# Patient Record
Sex: Female | Born: 1955
Health system: Southern US, Community
[De-identification: ages and names within clinical notes are randomized; demographics above are authoritative.]

## PROBLEM LIST (undated history)

## (undated) DIAGNOSIS — I4891 Unspecified atrial fibrillation: Secondary | ICD-10-CM

## (undated) DIAGNOSIS — N289 Disorder of kidney and ureter, unspecified: Secondary | ICD-10-CM

## (undated) DIAGNOSIS — I319 Disease of pericardium, unspecified: Secondary | ICD-10-CM

## (undated) DIAGNOSIS — K5900 Constipation, unspecified: Secondary | ICD-10-CM

## (undated) DIAGNOSIS — M25475 Effusion, left foot: Secondary | ICD-10-CM

## (undated) DIAGNOSIS — K829 Disease of gallbladder, unspecified: Secondary | ICD-10-CM

## (undated) DIAGNOSIS — M25474 Effusion, right foot: Secondary | ICD-10-CM

## (undated) DIAGNOSIS — E785 Hyperlipidemia, unspecified: Secondary | ICD-10-CM

## (undated) DIAGNOSIS — M25471 Effusion, right ankle: Secondary | ICD-10-CM

## (undated) DIAGNOSIS — I2510 Atherosclerotic heart disease of native coronary artery without angina pectoris: Secondary | ICD-10-CM

## (undated) DIAGNOSIS — I340 Nonrheumatic mitral (valve) insufficiency: Secondary | ICD-10-CM

## (undated) DIAGNOSIS — I252 Old myocardial infarction: Secondary | ICD-10-CM

## (undated) DIAGNOSIS — I9789 Other postprocedural complications and disorders of the circulatory system, not elsewhere classified: Secondary | ICD-10-CM

## (undated) DIAGNOSIS — M549 Dorsalgia, unspecified: Secondary | ICD-10-CM

## (undated) DIAGNOSIS — R0602 Shortness of breath: Secondary | ICD-10-CM

## (undated) DIAGNOSIS — M1990 Unspecified osteoarthritis, unspecified site: Secondary | ICD-10-CM

## (undated) DIAGNOSIS — J189 Pneumonia, unspecified organism: Secondary | ICD-10-CM

## (undated) DIAGNOSIS — R112 Nausea with vomiting, unspecified: Secondary | ICD-10-CM

## (undated) DIAGNOSIS — E039 Hypothyroidism, unspecified: Secondary | ICD-10-CM

## (undated) DIAGNOSIS — I219 Acute myocardial infarction, unspecified: Secondary | ICD-10-CM

## (undated) DIAGNOSIS — Z9889 Other specified postprocedural states: Secondary | ICD-10-CM

## (undated) DIAGNOSIS — L930 Discoid lupus erythematosus: Secondary | ICD-10-CM

## (undated) DIAGNOSIS — I10 Essential (primary) hypertension: Secondary | ICD-10-CM

## (undated) DIAGNOSIS — R5383 Other fatigue: Secondary | ICD-10-CM

## (undated) DIAGNOSIS — I080 Rheumatic disorders of both mitral and aortic valves: Secondary | ICD-10-CM

## (undated) DIAGNOSIS — D693 Immune thrombocytopenic purpura: Secondary | ICD-10-CM

## (undated) DIAGNOSIS — K219 Gastro-esophageal reflux disease without esophagitis: Secondary | ICD-10-CM

## (undated) DIAGNOSIS — M2550 Pain in unspecified joint: Secondary | ICD-10-CM

## (undated) DIAGNOSIS — I5030 Unspecified diastolic (congestive) heart failure: Secondary | ICD-10-CM

## (undated) DIAGNOSIS — G4730 Sleep apnea, unspecified: Secondary | ICD-10-CM

## (undated) DIAGNOSIS — M25472 Effusion, left ankle: Secondary | ICD-10-CM

## (undated) DIAGNOSIS — R079 Chest pain, unspecified: Secondary | ICD-10-CM

## (undated) DIAGNOSIS — R7303 Prediabetes: Secondary | ICD-10-CM

## (undated) HISTORY — DX: Essential (primary) hypertension: I10

## (undated) HISTORY — DX: Effusion, right ankle: M25.471

## (undated) HISTORY — DX: Effusion, left foot: M25.475

## (undated) HISTORY — DX: Hyperlipidemia, unspecified: E78.5

## (undated) HISTORY — DX: Disease of gallbladder, unspecified: K82.9

## (undated) HISTORY — DX: Rheumatic disorders of both mitral and aortic valves: I08.0

## (undated) HISTORY — DX: Dorsalgia, unspecified: M54.9

## (undated) HISTORY — DX: Gastro-esophageal reflux disease without esophagitis: K21.9

## (undated) HISTORY — DX: Pain in unspecified joint: M25.50

## (undated) HISTORY — DX: Shortness of breath: R06.02

## (undated) HISTORY — DX: Old myocardial infarction: I25.2

## (undated) HISTORY — DX: Hypothyroidism, unspecified: E03.9

## (undated) HISTORY — DX: Chest pain, unspecified: R07.9

## (undated) HISTORY — DX: Sleep apnea, unspecified: G47.30

## (undated) HISTORY — DX: Discoid lupus erythematosus: L93.0

## (undated) HISTORY — DX: Constipation, unspecified: K59.00

## (undated) HISTORY — PX: OTHER SURGICAL HISTORY: SHX169

## (undated) HISTORY — DX: Disease of pericardium, unspecified: I31.9

## (undated) HISTORY — DX: Unspecified diastolic (congestive) heart failure: I50.30

## (undated) HISTORY — DX: Disorder of kidney and ureter, unspecified: N28.9

## (undated) HISTORY — DX: Immune thrombocytopenic purpura: D69.3

## (undated) HISTORY — DX: Effusion, right foot: M25.474

## (undated) HISTORY — DX: Effusion, left ankle: M25.472

## (undated) HISTORY — DX: Other fatigue: R53.83

---

## 1999-05-01 ENCOUNTER — Other Ambulatory Visit: Admission: RE | Admit: 1999-05-01 | Discharge: 1999-05-01 | Payer: Self-pay | Admitting: Emergency Medicine

## 1999-09-13 ENCOUNTER — Emergency Department (HOSPITAL_COMMUNITY): Admission: EM | Admit: 1999-09-13 | Discharge: 1999-09-14 | Payer: Self-pay | Admitting: Emergency Medicine

## 1999-09-14 ENCOUNTER — Encounter: Payer: Self-pay | Admitting: Emergency Medicine

## 1999-09-16 ENCOUNTER — Encounter: Payer: Self-pay | Admitting: *Deleted

## 1999-09-16 ENCOUNTER — Inpatient Hospital Stay (HOSPITAL_COMMUNITY): Admission: EM | Admit: 1999-09-16 | Discharge: 1999-09-18 | Payer: Self-pay | Admitting: Emergency Medicine

## 2000-03-08 ENCOUNTER — Emergency Department (HOSPITAL_COMMUNITY): Admission: EM | Admit: 2000-03-08 | Discharge: 2000-03-09 | Payer: Self-pay | Admitting: Emergency Medicine

## 2000-06-08 ENCOUNTER — Encounter: Payer: Self-pay | Admitting: Emergency Medicine

## 2000-06-08 ENCOUNTER — Emergency Department (HOSPITAL_COMMUNITY): Admission: EM | Admit: 2000-06-08 | Discharge: 2000-06-08 | Payer: Self-pay | Admitting: Emergency Medicine

## 2001-04-10 ENCOUNTER — Emergency Department (HOSPITAL_COMMUNITY): Admission: EM | Admit: 2001-04-10 | Discharge: 2001-04-10 | Payer: Self-pay

## 2001-04-10 ENCOUNTER — Encounter: Payer: Self-pay | Admitting: Emergency Medicine

## 2001-04-11 ENCOUNTER — Inpatient Hospital Stay (HOSPITAL_COMMUNITY): Admission: EM | Admit: 2001-04-11 | Discharge: 2001-04-17 | Payer: Self-pay | Admitting: Cardiology

## 2001-04-13 ENCOUNTER — Encounter: Payer: Self-pay | Admitting: Cardiology

## 2001-04-27 ENCOUNTER — Encounter (HOSPITAL_COMMUNITY): Admission: RE | Admit: 2001-04-27 | Discharge: 2001-07-26 | Payer: Self-pay | Admitting: Family Medicine

## 2001-10-27 ENCOUNTER — Other Ambulatory Visit: Admission: RE | Admit: 2001-10-27 | Discharge: 2001-10-27 | Payer: Self-pay | Admitting: Obstetrics and Gynecology

## 2001-12-27 ENCOUNTER — Encounter: Payer: Self-pay | Admitting: Emergency Medicine

## 2001-12-27 ENCOUNTER — Inpatient Hospital Stay (HOSPITAL_COMMUNITY): Admission: EM | Admit: 2001-12-27 | Discharge: 2001-12-29 | Payer: Self-pay | Admitting: Emergency Medicine

## 2002-01-07 ENCOUNTER — Ambulatory Visit (HOSPITAL_COMMUNITY): Admission: RE | Admit: 2002-01-07 | Discharge: 2002-01-08 | Payer: Self-pay | Admitting: Cardiology

## 2002-03-16 ENCOUNTER — Inpatient Hospital Stay (HOSPITAL_COMMUNITY): Admission: EM | Admit: 2002-03-16 | Discharge: 2002-03-18 | Payer: Self-pay | Admitting: Emergency Medicine

## 2002-03-18 ENCOUNTER — Encounter (HOSPITAL_COMMUNITY): Payer: Self-pay | Admitting: Oncology

## 2002-04-19 ENCOUNTER — Observation Stay (HOSPITAL_COMMUNITY): Admission: EM | Admit: 2002-04-19 | Discharge: 2002-04-20 | Payer: Self-pay | Admitting: Internal Medicine

## 2002-05-25 ENCOUNTER — Encounter: Payer: Self-pay | Admitting: Emergency Medicine

## 2002-05-26 ENCOUNTER — Inpatient Hospital Stay (HOSPITAL_COMMUNITY): Admission: EM | Admit: 2002-05-26 | Discharge: 2002-06-04 | Payer: Self-pay | Admitting: Emergency Medicine

## 2002-05-28 ENCOUNTER — Encounter: Payer: Self-pay | Admitting: Internal Medicine

## 2002-06-03 ENCOUNTER — Encounter (INDEPENDENT_AMBULATORY_CARE_PROVIDER_SITE_OTHER): Payer: Self-pay | Admitting: Cardiology

## 2002-06-07 ENCOUNTER — Emergency Department (HOSPITAL_COMMUNITY): Admission: EM | Admit: 2002-06-07 | Discharge: 2002-06-07 | Payer: Self-pay | Admitting: Emergency Medicine

## 2002-06-07 ENCOUNTER — Encounter: Payer: Self-pay | Admitting: Emergency Medicine

## 2002-06-10 ENCOUNTER — Emergency Department (HOSPITAL_COMMUNITY): Admission: EM | Admit: 2002-06-10 | Discharge: 2002-06-11 | Payer: Self-pay | Admitting: Emergency Medicine

## 2002-06-13 ENCOUNTER — Inpatient Hospital Stay (HOSPITAL_COMMUNITY): Admission: EM | Admit: 2002-06-13 | Discharge: 2002-06-21 | Payer: Self-pay

## 2002-06-14 ENCOUNTER — Encounter: Payer: Self-pay | Admitting: Internal Medicine

## 2002-06-17 ENCOUNTER — Encounter: Payer: Self-pay | Admitting: Internal Medicine

## 2002-06-18 ENCOUNTER — Encounter: Payer: Self-pay | Admitting: Internal Medicine

## 2002-06-20 ENCOUNTER — Encounter (INDEPENDENT_AMBULATORY_CARE_PROVIDER_SITE_OTHER): Payer: Self-pay | Admitting: *Deleted

## 2002-06-20 ENCOUNTER — Encounter: Payer: Self-pay | Admitting: Pulmonary Disease

## 2002-06-21 ENCOUNTER — Encounter (INDEPENDENT_AMBULATORY_CARE_PROVIDER_SITE_OTHER): Payer: Self-pay | Admitting: Specialist

## 2002-07-23 HISTORY — PX: SPLENECTOMY: SUR1306

## 2002-08-04 ENCOUNTER — Encounter: Payer: Self-pay | Admitting: General Surgery

## 2002-08-08 ENCOUNTER — Encounter: Payer: Self-pay | Admitting: General Surgery

## 2002-08-08 ENCOUNTER — Encounter: Admission: RE | Admit: 2002-08-08 | Discharge: 2002-08-08 | Payer: Self-pay | Admitting: General Surgery

## 2002-08-09 ENCOUNTER — Ambulatory Visit (HOSPITAL_COMMUNITY): Admission: RE | Admit: 2002-08-09 | Discharge: 2002-08-09 | Payer: Self-pay | Admitting: *Deleted

## 2002-08-10 ENCOUNTER — Encounter (INDEPENDENT_AMBULATORY_CARE_PROVIDER_SITE_OTHER): Payer: Self-pay | Admitting: *Deleted

## 2002-08-10 ENCOUNTER — Inpatient Hospital Stay (HOSPITAL_COMMUNITY): Admission: RE | Admit: 2002-08-10 | Discharge: 2002-08-12 | Payer: Self-pay | Admitting: General Surgery

## 2002-09-29 ENCOUNTER — Emergency Department (HOSPITAL_COMMUNITY): Admission: EM | Admit: 2002-09-29 | Discharge: 2002-09-29 | Payer: Self-pay | Admitting: Emergency Medicine

## 2003-06-27 ENCOUNTER — Encounter: Admission: RE | Admit: 2003-06-27 | Discharge: 2003-06-27 | Payer: Self-pay | Admitting: Cardiology

## 2003-06-30 ENCOUNTER — Ambulatory Visit (HOSPITAL_COMMUNITY): Admission: RE | Admit: 2003-06-30 | Discharge: 2003-06-30 | Payer: Self-pay | Admitting: Cardiology

## 2004-02-22 ENCOUNTER — Ambulatory Visit: Payer: Self-pay | Admitting: Internal Medicine

## 2004-03-01 ENCOUNTER — Ambulatory Visit: Payer: Self-pay | Admitting: Oncology

## 2004-05-02 ENCOUNTER — Emergency Department (HOSPITAL_COMMUNITY): Admission: EM | Admit: 2004-05-02 | Discharge: 2004-05-02 | Payer: Self-pay | Admitting: Emergency Medicine

## 2004-07-22 ENCOUNTER — Ambulatory Visit: Payer: Self-pay | Admitting: Internal Medicine

## 2004-07-29 ENCOUNTER — Ambulatory Visit: Payer: Self-pay | Admitting: *Deleted

## 2004-08-22 ENCOUNTER — Encounter: Admission: RE | Admit: 2004-08-22 | Discharge: 2004-08-22 | Payer: Self-pay | Admitting: Internal Medicine

## 2004-11-07 ENCOUNTER — Encounter: Admission: RE | Admit: 2004-11-07 | Discharge: 2004-11-07 | Payer: Self-pay | Admitting: Cardiology

## 2005-05-19 ENCOUNTER — Ambulatory Visit: Payer: Self-pay | Admitting: Gastroenterology

## 2005-05-26 ENCOUNTER — Emergency Department (HOSPITAL_COMMUNITY): Admission: EM | Admit: 2005-05-26 | Discharge: 2005-05-27 | Payer: Self-pay | Admitting: Emergency Medicine

## 2005-05-26 ENCOUNTER — Ambulatory Visit: Payer: Self-pay | Admitting: Internal Medicine

## 2005-05-27 ENCOUNTER — Ambulatory Visit: Payer: Self-pay | Admitting: Gastroenterology

## 2005-05-28 ENCOUNTER — Inpatient Hospital Stay (HOSPITAL_COMMUNITY): Admission: EM | Admit: 2005-05-28 | Discharge: 2005-05-31 | Payer: Self-pay | Admitting: Emergency Medicine

## 2005-05-28 ENCOUNTER — Ambulatory Visit: Payer: Self-pay | Admitting: Internal Medicine

## 2005-05-29 ENCOUNTER — Encounter (INDEPENDENT_AMBULATORY_CARE_PROVIDER_SITE_OTHER): Payer: Self-pay | Admitting: Cardiology

## 2005-05-29 ENCOUNTER — Encounter: Payer: Self-pay | Admitting: Cardiovascular Disease

## 2005-06-05 ENCOUNTER — Ambulatory Visit: Payer: Self-pay | Admitting: Internal Medicine

## 2005-11-06 ENCOUNTER — Other Ambulatory Visit: Admission: RE | Admit: 2005-11-06 | Discharge: 2005-11-06 | Payer: Self-pay | Admitting: Obstetrics and Gynecology

## 2006-02-15 ENCOUNTER — Emergency Department (HOSPITAL_COMMUNITY): Admission: EM | Admit: 2006-02-15 | Discharge: 2006-02-15 | Payer: Self-pay | Admitting: Emergency Medicine

## 2006-03-10 ENCOUNTER — Encounter: Admission: RE | Admit: 2006-03-10 | Discharge: 2006-03-10 | Payer: Self-pay | Admitting: Obstetrics and Gynecology

## 2006-11-30 ENCOUNTER — Encounter: Payer: Self-pay | Admitting: Cardiovascular Disease

## 2006-11-30 ENCOUNTER — Observation Stay (HOSPITAL_COMMUNITY): Admission: EM | Admit: 2006-11-30 | Discharge: 2006-11-30 | Payer: Self-pay | Admitting: Emergency Medicine

## 2006-12-09 ENCOUNTER — Encounter: Payer: Self-pay | Admitting: Cardiovascular Disease

## 2007-08-05 ENCOUNTER — Ambulatory Visit: Payer: Self-pay | Admitting: Internal Medicine

## 2007-08-05 DIAGNOSIS — I82409 Acute embolism and thrombosis of unspecified deep veins of unspecified lower extremity: Secondary | ICD-10-CM | POA: Insufficient documentation

## 2007-08-05 DIAGNOSIS — D693 Immune thrombocytopenic purpura: Secondary | ICD-10-CM | POA: Insufficient documentation

## 2007-08-05 DIAGNOSIS — I319 Disease of pericardium, unspecified: Secondary | ICD-10-CM | POA: Insufficient documentation

## 2007-08-05 DIAGNOSIS — K219 Gastro-esophageal reflux disease without esophagitis: Secondary | ICD-10-CM | POA: Insufficient documentation

## 2007-08-05 DIAGNOSIS — M3214 Glomerular disease in systemic lupus erythematosus: Secondary | ICD-10-CM | POA: Insufficient documentation

## 2007-08-05 DIAGNOSIS — I739 Peripheral vascular disease, unspecified: Secondary | ICD-10-CM | POA: Insufficient documentation

## 2007-08-06 ENCOUNTER — Telehealth: Payer: Self-pay | Admitting: Family Medicine

## 2007-11-24 ENCOUNTER — Encounter: Payer: Self-pay | Admitting: Cardiovascular Disease

## 2007-11-24 ENCOUNTER — Encounter: Payer: Self-pay | Admitting: Internal Medicine

## 2007-12-02 ENCOUNTER — Encounter: Payer: Self-pay | Admitting: Internal Medicine

## 2007-12-02 ENCOUNTER — Encounter: Payer: Self-pay | Admitting: Cardiovascular Disease

## 2007-12-09 ENCOUNTER — Ambulatory Visit: Payer: Self-pay | Admitting: Internal Medicine

## 2007-12-09 DIAGNOSIS — Z9089 Acquired absence of other organs: Secondary | ICD-10-CM | POA: Insufficient documentation

## 2007-12-09 DIAGNOSIS — F17200 Nicotine dependence, unspecified, uncomplicated: Secondary | ICD-10-CM | POA: Insufficient documentation

## 2007-12-09 DIAGNOSIS — R05 Cough: Secondary | ICD-10-CM

## 2007-12-09 DIAGNOSIS — R059 Cough, unspecified: Secondary | ICD-10-CM | POA: Insufficient documentation

## 2007-12-09 LAB — CONVERTED CEMR LAB
Inflenza A Ag: NEGATIVE
Influenza B Ag: NEGATIVE

## 2007-12-10 ENCOUNTER — Encounter: Payer: Self-pay | Admitting: Internal Medicine

## 2008-01-21 ENCOUNTER — Ambulatory Visit: Payer: Self-pay | Admitting: Internal Medicine

## 2008-03-03 ENCOUNTER — Ambulatory Visit: Payer: Self-pay | Admitting: Internal Medicine

## 2008-03-06 ENCOUNTER — Ambulatory Visit: Payer: Self-pay | Admitting: Internal Medicine

## 2008-03-06 LAB — CONVERTED CEMR LAB
BUN: 19 mg/dL (ref 6–23)
CO2: 29 meq/L (ref 19–32)
Calcium: 9.1 mg/dL (ref 8.4–10.5)
Chloride: 109 meq/L (ref 96–112)
Creatinine, Ser: 1 mg/dL (ref 0.4–1.2)
GFR calc Af Amer: 75 mL/min
GFR calc non Af Amer: 62 mL/min
Glucose, Bld: 89 mg/dL (ref 70–99)
Potassium: 4.7 meq/L (ref 3.5–5.1)
Sodium: 143 meq/L (ref 135–145)

## 2008-03-08 ENCOUNTER — Telehealth (INDEPENDENT_AMBULATORY_CARE_PROVIDER_SITE_OTHER): Payer: Self-pay | Admitting: *Deleted

## 2008-03-09 ENCOUNTER — Encounter: Admission: RE | Admit: 2008-03-09 | Discharge: 2008-03-09 | Payer: Self-pay | Admitting: Internal Medicine

## 2008-03-24 HISTORY — PX: CHOLECYSTECTOMY, LAPAROSCOPIC: SHX56

## 2008-05-11 ENCOUNTER — Telehealth: Payer: Self-pay | Admitting: Internal Medicine

## 2008-05-12 ENCOUNTER — Ambulatory Visit: Payer: Self-pay | Admitting: Endocrinology

## 2008-07-11 ENCOUNTER — Telehealth: Payer: Self-pay | Admitting: Internal Medicine

## 2008-08-16 ENCOUNTER — Ambulatory Visit: Payer: Self-pay | Admitting: Internal Medicine

## 2008-08-28 ENCOUNTER — Ambulatory Visit: Payer: Self-pay | Admitting: Internal Medicine

## 2008-09-13 ENCOUNTER — Telehealth: Payer: Self-pay | Admitting: Internal Medicine

## 2008-09-20 ENCOUNTER — Encounter: Payer: Self-pay | Admitting: Internal Medicine

## 2008-09-26 ENCOUNTER — Telehealth (INDEPENDENT_AMBULATORY_CARE_PROVIDER_SITE_OTHER): Payer: Self-pay | Admitting: *Deleted

## 2008-10-24 ENCOUNTER — Ambulatory Visit: Payer: Self-pay | Admitting: Internal Medicine

## 2008-10-24 DIAGNOSIS — D259 Leiomyoma of uterus, unspecified: Secondary | ICD-10-CM | POA: Insufficient documentation

## 2008-12-14 ENCOUNTER — Telehealth: Payer: Self-pay | Admitting: Internal Medicine

## 2009-01-05 ENCOUNTER — Ambulatory Visit (HOSPITAL_COMMUNITY): Admission: RE | Admit: 2009-01-05 | Discharge: 2009-01-05 | Payer: Self-pay | Admitting: Internal Medicine

## 2009-01-16 ENCOUNTER — Ambulatory Visit: Payer: Self-pay | Admitting: Internal Medicine

## 2009-01-23 ENCOUNTER — Telehealth: Payer: Self-pay | Admitting: Internal Medicine

## 2009-01-23 ENCOUNTER — Encounter: Payer: Self-pay | Admitting: Internal Medicine

## 2009-01-26 ENCOUNTER — Ambulatory Visit: Payer: Self-pay | Admitting: Cardiovascular Disease

## 2009-01-26 DIAGNOSIS — E785 Hyperlipidemia, unspecified: Secondary | ICD-10-CM | POA: Insufficient documentation

## 2009-01-26 DIAGNOSIS — E782 Mixed hyperlipidemia: Secondary | ICD-10-CM | POA: Insufficient documentation

## 2009-02-13 ENCOUNTER — Encounter: Payer: Self-pay | Admitting: Internal Medicine

## 2009-02-16 ENCOUNTER — Encounter: Payer: Self-pay | Admitting: Cardiovascular Disease

## 2009-02-16 ENCOUNTER — Ambulatory Visit: Payer: Self-pay

## 2009-02-16 ENCOUNTER — Ambulatory Visit: Payer: Self-pay | Admitting: Internal Medicine

## 2009-02-16 ENCOUNTER — Ambulatory Visit (HOSPITAL_COMMUNITY): Admission: RE | Admit: 2009-02-16 | Discharge: 2009-02-16 | Payer: Self-pay | Admitting: Cardiovascular Disease

## 2009-02-21 ENCOUNTER — Encounter: Payer: Self-pay | Admitting: Cardiovascular Disease

## 2009-02-26 ENCOUNTER — Ambulatory Visit: Payer: Self-pay | Admitting: Cardiovascular Disease

## 2009-02-26 DIAGNOSIS — I25709 Atherosclerosis of coronary artery bypass graft(s), unspecified, with unspecified angina pectoris: Secondary | ICD-10-CM | POA: Insufficient documentation

## 2009-02-26 DIAGNOSIS — I059 Rheumatic mitral valve disease, unspecified: Secondary | ICD-10-CM | POA: Insufficient documentation

## 2009-02-26 LAB — CONVERTED CEMR LAB
ALT: 13 units/L (ref 0–35)
AST: 19 units/L (ref 0–37)
Albumin: 3.4 g/dL — ABNORMAL LOW (ref 3.5–5.2)
Alkaline Phosphatase: 83 units/L (ref 39–117)
Bilirubin, Direct: 0 mg/dL (ref 0.0–0.3)
Cholesterol: 150 mg/dL (ref 0–200)
HDL: 39.4 mg/dL (ref >39.00)
LDL Cholesterol: 94 mg/dL (ref 0–99)
Total Bilirubin: 0.5 mg/dL (ref 0.3–1.2)
Total CHOL/HDL Ratio: 4
Total CK: 94 units/L (ref 7–177)
Total Protein: 6.8 g/dL (ref 6.0–8.3)
Triglycerides: 83 mg/dL (ref 0.0–149.0)
VLDL: 16.6 mg/dL (ref 0.0–40.0)

## 2009-03-02 ENCOUNTER — Ambulatory Visit (HOSPITAL_COMMUNITY): Admission: RE | Admit: 2009-03-02 | Discharge: 2009-03-03 | Payer: Self-pay | Admitting: General Surgery

## 2009-03-02 ENCOUNTER — Encounter (INDEPENDENT_AMBULATORY_CARE_PROVIDER_SITE_OTHER): Payer: Self-pay | Admitting: General Surgery

## 2009-03-08 ENCOUNTER — Encounter: Payer: Self-pay | Admitting: Internal Medicine

## 2009-03-27 ENCOUNTER — Telehealth (INDEPENDENT_AMBULATORY_CARE_PROVIDER_SITE_OTHER): Payer: Self-pay | Admitting: *Deleted

## 2009-03-28 ENCOUNTER — Telehealth (INDEPENDENT_AMBULATORY_CARE_PROVIDER_SITE_OTHER): Payer: Self-pay | Admitting: *Deleted

## 2009-04-23 ENCOUNTER — Telehealth: Payer: Self-pay | Admitting: Internal Medicine

## 2009-05-19 ENCOUNTER — Emergency Department (HOSPITAL_COMMUNITY): Admission: EM | Admit: 2009-05-19 | Discharge: 2009-05-19 | Payer: Self-pay | Admitting: Emergency Medicine

## 2009-05-29 ENCOUNTER — Ambulatory Visit: Payer: Self-pay | Admitting: Internal Medicine

## 2009-05-29 DIAGNOSIS — J019 Acute sinusitis, unspecified: Secondary | ICD-10-CM | POA: Insufficient documentation

## 2009-05-29 DIAGNOSIS — B9689 Other specified bacterial agents as the cause of diseases classified elsewhere: Secondary | ICD-10-CM | POA: Insufficient documentation

## 2009-05-29 DIAGNOSIS — N390 Urinary tract infection, site not specified: Secondary | ICD-10-CM | POA: Insufficient documentation

## 2009-05-29 LAB — CONVERTED CEMR LAB
Bilirubin Urine: NEGATIVE
Hemoglobin, Urine: NEGATIVE
Leukocytes, UA: NEGATIVE
Nitrite: NEGATIVE
Specific Gravity, Urine: 1.015 (ref 1.000–1.030)
Total Protein, Urine: NEGATIVE mg/dL
Urine Glucose: NEGATIVE mg/dL
Urobilinogen, UA: 0.2 (ref 0.0–1.0)
pH: 6.5 (ref 5.0–8.0)

## 2009-06-21 ENCOUNTER — Telehealth (INDEPENDENT_AMBULATORY_CARE_PROVIDER_SITE_OTHER): Payer: Self-pay | Admitting: *Deleted

## 2009-06-26 ENCOUNTER — Ambulatory Visit: Payer: Self-pay | Admitting: Internal Medicine

## 2009-08-01 ENCOUNTER — Telehealth: Payer: Self-pay | Admitting: Internal Medicine

## 2009-09-12 ENCOUNTER — Telehealth: Payer: Self-pay | Admitting: Internal Medicine

## 2009-09-20 ENCOUNTER — Ambulatory Visit: Payer: Self-pay | Admitting: Cardiovascular Disease

## 2009-09-21 LAB — CONVERTED CEMR LAB
ALT: 16 units/L (ref 0–35)
AST: 22 units/L (ref 0–37)
Albumin: 3.5 g/dL (ref 3.5–5.2)
Alkaline Phosphatase: 82 units/L (ref 39–117)
Bilirubin, Direct: 0.1 mg/dL (ref 0.0–0.3)
Total Bilirubin: 0.2 mg/dL — ABNORMAL LOW (ref 0.3–1.2)
Total Protein: 7.1 g/dL (ref 6.0–8.3)

## 2010-02-26 ENCOUNTER — Encounter: Payer: Self-pay | Admitting: Cardiovascular Disease

## 2010-02-26 ENCOUNTER — Ambulatory Visit: Payer: Self-pay | Admitting: Cardiovascular Disease

## 2010-02-27 LAB — CONVERTED CEMR LAB
ALT: 17 units/L (ref 0–35)
AST: 22 units/L (ref 0–37)
Albumin: 3.4 g/dL — ABNORMAL LOW (ref 3.5–5.2)
Alkaline Phosphatase: 77 units/L (ref 39–117)
Bilirubin, Direct: 0 mg/dL (ref 0.0–0.3)
Cholesterol: 137 mg/dL (ref 0–200)
HDL: 44.1 mg/dL (ref >39.00)
LDL Cholesterol: 82 mg/dL (ref 0–99)
Total Bilirubin: 0.3 mg/dL (ref 0.3–1.2)
Total CHOL/HDL Ratio: 3
Total Protein: 6.5 g/dL (ref 6.0–8.3)
Triglycerides: 55 mg/dL (ref 0.0–149.0)
VLDL: 11 mg/dL (ref 0.0–40.0)

## 2010-04-03 ENCOUNTER — Ambulatory Visit: Admit: 2010-04-03 | Payer: Self-pay

## 2010-04-03 ENCOUNTER — Ambulatory Visit (HOSPITAL_COMMUNITY): Admission: RE | Admit: 2010-04-03 | Payer: Self-pay | Source: Home / Self Care | Admitting: Cardiovascular Disease

## 2010-04-14 ENCOUNTER — Encounter: Payer: Self-pay | Admitting: Obstetrics and Gynecology

## 2010-04-14 ENCOUNTER — Encounter (HOSPITAL_COMMUNITY): Payer: Self-pay | Admitting: Oncology

## 2010-04-23 NOTE — Progress Notes (Signed)
  Phone Note Refill Request Message from:  Fax from Pharmacy on September 12, 2009 3:14 PM  Refills Requested: Medication #1:  PROTONIX 40 MG  TBEC Take 1 tablet by mouth once a day Initial call taken by: Ami Bullins CMA,  September 12, 2009 3:14 PM    Prescriptions: PROTONIX 40 MG  TBEC (PANTOPRAZOLE SODIUM) Take 1 tablet by mouth once a day  #30 Tablet x 3   Entered by:   Ami Bullins CMA   Authorized by:   Jacques Navy MD   Signed by:   Bill Salinas CMA on 09/12/2009   Method used:   [facility]  [address] [id number]       [address]       [place], [place]  [address]       Ph: [phone]       Fax: [phone]   RxID:   [id number]

## 2010-04-23 NOTE — Progress Notes (Signed)
  Phone Note Call from Patient Call back at [phone] cell   Caller: Patient Summary of Call: paged at 6:17 pm on 08-05-07 about meds. Was expecting a refill on her Prednisone to be called to the pharmacy but it was not. I told her to contact Dr. Debby Bud in the am. Initial call taken by: Nelwyn Salisbury MD,  Aug 06, 2007 8:22 AM

## 2010-04-23 NOTE — Progress Notes (Signed)
   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 [name]  June 21, 2009 3:39 PM    Appended Document:  Recieved back today paper from [facility] sent back to [facility].Marland KitchenMarland Kitchen

## 2010-04-23 NOTE — Progress Notes (Signed)
Summary: appt  Phone Note Call from Patient Call back at Home Phone [phone]   Caller: Patient Summary of Call: Patient called c/o congestion and said that Dr Debby Bud told her that she does not have a spleen and need to always be seen. Pt added to schedule fot 05/12/08 Initial call taken by: Rock Nephew CMA,  May 11, 2008 2:50 PM

## 2010-04-23 NOTE — Progress Notes (Signed)
Summary: Request for MD to complete [employer] questionaire  Request for MD to complete [employer] questionaire.Forwarded to Dr. Clifton James. Korinne Greenstein  June 21, 2009 10:34 AM  Appended Document: Request for MD to complete [employer] questionaire Request for MD to complete [employer] questionaire. Forwarded to Dr. Debby Bud.

## 2010-04-23 NOTE — Progress Notes (Signed)
Summary: [facility]  Request for records received from [facility]. Request forwarded to [facility]. Dena Petti  March 27, 2009 2:48 PM

## 2010-04-23 NOTE — Progress Notes (Signed)
Summary: levothyroxine  Phone Note Refill Request Message from:  Fax from Pharmacy on September 13, 2008 4:24 PM  Refills Requested: Medication #1:  LEVOTHYROXINE SODIUM 50 MCG TABS Take 1 tablet by mouth once a day   [facility]: [facility] [phone]   Method Requested: Electronic Initial call taken by: Orlan Leavens,  September 13, 2008 4:24 PM      Prescriptions: LEVOTHYROXINE SODIUM 50 MCG TABS (LEVOTHYROXINE SODIUM) Take 1 tablet by mouth once a day  #30 x 1   Entered by:   Orlan Leavens   Authorized by:   Jacques Navy MD   Signed by:   Orlan Leavens on 09/13/2008   Method used:   [facility]        [facility]  [address] [phone][facility] (retail[facility]       [address]       [place], [place]  [address]       Ph: [phone]       Fax: [phone]   RxID:   [id number]

## 2010-04-23 NOTE — Assessment & Plan Note (Signed)
Summary: PER CHECK OUT/SF  Medications Added CRESTOR 10 MG TABS (ROSUVASTATIN CALCIUM) Take one tablet by mouth daily.        Visit Type:  6 mo f/u Primary Provider:  Jacques Navy MD  CC:  edema/ankles at times....no cp or sob.  History of Present Illness: 55 yo AAF with h/o CAD s/p bare metal stent RCA and Circumflex 2003, PTCA Diagonal 2003, hypothyroidism, moderate MR, GERD here today for cardiac follow up. She had previously been followed in [facility] clinic by Dr. Elsie Lincoln.  I saw her as a new patient in November 2010. She has had some chest pain at times since her stents were placed in 2003.Chest pain has been much less often and less intense.   She denies near syncope or syncope. She has changed her diet to fruits and vegetables. She has been exercising three times per week. No SOB with exertion. She has almost totally stopped smoking. Some mild edema ankles. She recently stopped Zocor because of muscle aches.   Her echo in November 2010 showed normal LV size and function.  There was mild to moderate TR and MR. Lower ext dopplers with no evidence of PAD.   Current Medications (verified): 1)  Atenolol 25 Mg  Tabs (Atenolol) .... Take 1 Tablet By Mouth Once A Day 2)  Protonix 40 Mg  Tbec (Pantoprazole Sodium) .... Take 1 Tablet By Mouth Once A Day 3)  Nitroglycerin 0.4 Mg  Subl (Nitroglycerin) .... As Needed 4)  Levothyroxine Sodium 50 Mcg Tabs (Levothyroxine Sodium) .... Take 1 Tablet By Mouth Once A Day 5)  Aspirin 325 Mg Tabs (Aspirin) .... One By Mouth Daily  Allergies: 1)  ! Ace Inhibitors  Past History:  Past Medical History: Reviewed history from 01/26/2009 and no changes required. Mixed connective tissue disorder/SLE Hx of DVT (ICD-453.40) '04 PVD (ICD-443.9) GERD (ICD-530.81) MITRAL REGURGITATION, 2-3 PLUS (ICD-396.3) Hx of IMMUNE THROMBOCYTOPENIC PURPURA  '04 Hx of PERICARDITIS (ICD-423.9) CORONARY ARTERY DISEASE, S/P PTCA (ICD-414.9) with stents  in mid Circumflex and mid RCA in 2003, and balloon angioplasty of Diagonal branch October 2003,  last cath Sept '08 2 vessel disease with patent stents LUPUS ERYTHEMATOSUS (ICD-695.4) Hypothyroidism    Physician Roster:           Card- Dr. Elsie Lincoln           Rheum - Dr. Link Snuffer           Heme - Dr. Arline Asp  Social History: Reviewed history from 02/26/2009 and no changes required. In college [name] ('09)-Undergraduate Religious Studies , graduated from [employer] in [place] state. Marrried '73 - 2 years/ divorced. 2 son- '73- CP, '75;  Occupation:administrator for nonprofit, part-time student Pt is a smoker-1/2 pack per day x 30 years No illlicit drug use No etoh  Review of Systems       The patient complains of chest pain and shortness of breath.  The patient denies fatigue, malaise, fever, weight gain/loss, vision loss, decreased hearing, hoarseness, palpitations, prolonged cough, wheezing, sleep apnea, coughing up blood, abdominal pain, blood in stool, nausea, vomiting, diarrhea, heartburn, incontinence, blood in urine, muscle weakness, joint pain, leg swelling, rash, skin lesions, headache, fainting, dizziness, depression, anxiety, enlarged lymph nodes, easy bruising or bleeding, and environmental allergies.    Vital Signs:  Patient profile:   55 year old female Height:      67 inches Weight:      213 pounds BMI:     33.48 Pulse rate:  76 / minute Pulse rhythm:   regular BP sitting:   120 / 80  (left arm) Cuff size:   large  Vitals Entered By: Danielle Rankin, CMA (September 20, 2009 3:38 PM)  Physical Exam  General:  General: Well developed, well nourished, NAD Musculoskeletal: Muscle strength 5/5 all ext Psychiatric: Mood and affect normal Neck: No JVD, no carotid bruits, no thyromegaly, no lymphadenopathy. Lungs:Clear bilaterally, no wheezes, rhonci, crackles CV: RRR no murmurs, gallops rubs Abdomen: soft, NT, ND, BS present Extremities: No edema, pulses  2+.    Impression & Recommendations:  Problem # 1:  CORONARY ATHEROSCLEROSIS NATIVE CORONARY ARTERY (ICD-414.01) Stable. Will continue ASA, beta blocker. Will start Crestor 10 mg by mouth at bedtime. I will give her samples. If she tolerates, will call in refill. Check LFTs today. Repeat LFTS and fasting lipids in 12 weeks.   Her updated medication list for this problem includes:    Atenolol 25 Mg Tabs (Atenolol) .Marland Kitchen... Take 1 tablet by mouth once a day    Nitroglycerin 0.4 Mg Subl (Nitroglycerin) .Marland Kitchen... As needed    Aspirin 325 Mg Tabs (Aspirin) ..... One by mouth daily  Her updated medication list for this problem includes:    Atenolol 25 Mg Tabs (Atenolol) .Marland Kitchen... Take 1 tablet by mouth once a day    Nitroglycerin 0.4 Mg Subl (Nitroglycerin) .Marland Kitchen... As needed    Aspirin 325 Mg Tabs (Aspirin) ..... One by mouth daily  Problem # 2:  MITRAL VALVE DISORDERS (ICD-424.0) Repeat echo in 6 months. Will order at next visit.   Her updated medication list for this problem includes:    Atenolol 25 Mg Tabs (Atenolol) .Marland Kitchen... Take 1 tablet by mouth once a day    Nitroglycerin 0.4 Mg Subl (Nitroglycerin) .Marland Kitchen... As needed  Her updated medication list for this problem includes:    Atenolol 25 Mg Tabs (Atenolol) .Marland Kitchen... Take 1 tablet by mouth once a day    Nitroglycerin 0.4 Mg Subl (Nitroglycerin) .Marland Kitchen... As needed  Problem # 3:  PURE HYPERCHOLESTEROLEMIA (ICD-272.0) As above. Start Crestor and check lipids in 12 weeks.   The following medications were removed from the medication list:    Simvastatin 40 Mg Tabs (Simvastatin) .Marland Kitchen... Take one tablet by mouth daily at bedtime Her updated medication list for this problem includes:    Crestor 10 Mg Tabs (Rosuvastatin calcium) .Marland Kitchen... Take one tablet by mouth daily.  The following medications were removed from the medication list:    Simvastatin 40 Mg Tabs (Simvastatin) .Marland Kitchen... Take one tablet by mouth daily at bedtime Her updated medication list for this problem  includes:    Crestor 10 Mg Tabs (Rosuvastatin calcium) .Marland Kitchen... Take one tablet by mouth daily.  Other Orders: TLB-Hepatic/Liver Function Pnl (80076-HEPATIC)  Patient Instructions: 1)  Your physician recommends that you schedule a follow-up appointment in: 6 months 2)  Your physician recommends that you return for a FASTING lipid profile and liver profile in 12 weeks 3)  Your physician has recommended you make the following change in your medication: Start crestor 10 mg by mouth daily Prescriptions: CRESTOR 10 MG TABS (ROSUVASTATIN CALCIUM) Take one tablet by mouth daily.  #30 x 11   Entered by:   Dossie Arbour, RN, BSN   Authorized by:   Verne Carrow, MD   Signed by:   Dossie Arbour, RN, BSN on 09/20/2009   Method used:   [facility]        [facility]  [address] [phone][facility] (retail[facility]  [address]       [place], [place]  [address]       Ph: [phone]       Fax: [phone]   RxID:   [id number]

## 2010-04-23 NOTE — Letter (Signed)
Summary: Results Follow-up Letter  [facility]  [address] [place], [place] [address]   Phone: [phone]  Fax: [phone]    12/10/2007  [address] [place], [place]  [address]  Dear Ms. Primeau,   The following are the results of your recent test(s):  Test     Result     Chest Xray        Normal   _________________________________________________________  Please call for an appointment if needed _________________________________________________________ _________________________________________________________ _________________________________________________________  Sincerely,  Sanda Linger MD [facility]

## 2010-04-23 NOTE — Assessment & Plan Note (Signed)
Summary: post wes long er/told to f/u asap after completion of med for...   Vital Signs:  Patient profile:   55 year old female Height:      67 inches (170.18 cm) Weight:      210.50 pounds (95.68 kg) BMI:     33.09 O2 Sat:      99 % on Room air Temp:     98.3 degrees F (36.83 degrees C) oral Pulse rate:   77 / minute Pulse rhythm:   regular Resp:     16 per minute BP sitting:   130 / 82  (left arm) Cuff size:   large  Vitals Entered By: Brenton Grills (May 29, 2009 10:26 AM)  Nutrition Counseling: Patient's BMI is greater than 25 and therefore counseled on weight management options.  O2 Flow:  Room air CC: post  hosp f/u [facility] Patience, URI symptoms   Primary Care Provider:  Jacques Navy MD  CC:  post  hosp f/u Rickard Patience and URI symptoms.  History of Present Illness: New to me she needs a f/up after being treated for right pyelonephritis at the hospital with Cipro.  She also complains of sinus congestion.  URI Symptoms      This is a 55 year old woman who presents with URI symptoms.  The symptoms began 2 weeks ago.  The severity is described as moderate.  The patient reports nasal congestion and purulent nasal discharge, but denies sore throat, dry cough, productive cough, earache, and sick contacts.  The patient denies fever, stiff neck, dyspnea, wheezing, rash, vomiting, diarrhea, use of an antipyretic, and response to antipyretic.  The patient denies seasonal symptoms, headache, muscle aches, and severe fatigue.  Risk factors for Strep sinusitis include unilateral facial pain, unilateral nasal discharge, poor response to decongestant, double sickening, and absence of cough.  The patient denies the following risk factors for Strep sinusitis: tooth pain, Strep exposure, and tender adenopathy.    Preventive Screening-Counseling & Management  Alcohol-Tobacco     Alcohol drinks/day: 0     Smoking Status: current     Smoking Cessation Counseling: yes     Smoke  Cessation Stage: precontemplative     Packs/Day: 1     Passive Smoke Exposure: no  Hep-HIV-STD-Contraception     Hepatitis Risk: no risk noted     HIV Risk: no     STD Risk: no risk noted      Drug Use:  no.    Medications Prior to Update: 1)  Altace 1.25 Mg  Tabs (Ramipril) .... Take 1 Tablet By Mouth Once A Day 2)  Atenolol 25 Mg  Tabs (Atenolol) .... Take 1 Tablet By Mouth Once A Day 3)  Protonix 40 Mg  Tbec (Pantoprazole Sodium) .... Take 1 Tablet By Mouth Once A Day 4)  Simvastatin 40 Mg Tabs (Simvastatin) .... Take One Tablet By Mouth Daily At Bedtime 5)  Nitroglycerin 0.4 Mg  Subl (Nitroglycerin) .... As Needed 6)  Levothyroxine Sodium 50 Mcg Tabs (Levothyroxine Sodium) .... Take 1 Tablet By Mouth Once A Day 7)  Aspirin 325 Mg Tabs (Aspirin) .... One By Mouth Daily  Current Medications (verified): 1)  Atenolol 25 Mg  Tabs (Atenolol) .... Take 1 Tablet By Mouth Once A Day 2)  Protonix 40 Mg  Tbec (Pantoprazole Sodium) .... Take 1 Tablet By Mouth Once A Day 3)  Simvastatin 40 Mg Tabs (Simvastatin) .... Take One Tablet By Mouth Daily At Bedtime 4)  Nitroglycerin 0.4  Mg  Subl (Nitroglycerin) .... As Needed 5)  Levothyroxine Sodium 50 Mcg Tabs (Levothyroxine Sodium) .... Take 1 Tablet By Mouth Once A Day 6)  Aspirin 325 Mg Tabs (Aspirin) .... One By Mouth Daily 7)  Amoxicillin 500 Mg Cap (Amoxicillin) .... Take 1 Capsule By Mouth Three Times A Day X 10 Days  Allergies (verified): 1)  ! Ace Inhibitors  Past History:  Past Medical History: Reviewed history from 01/26/2009 and no changes required. Mixed connective tissue disorder/SLE Hx of DVT (ICD-453.40) '04 PVD (ICD-443.9) GERD (ICD-530.81) MITRAL REGURGITATION, 2-3 PLUS (ICD-396.3) Hx of IMMUNE THROMBOCYTOPENIC PURPURA  '04 Hx of PERICARDITIS (ICD-423.9) CORONARY ARTERY DISEASE, S/P PTCA (ICD-414.9) with stents in mid Circumflex and mid RCA in 2003, and balloon angioplasty of Diagonal branch October 2003,  last cath Sept  '08 2 vessel disease with patent stents LUPUS ERYTHEMATOSUS (ICD-695.4) Hypothyroidism    Physician Roster:           Card- Dr. Elsie Lincoln           Rheum - Dr. Link Snuffer           Heme - Dr. Arline Asp  Past Surgical History: Reviewed history from 08/16/2008 and no changes required. plastic surgical repair dog bite to leg splenectomy (ITP) 5/04 G2P2  Family History: Reviewed history from 01/26/2009 and no changes required. mother- breast cancer, cervical cancer Father alive at 56 Grandmother died from MI PGF-colon cancer Pos - CAD, DM, HTN, Lipids  Social History: Reviewed history from 02/26/2009 and no changes required. In college [name] ('09)-Undergraduate Religious Studies , graduated from [employer] in [place]. Marrried '73 - 2 years/ divorced. 2 son- '73- CP, '75;  Occupation:administrator for nonprofit, part-time student Pt is a smoker-1/2 pack per day x 30 days No illlicit drug use No etoh   Hepatitis Risk:  no risk noted STD Risk:  no risk noted  Review of Systems       The patient complains of prolonged cough.  The patient denies anorexia, fever, weight loss, weight gain, hoarseness, chest pain, dyspnea on exertion, peripheral edema, headaches, hemoptysis, abdominal pain, melena, hematochezia, severe indigestion/heartburn, hematuria, incontinence, genital sores, and suspicious skin lesions.   Resp:  Complains of cough; denies coughing up blood, excessive snoring, hypersomnolence, morning headaches, pleuritic, shortness of breath, sputum productive, and wheezing. GU:  Denies abnormal vaginal bleeding, discharge, dysuria, hematuria, nocturia, urinary frequency, and urinary hesitancy.  Physical Exam  General:  alert, well-developed, well-nourished, well-hydrated, appropriate dress, normal appearance, healthy-appearing, and overweight-appearing.   Head:  normocephalic, atraumatic, no abnormalities observed, and no abnormalities palpated.   Ears:  R ear  normal and L ear normal.   Nose:  no airflow obstruction, no intranasal foreign body, no nasal polyps, no nasal mucosal lesions, no mucosal friability, no active bleeding or clots, no septum abnormalities, nasal dischargemucosal pallor, L maxillary sinus tenderness, and R maxillary sinus tenderness.   Mouth:  Oral mucosa and oropharynx without lesions or exudates.  Teeth in good repair. Neck:  No deformities, masses, or tenderness noted. Chest Wall:  No deformities, masses, or tenderness noted. Lungs:  normal respiratory effort, no intercostal retractions, no accessory muscle use, normal breath sounds, no dullness, no fremitus, no crackles, and no wheezes.   Heart:  normal rate, regular rhythm, no murmur, no gallop, no rub, and no JVD.   Abdomen:  soft, non-tender, normal bowel sounds, no distention, no masses, no guarding, no rigidity, no hepatomegaly, and no splenomegaly.  No CVAT. Msk:  No deformity or scoliosis  noted of thoracic or lumbar spine.   Pulses:  R and L carotid,radial,femoral,dorsalis pedis and posterior tibial pulses are full and equal bilaterally Extremities:  No clubbing, cyanosis, edema, or deformity noted with normal full range of motion of all joints.   Neurologic:  No cranial nerve deficits noted. Station and gait are normal. Plantar reflexes are down-going bilaterally. DTRs are symmetrical throughout. Sensory, motor and coordinative functions appear intact. Skin:  Intact without suspicious lesions or rashes Cervical Nodes:  no anterior cervical adenopathy and no posterior cervical adenopathy.   Axillary Nodes:  no R axillary adenopathy and no L axillary adenopathy.   Psych:  Cognition and judgment appear intact. Alert and cooperative with normal attention span and concentration. No apparent delusions, illusions, hallucinations   Impression & Recommendations:  Problem # 1:  UTI (ICD-599.0) Assessment Improved  Orders: T-Urine Culture (Spectrum Order)  ([id number]) TLB-Udip w/ Micro (81001-URINE)  Encouraged to push clear liquids, get enough rest, and take acetaminophen as needed. To be seen in 10 days if no improvement, sooner if worse.  Problem # 2:  SINUSITIS- ACUTE-NOS (ICD-461.9) Assessment: New  Her updated medication list for this problem includes:    Amoxicillin 500 Mg Cap (Amoxicillin) .Marland Kitchen... Take 1 capsule by mouth three times a day x 10 days  Instructed on treatment. Call if symptoms persist or worsen.   Orders: Tobacco use cessation intermediate 3-10 minutes ([address]11)  Problem # 3:  COUGH (ICD-786.2) Assessment: Deteriorated  stop the ACEI  Orders: Tobacco use cessation intermediate 3-10 minutes ([address])  Complete Medication List: 1)  Atenolol 25 Mg Tabs (Atenolol) .... Take 1 tablet by mouth once a day 2)  Protonix 40 Mg Tbec (Pantoprazole sodium) .... Take 1 tablet by mouth once a day 3)  Simvastatin 40 Mg Tabs (Simvastatin) .... Take one tablet by mouth daily at bedtime 4)  Nitroglycerin 0.4 Mg Subl (Nitroglycerin) .... As needed 5)  Levothyroxine Sodium 50 Mcg Tabs (Levothyroxine sodium) .... Take 1 tablet by mouth once a day 6)  Aspirin 325 Mg Tabs (Aspirin) .... One by mouth daily 7)  Amoxicillin 500 Mg Cap (Amoxicillin) .... Take 1 capsule by mouth three times a day x 10 days  Patient Instructions: 1)  Please schedule a follow-up appointment in 2 weeks. 2)  It is important that you exercise regularly at least 20 minutes 5 times a week. If you develop chest pain, have severe difficulty breathing, or feel very tired , stop exercising immediately and seek medical attention. 3)  You need to lose weight. Consider a lower calorie diet and regular exercise.  4)  Check your Blood Pressure regularly. If it is above 140/90: you should make an appointment. 5)  Take your antibiotic as prescribed until ALL of it is gone, but stop if you develop a rash or swelling and contact our office as soon as possible. 6)  Acute  sinusitis symptoms for less than 10 days are not helped by antibiotics.Use warm moist compresses, and over the counter decongestants ( only as directed). Call if no improvement in 5-7 days, sooner if increasing pain, fever, or new symptoms. Prescriptions: AMOXICILLIN 500 MG CAP (AMOXICILLIN) Take 1 capsule by mouth three times a day X 10 days  #30 x 1   Entered and Authorized by:   Etta Grandchild MD   Signed by:   Etta Grandchild MD on 05/29/2009   Method used:   [facility]        [facility]  [address] [phone][facility] (  retail[facility]       [address]       [place], [place]  [address]       Ph: [phone]       Fax: [phone]   RxID:   [id number]

## 2010-04-23 NOTE — Assessment & Plan Note (Signed)
Summary: fever/cough FLU RM SD   Vital Signs:  Patient Profile:   55 Years Old Female Height:     67 inches Temp:     97.3 degrees F oral Pulse rate:   64 / minute Pulse rhythm:   regular BP sitting:   104 / 72  (left arm) Cuff size:   regular  Vitals Entered By: Rock Nephew CMA (December 09, 2007 10:26 AM)                 PCP:  [facility]ins  Chief Complaint:  congestion, headache, nausea, and fever x 4 days.  History of Present Illness: this is a new patient to me being seen with the acute onset of a cough 4 days ago. She states the cough had a very slow gradual onset. There was nothing abrupt about it. She thought at first it was allergies. She said she had a temperature last night of 102 but that has since resolved without any antipyretic therapy. She has a nonproductive cough and has had no chest pain. She has had some headache, nausea, fatigue but no abdominal pain. She had a splenectomy in 2005 due to ITP and at that time received a Pneumovax.    Past Medical History:    Reviewed history from 08/05/2007 and no changes required:       UCD       Hx of DVT (ICD-453.40) '04       PVD (ICD-443.9)       GERD (ICD-530.81)       MITRAL REGURGITATION, 2-3 PLUS (ICD-396.3)       Hx of IMMUNE THROMBOCYTOPENIC PURPURA (ICD-287.31) '04       Hx of PERICARDITIS (ICD-423.9)       CORONARY ARTERY DISEASE, S/P PTCA (ICD-414.9) last cath Sept '08 2 vessel disease       LUPUS ERYTHEMATOSUS (ICD-695.4)                            Physician Roster:                 Card- Dr. Elsie Lincoln                 Rheum - Dr. Link Snuffer                 Heme - Dr. Arline Asp  Past Surgical History:    Reviewed history from 08/05/2007 and no changes required:       plastic surgical repair dog bite to leg       splenectomy (ITP) '04       G2P2   Family History:    Reviewed history from 08/05/2007 and no changes required:       mother- breast cancer, cervical cancer       PGF-colon cancer       Pos  - CAD, DM, HTN, Lipids  Social History:    Occupation:administrator for nonprofit, part-time student    Current Smoker    Alcohol use-no    Drug use-no    Regular exercise-no   Risk Factors:  Tobacco use:  current    Cigarettes:  Yes -- 1 pack(s) per day    Counseled to quit/cut down tobacco use:  yes Passive smoke exposure:  no Drug use:  no HIV high-risk behavior:  no Alcohol use:  no Exercise:  no  Family History Risk Factors:    Family History of MI in females < 65 years  old:  no    Family History of MI in males < 74 years old:  no   Review of Systems  Resp      Complains of cough.      Denies chest discomfort, chest pain with inspiration, coughing up blood, excessive snoring, pleuritic, shortness of breath, sputum productive, and wheezing.   Physical Exam  General:     alert, well-developed, well-nourished, well-hydrated, normal appearance, healthy-appearing, cooperative to examination, good hygiene, and overweight-appearing.   Ears:     External ear exam shows no significant lesions or deformities.  Otoscopic examination reveals clear canals, tympanic membranes are intact bilaterally without bulging, retraction, inflammation or discharge. Hearing is grossly normal bilaterally. Nose:     External nasal examination shows no deformity or inflammation. Nasal mucosa are pink and moist without lesions or exudates. Mouth:     Oral mucosa and oropharynx without lesions or exudates.  Teeth in good repair. Neck:     No deformities, masses, or tenderness noted. Lungs:     Normal respiratory effort, chest expands symmetrically. Lungs are clear to auscultation, no crackles or wheezes. Heart:     Normal rate and regular rhythm. S1 and S2 normal without gallop, murmur, click, rub or other extra sounds. Abdomen:     Bowel sounds positive,abdomen soft and non-tender without masses, organomegaly or hernias noted. Skin:     turgor normal, color normal, no rashes, and no edema.    Cervical Nodes:     no anterior cervical adenopathy and no posterior cervical adenopathy.      Impression & Recommendations:  Problem # 1:  COUGH (ICD-786.2) Assessment: New  Orders: T-2 View CXR (71020TC) Tobacco use cessation intermediate 3-10 minutes (13086)   Problem # 2:  SPLENECTOMY, HX OF (ICD-V45.79) Assessment: Unchanged  Orders: T-2 View CXR (71020TC) Tobacco use cessation intermediate 3-10 minutes (57846)   Problem # 3:  FEVER UNSPECIFIED (ICD-780.60) Assessment: New influenza screening is negative. She'll need a chest x-ray to rule out pneumonia. She needs empirical therapy with Levaquin to treat typical and atypical bacteria in light of her history of splenectomy  Problem # 4:  TOBACCO ABUSE (ICD-305.1) Assessment: Deteriorated  Orders: T-2 View CXR (71020TC) Tobacco use cessation intermediate 3-10 minutes (96295)   Complete Medication List: 1)  Altace 1.25 Mg Tabs (Ramipril) .... Take 1 tablet by mouth once a day 2)  Atenolol 25 Mg Tabs (Atenolol) .... Take 1 tablet by mouth once a day 3)  Protonix 40 Mg Tbec (Pantoprazole sodium) .... Take 1 tablet by mouth once a day 4)  Pravachol 40 Mg Tabs (Pravastatin sodium) .... Take 1 tab by mouth at bedtime 5)  Nitroglycerin 0.4 Mg Subl (Nitroglycerin) .... As needed 6)  Tums 500 Mg Chew (Calcium carbonate antacid) .... As needed 7)  Levothyroxine Sodium 50 Mcg Tabs (Levothyroxine sodium) .... Take 1 tablet by mouth once a day 8)  Levaquin 500 Mg Tabs (Levofloxacin) .... Once daily 9)  Tussicaps 10-8 Mg Xr12h-cap (Hydrocod polst-chlorphen polst) .... At bedtime as needed cough  Other Orders: Flu A+B (28413)   Patient Instructions: 1)  Please schedule a follow-up appointment in 2 weeks. 2)  Tobacco is very bad for your health and your loved ones! You Should stop smoking!. 3)  Stop Smoking Tips: Choose a Quit date. Cut down before the Quit date. decide what you will do as a substitute when you feel the urge  to smoke(gum,toothpick,exercise). 4)  It is important that you exercise regularly at least 20  minutes 5 times a week. If you develop chest pain, have severe difficulty breathing, or feel very tired , stop exercising immediately and seek medical attention. 5)  You need to lose weight. Consider a lower calorie diet and regular exercise.  6)  If you could be exposed to sexually transmitted diseases, you should use a condom. 7)  Get plenty of rest, drink lots of clear liquids, and use Tylenol or Ibuprofen for fever and comfort. Return in 7-10 days if you're not better:sooner if you're feeling worse. 8)  Take your antibiotic as prescribed until ALL of it is gone, but stop if you develop a rash or swelling and contact our office as soon as possible. 9)  Recommended remaining out of school until 12/13/07   Prescriptions: TUSSICAPS 10-8 MG XR12H-CAP (HYDROCOD POLST-CHLORPHEN POLST) at bedtime as needed cough  #4 x 1   Entered and Authorized by:   Etta Grandchild MD   Signed by:   Etta Grandchild MD on 12/09/2007   Method used:   Print then Give to Patient   RxID:   [id number] LEVAQUIN 500 MG TABS (LEVOFLOXACIN) once daily  #10 x 0   Entered and Authorized by:   Etta Grandchild MD   Signed by:   Etta Grandchild MD on 12/09/2007   Method used:   [facility]ron[facility] to        [facility]  [address] [phone]* (retail)       [address]       [place], [place]  [address]       Ph: [phone] or [phone]       Fax: [phone]   RxID:   [id number]  ] Laboratory Results  Date/Time Received: Rock Nephew CMA  December 09, 2007 10:38 AM   Other Tests  Influenza A: negative Influenza B: negative   Appended Document: fever/cough FLU RM SD As billing provider, I have reviewed this document.

## 2010-04-23 NOTE — Progress Notes (Signed)
Summary: [facility]  [facility]   Imported By: Harlon Flor 01/26/2009 16:31:59  _____________________________________________________________________  External Attachment:    Type:   Image     Comment:   External Document

## 2010-04-23 NOTE — Progress Notes (Signed)
  Phone Note Refill Request Message from:  Fax from Pharmacy on April 23, 2009 1:42 PM  Refills Requested: Medication #1:  LEVOTHYROXINE SODIUM 50 MCG TABS Take 1 tablet by mouth once a day Initial call taken by: Ami Bullins CMA,  April 23, 2009 1:42 PM    Prescriptions: LEVOTHYROXINE SODIUM 50 MCG TABS (LEVOTHYROXINE SODIUM) Take 1 tablet by mouth once a day  #30 Tablet x 2   Entered by:   Ami Bullins CMA   Authorized by:   Jacques Navy MD   Signed by:   Bill Salinas CMA on 04/23/2009   Method used:   [facility]  [address] [id number]ail[facility]       [address]       [place], [place]  [address]       Ph: [phone]       Fax: [phone]   RxID:   [id number]

## 2010-04-23 NOTE — Assessment & Plan Note (Signed)
Summary: ROV. GD      Allergies Added: NKDA  Visit Type:  rov Primary Provider:  Jacques Navy MD  CC:  None.  History of Present Illness: 55 yo AAF with h/o CAD s/p bare metal stent RCA and Circumflex 2003, PTCA Diagonal 2003, hypothyroidism, moderate MR, GERD here today for follow up. She has previously been followed in [facility] clinic by Dr. Elsie Lincoln. I saw her as a new patient several weeks ago. She reported heaviness in center of chest that occurs with rest or with exertion/movement. Occurs more often at night. There is associated SOB but no dizziness. These episodes last for several minutes and resolve with NTG. She has been having this type of chest pain since her stents were placed in 2003. There has been no increase in frequency or duration. She denies near syncope or syncope. She also reported bilateral lower extremity pain with rest. It is mainly in the calf muscles. The pain is not worsened with exertion. No ulcerations on her feet. No loss of sensation in feet. She does have lower extremity edema that resolves when she elevates her legs.   At the last visit I ordered an echo and lower extremity arterial dopplers. She is here today to review her test results.   Current Medications (verified): 1)  Altace 1.25 Mg  Tabs (Ramipril) .... Take 1 Tablet By Mouth Once A Day 2)  Atenolol 25 Mg  Tabs (Atenolol) .... Take 1 Tablet By Mouth Once A Day 3)  Protonix 40 Mg  Tbec (Pantoprazole Sodium) .... Take 1 Tablet By Mouth Once A Day 4)  Simvastatin 40 Mg Tabs (Simvastatin) .... Take One Tablet By Mouth Daily At Bedtime 5)  Nitroglycerin 0.4 Mg  Subl (Nitroglycerin) .... As Needed 6)  Levothyroxine Sodium 50 Mcg Tabs (Levothyroxine Sodium) .... Take 1 Tablet By Mouth Once A Day 7)  Aspirin 325 Mg Tabs (Aspirin) .... One By Mouth Daily  Allergies (verified): No Known Drug Allergies  Past History:  Past Medical History: Reviewed history from 01/26/2009 and no changes  required. Mixed connective tissue disorder/SLE Hx of DVT (ICD-453.40) '04 PVD (ICD-443.9) GERD (ICD-530.81) MITRAL REGURGITATION, 2-3 PLUS (ICD-396.3) Hx of IMMUNE THROMBOCYTOPENIC PURPURA  '04 Hx of PERICARDITIS (ICD-423.9) CORONARY ARTERY DISEASE, S/P PTCA (ICD-414.9) with stents in mid Circumflex and mid RCA in 2003, and balloon angioplasty of Diagonal branch October 2003,  last cath Sept '08 2 vessel disease with patent stents LUPUS ERYTHEMATOSUS (ICD-695.4) Hypothyroidism    Physician Roster:           Card- Dr. Elsie Lincoln           Rheum - Dr. Link Snuffer           Heme - Dr. Arline Asp  Social History: In college [name] ('09)-Undergraduate Religious Studies , graduated from [employer] in [place] state. Marrried '73 - 2 years/ divorced. 2 son- '73- CP, '75;  Occupation:administrator for nonprofit, part-time student Pt is a smoker-1/2 pack per day x 30 days No illlicit drug use No etoh  Review of Systems       The patient complains of chest pain.  The patient denies fatigue, malaise, fever, weight gain/loss, vision loss, decreased hearing, hoarseness, palpitations, shortness of breath, prolonged cough, wheezing, sleep apnea, coughing up blood, abdominal pain, blood in stool, nausea, vomiting, diarrhea, heartburn, incontinence, blood in urine, muscle weakness, joint pain, leg swelling, rash, skin lesions, headache, fainting, dizziness, depression, anxiety, enlarged lymph nodes, easy bruising or bleeding, and environmental allergies.  Vital Signs:  Patient profile:   56 year old female Height:      67 inches Weight:      217 pounds BMI:     34.11 Pulse rate:   66 / minute Pulse rhythm:   regular BP sitting:   108 / 72  (left arm) Cuff size:   large  Vitals Entered By: Danielle Rankin, CMA (February 26, 2009 8:59 AM)  Physical Exam  General:  General: Well developed, well nourished, NAD Psychiatric: Mood and affect normal Neck: No JVD, no carotid bruits, no  thyromegaly, no lymphadenopathy. Lungs:Clear bilaterally, no wheezes, rhonci, crackles CV: RRR no murmurs, gallops rubs Abdomen: soft, NT, ND, BS present Extremities: No edema, pulses 2+.    EKG  Procedure date:  02/26/2009  Findings:      NSR, rate 66 bpm.   Echocardiogram  Procedure date:  02/16/2009  Findings:      Left ventricle: The cavity size was normal. Wall thickness was       normal. Systolic function was normal. The estimated ejection       fraction was in the range of 60% to 65%. Doppler parameters are       consistent with abnormal left ventricular relaxation (grade 1       diastolic dysfunction).     - Mitral valve: Mitral valve is mildly thickened, calcified. Mild to       moderate regurgitation.     - Tricuspid valve: Mild-moderate regurgitation.  Arterial Doppler  Procedure date:  02/16/2009  Findings:      Normal Waveforms and ABI bilaterally.   Impression & Recommendations:  Problem # 1:  CORONARY ARTERY DISEASE, S/P PTCA (ICD-414.9) Stable. No changes in therapy. We have sent a letter to Dr. Carolynne Edouard and have recommended that she proceed with cholecystectomy later this week.   Her updated medication list for this problem includes:    Altace 1.25 Mg Tabs (Ramipril) .Marland Kitchen... Take 1 tablet by mouth once a day    Atenolol 25 Mg Tabs (Atenolol) .Marland Kitchen... Take 1 tablet by mouth once a day    Nitroglycerin 0.4 Mg Subl (Nitroglycerin) .Marland Kitchen... As needed    Aspirin 325 Mg Tabs (Aspirin) ..... One by mouth daily  Orders: EKG w/ Interpretation (93000)  Problem # 2:  MITRAL VALVE DISORDERS (ICD-424.0) MIld to moderate MR with normal LV size and function. Repeat echo on year.   Her updated medication list for this problem includes:    Altace 1.25 Mg Tabs (Ramipril) .Marland Kitchen... Take 1 tablet by mouth once a day    Atenolol 25 Mg Tabs (Atenolol) .Marland Kitchen... Take 1 tablet by mouth once a day    Nitroglycerin 0.4 Mg Subl (Nitroglycerin) .Marland Kitchen... As needed  Problem # 3:   HYPERLIPIDEMIA-MIXED (ICD-272.4) Continue statin. Check LFTs and fasting lipids today.   Her updated medication list for this problem includes:    Simvastatin 40 Mg Tabs (Simvastatin) .Marland Kitchen... Take one tablet by mouth daily at bedtime  Patient Instructions: 1)  Your physician recommends that you schedule a follow-up appointment in: 6 months 2)  Your physician recommends that you continue on your current medications as directed. Please refer to the Current Medication list given to you today.

## 2010-04-23 NOTE — Assessment & Plan Note (Signed)
Summary: per check out/sf      Allergies Added:   Primary Provider:  Jacques Navy MD  CC:  Patient more lethargic. No CP/SOB.She is also having leg pain every night. It is not relieved by rest..  History of Present Illness: 55 yo AAF with h/o CAD s/p bare metal stent RCA and Circumflex 2003, PTCA Diagonal 2003, hypothyroidism, moderate MR, GERD here today for cardiac follow up. She had previously been followed in [facility] clinic by Dr. Elsie Lincoln.  I saw her as a new patient in November 2010. She has had some chest pain at times since her stents were placed in 2003.Chest pain has been much less often and less intense.   She denies near syncope or syncope. She has changed her diet to fruits and vegetables. She has been exercising three times per week. No SOB with exertion. She has almost totally stopped smoking. Some mild edema ankles. She recently stopped Zocor because of muscle aches.   Her echo in November 2010 showed normal LV size and function.  There was mild to moderate TR and MR. She has had pain in her back and in both legs at night. No exertional leg pain. Lower ext dopplers with no evidence of PAD in 2011. She is here today for follow up. She has had no chest pain or SOB. She does continue to smoke but wants to stop.   Problems Prior to Update: 1)  Uti  (ICD-599.0) 2)  Sinusitis- Acute-nos  (ICD-461.9) 3)  Pure Hypercholesterolemia  (ICD-272.0) 4)  Coronary Atherosclerosis Native Coronary Artery  (ICD-414.01) 5)  Mitral Valve Disorders  (ICD-424.0) 6)  Hyperlipidemia-mixed  (ICD-272.4) 7)  Fibroids, Uterus  (ICD-218.9) 8)  Tobacco Abuse  (ICD-305.1) 9)  Splenectomy, Hx of  (ICD-V45.79) 10)  Cough  (ICD-786.2) 11)  Hx of Dvt  (ICD-453.40) 12)  Pvd  (ICD-443.9) 13)  Gerd  (ICD-530.81) 14)  Mitral Regurgitation, 2-3 Plus  (ICD-396.3) 15)  Hx of Immune Thrombocytopenic Purpura  (ICD-287.31) 16)  Hx of Pericarditis  (ICD-423.9) 17)  Coronary Artery Disease, S/p  Ptca  (ICD-414.9) 18)  Lupus Erythematosus  (ICD-695.4)  Current Medications (verified): 1)  Atenolol 25 Mg  Tabs (Atenolol) .... Take 1 Tablet By Mouth Once A Day 2)  Protonix 40 Mg  Tbec (Pantoprazole Sodium) .... Take 1 Tablet By Mouth Once A Day 3)  Nitroglycerin 0.4 Mg  Subl (Nitroglycerin) .... As Needed 4)  Levothyroxine Sodium 50 Mcg Tabs (Levothyroxine Sodium) .... Take 1 Tablet By Mouth Once A Day 5)  Aspirin 325 Mg Tabs (Aspirin) .... One By Mouth Daily 6)  Crestor 10 Mg Tabs (Rosuvastatin Calcium) .... Take One Tablet By Mouth Daily.  Allergies (verified): 1)  ! Ace Inhibitors  Past History:  Past Medical History: Reviewed history from 01/26/2009 and no changes required. Mixed connective tissue disorder/SLE Hx of DVT (ICD-453.40) '04 PVD (ICD-443.9) GERD (ICD-530.81) MITRAL REGURGITATION, 2-3 PLUS (ICD-396.3) Hx of IMMUNE THROMBOCYTOPENIC PURPURA  '04 Hx of PERICARDITIS (ICD-423.9) CORONARY ARTERY DISEASE, S/P PTCA (ICD-414.9) with stents in mid Circumflex and mid RCA in 2003, and balloon angioplasty of Diagonal branch October 2003,  last cath Sept '08 2 vessel disease with patent stents LUPUS ERYTHEMATOSUS (ICD-695.4) Hypothyroidism    Physician Roster:           Card- Dr. Elsie Lincoln           Rheum - Dr. Link Snuffer           Heme - Dr. Arline Asp  Social History:  Reviewed history from 09/20/2009 and no changes required. In college [name] ('09)-Undergraduate Religious Studies , graduated from [employer] in [place] state. Marrried '73 - 2 years/ divorced. 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         Back pain and leg pain when her back is aching.   Vital Signs:  Patient profile:   55 year old female Height:      67 inches Weight:      222 pounds Resp:     14 per minute BP sitting:   122 / 80  (left arm)  Vitals Entered By: Danielle Rankin, CMA (February 26, 2010 12:41 PM) CC: Patient more lethargic. No CP/SOB.She is also having leg pain every night. It is not relieved by rest. Pain Assessment Patient in pain? no        Physical Exam  General:  General: Well developed, well nourished, NAD HEENT: OP clear, mucus membranes moist Musculoskeletal: Muscle strength 5/5 all ext Psychiatric: Mood and affect normal Neck: No JVD, no carotid bruits, no thyromegaly, no lymphadenopathy. Lungs:Clear bilaterally, no wheezes, rhonci, crackles CV: RRR no murmurs, gallops rubs Abdomen: soft, NT, ND, BS present Extremities: No edema, pulses 2+.    EKG  Procedure date:  02/26/2010  Findings:      NSR, rate 66 bpm. Normal EKG.   Impression & Recommendations:  Problem # 1:  CORONARY ATHEROSCLEROSIS NATIVE CORONARY ARTERY (ICD-414.01) Stable. Continue current therapy. Smoking cessation strongly encouraged.   Her updated medication list for this problem includes:    Atenolol 25 Mg Tabs (Atenolol) .Marland Kitchen... Take 1 tablet by mouth once a day    Nitroglycerin 0.4 Mg Subl (Nitroglycerin) .Marland Kitchen... As needed    Aspirin 325 Mg Tabs (Aspirin) ..... One by mouth daily  Orders: EKG w/ Interpretation (93000) Echocardiogram (Echo)  Problem # 2:  MITRAL VALVE DISORDERS (ICD-424.0) Will repeat echo to reassess her MIV. Last year with moderate MV  regurgitation.   Her updated medication list for this problem includes:    Atenolol  25 Mg Tabs (Atenolol) .Marland Kitchen... Take 1 tablet by mouth once a day    Nitroglycerin 0.4 Mg Subl (Nitroglycerin) .Marland Kitchen... As needed  Orders: Echocardiogram (Echo)  Problem # 3:  HYPERLIPIDEMIA-MIXED (ICD-272.4) Continue statin. Check fasting lipids and LFTs today.   Her updated medication list for this problem includes:    Crestor 10 Mg Tabs (Rosuvastatin calcium) .Marland Kitchen... Take one tablet by mouth daily.  Orders: TLB-Lipid Panel (80061-LIPID) TLB-Hepatic/Liver Function Pnl (80076-HEPATIC)  Patient Instructions: 1)  Your physician recommends that you schedule a follow-up appointment in: 6 months. 2)  Your physician recommends that you continue on your current medications as directed. Please refer to the Current Medication list given to you today. 3)  Your physician has requested that you have an echocardiogram.  Echocardiography is a painless test that uses sound waves to create images of your heart. It provides your doctor with information about the size and shape of your heart  and how well your heart's chambers and valves are working.  This procedure takes approximately one hour. There are no restrictions for this procedure.

## 2010-04-23 NOTE — Cardiovascular Report (Signed)
Summary: Cardiac Cath Other  Cardiac Cath Other   Imported By: Harlon Flor 01/26/2009 16:30:23  _____________________________________________________________________  External Attachment:    Type:   Image     Comment:   External Document

## 2010-04-23 NOTE — Letter (Signed)
Summary: Clearance Letter  Architectural technologist at [facility] [address]   [place], [place] [address]   Phone: [phone]  Fax: [phone]    February 21, 2009  Re:     [facility] Address:   [address]     [place], [place]  [address] DOB:     1955-06-19 MRN:     [id number]   Dear Dr. Carolynne Edouard  [name] is cleared from a cardiology stand point for her gallbladder surgery. Please feel free to call with any questions.      Sincerely,    Mercer Pod, CMA Verne Carrow, MD  Appended Document: Clearance Letter faxed letter to [facility] dr toth [phone] Cala Bradford :)

## 2010-04-23 NOTE — Progress Notes (Signed)
Summary: Levothyroid refill  Phone Note Refill Request Message from:  Fax from Pharmacy on Aug 01, 2009 11:46 AM  Refills Requested: Medication #1:  LEVOTHYROXINE SODIUM 50 MCG TABS Take 1 tablet by mouth once a day Next Appointment Scheduled: none Initial call taken by: Lucious Groves,  Aug 01, 2009 11:46 AM    Prescriptions: LEVOTHYROXINE SODIUM 50 MCG TABS (LEVOTHYROXINE SODIUM) Take 1 tablet by mouth once a day  #30 Tablet x 2   Entered by:   Lucious Groves   Authorized by:   Jacques Navy MD   Signed by:   Lucious Groves on 08/01/2009   Method used:   [facility]  [address] [phone][facility] ([facility]ail[facility]       [address]       [place], [place]  [address]       Ph: [phone]       Fax: [phone]   RxID:   [id number]

## 2010-04-23 NOTE — Progress Notes (Signed)
   Recieved Request form [employer] forwarded to [facility] for processing [facility]  March 28, 2009 8:12 AM

## 2010-06-12 LAB — CBC
HCT: 37.8 % (ref 36.0–46.0)
Hemoglobin: 12.8 g/dL (ref 12.0–15.0)
MCHC: 33.9 g/dL (ref 30.0–36.0)
MCV: 98.3 fL (ref 78.0–100.0)
Platelets: 181 10*3/uL (ref 150–400)
RBC: 3.85 MIL/uL — ABNORMAL LOW (ref 3.87–5.11)
RDW: 14.2 % (ref 11.5–15.5)
WBC: 7.7 10*3/uL (ref 4.0–10.5)

## 2010-06-12 LAB — DIFFERENTIAL
Basophils Absolute: 0.3 10*3/uL — ABNORMAL HIGH (ref 0.0–0.1)
Basophils Relative: 4 % — ABNORMAL HIGH (ref 0–1)
Eosinophils Absolute: 0.1 10*3/uL (ref 0.0–0.7)
Eosinophils Relative: 1 % (ref 0–5)
Lymphocytes Relative: 18 % (ref 12–46)
Lymphs Abs: 1.3 10*3/uL (ref 0.7–4.0)
Monocytes Absolute: 0.8 10*3/uL (ref 0.1–1.0)
Monocytes Relative: 10 % (ref 3–12)
Neutro Abs: 5.2 10*3/uL (ref 1.7–7.7)
Neutrophils Relative %: 67 % (ref 43–77)

## 2010-06-12 LAB — URINE MICROSCOPIC-ADD ON

## 2010-06-12 LAB — POCT I-STAT, CHEM 8
BUN: 14 mg/dL (ref 6–23)
Calcium, Ion: 1.11 mmol/L — ABNORMAL LOW (ref 1.12–1.32)
Chloride: 108 mEq/L (ref 96–112)
Creatinine, Ser: 1.1 mg/dL (ref 0.4–1.2)
Glucose, Bld: 83 mg/dL (ref 70–99)
HCT: 38 % (ref 36.0–46.0)
Hemoglobin: 12.9 g/dL (ref 12.0–15.0)
Potassium: 4.3 mEq/L (ref 3.5–5.1)
Sodium: 140 mEq/L (ref 135–145)
TCO2: 30 mmol/L (ref 0–100)

## 2010-06-12 LAB — URINALYSIS, ROUTINE W REFLEX MICROSCOPIC
Bilirubin Urine: NEGATIVE
Glucose, UA: NEGATIVE mg/dL
Ketones, ur: NEGATIVE mg/dL
Nitrite: NEGATIVE
Protein, ur: NEGATIVE mg/dL
Specific Gravity, Urine: 1.013 (ref 1.005–1.030)
Urobilinogen, UA: 0.2 mg/dL (ref 0.0–1.0)
pH: 6 (ref 5.0–8.0)

## 2010-06-12 LAB — URINE CULTURE: Colony Count: >=100000

## 2010-06-25 LAB — COMPREHENSIVE METABOLIC PANEL
ALT: 14 U/L (ref 0–35)
AST: 22 U/L (ref 0–37)
Albumin: 3.6 g/dL (ref 3.5–5.2)
Alkaline Phosphatase: 98 U/L (ref 39–117)
BUN: 12 mg/dL (ref 6–23)
CO2: 27 mEq/L (ref 19–32)
Calcium: 9.7 mg/dL (ref 8.4–10.5)
Chloride: 108 mEq/L (ref 96–112)
Creatinine, Ser: 0.97 mg/dL (ref 0.4–1.2)
GFR calc Af Amer: >60 mL/min (ref >60)
GFR calc non Af Amer: >60 mL/min (ref >60)
Glucose, Bld: 89 mg/dL (ref 70–99)
Potassium: 4.7 mEq/L (ref 3.5–5.1)
Sodium: 141 mEq/L (ref 135–145)
Total Bilirubin: 0.6 mg/dL (ref 0.3–1.2)
Total Protein: 6.7 g/dL (ref 6.0–8.3)

## 2010-06-25 LAB — DIFFERENTIAL
Basophils Absolute: 0 10*3/uL (ref 0.0–0.1)
Basophils Relative: 1 % (ref 0–1)
Eosinophils Absolute: 0.1 10*3/uL (ref 0.0–0.7)
Eosinophils Relative: 3 % (ref 0–5)
Lymphocytes Relative: 44 % (ref 12–46)
Lymphs Abs: 1.9 10*3/uL (ref 0.7–4.0)
Monocytes Absolute: 0.6 10*3/uL (ref 0.1–1.0)
Monocytes Relative: 15 % — ABNORMAL HIGH (ref 3–12)
Neutro Abs: 1.6 10*3/uL — ABNORMAL LOW (ref 1.7–7.7)
Neutrophils Relative %: 37 % — ABNORMAL LOW (ref 43–77)

## 2010-06-25 LAB — CBC
HCT: 40.2 % (ref 36.0–46.0)
Hemoglobin: 13.7 g/dL (ref 12.0–15.0)
MCHC: 34 g/dL (ref 30.0–36.0)
MCV: 97.7 fL (ref 78.0–100.0)
RBC: 4.12 MIL/uL (ref 3.87–5.11)
RDW: 14.5 % (ref 11.5–15.5)
WBC: 4.4 10*3/uL (ref 4.0–10.5)

## 2010-07-31 ENCOUNTER — Other Ambulatory Visit: Payer: Self-pay | Admitting: Cardiovascular Disease

## 2010-08-03 ENCOUNTER — Emergency Department (HOSPITAL_COMMUNITY): Payer: Medicare Other

## 2010-08-03 ENCOUNTER — Observation Stay (HOSPITAL_COMMUNITY)
Admission: EM | Admit: 2010-08-03 | Discharge: 2010-08-04 | Disposition: A | Discharge status: Home / Self Care | Payer: Medicare Other | Attending: Internal Medicine | Admitting: Internal Medicine

## 2010-08-03 DIAGNOSIS — I252 Old myocardial infarction: Secondary | ICD-10-CM | POA: Insufficient documentation

## 2010-08-03 DIAGNOSIS — R079 Chest pain, unspecified: Secondary | ICD-10-CM

## 2010-08-03 DIAGNOSIS — M542 Cervicalgia: Secondary | ICD-10-CM | POA: Insufficient documentation

## 2010-08-03 DIAGNOSIS — I079 Rheumatic tricuspid valve disease, unspecified: Secondary | ICD-10-CM | POA: Insufficient documentation

## 2010-08-03 DIAGNOSIS — E785 Hyperlipidemia, unspecified: Secondary | ICD-10-CM | POA: Insufficient documentation

## 2010-08-03 DIAGNOSIS — I2510 Atherosclerotic heart disease of native coronary artery without angina pectoris: Secondary | ICD-10-CM | POA: Insufficient documentation

## 2010-08-03 DIAGNOSIS — I059 Rheumatic mitral valve disease, unspecified: Secondary | ICD-10-CM | POA: Insufficient documentation

## 2010-08-03 DIAGNOSIS — E039 Hypothyroidism, unspecified: Secondary | ICD-10-CM | POA: Insufficient documentation

## 2010-08-03 DIAGNOSIS — K219 Gastro-esophageal reflux disease without esophagitis: Secondary | ICD-10-CM | POA: Insufficient documentation

## 2010-08-03 LAB — BASIC METABOLIC PANEL
BUN: 17 mg/dL (ref 6–23)
CO2: 28 mEq/L (ref 19–32)
Calcium: 9.3 mg/dL (ref 8.4–10.5)
Chloride: 103 mEq/L (ref 96–112)
Creatinine, Ser: 0.93 mg/dL (ref 0.4–1.2)
GFR calc Af Amer: >60 mL/min (ref >60)
GFR calc non Af Amer: >60 mL/min (ref >60)
Glucose, Bld: 80 mg/dL (ref 70–99)
Potassium: 4 mEq/L (ref 3.5–5.1)
Sodium: 138 mEq/L (ref 135–145)

## 2010-08-03 LAB — CK TOTAL AND CKMB (NOT AT ARMC)
CK, MB: 1.9 ng/mL (ref 0.3–4.0)
Relative Index: 1.5 (ref 0.0–2.5)
Total CK: 131 U/L (ref 7–177)

## 2010-08-03 LAB — HEPATIC FUNCTION PANEL
ALT: 15 U/L (ref 0–35)
AST: 24 U/L (ref 0–37)
Albumin: 3.3 g/dL — ABNORMAL LOW (ref 3.5–5.2)
Alkaline Phosphatase: 82 U/L (ref 39–117)
Bilirubin, Direct: 0.1 mg/dL (ref 0.0–0.3)
Indirect Bilirubin: 0.1 mg/dL — ABNORMAL LOW (ref 0.3–0.9)
Total Bilirubin: 0.2 mg/dL — ABNORMAL LOW (ref 0.3–1.2)
Total Protein: 6.9 g/dL (ref 6.0–8.3)

## 2010-08-03 LAB — TROPONIN I: Troponin I: <0.3 ng/mL (ref <0.30)

## 2010-08-03 LAB — CBC
HCT: 38.4 % (ref 36.0–46.0)
Hemoglobin: 13.4 g/dL (ref 12.0–15.0)
MCH: 33 pg (ref 26.0–34.0)
MCHC: 34.9 g/dL (ref 30.0–36.0)
MCV: 94.6 fL (ref 78.0–100.0)
Platelets: 195 10*3/uL (ref 150–400)
RBC: 4.06 MIL/uL (ref 3.87–5.11)
RDW: 14.8 % (ref 11.5–15.5)
WBC: 3.7 10*3/uL — ABNORMAL LOW (ref 4.0–10.5)

## 2010-08-03 LAB — POCT CARDIAC MARKERS
CKMB, poc: <1 ng/mL — ABNORMAL LOW (ref 1.0–8.0)
Myoglobin, poc: 67.4 ng/mL (ref 12–200)
Troponin i, poc: <0.05 ng/mL (ref 0.00–0.09)

## 2010-08-03 LAB — DIFFERENTIAL
Basophils Absolute: 0 10*3/uL (ref 0.0–0.1)
Basophils Relative: 1 % (ref 0–1)
Eosinophils Absolute: 0.3 10*3/uL (ref 0.0–0.7)
Eosinophils Relative: 9 % — ABNORMAL HIGH (ref 0–5)
Lymphocytes Relative: 42 % (ref 12–46)
Lymphs Abs: 1.6 10*3/uL (ref 0.7–4.0)
Monocytes Absolute: 0.6 10*3/uL (ref 0.1–1.0)
Monocytes Relative: 16 % — ABNORMAL HIGH (ref 3–12)
Neutro Abs: 1.2 10*3/uL — ABNORMAL LOW (ref 1.7–7.7)
Neutrophils Relative %: 33 % — ABNORMAL LOW (ref 43–77)

## 2010-08-03 LAB — CARDIAC PANEL(CRET KIN+CKTOT+MB+TROPI)
CK, MB: 2.7 ng/mL (ref 0.3–4.0)
Relative Index: 2 (ref 0.0–2.5)
Total CK: 138 U/L (ref 7–177)
Troponin I: <0.3 ng/mL (ref <0.30)

## 2010-08-04 LAB — URINALYSIS, MICROSCOPIC ONLY
Bilirubin Urine: NEGATIVE
Glucose, UA: NEGATIVE mg/dL
Hgb urine dipstick: NEGATIVE
Ketones, ur: NEGATIVE mg/dL
Leukocytes, UA: NEGATIVE
Nitrite: NEGATIVE
Protein, ur: NEGATIVE mg/dL
Specific Gravity, Urine: 1.012 (ref 1.005–1.030)
Urobilinogen, UA: 0.2 mg/dL (ref 0.0–1.0)
pH: 6.5 (ref 5.0–8.0)

## 2010-08-04 LAB — CBC
HCT: 37.5 % (ref 36.0–46.0)
Hemoglobin: 13 g/dL (ref 12.0–15.0)
MCH: 32.7 pg (ref 26.0–34.0)
MCHC: 34.7 g/dL (ref 30.0–36.0)
MCV: 94.2 fL (ref 78.0–100.0)
Platelets: 190 10*3/uL (ref 150–400)
RBC: 3.98 MIL/uL (ref 3.87–5.11)
RDW: 14.9 % (ref 11.5–15.5)
WBC: 5 10*3/uL (ref 4.0–10.5)

## 2010-08-04 LAB — BASIC METABOLIC PANEL
BUN: 16 mg/dL (ref 6–23)
CO2: 25 mEq/L (ref 19–32)
Calcium: 9.1 mg/dL (ref 8.4–10.5)
Chloride: 104 mEq/L (ref 96–112)
Creatinine, Ser: 0.93 mg/dL (ref 0.4–1.2)
GFR calc Af Amer: >60 mL/min (ref >60)
GFR calc non Af Amer: >60 mL/min (ref >60)
Glucose, Bld: 107 mg/dL — ABNORMAL HIGH (ref 70–99)
Potassium: 4 mEq/L (ref 3.5–5.1)
Sodium: 138 mEq/L (ref 135–145)

## 2010-08-04 LAB — LIPID PANEL
Cholesterol: 159 mg/dL (ref 0–200)
HDL: 48 mg/dL (ref >39)
LDL Cholesterol: 95 mg/dL (ref 0–99)
Total CHOL/HDL Ratio: 3.3 RATIO
Triglycerides: 81 mg/dL (ref <150)
VLDL: 16 mg/dL (ref 0–40)

## 2010-08-04 LAB — CARDIAC PANEL(CRET KIN+CKTOT+MB+TROPI)
CK, MB: 2.2 ng/mL (ref 0.3–4.0)
Relative Index: 1.7 (ref 0.0–2.5)
Total CK: 131 U/L (ref 7–177)
Troponin I: <0.3 ng/mL (ref <0.30)

## 2010-08-05 ENCOUNTER — Telehealth: Payer: Self-pay | Admitting: Internal Medicine

## 2010-08-05 NOTE — Telephone Encounter (Signed)
Pt sched for post hosp f/u for 5/17. Pt was at [facility] over the weekend for chest pain,heart related issues and was discharged 5/13. Pt request ov tues 5/15 for all this.

## 2010-08-05 NOTE — Telephone Encounter (Signed)
Tues if there are slots or thursday

## 2010-08-06 ENCOUNTER — Encounter: Payer: Self-pay | Admitting: Internal Medicine

## 2010-08-06 NOTE — Cardiovascular Report (Signed)
[name], [name]               ACCOUNT NO.:  [id number]   MEDICAL RECORD NO.:  [id number]          PATIENT TYPE:  INP   LOCATION:  2807                         FACILITY:  [facility]   PHYSICIAN:  Madaline Savage, M.D.DATE OF BIRTH:  [birth year]   DATE OF PROCEDURE:  11/30/2006  DATE OF DISCHARGE:                            CARDIAC CATHETERIZATION   PROCEDURES PERFORMED:  1. Selective coronary angiography by Judkins technique.  2. Retrograde left heart catheterization.  3. Left ventricular angiography.   COMPLICATIONS:  None.   ENTRY SITE:  Right femoral.   DYE USED:  Omnipaque.   PATIENT PROFILE:  The patient is a 55 year old obese African-American  woman who presents to the [facility] Emergency Room early in the morning  of November 30, 2006.  She came in because of chest discomfort in the  sternal area associated with dyspnea at rest that had its onset about 24  hours prior to entry.  It was not associated with exertion.  The patient  was watching television at the time it occurred.  It waxed and waned and  it involved the shoulders; and it radiated into her left arm.  She did  have nausea and vomiting, yesterday.  She had also experienced other  symptoms such as dyspnea on exertion, tiredness, and a feeling of  indigestion for the last week.  This morning she woke up, and had  shoulder discomfort, and decided to come to the emergency room for  evaluation; based on having had two stents placed in the past to  circumflex an RCA.  These were placed in 2003.  Since that time she has  had catheterizations, several times, and has had patency of stents.  She  also had some pericarditis in the past; and this pain was not associated  with a pleural pain, fever or recent viral illnesses.   The patient also has had splenectomy lupus erythematosus, hypothyroidism  and hyperlipidemia.  She has a family history of coronary disease.  The  patient's EKG showed T-wave abnormalities that  were new since her last  EKG from approximately a year ago.  Cardiac enzymes have been negative,  but the patient's pain sounded worthy of having an invasive evaluation.  We were able to bring the patient here from the emergency room and  cardiac catheterization was performed electively without any  complications.  Results are described.  Left ventricular pressure was  110/11, end-diastolic pressure 20.  Central aortic pressure 110/78, mean  of 94, no aortic valve gradient by pullback technique.   ANGIOGRAPHIC RESULTS:  1. The patient's left main coronary artery was widely patent.  2. The left anterior descending coronary artery coursed through the      cardiac apex giving rise to two diagonal branches; and at least two      small septal perforator branches.  No significant lesions were      noted in the LAD.  3. Circumflex coronary artery gave rise to two obtuse marginal      branches.  The first of which was long and fairly large in caliber.  The second was medium-to-large and trifurcated.  No lesions were      seen in either obtuse marginal branch.  The circumflex, itself, in      midportion of the vessel between OM #1 and OM #2 contained a radio-      opaque stent which was smooth and pristine in its appearance in two      views.  There were luminal irregularities in the distal circumflex      which did not appear to be significant.  4. The right coronary artery was dominant in terms of its having a      posterior descending branch.  The mid posterior descending branch      contained a 50% area of luminal irregularity which was comparable      to what she had the last time she had a catheterization which was      in 2005.  There was a radio-opaque stent in the midportion of the      of the RCA just beyond acute diagonal branch.  The acute diagonal      branch shows mild jailing of a very small acute marginal branch      which could be a culprit vessel in terms of her angina.  It  is not      a vessel that could be intervened upon.   Left ventricular angiography shows good contractility of the anterior  wall and apex; and there is a large inferior basal myocardial infarction  that is old and unchanged from previous ventriculograms I would estimate  ejection fraction of be approximately 50%; and I would also describe 1+  mitral regurgitation.   FINAL IMPRESSIONS:  1. A 2-vessel coronary artery disease of the circumflex and right      coronary artery.  2. Old inferior myocardial infarction seen on ventriculogram occupying      two-thirds of the inferior wall of the left ventricle with moderate      hypokinesis.  3. No evidence of left ventricular thrombus.   PLAN:  The patient will be treated medically and released and reassured  about her situation which does not appear to be appreciably different  from her last cath from 3 years ago.  In a dictation on an [place]  status copies to Casimiro Needle nor ends MD copy to Madaline Savage, MD  copies to [facility], cath lab.  Gamble dictating thank you and           ______________________________  Madaline Savage, M.D.     WHG/MEDQ  D:  11/30/2006  T:  11/30/2006  Job:  [id number]   cc:   Rosalyn Gess. Norins, MD  Madaline Savage, M.D.  [facility] Cath Lab

## 2010-08-07 ENCOUNTER — Emergency Department (HOSPITAL_COMMUNITY)
Admission: EM | Admit: 2010-08-07 | Discharge: 2010-08-07 | Disposition: A | Discharge status: Home / Self Care | Payer: Medicare Other | Attending: Emergency Medicine | Admitting: Emergency Medicine

## 2010-08-07 ENCOUNTER — Emergency Department (HOSPITAL_COMMUNITY): Payer: Medicare Other

## 2010-08-07 DIAGNOSIS — R109 Unspecified abdominal pain: Secondary | ICD-10-CM | POA: Insufficient documentation

## 2010-08-07 DIAGNOSIS — R112 Nausea with vomiting, unspecified: Secondary | ICD-10-CM | POA: Insufficient documentation

## 2010-08-07 DIAGNOSIS — I10 Essential (primary) hypertension: Secondary | ICD-10-CM | POA: Insufficient documentation

## 2010-08-07 DIAGNOSIS — Z79899 Other long term (current) drug therapy: Secondary | ICD-10-CM | POA: Insufficient documentation

## 2010-08-07 DIAGNOSIS — K219 Gastro-esophageal reflux disease without esophagitis: Secondary | ICD-10-CM | POA: Insufficient documentation

## 2010-08-07 DIAGNOSIS — Z7982 Long term (current) use of aspirin: Secondary | ICD-10-CM | POA: Insufficient documentation

## 2010-08-07 DIAGNOSIS — I252 Old myocardial infarction: Secondary | ICD-10-CM | POA: Insufficient documentation

## 2010-08-07 DIAGNOSIS — K5900 Constipation, unspecified: Secondary | ICD-10-CM | POA: Insufficient documentation

## 2010-08-07 DIAGNOSIS — R1915 Other abnormal bowel sounds: Secondary | ICD-10-CM | POA: Insufficient documentation

## 2010-08-07 DIAGNOSIS — E7800 Pure hypercholesterolemia, unspecified: Secondary | ICD-10-CM | POA: Insufficient documentation

## 2010-08-07 DIAGNOSIS — R079 Chest pain, unspecified: Secondary | ICD-10-CM | POA: Insufficient documentation

## 2010-08-07 DIAGNOSIS — I2510 Atherosclerotic heart disease of native coronary artery without angina pectoris: Secondary | ICD-10-CM | POA: Insufficient documentation

## 2010-08-07 LAB — URINALYSIS, ROUTINE W REFLEX MICROSCOPIC
Bilirubin Urine: NEGATIVE
Glucose, UA: NEGATIVE mg/dL
Hgb urine dipstick: NEGATIVE
Ketones, ur: NEGATIVE mg/dL
Nitrite: NEGATIVE
Protein, ur: NEGATIVE mg/dL
Specific Gravity, Urine: 1.011 (ref 1.005–1.030)
Urobilinogen, UA: 0.2 mg/dL (ref 0.0–1.0)
pH: 7 (ref 5.0–8.0)

## 2010-08-07 LAB — CBC
HCT: 38 % (ref 36.0–46.0)
Hemoglobin: 13.7 g/dL (ref 12.0–15.0)
MCH: 34.1 pg — ABNORMAL HIGH (ref 26.0–34.0)
MCHC: 36.1 g/dL — ABNORMAL HIGH (ref 30.0–36.0)
MCV: 94.5 fL (ref 78.0–100.0)
Platelets: 165 10*3/uL (ref 150–400)
RBC: 4.02 MIL/uL (ref 3.87–5.11)
RDW: 14.7 % (ref 11.5–15.5)
WBC: 4.6 10*3/uL (ref 4.0–10.5)

## 2010-08-07 LAB — COMPREHENSIVE METABOLIC PANEL
ALT: 14 U/L (ref 0–35)
AST: 17 U/L (ref 0–37)
Albumin: 3.5 g/dL (ref 3.5–5.2)
Alkaline Phosphatase: 87 U/L (ref 39–117)
BUN: 17 mg/dL (ref 6–23)
CO2: 25 mEq/L (ref 19–32)
Calcium: 9.3 mg/dL (ref 8.4–10.5)
Chloride: 105 mEq/L (ref 96–112)
Creatinine, Ser: 1 mg/dL (ref 0.4–1.2)
GFR calc Af Amer: >60 mL/min (ref >60)
GFR calc non Af Amer: 58 mL/min — ABNORMAL LOW (ref >60)
Glucose, Bld: 83 mg/dL (ref 70–99)
Potassium: 4.3 mEq/L (ref 3.5–5.1)
Sodium: 139 mEq/L (ref 135–145)
Total Bilirubin: 0.2 mg/dL — ABNORMAL LOW (ref 0.3–1.2)
Total Protein: 7.2 g/dL (ref 6.0–8.3)

## 2010-08-07 LAB — DIFFERENTIAL
Basophils Absolute: 0.1 10*3/uL (ref 0.0–0.1)
Basophils Relative: 1 % (ref 0–1)
Eosinophils Absolute: 0.2 10*3/uL (ref 0.0–0.7)
Eosinophils Relative: 5 % (ref 0–5)
Lymphocytes Relative: 43 % (ref 12–46)
Lymphs Abs: 2 10*3/uL (ref 0.7–4.0)
Monocytes Absolute: 0.7 10*3/uL (ref 0.1–1.0)
Monocytes Relative: 16 % — ABNORMAL HIGH (ref 3–12)
Neutro Abs: 1.6 10*3/uL — ABNORMAL LOW (ref 1.7–7.7)
Neutrophils Relative %: 35 % — ABNORMAL LOW (ref 43–77)

## 2010-08-07 LAB — LIPASE, BLOOD: Lipase: 17 U/L (ref 11–59)

## 2010-08-07 MED ORDER — IOHEXOL 300 MG/ML  SOLN
100.0000 mL | Freq: Once | INTRAMUSCULAR | Status: AC | PRN
  Administered 2010-08-07: 100 mL via INTRAVENOUS

## 2010-08-08 ENCOUNTER — Ambulatory Visit: Payer: Medicare Other | Admitting: Internal Medicine

## 2010-08-09 NOTE — H&P (Signed)
NAME:  [name], [name]               ACCOUNT NO.:  [id number]   MEDICAL RECORD NO.:  [id number]          PATIENT TYPE:  INP   LOCATION:  1843                         FACILITY:  [facility]   PHYSICIAN:  Bruce Rexene Edison. Swords, M.D. [facility] OF BIRTH:  Aug 11, 1955   DATE OF ADMISSION:  05/28/2005  DATE OF DISCHARGE:                                HISTORY & PHYSICAL   CHIEF COMPLAINT:  Chest pain.   HISTORY OF PRESENT ILLNESS:  [name] is a 55 year old female with a  complicated medical history.  She has a 3-day history of chest pain which  she states feels like her previous pericarditis.  The pain has been  constant, waxes and wanes, and can be quite severe.  She has had some nausea  and a little bit of shortness of breath especially with exertion.  She  developed fevers yesterday which have persisted through today.  She denies  any cough, sore throat, dental pain, rashes, or any other localizing  symptoms other than her chest pain.  The pain seems to lessen when she takes  a deep breath, worsens if she coughs.  I am called to see the patient after  cardiology refused to admit the patient.   PAST MEDICAL HISTORY:  She has coronary artery disease, status post stents  x2, history of lupus which she says manifested itself as joint pains.  She  had a history of ITP, status post splenectomy and she has moderate to severe  mitral regurgitation.   MEDICATIONS:  1.  Protonix 40 mg p.o. daily.  2.  Levothyroxine 50 mcg p.o. daily.  3.  Altace 1.25 mg p.o. daily.  4.  Pravachol 20 mg p.o. daily.   ALLERGIES:  No known drug allergies.   FAMILY HISTORY:  Mother with heart disease.  She has two brothers deceased,  both with holes in their heart.   SOCIAL HISTORY:  She traveled to [place] February 14 through 24, no acute  illnesses while she was there.  She has two healthy children.  She has a  fiance.  She smokes one pack per day.  She does not drink alcohol.  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   REVIEW OF SYSTEMS:  As above.  She denies any other complaints in her review  of systems.   PHYSICAL EXAMINATION:  VITAL SIGNS:  Temperature 101.5, pulse 78,  respirations 14, blood pressure 110/60, there is no pulsus paradoxus.  HEENT:  Normocephalic and atraumatic, extraocular muscles intact.  NECK:  Supple without lymphadenopathy, thyromegaly, jugular venous  distention, or carotid bruits.  CHEST:  Clear to auscultation without any increased work of breathing.  No  dullness to percussion.  HEART:  S1 and S2 are regular.  I do not hear any murmurs, rubs, or gallops.  ABDOMEN:  Active bowel sounds, soft, and nontender.  There is no  hepatosplenomegaly.  No masses are palpated.  EXTREMITIES:  There is no cyanosis, clubbing, or edema.  NEUROLOGY:  She is alert and oriented without any motor or sensory deficits.   EKG demonstrates normal sinus rhythm.  I  do not see any significant ST or T  wave changes.   LABORATORY DATA:  Creatinine 1.4, sodium 134, hemoglobin 13.3, white count  12.3, sed rate 66, INR 1.1.   CAT scan demonstrates pericardial effusion and a thickened pericardium.   ASSESSMENT:  1.  Pericarditis.  2.  Fever.   I suspect her symptoms are related to pericarditis, likely lupus related,  but cannot rule out infectious, either bacterial or viral and an outside  possibility that this is malignant infusion (very unlikely).  The cardiology  consultant did not admit the patient, but did order some appropriate  laboratories.  I think she needs to be ruled out for myocardial infarction  given her history.  We will check an ANA.  I have called for rheumatology  advice and consult, but did not receive a call back.  I will try again later  this evening.  Given her fever and an unknown pericardial effusion, I think  she needs broad spectrum antibiotics and will check blood cultures.  I also  think she needs a PPD place and we will order that.  I will order an   echocardiogram to see if there are any echo findings consistent with  tamponade.  I doubt there will be.  If she seems relatively stable, she may  be able to leave in the near future with close outpatient follow-up.      Bruce Rexene Edison Swords, M.D. [facility]  Electronically Signed     BHS/MEDQ  D:  05/28/2005  T:  05/29/2005  Job:  [id number]   cc:   Madaline Savage, M.D.  Fax: [phone]

## 2010-08-09 NOTE — Cardiovascular Report (Signed)
[name], [name]                         ACCOUNT NO.:  [id number]   MEDICAL RECORD NO.:  [id number]                   PATIENT TYPE:  INP   LOCATION:  2031                                 FACILITY:  [facility]   PHYSICIAN:  Darlin Priestly, M.D.             DATE OF BIRTH:  03/06/1956   DATE OF PROCEDURE:  06/03/2002  DATE OF DISCHARGE:  06/04/2002                              CARDIAC CATHETERIZATION   PROCEDURES:  1. Left heart catheterization.  2. Coronary angiography.  3. Left ventriculogram.   ATTENDING PHYSICIAN:  Darlin Priestly, M.D.   COMPLICATIONS:  None.   INDICATIONS FOR PROCEDURE:  The patient is a 55 year old female patient of  Dr. Wyonia Hough with a history of acute anterior wall MI with PTCA and  stenting of the circumflex in January 2003 with PTCA and stenting of the RCA  in January 2003.  The patient had repeat catheterization with PTCA of a  diagonal in October 2003.  The patient was admitted on 06/03/2002, with  recurrent chest pain.  The patient also had a history of thrombocytopenia  secondary to ITP.  The patient is now referred for repeat catheterization to  reassess her coronary anatomy.   DESCRIPTION OF PROCEDURE:  After giving informed written consent, the  patient was brought to the cardiac catheterization lab where her right and  left groin were shaved, prepped, and draped in the usual sterile fashion.  ECG monitoring was established.  Using the modified Seldinger technique, a 6-  French arterial sheath was inserted in the right femoral artery.  6-French  diagnostic catheters were then used to perform diagnostic angiography.  This  reveals a large left main with no significant disease.  The LAD is a large  vessel that coursed to the apex and grafts to an OM-1 diagonal branch.  The  LAD is noted to have a 50% proximal bridging segment.  The remainder of the  LAD has no significant disease.  The first diagonal appears to be totally  occluded in its  ostial portion.  There is a small, second diagonal which  appears widely patent.   The left circumflex is a large vessel that coursed in the AV groove and  grafts to three obtuse marginal branches.  There is a widely patent AV  groove circ in the mid segment.  There is a 70% distal lesion in the AV  groove circ beyond the second obtuse marginal.   The first OM is a large vessel with a 70% ostial lesion arising within the  AV groove stented segment.  The second OM is a medium-sized vessel that  bifurcates distally and has a 50% proximal lesion.  The third OM has no  significant disease.   The right coronary artery is a medium-sized vessel which is dominant and  gives rise to the PDA.  The RCA is noted to have diffuse proximal and early  mid-disease up  to 60%.  There is a patent stent in the mid segment.  There  is a 60% distal RCA lesion.  The PDA has no significant disease.   Left ventriculogram reveals a preserved EF of 50% to 55% with moderately  severe mitral regurg.   HEMODYNAMICS:  Systemic arterial pressure 173/113, LV systemic pressure  172/24, LVEDP of 36.    CONCLUSION:  1. Significant two-vessel coronary artery disease.  2. Normal left ventricular systolic function.  3. Moderate to severe mitral regurgitation.  4. Systemic hypertension.  5. Elevated left ventricular end diastolic pressure.                                               Darlin Priestly, M.D.    RHM/MEDQ  D:  06/03/2002  T:  06/04/2002  Job:  [id number]   cc:   Rosalyn Gess. Norins, M.D. [facility]   Madaline Savage, M.D.  [address], [address]  [place]  [place] [address]  Fax: [phone]

## 2010-08-09 NOTE — Discharge Summary (Signed)
[facility]  Patient:    [name], [name]                      MRN: [id number] Adm. Date:  [id number] Disc. Date: 09/18/99 Attending:  Anastasio Auerbach CC:         [name]. [name], M.D.             [name], M.D.             [name]., M.D.                           Discharge Summary  DATE OF BIRTH:  [birth year]  DISCHARGE DIAGNOSES:  1. Acute pericarditis.     a. Likely lupus related.     b. Echocardiogram:  No effusion.  2. Pleuritis.     a. Likely lupus related.     b. Associated atelectasis.  3. Questionable left lower lobe pneumonia versus atelectasis.  4. Fever secondary to above, improved.  5. Hypoxemia secondary to above, improved.  6. Normocytic anemia.  Discharge hemoglobin 11.8, MCV 95.  7. Dehydration secondary to #1, #2, and #3, resolved.  8. Chronic constipation.  9. Systemic lupus, diagnosed in 1993.     a. History of pericarditis with near tamponade, 1996.     b. Associated rash and arthralgias.     c. Off prednisone and other medications since March, 2000. 10. Depression, diagnosed December 2000.  Responded to Paxil. 11. History of dog bite requiring right leg surgery, July 1998. 12. Tobacco use.  One pack per day x 25 years. 13. Gastroesophageal reflux disease. 14. No known drug allergies.  DISCHARGE MEDICATIONS:  1. (New) prednisone 20 mg two pills daily until follow-up with Dr. Kellie Adams     (approximately one week) - Dr. Kellie Adams will taper as indicated.  2. (New) Tequin 400 mg q.d. x 12 more days (total 14-day therapy).  3. (New) Humibid LA 600 mg two q.12h. x 12 days.  4. (New) Albuterol MDI with spacer two puffs q.6h. p.r.n. wheezing.  5. (New) Metamucil daily.  6. (New) Dulcolax suppository q.d. p.r.n. constipation.  7. Vicodin 5/500 one to two q.4h. p.r.n. breakthrough pain.  8. Paxil 10 mg q.d.  9. Zantac 150 mg, increased from q.d. to b.i.d. while on prednisone.  CONDITION UPON DISCHARGE:   Stable.  [name] has markedly decreased symptoms of pain.  Her fever is down.  Her oxygen saturation is 94% on room air.  No audible rubs on exam.  DISPOSITION:  The patient will be discharged home with her friend.  RECOMMENDED ACTIVITY:  The patient is to be up and around the house but should stay out of work through Friday, June 29.  RECOMMENDED DIET:  Low salt, plenty of liquids.  SPECIAL INSTRUCTIONS:  1. The patient was instructed to use her incentive spirometry every hour     while awake in order to combat atelectasis.  2. The patient was instructed to contact Dr. Georgina Adams if her high fevers     returned, if the pain got worse, she got more short of breath, or had     other problems.  FOLLOW-UP:  1. The patient is to contact Dr. Kellie Adams this Monday to schedule an     appointment to see him as soon as possible (within a few days).     Dr. Kellie Adams is out of town at  this time.  I did convey this message to     Dr. Theda Adams who is taking calls for him.  2. Dr. Lajean Adams on Wednesday, July 11, at 2:30.  He should follow up on     her constipation symptoms as well as her respiratory symptoms.     [name] was also anemic this admission and that should be followed up,     as well.  CONSULTANTS:  None.  PROCEDURES:  1. Chest x-ray (June 23) from [facility]) minimal lingular     subsegmental atelectasis versus scarring.  2. Chest x-ray (June 25) left lower lobe pneumonia with small left effusion.     Right basilar atelectasis.  Poor inspiration.  3. Chest CT with contrast (June 25):  No evidence of central pulmonary     embolus.  Small pericardial effusion.  Moderate left pleural effusion with     associated atelectasis/pneumonia in the left lower lobe.  Mild right lower     lobe atelectasis.  No evidence of deep venous thrombosis.  4. Chest x-ray (June 26):  Slight improvement in the aeration on the right     with persistent change at the left base.  Pneumonia  versus atelectasis     plus subpleural effusion.  5. Electrocardiogram normal sinus rhythm.  Slightly low voltage.  6. Two-dimensional echocardiogram (June 25):  No pericardial effusion.  Left     ventricle normal in size with mild concentric left ventricular     hypertrophy.  Ejection fraction normal.  Anterior fat pad visualized.     Mild aortic regurgitation.  Mild tricuspid regurgitation.  HOSPITAL COURSE: #1 - PERICARDITIS/PLEURITIS:  [name] is a 55 year old African-American female with a history of lupus who is currently on only Paxil and Zantac.  She had lupus pericarditis back in 1996 with a fairly significant pleural effusion and responded to steroid therapy.  She presented to the [facility] emergency department on June 23 complaining of anterior pleuritic chest pain.  Workup there was apparently unremarkable and she was given Vicodin.  This did not help with the pain and she presented to the [facility] emergency department on June 25.  She was now having pain in her anterior chest up to her left shoulder.  It was pleuritic in nature.  She described it feeling exactly like her pericarditis before.  Spiral CT was obtained which showed no evidence of PE.  It did show a small pericardial effusion as well a left pleural effusion with associated atelectasis versus pneumonia.  I discussed the signs of the effusion with the radiologist who felt that there was probably 100 to 200 cc in there and would not necessarily be amenable to thoracentesis.  [name] also had associated fevers and a mild cough.  She was admitted and placed on antibiotics (Tequin) as well as IV Solu-Medrol. Her cough diminished.  She did have associated hypoxia which I suspect was due to atelectasis secondary to splinting related to the pain from the pleurisy and pericarditis.  Regardless, I did opt to treat her for community-acquired pneumonia with 14 days of Levaquin.  She will be discharged on  40 mg of  prednisone and will follow up with Dr. Kellie Adams next week.  Pertinent lab work done this admission included a sed rate which was 68 (nonspecific) and a titer was 640 with a speckled pattern.  Rheumatoid factor was less than 20.  Given my suspicions for lupus-related inflammation, I will defer any recommendations  of further workup to Dr. Kellie Adams.  He is out of town this week but will see the patient next week.  She is to contact Dr. Georgina Adams if she develops more pain, fevers, shortness of breath, or has other problems or questions.  #2 - ANEMIA:  Mrs. Mcmichen was dehydrated on admission and had a hemoglobin of 13.2.  After hydration, it fell to 11.8 with an MCV of 95.  She had a mild macrocytosis with an MCV of 100 initially.  B12 was checked and was normal. RBC folate is pending at the time of discharge.  I suspect this drop was due to acute illness.  Will recommend that Dr. Georgina Adams follow this up as an outpatient.  #3 - DEHYDRATION:  Mrs. Senteno was aggressively hydrated this admission and, upon discharge, was taking fluids adequately without symptoms of orthostasis.  #4 - CONSTIPATION:  Mrs. Kiger has had problems with constipation over the past couple of years.  I advised her to use Metamucil and Dulcolax suppository and she will discuss further interventions with Dr. Georgina Adams.  OTHER PERTINENT LABORATORY DATA:  TSH 1.87, blood cultures no growth (they were drawn after IV Tequin was given in the emergency room), cardiac enzymes negative.  Vitamin B12 437.  Urinalysis:  Specific gravity greater than 1.040.  Negative except for 1.0 urobilinogen.  DISCHARGE LABORATORY DATA:  Hemoglobin 11.8, MCV 95, WBC 8.4, platelet count 197, 81% neutrophils initially, 93% after IV Solu-Medrol.  Sodium 137, potassium 4.4, chloride 105, bicarbonate 23, BUN 10, creatinine 1.1, glucose 110, calcium 8.3, albumin 2.6.  LFTs otherwise normal. DD:  09/18/99 TD:  09/18/99 Job: [id number]

## 2010-08-09 NOTE — Cardiovascular Report (Signed)
NAME:  [name], [name]                         ACCOUNT NO.:  [id number]   MEDICAL RECORD NO.:  [id number]                   PATIENT TYPE:  INP   LOCATION:  6524                                 FACILITY:  [facility]   PHYSICIAN:  Cristy Hilts. Jacinto Halim, M.D.                  DATE OF BIRTH:  06/13/1955   DATE OF PROCEDURE:  12/28/2001  DATE OF DISCHARGE:                              CARDIAC CATHETERIZATION   PROCEDURE PERFORMED:  1. Left ventriculography.  2. Selective right and left coronary arteriography.  3. Abdominal aortogram with femoral runoff.   INDICATIONS FOR PROCEDURE:  The patient is a 55 year old female with a  history of coronary artery disease status post myocardial infarction and  PTCA in January 2003 of the circumflex coronary artery and also PTCA of the  right coronary artery.  Presents with symptoms again suggestive of unstable  angina.  Due to her significant cardiac history, she was brought to the  cardiac catheterization lab to evaluate her coronary anatomy.   HEMODYNAMIC DATA:  1. The left ventricular pressure was 117/6 with an end diastolic pressure of     15 mmHg.  2. The aortic pressure was 119/37 with a mean of 96 mmHg.  3. There is no pressure gradient across the aortic valve.   ANGIOGRAPHIC DATA:  1. Left ventricle:  The left ventricular systolic function was in the low     normal and estimated at 50-55%.  There was no significant mitral     regurgitation.  2. Right coronary artery:  The right coronary artery is a large caliber     vessel.  It has mild diffuse disease.  A previously placed 2.75 x 18-mm     stent on April 14, 2001, shows mild intimal hyperplasia constituting     20%, to at most 30%, in-stent restenosis.  3. Left main coronary artery:  The left main coronary artery is a large     caliber vessel.  It is normal.  4. Left circumflex:  The left circumflex coronary artery is a large caliber     vessel.  It gives origin to a very large obtuse marginal  #1.  The mid     circumflex stent which was placed on 04/11/01 for acute myocardial     infarction was a 3.0 x 20-mm Express and is widely patent.  The AV groove     circumflex ostium which had shown some narrowing in January appears     remodeled and no severe luminal narrowing is noted at this time.  5. Left anterior descending:  The left anterior descending artery is a large     caliber vessel.  It gives origin to a very large diagonal #1 branch.  The     LAD itself has mild diffuse disease.  The diagonal #1 has about 95%     stenosis.  It did have significant progress from 70%  stenosis noted in     January 2003.  This has a heavy plaque burden.  6. Abdominal aortogram with femoral follow-through:  The abdominal aortogram     with femoral follow-through revealed widely patent renal arteries.  There     is one renal artery on either side.  The iliacs and femorals were widely     patent.   IMPRESSION:  1. New diagonal #1 severe disease which has progressed from 70% to present     95-99% stenosis.  Appears to have heavy plaque burden.  2. Widely patent circumflex stent placed on April 11, 2001 (3.0 x 20     Express).  Patent Velocity stent placed on April 14, 2001, in the right     coronary artery with a 2.75 x 18-mm stent with mild intimal hyperplasia.   RECOMMENDATIONS:  Based on the anatomy, the patient will need further  cardiac intervention for the diagonal #1 branch of the left anterior  descending artery.  This is slightly a large branch; however, the patient's  platelet count is low, hence patient will be admitted to the hospital and  platelet count will be followed through.  If the platelet counts increase,  she will undergo planned intervention in the morning, otherwise as an  outpatient basis.  Further recommendations to follow.   TECHNIQUE OF PROCEDURE:  Under usual sterile precautions using #6 French  right femoral arterial access, #6 [place] Multipurpose B2 catheter  was  advanced to the ascending aorta over a 0.035-inch J wire.  The catheter was  then gently advanced to the left ventricle and left ventricular pressures  were monitored.  Hand contrast injection of the left ventricle was performed  both in the LAO and RAO projection.  The catheter was flushed with saline  and pulled back into the ascending aorta.  Pressure gradient across the  aortic valve was monitored.  The right coronary artery was selectively  engaged and angiography was performed.  In a similar fashion, the left main  coronary artery was selectively engaged and angiography was performed.   Because of inability to only partly visualize the left main with the  Multipurpose B2, the catheter was pulled out and abdominal aortogram  performed and exchanged to a #6 [place] diagnostic Judkins left catheter.  The left main coronary artery was selectively engaged and angiography was  performed.  Then the catheter was pulled out of the body in the usual  fashion.  The patient was transferred to the recovery room in stable  condition.  The patient tolerated the procedure well.  No complications  occurred.                                                Cristy Hilts. Jacinto Halim, M.D.    Pilar Plate  D:  12/28/2001  T:  12/30/2001  Job:  [id number]   cc:   Jocelyn Lamer D. Pecola Leisure, M.D.   [facility]

## 2010-08-09 NOTE — Op Note (Signed)
   NAME:  [name], SCHWEIGER                         ACCOUNT NO.:  [id number]   MEDICAL RECORD NO.:  [id number]                   PATIENT TYPE:  INP   LOCATION:  5509                                 FACILITY:  [facility]   PHYSICIAN:  Danice Goltz, M.D. [facility]            DATE OF BIRTH:  Sep 24, 1955   DATE OF PROCEDURE:  06/20/2002  DATE OF DISCHARGE:                                 OPERATIVE REPORT   PROCEDURE:  Video bronchoscopy.   INDICATIONS FOR PROCEDURE:  Right upper lobe necrotizing/cavateric process  in a patient who is immune compromised due to high doses of steroids for  systemic lupus erythematosus and idiopathic thrombocytopenia.   DESCRIPTION OF PROCEDURE:  The patient had the benefits, potential  complications, limitations of the procedure explained. The patient agreed to  proceed with the same. The patient received fentanyl 75 micrograms and  Versed 5 micrograms IV for premedication as well as Cetacaine for posterior  pharyngeal analgesia.  The patient had the Olympus video bronchoscope passed via the oral route.  The vocal cords were noted to be normal. The choana was sharp. We then  proceeded to examine the left tracheobronchial tree which was entirely  normal. The bronchoscope was then advanced into the right tracheobronchial  tree which was again noted to be normal with the exception of a right upper  lobe having a normal variant of subsegmental bronchi.   At this point fluoroscopy was used to obtain brushings and washings and  lavage from the right upper lobe. A process could be seen under fluoroscopy  which corresponded to the right apical subsegments. Brushings were done x2  and sent for cytology and for microbiology studies to include AFB, fungal,  Legionella, PCP and regular cultures.   We then proceeded to do a lavage. The bronchoscope was wedged on the most  apical subsegment of the right upper lobe, where  sequential 20 cc aliquots  of lavage saline was used and  yielded 40 cc of sampling. Biopsies were not  done due to the patient's low platelet count of 55,000.   At this point the procedure was terminated. The bronchoscope was retreated  and the patient was taken to the recovery are without incident.   IMPRESSION:  Right upper lobe cavitary/necrotizing lesion, rule out  opportunistic infection in a patient with immune compromise.   PLAN:  The plan will be to await for cytology and for microbiology studies.                                                 Danice Goltz, M.D. [facility]    LG/MEDQ  D:  06/20/2002  T:  06/20/2002  Job:  [id number]

## 2010-08-09 NOTE — Consult Note (Signed)
NAME:  [name], [name]                         ACCOUNT NO.:  [id number]   MEDICAL RECORD NO.:  [id number]                   PATIENT TYPE:  INP   LOCATION:  1914                                 FACILITY:  [facility]   PHYSICIAN:  Samul Dada, M.D.            DATE OF BIRTH:  Jul 23, 1955   DATE OF CONSULTATION:  03/16/2002  DATE OF DISCHARGE:                                   CONSULTATION   REASON FOR CONSULTATION:  Platelet count of less than 5,000.   HISTORY OF PRESENT ILLNESS:  This is a 55 year old female with a 10-year  history of autoimmune disease diagnosed with lupus.  Diagnosis of lupus now  changed to mixed connective tissue disorder.  Presented to the emergency  room at [facility] with uncontrolled nose bleed.  She has had frequent nose  bleeds since November 1.  She denies any excessive bruising or any skin  changes.  The nose bleeds have been brief, stopping after several minutes  until this one this morning.  She has a strong family history of coronary  artery disease and had her first myocardial infarction in January 2003  followed by double stent placement.  In October of this year she received an  angioplasty which at first was delayed due to thrombocytopenia with  platelets at 75,000.  There was no work-up done at that time that we know of  according to the patient.   PAST MEDICAL HISTORY:  1. Myocardial infarction January 2003.  2. Lupus diagnosed approximately 10 years ago.  3. Angina.  4. Thrombocytopenia.   PAST SURGICAL HISTORY:  1. Cardiac catheterization in January 2003.  2. Angioplasty October 2003.  3. Stent placement in January 2003.   ALLERGIES:  No known drug allergies.   MEDICATIONS:  1. Altace 1.25 mg p.o. q.d.  2. Atenolol 25 mg one p.o. q.d.  3. ASA 325 mg one p.o. q.d.  Last dose was on March 16, 2002.  4. Zocor 10 mg p.o. q.d.  5. Wellbutrin SR 150 mg q.d.  6. Nitroglycerin 0.4 mg sublingual as directed.  7. Protonix 40 mg one p.o.  q.d.   FAMILY HISTORY:  The patient's mother is alive at 81.  She has Paget's  disease and a pacemaker.  She has one sister who is alive and well.  Her  father is alive with coronary artery disease for the past three years.  She  has two brothers.  Both died as infants secondary to heart disease.  She has  a maternal grandmother who died of coronary.  She also had diabetes  mellitus.  A maternal grandmother is alive and well.  She has a paternal  grandfather who also died of coronary disease.   SOCIAL HISTORY:  She is divorced.  She lives in [place] and works at  [employer] in [place].  She smokes less than one-half a pack a day  for the past 20  years for 10-pack-year history.  She denies ethanol use or  illicit drug use.  She has two sons, age 83 and 11, the 49 year old with  cerebral palsy who is confined to a wheelchair, but able to work.   REVIEW OF SYSTEMS:  She has daily headaches.  She had blurred vision for  seven to eight months.  She has no dyspnea, no chest pain, no pleuritic  pain.  She has had increased indigestion and history of GERD.  She denies  any nausea or vomiting.  Her bowels are irregular.  She has had a recent  increase in fatigue over the past several weeks since around November 1.  She has had frequent nose bleeds since that time also.  She denies any  weakness.  Her nose started bleeding around 10 a.m. today and she has a  recent bruise on her right hip.   PHYSICAL EXAMINATION:  GENERAL:  This is a 55 year old African-American  female in no acute distress.  VITAL SIGNS:  Temperature 98, pulse 79, respirations 18, blood pressure  114/80.  HEENT:  Normocephalic, atraumatic.  Sclerae anicteric.  Oropharynx moist  with petechial changes in the soft palate area.  NODES:  There are no cervical, supraclavicular, axillary, or inguinal nodes  appreciated.  LUNGS:  Clear to auscultation bilaterally.  CARDIOVASCULAR:  Regular rate and rhythm.  No murmur.   No gallop.  ABDOMEN:  Soft, nontender.  Left upper quadrant with notable splenomegaly.  EXTREMITIES:  No clubbing, cyanosis, edema.  SKIN:  No obvious petechiae.  NEUROLOGIC:  Alert and oriented.  Good historian.  Strength is 5/5.  DTRs  not assessed due to low platelets.   LABORATORIES:  WBC is 2.7, hemoglobin 12.8, hematocrit 36.9, platelets less  than 5000.  Reported to Carren Rang, M.D. by the laboratory as less than  2000.  Sodium is 137; potassium is 4.0; chloride is 109, CO2 23; glucose is  88, BUN 14, creatinine 1, calcium 9.9.   ASSESSMENT/PLAN:  This 55 year old African-American female with 10-year  history of autoimmune diagnosis currently without symptoms of pain.  She has  a three month history of nose bleeds with nose bleed more severe today  requiring a trip to the emergency room.  The nose bleed was controlled  rapidly with silver nitrate.  She does have daily use of an aspirin due to a  history of coronary artery disease.  She denies any changes in her  medications over the past year.  The patient was seen and examined by Samul Dada, M.D. who notes:  1. Decreased platelets probably ITP.  Will give a trial of intravenous Solu-     Medrol and IVIG.  The patient is anxious to go home.  We will hold     aspirin for now.  If there is no response we will proceed with a bone     marrow.  2. Decreased wbc's noted in October 2003 also.  Suspect secondary to drug,     possibly Altace.  3. Autoimmune disease, questionable lupus versus mixed connective tissue     disorder.  Will check an ANA, rheumatoid factor, complement, etc. and     request a rheumatology consult.  4. Increased PTT.  Suspect lupus anticoagulant.  We will check further     studies, beta 2, glycoprotein, AV factor 8, and consider chronic Coumadin     therapy.  5. Anemia.  Will check laboratories, stools. 6. Coronary artery disease status post myocardial infarction.  We  will     request Madaline Savage, M.D. consult.  7. Elevated lipids.  Madaline Savage, M.D. to follow.  8. History of seizures.  We will check an MRI of the brain.  We will try to     consult primary care physician to coordinate her care.    Rosemarie Ax, N.P.                      Samul Dada, M.D.    Cordelia Pen  D:  03/18/2002  T:  03/18/2002  Job:  [id number]   cc:   Madaline Savage, M.D.  [address], [address]  [place]  [place] [address]  Fax: [phone]   Betti D. Pecola Leisure, M.D.  [phone] [address]  [place]  [place] [address]  Fax: [phone]   Lemmie Evens, M.D.  [address]  [place]  [place] [address]  Fax: [phone]

## 2010-08-09 NOTE — Discharge Summary (Signed)
[place]  Patient:    Meghan, Adams Visit Number: [id number] MRN: [id number]          Service Type: MED Location: [facility] [id number] Attending Physician:  Ophelia Shoulder Dictated by:   Marya Fossa, P.A. Admit Date:  04/11/2001 Discharge Date: 04/17/2001   CC:         Leilani Able, M.D.  Beulah Gandy. Royetta Asal, M.D.   Discharge Summary  DATE OF BIRTH:  [birth year]  ADMISSION DIAGNOSES: 1. Probable acute myocardial infarction (subendocardial myocardial    infarction) - lateral electrocardiogram changes. 2. Mixed connective tissue disorder. 3. History of depression. 4. Ongoing tobacco abuse. 5. Unknown lipid status. 6. Family history of coronary artery disease.  DISCHARGE DIAGNOSES:  1. Circumflex myocardial infarction.  2. Coronary artery disease, status post urgent cardiac catheterization     revealing two vessel coronary artery disease with an occluded mid     circumflex culprit - status post successful intervention.  3. Hypokalemia repleted.  4. Hypertension - treated.  5. Ejection fraction 55%.  6. Blood loss anemia - stable.  7. Low grade temperature, probably secondary to myocardial infarction.     Urine culture negative.  8. Mixed connective tissue disorder.  9. Pancytopenia - questionable etiology - improving at discharge. 10. Coronary artery disease with acute myocardial infarction with     intervention to the circumflex, staged right coronary artery     percutaneous catheter insertion.  HISTORY OF PRESENT ILLNESS:  [name] is a 55 year old, African-American female with a past medical history of mixed connective tissue disorder diagnosed in 1993 and followed by Dr. Royetta Asal.  She has a history of sarcoidosis and pleuritis in July 2001 as well as a history of prior pericardial effusion with near tamponade remotely.  The patient presented after a history of chest pressure beginning around 11:00 this morning  lasting all day with shortness of breath, nausea, vomiting, diaphoresis with radiation to both arms and shoulder blades. It was relieved with two nitroglycerin.  No similar symptoms before this.  The chest pain was 10/10.  Her risk factors include tobacco and family history.  In the emergency room cardiac enzymes were positives and EKG suggests 0.5 to 1 mm upslopping ST in the inferior leads with T wave inversions in aVL, V1 and 2.  Repeat EKG when chest pain free shows sinus rhythm with resolved T wave changes  Chest x-ray is nonacute.  The patient will be admitted for IV heparin, nitroglycerin if blood pressure will tolerate and aspirin therapy.  Will start statin.  Closely monitor. If the patient develops further chest pain will start tP3A and cardiac catheterization.  PROCEDURES: 1. Urgent heart catheterization on April 11, 2001 by Dr. Chanda Busing. 2. Staged right and left heart catheterization on April 14, 2001 with    RCA intervention. 3. 2D echocardiogram.  COMPLICATIONS:  None.  CONSULTATIONS:  Cardiac rehab.  HOSPITAL COURSE:  [name] was evaluated in the emergency room by Dr. Domingo Sep and was found to have acute myocardial infarction. Initially plans were to postpone cardiac catheterization until April 12, 2001.  However, the patient continued to have chest pain despite being on IV heparin and plans were made to go the cardiac catheterization laboratory urgently.  She could not use nitroglycerin due to systolic blood pressure in the 80s after sublingual nitroglycerin.  Admission labs showed a potassium of 3.5, BUN 13, creatinine 1.0, INR 1.2. Admission hemoglobin details hematocrit 40.3 and platelets 217.  Cardiac enzyme series as follows:  CK 1438, 3272, 2915, 231.  MB 265, 495.6, 339.6, 3.3. Troponin I 10.28, 108.98, 77.00.  The patient was taken urgently to the cardiac catheterization laboratory on April 11, 2001 because of persistent chest pain.   Cardiac catheterization by Dr. Elsie Lincoln revealed a normal left main, LAD widely patent with a diagonal one showing a proximal 90% segmental lesion (small vessel).  Circumflex was totally occluded in its mid portion after the take off of the OM 1 and there was collateralization to the OM 2 from the OM 1.  The RCA had a 40% proximal, 75% mid and 40% distal lesion.  EF 55% with mild inferior hypokinesis in the RAO view.  Dr. Elsie Lincoln proceeded with intervention to the circumflex OM 1 lesion ultimately using the 3.0 x 20 mm express stent reducing the lesion from 100 to 0%.  The patient tolerated the procedure well.  Plavix was given in the catheterization laboratory and integrilin was bolused and infused for a 24 hour infusion and dopamine 3 cc an hour was initiated for hypotension with a goal of keeping systolic blood pressure greater than 100 and plans to wean by 1 cc at a time as long as the blood pressure remained over 100.  Heparin was also restarted post catheterization by pharmacy protocol.  On April 12, 2001 the patient had no further chest pain but had a headache from nitroglycerin  Blood pressure remained in the 80s/40s and pulse rate in the 40s and afebrile.  Right groin with mild bruising but no hematoma.  Post catheterization labs showed a hemoglobin of 10.8, hematocrit 31.5, potassium 3.2, BUN 6 and creatinine 1.0.  Hypokalemia was repleted and IV fluids increased for hypotension.  The patient was still on dopamine.  2D echocardiogram ordered to evaluate LV function.  2D echocardiogram results showed dynamic LV with cavity collapse, EF 65% and mildly thickened LV wall.  Mild MR, mild TR.  No pericardial effusion.  On April 13, 2001 the patient continued to remain stable.  Blood pressures in the 95s to 115s systolic. Potassium improved from 32 to 3.9.  BUN 5,  creatinine 11 and hemoglobin stable at 10.8.  Heparin was discontinued.  The patient had a low grade temperature of  100.2 and UA, C&S was ordered. Dopamine was weaned off but IV fluids were continued to maintain blood pressure.  Low dose beta blocker therapy was initiated with parameters for hypotension.  On April 14, 2001 the patient complained again of chest pain and was treated with sublingual nitroglycerin.  EKG was ordered and showed no acute abnormalities.  Sublingual nitroglycerin relieved discomfort.  Dr. Elsie Lincoln recommended repeat cardiac catheterization to evaluate her chest pain.  It was felt that low grade temperature was related to MI.  Urine cultures were negative.  The patient had mild pancytopenia felt to be primarily procedural related and anticoagulation related.  Of note, Dr. Elsie Lincoln did contact the patients place of employment and the staff nurse phoned the patients blood pressures generally run in the 90 to 106 systolic range.  The patient underwent repeat cardiac catheterization on April 14, 2001 by Dr. Elsie Lincoln.  This revealed a normal left main, normal LAD, and 90% diagonal lesion as mentioned before.  The circumflex stent was widely patent with no evidence of restenosis.  The RCA lesion was again 75 to 80%.  Dr. Elsie Lincoln decided to stent this lesion in the event that this was also a cause of the problems.  He used a  2.75 x 18 mm BX velocity stent reducing the lesion to 0%. EF 55% with mild hypokinesis inferiorly.  The patient tolerated the procedure well.  Continued to have low grade temperature but urine cultures were negative for growth.  Hemoglobin improved to 10.1.  White blood cell count improved to 5.1 (from 3.3) and platelets 128.  The patient was see by cardiac rehab and was educated on smoking cessation, diet, exercise and nitroglycerin use as well as calling 911.  On April 16, 2001 the patient continued to be stable, had no fever and pancytopenia was improving.  She was ultimately stable for discharge to home on April 17, 2001.  DISCHARGE MEDICATIONS: 1.  Toprol XL 25 mg q.d. 2. Altace 1.25 mg q.d. 3. Plavix 75 mg q.d. x1 month. 4. Aspirin 81 mg q.d. 5. Zocor 20 mg 1/2 pill q.d. 6. Nu-Iron 150 b.i.d. 7. Zantac p.r.n. 8. Nitroglycerin p.r.n. chest pain.  SPECIAL INSTRUCTIONS:  No strenuous activity, lifting more than five pounds or driving for two days.  No sexual activity for two days.  She will out of work until seen by Dr. Elsie Lincoln.  Low fat, low cholesterol, low salt diet.  May shower.  She is to do outpatient cardiac rehab.  She needs to call Dr. Reino Kent office Monday for a two week follow up appointment.  Of note, fasting lipid profile in the hospital showed a total cholesterol of 133, triglyceride 70, HDL 39 and LDL 80. Dictated by:   Marya Fossa, P.A. Attending Physician:  Ophelia Shoulder DD:  05/12/01 TD:  05/13/01 Job: [id number]

## 2010-08-09 NOTE — Discharge Summary (Signed)
[name], [name]                         ACCOUNT NO.:  [id number]   MEDICAL RECORD NO.:  [id number]                   PATIENT TYPE:  INP   LOCATION:  1610                                 FACILITY:  [facility]   PHYSICIAN:  Samul Dada, M.D.            DATE OF BIRTH:  1956-01-17   DATE OF ADMISSION:  03/16/2002  DATE OF DISCHARGE:  03/18/2002                                 DISCHARGE SUMMARY   DISCHARGE DIAGNOSES:  1. Severe thrombocytopenia secondary to idiopathic thrombocytopenic purpura.  2. Systemic lupus erythematosus.  3. Leukopenia.  4. Anemia with positive Coombs test.  5. Lupus anticoagulant with prolonged PTT.  6. History of acute myocardial infarction in January 2003.  7. Hyperlipidemia.  8. Possible seizure disorder.   PROCEDURE:  MRI of the brain on March 18, 2002.   HISTORY OF PRESENT ILLNESS:  The patient is a 55 year old African-American  female who was admitted with a three-month history of epistaxis occurring  from the left nostril.  This would occur every other day.  The patient had  severe epistaxis on the day of admission and presented to the emergency  room.  She estimated that she had lost about a pint of blood.  There were  blood clots.  Bleeding was controlled with silver nitrate.  Epistaxis had  been increasing in recent days.  The patient had also noted some atraumatic  bruising with no other signs of bleeding.  She denied any excessive bleeding  with her periods.  The patient had been noted to have a somewhat low  platelet count in October 2003, with a platelet count of around 70-80,000.  She had been diagnosed with lupus approximately 10 years ago, had been on  prednisone from 1993 to 2001.  She suffered weight gain during that time.  Other symptoms include Raynaud's phenomenon; some arthritis involving her  knees, elbows, ankles.  She has had a facial rash.  She gave a history  suggesting seizures that had occurred about two years ago.   The patient also  had pericarditis and pleuritis in June 2001.  The patient presented to the  emergency room with a platelet count of less than 5000.   PHYSICAL EXAMINATION:  On physical exam she seemed alert.  Was a good  historian.  In no acute distress.  Weight was 200 pounds, height 5 feet 6-  1/2 inches.  There were a few petechiae across the upper chest.  No purpura.  Epistaxis had ceased.  There were no oral lesions or purpura.  The rest of  the physical exam was basically unremarkable.   LABORATORY DATA:  Initial CBC:  White count was 2.7, hemoglobin 12.8,  hematocrit 36.9, platelets were less than 5000, MCV was 97.6.  After a bag  of phoresed platelets the platelet count went to 8000.  On March 18, 2002, at the time of discharge, the platelet count was 20,000 with a  hemoglobin of 11.1, hematocrit 32.5, and white count 7.1.  Initial  differential:  There were 48% polys, 30% lymphs, 16% monocytes, 5%  eosinophils, 1% basophils.  Sedimentation rate was 95, PT 13.6, with an INR  of 1, PTT was 50.  Factor VIII activity was 223.  Mixing studies showed that  the patient's prolonged PTT was not corrected suggesting an inhibitor.  Further workup disclosed that the lupus anticoagulant was present.  Chemistries were normal.  Albumin was 3.5.  Liver function tests were  normal.  Total protein 7.0.  HIV was nonreactive.  C3 was low at 73, C4 was  low at 12.  Haptoglobin was normal at 105.  Urinalysis was negative for  protein and glucose.  Rheumatoid factor was less than 20.  ANA was present  in a titer of 1:360 with a homogeneous pattern.  Anti-DNA was negative.  Direct Coombs was positive with IgG present.  Coombs with complement was  negative.  Beta-2, glycoprotein-1 antibody results were pending at the time  of discharge.   The results of the brain MRI from March 18, 2002, were negative.  There  was no abnormal enhancement.   HOSPITAL COURSE:  In the emergency room the patient  was given a bag of  phoresed platelets.  Her platelet count increased only marginally, up to  8000.  The patient was started on intravenous Solu-Medrol 40 mg every eight  hours, also started on intravenous immunoglobulin 90 g per day for two days.  The patient was felt to have ITP in association with systemic lupus.  She  was noted to have a positive direct Coombs without evidence of hemolysis and  the lupus anticoagulant.  Complement levels were low.  The patient's course  in the hospital was uneventful.  There were no problems related to her  treatments.  On March 18, 2002, the day of discharge, the patient's  platelet count was 20,000.  The patient was discharged.   MEDICATIONS:  Prednisone 60 mg daily.  Other medicines at the time of  admission were resumed with the exception of aspirin.  Those medicines were  as follows:  1. Altace 1.25 mg daily.  2. Atenolol 25 mg daily.  3. Zocor 20 mg daily.  4. Wellbutrin SR 150 mg daily.  5. Nitroglycerin 0.4 mg as needed.  6. Protonix 40 mg daily.   FOLLOW-UP:  The patient was going to follow up with Dr. Arline Asp on Tuesday,  March 22, 2002, for monitoring of her CBC.   CONDITION ON DISCHARGE:  Improved.  There were no problems during her  hospitalization with bleeding, specifically epistaxis.   DIET:  No restrictions.   ACTIVITY:  No restrictions.                                               Samul Dada, M.D.    DSM/MEDQ  D:  04/25/2002  T:  04/25/2002  Job:  [id number]

## 2010-08-09 NOTE — Discharge Summary (Signed)
[name], [name]                         ACCOUNT NO.:  [id number]   MEDICAL RECORD NO.:  [id number]                   PATIENT TYPE:  INP   LOCATION:  2031                                 FACILITY:  [facility]   PHYSICIAN:  Rene Paci, M.D. [facility]          DATE OF BIRTH:  [birth year]   DATE OF ADMISSION:  DATE OF DISCHARGE:  06/04/2002                                 DISCHARGE SUMMARY   DISCHARGE DIAGNOSES:  1. Abnormal Cardiolite.  2. Moderate to severe mitral regurgitation.  3. Idiopathic thrombocytopenic purpura.  4. Splenomegaly.   ADMISSION HISTORY:  The patient is a 55 year old African American female who  presented to the [facility] Emergency Department with complaints of chest  pain.  Onset at 9:30 on the evening of admission.  This radiated to her left  neck.  She took nitroglycerin with minor relief.  She had one nitroglycerin  with EMS with complete relief.   PAST MEDICAL HISTORY:  1. SLE.  2. ITP.  Last chemotherapy on May 24, 2002.  She was given Rituxan.  3. Coronary artery disease status post catheterization in November 2003 with     PTCA  ______ to the ostium and to the proximal diagonal.  Previous stent     January 2003 to the RCA.   HOSPITAL COURSE:  Problem 1.  Cardiovascular.  The patient presented with  atypical chest pain.  Serial cardiac enzymes were negative.  EKG was without  ischemia.  MI was ruled out.  We did have [facility]  evaluate the patient.  The patient's Cardiolite was abnormal.  Therefore  they felt that she needed a cardiac catheterization.  Restricting the timing  of the catheterization was her thrombocytopenia.  They wanted her to have a  normal bleeding time.  On June 03, 2002 the patient was able to undergo  catheterization.  This revealed moderate to severe MR but no obstructive  disease.   Problem 2.  ITP in a patient with SLA.  She presented extremely  thrombocytopenic.  This was despite being treated  with prednisone and  Rituxan.  Hematology put her on IV steroids.  They are also considering a  splenectomy.  The patient's platelet count has improved prior to discharge  and she was going to return for a splenectomy.   LABORATORY DATA:  A 2-D echo prior to discharge with an EF of 55-65%.  Hypokinesis of the inferior wall could not be ruled out.  She had mild  inferior hypokinesis and an acentric MR suspecting capillary muscle  dysfunction.  White count is 8.7, hemoglobin 12, platelet count was 96,000.  Labs are normal.  AST was 39, ALT 180, alkaline phosphatase 136.   DISCHARGE MEDICATIONS:  1. Atenolol 25 mg daily.  2. Wellbutrin 150 mg daily.  3. Protonix 40 mg daily.  4. Zocor 10 mg daily.  5. Altace 2.5 mg b.i.d.  6.  Imdur 30 mg daily.  7. Cardizem 50 mg daily.   FOLLOW UP:  With Dr. Debby Bud in two weeks and Dr. Arline Asp on June 06, 2002.     Cornell Barman, P.A. [facility]                  Rene Paci, M.D. [facility]    LC/MEDQ  D:  07/07/2002  T:  07/07/2002  Job:  [id number]   cc:   Rosalyn Gess. Norins, M.D. [facility]   Madaline Savage, M.D.  [address]  [place]  [place] [address]  Fax: [phone]

## 2010-08-09 NOTE — Op Note (Signed)
NAME:  [name], [name]                         ACCOUNT NO.:  [id number]   MEDICAL RECORD NO.:  [id number]                   PATIENT TYPE:  INP   LOCATION:  0372                                 FACILITY:  [facility]   PHYSICIAN:  Ollen Gross. Vernell Morgans, M.D.              DATE OF BIRTH:  [birth year]   DATE OF PROCEDURE:  08/10/2002  DATE OF DISCHARGE:                                 OPERATIVE REPORT   PREOPERATIVE DIAGNOSIS:  Immune thrombocytopenic purpura.   POSTOPERATIVE DIAGNOSIS:  Immune thrombocytopenic purpura.   PROCEDURE:  Laparoscopic splenectomy.   SURGEON:  Ollen Gross. Carolynne Edouard, M.D.   ASSISTANT:  Angelia Mould. Derrell Lolling, M.D.   ANESTHESIA:  General endotracheal.   DESCRIPTION OF PROCEDURE:  After informed consent was obtained, the patient  was brought to the operating room and placed in the supine position on the  operating room table where after adequate induction of general anesthesia,  the patient's abdomen was prepped with Betadine and draped in the usual  sterile manner. Prior to prepping and draping the patient, the patient was  placed in a 45 degree lateral position with the left side up and all  pressure points were padded. At this point, the patient's abdomen was then  prepped with Betadine and draped in the usual sterile manner. The area above  the umbilicus was infiltrated 0.25% Marcaine, a small incision was made with  the 15 blade knife and this incision was carried down through the  subcutaneous tissue bluntly with the Kelly clamp and Army-Nav[employer] retractors  until the linea alba was identified. The linea alba was incised with a 15  blade knife, each side was grasped with Kocher clamps and elevated  anteriorly and the preperitoneal space was probed bluntly with a hemostat  until the peritoneum was opened and access was gained to the abdominal  cavity. A #0 Vicryl pursestring stitch was placed in the fascia surrounding  the opening, a Hasson cannula was placed through the opening  and anchored in  place with the previously placed Vicryl pursestring stitch. The abdomen was  then insufflated with carbon dioxide without difficulty. The patient was  placed somewhat in a head up position and then rotated a left farther left  side up. The epigastric area was then infiltrated with 025% Marcaine, a  small incision was made with a 15 blade knife and a 12 mm port was placed  bluntly through this incision into the abdominal cavity under direct vision.  Also a left small area about the mid clavicular line in the left subcostal  line was also infiltrated with 0.25% Marcaine, a small incision was made  with a 15 blade knife and the 12 mm port was placed bluntly through this  incision into the abdominal cavity under direct vision. Next, the area in  the left mid axillary line below the level of the ribs was also infiltrated  with 0.25% Marcaine, a  small incision was made with the 15 blade knife and a  12 mm port was placed bluntly through this incision into the abdominal  cavity under direct vision. Another area in the right upper quadrant was  infiltrated with 0.25% Marcaine, a small stab incision was made with the 15  blade knife and a 5 mm port was placed bluntly through this incision into  the abdominal cavity under direct vision. A liver retractor was placed  through this 5 mm port. The laparoscope was placed through the Hasson  cannula and a Tanja Port was placed through the left mid axillary port and  through the epigastric port for retracting. The harmonic scalpel was used  initially to take down the retroperitoneal attachments near the left colon  and lateral to the spleen. This was done very carefully and the spleen was  rotated medially. Once this was accomplished, attention was then turned to  the short gastric vessels and these were serially taken down with the  harmonic scalpel. One short gastric vessel did require a clip for adequate  control. As the dissection was  carried up to the superior most short gastric  vessels, the stomach was retracted downward and medially with the Babcock  grasper. Once this was accomplished, attention was then turned towards the  hilum of the spleen. This dissection was then performed very carefully  taking just one thin layer of tissue at a time. Gentle blunt dissection was  also carried out in this area. At this point, the large inferior pole vessel  to the spleen was identified. This was able to be surrounding with blunt  dissection once it had been skeletonized using the harmonic scalpel and an  endoscopic vascular staple was able to be placed across the inferior pole  vessels under direct vision through a window. The stapler was clamped and  fired thereby dividing the inferior pole vessels between staple lines. The  staple area was then released and removed and the staple line was  hemostatic. Gentle blunt dissection was continued up further superiorly into  the hilum of the spleen. The major vessels to the spleen in this area were  identified and skeletonized gently using the harmonic scalpel. Another  window was created around the main hilar vessels to the spleen. At this  point, the tail of the pancreas was identified and careful blunt dissection  and sharp dissection with the harmonic scalpel was used to separate the  pancreas from the hilum of the spleen without getting close to or damaging  the tail of the pancreas. A window around the hilar vessels was able  identified with blunt dissection. Another endoscopic vascular stapler was  placed across the hilar vessels, clamped and fired thereby dividing the  hilar vessels between staple lines. The stapler was released and removed and  the staple line was hemostatic. Some further superior pole vessels were also  identified, the spleen was able to be elevated and the endoscopic vascular  stapler was able to be placed across the superior pole vessels without  any difficulty and the stapling device was able to be visualized both proximally  and distally. The stapling device was then clamped and fired thereby  dividing the superior pole vessels between staple lines. The stapling device  was then released and removed and the staple lines were hemostatic. At this  point, the spleen was free from any attachments. At this point, a rip-stop  nylon bag was placed in the abdomen through one of the 12 mm  ports. The bag  was unfurled in the abdomen, the spleen was able to be manipulated into the  opening of the bag and the draw string on the bag was then tightened and  sealed. The left mid clavicular port site was then opened about a centimeter  on either side of the port using the Bovie electrocautery. The rip-stop  nylon bag was then pulled up through this port site and out of the abdomen.  The spleen was morcellated with the rounded clamps and the spleen in the bag  was then able to be removed through that port site. The fascia of the  anterior abdominal wall at the left mid clavicular port site was closed in  two layers with interrupted #0 Prolene stitches. The abdomen was then  reinsufflated and the port site repair looked good. The left upper quadrant  was then inspected and there were no bleeding points. The entire area was  hemostatic. The area was irrigated with saline and aspirated. The stomach  and pancreas and bed where the spleen had laid all looked good.   At this point, the ports were then removed under direct vision and all were  hemostatic. The gas was allowed to escape. The fascial defect at the  supraumbilical port was closed with the previously placed Vicryl pursestring  stitch. The rest of the skin incisions were closed with staples. Sterile  dressings were applied. The patient tolerated the procedure well. At the end  of the case, all sponge, needle and instrument counts were correct. The  patient was then awakened and taken to the  recovery room in stable  condition.                                               Ollen Gross. Vernell Morgans, M.D.    PST/MEDQ  D:  08/10/2002  T:  08/10/2002  Job:  [id number]

## 2010-08-09 NOTE — Cardiovascular Report (Signed)
[place]  Patient:    [name], [name] Visit Number: [id number] MRN: [id number]          Service Type: EMS Location: ED Attending Physician:  Armanda Heritage Dictated by:   Madaline Savage, M.D. Proc. Date: 04/11/01 Admit Date:  04/10/2001 Discharge Date: 04/10/2001   CC:         Cardiac Catheterization Laboratory   Cardiac Catheterization  PROCEDURES PERFORMED: 1. Emergency cardiac catheterization consisting of:    a. Selective coronary angiography by Judkins technique.    b. Retrograde left heart catheterization.    c. Left ventricular angiography. 2. Emergency coronary angioplasty of the mid circumflex coronary artery and    intracoronary artery stenting.  COMPLICATIONS: None.  ENTRY SITE: Right femoral.  DYE USED: Omnipaque.  PATIENT PROFILE: The patient is a 55 year old, African American female with a strong family history of coronary disease, who was admitted to the hospital last evening with unstable angina by Dr. Domingo Sep. This mornings cardiac enzymes were positive; both the troponin and the CK-MB were positive and the patient continued to have subscapular discomfort. ECG failed to show any ischemic change. She was brought to the catheterization lab on an emergency basis when it was realized that her enzymes were positive. The patient had dropped her blood pressure with sublingual nitroglycerin while on her telemetry unit. She came to the catheterization lab with a blood pressure slightly under 100.  The patient is a menstruating female. She just stopped her period within a days of this procedure. This procedure was performed under an emergency basis.  RESULTS:  PRESSURES: The left ventricular pressure was 94/26, the central aortic pressure was 100/70 by pullback technique. There was no aortic valve gradient noted.  ANGIOGRAPHIC RESULTS: The left main coronary artery was a large vessel, medication in length with no  lesions seen.  The LAD coursed to the cardiac apex and gave rise to a small diagonal branch arising before the first septal perforator branch. This vessel contained a 90% segmental stenosis at the ostium and in the proximal portion of the vessel. This distal vessel appears to be 1.5 mm in diameter. The LAD contains luminal irregularities but no significant lesions.  The left circumflex coronary artery is 100% occluded after an atrial branch and after the first obtuse marginal branch. There is no antegrade flow distally. The flow is therefore TIMI class 0. There is a small amount of retrograde flow into the distal OM-2 from the OM-1.  The right coronary artery is a dominant vessel of the circulation. It contains a lesion of 40% over a segmental area just around the sinus node branch. There is a second lesion of approximately 75% in the mid RCA just after an acute marginal branch. The distal RCA also contains a 40% lesion before the PDA arises.  LEFT VENTRICULAR ANGIOGRAM: The left ventricular angiogram in an RAO projection showed inferobasal hypokinesis of mild degree. Ejection fraction was felt to be 55%. Anterolateral apical and anterobasal wall segments all move normally.  The LAO left ventriculogram showed posterolateral and septal wall motion to be normal.  INTERVENTIONAL PROCEDURE: This was performed through a 7 French sheath, which was indwelling from the cardiac catheterization. ACT was documented at being over 300.  The patient had previously been on Integrilin. The guide catheter used was a 7 [place] Voda 3.5 guide catheter without side holes which was an excellent choice. The guide wire used was a short, Hi-Torque Floppy J wire. I put both  a primary and a secondary bend on the wire to negotiate down the circumflex which arose from the left main coronary artery in almost a right angle fashion. I had some difficulty crossing the lesion with this guide wire, but I  successfully crossed and brought the wire to rest in the midportion of the OM-2. The pre-dilatation balloon was a 3.0 x 20 mm Maverick balloon inflated to 6 atmospheres of pressure corresponding to a transluminal diameter of 3.0. TIMI-2 to 3 class flow was established, but the vessel was obviously dissected in the midportion of the worse portion of the lesion. A 3.0 x 20 mm Express stent then crossed the lesion with ease. The inflation pressure was 14 atmospheres corresponding to an estimated diameter of 3.15. The vessel was smooth and prompt distal flow was noted. There was noted to be a lesion at the bifurcation of OM-2 and a tiny side branch, probably atrial branch. The 3.0 balloon was inflated to 4 atmospheres for 60 seconds and the lesion in the distal circumflex, proximal OM was reduced to 0 and there was coronary spasm in the small side branch off the distal circumflex. There was also noted to be some spasm in OM-1. I chose to treat this with intravenous nitroglycerin and not intervene on either of those two sites. The patient tolerated the procedure very well.  She had mild increased angina during balloon inflations. The only hemodynamic complication that occurred, was relative hypotension during stenting and during one to two minutes of coronary occlusion. This was treated with intravenous fluid and dopamine up to 10 cc/h. On exit from the catheterization lab, the patients blood pressure was 140/76 and the dopamine was turned back from 10 to 3 drops a minute.  FINAL DIAGNOSES: 1. Acute inferolateral myocardial infarction on anatomic grounds with    unstable angina. 2. Three-vessel coronary artery disease:    a. A 100% occluded mid circumflex.    b. A 90% stenosis of the proximal diagonal branch of the left anterior       descending.    c. Multiple lesions in the right coronary artery, the worst of which was a       75% mid stenosis. 3. Left ventricular ejection fraction  55%.   PLAN: Continued medical therapy. The patient will need possible intervention on her right coronary artery if she proves to have coronary ischemia in the future. Every effort should be made at risk factor modification in this young menstruating female. Dictated by:   Madaline Savage, M.D. Attending Physician:  Armanda Heritage DD:  04/11/01 TD:  04/12/01 Job: [id number]

## 2010-08-09 NOTE — Discharge Summary (Signed)
NAMEGLORA, [name]                         ACCOUNT NO.:  [id number]   MEDICAL RECORD NO.:  [id number]                   PATIENT TYPE:  INP   LOCATION:  6524                                 FACILITY:  [facility]   PHYSICIAN:  Madaline Savage, M.D.             DATE OF BIRTH:  06-13-1955   DATE OF ADMISSION:  12/27/2001  DATE OF DISCHARGE:  12/29/2001                                 DISCHARGE SUMMARY   DISCHARGE DIAGNOSES:  1. Unstable angina, new diagonal lesion at catheterization this admission.  2. Thrombocytopenia, platelet count 85,000 at discharge.  3. Known coronary disease with previous right coronary artery and circumflex     stenting in January of 2003.  4. Lupus.  5. Neutropenia.   HISTORY OF PRESENT ILLNESS:  The patient is a 55 year old female followed by  Madaline Savage, M.D., and Lemmie Evens, M.D., with a history of  coronary disease.  She had an MI in January of 2003 and underwent circumflex  stenting and a staged RCA intervention.  She presented on December 27, 2001,  with chest pain consistent with unstable angina.   ALLERGIES:  She has no known drug allergies.   LABORATORY DATA:  The white count is 2.8, hemoglobin 12.5, hematocrit 37.1.  The platelet count on admission was 99,000, dropped to 85,000 with heparin,  dropped to 80,000 after her catheterization, and is 85,000 at discharge.  CK-  MB and troponins are negative.  The INR is 1.0.  Sodium 141, potassium 4.1,  BUN 14, creatinine 1.0.  The differential shows a slight ________ monocytes  at 15.  The EKG shows sinus rhythm, sinus bradycardia, and nonspecific  changes.   HOSPITAL COURSE:  She was admitted to telemetry and ruled out for an MI.  Her platelet count on admission was 99,000.  Catheterization was done on  December 28, 2001, by Cristy Hilts. Jacinto Halim, M.D., which revealed a patent RCA stent  site, patent circumflex stent site, and a new 99% diagonal narrowing.  Her  EF was 55%.  Renal arteries and  iliacs were patent.  Her platelet count  dropped to 80,000 and it was elected to hold off on percutaneous  intervention.  She will be discharged home and have a platelet count checked  next week.  If her platelet count is over 100,000, she will proceed with  outpatient PCI which is scheduled per Madaline Savage, M.D., on January 07, 2002, at 9 a.m.   DISPOSITION:  The patient is discharged in stable condition.   FOLLOW-UP:  She will have a platelet count next week and possibly a PCI next  Friday depending on this result.   DISCHARGE MEDICATIONS:  1. Coated aspirin once a day.  2. Toprol XL 25 mg a day.  3.     Zocor 10 mg a day.  4. Protonix 40 mg a day.  5. Nitroglycerin sublingual p.r.n.  Abelino Derrick, P.A.                      Madaline Savage, M.D.    Lenard Lance  D:  12/29/2001  T:  01/01/2002  Job:  [id number]

## 2010-08-09 NOTE — Discharge Summary (Signed)
   NAMENYONNA, [name]                         ACCOUNT NO.:  [id number]   MEDICAL RECORD NO.:  [id number]                   PATIENT TYPE:  INP   LOCATION:  0377                                 FACILITY:  [facility]   PHYSICIAN:  Rosalyn Gess. Norins, M.D. [facility]         DATE OF BIRTH:  June 12, 1955   DATE OF ADMISSION:  04/19/2002  DATE OF DISCHARGE:  04/20/2002                                 DISCHARGE SUMMARY   24-HOUR EVALUATION:   ADMISSION DIAGNOSES:  1. Nausea and vomiting with dehydration.  2. Urinary tract infection.   DISCHARGE DIAGNOSES:  1. Nausea and vomiting with dehydration.  2. Urinary tract infection.   HISTORY OF PRESENT ILLNESS:  Please see the H&P dictated April 19, 2002,  for the details of the history of present illness, past medical history,  family history, and physical exam.   HOSPITAL COURSE:  The patient was admitted with dehydration.  She was  rehydrated with D-5 normal saline x 2 L.   Laboratory evaluation revealed the patient to have positive UA with 0-2  wbc's and a few bacteria.  Dipstick was cloudy urine with 2+ blood.  ESR was  mildly elevated at 33.  Amylase was 101, lipase 15.  LFTs were normal.  Sodium 137, potassium 3.6, chloride 104, CO2 26, BUN 21, creatinine 1.4,  glucose 104.  Hemoglobin 13.1, hematocrit 38.1, white count 5200 with 79%  segs, 15% lymphs, 6% monos.   The patient did well overnight.  On the morning of discharge she reports she  is feeling much better.  Nausea was resolved.  She had a reasonable  appetite.   PHYSICAL EXAMINATION:  VITAL SIGNS:  The patient was afebrile.  Her blood  pressure revealed 108/67 with a pulse of 79 supine, 103/60 with a heart rate  of 91 sitting, 101/74 with a heart rate of 97 standing.  GENERAL:  Heavy-set woman lying in bed, looking much better than yesterday,  in no distress.  ABDOMEN:  Nontender.   DISPOSITION:  The patient is to be discharged home.   MEDICATIONS:  1. She will continue on her  prior home regimen of medications.  2. She is provided a prescription for:     A. Phenergan 12.5 mg p.o. q.6h. for nausea.     B. Ceftin 125 mg p.o. b.i.d. for five days for UTI.    DISCHARGE INSTRUCTIONS:  The patient is encouraged to drink fluids.   FOLLOW-UP:  She is to see Dr. Debby Bud in the office for follow-up in 7-10  days.                                               Rosalyn Gess Norins, M.D. [facility]    MEN/MEDQ  D:  04/20/2002  T:  04/20/2002  Job:  [id number]

## 2010-08-09 NOTE — Discharge Summary (Signed)
[name], [name]                         ACCOUNT NO.:  [id number]   MEDICAL RECORD NO.:  [id number]                   PATIENT TYPE:  OIB   LOCATION:  6531                                 FACILITY:  [facility]   PHYSICIAN:  Madaline Savage, M.D.             DATE OF BIRTH:  23-Jan-1956   DATE OF ADMISSION:  01/07/2002  DATE OF DISCHARGE:  01/08/2002                                 DISCHARGE SUMMARY   ADMISSION DIAGNOSES:  1. Unstable angina.  2. Status post diagnostic cardiac catheterization on December 28, 2001 which     revealed a new 99% diagonal narrowing. However, at that time her platelet     count dropped to 80,000 and it was elected to hold off on percutaneous     intervention. She was to be discharged home for follow-up platelet count     and is stable. To be re-admitted for intervention.  3. Recent thrombocytopenia with platelet count of 85,000 on December 29, 2001.  4. Known past coronary artery disease with history of RCA with circumflex     stenting in January of 2003.  5. Lupus.  6. Neutropenia.  7. Hyperlipidemia.   DISCHARGE DIAGNOSES:  1. Unstable angina.  2. Status post diagnostic cardiac catheterization on December 28, 2001 which     revealed a new 99% diagonal narrowing. However, at that time her platelet     count dropped to 80,000 and it was elected to hold off on percutaneous     intervention. She was to be discharged home for follow-up platelet count     and is stable. To be re-admitted for intervention.  3. Recent thrombocytopenia with platelet count of 85,000 on December 29, 2001.  4. Known past coronary artery disease with history of RCA with circumflex     stenting in January of 2003.  5. Lupus.  6. Neutropenia.  7. Hyperlipidemia.  8. Status post cardiac catheterization on January 07, 2002 by Dr. Chanda Busing with PTCA to the osteal/proximal diagonal. This went from 90%     stenosis to 30% residual.   HISTORY OF PRESENT ILLNESS:  [name] is a  55 year old female with a  history of coronary artery disease. She had an myocardial infarction in  January of 2003 and underwent circumflex stenting and a staged RCA  intervention. She presented on December 27, 2001 with chest pain consistent  with unstable angina. She subsequently underwent cardiac catheterization on  December 28, 2001 which revealed a patent RCA Stent site, patent circumflex  Stent site and a new 99% diagonal narrowing with ejection fraction of 55%.  However, her platelet count dropped to 80,000 and it was elected to hold off  on percutaneous intervention. She was discharged home and was to have follow-  up platelet count in one week and if the platelet counts were stable, then  she would be perceived without patient PCI by  Dr. Elsie Lincoln. Follow-up platelet  count on January 05, 2002 was 84,000. This was reviewed by Dr. Elsie Lincoln and it  was felt that she was stable to undergo percutaneous intervention by him on  January 07, 2002.   HOSPITAL COURSE:  On January 07, 2002 the patient underwent cardiac  catheterization by Dr. Chanda Busing. Please see dictated report for  details. He performed percutaneous coronary angioplasty to the  osteal/proximal diagonal. This was a 90% lesion and went to 30% residual  with a distal dissection which was too small for stenting. Angio max was  used instead of Heparin due to low platelet count of 80,000. Post procedure,  he continued IV Nitroglycerin for anginal pain due to dissection. She  tolerated the procedure well and had no complications and he planned to  follow-up with platelet count. On the morning of January 08, 2002 Ms. Marczak  is stable. Blood pressure is 90/50. Heart rate 80. Groin site is stable. No  hematoma or bleed. She is maintaining sinus rhythm. Platelet count is 75,000  which is felt to be stable and reviewed by Dr. Allyson Sabal. At that time, she was  seen and evaluated by Dr. Nanetta Batty, who deemed her stable for   discharge.   CONSULTATIONS:  None.   PROCEDURE:  1. Cardiac catheterization on January 07, 2002 by Dr. Chanda Busing. A 90%     stenosis in the osteal diagonal was reduced to 30% with balloon     angioplasty which was complicated by dissection of the vessel. This was     distal to the intervention site and was too small for stenting. She     remained stable and left the lab in stable condition without further     complication.   LABORATORY DATA:  On January 07, 2002 white count 20, hemoglobin and  hematocrit 12.4 and 36.5, platelet 80,000. On January 08, 2002 white count  2.6, hemoglobin and hematocrit 11.8 and 35.0. Platelet count 77,000. On  admission PT 13.3, INR 1.0. Sodium 138, potassium 3.9, chloride 105, CO2 26,  glucose 87, BUN 10, creatinine 1.0 and these remained stable. CK 76, MB 2.6  which is negative.   DIAGNOSTIC STUDIES:  Telemetry showed normal sinus rhythm. No arrhythmia.   DISCHARGE ACTIVITIES:  1. Enteric coated aspirin 325 mg one daily.  2. Atenolol 25 mg once daily.  3. Zocor 10 mg which is half of the 20 mg tab.  4. Wellbutrin SR 150 mg once daily.  5. Protonix 40 mg once daily.  6. Altace 1.25 mg once daily.  7. Nitroglycerin 0.4 mg SL as directed.   ACTIVITY:  No strenuous activity, heavy lifting greater than 5 pounds,  driving, or sexual activity for three days.   DIET:  Low salt, low fat, low cholesterol diet.   WOUND CARE:  Wash groin site with warm water and soap.   FOLLOW UP:  Call [phone] for any bleeding or increased size or pain at  groin site. On Monday call [phone] and make an appointment to see Dr.  Elsie Lincoln back in two weeks.     [name], P.A.-C.                   Madaline Savage, M.D.   MBE/MEDQ  D:  01/13/2002  T:  01/14/2002  Job:  [id number]   cc:   Madaline Savage, M.D.  [address]  [place]  [place] [address]  Fax: [phone]

## 2010-08-09 NOTE — H&P (Signed)
[name], [name]                         ACCOUNT NO.:  [id number]   MEDICAL RECORD NO.:  [id number]                   PATIENT TYPE:  INP   LOCATION:  0377                                 FACILITY:  [facility]   PHYSICIAN:  Rosalyn Gess. Norins, M.D. [facility]         DATE OF BIRTH:  10-19-1955   DATE OF ADMISSION:  04/19/2002  DATE OF DISCHARGE:                                HISTORY & PHYSICAL   CHIEF COMPLAINT:  Nausea and vomiting with diarrhea.   HISTORY OF PRESENT ILLNESS:  The patient is a 55 year old African-American  woman who presented to see Dr. Debby Bud as a new patient in the office on the  day of admission.  She reported that for the previous 24 hours she had been  having abdominal pain and discomfort along with nausea and vomiting as well  as diarrhea.  She denied any hematemesis.  She has had no hematochezia.  She  reports no fever.  She has had increased weakness and somnolence to the  extreme.  She said she has not been able to keep down any food and barely  can keep down juices and fluids.  In the office she was pale in appearance,  appeared very weak, and was significantly hypotensive.  She is subsequently  admitted to the hospital for IV hydration, control of her nausea and  vomiting, and further evaluation.   PAST SURGICAL HISTORY:  The patient had been attacked by a dog and sustained  multiple bite lacerations requiring plastic surgical repairs and drainage.  She has had no other surgery.   OBSTETRICAL HISTORY:  She has had two pregnancies, two deliveries, both  normal spontaneous vaginal deliveries.   PAST MEDICAL HISTORY:  The patient has had the usual childhood diseases.  She was diagnosed with lupus erythematosus many years ago.  She was recently  admitted January 2003, with an MI.  By her report she underwent cardiac  catheterization followed by PCI and placement of two stents by Dr. Lavonne Chick.  The patient was readmitted to the hospital October 2003, with  recurrent unstable angina and underwent repeat cardiac catheterization and  PCI.  The patient was hospitalized in December for chest pain and discomfort  and was at that time diagnosed with idiopathic thrombocytopenic purpura.   PHYSICIAN ROSTER:  1. Dr. Lavonne Chick for cardiology.  2. Dr. Corliss Skains for rheumatology.  3. Dr. Arline Asp for hematology/oncology.   CURRENT MEDICATIONS:  1. Atenolol 25 mg daily.  2. Zocor 10 mg daily.  3. Wellbutrin SR 150 mg daily.  4. Altace 1.25 mg daily.  5. Protonix 40 mg daily.  6. Prednisone 20 mg three times daily.  7. B12 oral tablets daily.   HABITS:  Tobacco:  The patient was smoking one pack per day but now is down  to one cigarette a day.  She uses no alcohol.  She has no recreational drug  use.   ALLERGIES:  No  known drug allergies.   FAMILY HISTORY:  Positive for coronary artery disease.  Positive for  diabetes.  Positive for hypertension.  Positive for hyperlipidemia.  Positive for breast cancer in her mother.  Positive for cervical cancer in  her mother.  Positive for colon cancer in her paternal grandfather.   SOCIAL HISTORY:  Not obtained.   REVIEW OF SYSTEMS:  The patient had constitutional symptoms per the HPI.  She also reports she has had blurred and double vision.  She has not seen an  eye physician for greater than 12 months.  She denies any ENT complaints.  She has no chest pain, discomfort, or anginal symptoms.  She does have mild  shortness of breath, but no cough.  No orthopnea.  GASTROINTESTINAL:  As per  the HPI.  GENITOURINARY:  No complaints.  She does see a gynecologist on a  regular basis, with her next visit being due in February for routine pelvic,  Pap, and breast exam.   PHYSICAL EXAMINATION:  VITAL SIGNS:  Temperature 98.4, blood pressure 78/50  and barely audible, heart rate was 128, respirations were 16.  Weight 206,  height 5 feet 6 inches.  GENERAL:  Obese black female who is very weak, unstable on her  feet.  HEENT:  Normocephalic, atraumatic.  EACs unremarkable.  She did have  fullness of the right TM with some mild erythema.  Posterior pharynx without  erythema or exudate.  Conjunctivae and sclerae were clear, without icterus.  NECK:  Supple.  No thyromegaly.  Nodes:  No adenopathy was noted in the  cervical or supraclavicular regions.  CHEST:  No CVA tenderness.  LUNGS:  Clear to auscultation and percussion.  With no rales, wheezes, or  rhonchi.  CARDIOVASCULAR:  The patient's pulse was rapid and thready.  Her precordium  was quiet.  Her heart sounds were very distant but regular, with no murmurs  appreciated.  BREASTS:  Deferred to gynecology.  ABDOMEN:  Protuberant, soft.  She had hypoactive bowel sounds in all four  quadrants.  The patient had significant tenderness in the epigastrium,  reporting pain that radiated through to her back.  She did have some  guarding in this region.  The rest of her abdominal exam was unremarkable.  PELVIC:  Deferred.  RECTAL:  Deferred.  EXTREMITIES:  Without clubbing, cyanosis, or edema.  No deformities were  noted.  NEUROLOGIC:  The patient is awake and alert.  She is oriented to person,  place, time, and context.  She is a good historian.  Cranial nerves II-XII  were intact with normal facial asymmetry, normal extraocular muscle  movement.  Motor strength:  The patient is generally weak but had no focal  weakness.  Cerebellar function:  The patient was noted to have a somewhat  ataxic gait which is most likely secondary to weakness.  DERMATOLOGIC:  The patient had an erythematous rash across the malar  surface.  She has what appears to be a possible fever blister on her lip.  No other skin lesions were noted.   ASSESSMENT AND PLAN:  1. Dehydration:  Patient with a history of nausea and vomiting for at least     24 hours, now tachycardic with hypotension.  Orthostatics were not    obtained in the office.  Plan:  Regular hospital admission.   Will give     the patient D-5 normal saline at 200 mL/hr for 2 L, then half normal     saline at 100 mL/hr.  The patient is to have orthostatic vital signs on     admission and q.a.m.  Will treat the nausea with Phenergan 12.5 mg IV     q.6h. p.r.n.  2. Gastrointestinal:  Patient with nausea and vomiting, as noted.  She had a     very tender epigastrium.  Would be concerned for pancreatitis given her     examination.  There was no palpable spleen tip, no palpable liver edge.     Plan:  The patient will have amylase, lipase, and liver function studies.     Will continue her proton pump inhibitor.  The patient may need to have     imaging study, specifically, CT with pancreatic protocol if her     laboratories are abnormal or her pain does not resolve.  3. Infectious disease:  The patient is afebrile; however, she does have an     erythematous TM.  I would consider an immunocompromised patient.  Plan:     The patient is to be given Rocephin at 1 g IV.  Will obtain CBC with     differential.  4. Heme:  Patient with the diagnosis of idiopathic thrombocytopenic purpura.     She has had a very low platelet count.  Plan:  The patient is to have     CBC, as noted.  Will continue her prednisone therapy.  Will notify Dr.     Arline Asp of her admission.  5. Rheumatology:  Patient with a history of systemic lupus erythematosus,     followed by Dr. Corliss Skains.  She is on steroids.  She is at risk for all     the complications of lupus.  Plan:  General laboratories, as noted.  Am     particularly interested in monitoring renal function.                                               Rosalyn Gess Norins, M.D. [facility]    MEN/MEDQ  D:  04/19/2002  T:  04/19/2002  Job:  [id number]

## 2010-08-09 NOTE — Cardiovascular Report (Signed)
   NAME:  [name], [name]                         ACCOUNT NO.:  [id number]   MEDICAL RECORD NO.:  [id number]                   PATIENT TYPE:  AMB   LOCATION:  ENDO                                 FACILITY:   PHYSICIAN:  Darlin Priestly, M.D.             DATE OF BIRTH:  12-25-1955   DATE OF PROCEDURE:  08/09/2002  DATE OF DISCHARGE:                              CARDIAC CATHETERIZATION   PROCEDURE:  Transesophageal echocardiogram.   ATTENDING PHYSICIAN:  Darlin Priestly, M.D.   COMPLICATIONS:  None.   INDICATIONS FOR PROCEDURE:  Ms.  [name] is a 55 year old female patient  of  Dr. Lavonne Chick with history of noncritical coronary artery disease, history  of systemic lupus on chronic steroids who underwent recent catheterization  with left ventriculogram revealing moderate-to-severe mitral regurgitation.  She is now referred for transesophageal echocardiogram to evaluate her  mitral valve.   DESCRIPTION:  After getting informed, written consent the patient was  brought to the endoscopy suite in a fasting state.  The patient then  received 75 mg of Demerol and 2 mg of Versed.  After adequate anesthesia,  the patient underwent successful uncomplicated transesophageal  echocardiogram.   This revealed normal LV size and systolic function, estimated EF  approximately 60%.  There was no segmental wall motion abnormalities  visualized.   The aortic valve appeared to be structurally normal with trivial aortic  regurgitation.   The mitral valve appeared to be structurally normal with moderate mitral  regurgitation.  There did appear to be an eccentric posteriorly directed  mitral jet without significant reversal of flow in the pulmonary vein.   The tricuspid valve appeared to be structurally normal with mild tricuspid  regurgitation.   There is no evidence of intracardiac mass or thrombus present.   There is no patent foramen ovale by contrast and color echo.   There is normal  descending thoracic aorta.   CONCLUSION:  Successful transesophageal echocardiogram with findings as  noted above.                                               Darlin Priestly, M.D.    RHM/MEDQ  D:  08/09/2002  T:  08/09/2002  Job:  [id number]   cc:   Madaline Savage, M.D.  [address], [address]  [place]  [place] [address]  Fax: [phone]

## 2010-08-09 NOTE — Discharge Summary (Signed)
[name]               ACCOUNT NO.:  [id number]   MEDICAL RECORD NO.:  [id number]          PATIENT TYPE:  INP   LOCATION:  2024                         FACILITY:  [facility]   PHYSICIAN:  Bruce H. Swords, M.D. [facility] OF BIRTH:  1955/11/11   DATE OF ADMISSION:  05/28/2005  DATE OF DISCHARGE:  05/30/2005                                 DISCHARGE SUMMARY   DISCHARGE DIAGNOSES:  1.  Acute pericarditis likely secondary to lupus.  2.  Mitral valve prolapse, newly diagnosed.   HISTORY OF PRESENT ILLNESS:  The patient is a 55 year old female patient of  Dr. Illene Regulus who presented on May 28, 2005, with a complaint of three-  day history of chest pain which felt like her previous pericarditis. She  states that this chest pain has been constant and can be quite severe. She  has had some nausea and shortness of breath with exertion. She was admitted  for further evaluation   PAST MEDICAL HISTORY:  1.  History of coronary artery disease, status post stent times two.  2.  History of lupus with arthralgias.  3.  History of ITP, status post splenectomy.  4.  History of moderate to severe mitral regurgitation.   COURSE OF HOSPITALIZATION:  Problem #1:  Acute pericarditis. The patient was  evaluated by [facility] and she was placed on IV steroids.  Serial cardiac enzymes were performed and were negative times three. The  patient had previously been followed by Dr. Corliss Skains of rheumatology. Her  office was contacted for inpatient consult and it was recommended that the  patient be placed on outpatient steroids with outpatient follow-up. However  their office later contacted [name] after reviewing her chart and stated that  the patient had been discharged from their practice and that they would not  be following up with her as an outpatient.  I confirmed this on May 30, 2005, with Dr. Fatima Sanger nurse and she states that the patient had been  sent a letter from their  practice on February 09, 1993, stating that she had  been discharged from their practice and was given names of other  rheumatologists in the area for follow-up. The patient also refuses to  follow up with Dr. Aundra Dubin and wishes follow  up with Dr.  Coral Spikes. I contacted Dr. Katina Degree office and they state that he is  accepting new patients, however his policy is that he will review the  patient's medical history and at that point will decide whether or not he  wishes to take him on as a new patient. Hospital H&P and the patient's cover  sheet were faxed to Dr. Coral Spikes. His office is to contact Dr. Debby Bud in  regards to possible outpatient follow-up.   The patient did respond quite well to IV steroids. At this time plan to  transition to p.o. prednisone in the morning and discharge to home in the  morning if the patient continues to feel well with a prednisone taper.   Problem #2:  Mitral valve prolapse. It was noted on echo that the patient  had  mitral valve prolapse which was reportedly new as compared to June 03, 2002, echo. The patient was instructed on the importance of SBE prophylaxis  prior to any dental procedures.   MEDICATIONS AT DISCHARGE:  1.  Altace 1.25 mg p.o. daily.  2.  Atenolol 25 mg p.o. daily.  3.  Protonix 40 mg p.o. daily.  4.  Pravachol 40 mg p.o. daily.  5.  Levothyroxine 50 mcg p.o. daily.  6.  Prednisone 60 mg on March 10th and March 11th, then 50  mg on March 12th      and March 13th, then 40 mg on March 14th and March 15th, then 30 mg on      March 16th and March 17th, then 20 mg on March 18th and 19th, then 10 mg      on March 20th and March 21st, then stop.   PERTINENT LABORATORIES ON DISCHARGE:  Serial cardiac enzymes negative times  two.  Hemoglobin 11.8, platelet count 272,000, BUN 9, creatinine 1.2. CRP  21.5, ESR 66.   FOLLOWUP:  The patient is scheduled for follow-up with Dr. Dr. Illene Regulus on March 15th at 2:15 p.m. In addition  Dr. Coral Spikes will review her  records and will contact Dr. Debby Bud in regards to potential rheumatology  consultation as an outpatient. The patient is instructed to return to the  emergency room should she develop shortness of breath or severe chest pain  and to contact Dr. Debby Bud should she develop a fever over 101.      [name], NP      Valetta Mole. Swords, M.D. [facility]  Electronically Signed    MSO/MEDQ  D:  05/30/2005  T:  06/01/2005  Job:  [id number]

## 2010-08-09 NOTE — H&P (Signed)
NAME:  [name], [name]                         ACCOUNT NO.:  [id number]   MEDICAL RECORD NO.:  [id number]                   PATIENT TYPE:  INP   LOCATION:  1824                                 FACILITY:  [facility]   PHYSICIAN:  Rene Paci, M.D. [facility]          DATE OF BIRTH:  08-14-1955   DATE OF ADMISSION:  06/13/2002  DATE OF DISCHARGE:                                HISTORY & PHYSICAL   PRIMARY CARE PHYSICIAN:  Rosalyn Gess. Norins, M.D.   PRIMARY CARDIOLOGIST:  Madaline Savage, M.D.   PRIMARY ONCOLOGIST/HEMATOLOGIST:  Samul Dada, M.D.   PRIMARY RHEUMATOLOGIST:  Pollyann Savoy, M.D.   CHIEF COMPLAINT:  Temperature at home of 105.6 early this morning.   HISTORY OF PRESENT ILLNESS:  The patient began feeling poorly on Friday,  June 10, 2002, when she had a headache, nausea, without emesis.  Her last  full meal was on June 08, 2002, and since then she had become anorexic.  She was sustaining herself on fluids.  She sought no medical attention until  early this morning.  Now she has a fever, chills, and a progressive  anorexia.  She has chest pain which occurs with cough.  She was recently  discharged from [facility]. [facility] February 2004.   ALLERGIES:  No known drug allergies.   MEDICATIONS:  1. Altace 1.25 mg daily.  2. Atenolol 25 mg daily.  3. Zocor 20 mg daily.  4. Wellbutrin SR 150 mg daily.  5. Nitroglycerin sublingual 0.4 mg p.r.n.  6. Protonix 40 mg daily.  7. Prednisone 60 mg daily.  8. Promethazine HCl dose unknown for nausea.  9. Her aspirin was discontinued during a previous hospital administration.  10.      She was also taking Vicodin at home, dose unknown.   REVIEW OF SYSTEMS:  This is essentially as per her past medical history and  recent previous H&P.   PAST MEDICAL HISTORY:  This includes systemic lupus erythematosus, severe  thrombocytopenia secondary to ITP, neutropenia, PVD, DVT on June 07, 2002,  agranulocytosis, CAD, MI  January 2003, status post right and left heart  catheterization with RCA intervention April 14, 2001, status post left  circumflex and RCA stents April 11, 2001.  Anemia with positive Coombs'  test, lupus anticoagulant with prolonged PTT.  Hyperlipidemia.  Possible  seizure disorder.  She has history of plastic surgery to repair injury from  a dog bite.   SOCIAL HISTORY:  She has no tobacco, alcohol or drug history.  She has two  children and lives by herself.   FAMILY HISTORY:  This is positive for cardiac disease.   LABORATORY DATA:  Blood cultures are pending.  Urine micro with rare  bacteria.  PT and INR 13.9 and 1.0.  BMET showed she was hyponatremic at  128, albumin of 1.7, calcium 8.0.  White blood cell count 5.7, H&H 11.1 and  32.7, platelet count  was 71.  Review of the past several labs shows that  this is down from the 90s.  Absolute lymphs were at 0.3.   Her chest x-ray demonstrated right upper lobe infiltrate with interstitial  process in the right lower lobe.   PHYSICAL EXAMINATION:  GENERAL APPEARANCE:  She appears anxious at first  again with slower breathing, deeper breathing, and she seemed reassured.  She is awake, alert and oriented x3.  She is tired.  VITAL SIGNS:  Temperature 103.3.  This is after Tylenol administration.  She  is 94% on room air.  Blood pressure 112/66, heart rate ranges from 120 to  125.  Respiratory rate 22 to 30.  While she was in the upper ranges of her  respiratory rate, I asked her to breath slower and concentrate on the  breathing and she was able to lower it into the low 20s.  HEENT:  She has dry mucosa.  She has a whitish greenish substance on her  tongue. She has a rotund face.  Her EOMs are intact bilaterally.  PERRLA.  NECK:  No thyromegaly palpable.  She does have a thick neck and palpation  triggered additional coughing.  LUNGS:  She had decreased air exchange.  No wheeze auscultated.  CARDIOVASCULAR:  Tachycardic in the 110 to  120 range from me during the  examination.  ABDOMEN:  Obese, soft, nontender with good bowel sounds.  EXTREMITIES:  She has good pedal pulses bilaterally and her dorsalis.  She  has no edema.  Her skin is warm.  RECTAL/GU:  Deferred.  NEUROLOGIC:  Cranial nerves II-XII grossly intact.   ASSESSMENT AND PLAN:  1. Pneumonia.  She has received a first dose of Rocephin.  Will continue her     on Rocephin at this point.  Provide inhalers, incentive spirometer O2,     cough suppression, Tylenol for her temperatures.  2. Reduced platelet count.  Dr. Arline Asp has been notified.  3. Cardiovascular status.  Dr. Elsie Lincoln has been notified of her admission.  4. Low albumin.  Nutritional consult has been put in place.  5. History of questionable seizure.  We will start seizure precaution with     padding, etc., in her room and q.4h. vital signs and check.  6. History of mildly elevated glucose in the past.  Given her steroid     history, will check CBGs b.i.d. and probably discontinue them in a day or     two if she maintains normal glucose level.  7. Thrush.  Will start her on Majik mouthwash t.i.d.  8. Nausea.  We will start her on IV Phenergan.  9. Hyponatremia.  We will monitor her fluids and BMET.     Governor Specking, N.P. [facility]             Rene Paci, M.D. [facility]    TJ/MEDQ  D:  06/13/2002  T:  06/13/2002  Job:  [id number]   cc:   Rosalyn Gess. Norins, M.D. [facility]   Madaline Savage, M.D.  [address], [address]  [place]  [place] [address]  Fax: [phone]   Samul Dada, M.D.  [address]  [place]  [place] [address]  Fax: [phone]   Pollyann Savoy, M.D.  [address]  [place], [place] [address]  Fax: [phone]

## 2010-08-09 NOTE — Discharge Summary (Signed)
NAME:  Meghan, Adams                         ACCOUNT NO.:  [id number]   MEDICAL RECORD NO.:  [id number]                   PATIENT TYPE:  INP   LOCATION:                                       FACILITY:  [facility]   PHYSICIAN:  Rosalyn Gess. Norins, M.D. [facility]         DATE OF BIRTH:  September 28, 1955   DATE OF ADMISSION:  06/13/2002  DATE OF DISCHARGE:  06/21/2002                                 DISCHARGE SUMMARY   ADMISSION DIAGNOSES:  1. Right upper lobe cavitary pneumonia.  2. Tachycardia.  3. Idiopathic thrombocytopenia purpura.   DISCHARGE DIAGNOSES:  1. Right upper lobe cavitary pneumonia.  2. Tachycardia.  3. Idiopathic thrombocytopenia purpura.   CONSULTATIONS:  Dr. Elsie Lincoln for [facility] for  cardiology.  Dr. Johney Frame for oncology/hematology.  Dr. Jayme Cloud and Dr. Sung Amabile for pulmonology.   PROCEDURE:  Chest x-ray March 22 with right upper lobe infiltrate. Chest x-  ray March 26 with right upper lobe consolidation with cavitation. CT scan of  the chest March 27 revealing a 7-cm right upper lobe cavitary lesion, patchy  infiltrates, left lower lobe atelectasis, no PE. Bronchoscopy June 20, 2002  revealed right upper lobe cavitary necrotizing lesion. Pathology and  cytology was negative. Bone marrow aspiration and biopsy performed June 21, 2002 with a normal karyotype 94, XX and normal cytogenic studies.   HISTORY OF PRESENT ILLNESS:  [name] is a very pleasant 55 year old  African-American female with multiple medical problems who presented to the  emergency department secondary to fever of 105.6. She had been feeling ill  for several days with headache and nausea. She also developed fevers and  progressive loss of appetite. She had chest pain with cough. At the time of  admission, chest x-ray did reveal right upper lobe infiltrate. The patient  had normal white count but significant left shift, and she was subsequently  admitted to the hospital.   PAST MEDICAL HISTORY:  1. Significant for systemic lupus erythematosus.  2. Severe thrombocytopenia secondary to ITP.  3. Peripheral vascular disease.  4. History of DVT.  5. History of CAD with MI in January of 2003, the patient being status post     PCI April 14, 2001 with stenting of the left circumflex and RCA.  6. History of hyperlipidemia.  7. History of plastic surgical repair of a dog bite.   MEDICATIONS ON ADMISSION:  1. Altace 0.25 mg daily.  2. Atenolol 25 mg daily.  3. Zocor 20 mg daily.  4. Wellbutrin SR 150 mg daily.  5. Sublingual nitroglycerin p.r.n.  6. Protonix 40 mg daily.  7. Prednisone 60 mg daily.  8. Promethazine for nausea as needed.  9. Vicodin as needed.   HOSPITAL COURSE:  1. Infectious disease/pulmonary. The patient was admitted with a community-     acquired right upper lobe pneumonia. During her hospital course, she     developed cavitary lesion to a  maximum of 7 cm as noted. The patient did     receive seven days of Rocephin and five days of Zithromax. Because of her     persistent cavitary lesion, she was then started on Avelox. The patient     was seen in consultation by the pulmonary service. Sputum was tested and     was negative with final cultures being negative for AFB pneumocystis     carinii, yeast. The patient did have a positive Legionella antigen by     urine, but her direct antigen was negative, and culture was negative for     Legionella.  Patient did come to bronchoscopy which revealed a cavitary     necrotizing lesion but no definitive diagnosis could be made. With the     patient stabilizing, with her being in no respiratory distress, with     diagnostic evaluation completed, she was felt to be stable and ready for     discharge home on June 21, 2002 to continue a course of Avelox.  2. Cardiovascular. The patient with a history of CAD as noted. She presented     with tachycardia at admission, most likely secondary to problem #1.  She     was seen in consultation by Dr. Chrissie Noa Gamble/[facility]. She had been scheduled for transesophageal echocardiogram, but     this was not done during this admission and was to be done as an     outpatient. The patient had no other cardiac problems during this     hospital stay.  3. Heme. The patient with known ITP with thrombocytopenia and neutropenia.     The patient has been followed as an outpatient by Dr. Arline Asp. She was a     candidate for splenectomy. During this hospitalization, she had a bone     marrow aspiration and biopsy. Only results on the chart at the time of     dictation included karyotyping and cytogenic studies which were negative.     The patient was to be followed up as an outpatient with Dr. Arline Asp. The     patient was to eventually be scheduled for splenectomy when stable.   The patient was thought to be stable and ready for discharge on March 30  after bronchoscopy. Her most recent physical exam was performed that morning  when she was afebrile. Blood pressure was 100/70. Her lungs were clear with  no rales, wheezes, or rhonchi. Cardiovascular exam was unremarkable.   FINAL LABORATORY DATA:  June 21, 2002, white count was 9,900, hemoglobin  11.7 g, platelet count of 216,000. Final chemistries from June 14, 2002  with a sodium of 138, potassium 4.8, chloride 106, CO2 26, BUN 11,  creatinine 1.2, serum glucose was 132. The patient had liver functions June 13, 2002 that were normal. Cultures are as reported above.   DISCHARGE MEDICATIONS:  The patient was to continue all of her home  medications. In addition, she was to continue on Avelox until seen in  followup as an outpatient.   FOLLOW UP:  The patient was also to contact Dr. Maia Breslow office  after discharge to be rescheduled for TEE.   CONDITION ON DISCHARGE:  The patient was felt to be stable on discharge to home. Her condition at time of discharge was stable  and improved.  Rosalyn Gess Norins, M.D. [facility]    MEN/MEDQ  D:  02/19/2003  T:  02/19/2003  Job:  [id number]

## 2010-08-09 NOTE — Cardiovascular Report (Signed)
NAME:  [name], [name]                         ACCOUNT NO.:  [id number]   MEDICAL RECORD NO.:  [id number]                   PATIENT TYPE:  OIB   LOCATION:  6531                                 FACILITY:  [facility]   PHYSICIAN:  Madaline Savage, M.D.             DATE OF BIRTH:  1955-06-06   DATE OF PROCEDURE:  01/07/2002  DATE OF DISCHARGE:  01/08/2002                              CARDIAC CATHETERIZATION   PROCEDURE PERFORMED:  1. Left coronary angiography (the patient had a full diagnostic cardiac     catheterization on December 28, 2001).  2. Percutaneous transluminal coronary angioplasty of the ostial and proximal     diagonal branch of the left anterior descending artery.   COMPLICATIONS:  Distal dissection of the diagonal branch.   MEDICATIONS GIVEN:  Angiomax was given instead of heparin due to the  patient's chronically low platelet count of 80,000.  She was also given  intravenous nitroglycerin.  No heparin was given and on IIb/IIIa inhibitor  was given.   PATIENT PROFILE:  The patient is a 55 year old African-American woman who  had recently started having chest pain again after successful circumflex and  right coronary artery stenting previously.   Dr. Yates Decamp had performed diagnostic coronary catheterization on the 7th  showing that both stents were patent and that a new tighter lesion was noted  in the first diagonal branch of the LAD.  The patient had several platelet  counts determined during the course of the last 10 days to ensure that the  platelet count was stable.  The etiology of the low platelet count is not  entirely known and may be due to some of the medications she was taking for  her lupus erythematosus; however, the platelet count was stable.   RESULTS:  The coronary angiography showed the left anterior descending  coronary artery to be essentially normal except for luminal irregularities  throughout.  The circumflex contained a stent in the mid portion  of the  vessel that was widely patent.   The obtuse marginal branches were also unremarkable in appearance.   The percutaneous intervention was accomplished with a #7 [place] left Judkins  #4 guide catheter.  The guidewire used for the procedure was initially a  luge wire and then a Whisper wire.  Guidewire placement was the most  difficult part of the procedure.  Ostial diagonal angioplasty was  accomplished with a 2.0 x 20-mm Maverick balloon which was strategically  placed so as not to interfere with the LAD in any way.   After opening that, I was able to strategically steer a Whisper wire down  the main body of the trifurcating diagonal which was still small in  diameter.  When I did, I was able to then easily pass a balloon down the  diagonal and a 2.0 inflation to 4 atmospheres resulted in dissection of the  vessel and TIMI-2 class flow.  The patient had no ST segment changes on her  EKG.  She did have chest pressure that was increasingly improved with  intravenous nitroglycerin and morphine.  It is felt that stenting was not a  wise option here primarily because this is such a small vessel.  It is  probably, in retrospect, even smaller than 2.0.  The patient was clinically  stable and transferred to the unit 6500 for recovery.  The Angiomax was  discontinued on exit from the catheterization lab.    FINAL DIAGNOSIS:  A 90% stenosis in ostial diagonal reduced to 30% with  balloon angioplasty complicated by distal dissection of the vessel.                                                Madaline Savage, M.D.    WHG/MEDQ  D:  01/07/2002  T:  01/09/2002  Job:  [id number]   cc:   Cardiac Catheterization Lab   [facility]

## 2010-08-09 NOTE — Cardiovascular Report (Signed)
[name], [name]                         ACCOUNT NO.:  [id number]   MEDICAL RECORD NO.:  [id number]                   PATIENT TYPE:  OIB   LOCATION:  2899                                 FACILITY:  [facility]   PHYSICIAN:  Madaline Savage, M.D.             DATE OF BIRTH:  [birth year]   DATE OF PROCEDURE:  06/30/2003  DATE OF DISCHARGE:                              CARDIAC CATHETERIZATION   PROCEDURES PERFORMED:  1. Selective coronary angiography by Judkins technique.  2. Retrograde left heart catheterization.  3. Left ventricular angiography.   COMPLICATIONS:  None.   ENTRY SITE:  Right femoral.   DYE USED:  Omnipaque.   PATIENT PROFILE:  [name] is a 55 year old African-American woman who has  known coronary disease including previous myocardial infarction and stents  placed to the mid circumflex and mid right coronary artery.  There is also a  history of a small diagonal branch occlusion that has not been symptomatic  as of late.   Recently, the patient developed chest pain and was seen in the office and it  was felt to be clinically angina so we scheduled this outpatient cardiac  catheterization.   RESULTS:   PRESSURES:  Left ventricular pressure was 125/10, end-diastolic pressure 18.  Central aortic pressure 125/75, mean of 100.  No aortic valve gradient by  pullback technique.   ANGIOGRAPHIC RESULTS:  1. The left main coronary artery was normal.  2. The LAD coursed to the cardiac apex and contained a systolic ridge just     after diagonal branch two that was fairly focal and was fairly     unremarkable other than just being there.  The LAD itself and two     diagonal branches appear normal.  Multiple septal perforator branches are     normal.  The LAD courses to the cardiac apex and then bifurcates into two     small twin branches at the apex.  3. The left circumflex gives rise to two obtuse marginal branches.  The     first of which appears normal.  The second of  which is also normal and     bifurcates.  No lesions were seen.  The mid circumflex contains a     radioopaque stent that is widely patent throughout with no in-stent     restenosis whatsoever and there is TIMI-3 flow into the distal vessel.     There is an atrial circumflex branch arising from the distal circumflex     beyond patent stent.  4. The right coronary artery is a small vessel that contains lumpy, bumpy     irregularities throughout its proximal and mid portions. There is a     radioopaque stent in the mid portion of the vessel that is patent with no     in-stent restenosis and the distal vessel and  PDA are widely patent.   The left ventricular angiogram shows good  contractility of all wall segments  except for inferobasal wall which is mildly depressed.  Overall the ejection  fraction is 60%.  There is no, no mitral regurgitation seen at this time.  (Previously there was 3-4+ mitral regurgitation on the last  catheterization).   Abdominal aortography was not performed.   FINAL DIAGNOSES:  1. No significant residual coronary disease at this time with patent stents     to the mid circumflex and right coronary artery and luminal     irregularities only in the proximal right coronary artery.  2. Normal left ventricular systolic function.  3. No mitral regurgitation.   PLAN:  Medical therapy.                                               Madaline Savage, M.D.    WHG/MEDQ  D:  06/30/2003  T:  07/01/2003  Job:  [id number]   cc:   Rosalyn Gess. Norins, M.D. [facility]

## 2010-08-09 NOTE — Cardiovascular Report (Signed)
[place]  Patient:    [name], [name] Visit Number: [id number] MRN: [id number]          Service Type: MED Location: [facility] [id number] Attending Physician:  Ophelia Shoulder Dictated by:   Madaline Savage, M.D. Proc. Date: 04/14/01 Admit Date:  04/11/2001   CC:         Leilani Able, M.D.  Cardiac Catheterization Lab   Cardiac Catheterization  PROCEDURES PERFORMED: 1. Selective coronary angiography by Judkins technique. 2. Retrograde left heart catheterization. 3. Left ventricular angiography. 4. Right heart catheterization. 5. Percutaneous coronary intervention to the mid right coronary artery with    deployment of an intracoronary artery stent.  COMPLICATIONS:  None.  ENTRY SITE:  Right femoral.  DYE USED:  Omnipaque.  MEDICATIONS GIVEN:  Angiomax.  PATIENT PROFILE:  The patient is a 55 year old African-American female who had an acute myocardial infarction treated with direct coronary stenting of the circumflex coronary artery on April 11, 2001.  This morning she had an episode of chest pain that was reminiscent of her old angina and that factor plus her relative hypotension for the last three days prompted a trip to the catheterization lab today to relook at coronary anatomy and also to do a right heart catheterization to find out what cardiac outputs and filling pressures on the right side of the heart were.  RESULTS:  Right heart hemodynamics:  Right atrial mean 12.  Right ventricular pressure 34/15.  Pulmonary artery pressure 30/20.  Pulmonary capillary wedge pressure 18.  A wave 23, V wave 21.  Thermodilution cardiac output 4.0 and cardiac index 2.0.  Fick cardiac output was 7.2 and index was 3.6.  No aortic valve gradient.  No significant mitral valve gradient.  Left ventricular pressure 103/25.  Central aortic pressure 102/65.  ANGIOGRAPHIC RESULTS: 1. The left main coronary artery was normal. 2. The left  circumflex coronary artery stent was widely patent.  The obtuse    marginal branch coming off of the stented portion of the vessel did not    show any spasm.  The second obtuse marginal branch appeared normal.  A    distal portion of the circumflex contained a 50% ostial stenosis. 3. The LAD terminates on the anterior wall of the heart just before the apex.    It is a basically normal vessel.  There is a tiny diagonal branch which    contains a 90% stenosis proximally.  This vessel appears to be too small    for intervention. 4. The right coronary artery is a 2.5 to 2.75 vessel.  It contains a 40-50%    lesion proximally, an 80% in the mid portion at the acute marginal branch,    and a 40% margin distally.  Left ventriculography shows good contractility of all wall segments except for the inferior wall which is mildly hypokinetic.  Ejection fraction is 55%.  No mitral regurgitation.  INTERVENTIONAL NOTE:  This procedure was performed with a #6 [place] guide catheter with sideholes, a short Patriot wire, and a direct stent consisting of a 2.75 Bx Velocity stent 16-mm in length.  One inflation to 13 atmospheres corresponding to an estimated diameter of 2.85 was obtained and the 80% lesion was reduced to 0% residual with preservation of TIMI-3 class flow.  FINAL DIAGNOSIS: 1. Patency of circumflex stent placed several days ago. 2. Successful deployment of a new stent to the mid right coronary artery today    with reduction of an  80% lesion to 0%.  Residual stenosis in RCA, distal    circumflex, and first diagonal branch that were considered nonobstructive    and nonsignificant. 3. Preservation of left ventricular systolic function, ejection fraction 55%. 4. Normal right heart hemodynamics including high normal left ventricular    filling pressure also known as pulmonary capillary wedge pressure.  Cardiac    output does appear to be adequate.  RECOMMENDATIONS:  Medical therapy of recent  infarct including beta blockade, ACE inhibitors, and eventually a Cardiolite stress test. Dictated by:   Madaline Savage, M.D. Attending Physician:  Ophelia Shoulder DD:  04/14/01 TD:  04/15/01 Job: [id number]

## 2010-08-12 NOTE — Discharge Summary (Addendum)
NAMEMILEIGH, [name]               ACCOUNT NO.:  [id number]  MEDICAL RECORD NO.:  [id number]           PATIENT TYPE:  O  LOCATION:  2040                         FACILITY:  [facility]  PHYSICIAN:  Pricilla Riffle, MD, FACCDATE OF BIRTH:  12-Oct-1955  DATE OF ADMISSION:  08/03/2010 DATE OF DISCHARGE:  08/04/2010                              DISCHARGE SUMMARY   DISCHARGE DIAGNOSES: 1. Chest pain/neck pain, felt likely musculoskeletal.     a.     Normal CKs and normal cardiac enzymes x4. 2. Coronary artery disease.     a.     Status post bare-metal stent to the RCA and circumflex in      2003 as well as percutaneous transluminal coronary angioplasty to      the diagonal in 2003. 3. Hypothyroidism. 4. Gastroesophageal reflux disease. 5. Questionable lupus per history, the patient states she has been     told there is no evidence of lupus in her body but may have     connective tissue disorder.  For outpatient followup. 6. Valvular heart disease.     a.     Mild-to-moderate mitral regurgitation by echo in November      2010.     b.     Mild-to-moderate tricuspid regurgitation by echo in November      2010. 7. Lower extremity pain with negative lower extremity Dopplers since     2011. 8. Hyperlipidemia. 9. Idiopathic thrombocytopenic purpura status post splenectomy. 10.Normal left ventricular function with ejection fraction of 50-55%,     grade 1 diastolic dysfunction, February 16, 2009.  HOSPITAL COURSE:  [name] is a 55 year old female with a history of known CAD, hyperlipidemia, possible rheumatologic disorder in the form of connective tissue disorder with prior diagnosis of lupus who presented to [facility] with complaints of chest pain, and neck pain.  She had it for several hours, occurring at rest, starting the day prior to admission, eventually radiating to her shoulder blades and neck as well as with chest tightness.  This reminded her her prior MI, although she was  very tender to palpation.  She took nitroglycerin which may have eased the pain off, but the discomfort was still there, particularly when pressing in the subscapular region as well as substernal area.  Cardiac enzymes were negative x2.  She also had some suprapubic discomfort and UA was checked which was negative.  She had mildly decreased white blood cell count on admission, which normalized today.  She is completely ruled out for an MI.  Her pain was improved with tramadol and Vicodin.  It is most likely felt musculoskeletal in etiology at this time.  However, given her history of possible lupus/connective tissue disorder, she was educated on the importance of followup with her PCP, which she plans to do.  Dr. Tenny Craw has seen and examined her today and feels she is stable for discharge.  DISCHARGE LABORATORY DATA:  WBC 5, hemoglobin 13, hematocrit 37.5, platelet count 190.  Sodium 138, potassium 4, chloride 104, CO2 is 25, glucose 107, BUN 16, creatinine 0.93.  Cardiac enzymes negative x4. Total cholesterol  159, triglycerides 81, HDL 48, LDL 95.  UA completely negative.  Chest x-ray on Aug 03, 2010, shows vascular opacity in the right upper abdomen, no acute disease.  DISCHARGE MEDICATIONS: 1. Hydrocodone/APAP 5/325 mg one to two tablets q.8 h. p.r.n. moderate     to severe pain, dispensed only 10. 2. Aspirin 325 mg nightly. 3. Atenolol 25 mg nightly. 4. BC Powders one packet b.i.d. p.r.n. with instructions to only take     this medicine sparingly as she already is taking an aspirin daily. 5. Crestor 10 mg every evening. 6. Levothyroxine 50 mcg every morning. 7. Nitro sublingual 0.4 mg every 5 minutes as needed up to 3 doses for     chest pain. 8. Protonix 40 mg every morning. 9. Ramipril 1.25 mg every morning.  DISCHARGE DISPOSITION:  [name] will be discharged in stable condition to home.  She is to increase activity slowly and follow a low- sodium heart-healthy diet.  She  is to follow up with Dr. Clifton James in several weeks and our office will contact her with this appointment. Dr. Tenny Craw would like her to see Dr. Clifton James before scheduling further testing.  As discussed, she was also instructed to follow up with Dr. Yevette Edwards and Dr. Debby Bud.  DURATION OF DISCHARGE ENCOUNTER:  Greater than 30 minutes including physician and PA time.     Ronie Spies, P.A.C.   ______________________________ Pricilla Riffle, MD, [facility]    DD/MEDQ  D:  08/04/2010  T:  08/04/2010  Job:  [id number]  cc:   Verne Carrow, MD Estill Bamberg, MD Rosalyn Gess. Norins, MD  Electronically Signed by Ronie Spies  on 08/12/2010 01:48:43 PM Electronically Signed by Dietrich Pates MD [facility] on 08/15/2010 12:55:55 PM

## 2010-08-12 NOTE — H&P (Addendum)
[name], [name]               ACCOUNT NO.:  [id number]  MEDICAL RECORD NO.:  [id number]           PATIENT TYPE:  O  LOCATION:  2040                         FACILITY:  [facility]  PHYSICIAN:  Pricilla Riffle, MD, FACCDATE OF BIRTH:  Aug 18, 1955  DATE OF ADMISSION:  08/03/2010 DATE OF DISCHARGE:                             HISTORY & PHYSICAL   PRIMARY CARDIOLOGIST:  Verne Carrow, MD  PRIMARY MEDICAL DOCTOR:  Rosalyn Gess. Norins, MD  CHIEF COMPLAINT:  Chest pain, neck pain.  HISTORY OF PRESENT ILLNESS:  [name] is a 55 year old female with history of CAD status post bare-metal stent to the RCA and circumflex in 2003 with PTCA to the diagonal that year, with her last catheterization in 2008 showing patent stents and possible jailed acute marginal, although not a vessels that was intervenable.  She has a history of mild to moderate MR and TR, hypothyroidism, and questionable lupus. Yesterday, she began experiencing chest tightness, occurring at rest, which she initially attributed to nicotine withdrawal as she has been 72 hours without a cigarette intentionally.  Today, she felt different kind of pain, with a discomfort in her shoulder blades and neck as well as chest tightness with some nausea as well as a headache.  She describes this is very reminiscent of her prior MI, however, the pain is very localizable and extremely reproducible with palpation.  She took nitroglycerin, the pain eased off, although not quite right away.  She also notes feeling more dyspneic and lethargic lately with activity prior to quitting smoking.  Cardiac enzymes are negative x2 and EKG is without acute changes.  PAST HISTORY: 1. CAD.     a.     Status post bare-metal stent to the RCA and circumflex in      2003.     b.     Status post PTCA to the diagonal in 2003. 2. Hypothyroidism. 3. GERD. 4. Questionable lupus per history, although the patient states she has     been told that there is no  evidence of lupus in her body. 5. Valvular disease.     a.     Mild to moderate MR by echo in November 2010.     b.     Mild to moderate TR by echo in November 2010. 6. Lower extremity pain, Pravachol changed to Crestor, negative lower     extremity Dopplers in 2001. 7. PAD. 8. Hyperlipidemia. 9. ITP status post splenectomy. 10.Normal LV function with EF of 66-65% with grade 1 diastolic     dysfunction by echo in November 2010.  MEDICATIONS: 1. Altace 1.25 mg daily. 2. Nitroglycerin sublingual 0.4 mg as needed. 3. Protonix 40 mg daily. 4. Atenolol 25 mg daily. 5. Pressor 10 mg daily. 6. Levothyroxine 50 mcg daily. 7. BC Powders. 8. Aspirin 325 mg daily.  ALLERGIES:  No known drug allergies.  She does have a history of myalgias with Pravachol.  SOCIAL HISTORY:  [name] is single, lives alone.  She has 2 children. She is on disability, but is currently attending [employer] for religious studies.  She quit smoking 72 hours ago after  30 years of tobacco abuse. She endorses a very rare alcohol use and denies illicit drug use.  FAMILY HISTORY:  Her mother had an MI as well as multiple grandparents. Two siblings were born with holes in their hearts.  REVIEW OF SYSTEMS:  No fevers, chills, or cough.  Positive for nausea. No vomiting.  She had possibly some diarrhea, but no bowel movement today.  She does complain of urinary frequency.  All other systems are reviewed and are negative.  LABORATORY DATA:  WBC 3.7, hemoglobin 13.4, hematocrit 38.4, and platelet count 195.  Sodium 138, potassium 4, chloride 103, CO2 of 28, glucose 80, BUN 17, and creatinine 0.93.  Cardiac enzymes negative x2.  RADIOLOGY:  Chest x-ray showed no acute disease.  EKG:  Normal sinus rhythm and showed T-wave inversion in III and aVF with generally flat T-waves, similar to prior EKGs, although the T-wave inversions are more pronounced.  PHYSICAL EXAMINATION:  VITAL SIGNS:  Temperature 98.3, pulse  58, respirations 12, blood pressure 109/82, and pulse ox 98% on room air. GENERAL: :  this is a pleasant African American female in no acute distress. HEENT:  Normocephalic and atraumatic.  Extraocular movements are intact. Clear sclerae.  Nares are without discharge. NECK:  Supple without carotid bruit or JVD. BACK:  She has marked tenderness in the right suprascapular region and winces upon palpation. HEART:  Auscultation to the heart reveals S1 and S2 without murmurs, rubs, or gallops. CHEST:  Quite tender of the substernal region, which elicits the pain that she has been complaining of. LUNGS:  Clear to auscultation bilaterally. ABDOMEN:  Soft with normoactive bowel sounds and some left lower tenderness and suprapubic tenderness without rebound. EXTREMITIES:  Warm, dry, and without edema.  She has 2+ pedal pulses. NEUROLOGIC:  The patient is alert and oriented x3.  She responds questions appropriately with a normal affect.  ASSESSMENT/PLAN:  The patient was seen and examined by Dr. Tenny Craw and myself.  This is a 55 year old female with a history of coronary artery disease with prior stenting, hypothyroidism, gastroesophageal reflux disease, and valvular heart disease including mild to moderate mitral regurgitation and tricuspid regurgitation as well as normal ejection fraction who presents to [facility] with complaints of neck and back pain that are similar to her prior myocardial infarction, along with substernal chest pressure.  She reports feeling tuckered out with exertion, but has had these symptoms in the past.  Her exam is quite significant for tenderness brought on by palpation of her center chest, which brings on the pain that she states is her chief complaint.  She has tenderness of the right suprascapular region like the shoulder pain she had earlier today as well as some abdominal and suprapubic tenderness as well.  We are not entirely convinced that she is  having anginal chest pain at present, but she did respond to nitroglycerin.  At present, the chest and back discomfort appears musculoskeletal, but we cannot explain the tuckered out feeling or the abdominal pain.  Given her history, we will admit her for observation and rule out MI.  We will check a UA, TSH, and abdominal plain film.  If she rules out, we would consider an outpatient Myoview given her fatigue.  We will discontinue heparin, change the DVT dosing as well as change her IV nitro to nitro paste.  Plan was discussed with the patient who she in agreement.     Ronie Spies, P.A.C.   ______________________________ Pricilla Riffle, MD, [facility]  DD/MEDQ  D:  08/03/2010  T:  08/04/2010  Job:  [id number]  cc:   Verne Carrow, MD Rosalyn Gess. Norins, MD Lemmie Evens, M.D.  Electronically Signed by Ronie Spies  on 08/12/2010 01:48:47 PM Electronically Signed by Dietrich Pates MD [facility] on 08/15/2010 12:55:58 PM

## 2010-08-21 ENCOUNTER — Ambulatory Visit (INDEPENDENT_AMBULATORY_CARE_PROVIDER_SITE_OTHER): Payer: Medicare Other | Admitting: Cardiovascular Disease

## 2010-08-21 ENCOUNTER — Encounter: Payer: Self-pay | Admitting: Cardiovascular Disease

## 2010-08-21 VITALS — BP 122/92 | HR 60 | Resp 18 | Ht 66.0 in | Wt 222.8 lb

## 2010-08-21 DIAGNOSIS — I2510 Atherosclerotic heart disease of native coronary artery without angina pectoris: Secondary | ICD-10-CM

## 2010-08-21 NOTE — Assessment & Plan Note (Signed)
She is known to have CAD. Her CP may be related but is improved. Will continue with GI workup. If negative, will pursue stress testing. No med changes.

## 2010-08-21 NOTE — Patient Instructions (Signed)
Your physician recommends that you schedule a follow-up appointment in: 1 month  

## 2010-08-21 NOTE — Progress Notes (Signed)
History of Present Illness: 55 yo AAF with h/o CAD s/p bare metal stent RCA and Circumflex 2003, PTCA Diagonal 2003, hypothyroidism, moderate MR, GERD here today for cardiac follow up. She had previously been followed in [facility] clinic by Dr. Elsie Lincoln.  I saw her as a new patient in November 2010. She has had some chest pain at times since her stents were placed in 2003. Her echo in November 2010 showed normal LV size and function.  There was mild to moderate TR and MR. She has had pain in her back and in both legs at night. No exertional leg pain. Lower ext dopplers with no evidence of PAD in 2011. She was admitted to [facility] 08/04/10 with CP. Seen by Dr. Tenny Craw and ruled out for MI. She was seen in the [facility] ED on May 16th for N/V/CP.  CP was felt to be related to GI issues. She has seen Dr. Loreta Ave with GI and is scheduled for an upper endoscopy on June 22. She is on a PPI. She has stopped smoking for the last 30 days. Feeling better over last week.   Past Medical History  Diagnosis Date  . DVT (deep venous thrombosis) 04  . Peripheral vascular disease, unspecified   . Esophageal reflux   . Mitral valve insufficiency and aortic valve insufficiency   . Immune thrombocytopenic purpura   . Unspecified disease of pericardium   . Chronic ischemic heart disease, unspecified     w/ stents in mid circumflex and mid RCA in 03, and balloon angioplasty of Diagonal branch 10-03, last cath Sept 08- 2 vessel disease w/ patent stents  . Lupus erythematosus   . Hypothyroidism     Past Surgical History  Procedure Date  . Plastic surgical repair      dog bite to leg  . Splenectomy 5-04    Current Outpatient Prescriptions  Medication Sig Dispense Refill  . aspirin 325 MG tablet Take 325 mg by mouth daily.        Marland Kitchen atenolol (TENORMIN) 25 MG tablet Take 25 mg by mouth daily.        Marland Kitchen levothyroxine (SYNTHROID, LEVOTHROID) 50 MCG tablet Take 50 mcg by mouth daily.        . nitroGLYCERIN  (NITROSTAT) 0.4 MG SL tablet Place 0.4 mg under the tongue every 5 (five) minutes as needed.        . pantoprazole (PROTONIX) 40 MG tablet Take 40 mg by mouth daily.        . ramipril (ALTACE) 1.25 MG capsule TAKE ONE CAPSULE BY MOUTH DAILY  30 capsule  3  . rosuvastatin (CRESTOR) 10 MG tablet Take 10 mg by mouth daily.          Allergies  Allergen Reactions  . Ace Inhibitors     REACTION: cough    History   Social History  . Marital Status: Single    Spouse Name: N/A    Number of Children: N/A  . Years of Education: N/A   Occupational History  . Not on file.   Social History Main Topics  . Smoking status: Former Smoker -- 0.5 packs/day for 30 years    Types: Cigarettes  . Smokeless tobacco: Not on file   Comment: 31 days quit!!  . Alcohol Use: No  . Drug Use: No  . Sexually Active: Not on file   Other Topics Concern  . Not on file   Social History Narrative  . No narrative on file  Family History  Problem Relation Age of Onset  . Cancer Mother     breast and cervical  . Cancer Paternal Grandfather     colon  . Heart attack Other   . Coronary artery disease Other   . Diabetes Other   . Hypertension Other   . Hyperlipidemia Other     Review of Systems:  As stated in the HPI and otherwise negative.   BP 122/92  Pulse 60  Resp 18  Ht 5\' 6"  (1.676 m)  Wt 222 lb 12.8 oz (101.061 kg)  BMI 35.96 kg/m2  Physical Examination: General: Well developed, well nourished, NAD HEENT: OP clear, mucus membranes moist SKIN: warm, dry. No rashes. Neuro: No focal deficits Musculoskeletal: Muscle strength 5/5 all ext Psychiatric: Mood and affect normal Neck: No JVD, no carotid bruits, no thyromegaly, no lymphadenopathy. Lungs:Clear bilaterally, no wheezes, rhonci, crackles Cardiovascular: Regular rate and rhythm. No murmurs, gallops or rubs. Abdomen:Soft. Bowel sounds present. Non-tender.  Extremities: No lower extremity edema. Pulses are 2 + in the bilateral  DP/PT.  EKG:

## 2010-09-09 ENCOUNTER — Other Ambulatory Visit: Payer: Self-pay | Admitting: Internal Medicine

## 2010-09-11 ENCOUNTER — Ambulatory Visit: Payer: Self-pay | Admitting: Cardiovascular Disease

## 2010-09-27 ENCOUNTER — Ambulatory Visit: Payer: Medicare Other | Admitting: Cardiovascular Disease

## 2010-10-11 ENCOUNTER — Other Ambulatory Visit: Payer: Self-pay | Admitting: Cardiovascular Disease

## 2010-11-06 ENCOUNTER — Ambulatory Visit: Payer: Medicare Other | Admitting: Cardiovascular Disease

## 2010-11-14 ENCOUNTER — Telehealth: Payer: Self-pay | Admitting: Cardiovascular Disease

## 2010-11-14 ENCOUNTER — Other Ambulatory Visit: Payer: Self-pay | Admitting: Cardiology

## 2010-11-14 MED ORDER — ROSUVASTATIN CALCIUM 10 MG PO TABS: 10.0000 mg | ORAL_TABLET | Freq: Every day | ORAL | Status: DC

## 2010-11-14 NOTE — Telephone Encounter (Signed)
Returning call back to nurse.  

## 2010-11-14 NOTE — Telephone Encounter (Signed)
Refilled Crestor. 

## 2010-11-14 NOTE — Telephone Encounter (Signed)
Patient aware that I sent her Crestor to her pharmacy.

## 2011-01-03 LAB — POCT CARDIAC MARKERS
CKMB, poc: 1.2
CKMB, poc: <1 — ABNORMAL LOW
Myoglobin, poc: 66.3
Myoglobin, poc: 97.7
Operator id: 277751
Operator id: 285841
Troponin i, poc: <0.05
Troponin i, poc: <0.05

## 2011-01-03 LAB — I-STAT 8, (EC8 V) (CONVERTED LAB)
Acid-base deficit: 1
BUN: 16
Bicarbonate: 24.8 — ABNORMAL HIGH
Chloride: 107
Glucose, Bld: 91
HCT: 39
Hemoglobin: 13.3
Operator id: 277751
Potassium: 3.7
Sodium: 141
TCO2: 26
pCO2, Ven: 46.1
pH, Ven: 7.338 — ABNORMAL HIGH

## 2011-01-03 LAB — POCT I-STAT CREATININE
Creatinine, Ser: 1
Operator id: 277751

## 2011-01-03 LAB — PROTIME-INR
INR: 1
Prothrombin Time: 13.5

## 2011-01-08 ENCOUNTER — Other Ambulatory Visit: Payer: Self-pay

## 2011-01-08 MED ORDER — RAMIPRIL 1.25 MG PO CAPS: 1.2500 mg | ORAL_CAPSULE | Freq: Every day | ORAL | Status: DC

## 2011-02-02 ENCOUNTER — Other Ambulatory Visit: Payer: Self-pay | Admitting: Cardiovascular Disease

## 2011-03-02 ENCOUNTER — Other Ambulatory Visit: Payer: Self-pay | Admitting: Cardiovascular Disease

## 2011-03-31 ENCOUNTER — Other Ambulatory Visit: Payer: Self-pay | Admitting: Cardiovascular Disease

## 2011-04-08 ENCOUNTER — Telehealth: Payer: Self-pay | Admitting: *Deleted

## 2011-04-08 DIAGNOSIS — L930 Discoid lupus erythematosus: Secondary | ICD-10-CM

## 2011-04-08 DIAGNOSIS — E7800 Pure hypercholesterolemia, unspecified: Secondary | ICD-10-CM

## 2011-04-08 DIAGNOSIS — K219 Gastro-esophageal reflux disease without esophagitis: Secondary | ICD-10-CM

## 2011-04-08 DIAGNOSIS — E039 Hypothyroidism, unspecified: Secondary | ICD-10-CM

## 2011-04-08 DIAGNOSIS — E785 Hyperlipidemia, unspecified: Secondary | ICD-10-CM

## 2011-04-08 MED ORDER — LEVOTHYROXINE SODIUM 50 MCG PO TABS: 50.0000 ug | ORAL_TABLET | Freq: Every day | ORAL | Status: DC

## 2011-04-08 NOTE — Telephone Encounter (Signed)
May refill x 1 month, needs lab: orders entered and f/u OV to be scheduled.

## 2011-04-08 NOTE — Telephone Encounter (Signed)
Refill request for Synthroid . With 5 refills. Last refilled on 11.13.12. Pt has not had an OV in over a year. OK to refill?

## 2011-04-08 NOTE — Telephone Encounter (Signed)
Phoned pt. Refilled prescription & pt is making appt for OV & will be coming in for lab work.

## 2011-04-21 ENCOUNTER — Telehealth: Payer: Self-pay | Admitting: Cardiovascular Disease

## 2011-04-21 MED ORDER — ROSUVASTATIN CALCIUM 10 MG PO TABS: 10.0000 mg | ORAL_TABLET | Freq: Every day | ORAL | Status: DC

## 2011-04-21 NOTE — Telephone Encounter (Signed)
New problem Pt wants to know if she can get generic med for crestor. She said it is too expensive please let her know

## 2011-04-21 NOTE — Telephone Encounter (Signed)
Patient would like to have order the Genetic CRESTOR INSTEAD OF NAME BRAND NAME BECAUSE the brand name is IS TOO EXPENSIVE. A prescription for  Rosuvastatin 10 mg one tablet daily send to [facility] pharmacy per  Request, patient aware.

## 2011-04-22 ENCOUNTER — Other Ambulatory Visit (INDEPENDENT_AMBULATORY_CARE_PROVIDER_SITE_OTHER): Payer: Medicare Other

## 2011-04-22 ENCOUNTER — Encounter: Payer: Self-pay | Admitting: Internal Medicine

## 2011-04-22 ENCOUNTER — Ambulatory Visit (INDEPENDENT_AMBULATORY_CARE_PROVIDER_SITE_OTHER): Payer: Medicare Other | Admitting: Internal Medicine

## 2011-04-22 VITALS — BP 110/68 | HR 68 | Temp 99.0°F | Resp 14 | Wt 226.8 lb

## 2011-04-22 DIAGNOSIS — E039 Hypothyroidism, unspecified: Secondary | ICD-10-CM | POA: Diagnosis not present

## 2011-04-22 DIAGNOSIS — Z0000 Encounter for general adult medical examination without abnormal findings: Secondary | ICD-10-CM

## 2011-04-22 DIAGNOSIS — I739 Peripheral vascular disease, unspecified: Secondary | ICD-10-CM

## 2011-04-22 DIAGNOSIS — I059 Rheumatic mitral valve disease, unspecified: Secondary | ICD-10-CM

## 2011-04-22 DIAGNOSIS — I2510 Atherosclerotic heart disease of native coronary artery without angina pectoris: Secondary | ICD-10-CM | POA: Diagnosis not present

## 2011-04-22 DIAGNOSIS — K219 Gastro-esophageal reflux disease without esophagitis: Secondary | ICD-10-CM

## 2011-04-22 DIAGNOSIS — E7800 Pure hypercholesterolemia, unspecified: Secondary | ICD-10-CM | POA: Diagnosis not present

## 2011-04-22 DIAGNOSIS — E785 Hyperlipidemia, unspecified: Secondary | ICD-10-CM | POA: Diagnosis not present

## 2011-04-22 DIAGNOSIS — D693 Immune thrombocytopenic purpura: Secondary | ICD-10-CM

## 2011-04-22 DIAGNOSIS — L930 Discoid lupus erythematosus: Secondary | ICD-10-CM | POA: Diagnosis not present

## 2011-04-22 DIAGNOSIS — J019 Acute sinusitis, unspecified: Secondary | ICD-10-CM

## 2011-04-22 LAB — COMPREHENSIVE METABOLIC PANEL
ALT: 18 U/L (ref 0–35)
AST: 25 U/L (ref 0–37)
Albumin: 3.2 g/dL — ABNORMAL LOW (ref 3.5–5.2)
Alkaline Phosphatase: 82 U/L (ref 39–117)
BUN: 21 mg/dL (ref 6–23)
CO2: 25 mEq/L (ref 19–32)
Calcium: 8.6 mg/dL (ref 8.4–10.5)
Chloride: 109 mEq/L (ref 96–112)
Creatinine, Ser: 1.1 mg/dL (ref 0.4–1.2)
GFR: 63.58 mL/min (ref >60.00)
Glucose, Bld: 93 mg/dL (ref 70–99)
Potassium: 4.9 mEq/L (ref 3.5–5.1)
Sodium: 142 mEq/L (ref 135–145)
Total Bilirubin: 0.5 mg/dL (ref 0.3–1.2)
Total Protein: 7 g/dL (ref 6.0–8.3)

## 2011-04-22 LAB — HEPATIC FUNCTION PANEL
ALT: 18 U/L (ref 0–35)
AST: 25 U/L (ref 0–37)
Albumin: 3.2 g/dL — ABNORMAL LOW (ref 3.5–5.2)
Alkaline Phosphatase: 82 U/L (ref 39–117)
Bilirubin, Direct: 0.2 mg/dL (ref 0.0–0.3)
Total Bilirubin: 0.5 mg/dL (ref 0.3–1.2)
Total Protein: 7 g/dL (ref 6.0–8.3)

## 2011-04-22 LAB — LIPID PANEL
Cholesterol: 166 mg/dL (ref 0–200)
HDL: 49.3 mg/dL (ref >39.00)
LDL Cholesterol: 99 mg/dL (ref 0–99)
Total CHOL/HDL Ratio: 3
Triglycerides: 87 mg/dL (ref 0.0–149.0)
VLDL: 17.4 mg/dL (ref 0.0–40.0)

## 2011-04-22 LAB — SEDIMENTATION RATE: Sed Rate: 40 mm/hr — ABNORMAL HIGH (ref 0–22)

## 2011-04-22 LAB — TSH: TSH: 0.63 u[IU]/mL (ref 0.35–5.50)

## 2011-04-22 NOTE — Progress Notes (Signed)
Subjective:    Patient ID: [name], female    DOB: 10/18/1955, 56 y.o.   MRN: [id number]  HPI The patient is here for annual Medicare wellness examination and management of other chronic and acute problems.  She had request refill Rx for synthroid but had not been seen for over a year. In the interval she has followed with Dr. Sanjuana Kava with last vist in May '12. She was hospitalized for CP in May with a negative evaluation. She represented to the ED for N/V/CP with negative ED eval. She was referred to Dr. Shanon Ace-  EGD was negative except for hiatal hernia; Colonoscopy was normal. (no reports located in [facility]). She is current with mammography in October. She had PAP in June '12 - normal.   The risk factors are reflected in the social history.  The roster of all physicians providing medical care to patient - is listed in the Snapshot section of the chart.  Activities of daily living:  The patient is 100% inedpendent in all ADLs: dressing, toileting, feeding as well as independent mobility  Home safety : The patient has smoke detectors in the home. They wear seatbelts. No firearms at home. There is no violence in the home.   There is no risks for hepatitis, STDs or HIV. There is no   history of blood transfusion. They have no travel history to infectious disease endemic areas of the world.  The patient has seen their dentist in the last six month. They have seen their eye doctor in the last year. They deny any hearing difficulty and have not had audiologic testing in the last year.  They do not  have excessive sun exposure. Discussed the need for sun protection: hats, long sleeves and use of sunscreen if there is significant sun exposure.   Diet: the importance of a healthy diet is discussed. They do have a healthy diet.  The patient has no regular exercise program.  The benefits of regular aerobic exercise were discussed.  Depression screen: there are no signs or vegative  symptoms of depression- irritability, change in appetite, anhedonia, sadness/tearfullness.  Cognitive assessment: the patient manages all their financial and personal affairs and is actively engaged. Finishing her undergraduate degree with honors. Has scholarship for MA-[employer].  The following portions of the patient's history were reviewed and updated as appropriate: allergies, current medications, past family history, past medical history,  past surgical history, past social history  and problem list.  Vision, hearing, body mass index were assessed and reviewed.   During the course of the visit the patient was educated and counseled about appropriate screening and preventive services including : fall prevention , diabetes screening, nutrition counseling, colorectal cancer screening, and recommended immunizations.   Past Medical History  Diagnosis Date  . DVT (deep venous thrombosis) 04  . Peripheral vascular disease, unspecified   . Esophageal reflux   . Mitral valve insufficiency and aortic valve insufficiency   . Immune thrombocytopenic purpura   . Pericarditis, restrictive   . Chronic ischemic heart disease, unspecified     w/ stents in mid circumflex and mid RCA in 03, and balloon angioplasty of Diagonal branch 10-03, last cath Sept 08- 2 vessel disease w/ patent stents  . Lupus erythematosus   . Hypothyroidism    Past Surgical History  Procedure Date  . Plastic surgical repair      dog bite to leg  . Splenectomy 5-04   Family History  Problem Relation Age of  Onset  . Cancer Mother     breast and cervical  . Paget's disease of bone Mother   . Fibromyalgia Mother   . Cancer Paternal Grandfather     colon  . Heart attack Other   . Coronary artery disease Other   . Diabetes Other   . Hypertension Other   . Hyperlipidemia Other   . Hypertension Father   . Heart disease Father     PVD  . GER disease Father    History   Social History  . Marital Status:  Single    Spouse Name: N/A    Number of Children: N/A  . Years of Education: N/A   Occupational History  . Not on file.   Social History Main Topics  . Smoking status: Current Everyday Smoker -- 0.5 packs/day for 30 years    Types: Cigarettes  . Smokeless tobacco: Never Used   Comment: 31 days quit!!  . Alcohol Use: No  . Drug Use: No  . Sexually Active: Not on file   Other Topics Concern  . Not on file   Social History Narrative   HSG,  graduated from [employer] in [place]. In college [name] -grad '13 with relgious studies. [employer] - Fall '13 for MA-divinity. Marrried '73 - 2 years/ divorced. 2 son- '73, '75 - CP. 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        Review of Systems Constitutional:  Negative for fever, chills, activity change and unexpected weight change.  HEENT:  Negative for hearing loss, ear pain, congestion, neck stiffness and postnasal drip. Negative for sore throat or swallowing problems. Negative for dental complaints.   Eyes: Negative for vision loss or change in visual acuity.  Respiratory: Negative for chest tightness and wheezing. Negative for DOE.   Cardiovascular: Negative for chest pain or palpitations. No decreased exercise tolerance Gastrointestinal: No change in bowel habit. No bloating or gas. Yes on reflux or indigestion (off aspirin) Genitourinary: Negative for urgency, frequency, flank pain and difficulty urinating. Over active bladder at night.  Musculoskeletal: Negative for myalgias, back pain, arthralgias and gait problem.  Neurological: Negative for dizziness, tremors, weakness and headaches.  Hematological: Negative for adenopathy.  Psychiatric/Behavioral: Negative for behavioral problems and dysphoric mood.       Objective:   Physical Exam Filed Vitals:   04/22/11 1318  BP: 110/68  Pulse: 68  Temp: 99 F (37.2 C)    Resp: 14   Gen'l- overweight AA woman in no distress HEENT - [place]/at, C&S clear, PERRLA, Fundi - normal, left EAC/TM normal, blue tooth in right ear, oropharynx without lesions, teeth in good repair Neck - supple, no thyroid nodules, no tenderness Nodes - negative Breast - deferred to gyn and mammogram PUlm - normal respirations and breath sounds Cor - RRR, w/o m/r/g Pelvic/rectal- deferred to gyn and GI Ext - normal Neuro - A&O x 3, CN II-XII normal, MS normal, normal gait and station.  Derm - no suspicious lesions face, neck.  Abd- obese, BS+, no guarding or rebound  Lab Results  Component Value Date   WBC 4.6 08/07/2010   HGB 13.7 08/07/2010   HCT 38.0 08/07/2010   PLT 165 08/07/2010   GLUCOSE 93 04/22/2011   CHOL 166 04/22/2011   TRIG 87.0 04/22/2011   HDL 49.30 04/22/2011   LDLCALC 99 04/22/2011   ALT 18 04/22/2011   ALT 18  04/22/2011   AST 25 04/22/2011   AST 25 04/22/2011   NA 142 04/22/2011   K 4.9 04/22/2011   CL 109 04/22/2011   CREATININE 1.1 04/22/2011   BUN 21 04/22/2011   CO2 25 04/22/2011   TSH 0.63 04/22/2011               Assessment & Plan:

## 2011-04-23 ENCOUNTER — Telehealth: Payer: Self-pay | Admitting: Cardiovascular Disease

## 2011-04-23 DIAGNOSIS — E785 Hyperlipidemia, unspecified: Secondary | ICD-10-CM

## 2011-04-23 MED ORDER — ATORVASTATIN CALCIUM 20 MG PO TABS: 20.0000 mg | ORAL_TABLET | Freq: Every day | ORAL | Status: DC

## 2011-04-23 NOTE — Telephone Encounter (Signed)
New Msg: pt calling stating that she needs a substitute of crestor called in. Pt said there is no generic brand for crestor available and the brand name crestor is too expensive even with pt insurance. Please return pt call to discuss possible substitutes.

## 2011-04-23 NOTE — Telephone Encounter (Signed)
Spoke with pt and gave her recommendation from Dr. Clifton James. She will change to Atorvastatin and come in for lipid and liver profile in 12 weeks. Will send prescription to [facility] on [address].

## 2011-04-23 NOTE — Telephone Encounter (Signed)
Left message to call back  

## 2011-04-23 NOTE — Telephone Encounter (Signed)
We can try switching to Atorvastatin 20 mg po QHS. Thanks, chris

## 2011-04-23 NOTE — Telephone Encounter (Signed)
FU Call: Pt returning call to [facility]. Please return pt call after 3:30pm if possible.

## 2011-04-23 NOTE — Telephone Encounter (Signed)
Spoke with pt. She has been taking Crestor but her cost for this went up at the first of the year. She is asking if she can take a cheaper alternative. States she has not been able to take Zocor or pravachol in past due to muscle aches. Has been tolerating Crestor without difficulty.  She had lab work done at Dr. Alvera Novel office on April 22, 2011 but would like Dr. Clifton James to handle heart and cholesterol medications.  Pt saw Dr. Clifton James in May of 2012 but has not followed up. States she was unaware of follow up appts.  She plans to come back to see Dr. Clifton James in May but did not want to schedule at this time and will call to schedule.  Will send to Dr. Clifton James for suggestions as to possible alternative medication for Crestor.

## 2011-04-25 ENCOUNTER — Encounter: Payer: Self-pay | Admitting: Internal Medicine

## 2011-04-26 DIAGNOSIS — Z0000 Encounter for general adult medical examination without abnormal findings: Secondary | ICD-10-CM | POA: Insufficient documentation

## 2011-04-26 NOTE — Assessment & Plan Note (Signed)
Last CBC May '12 - OK

## 2011-04-26 NOTE — Assessment & Plan Note (Signed)
Stable. Normal renal function. No skin lesions. Generally asymptomatic

## 2011-04-26 NOTE — Assessment & Plan Note (Signed)
No c/o claudication or other symptoms. No report of skin breakdown.

## 2011-04-26 NOTE — Assessment & Plan Note (Signed)
Lab Results  Component Value Date   CHOL 166 04/22/2011   HDL 49.30 04/22/2011   LDLCALC 99 04/22/2011   TRIG 87.0 04/22/2011   CHOLHDL 3 04/22/2011   LDL is above goal of 80 or less in patient with known CAD.  Plan - no change in medication           Better adherence to low fat diet and an exercise regimen           Recommend follow-up lipid panel in 6 months

## 2011-04-26 NOTE — Assessment & Plan Note (Signed)
Stable with no active c/o at today's visit.

## 2011-04-26 NOTE — Assessment & Plan Note (Signed)
Lab Results  Component Value Date   TSH 0.63 04/22/2011   OK control. Plan - continue present medications

## 2011-04-26 NOTE — Assessment & Plan Note (Signed)
Interval medical history - significant for two ED/Hosp evaluations for chest pain followed by GI eval with EGD-neg. Her other medical problems have been stable. She is current with colorectal cancer and cervical cancer screening. She is over due for breast cancer screening with mammography. She is s/p splenectomy - most likely had pneumonia vaccine at that time. She is due for booster. She is due for tetanus booster.  In summary- a very nice woman with a complex medical history, with lupus erythematous at the core, who appears to be medically stable. She may return at her convenience for immunizations. She is asked to schedule a mammogram. She needs to be assiduous in her diet - low fat, and she needs to have a regular exercise program. She is asked to return in 6 months for follow-up lipid panel. She will return to Internal medicine in one year, sooner as needed.

## 2011-04-26 NOTE — Assessment & Plan Note (Signed)
Patient is doing well. She is followed closely by Dr. Sanjuana Kava. Risk factor modification efforts are active.  Plan - follow-up with cardiology as instructed.

## 2011-05-01 ENCOUNTER — Other Ambulatory Visit: Payer: Self-pay | Admitting: Cardiovascular Disease

## 2011-05-01 ENCOUNTER — Other Ambulatory Visit: Payer: Self-pay | Admitting: *Deleted

## 2011-05-01 MED ORDER — RAMIPRIL 1.25 MG PO CAPS: 1.2500 mg | ORAL_CAPSULE | Freq: Every day | ORAL | Status: DC

## 2011-05-02 ENCOUNTER — Other Ambulatory Visit: Payer: Self-pay | Admitting: Cardiovascular Disease

## 2011-05-22 ENCOUNTER — Ambulatory Visit (INDEPENDENT_AMBULATORY_CARE_PROVIDER_SITE_OTHER): Payer: Medicare Other | Admitting: Internal Medicine

## 2011-05-22 ENCOUNTER — Encounter: Payer: Self-pay | Admitting: Internal Medicine

## 2011-05-22 VITALS — BP 110/82 | HR 69 | Temp 98.6°F | Resp 18 | Wt 228.5 lb

## 2011-05-22 DIAGNOSIS — J329 Chronic sinusitis, unspecified: Secondary | ICD-10-CM | POA: Diagnosis not present

## 2011-05-22 DIAGNOSIS — Z23 Encounter for immunization: Secondary | ICD-10-CM | POA: Diagnosis not present

## 2011-05-22 MED ORDER — AMOXICILLIN-POT CLAVULANATE 875-125 MG PO TABS: 1.0000 | ORAL_TABLET | Freq: Two times a day (BID) | ORAL | Status: AC

## 2011-05-22 MED ORDER — PROMETHAZINE-CODEINE 6.25-10 MG/5ML PO SYRP: 5.0000 mL | ORAL_SOLUTION | ORAL | Status: AC | PRN

## 2011-05-22 NOTE — Progress Notes (Signed)
  Subjective:    Patient ID: BERLYN SAYLOR, female    DOB: [birth year], 56 y.o.   MRN: [id number]  HPI SUBJECTIVE:  ANEL CREIGHTON is a 56 y.o. female who complains of coryza, congestion, nasal blockage, post nasal drip, productive cough and generalized sinus pain for 5 days. She denies a history of chills, myalgias and sweats and denies a history of asthma. Patient does smoke cigarettes.   OBJECTIVE: She appears well, vital signs are as noted. Ears normal.  Throat and pharynx normal.  Neck supple. No adenopathy in the neck. Nose is congested. Sinuses non tender. The chest is clear, without wheezes or rales.  ASSESSMENT:  viral upper respiratory illness and sinusitis  PLAN: Symptomatic therapy suggested: push fluids, rest, use vaporizer or mist prn and return office visit prn if symptoms persist or worsen. Augmentin 875 mg bid x 7 days. Call or return to clinic prn if these symptoms worsen or fail to improve as anticipated.    Review of Systems     Objective:   Physical Exam        Assessment & Plan:

## 2011-05-22 NOTE — Patient Instructions (Signed)
Sinus infection - see handout below. Plan - Augmentin 875 mg twice a day for 10 days; promethazine w/ codeine 2 tsp at bedtime, 1 tsp every 4-6 hours during the day - watch of sedation; Sudafed 30 mg three times a day; Tylenol 1000 mg three times.  hydrate; vitamin C; gargle of choice; stove top vaporizer. Call if your symptoms get worse especially not being able to take medications, shortness of breath, increased pain.    Sinusitis Sinuses are air pockets within the bones of your face. The growth of bacteria within a sinus leads to infection. The infection prevents the sinuses from draining. This infection is called sinusitis. SYMPTOMS   There will be different areas of pain depending on which sinuses have become infected.  The maxillary sinuses often produce pain beneath the eyes.     Frontal sinusitis may cause pain in the middle of the forehead and above the eyes.  Other problems (symptoms) include:  Toothaches.     Colored, pus-like (purulent) drainage from the nose.     Swelling, warmth, and tenderness over the sinus areas may be signs of infection.  TREATMENT   Sinusitis is most often determined by an exam.X-rays may be taken. If x-rays have been taken, make sure you obtain your results or find out how you are to obtain them. Your caregiver may give you medications (antibiotics). These are medications that will help kill the bacteria causing the infection. You may also be given a medication (decongestant) that helps to reduce sinus swelling.   HOME CARE INSTRUCTIONS    Only take over-the-counter or prescription medicines for pain, discomfort, or fever as directed by your caregiver.     Drink extra fluids. Fluids help thin the mucus so your sinuses can drain more easily.     Applying either moist heat or ice packs to the sinus areas may help relieve discomfort.     Use saline nasal sprays to help moisten your sinuses. The sprays can be found at your local drugstore.  SEEK IMMEDIATE  MEDICAL CARE IF:  You have a fever.     You have increasing pain, severe headaches, or toothache.     You have nausea, vomiting, or drowsiness.     You develop unusual swelling around the face or trouble seeing.  MAKE SURE YOU:    Understand these instructions.     Will watch your condition.     Will get help right away if you are not doing well or get worse.  Document Released: 03/10/2005 Document Revised: 11/20/2010 Document Reviewed: 10/07/2006 [facility] Patient Information 2012 [place], [place].

## 2011-05-31 ENCOUNTER — Other Ambulatory Visit: Payer: Self-pay | Admitting: Internal Medicine

## 2011-05-31 ENCOUNTER — Other Ambulatory Visit: Payer: Self-pay | Admitting: Cardiovascular Disease

## 2011-07-04 ENCOUNTER — Encounter: Payer: Self-pay | Admitting: Internal Medicine

## 2011-07-04 ENCOUNTER — Ambulatory Visit (INDEPENDENT_AMBULATORY_CARE_PROVIDER_SITE_OTHER): Payer: Medicare Other | Admitting: Internal Medicine

## 2011-07-04 VITALS — BP 102/68 | HR 69 | Temp 98.4°F | Ht 67.0 in | Wt 231.0 lb

## 2011-07-04 DIAGNOSIS — J209 Acute bronchitis, unspecified: Secondary | ICD-10-CM | POA: Insufficient documentation

## 2011-07-04 DIAGNOSIS — R062 Wheezing: Secondary | ICD-10-CM

## 2011-07-04 MED ORDER — AZITHROMYCIN 250 MG PO TABS: ORAL_TABLET | ORAL | Status: AC

## 2011-07-04 MED ORDER — PREDNISONE 10 MG PO TABS: 10.0000 mg | ORAL_TABLET | Freq: Every day | ORAL | Status: DC

## 2011-07-04 MED ORDER — HYDROCODONE-HOMATROPINE 5-1.5 MG/5ML PO SYRP: 5.0000 mL | ORAL_SOLUTION | Freq: Four times a day (QID) | ORAL | Status: AC | PRN

## 2011-07-04 NOTE — Patient Instructions (Signed)
Take all new medications as prescribed Continue all other medications as before  

## 2011-07-05 ENCOUNTER — Encounter: Payer: Self-pay | Admitting: Internal Medicine

## 2011-07-05 NOTE — Progress Notes (Signed)
Subjective:    Patient ID: [name], female    DOB: [birth year], 56 y.o.   MRN: [id number]  HPI  Here with acute onset mild to mod 2-3 days ST, HA, general weakness and malaise, with prod cough greenish sputum, but Pt denies chest pain, increased sob or doe, wheezing, orthopnea, PND, increased LE swelling, palpitations, dizziness or syncope, except for onset mild wheezing yesterday. Past Medical History  Diagnosis Date  . DVT (deep venous thrombosis) 04  . Peripheral vascular disease, unspecified   . Esophageal reflux   . Mitral valve insufficiency and aortic valve insufficiency   . Immune thrombocytopenic purpura   . Unspecified disease of pericardium   . Chronic ischemic heart disease, unspecified     w/ stents in mid circumflex and mid RCA in 03, and balloon angioplasty of Diagonal branch 10-03, last cath Sept 08- 2 vessel disease w/ patent stents  . Lupus erythematosus   . Hypothyroidism    Past Surgical History  Procedure Date  . Plastic surgical repair      dog bite to leg  . Splenectomy 5-04    reports that she has been smoking Cigarettes.  She has a 15 pack-year smoking history. She has never used smokeless tobacco. She reports that she does not drink alcohol or use illicit drugs. family history includes Cancer in her mother and paternal grandfather; Coronary artery disease in her other; Diabetes in her other; Fibromyalgia in her mother; GER disease in her father; Heart attack in her other; Heart disease in her father; Hyperlipidemia in her other; Hypertension in her father and other; and Paget's disease of bone in her mother. Allergies  Allergen Reactions  . Ace Inhibitors     REACTION: cough   Current Outpatient Prescriptions on File Prior to Visit  Medication Sig Dispense Refill  . aspirin 325 MG tablet Take 325 mg by mouth daily.        Marland Kitchen atenolol (TENORMIN) 25 MG tablet TAKE 1 TABLET BY MOUTH EVERY DAY  30 tablet  11  . atorvastatin (LIPITOR) 20 MG tablet Take 1  tablet (20 mg total) by mouth daily.  30 tablet  11  . B Complex Vitamins (VITAMIN B COMPLEX PO) Take by mouth daily.      Marland Kitchen levothyroxine (SYNTHROID, LEVOTHROID) 50 MCG tablet TAKE 1 TABLET (50 MCG TOTAL) BY MOUTH DAILY.  30 tablet  1  . nitroGLYCERIN (NITROSTAT) 0.4 MG SL tablet Place 0.4 mg under the tongue every 5 (five) minutes as needed.        . pantoprazole (PROTONIX) 40 MG tablet TAKE 1 TABLET BY MOUTH EVERY DAY  30 tablet  5  . ramipril (ALTACE) 1.25 MG capsule Take 1 capsule (1.25 mg total) by mouth daily.  30 capsule  1    Review of Systems All otherwise neg per pt     Objective:   Physical Exam BP 102/68  Pulse 69  Temp(Src) 98.4 F (36.9 C) (Oral)  Ht 5\' 7"  (1.702 m)  Wt 231 lb (104.781 kg)  BMI 36.18 kg/m2  SpO2 99% Physical Exam  VS noted, mild ill Constitutional: Pt appears well-developed and well-nourished.  HENT: Head: Normocephalic.  Right Ear: External ear normal.  Left Ear: External ear normal.  Bilat tm's mild erythema.  Sinus nontender.  Pharynx mild erythema Eyes: Conjunctivae and EOM are normal. Pupils are equal, round, and reactive to light.  Neck: Normal range of motion. Neck supple.  Cardiovascular: Normal rate and regular rhythm.   Pulmonary/Chest:  Effort normal and breath sounds decresed with midl bilat wheezing Neurological: Pt is alert. No cranial nerve deficit.  Skin: Skin is warm. No edema    Assessment & Plan:

## 2011-07-05 NOTE — Assessment & Plan Note (Signed)
Mild to mod, for predpack asd,  to f/u any worsening symptoms or concerns 

## 2011-07-05 NOTE — Assessment & Plan Note (Signed)
Mild to mod, for antibx course,  to f/u any worsening symptoms or concerns 

## 2011-07-09 ENCOUNTER — Ambulatory Visit (INDEPENDENT_AMBULATORY_CARE_PROVIDER_SITE_OTHER): Payer: Medicare Other | Admitting: *Deleted

## 2011-07-09 DIAGNOSIS — E785 Hyperlipidemia, unspecified: Secondary | ICD-10-CM

## 2011-07-09 LAB — HEPATIC FUNCTION PANEL
ALT: 18 U/L (ref 0–35)
AST: 19 U/L (ref 0–37)
Albumin: 2.7 g/dL — ABNORMAL LOW (ref 3.5–5.2)
Alkaline Phosphatase: 86 U/L (ref 39–117)
Bilirubin, Direct: 0 mg/dL (ref 0.0–0.3)
Total Bilirubin: 0.2 mg/dL — ABNORMAL LOW (ref 0.3–1.2)
Total Protein: 6.1 g/dL (ref 6.0–8.3)

## 2011-07-09 LAB — LIPID PANEL
Cholesterol: 196 mg/dL (ref 0–200)
HDL: 57.4 mg/dL (ref >39.00)
LDL Cholesterol: 114 mg/dL — ABNORMAL HIGH (ref 0–99)
Total CHOL/HDL Ratio: 3
Triglycerides: 124 mg/dL (ref 0.0–149.0)
VLDL: 24.8 mg/dL (ref 0.0–40.0)

## 2011-07-10 ENCOUNTER — Telehealth: Payer: Self-pay | Admitting: Cardiovascular Disease

## 2011-07-10 ENCOUNTER — Other Ambulatory Visit: Payer: Self-pay | Admitting: *Deleted

## 2011-07-10 DIAGNOSIS — E785 Hyperlipidemia, unspecified: Secondary | ICD-10-CM

## 2011-07-10 MED ORDER — ATORVASTATIN CALCIUM 40 MG PO TABS: 40.0000 mg | ORAL_TABLET | Freq: Every evening | ORAL | Status: DC

## 2011-07-10 NOTE — Progress Notes (Signed)
Pt notified of lab results and new Rx for atorvastatin 40mg  QHS sent in to pharmacy.

## 2011-07-10 NOTE — Telephone Encounter (Signed)
Fu call °Patient returning your call °

## 2011-07-24 ENCOUNTER — Encounter: Payer: Self-pay | Admitting: Cardiovascular Disease

## 2011-07-24 ENCOUNTER — Ambulatory Visit (INDEPENDENT_AMBULATORY_CARE_PROVIDER_SITE_OTHER): Payer: Medicare Other | Admitting: Cardiovascular Disease

## 2011-07-24 VITALS — BP 110/80 | HR 78 | Ht 67.0 in | Wt 232.0 lb

## 2011-07-24 DIAGNOSIS — I2510 Atherosclerotic heart disease of native coronary artery without angina pectoris: Secondary | ICD-10-CM

## 2011-07-24 DIAGNOSIS — M79609 Pain in unspecified limb: Secondary | ICD-10-CM

## 2011-07-24 DIAGNOSIS — M79605 Pain in left leg: Secondary | ICD-10-CM | POA: Insufficient documentation

## 2011-07-24 DIAGNOSIS — M79604 Pain in right leg: Secondary | ICD-10-CM | POA: Insufficient documentation

## 2011-07-24 LAB — CK: Total CK: 183 U/L — ABNORMAL HIGH (ref 7–177)

## 2011-07-24 NOTE — Assessment & Plan Note (Signed)
Stable. No recent chest pain. Continue current meds. Will remove Lipitor from her list. She has stopped taking because of leg pain and swelling.

## 2011-07-24 NOTE — Assessment & Plan Note (Signed)
She had normal arterial dopplers in 2011. Her pulses are not brisk. I suspect that this is pain related to her edema which is resolving. Will get repeat LE dopplers to exclude PAD. Will check BMET and CK level today. I do not think this represents DVT as the swelling has been bilateral and is now resolved.

## 2011-07-24 NOTE — Assessment & Plan Note (Signed)
Complete cessation is required.

## 2011-07-24 NOTE — Patient Instructions (Signed)
Your physician recommends that you schedule a follow-up appointment in: 3-4 weeks.   Your physician has requested that you have a lower  extremity arterial duplex. This test is an ultrasound of the arteries in the legs or arms. It looks at arterial blood flow in the legs. Allow one hour for Lower . Arterial scans. There are no restrictions or special instructions

## 2011-07-24 NOTE — Progress Notes (Signed)
History of Present Illness: 56 yo AAF with h/o CAD s/p bare metal stent RCA and Circumflex 2003, PTCA Diagonal 2003, hypothyroidism, moderate MR, GERD here today for cardiac follow up. She had previously been followed in [facility] Cardiology clinic by Dr. Elsie Lincoln. I saw her as a new patient in November 2010.  Her echo in November 2010 showed normal LV size and function. There was mild to moderate TR and MR. She has had pain in her back and in both legs at night. No exertional leg pain. Lower ext dopplers with no evidence of PAD in 2011. She was admitted to [facility] 08/04/10 with CP. Seen by Dr. Tenny Craw and ruled out for MI. She was seen in the [facility] ED on Aug 07, 2010 for N/V/CP. CP was felt to be related to GI issues. She has seen Dr. Loreta Ave with GI and had an upper endoscopy on September 13, 2010 which only showed hiatal hernia. Colonoscopy is reported as negative.  She is on a PPI. She has stopped smoking. Recently seen by primary care for sinusitis and last month for bronchitis.   She tells me today that her legs began swelling when she increased her dose of Lipitor to 40 mg per day 2 weeks ago. She says that both of her legs were swollen equally. The swelling has resolved off of Lipitor but she still has pain in both legs, worse on the right. No change with ambulation. No history of PAD. Normal LE arterial dopplers in 2011. No history of documented DVT per pt. No recent chest pains. Breathing is getting back to normal.   Primary Care Physician: Norins  Last Lipid Profile:  Lipid Panel     Component Value Date/Time   CHOL 196 07/09/2011 0917   TRIG 124.0 07/09/2011 0917   HDL 57.40 07/09/2011 0917   CHOLHDL 3 07/09/2011 0917   VLDL 24.8 07/09/2011 0917   LDLCALC 114* 07/09/2011 0917     Past Medical History  Diagnosis Date  . DVT (deep venous thrombosis) 04  . Peripheral vascular disease, unspecified   . Esophageal reflux   . Mitral valve insufficiency and aortic valve insufficiency   .  Immune thrombocytopenic purpura   . Unspecified disease of pericardium   . Chronic ischemic heart disease, unspecified     w/ stents in mid circumflex and mid RCA in 03, and balloon angioplasty of Diagonal branch 10-03, last cath Sept 08- 2 vessel disease w/ patent stents  . Lupus erythematosus   . Hypothyroidism     Past Surgical History  Procedure Date  . Plastic surgical repair      dog bite to leg  . Splenectomy 5-04    Current Outpatient Prescriptions  Medication Sig Dispense Refill  . aspirin 325 MG tablet Take 325 mg by mouth daily.        Marland Kitchen atenolol (TENORMIN) 25 MG tablet TAKE 1 TABLET BY MOUTH EVERY DAY  30 tablet  11  . atorvastatin (LIPITOR) 40 MG tablet Take 1 tablet (40 mg total) by mouth every evening.  30 tablet  11  . B Complex Vitamins (VITAMIN B COMPLEX PO) Take by mouth daily.      Marland Kitchen levothyroxine (SYNTHROID, LEVOTHROID) 50 MCG tablet TAKE 1 TABLET (50 MCG TOTAL) BY MOUTH DAILY.  30 tablet  1  . nitroGLYCERIN (NITROSTAT) 0.4 MG SL tablet Place 0.4 mg under the tongue every 5 (five) minutes as needed.        . pantoprazole (PROTONIX) 40 MG  tablet TAKE 1 TABLET BY MOUTH EVERY DAY  30 tablet  5  . ramipril (ALTACE) 1.25 MG capsule Take 1 capsule (1.25 mg total) by mouth daily.  30 capsule  1    Allergies  Allergen Reactions  . Ace Inhibitors     REACTION: cough    History   Social History  . Marital Status: Single    Spouse Name: N/A    Number of Children: N/A  . Years of Education: N/A   Occupational History  . Not on file.   Social History Main Topics  . Smoking status: Current Everyday Smoker -- 0.5 packs/day for 30 years    Types: Cigarettes  . Smokeless tobacco: Never Used   Comment: 31 days quit!!  . Alcohol Use: No  . Drug Use: No  . Sexually Active: Not on file   Other Topics Concern  . Not on file   Social History Narrative   HSG,  graduated from [employer] in [place]. In college [name] -grad '13 with relgious studies.  [employer] - Fall '13 for MA-divinity. Marrried '73 - 2 years/ divorced. 2 son- '73, '75 - CP. 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     Family History  Problem Relation Age of Onset  . Cancer Mother     breast and cervical  . Paget's disease of bone Mother   . Fibromyalgia Mother   . Cancer Paternal Grandfather     colon  . Heart attack Other   . Coronary artery disease Other   . Diabetes Other   . Hypertension Other   . Hyperlipidemia Other   . Hypertension Father   . Heart disease Father     PVD  . GER disease Father     Review of Systems:  As stated in the HPI and otherwise negative.   BP 110/80  Pulse 78  Ht 5\' 7"  (1.702 m)  Wt 232 lb (105.235 kg)  BMI 36.34 kg/m2  Physical Examination: General: Well developed, well nourished, NAD HEENT: OP clear, mucus membranes moist SKIN: warm, dry. No rashes. Neuro: No focal deficits Musculoskeletal: Muscle strength 5/5 all ext Psychiatric: Mood and affect normal Neck: No JVD, no carotid bruits, no thyromegaly, no lymphadenopathy. Lungs:Clear bilaterally, no wheezes, rhonci, crackles Cardiovascular: Regular rate and rhythm. No murmurs, gallops or rubs. Abdomen:Soft. Bowel sounds present. Non-tender.  Extremities: No lower extremity edema. Pulses are non-palpable in the right  DP/PT and trace in the left DP/PT

## 2011-07-25 LAB — BASIC METABOLIC PANEL
BUN: 15 mg/dL (ref 6–23)
CO2: 28 mEq/L (ref 19–32)
Calcium: 8.5 mg/dL (ref 8.4–10.5)
Chloride: 108 mEq/L (ref 96–112)
Creatinine, Ser: 0.9 mg/dL (ref 0.4–1.2)
GFR: 82.38 mL/min (ref >60.00)
Glucose, Bld: 90 mg/dL (ref 70–99)
Potassium: 4.4 mEq/L (ref 3.5–5.1)
Sodium: 141 mEq/L (ref 135–145)

## 2011-07-28 ENCOUNTER — Telehealth: Payer: Self-pay | Admitting: Cardiovascular Disease

## 2011-07-28 NOTE — Telephone Encounter (Signed)
Spoke with pt and reviewed lab results with her.

## 2011-07-28 NOTE — Telephone Encounter (Signed)
Fu call °Pt returning your call  °

## 2011-07-29 ENCOUNTER — Encounter (INDEPENDENT_AMBULATORY_CARE_PROVIDER_SITE_OTHER): Payer: Medicare Other

## 2011-07-29 DIAGNOSIS — I739 Peripheral vascular disease, unspecified: Secondary | ICD-10-CM

## 2011-07-29 DIAGNOSIS — I2510 Atherosclerotic heart disease of native coronary artery without angina pectoris: Secondary | ICD-10-CM

## 2011-07-30 ENCOUNTER — Other Ambulatory Visit: Payer: Self-pay | Admitting: Internal Medicine

## 2011-08-19 ENCOUNTER — Other Ambulatory Visit (INDEPENDENT_AMBULATORY_CARE_PROVIDER_SITE_OTHER): Payer: Medicare Other

## 2011-08-19 ENCOUNTER — Ambulatory Visit (INDEPENDENT_AMBULATORY_CARE_PROVIDER_SITE_OTHER): Payer: Medicare Other | Admitting: Endocrinology

## 2011-08-19 ENCOUNTER — Encounter: Payer: Self-pay | Admitting: Endocrinology

## 2011-08-19 VITALS — BP 112/80 | HR 66 | Temp 98.2°F | Ht 67.0 in | Wt 231.0 lb

## 2011-08-19 DIAGNOSIS — R0989 Other specified symptoms and signs involving the circulatory and respiratory systems: Secondary | ICD-10-CM

## 2011-08-19 DIAGNOSIS — R0602 Shortness of breath: Secondary | ICD-10-CM | POA: Insufficient documentation

## 2011-08-19 DIAGNOSIS — R0609 Other forms of dyspnea: Secondary | ICD-10-CM

## 2011-08-19 DIAGNOSIS — R0600 Dyspnea, unspecified: Secondary | ICD-10-CM | POA: Insufficient documentation

## 2011-08-19 DIAGNOSIS — R609 Edema, unspecified: Secondary | ICD-10-CM | POA: Diagnosis not present

## 2011-08-19 LAB — URINALYSIS, ROUTINE W REFLEX MICROSCOPIC
Bilirubin Urine: NEGATIVE
Hgb urine dipstick: NEGATIVE
Ketones, ur: NEGATIVE
Leukocytes, UA: NEGATIVE
Nitrite: NEGATIVE
Specific Gravity, Urine: 1.025 (ref 1.000–1.030)
Total Protein, Urine: >=300
Urine Glucose: NEGATIVE
Urobilinogen, UA: 0.2 (ref 0.0–1.0)
pH: 6 (ref 5.0–8.0)

## 2011-08-19 LAB — BRAIN NATRIURETIC PEPTIDE: Pro B Natriuretic peptide (BNP): 190 pg/mL — ABNORMAL HIGH (ref 0.0–100.0)

## 2011-08-19 LAB — BASIC METABOLIC PANEL
BUN: 15 mg/dL (ref 6–23)
CO2: 25 mEq/L (ref 19–32)
Calcium: 8.2 mg/dL — ABNORMAL LOW (ref 8.4–10.5)
Chloride: 110 mEq/L (ref 96–112)
Creatinine, Ser: 0.9 mg/dL (ref 0.4–1.2)
GFR: 84.5 mL/min (ref >60.00)
Glucose, Bld: 85 mg/dL (ref 70–99)
Potassium: 4.4 mEq/L (ref 3.5–5.1)
Sodium: 142 mEq/L (ref 135–145)

## 2011-08-19 MED ORDER — FUROSEMIDE 20 MG PO TABS: 20.0000 mg | ORAL_TABLET | Freq: Every day | ORAL | Status: DC

## 2011-08-19 NOTE — Patient Instructions (Signed)
blood tests are being requested for you today.  You will receive a letter with results.  

## 2011-08-19 NOTE — Progress Notes (Signed)
Subjective:    Patient ID: [name], female    DOB: 1956/03/04, 56 y.o.   MRN: [id number]  HPI Pt states few mos of worsening of the chronic edema of the legs, and assoc foamy urine.  It is less now.  Past Medical History  Diagnosis Date  . DVT (deep venous thrombosis) 04  . Peripheral vascular disease, unspecified   . Esophageal reflux   . Mitral valve insufficiency and aortic valve insufficiency   . Immune thrombocytopenic purpura   . Unspecified disease of pericardium   . Chronic ischemic heart disease, unspecified     w/ stents in mid circumflex and mid RCA in 03, and balloon angioplasty of Diagonal branch 10-03, last cath Sept 08- 2 vessel disease w/ patent stents  . Lupus erythematosus   . Hypothyroidism     Past Surgical History  Procedure Date  . Plastic surgical repair      dog bite to leg  . Splenectomy 5-04    History   Social History  . Marital Status: Single    Spouse Name: N/A    Number of Children: N/A  . Years of Education: N/A   Occupational History  . Not on file.   Social History Main Topics  . Smoking status: Current Everyday Smoker -- 0.5 packs/day for 30 years    Types: Cigarettes  . Smokeless tobacco: Never Used   Comment: 31 days quit!!  . Alcohol Use: No  . Drug Use: No  . Sexually Active: Not on file   Other Topics Concern  . Not on file   Social History Narrative   HSG,  graduated from [employer] in [place]. In college [name] -grad '13 with relgious studies. [employer] - Fall '13 for MA-divinity. Marrried '73 - 2 years/ divorced. 2 son- '73, '75 - CP. 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     Current Outpatient Prescriptions on File Prior to Visit  Medication Sig Dispense Refill  . aspirin 325 MG tablet Take 325 mg by mouth daily.        Marland Kitchen atenolol (TENORMIN) 25 MG tablet TAKE 1 TABLET BY MOUTH EVERY DAY  30 tablet   11  . B Complex Vitamins (VITAMIN B COMPLEX PO) Take by mouth daily.      Marland Kitchen levothyroxine (SYNTHROID, LEVOTHROID) 50 MCG tablet TAKE 1 TABLET (50 MCG TOTAL) BY MOUTH DAILY.  30 tablet  5  . nitroGLYCERIN (NITROSTAT) 0.4 MG SL tablet Place 0.4 mg under the tongue every 5 (five) minutes as needed.        . pantoprazole (PROTONIX) 40 MG tablet TAKE 1 TABLET BY MOUTH EVERY DAY  30 tablet  5  . ramipril (ALTACE) 1.25 MG capsule Take 1 capsule (1.25 mg total) by mouth daily.  30 capsule  1  . furosemide (LASIX) 20 MG tablet Take 1 tablet (20 mg total) by mouth daily.  30 tablet  3    Allergies  Allergen Reactions  . Ace Inhibitors     REACTION: cough    Family History  Problem Relation Age of Onset  . Cancer Mother     breast and cervical  . Paget's disease of bone Mother   . Fibromyalgia Mother   . Cancer Paternal Grandfather     colon  . Heart attack Other   . Coronary artery disease Other   . Diabetes Other   .  Hypertension Other   . Hyperlipidemia Other   . Hypertension Father   . Heart disease Father     PVD  . GER disease Father     BP 112/80  Pulse 66  Temp(Src) 98.2 F (36.8 C) (Oral)  Ht 5\' 7"  (1.702 m)  Wt 231 lb (104.781 kg)  BMI 36.18 kg/m2  SpO2 97%  Review of Systems She has slight doe and weight gain.      Objective:   Physical Exam VITAL SIGNS:  See vs page GENERAL: no distress LUNGS:  Clear to auscultation Ext: 1+ bilat leg edema   ua is neg BNP is high Ca++=8.2    Assessment & Plan:  Edema, new, uncertain etiology The elev BNP suggests overall fluid overload Hypocalcemia, mild, new "foamy urine," no proteinuria is found.

## 2011-08-22 ENCOUNTER — Encounter: Payer: Self-pay | Admitting: Cardiovascular Disease

## 2011-08-22 ENCOUNTER — Ambulatory Visit (INDEPENDENT_AMBULATORY_CARE_PROVIDER_SITE_OTHER): Payer: Medicare Other | Admitting: Cardiovascular Disease

## 2011-08-22 VITALS — BP 120/72 | HR 68 | Ht 67.0 in | Wt 232.0 lb

## 2011-08-22 DIAGNOSIS — I2510 Atherosclerotic heart disease of native coronary artery without angina pectoris: Secondary | ICD-10-CM

## 2011-08-22 DIAGNOSIS — R609 Edema, unspecified: Secondary | ICD-10-CM

## 2011-08-22 NOTE — Assessment & Plan Note (Signed)
Stable. Off of statin. Did not tolerate. No other changes. She has stopped smoking.

## 2011-08-22 NOTE — Patient Instructions (Signed)
Your physician wants you to follow-up in:  12 months.  You will receive a reminder letter in the mail two months in advance. If you don't receive a letter, please call our office to schedule the follow-up appointment.   

## 2011-08-22 NOTE — Progress Notes (Signed)
History of Present Illness: 56 yo AAF with h/o CAD s/p bare metal stent RCA and Circumflex 2003, PTCA Diagonal 2003, hypothyroidism, moderate MR, GERD here today for cardiac follow up. She had previously been followed in [facility] Cardiology clinic by Dr. Elsie Lincoln. I saw her as a new patient in November 2010. Her echo in November 2010 showed normal LV size and function. There was mild to moderate TR and MR. She has had pain in her back and in both legs at night. No exertional leg pain. Lower ext dopplers with no evidence of PAD in 2011. She was admitted to [facility] 08/04/10 with CP. Seen by Dr. Tenny Craw and ruled out for MI. She was seen in the [facility] ED on Aug 07, 2010 for N/V/CP. CP was felt to be related to GI issues. She has seen Dr. Loreta Ave with GI and had an upper endoscopy on September 13, 2010 which only showed hiatal hernia. Colonoscopy is reported as negative. She is on a PPI. She has stopped smoking. Recently seen by primary care for sinusitis and for bronchitis. She told me at her visit 07/24/11 that her legs began swelling when she increased her dose of Lipitor to 40 mg per day.  She said that both of her legs were swollen equally. The swelling has resolved off of Lipitor but she still has pain in both legs, worse on the right. No change with ambulation. No history of PAD. Normal LE arterial dopplers in 2011. No history of documented DVT per pt. No recent chest pains. Breathing was getting back to normal. I arranged LE dopplers which did not show any arterial disease, ABI are normal. She has been started on Lasix 20 mg po Qdaily and this has improved her edema. No chest pains.    Primary Care Physician: Norins  Last Lipid Profile:  Lipid Panel     Component Value Date/Time   CHOL 196 07/09/2011 0917   TRIG 124.0 07/09/2011 0917   HDL 57.40 07/09/2011 0917   CHOLHDL 3 07/09/2011 0917   VLDL 24.8 07/09/2011 0917   LDLCALC 114* 07/09/2011 0917      Past Medical History  Diagnosis Date  . DVT  (deep venous thrombosis) 04  . Peripheral vascular disease, unspecified   . Esophageal reflux   . Mitral valve insufficiency and aortic valve insufficiency   . Immune thrombocytopenic purpura   . Unspecified disease of pericardium   . Chronic ischemic heart disease, unspecified     w/ stents in mid circumflex and mid RCA in 03, and balloon angioplasty of Diagonal branch 10-03, last cath Sept 08- 2 vessel disease w/ patent stents  . Lupus erythematosus   . Hypothyroidism     Past Surgical History  Procedure Date  . Plastic surgical repair      dog bite to leg  . Splenectomy 5-04    Current Outpatient Prescriptions  Medication Sig Dispense Refill  . aspirin 325 MG tablet Take 325 mg by mouth daily.        Marland Kitchen atenolol (TENORMIN) 25 MG tablet TAKE 1 TABLET BY MOUTH EVERY DAY  30 tablet  11  . B Complex Vitamins (VITAMIN B COMPLEX PO) Take by mouth daily.      . furosemide (LASIX) 20 MG tablet Take 1 tablet (20 mg total) by mouth daily.  30 tablet  3  . levothyroxine (SYNTHROID, LEVOTHROID) 50 MCG tablet TAKE 1 TABLET (50 MCG TOTAL) BY MOUTH DAILY.  30 tablet  5  . nitroGLYCERIN (  NITROSTAT) 0.4 MG SL tablet Place 0.4 mg under the tongue every 5 (five) minutes as needed.        . pantoprazole (PROTONIX) 40 MG tablet TAKE 1 TABLET BY MOUTH EVERY DAY  30 tablet  5  . ramipril (ALTACE) 1.25 MG capsule Take 1 capsule (1.25 mg total) by mouth daily.  30 capsule  1    Allergies  Allergen Reactions  . Ace Inhibitors     REACTION: cough    History   Social History  . Marital Status: Single    Spouse Name: N/A    Number of Children: N/A  . Years of Education: N/A   Occupational History  . Not on file.   Social History Main Topics  . Smoking status: Former Smoker -- 0.5 packs/day for 30 years    Types: Cigarettes  . Smokeless tobacco: Never Used   Comment: 31 days quit!!  . Alcohol Use: No  . Drug Use: No  . Sexually Active: Not on file   Other Topics Concern  . Not on file     Social History Narrative   HSG,  graduated from [employer] in [place]. In college [name] -grad '13 with relgious studies. [employer] - Fall '13 for MA-divinity. Marrried '73 - 2 years/ divorced. 2 son- '73, '75 - CP. 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     Family History  Problem Relation Age of Onset  . Cancer Mother     breast and cervical  . Paget's disease of bone Mother   . Fibromyalgia Mother   . Cancer Paternal Grandfather     colon  . Heart attack Other   . Coronary artery disease Other   . Diabetes Other   . Hypertension Other   . Hyperlipidemia Other   . Hypertension Father   . Heart disease Father     PVD  . GER disease Father     Review of Systems:  As stated in the HPI and otherwise negative.   BP 120/72  Pulse 68  Ht 5\' 7"  (1.702 m)  Wt 232 lb (105.235 kg)  BMI 36.34 kg/m2  Physical Examination: General: Well developed, well nourished, NAD HEENT: OP clear, mucus membranes moist SKIN: warm, dry. No rashes. Neuro: No focal deficits Musculoskeletal: Muscle strength 5/5 all ext Psychiatric: Mood and affect normal Neck: No JVD, no carotid bruits, no thyromegaly, no lymphadenopathy. Lungs:Clear bilaterally, no wheezes, rhonci, crackles Cardiovascular: Regular rate and rhythm. No murmurs, gallops or rubs. Abdomen:Soft. Bowel sounds present. Non-tender.  Extremities: No lower extremity edema. Pulses are 2 + in the bilateral DP/PT.  Lower ext arterial dopplers: ABI 1.1 bilaterally

## 2011-08-22 NOTE — Assessment & Plan Note (Signed)
Resolved on Lasix. Continue Lasix. No evidence of arterial disease. Dependent edema.

## 2011-08-25 ENCOUNTER — Other Ambulatory Visit: Payer: Self-pay | Admitting: Cardiovascular Disease

## 2011-08-25 NOTE — Telephone Encounter (Signed)
..   Requested Prescriptions   Pending Prescriptions Disp Refills  . ramipril (ALTACE) 1.25 MG capsule [Pharmacy Med Name: RAMIPRIL 1.25 MG CAPSULE] 30 capsule 6    Sig: TAKE 1 CAPSULE BY MOUTH DAILY

## 2011-09-08 ENCOUNTER — Telehealth: Payer: Self-pay | Admitting: *Deleted

## 2011-09-08 ENCOUNTER — Encounter: Payer: Self-pay | Admitting: Internal Medicine

## 2011-09-08 ENCOUNTER — Other Ambulatory Visit (INDEPENDENT_AMBULATORY_CARE_PROVIDER_SITE_OTHER): Payer: Medicare Other

## 2011-09-08 ENCOUNTER — Ambulatory Visit (INDEPENDENT_AMBULATORY_CARE_PROVIDER_SITE_OTHER): Payer: Medicare Other | Admitting: Internal Medicine

## 2011-09-08 VITALS — BP 100/70 | HR 80 | Temp 98.7°F | Resp 16 | Ht 67.0 in | Wt 227.0 lb

## 2011-09-08 DIAGNOSIS — R809 Proteinuria, unspecified: Secondary | ICD-10-CM | POA: Diagnosis not present

## 2011-09-08 DIAGNOSIS — E785 Hyperlipidemia, unspecified: Secondary | ICD-10-CM | POA: Diagnosis not present

## 2011-09-08 DIAGNOSIS — Z1239 Encounter for other screening for malignant neoplasm of breast: Secondary | ICD-10-CM

## 2011-09-08 DIAGNOSIS — R350 Frequency of micturition: Secondary | ICD-10-CM | POA: Diagnosis not present

## 2011-09-08 LAB — URINALYSIS, ROUTINE W REFLEX MICROSCOPIC
Bilirubin Urine: NEGATIVE
Hgb urine dipstick: NEGATIVE
Ketones, ur: NEGATIVE
Leukocytes, UA: NEGATIVE
Nitrite: NEGATIVE
Specific Gravity, Urine: >=1.03 (ref 1.000–1.030)
Total Protein, Urine: >=300
Urine Glucose: NEGATIVE
Urobilinogen, UA: 0.2 (ref 0.0–1.0)
pH: 6 (ref 5.0–8.0)

## 2011-09-08 NOTE — Telephone Encounter (Signed)
Called patient to notify of need to go to lab for container for collecting a 24 hour urine test. Patient states she will go in the morning. Tuesday

## 2011-09-09 ENCOUNTER — Other Ambulatory Visit: Payer: Self-pay | Admitting: Urology

## 2011-09-09 NOTE — Assessment & Plan Note (Signed)
Lab Results  Component Value Date   CHOL 196 07/09/2011   HDL 57.40 07/09/2011   LDLCALC 114* 07/09/2011   TRIG 124.0 07/09/2011   CHOLHDL 3 07/09/2011   Last lab reflects good control while on "statin" therapy. Having failed multiple "statins" except Crestor will file Prior Approval for coverage of continued use of Crestor.

## 2011-09-09 NOTE — Assessment & Plan Note (Signed)
Patient with h/o lupus erythematosis  Has recently notice very frothy urine and peripheral edema. Her creatinine and calculated GFR have remained normal. Urinalysis was positive for protein. Her symptoms are c/w lupus nephritis with proteinuria and perhaps nephrotic syndrome.  Plan Renal u/s  24 hour urine for protein and creatinine clearance  Consult with nephrology - may need needle biopsy for diagosis  Continue lasix

## 2011-09-09 NOTE — Progress Notes (Signed)
Subjective:    Patient ID: [name], female    DOB: 08/17/1955, 56 y.o.   MRN: [id number]  HPI Ms. Herendeen was recently seen by Dr. Sherrilyn Rist for peripheral edema and frothy urine. She was concerned for nephrotic syndrome. Lasix was started with good diuresis that followed of 5 lb weight loss and resolution of peripheral edema. She did have a spot urine checked for protein revealing >300 mg range protein. She remains concerned about Lupus related kidney disease.  Ms.Neto has a history of CAD. She has been treated with "statins" for hypercholesterolemia. She did well with Crestor but it was not covered by her insurance. She has tried several other "statins," including pravastatin, lovastatin, simvastatin and most recently atorvastatin,  but developed intolerable side effects. Currently she is not taking any medication for her lipids.  Past Medical History  Diagnosis Date  . DVT (deep venous thrombosis) 04  . Peripheral vascular disease, unspecified   . Esophageal reflux   . Mitral valve insufficiency and aortic valve insufficiency   . Immune thrombocytopenic purpura   . Unspecified disease of pericardium   . Chronic ischemic heart disease, unspecified     w/ stents in mid circumflex and mid RCA in 03, and balloon angioplasty of Diagonal branch 10-03, last cath Sept 08- 2 vessel disease w/ patent stents  . Lupus erythematosus   . Hypothyroidism    Past Surgical History  Procedure Date  . Plastic surgical repair      dog bite to leg  . Splenectomy 5-04   Family History  Problem Relation Age of Onset  . Cancer Mother     breast and cervical  . Paget's disease of bone Mother   . Fibromyalgia Mother   . Cancer Paternal Grandfather     colon  . Heart attack Other   . Coronary artery disease Other   . Diabetes Other   . Hypertension Other   . Hyperlipidemia Other   . Hypertension Father   . Heart disease Father     PVD  . GER disease Father    History   Social History  .  Marital Status: Single    Spouse Name: N/A    Number of Children: N/A  . Years of Education: N/A   Occupational History  . Not on file.   Social History Main Topics  . Smoking status: Former Smoker -- 0.5 packs/day for 30 years    Types: Cigarettes  . Smokeless tobacco: Never Used   Comment: 31 days quit!!  . Alcohol Use: No  . Drug Use: No  . Sexually Active: Not on file   Other Topics Concern  . Not on file   Social History Narrative   HSG,  graduated from [employer] in [place]. In college [name] -grad '13 with relgious studies. [employer] - Fall '13 for MA-divinity. Marrried '73 - 2 years/ divorced. 2 son- '73, '75 - CP. 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     Current Outpatient Prescriptions on File Prior to Visit  Medication Sig Dispense Refill  . aspirin 325 MG tablet Take 325 mg by mouth daily.        Marland Kitchen atenolol (TENORMIN) 25 MG tablet TAKE 1 TABLET BY MOUTH EVERY DAY  30 tablet  11  . B Complex Vitamins (VITAMIN B COMPLEX PO) Take by mouth daily.      . furosemide (LASIX) 20  MG tablet Take 1 tablet (20 mg total) by mouth daily.  30 tablet  3  . levothyroxine (SYNTHROID, LEVOTHROID) 50 MCG tablet TAKE 1 TABLET (50 MCG TOTAL) BY MOUTH DAILY.  30 tablet  5  . nitroGLYCERIN (NITROSTAT) 0.4 MG SL tablet Place 0.4 mg under the tongue every 5 (five) minutes as needed.        . pantoprazole (PROTONIX) 40 MG tablet TAKE 1 TABLET BY MOUTH EVERY DAY  30 tablet  5  . ramipril (ALTACE) 1.25 MG capsule Take 1 capsule (1.25 mg total) by mouth daily.  30 capsule  1  . ramipril (ALTACE) 1.25 MG capsule TAKE 1 CAPSULE BY MOUTH DAILY  30 capsule  6         Review of Systems System review is negative for any constitutional, cardiac, pulmonary, GI or neuro symptoms or complaints other than as described in the HPI.     Objective:   Physical Exam Filed Vitals:   09/08/11  1444  BP: 100/70  Pulse: 80  Temp: 98.7 F (37.1 C)  Resp: 16   Wt Readings from Last 3 Encounters:  09/08/11 227 lb (102.967 kg)  08/22/11 232 lb (105.235 kg)  08/19/11 231 lb (104.781 kg)   Gen'l- overweight AA woman in no distress HEENT- C&S clear Cor- RRR, no peripheral edema, 2+ DP pulse Pulm - normal respirations Abd - tender to percussion over right flank, tender to palpation right abdomen Neuro - A&O x 3, CN II-XII normal, gait normal        Assessment & Plan:  UTI - patient with flank pain, urinary frequency and urgency, small volumes and tenderness on exam.  Plan - U/A w/ micro - treat if positive.  Addendum: U/A negative

## 2011-09-10 ENCOUNTER — Other Ambulatory Visit: Payer: Medicare Other

## 2011-09-10 DIAGNOSIS — R809 Proteinuria, unspecified: Secondary | ICD-10-CM | POA: Diagnosis not present

## 2011-09-11 LAB — CREATININE CLEARANCE, URINE, 24 HOUR
Creatinine, 24H Ur: 1456 mg/d (ref 700–1800)
Creatinine, Urine: 72.8 mg/dL

## 2011-09-11 LAB — PROTEIN, URINE, 24 HOUR
Protein, 24H Urine: 3180 mg/d — ABNORMAL HIGH (ref 50–100)
Protein, Urine: 159 mg/dL

## 2011-09-12 ENCOUNTER — Ambulatory Visit
Admission: RE | Admit: 2011-09-12 | Discharge: 2011-09-12 | Disposition: A | Discharge status: Home / Self Care | Payer: Medicare Other | Source: Ambulatory Visit | Attending: Internal Medicine | Admitting: Internal Medicine

## 2011-09-12 DIAGNOSIS — R809 Proteinuria, unspecified: Secondary | ICD-10-CM | POA: Diagnosis not present

## 2011-09-14 ENCOUNTER — Encounter: Payer: Self-pay | Admitting: Internal Medicine

## 2011-09-15 ENCOUNTER — Telehealth: Payer: Self-pay | Admitting: Internal Medicine

## 2011-09-15 NOTE — Telephone Encounter (Signed)
Pt advised of lab results.  

## 2011-09-15 NOTE — Telephone Encounter (Signed)
ODESSER TOURANGEAU @ [phone] Caller: Gaile/Patient; PCP: Illene Regulus; CB#: [phone]; ; ; Call regarding Follow Up;   Khamryn states she was seen in office on 09/08/11. Calling for results of 24hr urine, blood work and renal ultrasound. States a Statin was supposed to be called in to [facility]. Pharmacy has not received prescription thus far.

## 2011-09-17 ENCOUNTER — Telehealth: Payer: Self-pay | Admitting: Internal Medicine

## 2011-09-17 NOTE — Telephone Encounter (Signed)
Caller: [name]/Patient is calling today 09/17/11 regarding was in office recently and saw Dr. Debby Bud.  MD prescribed Crestor.  Pt said her insurance required prior approval.  York Spaniel she talked with [name] in office on Monday 09/15/11 she told her that [name] was working on this.  Pt calling back b/c she still has not heard anything about the Crestor.  OFFICE, PLEASE CALL PT BACK AT [phone] TO LET HER KNOW IF THIS HAS BEEN TAKEN CARE OF.

## 2011-09-18 NOTE — Telephone Encounter (Signed)
Patient notified of Rx for Crestor at [facility] pharmacy [facility]

## 2011-09-23 ENCOUNTER — Ambulatory Visit
Admission: RE | Admit: 2011-09-23 | Discharge: 2011-09-23 | Disposition: A | Discharge status: Home / Self Care | Payer: Medicare Other | Source: Ambulatory Visit | Attending: Internal Medicine | Admitting: Internal Medicine

## 2011-09-23 ENCOUNTER — Ambulatory Visit: Admission: RE | Admit: 2011-09-23 | Payer: Medicare Other | Source: Ambulatory Visit

## 2011-09-23 ENCOUNTER — Other Ambulatory Visit: Payer: Self-pay | Admitting: Internal Medicine

## 2011-09-23 DIAGNOSIS — Z1231 Encounter for screening mammogram for malignant neoplasm of breast: Secondary | ICD-10-CM

## 2011-10-06 DIAGNOSIS — M329 Systemic lupus erythematosus, unspecified: Secondary | ICD-10-CM | POA: Diagnosis not present

## 2011-10-09 ENCOUNTER — Other Ambulatory Visit (HOSPITAL_COMMUNITY): Payer: Self-pay | Admitting: Nephrology

## 2011-10-09 ENCOUNTER — Encounter (HOSPITAL_COMMUNITY): Payer: Self-pay | Admitting: Pharmacist

## 2011-10-09 DIAGNOSIS — R809 Proteinuria, unspecified: Secondary | ICD-10-CM

## 2011-10-10 ENCOUNTER — Other Ambulatory Visit: Payer: Self-pay | Admitting: Radiology

## 2011-10-14 ENCOUNTER — Encounter (HOSPITAL_COMMUNITY): Payer: Self-pay

## 2011-10-14 ENCOUNTER — Ambulatory Visit (HOSPITAL_COMMUNITY)
Admission: RE | Admit: 2011-10-14 | Discharge: 2011-10-14 | Disposition: A | Discharge status: Home / Self Care | Payer: Medicare Other | Source: Ambulatory Visit | Attending: Nephrology | Admitting: Nephrology

## 2011-10-14 DIAGNOSIS — R809 Proteinuria, unspecified: Secondary | ICD-10-CM

## 2011-10-14 DIAGNOSIS — M329 Systemic lupus erythematosus, unspecified: Secondary | ICD-10-CM | POA: Diagnosis not present

## 2011-10-14 DIAGNOSIS — N289 Disorder of kidney and ureter, unspecified: Secondary | ICD-10-CM | POA: Diagnosis not present

## 2011-10-14 LAB — PROTIME-INR
INR: 1 (ref 0.00–1.49)
Prothrombin Time: 13.4 seconds (ref 11.6–15.2)

## 2011-10-14 LAB — CBC
HCT: 35.6 % — ABNORMAL LOW (ref 36.0–46.0)
Hemoglobin: 12.9 g/dL (ref 12.0–15.0)
MCH: 34.4 pg — ABNORMAL HIGH (ref 26.0–34.0)
MCHC: 36.2 g/dL — ABNORMAL HIGH (ref 30.0–36.0)
MCV: 94.9 fL (ref 78.0–100.0)
Platelets: 319 10*3/uL (ref 150–400)
RBC: 3.75 MIL/uL — ABNORMAL LOW (ref 3.87–5.11)
RDW: 14.7 % (ref 11.5–15.5)
WBC: 4.6 10*3/uL (ref 4.0–10.5)

## 2011-10-14 LAB — APTT: aPTT: 29 seconds (ref 24–37)

## 2011-10-14 MED ORDER — FENTANYL CITRATE 0.05 MG/ML IJ SOLN
INTRAMUSCULAR | Status: AC
  Filled 2011-10-14: qty 4

## 2011-10-14 MED ORDER — FENTANYL CITRATE 0.05 MG/ML IJ SOLN
INTRAMUSCULAR | Status: AC | PRN
  Administered 2011-10-14: 50 ug via INTRAVENOUS

## 2011-10-14 MED ORDER — MIDAZOLAM HCL 5 MG/5ML IJ SOLN
INTRAMUSCULAR | Status: AC | PRN
  Administered 2011-10-14: 1 mg via INTRAVENOUS

## 2011-10-14 MED ORDER — HYDROCODONE-ACETAMINOPHEN 5-325 MG PO TABS: 1.0000 | ORAL_TABLET | ORAL | Status: DC | PRN

## 2011-10-14 MED ORDER — MIDAZOLAM HCL 2 MG/2ML IJ SOLN
INTRAMUSCULAR | Status: AC
  Filled 2011-10-14: qty 4

## 2011-10-14 MED ORDER — SODIUM CHLORIDE 0.9 % IV SOLN
Freq: Once | INTRAVENOUS | Status: AC
  Administered 2011-10-14: 1000 mL via INTRAVENOUS

## 2011-10-14 NOTE — H&P (Signed)
[name] is an 56 y.o. female.   Chief Complaint: proteinuria of uncertain etiology.  HPI: Patient seen by nephrology for proteinuria - request has been made for random renal core biopsy.   Past Medical History  Diagnosis Date  . DVT (deep venous thrombosis) 04  . Peripheral vascular disease, unspecified   . Esophageal reflux   . Mitral valve insufficiency and aortic valve insufficiency   . Immune thrombocytopenic purpura   . Unspecified disease of pericardium   . Chronic ischemic heart disease, unspecified     w/ stents in mid circumflex and mid RCA in 03, and balloon angioplasty of Diagonal branch 10-03, last cath Sept 08- 2 vessel disease w/ patent stents  . Lupus erythematosus   . Hypothyroidism     Past Surgical History  Procedure Date  . Plastic surgical repair      dog bite to leg  . Splenectomy 5-04  . Cholecystectomy, laparoscopic 2010    Family History  Problem Relation Age of Onset  . Cancer Mother     breast and cervical  . Paget's disease of bone Mother   . Fibromyalgia Mother   . Cancer Paternal Grandfather     colon  . Heart attack Other   . Coronary artery disease Other   . Diabetes Other   . Hypertension Other   . Hyperlipidemia Other   . Hypertension Father   . Heart disease Father     PVD  . GER disease Father    Social History:  reports that she has quit smoking. Her smoking use included Cigarettes. She has a 15 pack-year smoking history. She has never used smokeless tobacco. She reports that she does not drink alcohol or use illicit drugs.  Allergies: No Known Allergies   Results for orders placed during the hospital encounter of 10/14/11 (from the past 48 hour(s))  CBC     Status: Abnormal   Collection Time   10/14/11  8:15 AM      Component Value Range Comment   WBC 4.6  4.0 - 10.5 K/uL    RBC 3.75 (*) 3.87 - 5.11 MIL/uL    Hemoglobin 12.9  12.0 - 15.0 g/dL    HCT 16.1 (*) 09.6 - 46.0 %    MCV 94.9  78.0 - 100.0 fL    MCH 34.4  (*) 26.0 - 34.0 pg    MCHC 36.2 (*) 30.0 - 36.0 g/dL    RDW 04.5  40.9 - 81.1 %    Platelets 319  150 - 400 K/uL PLATELET COUNT CONFIRMED BY SMEAR  PROTIME-INR     Status: Normal   Collection Time   10/14/11  8:46 AM      Component Value Range Comment   Prothrombin Time 13.4  11.6 - 15.2 seconds    INR 1.00  0.00 - 1.49   APTT     Status: Normal   Collection Time   10/14/11  8:46 AM      Component Value Range Comment   aPTT 29  24 - 37 seconds     Review of Systems  Constitutional: Negative for fever and chills.  HENT: Negative.   Cardiovascular: Negative for chest pain and palpitations.  Gastrointestinal: Positive for heartburn. Negative for abdominal pain.  Genitourinary: Negative for flank pain.  Musculoskeletal: Positive for myalgias.  Neurological: Negative for dizziness, focal weakness, seizures and loss of consciousness.  Endo/Heme/Allergies: Bruises/bleeds easily.  Psychiatric/Behavioral: Negative for depression and memory loss. The patient does not have  insomnia.     Blood pressure 117/81, pulse 64, temperature 98.6 F (37 C), temperature source Oral, resp. rate 18, height 5\' 7"  (1.702 m), weight 210 lb (95.255 kg), SpO2 96.00%. Physical Exam  Constitutional: She is oriented to person, place, and time. She appears well-developed and well-nourished. No distress.  HENT:  Head: Normocephalic and atraumatic.  Cardiovascular: Normal rate, regular rhythm and normal heart sounds.  Exam reveals no gallop and no friction rub.   No murmur heard. Respiratory: Effort normal and breath sounds normal. No respiratory distress. She has no wheezes. She has no rales.  GI: Soft. Bowel sounds are normal.  Musculoskeletal: Normal range of motion. She exhibits no edema.  Neurological: She is alert and oriented to person, place, and time.  Skin: Skin is warm and dry.  Psychiatric: She has a normal mood and affect. Her behavior is normal. Judgment and thought content normal.      Assessment/Plan Talked with patient in detail regarding procedure details. Risks including but not limited to infection, bleeding, organ damage, hematoma, complications with moderate sedation and worsening renal function all discussed with the patient's apparent understanding. Labs reviewed and are WNL to proceed. Written consent obtained.   Xzaiver Vayda D 10/14/2011, 10:14 AM

## 2011-10-14 NOTE — Procedures (Signed)
Interventional Radiology Procedure Note  Procedure: US guided random core biopsy of right kidney Complications: No immediate Recommendations: - Bedrest x 4 hrs, then advance to ambulation - Clear liquids - Hydrocodone PRN pain  Signed,  Sterling Big, MD Vascular & Interventional Radiologist [facility] Radiology

## 2011-10-31 DIAGNOSIS — M329 Systemic lupus erythematosus, unspecified: Secondary | ICD-10-CM | POA: Diagnosis not present

## 2011-11-24 ENCOUNTER — Other Ambulatory Visit: Payer: Self-pay | Admitting: Cardiovascular Disease

## 2011-12-05 DIAGNOSIS — H5200 Hypermetropia, unspecified eye: Secondary | ICD-10-CM | POA: Diagnosis not present

## 2011-12-05 DIAGNOSIS — H524 Presbyopia: Secondary | ICD-10-CM | POA: Diagnosis not present

## 2011-12-05 DIAGNOSIS — H1820 Unspecified corneal edema: Secondary | ICD-10-CM | POA: Diagnosis not present

## 2011-12-05 DIAGNOSIS — H52229 Regular astigmatism, unspecified eye: Secondary | ICD-10-CM | POA: Diagnosis not present

## 2011-12-09 ENCOUNTER — Other Ambulatory Visit: Payer: Self-pay | Admitting: Endocrinology

## 2011-12-12 DIAGNOSIS — D649 Anemia, unspecified: Secondary | ICD-10-CM | POA: Diagnosis not present

## 2011-12-12 DIAGNOSIS — M329 Systemic lupus erythematosus, unspecified: Secondary | ICD-10-CM | POA: Diagnosis not present

## 2011-12-13 ENCOUNTER — Other Ambulatory Visit: Payer: Self-pay | Admitting: Endocrinology

## 2011-12-26 DIAGNOSIS — H52229 Regular astigmatism, unspecified eye: Secondary | ICD-10-CM | POA: Diagnosis not present

## 2011-12-26 DIAGNOSIS — H04129 Dry eye syndrome of unspecified lacrimal gland: Secondary | ICD-10-CM | POA: Diagnosis not present

## 2011-12-26 DIAGNOSIS — H5200 Hypermetropia, unspecified eye: Secondary | ICD-10-CM | POA: Diagnosis not present

## 2011-12-26 DIAGNOSIS — H524 Presbyopia: Secondary | ICD-10-CM | POA: Diagnosis not present

## 2012-01-08 DIAGNOSIS — Z23 Encounter for immunization: Secondary | ICD-10-CM | POA: Diagnosis not present

## 2012-01-20 ENCOUNTER — Encounter: Payer: Self-pay | Admitting: Internal Medicine

## 2012-01-20 ENCOUNTER — Telehealth: Payer: Self-pay | Admitting: Internal Medicine

## 2012-01-20 ENCOUNTER — Ambulatory Visit (INDEPENDENT_AMBULATORY_CARE_PROVIDER_SITE_OTHER): Payer: Medicare Other | Admitting: Internal Medicine

## 2012-01-20 VITALS — BP 130/88 | HR 67 | Temp 98.2°F | Resp 16 | Wt 241.0 lb

## 2012-01-20 DIAGNOSIS — J0100 Acute maxillary sinusitis, unspecified: Secondary | ICD-10-CM | POA: Diagnosis not present

## 2012-01-20 MED ORDER — AMOXICILLIN-POT CLAVULANATE 875-125 MG PO TABS: 1.0000 | ORAL_TABLET | Freq: Two times a day (BID) | ORAL | Status: DC

## 2012-01-20 NOTE — Patient Instructions (Addendum)
Sinusitis - mostly left maxillary.   Plan Augmentin twice a day until gone  Sudafed 30 mg three times a day  Hydrate  Vitamin C 1500 mg daily  Ecchinacea as directed  REST

## 2012-01-20 NOTE — Progress Notes (Signed)
Subjective:    Patient ID: [name], female    DOB: 1955/06/14, 56 y.o.   MRN: [id number]  HPI Mrs. Ashline has had lupus glomerulonephropathy and has been seen by Dr. Hyman Hopes. She had renal biopsy for confirmation. She is being referred to Dr. Zenovia Jordan for management.  She has had 3 days of sinus pressure, facial pain, initial fever but now normal. She has had nose bleeds. She has had rhinorrhea. Headache is moderate.   Past Medical History  Diagnosis Date  . DVT (deep venous thrombosis) 04  . Peripheral vascular disease, unspecified   . Esophageal reflux   . Mitral valve insufficiency and aortic valve insufficiency   . Immune thrombocytopenic purpura   . Unspecified disease of pericardium   . Chronic ischemic heart disease, unspecified     w/ stents in mid circumflex and mid RCA in 03, and balloon angioplasty of Diagonal branch 10-03, last cath Sept 08- 2 vessel disease w/ patent stents  . Lupus erythematosus   . Hypothyroidism    Past Surgical History  Procedure Date  . Plastic surgical repair      dog bite to leg  . Splenectomy 5-04  . Cholecystectomy, laparoscopic 2010   Family History  Problem Relation Age of Onset  . Cancer Mother     breast and cervical  . Paget's disease of bone Mother   . Fibromyalgia Mother   . Cancer Paternal Grandfather     colon  . Heart attack Other   . Coronary artery disease Other   . Diabetes Other   . Hypertension Other   . Hyperlipidemia Other   . Hypertension Father   . Heart disease Father     PVD  . GER disease Father    History   Social History  . Marital Status: Single    Spouse Name: N/A    Number of Children: N/A  . Years of Education: N/A   Occupational History  . Not on file.   Social History Main Topics  . Smoking status: Former Smoker -- 0.5 packs/day for 30 years    Types: Cigarettes  . Smokeless tobacco: Never Used   Comment: 31 days quit!!  . Alcohol Use: No  . Drug Use: No  . Sexually Active:  Not on file   Other Topics Concern  . Not on file   Social History Narrative   HSG,  graduated from [employer] in [place]. In college [name] -grad '13 with relgious studies. [employer] - Fall '13 for MA-divinity. Marrried '73 - 2 years/ divorced. 2 son- '73, '75 - CP. 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     Current Outpatient Prescriptions on File Prior to Visit  Medication Sig Dispense Refill  . aspirin 81 MG chewable tablet Chew 81 mg by mouth daily.      Marland Kitchen atenolol (TENORMIN) 25 MG tablet Take 25 mg by mouth daily.      . B Complex Vitamins (VITAMIN B COMPLEX PO) Take 1 tablet by mouth daily.       . furosemide (LASIX) 20 MG tablet TAKE 1 TABLET (20 MG TOTAL) BY MOUTH DAILY.  30 tablet  4  . levothyroxine (SYNTHROID, LEVOTHROID) 50 MCG tablet Take 50 mcg by mouth daily before breakfast.      . nitroGLYCERIN (NITROSTAT) 0.4 MG SL tablet Place 0.4 mg under the tongue every 5 (five) minutes x  3 doses as needed. For chest pain      . pantoprazole (PROTONIX) 40 MG tablet TAKE 1 TABLET BY MOUTH EVERY DAY  30 tablet  5  . ramipril (ALTACE) 1.25 MG capsule Take 1.25 mg by mouth daily.      . rosuvastatin (CRESTOR) 10 MG tablet Take 10 mg by mouth at bedtime.      . pantoprazole (PROTONIX) 40 MG tablet Take 40 mg by mouth daily.            Review of Systems System review is negative for any constitutional, cardiac, pulmonary, GI or neuro symptoms or complaints other than as described in the HPI.     Objective:   Physical Exam Filed Vitals:   01/20/12 1150  BP: 130/88  Pulse: 67  Temp: 98.2 F (36.8 C)  Resp: 16   Wt Readings from Last 3 Encounters:  01/20/12 241 lb (109.317 kg)  10/14/11 210 lb (95.255 kg)  09/08/11 227 lb (102.967 kg)   Gen'l - overweight AA woman in no acute distress HEENT- TMs normal, throat clear. Very tender to percussion over maxillary sinus  left. Chest clear Cor- RRR       Assessment & Plan:  Sinusitis - mostly left maxillary.   Plan Augmentin twice a day until gone  Sudafed 30 mg three times a day  Hydrate  Vitamin C 1500 mg daily  Ecchinacea as directed  REST

## 2012-01-20 NOTE — Telephone Encounter (Signed)
Caller: Darrien/Patient; PCP: Illene Regulus (Adults only); CB#: [phone]; Call regarding Sinusitis;  Pt calling stating she has had labored breathing for 2 days and her nose bled "all night long" and has a "massive headache". Has taken 2 Aspirin. Afebrile. Pt states she did not want to speak to a nurse she only wants an appointment. Triage offered and advised with c/o "labored breathing";pt declined. Pt did not sound to be in respiratory distress. Appointment made with Dr. Debby Bud for 1100 per pt insistance.

## 2012-01-23 DIAGNOSIS — R5381 Other malaise: Secondary | ICD-10-CM | POA: Diagnosis not present

## 2012-01-23 DIAGNOSIS — R609 Edema, unspecified: Secondary | ICD-10-CM | POA: Diagnosis not present

## 2012-01-23 DIAGNOSIS — M329 Systemic lupus erythematosus, unspecified: Secondary | ICD-10-CM | POA: Diagnosis not present

## 2012-01-23 DIAGNOSIS — R5383 Other fatigue: Secondary | ICD-10-CM | POA: Diagnosis not present

## 2012-01-23 DIAGNOSIS — M2550 Pain in unspecified joint: Secondary | ICD-10-CM | POA: Diagnosis not present

## 2012-01-26 ENCOUNTER — Ambulatory Visit: Payer: Medicare Other | Admitting: Internal Medicine

## 2012-01-27 ENCOUNTER — Other Ambulatory Visit: Payer: Self-pay | Admitting: Endocrinology

## 2012-01-27 ENCOUNTER — Other Ambulatory Visit: Payer: Self-pay | Admitting: Internal Medicine

## 2012-02-09 DIAGNOSIS — M329 Systemic lupus erythematosus, unspecified: Secondary | ICD-10-CM | POA: Diagnosis not present

## 2012-02-16 DIAGNOSIS — M329 Systemic lupus erythematosus, unspecified: Secondary | ICD-10-CM | POA: Diagnosis not present

## 2012-02-17 ENCOUNTER — Encounter (HOSPITAL_COMMUNITY): Payer: Self-pay

## 2012-02-17 ENCOUNTER — Observation Stay (HOSPITAL_COMMUNITY): Payer: Medicare Other

## 2012-02-17 ENCOUNTER — Encounter: Payer: Self-pay | Admitting: Internal Medicine

## 2012-02-17 ENCOUNTER — Ambulatory Visit (INDEPENDENT_AMBULATORY_CARE_PROVIDER_SITE_OTHER): Payer: Medicare Other | Admitting: Internal Medicine

## 2012-02-17 ENCOUNTER — Observation Stay (HOSPITAL_COMMUNITY)
Admission: AD | Admit: 2012-02-17 | Discharge: 2012-02-19 | Disposition: A | Discharge status: Home / Self Care | Payer: Medicare Other | Source: Ambulatory Visit | Attending: Internal Medicine | Admitting: Internal Medicine

## 2012-02-17 VITALS — BP 118/84 | HR 78 | Temp 98.9°F | Ht 67.0 in

## 2012-02-17 DIAGNOSIS — E8779 Other fluid overload: Secondary | ICD-10-CM

## 2012-02-17 DIAGNOSIS — I259 Chronic ischemic heart disease, unspecified: Secondary | ICD-10-CM

## 2012-02-17 DIAGNOSIS — N390 Urinary tract infection, site not specified: Secondary | ICD-10-CM | POA: Diagnosis not present

## 2012-02-17 DIAGNOSIS — M3214 Glomerular disease in systemic lupus erythematosus: Secondary | ICD-10-CM

## 2012-02-17 DIAGNOSIS — M329 Systemic lupus erythematosus, unspecified: Secondary | ICD-10-CM

## 2012-02-17 DIAGNOSIS — N058 Unspecified nephritic syndrome with other morphologic changes: Secondary | ICD-10-CM | POA: Insufficient documentation

## 2012-02-17 DIAGNOSIS — R0989 Other specified symptoms and signs involving the circulatory and respiratory systems: Secondary | ICD-10-CM | POA: Diagnosis not present

## 2012-02-17 DIAGNOSIS — E039 Hypothyroidism, unspecified: Secondary | ICD-10-CM | POA: Insufficient documentation

## 2012-02-17 DIAGNOSIS — Z86718 Personal history of other venous thrombosis and embolism: Secondary | ICD-10-CM | POA: Insufficient documentation

## 2012-02-17 DIAGNOSIS — R300 Dysuria: Secondary | ICD-10-CM | POA: Diagnosis not present

## 2012-02-17 DIAGNOSIS — K219 Gastro-esophageal reflux disease without esophagitis: Secondary | ICD-10-CM | POA: Diagnosis not present

## 2012-02-17 DIAGNOSIS — E8770 Fluid overload, unspecified: Secondary | ICD-10-CM

## 2012-02-17 DIAGNOSIS — R0609 Other forms of dyspnea: Secondary | ICD-10-CM | POA: Diagnosis not present

## 2012-02-17 DIAGNOSIS — Z7982 Long term (current) use of aspirin: Secondary | ICD-10-CM | POA: Insufficient documentation

## 2012-02-17 DIAGNOSIS — D729 Disorder of white blood cells, unspecified: Secondary | ICD-10-CM

## 2012-02-17 DIAGNOSIS — I059 Rheumatic mitral valve disease, unspecified: Secondary | ICD-10-CM | POA: Insufficient documentation

## 2012-02-17 DIAGNOSIS — L930 Discoid lupus erythematosus: Secondary | ICD-10-CM | POA: Diagnosis not present

## 2012-02-17 DIAGNOSIS — Z79899 Other long term (current) drug therapy: Secondary | ICD-10-CM | POA: Diagnosis not present

## 2012-02-17 DIAGNOSIS — E785 Hyperlipidemia, unspecified: Secondary | ICD-10-CM

## 2012-02-17 DIAGNOSIS — R0600 Dyspnea, unspecified: Secondary | ICD-10-CM

## 2012-02-17 DIAGNOSIS — R609 Edema, unspecified: Secondary | ICD-10-CM

## 2012-02-17 DIAGNOSIS — D7289 Other specified disorders of white blood cells: Secondary | ICD-10-CM | POA: Diagnosis not present

## 2012-02-17 LAB — URINALYSIS, ROUTINE W REFLEX MICROSCOPIC
Bilirubin Urine: NEGATIVE
Glucose, UA: NEGATIVE mg/dL
Ketones, ur: NEGATIVE mg/dL
Leukocytes, UA: NEGATIVE
Nitrite: NEGATIVE
Protein, ur: >300 mg/dL — AB
Specific Gravity, Urine: 1.031 — ABNORMAL HIGH (ref 1.005–1.030)
Urobilinogen, UA: 0.2 mg/dL (ref 0.0–1.0)
pH: 7 (ref 5.0–8.0)

## 2012-02-17 LAB — CBC WITH DIFFERENTIAL/PLATELET
Basophils Absolute: 0 10*3/uL (ref 0.0–0.1)
Basophils Relative: 0 % (ref 0–1)
Eosinophils Absolute: 0 10*3/uL (ref 0.0–0.7)
Eosinophils Relative: 0 % (ref 0–5)
HCT: 34.7 % — ABNORMAL LOW (ref 36.0–46.0)
Hemoglobin: 11.8 g/dL — ABNORMAL LOW (ref 12.0–15.0)
Lymphocytes Relative: 3 % — ABNORMAL LOW (ref 12–46)
Lymphs Abs: 0.2 10*3/uL — ABNORMAL LOW (ref 0.7–4.0)
MCH: 31.9 pg (ref 26.0–34.0)
MCHC: 34 g/dL (ref 30.0–36.0)
MCV: 93.8 fL (ref 78.0–100.0)
Monocytes Absolute: 0.1 10*3/uL (ref 0.1–1.0)
Monocytes Relative: 2 % — ABNORMAL LOW (ref 3–12)
Neutro Abs: 5.7 10*3/uL (ref 1.7–7.7)
Neutrophils Relative %: 95 % — ABNORMAL HIGH (ref 43–77)
Platelets: 148 10*3/uL — ABNORMAL LOW (ref 150–400)
RBC: 3.7 MIL/uL — ABNORMAL LOW (ref 3.87–5.11)
RDW: 16.8 % — ABNORMAL HIGH (ref 11.5–15.5)
WBC: 6 10*3/uL (ref 4.0–10.5)

## 2012-02-17 LAB — URINE MICROSCOPIC-ADD ON

## 2012-02-17 LAB — COMPREHENSIVE METABOLIC PANEL
ALT: 27 U/L (ref 0–35)
AST: 22 U/L (ref 0–37)
Albumin: 1.5 g/dL — ABNORMAL LOW (ref 3.5–5.2)
Alkaline Phosphatase: 67 U/L (ref 39–117)
BUN: 36 mg/dL — ABNORMAL HIGH (ref 6–23)
CO2: 25 mEq/L (ref 19–32)
Calcium: 8.5 mg/dL (ref 8.4–10.5)
Chloride: 103 mEq/L (ref 96–112)
Creatinine, Ser: 1.12 mg/dL — ABNORMAL HIGH (ref 0.50–1.10)
GFR calc Af Amer: 62 mL/min — ABNORMAL LOW (ref >90)
GFR calc non Af Amer: 54 mL/min — ABNORMAL LOW (ref >90)
Glucose, Bld: 97 mg/dL (ref 70–99)
Potassium: 4.5 mEq/L (ref 3.5–5.1)
Sodium: 135 mEq/L (ref 135–145)
Total Bilirubin: 0.2 mg/dL — ABNORMAL LOW (ref 0.3–1.2)
Total Protein: 4.7 g/dL — ABNORMAL LOW (ref 6.0–8.3)

## 2012-02-17 LAB — MAGNESIUM: Magnesium: 2.4 mg/dL (ref 1.5–2.5)

## 2012-02-17 LAB — PRO B NATRIURETIC PEPTIDE: Pro B Natriuretic peptide (BNP): 2825 pg/mL — ABNORMAL HIGH (ref 0–125)

## 2012-02-17 LAB — TSH: TSH: 0.8 u[IU]/mL (ref 0.350–4.500)

## 2012-02-17 MED ORDER — ATENOLOL 25 MG PO TABS
25.0000 mg | ORAL_TABLET | Freq: Every day | ORAL | Status: DC
  Administered 2012-02-18 – 2012-02-19 (×2): 25 mg via ORAL
  Filled 2012-02-17 (×2): qty 1

## 2012-02-17 MED ORDER — ONDANSETRON HCL 4 MG PO TABS: 4.0000 mg | ORAL_TABLET | Freq: Four times a day (QID) | ORAL | Status: DC | PRN

## 2012-02-17 MED ORDER — SENNA 8.6 MG PO TABS
1.0000 | ORAL_TABLET | Freq: Two times a day (BID) | ORAL | Status: DC
  Administered 2012-02-17 – 2012-02-19 (×4): 8.6 mg via ORAL
  Filled 2012-02-17 (×4): qty 1

## 2012-02-17 MED ORDER — ACETAMINOPHEN 650 MG RE SUPP: 650.0000 mg | Freq: Four times a day (QID) | RECTAL | Status: DC | PRN

## 2012-02-17 MED ORDER — ENOXAPARIN SODIUM 40 MG/0.4ML ~~LOC~~ SOLN
40.0000 mg | SUBCUTANEOUS | Status: DC
  Filled 2012-02-17: qty 0.4

## 2012-02-17 MED ORDER — ENOXAPARIN SODIUM 60 MG/0.6ML ~~LOC~~ SOLN
60.0000 mg | SUBCUTANEOUS | Status: DC
  Administered 2012-02-17 – 2012-02-18 (×2): 60 mg via SUBCUTANEOUS
  Filled 2012-02-17 (×3): qty 0.6

## 2012-02-17 MED ORDER — NITROGLYCERIN 0.4 MG SL SUBL: 0.4000 mg | SUBLINGUAL_TABLET | SUBLINGUAL | Status: DC | PRN

## 2012-02-17 MED ORDER — ATORVASTATIN CALCIUM 40 MG PO TABS
40.0000 mg | ORAL_TABLET | Freq: Every day | ORAL | Status: DC
  Filled 2012-02-17 (×3): qty 1

## 2012-02-17 MED ORDER — ONDANSETRON HCL 4 MG/2ML IJ SOLN: 4.0000 mg | Freq: Four times a day (QID) | INTRAMUSCULAR | Status: DC | PRN

## 2012-02-17 MED ORDER — LEVOFLOXACIN 750 MG PO TABS
750.0000 mg | ORAL_TABLET | Freq: Every day | ORAL | Status: DC
  Administered 2012-02-17 – 2012-02-19 (×3): 750 mg via ORAL
  Filled 2012-02-17 (×3): qty 1

## 2012-02-17 MED ORDER — MAGNESIUM HYDROXIDE 400 MG/5ML PO SUSP: 30.0000 mL | Freq: Every day | ORAL | Status: DC | PRN

## 2012-02-17 MED ORDER — RAMIPRIL 1.25 MG PO CAPS
1.2500 mg | ORAL_CAPSULE | Freq: Every day | ORAL | Status: DC
  Administered 2012-02-18 – 2012-02-19 (×2): 1.25 mg via ORAL
  Filled 2012-02-17 (×2): qty 1

## 2012-02-17 MED ORDER — SODIUM CHLORIDE 0.9 % IJ SOLN: 3.0000 mL | INTRAMUSCULAR | Status: DC | PRN

## 2012-02-17 MED ORDER — CALCIUM CARBONATE ANTACID 500 MG PO CHEW
200.0000 mg | CHEWABLE_TABLET | Freq: Four times a day (QID) | ORAL | Status: DC | PRN
  Filled 2012-02-17: qty 1

## 2012-02-17 MED ORDER — ASPIRIN 81 MG PO CHEW
81.0000 mg | CHEWABLE_TABLET | Freq: Every day | ORAL | Status: DC
  Administered 2012-02-18 – 2012-02-19 (×2): 81 mg via ORAL
  Filled 2012-02-17 (×2): qty 1

## 2012-02-17 MED ORDER — MYCOPHENOLATE MOFETIL 250 MG PO CAPS
750.0000 mg | ORAL_CAPSULE | Freq: Two times a day (BID) | ORAL | Status: DC
  Administered 2012-02-17 – 2012-02-19 (×4): 750 mg via ORAL
  Filled 2012-02-17 (×5): qty 3

## 2012-02-17 MED ORDER — PANTOPRAZOLE SODIUM 40 MG PO TBEC
40.0000 mg | DELAYED_RELEASE_TABLET | Freq: Every day | ORAL | Status: DC
  Administered 2012-02-18 – 2012-02-19 (×2): 40 mg via ORAL
  Filled 2012-02-17 (×2): qty 1

## 2012-02-17 MED ORDER — SODIUM CHLORIDE 0.9 % IJ SOLN
3.0000 mL | Freq: Two times a day (BID) | INTRAMUSCULAR | Status: DC
  Administered 2012-02-17 – 2012-02-18 (×2): 3 mL via INTRAVENOUS

## 2012-02-17 MED ORDER — FUROSEMIDE 10 MG/ML IJ SOLN
40.0000 mg | Freq: Four times a day (QID) | INTRAMUSCULAR | Status: DC
  Administered 2012-02-17 – 2012-02-19 (×8): 40 mg via INTRAVENOUS
  Filled 2012-02-17 (×11): qty 4

## 2012-02-17 MED ORDER — SODIUM CHLORIDE 0.9 % IV SOLN: 250.0000 mL | INTRAVENOUS | Status: DC | PRN

## 2012-02-17 MED ORDER — ACETAMINOPHEN 325 MG PO TABS: 650.0000 mg | ORAL_TABLET | Freq: Four times a day (QID) | ORAL | Status: DC | PRN

## 2012-02-17 MED ORDER — LEVOTHYROXINE SODIUM 50 MCG PO TABS
50.0000 ug | ORAL_TABLET | Freq: Every day | ORAL | Status: DC
  Administered 2012-02-18 – 2012-02-19 (×2): 50 ug via ORAL
  Filled 2012-02-17 (×3): qty 1

## 2012-02-17 MED ORDER — PREDNISONE 20 MG PO TABS
20.0000 mg | ORAL_TABLET | Freq: Three times a day (TID) | ORAL | Status: DC
  Administered 2012-02-17 – 2012-02-19 (×6): 20 mg via ORAL
  Filled 2012-02-17 (×8): qty 1

## 2012-02-17 NOTE — Patient Instructions (Signed)
It was good to see you today. Will admit to [facility] for evaluation and treatment

## 2012-02-17 NOTE — H&P (Signed)
[name] is an 56 y.o. female.   Chief Complaint: dysuria, SOB HPI: Here per rheum to see PCP -  ?infection per labs done yesterday (91% neutrophils with bands)  complains of dysuria  Onset 3 days ago, progressively worse  Not associated with hematuria or flank pain, also vaginal itch/irritation but no discharge  Similar to prior UTI symptoms - last UTI approx 6 months ago  Also "wheeze" x 1 week - not associated with cough, chest pain or chest congestion  complains of increasing edema: face and BLE, not improved with low dose lasix  Overwhelming fatigue, "i just feel so bad"   Past Medical History  Diagnosis Date  . DVT (deep venous thrombosis) 2004  . Peripheral vascular disease, unspecified   . Esophageal reflux   . Mitral valve insufficiency and aortic valve insufficiency   . Immune thrombocytopenic purpura   . Unspecified disease of pericardium   . Chronic ischemic heart disease, unspecified     w/ stents in mid circumflex and mid RCA in 03, and balloon angioplasty of Diagonal branch 10-03, last cath Sept 08- 2 vessel disease w/ patent stents  . Lupus erythematosus   . Hypothyroidism     Past Surgical History  Procedure Date  . Plastic surgical repair      dog bite to leg  . Splenectomy 5-04  . Cholecystectomy, laparoscopic 2010    Family History  Problem Relation Age of Onset  . Cancer Mother     breast and cervical  . Paget's disease of bone Mother   . Fibromyalgia Mother   . Cancer Paternal Grandfather     colon  . Heart attack Other   . Coronary artery disease Other   . Diabetes Other   . Hypertension Other   . Hyperlipidemia Other   . Hypertension Father   . Heart disease Father     PVD  . GER disease Father    Social History:  reports that she quit smoking about 8 weeks ago. Her smoking use included Cigarettes. She has a 15 pack-year smoking history. She has never used smokeless tobacco. She reports that she does not drink alcohol or use illicit  drugs. HSG, graduated from [employer] in [place]. In college [name] -grad '13 with relgious studies. [employer] - Fall '13 for MA-divinity. Marrried '73 - 2 years/ divorced. 2 son- '73, '75 - CP. 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    Allergies: No Known Allergies  No current facility-administered medications on file prior to encounter.   Current Outpatient Prescriptions on File Prior to Encounter  Medication Sig Dispense Refill  . aspirin 81 MG chewable tablet Chew 81 mg by mouth daily.      Marland Kitchen atenolol (TENORMIN) 25 MG tablet Take 25 mg by mouth daily.      . B Complex Vitamins (VITAMIN B COMPLEX PO) Take 1 tablet by mouth daily.       . furosemide (LASIX) 20 MG tablet TAKE 1 TABLET (20 MG TOTAL) BY MOUTH DAILY.  30 tablet  3  . levothyroxine (SYNTHROID, LEVOTHROID) 50 MCG tablet TAKE 1 TABLET (50 MCG TOTAL) BY MOUTH DAILY.  30 tablet  10  . mycophenolate (CELLCEPT) 250 MG capsule Take 750 mg by mouth 2 (two) times daily.      . nitroGLYCERIN (NITROSTAT) 0.4 MG SL tablet Place 0.4 mg under the tongue every 5 (five) minutes  x 3 doses as needed. For chest pain      . pantoprazole (PROTONIX) 40 MG tablet TAKE 1 TABLET BY MOUTH EVERY DAY  30 tablet  5  . predniSONE (DELTASONE) 20 MG tablet Take 20 mg by mouth 3 (three) times daily. Take 2 tablets in the morning and 1 tablet in the evening      . ramipril (ALTACE) 1.25 MG capsule Take 1.25 mg by mouth daily.      . rosuvastatin (CRESTOR) 10 MG tablet Take 10 mg by mouth at bedtime.      . [DISCONTINUED] furosemide (LASIX) 20 MG tablet TAKE 1 TABLET (20 MG TOTAL) BY MOUTH DAILY.  30 tablet  4  . [DISCONTINUED] levothyroxine (SYNTHROID, LEVOTHROID) 50 MCG tablet Take 50 mcg by mouth daily before breakfast.      . [DISCONTINUED] pantoprazole (PROTONIX) 40 MG tablet Take 40 mg by mouth daily.            Review of Systems    Constitutional: Positive for chills. Negative for fever.  HENT: Negative for hearing loss, nosebleeds, congestion and sore throat.   Eyes: Negative.   Respiratory: Positive for cough, shortness of breath and wheezing. Negative for hemoptysis and sputum production.   Cardiovascular: Negative.  Negative for chest pain.  Gastrointestinal: Positive for nausea. Negative for heartburn, abdominal pain and constipation.  Genitourinary: Negative.   Musculoskeletal: Positive for myalgias, back pain and joint pain.  Skin: Negative.   Neurological: Positive for weakness. Negative for headaches.  Endo/Heme/Allergies: Negative for environmental allergies and polydipsia. Bruises/bleeds easily.  Psychiatric/Behavioral: Negative for depression and substance abuse. The patient is not nervous/anxious.      Physical Exam  BP 118/84  Pulse 78  Temp 98.9 F (37.2 C) (Oral)  Ht 5\' 7"  (1.702 m)  SpO2 97%  Wt Readings from Last 3 Encounters:   01/20/12  241 lb (109.317 kg)   10/14/11  210 lb (95.255 kg)   09/08/11  227 lb (102.967 kg)    Constitutional: She is fatigued and emotional but nontoxic - cushingoid with facial and periorbital edema  HENT: Head: Normocephalic and atraumatic. Sinus non tender Ears: B TMs ok, no erythema or effusion; Nose: Nose normal. Mouth/Throat: Oropharynx is clear and moist. No oropharyngeal exudate.  Eyes: Conjunctivae and EOM are normal. Pupils are equal, round, and reactive to light. No scleral icterus.  Neck: Thick. Normal range of motion. Neck supple. No JVD present. No thyromegaly present.  Cardiovascular: Normal rate, regular rhythm and normal heart sounds. No murmur heard. 2+ tight BLE edema to mid thighs.  Pulmonary/Chest: Effort normal and breath sounds diminished at bases. No respiratory distress. She has no wheezes at present.  Abdominal: Soft. Bowel sounds are normal. She exhibits no distension. There is no tenderness. no masses Musculoskeletal: Normal range of  motion, no joint effusions. No gross deformities Neurological: She is alert and oriented to person, place, and time. No cranial nerve deficit. Coordination normal.  Skin: Skin is warm and dry. No rash noted. No erythema.  Psychiatric: She has tearful and dysphoric mood/affect. Her behavior is normal. Judgment and thought content normal.   Assessment/Plan 1. Fluid overload - patient with a h/o cardiomyopathy presents with marked weight gain, swelling about the face and eyes, respiratory complaints and wheezing all c/w fluid overload in need of IV diuretics. Last  2 D echo Nov '10: Study Conclusions  - Left ventricle: The cavity size was normal. Wall thickness was normal. Systolic function was normal. The estimated ejection  fraction was in the range of 60% to 65%. Doppler parameters are consistent with abnormal left ventricular relaxation (grade 1 diastolic dysfunction). - Mitral valve: Mitral valve is mildly thickened, calcified. Mild to moderate regurgitation. - Tricuspid valve: Mild-moderate regurgitation.  Plan Med/surg admission  IV lasix 40 mg q6  Daily weights and I&Os  Continue home meds  2. ID - patient with immunosuppression on cellcept c/o dysuria.  Plan Lab: repeat CBCD, U/a with reflex to micro and culture  Levaquin 750 mg daily  3. Rheum - Patient with lupus on medical therapy  Plan  Continue cellcept  4. Renal - h/o lupus nephritis, followed by Dr. Hyman Hopes. She has been doing relatively well  Illene Regulus (O) [phone] (c) [phone]  Call grp: Patsi Sears IM  [phone] 02/17/2012, 1:08 PM

## 2012-02-17 NOTE — Progress Notes (Signed)
Subjective:    Patient ID: [name], female    DOB: [birth year], 56 y.o.   MRN: [id number]  HPI  Here per rheum to see PCP -  ?infection per labs done yesterday (91% neutrophils with bands)  complains of dysuria  Onset 3 days ago, progressively worse Not associated with hematuria or flank pain, also vaginal itch/irritation but no discharge Similar to prior UTI symptoms - last UTI approx 6 months ago Also "wheeze" x 1 week - not associated with cough, chest pain or chest congestion complains of increasing edema: face and BLE, not improved with low dose lasix Overwhelming fatigue, "i just feel so bad"  Past Medical History  Diagnosis Date  . DVT (deep venous thrombosis) 2004  . Peripheral vascular disease, unspecified   . Esophageal reflux   . Mitral valve insufficiency and aortic valve insufficiency   . Immune thrombocytopenic purpura   . Unspecified disease of pericardium   . Chronic ischemic heart disease, unspecified     w/ stents in mid circumflex and mid RCA in 03, and balloon angioplasty of Diagonal branch 10-03, last cath Sept 08- 2 vessel disease w/ patent stents  . Lupus erythematosus   . Hypothyroidism    Family History  Problem Relation Age of Onset  . Cancer Mother     breast and cervical  . Paget's disease of bone Mother   . Fibromyalgia Mother   . Cancer Paternal Grandfather     colon  . Heart attack Other   . Coronary artery disease Other   . Diabetes Other   . Hypertension Other   . Hyperlipidemia Other   . Hypertension Father   . Heart disease Father     PVD  . GER disease Father    History  Substance Use Topics  . Smoking status: Former Smoker -- 0.5 packs/day for 30 years    Types: Cigarettes    Quit date: 12/23/2011  . Smokeless tobacco: Never Used  . Alcohol Use: No    Review of Systems  Constitutional: Positive for fatigue and unexpected weight change. Negative for fever, chills, diaphoresis and appetite change.  HENT: Negative  for ear pain, nosebleeds, congestion, sore throat, mouth sores, trouble swallowing, neck pain, neck stiffness, dental problem, sinus pressure and ear discharge.   Respiratory: Positive for wheezing. Negative for cough and shortness of breath.   Cardiovascular: Positive for leg swelling. Negative for chest pain and palpitations.  Gastrointestinal: Negative for nausea, vomiting, abdominal pain, diarrhea, constipation and blood in stool.  Genitourinary: Positive for dysuria, urgency and frequency. Negative for hematuria, flank pain, decreased urine volume, vaginal bleeding, vaginal discharge, difficulty urinating and pelvic pain.  Musculoskeletal: Positive for myalgias. Negative for joint swelling.  Neurological: Positive for weakness. Negative for dizziness, seizures, speech difficulty, light-headedness, numbness and headaches.  Hematological: Negative for adenopathy.  Psychiatric/Behavioral: Positive for dysphoric mood. Negative for suicidal ideas, sleep disturbance, self-injury and decreased concentration. The patient is not nervous/anxious.        Objective:   Physical Exam BP 118/84  Pulse 78  Temp 98.9 F (37.2 C) (Oral)  Ht 5\' 7"  (1.702 m)  SpO2 97% Wt Readings from Last 3 Encounters:  01/20/12 241 lb (109.317 kg)  10/14/11 210 lb (95.255 kg)  09/08/11 227 lb (102.967 kg)   Constitutional: She is fatigued and emotional but nontoxic - cushingoid with facial and periorbital edema  HENT: Head: Normocephalic and atraumatic. Sinus non tender Ears: B TMs ok, no erythema or effusion; Nose: Nose normal.  Mouth/Throat: Oropharynx is clear and moist. No oropharyngeal exudate.  Eyes: Conjunctivae and EOM are normal. Pupils are equal, round, and reactive to light. No scleral icterus.  Neck: Thick. Normal range of motion. Neck supple. No JVD present. No thyromegaly present.  Cardiovascular: Normal rate, regular rhythm and normal heart sounds.  No murmur heard. 2+ tight BLE edema to mid  thighs. Pulmonary/Chest: Effort normal and breath sounds diminished at bases. No respiratory distress. She has no wheezes at present.  Abdominal: Soft. Bowel sounds are normal. She exhibits no distension. There is no tenderness. no masses Musculoskeletal: Normal range of motion, no joint effusions. No gross deformities Neurological: She is alert and oriented to person, place, and time. No cranial nerve deficit. Coordination normal.  Skin: Skin is warm and dry. No rash noted. No erythema.  Psychiatric: She has tearful and dysphoric mood/affect. Her behavior is normal. Judgment and thought content normal.   Lab Results  Component Value Date   WBC 4.6 10/14/2011   HGB 12.9 10/14/2011   HCT 35.6* 10/14/2011   PLT 319 10/14/2011   GLUCOSE 85 08/19/2011   CHOL 196 07/09/2011   TRIG 124.0 07/09/2011   HDL 57.40 07/09/2011   LDLCALC 114* 07/09/2011   ALT 18 07/09/2011   AST 19 07/09/2011   NA 142 08/19/2011   K 4.4 08/19/2011   CL 110 08/19/2011   CREATININE TEST NOT PERFORMED 09/10/2011   BUN 15 08/19/2011   CO2 25 08/19/2011   TSH 0.63 04/22/2011   INR 1.00 10/14/2011   Labs from [place] yesterday: WBC 8.4, plt 119; 91% neut with 7.7 ANC, bands present      Assessment & Plan:   neutrophilia - left shift on CBC yesterday Dysuria, clinically concerning for UTI Immunosuppression (high dose pred and cellcept) for tx SLE nephritis x 6 weeks Marked volume overload - ?pulmonary edema with cardiac wheeze  CXR, UA Start empiric antibiotics for presumed UTI Admit to observation for IV diuresis and further ID evaluation if/as needed IP follow up with renal Hyman Hopes) and reschedule OP ROV (sceduled for tomorrow 11/27 830am)  Discussed with PCP MEN who agrees as above -  he will arrange orders and see pt during hospitalization

## 2012-02-18 DIAGNOSIS — R609 Edema, unspecified: Secondary | ICD-10-CM

## 2012-02-18 DIAGNOSIS — I059 Rheumatic mitral valve disease, unspecified: Secondary | ICD-10-CM

## 2012-02-18 DIAGNOSIS — I259 Chronic ischemic heart disease, unspecified: Secondary | ICD-10-CM | POA: Diagnosis not present

## 2012-02-18 DIAGNOSIS — E8779 Other fluid overload: Secondary | ICD-10-CM | POA: Diagnosis not present

## 2012-02-18 LAB — URINE CULTURE: Colony Count: 50000

## 2012-02-18 LAB — BASIC METABOLIC PANEL
BUN: 44 mg/dL — ABNORMAL HIGH (ref 6–23)
CO2: 24 mEq/L (ref 19–32)
Calcium: 8.3 mg/dL — ABNORMAL LOW (ref 8.4–10.5)
Chloride: 103 mEq/L (ref 96–112)
Creatinine, Ser: 1.37 mg/dL — ABNORMAL HIGH (ref 0.50–1.10)
GFR calc Af Amer: 49 mL/min — ABNORMAL LOW (ref >90)
GFR calc non Af Amer: 42 mL/min — ABNORMAL LOW (ref >90)
Glucose, Bld: 129 mg/dL — ABNORMAL HIGH (ref 70–99)
Potassium: 4.6 mEq/L (ref 3.5–5.1)
Sodium: 134 mEq/L — ABNORMAL LOW (ref 135–145)

## 2012-02-18 NOTE — Progress Notes (Signed)
*  PRELIMINARY RESULTS* Echocardiogram 2D Echocardiogram has been performed.  [name] 02/18/2012, 2:33 PM

## 2012-02-18 NOTE — Progress Notes (Signed)
*  PRELIMINARY RESULTS* Echocardiogram 2D Echocardiogram has been performed.  Jeryl Columbia 02/18/2012, 2:34 PM

## 2012-02-18 NOTE — Progress Notes (Signed)
Subjective: Feels better. She reports her weight is down 9 lbs. No respiratory distress  Objective: Lab: Lab Results  Component Value Date   WBC 6.0 02/17/2012   HGB 11.8* 02/17/2012   HCT 34.7* 02/17/2012   MCV 93.8 02/17/2012   PLT 148* 02/17/2012   BMET    Component Value Date/Time   NA 134* 02/18/2012 0515   K 4.6 02/18/2012 0515   CL 103 02/18/2012 0515   CO2 24 02/18/2012 0515   GLUCOSE 129* 02/18/2012 0515   BUN 44* 02/18/2012 0515   CREATININE 1.37* 02/18/2012 0515   CREATININE TEST NOT PERFORMED 09/10/2011 1150   CALCIUM 8.3* 02/18/2012 0515   GFRNONAA 42* 02/18/2012 0515   GFRAA 49* 02/18/2012 0515   U/A couldy, sp gr 1.031 0-2 WBC hpf  pBNP 2825  Imaging: CXR 11/26: IMPRESSION:  Very mild pulmonary vascular congestion without overt edema.  Otherwise, no acute cardiopulmonary process.   Scheduled Meds:   . aspirin  81 mg Oral Daily  . atenolol  25 mg Oral Daily  . atorvastatin  40 mg Oral q1800  . enoxaparin (LOVENOX) injection  60 mg Subcutaneous Q24H  . furosemide  40 mg Intravenous Q6H  . levofloxacin  750 mg Oral Daily  . levothyroxine  50 mcg Oral QAC breakfast  . mycophenolate  750 mg Oral BID  . pantoprazole  40 mg Oral Daily  . predniSONE  20 mg Oral TID  . ramipril  1.25 mg Oral Daily  . senna  1 tablet Oral BID  . sodium chloride  3 mL Intravenous Q12H  . [DISCONTINUED] enoxaparin (LOVENOX) injection  40 mg Subcutaneous Q24H   Continuous Infusions:  PRN Meds:.sodium chloride, acetaminophen, acetaminophen, calcium carbonate, magnesium hydroxide, nitroGLYCERIN, ondansetron (ZOFRAN) IV, ondansetron, sodium chloride   Physical Exam: Filed Vitals:   02/18/12 1210  BP: 126/86  Pulse:   Temp:   Resp:     Intake/Output Summary (Last 24 hours) at 02/18/12 1242 Last data filed at 02/17/12 1702  Gross per 24 hour  Intake      0 ml  Output    250 ml  Net   -250 ml   Wt - 264 to 259  Gen'l - WNWD AA woman in no distress HEENT- C&S  clear Cor- RRR Pulm - good breath sounds, no rales       Assessment/Plan: 1. Volume overload - mild chf on CXR, BNP elevated, troponin negative - c/w mild CHF  Plan 2 D echo pending  Continue IV lasix  2. U/A - urinalysis normal. WBC normal  Plan  Will complete 3 days of levaquin   [employer] (o) [phone]; (c) [phone] Call-grp - [facility]  Tele: [phone]  02/18/2012, 12:39 PM

## 2012-02-19 DIAGNOSIS — E8779 Other fluid overload: Secondary | ICD-10-CM | POA: Diagnosis not present

## 2012-02-19 DIAGNOSIS — R609 Edema, unspecified: Secondary | ICD-10-CM | POA: Diagnosis not present

## 2012-02-19 DIAGNOSIS — L930 Discoid lupus erythematosus: Secondary | ICD-10-CM | POA: Diagnosis not present

## 2012-02-19 MED ORDER — FUROSEMIDE 20 MG PO TABS: 40.0000 mg | ORAL_TABLET | Freq: Two times a day (BID) | ORAL | Status: DC

## 2012-02-19 MED ORDER — ROSUVASTATIN CALCIUM 10 MG PO TABS: 10.0000 mg | ORAL_TABLET | Freq: Every day | ORAL | Status: DC

## 2012-02-19 NOTE — Progress Notes (Signed)
Discharge instructions given to pt, verbalized understanding. Left the unit in stable condition. 

## 2012-02-19 NOTE — Discharge Summary (Signed)
NAMEKASEN, SAKO               ACCOUNT NO.:  [id number]  MEDICAL RECORD NO.:  [id number]  LOCATION:                                 FACILITY:  PHYSICIAN:  Rosalyn Gess. Berea Majkowski, MD  DATE OF BIRTH:  Sep 12, 1955  DATE OF ADMISSION:  02/17/2012 DATE OF DISCHARGE:  02/19/2012                              DISCHARGE SUMMARY   ADMITTING DIAGNOSES: 1. Shortness of breath with fluid overload. 2. Possible urinary tract infection. 3. Abnormal CBC as an outpatient with 91%  neutrophils with bands.  DISCHARGE DIAGNOSES: 1. Fluid overload. 2. Question of urinary tract infection.  CONSULTANTS:  None.  PROCEDURES:  Two-view chest x-ray done February 17, 2012,  which showed very mild pulmonary vascular congestion without overt edema.  Next, 2D echo which showed a hyperdynamic ventricle with EF of 65-70%, no notation of diastolic dysfunction.  HISTORY OF PRESENT ILLNESS:  Ms. Magloire is a pleasant 56 year old, obese,  African American woman who was followed for lupus erythematosus and lupus nephritis.  She is followed by Dr. Nickola Major for rheumatology. The patient has been taking prednisone and CellCept.  She reports that over the past 7-10 days she has had significant weight gain and increasing shortness of breath.  The patient also complains of dysuria at the time of admission and has a history of frequent UTIs.  Because of her symptoms and her being somewhat immunosuppressed and history of a mild cardiomyopathy she is admitted for inpatient care.  Please see the H and P for past medical history, family history, social history, and admission exam.  HOSPITAL COURSE: 1. Fluid overload.  The patient was admitted to a non-telemetry unit.     Chest x-ray is noted.  Lab work returned with an elevated BNP at     2825, up from her recent 5 on Aug 19, 2011,  The patient was     started on IV Lasix 40 mg q.6.  She diuresed a total of 6.2 L for a     loss of 12 pounds.  Her respiratory symptoms  resolved and she had     no increased work of breathing.  With the patient having had an     adequate diuresis at this point, she is ready for discharge to     home.  We will continue on an increased dose of p.o. Lasix until     seen in the office for followup. 2. ID.  The patient with question of a urinary tract infection.  CBC     at admission to the hospital was 6000, had 95% segs, 3% lymphs, 2%     monos.  She had large platelets, she had Dohle bodies.  The patient     was started on Levaquin 750 mg daily and  has received a full     course of 3 doses given that her urinalysis returned as     unremarkable with only 0-2 WBCs per high-powered field.  She will     not need continued antibiotics. 3. Rheumatology.  The patient with lupus erythematosus, followed by     Dr. Nickola Major.  She is on CellCept and prednisone and she will  continue these medications at her home doses.  She does have an     appointment to see Dr. Nickola Major for February 25, 2012, and adjustments     in her regimen will be made at that time as indicated per Dr.     Nickola Major' judgment. 4. Lupus nephritis.  The patient has been followed by Dr.  Elvis Coil     for Nephrology.  She has been stable.  Her creatinine at this     admission was 1.37 up from last study November, 26, 2013, where it     was 1.12.  She will need close monitoring and given her increasing     doses of Lasix, to make sure it does not affect her renal function     significantly.  The patient will have a followup be met at her     office followup on February 25, 2012.  With the patient being stable with her respiratory symptoms being resolved, she is now ready for discharge to home.  She will continue on all of her home medications except Lasix is increased to 40 mg b.i.d. until seen in followup.  She does have potassium tablets at home.  She is instructed to take these.  The patient will keep her followup appointments with Dr. Nickola Major and she does need  to reschedule appointment with Dr.  Elvis Coil.  DISCHARGE EXAMINATION: VITAL SIGNS:  Temperature was 98.9, blood pressure 124/78, pulse was 60, respirations 20, oxygen saturations 98% on room air.  Weight 252 pounds at the time of discharge with weight for today pending. GENERAL APPEARANCE:  This is an obese African American woman in no acute distress.  HEENT:  Conjunctiva and sclerae clear.  Pupils equal, round and reactive to light and accommodation. CARDIOVASCULAR:  A 2+ radial pulse.  Her precordium is quiet.  She had a regular rate and rhythm. PULMONARY:  The patient has no increased work of breathing.  Lungs were clear to auscultation and percussion with no rales, wheezes or rhonchi. No further examination conducted.  FINAL LABORATORY:  From February 18, 2012, sodium 134, potassium 4.6, chloride 103, CO2 of 24, BUN of 44, creatinine 1.37, glucose was 129. BNP from February 17, 2012, was 2825.  CBC from February 17, 2012, with a white count of 6000, hemoglobin was 11.8 g, platelet count 148,000, differential 95% segs, 3% lymphs, 2% monos.  The patient did have a TSH on February 17, 2012, that was 0.8.  DISPOSITION:  The patient is discharged to home with close followup to be arranged.  She will be seen on Wednesday February 25, 2012.  The patient's condition at the time of discharge dictation is stable and improved.     Rosalyn Gess Jerrett Baldinger, MD     MEN/MEDQ  D:  02/19/2012  T:  02/19/2012  Job:  [id number]  cc:   Zenovia Jordan, MD Fax: [phone]  Garnetta Buddy, M.D. Fax: [phone]

## 2012-02-19 NOTE — Progress Notes (Signed)
Subjective: Feeling better but still worried about mild SOB with exertion  Objective: Lab: Lab Results  Component Value Date   WBC 6.0 02/17/2012   HGB 11.8* 02/17/2012   HCT 34.7* 02/17/2012   MCV 93.8 02/17/2012   PLT 148* 02/17/2012   BMET    Component Value Date/Time   NA 134* 02/18/2012 0515   K 4.6 02/18/2012 0515   CL 103 02/18/2012 0515   CO2 24 02/18/2012 0515   GLUCOSE 129* 02/18/2012 0515   BUN 44* 02/18/2012 0515   CREATININE 1.37* 02/18/2012 0515   CREATININE TEST NOT PERFORMED 09/10/2011 1150   CALCIUM 8.3* 02/18/2012 0515   GFRNONAA 42* 02/18/2012 0515   GFRAA 49* 02/18/2012 0515     Imaging: 2 D echo: Study Conclusions  - Left ventricle: The cavity size was normal. Wall thickness was normal. Systolic function was vigorous. The estimated ejection fraction was in the range of 65% to 70%. - Mitral valve: Mild regurgitation. - Pulmonary arteries: Systolic pressure was mildly increased. PA peak pressure: 38mm Hg (S).   Scheduled Meds:   . aspirin  81 mg Oral Daily  . atenolol  25 mg Oral Daily  . atorvastatin  40 mg Oral q1800  . enoxaparin (LOVENOX) injection  60 mg Subcutaneous Q24H  . furosemide  40 mg Intravenous Q6H  . levofloxacin  750 mg Oral Daily  . levothyroxine  50 mcg Oral QAC breakfast  . mycophenolate  750 mg Oral BID  . pantoprazole  40 mg Oral Daily  . predniSONE  20 mg Oral TID  . ramipril  1.25 mg Oral Daily  . senna  1 tablet Oral BID  . sodium chloride  3 mL Intravenous Q12H   Continuous Infusions:  PRN Meds:.sodium chloride, acetaminophen, acetaminophen, calcium carbonate, magnesium hydroxide, nitroGLYCERIN, ondansetron (ZOFRAN) IV, ondansetron, sodium chloride   Physical Exam: Filed Vitals:   02/19/12 0600  BP: 124/78  Pulse: 60  Temp: 98.9 F (37.2 C)  Resp: 20    Intake/Output Summary (Last 24 hours) at 02/19/12 0905 Last data filed at 02/19/12 0600  Gross per 24 hour  Intake    360 ml  Output   6300 ml  Net   -5940 ml   Total this Adm: -6.2 liters Weight 264 to 252       Assessment/Plan: Stable and ready for discharge. Dictated # [phone] [phone]   [facility] (o) [phone]; (c) [phone] Call-grp - [facility]  Tele: [phone]  02/19/2012, 9:04 AM

## 2012-02-22 ENCOUNTER — Other Ambulatory Visit: Payer: Self-pay | Admitting: Cardiovascular Disease

## 2012-02-25 ENCOUNTER — Encounter: Payer: Self-pay | Admitting: Internal Medicine

## 2012-02-25 ENCOUNTER — Ambulatory Visit (INDEPENDENT_AMBULATORY_CARE_PROVIDER_SITE_OTHER): Payer: Medicare Other | Admitting: Internal Medicine

## 2012-02-25 VITALS — BP 116/78 | HR 82 | Temp 97.2°F | Resp 10 | Wt 252.0 lb

## 2012-02-25 DIAGNOSIS — Z79899 Other long term (current) drug therapy: Secondary | ICD-10-CM | POA: Diagnosis not present

## 2012-02-25 DIAGNOSIS — L930 Discoid lupus erythematosus: Secondary | ICD-10-CM

## 2012-02-25 DIAGNOSIS — M3214 Glomerular disease in systemic lupus erythematosus: Secondary | ICD-10-CM

## 2012-02-25 DIAGNOSIS — N058 Unspecified nephritic syndrome with other morphologic changes: Secondary | ICD-10-CM

## 2012-02-25 DIAGNOSIS — R0602 Shortness of breath: Secondary | ICD-10-CM

## 2012-02-25 DIAGNOSIS — M2550 Pain in unspecified joint: Secondary | ICD-10-CM | POA: Diagnosis not present

## 2012-02-25 DIAGNOSIS — B372 Candidiasis of skin and nail: Secondary | ICD-10-CM | POA: Diagnosis not present

## 2012-02-25 DIAGNOSIS — M329 Systemic lupus erythematosus, unspecified: Secondary | ICD-10-CM

## 2012-02-25 DIAGNOSIS — R609 Edema, unspecified: Secondary | ICD-10-CM | POA: Diagnosis not present

## 2012-02-26 ENCOUNTER — Telehealth: Payer: Self-pay | Admitting: *Deleted

## 2012-02-26 DIAGNOSIS — M3214 Glomerular disease in systemic lupus erythematosus: Secondary | ICD-10-CM | POA: Insufficient documentation

## 2012-02-26 MED ORDER — FUROSEMIDE 40 MG PO TABS: 40.0000 mg | ORAL_TABLET | Freq: Every day | ORAL | Status: DC

## 2012-02-26 NOTE — Assessment & Plan Note (Signed)
BMET    Component Value Date/Time   NA 134* 02/18/2012 0515   K 4.6 02/18/2012 0515   CL 103 02/18/2012 0515   CO2 24 02/18/2012 0515   GLUCOSE 129* 02/18/2012 0515   BUN 44* 02/18/2012 0515   CREATININE 1.37* 02/18/2012 0515   CREATININE TEST NOT PERFORMED 09/10/2011 1150   CALCIUM 8.3* 02/18/2012 0515   GFRNONAA 42* 02/18/2012 0515   GFRAA 49* 02/18/2012 0515   Patient did have fluid overload secondary to prednisone and possible mild renal insufficiency. Diuresed well on IV lasix and at discharge was to continue oral lasix 40 mg bid.  Plan Decreased lasix to 40 mg daily  Follow up Bmet and pBNP

## 2012-02-26 NOTE — Telephone Encounter (Signed)
Called pt and told her per Dr Debby Bud to come in for follow up labs BMET and BNP. Also pt needs to reduce lasix to 40 mg once a day. Pt understood.

## 2012-02-26 NOTE — Progress Notes (Signed)
  Subjective:    Patient ID: [name], female    DOB: [birth year], 56 y.o.   MRN: [id number]  HPI [name] was hospitalized Nov 26-28 for fluid overload and question of UTI with mild left shift on CBC. She was treated with IV lasix and diuresed 6.2L (12 lbs). At admission her BNP = 2825. She was also treated with levaquin 750 mg daily x 3 days. Since discharge she has done well. She has not gained any weight, has not had facial swelling or other signs of fluid retention. She has not had dysuria, fever or chills. Follow up with Dr. Nickola Major is scheduled in regard to her use of cellcept and prednisone. Follow up with Dr. Hyman Hopes is per prior appointment scheduling.   PMH, FamHx and SocHx reviewed for any changes and relevance.  Current Outpatient Prescriptions on File Prior to Visit  Medication Sig Dispense Refill  . aspirin 81 MG chewable tablet Chew 81 mg by mouth daily.      Marland Kitchen atenolol (TENORMIN) 25 MG tablet Take 25 mg by mouth daily.      Marland Kitchen atenolol (TENORMIN) 25 MG tablet TAKE 1 TABLET BY MOUTH EVERY DAY  30 tablet  11  . furosemide (LASIX) 20 MG tablet Take 2 tablets (40 mg total) by mouth 2 (two) times daily.  60 tablet  5  . levothyroxine (SYNTHROID, LEVOTHROID) 50 MCG tablet Take 50 mcg by mouth daily.      . mycophenolate (CELLCEPT) 250 MG capsule Take 750 mg by mouth 2 (two) times daily.      . nitroGLYCERIN (NITROSTAT) 0.4 MG SL tablet Place 0.4 mg under the tongue every 5 (five) minutes x 3 doses as needed. For chest pain      . pantoprazole (PROTONIX) 40 MG tablet Take 40 mg by mouth daily.      . predniSONE (DELTASONE) 20 MG tablet Take 20 mg by mouth 3 (three) times daily. Take 2 tablets in the morning and 1 tablet in the evening      . ramipril (ALTACE) 1.25 MG capsule Take 1.25 mg by mouth daily.      . rosuvastatin (CRESTOR) 10 MG tablet Take 1 tablet (10 mg total) by mouth at bedtime.          Review of Systems System review is negative for any constitutional, cardiac,  pulmonary, GI or neuro symptoms or complaints other than as described in the HPI.     Objective:   Physical Exam Filed Vitals:   02/25/12 1116  BP: 116/78  Pulse: 82  Temp: 97.2 F (36.2 C)  Resp: 10   Wt Readings from Last 3 Encounters:  02/25/12 252 lb 0.6 oz (114.325 kg)  02/19/12 252 lb (114.306 kg)  01/20/12 241 lb (109.317 kg)   Gen'l - overweight AA woman in no distress HEENT- C&S clear Pulm - normal respirations Cor - RRR Neuro - A&O x 3       Assessment & Plan:

## 2012-02-26 NOTE — Assessment & Plan Note (Signed)
She is to continue her present regimen of cellcept and prednisone until adjusted by Dr. Nickola Major

## 2012-02-27 ENCOUNTER — Other Ambulatory Visit (INDEPENDENT_AMBULATORY_CARE_PROVIDER_SITE_OTHER): Payer: Medicare Other

## 2012-02-27 DIAGNOSIS — R0602 Shortness of breath: Secondary | ICD-10-CM

## 2012-02-27 DIAGNOSIS — M3214 Glomerular disease in systemic lupus erythematosus: Secondary | ICD-10-CM

## 2012-02-27 DIAGNOSIS — E785 Hyperlipidemia, unspecified: Secondary | ICD-10-CM

## 2012-02-27 DIAGNOSIS — M329 Systemic lupus erythematosus, unspecified: Secondary | ICD-10-CM | POA: Diagnosis not present

## 2012-02-27 DIAGNOSIS — N058 Unspecified nephritic syndrome with other morphologic changes: Secondary | ICD-10-CM | POA: Diagnosis not present

## 2012-02-27 LAB — COMPREHENSIVE METABOLIC PANEL
ALT: 37 U/L — ABNORMAL HIGH (ref 0–35)
AST: 28 U/L (ref 0–37)
Albumin: 1.8 g/dL — ABNORMAL LOW (ref 3.5–5.2)
Alkaline Phosphatase: 68 U/L (ref 39–117)
BUN: 33 mg/dL — ABNORMAL HIGH (ref 6–23)
CO2: 29 mEq/L (ref 19–32)
Calcium: 8.4 mg/dL (ref 8.4–10.5)
Chloride: 107 mEq/L (ref 96–112)
Creatinine, Ser: 1.1 mg/dL (ref 0.4–1.2)
GFR: 67.46 mL/min (ref >60.00)
Glucose, Bld: 106 mg/dL — ABNORMAL HIGH (ref 70–99)
Potassium: 5.8 mEq/L — ABNORMAL HIGH (ref 3.5–5.1)
Sodium: 142 mEq/L (ref 135–145)
Total Bilirubin: 0.5 mg/dL (ref 0.3–1.2)
Total Protein: 4.5 g/dL — ABNORMAL LOW (ref 6.0–8.3)

## 2012-02-27 LAB — LIPID PANEL
Cholesterol: 483 mg/dL — ABNORMAL HIGH (ref 0–200)
HDL: 111.2 mg/dL (ref >39.00)
Total CHOL/HDL Ratio: 4
Triglycerides: 147 mg/dL (ref 0.0–149.0)
VLDL: 29.4 mg/dL (ref 0.0–40.0)

## 2012-02-27 LAB — LDL CHOLESTEROL, DIRECT: Direct LDL: 321.8 mg/dL

## 2012-02-27 LAB — BRAIN NATRIURETIC PEPTIDE: Pro B Natriuretic peptide (BNP): 271 pg/mL — ABNORMAL HIGH (ref 0.0–100.0)

## 2012-03-04 ENCOUNTER — Ambulatory Visit (INDEPENDENT_AMBULATORY_CARE_PROVIDER_SITE_OTHER): Payer: Medicare Other | Admitting: Internal Medicine

## 2012-03-04 ENCOUNTER — Encounter: Payer: Self-pay | Admitting: Internal Medicine

## 2012-03-04 ENCOUNTER — Ambulatory Visit (INDEPENDENT_AMBULATORY_CARE_PROVIDER_SITE_OTHER)
Admission: RE | Admit: 2012-03-04 | Discharge: 2012-03-04 | Disposition: A | Discharge status: Home / Self Care | Payer: Medicare Other | Source: Ambulatory Visit | Attending: Internal Medicine | Admitting: Internal Medicine

## 2012-03-04 ENCOUNTER — Other Ambulatory Visit (INDEPENDENT_AMBULATORY_CARE_PROVIDER_SITE_OTHER): Payer: Medicare Other

## 2012-03-04 VITALS — BP 104/62 | HR 81 | Temp 98.2°F | Resp 10 | Wt 243.0 lb

## 2012-03-04 DIAGNOSIS — R188 Other ascites: Secondary | ICD-10-CM | POA: Diagnosis not present

## 2012-03-04 DIAGNOSIS — R52 Pain, unspecified: Secondary | ICD-10-CM

## 2012-03-04 DIAGNOSIS — R109 Unspecified abdominal pain: Secondary | ICD-10-CM

## 2012-03-04 LAB — URINALYSIS, ROUTINE W REFLEX MICROSCOPIC
Bilirubin Urine: NEGATIVE
Ketones, ur: NEGATIVE
Leukocytes, UA: NEGATIVE
Nitrite: NEGATIVE
Specific Gravity, Urine: 1.015 (ref 1.000–1.030)
Total Protein, Urine: 100
Urine Glucose: NEGATIVE
Urobilinogen, UA: 0.2 (ref 0.0–1.0)
pH: 7.5 (ref 5.0–8.0)

## 2012-03-04 MED ORDER — KETOROLAC TROMETHAMINE 60 MG/2ML IM SOLN
60.0000 mg | Freq: Once | INTRAMUSCULAR | Status: AC
  Administered 2012-03-04: 60 mg via INTRAMUSCULAR

## 2012-03-04 NOTE — Patient Instructions (Addendum)
Probable kidney stone.  Plan Urinalysis for blood  Shot of ketorolac for pain now and then ketorolac 10 mg every 8 hours as needed for pain  CT kidney stone protocol at 2:1`5 today at [facility] [address]  Kidney Stones Kidney stones (ureteral lithiasis) are deposits that form inside your kidneys. The intense pain is caused by the stone moving through the urinary tract. When the stone moves, the ureter goes into spasm around the stone. The stone is usually passed in the urine.   CAUSES    A disorder that makes certain neck glands produce too much parathyroid hormone (primary hyperparathyroidism).   A buildup of uric acid crystals.   Narrowing (stricture) of the ureter.   A kidney obstruction present at birth (congenital obstruction).   Previous surgery on the kidney or ureters.   Numerous kidney infections.  SYMPTOMS    Feeling sick to your stomach (nauseous).   Throwing up (vomiting).   Blood in the urine (hematuria).   Pain that usually spreads (radiates) to the groin.   Frequency or urgency of urination.  DIAGNOSIS    Taking a history and physical exam.   Blood or urine tests.   Computerized X-ray scan (CT scan).   Occasionally, an examination of the inside of the urinary bladder (cystoscopy) is performed.  TREATMENT    Observation.   Increasing your fluid intake.   Surgery may be needed if you have severe pain or persistent obstruction.  The size, location, and chemical composition are all important variables that will determine the proper choice of action for you. Talk to your caregiver to better understand your situation so that you will minimize the risk of injury to yourself and your kidney.   HOME CARE INSTRUCTIONS    Drink enough water and fluids to keep your urine clear or pale yellow.   Strain all urine through the provided strainer. Keep all particulate matter and stones for your caregiver to see. The stone causing the pain may be as  small as a grain of salt. It is very important to use the strainer each and every time you pass your urine. The collection of your stone will allow your caregiver to analyze it and verify that a stone has actually passed.   Only take over-the-counter or prescription medicines for pain, discomfort, or fever as directed by your caregiver.   Make a follow-up appointment with your caregiver as directed.   Get follow-up X-rays if required. The absence of pain does not always mean that the stone has passed. It may have only stopped moving. If the urine remains completely obstructed, it can cause loss of kidney function or even complete destruction of the kidney. It is your responsibility to make sure X-rays and follow-ups are completed. Ultrasounds of the kidney can show blockages and the status of the kidney. Ultrasounds are not associated with any radiation and can be performed easily in a matter of minutes.  SEEK IMMEDIATE MEDICAL CARE IF:    Pain cannot be controlled with the prescribed medicine.   You have a fever.   The severity or intensity of pain increases over 18 hours and is not relieved by pain medicine.   You develop a new onset of abdominal pain.   You feel faint or pass out.  MAKE SURE YOU:    Understand these instructions.   Will watch your condition.   Will get help right away if you are not doing well or get worse.  Document Released:  03/10/2005 Document Revised: 06/02/2011 Document Reviewed: 07/06/2009 [facility] Patient Information 2013 [place], [place].

## 2012-03-06 NOTE — Progress Notes (Signed)
Subjective:    Patient ID: [name], female    DOB: [birth year], 56 y.o.   MRN: [id number]  HPI [name] presents with 24-48 hr history of severe right flank pain with radiation to the RLQ. The pain is colicky in nature. She has had no fever or chills, no dysuria, no frank hematuria, no nausea or vomiting. She had called her nephrologist about the pain and was advised to be evaluated for possible nephrolithiasis.  Past Medical History  Diagnosis Date  . DVT (deep venous thrombosis) 2004  . Peripheral vascular disease, unspecified   . Esophageal reflux   . Mitral valve insufficiency and aortic valve insufficiency   . Immune thrombocytopenic purpura   . Unspecified disease of pericardium   . Chronic ischemic heart disease, unspecified     w/ stents in mid circumflex and mid RCA in 03, and balloon angioplasty of Diagonal branch 10-03, last cath Sept 08- 2 vessel disease w/ patent stents  . Lupus erythematosus   . Hypothyroidism    Past Surgical History  Procedure Date  . Plastic surgical repair      dog bite to leg  . Splenectomy 5-04  . Cholecystectomy, laparoscopic 2010   Family History  Problem Relation Age of Onset  . Cancer Mother     breast and cervical  . Paget's disease of bone Mother   . Fibromyalgia Mother   . Cancer Paternal Grandfather     colon  . Heart attack Other   . Coronary artery disease Other   . Diabetes Other   . Hypertension Other   . Hyperlipidemia Other   . Hypertension Father   . Heart disease Father     PVD  . GER disease Father    History   Social History  . Marital Status: Single    Spouse Name: N/A    Number of Children: N/A  . Years of Education: N/A   Occupational History  . Not on file.   Social History Main Topics  . Smoking status: Former Smoker -- 0.5 packs/day for 30 years    Types: Cigarettes    Quit date: 12/23/2011  . Smokeless tobacco: Never Used  . Alcohol Use: No  . Drug Use: No  . Sexually Active: No    Other Topics Concern  . Not on file   Social History Narrative   HSG,  graduated from [employer] in [place]. In college [name] -grad '13 with relgious studies. [employer] - Fall '13 for MA-divinity. Marrried '73 - 2 years/ divorced. 2 son- '73, '75 - CP. 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     Current Outpatient Prescriptions on File Prior to Visit  Medication Sig Dispense Refill  . aspirin 81 MG chewable tablet Chew 81 mg by mouth daily.      Marland Kitchen atenolol (TENORMIN) 25 MG tablet TAKE 1 TABLET BY MOUTH EVERY DAY  30 tablet  11  . furosemide (LASIX) 40 MG tablet Take 1 tablet (40 mg total) by mouth daily.  30 tablet  11  . levothyroxine (SYNTHROID, LEVOTHROID) 50 MCG tablet Take 50 mcg by mouth daily.      . mycophenolate (CELLCEPT) 250 MG capsule Take 750 mg by mouth 2 (two) times daily. 3 in the morning and 2 at night.      . nitroGLYCERIN (NITROSTAT) 0.4 MG SL tablet Place 0.4 mg under the tongue every  5 (five) minutes x 3 doses as needed. For chest pain      . pantoprazole (PROTONIX) 40 MG tablet Take 40 mg by mouth daily.      . predniSONE (DELTASONE) 20 MG tablet Take 10 mg by mouth daily. 1 tablet in the morning (10 mg)      . ramipril (ALTACE) 1.25 MG capsule Take 1.25 mg by mouth daily.      . rosuvastatin (CRESTOR) 10 MG tablet Take 1 tablet (10 mg total) by mouth at bedtime.          Review of Systems System review is negative for any constitutional, cardiac, pulmonary, GI or neuro symptoms or complaints other than as described in the HPI.     Objective:   Physical Exam Filed Vitals:   03/04/12 1150  BP: 104/62  Pulse: 81  Temp: 98.2 F (36.8 C)  Resp: 10   Wt Readings from Last 3 Encounters:  03/04/12 243 lb (110.224 kg)  02/25/12 252 lb 0.6 oz (114.325 kg)  02/19/12 252 lb (114.306 kg)   Gen'l- overweight AA woman who is visibly uncomfortable Cor-  RRR  Pulm - normal respirations, no wheezing Abd - very tender to percussion over the right flank, tender to palpation right lower abdomen.         Assessment & Plan:  Flank pain - abrupt onset of pain and nature and location of pain very suggestive of nephrolithiasis.  Plan U/A  CT abd/pelvis kidney stone protocol.  Toradol 60 mg IM for immediate pain control  Addendum:  U/A - trace lysed Hgb, 0-2 RBCs  CT abdomen kidney stone protocol - IMPRESSION:  1. Ascites, mesenteric edema and anasarca concerning for fluid  overload state.  2. No evidence for renal calculi or obstructive uropathy.  3. Prior splenectomy.  4. Cannot rule out a bilateral femoral head avascular necrosis.  Patient called with results: toradol relieved pain which has not returned.

## 2012-03-07 ENCOUNTER — Other Ambulatory Visit: Payer: Self-pay

## 2012-03-07 ENCOUNTER — Inpatient Hospital Stay (HOSPITAL_COMMUNITY)
Admission: EM | Admit: 2012-03-07 | Discharge: 2012-03-09 | DRG: 392 | Disposition: A | Discharge status: Home / Self Care | Payer: Medicare Other | Attending: Internal Medicine | Admitting: Internal Medicine

## 2012-03-07 ENCOUNTER — Encounter (HOSPITAL_COMMUNITY): Payer: Self-pay | Admitting: Emergency Medicine

## 2012-03-07 ENCOUNTER — Observation Stay (HOSPITAL_COMMUNITY): Payer: Medicare Other

## 2012-03-07 ENCOUNTER — Emergency Department (HOSPITAL_COMMUNITY): Payer: Medicare Other

## 2012-03-07 DIAGNOSIS — E782 Mixed hyperlipidemia: Secondary | ICD-10-CM | POA: Diagnosis present

## 2012-03-07 DIAGNOSIS — E785 Hyperlipidemia, unspecified: Secondary | ICD-10-CM | POA: Diagnosis not present

## 2012-03-07 DIAGNOSIS — N289 Disorder of kidney and ureter, unspecified: Secondary | ICD-10-CM | POA: Diagnosis not present

## 2012-03-07 DIAGNOSIS — R109 Unspecified abdominal pain: Secondary | ICD-10-CM | POA: Diagnosis not present

## 2012-03-07 DIAGNOSIS — L930 Discoid lupus erythematosus: Secondary | ICD-10-CM

## 2012-03-07 DIAGNOSIS — E8779 Other fluid overload: Secondary | ICD-10-CM | POA: Diagnosis present

## 2012-03-07 DIAGNOSIS — M329 Systemic lupus erythematosus, unspecified: Secondary | ICD-10-CM | POA: Diagnosis present

## 2012-03-07 DIAGNOSIS — E039 Hypothyroidism, unspecified: Secondary | ICD-10-CM | POA: Diagnosis not present

## 2012-03-07 DIAGNOSIS — M3214 Glomerular disease in systemic lupus erythematosus: Secondary | ICD-10-CM | POA: Diagnosis present

## 2012-03-07 DIAGNOSIS — R52 Pain, unspecified: Secondary | ICD-10-CM

## 2012-03-07 DIAGNOSIS — N039 Chronic nephritic syndrome with unspecified morphologic changes: Secondary | ICD-10-CM | POA: Diagnosis present

## 2012-03-07 DIAGNOSIS — I10 Essential (primary) hypertension: Secondary | ICD-10-CM | POA: Diagnosis present

## 2012-03-07 DIAGNOSIS — IMO0002 Reserved for concepts with insufficient information to code with codable children: Secondary | ICD-10-CM

## 2012-03-07 DIAGNOSIS — R1031 Right lower quadrant pain: Principal | ICD-10-CM | POA: Diagnosis present

## 2012-03-07 DIAGNOSIS — R079 Chest pain, unspecified: Secondary | ICD-10-CM | POA: Diagnosis not present

## 2012-03-07 LAB — URINE MICROSCOPIC-ADD ON

## 2012-03-07 LAB — BASIC METABOLIC PANEL
BUN: 18 mg/dL (ref 6–23)
CO2: 27 mEq/L (ref 19–32)
Calcium: 8.8 mg/dL (ref 8.4–10.5)
Chloride: 99 mEq/L (ref 96–112)
Creatinine, Ser: 1.16 mg/dL — ABNORMAL HIGH (ref 0.50–1.10)
GFR calc Af Amer: 60 mL/min — ABNORMAL LOW (ref >90)
GFR calc non Af Amer: 52 mL/min — ABNORMAL LOW (ref >90)
Glucose, Bld: 93 mg/dL (ref 70–99)
Potassium: 4.1 mEq/L (ref 3.5–5.1)
Sodium: 134 mEq/L — ABNORMAL LOW (ref 135–145)

## 2012-03-07 LAB — CBC
HCT: 33.7 % — ABNORMAL LOW (ref 36.0–46.0)
Hemoglobin: 11.3 g/dL — ABNORMAL LOW (ref 12.0–15.0)
MCH: 31.8 pg (ref 26.0–34.0)
MCHC: 33.5 g/dL (ref 30.0–36.0)
MCV: 94.9 fL (ref 78.0–100.0)
Platelets: 199 10*3/uL (ref 150–400)
RBC: 3.55 MIL/uL — ABNORMAL LOW (ref 3.87–5.11)
RDW: 16.4 % — ABNORMAL HIGH (ref 11.5–15.5)
WBC: 7.8 10*3/uL (ref 4.0–10.5)

## 2012-03-07 LAB — HEPATIC FUNCTION PANEL
ALT: 35 U/L (ref 0–35)
AST: 24 U/L (ref 0–37)
Albumin: 1.3 g/dL — ABNORMAL LOW (ref 3.5–5.2)
Alkaline Phosphatase: 117 U/L (ref 39–117)
Bilirubin, Direct: <0.1 mg/dL (ref 0.0–0.3)
Total Bilirubin: 0.1 mg/dL — ABNORMAL LOW (ref 0.3–1.2)
Total Protein: 5 g/dL — ABNORMAL LOW (ref 6.0–8.3)

## 2012-03-07 LAB — LIPID PANEL
Cholesterol: 358 mg/dL — ABNORMAL HIGH (ref 0–200)
HDL: 77 mg/dL (ref >39)
LDL Cholesterol: 252 mg/dL — ABNORMAL HIGH (ref 0–99)
Total CHOL/HDL Ratio: 4.6 RATIO
Triglycerides: 145 mg/dL (ref <150)
VLDL: 29 mg/dL (ref 0–40)

## 2012-03-07 LAB — URINALYSIS, ROUTINE W REFLEX MICROSCOPIC
Bilirubin Urine: NEGATIVE
Glucose, UA: NEGATIVE mg/dL
Ketones, ur: NEGATIVE mg/dL
Nitrite: NEGATIVE
Protein, ur: >300 mg/dL — AB
Specific Gravity, Urine: 1.026 (ref 1.005–1.030)
Urobilinogen, UA: 0.2 mg/dL (ref 0.0–1.0)
pH: 7.5 (ref 5.0–8.0)

## 2012-03-07 LAB — SEDIMENTATION RATE: Sed Rate: 90 mm/hr — ABNORMAL HIGH (ref 0–22)

## 2012-03-07 LAB — TROPONIN I
Troponin I: <0.3 ng/mL (ref <0.30)
Troponin I: <0.3 ng/mL (ref <0.30)
Troponin I: <0.3 ng/mL (ref <0.30)

## 2012-03-07 MED ORDER — SODIUM CHLORIDE 0.9 % IJ SOLN
3.0000 mL | INTRAMUSCULAR | Status: DC | PRN
  Administered 2012-03-07: 3 mL via INTRAVENOUS

## 2012-03-07 MED ORDER — ASPIRIN 325 MG PO TABS
325.0000 mg | ORAL_TABLET | ORAL | Status: AC
  Administered 2012-03-07: 325 mg via ORAL
  Filled 2012-03-07: qty 1

## 2012-03-07 MED ORDER — MYCOPHENOLATE MOFETIL 250 MG PO CAPS
250.0000 mg | ORAL_CAPSULE | Freq: Every day | ORAL | Status: DC
  Administered 2012-03-07 – 2012-03-08 (×2): 250 mg via ORAL
  Filled 2012-03-07 (×3): qty 1

## 2012-03-07 MED ORDER — HYDROMORPHONE HCL PF 1 MG/ML IJ SOLN
0.5000 mg | INTRAMUSCULAR | Status: DC | PRN
  Administered 2012-03-07 (×2): 1 mg via INTRAVENOUS
  Filled 2012-03-07 (×2): qty 1

## 2012-03-07 MED ORDER — RAMIPRIL 1.25 MG PO CAPS
1.2500 mg | ORAL_CAPSULE | Freq: Every day | ORAL | Status: DC
  Administered 2012-03-07 – 2012-03-08 (×2): 1.25 mg via ORAL
  Filled 2012-03-07 (×3): qty 1

## 2012-03-07 MED ORDER — ATENOLOL 25 MG PO TABS
25.0000 mg | ORAL_TABLET | Freq: Every day | ORAL | Status: DC
  Administered 2012-03-07 – 2012-03-08 (×2): 25 mg via ORAL
  Filled 2012-03-07 (×3): qty 1

## 2012-03-07 MED ORDER — PANTOPRAZOLE SODIUM 40 MG PO TBEC
40.0000 mg | DELAYED_RELEASE_TABLET | Freq: Every day | ORAL | Status: DC
  Administered 2012-03-07 – 2012-03-08 (×2): 40 mg via ORAL
  Filled 2012-03-07 (×4): qty 1

## 2012-03-07 MED ORDER — FUROSEMIDE 10 MG/ML IJ SOLN
40.0000 mg | Freq: Three times a day (TID) | INTRAMUSCULAR | Status: DC
  Administered 2012-03-07 – 2012-03-09 (×5): 40 mg via INTRAVENOUS
  Filled 2012-03-07 (×11): qty 4

## 2012-03-07 MED ORDER — FUROSEMIDE 40 MG PO TABS
40.0000 mg | ORAL_TABLET | Freq: Every day | ORAL | Status: DC
  Administered 2012-03-07: 40 mg via ORAL
  Filled 2012-03-07: qty 1

## 2012-03-07 MED ORDER — SODIUM CHLORIDE 0.9 % IV SOLN: 250.0000 mL | INTRAVENOUS | Status: DC | PRN

## 2012-03-07 MED ORDER — MYCOPHENOLATE MOFETIL 250 MG PO CAPS
500.0000 mg | ORAL_CAPSULE | Freq: Every day | ORAL | Status: DC
  Administered 2012-03-07 – 2012-03-08 (×2): 500 mg via ORAL
  Filled 2012-03-07 (×3): qty 2

## 2012-03-07 MED ORDER — ONDANSETRON HCL 4 MG/2ML IJ SOLN
4.0000 mg | Freq: Once | INTRAMUSCULAR | Status: AC
  Administered 2012-03-07: 4 mg via INTRAVENOUS
  Filled 2012-03-07: qty 2

## 2012-03-07 MED ORDER — PREDNISONE 10 MG PO TABS
10.0000 mg | ORAL_TABLET | Freq: Every morning | ORAL | Status: DC
  Administered 2012-03-07 – 2012-03-08 (×2): 10 mg via ORAL
  Filled 2012-03-07 (×3): qty 1

## 2012-03-07 MED ORDER — KETOROLAC TROMETHAMINE 30 MG/ML IJ SOLN
30.0000 mg | Freq: Three times a day (TID) | INTRAMUSCULAR | Status: AC
  Administered 2012-03-07 – 2012-03-08 (×5): 30 mg via INTRAVENOUS
  Filled 2012-03-07 (×6): qty 1

## 2012-03-07 MED ORDER — LEVOTHYROXINE SODIUM 50 MCG PO TABS
50.0000 ug | ORAL_TABLET | Freq: Every day | ORAL | Status: DC
  Administered 2012-03-07 – 2012-03-08 (×2): 50 ug via ORAL
  Filled 2012-03-07 (×4): qty 1

## 2012-03-07 MED ORDER — DEXTROSE 5 % AND 0.45 % NACL IV BOLUS
200.0000 mL | Freq: Once | INTRAVENOUS | Status: AC
  Administered 2012-03-07: 200 mL via INTRAVENOUS

## 2012-03-07 MED ORDER — ATORVASTATIN CALCIUM 80 MG PO TABS
80.0000 mg | ORAL_TABLET | Freq: Every day | ORAL | Status: DC
  Administered 2012-03-07 – 2012-03-08 (×2): 80 mg via ORAL
  Filled 2012-03-07 (×3): qty 1

## 2012-03-07 MED ORDER — SODIUM CHLORIDE 0.9 % IJ SOLN
3.0000 mL | Freq: Two times a day (BID) | INTRAMUSCULAR | Status: DC
  Administered 2012-03-07 – 2012-03-08 (×4): 3 mL via INTRAVENOUS

## 2012-03-07 MED ORDER — FENTANYL CITRATE 0.05 MG/ML IJ SOLN
100.0000 ug | Freq: Once | INTRAMUSCULAR | Status: AC
  Administered 2012-03-07: 100 ug via INTRAVENOUS
  Filled 2012-03-07: qty 2

## 2012-03-07 MED ORDER — ASPIRIN 81 MG PO CHEW
81.0000 mg | CHEWABLE_TABLET | Freq: Every day | ORAL | Status: DC
  Administered 2012-03-07 – 2012-03-08 (×2): 81 mg via ORAL
  Filled 2012-03-07 (×3): qty 1

## 2012-03-07 MED ORDER — HEPARIN SODIUM (PORCINE) 5000 UNIT/ML IJ SOLN
5000.0000 [IU] | Freq: Three times a day (TID) | INTRAMUSCULAR | Status: DC
  Administered 2012-03-07 – 2012-03-09 (×7): 5000 [IU] via SUBCUTANEOUS
  Filled 2012-03-07 (×10): qty 1

## 2012-03-07 NOTE — Progress Notes (Signed)
BP running low this afternoon. 87/70, then rechecked after approx. An hour and was 98/67.  Pt had received a.m. Cardiac meds and 2 doses of Dilaudid for c/o 10/10 pain in flank, back area. Stated hurt "all over".  After receiving Toradol stated later "that did the trick".  Appears much more comfortable, talkative, relaxed. Has not asked for anything for pain since Toradol.  Dr. Pete Glatter contacted re: BP. Informed a.m. meds given as well as dilaudid x 2.  Ordered hold Lasix x 1 dose and give D51/2NS x 200cc over 2 hrs.  Parameters given for lasix.  Will cont to monitor BP.

## 2012-03-07 NOTE — ED Notes (Signed)
Pt sts more pain when she moves arms  and breathes. Pt is able to talk with no difficulty

## 2012-03-07 NOTE — ED Notes (Signed)
Pt sts chest pain started yestarday and became worse tonight. Pain starts at right shoulder and radiated to back.

## 2012-03-07 NOTE — ED Provider Notes (Signed)
History     CSN: [id number]  Arrival date & time 03/07/12  0036   First MD Initiated Contact with Patient 03/07/12 0100      Chief Complaint  Patient presents with  . Chest Pain    (Consider location/radiation/quality/duration/timing/severity/associated sxs/prior treatment) HPI This is a 56 year old female with a history of lupus and coronary artery disease. She is here with a three-day history of pain in her right flank, right shoulder, right upper chest and right upper back. It has been coming and going but acutely worsened yesterday evening. She describes the pain is moderate to severe. It is worse with palpation or movement. It is described as a deep dull pain. There's been no associated shortness of breath or diaphoresis. She has had nausea without vomiting. She denies recent injury. She is not aware of having a fever but states she has had chills. She was seen by her primary care physician the day before yesterday who ordered a CT scan of the abdomen and pelvis without contrast. The CT was negative for nephrolithiasis but showed ascites and mesenteric edema worrisome for volume overload. The patient states she has been admitted for volume overload in the past. She attributes this to her medications notably prednisone. She is now on Lasix for this. She declines anything for pain at this time because she wishes to "retain her faculties".  Past Medical History  Diagnosis Date  . Peripheral vascular disease, unspecified   . Esophageal reflux   . Mitral valve insufficiency and aortic valve insufficiency   . Immune thrombocytopenic purpura   . Unspecified disease of pericardium   . Chronic ischemic heart disease, unspecified     w/ stents in mid circumflex and mid RCA in 03, and balloon angioplasty of Diagonal branch 10-03, last cath Sept 08- 2 vessel disease w/ patent stents  . Lupus erythematosus   . Hypothyroidism     Past Surgical History  Procedure Date  . Plastic surgical  repair      dog bite to leg  . Splenectomy 5-04  . Cholecystectomy, laparoscopic 2010    Family History  Problem Relation Age of Onset  . Cancer Mother     breast and cervical  . Paget's disease of bone Mother   . Fibromyalgia Mother   . Cancer Paternal Grandfather     colon  . Heart attack Other   . Coronary artery disease Other   . Diabetes Other   . Hypertension Other   . Hyperlipidemia Other   . Hypertension Father   . Heart disease Father     PVD  . GER disease Father     History  Substance Use Topics  . Smoking status: Former Smoker -- 0.5 packs/day for 30 years    Types: Cigarettes    Quit date: 12/23/2011  . Smokeless tobacco: Never Used  . Alcohol Use: No    OB History    Grav Para Term Preterm Abortions TAB SAB Ect Mult Living                  Review of Systems  All other systems reviewed and are negative.    Allergies  Review of patient's allergies indicates no known allergies.  Home Medications   Current Outpatient Rx  Name  Route  Sig  Dispense  Refill  . ASPIRIN 81 MG PO CHEW   Oral   Chew 81 mg by mouth daily.         . ATENOLOL 25 MG PO  TABS   Oral   Take 25 mg by mouth daily.         . FUROSEMIDE 40 MG PO TABS   Oral   Take 1 tablet (40 mg total) by mouth daily.   30 tablet   11   . LEVOTHYROXINE SODIUM 50 MCG PO TABS   Oral   Take 50 mcg by mouth daily.         Marland Kitchen MYCOPHENOLATE MOFETIL 250 MG PO CAPS   Oral   Take 500-750 mg by mouth 2 (two) times daily. Take 2 capsules in the morning and 1 capsules at night.         Marland Kitchen PANTOPRAZOLE SODIUM 40 MG PO TBEC   Oral   Take 40 mg by mouth daily.         Marland Kitchen PREDNISONE 20 MG PO TABS   Oral   Take 10 mg by mouth every morning.          Marland Kitchen RAMIPRIL 1.25 MG PO CAPS   Oral   Take 1.25 mg by mouth daily.         Marland Kitchen NITROGLYCERIN 0.4 MG SL SUBL   Sublingual   Place 0.4 mg under the tongue every 5 (five) minutes x 3 doses as needed. For chest pain           BP  142/85  Pulse 90  Temp 98.1 F (36.7 C) (Oral)  Resp 15  SpO2 98%  Physical Exam General: Well-developed, well-nourished female in no acute distress; appearance consistent with age of record HENT: normocephalic, atraumatic Eyes: pupils equal round and reactive to light; extraocular muscles intact Neck: supple Heart: regular rate and rhythm; no murmurs, rubs or gallops Lungs: clear to auscultation bilaterally Chest: Right upper chest and right upper back tenderness with pain on active or passive range of motion of the right shoulder Abdomen: soft; nondistended; right flank tenderness at the lower aspect of the rib cage without deformity or crepitus; bowel sounds present Extremities: No deformity; full range of motion; trace edema of lower legs Neurologic: Awake, alert and oriented; motor function intact in all extremities and symmetric; no facial droop Skin: Warm and dry Psychiatric: Normal mood and affect    ED Course  Procedures (including critical care time)     MDM   Nursing notes and vitals signs, including pulse oximetry, reviewed.  Summary of this visit's results, reviewed by myself:  Labs:  Results for orders placed during the hospital encounter of 03/07/12 (from the past 24 hour(s))  CBC     Status: Abnormal   Collection Time   03/07/12 12:45 AM      Component Value Range   WBC 7.8  4.0 - 10.5 K/uL   RBC 3.55 (*) 3.87 - 5.11 MIL/uL   Hemoglobin 11.3 (*) 12.0 - 15.0 g/dL   HCT 16.1 (*) 09.6 - 04.5 %   MCV 94.9  78.0 - 100.0 fL   MCH 31.8  26.0 - 34.0 pg   MCHC 33.5  30.0 - 36.0 g/dL   RDW 40.9 (*) 81.1 - 91.4 %   Platelets 199  150 - 400 K/uL  BASIC METABOLIC PANEL     Status: Abnormal   Collection Time   03/07/12 12:45 AM      Component Value Range   Sodium 134 (*) 135 - 145 mEq/L   Potassium 4.1  3.5 - 5.1 mEq/L   Chloride 99  96 - 112 mEq/L   CO2 27  19 -  32 mEq/L   Glucose, Bld 93  70 - 99 mg/dL   BUN 18  6 - 23 mg/dL   Creatinine, Ser 1.32 (*)  0.50 - 1.10 mg/dL   Calcium 8.8  8.4 - 44.0 mg/dL   GFR calc non Af Amer 52 (*) >90 mL/min   GFR calc Af Amer 60 (*) >90 mL/min  TROPONIN I     Status: Normal   Collection Time   03/07/12 12:45 AM      Component Value Range   Troponin I <0.30  <0.30 ng/mL  HEPATIC FUNCTION PANEL     Status: Abnormal   Collection Time   03/07/12 12:45 AM      Component Value Range   Total Protein 5.0 (*) 6.0 - 8.3 g/dL   Albumin 1.3 (*) 3.5 - 5.2 g/dL   AST 24  0 - 37 U/L   ALT 35  0 - 35 U/L   Alkaline Phosphatase 117  39 - 117 U/L   Total Bilirubin 0.1 (*) 0.3 - 1.2 mg/dL   Bilirubin, Direct <1.0  0.0 - 0.3 mg/dL   Indirect Bilirubin NOT CALCULATED  0.3 - 0.9 mg/dL  URINALYSIS, ROUTINE W REFLEX MICROSCOPIC     Status: Abnormal   Collection Time   03/07/12  1:36 AM      Component Value Range   Color, Urine YELLOW  YELLOW   APPearance CLOUDY (*) CLEAR   Specific Gravity, Urine 1.026  1.005 - 1.030   pH 7.5  5.0 - 8.0   Glucose, UA NEGATIVE  NEGATIVE mg/dL   Hgb urine dipstick TRACE (*) NEGATIVE   Bilirubin Urine NEGATIVE  NEGATIVE   Ketones, ur NEGATIVE  NEGATIVE mg/dL   Protein, ur >272 (*) NEGATIVE mg/dL   Urobilinogen, UA 0.2  0.0 - 1.0 mg/dL   Nitrite NEGATIVE  NEGATIVE   Leukocytes, UA TRACE (*) NEGATIVE  URINE MICROSCOPIC-ADD ON     Status: Abnormal   Collection Time   03/07/12  1:36 AM      Component Value Range   Squamous Epithelial / LPF FEW (*) RARE   WBC, UA 0-2  <3 WBC/hpf   RBC / HPF 0-2  <3 RBC/hpf   Bacteria, UA RARE  RARE  TROPONIN I     Status: Normal   Collection Time   03/07/12  3:50 AM      Component Value Range   Troponin I <0.30  <0.30 ng/mL    Imaging Studies: Dg Chest Port 1 View  03/07/2012  *RADIOLOGY REPORT*  Clinical Data: Chest pain and shortness of breath.  PORTABLE CHEST - 1 VIEW  Comparison: 02/17/2012  Findings: Shallow inspiration.  Heart size and pulmonary vascularity are normal for respiratory effort.  There is infiltration or atelectasis in  the left lung base.  Consider pneumonia.  No blunting of costophrenic angles.  No pneumothorax.  IMPRESSION: Shallow inspiration with new infiltration or atelectasis in the left lung base.   Original Report Authenticated By: Burman Nieves, M.D.       EKG Interpretation:  Date & Time: 03/07/2012 12:35 AM  Rate: 92  Rhythm: normal sinus rhythm  QRS Axis: normal  Intervals: normal  ST/T Wave abnormalities: normal  Conduction Disutrbances:none  Narrative Interpretation:   Old EKG Reviewed: unchanged  4:32 AM Patient states pain is worse; is now requesting something for pain.         Hanley Seamen, MD 03/07/12 [phone]

## 2012-03-07 NOTE — Progress Notes (Signed)
1834 BP rechecked after 200cc IVF. Result 127/78. Pt continues to appear to feel much better this evening overall.

## 2012-03-07 NOTE — Progress Notes (Signed)
Admission note and labs reviewed. Patient interviewed: she reports flank pain, arm pain, chest pain, neck pain. Renal U/S in progress\  Plan: for pain - ketorolac 30 mg IV q 8 x 5 doses  For fluid overload IV furosemide 40 mg q 6  For cholesterol - resume crestor 20 mg daily

## 2012-03-07 NOTE — H&P (Signed)
[facility] History and Physical  [name]:[id number] DOB: June 24, 1955 DOA: 03/07/2012  Referring physician: ED PCP: Illene Regulus, MD  Specialists: Clifton James (cards), also sees rheumatology, Dr. Hyman Hopes is nephrologist  Chief Complaint: Flank pain  HPI: [name] is a 56 y.o. female who presents to the ED with complaint of flank pain.  This onset (this time) 3 days ago, located in her R flank, with radiation to R shoulder.  Intermittent in frequency but acutely worsened yesterday evening, moderate to severe in severity.  Worse with kidney percussion or movement.  No SOB, has had nausea but no vomiting.  She initially saw Dr. Debby Bud day before yesterday who was concerned about possibility of nephrolithiasis, he ordered a CT scan but the CT scan didn't demonstrate any hydronephrosis nor nephrolithiasis, just ascities / fluid overload (likely a chronic finding in this patient).    Review of Systems: 12 systems reviewed and negative.  Past Medical History  Diagnosis Date  . Peripheral vascular disease, unspecified   . Esophageal reflux   . Mitral valve insufficiency and aortic valve insufficiency   . Immune thrombocytopenic purpura   . Unspecified disease of pericardium   . Chronic ischemic heart disease, unspecified     w/ stents in mid circumflex and mid RCA in 03, and balloon angioplasty of Diagonal branch 10-03, last cath Sept 08- 2 vessel disease w/ patent stents  . Lupus erythematosus   . Hypothyroidism    Past Surgical History  Procedure Date  . Plastic surgical repair      dog bite to leg  . Splenectomy 5-04  . Cholecystectomy, laparoscopic 2010   Social History:  reports that she quit smoking about 2 months ago. Her smoking use included Cigarettes. She has a 15 pack-year smoking history. She has never used smokeless tobacco. She reports that she does not drink alcohol or use illicit drugs.   No Known Allergies  Family History  Problem Relation Age of  Onset  . Cancer Mother     breast and cervical  . Paget's disease of bone Mother   . Fibromyalgia Mother   . Cancer Paternal Grandfather     colon  . Heart attack Other   . Coronary artery disease Other   . Diabetes Other   . Hypertension Other   . Hyperlipidemia Other   . Hypertension Father   . Heart disease Father     PVD  . GER disease Father     Prior to Admission medications   Medication Sig Start Date End Date Taking? Authorizing Provider  aspirin 81 MG chewable tablet Chew 81 mg by mouth daily.   Yes Historical Provider, MD  atenolol (TENORMIN) 25 MG tablet Take 25 mg by mouth daily.   Yes Historical Provider, MD  furosemide (LASIX) 40 MG tablet Take 1 tablet (40 mg total) by mouth daily. 02/26/12  Yes Jacques Navy, MD  levothyroxine (SYNTHROID, LEVOTHROID) 50 MCG tablet Take 50 mcg by mouth daily.   Yes Historical Provider, MD  mycophenolate (CELLCEPT) 250 MG capsule Take 500-750 mg by mouth 2 (two) times daily. Take 2 capsules in the morning and 1 capsules at night.   Yes Historical Provider, MD  pantoprazole (PROTONIX) 40 MG tablet Take 40 mg by mouth daily.   Yes Historical Provider, MD  predniSONE (DELTASONE) 20 MG tablet Take 10 mg by mouth every morning.    Yes Historical Provider, MD  ramipril (ALTACE) 1.25 MG capsule Take 1.25 mg by mouth daily. 05/01/11  Yes Kathleene Hazel, MD  nitroGLYCERIN (NITROSTAT) 0.4 MG SL tablet Place 0.4 mg under the tongue every 5 (five) minutes x 3 doses as needed. For chest pain    Historical Provider, MD   Physical Exam: Filed Vitals:   03/07/12 0245 03/07/12 0300 03/07/12 0315 03/07/12 0330  BP:  137/83  142/85  Pulse:      Temp:      TempSrc:      Resp: 15 24 19 15   SpO2:        General:  NAD, resting comfortably in bed Eyes: PEERLA EOMI ENT: mucous membranes moist Neck: supple w/o JVD Cardiovascular: RRR w/o MRG Respiratory: CTA B Abdomen: soft, nt, distended with ascities, bs+, marked flank pain and CVA  tenderness on the R side to percussion Skin: no rash nor lesion Musculoskeletal: MAE, full ROM all 4 extremities, trace pitting edema BLE Psychiatric: normal tone and affect Neurologic: AAOx3, grossly non-focal  Labs on Admission:  Basic Metabolic Panel:  Lab 03/07/12 8657  NA 134*  K 4.1  CL 99  CO2 27  GLUCOSE 93  BUN 18  CREATININE 1.16*  CALCIUM 8.8  MG --  PHOS --   Liver Function Tests:  Lab 03/07/12 0045  AST 24  ALT 35  ALKPHOS 117  BILITOT 0.1*  PROT 5.0*  ALBUMIN 1.3*   No results found for this basename: LIPASE:5,AMYLASE:5 in the last 168 hours No results found for this basename: AMMONIA:5 in the last 168 hours CBC:  Lab 03/07/12 0045  WBC 7.8  NEUTROABS --  HGB 11.3*  HCT 33.7*  MCV 94.9  PLT 199   Cardiac Enzymes:  Lab 03/07/12 0350 03/07/12 0045  CKTOTAL -- --  CKMB -- --  CKMBINDEX -- --  TROPONINI <0.30 <0.30    BNP (last 3 results)  Basename 02/27/12 1202 02/17/12 1520 08/19/11 1206  PROBNP 271.0* 2825.0* 190.0*   CBG: No results found for this basename: GLUCAP:5 in the last 168 hours  Radiological Exams on Admission: Dg Chest Port 1 View  03/07/2012  *RADIOLOGY REPORT*  Clinical Data: Chest pain and shortness of breath.  PORTABLE CHEST - 1 VIEW  Comparison: 02/17/2012  Findings: Shallow inspiration.  Heart size and pulmonary vascularity are normal for respiratory effort.  There is infiltration or atelectasis in the left lung base.  Consider pneumonia.  No blunting of costophrenic angles.  No pneumothorax.  IMPRESSION: Shallow inspiration with new infiltration or atelectasis in the left lung base.   Original Report Authenticated By: Burman Nieves, M.D.     EKG: Independently reviewed.  Assessment/Plan Principal Problem:  *Flank pain Active Problems:  HYPERLIPIDEMIA-MIXED  CORONARY ARTERY DISEASE, S/P PTCA  Lupus erythematosus  Chronic lupus nephritis   1. Flank pain - will order B renal ultrasound, CVA tenderness on exam  highly suggestive of either nephrolithiasis or pyelonephritis, but patient continues to have no evidence on UA (or systemic findings for that mater) of infection, and her CT showed no evidence of nephrolithiasis.  As far as I am aware, lupus nephritis does not typically cause CVA tenderness; however, there is of course no question that the patient does indeed have lupus nephritis (biopsy proven) with nephrotic range proteinuria. 2. Nephrotic syndrome - with h/o hyperlipidemia, last lipid panel on the 6th was very abnormal but patient states she was not fasting at that time, since she is fasting now will go ahead and repeat lipid panel today, she is not taking statin at this time she says because her  cardiologist told her not to due to muscle aches. 3. H/o CAD - patient also has increased risk from SLE, will continue MI r/o with 3rd troponin and keep patient on Tele monitor. 4. Left Lung base atelectasis vs infiltrate - no other evidence of PNA, would monitor at this point, likely atelectasis from fluid overload / ascities in abdomen, shallow inspiration also noted on CXR.  No consults obtained.  Code Status: Full Code (must indicate code status--if unknown or must be presumed, indicate so) Family Communication: Spoke with patient and mother at bedside (indicate person spoken with, if applicable, with phone number if by telephone) Disposition Plan: Admit to obs (indicate anticipated LOS)  Time spent: 70 min  GARDNER, JARED M. [facility] Pager [phone]  If 7PM-7AM, please contact night-coverage [email or web address] Password [facility] 03/07/2012, 5:36 AM

## 2012-03-08 ENCOUNTER — Inpatient Hospital Stay (HOSPITAL_COMMUNITY): Payer: Medicare Other

## 2012-03-08 DIAGNOSIS — M329 Systemic lupus erythematosus, unspecified: Secondary | ICD-10-CM

## 2012-03-08 DIAGNOSIS — N058 Unspecified nephritic syndrome with other morphologic changes: Secondary | ICD-10-CM | POA: Diagnosis not present

## 2012-03-08 DIAGNOSIS — L930 Discoid lupus erythematosus: Secondary | ICD-10-CM

## 2012-03-08 DIAGNOSIS — R52 Pain, unspecified: Secondary | ICD-10-CM | POA: Diagnosis not present

## 2012-03-08 DIAGNOSIS — I080 Rheumatic disorders of both mitral and aortic valves: Secondary | ICD-10-CM | POA: Diagnosis not present

## 2012-03-08 DIAGNOSIS — I10 Essential (primary) hypertension: Secondary | ICD-10-CM | POA: Diagnosis present

## 2012-03-08 DIAGNOSIS — J0100 Acute maxillary sinusitis, unspecified: Secondary | ICD-10-CM | POA: Diagnosis not present

## 2012-03-08 DIAGNOSIS — R809 Proteinuria, unspecified: Secondary | ICD-10-CM | POA: Diagnosis not present

## 2012-03-08 DIAGNOSIS — R1031 Right lower quadrant pain: Secondary | ICD-10-CM | POA: Diagnosis present

## 2012-03-08 DIAGNOSIS — Z79899 Other long term (current) drug therapy: Secondary | ICD-10-CM | POA: Diagnosis not present

## 2012-03-08 DIAGNOSIS — Z872 Personal history of diseases of the skin and subcutaneous tissue: Secondary | ICD-10-CM | POA: Diagnosis not present

## 2012-03-08 DIAGNOSIS — R509 Fever, unspecified: Secondary | ICD-10-CM | POA: Diagnosis not present

## 2012-03-08 DIAGNOSIS — E039 Hypothyroidism, unspecified: Secondary | ICD-10-CM | POA: Diagnosis not present

## 2012-03-08 DIAGNOSIS — K219 Gastro-esophageal reflux disease without esophagitis: Secondary | ICD-10-CM | POA: Diagnosis not present

## 2012-03-08 DIAGNOSIS — IMO0002 Reserved for concepts with insufficient information to code with codable children: Secondary | ICD-10-CM | POA: Diagnosis not present

## 2012-03-08 DIAGNOSIS — E785 Hyperlipidemia, unspecified: Secondary | ICD-10-CM

## 2012-03-08 DIAGNOSIS — Z7982 Long term (current) use of aspirin: Secondary | ICD-10-CM | POA: Diagnosis not present

## 2012-03-08 DIAGNOSIS — N039 Chronic nephritic syndrome with unspecified morphologic changes: Secondary | ICD-10-CM | POA: Diagnosis present

## 2012-03-08 DIAGNOSIS — D693 Immune thrombocytopenic purpura: Secondary | ICD-10-CM | POA: Diagnosis not present

## 2012-03-08 DIAGNOSIS — E8779 Other fluid overload: Secondary | ICD-10-CM | POA: Diagnosis present

## 2012-03-08 DIAGNOSIS — M25519 Pain in unspecified shoulder: Secondary | ICD-10-CM | POA: Diagnosis not present

## 2012-03-08 DIAGNOSIS — R1011 Right upper quadrant pain: Secondary | ICD-10-CM | POA: Diagnosis not present

## 2012-03-08 DIAGNOSIS — R109 Unspecified abdominal pain: Secondary | ICD-10-CM | POA: Diagnosis not present

## 2012-03-08 DIAGNOSIS — Z8679 Personal history of other diseases of the circulatory system: Secondary | ICD-10-CM | POA: Diagnosis not present

## 2012-03-08 DIAGNOSIS — D649 Anemia, unspecified: Secondary | ICD-10-CM | POA: Diagnosis not present

## 2012-03-08 DIAGNOSIS — J329 Chronic sinusitis, unspecified: Secondary | ICD-10-CM | POA: Diagnosis not present

## 2012-03-08 DIAGNOSIS — Z87891 Personal history of nicotine dependence: Secondary | ICD-10-CM | POA: Diagnosis not present

## 2012-03-08 DIAGNOSIS — I739 Peripheral vascular disease, unspecified: Secondary | ICD-10-CM | POA: Diagnosis not present

## 2012-03-08 DIAGNOSIS — I259 Chronic ischemic heart disease, unspecified: Secondary | ICD-10-CM | POA: Diagnosis not present

## 2012-03-08 LAB — URINE MICROSCOPIC-ADD ON

## 2012-03-08 LAB — BASIC METABOLIC PANEL
BUN: 24 mg/dL — ABNORMAL HIGH (ref 6–23)
CO2: 26 mEq/L (ref 19–32)
Calcium: 7.7 mg/dL — ABNORMAL LOW (ref 8.4–10.5)
Chloride: 100 mEq/L (ref 96–112)
Creatinine, Ser: 1.86 mg/dL — ABNORMAL HIGH (ref 0.50–1.10)
GFR calc Af Amer: 34 mL/min — ABNORMAL LOW (ref >90)
GFR calc non Af Amer: 29 mL/min — ABNORMAL LOW (ref >90)
Glucose, Bld: 94 mg/dL (ref 70–99)
Potassium: 4.3 mEq/L (ref 3.5–5.1)
Sodium: 134 mEq/L — ABNORMAL LOW (ref 135–145)

## 2012-03-08 LAB — URINALYSIS, ROUTINE W REFLEX MICROSCOPIC
Bilirubin Urine: NEGATIVE
Glucose, UA: NEGATIVE mg/dL
Ketones, ur: NEGATIVE mg/dL
Nitrite: NEGATIVE
Protein, ur: 100 mg/dL — AB
Specific Gravity, Urine: 1.014 (ref 1.005–1.030)
Urobilinogen, UA: 0.2 mg/dL (ref 0.0–1.0)
pH: 6 (ref 5.0–8.0)

## 2012-03-08 LAB — CBC
HCT: 28.2 % — ABNORMAL LOW (ref 36.0–46.0)
Hemoglobin: 9.4 g/dL — ABNORMAL LOW (ref 12.0–15.0)
MCH: 32 pg (ref 26.0–34.0)
MCHC: 33.3 g/dL (ref 30.0–36.0)
MCV: 95.9 fL (ref 78.0–100.0)
Platelets: 186 10*3/uL (ref 150–400)
RBC: 2.94 MIL/uL — ABNORMAL LOW (ref 3.87–5.11)
RDW: 16.3 % — ABNORMAL HIGH (ref 11.5–15.5)
WBC: 9.2 10*3/uL (ref 4.0–10.5)

## 2012-03-08 NOTE — Progress Notes (Signed)
Subjective: Pain is better but there is still low level discomfort and she is concerned it will get worse. She did have low BP over night and did receive fluid bolus. No other symptoms  Objective: Lab: Lab Results  Component Value Date   WBC 9.2 03/08/2012   HGB 9.4* 03/08/2012   HCT 28.2* 03/08/2012   MCV 95.9 03/08/2012   PLT 186 03/08/2012   BMET    Component Value Date/Time   NA 134* 03/08/2012 0505   K 4.3 03/08/2012 0505   CL 100 03/08/2012 0505   CO2 26 03/08/2012 0505   GLUCOSE 94 03/08/2012 0505   BUN 24* 03/08/2012 0505   CREATININE 1.86* 03/08/2012 0505   CREATININE TEST NOT PERFORMED 09/10/2011 1150   CALCIUM 7.7* 03/08/2012 0505   GFRNONAA 29* 03/08/2012 0505   GFRAA 34* 03/08/2012 0505     Imaging: Renal U/S normal  Scheduled Meds:   . aspirin  81 mg Oral Daily  . atenolol  25 mg Oral Daily  . atorvastatin  80 mg Oral q1800  . furosemide  40 mg Intravenous Q8H  . heparin  5,000 Units Subcutaneous Q8H  . ketorolac  30 mg Intravenous Q8H  . levothyroxine  50 mcg Oral QAC breakfast  . mycophenolate  250 mg Oral QHS  . mycophenolate  500 mg Oral Daily  . pantoprazole  40 mg Oral Daily  . predniSONE  10 mg Oral q morning - 10a  . ramipril  1.25 mg Oral Daily  . sodium chloride  3 mL Intravenous Q12H   Continuous Infusions:  PRN Meds:.sodium chloride, HYDROmorphone (DILAUDID) injection, sodium chloride   Physical Exam: Filed Vitals:   03/08/12 0451  BP: 126/79  Pulse: 78  Temp: 98.4 F (36.9 C)  Resp: 16   Obese AA woman in bed in no distress Cor- RR PUlm - normal respirations     Assessment/Plan: 1. Right flank pain - source not identified. CT normal , U/S normal.  Plan U/A  Consult with her Rheumatologist  2. REnal - lupus nephritis. Creatinine elevated most likely secondary to attempted diuresis  Plan F/u Bmet in AM   3. CAD - stable. Will d/c/ tele.   4. Fluid overload - most likely renal in origin  Plan - follow-up PA and  Lat CXR  5. Hyperlipidemia - resumed crestor     [employer] [facility] (o) [phone]; (c) [phone] Call-grp - Patsi Sears IM  Tele: [phone]  03/08/2012, 8:33 AM

## 2012-03-08 NOTE — Progress Notes (Signed)
Pt rested well and had an uneventful night. Hourly rounding completed.

## 2012-03-09 ENCOUNTER — Emergency Department (HOSPITAL_COMMUNITY)
Admission: EM | Admit: 2012-03-09 | Discharge: 2012-03-10 | Disposition: A | Discharge status: Home / Self Care | Payer: Medicare Other | Attending: Emergency Medicine | Admitting: Emergency Medicine

## 2012-03-09 ENCOUNTER — Encounter (HOSPITAL_COMMUNITY): Payer: Self-pay | Admitting: *Deleted

## 2012-03-09 ENCOUNTER — Emergency Department (HOSPITAL_COMMUNITY): Payer: Medicare Other

## 2012-03-09 ENCOUNTER — Telehealth: Payer: Self-pay | Admitting: Internal Medicine

## 2012-03-09 DIAGNOSIS — N058 Unspecified nephritic syndrome with other morphologic changes: Secondary | ICD-10-CM | POA: Diagnosis not present

## 2012-03-09 DIAGNOSIS — I080 Rheumatic disorders of both mitral and aortic valves: Secondary | ICD-10-CM | POA: Insufficient documentation

## 2012-03-09 DIAGNOSIS — R109 Unspecified abdominal pain: Secondary | ICD-10-CM | POA: Diagnosis not present

## 2012-03-09 DIAGNOSIS — Z872 Personal history of diseases of the skin and subcutaneous tissue: Secondary | ICD-10-CM | POA: Insufficient documentation

## 2012-03-09 DIAGNOSIS — K219 Gastro-esophageal reflux disease without esophagitis: Secondary | ICD-10-CM | POA: Insufficient documentation

## 2012-03-09 DIAGNOSIS — R809 Proteinuria, unspecified: Secondary | ICD-10-CM | POA: Insufficient documentation

## 2012-03-09 DIAGNOSIS — Z87891 Personal history of nicotine dependence: Secondary | ICD-10-CM | POA: Insufficient documentation

## 2012-03-09 DIAGNOSIS — M329 Systemic lupus erythematosus, unspecified: Secondary | ICD-10-CM | POA: Diagnosis not present

## 2012-03-09 DIAGNOSIS — J329 Chronic sinusitis, unspecified: Secondary | ICD-10-CM | POA: Insufficient documentation

## 2012-03-09 DIAGNOSIS — E039 Hypothyroidism, unspecified: Secondary | ICD-10-CM | POA: Diagnosis not present

## 2012-03-09 DIAGNOSIS — I259 Chronic ischemic heart disease, unspecified: Secondary | ICD-10-CM | POA: Insufficient documentation

## 2012-03-09 DIAGNOSIS — I739 Peripheral vascular disease, unspecified: Secondary | ICD-10-CM | POA: Insufficient documentation

## 2012-03-09 DIAGNOSIS — D693 Immune thrombocytopenic purpura: Secondary | ICD-10-CM | POA: Insufficient documentation

## 2012-03-09 DIAGNOSIS — R509 Fever, unspecified: Secondary | ICD-10-CM | POA: Diagnosis not present

## 2012-03-09 DIAGNOSIS — R1011 Right upper quadrant pain: Secondary | ICD-10-CM | POA: Diagnosis not present

## 2012-03-09 DIAGNOSIS — D649 Anemia, unspecified: Secondary | ICD-10-CM | POA: Diagnosis not present

## 2012-03-09 DIAGNOSIS — Z7982 Long term (current) use of aspirin: Secondary | ICD-10-CM | POA: Insufficient documentation

## 2012-03-09 DIAGNOSIS — Z8679 Personal history of other diseases of the circulatory system: Secondary | ICD-10-CM | POA: Insufficient documentation

## 2012-03-09 DIAGNOSIS — Z79899 Other long term (current) drug therapy: Secondary | ICD-10-CM | POA: Insufficient documentation

## 2012-03-09 DIAGNOSIS — J0100 Acute maxillary sinusitis, unspecified: Secondary | ICD-10-CM | POA: Diagnosis not present

## 2012-03-09 LAB — URINE CULTURE: Colony Count: 40000

## 2012-03-09 LAB — BASIC METABOLIC PANEL
BUN: 27 mg/dL — ABNORMAL HIGH (ref 6–23)
CO2: 27 mEq/L (ref 19–32)
Calcium: 7.6 mg/dL — ABNORMAL LOW (ref 8.4–10.5)
Chloride: 102 mEq/L (ref 96–112)
Creatinine, Ser: 1.54 mg/dL — ABNORMAL HIGH (ref 0.50–1.10)
GFR calc Af Amer: 42 mL/min — ABNORMAL LOW (ref >90)
GFR calc non Af Amer: 37 mL/min — ABNORMAL LOW (ref >90)
Glucose, Bld: 89 mg/dL (ref 70–99)
Potassium: 4.2 mEq/L (ref 3.5–5.1)
Sodium: 137 mEq/L (ref 135–145)

## 2012-03-09 LAB — COMPREHENSIVE METABOLIC PANEL
ALT: 40 U/L — ABNORMAL HIGH (ref 0–35)
AST: 33 U/L (ref 0–37)
Albumin: 1.3 g/dL — ABNORMAL LOW (ref 3.5–5.2)
Alkaline Phosphatase: 137 U/L — ABNORMAL HIGH (ref 39–117)
BUN: 21 mg/dL (ref 6–23)
CO2: 25 mEq/L (ref 19–32)
Calcium: 8.2 mg/dL — ABNORMAL LOW (ref 8.4–10.5)
Chloride: 97 mEq/L (ref 96–112)
Creatinine, Ser: 1.42 mg/dL — ABNORMAL HIGH (ref 0.50–1.10)
GFR calc Af Amer: 47 mL/min — ABNORMAL LOW (ref >90)
GFR calc non Af Amer: 40 mL/min — ABNORMAL LOW (ref >90)
Glucose, Bld: 83 mg/dL (ref 70–99)
Potassium: 4.4 mEq/L (ref 3.5–5.1)
Sodium: 132 mEq/L — ABNORMAL LOW (ref 135–145)
Total Bilirubin: 0.1 mg/dL — ABNORMAL LOW (ref 0.3–1.2)
Total Protein: 5.6 g/dL — ABNORMAL LOW (ref 6.0–8.3)

## 2012-03-09 MED ORDER — HYDROCODONE-ACETAMINOPHEN 5-325 MG PO TABS: 1.0000 | ORAL_TABLET | Freq: Four times a day (QID) | ORAL | Status: DC | PRN

## 2012-03-09 MED ORDER — LEVOFLOXACIN 500 MG PO TABS
500.0000 mg | ORAL_TABLET | Freq: Once | ORAL | Status: AC
  Administered 2012-03-09: 500 mg via ORAL
  Filled 2012-03-09: qty 1

## 2012-03-09 MED ORDER — ROSUVASTATIN CALCIUM 20 MG PO TABS: 20.0000 mg | ORAL_TABLET | Freq: Every day | ORAL | Status: DC

## 2012-03-09 MED ORDER — OXYCODONE-ACETAMINOPHEN 5-325 MG PO TABS
2.0000 | ORAL_TABLET | Freq: Once | ORAL | Status: AC
  Administered 2012-03-09: 2 via ORAL
  Filled 2012-03-09: qty 2

## 2012-03-09 NOTE — Telephone Encounter (Signed)
Phone call from call-a-nurse: Pt just d/c from the hospital, calling b/c has severe flank pain and also reports a temp of 102.0 Chart reviewed, was d/c today, had no fever at the hospital, UCX was (-), pain was appropriately controlled. The advise from the nurse was to go to the ER d/t uncontrolled pain, I agree, furthermore she now reports fever and that is an additional reason to be reassessed.  If she decides not to go, she needs to call PCP (Dr Debby Bud) in AM or go to the ER later on tonight if sx severe

## 2012-03-09 NOTE — ED Provider Notes (Signed)
History     CSN: [id number]  Arrival date & time 03/09/12  2127   First MD Initiated Contact with Patient 03/09/12 2302      No chief complaint on file.   (Consider location/radiation/quality/duration/timing/severity/associated sxs/prior treatment) HPI Comments: 56 year old female with a history of lupus who has had approximately one month of right flank pain presents with a complaint of recurrent flank pain and fever. According to the medical record and the patient's report she was admitted to the hospital 2 days ago, evaluated with CT scan of the abdomen on December 12, MRI of the thoracic spine during her admission as well as an ultrasound of her kidneys. No definitive etiology of her symptoms was determined, the patient had improved during her admission though it was noted that she had significant fluid retention which is a chronic problem given her prednisone use. Since going home earlier today she has developed a fever up to 102 at home along with ongoing right flank pain. Her symptoms are daily, wax and wane in intensity and seems to get worse with eating. She has had a cholecystectomy in the past. There is no associated vomiting, diarrhea, rashes, headache. She does complain of right maxillary sinus fullness, she has been blowing her nose and getting blood out of her right nostril with excessive mucous as well as postnasal at night. She was treated for sinusitis 3 months ago  The history is provided by the patient, a relative and medical records.    Past Medical History  Diagnosis Date  . Peripheral vascular disease, unspecified   . Esophageal reflux   . Mitral valve insufficiency and aortic valve insufficiency   . Immune thrombocytopenic purpura   . Unspecified disease of pericardium   . Chronic ischemic heart disease, unspecified     w/ stents in mid circumflex and mid RCA in 03, and balloon angioplasty of Diagonal branch 10-03, last cath Sept 08- 2 vessel disease w/ patent  stents  . Lupus erythematosus   . Hypothyroidism     Past Surgical History  Procedure Date  . Plastic surgical repair      dog bite to leg  . Splenectomy 5-04  . Cholecystectomy, laparoscopic 2010    Family History  Problem Relation Age of Onset  . Cancer Mother     breast and cervical  . Paget's disease of bone Mother   . Fibromyalgia Mother   . Cancer Paternal Grandfather     colon  . Heart attack Other   . Coronary artery disease Other   . Diabetes Other   . Hypertension Other   . Hyperlipidemia Other   . Hypertension Father   . Heart disease Father     PVD  . GER disease Father     History  Substance Use Topics  . Smoking status: Former Smoker -- 0.5 packs/day for 30 years    Types: Cigarettes    Quit date: 12/23/2011  . Smokeless tobacco: Never Used  . Alcohol Use: No    OB History    Grav Para Term Preterm Abortions TAB SAB Ect Mult Living                  Review of Systems  All other systems reviewed and are negative.    Allergies  Review of patient's allergies indicates no known allergies.  Home Medications   Current Outpatient Rx  Name  Route  Sig  Dispense  Refill  . ASPIRIN 81 MG PO CHEW   Oral  Chew 81 mg by mouth every morning.          . ATENOLOL 25 MG PO TABS   Oral   Take 25 mg by mouth every morning.          . FUROSEMIDE 40 MG PO TABS   Oral   Take 40 mg by mouth every morning.         Marland Kitchen LEVOTHYROXINE SODIUM 50 MCG PO TABS   Oral   Take 50 mcg by mouth every morning.          Marland Kitchen MYCOPHENOLATE MOFETIL 250 MG PO CAPS   Oral   Take 250-500 mg by mouth 2 (two) times daily. Take 2 capsules in the morning and 1 capsule at night.         Marland Kitchen PANTOPRAZOLE SODIUM 40 MG PO TBEC   Oral   Take 40 mg by mouth every morning.          Marland Kitchen RAMIPRIL 1.25 MG PO CAPS   Oral   Take 1.25 mg by mouth every morning.          Marland Kitchen HYDROCODONE-ACETAMINOPHEN 5-325 MG PO TABS   Oral   Take 1 tablet by mouth every 6 (six) hours as  needed for pain.   60 tablet   1   . LEVOFLOXACIN 500 MG PO TABS   Oral   Take 1 tablet (500 mg total) by mouth daily.   7 tablet   0   . NITROGLYCERIN 0.4 MG SL SUBL   Sublingual   Place 0.4 mg under the tongue every 5 (five) minutes x 3 doses as needed. For chest pain         . ROSUVASTATIN CALCIUM 20 MG PO TABS   Oral   Take 1 tablet (20 mg total) by mouth at bedtime.   30 tablet   11     BP 123/88  Pulse 98  Temp 100.8 F (38.2 C) (Oral)  Resp 24  SpO2 98%  Physical Exam  Nursing note and vitals reviewed. Constitutional: She appears well-developed and well-nourished. No distress.  HENT:  Head: Normocephalic and atraumatic.  Mouth/Throat: Oropharynx is clear and moist. No oropharyngeal exudate.       Mild tenderness to palpation over the right maxillary sinus, nasal passages with mucopurulent purulent material, oropharynx is clear, mucous membranes are moist  Eyes: Conjunctivae normal and EOM are normal. Pupils are equal, round, and reactive to light. Right eye exhibits no discharge. Left eye exhibits no discharge. No scleral icterus.  Neck: Normal range of motion. Neck supple. No JVD present. No thyromegaly present.       Very supple neck, no lymphadenopathy  Cardiovascular: Normal rate, regular rhythm, normal heart sounds and intact distal pulses.  Exam reveals no gallop and no friction rub.   No murmur heard. Pulmonary/Chest: Effort normal and breath sounds normal. No respiratory distress. She has no wheezes. She has no rales.  Abdominal: Soft. Bowel sounds are normal. She exhibits no distension and no mass. There is no tenderness.       Minimal upper abdominal tenderness located in the very lateral right upper quadrant, no other tenderness, no peritonitis or guarding  Musculoskeletal: Normal range of motion. She exhibits edema (bilateral 2+ pitting edema of the lower extremities). She exhibits no tenderness.  Lymphadenopathy:    She has no cervical adenopathy.   Neurological: She is alert. Coordination normal.       Speech is clear, moves all extremities, normal mental  status, normal memory  Skin: Skin is warm and dry. No rash noted. No erythema.       No rashes  Psychiatric: She has a normal mood and affect. Her behavior is normal.    ED Course  Procedures (including critical care time)  Labs Reviewed  COMPREHENSIVE METABOLIC PANEL - Abnormal; Notable for the following:    Sodium 132 (*)     Creatinine, Ser 1.42 (*)     Calcium 8.2 (*)     Total Protein 5.6 (*)     Albumin 1.3 (*)     ALT 40 (*)     Alkaline Phosphatase 137 (*)     Total Bilirubin 0.1 (*)     GFR calc non Af Amer 40 (*)     GFR calc Af Amer 47 (*)     All other components within normal limits  URINALYSIS, ROUTINE W REFLEX MICROSCOPIC - Abnormal; Notable for the following:    APPearance HAZY (*)     Hgb urine dipstick TRACE (*)     Protein, ur >300 (*)     Leukocytes, UA TRACE (*)     All other components within normal limits  CBC WITH DIFFERENTIAL - Abnormal; Notable for the following:    RBC 3.14 (*)     Hemoglobin 10.1 (*)     HCT 30.1 (*)     RDW 16.1 (*)     Monocytes Relative 13 (*)     Monocytes Absolute 1.1 (*)     All other components within normal limits  URINE MICROSCOPIC-ADD ON - Abnormal; Notable for the following:    Squamous Epithelial / LPF FEW (*)     All other components within normal limits  URINE CULTURE   Dg Chest 2 View  03/10/2012  *RADIOLOGY REPORT*  Clinical Data: Fever, lupus.  CHEST - 2 VIEW  Comparison: 03/07/2012  Findings: Right lung base opacity. Interval improved aeration of the left lung base.  Elevated right hemidiaphragm.  Heart size upper normal.  Mediastinal contours otherwise within normal range. Trace right pleural effusion not excluded.  No pneumothorax.  No acute osseous finding.  IMPRESSION: Right lung base opacity; atelectasis versus pneumonia.   Original Report Authenticated By: Jearld Lesch, M.D.    Mr Thoracic  Spine Wo Contrast  03/09/2012  *RADIOLOGY REPORT*  Clinical Data: Intractable right flank pain radiating to the right shoulder.  MRI THORACIC SPINE WITHOUT CONTRAST  Technique:  Multiplanar and multiecho pulse sequences of the thoracic spine were obtained without intravenous contrast.  Comparison: Chest CT dated 05/28/2005  Findings: The discs throughout the thoracic spine are normal. There is no spinal or foraminal stenosis.  No facet arthritis. Paraspinal soft tissues are normal. Tiny bilateral pleural effusions, unchanged.  Thoracic spinal cord is normal.  IMPRESSION: Normal MRI of the thoracic spine.   Original Report Authenticated By: Francene Boyers, M.D.      1. Sinusitis   2. Anemia   3. Proteinuria   4. Fever       MDM  The etiology of the patient's flank pain is undetermined, I do not think that she needs further evaluation with CT scan to or other significant imaging at this time as it has been very thoroughly evaluated prior to this visit. Her pain in the right upper quadrant is also been a chronic problem for her over the last month. She has a very low-grade fever, she has signs of sinusitis but no other obvious infections on exam. Her urine  culture from her recent admission was negative, chest x-ray showed atelectasis, will repeat chest x-ray at this time, Levaquin, reevaluate. The patient is very well-appearing, does not appear to be in acute distress.   The patient has been reevaluated, she appears clinically stable, she is not tachycardic, has no difficulty breathing and no respiratory symptoms whatsoever. I have personally interpreted her chest x-ray and find her to be a very slight atelectasis versus infiltrate at the right lung base. Since this is the location of her pain, she will need antibiotics. This will also treat sinusitis. Levaquin for 7 days, pain is almost totally improved and the patient has hydrocodone at home which she has not yet filled since being discharged. She  feels comfortable with this regimen, she will return should her symptoms worsen. The rest of her laboratory workup was overall unremarkable and unchanged from prior labs.     Vida Roller, MD 03/10/12 [phone]

## 2012-03-09 NOTE — ED Notes (Signed)
Pt states discharged this morning; fever returned today; continues with vomiting and severe right flank/abd/side pain; refused tylenol/motrin in triage; states wants to treat the cause and not the symptoms

## 2012-03-09 NOTE — Progress Notes (Signed)
Subjective: Patient has had continued pain but has been controlled best with toradol. MRI was done last PM - reading pending.  Objective: Lab: Lab Results  Component Value Date   WBC 9.2 03/08/2012   HGB 9.4* 03/08/2012   HCT 28.2* 03/08/2012   MCV 95.9 03/08/2012   PLT 186 03/08/2012   BMET    Component Value Date/Time   NA 137 03/09/2012 0506   K 4.2 03/09/2012 0506   CL 102 03/09/2012 0506   CO2 27 03/09/2012 0506   GLUCOSE 89 03/09/2012 0506   BUN 27* 03/09/2012 0506   CREATININE 1.54* 03/09/2012 0506   CREATININE TEST NOT PERFORMED 09/10/2011 1150   CALCIUM 7.6* 03/09/2012 0506   GFRNONAA 37* 03/09/2012 0506   GFRAA 42* 03/09/2012 0506     Imaging: MRI thoracic spine normal  Scheduled Meds:   . aspirin  81 mg Oral Daily  . atenolol  25 mg Oral Daily  . atorvastatin  80 mg Oral q1800  . furosemide  40 mg Intravenous Q8H  . heparin  5,000 Units Subcutaneous Q8H  . levothyroxine  50 mcg Oral QAC breakfast  . mycophenolate  250 mg Oral QHS  . mycophenolate  500 mg Oral Daily  . pantoprazole  40 mg Oral Daily  . predniSONE  10 mg Oral q morning - 10a  . ramipril  1.25 mg Oral Daily  . sodium chloride  3 mL Intravenous Q12H   Continuous Infusions:  PRN Meds:.sodium chloride, HYDROmorphone (DILAUDID) injection, sodium chloride   Physical Exam: Filed Vitals:   03/09/12 0640  BP: 127/84  Pulse: 74  Temp: 98.1 F (36.7 C)  Resp: 18  see d/c       Assessment/Plan: Stable and ready for d/c home Dictated # [phone]   [facility] Reg[facility] (o) [phone]; (c) [phone] Call-grp - [facility]  Tele: [phone]  03/09/2012, 8:26 AM

## 2012-03-09 NOTE — Discharge Summary (Signed)
NAMEBEVERLIE, KURIHARA               ACCOUNT NO.:  [id number]  MEDICAL RECORD NO.:  [id number]  LOCATION:  1423                         FACILITY:  [facility]  PHYSICIAN:  Rosalyn Gess. Irvin Bastin, MD  DATE OF BIRTH:  1955/12/14  DATE OF ADMISSION:  03/07/2012 DATE OF DISCHARGE:  03/09/2012                              DISCHARGE SUMMARY   ADMITTING DIAGNOSES: 1. Refractory flank pain. 2. Lupus erythematosus. 3. Lupus nephritis. 4. Hypertension 5. Fluid retention.  DISCHARGE DIAGNOSES: 1. Flank pain of unknown origin. 2. Refractory flank pain. 3. Lupus erythematosus. 4. Lupus nephritis. 5. Hypertension 6. Fluid retention.  CONSULTANTS:  None.  PROCEDURES: 1. CT of the abdomen and pelvis performed as an outpatient March 04, 2012 for acute flank pain, which revealed ascites, mesenteric     edema, and anasarca concerning for fluid overload state.  No     evidence for renal calculi or obstructive uropathy.  Prior     splenectomy.  Cannot rule out bilateral femoral head avascular     necrosis. 2. A 2-view chest x-ray performed the day of admission, which showed     shallow inspirations with new infiltration versus atelectasis at     the left lung base. Renal ultrasound performed December 15, which showed no evidence of hydronephrosis.  Left renal parenchymal scarring.  No other renal parenchymal abnormality identified.  Mild right perinephric fluid and minimal ascites. 1. MRI of the thoracic spine, which was read out as showing a normal     thoracic spine.  Disks were normal.  There is no spinal or     foraminal stenosis.  No facet arthritis.  Paraspinal soft tissues     were normal.  Thoracic spinal cord was normal.  HISTORY OF PRESENT ILLNESS:  Ms. Sullivant is a 56 year old African American woman who is followed by Dr. Hyman Hopes for lupus nephritis and by Dr. Zenovia Jordan for lupus erythematosus.  She is also followed by Dr. Clifton James for Cardiology.  The patient presented to the  office on December 12 reporting several days of severe flank pain, which she rates as 10/10.  The pain would wax and wane and would radiate around to the right lower abdomen.  The patient had denied any dysuria.  She noticed no frank hematuria.  She has had no fevers, sweats, or chills.  The patient is on CellCept and prednisone for her lupus.  Concern was for acute nephrolithiasis and she was sent for urinalysis which returned has unremarkable and CT scan of the abdomen and pelvis, which was negative. The patient continued to have discomfort and on Saturday called with severe unresolved pain.  She was seen in the emergency department and subsequently admitted.  Please see the H and P for past medical history, family history, social history, and admission examination.  HOSPITAL COURSE: 1. Flank pain.  The patient with refractory flank pain.  She did have     a renal ultrasound which was negative.  The patient did get     adequate control with Toradol 30 mg IV q.8 but dosing was limited     due to high renal toxicity.  The patient did come to MRI  of the     thoracic spine which was unremarkable with no evidence of any     spinal infection.  The patient is being continued to be controlled     with Toradol and she reports there is a low underlying amount of     discomfort.  The patient's evaluation was otherwise negative with     no source of her pain being identified.  Of note, the patient had a     normal CBC.  She has had no fevers. 2. At this point with no further testing indicated, the patient is     stable for discharge home.  We will control her pain with oral     narcotics using hydrocodone 5/325. 3. Fluid overload.  The patient has fluid retention related to her     steroid use.  She had a prior hospitalization for this.  During     this hospital stay, she was given IV Lasix.  Unfortunately the     combination of Dilaudid and diuresis caused her to have a transient     episode  of hypotension which resolved with fluid bolus.  The     patient did actually diurese 1200 mL of fluid this admission.  She     will continue at home on current dose of furosemide. 4. Rheumatology.  The patient is continuing on low-dose CellCept and     prednisone.  She will continue to follow with Dr. Nickola Major who will     manage this problem. 5. With the patient having no obvious source of her severe flank pain     with no source of infection or other serious medical condition, at     this time she is ready for discharge to home.  DISCHARGE PHYSICAL EXAMINATION:  VITAL SIGNS:  Temperature was 98.1, blood pressure 127/84, pulse 74, respirations 18, O2 sats 98% on room air. GENERAL APPEARANCE:  This is an obese African American woman in no acute distress. HEENT:  Conjunctivae and sclerae were clear. NECK:  Supple. CHEST:  The patient is moving air well with no rales, wheezes, or rhonchi.  No increased work of breathing. CARDIOVASCULAR:  The patient had a quiet precordium.  Her heart rate was regular.  I appreciated no murmurs. ABDOMEN:  Obese, soft, with positive bowel sounds.  No guarding or rebound.  No tenderness to palpation. EXTREMITIES:  Without clubbing, cyanosis, edema, or deformity. NEURO:  The patient is awake, alert.  She is oriented to person, place, time and context.  FINAL LABORATORY:  From December 17, BMET with a sodium of 137, potassium 4.2, chloride 102, CO2 27, BUN of 27, creatinine 1.54, glucose was 89.  The patient had troponins that were negative x3.  Lipid panel December 15 with total cholesterol of 358, triglycerides 145, HDL 77, LDL of 252.  CBC from December 16 with a white count of 9200, hemoglobin of 9.4 g down from 11.3 g on the 15th.  Platelet count was 186,000.  Sed rate was 90.  With the patient being stable with no sign of obvious bleeding and question the patient closely __________ with her pain being reasonably controlled with no obvious source of  infection that needs any additional treatment, she will be discharged to home with close followup.  DISCHARGE MEDICATIONS:  The patient will continue on her prior medications.  We will resume Crestor 20 mg daily.  We will provide a prescription for Vicodin 5/325 every 6 hours as needed for pain.  FOLLOWUP:  I will see the patient in the office in 1 week.  She will follow up with Dr. Hyman Hopes and Dr. Nickola Major as previously instructed.  The patient's condition at time of discharge dictation is guarded with no known source of her flank pain and her other comorbidities.     Rosalyn Gess Cabrina Shiroma, MD     MEN/MEDQ  D:  03/09/2012  T:  03/09/2012  Job:  [id number]  cc:   Garnetta Buddy, M.D. Fax: [phone]  Zenovia Jordan, MD Fax: [phone]

## 2012-03-10 DIAGNOSIS — Z79899 Other long term (current) drug therapy: Secondary | ICD-10-CM | POA: Diagnosis not present

## 2012-03-10 LAB — URINALYSIS, ROUTINE W REFLEX MICROSCOPIC
Bilirubin Urine: NEGATIVE
Glucose, UA: NEGATIVE mg/dL
Ketones, ur: NEGATIVE mg/dL
Nitrite: NEGATIVE
Protein, ur: >300 mg/dL — AB
Specific Gravity, Urine: 1.024 (ref 1.005–1.030)
Urobilinogen, UA: 0.2 mg/dL (ref 0.0–1.0)
pH: 7.5 (ref 5.0–8.0)

## 2012-03-10 LAB — URINE MICROSCOPIC-ADD ON

## 2012-03-10 LAB — CBC WITH DIFFERENTIAL/PLATELET
Basophils Absolute: 0 10*3/uL (ref 0.0–0.1)
Basophils Relative: 0 % (ref 0–1)
Eosinophils Absolute: 0 10*3/uL (ref 0.0–0.7)
Eosinophils Relative: 0 % (ref 0–5)
HCT: 30.1 % — ABNORMAL LOW (ref 36.0–46.0)
Hemoglobin: 10.1 g/dL — ABNORMAL LOW (ref 12.0–15.0)
Lymphocytes Relative: 17 % (ref 12–46)
Lymphs Abs: 1.4 10*3/uL (ref 0.7–4.0)
MCH: 32.2 pg (ref 26.0–34.0)
MCHC: 33.6 g/dL (ref 30.0–36.0)
MCV: 95.9 fL (ref 78.0–100.0)
Monocytes Absolute: 1.1 10*3/uL — ABNORMAL HIGH (ref 0.1–1.0)
Monocytes Relative: 13 % — ABNORMAL HIGH (ref 3–12)
Neutro Abs: 5.6 10*3/uL (ref 1.7–7.7)
Neutrophils Relative %: 69 % (ref 43–77)
Platelets: 281 10*3/uL (ref 150–400)
RBC: 3.14 MIL/uL — ABNORMAL LOW (ref 3.87–5.11)
RDW: 16.1 % — ABNORMAL HIGH (ref 11.5–15.5)
WBC: 8.1 10*3/uL (ref 4.0–10.5)

## 2012-03-10 MED ORDER — LEVOFLOXACIN 500 MG PO TABS: 500.0000 mg | ORAL_TABLET | Freq: Every day | ORAL | Status: DC

## 2012-03-11 ENCOUNTER — Telehealth: Payer: Self-pay | Admitting: Internal Medicine

## 2012-03-11 LAB — URINE CULTURE
Colony Count: NO GROWTH
Culture: NO GROWTH

## 2012-03-11 MED ORDER — ONDANSETRON HCL 4 MG PO TABS: 4.0000 mg | ORAL_TABLET | Freq: Three times a day (TID) | ORAL | Status: DC | PRN

## 2012-03-11 NOTE — Telephone Encounter (Signed)
Patient Information:  Caller Name: Mairi  Phone: [phone]  Patient: [name], [name]  Gender: Female  DOB: 30-Mar-1955  Age: 56 Years  PCP: Illene Regulus (Adults only)  Office Follow Up:  Does the office need to follow up with this patient?: Yes  Instructions For The Office: Vomiting since starting medication.  Patient states allergy to Percocet which is not listed on MAR.  She is taking Norco 5/325mg  and Levaquin. Please advise.  RN Note:  Advised patient to hold the Levaquin until Physician is consulted. Home care instructions given. Understanding expressed.  Symptoms  Reason For Call & Symptoms: Patient states she was discharged from [facility] this week for Chronic pain. She returned to the ER on same the night and diagnosed with Sinusitis and Rt sided pneumonia.Marland Kitchen  She was placed on Levaquin .Marland Kitchen She states she began vomiting yesterday and today.  Unsure if medication related.  Unsure if it occurs after taking Levaquin.  Last emesis 1 hour ago. x4 thus far today.. Sipping on fluids  Reviewed Health History In EMR: Yes  Reviewed Medications In EMR: Yes  Reviewed Allergies In EMR: Yes  Reviewed Surgeries / Procedures: Yes  Date of Onset of Symptoms: 03/10/2012  Guideline(s) Used:  Vomiting  Disposition Per Guideline:   Callback by PCP Today  Reason For Disposition Reached:   Vomiting a prescription medication  Advice Given:  Reassurance:  Vomiting can be caused by many types of illnesses. It can be caused by a stomach flu virus. It can be caused by eating or drinking something that disagreed with your stomach.  Adults with vomiting need to stay hydrated. This is the most important thing. If you don't drink and replace lost fluids, you may get dehydrated.  You can treat vomiting, even if there is mild dehydration, at home.  Here is some care advice that should help.  Clear Liquids:  Sip water or a rehydration drink (e.g., Gatorade or Powerade).  Other options:  1/2 strength flat lemon-lime soda or ginger ale.  After 4 hours without vomiting, increase the amount.  Avoid Nonprescription Medicines:  Stop all nonprescription medicines for 24 hours (Reason: they may make vomiting worse).  Call if vomiting a prescription medicine.  Call Back If:  Vomiting lasts for more than 2 days (48 hours)  Signs of dehydration occur  You become worse.  Call Back If:  Vomiting lasts for more than 2 days (48 hours)  Signs of dehydration occur  You have more questions  You become worse.

## 2012-03-11 NOTE — Telephone Encounter (Signed)
Called patient - she is having N/V  Plan Take the levaquin  Zofran 4 mg q 8 - Rx sent

## 2012-03-11 NOTE — Telephone Encounter (Signed)
Pt would like Dr. Debby Bud to call her.

## 2012-03-12 DIAGNOSIS — D649 Anemia, unspecified: Secondary | ICD-10-CM | POA: Diagnosis not present

## 2012-03-12 DIAGNOSIS — N183 Chronic kidney disease, stage 3 unspecified: Secondary | ICD-10-CM | POA: Diagnosis not present

## 2012-03-12 DIAGNOSIS — N390 Urinary tract infection, site not specified: Secondary | ICD-10-CM | POA: Diagnosis not present

## 2012-03-12 DIAGNOSIS — I10 Essential (primary) hypertension: Secondary | ICD-10-CM | POA: Diagnosis not present

## 2012-03-12 DIAGNOSIS — I129 Hypertensive chronic kidney disease with stage 1 through stage 4 chronic kidney disease, or unspecified chronic kidney disease: Secondary | ICD-10-CM | POA: Diagnosis not present

## 2012-03-23 ENCOUNTER — Other Ambulatory Visit: Payer: Self-pay | Admitting: Cardiovascular Disease

## 2012-03-25 DIAGNOSIS — M329 Systemic lupus erythematosus, unspecified: Secondary | ICD-10-CM | POA: Diagnosis not present

## 2012-03-31 DIAGNOSIS — M329 Systemic lupus erythematosus, unspecified: Secondary | ICD-10-CM | POA: Diagnosis not present

## 2012-03-31 DIAGNOSIS — R609 Edema, unspecified: Secondary | ICD-10-CM | POA: Diagnosis not present

## 2012-03-31 DIAGNOSIS — Z79899 Other long term (current) drug therapy: Secondary | ICD-10-CM | POA: Diagnosis not present

## 2012-04-16 DIAGNOSIS — M329 Systemic lupus erythematosus, unspecified: Secondary | ICD-10-CM | POA: Diagnosis not present

## 2012-04-23 ENCOUNTER — Encounter (HOSPITAL_COMMUNITY): Payer: Medicare Other

## 2012-05-03 ENCOUNTER — Other Ambulatory Visit (HOSPITAL_COMMUNITY): Payer: Self-pay | Admitting: Rheumatology

## 2012-05-03 ENCOUNTER — Other Ambulatory Visit (HOSPITAL_COMMUNITY): Payer: Self-pay | Admitting: *Deleted

## 2012-05-07 ENCOUNTER — Encounter (HOSPITAL_COMMUNITY)
Admission: RE | Admit: 2012-05-07 | Discharge: 2012-05-07 | Disposition: A | Discharge status: Home / Self Care | Payer: Medicare Other | Source: Ambulatory Visit | Attending: Rheumatology | Admitting: Rheumatology

## 2012-05-07 ENCOUNTER — Encounter (HOSPITAL_COMMUNITY): Payer: Self-pay

## 2012-05-07 DIAGNOSIS — N058 Unspecified nephritic syndrome with other morphologic changes: Secondary | ICD-10-CM | POA: Diagnosis not present

## 2012-05-07 DIAGNOSIS — M329 Systemic lupus erythematosus, unspecified: Secondary | ICD-10-CM | POA: Diagnosis not present

## 2012-05-07 MED ORDER — SODIUM CHLORIDE 0.9 % IV SOLN
Freq: Once | INTRAVENOUS | Status: AC
  Administered 2012-05-07: 08:00:00 via INTRAVENOUS

## 2012-05-07 MED ORDER — SODIUM CHLORIDE 0.9 % IV SOLN
750.0000 mg/m2 | Freq: Once | INTRAVENOUS | Status: AC
  Administered 2012-05-07: 1620 mg via INTRAVENOUS
  Filled 2012-05-07: qty 81

## 2012-05-07 MED ORDER — ONDANSETRON HCL 8 MG PO TABS
8.0000 mg | ORAL_TABLET | Freq: Once | ORAL | Status: AC
  Administered 2012-05-07: 8 mg via ORAL
  Filled 2012-05-07: qty 1

## 2012-05-07 MED ORDER — DIPHENHYDRAMINE HCL 25 MG PO TABS
25.0000 mg | ORAL_TABLET | Freq: Once | ORAL | Status: DC
  Filled 2012-05-07: qty 1

## 2012-05-07 MED ORDER — DIPHENHYDRAMINE HCL 25 MG PO CAPS
25.0000 mg | ORAL_CAPSULE | Freq: Once | ORAL | Status: AC
  Administered 2012-05-07: 25 mg via ORAL
  Filled 2012-05-07: qty 1

## 2012-05-19 DIAGNOSIS — M329 Systemic lupus erythematosus, unspecified: Secondary | ICD-10-CM | POA: Diagnosis not present

## 2012-05-19 DIAGNOSIS — M2550 Pain in unspecified joint: Secondary | ICD-10-CM | POA: Diagnosis not present

## 2012-05-19 DIAGNOSIS — R609 Edema, unspecified: Secondary | ICD-10-CM | POA: Diagnosis not present

## 2012-05-24 ENCOUNTER — Other Ambulatory Visit: Payer: Self-pay | Admitting: Cardiovascular Disease

## 2012-06-06 ENCOUNTER — Other Ambulatory Visit: Payer: Self-pay | Admitting: Cardiovascular Disease

## 2012-06-08 ENCOUNTER — Other Ambulatory Visit: Payer: Self-pay | Admitting: *Deleted

## 2012-06-08 MED ORDER — PANTOPRAZOLE SODIUM 40 MG PO TBEC: 40.0000 mg | DELAYED_RELEASE_TABLET | Freq: Every morning | ORAL | Status: DC

## 2012-06-10 NOTE — Progress Notes (Signed)
Obtained recent lab work from Dr Nickola Major office (CBC with diff, CMET). Called Main pharmacy to notify them that cytoxan to be given in Short Stay on 06/11/12. Spoke with Marda Stalker, pharmacist. Copy of orders and lab work sent to pharmacy.

## 2012-06-11 ENCOUNTER — Encounter (HOSPITAL_COMMUNITY)
Admission: RE | Admit: 2012-06-11 | Discharge: 2012-06-11 | Disposition: A | Discharge status: Home / Self Care | Payer: Medicare Other | Source: Ambulatory Visit | Attending: Rheumatology | Admitting: Rheumatology

## 2012-06-11 ENCOUNTER — Encounter (HOSPITAL_COMMUNITY): Payer: Self-pay

## 2012-06-11 DIAGNOSIS — N058 Unspecified nephritic syndrome with other morphologic changes: Secondary | ICD-10-CM | POA: Insufficient documentation

## 2012-06-11 DIAGNOSIS — M329 Systemic lupus erythematosus, unspecified: Secondary | ICD-10-CM | POA: Insufficient documentation

## 2012-06-11 MED ORDER — SODIUM CHLORIDE 0.9 % IV SOLN
750.0000 mg/m2 | Freq: Once | INTRAVENOUS | Status: AC
  Administered 2012-06-11: 1600 mg via INTRAVENOUS
  Filled 2012-06-11: qty 80

## 2012-06-11 MED ORDER — SODIUM CHLORIDE 0.9 % IV SOLN
INTRAVENOUS | Status: AC
  Administered 2012-06-11: 500 mL via INTRAVENOUS

## 2012-06-11 MED ORDER — DIPHENHYDRAMINE HCL 25 MG PO CAPS
25.0000 mg | ORAL_CAPSULE | Freq: Once | ORAL | Status: AC
  Administered 2012-06-11: 25 mg via ORAL
  Filled 2012-06-11: qty 1

## 2012-06-11 MED ORDER — ONDANSETRON HCL 8 MG PO TABS
8.0000 mg | ORAL_TABLET | Freq: Once | ORAL | Status: AC
  Administered 2012-06-11: 8 mg via ORAL
  Filled 2012-06-11: qty 1

## 2012-06-14 DIAGNOSIS — I129 Hypertensive chronic kidney disease with stage 1 through stage 4 chronic kidney disease, or unspecified chronic kidney disease: Secondary | ICD-10-CM | POA: Diagnosis not present

## 2012-06-14 DIAGNOSIS — D649 Anemia, unspecified: Secondary | ICD-10-CM | POA: Diagnosis not present

## 2012-06-14 DIAGNOSIS — N2581 Secondary hyperparathyroidism of renal origin: Secondary | ICD-10-CM | POA: Diagnosis not present

## 2012-06-14 DIAGNOSIS — D509 Iron deficiency anemia, unspecified: Secondary | ICD-10-CM | POA: Diagnosis not present

## 2012-06-14 DIAGNOSIS — N183 Chronic kidney disease, stage 3 unspecified: Secondary | ICD-10-CM | POA: Diagnosis not present

## 2012-06-21 DIAGNOSIS — M329 Systemic lupus erythematosus, unspecified: Secondary | ICD-10-CM | POA: Diagnosis not present

## 2012-06-23 DIAGNOSIS — R110 Nausea: Secondary | ICD-10-CM | POA: Diagnosis not present

## 2012-06-23 DIAGNOSIS — Z79899 Other long term (current) drug therapy: Secondary | ICD-10-CM | POA: Diagnosis not present

## 2012-06-23 DIAGNOSIS — M2550 Pain in unspecified joint: Secondary | ICD-10-CM | POA: Diagnosis not present

## 2012-06-23 DIAGNOSIS — M329 Systemic lupus erythematosus, unspecified: Secondary | ICD-10-CM | POA: Diagnosis not present

## 2012-06-24 ENCOUNTER — Other Ambulatory Visit (HOSPITAL_COMMUNITY): Payer: Self-pay | Admitting: Rheumatology

## 2012-07-07 ENCOUNTER — Other Ambulatory Visit (HOSPITAL_COMMUNITY): Payer: Self-pay | Admitting: Rheumatology

## 2012-07-08 ENCOUNTER — Other Ambulatory Visit: Payer: Self-pay | Admitting: Cardiovascular Disease

## 2012-07-08 ENCOUNTER — Other Ambulatory Visit: Payer: Self-pay | Admitting: Internal Medicine

## 2012-07-08 ENCOUNTER — Other Ambulatory Visit: Payer: Self-pay | Admitting: *Deleted

## 2012-07-08 MED ORDER — FUROSEMIDE 40 MG PO TABS: 40.0000 mg | ORAL_TABLET | Freq: Every morning | ORAL | Status: DC

## 2012-07-09 ENCOUNTER — Encounter (HOSPITAL_COMMUNITY): Payer: Self-pay

## 2012-07-09 ENCOUNTER — Encounter (HOSPITAL_COMMUNITY)
Admission: RE | Admit: 2012-07-09 | Discharge: 2012-07-09 | Disposition: A | Discharge status: Home / Self Care | Payer: Medicare Other | Source: Ambulatory Visit | Attending: Rheumatology | Admitting: Rheumatology

## 2012-07-09 DIAGNOSIS — M329 Systemic lupus erythematosus, unspecified: Secondary | ICD-10-CM | POA: Diagnosis not present

## 2012-07-09 DIAGNOSIS — N058 Unspecified nephritic syndrome with other morphologic changes: Secondary | ICD-10-CM | POA: Insufficient documentation

## 2012-07-09 MED ORDER — SODIUM CHLORIDE 0.9 % IV SOLN
Freq: Once | INTRAVENOUS | Status: AC
  Administered 2012-07-09: 07:00:00 via INTRAVENOUS

## 2012-07-09 MED ORDER — ONDANSETRON HCL 8 MG PO TABS: 8.0000 mg | ORAL_TABLET | Freq: Once | ORAL | Status: DC

## 2012-07-09 MED ORDER — ONDANSETRON HCL 8 MG PO TABS
8.0000 mg | ORAL_TABLET | Freq: Once | ORAL | Status: AC
  Administered 2012-07-09: 8 mg via ORAL
  Filled 2012-07-09: qty 1

## 2012-07-09 MED ORDER — DIPHENHYDRAMINE HCL 25 MG PO TABS
25.0000 mg | ORAL_TABLET | Freq: Once | ORAL | Status: DC
Start: 2012-07-09 — End: 2012-07-09
  Filled 2012-07-09: qty 1

## 2012-07-09 MED ORDER — DIPHENHYDRAMINE HCL 25 MG PO CAPS
25.0000 mg | ORAL_CAPSULE | Freq: Once | ORAL | Status: AC
  Administered 2012-07-09: 25 mg via ORAL
  Filled 2012-07-09: qty 1

## 2012-07-09 MED ORDER — SODIUM CHLORIDE 0.9 % IV SOLN
500.0000 mg/m2 | Freq: Once | INTRAVENOUS | Status: AC
  Administered 2012-07-09: 1080 mg via INTRAVENOUS
  Filled 2012-07-09: qty 54

## 2012-07-09 NOTE — Progress Notes (Signed)
Cytoxan completed. Saline to flush. Pt tolerated well

## 2012-07-19 DIAGNOSIS — M329 Systemic lupus erythematosus, unspecified: Secondary | ICD-10-CM | POA: Diagnosis not present

## 2012-07-30 DIAGNOSIS — M2550 Pain in unspecified joint: Secondary | ICD-10-CM | POA: Diagnosis not present

## 2012-07-30 DIAGNOSIS — M329 Systemic lupus erythematosus, unspecified: Secondary | ICD-10-CM | POA: Diagnosis not present

## 2012-07-30 DIAGNOSIS — R350 Frequency of micturition: Secondary | ICD-10-CM | POA: Diagnosis not present

## 2012-07-30 DIAGNOSIS — Z79899 Other long term (current) drug therapy: Secondary | ICD-10-CM | POA: Diagnosis not present

## 2012-08-06 ENCOUNTER — Encounter (HOSPITAL_COMMUNITY)
Admission: RE | Admit: 2012-08-06 | Discharge: 2012-08-06 | Disposition: A | Discharge status: Home / Self Care | Payer: Medicare Other | Source: Ambulatory Visit | Attending: Rheumatology | Admitting: Rheumatology

## 2012-08-06 ENCOUNTER — Other Ambulatory Visit (HOSPITAL_COMMUNITY): Payer: Self-pay | Admitting: Rheumatology

## 2012-08-06 ENCOUNTER — Encounter (HOSPITAL_COMMUNITY): Payer: Self-pay

## 2012-08-06 DIAGNOSIS — M329 Systemic lupus erythematosus, unspecified: Secondary | ICD-10-CM | POA: Diagnosis not present

## 2012-08-06 DIAGNOSIS — N058 Unspecified nephritic syndrome with other morphologic changes: Secondary | ICD-10-CM | POA: Insufficient documentation

## 2012-08-06 MED ORDER — ONDANSETRON HCL 8 MG PO TABS
8.0000 mg | ORAL_TABLET | Freq: Once | ORAL | Status: AC
  Administered 2012-08-06: 8 mg via ORAL
  Filled 2012-08-06: qty 1

## 2012-08-06 MED ORDER — SODIUM CHLORIDE 0.9 % IV SOLN
500.0000 mg/m2 | Freq: Once | INTRAVENOUS | Status: AC
  Administered 2012-08-06: 1080 mg via INTRAVENOUS
  Filled 2012-08-06: qty 54

## 2012-08-06 MED ORDER — DIPHENHYDRAMINE HCL 25 MG PO TABS
25.0000 mg | ORAL_TABLET | Freq: Once | ORAL | Status: AC
  Administered 2012-08-06: 25 mg via ORAL
  Filled 2012-08-06 (×2): qty 1

## 2012-08-06 MED ORDER — SODIUM CHLORIDE 0.9 % IV SOLN
Freq: Once | INTRAVENOUS | Status: AC
  Administered 2012-08-06: 1000 mL via INTRAVENOUS

## 2012-08-06 NOTE — Progress Notes (Signed)
Uneventful infusion of cyclophosphamide in Short Stay. Pt verbalizes understanding of instructions to drink lots of fluid today

## 2012-08-17 DIAGNOSIS — M329 Systemic lupus erythematosus, unspecified: Secondary | ICD-10-CM | POA: Diagnosis not present

## 2012-09-02 DIAGNOSIS — M2550 Pain in unspecified joint: Secondary | ICD-10-CM | POA: Diagnosis not present

## 2012-09-02 DIAGNOSIS — M329 Systemic lupus erythematosus, unspecified: Secondary | ICD-10-CM | POA: Diagnosis not present

## 2012-09-02 DIAGNOSIS — Z79899 Other long term (current) drug therapy: Secondary | ICD-10-CM | POA: Diagnosis not present

## 2012-09-03 ENCOUNTER — Encounter (HOSPITAL_COMMUNITY)
Admission: RE | Admit: 2012-09-03 | Discharge: 2012-09-03 | Disposition: A | Discharge status: Home / Self Care | Payer: Medicare Other | Source: Ambulatory Visit | Attending: Rheumatology | Admitting: Rheumatology

## 2012-09-03 ENCOUNTER — Other Ambulatory Visit (HOSPITAL_COMMUNITY): Payer: Self-pay | Admitting: Rheumatology

## 2012-09-03 ENCOUNTER — Encounter (HOSPITAL_COMMUNITY): Payer: Self-pay

## 2012-09-03 DIAGNOSIS — M329 Systemic lupus erythematosus, unspecified: Secondary | ICD-10-CM | POA: Insufficient documentation

## 2012-09-03 DIAGNOSIS — N058 Unspecified nephritic syndrome with other morphologic changes: Secondary | ICD-10-CM | POA: Insufficient documentation

## 2012-09-03 MED ORDER — DIPHENHYDRAMINE HCL 25 MG PO CAPS
25.0000 mg | ORAL_CAPSULE | Freq: Once | ORAL | Status: AC
  Administered 2012-09-03: 25 mg via ORAL
  Filled 2012-09-03: qty 1

## 2012-09-03 MED ORDER — SODIUM CHLORIDE 0.9 % IV SOLN
500.0000 mg/m2 | Freq: Once | INTRAVENOUS | Status: AC
  Administered 2012-09-03: 1080 mg via INTRAVENOUS
  Filled 2012-09-03: qty 54

## 2012-09-03 MED ORDER — DIPHENHYDRAMINE HCL 25 MG PO TABS: 25.0000 mg | ORAL_TABLET | Freq: Once | ORAL | Status: DC

## 2012-09-03 MED ORDER — ONDANSETRON HCL 8 MG PO TABS
8.0000 mg | ORAL_TABLET | Freq: Once | ORAL | Status: AC
  Administered 2012-09-03: 8 mg via ORAL
  Filled 2012-09-03: qty 1

## 2012-09-03 MED ORDER — SODIUM CHLORIDE 0.9 % IV SOLN
Freq: Once | INTRAVENOUS | Status: AC
  Administered 2012-09-03: 09:00:00 via INTRAVENOUS

## 2012-09-13 ENCOUNTER — Ambulatory Visit (INDEPENDENT_AMBULATORY_CARE_PROVIDER_SITE_OTHER): Payer: Medicare Other

## 2012-09-13 DIAGNOSIS — Z23 Encounter for immunization: Secondary | ICD-10-CM | POA: Diagnosis not present

## 2012-09-14 DIAGNOSIS — M329 Systemic lupus erythematosus, unspecified: Secondary | ICD-10-CM | POA: Diagnosis not present

## 2012-09-15 ENCOUNTER — Ambulatory Visit (INDEPENDENT_AMBULATORY_CARE_PROVIDER_SITE_OTHER): Payer: Medicare Other

## 2012-09-15 DIAGNOSIS — Z23 Encounter for immunization: Secondary | ICD-10-CM

## 2012-09-15 LAB — TB SKIN TEST
Induration: 0 mm
TB Skin Test: NEGATIVE

## 2012-09-17 LAB — TB SKIN TEST
Induration: 0 mm
TB Skin Test: NEGATIVE

## 2012-09-21 ENCOUNTER — Encounter: Payer: Self-pay | Admitting: *Deleted

## 2012-09-22 ENCOUNTER — Ambulatory Visit (INDEPENDENT_AMBULATORY_CARE_PROVIDER_SITE_OTHER): Payer: Medicare Other | Admitting: Cardiovascular Disease

## 2012-09-22 ENCOUNTER — Encounter: Payer: Self-pay | Admitting: Cardiovascular Disease

## 2012-09-22 VITALS — BP 90/78 | HR 60 | Ht 66.5 in | Wt 213.0 lb

## 2012-09-22 DIAGNOSIS — I2510 Atherosclerotic heart disease of native coronary artery without angina pectoris: Secondary | ICD-10-CM | POA: Diagnosis not present

## 2012-09-22 DIAGNOSIS — I059 Rheumatic mitral valve disease, unspecified: Secondary | ICD-10-CM | POA: Diagnosis not present

## 2012-09-22 DIAGNOSIS — I340 Nonrheumatic mitral (valve) insufficiency: Secondary | ICD-10-CM

## 2012-09-22 NOTE — Patient Instructions (Addendum)
Your physician wants you to follow-up in:  12 months.  You will receive a reminder letter in the mail two months in advance. If you don't receive a letter, please call our office to schedule the follow-up appointment.   

## 2012-09-22 NOTE — Progress Notes (Signed)
History of Present Illness: 57 yo AAF with h/o CAD s/p bare metal stent RCA and Circumflex 2003, PTCA Diagonal 2003, hypothyroidism, moderate MR, GERD, lupus here today for cardiac follow up. She had previously been followed in [facility] Cardiology clinic by Dr. Elsie Lincoln. I saw her as a new patient in November 2010. She has had pain in her back and in both legs at night. No exertional leg pain. Lower ext dopplers with no evidence of PAD. Most recent ABI May 2013 normal.  She has seen Dr. Loreta Ave with GI and had an upper endoscopy on September 13, 2010 which only showed hiatal hernia. Colonoscopy is reported as negative. Last echo 02/18/12 with normal LV size and function with mild MR. She was admitted to [facility] December 2013 with massive volume overload. She was followed by Nephrology. Her acute illness was felt to be due to lupus flare. She has been treated with Cytoxan. Doing much better. Tolerating statin.  She is here today for follow up. No chest pains or SOB. No swelling. She is feeling well. She is exercising every day. She is rarely smoking.   Primary Care Physician: Norins  Last Lipid Profile:Lipid Panel     Component Value Date/Time   CHOL 358* 03/07/2012 0350   TRIG 145 03/07/2012 0350   HDL 77 03/07/2012 0350   CHOLHDL 4.6 03/07/2012 0350   VLDL 29 03/07/2012 0350   LDLCALC 252* 03/07/2012 0350     Past Medical History  Diagnosis Date  . Peripheral vascular disease, unspecified   . Esophageal reflux   . Mitral valve insufficiency and aortic valve insufficiency   . Immune thrombocytopenic purpura   . Unspecified disease of pericardium   . Chronic ischemic heart disease, unspecified     w/ stents in mid circumflex and mid RCA in 03, and balloon angioplasty of Diagonal branch 10-03, last cath Sept 08- 2 vessel disease w/ patent stents  . Lupus erythematosus   . Hypothyroidism     Past Surgical History  Procedure Laterality Date  . Plastic surgical repair       dog bite to leg    . Splenectomy  5-04  . Cholecystectomy, laparoscopic  2010    Current Outpatient Prescriptions  Medication Sig Dispense Refill  . aspirin 81 MG chewable tablet Chew 81 mg by mouth every morning.       Marland Kitchen atenolol (TENORMIN) 25 MG tablet Take 25 mg by mouth every morning.       . furosemide (LASIX) 40 MG tablet Take 1 tablet (40 mg total) by mouth every morning.  30 tablet  2  . hydroxychloroquine (PLAQUENIL) 200 MG tablet       . levothyroxine (SYNTHROID, LEVOTHROID) 50 MCG tablet TAKE 1 TABLET (50 MCG TOTAL) BY MOUTH DAILY.  30 tablet  5  . nitroGLYCERIN (NITROSTAT) 0.4 MG SL tablet Place 0.4 mg under the tongue every 5 (five) minutes x 3 doses as needed. For chest pain      . ondansetron (ZOFRAN) 4 MG tablet Take 1 tablet (4 mg total) by mouth every 8 (eight) hours as needed for nausea.  20 tablet  0  . pantoprazole (PROTONIX) 40 MG tablet TAKE 1 TABLET BY MOUTH EVERY DAY  30 tablet  1  . predniSONE (DELTASONE) 5 MG tablet       . ramipril (ALTACE) 1.25 MG capsule TAKE 1 CAPSULE BY MOUTH DAILY  30 capsule  1  . rosuvastatin (CRESTOR) 20 MG tablet Take 1 tablet (20 mg total)  by mouth at bedtime.  30 tablet  11   No current facility-administered medications for this visit.    No Known Allergies  History   Social History  . Marital Status: Single    Spouse Name: N/A    Number of Children: N/A  . Years of Education: N/A   Occupational History  . Not on file.   Social History Main Topics  . Smoking status: Light Tobacco Smoker -- 0.50 packs/day for 30 years    Types: Cigarettes    Last Attempt to Quit: 12/23/2011  . Smokeless tobacco: Never Used  . Alcohol Use: No  . Drug Use: No  . Sexually Active: No   Other Topics Concern  . Not on file   Social History Narrative   HSG,  graduated from [employer] in [place]. In college [name] -grad '13 with relgious studies. [employer] - Fall '13 for MA-divinity. Marrried '73 - 2 years/ divorced. 2 son- '73, '75 - CP.     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     Family History  Problem Relation Age of Onset  . Cancer Mother     breast and cervical  . Paget's disease of bone Mother   . Fibromyalgia Mother   . Cancer Paternal Grandfather     colon  . Heart attack Other   . Coronary artery disease Other   . Diabetes Other   . Hypertension Other   . Hyperlipidemia Other   . Hypertension Father   . Heart disease Father     PVD  . GER disease Father     Review of Systems:  As stated in the HPI and otherwise negative.   BP 90/78  Pulse 60  Ht 5' 6.5" (1.689 m)  Wt 213 lb (96.616 kg)  BMI 33.87 kg/m2  Physical Examination: General: Well developed, well nourished, NAD HEENT: OP clear, mucus membranes moist SKIN: warm, dry. No rashes. Neuro: No focal deficits Musculoskeletal: Muscle strength 5/5 all ext Psychiatric: Mood and affect normal Neck: No JVD, no carotid bruits, no thyromegaly, no lymphadenopathy. Lungs:Clear bilaterally, no wheezes, rhonci, crackles Cardiovascular: Regular rate and rhythm. No murmurs, gallops or rubs. Abdomen:Soft. Bowel sounds present. Non-tender.  Extremities: No lower extremity edema. Pulses are 2 + in the bilateral DP/PT.  EKG: NSR, rate 60 bpm.   Echo 02/18/12: Left ventricle: The cavity size was normal. Wall thickness was normal. Systolic function was vigorous. The estimated ejection fraction was in the range of 65% to 70%. - Mitral valve: Mild regurgitation. - Pulmonary arteries: Systolic pressure was mildly increased. PA peak pressure: 38mm Hg (S).  Assessment and Plan:   1. CAD: Stable. No recent chest pain. Continue current meds. No statin secondary to leg pain and swelling.       2. TOBACCO ABUSE:  Complete cessation is requested. Counseling given.

## 2012-10-01 ENCOUNTER — Encounter (HOSPITAL_COMMUNITY): Payer: Medicare Other

## 2012-10-04 ENCOUNTER — Encounter (HOSPITAL_COMMUNITY)
Admission: RE | Admit: 2012-10-04 | Discharge: 2012-10-04 | Disposition: A | Discharge status: Home / Self Care | Payer: Medicare Other | Source: Ambulatory Visit | Attending: Rheumatology | Admitting: Rheumatology

## 2012-10-04 ENCOUNTER — Other Ambulatory Visit (HOSPITAL_COMMUNITY): Payer: Self-pay | Admitting: Rheumatology

## 2012-10-04 ENCOUNTER — Encounter (HOSPITAL_COMMUNITY): Payer: Self-pay

## 2012-10-04 DIAGNOSIS — M329 Systemic lupus erythematosus, unspecified: Secondary | ICD-10-CM | POA: Insufficient documentation

## 2012-10-04 DIAGNOSIS — N058 Unspecified nephritic syndrome with other morphologic changes: Secondary | ICD-10-CM | POA: Insufficient documentation

## 2012-10-04 MED ORDER — SODIUM CHLORIDE 0.9 % IV SOLN
500.0000 mg/m2 | Freq: Once | INTRAVENOUS | Status: AC
  Administered 2012-10-04: 1060 mg via INTRAVENOUS
  Filled 2012-10-04: qty 53

## 2012-10-04 MED ORDER — SODIUM CHLORIDE 0.9 % IV SOLN
Freq: Once | INTRAVENOUS | Status: AC
  Administered 2012-10-04: 08:00:00 via INTRAVENOUS

## 2012-10-04 MED ORDER — DIPHENHYDRAMINE HCL 25 MG PO TABS
25.0000 mg | ORAL_TABLET | Freq: Once | ORAL | Status: AC
  Administered 2012-10-04: 25 mg via ORAL
  Filled 2012-10-04 (×2): qty 1

## 2012-10-04 MED ORDER — ONDANSETRON HCL 8 MG PO TABS
8.0000 mg | ORAL_TABLET | Freq: Once | ORAL | Status: AC
Start: 2012-10-04 — End: 2012-10-04
  Administered 2012-10-04: 8 mg via ORAL
  Filled 2012-10-04: qty 1

## 2012-10-04 NOTE — Progress Notes (Signed)
Pt here today for her Cyclophosphamide infusion. Pt states she has tolerated it well but "the Zofran always gives me a headache" I offered to call Dr Nickola Major to request another antiemetic but pt declined. She states" After I take it and then I go to sleep for awhile then the headache is gone" Encouraged patient to talk with Dr Nickola Major about this and if she has future infusions she could order something else. Pt verbalized understanding

## 2012-10-08 ENCOUNTER — Other Ambulatory Visit (HOSPITAL_COMMUNITY): Payer: Medicare Other

## 2012-10-15 DIAGNOSIS — D649 Anemia, unspecified: Secondary | ICD-10-CM | POA: Diagnosis not present

## 2012-10-15 DIAGNOSIS — M329 Systemic lupus erythematosus, unspecified: Secondary | ICD-10-CM | POA: Diagnosis not present

## 2012-10-15 DIAGNOSIS — N2581 Secondary hyperparathyroidism of renal origin: Secondary | ICD-10-CM | POA: Diagnosis not present

## 2012-10-21 DIAGNOSIS — M329 Systemic lupus erythematosus, unspecified: Secondary | ICD-10-CM | POA: Diagnosis not present

## 2012-10-21 DIAGNOSIS — Z79899 Other long term (current) drug therapy: Secondary | ICD-10-CM | POA: Diagnosis not present

## 2012-11-04 DIAGNOSIS — Z79899 Other long term (current) drug therapy: Secondary | ICD-10-CM | POA: Diagnosis not present

## 2012-11-10 ENCOUNTER — Other Ambulatory Visit: Payer: Self-pay

## 2012-11-10 DIAGNOSIS — Z1231 Encounter for screening mammogram for malignant neoplasm of breast: Secondary | ICD-10-CM

## 2012-11-11 DIAGNOSIS — Z79899 Other long term (current) drug therapy: Secondary | ICD-10-CM | POA: Diagnosis not present

## 2012-11-19 DIAGNOSIS — Z79899 Other long term (current) drug therapy: Secondary | ICD-10-CM | POA: Diagnosis not present

## 2012-12-02 ENCOUNTER — Other Ambulatory Visit: Payer: Self-pay | Admitting: Cardiovascular Disease

## 2012-12-03 ENCOUNTER — Ambulatory Visit: Payer: Medicare Other

## 2012-12-08 DIAGNOSIS — Z79899 Other long term (current) drug therapy: Secondary | ICD-10-CM | POA: Diagnosis not present

## 2012-12-08 DIAGNOSIS — M329 Systemic lupus erythematosus, unspecified: Secondary | ICD-10-CM | POA: Diagnosis not present

## 2012-12-15 ENCOUNTER — Ambulatory Visit (INDEPENDENT_AMBULATORY_CARE_PROVIDER_SITE_OTHER): Payer: Medicare Other | Admitting: Internal Medicine

## 2012-12-15 ENCOUNTER — Encounter: Payer: Self-pay | Admitting: Internal Medicine

## 2012-12-15 VITALS — BP 120/90 | HR 67 | Temp 98.2°F | Wt 214.0 lb

## 2012-12-15 DIAGNOSIS — J111 Influenza due to unidentified influenza virus with other respiratory manifestations: Secondary | ICD-10-CM | POA: Diagnosis not present

## 2012-12-15 MED ORDER — PROMETHAZINE-CODEINE 6.25-10 MG/5ML PO SYRP: 5.0000 mL | ORAL_SOLUTION | ORAL | Status: DC | PRN

## 2012-12-15 NOTE — Progress Notes (Signed)
Subjective:    Patient ID: [name], female    DOB: April 29, 1955, 57 y.o.   MRN: [id number]  HPI Ms. Badal had a flu shot last Thursday and within 24 hrs she had fever, coughing, chest pain, headache, rhinorrhea. Her symptoms have persisted and worsened. No respiratory difficulty or distress. Positive for nausea w/o vomiting, no diarrhea. No dysuria. No back or flank pain.   Past Medical History  Diagnosis Date  . Peripheral vascular disease, unspecified   . Esophageal reflux   . Mitral valve insufficiency and aortic valve insufficiency   . Immune thrombocytopenic purpura   . Unspecified disease of pericardium   . Chronic ischemic heart disease, unspecified     w/ stents in mid circumflex and mid RCA in 03, and balloon angioplasty of Diagonal branch 10-03, last cath Sept 08- 2 vessel disease w/ patent stents  . Lupus erythematosus   . Hypothyroidism    Past Surgical History  Procedure Laterality Date  . Plastic surgical repair       dog bite to leg  . Splenectomy  5-04  . Cholecystectomy, laparoscopic  2010   Family History  Problem Relation Age of Onset  . Cancer Mother     breast and cervical  . Paget's disease of bone Mother   . Fibromyalgia Mother   . Cancer Paternal Grandfather     colon  . Heart attack Other   . Coronary artery disease Other   . Diabetes Other   . Hypertension Other   . Hyperlipidemia Other   . Hypertension Father   . Heart disease Father     PVD  . GER disease Father    History   Social History  . Marital Status: Single    Spouse Name: N/A    Number of Children: N/A  . Years of Education: N/A   Occupational History  . Not on file.   Social History Main Topics  . Smoking status: Light Tobacco Smoker -- 0.50 packs/day for 30 years    Types: Cigarettes    Last Attempt to Quit: 12/23/2011  . Smokeless tobacco: Never Used  . Alcohol Use: No  . Drug Use: No  . Sexual Activity: No   Other Topics Concern  . Not on file   Social  History Narrative   HSG,  graduated from [employer] in [place] state. In college [name] -grad '13 with relgious studies. [employer] - Fall '13 for MA-divinity. Marrried '73 - 2 years/ divorced. 2 son- '73, '75 - CP.    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      Current Outpatient Prescriptions on File Prior to Visit  Medication Sig Dispense Refill  . aspirin 81 MG chewable tablet Chew 81 mg by mouth every morning.       Marland Kitchen atenolol (TENORMIN) 25 MG tablet Take 25 mg by mouth every morning.       . furosemide (LASIX) 40 MG tablet Take 1 tablet (40 mg total) by mouth every morning.  30 tablet  2  . hydroxychloroquine (PLAQUENIL) 200 MG tablet       . levothyroxine (SYNTHROID, LEVOTHROID) 50 MCG tablet TAKE 1 TABLET (50 MCG TOTAL) BY MOUTH DAILY.  30 tablet  5  . nitroGLYCERIN (NITROSTAT) 0.4 MG SL tablet Place 0.4 mg under the tongue every 5 (five) minutes x 3 doses as needed. For chest pain      .  ondansetron (ZOFRAN) 4 MG tablet Take 1 tablet (4 mg total) by mouth every 8 (eight) hours as needed for nausea.  20 tablet  0  . pantoprazole (PROTONIX) 40 MG tablet TAKE 1 TABLET BY MOUTH EVERY DAY  90 tablet  3  . predniSONE (DELTASONE) 5 MG tablet       . ramipril (ALTACE) 1.25 MG capsule TAKE 1 CAPSULE BY MOUTH DAILY  30 capsule  1  . rosuvastatin (CRESTOR) 20 MG tablet Take 1 tablet (20 mg total) by mouth at bedtime.  30 tablet  11   No current facility-administered medications on file prior to visit.      Review of Systems System review is negative for any constitutional, cardiac, pulmonary, GI or neuro symptoms or complaints other than as described in the HPI.     Objective:   Physical Exam Filed Vitals:   12/15/12 0918  BP: 120/90  Pulse: 67  Temp: 98.2 F (36.8 C)   gen'l - haggard appearing woman in acute distress HEENT- TM ok, throat clear Nodes - negative cervical  supraclavicular Cor - 2+ radial RRR Pulm - CTAP Neuro - alert.       Assessment & Plan:  This is most likely a mild case of influenza related to the shot. No evidence of bacterial infection and no evidence to suggest Guillane-Barre syndrom.  Plan Supportive care, as you are doing:  Tylenol 1,000 mg three times a day on schedule  Hydrate  Ecchinacea  Cough syrup with codeine - 1 tsp q4 as needed, watch for drowsiness.

## 2012-12-15 NOTE — Patient Instructions (Addendum)
Sorry you are feeling ill.   This is most likely a mild case of influenza related to the shot. No evidence of bacterial infection and no evidence to suggest Guillane-Barre syndrom.  Plan Supportive care, as you are doing:  Tylenol 1,000 mg three times a day on schedule  Hydrate  Ecchinacea  Cough syrup with codeine - 1 tsp q4 as needed, watch for drowsiness.

## 2012-12-18 ENCOUNTER — Encounter: Payer: Self-pay | Admitting: Internal Medicine

## 2012-12-21 ENCOUNTER — Ambulatory Visit: Payer: Medicare Other | Admitting: Internal Medicine

## 2012-12-21 DIAGNOSIS — Z0289 Encounter for other administrative examinations: Secondary | ICD-10-CM

## 2012-12-22 ENCOUNTER — Other Ambulatory Visit: Payer: Self-pay | Admitting: Cardiovascular Disease

## 2013-01-06 DIAGNOSIS — Z79899 Other long term (current) drug therapy: Secondary | ICD-10-CM | POA: Diagnosis not present

## 2013-01-06 DIAGNOSIS — H5200 Hypermetropia, unspecified eye: Secondary | ICD-10-CM | POA: Diagnosis not present

## 2013-01-06 DIAGNOSIS — H524 Presbyopia: Secondary | ICD-10-CM | POA: Diagnosis not present

## 2013-01-06 DIAGNOSIS — H52229 Regular astigmatism, unspecified eye: Secondary | ICD-10-CM | POA: Diagnosis not present

## 2013-01-27 ENCOUNTER — Other Ambulatory Visit: Payer: Self-pay

## 2013-02-16 DIAGNOSIS — M329 Systemic lupus erythematosus, unspecified: Secondary | ICD-10-CM | POA: Diagnosis not present

## 2013-02-16 DIAGNOSIS — M2550 Pain in unspecified joint: Secondary | ICD-10-CM | POA: Diagnosis not present

## 2013-02-16 DIAGNOSIS — Z79899 Other long term (current) drug therapy: Secondary | ICD-10-CM | POA: Diagnosis not present

## 2013-03-02 ENCOUNTER — Other Ambulatory Visit: Payer: Self-pay | Admitting: Cardiovascular Disease

## 2013-04-04 ENCOUNTER — Other Ambulatory Visit (HOSPITAL_COMMUNITY): Payer: Self-pay | Admitting: Internal Medicine

## 2013-04-22 DIAGNOSIS — D631 Anemia in chronic kidney disease: Secondary | ICD-10-CM | POA: Diagnosis not present

## 2013-04-22 DIAGNOSIS — E785 Hyperlipidemia, unspecified: Secondary | ICD-10-CM | POA: Diagnosis not present

## 2013-04-22 DIAGNOSIS — I129 Hypertensive chronic kidney disease with stage 1 through stage 4 chronic kidney disease, or unspecified chronic kidney disease: Secondary | ICD-10-CM | POA: Diagnosis not present

## 2013-04-22 DIAGNOSIS — N181 Chronic kidney disease, stage 1: Secondary | ICD-10-CM | POA: Diagnosis not present

## 2013-06-01 DIAGNOSIS — R040 Epistaxis: Secondary | ICD-10-CM | POA: Diagnosis not present

## 2013-06-01 DIAGNOSIS — M329 Systemic lupus erythematosus, unspecified: Secondary | ICD-10-CM | POA: Diagnosis not present

## 2013-06-01 DIAGNOSIS — Z79899 Other long term (current) drug therapy: Secondary | ICD-10-CM | POA: Diagnosis not present

## 2013-07-04 ENCOUNTER — Other Ambulatory Visit: Payer: Self-pay | Admitting: Cardiovascular Disease

## 2013-07-05 ENCOUNTER — Other Ambulatory Visit: Payer: Self-pay | Admitting: Cardiovascular Disease

## 2013-08-01 ENCOUNTER — Other Ambulatory Visit: Payer: Self-pay

## 2013-08-01 DIAGNOSIS — Z1231 Encounter for screening mammogram for malignant neoplasm of breast: Secondary | ICD-10-CM

## 2013-08-04 ENCOUNTER — Ambulatory Visit
Admission: RE | Admit: 2013-08-04 | Discharge: 2013-08-04 | Disposition: A | Discharge status: Home / Self Care | Payer: Medicare Other | Source: Ambulatory Visit

## 2013-08-04 DIAGNOSIS — Z1231 Encounter for screening mammogram for malignant neoplasm of breast: Secondary | ICD-10-CM

## 2013-08-08 ENCOUNTER — Telehealth: Payer: Self-pay | Admitting: Internal Medicine

## 2013-08-08 ENCOUNTER — Other Ambulatory Visit: Payer: Self-pay | Admitting: *Deleted

## 2013-08-08 ENCOUNTER — Encounter: Payer: Self-pay | Admitting: Internal Medicine

## 2013-08-08 ENCOUNTER — Ambulatory Visit (INDEPENDENT_AMBULATORY_CARE_PROVIDER_SITE_OTHER): Payer: Medicare Other | Admitting: Internal Medicine

## 2013-08-08 VITALS — BP 130/94 | HR 64 | Temp 98.0°F | Wt 225.8 lb

## 2013-08-08 DIAGNOSIS — L988 Other specified disorders of the skin and subcutaneous tissue: Secondary | ICD-10-CM | POA: Diagnosis not present

## 2013-08-08 DIAGNOSIS — L851 Acquired keratosis [keratoderma] palmaris et plantaris: Secondary | ICD-10-CM | POA: Diagnosis not present

## 2013-08-08 DIAGNOSIS — L570 Actinic keratosis: Secondary | ICD-10-CM

## 2013-08-08 MED ORDER — LEVOTHYROXINE SODIUM 50 MCG PO TABS: ORAL_TABLET | ORAL | Status: DC

## 2013-08-08 NOTE — Progress Notes (Signed)
   Subjective:    Patient ID: [name], female    DOB: 1956/03/19, 58 y.o.   MRN: [id number]  HPI   For approximately a year she's had a skin lesion in each superior lateral inguinal crease area. These had been stable until the past month when they began to be painful, this is described as burning. The lesions are nonpruritic in nature  Significantly she has a history of lupus and had been on Cytoxan until he was switched to Imuran or in August 2014. Her    Review of Systems   She denies fever, chills,  sweats, or weight loss. In fact she's actually gained weight  She describes night sweats which she attributes to his Cytoxan       Objective:   Physical Exam   She appears healthy and well-nourished. There is weight excess, particularly centrally  She has no scleral icterus or conjunctivitis  There is no lymphadenopathy about the neck, axilla, or inguinal areas  There are is a grade 1/2 systolic murmur at the right base.  Chest is clear with no increased work of breathing  She has no organomegaly, masses, or tenderness over the abdomen  She has a 6 x 6 mm benign polyp in the right inguinal area the thigh superiorly.  There is a 5 x 3 mm keratosis in the left lower quadrant.  Neither of these lesions are inflamed are associated with a cellulitis       Assessment & Plan:  #1 benign skin polyp  #2 keratosis  Plan: It was explained that these are benign and would be considered cosmetic in the absence of associated cellulitis/inflammation  She will attempt to use Aveeno daily Moisturizing lotion on the keratotic lesion

## 2013-08-08 NOTE — Progress Notes (Signed)
Pre visit review using our clinic review tool, if applicable. No additional management support is needed unless otherwise documented below in the visit note. 

## 2013-08-08 NOTE — Patient Instructions (Signed)
Use  Aveeno Daily  Moisturizing Lotion  twice a day  for the keratosis. Bathe with moisturizing liquid soap , not bar soap. 

## 2013-08-08 NOTE — Telephone Encounter (Signed)
Relevant patient education assigned to patient using Emmi. ° °

## 2013-08-10 ENCOUNTER — Other Ambulatory Visit: Payer: Self-pay | Admitting: Internal Medicine

## 2013-08-10 DIAGNOSIS — L988 Other specified disorders of the skin and subcutaneous tissue: Secondary | ICD-10-CM

## 2013-08-10 DIAGNOSIS — L570 Actinic keratosis: Secondary | ICD-10-CM

## 2013-09-15 DIAGNOSIS — D235 Other benign neoplasm of skin of trunk: Secondary | ICD-10-CM | POA: Diagnosis not present

## 2013-09-15 DIAGNOSIS — L821 Other seborrheic keratosis: Secondary | ICD-10-CM | POA: Diagnosis not present

## 2013-09-15 DIAGNOSIS — L819 Disorder of pigmentation, unspecified: Secondary | ICD-10-CM | POA: Diagnosis not present

## 2013-09-15 DIAGNOSIS — D485 Neoplasm of uncertain behavior of skin: Secondary | ICD-10-CM | POA: Diagnosis not present

## 2013-10-04 ENCOUNTER — Other Ambulatory Visit: Payer: Self-pay | Admitting: Cardiovascular Disease

## 2013-10-12 ENCOUNTER — Ambulatory Visit: Payer: Medicare Other | Admitting: Cardiovascular Disease

## 2013-10-20 DIAGNOSIS — M329 Systemic lupus erythematosus, unspecified: Secondary | ICD-10-CM | POA: Diagnosis not present

## 2013-10-20 DIAGNOSIS — M2550 Pain in unspecified joint: Secondary | ICD-10-CM | POA: Diagnosis not present

## 2013-10-20 DIAGNOSIS — Z79899 Other long term (current) drug therapy: Secondary | ICD-10-CM | POA: Diagnosis not present

## 2013-11-08 ENCOUNTER — Other Ambulatory Visit: Payer: Self-pay | Admitting: Cardiovascular Disease

## 2013-11-10 ENCOUNTER — Other Ambulatory Visit: Payer: Self-pay | Admitting: Cardiovascular Disease

## 2013-11-22 DIAGNOSIS — Z79899 Other long term (current) drug therapy: Secondary | ICD-10-CM | POA: Diagnosis not present

## 2013-12-01 DIAGNOSIS — E785 Hyperlipidemia, unspecified: Secondary | ICD-10-CM | POA: Diagnosis not present

## 2013-12-01 DIAGNOSIS — N181 Chronic kidney disease, stage 1: Secondary | ICD-10-CM | POA: Diagnosis not present

## 2013-12-01 DIAGNOSIS — D631 Anemia in chronic kidney disease: Secondary | ICD-10-CM | POA: Diagnosis not present

## 2013-12-01 DIAGNOSIS — Z23 Encounter for immunization: Secondary | ICD-10-CM | POA: Diagnosis not present

## 2013-12-01 DIAGNOSIS — N039 Chronic nephritic syndrome with unspecified morphologic changes: Secondary | ICD-10-CM | POA: Diagnosis not present

## 2013-12-10 ENCOUNTER — Other Ambulatory Visit: Payer: Self-pay | Admitting: Cardiovascular Disease

## 2013-12-12 ENCOUNTER — Other Ambulatory Visit: Payer: Self-pay | Admitting: *Deleted

## 2013-12-12 ENCOUNTER — Other Ambulatory Visit: Payer: Self-pay

## 2013-12-12 MED ORDER — LEVOTHYROXINE SODIUM 50 MCG PO TABS: ORAL_TABLET | ORAL | Status: DC

## 2013-12-12 MED ORDER — ATENOLOL 25 MG PO TABS: ORAL_TABLET | ORAL | Status: DC

## 2013-12-12 NOTE — Telephone Encounter (Signed)
Left msg on triage requesting 90 day on pt levothyroxine...[name]

## 2013-12-16 ENCOUNTER — Encounter: Payer: Self-pay | Admitting: Internal Medicine

## 2013-12-16 ENCOUNTER — Other Ambulatory Visit (INDEPENDENT_AMBULATORY_CARE_PROVIDER_SITE_OTHER): Payer: Medicare Other

## 2013-12-16 ENCOUNTER — Ambulatory Visit (INDEPENDENT_AMBULATORY_CARE_PROVIDER_SITE_OTHER): Payer: Medicare Other | Admitting: Internal Medicine

## 2013-12-16 ENCOUNTER — Ambulatory Visit (INDEPENDENT_AMBULATORY_CARE_PROVIDER_SITE_OTHER)
Admission: RE | Admit: 2013-12-16 | Discharge: 2013-12-16 | Disposition: A | Discharge status: Home / Self Care | Payer: Medicare Other | Source: Ambulatory Visit | Attending: Internal Medicine | Admitting: Internal Medicine

## 2013-12-16 VITALS — BP 130/84 | HR 70 | Temp 97.7°F | Resp 16 | Ht 66.5 in | Wt 239.0 lb

## 2013-12-16 DIAGNOSIS — E038 Other specified hypothyroidism: Secondary | ICD-10-CM

## 2013-12-16 DIAGNOSIS — R072 Precordial pain: Secondary | ICD-10-CM | POA: Diagnosis not present

## 2013-12-16 DIAGNOSIS — R059 Cough, unspecified: Secondary | ICD-10-CM

## 2013-12-16 DIAGNOSIS — I82409 Acute embolism and thrombosis of unspecified deep veins of unspecified lower extremity: Secondary | ICD-10-CM | POA: Diagnosis not present

## 2013-12-16 DIAGNOSIS — I209 Angina pectoris, unspecified: Secondary | ICD-10-CM | POA: Diagnosis not present

## 2013-12-16 DIAGNOSIS — R05 Cough: Secondary | ICD-10-CM

## 2013-12-16 DIAGNOSIS — R0602 Shortness of breath: Secondary | ICD-10-CM | POA: Diagnosis not present

## 2013-12-16 DIAGNOSIS — I259 Chronic ischemic heart disease, unspecified: Secondary | ICD-10-CM

## 2013-12-16 DIAGNOSIS — I25119 Atherosclerotic heart disease of native coronary artery with unspecified angina pectoris: Secondary | ICD-10-CM

## 2013-12-16 DIAGNOSIS — I2510 Atherosclerotic heart disease of native coronary artery without angina pectoris: Secondary | ICD-10-CM

## 2013-12-16 DIAGNOSIS — R079 Chest pain, unspecified: Secondary | ICD-10-CM | POA: Diagnosis not present

## 2013-12-16 LAB — CARDIAC PANEL
CK-MB: 2.1 ng/mL (ref 0.3–4.0)
Relative Index: 1.3 calc (ref 0.0–2.5)
Total CK: 168 U/L (ref 7–177)

## 2013-12-16 LAB — COMPREHENSIVE METABOLIC PANEL
ALT: 14 U/L (ref 0–35)
AST: 20 U/L (ref 0–37)
Albumin: 3.5 g/dL (ref 3.5–5.2)
Alkaline Phosphatase: 97 U/L (ref 39–117)
BUN: 16 mg/dL (ref 6–23)
CO2: 24 mEq/L (ref 19–32)
Calcium: 8.7 mg/dL (ref 8.4–10.5)
Chloride: 106 mEq/L (ref 96–112)
Creatinine, Ser: 1 mg/dL (ref 0.4–1.2)
GFR: 70.01 mL/min (ref >60.00)
Glucose, Bld: 95 mg/dL (ref 70–99)
Potassium: 3.8 mEq/L (ref 3.5–5.1)
Sodium: 138 mEq/L (ref 135–145)
Total Bilirubin: 0.5 mg/dL (ref 0.2–1.2)
Total Protein: 7 g/dL (ref 6.0–8.3)

## 2013-12-16 LAB — CBC WITH DIFFERENTIAL/PLATELET
Basophils Absolute: 0 10*3/uL (ref 0.0–0.1)
Basophils Relative: 0.3 % (ref 0.0–3.0)
Eosinophils Absolute: 0.1 10*3/uL (ref 0.0–0.7)
Eosinophils Relative: 2.9 % (ref 0.0–5.0)
HCT: 37.4 % (ref 36.0–46.0)
Hemoglobin: 12.6 g/dL (ref 12.0–15.0)
Lymphocytes Relative: 16.2 % (ref 12.0–46.0)
Lymphs Abs: 0.8 10*3/uL (ref 0.7–4.0)
MCHC: 33.5 g/dL (ref 30.0–36.0)
MCV: 101.8 fl — ABNORMAL HIGH (ref 78.0–100.0)
Monocytes Absolute: 0.6 10*3/uL (ref 0.1–1.0)
Monocytes Relative: 11.6 % (ref 3.0–12.0)
Neutro Abs: 3.6 10*3/uL (ref 1.4–7.7)
Neutrophils Relative %: 69 % (ref 43.0–77.0)
Platelets: 158 10*3/uL (ref 150.0–400.0)
RBC: 3.68 Mil/uL — ABNORMAL LOW (ref 3.87–5.11)
RDW: 15.7 % — ABNORMAL HIGH (ref 11.5–15.5)
WBC: 5.2 10*3/uL (ref 4.0–10.5)

## 2013-12-16 LAB — TSH: TSH: 1.38 u[IU]/mL (ref 0.35–4.50)

## 2013-12-16 LAB — D-DIMER, QUANTITATIVE: D-Dimer, Quant: 0.67 ug/mL-FEU — ABNORMAL HIGH (ref 0.00–0.48)

## 2013-12-16 LAB — TROPONIN I: Troponin I: <0.01 ng/mL (ref <0.06)

## 2013-12-16 NOTE — Patient Instructions (Signed)

## 2013-12-16 NOTE — Progress Notes (Signed)
Pre visit review using our clinic review tool, if applicable. No additional management support is needed unless otherwise documented below in the visit note. 

## 2013-12-18 NOTE — Assessment & Plan Note (Signed)
Her TSH is in the normal range Will cont the current dose

## 2013-12-18 NOTE — Assessment & Plan Note (Signed)
Her CXR is normal Will follow for now

## 2013-12-18 NOTE — Assessment & Plan Note (Signed)
Her pain is dull and not suspicious for ischemia or PE Her EKG has a few non-specific changes and is unchanged from her prior EKG Her cardiac enzymes are negative Her d-dimer is very slightly elevated but not high enough to be of concern for a DVT or PE She will see cardiology next week for f/up

## 2013-12-18 NOTE — Progress Notes (Signed)
Subjective:    Patient ID: [name], female    DOB: 1955-12-10, 58 y.o.   MRN: [id number]  Chest Pain  This is a recurrent problem. The current episode started 1 to 4 weeks ago. The onset quality is gradual. The problem occurs intermittently. The problem has been unchanged. The pain is present in the substernal region. The pain is at a severity of 1/10. The pain is mild. The quality of the pain is described as dull. The pain does not radiate. Associated symptoms include a cough (NP) and malaise/fatigue. Pertinent negatives include no abdominal pain, back pain, claudication, diaphoresis, dizziness, exertional chest pressure, fever, headaches, hemoptysis, irregular heartbeat, leg pain, lower extremity edema, nausea, near-syncope, numbness, orthopnea, palpitations, PND, shortness of breath, sputum production, syncope, vomiting or weakness. The cough has no precipitants. The cough is non-productive. Nothing relieves the cough. The pain is aggravated by nothing. She has tried nothing for the symptoms. The treatment provided no relief.  Her past medical history is significant for CAD and DVT.  Pertinent negatives for past medical history include no seizures.      Review of Systems  Constitutional: Positive for malaise/fatigue and fatigue. Negative for fever, chills, diaphoresis, activity change, appetite change and unexpected weight change.  HENT: Negative.   Eyes: Negative.   Respiratory: Positive for cough (NP). Negative for apnea, hemoptysis, sputum production, choking, chest tightness, shortness of breath, wheezing and stridor.   Cardiovascular: Positive for chest pain. Negative for palpitations, orthopnea, claudication, leg swelling, syncope, PND and near-syncope.  Gastrointestinal: Negative.  Negative for nausea, vomiting, abdominal pain, diarrhea, constipation and blood in stool.  Endocrine: Negative.   Genitourinary: Negative.  Negative for dysuria, urgency, frequency, hematuria,  flank pain and difficulty urinating.  Musculoskeletal: Negative.  Negative for arthralgias, back pain, gait problem, joint swelling, myalgias, neck pain and neck stiffness.  Skin: Negative.   Allergic/Immunologic: Negative.   Neurological: Positive for light-headedness. Negative for dizziness, tremors, seizures, syncope, facial asymmetry, speech difficulty, weakness, numbness and headaches.  Hematological: Negative.  Negative for adenopathy. Does not bruise/bleed easily.  Psychiatric/Behavioral: Negative.        Objective:   Physical Exam  Vitals reviewed. Constitutional: She is oriented to person, place, and time. She appears well-developed and well-nourished. No distress.  HENT:  Head: Normocephalic and atraumatic.  Mouth/Throat: Oropharynx is clear and moist. No oropharyngeal exudate.  Eyes: Conjunctivae are normal. Right eye exhibits no discharge. Left eye exhibits no discharge. No scleral icterus.  Neck: Normal range of motion. Neck supple. No JVD present. No tracheal deviation present. No thyromegaly present.  Cardiovascular: Normal rate, regular rhythm, normal heart sounds and intact distal pulses.  Exam reveals no gallop and no friction rub.   No murmur heard. Pulmonary/Chest: Effort normal and breath sounds normal. No stridor. No respiratory distress. She has no wheezes. She has no rales. She exhibits no tenderness.  Abdominal: Soft. Bowel sounds are normal. She exhibits no distension and no mass. There is no tenderness. There is no rebound and no guarding.  Musculoskeletal: Normal range of motion. She exhibits no edema and no tenderness.  Lymphadenopathy:    She has no cervical adenopathy.  Neurological: She is oriented to person, place, and time.  Skin: Skin is warm and dry. No rash noted. She is not diaphoretic. No erythema. No pallor.  Psychiatric: She has a normal mood and affect. Her behavior is normal. Judgment and thought content normal.          Assessment & Plan:

## 2013-12-19 ENCOUNTER — Telehealth: Payer: Self-pay | Admitting: *Deleted

## 2013-12-19 NOTE — Telephone Encounter (Signed)
[facility] Triage Call Report Triage Record Num: [id number] Operator: Noemi Chapel Patient Name: [name] Call Date & Time: 12/16/2013 8:01:40PM Patient Phone: [phone] PCP: Adella Hare Patient Gender: Female PCP Fax : Patient DOB: 11/16/1955 Practice Name: [facility] Reason for Call: Caller: [name]; PCP: Scarlette Calico (Adults only); CB#: [phone]; Call regarding [name] is calling from [facility] regarding a D Dime, Troponin I ordered on [name], [name] by Scarlette Calico (Adults only). Caller reports labs drawn at 15:06 today, Friday 9/25 indicate D-Dimer high at 0.67 and Troponin I < 0.01. "No indication of myocardial injury." Lab values noted and faxed to office per Practice Profile. Protocol(s) Used: PCP Calls, No Triage (Adult) Recommended Outcome per Protocol: Call Provider within 24 Hours Reason for Outcome: Lab calling with test results Care Advice: ~ 09/

## 2013-12-20 ENCOUNTER — Ambulatory Visit: Payer: Medicare Other | Admitting: Nurse Practitioner

## 2013-12-20 ENCOUNTER — Encounter: Payer: Self-pay | Admitting: Cardiovascular Disease

## 2013-12-20 ENCOUNTER — Ambulatory Visit (INDEPENDENT_AMBULATORY_CARE_PROVIDER_SITE_OTHER): Payer: Medicare Other | Admitting: Cardiovascular Disease

## 2013-12-20 VITALS — BP 120/80 | HR 75 | Resp 12 | Wt 234.0 lb

## 2013-12-20 DIAGNOSIS — Z87891 Personal history of nicotine dependence: Secondary | ICD-10-CM

## 2013-12-20 DIAGNOSIS — I259 Chronic ischemic heart disease, unspecified: Secondary | ICD-10-CM

## 2013-12-20 DIAGNOSIS — I2510 Atherosclerotic heart disease of native coronary artery without angina pectoris: Secondary | ICD-10-CM | POA: Diagnosis not present

## 2013-12-20 DIAGNOSIS — R079 Chest pain, unspecified: Secondary | ICD-10-CM

## 2013-12-20 DIAGNOSIS — I200 Unstable angina: Secondary | ICD-10-CM | POA: Diagnosis not present

## 2013-12-20 DIAGNOSIS — I059 Rheumatic mitral valve disease, unspecified: Secondary | ICD-10-CM | POA: Diagnosis not present

## 2013-12-20 NOTE — Progress Notes (Signed)
History of Present Illness: 58 yo AAF with h/o CAD s/p bare metal stent RCA and Circumflex 2003, PTCA Diagonal 2003, hypothyroidism, moderate MR, GERD, lupus here today for cardiac follow up. She had previously been followed in [facility] Cardiology clinic by Dr. Melvern Banker. I saw her as a new patient in November 2010. She has had pain in her back and in both legs at night. No exertional leg pain. Lower ext dopplers with no evidence of PAD. Most recent ABI May 2013 normal. Last echo 02/18/12 with normal LV size and function with mild MR. She was admitted to [facility] December 2013 with massive volume overload. She was followed by Nephrology. Her acute illness was felt to be due to lupus flare.   She is here today for follow up. She describes pain in her chest and between shoulder blades with exertion. No associated SOB. No swelling. She also notes overall fatigue. She feels that she has the flu. She is rarely smoking. She stopped her statin due to her "skin turning colors".   Primary Care Physician:  Wayne Sever   Last Lipid Profile:Lipid Panel    Component  Value  Date/Time    CHOL  358*  03/07/2012 0350    TRIG  145  03/07/2012 0350    HDL  77  03/07/2012 0350    CHOLHDL  4.6  03/07/2012 0350    VLDL  29  03/07/2012 0350    LDLCALC  252*  03/07/2012 0350    Past Medical History   Diagnosis  Date   .  Peripheral vascular disease, unspecified    .  Esophageal reflux    .  Mitral valve insufficiency and aortic valve insufficiency    .  Immune thrombocytopenic purpura    .  Unspecified disease of pericardium    .  Chronic ischemic heart disease, unspecified      w/ stents in mid circumflex and mid RCA in 03, and balloon angioplasty of Diagonal branch 10-03, last cath Sept 08- 2 vessel disease w/ patent stents   .  Lupus erythematosus    .  Hypothyroidism     Past Surgical History   Procedure  Laterality  Date   .  Plastic surgical repair       dog bite to leg   .  Splenectomy   5-04   .   Cholecystectomy, laparoscopic   2010    Current Outpatient Prescriptions   Medication  Sig  Dispense  Refill   .  aspirin 81 MG chewable tablet  Chew 81 mg by mouth every morning.     Marland Kitchen  atenolol (TENORMIN) 25 MG tablet  Take 25 mg by mouth every morning.     .  furosemide (LASIX) 40 MG tablet  Take 1 tablet (40 mg total) by mouth every morning.  30 tablet  2   .  hydroxychloroquine (PLAQUENIL) 200 MG tablet      .  levothyroxine (SYNTHROID, LEVOTHROID) 50 MCG tablet  TAKE 1 TABLET (50 MCG TOTAL) BY MOUTH DAILY.  30 tablet  5   .  nitroGLYCERIN (NITROSTAT) 0.4 MG SL tablet  Place 0.4 mg under the tongue every 5 (five) minutes x 3 doses as needed. For chest pain     .  ondansetron (ZOFRAN) 4 MG tablet  Take 1 tablet (4 mg total) by mouth every 8 (eight) hours as needed for nausea.  20 tablet  0   .  pantoprazole (PROTONIX) 40 MG tablet  TAKE 1 TABLET  BY MOUTH EVERY DAY  30 tablet  1   .  predniSONE (DELTASONE) 5 MG tablet      .  ramipril (ALTACE) 1.25 MG capsule  TAKE 1 CAPSULE BY MOUTH DAILY  30 capsule  1   .  rosuvastatin (CRESTOR) 20 MG tablet  Take 1 tablet (20 mg total) by mouth at bedtime.  30 tablet  11    No current facility-administered medications for this visit.   No Known Allergies  History    Social History   .  Marital Status:  Single     Spouse Name:  N/A     Number of Children:  N/A   .  Years of Education:  N/A    Occupational History   .  Not on file.    Social History Main Topics   .  Smoking status:  Light Tobacco Smoker -- 0.50 packs/day for 30 years     Types:  Cigarettes     Last Attempt to Quit:  12/23/2011   .  Smokeless tobacco:  Never Used   .  Alcohol Use:  No   .  Drug Use:  No   .  Sexually Active:  No    Other Topics  Concern   .  Not on file    Social History Narrative    HSG, graduated from [employer] in [place]. In college [name] -grad '13 with relgious studies. [employer] - Fall '13 for MA-divinity. Marrried '73 - 2 years/  divorced. 2 son- '73, '75 - CP.    Occupation: full-time Ship broker. She lives alone with her younger son living with her part-time but he resides in a managed care facility.    Family History   Problem  Relation  Age of Onset   .  Cancer  Mother      breast and cervical   .  Paget's disease of bone  Mother    .  Fibromyalgia  Mother    .  Cancer  Paternal Grandfather      colon   .  Heart attack  Other    .  Coronary artery disease  Other    .  Diabetes  Other    .  Hypertension  Other    .  Hyperlipidemia  Other    .  Hypertension  Father    .  Heart disease  Father      PVD   .  GER disease  Father    Review of Systems: As stated in the HPI and otherwise negative.   BP 120/80   Pulse 75  RR12 234 lbs  Physical Examination:  General: Well developed, well nourished, NAD  HEENT: OP clear, mucus membranes moist  SKIN: warm, dry. No rashes.  Neuro: No focal deficits  Musculoskeletal: Muscle strength 5/5 all ext  Psychiatric: Mood and affect normal  Neck: No JVD, no carotid bruits, no thyromegaly, no lymphadenopathy.  Lungs:Clear bilaterally, no wheezes, rhonci, crackles  Cardiovascular: Regular rate and rhythm. No murmurs, gallops or rubs.  Abdomen:Soft. Bowel sounds present. Non-tender.  Extremities: No lower extremity edema. Pulses are 2 + in the bilateral DP/PT.   EKG: NSR, rate 75 bpm. LAE  Echo 02/18/12:  Left ventricle: The cavity size was normal. Wall thickness was normal. Systolic function was vigorous. The estimated ejection fraction was in the range of 65% to 70%. - Mitral valve: Mild regurgitation. - Pulmonary arteries: Systolic pressure was mildly increased. PA peak pressure: 49mm Hg (  S).  Assessment and Plan:   1. CAD: Recent chest pains c/w her prior angina. Will arrange exercise stress myoview to exclude ischemia.  Continue current meds. She is off of her statin due to skin changes.   2. TOBACCO ABUSE, in remission: She has stopped smoking.    3.  Mitral regurgitation: Mild by echo 2013. Will repeat echo in November 2016.

## 2013-12-20 NOTE — Patient Instructions (Signed)
Your physician recommends that you schedule a follow-up appointment in:  6-8 weeks with NP or PA.   Your physician has requested that you have an exercise stress myoview. For further information please visit [email or web address]. Please follow instruction sheet, as given. To be done in about 3 weeks.

## 2013-12-21 DIAGNOSIS — R059 Cough, unspecified: Secondary | ICD-10-CM | POA: Diagnosis not present

## 2013-12-21 DIAGNOSIS — R509 Fever, unspecified: Secondary | ICD-10-CM | POA: Diagnosis not present

## 2013-12-27 DIAGNOSIS — B300 Keratoconjunctivitis due to adenovirus: Secondary | ICD-10-CM | POA: Diagnosis not present

## 2014-01-07 ENCOUNTER — Other Ambulatory Visit: Payer: Self-pay | Admitting: Cardiovascular Disease

## 2014-01-10 ENCOUNTER — Ambulatory Visit (HOSPITAL_COMMUNITY): Payer: Medicare Other | Attending: Cardiovascular Disease | Admitting: Radiology

## 2014-01-10 VITALS — BP 135/87 | HR 54 | Ht 66.5 in | Wt 233.0 lb

## 2014-01-10 DIAGNOSIS — E785 Hyperlipidemia, unspecified: Secondary | ICD-10-CM | POA: Insufficient documentation

## 2014-01-10 DIAGNOSIS — R0789 Other chest pain: Secondary | ICD-10-CM

## 2014-01-10 DIAGNOSIS — R079 Chest pain, unspecified: Secondary | ICD-10-CM

## 2014-01-10 DIAGNOSIS — I2510 Atherosclerotic heart disease of native coronary artery without angina pectoris: Secondary | ICD-10-CM | POA: Diagnosis not present

## 2014-01-10 DIAGNOSIS — I739 Peripheral vascular disease, unspecified: Secondary | ICD-10-CM | POA: Insufficient documentation

## 2014-01-10 DIAGNOSIS — R5383 Other fatigue: Secondary | ICD-10-CM | POA: Diagnosis not present

## 2014-01-10 MED ORDER — REGADENOSON 0.4 MG/5ML IV SOLN
0.4000 mg | Freq: Once | INTRAVENOUS | Status: AC
  Administered 2014-01-10: 0.4 mg via INTRAVENOUS

## 2014-01-10 MED ORDER — TECHNETIUM TC 99M SESTAMIBI GENERIC - CARDIOLITE
11.0000 | Freq: Once | INTRAVENOUS | Status: AC | PRN
  Administered 2014-01-10: 11 via INTRAVENOUS

## 2014-01-10 MED ORDER — TECHNETIUM TC 99M SESTAMIBI GENERIC - CARDIOLITE
33.0000 | Freq: Once | INTRAVENOUS | Status: AC | PRN
  Administered 2014-01-10: 33 via INTRAVENOUS

## 2014-01-10 NOTE — Progress Notes (Signed)
[facility] [address] [place], [place] [address] [phone]    Cardiology Nuclear Med Study  [name] is a 58 y.o. female     MRN : [id number]     DOB: 04-12-1955  Procedure Date: 01/10/2014  Nuclear Med Background Indication for Stress Test:  Evaluation for Ischemia and Stent Patency History:  CAD, MPI 2009 (scar) EF 62% Cardiac Risk Factors: Lipids, and PVD  Symptoms:  Chest Pain (last date of chest discomfort was in July) and Fatigue   Nuclear Pre-Procedure Caffeine/Decaff Intake:  None> 12 hrs NPO After: 7:30pm   Lungs:  clear O2 Sat: 99% on room air. IV 0.9% NS with Angio Cath:  22g  IV Site: R Hand, site unremarkable IV Started by:  Irven Baltimore, RN  Chest Size (in):  38 Cup Size: C  Height: 5' 6.5" (1.689 m)  Weight:  233 lb (105.688 kg)  BMI:  Body mass index is 37.05 kg/(m^2). Tech Comments:  Patient held Atenolol x 36 hrs. Unable to walk treadmill due to (R) hip and leg pain per patient. Irven Baltimore, RN.    Nuclear Med Study 1 or 2 day study: 1 day  Stress Test Type:  Lexiscan  Reading MD: N/A  Order Authorizing Provider:  Lauree Chandler, MD  Resting Radionuclide: Technetium 55m Sestamibi  Resting Radionuclide Dose: 11.0 mCi   Stress Radionuclide:  Technetium 66m Sestamibi  Stress Radionuclide Dose: 33.0 mCi           Stress Protocol Rest HR: 54 Stress HR: 82  Rest BP: 135/87 Stress BP: 146/92  Exercise Time (min): n/a METS: n/a           Dose of Adenosine (mg):  n/a Dose of Lexiscan: 0.4 mg  Dose of Atropine (mg): n/a Dose of Dobutamine: n/a mcg/kg/min (at max HR)  Stress Test Technologist: Glade Lloyd, BS-ES  Nuclear Technologist:  Vedia Pereyra, CNMT     Rest Procedure:  Myocardial perfusion imaging was performed at rest 45 minutes following the intravenous administration of Technetium 91m Sestamibi. Rest ECG: NSR with non-specific ST-T wave changes  Stress Procedure:  The patient received IV  Lexiscan 0.4 mg over 15-seconds.  Technetium 95m Sestamibi injected at 30-seconds.  Quantitative spect images were obtained after a 45 minute delay.  During the infusion of Lexiscan the patient complained of lightheadedness, SOB, nausea and a headache.  These symptoms began to resolve in recovery.  Stress ECG: No significant change from baseline ECG  QPS Raw Data Images:  Normal; no motion artifact; normal heart/lung ratio. Stress Images:  There is decreased uptake in the inferior wall. Rest Images:  There is decreased uptake in the inferior wall. Subtraction (SDS):  There is a fixed defect that is most consistent with a previous infarction. Transient Ischemic Dilatation (Normal <1.22):  0.90 Lung/Heart Ratio (Normal <0.45):  0.31  Quantitative Gated Spect Images QGS EDV:  121 ml QGS ESV:  61 ml  Impression Exercise Capacity:  Lexiscan with no exercise. BP Response:  Normal blood pressure response. Clinical Symptoms:  No chest pain. ECG Impression:  No significant ECG changes with Lexiscan. Comparison with Prior Nuclear Study: No images to compare  Overall Impression:  Low risk stress nuclear study. There is a medium size defect of moderate severity involving the basal and mid inferior and inferolateral segments.  There is no ischemia.   LV Ejection Fraction: 49%.  LV Wall Motion:  Normal Wall Motion  Darlin Coco MD

## 2014-02-08 ENCOUNTER — Other Ambulatory Visit: Payer: Self-pay | Admitting: Internal Medicine

## 2014-02-14 ENCOUNTER — Ambulatory Visit (INDEPENDENT_AMBULATORY_CARE_PROVIDER_SITE_OTHER): Payer: Medicare Other | Admitting: Nurse Practitioner

## 2014-02-14 ENCOUNTER — Encounter: Payer: Self-pay | Admitting: Nurse Practitioner

## 2014-02-14 VITALS — BP 112/82 | HR 64 | Ht 67.0 in | Wt 236.1 lb

## 2014-02-14 DIAGNOSIS — Z720 Tobacco use: Secondary | ICD-10-CM | POA: Diagnosis not present

## 2014-02-14 DIAGNOSIS — M329 Systemic lupus erythematosus, unspecified: Secondary | ICD-10-CM | POA: Diagnosis not present

## 2014-02-14 DIAGNOSIS — Z79899 Other long term (current) drug therapy: Secondary | ICD-10-CM | POA: Diagnosis not present

## 2014-02-14 DIAGNOSIS — I2510 Atherosclerotic heart disease of native coronary artery without angina pectoris: Secondary | ICD-10-CM

## 2014-02-14 DIAGNOSIS — J069 Acute upper respiratory infection, unspecified: Secondary | ICD-10-CM | POA: Diagnosis not present

## 2014-02-14 DIAGNOSIS — R05 Cough: Secondary | ICD-10-CM | POA: Diagnosis not present

## 2014-02-14 DIAGNOSIS — I259 Chronic ischemic heart disease, unspecified: Secondary | ICD-10-CM

## 2014-02-14 DIAGNOSIS — I340 Nonrheumatic mitral (valve) insufficiency: Secondary | ICD-10-CM | POA: Diagnosis not present

## 2014-02-14 DIAGNOSIS — R059 Cough, unspecified: Secondary | ICD-10-CM

## 2014-02-14 DIAGNOSIS — M3214 Glomerular disease in systemic lupus erythematosus: Secondary | ICD-10-CM | POA: Diagnosis not present

## 2014-02-14 MED ORDER — AMOXICILLIN 875 MG PO TABS: 875.0000 mg | ORAL_TABLET | Freq: Two times a day (BID) | ORAL | Status: DC

## 2014-02-14 NOTE — Progress Notes (Signed)
GRAY MAUGERI Date of Birth: January 17, 1956 Medical Record #[id number]  History of Present Illness: Ms. Dwyer is seen back today for a follow up visit. Seen for Dr. Angelena Form. She is a 57 yo AAF with h/o CAD s/p bare metal stent RCA and LCX in 2003, PTCA Diagonal 2003, hypothyroidism, moderate MR, GERD, and lupus.   She had previously been followed in [facility] Cardiology clinic by Dr. Melvern Banker. Last echo 02/18/12 with normal LV size and function with mild MR. She was admitted to [facility] December 2013 with massive volume overload. She was followed by Nephrology. That acute illness was felt to be due to a lupus flare.   She was last seen back in September with chest pain and between shoulder blades with exertion. No associated SOB. No swelling. She also noted overall fatigue. She was rarely smoking. She stopped her statin due to her "skin turning colors". She was referred for a Myoview. This turned out ok. See results below.   She is here today for follow up. She notes that she had a flu shot back in August and then "got the flu". Still with a cough. Says it is productive and not dry. She is on ACE. Has had 2 CXRs that were "ok". She is using Robitussin. She says she wheezes some at night. She is smoking - about one cigarette per week - she considers herself a nonsmoker. No more chest pain but her chest is sore from the cough. Has not seen her PCP.   Current Outpatient Prescriptions  Medication Sig Dispense Refill  . aspirin 81 MG chewable tablet Chew 81 mg by mouth every morning.     Marland Kitchen atenolol (TENORMIN) 25 MG tablet TAKE 1 TABLET BY MOUTH EVERY DAY 30 tablet 1  . azaTHIOprine (IMURAN) 50 MG tablet Take 50 mg by mouth 2 (two) times daily.    . CRESTOR 20 MG tablet TAKE 1 TABLET AT BEDTIME 30 tablet 11  . furosemide (LASIX) 40 MG tablet Take 40 mg by mouth as needed.    . hydroxychloroquine (PLAQUENIL) 200 MG tablet     . levothyroxine (SYNTHROID, LEVOTHROID) 50 MCG tablet TAKE 1 TABLET (50 MCG  TOTAL) BY MOUTH DAILY. 90 tablet 0  . nitroGLYCERIN (NITROSTAT) 0.4 MG SL tablet Place 0.4 mg under the tongue every 5 (five) minutes x 3 doses as needed. For chest pain    . pantoprazole (PROTONIX) 40 MG tablet TAKE 1 TABLET BY MOUTH EVERY DAY 30 tablet 1  . ramipril (ALTACE) 1.25 MG capsule TAKE 1 CAPSULE BY MOUTH DAILY 30 capsule 1  . amoxicillin (AMOXIL) 875 MG tablet Take 1 tablet (875 mg total) by mouth 2 (two) times daily. 14 tablet 0   No current facility-administered medications for this visit.    No Known Allergies  Past Medical History  Diagnosis Date  . Peripheral vascular disease, unspecified   . Esophageal reflux   . Mitral valve insufficiency and aortic valve insufficiency   . Immune thrombocytopenic purpura   . Unspecified disease of pericardium   . Chronic ischemic heart disease, unspecified     w/ stents in mid circumflex and mid RCA in 03, and balloon angioplasty of Diagonal branch 10-03, last cath Sept 08- 2 vessel disease w/ patent stents  . Lupus erythematosus   . Hypothyroidism     Past Surgical History  Procedure Laterality Date  . Plastic surgical repair       dog bite to leg  . Splenectomy  5-04  . Cholecystectomy,  laparoscopic  2010    History  Smoking status  . Light Tobacco Smoker -- 0.50 packs/day for 30 years  . Types: Cigarettes  . Last Attempt to Quit: 12/23/2011  Smokeless tobacco  . Never Used    History  Alcohol Use No    Family History  Problem Relation Age of Onset  . Cancer Mother     breast and cervical  . Paget's disease of bone Mother   . Fibromyalgia Mother   . Cancer Paternal Grandfather     colon  . Heart attack Other   . Coronary artery disease Other   . Diabetes Other   . Hypertension Other   . Hyperlipidemia Other   . Hypertension Father   . Heart disease Father     PVD  . GER disease Father     Review of Systems: The review of systems is per the HPI.  All other systems were reviewed and are  negative.  Physical Exam: BP 112/82 mmHg  Pulse 64  Ht 5\' 7"  (1.702 m)  Wt 236 lb 1.9 oz (107.103 kg)  BMI 36.97 kg/m2  SpO2 98% Patient is alert and in no acute distress. She is obese. Skin is warm and dry. Color is normal.  HEENT is unremarkable. Normocephalic/atraumatic. PERRL. Sclera are nonicteric. Neck is supple. No masses. No JVD. Lungs are clear. No wheezes. Cardiac exam shows a regular rate and rhythm. Soft systolic murmur noted. Abdomen is soft. Extremities are without edema. Gait and ROM are intact. No gross neurologic deficits noted.  Wt Readings from Last 3 Encounters:  02/14/14 236 lb 1.9 oz (107.103 kg)  01/10/14 233 lb (105.688 kg)  12/20/13 234 lb (106.142 kg)    LABORATORY DATA/PROCEDURES:  Lab Results  Component Value Date   WBC 5.2 12/16/2013   HGB 12.6 12/16/2013   HCT 37.4 12/16/2013   PLT 158.0 12/16/2013   GLUCOSE 95 12/16/2013   CHOL 358* 03/07/2012   TRIG 145 03/07/2012   HDL 77 03/07/2012   LDLDIRECT 321.8 02/27/2012   LDLCALC 252* 03/07/2012   ALT 14 12/16/2013   AST 20 12/16/2013   NA 138 12/16/2013   K 3.8 12/16/2013   CL 106 12/16/2013   CREATININE 1.0 12/16/2013   BUN 16 12/16/2013   CO2 24 12/16/2013   TSH 1.38 12/16/2013   INR 1.00 10/14/2011    BNP (last 3 results) No results for input(s): PROBNP in the last 8760 hours.  Myoview Impression from October 2015 Exercise Capacity: [place] with no exercise. BP Response: Normal blood pressure response. Clinical Symptoms: No chest pain. ECG Impression: No significant ECG changes with Lexiscan. Comparison with Prior Nuclear Study: No images to compare  Overall Impression: Low risk stress nuclear study. There is a medium size defect of moderate severity involving the basal and mid inferior and inferolateral segments. There is no ischemia.   LV Ejection Fraction: 49%. LV Wall Motion: Normal Wall Motion  Darlin Coco MD    Echo Study Conclusions from November 2013  -  Left ventricle: The cavity size was normal. Wall thickness was normal. Systolic function was vigorous. The estimated ejection fraction was in the range of 65% to 70%. - Mitral valve: Mild regurgitation. - Pulmonary arteries: Systolic pressure was mildly increased. PA peak pressure: 51mm Hg (S).    CXR MPRESSION: No active cardiopulmonary disease.   Electronically Signed  By: Kerby Moors M.D.  On: 12/16/2013 15:25  Assessment / Plan: 1. Chest pain - known CAD with recent stable Myoview  findings -  She denies further symptoms but is more bothered by her URI  2. HLD - on Crestor  3. Tobacco abuse - stopped  4. Mitral Regurgitation - for echo in November of 2016 per Dr. Angelena Form  5. Lupus   6. Cough/URI - will give Amoxicillin 875 BID for one week - needs to see her PCP if does not clear. Total smoking cessation encouraged. She is on ACE but this does not sound ACE related.   See back in 6 months. Defer handicap sticker to Dr. Angelena Form.   Patient is agreeable to this plan and will call if any problems develop in the interim.   Burtis Junes, RN, [facility] [address] [place], [place]  [address] [phone]

## 2014-02-14 NOTE — Patient Instructions (Addendum)
Your stress test looked ok  Stay on your current medicines  I have sent in a RX for Amoxicillin 875 mg to take twice a day for one week - if this does not help - please see your primary care doctor  I will give your handicap form to Dr. Angelena Form to sign  See Dr. Angelena Form in 6 months  No smoking!!  Call the [place] office at [phone] if you have any questions, problems or concerns.

## 2014-02-20 ENCOUNTER — Telehealth: Payer: Self-pay | Admitting: *Deleted

## 2014-02-20 NOTE — Telephone Encounter (Signed)
Spoke with pt and let her know Dr. Angelena Form had completed disability parking placard application. She would like this mailed to her.  I confirmed her home address listed in chart.  Will mail.

## 2014-02-23 ENCOUNTER — Encounter: Payer: Self-pay | Admitting: Internal Medicine

## 2014-02-23 ENCOUNTER — Ambulatory Visit (INDEPENDENT_AMBULATORY_CARE_PROVIDER_SITE_OTHER): Payer: Medicare Other | Admitting: Internal Medicine

## 2014-02-23 VITALS — BP 132/88 | HR 72 | Temp 98.2°F | Resp 16 | Ht 67.0 in | Wt 240.4 lb

## 2014-02-23 DIAGNOSIS — I259 Chronic ischemic heart disease, unspecified: Secondary | ICD-10-CM

## 2014-02-23 DIAGNOSIS — Z299 Encounter for prophylactic measures, unspecified: Secondary | ICD-10-CM

## 2014-02-23 DIAGNOSIS — M3214 Glomerular disease in systemic lupus erythematosus: Secondary | ICD-10-CM | POA: Diagnosis not present

## 2014-02-23 DIAGNOSIS — Z418 Encounter for other procedures for purposes other than remedying health state: Secondary | ICD-10-CM

## 2014-02-23 DIAGNOSIS — R059 Cough, unspecified: Secondary | ICD-10-CM

## 2014-02-23 DIAGNOSIS — R05 Cough: Secondary | ICD-10-CM

## 2014-02-23 DIAGNOSIS — Z23 Encounter for immunization: Secondary | ICD-10-CM | POA: Diagnosis not present

## 2014-02-23 DIAGNOSIS — Z0000 Encounter for general adult medical examination without abnormal findings: Secondary | ICD-10-CM | POA: Diagnosis not present

## 2014-02-23 MED ORDER — BENZONATATE 100 MG PO CAPS: 100.0000 mg | ORAL_CAPSULE | Freq: Three times a day (TID) | ORAL | Status: DC | PRN

## 2014-02-23 NOTE — Patient Instructions (Signed)
We will send in some Tessalon Perles that you can use for cough. You can take them up to 3 times a day for cough. They will not make you drowsy or sleepy.  We will see you back in the next 6 months or so to check on your thyroid and make sure that everything is going well. If you have any new problems or questions before then please feel free to call our office.

## 2014-02-23 NOTE — Progress Notes (Signed)
Pre visit review using our clinic review tool, if applicable. No additional management support is needed unless otherwise documented below in the visit note. 

## 2014-02-24 NOTE — Assessment & Plan Note (Signed)
Due to history of splenectomy given pneumonia shot today and would advise continued pneumonia shots every 5 years.

## 2014-02-24 NOTE — Progress Notes (Signed)
   Subjective:    Patient ID: [name], female    DOB: [birth year], 58 y.o.   MRN: [id number]  HPI Patient is a 58 year old female who comes in today to establish care. She has had a lingering cough since she got her flu shot in August and she believes that it is related. She states she occasionally has productive cough. She is immune suppressed and has history of ITP in the past, she still has lupus with nephritis. Otherwise she is doing well and denies any new joint pain swelling. She continues to follow with rheumatology as well as nephrology.  Review of Systems  Constitutional: Negative for fever, activity change, appetite change, fatigue and unexpected weight change.  HENT: Negative.   Respiratory: Positive for cough. Negative for shortness of breath and wheezing.   Cardiovascular: Negative for chest pain, palpitations and leg swelling.  Gastrointestinal: Negative.   Musculoskeletal: Negative.   Skin: Negative.   Neurological: Negative.       Objective:   Physical Exam  Constitutional: She appears well-developed and well-nourished.  HENT:  Head: Normocephalic and atraumatic.  Eyes: EOM are normal.  Neck: Normal range of motion.  Cardiovascular: Normal rate and regular rhythm.   Pulmonary/Chest: Effort normal. No respiratory distress. She has no wheezes. She has no rales.  Abdominal: Soft. Bowel sounds are normal. She exhibits no distension. There is no tenderness. There is no rebound.  Musculoskeletal: She exhibits no edema.  Neurological: Coordination normal.  Skin: Skin is warm and dry.   Filed Vitals:   02/23/14 0958  BP: 132/88  Pulse: 72  Temp: 98.2 F (36.8 C)  TempSrc: Oral  Resp: 16  Height: 5\' 7"  (1.702 m)  Weight: 240 lb 6.4 oz (109.045 kg)  SpO2: 98%      Assessment & Plan:  Patient given pneumonia shot.

## 2014-02-24 NOTE — Assessment & Plan Note (Signed)
At this time appears to be lingering from previous cold. Rx for [employer] Several chest x-ray without sign of pneumonia and lung exam clear today. She does take ACE inhibitor and if she continues with cough that is worrisome to her may consider changing to ARB. Symptoms not classic for ACE inhibitor cough.

## 2014-02-24 NOTE — Assessment & Plan Note (Signed)
Given her concurrent lupus at low threshold to initiate cardiac workup for any chest pains.

## 2014-02-24 NOTE — Assessment & Plan Note (Signed)
She is stable on azathioprine and Plaquenil. She gets yearly eye exams. She follows with rheumatology as well as nephrology.

## 2014-03-07 ENCOUNTER — Other Ambulatory Visit: Payer: Self-pay | Admitting: Cardiovascular Disease

## 2014-04-17 DIAGNOSIS — Z79899 Other long term (current) drug therapy: Secondary | ICD-10-CM | POA: Diagnosis not present

## 2014-04-17 DIAGNOSIS — H04123 Dry eye syndrome of bilateral lacrimal glands: Secondary | ICD-10-CM | POA: Diagnosis not present

## 2014-05-04 ENCOUNTER — Other Ambulatory Visit: Payer: Self-pay | Admitting: Cardiovascular Disease

## 2014-05-17 DIAGNOSIS — M3214 Glomerular disease in systemic lupus erythematosus: Secondary | ICD-10-CM | POA: Diagnosis not present

## 2014-05-17 DIAGNOSIS — Z79899 Other long term (current) drug therapy: Secondary | ICD-10-CM | POA: Diagnosis not present

## 2014-05-17 DIAGNOSIS — M329 Systemic lupus erythematosus, unspecified: Secondary | ICD-10-CM | POA: Diagnosis not present

## 2014-05-17 DIAGNOSIS — M2550 Pain in unspecified joint: Secondary | ICD-10-CM | POA: Diagnosis not present

## 2014-06-03 ENCOUNTER — Other Ambulatory Visit: Payer: Self-pay | Admitting: Internal Medicine

## 2014-06-29 ENCOUNTER — Other Ambulatory Visit: Payer: Self-pay | Admitting: Cardiovascular Disease

## 2014-06-29 NOTE — Telephone Encounter (Signed)
Burtis Junes, NP at 02/14/2014 8:03 AM  atenolol (TENORMIN) 25 MG tablet TAKE 1 TABLET BY MOUTH EVERY DAY pantoprazole (PROTONIX) 40 MG tabletTAKE 1 TABLET BY MOUTH EVERY DAY ramipril (ALTACE) 1.25 MG capsule TAKE 1 CAPSULE BY MOUTH DAILY  Stay on your current medicines

## 2014-07-14 ENCOUNTER — Other Ambulatory Visit: Payer: Self-pay | Admitting: Cardiovascular Disease

## 2014-08-16 DIAGNOSIS — M3214 Glomerular disease in systemic lupus erythematosus: Secondary | ICD-10-CM | POA: Diagnosis not present

## 2014-08-16 DIAGNOSIS — M329 Systemic lupus erythematosus, unspecified: Secondary | ICD-10-CM | POA: Diagnosis not present

## 2014-08-16 DIAGNOSIS — M2550 Pain in unspecified joint: Secondary | ICD-10-CM | POA: Diagnosis not present

## 2014-08-16 DIAGNOSIS — Z79899 Other long term (current) drug therapy: Secondary | ICD-10-CM | POA: Diagnosis not present

## 2014-08-23 ENCOUNTER — Ambulatory Visit (INDEPENDENT_AMBULATORY_CARE_PROVIDER_SITE_OTHER): Payer: Medicare Other | Admitting: Internal Medicine

## 2014-08-23 ENCOUNTER — Encounter: Payer: Self-pay | Admitting: Internal Medicine

## 2014-08-23 VITALS — BP 118/80 | HR 62 | Temp 97.9°F | Wt 247.0 lb

## 2014-08-23 DIAGNOSIS — J0140 Acute pansinusitis, unspecified: Secondary | ICD-10-CM | POA: Insufficient documentation

## 2014-08-23 DIAGNOSIS — J019 Acute sinusitis, unspecified: Secondary | ICD-10-CM

## 2014-08-23 DIAGNOSIS — R05 Cough: Secondary | ICD-10-CM | POA: Diagnosis not present

## 2014-08-23 DIAGNOSIS — R059 Cough, unspecified: Secondary | ICD-10-CM

## 2014-08-23 MED ORDER — AMOXICILLIN-POT CLAVULANATE 875-125 MG PO TABS: 1.0000 | ORAL_TABLET | Freq: Two times a day (BID) | ORAL | Status: DC

## 2014-08-23 MED ORDER — PROMETHAZINE-CODEINE 6.25-10 MG/5ML PO SYRP: 5.0000 mL | ORAL_SOLUTION | ORAL | Status: DC | PRN

## 2014-08-23 MED ORDER — BENZONATATE 200 MG PO CAPS: 200.0000 mg | ORAL_CAPSULE | Freq: Three times a day (TID) | ORAL | Status: DC | PRN

## 2014-08-23 NOTE — Assessment & Plan Note (Addendum)
Worse. Hold Altace x 1 week We can try Losartan

## 2014-08-23 NOTE — Progress Notes (Signed)
Subjective:  Patient ID: [name], female    DOB: [birth year]  Age: 59 y.o. MRN: [id number]  CC: Cough   HPI [name] presents for cough, sinus congestion, brown d/c. Pt took a Zpac from Dr Trudie Reed. Pt has lupus. Pt just graduated  Outpatient Prescriptions Prior to Visit  Medication Sig Dispense Refill  . aspirin 81 MG chewable tablet Chew 81 mg by mouth every morning.     Marland Kitchen atenolol (TENORMIN) 25 MG tablet TAKE 1 TABLET BY MOUTH EVERY DAY 90 tablet 1  . azaTHIOprine (IMURAN) 50 MG tablet Take 50 mg by mouth 2 (two) times daily.    . furosemide (LASIX) 40 MG tablet Take 40 mg by mouth as needed.    . hydroxychloroquine (PLAQUENIL) 200 MG tablet     . levothyroxine (SYNTHROID, LEVOTHROID) 50 MCG tablet TAKE 1 TABLET EVERY DAY 90 tablet 3  . nitroGLYCERIN (NITROSTAT) 0.4 MG SL tablet Place 0.4 mg under the tongue every 5 (five) minutes x 3 doses as needed. For chest pain    . pantoprazole (PROTONIX) 40 MG tablet TAKE 1 TABLET BY MOUTH EVERY DAY 90 tablet 1  . ramipril (ALTACE) 1.25 MG capsule TAKE ONE CAPSULE BY MOUTH EVERY DAY 90 capsule 1  . CRESTOR 20 MG tablet TAKE 1 TABLET AT BEDTIME (Patient not taking: Reported on 08/23/2014) 30 tablet 11  . benzonatate (TESSALON) 100 MG capsule Take 1 capsule (100 mg total) by mouth 3 (three) times daily as needed for cough. (Patient not taking: Reported on 08/23/2014) 60 capsule 0   No facility-administered medications prior to visit.    ROS Review of Systems  Objective:  BP 118/80 mmHg  Pulse 62  Temp(Src) 97.9 F (36.6 C) (Oral)  Wt 247 lb (112.038 kg)  SpO2 97%  BP Readings from Last 3 Encounters:  08/23/14 118/80  02/23/14 132/88  02/14/14 112/82    Wt Readings from Last 3 Encounters:  08/23/14 247 lb (112.038 kg)  02/23/14 240 lb 6.4 oz (109.045 kg)  02/14/14 236 lb 1.9 oz (107.103 kg)    Physical Exam  Lab Results  Component Value Date   WBC 5.2 12/16/2013   HGB 12.6 12/16/2013   HCT 37.4 12/16/2013   PLT 158.0 12/16/2013   GLUCOSE 95 12/16/2013   CHOL 358* 03/07/2012   TRIG 145 03/07/2012   HDL 77 03/07/2012   LDLDIRECT 321.8 02/27/2012   LDLCALC 252* 03/07/2012   ALT 14 12/16/2013   AST 20 12/16/2013   NA 138 12/16/2013   K 3.8 12/16/2013   CL 106 12/16/2013   CREATININE 1.0 12/16/2013   BUN 16 12/16/2013   CO2 24 12/16/2013   TSH 1.38 12/16/2013   INR 1.00 10/14/2011    Dg Chest 2 View  12/16/2013   CLINICAL DATA:  Cough.  Shortness of breath and chest pain  EXAM: CHEST  2 VIEW  COMPARISON:  03/09/2012  FINDINGS: The heart size and mediastinal contours are within normal limits. Both lungs are clear. The visualized skeletal structures are unremarkable.  IMPRESSION: No active cardiopulmonary disease.   Electronically Signed   By: Kerby Moors M.D.   On: 12/16/2013 15:25    Assessment & Plan:   [name] was seen today for cough.  Diagnoses and all orders for this visit:  Cough  Acute sinusitis, recurrence not specified, unspecified location  Other orders -     amoxicillin-clavulanate (AUGMENTIN) 875-125 MG per tablet; Take 1 tablet by mouth 2 (two) times daily. -  promethazine-codeine (PHENERGAN WITH CODEINE) 6.25-10 MG/5ML syrup; Take 5 mLs by mouth every 4 (four) hours as needed. -     benzonatate (TESSALON) 200 MG capsule; Take 1 capsule (200 mg total) by mouth 3 (three) times daily as needed for cough.  I have discontinued [name]'s benzonatate. I am also having her start on amoxicillin-clavulanate, promethazine-codeine, and benzonatate. Additionally, I am having her maintain her nitroGLYCERIN, aspirin, hydroxychloroquine, azaTHIOprine, CRESTOR, furosemide, levothyroxine, atenolol, ramipril, and pantoprazole.  Meds ordered this encounter  Medications  . amoxicillin-clavulanate (AUGMENTIN) 875-125 MG per tablet    Sig: Take 1 tablet by mouth 2 (two) times daily.    Dispense:  20 tablet    Refill:  0  . promethazine-codeine (PHENERGAN WITH CODEINE) 6.25-10  MG/5ML syrup    Sig: Take 5 mLs by mouth every 4 (four) hours as needed.    Dispense:  300 mL    Refill:  0  . benzonatate (TESSALON) 200 MG capsule    Sig: Take 1 capsule (200 mg total) by mouth 3 (three) times daily as needed for cough.    Dispense:  60 capsule    Refill:  0     Follow-up: No Follow-up on file.  Walker Kehr, MD

## 2014-08-23 NOTE — Progress Notes (Signed)
Pre visit review using our clinic review tool, if applicable. No additional management support is needed unless otherwise documented below in the visit note. 

## 2014-08-25 ENCOUNTER — Ambulatory Visit: Payer: Medicare Other | Admitting: Internal Medicine

## 2014-08-30 NOTE — Progress Notes (Signed)
Chief Complaint  Patient presents with  . Cough     History of Present Illness: 59 yo AAF with h/o CAD s/p bare metal stent RCA and Circumflex 2003, PTCA Diagonal 2003, hypothyroidism, moderate MR, GERD, lupus here today for cardiac follow up. She had previously been followed in [facility] by Dr. Melvern Banker. I saw her as a new patient in November 2010. She has had pain in her back and in both legs at night. No exertional leg pain. Lower ext dopplers with no evidence of PAD. Most recent ABI May 2013 normal. Last echo 02/18/12 with normal LV size and function with mild MR. She was admitted to [facility] December 2013 with massive volume overload. She was followed by Nephrology. Her acute illness was felt to be due to lupus flare. Exercise stress myoview 01/10/14 with no ischemia.   She is here today for follow up. No chest pain, SOB or LE swelling. She is rarely smoking. She stopped her statin due to her "skin turning colors". She refuses to restart statins.   Primary Care Physician:  Wayne Sever    Past Medical History   Diagnosis  Date   .  Peripheral vascular disease, unspecified    .  Esophageal reflux    .  Mitral valve insufficiency and aortic valve insufficiency    .  Immune thrombocytopenic purpura    .  Unspecified disease of pericardium    .  Chronic ischemic heart disease, unspecified      w/ stents in mid circumflex and mid RCA in 03, and balloon angioplasty of Diagonal branch 10-03, last cath Sept 08- 2 vessel disease w/ patent stents   .  Lupus erythematosus    .  Hypothyroidism     Past Surgical History   Procedure  Laterality  Date   .  Plastic surgical repair       dog bite to leg   .  Splenectomy   5-04   .  Cholecystectomy, laparoscopic   2010    Current Outpatient Prescriptions   Medication  Sig  Dispense  Refill   .  aspirin 81 MG chewable tablet  Chew 81 mg by mouth every morning.     Marland Kitchen  atenolol (TENORMIN) 25 MG tablet  Take 25 mg by mouth every  morning.     .  furosemide (LASIX) 40 MG tablet  Take 1 tablet (40 mg total) by mouth every morning.  30 tablet  2   .  hydroxychloroquine (PLAQUENIL) 200 MG tablet      .  levothyroxine (SYNTHROID, LEVOTHROID) 50 MCG tablet  TAKE 1 TABLET (50 MCG TOTAL) BY MOUTH DAILY.  30 tablet  5   .  nitroGLYCERIN (NITROSTAT) 0.4 MG SL tablet  Place 0.4 mg under the tongue every 5 (five) minutes x 3 doses as needed. For chest pain     .  ondansetron (ZOFRAN) 4 MG tablet  Take 1 tablet (4 mg total) by mouth every 8 (eight) hours as needed for nausea.  20 tablet  0   .  pantoprazole (PROTONIX) 40 MG tablet  TAKE 1 TABLET BY MOUTH EVERY DAY  30 tablet  1   .  predniSONE (DELTASONE) 5 MG tablet      .  ramipril (ALTACE) 1.25 MG capsule  TAKE 1 CAPSULE BY MOUTH DAILY  30 capsule  1   .  rosuvastatin (CRESTOR) 20 MG tablet  Take 1 tablet (20 mg total) by mouth at bedtime.  30 tablet  11    No current facility-administered medications for this visit.   No Known Allergies  History    Social History   .  Marital Status:  Single     Spouse Name:  N/A     Number of Children:  N/A   .  Years of Education:  N/A    Occupational History   .  Not on file.    Social History Main Topics   .  Smoking status:  Light Tobacco Smoker -- 0.50 packs/day for 30 years     Types:  Cigarettes     Last Attempt to Quit:  12/23/2011   .  Smokeless tobacco:  Never Used   .  Alcohol Use:  No   .  Drug Use:  No   .  Sexually Active:  No    Other Topics  Concern   .  Not on file    Social History Narrative    HSG, graduated from [employer] in [place]. In college [name] -grad '13 with relgious studies. [employer] - Fall '13 for MA-divinity. Marrried '73 - 2 years/ divorced. 2 son- '73, '75 - CP.    Occupation: full-time Ship broker. She lives alone with her younger son living with her part-time but he resides in a managed care facility.    Family History   Problem  Relation  Age of Onset   .  Cancer  Mother       breast and cervical   .  Paget's disease of bone  Mother    .  Fibromyalgia  Mother    .  Cancer  Paternal Grandfather      colon   .  Heart attack  Other    .  Coronary artery disease  Other    .  Diabetes  Other    .  Hypertension  Other    .  Hyperlipidemia  Other    .  Hypertension  Father    .  Heart disease  Father      PVD   .  GER disease  Father    Review of Systems: As stated in the HPI and otherwise negative.   Filed Vitals:   08/31/14 0913  BP: 120/84  Pulse: 62    Physical Examination:  General: Well developed, well nourished, NAD  HEENT: OP clear, mucus membranes moist  SKIN: warm, dry. No rashes.  Neuro: No focal deficits  Musculoskeletal: Muscle strength 5/5 all ext  Psychiatric: Mood and affect normal  Neck: No JVD, no carotid bruits, no thyromegaly, no lymphadenopathy.  Lungs:Clear bilaterally, no wheezes, rhonci, crackles  Cardiovascular: Regular rate and rhythm. No murmurs, gallops or rubs.  Abdomen:Soft. Bowel sounds present. Non-tender.  Extremities: No lower extremity edema. Pulses are 2 + in the bilateral DP/PT.   Echo 02/18/12:  Left ventricle: The cavity size was normal. Wall thickness was normal. Systolic function was vigorous. The estimated ejection fraction was in the range of 65% to 70%. - Mitral valve: Mild regurgitation. - Pulmonary arteries: Systolic pressure was mildly increased. PA peak pressure: 25mm Hg (S).  EKG:  EKG is ordered today. The ekg ordered today demonstrates NSR, 62 bpm.   Recent Labs: 12/16/2013: ALT 14; BUN 16; Creatinine, Ser 1.0; Hemoglobin 12.6; Platelets 158.0; Potassium 3.8; Sodium 138; TSH 1.38   Lipid Panel    Component Value Date/Time   CHOL 358* 03/07/2012 0350   TRIG 145 03/07/2012 0350   HDL 77 03/07/2012 0350  CHOLHDL 4.6 03/07/2012 0350   VLDL 29 03/07/2012 0350   LDLCALC 252* 03/07/2012 0350   LDLDIRECT 321.8 02/27/2012 1202     Wt Readings from Last 3 Encounters:  08/31/14 246 lb 12.8 oz  (111.948 kg)  08/23/14 247 lb (112.038 kg)  02/23/14 240 lb 6.4 oz (109.045 kg)     Other studies Reviewed: Additional studies/ records that were reviewed today include:  Review of the above records demonstrates:    Assessment and Plan:   1. CAD: Stable. Cough with Ace-inh. Will stop Ramipril and start Cozaar 25 mg daily. Continue other cardiac meds. She is off of her statin due to skin changes.   2. TOBACCO ABUSE, in remission: She is still smoking.  Cessation counseling provided  3. Mitral regurgitation: Mild by echo 2013. Will repeat echo now  4. Hyperlipidemia: LDL is high. She has failed multiple statins due to intolerance. Will refer to Porfirio Oar, PharmD for consideration of PCSK9 inh therapy.   Current medicines are reviewed at length with the patient today.  The patient does not have concerns regarding medicines.  The following changes have been made:  no change  Labs/ tests ordered today include:   Orders Placed This Encounter  Procedures  . EKG 12-Lead  . Echocardiogram    Disposition:   FU with me in 12  months  Signed, Lauree Chandler, MD 08/31/2014 12:29 PM    [address], [place], [place]  [address] Phone: [phone]; Fax: [phone]

## 2014-08-31 ENCOUNTER — Encounter: Payer: Self-pay | Admitting: Cardiovascular Disease

## 2014-08-31 ENCOUNTER — Encounter: Payer: Self-pay | Admitting: Internal Medicine

## 2014-08-31 ENCOUNTER — Other Ambulatory Visit (INDEPENDENT_AMBULATORY_CARE_PROVIDER_SITE_OTHER): Payer: Medicare Other

## 2014-08-31 ENCOUNTER — Ambulatory Visit (INDEPENDENT_AMBULATORY_CARE_PROVIDER_SITE_OTHER): Payer: Medicare Other | Admitting: Internal Medicine

## 2014-08-31 ENCOUNTER — Ambulatory Visit (INDEPENDENT_AMBULATORY_CARE_PROVIDER_SITE_OTHER): Payer: Medicare Other | Admitting: Cardiovascular Disease

## 2014-08-31 VITALS — BP 120/84 | HR 62 | Ht 67.0 in | Wt 246.8 lb

## 2014-08-31 VITALS — BP 114/80 | HR 71 | Temp 98.2°F | Resp 16 | Ht 67.0 in | Wt 247.4 lb

## 2014-08-31 DIAGNOSIS — I340 Nonrheumatic mitral (valve) insufficiency: Secondary | ICD-10-CM

## 2014-08-31 DIAGNOSIS — Z720 Tobacco use: Secondary | ICD-10-CM | POA: Diagnosis not present

## 2014-08-31 DIAGNOSIS — E039 Hypothyroidism, unspecified: Secondary | ICD-10-CM | POA: Diagnosis not present

## 2014-08-31 DIAGNOSIS — R059 Cough, unspecified: Secondary | ICD-10-CM

## 2014-08-31 DIAGNOSIS — I2510 Atherosclerotic heart disease of native coronary artery without angina pectoris: Secondary | ICD-10-CM

## 2014-08-31 DIAGNOSIS — E785 Hyperlipidemia, unspecified: Secondary | ICD-10-CM | POA: Diagnosis not present

## 2014-08-31 DIAGNOSIS — J019 Acute sinusitis, unspecified: Secondary | ICD-10-CM

## 2014-08-31 DIAGNOSIS — R05 Cough: Secondary | ICD-10-CM

## 2014-08-31 LAB — TSH: TSH: 1.06 u[IU]/mL (ref 0.35–4.50)

## 2014-08-31 LAB — BASIC METABOLIC PANEL
BUN: 12 mg/dL (ref 6–23)
CO2: 29 mEq/L (ref 19–32)
Calcium: 9.1 mg/dL (ref 8.4–10.5)
Chloride: 109 mEq/L (ref 96–112)
Creatinine, Ser: 1.01 mg/dL (ref 0.40–1.20)
GFR: 72.24 mL/min (ref >60.00)
Glucose, Bld: 83 mg/dL (ref 70–99)
Potassium: 4.6 mEq/L (ref 3.5–5.1)
Sodium: 139 mEq/L (ref 135–145)

## 2014-08-31 MED ORDER — LOSARTAN POTASSIUM 25 MG PO TABS: 25.0000 mg | ORAL_TABLET | Freq: Every day | ORAL | Status: DC

## 2014-08-31 NOTE — Patient Instructions (Signed)
Medication Instructions:  Your physician has recommended you make the following change in your medication: Stop Ramipril.  Start Losartan 25 mg by mouth daily   Labwork: none  Testing/Procedures: Your physician has requested that you have an echocardiogram. Echocardiography is a painless test that uses sound waves to create images of your heart. It provides your doctor with information about the size and shape of your heart and how well your heart's chambers and valves are working. This procedure takes approximately one hour. There are no restrictions for this procedure.  Please schedule appointment for pt to see Ronnald Collum, PharmD to discuss new cholesterol medications.   Follow-Up: Your physician wants you to follow-up in: 6 months.  You will receive a reminder letter in the mail two months in advance. If you don't receive a letter, please call our office to schedule the follow-up appointment.   Any Other Special Instructions Will Be Listed Below (If Applicable).

## 2014-08-31 NOTE — Assessment & Plan Note (Signed)
Resolved at this time and talked to her about duration of cough versus acute illness.

## 2014-08-31 NOTE — Patient Instructions (Signed)
We will check on the thyroid today and call you back with the results.   Stopping the ramipril could take 2-3 months to get rid of the cough (if this was the cause). Keep taking the losartan instead.   Come back in about 6 months for a physical. Come back sooner if the cough is not better.   Exercise to Lose Weight Exercise and a healthy diet may help you lose weight. Your doctor may suggest specific exercises. EXERCISE IDEAS AND TIPS  Choose low-cost things you enjoy doing, such as walking, bicycling, or exercising to workout videos.  Take stairs instead of the elevator.  Walk during your lunch break.  Park your car further away from work or school.  Go to a gym or an exercise class.  Start with 5 to 10 minutes of exercise each day. Build up to 30 minutes of exercise 4 to 6 days a week.  Wear shoes with good support and comfortable clothes.  Stretch before and after working out.  Work out until you breathe harder and your heart beats faster.  Drink extra water when you exercise.  Do not do so much that you hurt yourself, feel dizzy, or get very short of breath. Exercises that burn about 150 calories:  Running 1  miles in 15 minutes.  Playing volleyball for 45 to 60 minutes.  Washing and waxing a car for 45 to 60 minutes.  Playing touch football for 45 minutes.  Walking 1  miles in 35 minutes.  Pushing a stroller 1  miles in 30 minutes.  Playing basketball for 30 minutes.  Raking leaves for 30 minutes.  Bicycling 5 miles in 30 minutes.  Walking 2 miles in 30 minutes.  Dancing for 30 minutes.  Shoveling snow for 15 minutes.  Swimming laps for 20 minutes.  Walking up stairs for 15 minutes.  Bicycling 4 miles in 15 minutes.  Gardening for 30 to 45 minutes.  Jumping rope for 15 minutes.  Washing windows or floors for 45 to 60 minutes. Document Released: 04/12/2010 Document Revised: 06/02/2011 Document Reviewed: 04/12/2010 [facility] Patient  Information 2015 [place], [place]. This information is not intended to replace advice given to you by your health care provider. Make sure you discuss any questions you have with your health care provider.

## 2014-08-31 NOTE — Assessment & Plan Note (Signed)
She will stay on losartan instead of ramipril and advised her that the switch can take 2-3 months until cough is fully resolved (if from ACE-I).

## 2014-08-31 NOTE — Progress Notes (Signed)
Pre visit review using our clinic review tool, if applicable. No additional management support is needed unless otherwise documented below in the visit note. 

## 2014-08-31 NOTE — Assessment & Plan Note (Signed)
Checking TSH today and adjust therapy as needed. Currently on 50 mcg daily synthroid.

## 2014-08-31 NOTE — Progress Notes (Signed)
   Subjective:    Patient ID: [name], female    DOB: 10-26-1955, 59 y.o.   MRN: [id number]  HPI The patient is a 59 YO female who is coming in to follow up on her thyroid. Still taking her medicine everyday. No new symptoms of hot or cold intolerance. No tremors or constipation or diarrhea. No side effects of medicine. Also she is still having a cough after taking 2 antibiotics (one at her rheumatologist several weeks ago and 1 from our office last week). She is feeling somewhat better but not tons. She also was changed from ACE-I to ARB last week although she still took the ramipril this morning. She thinks that this is helping. She is also smoking but rarely. No fevers or chills. Denies nasal congestion or ear pain.   Review of Systems  Constitutional: Negative for fever, activity change, appetite change, fatigue and unexpected weight change.  HENT: Negative.   Eyes: Negative.   Respiratory: Positive for cough. Negative for chest tightness, shortness of breath and wheezing.   Cardiovascular: Negative for chest pain, palpitations and leg swelling.  Gastrointestinal: Negative for nausea, abdominal pain, diarrhea, constipation and abdominal distention.  Musculoskeletal: Negative.   Skin: Negative.       Objective:   Physical Exam  Constitutional: She is oriented to person, place, and time. She appears well-developed and well-nourished.  HENT:  Head: Normocephalic and atraumatic.  Eyes: EOM are normal.  Neck: Normal range of motion.  Cardiovascular: Normal rate and regular rhythm.   Pulmonary/Chest: Effort normal and breath sounds normal. No respiratory distress. She has no wheezes.  Abdominal: Soft. She exhibits no distension. There is no tenderness. There is no rebound.  Musculoskeletal: She exhibits no edema.  Neurological: She is alert and oriented to person, place, and time. Coordination normal.  Skin: Skin is warm and dry.  Psychiatric: She has a normal mood and affect.    Filed Vitals:   08/31/14 1331  BP: 114/80  Pulse: 71  Temp: 98.2 F (36.8 C)  TempSrc: Oral  Resp: 16  Height: 5\' 7"  (1.702 m)  Weight: 247 lb 6.4 oz (112.22 kg)  SpO2: 98%      Assessment & Plan:

## 2014-09-18 ENCOUNTER — Ambulatory Visit: Payer: Medicare Other | Admitting: Pharmacist

## 2014-09-18 ENCOUNTER — Other Ambulatory Visit: Payer: Self-pay

## 2014-09-18 ENCOUNTER — Ambulatory Visit (HOSPITAL_COMMUNITY): Payer: Medicare Other | Attending: Cardiovascular Disease

## 2014-09-18 ENCOUNTER — Other Ambulatory Visit: Payer: Self-pay | Admitting: Pharmacist

## 2014-09-18 DIAGNOSIS — E785 Hyperlipidemia, unspecified: Secondary | ICD-10-CM

## 2014-09-18 DIAGNOSIS — I517 Cardiomegaly: Secondary | ICD-10-CM | POA: Insufficient documentation

## 2014-09-18 DIAGNOSIS — I351 Nonrheumatic aortic (valve) insufficiency: Secondary | ICD-10-CM | POA: Diagnosis not present

## 2014-09-18 DIAGNOSIS — I340 Nonrheumatic mitral (valve) insufficiency: Secondary | ICD-10-CM | POA: Diagnosis not present

## 2014-09-21 ENCOUNTER — Other Ambulatory Visit (INDEPENDENT_AMBULATORY_CARE_PROVIDER_SITE_OTHER): Payer: Medicare Other | Admitting: *Deleted

## 2014-09-21 DIAGNOSIS — E785 Hyperlipidemia, unspecified: Secondary | ICD-10-CM

## 2014-09-21 LAB — LIPID PANEL
Cholesterol: 190 mg/dL (ref 0–200)
HDL: 45.6 mg/dL (ref >39.00)
LDL Cholesterol: 128 mg/dL — ABNORMAL HIGH (ref 0–99)
NonHDL: 144.4
Total CHOL/HDL Ratio: 4
Triglycerides: 81 mg/dL (ref 0.0–149.0)
VLDL: 16.2 mg/dL (ref 0.0–40.0)

## 2014-09-21 LAB — HEPATIC FUNCTION PANEL
ALT: 11 U/L (ref 0–35)
AST: 21 U/L (ref 0–37)
Albumin: 3.4 g/dL — ABNORMAL LOW (ref 3.5–5.2)
Alkaline Phosphatase: 77 U/L (ref 39–117)
Bilirubin, Direct: 0.1 mg/dL (ref 0.0–0.3)
Total Bilirubin: 0.4 mg/dL (ref 0.2–1.2)
Total Protein: 6.8 g/dL (ref 6.0–8.3)

## 2014-09-21 NOTE — Addendum Note (Signed)
Addended by: Eulis Foster on: 09/21/2014 11:08 AM   Modules accepted: Orders

## 2014-09-27 ENCOUNTER — Ambulatory Visit (INDEPENDENT_AMBULATORY_CARE_PROVIDER_SITE_OTHER): Payer: Medicare Other | Admitting: Pharmacist

## 2014-09-27 DIAGNOSIS — I2510 Atherosclerotic heart disease of native coronary artery without angina pectoris: Secondary | ICD-10-CM

## 2014-09-27 DIAGNOSIS — E785 Hyperlipidemia, unspecified: Secondary | ICD-10-CM

## 2014-09-27 MED ORDER — ROSUVASTATIN CALCIUM 5 MG PO TABS: 5.0000 mg | ORAL_TABLET | Freq: Every day | ORAL | Status: DC

## 2014-09-27 NOTE — Patient Instructions (Signed)
Start Crestor 5mg  three days of the week (Monday, Wednesday and Friday).  If you have any problems, please call Gay Filler at [phone].   We will recheck your labs in 3 months.

## 2014-09-27 NOTE — Progress Notes (Signed)
59 year old African-American female with history of CAD w/ stents in mid circumflex and mid RCA in 2003, hypothyroidism, PVD, and lupus erythematosus presenting for lipid management, patient of Dr. Angelena Form. In the past, she has attempted to use numerous statins. Most recently, she was on Crestor 20mg  but discontinued medication around 11/2013 due to yellow skin discoloration as well as an increase in price of the medication. Of note, at the time of skin discoloration, patient was also on Cytoxan which is more likely the culprit of this adverse effect than Crestor.  She experienced muscle cramps with prior trials of atorvastatin, simvastatin, and pravastatin.  She endorses a family history positive for heart disease, especially on her mother's side of the family. Her mother has a pacemaker and is managed by Dr. Angelena Form here at [facility]. She has two brothers who passed away from having "holes in their heart."  Patient endorses a healthy diet absent of fried foods. She often eats oatmeal or yogurt for breakfast and a salad for lunch (although does admit to skipping lunch sometimes). She snacks on fruit and freeze-dried vegetables. Only endorses eating beef about once a month.    Of recent, patient has been increasing physical activity. Has a treadmill at home which she attempts to walk on 2-3x/wk for 30 minutes at a time. Has also started to take a few Zumba classes.  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  RF: ASCVD (s/p PCI 2003), family history Goals: LDL goal<70 mg/dL, non-HDL 100mg /dL Current Therapy: none Intolerances: Crestor 10mg  and 20mg  (yellow skin discoloration), simvastatin, Lipitor, pravastatin  Recent Labs:  09/21/2014: ALT 11 08/31/2014: SCr 1.01; K 4.6; TSH 1.06 12/16/2013: ALT 14; BUN 16; SCr 1.0; Hgb 12.6; Plt 158; K 3.8; Na 138; TSH 1.38  Lipid Panel: 09/21/2014: Chol 190; HDL 45.6; LDL 128; VLDL 16.2; TG 8  Current Outpatient  Prescriptions  Medication Sig Dispense Refill  . amoxicillin-clavulanate (AUGMENTIN) 875-125 MG per tablet Take 1 tablet by mouth 2 (two) times daily. 20 tablet 0  . aspirin 81 MG chewable tablet Chew 81 mg by mouth every morning.     Marland Kitchen atenolol (TENORMIN) 25 MG tablet TAKE 1 TABLET BY MOUTH EVERY DAY 90 tablet 1  . azaTHIOprine (IMURAN) 50 MG tablet Take 50 mg by mouth 2 (two) times daily.    . furosemide (LASIX) 40 MG tablet Take 40 mg by mouth as needed for fluid.     . hydroxychloroquine (PLAQUENIL) 200 MG tablet Take 200 mg by mouth daily.     Marland Kitchen levothyroxine (SYNTHROID, LEVOTHROID) 50 MCG tablet TAKE 1 TABLET EVERY DAY 90 tablet 3  . losartan (COZAAR) 25 MG tablet Take 1 tablet (25 mg total) by mouth daily. 90 tablet 3  . nitroGLYCERIN (NITROSTAT) 0.4 MG SL tablet Place 0.4 mg under the tongue every 5 (five) minutes x 3 doses as needed. For chest pain    . pantoprazole (PROTONIX) 40 MG tablet TAKE 1 TABLET BY MOUTH EVERY DAY 90 tablet 1  . promethazine-codeine (PHENERGAN WITH CODEINE) 6.25-10 MG/5ML syrup Take 5 mLs by mouth every 4 (four) hours as needed. 300 mL 0  . rosuvastatin (CRESTOR) 5 MG tablet Take 1 tablet (5 mg total) by mouth daily. Or as directed 30 tablet 1   No current facility-administered medications for this visit.   No Known Allergies  Past Medical History  Diagnosis Date  . Peripheral vascular disease, unspecified   . Esophageal reflux   .  Mitral valve insufficiency and aortic valve insufficiency   . Immune thrombocytopenic purpura   . Unspecified disease of pericardium   . Chronic ischemic heart disease, unspecified     w/ stents in mid circumflex and mid RCA in 03, and balloon angioplasty of Diagonal branch 10-03, last cath Sept 08- 2 vessel disease w/ patent stents  . Lupus erythematosus   . Hypothyroidism     Assessment and Plan: Hyperlipidemia: LDL is elevated at 128 although this is improved from three years ago. Given patient's elevated LDL, family  history of heart disease, and ASCVD, explained the importance of lowering LDL to <70. Educated patient about the option to try a new PCSK9 inhibitor although patient was hesitant about the price. Also discussed option of trying to start Crestor again at a lower dose than before. Crestor is now generic which should help with cost issue. Will initiate Crestor 5mg  three times a week. Will re-check lipid panel in 3 months and adjust therapy as needed.

## 2014-11-15 DIAGNOSIS — M329 Systemic lupus erythematosus, unspecified: Secondary | ICD-10-CM | POA: Diagnosis not present

## 2014-11-15 DIAGNOSIS — Z79899 Other long term (current) drug therapy: Secondary | ICD-10-CM | POA: Diagnosis not present

## 2014-11-15 DIAGNOSIS — R350 Frequency of micturition: Secondary | ICD-10-CM | POA: Diagnosis not present

## 2014-11-15 DIAGNOSIS — M3214 Glomerular disease in systemic lupus erythematosus: Secondary | ICD-10-CM | POA: Diagnosis not present

## 2014-11-15 DIAGNOSIS — R3911 Hesitancy of micturition: Secondary | ICD-10-CM | POA: Diagnosis not present

## 2014-12-28 ENCOUNTER — Other Ambulatory Visit (INDEPENDENT_AMBULATORY_CARE_PROVIDER_SITE_OTHER): Payer: PRIVATE HEALTH INSURANCE

## 2014-12-28 DIAGNOSIS — E785 Hyperlipidemia, unspecified: Secondary | ICD-10-CM

## 2014-12-28 LAB — HEPATIC FUNCTION PANEL
ALT: 14 U/L (ref 6–29)
AST: 22 U/L (ref 10–35)
Albumin: 3.6 g/dL (ref 3.6–5.1)
Alkaline Phosphatase: 67 U/L (ref 33–130)
Bilirubin, Direct: 0.1 mg/dL (ref <=0.2)
Indirect Bilirubin: 0.3 mg/dL (ref 0.2–1.2)
Total Bilirubin: 0.4 mg/dL (ref 0.2–1.2)
Total Protein: 6.7 g/dL (ref 6.1–8.1)

## 2014-12-28 LAB — LIPID PANEL
Cholesterol: 170 mg/dL (ref 125–200)
HDL: 50 mg/dL (ref >=46)
LDL Cholesterol: 108 mg/dL (ref <130)
Total CHOL/HDL Ratio: 3.4 Ratio (ref <=5.0)
Triglycerides: 60 mg/dL (ref <150)
VLDL: 12 mg/dL (ref <30)

## 2014-12-28 NOTE — Addendum Note (Signed)
Addended by: Velna Ochs on: 12/28/2014 09:31 AM   Modules accepted: Orders

## 2015-01-12 ENCOUNTER — Telehealth: Payer: Self-pay

## 2015-01-12 NOTE — Telephone Encounter (Signed)
Prior auth for Rosuvastatin 5 mg sent to [facility].

## 2015-01-15 NOTE — Telephone Encounter (Signed)
Spoke with pharmacy.  Rx for generic rosuvastatin went through with no issues.  No need for prior auth.

## 2015-01-15 NOTE — Telephone Encounter (Signed)
Duplicate encounter

## 2015-01-27 ENCOUNTER — Other Ambulatory Visit: Payer: Self-pay | Admitting: Cardiovascular Disease

## 2015-02-07 ENCOUNTER — Other Ambulatory Visit: Payer: Self-pay | Admitting: Cardiovascular Disease

## 2015-02-08 DIAGNOSIS — M329 Systemic lupus erythematosus, unspecified: Secondary | ICD-10-CM | POA: Diagnosis not present

## 2015-02-08 DIAGNOSIS — Z79899 Other long term (current) drug therapy: Secondary | ICD-10-CM | POA: Diagnosis not present

## 2015-02-08 DIAGNOSIS — M3214 Glomerular disease in systemic lupus erythematosus: Secondary | ICD-10-CM | POA: Diagnosis not present

## 2015-03-01 ENCOUNTER — Telehealth: Payer: Self-pay | Admitting: Internal Medicine

## 2015-03-01 NOTE — Telephone Encounter (Signed)
Patient states she is really sick with Lupus and is requesting Dr. Sharlet Salina to work her in tomorrow.  States she does not want to see anyone else. Please advise.

## 2015-03-01 NOTE — Telephone Encounter (Signed)
Dr. Sharlet Salina leaves at 2 pm tomorrow. There are no openings tomorrow.

## 2015-03-01 NOTE — Telephone Encounter (Signed)
Pt has an appt Monday with Dr. Sharlet Salina, offer appt with other pcp

## 2015-03-02 ENCOUNTER — Ambulatory Visit (INDEPENDENT_AMBULATORY_CARE_PROVIDER_SITE_OTHER): Payer: PRIVATE HEALTH INSURANCE | Admitting: Internal Medicine

## 2015-03-02 ENCOUNTER — Encounter: Payer: Self-pay | Admitting: Internal Medicine

## 2015-03-02 VITALS — BP 92/80 | HR 77 | Temp 99.7°F | Resp 18 | Ht 67.0 in | Wt 245.0 lb

## 2015-03-02 DIAGNOSIS — R05 Cough: Secondary | ICD-10-CM

## 2015-03-02 DIAGNOSIS — I2510 Atherosclerotic heart disease of native coronary artery without angina pectoris: Secondary | ICD-10-CM | POA: Diagnosis not present

## 2015-03-02 DIAGNOSIS — R059 Cough, unspecified: Secondary | ICD-10-CM

## 2015-03-02 MED ORDER — METHYLPREDNISOLONE ACETATE 40 MG/ML IJ SUSP
40.0000 mg | Freq: Once | INTRAMUSCULAR | Status: AC
  Administered 2015-03-02: 40 mg via INTRAMUSCULAR

## 2015-03-02 MED ORDER — AZITHROMYCIN 250 MG PO TABS: ORAL_TABLET | ORAL | Status: DC

## 2015-03-02 NOTE — Progress Notes (Signed)
   Subjective:    Patient ID: [name], female    DOB: [birth year], 59 y.o.   MRN: [id number]  HPI The patient is a 59 YO female coming in for fevers. She has been sick for 1-2 days and is worsening. She is not having body aches, does have fevers (101 this morning). She had flu shot in September. Some SOB and mild wheezing. Some nasal congestion. Has tried benadryl which did not help. Has been around some sick people. Some nausea but no diarrhea or vomiting.   Review of Systems  Constitutional: Positive for fever and chills. Negative for activity change, appetite change, fatigue and unexpected weight change.  HENT: Positive for congestion, postnasal drip and rhinorrhea. Negative for sinus pressure, sore throat and trouble swallowing.   Eyes: Negative.   Respiratory: Positive for cough, shortness of breath and wheezing. Negative for chest tightness.   Cardiovascular: Negative for chest pain, palpitations and leg swelling.  Gastrointestinal: Positive for nausea. Negative for abdominal pain, diarrhea, constipation and abdominal distention.  Musculoskeletal: Negative.   Skin: Negative.   Neurological: Negative.       Objective:   Physical Exam  Constitutional: She is oriented to person, place, and time. She appears well-developed and well-nourished.  HENT:  Head: Normocephalic and atraumatic.  Oropharynx with redness, mild clear discharge, turbinates swollen but no crusting. No sinus tenderness and ears clear.   Eyes: EOM are normal.  Neck: Normal range of motion.  Cardiovascular: Normal rate and regular rhythm.   Pulmonary/Chest: Effort normal. No respiratory distress. She has wheezes.  Mild coarse sounds diffuse, do not clear well with clearing.   Abdominal: Soft. She exhibits no distension. There is no tenderness. There is no rebound.  Musculoskeletal: She exhibits no edema.  Neurological: She is alert and oriented to person, place, and time. Coordination normal.  Skin: Skin is  warm and dry.  Psychiatric: She has a normal mood and affect.   Filed Vitals:   03/02/15 1119  BP: 92/80  Pulse: 77  Temp: 99.7 F (37.6 C)  TempSrc: Oral  Resp: 18  Height: 5\' 7"  (1.702 m)  Weight: 245 lb (111.131 kg)  SpO2: 99%      Assessment & Plan:  Depo-medrol 40 mg IM given at visit.

## 2015-03-02 NOTE — Progress Notes (Signed)
Pre visit review using our clinic review tool, if applicable. No additional management support is needed unless otherwise documented below in the visit note. 

## 2015-03-02 NOTE — Patient Instructions (Signed)
We have given you a steroid injection today to help out with the breathing. We are also having you start an antibiotic called azithromycin. Take 2 pills today then 1 pill a day after that until it is gone.   If you are not feeling better in 1 week or your symptoms worsen call the office back. If you have problems with your breathing please seek care at a hospital or urgent care.

## 2015-03-02 NOTE — Assessment & Plan Note (Signed)
At this time is acute on chronic. Given her chronic immunosuppression will treat more aggressively with depo-medrol 40 mg IM today and azithromycin. Advised that she can take OTC medicines for her symptoms.

## 2015-03-02 NOTE — Addendum Note (Signed)
Addended by: Resa Miner R on: 03/02/2015 04:47 PM   Modules accepted: Orders

## 2015-03-03 ENCOUNTER — Emergency Department (HOSPITAL_COMMUNITY): Payer: PRIVATE HEALTH INSURANCE

## 2015-03-03 ENCOUNTER — Encounter (HOSPITAL_COMMUNITY): Payer: Self-pay | Admitting: *Deleted

## 2015-03-03 ENCOUNTER — Emergency Department (HOSPITAL_COMMUNITY)
Admission: EM | Admit: 2015-03-03 | Discharge: 2015-03-04 | Disposition: A | Discharge status: Home / Self Care | Payer: PRIVATE HEALTH INSURANCE | Attending: Emergency Medicine | Admitting: Emergency Medicine

## 2015-03-03 DIAGNOSIS — R062 Wheezing: Secondary | ICD-10-CM

## 2015-03-03 DIAGNOSIS — I259 Chronic ischemic heart disease, unspecified: Secondary | ICD-10-CM | POA: Diagnosis not present

## 2015-03-03 DIAGNOSIS — K219 Gastro-esophageal reflux disease without esophagitis: Secondary | ICD-10-CM | POA: Insufficient documentation

## 2015-03-03 DIAGNOSIS — I252 Old myocardial infarction: Secondary | ICD-10-CM | POA: Insufficient documentation

## 2015-03-03 DIAGNOSIS — Z872 Personal history of diseases of the skin and subcutaneous tissue: Secondary | ICD-10-CM | POA: Diagnosis not present

## 2015-03-03 DIAGNOSIS — Z7982 Long term (current) use of aspirin: Secondary | ICD-10-CM | POA: Diagnosis not present

## 2015-03-03 DIAGNOSIS — Z87891 Personal history of nicotine dependence: Secondary | ICD-10-CM | POA: Insufficient documentation

## 2015-03-03 DIAGNOSIS — E039 Hypothyroidism, unspecified: Secondary | ICD-10-CM | POA: Diagnosis not present

## 2015-03-03 DIAGNOSIS — J069 Acute upper respiratory infection, unspecified: Secondary | ICD-10-CM | POA: Diagnosis not present

## 2015-03-03 DIAGNOSIS — R05 Cough: Secondary | ICD-10-CM

## 2015-03-03 DIAGNOSIS — J159 Unspecified bacterial pneumonia: Secondary | ICD-10-CM | POA: Diagnosis not present

## 2015-03-03 DIAGNOSIS — J189 Pneumonia, unspecified organism: Secondary | ICD-10-CM | POA: Diagnosis not present

## 2015-03-03 DIAGNOSIS — R0602 Shortness of breath: Secondary | ICD-10-CM

## 2015-03-03 DIAGNOSIS — Z792 Long term (current) use of antibiotics: Secondary | ICD-10-CM | POA: Diagnosis not present

## 2015-03-03 DIAGNOSIS — Z79899 Other long term (current) drug therapy: Secondary | ICD-10-CM | POA: Diagnosis not present

## 2015-03-03 DIAGNOSIS — R059 Cough, unspecified: Secondary | ICD-10-CM

## 2015-03-03 HISTORY — DX: Acute myocardial infarction, unspecified: I21.9

## 2015-03-03 MED ORDER — ALBUTEROL SULFATE (2.5 MG/3ML) 0.083% IN NEBU
5.0000 mg | INHALATION_SOLUTION | Freq: Once | RESPIRATORY_TRACT | Status: AC
  Administered 2015-03-03: 5 mg via RESPIRATORY_TRACT
  Filled 2015-03-03: qty 6

## 2015-03-03 MED ORDER — IPRATROPIUM BROMIDE 0.02 % IN SOLN
0.5000 mg | Freq: Once | RESPIRATORY_TRACT | Status: AC
  Administered 2015-03-03: 0.5 mg via RESPIRATORY_TRACT
  Filled 2015-03-03: qty 2.5

## 2015-03-03 MED ORDER — ALBUTEROL SULFATE HFA 108 (90 BASE) MCG/ACT IN AERS: 1.0000 | INHALATION_SPRAY | RESPIRATORY_TRACT | Status: DC | PRN

## 2015-03-03 MED ORDER — AMOXICILLIN-POT CLAVULANATE ER 1000-62.5 MG PO TB12: 2.0000 | ORAL_TABLET | Freq: Two times a day (BID) | ORAL | Status: DC

## 2015-03-03 MED ORDER — PREDNISONE 20 MG PO TABS: ORAL_TABLET | ORAL | Status: DC

## 2015-03-03 NOTE — ED Provider Notes (Signed)
CSN: [id number]     Arrival date & time 03/03/15  2122 History   First MD Initiated Contact with Patient 03/03/15 2137     Chief Complaint  Patient presents with  . Shortness of Breath     (Consider location/radiation/quality/duration/timing/severity/associated sxs/prior Treatment) HPI Comments: [name] is a 59 y.o. female with a PMHx of periph vasc disease, GERD, mitral and aortic valve insufficiency, ITP, CAD and MI s/p stent placement, lupus, and hypothyroidism, who presents to the ED with complaints of one week of URI symptoms including clear rhinorrhea, chest congestion, dry cough, wheezing, shortness of breath/DOE, fever with Tmax 101.6 yesterday, and chills. She was seen by her regular doctor yesterday, given IM Depo-Medrol and started on a Z-Pak, was not given any inhalers, and was told to follow-up if anything worsened. Her symptoms have not improved after these interventions, as well as taking Benadryl. Her symptoms seem to worsen with activity. She has been around multiple sick people recently. 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   She denies any sore throat, ear pain or drainage, eye pain or discharge, chest pain, leg swelling, recent travel/surgery/immobilization, estrogen use, history of DVT/PE, abdominal pain, nausea, vomiting, diarrhea, constipation, dysuria, hematuria, numbness, tingling, weakness, myalgias, arthralgias, rashes, diaphoresis, lightheadedness, claudication, or orthopnea. She is a nonsmoker. Positive family history of MI in "everyone in her mother side" as well as herself. Her cardiologist is Dr. Angelena Form. No PMHx of asthma/COPD. She has had recent echo and stress tests in the last one year per chart review, which were unremarkable.  Patient is a 59 y.o. female presenting with shortness of breath. The history is provided by the patient and medical records. No language interpreter was used.  Shortness of Breath Severity:  Mild Onset  quality:  Gradual Duration:  1 week Timing:  Constant Progression:  Worsening Chronicity:  New Context: URI   Relieved by:  Nothing Worsened by:  Activity Ineffective treatments: benadryl, IM depomedrol, and zpak yesterday. Associated symptoms: cough, fever (Tmax 101.6 yesterday) and wheezing   Associated symptoms: no abdominal pain, no chest pain, no claudication, no diaphoresis, no ear pain, no hemoptysis, no sore throat, no sputum production and no vomiting   Risk factors: no family hx of DVT, no hx of PE/DVT, no oral contraceptive use, no prolonged immobilization, no recent surgery and no tobacco use     Past Medical History  Diagnosis Date  . Peripheral vascular disease, unspecified ([place])   . Esophageal reflux   . Mitral valve insufficiency and aortic valve insufficiency   . Immune thrombocytopenic purpura ([place])   . Unspecified disease of pericardium   . Chronic ischemic heart disease, unspecified     w/ stents in mid circumflex and mid RCA in 03, and balloon angioplasty of Diagonal branch 10-03, last cath Sept 08- 2 vessel disease w/ patent stents  . Lupus erythematosus   . Hypothyroidism   . Myocardial infarction ([place])     x 2   Past Surgical History  Procedure Laterality Date  . Plastic surgical repair       dog bite to leg  . Splenectomy  5-04  . Cholecystectomy, laparoscopic  2010   Family History  Problem Relation Age of Onset  . Cancer Mother     breast and cervical  . Paget's disease of bone Mother   . Fibromyalgia Mother   . Cancer Paternal Grandfather     colon  . Heart attack Other   .  Coronary artery disease Other   . Diabetes Other   . Hypertension Other   . Hyperlipidemia Other   . Hypertension Father   . Heart disease Father     PVD  . GER disease Father    Social History  Substance Use Topics  . Smoking status: Former Smoker -- 0.50 packs/day for 30 years    Types: Cigarettes    Quit date: 12/23/2011  . Smokeless tobacco: Never Used  .  Alcohol Use: No   OB History    No data available     Review of Systems  Constitutional: Positive for fever (Tmax 101.6 yesterday) and chills. Negative for diaphoresis.  HENT: Positive for congestion (chest) and rhinorrhea. Negative for ear discharge, ear pain, sore throat and trouble swallowing.   Eyes: Negative for pain and discharge.  Respiratory: Positive for cough, shortness of breath and wheezing. Negative for hemoptysis and sputum production.   Cardiovascular: Negative for chest pain, claudication and leg swelling.  Gastrointestinal: Negative for nausea, vomiting, abdominal pain, diarrhea and constipation.  Genitourinary: Negative for dysuria and hematuria.  Musculoskeletal: Negative for myalgias and arthralgias.  Skin: Negative for color change.  Allergic/Immunologic: Positive for immunocompromised state (on plaquenil).  Neurological: Negative for weakness, light-headedness and numbness.  Psychiatric/Behavioral: Negative for confusion.   10 Systems reviewed and are negative for acute change except as noted in the HPI.    Allergies  Review of patient's allergies indicates no known allergies.  Home Medications   Prior to Admission medications   Medication Sig Start Date End Date Taking? Authorizing Provider  amoxicillin-clavulanate (AUGMENTIN) 875-125 MG per tablet Take 1 tablet by mouth 2 (two) times daily. Patient not taking: Reported on 03/02/2015 08/23/14   Cassandria Anger, MD  aspirin 81 MG chewable tablet Chew 81 mg by mouth every morning.     Historical Provider, MD  atenolol (TENORMIN) 25 MG tablet TAKE 1 TABLET BY MOUTH EVERY DAY. 01/29/15   Burnell Blanks, MD  azaTHIOprine (IMURAN) 50 MG tablet Take 50 mg by mouth 2 (two) times daily.    Historical Provider, MD  azithromycin (ZITHROMAX) 250 MG tablet Day 1 take 2 pills, days 2-5 take 1 pill daily. 03/02/15   Hoyt Koch, MD  furosemide (LASIX) 40 MG tablet Take 40 mg by mouth as needed for fluid.   07/08/12   Neena Rhymes, MD  hydroxychloroquine (PLAQUENIL) 200 MG tablet Take 200 mg by mouth daily.  09/19/12   Historical Provider, MD  levothyroxine (SYNTHROID, LEVOTHROID) 50 MCG tablet TAKE 1 TABLET EVERY DAY 06/07/14   Hoyt Koch, MD  losartan (COZAAR) 25 MG tablet Take 1 tablet (25 mg total) by mouth daily. 08/31/14   Burnell Blanks, MD  nitroGLYCERIN (NITROSTAT) 0.4 MG SL tablet Place 0.4 mg under the tongue every 5 (five) minutes x 3 doses as needed. For chest pain    Historical Provider, MD  pantoprazole (PROTONIX) 40 MG tablet TAKE 1 TABLET BY MOUTH EVERY DAY. 02/07/15   Burnell Blanks, MD  promethazine-codeine (PHENERGAN WITH CODEINE) 6.25-10 MG/5ML syrup Take 5 mLs by mouth every 4 (four) hours as needed. Patient not taking: Reported on 03/02/2015 08/23/14   Lew Dawes V, MD  rosuvastatin (CRESTOR) 5 MG tablet Take 1 tablet (5 mg total) by mouth daily. Or as directed 09/27/14   Burnell Blanks, MD   BP 135/104 mmHg  Pulse 67  Temp(Src) 98.1 F (36.7 C) (Oral)  Resp 19  SpO2 95% Physical Exam  Constitutional: She is oriented to person, place, and time. Vital signs are normal. She appears well-developed and well-nourished.  Non-toxic appearance. No distress.  Afebrile, nontoxic, NAD  HENT:  Head: Normocephalic and atraumatic.  Nose: Mucosal edema and rhinorrhea present.  Mouth/Throat: Uvula is midline, oropharynx is clear and moist and mucous membranes are normal. No trismus in the jaw. No uvula swelling.  Nose with mild mucosal edema and clear rhinorrhea. Oropharynx clear and moist, without uvular swelling or deviation, no trismus or drooling, no tonsillar swelling or erythema, no exudates.    Eyes: Conjunctivae and EOM are normal. Right eye exhibits no discharge. Left eye exhibits no discharge.  Neck: Normal range of motion. Neck supple.  Cardiovascular: Normal rate, regular rhythm, normal heart sounds and intact distal pulses.  Exam reveals  no gallop and no friction rub.   No murmur heard. RRR, nl s1/s2, no m/r/g, distal pulses intact, no pedal edema   Pulmonary/Chest: Effort normal. No respiratory distress. She has no decreased breath sounds. She has wheezes. She has rhonchi. She has no rales.  Diffuse expiratory wheezing throughout with some scattered rhonchi noted, no hypoxia or increased WOB, speaking in full sentences, SpO2 95% on RA   Abdominal: Soft. Normal appearance and bowel sounds are normal. She exhibits no distension. There is no tenderness. There is no rigidity, no rebound and no guarding.  Musculoskeletal: Normal range of motion.  MAE x4 Strength and sensation grossly intact Distal pulses intact No pedal edema, neg homan's bilaterally  Gait steady  Neurological: She is alert and oriented to person, place, and time. She has normal strength. No sensory deficit.  Skin: Skin is warm, dry and intact. No rash noted.  Psychiatric: She has a normal mood and affect.  Nursing note and vitals reviewed.   ED Course  Procedures (including critical care time) Labs Review Labs Reviewed - No data to display  Imaging Review Dg Chest 2 View  03/03/2015  CLINICAL DATA:  Acute onset of cough, shortness of breath and wheezing. Initial encounter. EXAM: CHEST  2 VIEW COMPARISON:  Chest radiograph performed 12/16/2013 FINDINGS: The lungs are well-aerated. Vague right midlung airspace opacities raise concern for pneumonia. There is no evidence of pleural effusion or pneumothorax. The heart is normal in size; the mediastinal contour is within normal limits. No acute osseous abnormalities are seen. Clips are noted within the right upper quadrant, reflecting prior cholecystectomy. IMPRESSION: Vague right mid lung airspace opacities raise concern for pneumonia. Followup PA and lateral chest X-ray is recommended in 3-4 weeks following trial of antibiotic therapy to ensure resolution and exclude underlying malignancy. Electronically Signed    By: Garald Balding M.D.   On: 03/03/2015 22:11     Echo 09/18/14: Study Conclusions - Left ventricle: The cavity size was normal. Wall thickness was normal. Systolic function was mildly reduced. The estimated ejection fraction was in the range of 45% to 50%. Akinesis and scarring of the basal-midinferolateral myocardium. Doppler parameters are consistent with abnormal left ventricular relaxation (grade 1 diastolic dysfunction). - Aortic valve: There was mild regurgitation. - Mitral valve: There was moderate regurgitation directed eccentrically and posteriorly. - Left atrium: The atrium was mildly dilated. - Atrial septum: No defect or patent foramen ovale was identified.  [facility] stress test 01/11/14: Notes Recorded by Burnell Blanks, MD on 01/11/2014 at 3:33 PM No ischemia on stress test. Unchanged from stress test in 2007   I have personally reviewed and evaluated these images and lab results as part of my  medical decision-making.   EKG Interpretation   Date/Time:  Saturday March 03 2015 22:16:33 EST Ventricular Rate:  66 PR Interval:  159 QRS Duration: 91 QT Interval:  411 QTC Calculation: 431 R Axis:   20 Text Interpretation:  Sinus rhythm Low voltage, precordial leads No  significant change since last tracing Confirmed by [facility] MD, Bonham  ([id number]) on 03/03/2015 11:13:13 PM      MDM   Final diagnoses:  Cough  Wheezing  CAP (community acquired pneumonia)  URI (upper respiratory infection)  SOB (shortness of breath)    59 y.o. female here with cough, chest congestion, wheezing, and URI symptoms x1wk. Seen yesterday at PCP's office, given IM depomedrol and started on azithromycin. Pt here due to no improvement in symptoms. No inhaler prescribed yesterday. Denies CP, no leg swelling, no tachycardia or hypoxia, doubt PE. On exam, diffuse rhonchi and wheezing throughout, some nasal congestion noted. Will obtain CXR, EKG, and give duoneb. Already  given steroids yesterday which would last 48-72hrs, no need to repeat that today. Could potentially benefit from slightly prolonged steroid dosing, perhaps at discharge could go home with 3-4 days additional. Likely would need inhaler going home. Doubt need for labs today. Will reassess shortly.   11:14 PM Lung sounds slightly improved although still having some wheezing. Pt feels slightly better. Will repeat nebs and recheck. EKG unremarkable. CXR showing vague RML opacity concerning for PNA. Given that she's immunosuppressed, will opt for more aggressive treatment using augmentin XR 2g BID x5 days in addition to her azithromycin. Will also likely give prednisone (to start tomorrow) for 4 days to help with wheezing. Will repeat nebs now and reassess shortly.   12:05 AM Lung sounds greatly improved after second nebs, no ongoing wheezing, some rhonchi still but this is improving. Discussed mucinex and antihistamine use in addition to previously discussed treatments including prednisone, additional augmentin on top of azithromycin, and inhaler. F/up with PCP in 5-7 days for recheck. I explained the diagnosis and have given explicit precautions to return to the ER including for any other new or worsening symptoms. The patient understands and accepts the medical plan as it's been dictated and I have answered their questions. Discharge instructions concerning home care and prescriptions have been given. The patient is STABLE and is discharged to home in good condition.   BP 99/63 mmHg  Pulse 71  Temp(Src) 98.1 F (36.7 C) (Oral)  Resp 17  SpO2 100%  Meds ordered this encounter  Medications  . albuterol (PROVENTIL) (2.5 MG/3ML) 0.083% nebulizer solution 5 mg    Sig:   . ipratropium (ATROVENT) nebulizer solution 0.5 mg    Sig:   . predniSONE (DELTASONE) 20 MG tablet    Sig: 3 tabs po daily x 4 days starting on 03/04/15    Dispense:  12 tablet    Refill:  0    Order Specific Question:  Supervising  Provider    Answer:  Noemi Chapel [[id number]]  . albuterol (PROVENTIL HFA;VENTOLIN HFA) 108 (90 BASE) MCG/ACT inhaler    Sig: Inhale 1-2 puffs into the lungs every 4 (four) hours as needed for wheezing or shortness of breath (cough).    Dispense:  1 Inhaler    Refill:  0    Order Specific Question:  Supervising Provider    Answer:  MILLER, BRIAN [[id number]]  . amoxicillin-clavulanate (AUGMENTIN XR) 1000-62.5 MG 12 hr tablet    Sig: Take 2 tablets by mouth 2 (two) times daily. X 5 days  Dispense:  20 tablet    Refill:  0    Order Specific Question:  Supervising Provider    Answer:  MILLER, BRIAN [[id number]]  . albuterol (PROVENTIL) (2.5 MG/3ML) 0.083% nebulizer solution 5 mg    Sig:   . ipratropium (ATROVENT) nebulizer solution 0.5 mg    Sig:      Javari Bufkin Camprubi-Soms, PA-C 03/04/15 0006  Gareth Morgan, MD 03/05/15 1302

## 2015-03-03 NOTE — Discharge Instructions (Signed)
Continue to stay well-hydrated. Get plenty of rest. Continue to alternate between Tylenol and Ibuprofen for pain or fever. Use Mucinex for cough suppression/expectoration of mucus. Use netipot and flonase to help with nasal congestion. May consider over-the-counter Benadryl or other antihistamine to decrease secretions and for watery itchy eyes. Take prednisone as directed to help with cough/wheezing, to be started tomorrow. Use inhaler as directed, as needed for cough/chest congestion. Continue your Azithromycin given to you by your regular doctor, and start taking augmentin antibiotic in addition as directed, and use both until completed. Followup with your primary care doctor in 3-5 days for recheck of ongoing symptoms. Return to emergency department for emergent changing or worsening of symptoms.   Community-Acquired Pneumonia, Adult Pneumonia is an infection of the lungs. There are different types of pneumonia. One type can develop while a person is in a hospital. A different type, called community-acquired pneumonia, develops in people who are not, or have not recently been, in the hospital or other health care facility.  CAUSES Pneumonia may be caused by bacteria, viruses, or funguses. Community-acquired pneumonia is often caused by Streptococcus pneumonia bacteria. These bacteria are often passed from one person to another by breathing in droplets from the cough or sneeze of an infected person. RISK FACTORS The condition is more likely to develop in:  People who havechronic diseases, such as chronic obstructive pulmonary disease (COPD), asthma, congestive heart failure, cystic fibrosis, diabetes, or kidney disease.  People who haveearly-stage or late-stage HIV.  People who havesickle cell disease.  People who havehad their spleen removed (splenectomy).  People who havepoor Human resources officer.  People who havemedical conditions that increase the risk of breathing in (aspirating)  secretions their own mouth and nose.   People who havea weakened immune system (immunocompromised).  People who smoke.  People whotravel to areas where pneumonia-causing germs commonly exist.  People whoare around animal habitats or animals that have pneumonia-causing germs, including birds, bats, rabbits, cats, and farm animals. SYMPTOMS Symptoms of this condition include:  Adry cough.  A wet (productive) cough.  Fever.  Sweating.  Chest pain, especially when breathing deeply or coughing.  Rapid breathing or difficulty breathing.  Shortness of breath.  Shaking chills.  Fatigue.  Muscle aches. DIAGNOSIS Your health care provider will take a medical history and perform a physical exam. You may also have other tests, including:  Imaging studies of your chest, including X-rays.  Tests to check your blood oxygen level and other blood gases.  Other tests on blood, mucus (sputum), fluid around your lungs (pleural fluid), and urine. If your pneumonia is severe, other tests may be done to identify the specific cause of your illness. TREATMENT The type of treatment that you receive depends on many factors, such as the cause of your pneumonia, the medicines you take, and other medical conditions that you have. For most adults, treatment and recovery from pneumonia may occur at home. In some cases, treatment must happen in a hospital. Treatment may include:  Antibiotic medicines, if the pneumonia was caused by bacteria.  Antiviral medicines, if the pneumonia was caused by a virus.  Medicines that are given by mouth or through an IV tube.  Oxygen.  Respiratory therapy. Although rare, treating severe pneumonia may include:  Mechanical ventilation. This is done if you are not breathing well on your own and you cannot maintain a safe blood oxygen level.  Thoracentesis. This procedureremoves fluid around one lung or both lungs to help you breathe better. HOME CARE  INSTRUCTIONS  Take over-the-counter and prescription medicines only as told by your health care provider.  Only takecough medicine if you are losing sleep. Understand that cough medicine can prevent your body's natural ability to remove mucus from your lungs.  If you were prescribed an antibiotic medicine, take it as told by your health care provider. Do not stop taking the antibiotic even if you start to feel better.  Sleep in a semi-upright position at night. Try sleeping in a reclining chair, or place a few pillows under your head.  Do not use tobacco products, including cigarettes, chewing tobacco, and e-cigarettes. If you need help quitting, ask your health care provider.  Drink enough water to keep your urine clear or pale yellow. This will help to thin out mucus secretions in your lungs. PREVENTION There are ways that you can decrease your risk of developing community-acquired pneumonia. Consider getting a pneumococcal vaccine if:  You are older than 59 years of age.  You are older than 59 years of age and are undergoing cancer treatment, have chronic lung disease, or have other medical conditions that affect your immune system. Ask your health care provider if this applies to you. There are different types and schedules of pneumococcal vaccines. Ask your health care provider which vaccination option is best for you. You may also prevent community-acquired pneumonia if you take these actions:  Get an influenza vaccine every year. Ask your health care provider which type of influenza vaccine is best for you.  Go to the dentist on a regular basis.  Wash your hands often. Use hand sanitizer if soap and water are not available. SEEK MEDICAL CARE IF:  You have a fever.  You are losing sleep because you cannot control your cough with cough medicine. SEEK IMMEDIATE MEDICAL CARE IF:  You have worsening shortness of breath.  You have increased chest pain.  Your sickness becomes  worse, especially if you are an older adult or have a weakened immune system.  You cough up blood.   This information is not intended to replace advice given to you by your health care provider. Make sure you discuss any questions you have with your health care provider.   Document Released: 03/10/2005 Document Revised: 11/29/2014 Document Reviewed: 07/05/2014 Elsevier Interactive Patient Education 2016 Elsevier Inc[employer]  Cough, Adult A cough helps to clear your throat and lungs. A cough may last only 2-3 weeks (acute), or it may last longer than 8 weeks (chronic). Many different things can cause a cough. A cough may be a sign of an illness or another medical condition. HOME CARE  Pay attention to any changes in your cough.  Take medicines only as told by your doctor.  If you were prescribed an antibiotic medicine, take it as told by your doctor. Do not stop taking it even if you start to feel better.  Talk with your doctor before you try using a cough medicine.  Drink enough fluid to keep your pee (urine) clear or pale yellow.  If the air is dry, use a cold steam vaporizer or humidifier in your home.  Stay away from things that make you cough at work or at home.  If your cough is worse at night, try using extra pillows to raise your head up higher while you sleep.  Do not smoke, and try not to be around smoke. If you need help quitting, ask your doctor.  Do not have caffeine.  Do not drink alcohol.  Rest as needed.  GET HELP IF:  You have new problems (symptoms).  You cough up yellow fluid (pus).  Your cough does not get better after 2-3 weeks, or your cough gets worse.  Medicine does not help your cough and you are not sleeping well.  You have pain that gets worse or pain that is not helped with medicine.  You have a fever.  You are losing weight and you do not know why.  You have night sweats. GET HELP RIGHT AWAY IF:  You cough up blood.  You have trouble  breathing.  Your heartbeat is very fast.   This information is not intended to replace advice given to you by your health care provider. Make sure you discuss any questions you have with your health care provider.   Document Released: 11/21/2010 Document Revised: 11/29/2014 Document Reviewed: 05/17/2014 Elsevier Interactive Patient Education 2016 [employer].  How to Use an Inhaler Using your inhaler correctly is very important. Good technique will make sure that the medicine reaches your lungs.  HOW TO USE AN INHALER:  Take the cap off the inhaler.  If this is the first time using your inhaler, you need to prime it. Shake the inhaler for 5 seconds. Release four puffs into the air, away from your face. Ask your doctor for help if you have questions.  Shake the inhaler for 5 seconds.  Turn the inhaler so the bottle is above the mouthpiece.  Put your pointer finger on top of the bottle. Your thumb holds the bottom of the inhaler.  Open your mouth.  Either hold the inhaler away from your mouth (the width of 2 fingers) or place your lips tightly around the mouthpiece. Ask your doctor which way to use your inhaler.  Breathe out as much air as possible.  Breathe in and push down on the bottle 1 time to release the medicine. You will feel the medicine go in your mouth and throat.  Continue to take a deep breath in very slowly. Try to fill your lungs.  After you have breathed in completely, hold your breath for 10 seconds. This will help the medicine to settle in your lungs. If you cannot hold your breath for 10 seconds, hold it for as long as you can before you breathe out.  Breathe out slowly, through pursed lips. Whistling is an example of pursed lips.  If your doctor has told you to take more than 1 puff, wait at least 15-30 seconds between puffs. This will help you get the best results from your medicine. Do not use the inhaler more than your doctor tells you to.  Put the cap  back on the inhaler.  Follow the directions from your doctor or from the inhaler package about cleaning the inhaler. If you use more than one inhaler, ask your doctor which inhalers to use and what order to use them in. Ask your doctor to help you figure out when you will need to refill your inhaler.  If you use a steroid inhaler, always rinse your mouth with water after your last puff, gargle and spit out the water. Do not swallow the water. GET HELP IF:  The inhaler medicine only partially helps to stop wheezing or shortness of breath.  You are having trouble using your inhaler.  You have some increase in thick spit (phlegm). GET HELP RIGHT AWAY IF:  The inhaler medicine does not help your wheezing or shortness of breath or you have tightness in your chest.  You  have dizziness, headaches, or fast heart rate.  You have chills, fever, or night sweats.  You have a large increase of thick spit, or your thick spit is bloody. MAKE SURE YOU:   Understand these instructions.  Will watch your condition.  Will get help right away if you are not doing well or get worse.   This information is not intended to replace advice given to you by your health care provider. Make sure you discuss any questions you have with your health care provider.   Document Released: 12/18/2007 Document Revised: 12/29/2012 Document Reviewed: 10/07/2012 Elsevier Interactive Patient Education 2016 [place] of Breath Shortness of breath means you have trouble breathing. Shortness of breath needs medical care right away. HOME CARE   Do not smoke.  Avoid being around chemicals or things (paint fumes, dust) that may bother your breathing.  Rest as needed. Slowly begin your normal activities.  Only take medicines as told by your doctor.  Keep all doctor visits as told. GET HELP RIGHT AWAY IF:   Your shortness of breath gets worse.  You feel lightheaded, pass out (faint), or have a cough  that is not helped by medicine.  You cough up blood.  You have pain with breathing.  You have pain in your chest, arms, shoulders, or belly (abdomen).  You have a fever.  You cannot walk up stairs or exercise the way you normally do.  You do not get better in the time expected.  You have a hard time doing normal activities even with rest.  You have problems with your medicines.  You have any new symptoms. MAKE SURE YOU:  Understand these instructions.  Will watch your condition.  Will get help right away if you are not doing well or get worse.   This information is not intended to replace advice given to you by your health care provider. Make sure you discuss any questions you have with your health care provider.   Document Released: 08/27/2007 Document Revised: 03/15/2013 Document Reviewed: 05/26/2011 Elsevier Interactive Patient Education 2016 Elsevier Inc.  Upper Respiratory Infection, Adult Most upper respiratory infections (URIs) are caused by a virus. A URI affects the nose, throat, and upper air passages. The most common type of URI is often called "the common cold." HOME CARE   Take medicines only as told by your doctor.  Gargle warm saltwater or take cough drops to comfort your throat as told by your doctor.  Use a warm mist humidifier or inhale steam from a shower to increase air moisture. This may make it easier to breathe.  Drink enough fluid to keep your pee (urine) clear or pale yellow.  Eat soups and other clear broths.  Have a healthy diet.  Rest as needed.  Go back to work when your fever is gone or your doctor says it is okay.  You may need to stay home longer to avoid giving your URI to others.  You can also wear a face mask and wash your hands often to prevent spread of the virus.  Use your inhaler more if you have asthma.  Do not use any tobacco products, including cigarettes, chewing tobacco, or electronic cigarettes. If you need help  quitting, ask your doctor. GET HELP IF:  You are getting worse, not better.  Your symptoms are not helped by medicine.  You have chills.  You are getting more short of breath.  You have brown or red mucus.  You have yellow or  brown discharge from your nose.  You have pain in your face, especially when you bend forward.  You have a fever.  You have puffy (swollen) neck glands.  You have pain while swallowing.  You have white areas in the back of your throat. GET HELP RIGHT AWAY IF:   You have very bad or constant:  Headache.  Ear pain.  Pain in your forehead, behind your eyes, and over your cheekbones (sinus pain).  Chest pain.  You have long-lasting (chronic) lung disease and any of the following:  Wheezing.  Long-lasting cough.  Coughing up blood.  A change in your usual mucus.  You have a stiff neck.  You have changes in your:  Vision.  Hearing.  Thinking.  Mood. MAKE SURE YOU:   Understand these instructions.  Will watch your condition.  Will get help right away if you are not doing well or get worse.   This information is not intended to replace advice given to you by your health care provider. Make sure you discuss any questions you have with your health care provider.   Document Released: 08/27/2007 Document Revised: 07/25/2014 Document Reviewed: 06/15/2013 Elsevier Interactive Patient Education [employer].

## 2015-03-03 NOTE — ED Notes (Signed)
Pt states that she has had cough and congestion over the last week or so; pt states that it has been getting progressively worse over the last few days; pt c/o dry cough; congestion; and feeling short of breath, worse with exertion; pt states that she can feel the congestion in her chest but cannot get anything up

## 2015-03-05 ENCOUNTER — Ambulatory Visit: Payer: Medicare Other | Admitting: Internal Medicine

## 2015-03-06 ENCOUNTER — Ambulatory Visit: Payer: Medicare Other | Admitting: Internal Medicine

## 2015-03-12 ENCOUNTER — Encounter: Payer: Self-pay | Admitting: Family

## 2015-03-12 ENCOUNTER — Ambulatory Visit (INDEPENDENT_AMBULATORY_CARE_PROVIDER_SITE_OTHER): Payer: PRIVATE HEALTH INSURANCE | Admitting: Family

## 2015-03-12 DIAGNOSIS — J189 Pneumonia, unspecified organism: Secondary | ICD-10-CM

## 2015-03-12 DIAGNOSIS — I2510 Atherosclerotic heart disease of native coronary artery without angina pectoris: Secondary | ICD-10-CM | POA: Diagnosis not present

## 2015-03-12 MED ORDER — FLUTICASONE FUROATE-VILANTEROL 100-25 MCG/INH IN AEPB: 1.0000 | INHALATION_SPRAY | Freq: Every day | RESPIRATORY_TRACT | Status: DC | PRN

## 2015-03-12 NOTE — Progress Notes (Signed)
Subjective:    Patient ID: [name], female    DOB: [birth year], 59 y.o.   MRN: [id number]  Chief Complaint  Patient presents with  . Hospitalization Follow-up    states she was diagnosed with pneumonia and does not feel fully better    HPI:  [name] is a 59 y.o. female who  has a past medical history of Peripheral vascular disease, unspecified ([place]); Esophageal reflux; Mitral valve insufficiency and aortic valve insufficiency; Immune thrombocytopenic purpura ([place]); Unspecified disease of pericardium; Chronic ischemic heart disease, unspecified; Lupus erythematosus; Hypothyroidism; and Myocardial infarction ([place]). and presents today for a follow up office visit.   Recently evaluated in the emergency department for upper respiratory symptoms including rhinorrhea, chest congestion, shortness of breath, dry cough, and wheezing. She also had a temperature maximum of 101.6. Chest x-ray was concerning for pneumonia. She was treated with high-dose Augmentin, prednisone and albuterol.  Since leaving the emergency department she has completed the Augmentin, prednisone, and albuterol. Reports that she is 95% better from where she was. She was wheezing on Friday which has improved. She discussed with her current PCP about possibly getting a pneumonia booster.  No Known Allergies   Current Outpatient Prescriptions on File Prior to Visit  Medication Sig Dispense Refill  . aspirin 81 MG chewable tablet Chew 81 mg by mouth every morning.     Marland Kitchen atenolol (TENORMIN) 25 MG tablet TAKE 1 TABLET BY MOUTH EVERY DAY. 90 tablet 1  . azaTHIOprine (IMURAN) 50 MG tablet Take 50 mg by mouth 2 (two) times daily.    . furosemide (LASIX) 40 MG tablet Take 40 mg by mouth daily as needed for fluid.     . hydroxychloroquine (PLAQUENIL) 200 MG tablet Take 200 mg by mouth daily.     Marland Kitchen levothyroxine (SYNTHROID, LEVOTHROID) 50 MCG tablet TAKE 1 TABLET EVERY DAY 90 tablet 3  . losartan (COZAAR) 25 MG tablet  Take 1 tablet (25 mg total) by mouth daily. 90 tablet 3  . nitroGLYCERIN (NITROSTAT) 0.4 MG SL tablet Place 0.4 mg under the tongue every 5 (five) minutes x 3 doses as needed. For chest pain    . pantoprazole (PROTONIX) 40 MG tablet TAKE 1 TABLET BY MOUTH EVERY DAY. 90 tablet 1  . rosuvastatin (CRESTOR) 5 MG tablet Take 1 tablet (5 mg total) by mouth daily. Or as directed (Patient taking differently: Take 5 mg by mouth every Monday, Wednesday, and Friday. Or as directed) 30 tablet 1   No current facility-administered medications on file prior to visit.    Past Medical History  Diagnosis Date  . Peripheral vascular disease, unspecified ([place])   . Esophageal reflux   . Mitral valve insufficiency and aortic valve insufficiency   . Immune thrombocytopenic purpura ([place])   . Unspecified disease of pericardium   . Chronic ischemic heart disease, unspecified     w/ stents in mid circumflex and mid RCA in 03, and balloon angioplasty of Diagonal branch 10-03, last cath Sept 08- 2 vessel disease w/ patent stents  . Lupus erythematosus   . Hypothyroidism   . Myocardial infarction ([place])     x 2    Past Surgical History  Procedure Laterality Date  . Plastic surgical repair       dog bite to leg  . Splenectomy  5-04  . Cholecystectomy, laparoscopic  2010    Review of Systems  Constitutional: Negative for fever and chills.  HENT: Negative for congestion, sinus pressure and  sore throat.   Respiratory: Negative for chest tightness, shortness of breath and wheezing.   Cardiovascular: Negative for chest pain.      Objective:    BP 112/76 mmHg  Pulse 69  Temp(Src) 98.2 F (36.8 C) (Oral)  Resp 18  Ht 5\' 7"  (1.702 m)  Wt 245 lb 12.8 oz (111.494 kg)  BMI 38.49 kg/m2  SpO2 98% Nursing note and vital signs reviewed.  Physical Exam  Constitutional: She is oriented to person, place, and time. She appears well-developed and well-nourished. No distress.  HENT:  Right Ear: Hearing, tympanic  membrane, external ear and ear canal normal.  Left Ear: Hearing, tympanic membrane, external ear and ear canal normal.  Nose: Nose normal.  Mouth/Throat: Uvula is midline, oropharynx is clear and moist and mucous membranes are normal.  Cardiovascular: Normal rate, regular rhythm, normal heart sounds and intact distal pulses.   Pulmonary/Chest: Effort normal and breath sounds normal.  Neurological: She is alert and oriented to person, place, and time.  Skin: Skin is warm and dry.  Psychiatric: She has a normal mood and affect. Her behavior is normal. Judgment and thought content normal.       Assessment & Plan:   Problem List Items Addressed This Visit      Respiratory   CAP (community acquired pneumonia)    Recently diagnosed with pneumonia during emergency room visit which is significantly improved following antibiotic and prednisone therapy. Does continue to experience occasional wheezing. Start Breo as needed for cough and addition to albuterol. Follow-up with PCP regarding additional pneumococcal vaccinations secondary to compromised immune system. Follow-up if symptoms worsen or fail to improve.      Relevant Medications   Fluticasone Furoate-Vilanterol (BREO ELLIPTA) 100-25 MCG/INH AEPB

## 2015-03-12 NOTE — Patient Instructions (Signed)
Thank you for choosing [facility].  Summary/Instructions:  Your prescription(s) have been submitted to your pharmacy or been printed and provided for you. Please take as directed and contact our office if you believe you are having problem(s) with the medication(s) or have any questions.  If your symptoms worsen or fail to improve, please contact our office for further instruction, or in case of emergency go directly to the emergency room at the closest medical facility.     

## 2015-03-12 NOTE — Progress Notes (Signed)
Pre visit review using our clinic review tool, if applicable. No additional management support is needed unless otherwise documented below in the visit note. 

## 2015-03-12 NOTE — Assessment & Plan Note (Signed)
Recently diagnosed with pneumonia during emergency room visit which is significantly improved following antibiotic and prednisone therapy. Does continue to experience occasional wheezing. Start Breo as needed for cough and addition to albuterol. Follow-up with PCP regarding additional pneumococcal vaccinations secondary to compromised immune system. Follow-up if symptoms worsen or fail to improve.

## 2015-05-04 ENCOUNTER — Other Ambulatory Visit: Payer: Self-pay | Admitting: Cardiovascular Disease

## 2015-05-07 ENCOUNTER — Ambulatory Visit: Payer: Medicare Other | Admitting: Cardiovascular Disease

## 2015-05-07 ENCOUNTER — Other Ambulatory Visit: Payer: Self-pay | Admitting: Cardiovascular Disease

## 2015-05-08 NOTE — Progress Notes (Signed)
Cardiology Office Note:    Date:  05/09/2015   ID:  [name], DOB 02-02-1956, MRN [id number]  PCP:  Hoyt Koch, MD  Cardiologist:  Dr. Lauree Chandler   Electrophysiologist:  n/a  Chief Complaint  Patient presents with  . Coronary Artery Disease    Follow up    History of Present Illness:     [name] is a 60 y.o. female with a hx of CAD status post BMS to the RCA and LCx in 2003, PTCA to the diagonal in 2003, hypothyroidism, moderate mitral regurgitation, GERD, SLE. She was admitted in 12/13 with volume overload. She was followed by nephrology and her acute illness was felt to be due to lupus flare. Last seen by Dr. Angelena Form 6/16. She has declined statin therapy in the past. Follow-up echo demonstrated EF 45-50% with inferolateral akinesis, mild diastolic dysfunction, mild aortic insufficiency and moderate mitral regurgitation. She was referred to the lipid clinic and placed on low dose Crestor 3 times a week.      Past Medical History  Diagnosis Date  . Peripheral vascular disease, unspecified ([place])   . Esophageal reflux   . Mitral valve insufficiency and aortic valve insufficiency   . Immune thrombocytopenic purpura ([place])   . Unspecified disease of pericardium   . Chronic ischemic heart disease, unspecified     w/ stents in mid circumflex and mid RCA in 03, and balloon angioplasty of Diagonal branch 10-03, last cath Sept 08- 2 vessel disease w/ patent stents  . Lupus erythematosus   . Hypothyroidism   . Myocardial infarction ([place])     x 2    Past Surgical History  Procedure Laterality Date  . Plastic surgical repair       dog bite to leg  . Splenectomy  5-04  . Cholecystectomy, laparoscopic  2010    Current Medications: Outpatient Prescriptions Prior to Visit  Medication Sig Dispense Refill  . aspirin 81 MG chewable tablet Chew 81 mg by mouth every morning.     Marland Kitchen atenolol (TENORMIN) 25 MG tablet TAKE 1 TABLET BY MOUTH EVERY DAY. 90  tablet 1  . azaTHIOprine (IMURAN) 50 MG tablet Take 50 mg by mouth 2 (two) times daily.    . Fluticasone Furoate-Vilanterol (BREO ELLIPTA) 100-25 MCG/INH AEPB Inhale 1 puff into the lungs daily as needed. 28 each 0  . furosemide (LASIX) 40 MG tablet Take 40 mg by mouth daily as needed for fluid.     . hydroxychloroquine (PLAQUENIL) 200 MG tablet Take 200 mg by mouth daily.     Marland Kitchen levothyroxine (SYNTHROID, LEVOTHROID) 50 MCG tablet TAKE 1 TABLET EVERY DAY 90 tablet 3  . losartan (COZAAR) 25 MG tablet Take 1 tablet (25 mg total) by mouth daily. 90 tablet 3  . nitroGLYCERIN (NITROSTAT) 0.4 MG SL tablet Place 0.4 mg under the tongue every 5 (five) minutes x 3 doses as needed. For chest pain    . pantoprazole (PROTONIX) 40 MG tablet TAKE 1 TABLET BY MOUTH EVERY DAY. 90 tablet 1  . rosuvastatin (CRESTOR) 5 MG tablet TAKE 1 TABLET BY MOUTH DAILY OR AS DIRECTED 30 tablet 0   No facility-administered medications prior to visit.     Allergies:   Review of patient's allergies indicates no known allergies.   Social History   Social History  . Marital Status: Single    Spouse Name: N/A  . Number of Children: N/A  . Years of Education: N/A   Social History Main  Topics  . Smoking status: Former Smoker -- 0.50 packs/day for 30 years    Types: Cigarettes    Quit date: 12/23/2011  . Smokeless tobacco: Never Used  . Alcohol Use: No  . Drug Use: No  . Sexual Activity: No   Other Topics Concern  . Not on file   Social History Narrative   HSG,  graduated from [employer] in [place]. In college [name] -grad '13 with relgious studies. [employer] - Fall '13 for MA-divinity. Marrried '73 - 2 years/ divorced. 2 son- '73, '75 - CP.    Occupation: full-time Ship broker. She lives alone with her younger son living with her part-time but he resides in a managed care facility.      Family History:  The patient's family history includes Cancer in her mother and paternal grandfather; Coronary artery  disease in her other; Diabetes in her other; Fibromyalgia in her mother; GER disease in her father; Heart attack in her other; Heart disease in her father; Hyperlipidemia in her other; Hypertension in her father and other; Paget's disease of bone in her mother.   ROS:   Please see the history of present illness.    ROS All other systems reviewed and are negative.   Physical Exam:    VS:  There were no vitals taken for this visit.   GEN: Well nourished, well developed, in no acute distress HEENT: normal Neck: no JVD, no masses Cardiac: Normal S1/S2, RRR; no murmurs, rubs, or gallops, no edema;   carotid bruits,   Respiratory:  clear to auscultation bilaterally; no wheezing, rhonchi or rales GI: soft, nontender, nondistended, + BS MS: no deformity or atrophy Skin: warm and dry, no rash Neuro:  Bilateral strength equal, no focal deficits  Psych: Alert and oriented x 3, normal affect  Wt Readings from Last 3 Encounters:  03/12/15 245 lb 12.8 oz (111.494 kg)  03/02/15 245 lb (111.131 kg)  08/31/14 247 lb 6.4 oz (112.22 kg)      Studies/Labs Reviewed:     EKG:  EKG is  ordered today.  The ekg ordered today demonstrates   Recent Labs: 08/31/2014: BUN 12; Creatinine, Ser 1.01; Potassium 4.6; Sodium 139; TSH 1.06 12/28/2014: ALT 14   Recent Lipid Panel    Component Value Date/Time   CHOL 170 12/28/2014 0931   TRIG 60 12/28/2014 0931   HDL 50 12/28/2014 0931   CHOLHDL 3.4 12/28/2014 0931   VLDL 12 12/28/2014 0931   LDLCALC 108 12/28/2014 0931   LDLDIRECT 321.8 02/27/2012 1202    Additional studies/ records that were reviewed today include:    Echo 6/16 EF 45-50%, inf-lat AK, Gr 1 DD, mild AI, mod MR, mild LAE  Myoview 10/15 Low risk stress nuclear study. There is a medium size defect of moderate severity involving the basal and mid inferior and inferolateral segments. There is no ischemia.   LV Ejection Fraction: 49%.  LHC 9/08 LM patent LAD ok LCx stent patent RCA  mid stent patent, AM jailed; mPDA 50% EF 50%  ASSESSMENT:     1. Coronary artery disease involving native coronary artery of native heart without angina pectoris   2. Ischemic cardiomyopathy   3. Mitral regurgitation   4. Hyperlipidemia   5. Tobacco abuse     PLAN:     In order of problems listed above:  1. CAD -   2. Ischemic CM - EF 45-50% with inf-lat AK on Echo last year.    3. Mitral Regurgitation -  Moderate by recent echo.    4. HL - Followed by lipid clinic.   5. Tobacco Abuse -    Medication Adjustments/Labs and Tests Ordered: Current medicines are reviewed at length with the patient today.  Concerns regarding medicines are outlined above.  Medication changes, Labs and Tests ordered today are outlined in the Patient Instructions noted below. There are no Patient Instructions on file for this visit. Signed, Richardson Dopp, PA-C  05/09/2015 3:42 PM    [facility] [address], [place], [place]  [address] Phone: [phone]; Fax: [phone]     This encounter was created in error - please disregard.

## 2015-05-09 ENCOUNTER — Encounter: Payer: Medicare Other | Admitting: Physician Assistant

## 2015-05-11 DIAGNOSIS — N181 Chronic kidney disease, stage 1: Secondary | ICD-10-CM | POA: Diagnosis not present

## 2015-05-11 DIAGNOSIS — N189 Chronic kidney disease, unspecified: Secondary | ICD-10-CM | POA: Diagnosis not present

## 2015-05-11 DIAGNOSIS — D631 Anemia in chronic kidney disease: Secondary | ICD-10-CM | POA: Diagnosis not present

## 2015-05-11 DIAGNOSIS — E785 Hyperlipidemia, unspecified: Secondary | ICD-10-CM | POA: Diagnosis not present

## 2015-05-20 NOTE — Progress Notes (Signed)
Cardiology Office Note:    Date:  05/21/2015   ID:  [name], DOB 1955-09-04, MRN [id number]  PCP:  Hoyt Koch, MD  Cardiologist:  Dr. Lauree Chandler   Electrophysiologist:  N/a Rheumatologist:  Dr. Gavin Pound Nephrologist:  Dr. Edrick Oh   Chief Complaint  Patient presents with  . Coronary Artery Disease    Follow up    History of Present Illness:     [name] is a 60 y.o. female with a hx of CAD status post BMS to the RCA and LCx in 2003, PTCA to the diagonal in 2003, hypothyroidism, moderate mitral regurgitation, GERD, SLE. She was admitted in 12/13 with volume overload. She was followed by nephrology and her acute illness was felt to be due to lupus flare. Last seen by Dr. Angelena Form 6/16. She has declined statin therapy in the past. Follow-up echo demonstrated EF 45-50% with inferolateral akinesis, mild diastolic dysfunction, mild aortic insufficiency and moderate mitral regurgitation. She was referred to the lipid clinic and placed on low dose Crestor 3 times a week.   She returns for follow-up. Overall, she is doing well. She remains very active. She denies significant dyspnea. She denies chest discomfort. She denies syncope, dizziness. She denies orthopnea, PND or edema.   Past Medical History  Diagnosis Date  . Peripheral vascular disease, unspecified ([place])   . Esophageal reflux   . Mitral valve insufficiency and aortic valve insufficiency   . Immune thrombocytopenic purpura ([place])   . Unspecified disease of pericardium   . Chronic ischemic heart disease, unspecified     w/ stents in mid circumflex and mid RCA in 03, and balloon angioplasty of Diagonal branch 10-03, last cath Sept 08- 2 vessel disease w/ patent stents  . Lupus erythematosus   . Hypothyroidism   . Myocardial infarction ([place])     x 2    Past Surgical History  Procedure Laterality Date  . Plastic surgical repair       dog bite to leg  . Splenectomy  5-04  .  Cholecystectomy, laparoscopic  2010    Current Medications: Outpatient Prescriptions Prior to Visit  Medication Sig Dispense Refill  . aspirin 81 MG chewable tablet Chew 81 mg by mouth every morning.     Marland Kitchen atenolol (TENORMIN) 25 MG tablet TAKE 1 TABLET BY MOUTH EVERY DAY. 90 tablet 1  . azaTHIOprine (IMURAN) 50 MG tablet Take 50 mg by mouth 2 (two) times daily.    . furosemide (LASIX) 40 MG tablet Take 40 mg by mouth daily as needed for fluid.     . hydroxychloroquine (PLAQUENIL) 200 MG tablet Take 200 mg by mouth daily.     Marland Kitchen levothyroxine (SYNTHROID, LEVOTHROID) 50 MCG tablet TAKE 1 TABLET EVERY DAY 90 tablet 3  . losartan (COZAAR) 25 MG tablet Take 1 tablet (25 mg total) by mouth daily. 90 tablet 3  . nitroGLYCERIN (NITROSTAT) 0.4 MG SL tablet Place 0.4 mg under the tongue every 5 (five) minutes x 3 doses as needed. For chest pain    . pantoprazole (PROTONIX) 40 MG tablet TAKE 1 TABLET BY MOUTH EVERY DAY. 90 tablet 1  . rosuvastatin (CRESTOR) 5 MG tablet TAKE 1 TABLET BY MOUTH DAILY OR AS DIRECTED 30 tablet 0  . Fluticasone Furoate-Vilanterol (BREO ELLIPTA) 100-25 MCG/INH AEPB Inhale 1 puff into the lungs daily as needed. 28 each 0   No facility-administered medications prior to visit.     Allergies:   Review of patient's  allergies indicates no known allergies.   Social History   Social History  . Marital Status: Single    Spouse Name: N/A  . Number of Children: N/A  . Years of Education: N/A   Social History Main Topics  . Smoking status: Former Smoker -- 0.50 packs/day for 30 years    Types: Cigarettes    Quit date: 12/23/2011  . Smokeless tobacco: Never Used  . Alcohol Use: No  . Drug Use: No  . Sexual Activity: No   Other Topics Concern  . None   Social History Narrative   HSG,  graduated from [employer] in [place]. In college [name] -grad '13 with relgious studies. [employer] - Fall '13 for MA-divinity. Marrried '73 - 2 years/ divorced. 2 son- '73, '75  - CP.    Occupation: full-time Ship broker. She lives alone with her younger son living with her part-time but he resides in a managed care facility.      Family History:  The patient's family history includes Cancer in her mother and paternal grandfather; Coronary artery disease in her other; Diabetes in her other; Fibromyalgia in her mother; GER disease in her father; Heart attack in her other; Heart disease in her father; Hyperlipidemia in her other; Hypertension in her father and other; Paget's disease of bone in her mother.   ROS:   Please see the history of present illness.    Review of Systems  Constitution: Positive for malaise/fatigue and weight gain.  Musculoskeletal: Positive for joint pain.   All other systems reviewed and are negative.   Physical Exam:    VS:  BP 130/84 mmHg  Pulse 62  Ht 5\' 7"  (1.702 m)  Wt 247 lb 12.8 oz (112.401 kg)  BMI 38.80 kg/m2   GEN: Well nourished, well developed, in no acute distress HEENT: normal Neck: no JVD, no masses Cardiac: Normal 99991111, RRR; 1/6 systolic murmur LLSB/apex,  no edema Respiratory:  clear to auscultation bilaterally; no wheezing, rhonchi or rales GI: soft, nontender MS: no deformity or atrophy Skin: warm and dry Neuro: no focal deficits  Psych: Alert and oriented x 3, normal affect  Wt Readings from Last 3 Encounters:  05/21/15 247 lb 12.8 oz (112.401 kg)  03/12/15 245 lb 12.8 oz (111.494 kg)  03/02/15 245 lb (111.131 kg)      Studies/Labs Reviewed:     EKG:  EKG is  ordered today.  The ekg ordered today demonstrates NSR, HR 62, normal axis, no ST changes, QTc 460 ms  Recent Labs: 08/31/2014: BUN 12; Creatinine, Ser 1.01; Potassium 4.6; Sodium 139; TSH 1.06 12/28/2014: ALT 14   Recent Lipid Panel    Component Value Date/Time   CHOL 170 12/28/2014 0931   TRIG 60 12/28/2014 0931   HDL 50 12/28/2014 0931   CHOLHDL 3.4 12/28/2014 0931   VLDL 12 12/28/2014 0931   LDLCALC 108 12/28/2014 0931   LDLDIRECT 321.8  02/27/2012 1202    Additional studies/ records that were reviewed today include:   Echo 6/16 EF 45-50%, inf-lat AK, Gr 1 DD, mild AI, mod MR, mild LAE  Myoview 10/15 Low risk stress nuclear study. There is a medium size defect of moderate severity involving the basal and mid inferior and inferolateral segments. There is no ischemia.  LV Ejection Fraction: 49%.  LHC 9/08 LM patent LAD ok LCx stent patent RCA mid stent patent, AM jailed; mPDA 50% EF 50%   ASSESSMENT:     1. Coronary artery disease involving native  coronary artery of native heart without angina pectoris   2. Ischemic cardiomyopathy   3. Mitral regurgitation   4. Hyperlipidemia   5. Tobacco abuse     PLAN:     In order of problems listed above:  1. CAD - She is doing well. She denies angina. Continue aspirin, beta blocker, statin.  2. Ischemic CM - EF 45-50% with inf-lat AK on Echo last year. Continue beta blocker, ARB. No evidence of volume excess.  3. Mitral Regurgitation - Moderate by recent echo. Plan follow-up echo in 1 year.  4. HL - Followed by lipid clinic. She needs repeat lipids and LFTs arranged.  5. Tobacco Abuse - She knows that she needs to quit.   Medication Adjustments/Labs and Tests Ordered: Current medicines are reviewed at length with the patient today.  Concerns regarding medicines are outlined above.  Medication changes, Labs and Tests ordered today are outlined in the Patient Instructions noted below. Patient Instructions  Medication Instructions:  Your physician recommends that you continue on your current medications as directed. Please refer to the Current Medication list given to you today.  Labwork: FASTING LIPID AND LIVER PANEL TO BE DONE IN THE NEAR FUTURE  Testing/Procedures: Your physician has requested that you have an echocardiogram THIS IS TO BE DONE ABOUT 1 WEEK BEFORE APPT WITH DR. Angelena Form. Echocardiography is a painless test that uses sound waves to create  images of your heart. It provides your doctor with information about the size and shape of your heart and how well your heart's chambers and valves are working. This procedure takes approximately one hour. There are no restrictions for this procedure.  Follow-Up: Your physician wants you to follow-up in: [place]; YOU WILL NEED AN ECHO TO BE DONE ABOUT 1 WEEK BEFORE APPT WITH DR. You will receive a reminder letter in the mail two months in advance. If you don't receive a letter, please call our office to schedule the follow-up appointment.  Any Other Special Instructions Will Be Listed Below (If Applicable).  If you need a refill on your cardiac medications before your next appointment, please call your pharmacy.   Signed, Richardson Dopp, PA-C  05/21/2015 2:51 PM    [facility] [address], [place], [place]  [address] Phone: [phone]; Fax: [phone]

## 2015-05-21 ENCOUNTER — Ambulatory Visit (INDEPENDENT_AMBULATORY_CARE_PROVIDER_SITE_OTHER): Payer: PRIVATE HEALTH INSURANCE | Admitting: Physician Assistant

## 2015-05-21 ENCOUNTER — Encounter: Payer: Self-pay | Admitting: Physician Assistant

## 2015-05-21 VITALS — BP 130/84 | HR 62 | Ht 67.0 in | Wt 247.8 lb

## 2015-05-21 DIAGNOSIS — I340 Nonrheumatic mitral (valve) insufficiency: Secondary | ICD-10-CM | POA: Diagnosis not present

## 2015-05-21 DIAGNOSIS — I255 Ischemic cardiomyopathy: Secondary | ICD-10-CM

## 2015-05-21 DIAGNOSIS — I2510 Atherosclerotic heart disease of native coronary artery without angina pectoris: Secondary | ICD-10-CM

## 2015-05-21 DIAGNOSIS — E785 Hyperlipidemia, unspecified: Secondary | ICD-10-CM | POA: Diagnosis not present

## 2015-05-21 DIAGNOSIS — Z720 Tobacco use: Secondary | ICD-10-CM

## 2015-05-21 NOTE — Patient Instructions (Addendum)
Medication Instructions:  Your physician recommends that you continue on your current medications as directed. Please refer to the Current Medication list given to you today.  Labwork: FASTING LIPID AND LIVER PANEL TO BE DONE IN THE NEAR FUTURE  Testing/Procedures: Your physician has requested that you have an echocardiogram THIS IS TO BE DONE ABOUT 1 WEEK BEFORE APPT WITH DR. Angelena Form. Echocardiography is a painless test that uses sound waves to create images of your heart. It provides your doctor with information about the size and shape of your heart and how well your heart's chambers and valves are working. This procedure takes approximately one hour. There are no restrictions for this procedure.  Follow-Up: Your physician wants you to follow-up in: [place]; YOU WILL NEED AN ECHO TO BE DONE ABOUT 1 WEEK BEFORE APPT WITH DR. You will receive a reminder letter in the mail two months in advance. If you don't receive a letter, please call our office to schedule the follow-up appointment.  Any Other Special Instructions Will Be Listed Below (If Applicable).  If you need a refill on your cardiac medications before your next appointment, please call your pharmacy.

## 2015-06-24 ENCOUNTER — Other Ambulatory Visit: Payer: Self-pay | Admitting: Internal Medicine

## 2015-08-03 ENCOUNTER — Other Ambulatory Visit: Payer: Self-pay

## 2015-08-03 ENCOUNTER — Other Ambulatory Visit: Payer: Self-pay | Admitting: Cardiovascular Disease

## 2015-08-03 MED ORDER — ROSUVASTATIN CALCIUM 5 MG PO TABS: ORAL_TABLET | ORAL | Status: DC

## 2015-08-03 MED ORDER — ATENOLOL 25 MG PO TABS: 25.0000 mg | ORAL_TABLET | Freq: Every day | ORAL | Status: DC

## 2015-08-03 MED ORDER — PANTOPRAZOLE SODIUM 40 MG PO TBEC: 40.0000 mg | DELAYED_RELEASE_TABLET | Freq: Every day | ORAL | Status: DC

## 2015-08-09 DIAGNOSIS — M3214 Glomerular disease in systemic lupus erythematosus: Secondary | ICD-10-CM | POA: Diagnosis not present

## 2015-08-09 DIAGNOSIS — M329 Systemic lupus erythematosus, unspecified: Secondary | ICD-10-CM | POA: Diagnosis not present

## 2015-08-09 DIAGNOSIS — Z79899 Other long term (current) drug therapy: Secondary | ICD-10-CM | POA: Diagnosis not present

## 2015-08-09 DIAGNOSIS — R21 Rash and other nonspecific skin eruption: Secondary | ICD-10-CM | POA: Diagnosis not present

## 2015-08-09 DIAGNOSIS — R350 Frequency of micturition: Secondary | ICD-10-CM | POA: Diagnosis not present

## 2015-08-24 ENCOUNTER — Other Ambulatory Visit: Payer: Self-pay | Admitting: Cardiovascular Disease

## 2015-09-18 DIAGNOSIS — H52223 Regular astigmatism, bilateral: Secondary | ICD-10-CM | POA: Diagnosis not present

## 2015-09-18 DIAGNOSIS — H5203 Hypermetropia, bilateral: Secondary | ICD-10-CM | POA: Diagnosis not present

## 2015-09-18 DIAGNOSIS — H524 Presbyopia: Secondary | ICD-10-CM | POA: Diagnosis not present

## 2015-10-06 ENCOUNTER — Other Ambulatory Visit: Payer: Self-pay | Admitting: Internal Medicine

## 2015-11-14 ENCOUNTER — Ambulatory Visit (INDEPENDENT_AMBULATORY_CARE_PROVIDER_SITE_OTHER): Payer: PRIVATE HEALTH INSURANCE | Admitting: Nurse Practitioner

## 2015-11-14 ENCOUNTER — Encounter: Payer: Self-pay | Admitting: Nurse Practitioner

## 2015-11-14 ENCOUNTER — Other Ambulatory Visit: Payer: Self-pay | Admitting: Nurse Practitioner

## 2015-11-14 VITALS — BP 112/84 | HR 93 | Temp 98.5°F | Resp 20 | Ht 67.0 in | Wt 247.0 lb

## 2015-11-14 DIAGNOSIS — J0140 Acute pansinusitis, unspecified: Secondary | ICD-10-CM | POA: Diagnosis not present

## 2015-11-14 DIAGNOSIS — D849 Immunodeficiency, unspecified: Secondary | ICD-10-CM | POA: Diagnosis not present

## 2015-11-14 DIAGNOSIS — J209 Acute bronchitis, unspecified: Secondary | ICD-10-CM

## 2015-11-14 DIAGNOSIS — I255 Ischemic cardiomyopathy: Secondary | ICD-10-CM | POA: Diagnosis not present

## 2015-11-14 DIAGNOSIS — D899 Disorder involving the immune mechanism, unspecified: Secondary | ICD-10-CM

## 2015-11-14 MED ORDER — METHYLPREDNISOLONE ACETATE 40 MG/ML IJ SUSP
40.0000 mg | Freq: Once | INTRAMUSCULAR | Status: AC
  Administered 2015-11-14: 40 mg via INTRAMUSCULAR

## 2015-11-14 MED ORDER — FLUTICASONE PROPIONATE 50 MCG/ACT NA SUSP: 2.0000 | Freq: Every day | NASAL | 0 refills | Status: DC

## 2015-11-14 MED ORDER — OXYMETAZOLINE HCL 0.05 % NA SOLN: 1.0000 | Freq: Two times a day (BID) | NASAL | 0 refills | Status: DC

## 2015-11-14 MED ORDER — HYDROCODONE-HOMATROPINE 5-1.5 MG/5ML PO SYRP: 5.0000 mL | ORAL_SOLUTION | Freq: Every evening | ORAL | 0 refills | Status: DC | PRN

## 2015-11-14 MED ORDER — AZITHROMYCIN 250 MG PO TABS: 250.0000 mg | ORAL_TABLET | Freq: Every day | ORAL | 0 refills | Status: DC

## 2015-11-14 MED ORDER — DM-GUAIFENESIN ER 30-600 MG PO TB12: 1.0000 | ORAL_TABLET | Freq: Two times a day (BID) | ORAL | 0 refills | Status: DC | PRN

## 2015-11-14 MED ORDER — ALBUTEROL SULFATE (2.5 MG/3ML) 0.083% IN NEBU
2.5000 mg | INHALATION_SOLUTION | Freq: Once | RESPIRATORY_TRACT | Status: AC
  Administered 2015-11-14: 2.5 mg via RESPIRATORY_TRACT

## 2015-11-14 MED ORDER — ALBUTEROL SULFATE HFA 108 (90 BASE) MCG/ACT IN AERS: 2.0000 | INHALATION_SPRAY | Freq: Four times a day (QID) | RESPIRATORY_TRACT | 0 refills | Status: DC | PRN

## 2015-11-14 NOTE — Patient Instructions (Signed)
URI Instructions: Flonase and Afrin use: apply 1spray of afrin in each nare, wait 18mins, then apply 2sprays of flonase in each nare. Use both nasal spray consecutively x 3days, then flonase only for at least 14days.  Encourage adequate oral hydration. May also use saline nasal spray as needed.  Use over-the-counter  "cold" medicines  such as "Tylenol cold" , "Advil cold",  "Mucinex" or" Mucinex D"  for cough and congestion.  Avoid decongestants if you have high blood pressure. Use" Delsym" or" Robitussin" cough syrup varietis for cough.  You can use plain "Tylenol" or "Advi"l for fever, chills and achyness.   "Common cold" symptoms are usually triggered by a virus.  The antibiotics are usually not necessary. On average, a" viral cold" illness would take 4-7 days to resolve. Please, make an appointment if you are not better or if you're worse.  Start Azithromycin on Friday if no improvement in symptoms

## 2015-11-14 NOTE — Progress Notes (Signed)
Subjective:  Patient ID: [name], female    DOB: [birth year]  Age: 60 y.o. MRN: [id number]  CC: Cough (x5 days has had a cough, congestion, chest tightness, SOB)   Cough  This is a new problem. The current episode started in the past 7 days. The problem has been rapidly worsening. The problem occurs constantly. The cough is productive of sputum. Associated symptoms include chest pain, headaches, nasal congestion, postnasal drip, rhinorrhea, shortness of breath and wheezing. Pertinent negatives include no chills. Nothing aggravates the symptoms. She has tried OTC cough suppressant and body position changes for the symptoms. The treatment provided no relief. Her past medical history is significant for environmental allergies.   [name] presents for cough ans sinus congestion.  Outpatient Medications Prior to Visit  Medication Sig Dispense Refill  . aspirin 81 MG chewable tablet Chew 81 mg by mouth every morning.     Marland Kitchen atenolol (TENORMIN) 25 MG tablet Take 1 tablet (25 mg total) by mouth daily. 90 tablet 3  . azaTHIOprine (IMURAN) 50 MG tablet Take 50 mg by mouth 2 (two) times daily.    . furosemide (LASIX) 40 MG tablet Take 40 mg by mouth daily as needed for fluid.     . hydroxychloroquine (PLAQUENIL) 200 MG tablet Take 200 mg by mouth daily.     Marland Kitchen levothyroxine (SYNTHROID, LEVOTHROID) 50 MCG tablet TAKE 1 TABLET BY MOUTH EVERY DAY 90 tablet 0  . losartan (COZAAR) 25 MG tablet TAKE 1 TABLET BY MOUTH DAILY 90 tablet 2  . nitroGLYCERIN (NITROSTAT) 0.4 MG SL tablet Place 0.4 mg under the tongue every 5 (five) minutes x 3 doses as needed. For chest pain    . pantoprazole (PROTONIX) 40 MG tablet Take 1 tablet (40 mg total) by mouth daily. 90 tablet 3  . rosuvastatin (CRESTOR) 5 MG tablet TAKE 1 TABLET BY MOUTH DAILY OR AS DIRECTED 90 tablet 3   No facility-administered medications prior to visit.     ROS Review of Systems  Constitutional: Negative for chills.  HENT: Positive  for postnasal drip and rhinorrhea.   Respiratory: Positive for cough, shortness of breath and wheezing.   Cardiovascular: Positive for chest pain.  Allergic/Immunologic: Positive for environmental allergies.  Neurological: Positive for headaches.    Objective:  BP 112/84 (BP Location: Left Arm, Patient Position: Sitting, Cuff Size: Large)   Pulse 93   Temp 98.5 F (36.9 C) (Oral)   Resp 20   Ht 5\' 7"  (1.702 m)   Wt 247 lb (112 kg)   SpO2 93%   BMI 38.69 kg/m   BP Readings from Last 3 Encounters:  11/14/15 112/84  05/21/15 130/84  03/12/15 112/76    Wt Readings from Last 3 Encounters:  11/14/15 247 lb (112 kg)  05/21/15 247 lb 12.8 oz (112.4 kg)  03/12/15 245 lb 12.8 oz (111.5 kg)    Physical Exam  Constitutional: She is oriented to person, place, and time. She appears well-developed.  HENT:  Right Ear: Tympanic membrane and ear canal normal.  Left Ear: Tympanic membrane and ear canal normal.  Nose: Mucosal edema and rhinorrhea present. Right sinus exhibits maxillary sinus tenderness and frontal sinus tenderness. Left sinus exhibits maxillary sinus tenderness and frontal sinus tenderness.  Mouth/Throat: Uvula is midline. Posterior oropharyngeal erythema present. No oropharyngeal exudate or posterior oropharyngeal edema.  Neck: Normal range of motion. Neck supple.  Cardiovascular: Normal rate and normal heart sounds.   Pulmonary/Chest: Effort normal. No stridor. She has  wheezes. She has no rales.  Musculoskeletal: She exhibits no edema.  Lymphadenopathy:    She has cervical adenopathy.  Neurological: She is alert and oriented to person, place, and time.  Vitals reviewed.   Assessment & Plan:   [name] was seen today for cough.  Diagnoses and all orders for this visit:  Acute bronchitis, unspecified organism -     albuterol (PROVENTIL) (2.5 MG/3ML) 0.083% nebulizer solution 2.5 mg; Take 3 mLs (2.5 mg total) by nebulization once. -     albuterol (PROVENTIL  HFA;VENTOLIN HFA) 108 (90 Base) MCG/ACT inhaler; Inhale 2 puffs into the lungs every 6 (six) hours as needed for wheezing or shortness of breath. -     HYDROcodone-homatropine (HYCODAN) 5-1.5 MG/5ML syrup; Take 5 mLs by mouth at bedtime as needed for cough. -     dextromethorphan-guaiFENesin (MUCINEX DM) 30-600 MG 12hr tablet; Take 1 tablet by mouth 2 (two) times daily as needed for cough. -     methylPREDNISolone acetate (DEPO-MEDROL) injection 40 mg; Inject 1 mL (40 mg total) into the muscle once. -     fluticasone (FLONASE) 50 MCG/ACT nasal spray; Place 2 sprays into both nostrils daily. -     oxymetazoline (AFRIN NASAL SPRAY) 0.05 % nasal spray; Place 1 spray into both nostrils 2 (two) times daily. Use only for 3days, then stop -     azithromycin (ZITHROMAX Z-PAK) 250 MG tablet; Take 1 tablet (250 mg total) by mouth daily. Take 2tabs on first day, then 1tab once a day till complete  Acute pansinusitis, recurrence not specified -     albuterol (PROVENTIL) (2.5 MG/3ML) 0.083% nebulizer solution 2.5 mg; Take 3 mLs (2.5 mg total) by nebulization once. -     albuterol (PROVENTIL HFA;VENTOLIN HFA) 108 (90 Base) MCG/ACT inhaler; Inhale 2 puffs into the lungs every 6 (six) hours as needed for wheezing or shortness of breath. -     HYDROcodone-homatropine (HYCODAN) 5-1.5 MG/5ML syrup; Take 5 mLs by mouth at bedtime as needed for cough. -     dextromethorphan-guaiFENesin (MUCINEX DM) 30-600 MG 12hr tablet; Take 1 tablet by mouth 2 (two) times daily as needed for cough. -     methylPREDNISolone acetate (DEPO-MEDROL) injection 40 mg; Inject 1 mL (40 mg total) into the muscle once. -     fluticasone (FLONASE) 50 MCG/ACT nasal spray; Place 2 sprays into both nostrils daily. -     oxymetazoline (AFRIN NASAL SPRAY) 0.05 % nasal spray; Place 1 spray into both nostrils 2 (two) times daily. Use only for 3days, then stop -     azithromycin (ZITHROMAX Z-PAK) 250 MG tablet; Take 1 tablet (250 mg total) by mouth daily.  Take 2tabs on first day, then 1tab once a day till complete  Immunocompromised patient [facility])   I am having [name] start on albuterol, HYDROcodone-homatropine, dextromethorphan-guaiFENesin, fluticasone, oxymetazoline, and azithromycin. I am also having her maintain her nitroGLYCERIN, aspirin, hydroxychloroquine, azaTHIOprine, furosemide, atenolol, pantoprazole, rosuvastatin, losartan, and levothyroxine. We administered albuterol. We will continue to administer methylPREDNISolone acetate.  Meds ordered this encounter  Medications  . albuterol (PROVENTIL) (2.5 MG/3ML) 0.083% nebulizer solution 2.5 mg  . albuterol (PROVENTIL HFA;VENTOLIN HFA) 108 (90 Base) MCG/ACT inhaler    Sig: Inhale 2 puffs into the lungs every 6 (six) hours as needed for wheezing or shortness of breath.    Dispense:  1 Inhaler    Refill:  0    Order Specific Question:   Supervising Provider    Answer:   Alain Marion,  ALEKSEI V [[id number]]  . HYDROcodone-homatropine (HYCODAN) 5-1.5 MG/5ML syrup    Sig: Take 5 mLs by mouth at bedtime as needed for cough.    Dispense:  120 mL    Refill:  0    Order Specific Question:   Supervising Provider    Answer:   Cassandria Anger [[id number]]  . dextromethorphan-guaiFENesin (MUCINEX DM) 30-600 MG 12hr tablet    Sig: Take 1 tablet by mouth 2 (two) times daily as needed for cough.    Dispense:  14 tablet    Refill:  0    Order Specific Question:   Supervising Provider    Answer:   Cassandria Anger [[id number]]  . methylPREDNISolone acetate (DEPO-MEDROL) injection 40 mg  . fluticasone (FLONASE) 50 MCG/ACT nasal spray    Sig: Place 2 sprays into both nostrils daily.    Dispense:  16 g    Refill:  0    Order Specific Question:   Supervising Provider    Answer:   Cassandria Anger [[id number]]  . oxymetazoline (AFRIN NASAL SPRAY) 0.05 % nasal spray    Sig: Place 1 spray into both nostrils 2 (two) times daily. Use only for 3days, then stop    Dispense:  30 mL    Refill:  0    Order Specific  Question:   Supervising Provider    Answer:   Cassandria Anger [[id number]]  . azithromycin (ZITHROMAX Z-PAK) 250 MG tablet    Sig: Take 1 tablet (250 mg total) by mouth daily. Take 2tabs on first day, then 1tab once a day till complete    Dispense:  6 tablet    Refill:  0    Order Specific Question:   Supervising Provider    Answer:   Cassandria Anger [[id number]]     Follow-up: Return if symptoms worsen or fail to improve in 2weeks.  Wilfred Lacy, NP

## 2015-11-14 NOTE — Assessment & Plan Note (Signed)
Due to current use of Imuran and plaquenil.

## 2015-11-16 ENCOUNTER — Telehealth: Payer: Self-pay | Admitting: Cardiovascular Disease

## 2015-11-16 MED ORDER — METOPROLOL SUCCINATE ER 25 MG PO TB24: 25.0000 mg | ORAL_TABLET | Freq: Every day | ORAL | 3 refills | Status: DC

## 2015-11-16 NOTE — Telephone Encounter (Signed)
Spoke with the pharmacist at [facility] to endorse that per Dr Angelena Form, he would like to switch the pts atenolol to Toprol 25 mg po Qdaily, for a 90 day supply with 3 refills.  Informed the pharmacist that I tried calling the pt multiple times and left detailed messages on each VM, that Dr Angelena Form switched her Atenolol to Toprol, due to [place] shortage of atenolol.  Per the pharmacist [place], he will endorse this to the pt when she comes in today to pick her meds up.  Pharmacist verbalized understanding and agrees with this plan.  Will send this message to Dr Camillia Herter RN as an fyi on change of med.

## 2015-11-16 NOTE — Telephone Encounter (Signed)
[name] is calling to see if they can get an alternative prescription for the Atenolol 25mg  because it is on back order and the patient is out of the medication . Please call   Thanks

## 2015-11-16 NOTE — Telephone Encounter (Signed)
We can start Toprol 25 mg po Qdaily. Thanks, chris

## 2015-11-16 NOTE — Telephone Encounter (Signed)
Pharmacist Raechel Ache, from [place] is calling to inform Dr Angelena Form that the pt is completely out of her atenolol and there is a Tree surgeon on this drug as well, so he would need to advise on a different regimen for this pt to take.  [place] states the pt called in for a refill today and noted she was on atenolol.  An alternative medication will need to be advised from Dr Angelena Form.  Informed the pharmacist that Dr Angelena Form is out of the office today, but is working in the [facility] that I will route this message to him for further review and recommendation on a different regimen in place of atenolol, and someone from the office will follow-up with the pt accordingly thereafter.  [place] verbalized understanding and agrees with this plan.  Raechel Ache will endorse this information to the pt.

## 2015-11-18 ENCOUNTER — Observation Stay (HOSPITAL_COMMUNITY)
Admission: EM | Admit: 2015-11-18 | Discharge: 2015-11-19 | Disposition: A | Discharge status: Home / Self Care | Payer: PRIVATE HEALTH INSURANCE | Attending: Internal Medicine | Admitting: Internal Medicine

## 2015-11-18 ENCOUNTER — Encounter (HOSPITAL_COMMUNITY): Payer: Self-pay | Admitting: Emergency Medicine

## 2015-11-18 ENCOUNTER — Emergency Department (HOSPITAL_COMMUNITY): Payer: PRIVATE HEALTH INSURANCE

## 2015-11-18 DIAGNOSIS — I255 Ischemic cardiomyopathy: Secondary | ICD-10-CM | POA: Diagnosis not present

## 2015-11-18 DIAGNOSIS — Z955 Presence of coronary angioplasty implant and graft: Secondary | ICD-10-CM | POA: Insufficient documentation

## 2015-11-18 DIAGNOSIS — F17210 Nicotine dependence, cigarettes, uncomplicated: Secondary | ICD-10-CM | POA: Diagnosis not present

## 2015-11-18 DIAGNOSIS — I252 Old myocardial infarction: Secondary | ICD-10-CM | POA: Insufficient documentation

## 2015-11-18 DIAGNOSIS — I739 Peripheral vascular disease, unspecified: Secondary | ICD-10-CM | POA: Diagnosis not present

## 2015-11-18 DIAGNOSIS — R112 Nausea with vomiting, unspecified: Secondary | ICD-10-CM | POA: Insufficient documentation

## 2015-11-18 DIAGNOSIS — D693 Immune thrombocytopenic purpura: Secondary | ICD-10-CM | POA: Insufficient documentation

## 2015-11-18 DIAGNOSIS — M3214 Glomerular disease in systemic lupus erythematosus: Secondary | ICD-10-CM | POA: Diagnosis not present

## 2015-11-18 DIAGNOSIS — Z7982 Long term (current) use of aspirin: Secondary | ICD-10-CM | POA: Diagnosis not present

## 2015-11-18 DIAGNOSIS — Z79899 Other long term (current) drug therapy: Secondary | ICD-10-CM | POA: Diagnosis not present

## 2015-11-18 DIAGNOSIS — I10 Essential (primary) hypertension: Secondary | ICD-10-CM

## 2015-11-18 DIAGNOSIS — I2510 Atherosclerotic heart disease of native coronary artery without angina pectoris: Secondary | ICD-10-CM | POA: Diagnosis not present

## 2015-11-18 DIAGNOSIS — K219 Gastro-esophageal reflux disease without esophagitis: Secondary | ICD-10-CM | POA: Diagnosis not present

## 2015-11-18 DIAGNOSIS — I259 Chronic ischemic heart disease, unspecified: Secondary | ICD-10-CM

## 2015-11-18 DIAGNOSIS — E785 Hyperlipidemia, unspecified: Secondary | ICD-10-CM | POA: Insufficient documentation

## 2015-11-18 DIAGNOSIS — Z8249 Family history of ischemic heart disease and other diseases of the circulatory system: Secondary | ICD-10-CM | POA: Insufficient documentation

## 2015-11-18 DIAGNOSIS — R42 Dizziness and giddiness: Secondary | ICD-10-CM | POA: Insufficient documentation

## 2015-11-18 DIAGNOSIS — Z9081 Acquired absence of spleen: Secondary | ICD-10-CM | POA: Diagnosis not present

## 2015-11-18 DIAGNOSIS — R0789 Other chest pain: Secondary | ICD-10-CM | POA: Diagnosis not present

## 2015-11-18 DIAGNOSIS — E039 Hypothyroidism, unspecified: Secondary | ICD-10-CM | POA: Diagnosis not present

## 2015-11-18 DIAGNOSIS — R079 Chest pain, unspecified: Secondary | ICD-10-CM | POA: Diagnosis present

## 2015-11-18 DIAGNOSIS — M329 Systemic lupus erythematosus, unspecified: Secondary | ICD-10-CM | POA: Insufficient documentation

## 2015-11-18 DIAGNOSIS — Z9049 Acquired absence of other specified parts of digestive tract: Secondary | ICD-10-CM | POA: Diagnosis not present

## 2015-11-18 LAB — BASIC METABOLIC PANEL
Anion gap: 5 (ref 5–15)
BUN: 16 mg/dL (ref 6–20)
CO2: 28 mmol/L (ref 22–32)
Calcium: 9.6 mg/dL (ref 8.9–10.3)
Chloride: 107 mmol/L (ref 101–111)
Creatinine, Ser: 0.9 mg/dL (ref 0.44–1.00)
GFR calc Af Amer: >60 mL/min (ref >60)
GFR calc non Af Amer: >60 mL/min (ref >60)
Glucose, Bld: 100 mg/dL — ABNORMAL HIGH (ref 65–99)
Potassium: 4.2 mmol/L (ref 3.5–5.1)
Sodium: 140 mmol/L (ref 135–145)

## 2015-11-18 LAB — I-STAT TROPONIN, ED: Troponin i, poc: 0.01 ng/mL (ref 0.00–0.08)

## 2015-11-18 LAB — CBC
HCT: 40.1 % (ref 36.0–46.0)
Hemoglobin: 13.6 g/dL (ref 12.0–15.0)
MCH: 33.3 pg (ref 26.0–34.0)
MCHC: 33.9 g/dL (ref 30.0–36.0)
MCV: 98.3 fL (ref 78.0–100.0)
Platelets: 179 K/uL (ref 150–400)
RBC: 4.08 MIL/uL (ref 3.87–5.11)
RDW: 15.1 % (ref 11.5–15.5)
WBC: 3.7 K/uL — ABNORMAL LOW (ref 4.0–10.5)

## 2015-11-18 LAB — HEPATIC FUNCTION PANEL
ALT: 18 U/L (ref 14–54)
AST: 28 U/L (ref 15–41)
Albumin: 3.9 g/dL (ref 3.5–5.0)
Alkaline Phosphatase: 75 U/L (ref 38–126)
Bilirubin, Direct: <0.1 mg/dL — ABNORMAL LOW (ref 0.1–0.5)
Total Bilirubin: 0.3 mg/dL (ref 0.3–1.2)
Total Protein: 7.4 g/dL (ref 6.5–8.1)

## 2015-11-18 LAB — LIPASE, BLOOD: Lipase: 18 U/L (ref 11–51)

## 2015-11-18 LAB — TROPONIN I: Troponin I: <0.03 ng/mL (ref <0.03)

## 2015-11-18 MED ORDER — ASPIRIN 81 MG PO CHEW
324.0000 mg | CHEWABLE_TABLET | Freq: Once | ORAL | Status: AC
  Administered 2015-11-18: 324 mg via ORAL
  Filled 2015-11-18: qty 4

## 2015-11-18 MED ORDER — LEVOTHYROXINE SODIUM 50 MCG PO TABS
50.0000 ug | ORAL_TABLET | Freq: Every day | ORAL | Status: DC
  Administered 2015-11-19: 50 ug via ORAL
  Filled 2015-11-18 (×2): qty 1

## 2015-11-18 MED ORDER — HYDROXYCHLOROQUINE SULFATE 200 MG PO TABS
200.0000 mg | ORAL_TABLET | Freq: Every day | ORAL | Status: DC
  Administered 2015-11-19: 200 mg via ORAL
  Filled 2015-11-18: qty 1

## 2015-11-18 MED ORDER — ONDANSETRON HCL 4 MG/2ML IJ SOLN: 4.0000 mg | Freq: Four times a day (QID) | INTRAMUSCULAR | Status: DC | PRN

## 2015-11-18 MED ORDER — GI COCKTAIL ~~LOC~~
30.0000 mL | Freq: Once | ORAL | Status: AC
  Administered 2015-11-18: 30 mL via ORAL
  Filled 2015-11-18: qty 30

## 2015-11-18 MED ORDER — ONDANSETRON HCL 4 MG/2ML IJ SOLN
4.0000 mg | Freq: Once | INTRAMUSCULAR | Status: AC
  Administered 2015-11-18: 4 mg via INTRAVENOUS
  Filled 2015-11-18: qty 2

## 2015-11-18 MED ORDER — LOSARTAN POTASSIUM 50 MG PO TABS
25.0000 mg | ORAL_TABLET | Freq: Every day | ORAL | Status: DC
  Administered 2015-11-19: 25 mg via ORAL
  Filled 2015-11-18: qty 1

## 2015-11-18 MED ORDER — ASPIRIN 81 MG PO CHEW
81.0000 mg | CHEWABLE_TABLET | Freq: Every day | ORAL | Status: DC
  Administered 2015-11-19: 81 mg via ORAL
  Filled 2015-11-18: qty 1

## 2015-11-18 MED ORDER — HYDROCODONE-HOMATROPINE 5-1.5 MG/5ML PO SYRP: 5.0000 mL | ORAL_SOLUTION | Freq: Every evening | ORAL | Status: DC | PRN

## 2015-11-18 MED ORDER — MORPHINE SULFATE (PF) 2 MG/ML IV SOLN: 2.0000 mg | INTRAVENOUS | Status: DC | PRN

## 2015-11-18 MED ORDER — ALBUTEROL SULFATE (2.5 MG/3ML) 0.083% IN NEBU: 2.5000 mg | INHALATION_SOLUTION | Freq: Four times a day (QID) | RESPIRATORY_TRACT | Status: DC | PRN

## 2015-11-18 MED ORDER — METOPROLOL SUCCINATE ER 25 MG PO TB24
25.0000 mg | ORAL_TABLET | Freq: Every day | ORAL | Status: DC
  Administered 2015-11-18: 25 mg via ORAL
  Filled 2015-11-18: qty 1

## 2015-11-18 MED ORDER — ENOXAPARIN SODIUM 40 MG/0.4ML ~~LOC~~ SOLN
40.0000 mg | Freq: Every day | SUBCUTANEOUS | Status: DC
  Administered 2015-11-18: 40 mg via SUBCUTANEOUS
  Filled 2015-11-18: qty 0.4

## 2015-11-18 MED ORDER — AZATHIOPRINE 50 MG PO TABS
50.0000 mg | ORAL_TABLET | Freq: Two times a day (BID) | ORAL | Status: DC
  Administered 2015-11-18 – 2015-11-19 (×2): 50 mg via ORAL
  Filled 2015-11-18 (×3): qty 1

## 2015-11-18 MED ORDER — PANTOPRAZOLE SODIUM 40 MG PO TBEC
40.0000 mg | DELAYED_RELEASE_TABLET | Freq: Every day | ORAL | Status: DC
  Administered 2015-11-19: 40 mg via ORAL
  Filled 2015-11-18: qty 1

## 2015-11-18 MED ORDER — ROSUVASTATIN CALCIUM 5 MG PO TABS
5.0000 mg | ORAL_TABLET | ORAL | Status: DC
  Administered 2015-11-18: 5 mg via ORAL
  Filled 2015-11-18 (×2): qty 1

## 2015-11-18 MED ORDER — FLUTICASONE PROPIONATE 50 MCG/ACT NA SUSP
2.0000 | Freq: Every day | NASAL | Status: DC | PRN
  Filled 2015-11-18: qty 16

## 2015-11-18 MED ORDER — GI COCKTAIL ~~LOC~~: 30.0000 mL | Freq: Four times a day (QID) | ORAL | Status: DC | PRN

## 2015-11-18 MED ORDER — ACETAMINOPHEN 325 MG PO TABS: 650.0000 mg | ORAL_TABLET | ORAL | Status: DC | PRN

## 2015-11-18 NOTE — ED Provider Notes (Signed)
[facility] DEPT Provider Note   CSN: [id number] Arrival date & time: 11/18/15  2020  By signing my name below, I, Jasmyn B. Alexander, attest that this documentation has been prepared under the direction and in the presence of Cortney Mckinney, PA-C. Electronically Signed: Tedra Coupe. Sheppard Coil, ED Scribe. 11/18/15. 8:42 PM.  History   Chief Complaint Chief Complaint  Patient presents with  . Chest Pain    HPI HPI Comments: [name] is a 60 y.o. female with PMHx of MI, PVD, lupus, and chronic ischemic heart disease who presents to the Emergency Department complaining of gradual onset, mild, radiating central chest pain to right shoulder x 5 hrs. Pt has associated headache, dizziness, nausea, and vomiting. Pain feels moderately similar to past heart attacks, but she notes that pain also feels like indigestion. She reports that she has had pain in her shoulder for the past week. She notes that chest pain mildly resolved after vomiting. Last 2 heart attacks were in 2003 in which she later had two stent placements and an angioplasty. Pt has recently received new medications after having a severe asthma attack to chemicals used for carpet cleaning. Pt has PSHx of cholecystectomy and splenectomy. Denies any shortness of breath, abdominal pain, or diaphoresis.  Per chart review: Pt was admitted and discharged from [facility] in December 2013 after worsening chest pain which was diagnosed as fluid volume overload and lupus complications. No myocardial infarction at that time.  The history is provided by the patient. No language interpreter was used.   Past Medical History:  Diagnosis Date  . Chronic ischemic heart disease, unspecified    w/ stents in mid circumflex and mid RCA in 03, and balloon angioplasty of Diagonal branch 10-03, last cath Sept 08- 2 vessel disease w/ patent stents  . Esophageal reflux   . Hypothyroidism   . Immune thrombocytopenic purpura ([place])   . Lupus erythematosus    . Mitral valve insufficiency and aortic valve insufficiency   . Myocardial infarction ([place])    x 2  . Peripheral vascular disease, unspecified ([place])   . Unspecified disease of pericardium     Patient Active Problem List   Diagnosis Date Noted  . Immunocompromised patient ([place]) 11/14/2015  . CAP (community acquired pneumonia) 03/12/2015  . Acute sinusitis 08/23/2014  . Health care maintenance 04/26/2011  . Hypothyroidism 04/08/2011  . Mitral valve disorders 02/26/2009  . Hyperlipidemia 01/26/2009  . Cough 12/09/2007  . Immune thrombocytopenic purpura ([place]) 08/05/2007  . Chronic ischemic heart disease 08/05/2007  . PVD 08/05/2007  . DVT 08/05/2007  . GERD 08/05/2007  . Systemic lupus erythematosus with focal and segmental proliferative glomerulonephritis ([place]) 08/05/2007    Past Surgical History:  Procedure Laterality Date  . CHOLECYSTECTOMY, LAPAROSCOPIC  2010  . plastic surgical repair      dog bite to leg  . SPLENECTOMY  5-04    OB History    No data available       Home Medications    Prior to Admission medications   Medication Sig Start Date End Date Taking? Authorizing Provider  albuterol (PROVENTIL HFA;VENTOLIN HFA) 108 (90 Base) MCG/ACT inhaler Inhale 2 puffs into the lungs every 6 (six) hours as needed for wheezing or shortness of breath. 11/14/15   Flossie Buffy, NP  aspirin 81 MG chewable tablet Chew 81 mg by mouth every morning.     Historical Provider, MD  azaTHIOprine (IMURAN) 50 MG tablet Take 50 mg by mouth 2 (two) times daily.  Historical Provider, MD  azithromycin (ZITHROMAX Z-PAK) 250 MG tablet Take 1 tablet (250 mg total) by mouth daily. Take 2tabs on first day, then 1tab once a day till complete 11/16/15   Flossie Buffy, NP  dextromethorphan-guaiFENesin [facility] DM) 30-600 MG 12hr tablet Take 1 tablet by mouth 2 (two) times daily as needed for cough. 11/14/15   Flossie Buffy, NP  fluticasone (FLONASE) 50 MCG/ACT nasal spray Place 2  sprays into both nostrils daily. 11/14/15   Flossie Buffy, NP  furosemide (LASIX) 40 MG tablet Take 40 mg by mouth daily as needed for fluid.  07/08/12   Neena Rhymes, MD  HYDROcodone-homatropine [facility]) 5-1.5 MG/5ML syrup Take 5 mLs by mouth at bedtime as needed for cough. 11/14/15   Flossie Buffy, NP  hydroxychloroquine (PLAQUENIL) 200 MG tablet Take 200 mg by mouth daily.  09/19/12   Historical Provider, MD  levothyroxine (SYNTHROID, LEVOTHROID) 50 MCG tablet TAKE 1 TABLET BY MOUTH EVERY DAY 10/08/15   Hoyt Koch, MD  losartan (COZAAR) 25 MG tablet TAKE 1 TABLET BY MOUTH DAILY 08/24/15   Burnell Blanks, MD  metoprolol succinate (TOPROL XL) 25 MG 24 hr tablet Take 1 tablet (25 mg total) by mouth daily. 11/16/15   Burnell Blanks, MD  nitroGLYCERIN (NITROSTAT) 0.4 MG SL tablet Place 0.4 mg under the tongue every 5 (five) minutes x 3 doses as needed. For chest pain    Historical Provider, MD  oxymetazoline (AFRIN NASAL SPRAY) 0.05 % nasal spray Place 1 spray into both nostrils 2 (two) times daily. Use only for 3days, then stop 11/14/15   Flossie Buffy, NP  pantoprazole (PROTONIX) 40 MG tablet Take 1 tablet (40 mg total) by mouth daily. 08/03/15   Burnell Blanks, MD  rosuvastatin (CRESTOR) 5 MG tablet TAKE 1 TABLET BY MOUTH DAILY OR AS DIRECTED 08/03/15   Burnell Blanks, MD    Family History Family History  Problem Relation Age of Onset  . Cancer Mother     breast and cervical  . Paget's disease of bone Mother   . Fibromyalgia Mother   . Hypertension Father   . Heart disease Father     PVD  . GER disease Father   . Cancer Paternal Grandfather     colon  . Heart attack Other   . Coronary artery disease Other   . Diabetes Other   . Hypertension Other   . Hyperlipidemia Other     Social History Social History  Substance Use Topics  . Smoking status: Current Some Day Smoker    Packs/day: 0.50    Years: 30.00    Types: Cigarettes     Last attempt to quit: 12/23/2011  . Smokeless tobacco: Never Used  . Alcohol use No     Allergies   Review of patient's allergies indicates no known allergies.   Review of Systems Review of Systems  Constitutional: Negative for diaphoresis.  Respiratory: Negative for shortness of breath.   Cardiovascular: Positive for chest pain.  Gastrointestinal: Positive for nausea and vomiting. Negative for abdominal pain.  Neurological: Positive for dizziness and headaches.  All other systems reviewed and are negative.  Physical Exam Updated Vital Signs BP 125/75   Pulse 74   Temp 98.1 F (36.7 C) (Oral)   Resp 18   SpO2 100%   Physical Exam  Constitutional: She is oriented to person, place, and time. She appears well-developed and well-nourished.  HENT:  Head: Normocephalic and atraumatic.  Eyes: EOM are normal. Pupils are equal, round, and reactive to light.  Neck: Normal range of motion.  Cardiovascular: Normal rate, regular rhythm and normal heart sounds.   Pulmonary/Chest: Effort normal and breath sounds normal. No respiratory distress. She has no wheezes. She has no rales.  Abdominal: Soft. Bowel sounds are normal. She exhibits no distension. There is no tenderness. There is no rebound and no guarding.  Musculoskeletal: Normal range of motion.  Neurological: She is alert and oriented to person, place, and time.  Skin: Skin is warm and dry. No rash noted.  Psychiatric: She has a normal mood and affect. Judgment normal.  Nursing note and vitals reviewed.    ED Treatments / Results  DIAGNOSTIC STUDIES: Oxygen Saturation is 100% on RA, normal by my interpretation.    COORDINATION OF CARE: 8:42 PM-Discussed treatment plan which includes CXR, BMP, CBC, and Troponin with pt at bedside and pt agreed to plan.   Labs (all labs ordered are listed, but only abnormal results are displayed) Labs Reviewed  BASIC METABOLIC PANEL - Abnormal; Notable for the following:       Result  Value   Glucose, Bld 100 (*)    All other components within normal limits  CBC - Abnormal; Notable for the following:    WBC 3.7 (*)    All other components within normal limits  HEPATIC FUNCTION PANEL - Abnormal; Notable for the following:    Bilirubin, Direct <0.1 (*)    All other components within normal limits  LIPASE, BLOOD  TROPONIN I  TROPONIN I  TROPONIN I  I-STAT TROPOININ, ED   EKG  EKG Interpretation  Date/Time:  Sunday November 18 2015 20:24:39 EDT Ventricular Rate:  65 PR Interval:    QRS Duration: 88 QT Interval:  440 QTC Calculation: 458 R Axis:   36 Text Interpretation:  Sinus rhythm Left atrial enlargement Low voltage, precordial leads Confirmed by ALLEN  MD, ANTHONY ([id number]) on 11/18/2015 9:51:39 PM      Radiology Dg Chest 2 View  Result Date: 11/18/2015 CLINICAL DATA:  Upper interscapular and right back pain for several days. Nausea and lightheadedness began 5 hours ago. EXAM: CHEST  2 VIEW COMPARISON:  03/03/2015 FINDINGS: The heart size and mediastinal contours are within normal limits. Both lungs are clear. The visualized skeletal structures are unremarkable. IMPRESSION: No active cardiopulmonary disease. Electronically Signed   By: Andreas Newport M.D.   On: 11/18/2015 21:18    Procedures Procedures (including critical care time)  Medications Ordered in ED Medications - No data to display   Initial Impression / Assessment and Plan / ED Course  I have reviewed the triage vital signs and the nursing notes.  Pertinent labs & imaging results that were available during my care of the patient were reviewed by me and considered in my medical decision making (see chart for details).  Clinical Course  Comment By Time  Patient seen and examined. Patient with episode of chest pain that started earlier this afternoon, with nausea and vomiting. Exam unremarkable. States feels like her prior heart attack only not severe. No abdominal pain or tenderness. Labs  ordered, EKG ordered. Will give Zofran for nausea, aspirin, will try GI cocktail. Vital signs are normal at this time. She is not hypoxic, tachypnea, tachycardia, I do not think she has a pulmonary embolism. Jeannett Senior, PA-C 08/27 2100  Troponin and labs unremarkable. Pt has some new T wave inversions in lead III and AVF. Discussed with Dr. Zenia Resides, will  admit for CP ro.  Jeannett Senior, PA-C 08/27 2237  Spoke with [facility], will admit. Pt aware, agrees with the plan.  Jeannett Senior, PA-C 08/27 2238      Final Clinical Impressions(s) / ED Diagnoses   Final diagnoses:  Chest pain, unspecified chest pain type    New Prescriptions New Prescriptions   No medications on file   I personally performed the services described in this documentation, which was scribed in my presence. The recorded information has been reviewed and is accurate.    Jeannett Senior, PA-C 11/18/15 2239    Lacretia Leigh, MD 11/18/15 2308

## 2015-11-18 NOTE — H&P (Signed)
History and Physical    KALICIA WICHT [id number] DOB: 04/03/1955 DOA: 11/18/2015   PCP: Hoyt Koch, MD Chief Complaint:  Chief Complaint  Patient presents with  . Chest Pain    HPI: [name] is a 60 y.o. female with medical history significant of MI, 2 stents in 2003, chronic hypokinesis of inferior wall with EF 45-50%.  Patient presents to the ED with 1 week history of R shoulder pain, 1 day history of chest pain, N/V.  Symptoms of CP and SOB onset about 5 hours ago.  Laying down and tylenol makes it better, nothing seems to make it worse, she hasnt noted any association with exertion.  Chest pain has resolved at this time.  ED Course: EKG shows T wave inversion and more pronounced Q waves in leads II and III when compared to prior.  Review of Systems: As per HPI otherwise 10 point review of systems negative.    Past Medical History:  Diagnosis Date  . Chronic ischemic heart disease, unspecified    w/ stents in mid circumflex and mid RCA in 03, and balloon angioplasty of Diagonal branch 10-03, last cath Sept 08- 2 vessel disease w/ patent stents  . Esophageal reflux   . Hypothyroidism   . Immune thrombocytopenic purpura ([place])   . Lupus erythematosus   . Mitral valve insufficiency and aortic valve insufficiency   . Myocardial infarction ([place])    x 2  . Peripheral vascular disease, unspecified ([place])   . Unspecified disease of pericardium     Past Surgical History:  Procedure Laterality Date  . CHOLECYSTECTOMY, LAPAROSCOPIC  2010  . plastic surgical repair      dog bite to leg  . SPLENECTOMY  5-04     reports that she has been smoking Cigarettes.  She has a 15.00 pack-year smoking history. She has never used smokeless tobacco. She reports that she does not drink alcohol or use drugs.  No Known Allergies  Family History  Problem Relation Age of Onset  . Cancer Mother     breast and cervical  . Paget's disease of bone Mother   . Fibromyalgia  Mother   . Hypertension Father   . Heart disease Father     PVD  . GER disease Father   . Cancer Paternal Grandfather     colon  . Heart attack Other   . Coronary artery disease Other   . Diabetes Other   . Hypertension Other   . Hyperlipidemia Other       Prior to Admission medications   Medication Sig Start Date End Date Taking? Authorizing Provider  albuterol (PROVENTIL HFA;VENTOLIN HFA) 108 (90 Base) MCG/ACT inhaler Inhale 2 puffs into the lungs every 6 (six) hours as needed for wheezing or shortness of breath. 11/14/15  Yes Flossie Buffy, NP  aspirin 81 MG chewable tablet Chew 81 mg by mouth daily.    Yes Historical Provider, MD  azaTHIOprine (IMURAN) 50 MG tablet Take 50 mg by mouth 2 (two) times daily.   Yes Historical Provider, MD  azithromycin (ZITHROMAX) 250 MG tablet Take 250-500 mg by mouth daily. Pt is to take two tablets on day 1 and one tablet daily for the next four days. 11/16/15 11/21/15 Yes Historical Provider, MD  fluticasone (FLONASE) 50 MCG/ACT nasal spray Place 2 sprays into both nostrils daily as needed for rhinitis.   Yes Historical Provider, MD  furosemide (LASIX) 40 MG tablet Take 40 mg by mouth daily as needed  for edema.    Yes Neena Rhymes, MD  HYDROcodone-homatropine [facility]) 5-1.5 MG/5ML syrup Take 5 mLs by mouth at bedtime as needed for cough. 11/14/15  Yes Charlene Brooke Nche, NP  hydroxychloroquine (PLAQUENIL) 200 MG tablet Take 200 mg by mouth daily.    Yes Historical Provider, MD  levothyroxine (SYNTHROID, LEVOTHROID) 50 MCG tablet Take 50 mcg by mouth daily before breakfast.   Yes Historical Provider, MD  losartan (COZAAR) 25 MG tablet Take 25 mg by mouth daily.   Yes Historical Provider, MD  metoprolol succinate (TOPROL-XL) 25 MG 24 hr tablet Take 25 mg by mouth at bedtime.   Yes Historical Provider, MD  nitroGLYCERIN (NITROSTAT) 0.4 MG SL tablet Place 0.4 mg under the tongue every 5 (five) minutes x 3 doses as needed for chest pain.    Yes  Historical Provider, MD  pantoprazole (PROTONIX) 40 MG tablet Take 1 tablet (40 mg total) by mouth daily. 08/03/15  Yes Burnell Blanks, MD  rosuvastatin (CRESTOR) 5 MG tablet Take 5 mg by mouth every other day.   Yes Historical Provider, MD    Physical Exam: Vitals:   11/18/15 2102 11/18/15 2130 11/18/15 2133 11/18/15 2215  BP: 136/82 165/67 165/67 179/99  Pulse: 61 (!) 57 (!) 59 62  Resp: 14 15 16 16   Temp:      TempSrc:      SpO2: 95% 93% 93% 97%      Constitutional: NAD, calm, comfortable Eyes: PERRL, lids and conjunctivae normal ENMT: Mucous membranes are moist. Posterior pharynx clear of any exudate or lesions.Normal dentition.  Neck: normal, supple, no masses, no thyromegaly Respiratory: clear to auscultation bilaterally, no wheezing, no crackles. Normal respiratory effort. No accessory muscle use.  Cardiovascular: Regular rate and rhythm, no murmurs / rubs / gallops. No extremity edema. 2+ pedal pulses. No carotid bruits.  Abdomen: no tenderness, no masses palpated. No hepatosplenomegaly. Bowel sounds positive.  Musculoskeletal: no clubbing / cyanosis. No joint deformity upper and lower extremities. Good ROM, no contractures. Normal muscle tone.  Skin: no rashes, lesions, ulcers. No induration Neurologic: CN 2-12 grossly intact. Sensation intact, DTR normal. Strength 5/5 in all 4.  Psychiatric: Normal judgment and insight. Alert and oriented x 3. Normal mood.    Labs on Admission: I have personally reviewed following labs and imaging studies  CBC:  Recent Labs Lab 11/18/15 2036  WBC 3.7*  HGB 13.6  HCT 40.1  MCV 98.3  PLT 0000000   Basic Metabolic Panel:  Recent Labs Lab 11/18/15 2036  NA 140  K 4.2  CL 107  CO2 28  GLUCOSE 100*  BUN 16  CREATININE 0.90  CALCIUM 9.6   GFR: Estimated Creatinine Clearance: 86.9 mL/min (by C-G formula based on SCr of 0.9 mg/dL). Liver Function Tests:  Recent Labs Lab 11/18/15 2036  AST 28  ALT 18  ALKPHOS 75    BILITOT 0.3  PROT 7.4  ALBUMIN 3.9    Recent Labs Lab 11/18/15 2036  LIPASE 18   No results for input(s): AMMONIA in the last 168 hours. Coagulation Profile: No results for input(s): INR, PROTIME in the last 168 hours. Cardiac Enzymes: No results for input(s): CKTOTAL, CKMB, CKMBINDEX, TROPONINI in the last 168 hours. BNP (last 3 results) No results for input(s): PROBNP in the last 8760 hours. HbA1C: No results for input(s): HGBA1C in the last 72 hours. CBG: No results for input(s): GLUCAP in the last 168 hours. Lipid Profile: No results for input(s): CHOL, HDL, LDLCALC, TRIG,  CHOLHDL, LDLDIRECT in the last 72 hours. Thyroid Function Tests: No results for input(s): TSH, T4TOTAL, FREET4, T3FREE, THYROIDAB in the last 72 hours. Anemia Panel: No results for input(s): VITAMINB12, FOLATE, FERRITIN, TIBC, IRON, RETICCTPCT in the last 72 hours. Urine analysis:    Component Value Date/Time   COLORURINE YELLOW 03/10/2012 0015   APPEARANCEUR HAZY (A) 03/10/2012 0015   LABSPEC 1.024 03/10/2012 0015   PHURINE 7.5 03/10/2012 0015   GLUCOSEU NEGATIVE 03/10/2012 0015   GLUCOSEU NEGATIVE 03/04/2012 1214   HGBUR TRACE (A) 03/10/2012 0015   BILIRUBINUR NEGATIVE 03/10/2012 0015   KETONESUR NEGATIVE 03/10/2012 0015   PROTEINUR >300 (A) 03/10/2012 0015   UROBILINOGEN 0.2 03/10/2012 0015   NITRITE NEGATIVE 03/10/2012 0015   LEUKOCYTESUR TRACE (A) 03/10/2012 0015   Sepsis Labs: @LABRCNTIP (procalcitonin:4,lacticidven:4) )No results found for this or any previous visit (from the past 240 hour(s)).   Radiological Exams on Admission: Dg Chest 2 View  Result Date: 11/18/2015 CLINICAL DATA:  Upper interscapular and right back pain for several days. Nausea and lightheadedness began 5 hours ago. EXAM: CHEST  2 VIEW COMPARISON:  03/03/2015 FINDINGS: The heart size and mediastinal contours are within normal limits. Both lungs are clear. The visualized skeletal structures are unremarkable.  IMPRESSION: No active cardiopulmonary disease. Electronically Signed   By: Andreas Newport M.D.   On: 11/18/2015 21:18    EKG: Independently reviewed.  Assessment/Plan Principal Problem:   Chest pain, rule out acute myocardial infarction Active Problems:   Chronic ischemic heart disease   Systemic lupus erythematosus with focal and segmental proliferative glomerulonephritis ([place])    1. Chest pain rule out - 1. CP obs pathway 2. Serial trops 3. Tele monitor 4. Likely warrants cards eval in AM re: stress test vs heart cath given risk 5. HEART score of 6 2. SLE - 1. Continue immunosuppressives 3. HTN - continue home meds   DVT prophylaxis: Lovenox Code Status: Full Family Communication: No family in room Consults called: None Admission status: Admit to obs   [place], [place] [facility] Pager [phone] from 7PM-7AM  If 7AM-7PM, please contact the day physician for the patient [email or web address] Password [email or web address]  11/18/2015, 10:41 PM

## 2015-11-18 NOTE — ED Triage Notes (Signed)
Pt transported by POV for c/o CP, upper central dull radiating to R shoulder/back x several days. Pt states today she began having severe nausea and dizziness 4-5 hours ago. +shob

## 2015-11-19 ENCOUNTER — Other Ambulatory Visit: Payer: Self-pay | Admitting: Student

## 2015-11-19 DIAGNOSIS — R0789 Other chest pain: Secondary | ICD-10-CM | POA: Diagnosis not present

## 2015-11-19 DIAGNOSIS — R079 Chest pain, unspecified: Secondary | ICD-10-CM

## 2015-11-19 DIAGNOSIS — I2510 Atherosclerotic heart disease of native coronary artery without angina pectoris: Secondary | ICD-10-CM

## 2015-11-19 LAB — GLUCOSE, CAPILLARY: Glucose-Capillary: 98 mg/dL (ref 65–99)

## 2015-11-19 LAB — TROPONIN I: Troponin I: <0.03 ng/mL

## 2015-11-19 NOTE — Consult Note (Signed)
Cardiology Consult    Patient ID: [name] MRN: [id number], DOB/AGE: 1956/03/14   Admit date: 11/18/2015 Date of Consult: 11/19/2015  Primary Physician: Hoyt Koch, MD Reason for Consult: Chest Pain Primary Cardiologist: Dr. Angelena Form Requesting Provider: Dr. Doyle Askew   History of Present Illness    [name] is a 60 y.o. female with past medical history of CAD (s/p BMS to RCA and LCx in 2003, PTCA to diagonal in 2003), ischemic cardiomyopathy (EF 45-50% by echo in 08/2014) moderate MR, HLD, GERD, hypothyroidism, SLE, and tobacco use who presented to [facility] ED on 11/18/2015 for evaluation of chest pain and dizziness.   Patient reports she developed a centralized chest discomfort yesterday morning which lasted for greater than 5 hours. Says it felt similar to previous episodes of indigestion. Was accompanied by nausea, vomiting, and dizziness. Overall, lasted for greater than 5 hours and no association with exertion, mostly relieved with GI cocktail. Felt different from previous MI's in 2003 when she had intense shoulder pain.   She works as a Clinical biochemist at [employer] and says she walks around the entire hospital multiple times a day without any recurrent anginal symptoms. Denies any recent episodes of chest discomfort prior to yesterday.   While in the ED, labs showed a WBC of 3.7, Hgb 13.6, and platelets 179. K+ 4.2. Creatinine 0.90. LFT's within normal limits. Initial troponin and cyclic values have been negative.  CXR with no active cardiopulmonary disease. EKG shows NSR, HR 65, with inferior q-waves present (slightly more prominent when compared to previous tracings).   Her last ischemic evaluation was a NST in 12/2013 which showed no evidence of ischemia. Last cardiac catheterization was in 2008 and showed known 2 vessel CAD with patent stents and the old inferior MI of the inferior wall was noted with moderate HK.  Past Medical History   Past  Medical History:  Diagnosis Date  . Chronic ischemic heart disease, unspecified    w/ stents in mid circumflex and mid RCA in 03, and balloon angioplasty of Diagonal branch 10-03, last cath Sept 08- 2 vessel disease w/ patent stents  . Esophageal reflux   . Hypothyroidism   . Immune thrombocytopenic purpura ([place])   . Lupus erythematosus   . Mitral valve insufficiency and aortic valve insufficiency   . Myocardial infarction ([place])    x 2  . Peripheral vascular disease, unspecified ([place])   . Unspecified disease of pericardium     Past Surgical History:  Procedure Laterality Date  . CHOLECYSTECTOMY, LAPAROSCOPIC  2010  . plastic surgical repair      dog bite to leg  . SPLENECTOMY  5-04     Allergies No Known Allergies  Inpatient Medications    . aspirin  81 mg Oral Daily  . azaTHIOprine  50 mg Oral BID  . enoxaparin (LOVENOX) injection  40 mg Subcutaneous QHS  . hydroxychloroquine  200 mg Oral Daily  . levothyroxine  50 mcg Oral QAC breakfast  . losartan  25 mg Oral Daily  . metoprolol succinate  25 mg Oral QHS  . pantoprazole  40 mg Oral Daily  . rosuvastatin  5 mg Oral QODAY    Family History    Family History  Problem Relation Age of Onset  . Cancer Mother     breast and cervical  . Paget's disease of bone Mother   . Fibromyalgia Mother   . Hypertension Father   . Heart disease Father  PVD  . GER disease Father   . Cancer Paternal Grandfather     colon  . Heart attack Other   . Coronary artery disease Other   . Diabetes Other   . Hypertension Other   . Hyperlipidemia Other     Social History    Social History   Social History  . Marital status: Single    Spouse name: N/A  . Number of children: N/A  . Years of education: N/A   Occupational History  . Not on file.   Social History Main Topics  . Smoking status: Current Some Day Smoker    Packs/day: 0.50    Years: 30.00    Types: Cigarettes    Last attempt to quit: 12/23/2011  . Smokeless  tobacco: Never Used  . Alcohol use No  . Drug use: No  . Sexual activity: No   Other Topics Concern  . Not on file   Social History Narrative   HSG,  graduated from [employer] in [place]. In college [name] -grad '13 with relgious studies. [place] [place] - Fall '13 for MA-divinity. Marrried '73 - 2 years/ divorced. 2 son- '73, '75 - CP.    Occupation: full-time Ship broker. She lives alone with her younger son living with her part-time but he resides in a managed care facility.      Review of Systems    General:  No chills, fever, night sweats or weight changes.  Cardiovascular:  No dyspnea on exertion, edema, orthopnea, palpitations, paroxysmal nocturnal dyspnea. Positive for chest pain.  Dermatological: No rash, lesions/masses Respiratory: No cough, dyspnea Urologic: No hematuria, dysuria Abdominal:   No diarrhea, bright red blood per rectum, melena, or hematemesis. Positive for nausea and vomiting.  Neurologic:  No visual changes, wkns, changes in mental status. Positive for dizziness.  All other systems reviewed and are otherwise negative except as noted above.  Physical Exam    Blood pressure 117/85, pulse 60, temperature 98.6 F (37 C), temperature source Oral, resp. rate 17, height 5\' 7"  (1.702 m), weight 243 lb 9.7 oz (110.5 kg), SpO2 97 %.  General: Pleasant, African American female appearing in NAD. Psych: Normal affect. Neuro: Alert and oriented X 3. Moves all extremities spontaneously. HEENT: Normal  Neck: Supple without bruits or JVD. Lungs:  Resp regular and unlabored, CTA without wheezing or rales. Heart: RRR no s3, s4, or murmurs. Abdomen: Soft, non-tender, non-distended, BS + x 4.  Extremities: No clubbing, cyanosis or edema. DP/PT/Radials 2+ and equal bilaterally.  Labs    Troponin [facility] of Care Test)  Recent Labs  11/18/15 2036  TROPIPOC 0.01    Recent Labs  11/18/15 2310 11/19/15 0239  TROPONINI <0.03 <0.03   Lab Results  Component Value  Date   WBC 3.7 (L) 11/18/2015   HGB 13.6 11/18/2015   HCT 40.1 11/18/2015   MCV 98.3 11/18/2015   PLT 179 11/18/2015    Recent Labs Lab 11/18/15 2036  NA 140  K 4.2  CL 107  CO2 28  BUN 16  CREATININE 0.90  CALCIUM 9.6  PROT 7.4  BILITOT 0.3  ALKPHOS 75  ALT 18  AST 28  GLUCOSE 100*   Lab Results  Component Value Date   CHOL 170 12/28/2014   HDL 50 12/28/2014   LDLCALC 108 12/28/2014   TRIG 60 12/28/2014   Lab Results  Component Value Date   DDIMER 0.67 (H) 12/16/2013     Radiology Studies    Dg Chest 2 View  Result Date: 11/18/2015 CLINICAL DATA:  Upper interscapular and right back pain for several days. Nausea and lightheadedness began 5 hours ago. EXAM: CHEST  2 VIEW COMPARISON:  03/03/2015 FINDINGS: The heart size and mediastinal contours are within normal limits. Both lungs are clear. The visualized skeletal structures are unremarkable. IMPRESSION: No active cardiopulmonary disease. Electronically Signed   By: Andreas Newport M.D.   On: 11/18/2015 21:18    EKG & Cardiac Imaging    EKG:  NSR, HR 65, with inferior q-waves present (slightly more prominent when compared to previous tracings)  Echocardiogram: 09/18/2014 Study Conclusions  - Left ventricle: The cavity size was normal. Wall thickness was   normal. Systolic function was mildly reduced. The estimated   ejection fraction was in the range of 45% to 50%. Akinesis and   scarring of the basal-midinferolateral myocardium. Doppler   parameters are consistent with abnormal left ventricular   relaxation (grade 1 diastolic dysfunction). - Aortic valve: There was mild regurgitation. - Mitral valve: There was moderate regurgitation directed   eccentrically and posteriorly. - Left atrium: The atrium was mildly dilated. - Atrial septum: No defect or patent foramen ovale was identified.   Cardiac Catheterization: 2008 ANGIOGRAPHIC RESULTS:  1. The patient's left main coronary artery was widely  patent.  2. The left anterior descending coronary artery coursed through the      cardiac apex giving rise to two diagonal branches; and at least two      small septal perforator branches.  No significant lesions were      noted in the LAD.  3. Circumflex coronary artery gave rise to two obtuse marginal      branches.  The first of which was long and fairly large in caliber.      The second was medium-to-large and trifurcated.  No lesions were      seen in either obtuse marginal branch.  The circumflex, itself, in      midportion of the vessel between OM #1 and OM #2 contained a radio-      opaque stent which was smooth and pristine in its appearance in two      views.  There were luminal irregularities in the distal circumflex      which did not appear to be significant.  4. The right coronary artery was dominant in terms of its having a      posterior descending branch.  The mid posterior descending branch      contained a 50% area of luminal irregularity which was comparable      to what she had the last time she had a catheterization which was      in 2005.  There was a radio-opaque stent in the midportion of the      of the RCA just beyond acute diagonal branch.  The acute diagonal      branch shows mild jailing of a very small acute marginal branch      which could be a culprit vessel in terms of her angina.  It is not      a vessel that could be intervened upon.   Left ventricular angiography shows good contractility of the anterior  wall and apex; and there is a large inferior basal myocardial infarction  that is old and unchanged from previous ventriculograms I would estimate  ejection fraction of be approximately 50%; and I would also describe 1+  mitral regurgitation.   FINAL IMPRESSIONS:  1. A 2-vessel coronary artery disease  of the circumflex and right      coronary artery.  2. Old inferior myocardial infarction seen on ventriculogram occupying      two-thirds of the  inferior wall of the left ventricle with moderate      hypokinesis.  3. No evidence of left ventricular thrombus.   PLAN:  The patient will be treated medically and released and reassured  about her situation which does not appear to be appreciably different  from her last cath from 3 years ago.  In a dictation on an [place]  status copies to Legrand Como nor ends MD copy to Bryson Dames, MD  copies to [facility], cath lab.  Gamble dictating thank you and   Assessment & Plan    1. Atypical Chest Pain - presented with centralized chest discomfort associated with nausea, vomiting, dizziness, and headaches. Similar to previous episodes of GERD and partially relieved with GI cocktail. Pain lasted for more than 5 hours then resolved spontaneously. Reports her pain felt different from previous MI's in 2003 when she had intense shoulder pain.  - denies any recent episodes of chest discomfort or worsening dyspnea with exertion. Does have mild dyspnea at times if she is being "overly active".  - cyclic troponin values have been negative and EKG shows NSR, HR 65, with inferior q-waves present (slightly more prominent when compared to previous tracings). Last ischemic evaluation was a NST in 12/2013 which showed no evidence of ischemia. - she denies any current chest discomfort at this time. 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 Can likely arrange for outpatient NST if she remains without chest discomfort, as she has already ruled out for an acute MI. Dr. [place] to see for final assessment.   2. CAD/Ischemic Cardiomyopathy - s/p BMS to RCA and LCx in 2003, PTCA to diagonal in 2003. Last echocardiogram in 08/2014 showed an EF 45-50%.  - last ischemic evaluation was a NST in 12/2013 which showed no evidence of ischemia. Cath in 2008 showed known 2 vessel CAD with patent stents and the old inferior MI of the inferior wall was noted with moderate HK. - continue ASA,  statin, BB, and ARB.   3. HLD - currently on Crestor 5mg  every other day. Followed by [facility].   Signed, Erma Heritage, PA-C 11/19/2015, 10:03 AM Pager: [phone] Patient seen and examined and history reviewed. Agree with above findings and plan. Pleasant 60 yo BF with prior cardiac history as noted above. Presents for evaluation of chest pain. Yesterday developed mid sternal chest pain with tightness. No dyspnea. Associated with Nausea and vomiting x 3. Pain lasted well over 5 hours. She did get relief with GI cocktail. Feels better today and is eating lunch.  On exam VSS. Lungs are clear. No gallop or murmur Cardiac enzymes are all normal. Ecg is without acute change.  Based on her history and lack of acute findings on cardiac testing I think her symptoms are GI related and she is at low risk from a cardiac standpoint. OK to DC today with usual cardiac meds. We will arrange outpatient stress Myoview and compare with prior study 2 years ago.  Sweetie Giebler [place], [place] 11/19/2015 11:53 AM

## 2015-11-19 NOTE — Discharge Instructions (Signed)
You have a Stress Test scheduled at [facility]. Your doctor has ordered this test to get a better idea of how your heart works. ° °Please arrive 15 minutes early for paperwork.  ° °Location: °[address], [address] °[place],  [address] °[phone] ° °Instructions: °· No food/drink after midnight the night before.  °· No caffeine/decaf products 24 hours before, including medicines such as Excedrin or Goody Powders. Call if there are any questions.  °· Wear comfortable clothes and shoes.  °· It is OK to take your morning meds with a sip of water EXCEPT for those types of medicines listed below or otherwise instructed. ° °Special Medication Instructions: °· Beta blockers such as metoprolol (Lopressor/Toprol XL), atenolol (Tenormin), carvedilol (Coreg), nebivolol (Bystolic), propranolol (Inderal) should not be taken for 24 hours before the test. °· Calcium channel blockers such as diltiazem (Cardizem) or verapmil (Calan) should not be taken for 24 hours before the test. °· Remove nitroglycerin patches and do not take nitrate preparations such as Imdur/isosorbide the day of your test. °· No Persantine/Theophylline or Aggrenox medicines should be used within 24 hours of the test.  ° °What To Expect: °The whole test will take several hours. When you arrive in the lab, the technician will inject a small amount of radioactive tracer into your arm through an IV while you are resting quietly. This helps us to form pictures of your heart. You will likely only feel a sting from the IV. After a waiting period, resting pictures will be obtained under a big camera. These are the "before" pictures. ° °Next, you will be prepped for the stress portion of the test. This may include either walking on a treadmill or receiving a medicine that helps to dilate blood vessels in your heart to simulate the effect of exercise on your heart. If you are walking on a treadmill, you will walk at different paces to  try to get your heart rate to a goal number that is based on your age. If your doctor has chosen the pharmacologic test, then you will receive a medicine through your IV that may cause temporary nausea, flushing, shortness of breath and sometimes chest discomfort or vomiting. This is typically short-lived and usually resolves quickly. Your blood pressure and heart rate will be monitored, and we will be watching your EKG on a computer screen for any changes. During this portion of the test, the radiologist will inject another small amount of radioactive tracer into your IV. After a waiting period, you will undergo a second set of pictures. These are the "after" pictures. ° °The doctor reading the test will compare the before-and-after images to look for evidence of heart blockages or heart weakness. In certain instances, this test is done over 2 days but usually only takes 1 day to complete. ° °

## 2015-11-19 NOTE — Discharge Summary (Signed)
Physician Discharge Summary  [name] [id number] DOB: March 19, 1956 DOA: 11/18/2015  PCP: Hoyt Koch, MD  Admit date: 11/18/2015 Discharge date: 11/19/2015  Recommendations for Outpatient Follow-up:  1. Pt will need to follow up with PCP in 2-3 weeks post discharge 2. Please obtain BMP to evaluate electrolytes and kidney function  Discharge Diagnoses:  Principal Problem:   Pain in the chest Active Problems:   Chronic ischemic heart disease  Discharge Condition: Stable  Diet recommendation: Heart healthy diet discussed in details   History of present illness:  60 y.o. female with past medical history of CAD (s/p BMS to RCA and LCx in 2003, PTCA to diagonal in 2003), ischemic cardiomyopathy (EF 45-50% by echo in 08/2014) moderate MR, HLD, GERD, hypothyroidism, SLE, and tobacco use who presented to [facility] ED on 11/18/2015 for evaluation of chest pain and dizziness, centralized chest discomfort which lasted for greater than 5 hours.  While in the ED, labs showed a WBC of 3.7, Hgb 13.6, and platelets 179. K+ 4.2. Creatinine 0.90. LFT's within normal limits. Initial troponin and cyclic values have been negative.  CXR with no active cardiopulmonary disease. EKG shows NSR, HR 65, with inferior q-waves present (slightly more prominent when compared to previous tracings).   Hospital Course:   1. Atypical Chest Pain - cyclic troponin values have been negative and EKG shows NSR, HR 65, with inferior q-waves present  - no chest pain this AM - wants to go home - cardiology team seen and cleared for d/c with close outpatient follow up  2. CAD/Ischemic Cardiomyopathy - s/p BMS to RCA and LCx in 2003, PTCA to diagonal in 2003. Last echocardiogram in 08/2014 showed an EF 45-50%.  - continue ASA, statin, BB, and ARB.   3. HLD - currently on Crestor 5mg  every other day.   Procedures/Studies: Dg Chest 2 View  Result Date: 11/18/2015 CLINICAL DATA:  Upper  interscapular and right back pain for several days. Nausea and lightheadedness began 5 hours ago. EXAM: CHEST  2 VIEW COMPARISON:  03/03/2015 FINDINGS: The heart size and mediastinal contours are within normal limits. Both lungs are clear. The visualized skeletal structures are unremarkable. IMPRESSION: No active cardiopulmonary disease. Electronically Signed   By: Andreas Newport M.D.   On: 11/18/2015 21:18    Discharge Exam: Vitals:   11/19/15 0217 11/19/15 0550  BP: 102/77 117/85  Pulse: 65 60  Resp: 16 17  Temp: 98.3 F (36.8 C) 98.6 F (37 C)   Vitals:   11/18/15 2245 11/18/15 2326 11/19/15 0217 11/19/15 0550  BP:  (!) 148/90 102/77 117/85  Pulse: (!) 59 (!) 59 65 60  Resp: 20 16 16 17   Temp:  98.5 F (36.9 C) 98.3 F (36.8 C) 98.6 F (37 C)  TempSrc:  Oral Oral Oral  SpO2: 94% 97% 95% 97%  Weight:  110.5 kg (243 lb 9.7 oz)    Height:  5\' 7"  (1.702 m)      General: Pt is alert, follows commands appropriately, not in acute distress Cardiovascular: Regular rate and rhythm, no rubs, no gallops Respiratory: Clear to auscultation bilaterally, no wheezing, no crackles, no rhonchi Abdominal: Soft, non tender, non distended, bowel sounds +, no guarding Extremities: no edema, no cyanosis, pulses palpable bilaterally DP and PT Neuro: Grossly nonfocal  Discharge Instructions  Discharge Instructions    Diet - low sodium heart healthy    Complete by:  As directed   Increase activity slowly    Complete by:  As directed       Medication List    TAKE these medications   albuterol 108 (90 Base) MCG/ACT inhaler Commonly known as:  PROVENTIL HFA;VENTOLIN HFA Inhale 2 puffs into the lungs every 6 (six) hours as needed for wheezing or shortness of breath.   aspirin 81 MG chewable tablet Chew 81 mg by mouth daily.   azaTHIOprine 50 MG tablet Commonly known as:  IMURAN Take 50 mg by mouth 2 (two) times daily.   azithromycin 250 MG tablet Commonly known as:  ZITHROMAX Take  250-500 mg by mouth daily. Pt is to take two tablets on day 1 and one tablet daily for the next four days.   fluticasone 50 MCG/ACT nasal spray Commonly known as:  FLONASE Place 2 sprays into both nostrils daily as needed for rhinitis.   furosemide 40 MG tablet Commonly known as:  LASIX Take 40 mg by mouth daily as needed for edema.   HYDROcodone-homatropine 5-1.5 MG/5ML syrup Commonly known as:  HYCODAN Take 5 mLs by mouth at bedtime as needed for cough.   hydroxychloroquine 200 MG tablet Commonly known as:  PLAQUENIL Take 200 mg by mouth daily.   levothyroxine 50 MCG tablet Commonly known as:  SYNTHROID, LEVOTHROID Take 50 mcg by mouth daily before breakfast.   losartan 25 MG tablet Commonly known as:  COZAAR Take 25 mg by mouth daily.   metoprolol succinate 25 MG 24 hr tablet Commonly known as:  TOPROL-XL Take 25 mg by mouth at bedtime.   nitroGLYCERIN 0.4 MG SL tablet Commonly known as:  NITROSTAT Place 0.4 mg under the tongue every 5 (five) minutes x 3 doses as needed for chest pain.   pantoprazole 40 MG tablet Commonly known as:  PROTONIX Take 1 tablet (40 mg total) by mouth daily.   rosuvastatin 5 MG tablet Commonly known as:  CRESTOR Take 5 mg by mouth every other day.      Follow-up Information    Lauree Chandler, MD .   Specialty:  Cardiology Why:  The office will call you for a date/time to arrange your stress test. If you do not hear from them in 3 business days, please call the number provided. Nothing to eat or drink after midnight leading up to the test. Contact information: [place]. [address] [place] [address] [phone]        Hoyt Koch, MD .   Specialty:  Internal Medicine Contact information: [name] [id number] [phone]            The results of significant diagnostics from this hospitalization (including imaging, microbiology, ancillary and laboratory) are listed below for  reference.     Microbiology: No results found for this or any previous visit (from the past 240 hour(s)).   Labs: Basic Metabolic Panel:  Recent Labs Lab 11/18/15 2036  NA 140  K 4.2  CL 107  CO2 28  GLUCOSE 100*  BUN 16  CREATININE 0.90  CALCIUM 9.6   Liver Function Tests:  Recent Labs Lab 11/18/15 2036  AST 28  ALT 18  ALKPHOS 75  BILITOT 0.3  PROT 7.4  ALBUMIN 3.9    Recent Labs Lab 11/18/15 2036  LIPASE 18   No results for input(s): AMMONIA in the last 168 hours. CBC:  Recent Labs Lab 11/18/15 2036  WBC 3.7*  HGB 13.6  HCT 40.1  MCV 98.3  PLT 179   Cardiac Enzymes:  Recent Labs Lab 11/18/15 2310  11/19/15 0239  TROPONINI <0.03 <0.03    CBG:  Recent Labs Lab 11/19/15 0733  GLUCAP 98    SIGNED: Time coordinating discharge: 30 minutes  Faye Ramsay, MD  [facility] 11/19/2015, 1:07 PM Pager [phone]  If 7PM-7AM, please contact night-coverage [email or web address] Password [email or web address]

## 2015-11-22 DIAGNOSIS — M329 Systemic lupus erythematosus, unspecified: Secondary | ICD-10-CM | POA: Diagnosis not present

## 2015-11-22 DIAGNOSIS — Z79899 Other long term (current) drug therapy: Secondary | ICD-10-CM | POA: Diagnosis not present

## 2015-11-22 DIAGNOSIS — M3214 Glomerular disease in systemic lupus erythematosus: Secondary | ICD-10-CM | POA: Diagnosis not present

## 2016-01-11 ENCOUNTER — Other Ambulatory Visit: Payer: Self-pay | Admitting: Internal Medicine

## 2016-01-29 ENCOUNTER — Telehealth: Payer: Self-pay | Admitting: *Deleted

## 2016-01-29 NOTE — Telephone Encounter (Signed)
I have left this patient a message on 9/7 and 01/24/16,still no response.

## 2016-01-31 NOTE — Telephone Encounter (Signed)
Patient saw charlotte on 8/23, and flonase was suggested at that visit, but I dont think we ordered, but i'm getting refill request---do you think patient needs refill or would you rather I route to charlotte to advise----routing to dr crawford, please advise, thanks

## 2016-03-08 ENCOUNTER — Encounter (HOSPITAL_COMMUNITY): Payer: Self-pay | Admitting: Emergency Medicine

## 2016-03-08 ENCOUNTER — Emergency Department (HOSPITAL_COMMUNITY)
Admission: EM | Admit: 2016-03-08 | Discharge: 2016-03-09 | Disposition: A | Discharge status: Home / Self Care | Payer: PRIVATE HEALTH INSURANCE | Attending: Emergency Medicine | Admitting: Emergency Medicine

## 2016-03-08 ENCOUNTER — Emergency Department (HOSPITAL_COMMUNITY): Payer: PRIVATE HEALTH INSURANCE

## 2016-03-08 DIAGNOSIS — Z7982 Long term (current) use of aspirin: Secondary | ICD-10-CM | POA: Insufficient documentation

## 2016-03-08 DIAGNOSIS — I252 Old myocardial infarction: Secondary | ICD-10-CM | POA: Diagnosis not present

## 2016-03-08 DIAGNOSIS — N12 Tubulo-interstitial nephritis, not specified as acute or chronic: Secondary | ICD-10-CM | POA: Insufficient documentation

## 2016-03-08 DIAGNOSIS — F17210 Nicotine dependence, cigarettes, uncomplicated: Secondary | ICD-10-CM | POA: Insufficient documentation

## 2016-03-08 DIAGNOSIS — E039 Hypothyroidism, unspecified: Secondary | ICD-10-CM | POA: Insufficient documentation

## 2016-03-08 DIAGNOSIS — R1030 Lower abdominal pain, unspecified: Secondary | ICD-10-CM | POA: Diagnosis present

## 2016-03-08 MED ORDER — ONDANSETRON HCL 4 MG/2ML IJ SOLN
4.0000 mg | Freq: Once | INTRAMUSCULAR | Status: AC
  Administered 2016-03-09: 4 mg via INTRAVENOUS
  Filled 2016-03-08: qty 2

## 2016-03-08 MED ORDER — FENTANYL CITRATE (PF) 100 MCG/2ML IJ SOLN
50.0000 ug | Freq: Once | INTRAMUSCULAR | Status: AC
  Administered 2016-03-09: 50 ug via INTRAVENOUS
  Filled 2016-03-08: qty 2

## 2016-03-08 NOTE — ED Provider Notes (Signed)
[facility] DEPT Provider Note   CSN: [id number] Arrival date & time: 03/08/16  2303  By signing my name below, I, Macon Large, attest that this documentation has been prepared under the direction and in the presence of Ripley Fraise, MD. Electronically Signed: Macon Large, ED Scribe. 03/08/16. 11:47 PM.  History   Chief Complaint Chief Complaint  Patient presents with  . Abdominal Pain  . Emesis  . Chest Pain   The history is provided by the patient. No language interpreter was used.  Emesis   Associated symptoms include abdominal pain. Pertinent negatives include no fever.  Chest Pain   Associated symptoms include abdominal pain, back pain and vomiting. Pertinent negatives include no fever and no weakness.   HPI Comments: [name] is a 60 y.o. female with hx of esophageal reflux, lupus and chronic ischemic heart disease, who presents to the Emergency Department complaining of moderate, constant, lower abdominal pain onset today. Pt describes her abdominal pain as a burning sensation. She notes her pain radiates to her lower left back. Pt reports associated vomiting. She states she has been constantly vomiting for about three hours. Pt notes her abdominal pain is alleviated when she vomits. Pt notes having hx of splenectomy and gallbladder removal. She also notes hx of lupus. She denies tingling and weakness, fever. No additional complaints at this time.   Past Medical History:  Diagnosis Date  . Chronic ischemic heart disease, unspecified    w/ stents in mid circumflex and mid RCA in 03, and balloon angioplasty of Diagonal branch 10-03, last cath Sept 08- 2 vessel disease w/ patent stents  . Esophageal reflux   . Hypothyroidism   . Immune thrombocytopenic purpura ([place])   . Lupus erythematosus   . Mitral valve insufficiency and aortic valve insufficiency   . Myocardial infarction    x 2  . Peripheral vascular disease, unspecified   . Unspecified disease of  pericardium     Patient Active Problem List   Diagnosis Date Noted  . Pain in the chest 11/18/2015  . Immunocompromised patient ([place]) 11/14/2015  . CAP (community acquired pneumonia) 03/12/2015  . Acute sinusitis 08/23/2014  . Health care maintenance 04/26/2011  . Hypothyroidism 04/08/2011  . Atherosclerosis of native coronary artery of native heart without angina pectoris 02/26/2009  . Mitral valve disorders(424.0) 02/26/2009  . Hyperlipidemia 01/26/2009  . Cough 12/09/2007  . Immune thrombocytopenic purpura ([place]) 08/05/2007  . Chronic ischemic heart disease 08/05/2007  . PVD 08/05/2007  . DVT 08/05/2007  . GERD 08/05/2007  . Systemic lupus erythematosus with focal and segmental proliferative glomerulonephritis ([place]) 08/05/2007    Past Surgical History:  Procedure Laterality Date  . CHOLECYSTECTOMY, LAPAROSCOPIC  2010  . plastic surgical repair      dog bite to leg  . SPLENECTOMY  5-04    OB History    No data available       Home Medications    Prior to Admission medications   Medication Sig Start Date End Date Taking? Authorizing Provider  albuterol (PROVENTIL HFA;VENTOLIN HFA) 108 (90 Base) MCG/ACT inhaler Inhale 2 puffs into the lungs every 6 (six) hours as needed for wheezing or shortness of breath. 11/14/15   Flossie Buffy, NP  aspirin 81 MG chewable tablet Chew 81 mg by mouth daily.     Historical Provider, MD  azaTHIOprine (IMURAN) 50 MG tablet Take 50 mg by mouth 2 (two) times daily.    Historical Provider, MD  fluticasone (FLONASE) 50 MCG/ACT nasal  spray SPRAY TWICE IN EACH NOSTRIL ONCE DAILY 01/31/16   Hoyt Koch, MD  fluticasone [facility]) 50 MCG/ACT nasal spray Place 2 sprays into both nostrils daily as needed for rhinitis.    Historical Provider, MD  furosemide (LASIX) 40 MG tablet Take 40 mg by mouth daily as needed for edema.     Neena Rhymes, MD  HYDROcodone-homatropine [facility]) 5-1.5 MG/5ML syrup Take 5 mLs by mouth at bedtime as  needed for cough. 11/14/15   Flossie Buffy, NP  hydroxychloroquine (PLAQUENIL) 200 MG tablet Take 200 mg by mouth daily.     Historical Provider, MD  levothyroxine (SYNTHROID, LEVOTHROID) 50 MCG tablet Take 50 mcg by mouth daily before breakfast.    Historical Provider, MD  levothyroxine (SYNTHROID, LEVOTHROID) 50 MCG tablet TAKE 1 TABLET BY MOUTH EVERY DAY 01/11/16   Hoyt Koch, MD  losartan (COZAAR) 25 MG tablet Take 25 mg by mouth daily.    Historical Provider, MD  metoprolol succinate (TOPROL-XL) 25 MG 24 hr tablet Take 25 mg by mouth at bedtime.    Historical Provider, MD  nitroGLYCERIN (NITROSTAT) 0.4 MG SL tablet Place 0.4 mg under the tongue every 5 (five) minutes x 3 doses as needed for chest pain.     Historical Provider, MD  pantoprazole (PROTONIX) 40 MG tablet Take 1 tablet (40 mg total) by mouth daily. 08/03/15   Burnell Blanks, MD  rosuvastatin (CRESTOR) 5 MG tablet Take 5 mg by mouth every other day.    Historical Provider, MD    Family History Family History  Problem Relation Age of Onset  . Cancer Mother     breast and cervical  . Paget's disease of bone Mother   . Fibromyalgia Mother   . Hypertension Father   . Heart disease Father     PVD  . GER disease Father   . Cancer Paternal Grandfather     colon  . Heart attack Other   . Coronary artery disease Other   . Diabetes Other   . Hypertension Other   . Hyperlipidemia Other     Social History Social History  Substance Use Topics  . Smoking status: Current Some Day Smoker    Packs/day: 0.50    Years: 30.00    Types: Cigarettes    Last attempt to quit: 12/23/2011  . Smokeless tobacco: Never Used  . Alcohol use No     Allergies   Patient has no known allergies.   Review of Systems Review of Systems  Constitutional: Negative for fever.  Cardiovascular: Positive for chest pain.  Gastrointestinal: Positive for abdominal pain and vomiting.  Musculoskeletal: Positive for back pain.    Neurological: Negative for weakness.  All other systems reviewed and are negative.    Physical Exam Updated Vital Signs BP 172/90 (BP Location: Left Arm)   Pulse 70   Temp 97.6 F (36.4 C) (Oral)   Resp 16   SpO2 100%   Physical Exam  CONSTITUTIONAL: Well developed/well nourished. uncomfortable appearing. HEAD: Normocephalic/atraumatic EYES: EOMI/PERRL ENMT: Mucous membranes moist NECK: supple no meningeal signs SPINE/BACK:entire spine nontender, lumbar paraspinal tenderness CV: S1/S2 noted, no murmurs/rubs/gallops noted LUNGS: Lungs are clear to auscultation bilaterally, no apparent distress ABDOMEN: soft, mild diffused tenderness, no rebound or guarding, bowel sounds noted throughout abdomen [id number] cva tenderness, no bruising or erythema to flank NEURO: Pt is awake/alert/appropriate, moves all extremitiesx4.  No facial droop.   EXTREMITIES: pulses normal/equal, full ROM SKIN: warm, color normal PSYCH: no abnormalities  of mood noted, alert and oriented to situation  ED Treatments / Results   DIAGNOSTIC STUDIES: Oxygen Saturation is 100% on RA, normal by my interpretation.    COORDINATION OF CARE: 11:43 PM Discussed treatment plan with pt at bedside which includes pain and nausea medication, labs, chest XR and EKG and pt agreed to plan.  Labs (all labs ordered are listed, but only abnormal results are displayed) Labs Reviewed  BASIC METABOLIC PANEL - Abnormal; Notable for the following:       Result Value   Glucose, Bld 109 (*)    Creatinine, Ser 1.13 (*)    GFR calc non Af Amer 52 (*)    GFR calc Af Amer 60 (*)    All other components within normal limits  CBC - Abnormal; Notable for the following:    RBC 3.70 (*)    MCH 35.1 (*)    All other components within normal limits  HEPATIC FUNCTION PANEL - Abnormal; Notable for the following:    ALT 13 (*)    Bilirubin, Direct <0.1 (*)    All other components within normal limits  URINALYSIS, ROUTINE W REFLEX  MICROSCOPIC - Abnormal; Notable for the following:    APPearance HAZY (*)    Hgb urine dipstick LARGE (*)    Protein, ur 30 (*)    Squamous Epithelial / LPF 0-5 (*)    All other components within normal limits  URINE CULTURE  LIPASE, BLOOD  I-STAT TROPOININ, ED    EKG  EKG Interpretation  Date/Time:  Saturday March 08 2016 23:03:48 EST Ventricular Rate:  74 PR Interval:    QRS Duration: 84 QT Interval:  416 QTC Calculation: 462 R Axis:   43 Text Interpretation:  Sinus rhythm Non-specific ST-t changes No significant change since last tracing Confirmed by Christy Gentles  MD, Dariel Pellecchia ([id number]) on 03/08/2016 11:17:49 PM       Radiology Dg Chest 2 View  Result Date: 03/09/2016 CLINICAL DATA:  60 year old female with chest pain, lower back pain, and nausea vomiting. EXAM: CHEST  2 VIEW COMPARISON:  Chest radiograph dated 11/18/2015 FINDINGS: The lungs are clear. There is no pleural effusion or pneumothorax. The cardiac silhouette is within normal limits. The aorta is tortuous. No acute osseous pathology. IMPRESSION: No active cardiopulmonary disease. Electronically Signed   By: Anner Crete M.D.   On: 03/09/2016 00:35   Ct Renal Stone Study  Result Date: 03/09/2016 CLINICAL DATA:  LEFT low back pain, nausea and vomiting today. History of lupus, splenectomy and cholecystectomy. EXAM: CT ABDOMEN AND PELVIS WITHOUT CONTRAST TECHNIQUE: Multidetector CT imaging of the abdomen and pelvis was performed following the standard protocol without IV contrast. COMPARISON:  CT abdomen and pelvis March 04, 2012 FINDINGS: LOWER CHEST: Lung bases are clear. Included heart size is mildly enlarged. At least mild coronary artery calcifications, incompletely imaged. No pericardial effusion. HEPATOBILIARY: Liver is normal periods status post cholecystectomy. PANCREAS: Normal. SPLEEN: Status post splenectomy with small splenule in the surgical bed. ADRENALS/URINARY TRACT: Kidneys are orthotopic, demonstrating  normal size. LEFT interpolar cortical scarring. Increased LEFT perinephric fat stranding and small amount of intermediate density free fluid, to lesser extent on the RIGHT. No nephrolithiasis, hydronephrosis; limited assessment for renal masses on this nonenhanced examination. The unopacified ureters are normal in course and caliber. Urinary bladder is partially distended and unremarkable. Normal adrenal glands. STOMACH/BOWEL: Small hiatal hernia. The stomach, small and large bowel are normal in course and caliber without inflammatory changes, sensitivity decreased by lack of enteric  contrast. Mild sigmoid diverticulosis. Normal appendix. VASCULAR/LYMPHATIC: Aortoiliac vessels are normal in course and caliber, moderate calcific atherosclerosis. No lymphadenopathy by CT size criteria. REPRODUCTIVE: Normal. OTHER: No intraperitoneal free fluid or free air. MUSCULOSKELETAL: Non-acute. Small fat containing LEFT groin hernia. Irregular sclerosis bilateral femoral heads compatible with avascular necrosis without collapse. No degenerative change of lumbar spine for age. IMPRESSION: Increasing LEFT perinephric fat stranding and an small amount of free fluid, possible hemorrhage. No urolithiasis or obstructive uropathy. Status post splenectomy and cholecystectomy. Electronically Signed   By: Elon Alas M.D.   On: 03/09/2016 04:17    Procedures Procedures (including critical care time)  Medications Ordered in ED Medications  ondansetron (ZOFRAN) injection 4 mg (4 mg Intravenous Given 03/09/16 0016)  fentaNYL (SUBLIMAZE) injection 50 mcg (50 mcg Intravenous Given 03/09/16 0018)  cefTRIAXone (ROCEPHIN) 1 g in dextrose 5 % 50 mL IVPB (0 g Intravenous Stopped 03/09/16 0353)  fentaNYL (SUBLIMAZE) injection 50 mcg (50 mcg Intravenous Given 03/09/16 0321)     Initial Impression / Assessment and Plan / ED Course  I have reviewed the triage vital signs and the nursing notes.  Pertinent labs & imaging results  that were available during my care of the patient were reviewed by me and considered in my medical decision making (see chart for details).  Clinical Course     1:06 AM Pt feels improved She still reports lower abdominal pain with radiation into back Will obtain Urine and may need CT imaging 3:30 AM Pt with continued abd pain and also reporting flank pain She has evidence of hematuria and pyuria Will obtain CT renal study No other CP reported, will defer further CP workup at this time as troponin negative 5:08 AM BP 137/81   Pulse 76   Temp 97.6 F (36.4 C) (Oral)   Resp 11   SpO2 97%  Pt improved She is not septic appearing suspecet pyelo (discussed CT results with dr bloomer,rads, limited due to NO IV contrast, but could be pyelo) Pt reported recent falls but no bruising to back Given history/exam, suspect this may be pyelonephritis I offered admission pt prefers to go home (pt high risk with h/o splenectomy and also lupus) She was given rocephin Urine culture sent Will d/c home on levaquin We discussed strict return precautons Final Clinical Impressions(s) / ED Diagnoses   Final diagnoses:  Pyelonephritis    New Prescriptions New Prescriptions   LEVOFLOXACIN (LEVAQUIN) 750 MG TABLET    Take 1 tablet (750 mg total) by mouth daily.    I personally performed the services described in this documentation, which was scribed in my presence. The recorded information has been reviewed and is accurate.        Ripley Fraise, MD 03/09/16 [phone]

## 2016-03-08 NOTE — ED Triage Notes (Signed)
Pt from home with complaints of 2 MIs in 2013. Pt stated that tonight around 1800 she began experiencing abdominal pain that suddenly got worse. Pt also has complaints of chest tightness and back pain. Pt has experienced multiple episodes of emesis and tachypnea during triage.

## 2016-03-09 ENCOUNTER — Emergency Department (HOSPITAL_COMMUNITY): Payer: PRIVATE HEALTH INSURANCE

## 2016-03-09 DIAGNOSIS — N12 Tubulo-interstitial nephritis, not specified as acute or chronic: Secondary | ICD-10-CM | POA: Diagnosis not present

## 2016-03-09 LAB — LIPASE, BLOOD: Lipase: 20 U/L (ref 11–51)

## 2016-03-09 LAB — URINALYSIS, ROUTINE W REFLEX MICROSCOPIC
Bacteria, UA: NONE SEEN
Bilirubin Urine: NEGATIVE
Glucose, UA: NEGATIVE mg/dL
Ketones, ur: NEGATIVE mg/dL
Leukocytes, UA: NEGATIVE
Nitrite: NEGATIVE
Protein, ur: 30 mg/dL — AB
Specific Gravity, Urine: 1.019 (ref 1.005–1.030)
pH: 7 (ref 5.0–8.0)

## 2016-03-09 LAB — BASIC METABOLIC PANEL
Anion gap: 8 (ref 5–15)
BUN: 18 mg/dL (ref 6–20)
CO2: 25 mmol/L (ref 22–32)
Calcium: 8.9 mg/dL (ref 8.9–10.3)
Chloride: 107 mmol/L (ref 101–111)
Creatinine, Ser: 1.13 mg/dL — ABNORMAL HIGH (ref 0.44–1.00)
GFR calc Af Amer: 60 mL/min — ABNORMAL LOW (ref >60)
GFR calc non Af Amer: 52 mL/min — ABNORMAL LOW (ref >60)
Glucose, Bld: 109 mg/dL — ABNORMAL HIGH (ref 65–99)
Potassium: 3.7 mmol/L (ref 3.5–5.1)
Sodium: 140 mmol/L (ref 135–145)

## 2016-03-09 LAB — CBC
HCT: 36.3 % (ref 36.0–46.0)
Hemoglobin: 13 g/dL (ref 12.0–15.0)
MCH: 35.1 pg — ABNORMAL HIGH (ref 26.0–34.0)
MCHC: 35.8 g/dL (ref 30.0–36.0)
MCV: 98.1 fL (ref 78.0–100.0)
Platelets: 198 10*3/uL (ref 150–400)
RBC: 3.7 MIL/uL — ABNORMAL LOW (ref 3.87–5.11)
RDW: 14.5 % (ref 11.5–15.5)
WBC: 6.1 10*3/uL (ref 4.0–10.5)

## 2016-03-09 LAB — HEPATIC FUNCTION PANEL
ALT: 13 U/L — ABNORMAL LOW (ref 14–54)
AST: 23 U/L (ref 15–41)
Albumin: 3.8 g/dL (ref 3.5–5.0)
Alkaline Phosphatase: 63 U/L (ref 38–126)
Bilirubin, Direct: <0.1 mg/dL — ABNORMAL LOW (ref 0.1–0.5)
Total Bilirubin: 0.5 mg/dL (ref 0.3–1.2)
Total Protein: 6.9 g/dL (ref 6.5–8.1)

## 2016-03-09 LAB — I-STAT TROPONIN, ED: Troponin i, poc: 0 ng/mL (ref 0.00–0.08)

## 2016-03-09 MED ORDER — CEFTRIAXONE SODIUM 1 G IJ SOLR
1.0000 g | Freq: Once | INTRAMUSCULAR | Status: AC
  Administered 2016-03-09: 1 g via INTRAVENOUS
  Filled 2016-03-09: qty 10

## 2016-03-09 MED ORDER — FENTANYL CITRATE (PF) 100 MCG/2ML IJ SOLN
50.0000 ug | Freq: Once | INTRAMUSCULAR | Status: AC
  Administered 2016-03-09: 50 ug via INTRAVENOUS
  Filled 2016-03-09: qty 2

## 2016-03-09 MED ORDER — LEVOFLOXACIN 750 MG PO TABS: 750.0000 mg | ORAL_TABLET | Freq: Every day | ORAL | 0 refills | Status: DC

## 2016-03-09 NOTE — ED Notes (Signed)
Family/ Friend at bedside.

## 2016-03-10 LAB — URINE CULTURE

## 2016-03-14 ENCOUNTER — Ambulatory Visit (INDEPENDENT_AMBULATORY_CARE_PROVIDER_SITE_OTHER): Payer: PRIVATE HEALTH INSURANCE | Admitting: Internal Medicine

## 2016-03-14 ENCOUNTER — Encounter: Payer: Self-pay | Admitting: Internal Medicine

## 2016-03-14 VITALS — BP 140/80 | HR 89 | Resp 20 | Wt 253.0 lb

## 2016-03-14 DIAGNOSIS — M3214 Glomerular disease in systemic lupus erythematosus: Secondary | ICD-10-CM | POA: Diagnosis not present

## 2016-03-14 DIAGNOSIS — N390 Urinary tract infection, site not specified: Secondary | ICD-10-CM | POA: Diagnosis not present

## 2016-03-14 DIAGNOSIS — R300 Dysuria: Secondary | ICD-10-CM | POA: Diagnosis not present

## 2016-03-14 DIAGNOSIS — I255 Ischemic cardiomyopathy: Secondary | ICD-10-CM

## 2016-03-14 LAB — POCT URINALYSIS DIPSTICK
Bilirubin, UA: NEGATIVE
Blood, UA: NEGATIVE
Glucose, UA: NEGATIVE
Ketones, UA: NEGATIVE
Leukocytes, UA: NEGATIVE
Nitrite, UA: NEGATIVE
Protein, UA: NEGATIVE
Spec Grav, UA: 1.025
Urobilinogen, UA: NEGATIVE
pH, UA: 6

## 2016-03-14 MED ORDER — LEVOFLOXACIN 500 MG PO TABS: 500.0000 mg | ORAL_TABLET | Freq: Every day | ORAL | 0 refills | Status: AC

## 2016-03-14 MED ORDER — PREDNISONE 10 MG PO TABS: ORAL_TABLET | ORAL | 0 refills | Status: DC

## 2016-03-14 NOTE — Assessment & Plan Note (Signed)
Urine cx c/w contamination and not helpful, symptoms much improved but for extended levaquin at 500 qd x 5 days,  to f/u any worsening symptoms or concerns

## 2016-03-14 NOTE — Patient Instructions (Signed)
Please take all new medication as prescribed - the antibiotic  Please continue all other medications as before, and refills have been done if requested. - prednisone  Please have the pharmacy call with any other refills you may need.  Please keep your appointments with your specialists as you may have planned

## 2016-03-14 NOTE — Progress Notes (Signed)
Pre visit review using our clinic review tool, if applicable. No additional management support is needed unless otherwise documented below in the visit note. 

## 2016-03-14 NOTE — Assessment & Plan Note (Addendum)
With refill prednisone as above,  to f/u rheumatology as planned and any worsening symptoms or concerns

## 2016-03-14 NOTE — Progress Notes (Signed)
Subjective:    Patient ID: [name], female    DOB: 1955/07/06, 60 y.o.   MRN: [id number]  HPI  Here to f/u, seen at ED dec 22 with abnormal UA, severe dysuria and illness, tx with levaquin 750 qd which caused some nausea but still able to keep down.  Symptoms overall much improved.  Denies urinary symptoms such as frequency, urgency, flank pain, hematuria or n/v, fever, chills, but still has some mild dysuria.  Pt denies chest pain, increased sob or doe, wheezing, orthopnea, PND, increased LE swelling, palpitations, dizziness or syncope.  Pt denies new neurological symptoms such as new headache, or facial or extremity weakness or numbness   Pt denies polydipsia, polyuria, Does have recurrent joint symptoms related to SLE, and asks for refill of low dose prednisone she has had at home for prn use. Past Medical History:  Diagnosis Date  . Chronic ischemic heart disease, unspecified    w/ stents in mid circumflex and mid RCA in 03, and balloon angioplasty of Diagonal branch 10-03, last cath Sept 08- 2 vessel disease w/ patent stents  . Esophageal reflux   . Hypothyroidism   . Immune thrombocytopenic purpura ([place])   . Lupus erythematosus   . Mitral valve insufficiency and aortic valve insufficiency   . Myocardial infarction    x 2  . Peripheral vascular disease, unspecified   . Unspecified disease of pericardium    Past Surgical History:  Procedure Laterality Date  . CHOLECYSTECTOMY, LAPAROSCOPIC  2010  . plastic surgical repair      dog bite to leg  . SPLENECTOMY  5-04    reports that she has been smoking Cigarettes.  She has a 15.00 pack-year smoking history. She has never used smokeless tobacco. She reports that she does not drink alcohol or use drugs. family history includes Cancer in her mother and paternal grandfather; Coronary artery disease in her other; Diabetes in her other; Fibromyalgia in her mother; GER disease in her father; Heart attack in her other; Heart disease in  her father; Hyperlipidemia in her other; Hypertension in her father and other; Paget's disease of bone in her mother. No Known Allergies Current Outpatient Prescriptions on File Prior to Visit  Medication Sig Dispense Refill  . albuterol (PROVENTIL HFA;VENTOLIN HFA) 108 (90 Base) MCG/ACT inhaler Inhale 2 puffs into the lungs every 6 (six) hours as needed for wheezing or shortness of breath. 1 Inhaler 0  . aspirin 81 MG chewable tablet Chew 81 mg by mouth daily.     Marland Kitchen atenolol (TENORMIN) 25 MG tablet Take 25 mg by mouth daily.    Marland Kitchen azaTHIOprine (IMURAN) 50 MG tablet Take 50 mg by mouth 2 (two) times daily.    . fluticasone (FLONASE) 50 MCG/ACT nasal spray SPRAY TWICE IN EACH NOSTRIL ONCE DAILY 48 g 1  . furosemide (LASIX) 40 MG tablet Take 40 mg by mouth daily as needed for edema.     . hydroxychloroquine (PLAQUENIL) 200 MG tablet Take 200 mg by mouth daily.     Marland Kitchen levothyroxine (SYNTHROID, LEVOTHROID) 50 MCG tablet TAKE 1 TABLET BY MOUTH EVERY DAY (Patient taking differently: TAKE 50 MCG BY MOUTH EVERY DAY) 90 tablet 0  . losartan (COZAAR) 25 MG tablet Take 25 mg by mouth daily.    . nitroGLYCERIN (NITROSTAT) 0.4 MG SL tablet Place 0.4 mg under the tongue every 5 (five) minutes x 3 doses as needed for chest pain.     . pantoprazole (PROTONIX) 40 MG tablet  Take 1 tablet (40 mg total) by mouth daily. 90 tablet 3  . rosuvastatin (CRESTOR) 5 MG tablet Take 5 mg by mouth every other day.     No current facility-administered medications on file prior to visit.    Review of Systems  Constitutional: Negative for unusual diaphoresis or night sweats HENT: Negative for ear swelling or discharge Eyes: Negative for worsening visual haziness  Respiratory: Negative for choking and stridor.   Gastrointestinal: Negative for distension or worsening eructation Genitourinary: Negative for retention or change in urine volume.  Musculoskeletal: Negative for other MSK pain or swelling Skin: Negative for color  change and worsening wound Neurological: Negative for tremors and numbness other than noted  Psychiatric/Behavioral: Negative for decreased concentration or agitation other than above   All other system neg per pt    Objective:   Physical Exam BP 140/80   Pulse 89   Resp 20   Wt 253 lb (114.8 kg)   SpO2 98%   BMI 39.63 kg/m  VS noted, non toxic Constitutional: Pt appears in no apparent distress HENT: Head: NCAT.  Right Ear: External ear normal.  Left Ear: External ear normal.  Eyes: . Pupils are equal, round, and reactive to light. Conjunctivae and EOM are normal Neck: Normal range of motion. Neck supple.  Cardiovascular: Normal rate and regular rhythm.   Pulmonary/Chest: Effort normal and breath sounds without rales or wheezing.  Abd:  Soft, NT, ND, + BS Neurological: Pt is alert. Not confused , motor grossly intact Skin: Skin is warm. No rash, no LE edema Psychiatric: Pt behavior is normal. No agitation.   POCT urinalysis dipstick  Order: [id number]  Status:  Final result Visible to patient:  No (Not Released) Dx:  Dysuria    Ref Range & Units 14:56 5d ago 8yr ago   Color, UA  yellow      Clarity, UA  cloudy      Glucose, UA  negative  NEGATIVE  NEGATIVE    Bilirubin, UA  negtaive      Ketones, UA  negative      Spec Grav, UA  1.025      Blood, UA  negative      pH, UA  6.0      Protein, UA  negative      Urobilinogen, UA  negative   0.2    Nitrite, UA  negative      Leukocytes, UA Negative Negative              Assessment & Plan:

## 2016-04-17 ENCOUNTER — Encounter: Payer: Self-pay | Admitting: Internal Medicine

## 2016-04-17 ENCOUNTER — Ambulatory Visit (INDEPENDENT_AMBULATORY_CARE_PROVIDER_SITE_OTHER): Payer: PRIVATE HEALTH INSURANCE | Admitting: Internal Medicine

## 2016-04-17 DIAGNOSIS — B9789 Other viral agents as the cause of diseases classified elsewhere: Secondary | ICD-10-CM | POA: Diagnosis not present

## 2016-04-17 DIAGNOSIS — J069 Acute upper respiratory infection, unspecified: Secondary | ICD-10-CM | POA: Diagnosis not present

## 2016-04-17 MED ORDER — FLUTICASONE PROPIONATE 50 MCG/ACT NA SUSP: 2.0000 | Freq: Every day | NASAL | 1 refills | Status: DC

## 2016-04-17 NOTE — Patient Instructions (Signed)
We have sent in the flonase for the congestion. Use 2 sprays in each nostril once a day to help. Use this for the next 1-2 weeks.   If you are not feeling better by Tuesday or Wednesday call us back.

## 2016-04-17 NOTE — Progress Notes (Signed)
   Subjective:    Patient ID: [name], female    DOB: 05-23-1955, 61 y.o.   MRN: [id number]  HPI The patient is a 61 YO female coming in for cold symptoms for about 4 days. She is having nasal congestion and drainage. Some sinus pressure and ear pain. She is not having cough or SOB. She is having fatigue and muscle aches. She has been around sick contacts. Did have flu shot. Has not tried anything for the symptoms. No fevers or chills.   Review of Systems  Constitutional: Positive for activity change and fatigue. Negative for appetite change, chills, fever and unexpected weight change.  HENT: Positive for congestion, ear pain, rhinorrhea, sinus pain and sinus pressure. Negative for dental problem, drooling, ear discharge, nosebleeds, postnasal drip, sore throat and trouble swallowing.   Eyes: Negative.   Respiratory: Negative for cough, choking, shortness of breath and wheezing.   Cardiovascular: Negative.   Gastrointestinal: Negative.   Musculoskeletal: Positive for myalgias.      Objective:   Physical Exam  Constitutional: She is oriented to person, place, and time. She appears well-developed and well-nourished.  HENT:  Head: Normocephalic and atraumatic.  TMs normal bilaterally, oropharynx with redness and clear drainage, nose without crusting, sinus pressure frontal.   Eyes: EOM are normal.  Neck: No JVD present.  Cardiovascular: Normal rate and regular rhythm.   Pulmonary/Chest: Effort normal and breath sounds normal. No respiratory distress. She has no wheezes. She has no rales.  Abdominal: Soft. She exhibits no distension. There is no tenderness. There is no rebound.  Lymphadenopathy:    She has no cervical adenopathy.  Neurological: She is alert and oriented to person, place, and time.  Skin: Skin is warm and dry.   Vitals:   04/17/16 1358  BP: 140/90  Pulse: 73  Resp: 16  Temp: 98.1 F (36.7 C)  TempSrc: Oral  SpO2: 99%  Weight: 249 lb 8 oz (113.2 kg)    Height: 5\' 7"  (1.702 m)       Assessment & Plan:

## 2016-04-17 NOTE — Assessment & Plan Note (Addendum)
Rx for flonase for symptoms. Likely viral cold. She is advised to try symptom management. Expected course is 7-10 days. Given her lupus will treat with antibiotics if no improvement by Tuesday or Wednesday.

## 2016-04-17 NOTE — Progress Notes (Signed)
Pre visit review using our clinic review tool, if applicable. No additional management support is needed unless otherwise documented below in the visit note. 

## 2016-04-21 DIAGNOSIS — M329 Systemic lupus erythematosus, unspecified: Secondary | ICD-10-CM | POA: Diagnosis not present

## 2016-04-21 DIAGNOSIS — Z79899 Other long term (current) drug therapy: Secondary | ICD-10-CM | POA: Diagnosis not present

## 2016-04-21 DIAGNOSIS — Z6841 Body Mass Index (BMI) 40.0 and over, adult: Secondary | ICD-10-CM | POA: Diagnosis not present

## 2016-04-21 DIAGNOSIS — M3214 Glomerular disease in systemic lupus erythematosus: Secondary | ICD-10-CM | POA: Diagnosis not present

## 2016-05-03 ENCOUNTER — Other Ambulatory Visit: Payer: Self-pay | Admitting: Internal Medicine

## 2016-05-21 ENCOUNTER — Other Ambulatory Visit: Payer: Self-pay | Admitting: Cardiovascular Disease

## 2016-05-27 DIAGNOSIS — D631 Anemia in chronic kidney disease: Secondary | ICD-10-CM | POA: Diagnosis not present

## 2016-05-27 DIAGNOSIS — E785 Hyperlipidemia, unspecified: Secondary | ICD-10-CM | POA: Diagnosis not present

## 2016-05-27 DIAGNOSIS — Z6839 Body mass index (BMI) 39.0-39.9, adult: Secondary | ICD-10-CM | POA: Diagnosis not present

## 2016-05-27 DIAGNOSIS — Z6841 Body Mass Index (BMI) 40.0 and over, adult: Secondary | ICD-10-CM | POA: Diagnosis not present

## 2016-05-27 DIAGNOSIS — N181 Chronic kidney disease, stage 1: Secondary | ICD-10-CM | POA: Diagnosis not present

## 2016-06-24 ENCOUNTER — Ambulatory Visit (INDEPENDENT_AMBULATORY_CARE_PROVIDER_SITE_OTHER): Payer: PRIVATE HEALTH INSURANCE | Admitting: Sports Medicine

## 2016-06-24 ENCOUNTER — Encounter: Payer: Self-pay | Admitting: Sports Medicine

## 2016-06-24 ENCOUNTER — Ambulatory Visit (INDEPENDENT_AMBULATORY_CARE_PROVIDER_SITE_OTHER): Payer: PRIVATE HEALTH INSURANCE

## 2016-06-24 VITALS — BP 130/88 | HR 63 | Ht 67.0 in | Wt 249.8 lb

## 2016-06-24 DIAGNOSIS — I2510 Atherosclerotic heart disease of native coronary artery without angina pectoris: Secondary | ICD-10-CM

## 2016-06-24 DIAGNOSIS — M25552 Pain in left hip: Secondary | ICD-10-CM

## 2016-06-24 DIAGNOSIS — M3214 Glomerular disease in systemic lupus erythematosus: Secondary | ICD-10-CM

## 2016-06-24 DIAGNOSIS — M4726 Other spondylosis with radiculopathy, lumbar region: Secondary | ICD-10-CM

## 2016-06-24 MED ORDER — METHYLPREDNISOLONE 4 MG PO TBPK: ORAL_TABLET | ORAL | 0 refills | Status: DC

## 2016-06-24 MED ORDER — GABAPENTIN 300 MG PO CAPS: ORAL_CAPSULE | ORAL | 1 refills | Status: DC

## 2016-06-24 NOTE — Patient Instructions (Addendum)
If you are not improving over the next 1-2 weeks please call let us know.  Need to perform a therapeutic exercises on a 6 day per week basis.  Please perform the exercise program that Jeneen Rinks has prepared for you and gone over in detail on a daily basis.  In addition to the handout you were provided you can access your program through: [email or web address]   Your unique program code is: [email or web address]

## 2016-06-24 NOTE — Progress Notes (Signed)
OFFICE VISIT NOTE Juanda Bond. Kewan Mcnease, [place] [facility] [place]  RAIANNA SLIGHT - 61 y.o. female MRN [id number]  Date of birth: May 26, 1955  Visit Date: 06/24/2016  PCP: Hoyt Koch, MD   Referred by: Hoyt Koch, *  SUBJECTIVE:   Chief Complaint  Patient presents with  . pain in back    low back pain x 10 days. She was trying to stand up to get out of bed and her back went out. Pain is on both sides of back but worse on the left side. She also c/o sciatica. Pain is about a 5 now, 10 when it first occured. She had an injection at urgent care and naproxen and got no relief. She has tried Tylenol with minimal relief. She has tried using heat with some relief.    HPI:  History reviewed as above. Centralized low back pain.  Initially had some pain radiating down left posterior leg but not past the knee. History is significant for lupus.  Denies fevers, chills, recent weight gain or weight loss.  No night sweats. No significant nighttime awakenings due to this issue. Pt denies any change in bowel or bladder habits, muscle weakness, numbness or falls associated with this pain.   ROS  Otherwise per HPI.  HISTORY & PERTINENT PRIOR DATA:  No specialty comments available. She reports that she has been smoking Cigarettes.  She has a 15.00 pack-year smoking history. She has never used smokeless tobacco. No results for input(s): HGBA1C, LABURIC in the last 8760 hours. Medications & Allergies reviewed per EMR Patient Active Problem List   Diagnosis Date Noted  . Osteoarthritis of spine with radiculopathy, lumbar region 06/24/2016  . Viral URI 04/17/2016  . Pain in the chest 11/18/2015  . Immunocompromised patient ([place]) 11/14/2015  . CAP (community acquired pneumonia) 03/12/2015  . Acute sinusitis 08/23/2014  . Health care maintenance 04/26/2011  . Hypothyroidism 04/08/2011  . UTI (urinary tract infection) 05/29/2009    . Atherosclerosis of native coronary artery of native heart without angina pectoris 02/26/2009  . Mitral valve disorders(424.0) 02/26/2009  . Hyperlipidemia 01/26/2009  . Cough 12/09/2007  . Immune thrombocytopenic purpura ([place]) 08/05/2007  . Chronic ischemic heart disease 08/05/2007  . PVD 08/05/2007  . DVT 08/05/2007  . GERD 08/05/2007  . Systemic lupus erythematosus with focal and segmental proliferative glomerulonephritis ([place]) 08/05/2007   Past Medical History:  Diagnosis Date  . Chronic ischemic heart disease, unspecified    w/ stents in mid circumflex and mid RCA in 03, and balloon angioplasty of Diagonal branch 10-03, last cath Sept 08- 2 vessel disease w/ patent stents  . Esophageal reflux   . Hypothyroidism   . Immune thrombocytopenic purpura ([place])   . Lupus erythematosus   . Mitral valve insufficiency and aortic valve insufficiency   . Myocardial infarction ([place])    x 2  . Peripheral vascular disease, unspecified ([place])   . Unspecified disease of pericardium    Family History  Problem Relation Age of Onset  . Cancer Mother     breast and cervical  . Paget's disease of bone Mother   . Fibromyalgia Mother   . Hypertension Father   . Heart disease Father     PVD  . GER disease Father   . Cancer Paternal Grandfather     colon  . Heart attack Other   . Coronary artery disease Other   . Diabetes Other   . Hypertension  Other   . Hyperlipidemia Other    Past Surgical History:  Procedure Laterality Date  . CHOLECYSTECTOMY, LAPAROSCOPIC  2010  . plastic surgical repair      dog bite to leg  . SPLENECTOMY  5-04   Social History   Occupational History  . Not on file.   Social History Main Topics  . Smoking status: Current Some Day Smoker    Packs/day: 0.50    Years: 30.00    Types: Cigarettes    Last attempt to quit: 12/23/2011  . Smokeless tobacco: Never Used  . Alcohol use No  . Drug use: No  . Sexual activity: No    OBJECTIVE:  VS:  HT:5\' 7"   (170.2 cm)   WT:249 lb 12.8 oz (113.3 kg)  BMI:39.2    BP:130/88  HR:63bpm  TEMP: ( )  RESP:95 % Physical Exam  Constitutional: She appears well-developed and well-nourished. She is cooperative.  Non-toxic appearance.  HENT:  Head: Normocephalic and atraumatic.  Cardiovascular: Intact distal pulses.   Pulmonary/Chest: No accessory muscle usage. No respiratory distress.  Neurological: She is alert. She is not disoriented. She displays normal reflexes. No sensory deficit.  Skin: Skin is warm, dry and intact. Capillary refill takes less than 2 seconds. No abrasion and no rash noted.  Psychiatric: She has a normal mood and affect. Her speech is normal and behavior is normal. Thought content normal.   Back & Lower Extremities:  No significant rashes/lesions/ulcerations overlying the legs or back  No significant pretibial edema.  No LE clubbing or cyanosis.  DP & PT pulses 2+/4.  LE Sensation intact to light touch in LE dermatomes.  Bilateral negative straight leg raise.  Tight hamstrings bilaterally left greater than  No significant midline tenderness.    Generalized TTP over the bilateral paraspinal regions.  Good internal and external rotation of the hips.  Manual muscle testing is 5+/5 in BLE myotomes without focality  Lower extremity DTRs 2+/4 diffusely and symmetric    IMAGING & PROCEDURES: Dg Lumbar Spine 2-3 Views  Result Date: 06/24/2016 CLINICAL DATA:  Low back pain.  No known injury. EXAM: LUMBAR SPINE - 2-3 VIEW COMPARISON:  No recent prior. FINDINGS: Diffuse multilevel degenerative change. No evidence of fracture or dislocation. No acute bony abnormality. Aortoiliac atherosclerotic vascular calcification. Pelvic calcifications consistent phleboliths. Surgical clips right upper quadrant. IMPRESSION: 1. Diffuse multilevel degenerative change. No acute bony abnormality. 2. Aortoiliac atherosclerotic vascular disease. Electronically Signed   By: Marcello Moores  Register   On:  06/24/2016 15:54   No additional findings.   ASSESSMENT & PLAN:  Visit Diagnoses:  1. Systemic lupus erythematosus with focal and segmental proliferative glomerulonephritis ([place])   2. Osteoarthritis of spine with radiculopathy, lumbar region    Meds:  Meds ordered this encounter  Medications  . methylPREDNISolone (MEDROL DOSEPAK) 4 MG TBPK tablet    Sig: Take by mouth as directed. Take 6 tablets on the first day prescribed then as directed.    Dispense:  21 tablet    Refill:  0  . gabapentin (NEURONTIN) 300 MG capsule    Sig: Start with 1 tab po qhs X 1 week, then increase to 1 tab po bid X 1 week then 1 tab po tid prn    Dispense:  90 capsule    Refill:  1    Orders:  Orders Placed This Encounter  Procedures  . DG Lumbar Spine 2-3 Views    Follow-up: Return in about 6 weeks (around 08/05/2016).  Otherwise please see problem oriented charting as below.

## 2016-06-27 ENCOUNTER — Other Ambulatory Visit: Payer: Self-pay | Admitting: Cardiovascular Disease

## 2016-07-08 NOTE — Progress Notes (Signed)
PROCEDURE NOTE: THERAPEUTIC EXERCISES (97110) 15 minutes spent for Therapeutic exercises as stated in above notes.  This included exercises focusing on stretching, strengthening, with significant focus on eccentric aspects.   Proper technique shown and discussed handout in great detail with ATC.  All questions were discussed and answered.   

## 2016-07-09 ENCOUNTER — Encounter: Payer: Self-pay | Admitting: Sports Medicine

## 2016-07-09 ENCOUNTER — Ambulatory Visit: Payer: Self-pay

## 2016-07-09 ENCOUNTER — Ambulatory Visit (INDEPENDENT_AMBULATORY_CARE_PROVIDER_SITE_OTHER): Payer: PRIVATE HEALTH INSURANCE | Admitting: Sports Medicine

## 2016-07-09 VITALS — BP 122/84 | HR 62 | Ht 67.0 in | Wt 252.6 lb

## 2016-07-09 DIAGNOSIS — I2510 Atherosclerotic heart disease of native coronary artery without angina pectoris: Secondary | ICD-10-CM

## 2016-07-09 DIAGNOSIS — M4726 Other spondylosis with radiculopathy, lumbar region: Secondary | ICD-10-CM

## 2016-07-09 DIAGNOSIS — M7062 Trochanteric bursitis, left hip: Secondary | ICD-10-CM | POA: Insufficient documentation

## 2016-07-09 DIAGNOSIS — M3214 Glomerular disease in systemic lupus erythematosus: Secondary | ICD-10-CM | POA: Diagnosis not present

## 2016-07-09 DIAGNOSIS — M5432 Sciatica, left side: Secondary | ICD-10-CM | POA: Diagnosis not present

## 2016-07-09 NOTE — Progress Notes (Signed)
OFFICE VISIT NOTE [name]. [place], [place] [facility] [place]  [name] - 61 y.o. female MRN [id number]  Date of birth: [birth year]  Visit Date: 07/09/2016  PCP: Hoyt Koch, MD   Referred by: Gayla Doss, CMA acting as scribe for Dr. Paulla Fore.  SUBJECTIVE:   Chief Complaint  Patient presents with  . sciatic pain   Left leg pain Left lateral hip and leg pain  pain has gotten worse since last OV  No improvement with Medrol and Gabapentin The pain is described as sharp, tender to palpation and is rated as 10/10. Worsened with palpation Improves with stretching, taking a bath Therapies tried include: hydrocodone, no relief - medrol/gabapentin only temporarily   Other associated symptoms include: hip pain, swelling down posterior aspect of left leg    Otherwise ROS as it pertains to the Chief Complaint is as below:      Review of Systems  Constitutional: Negative for chills and fever.  Cardiovascular: Positive for leg swelling.  Musculoskeletal: Positive for myalgias.  Neurological: Positive for tingling.    Otherwise per HPI.  HISTORY & PERTINENT PRIOR DATA:  No specialty comments available. She reports that she has been smoking Cigarettes.  She has a 15.00 pack-year smoking history. She has never used smokeless tobacco. No results for input(s): HGBA1C, LABURIC in the last 8760 hours. Medications & Allergies reviewed per EMR Patient Active Problem List   Diagnosis Date Noted  . Pain of left hip joint 07/21/2016  . Trochanteric bursitis of left hip 07/09/2016  . Osteoarthritis of spine with radiculopathy, lumbar region 06/24/2016  . Viral URI 04/17/2016  . Pain in the chest 11/18/2015  . Immunocompromised patient ([place]) 11/14/2015  . CAP (community acquired pneumonia) 03/12/2015  . Acute sinusitis 08/23/2014  . Health care maintenance 04/26/2011  . Hypothyroidism  04/08/2011  . UTI (urinary tract infection) 05/29/2009  . Atherosclerosis of native coronary artery of native heart without angina pectoris 02/26/2009  . Mitral valve disorders(424.0) 02/26/2009  . Hyperlipidemia 01/26/2009  . Cough 12/09/2007  . Immune thrombocytopenic purpura ([place]) 08/05/2007  . Chronic ischemic heart disease 08/05/2007  . PVD 08/05/2007  . DVT 08/05/2007  . GERD 08/05/2007  . Systemic lupus erythematosus with focal and segmental proliferative glomerulonephritis ([place]) 08/05/2007   Past Medical History:  Diagnosis Date  . Chronic ischemic heart disease, unspecified    w/ stents in mid circumflex and mid RCA in 03, and balloon angioplasty of Diagonal branch 10-03, last cath Sept 08- 2 vessel disease w/ patent stents  . Esophageal reflux   . Hypothyroidism   . Immune thrombocytopenic purpura ([place])   . Lupus erythematosus   . Mitral valve insufficiency and aortic valve insufficiency   . Myocardial infarction ([place])    x 2  . Peripheral vascular disease, unspecified ([place])   . Unspecified disease of pericardium    Family History  Problem Relation Age of Onset  . Cancer Mother        breast and cervical  . Paget's disease of bone Mother   . Fibromyalgia Mother   . Hypertension Father   . Heart disease Father        PVD  . GER disease Father   . Cancer Paternal Grandfather        colon  . Heart attack Other   . Coronary artery disease Other   . Diabetes Other   . Hypertension Other   .  Hyperlipidemia Other    Past Surgical History:  Procedure Laterality Date  . CHOLECYSTECTOMY, LAPAROSCOPIC  2010  . plastic surgical repair      dog bite to leg  . SPLENECTOMY  5-04   Social History   Occupational History  . Not on file.   Social History Main Topics  . Smoking status: Current Some Day Smoker    Packs/day: 0.50    Years: 30.00    Types: Cigarettes    Last attempt to quit: 12/23/2011  . Smokeless tobacco: Never Used  . Alcohol use No  . Drug use:  No  . Sexual activity: No    OBJECTIVE:  VS:  HT:5\' 7"  (101.7 cm)   WT:252 lb 9.6 oz (114.6 kg)  BMI:39.6    BP:122/84  HR:62bpm  TEMP: ( )  RESP:94 % Physical Exam Findings:  WDWN, NAD, Non-toxic appearing Alert & appropriately interactive Not depressed or anxious appearing No increased work of breathing. Pupils are equal. EOM intact without nystagmus No clubbing or cyanosis of the extremities appreciated Lupus associated skin changes appreciated in bilateral lower extremities without significant ulceration. DP & PT pulses 2+/4.  No significant pretibial edema.  No clubbing or cyanosis Sensation intact to light touch in lower extremities.  Left hip and back: Marked TTP along the greater trochanter on the left.  She has good internal and external range of motion of the left hip.  Negative straight leg raise although she has tight reports tightness in the posterior aspect of her leg. No focal TTP within the popliteal fossa.  Marked pain within the gluteal musculature on the left.  She is moderately weak with hip abduction on the left.   ++++++++++++++++++++++++++++++++++++++++++++++ Dg Lumbar Spine 2-3 Views Result Date: 06/24/2016 CLINICAL DATA:  Low back pain.  No known injury. EXAM: LUMBAR SPINE - 2-3 VIEW COMPARISON:  No recent prior. FINDINGS: Diffuse multilevel degenerative change. No evidence of fracture or dislocation. No acute bony abnormality. Aortoiliac atherosclerotic vascular calcification. Pelvic calcifications consistent phleboliths. Surgical clips right upper quadrant. IMPRESSION: 1. Diffuse multilevel degenerative change. No acute bony abnormality. 2. Aortoiliac atherosclerotic vascular disease. Electronically Signed   By: Marcello Moores  Register   On: 06/24/2016 15:54    Dg Lumbar Spine 2-3 Views Result Date: 06/24/2016 CLINICAL DATA:  Low back pain.  No known injury. EXAM: LUMBAR SPINE - 2-3 VIEW COMPARISON:  No recent prior. FINDINGS: Diffuse multilevel degenerative  change. No evidence of fracture or dislocation. No acute bony abnormality. Aortoiliac atherosclerotic vascular calcification. Pelvic calcifications consistent phleboliths. Surgical clips right upper quadrant. IMPRESSION: 1. Diffuse multilevel degenerative change. No acute bony abnormality. 2. Aortoiliac atherosclerotic vascular disease. Electronically Signed   By: Marcello Moores  Register   On: 06/24/2016 15:54     ASSESSMENT & PLAN:   Problem List Items Addressed This Visit    Systemic lupus erythematosus with focal and segmental proliferative glomerulonephritis ([place]) (Chronic)    Given the underlying autoimmune condition if any lack of improvement consider further evaluation of systemic involvement.      Osteoarthritis of spine with radiculopathy, lumbar region    She does have a moderate degree of degenerative change within the lumbar spine.  If any lack of improvement with the above treatment consider further evaluation of her lumbar spine.      Trochanteric bursitis of left hip    Patient does have focal TTP with small bursal found on [facility] ultrasound today.  We will go ahead and inject this today and plan to follow-up with her.  If any lack of improvement consider further diagnostic evaluation of her lumbar spine.  +++++++++++++++++++++++++++++++++++++++++++++++++++++++++++++++++++++++++++++ PROCEDURE NOTE -  ULTRASOUND GUIDEDINJECTION: Left Greater Trochanter Images were obtained and interpreted by myself, Teresa Coombs, DO  Images have been saved and stored to PACS system. Images obtained on: GE S7 Ultrasound machine  ULTRASOUND FINDINGS:  Tendoninopathic gluteal musculature over the greater trochanter with small bursal formation over the greater trochanter.  DESCRIPTION OF PROCEDURE:  The patient's clinical condition is marked by substantial pain and/or significant functional disability. Other conservative therapy has not provided relief, is contraindicated, or not appropriate. There is  a reasonable likelihood that injection will significantly improve the patient's pain and/or functional impairment. After discussing the risks, benefits and expected outcomes of the injection and all questions were reviewed and answered, the patient wished to undergo the above named procedure. Verbal consent was obtained. The ultrasound was used to identify the target structure and adjacent neurovascular structures. The skin was then prepped in sterile fashion and the target structure was injected under direct visualization using sterile technique as below: PREP: Alcohol, Ethel Chloride APPROACH: Lateral, single injection, 22g 3.5" needle INJECTATE: 2 cc 0.5% marcaine, 2cc 40mg  DepoMedrol ASPIRATE: N/A DRESSING: Band-Aid  Post procedural instructions including recommending icing and warning signs for infection were reviewed. This procedure was well tolerated and there were no complications.   IMPRESSION: Succesful US Guided Injection           Other Visit Diagnoses    Sciatic pain, left    -  Primary   Relevant Orders   [name] LIMITED JOINT SPACE STRUCTURES LOW LEFT(NO LINKED CHARGES)      Follow-up: Return in about 4 weeks (around 08/06/2016).  Otherwise please see problem oriented charting as below.  CMA/ATC served as Education administrator during this visit. History, Physical, and Plan performed by medical provider. Documentation and orders reviewed and attested to.      Teresa Coombs, [facility] Physician    08/11/2016 8:33 AM

## 2016-07-18 ENCOUNTER — Encounter: Payer: Self-pay | Admitting: Cardiology

## 2016-07-18 ENCOUNTER — Ambulatory Visit (INDEPENDENT_AMBULATORY_CARE_PROVIDER_SITE_OTHER): Payer: PRIVATE HEALTH INSURANCE | Admitting: Cardiology

## 2016-07-18 VITALS — BP 124/80 | HR 60 | Ht 67.0 in | Wt 252.0 lb

## 2016-07-18 DIAGNOSIS — I340 Nonrheumatic mitral (valve) insufficiency: Secondary | ICD-10-CM | POA: Diagnosis not present

## 2016-07-18 DIAGNOSIS — E785 Hyperlipidemia, unspecified: Secondary | ICD-10-CM

## 2016-07-18 DIAGNOSIS — I2510 Atherosclerotic heart disease of native coronary artery without angina pectoris: Secondary | ICD-10-CM

## 2016-07-18 DIAGNOSIS — Z79899 Other long term (current) drug therapy: Secondary | ICD-10-CM

## 2016-07-18 DIAGNOSIS — I739 Peripheral vascular disease, unspecified: Secondary | ICD-10-CM | POA: Diagnosis not present

## 2016-07-18 MED ORDER — ATORVASTATIN CALCIUM 10 MG PO TABS: 10.0000 mg | ORAL_TABLET | Freq: Every day | ORAL | 3 refills | Status: DC

## 2016-07-18 NOTE — Progress Notes (Signed)
07/18/2016 [name]   22-Nov-1955  [id number]  Primary Physician Hoyt Koch, MD Primary Cardiologist: Dr. Angelena Form    Reason for Visit/CC: f/u for CAD and MR; new CC of Bilateral Leg Pain   HPI:  [name] is a 61 y.o. female, who works as a Clinical biochemist at [employer], with past medical history of CAD (s/p BMS to RCA and LCx in 2003, PTCA to diagonal in 2003), ischemic cardiomyopathy (EF 45-50% by echo in 08/2014) moderate MR, HLD, GERD, hypothyroidism, SLE, and tobacco use. She has been followed by Dr. Angelena Form but not seen by him since 2016. Prior to that, she was being followed by Dr. Melvern Banker. Her last OV was 04/2015 with Richardson Dopp, PA-C. She was last seen by Dr. [place], at [facility] 11/19/15, for consultation after presenting with atypical CP. Based on Dr. Doug Sou consult note, it was felt that her CP was noncardiac and likely GI related.   She is back today for f/u. She has done well from a cardiac standpoint, in terms of symptoms. She denies CP and dyspnea. No orthopnea or PND. She has not required use of SL NTG. She does admit that she stopped Crestor 2 months ago due to cost. Not currently on any meds for cholesterol. She has been compliant with all other meds. She continues to smoke. She has been a smoker for 30+ years.   She also complains of bilateral leg and low back pain, L sided pain > right. She has pain at rest but also worse with ambulation. She is unsure if this is related to sciatica vs PVD. She does have decreased DPs on exam and has multiple risk factors.     Current Meds  Medication Sig  . aspirin 81 MG chewable tablet Chew 81 mg by mouth daily.   Marland Kitchen atenolol (TENORMIN) 25 MG tablet Take 25 mg by mouth daily.  Marland Kitchen azaTHIOprine (IMURAN) 50 MG tablet Take 50 mg by mouth 2 (two) times daily.  . fluticasone (FLONASE) 50 MCG/ACT nasal spray Place 2 sprays into both nostrils daily.  . furosemide (LASIX) 40 MG tablet Take 40 mg by mouth daily as needed for edema.   .  gabapentin (NEURONTIN) 300 MG capsule Start with 1 tab po qhs X 1 week, then increase to 1 tab po bid X 1 week then 1 tab po tid prn  . hydroxychloroquine (PLAQUENIL) 200 MG tablet Take 200 mg by mouth daily.   Marland Kitchen levothyroxine (SYNTHROID, LEVOTHROID) 50 MCG tablet TAKE 1 TABLET BY MOUTH EVERY DAY  . losartan (COZAAR) 25 MG tablet Take 25 mg by mouth daily.  . nitroGLYCERIN (NITROSTAT) 0.4 MG SL tablet Place 0.4 mg under the tongue every 5 (five) minutes x 3 doses as needed for chest pain.   . pantoprazole (PROTONIX) 40 MG tablet Take 1 tablet (40 mg total) by mouth daily.  . [DISCONTINUED] rosuvastatin (CRESTOR) 5 MG tablet Take 5 mg by mouth every other day.   No Known Allergies Past Medical History:  Diagnosis Date  . Chronic ischemic heart disease, unspecified    w/ stents in mid circumflex and mid RCA in 03, and balloon angioplasty of Diagonal branch 10-03, last cath Sept 08- 2 vessel disease w/ patent stents  . Esophageal reflux   . Hypothyroidism   . Immune thrombocytopenic purpura ([place])   . Lupus erythematosus   . Mitral valve insufficiency and aortic valve insufficiency   . Myocardial infarction ([place])    x 2  . Peripheral vascular  disease, unspecified ([place])   . Unspecified disease of pericardium    Family History  Problem Relation Age of Onset  . Cancer Mother     breast and cervical  . Paget's disease of bone Mother   . Fibromyalgia Mother   . Hypertension Father   . Heart disease Father     PVD  . GER disease Father   . Cancer Paternal Grandfather     colon  . Heart attack Other   . Coronary artery disease Other   . Diabetes Other   . Hypertension Other   . Hyperlipidemia Other    Past Surgical History:  Procedure Laterality Date  . CHOLECYSTECTOMY, LAPAROSCOPIC  2010  . plastic surgical repair      dog bite to leg  . SPLENECTOMY  5-04   Social History   Social History  . Marital status: Single    Spouse name: N/A  . Number of children: N/A  . Years of  education: N/A   Occupational History  . Not on file.   Social History Main Topics  . Smoking status: Current Some Day Smoker    Packs/day: 0.50    Years: 30.00    Types: Cigarettes    Last attempt to quit: 12/23/2011  . Smokeless tobacco: Never Used  . Alcohol use No  . Drug use: No  . Sexual activity: No   Other Topics Concern  . Not on file   Social History Narrative   HSG,  graduated from [employer] in [place]. In college [name] -grad '13 with relgious studies. [employer] - Fall '13 for MA-divinity. Marrried '73 - 2 years/ divorced. 2 son- '73, '75 - CP.    Occupation: full-time Ship broker. She lives alone with her younger son living with her part-time but he resides in a managed care facility.      Review of Systems: General: negative for chills, fever, night sweats or weight changes.  Cardiovascular: negative for chest pain, dyspnea on exertion, edema, orthopnea, palpitations, paroxysmal nocturnal dyspnea or shortness of breath Dermatological: negative for rash Respiratory: negative for cough or wheezing Urologic: negative for hematuria Abdominal: negative for nausea, vomiting, diarrhea, bright red blood per rectum, melena, or hematemesis Neurologic: negative for visual changes, syncope, or dizziness All other systems reviewed and are otherwise negative except as noted above.   Physical Exam:  Blood pressure 124/80, pulse 60, height 5\' 7"  (1.702 m), weight 252 lb (114.3 kg).  General appearance: alert, cooperative, no distress and moderately obese Neck: no carotid bruit and no JVD Lungs: clear to auscultation bilaterally Heart: regular rate and rhythm, S1, S2 normal, no murmur, click, rub or gallop Extremities: extremities normal, atraumatic, no cyanosis or edema Pulses: decreased DPs bilaterally  Skin: Skin color, texture, turgor normal. No rashes or lesions Neurologic: Grossly normal  EKG NSR, 60 bpm-- personally reviewed   ASSESSMENT AND PLAN:   1.  CAD: s/p BMS to RCA and LCx in 2003, PTCA to diagonal in 2003. Stable. Denies CP. No dyspnea. No use of SL NTG. Continue medical therapy. ASA and statin. Smoking cessation strongly advised.   2. Ischemic Cardiomyopathy: EF 45-50% by last echo in 2016. Denies dyspnea. No LEE. repeat echo for MR.   3. Mitral Regurgitation: Moderate by echo in 2016. She was ordered to get 1 year f/u echo in 2017 but failed to schedule. We need to reassess. Will order echo.   4. HLD: Last lipid panel on file is from 2016. LDL at that time  was not at goal. LDL was 128. She reports she has been off of Crestor for the last 2 months due to cost. She had intolerance to Lipitor in the past due to myalgias, but she is willing to try a low dose. Will start Lipitor 10 mg. Repeat FLP and HFTs in 6-8 weeks. LDL goal is < 70.   5. Tobacco Abuse: 30 + year history of tobacco use. Smoking cessation strongly advised.   6. Bilateral Leg Pain: symptoms are concerning for claudication. She is a smoker of 30+ years with known coronary disease and HLD that is not well controlled. She has decreased DPs bilaterally. Will arrange bilateral LE arterial dopplers with ABIs. If suggestive of PAD, will refer to Dr. Gwenlyn Found.    Follow-Up with Dr. Angelena Form in 6 months. If abnormal LEE dopplers, will refer to Dr. Gwenlyn Found.   Brittainy Ladoris Gene, MHS [facility] 07/18/2016 2:13 PM

## 2016-07-18 NOTE — Patient Instructions (Signed)
Medication Instructions:   STOP crestor  START atorvastatin (Lipitor) 10mg  once daily  Labwork:  Your physician recommends that you return for lab work in 6-8 weeks (fasting) to check cholesterol & liver function   Testing/Procedures:  Your physician has requested that you have an echocardiogram. Echocardiography is a painless test that uses sound waves to create images of your heart. It provides your doctor with information about the size and shape of your heart and how well your heart's chambers and valves are working. This procedure takes approximately one hour. There are no restrictions for this procedure.  Your physician has requested that you have a lower extremity arterial duplex. This test is an ultrasound of the arteries in the legs. It looks at arterial blood flow in the legs. Allow one hour for Lower Arterial scans. There are no restrictions or special instructions. This is done at [facility] (second floor)  Follow-Up:  Your physician wants you to follow-up in: 6 months with Dr. Shirley Friar will receive a reminder letter in the mail two months in advance. If you don't receive a letter, please call our office to schedule the follow-up appointment.   Any Other Special Instructions Will Be Listed Below (If Applicable).

## 2016-07-21 DIAGNOSIS — M25551 Pain in right hip: Secondary | ICD-10-CM | POA: Insufficient documentation

## 2016-07-21 DIAGNOSIS — M25552 Pain in left hip: Secondary | ICD-10-CM | POA: Insufficient documentation

## 2016-07-21 NOTE — Assessment & Plan Note (Signed)
Patient does have significant changes in her low back.  Therapeutic exercises working on core conditioning reviewed.  She does have a mild amount of pain over the lateral aspect of the leg however more focally seems to be over the gluteal and low back area.

## 2016-07-21 NOTE — Assessment & Plan Note (Signed)
Pain is diffusely located around the left lateral hip and gluteal region.  Suspect underlying radicular component but if any lack of improvement consider further diagnostic evaluation of the hip.

## 2016-07-21 NOTE — Assessment & Plan Note (Signed)
Patient is followed for lupus and has multi-system involvement.  Suspect some of this is underlying autoimmune related.  Will treat from a systemic standpoint if any lack of improvement consider further diagnostic workup at that time.

## 2016-07-24 ENCOUNTER — Other Ambulatory Visit: Payer: Self-pay | Admitting: Cardiovascular Disease

## 2016-08-02 ENCOUNTER — Other Ambulatory Visit: Payer: Self-pay | Admitting: Internal Medicine

## 2016-08-02 ENCOUNTER — Other Ambulatory Visit: Payer: Self-pay | Admitting: Cardiovascular Disease

## 2016-08-04 DIAGNOSIS — M545 Low back pain: Secondary | ICD-10-CM | POA: Diagnosis not present

## 2016-08-04 DIAGNOSIS — R5383 Other fatigue: Secondary | ICD-10-CM | POA: Diagnosis not present

## 2016-08-04 DIAGNOSIS — Z79899 Other long term (current) drug therapy: Secondary | ICD-10-CM | POA: Diagnosis not present

## 2016-08-04 DIAGNOSIS — M329 Systemic lupus erythematosus, unspecified: Secondary | ICD-10-CM | POA: Diagnosis not present

## 2016-08-04 DIAGNOSIS — M3214 Glomerular disease in systemic lupus erythematosus: Secondary | ICD-10-CM | POA: Diagnosis not present

## 2016-08-04 DIAGNOSIS — Z6841 Body Mass Index (BMI) 40.0 and over, adult: Secondary | ICD-10-CM | POA: Diagnosis not present

## 2016-08-05 ENCOUNTER — Ambulatory Visit (INDEPENDENT_AMBULATORY_CARE_PROVIDER_SITE_OTHER): Payer: PRIVATE HEALTH INSURANCE | Admitting: Sports Medicine

## 2016-08-05 ENCOUNTER — Encounter: Payer: Self-pay | Admitting: Sports Medicine

## 2016-08-05 VITALS — BP 124/84 | HR 71 | Ht 67.0 in | Wt 255.8 lb

## 2016-08-05 DIAGNOSIS — I2510 Atherosclerotic heart disease of native coronary artery without angina pectoris: Secondary | ICD-10-CM

## 2016-08-05 DIAGNOSIS — M4726 Other spondylosis with radiculopathy, lumbar region: Secondary | ICD-10-CM | POA: Diagnosis not present

## 2016-08-05 DIAGNOSIS — M5442 Lumbago with sciatica, left side: Secondary | ICD-10-CM | POA: Diagnosis not present

## 2016-08-05 DIAGNOSIS — G8929 Other chronic pain: Secondary | ICD-10-CM | POA: Diagnosis not present

## 2016-08-05 DIAGNOSIS — M7062 Trochanteric bursitis, left hip: Secondary | ICD-10-CM

## 2016-08-05 DIAGNOSIS — M3214 Glomerular disease in systemic lupus erythematosus: Secondary | ICD-10-CM | POA: Diagnosis not present

## 2016-08-05 NOTE — Assessment & Plan Note (Signed)
This may be contributing to some underlying inflammatory component The patient is not interested in a repeat steroid Dosepak. She did have likely a small systemic response from the greater trochanteric injection which likely helps explain the overall improvement she had from this.

## 2016-08-05 NOTE — Progress Notes (Signed)
OFFICE VISIT NOTE [name]. [place], [place] [facility] [place]  DENIZ ESKRIDGE - 61 y.o. female MRN [id number]  Date of birth: 10-29-1955  Visit Date: 08/05/2016  PCP: Hoyt Koch, MD   Referred by: Gayla Doss, CMA acting as scribe for Dr. Paulla Fore.  SUBJECTIVE:   Chief Complaint  Patient presents with  . Follow-up    left hip and leg pain   HPI: As below and per problem based documentation when appropriate.  Pt presents today to follow-up on left hip and leg pain Pain started about 1.5 months ago Pt had xray done 06/2016 which showed the following: IMPRESSION: 1. Diffuse multilevel degenerative change. No acute bony abnormality. 2. Aortoiliac atherosclerotic vascular disease.  The pain is described as sharp and tender to palpation and is rated as 6/10.  Worsened with palpation Improves with rest and a warm bath Therapies tried include Tylenol, Naproxen, Depo-Medrol, Gabapentin, and heat Pt reports some improvement since she has been using ice on the area more often.  Other associated symptoms include: Pain radiating down into the foot next  Gabapentin has been moderately helpful in diminishing symptoms has been the only thing that is consistently helpful.  She is continuing to have pain at night which does occasionally awaken her especially while laying on the left side.    Denies fevers, chills, recent weight gain or weight loss.  No night sweats.     Review of Systems  Constitutional: Negative for chills, fever and weight loss.  Cardiovascular: Positive for leg swelling.  Musculoskeletal: Positive for myalgias.  Neurological: Positive for sensory change. Negative for focal weakness.    Otherwise per HPI.  HISTORY & PERTINENT PRIOR DATA:  No specialty comments available. She reports that she has been smoking Cigarettes.  She has a 15.00 pack-year smoking history. She has  never used smokeless tobacco. No results for input(s): HGBA1C, LABURIC in the last 8760 hours. Medications & Allergies reviewed per EMR Patient Active Problem List   Diagnosis Date Noted  . Pain of left hip joint 07/21/2016  . Trochanteric bursitis of left hip 07/09/2016  . Osteoarthritis of spine with radiculopathy, lumbar region 06/24/2016  . Viral URI 04/17/2016  . Pain in the chest 11/18/2015  . Immunocompromised patient ([place]) 11/14/2015  . CAP (community acquired pneumonia) 03/12/2015  . Acute sinusitis 08/23/2014  . Health care maintenance 04/26/2011  . Hypothyroidism 04/08/2011  . UTI (urinary tract infection) 05/29/2009  . Atherosclerosis of native coronary artery of native heart without angina pectoris 02/26/2009  . Mitral valve disorders(424.0) 02/26/2009  . Hyperlipidemia 01/26/2009  . Cough 12/09/2007  . Immune thrombocytopenic purpura ([place]) 08/05/2007  . Chronic ischemic heart disease 08/05/2007  . PVD 08/05/2007  . DVT 08/05/2007  . GERD 08/05/2007  . Systemic lupus erythematosus with focal and segmental proliferative glomerulonephritis ([place]) 08/05/2007   Past Medical History:  Diagnosis Date  . Chronic ischemic heart disease, unspecified    w/ stents in mid circumflex and mid RCA in 03, and balloon angioplasty of Diagonal branch 10-03, last cath Sept 08- 2 vessel disease w/ patent stents  . Esophageal reflux   . Hypothyroidism   . Immune thrombocytopenic purpura ([place])   . Lupus erythematosus   . Mitral valve insufficiency and aortic valve insufficiency   . Myocardial infarction ([place])    x 2  . Peripheral vascular disease, unspecified ([place])   . Unspecified disease of pericardium  Family History  Problem Relation Age of Onset  . Cancer Mother        breast and cervical  . Paget's disease of bone Mother   . Fibromyalgia Mother   . Hypertension Father   . Heart disease Father        PVD  . GER disease Father   . Cancer Paternal Grandfather        colon    . Heart attack Other   . Coronary artery disease Other   . Diabetes Other   . Hypertension Other   . Hyperlipidemia Other    Past Surgical History:  Procedure Laterality Date  . CHOLECYSTECTOMY, LAPAROSCOPIC  2010  . plastic surgical repair      dog bite to leg  . SPLENECTOMY  5-04   Social History   Occupational History  . Not on file.   Social History Main Topics  . Smoking status: Current Some Day Smoker    Packs/day: 0.50    Years: 30.00    Types: Cigarettes    Last attempt to quit: 12/23/2011  . Smokeless tobacco: Never Used  . Alcohol use No  . Drug use: No  . Sexual activity: No    OBJECTIVE:  VS:  HT:5\' 7"  (170.2 cm)   WT:255 lb 12.8 oz (116 kg)  BMI:40.1    BP:124/84  HR:71bpm  TEMP: ( )  RESP:96 % EXAM: Findings:  WDWN, NAD, Non-toxic appearing Alert & appropriately interactive Not depressed or anxious appearing No increased work of breathing. Pupils are equal. EOM intact without nystagmus No clubbing or cyanosis of the extremities appreciated  No significant rashes/lesions/ulcerations appreciated overlying the examined area. DP & PT pulses 2+/4.  Trace pitting edema bilaterally No clubbing or cyanosis Generalized esthesia in bilateral lower extremities left greater than right.   Back & Lower Extremities:  Tight left greater than right bilateral straight leg raise. No significant midline tenderness.   Marked TTP diffusely over the left gluteal region, greater sciatic notch and popliteal compression test. Good internal and external rotation of the hips. Manual muscle testing is 5+/5 in BLE myotomes without focality      No results found. ASSESSMENT & PLAN:   Problem List Items Addressed This Visit    Systemic lupus erythematosus with focal and segmental proliferative glomerulonephritis ([place]) (Chronic)    This may be contributing to some underlying inflammatory component The patient is not interested in a repeat steroid Dosepak. She did have  likely a small systemic response from the greater trochanteric injection which likely helps explain the overall improvement she had from this.      Osteoarthritis of spine with radiculopathy, lumbar region    Patient does have multifactorial back and leg pain but this does seem most consistent with a radiculitis and given the ongoing symptoms, lack of improvement with home exercise program, anti-inflammatories, systemic corticosteroids and gabapentinoids further evaluation with MRI warranted.  We will plan to follow-up with her after this is obtained to discuss these results at her follow-up appointment.      Trochanteric bursitis of left hip    She has persistent painful.  I suspect the underlying issue is glute medius dysfunction secondary to neurogenic component.       Other Visit Diagnoses    Chronic left-sided low back pain with left-sided sciatica    -  Primary   Relevant Orders   MR Lumbar Spine Wo Contrast      Follow-up: Return for MRI Review.   CMA/ATC  served as Education administrator during this visit. History, Physical, and Plan performed by medical provider. Documentation and orders reviewed and attested to.      Teresa Coombs, [facility] Physician    08/05/2016 11:24 PM

## 2016-08-05 NOTE — Patient Instructions (Signed)

## 2016-08-05 NOTE — Assessment & Plan Note (Signed)
Patient does have multifactorial back and leg pain but this does seem most consistent with a radiculitis and given the ongoing symptoms, lack of improvement with home exercise program, anti-inflammatories, systemic corticosteroids and gabapentinoids further evaluation with MRI warranted.  We will plan to follow-up with her after this is obtained to discuss these results at her follow-up appointment.

## 2016-08-05 NOTE — Assessment & Plan Note (Signed)
She has persistent painful.  I suspect the underlying issue is glute medius dysfunction secondary to neurogenic component.

## 2016-08-07 ENCOUNTER — Telehealth: Payer: Self-pay | Admitting: Surgical

## 2016-08-07 ENCOUNTER — Telehealth: Payer: Self-pay | Admitting: Sports Medicine

## 2016-08-07 NOTE — Telephone Encounter (Signed)
Patient is calling in today to see about her MRI that was supposed to be scheduled. She states that she has not heard anything about scheduling yet. She said that she is now having tingling and numbness in the left foot. Please contact patient.

## 2016-08-07 NOTE — Telephone Encounter (Signed)
Dara from [facility] called in reference to [name] stating she needs authorization on her MRI.

## 2016-08-07 NOTE — Telephone Encounter (Signed)
Called pt and advised that precert has been done and approved and [facility] should be contacting her to schedule soon.

## 2016-08-07 NOTE — Telephone Encounter (Signed)
Spoke with [name], MRI have been approved valid 08/07/16-11/05/16. Auth # is [id number].

## 2016-08-10 ENCOUNTER — Other Ambulatory Visit: Payer: Self-pay | Admitting: Cardiovascular Disease

## 2016-08-11 NOTE — Assessment & Plan Note (Signed)
She does have a moderate degree of degenerative change within the lumbar spine.  If any lack of improvement with the above treatment consider further evaluation of her lumbar spine.

## 2016-08-11 NOTE — Assessment & Plan Note (Addendum)
Patient does have focal TTP with small bursal found on [facility] ultrasound today.  We will go ahead and inject this today and plan to follow-up with her.  If any lack of improvement consider further diagnostic evaluation of her lumbar spine.  ++++++++++++++++++++++++++++++++++++++++++++++++++++++++++++++++++  PROCEDURE NOTE -  ULTRASOUND GUIDEDINJECTION: Left Greater Trochanter Images were obtained and interpreted by myself, Teresa Coombs, DO  Images have been saved and stored to PACS system. Images obtained on: GE S7 Ultrasound machine  ULTRASOUND FINDINGS:  Tendoninopathic gluteal musculature over the greater trochanter with small bursal formation over the greater trochanter.  DESCRIPTION OF PROCEDURE:  The patient's clinical condition is marked by substantial pain and/or significant functional disability. Other conservative therapy has not provided relief, is contraindicated, or not appropriate. There is a reasonable likelihood that injection will significantly improve the patient's pain and/or functional impairment. After discussing the risks, benefits and expected outcomes of the injection and all questions were reviewed and answered, the patient wished to undergo the above named procedure. Verbal consent was obtained. The ultrasound was used to identify the target structure and adjacent neurovascular structures. The skin was then prepped in sterile fashion and the target structure was injected under direct visualization using sterile technique as below: PREP: Alcohol, Ethel Chloride APPROACH: Lateral, single injection, 22g 3.5" needle INJECTATE: 2 cc 0.5% marcaine, 2cc 40mg  DepoMedrol ASPIRATE: N/A DRESSING: Band-Aid  Post procedural instructions including recommending icing and warning signs for infection were reviewed. This procedure was well tolerated and there were no complications.   IMPRESSION: Succesful US Guided Injection

## 2016-08-11 NOTE — Assessment & Plan Note (Signed)
Given the underlying autoimmune condition if any lack of improvement consider further evaluation of systemic involvement.

## 2016-08-12 ENCOUNTER — Other Ambulatory Visit: Payer: Self-pay | Admitting: Cardiology

## 2016-08-12 DIAGNOSIS — I739 Peripheral vascular disease, unspecified: Secondary | ICD-10-CM

## 2016-08-15 ENCOUNTER — Ambulatory Visit (HOSPITAL_COMMUNITY)
Admission: RE | Admit: 2016-08-15 | Discharge: 2016-08-15 | Disposition: A | Discharge status: Home / Self Care | Payer: PRIVATE HEALTH INSURANCE | Source: Ambulatory Visit | Attending: Cardiovascular Disease | Admitting: Cardiovascular Disease

## 2016-08-15 ENCOUNTER — Telehealth: Payer: Self-pay | Admitting: Sports Medicine

## 2016-08-15 ENCOUNTER — Ambulatory Visit (HOSPITAL_BASED_OUTPATIENT_CLINIC_OR_DEPARTMENT_OTHER): Payer: PRIVATE HEALTH INSURANCE

## 2016-08-15 ENCOUNTER — Other Ambulatory Visit: Payer: Self-pay

## 2016-08-15 DIAGNOSIS — I739 Peripheral vascular disease, unspecified: Secondary | ICD-10-CM | POA: Diagnosis not present

## 2016-08-15 DIAGNOSIS — I340 Nonrheumatic mitral (valve) insufficiency: Secondary | ICD-10-CM

## 2016-08-15 NOTE — Telephone Encounter (Signed)
Forwarding to [facility] to follow-up.

## 2016-08-15 NOTE — Telephone Encounter (Signed)
Patient called to advise that she has not heard about her MRI referral and being scheduled. Please call patient to advise on if the referral was approved with insurance and provide the patient with the MRI office number so that she can contact them today.

## 2016-08-15 NOTE — Telephone Encounter (Signed)
Called and left voicemail for patient to call [facility] (also provided their phone number) after receiving approval from [facility], Teaching laboratory technician.

## 2016-08-22 ENCOUNTER — Telehealth: Payer: Self-pay | Admitting: Cardiology

## 2016-08-22 NOTE — Telephone Encounter (Signed)
Ptcb and has been notified of echo results by phone with verbal understanding to plan of care to repeat echo in 1 year. Pt thanked me for my call today.

## 2016-08-22 NOTE — Telephone Encounter (Signed)
New Message    Pt is returning [name] call from 08/22/16 for results

## 2016-08-29 ENCOUNTER — Other Ambulatory Visit: Payer: PRIVATE HEALTH INSURANCE | Admitting: *Deleted

## 2016-08-29 DIAGNOSIS — E785 Hyperlipidemia, unspecified: Secondary | ICD-10-CM

## 2016-08-29 DIAGNOSIS — Z79899 Other long term (current) drug therapy: Secondary | ICD-10-CM

## 2016-08-29 LAB — LIPID PANEL
Chol/HDL Ratio: 2.8 ratio (ref 0.0–4.4)
Cholesterol, Total: 154 mg/dL (ref 100–199)
HDL: 56 mg/dL (ref >39)
LDL Calculated: 82 mg/dL (ref 0–99)
Triglycerides: 81 mg/dL (ref 0–149)
VLDL Cholesterol Cal: 16 mg/dL (ref 5–40)

## 2016-08-29 LAB — HEPATIC FUNCTION PANEL
ALT: 16 IU/L (ref 0–32)
AST: 22 IU/L (ref 0–40)
Albumin: 3.6 g/dL (ref 3.6–4.8)
Alkaline Phosphatase: 78 IU/L (ref 39–117)
Bilirubin Total: 0.4 mg/dL (ref 0.0–1.2)
Bilirubin, Direct: 0.13 mg/dL (ref 0.00–0.40)
Total Protein: 6.1 g/dL (ref 6.0–8.5)

## 2016-08-31 ENCOUNTER — Ambulatory Visit
Admission: RE | Admit: 2016-08-31 | Discharge: 2016-08-31 | Disposition: A | Discharge status: Home / Self Care | Payer: PRIVATE HEALTH INSURANCE | Source: Ambulatory Visit | Attending: Sports Medicine | Admitting: Sports Medicine

## 2016-08-31 DIAGNOSIS — G8929 Other chronic pain: Secondary | ICD-10-CM

## 2016-08-31 DIAGNOSIS — M5442 Lumbago with sciatica, left side: Principal | ICD-10-CM

## 2016-09-01 ENCOUNTER — Telehealth: Payer: Self-pay

## 2016-09-01 NOTE — Telephone Encounter (Signed)
Called to give pt lab results and Brittainy S., PA recommendations.lmtcb

## 2016-09-01 NOTE — Telephone Encounter (Signed)
-----   Message from Consuelo Pandy, [place] sent at 09/01/2016 12:03 PM EDT ----- LDL is improved on low dose Lipitor but still not at goal at <70. Given her CAD history we need to get this number below 70. Her current level is 82. Recommend that she increase Lipitor to 20 mg nightly, if able to tolerate. Recheck FLP and HFTs in 8 weeks. If she is unable to tolerate higher dose of statin due to myalgias, then we need to refer to lipid clinic for further management.

## 2016-09-04 ENCOUNTER — Other Ambulatory Visit: Payer: Self-pay

## 2016-09-04 DIAGNOSIS — M519 Unspecified thoracic, thoracolumbar and lumbosacral intervertebral disc disorder: Secondary | ICD-10-CM

## 2016-09-13 ENCOUNTER — Other Ambulatory Visit: Payer: Self-pay | Admitting: Internal Medicine

## 2016-10-01 ENCOUNTER — Ambulatory Visit (INDEPENDENT_AMBULATORY_CARE_PROVIDER_SITE_OTHER): Payer: PRIVATE HEALTH INSURANCE

## 2016-10-01 ENCOUNTER — Ambulatory Visit (INDEPENDENT_AMBULATORY_CARE_PROVIDER_SITE_OTHER): Payer: PRIVATE HEALTH INSURANCE | Admitting: Physical Medicine and Rehabilitation

## 2016-10-01 ENCOUNTER — Encounter (INDEPENDENT_AMBULATORY_CARE_PROVIDER_SITE_OTHER): Payer: Self-pay | Admitting: Physical Medicine and Rehabilitation

## 2016-10-01 VITALS — BP 143/86 | HR 62

## 2016-10-01 DIAGNOSIS — G8929 Other chronic pain: Secondary | ICD-10-CM

## 2016-10-01 DIAGNOSIS — M5116 Intervertebral disc disorders with radiculopathy, lumbar region: Secondary | ICD-10-CM

## 2016-10-01 DIAGNOSIS — M5416 Radiculopathy, lumbar region: Secondary | ICD-10-CM | POA: Diagnosis not present

## 2016-10-01 DIAGNOSIS — M5442 Lumbago with sciatica, left side: Secondary | ICD-10-CM

## 2016-10-01 MED ORDER — LIDOCAINE HCL (PF) 1 % IJ SOLN
2.0000 mL | Freq: Once | INTRAMUSCULAR | Status: AC
  Administered 2016-10-01: 2 mL

## 2016-10-01 MED ORDER — METHYLPREDNISOLONE ACETATE 80 MG/ML IJ SUSP
80.0000 mg | Freq: Once | INTRAMUSCULAR | Status: AC
  Administered 2016-10-01: 80 mg

## 2016-10-01 NOTE — Patient Instructions (Signed)

## 2016-10-01 NOTE — Progress Notes (Deleted)
Pain across low back. Worse on left side. Radiates down back of  left leg to foot. Radiates down back of right leg to knee. Constant. Worse with walking,. Numbness and tingling in left foot.

## 2016-10-01 NOTE — Procedures (Signed)
S1 Lumbosacral Transforaminal Epidural Steroid Injection - Sub-Pedicular Approach with Fluoroscopic Guidance   Patient: [name]      Date of Birth: [birth year] MRN: [id number] PCP: Hoyt Koch, MD      Visit Date: 10/01/2016   [name] is a 61 year old female followed by Dr. Paulla Fore and requested diagnostic and therapeutic epidural steroid injection for her chronic worsening mostly left radicular leg pain in a classic S1 distribution. She has a left paracentral disc herniation at L5-S1. We are going to complete an S1 transforaminal epidural steroid injection. She also has pain consistent with facet mediated low back pain and I would have him consider facet joint blocks about how she does with the injection. On exam she has pain with extension of the lumbar spine. She has positive slump test to the left. She has good distal strength.  Universal Protocol:    Date/Time: 07/11/185:59 PM  Consent Given By: the patient  Position:  PRONE  Additional Comments: Vital signs were monitored before and after the procedure. Patient was prepped and draped in the usual sterile fashion. The correct patient, procedure, and site was verified.   Injection Procedure Details:  Procedure Site One Meds Administered:  Meds ordered this encounter  Medications  . lidocaine (PF) (XYLOCAINE) 1 % injection 2 mL  . methylPREDNISolone acetate (DEPO-MEDROL) injection 80 mg    Laterality: Left  Location/Site:  S1 Foramen   Needle size: 22 G  Needle type: Spinal  Needle Placement: Transforaminal  Findings:  -Contrast Used: 1 mL iohexol 180 mg iodine/mL   -Comments: Excellent flow of contrast along the nerve and into the epidural space.  Procedure Details: After squaring off the sacral end-plate to get a true AP view, the C-arm was positioned so that the best possible view of the S1 foramen was visualized. The soft tissues overlying this structure were infiltrated with 2-3 ml. of 1%  Lidocaine without Epinephrine.    The spinal needle was inserted toward the target using a "trajectory" view along the fluoroscope beam.  Under AP and lateral visualization, the needle was advanced so it did not puncture dura. Biplanar projections were used to confirm position. Aspiration was confirmed to be negative for CSF and/or blood. A 1-2 ml. volume of Isovue-250 was injected and flow of contrast was noted at each level. Radiographs were obtained for documentation purposes.   After attaining the desired flow of contrast documented above, a 0.5 to 1.0 ml test dose of 0.25% Marcaine was injected into each respective transforaminal space.  The patient was observed for 90 seconds post injection.  After no sensory deficits were reported, and normal lower extremity motor function was noted,   the above injectate was administered so that equal amounts of the injectate were placed at each foramen (level) into the transforaminal epidural space.   Additional Comments:  The patient tolerated the procedure well Dressing: Band-Aid    Post-procedure details: Patient was observed during the procedure. Post-procedure instructions were reviewed.  Patient left the clinic in stable condition.

## 2016-10-30 ENCOUNTER — Other Ambulatory Visit: Payer: PRIVATE HEALTH INSURANCE

## 2016-10-31 ENCOUNTER — Other Ambulatory Visit: Payer: Self-pay | Admitting: Internal Medicine

## 2016-11-03 ENCOUNTER — Other Ambulatory Visit: Payer: PRIVATE HEALTH INSURANCE | Admitting: *Deleted

## 2016-11-03 DIAGNOSIS — E7800 Pure hypercholesterolemia, unspecified: Secondary | ICD-10-CM

## 2016-11-03 LAB — HEPATIC FUNCTION PANEL
ALT: 9 IU/L (ref 0–32)
AST: 15 IU/L (ref 0–40)
Albumin: 3.7 g/dL (ref 3.6–4.8)
Alkaline Phosphatase: 79 IU/L (ref 39–117)
Bilirubin Total: 0.3 mg/dL (ref 0.0–1.2)
Bilirubin, Direct: 0.1 mg/dL (ref 0.00–0.40)
Total Protein: 6.4 g/dL (ref 6.0–8.5)

## 2016-11-03 LAB — LIPID PANEL
Chol/HDL Ratio: 3.5 ratio (ref 0.0–4.4)
Cholesterol, Total: 201 mg/dL — ABNORMAL HIGH (ref 100–199)
HDL: 58 mg/dL (ref >39)
LDL Calculated: 129 mg/dL — ABNORMAL HIGH (ref 0–99)
Triglycerides: 68 mg/dL (ref 0–149)
VLDL Cholesterol Cal: 14 mg/dL (ref 5–40)

## 2016-11-03 NOTE — Addendum Note (Signed)
Addended by: Eulis Foster on: 11/03/2016 11:55 AM   Modules accepted: Orders

## 2016-11-04 DIAGNOSIS — Z79899 Other long term (current) drug therapy: Secondary | ICD-10-CM | POA: Diagnosis not present

## 2016-11-04 DIAGNOSIS — M329 Systemic lupus erythematosus, unspecified: Secondary | ICD-10-CM | POA: Diagnosis not present

## 2016-11-04 DIAGNOSIS — Z6841 Body Mass Index (BMI) 40.0 and over, adult: Secondary | ICD-10-CM | POA: Diagnosis not present

## 2016-11-04 DIAGNOSIS — M3214 Glomerular disease in systemic lupus erythematosus: Secondary | ICD-10-CM | POA: Diagnosis not present

## 2016-11-06 ENCOUNTER — Telehealth: Payer: Self-pay | Admitting: Sports Medicine

## 2016-11-06 NOTE — Telephone Encounter (Signed)
Patient wants to do 3 series injection. She is having discomfort that started this week since last visit in May 2018.  Please advise.  Ty,  -LL

## 2016-11-07 NOTE — Telephone Encounter (Signed)
Called [phone] and left VM for pt to call the office.

## 2016-11-07 NOTE — Telephone Encounter (Signed)
Spoke with patient, she would like a referral to see Dr. Ernestina Patches again for another epidural injection. Her last injection for 10/02/2015. OK to refer?

## 2016-11-10 ENCOUNTER — Other Ambulatory Visit: Payer: Self-pay

## 2016-11-10 DIAGNOSIS — M519 Unspecified thoracic, thoracolumbar and lumbosacral intervertebral disc disorder: Secondary | ICD-10-CM

## 2016-11-10 NOTE — Telephone Encounter (Signed)
Yes, please refer back to  [facility]

## 2016-11-10 NOTE — Telephone Encounter (Signed)
Referral has been placed. 

## 2016-11-13 ENCOUNTER — Ambulatory Visit (INDEPENDENT_AMBULATORY_CARE_PROVIDER_SITE_OTHER): Payer: PRIVATE HEALTH INSURANCE | Admitting: Pharmacist

## 2016-11-13 ENCOUNTER — Encounter: Payer: Self-pay | Admitting: Pharmacist

## 2016-11-13 DIAGNOSIS — E7800 Pure hypercholesterolemia, unspecified: Secondary | ICD-10-CM | POA: Diagnosis not present

## 2016-11-13 MED ORDER — ROSUVASTATIN CALCIUM 20 MG PO TABS: 20.0000 mg | ORAL_TABLET | Freq: Every day | ORAL | 3 refills | Status: DC

## 2016-11-13 NOTE — Progress Notes (Signed)
Patient ID: [name]                 DOB: 1955/04/01                    MRN: [id number]     HPI: [name] is a 61 y.o. female referred by Dr. Angelena Adams to HTN clinic. PMH is significant for CAD (s/p BMS to RCA and LCx in 2003, PTCA to diagonal in 2003), ischemic cardiomyopathy (EF 45-50% by echo in 08/2014) moderate MR, HLD, GERD, hypothyroidism, SLE, PVD, and tobacco use. During her OV with [name] in 06/2016, pt reported that she had been off Crestor for the past two months due to cost. LDL at that time was 128. Low dose Lipitor 10 mg daily was started as pt was intolerant to higher doses in the past. Lipid panel from 08/29/2016 showed LDL improved to 82 while on low dose Lipitor but still not at goal so dose was increased to 20 mg daily. Lipid panel from 11/03/2016 showed LDL of 129. Pt reported that she stopped Lipitor 20 mg due to muscle aches, and the pain improved after stopping it. Most recent LFTs are WNL.    Pt presents to clinic in good spirits. She states that she self restarted Lipitor 10 mg daily about 3 days ago after being off of the 20 mg dose due to muscle aches. Pt reports that she has tried rosuvastatin 20mg  in the past and was tolerating it well and seeing good results, but she was paying around $170. Pt does not want to add any additional medications to her regimen. She would like to try generic rosuvastatin again.    Current Medications: Lipitor 10 mg daily   Intolerances: Lipitor 20 mg - muscle aches, Crestor - cost   Risk Factors: CAD, s/p BMS to RCA and LCx in 2003, PTCA to diagonal in 2003, ischemic cardiomyopathy, PVD  LDL goal: <70 mg/dL   Diet: Trying to cut back on red meats as well as trans fats and saturated fats. Eats a lot veggies and fruits diet. Eats a lot of nuts and fish. Eats out more than she cooks, but tries to get healthier options. Used to drink a lot of soda but trying to cut back.   Exercise: Pt's job requires her to do a lot of  walking. Does not really exercise outside of work.   Family History:  Mother - Breast and cervical cancer, Paget's disease of bone, fibromyalgia Father - HTN, heart disease(PVD), GERD Paternal grandfather - Colon cancer Grandmother - MI  Social History: Current smoker for the past 30 years, 0.50 PPD. Denies alcohol or illicit drug use.   Labs: 11/03/16: TC 201, TG 68, HDL 58, VLDL 14, LDL 129 (No medications) 08/29/16: TC 154, TG 81, HDL 56, VLDL 16, LDL 82 (Lipitor 10 mg daily)   Past Medical History:  Diagnosis Date  . Chronic ischemic heart disease, unspecified    w/ stents in mid circumflex and mid RCA in 03, and balloon angioplasty of Diagonal branch 10-03, last cath Sept 08- 2 vessel disease w/ patent stents  . Esophageal reflux   . Hypothyroidism   . Immune thrombocytopenic purpura ([place])   . Lupus erythematosus   . Mitral valve insufficiency and aortic valve insufficiency   . Myocardial infarction ([place])    x 2  . Peripheral vascular disease, unspecified ([place])   . Unspecified disease of pericardium     Current Outpatient Prescriptions on  File Prior to Visit  Medication Sig Dispense Refill  . aspirin 81 MG chewable tablet Chew 81 mg by mouth daily.     Marland Kitchen atenolol (TENORMIN) 25 MG tablet TAKE 1 TABLET(25 MG) BY MOUTH DAILY 90 tablet 3  . atorvastatin (LIPITOR) 10 MG tablet Take 1 tablet (10 mg total) by mouth daily. 90 tablet 3  . azaTHIOprine (IMURAN) 50 MG tablet Take 50 mg by mouth 2 (two) times daily.    . fluticasone (FLONASE) 50 MCG/ACT nasal spray Place 2 sprays into both nostrils daily. 48 g 1  . furosemide (LASIX) 40 MG tablet Take 40 mg by mouth daily as needed for edema.     . gabapentin (NEURONTIN) 300 MG capsule Start with 1 tab po qhs X 1 week, then increase to 1 tab po bid X 1 week then 1 tab po tid prn 90 capsule 1  . hydroxychloroquine (PLAQUENIL) 200 MG tablet Take 200 mg by mouth daily.     Marland Kitchen levothyroxine (SYNTHROID, LEVOTHROID) 50 MCG tablet Take 1 tablet  (50 mcg total) by mouth daily. Needs office visit with lab work for further refills 30 tablet 0  . losartan (COZAAR) 25 MG tablet Take 25 mg by mouth daily.    Marland Kitchen losartan (COZAAR) 25 MG tablet TAKE 1 TABLET(25 MG) BY MOUTH DAILY 30 tablet 11  . nitroGLYCERIN (NITROSTAT) 0.4 MG SL tablet Place 0.4 mg under the tongue every 5 (five) minutes x 3 doses as needed for chest pain.     . pantoprazole (PROTONIX) 40 MG tablet TAKE 1 TABLET(40 MG) BY MOUTH DAILY 90 tablet 3   No current facility-administered medications on file prior to visit.     No Known Allergies  Assessment/Plan:  1. Hyperlipidemia: Pt's most recent LDL is 129, still elevated above her goal of 70 mg/dL. Pt did tolerate Crestor 20 mg daily well, but it was cost prohibitive, paying about ~$170. She was not sure if she tried the brand name or generic rosuvastatin. Will send new rx for generic rosuvastatin to her pharmacy. If price is still high, plan to call insurance company to do a tier reduction. F/u with pt regarding results and schedule follow up visit and lab work at that time.   Pt copay will be $140 - will submit for a tier exception. Pt aware.   -[name], PharmD Student   Thank you, [name], [place]  [address] [place], [place] [address]  Phone: [phone]; Fax: [phone] 11/13/2016 4:57 PM

## 2016-11-13 NOTE — Patient Instructions (Addendum)
It was good seeing you today.  Your LDL cholesterol is elevated.   Start rosuvastatin 20 mg 1 tablet by mouth once daily  Please contact the clinic at [phone] if you experience any muscle/joint pain or if you notice any abnormal side effects.

## 2016-11-18 ENCOUNTER — Telehealth: Payer: Self-pay | Admitting: Pharmacist

## 2016-11-18 NOTE — Telephone Encounter (Signed)
[employer] rep called - stated a PA was done for rosuvastatin rather than a tier exception. Asked which statins are preferred on pt's insurance and she stated rosuvastatin is a tier 1. Pharmacy had previously been called and copay was $140 for a 3 month supply. Insurance rep stated 1 month or 3 month copay should only be $20. Called pharmacy and they weren't using patient's correct insurance. Provided them with this and alerted pt that her 3 month copay is $20. She will pick up rx today.

## 2016-11-25 ENCOUNTER — Ambulatory Visit (INDEPENDENT_AMBULATORY_CARE_PROVIDER_SITE_OTHER): Payer: PRIVATE HEALTH INSURANCE | Admitting: Physical Medicine and Rehabilitation

## 2016-11-25 ENCOUNTER — Ambulatory Visit (INDEPENDENT_AMBULATORY_CARE_PROVIDER_SITE_OTHER): Payer: Self-pay

## 2016-11-25 ENCOUNTER — Encounter (INDEPENDENT_AMBULATORY_CARE_PROVIDER_SITE_OTHER): Payer: Self-pay | Admitting: Physical Medicine and Rehabilitation

## 2016-11-25 VITALS — BP 130/83 | HR 68

## 2016-11-25 DIAGNOSIS — M5116 Intervertebral disc disorders with radiculopathy, lumbar region: Secondary | ICD-10-CM

## 2016-11-25 DIAGNOSIS — M5416 Radiculopathy, lumbar region: Secondary | ICD-10-CM

## 2016-11-25 MED ORDER — METHYLPREDNISOLONE ACETATE 80 MG/ML IJ SUSP
80.0000 mg | Freq: Once | INTRAMUSCULAR | Status: AC
  Administered 2016-11-25: 80 mg

## 2016-11-25 MED ORDER — LIDOCAINE HCL (PF) 1 % IJ SOLN
2.0000 mL | Freq: Once | INTRAMUSCULAR | Status: AC
  Administered 2016-11-25: 2 mL

## 2016-11-25 NOTE — Patient Instructions (Signed)

## 2016-11-25 NOTE — Progress Notes (Deleted)
Patient states she had great relief with last injection. Pain free for 5 to 6 weeks. Pain has just started to return again. Left side back pain radiating down to foot. Numbness and tingling in left foot.

## 2016-11-28 NOTE — Procedures (Signed)
[name] this is a 61 year old female with disc herniation at L5-S1 with pretty classic S1-type symptoms. Prior S1 injection gave her a couple months of good relief of that slowly returning symptoms. She is follow-up with Dr. Paulla Fore. We will repeat the injection today.  S1 Lumbosacral Transforaminal Epidural Steroid Injection - Sub-Pedicular Approach with Fluoroscopic Guidance   Patient: [name]      Date of Birth: [birth year] MRN: [id number] PCP: Hoyt Koch, MD      Visit Date: 11/25/2016   Universal Protocol:    Date/Time: 09/07/186:07 AM  Consent Given By: the patient  Position:  PRONE  Additional Comments: Vital signs were monitored before and after the procedure. Patient was prepped and draped in the usual sterile fashion. The correct patient, procedure, and site was verified.   Injection Procedure Details:  Procedure Site One Meds Administered:  Meds ordered this encounter  Medications  . lidocaine (PF) (XYLOCAINE) 1 % injection 2 mL  . methylPREDNISolone acetate (DEPO-MEDROL) injection 80 mg    Laterality: Left  Location/Site:  S1 Foramen   Needle size: 22 G  Needle type: Spinal  Needle Placement: Transforaminal  Findings:  -Contrast Used: 0.5 mL iohexol 180 mg iodine/mL   -Comments: Excellent flow of contrast along the nerve and into the epidural space.  Procedure Details: After squaring off the sacral end-plate to get a true AP view, the C-arm was positioned so that the best possible view of the S1 foramen was visualized. The soft tissues overlying this structure were infiltrated with 2-3 ml. of 1% Lidocaine without Epinephrine.    The spinal needle was inserted toward the target using a "trajectory" view along the fluoroscope beam.  Under AP and lateral visualization, the needle was advanced so it did not puncture dura. Biplanar projections were used to confirm position. Aspiration was confirmed to be negative for CSF and/or blood. A 1-2  ml. volume of Isovue-250 was injected and flow of contrast was noted at each level. Radiographs were obtained for documentation purposes.   After attaining the desired flow of contrast documented above, a 0.5 to 1.0 ml test dose of 0.25% Marcaine was injected into each respective transforaminal space.  The patient was observed for 90 seconds post injection.  After no sensory deficits were reported, and normal lower extremity motor function was noted,   the above injectate was administered so that equal amounts of the injectate were placed at each foramen (level) into the transforaminal epidural space.   Additional Comments:  The patient tolerated the procedure well Dressing: Band-Aid    Post-procedure details: Patient was observed during the procedure. Post-procedure instructions were reviewed.  Patient left the clinic in stable condition.

## 2016-12-11 ENCOUNTER — Other Ambulatory Visit: Payer: Self-pay | Admitting: Internal Medicine

## 2017-01-16 ENCOUNTER — Telehealth: Payer: Self-pay | Admitting: Internal Medicine

## 2017-01-17 ENCOUNTER — Encounter (HOSPITAL_COMMUNITY): Payer: Self-pay | Admitting: Emergency Medicine

## 2017-01-17 ENCOUNTER — Emergency Department (HOSPITAL_COMMUNITY): Payer: BLUE CROSS/BLUE SHIELD

## 2017-01-17 ENCOUNTER — Emergency Department (HOSPITAL_COMMUNITY)
Admission: EM | Admit: 2017-01-17 | Discharge: 2017-01-17 | Disposition: A | Discharge status: Home / Self Care | Payer: BLUE CROSS/BLUE SHIELD | Attending: Emergency Medicine | Admitting: Emergency Medicine

## 2017-01-17 DIAGNOSIS — R072 Precordial pain: Secondary | ICD-10-CM | POA: Diagnosis not present

## 2017-01-17 DIAGNOSIS — R079 Chest pain, unspecified: Secondary | ICD-10-CM | POA: Diagnosis not present

## 2017-01-17 DIAGNOSIS — R6884 Jaw pain: Secondary | ICD-10-CM | POA: Insufficient documentation

## 2017-01-17 DIAGNOSIS — M542 Cervicalgia: Secondary | ICD-10-CM | POA: Insufficient documentation

## 2017-01-17 DIAGNOSIS — R110 Nausea: Secondary | ICD-10-CM | POA: Insufficient documentation

## 2017-01-17 DIAGNOSIS — Z79899 Other long term (current) drug therapy: Secondary | ICD-10-CM | POA: Diagnosis not present

## 2017-01-17 DIAGNOSIS — E039 Hypothyroidism, unspecified: Secondary | ICD-10-CM | POA: Diagnosis not present

## 2017-01-17 DIAGNOSIS — Z7982 Long term (current) use of aspirin: Secondary | ICD-10-CM | POA: Diagnosis not present

## 2017-01-17 DIAGNOSIS — I2510 Atherosclerotic heart disease of native coronary artery without angina pectoris: Secondary | ICD-10-CM | POA: Insufficient documentation

## 2017-01-17 DIAGNOSIS — I252 Old myocardial infarction: Secondary | ICD-10-CM | POA: Diagnosis not present

## 2017-01-17 DIAGNOSIS — F17210 Nicotine dependence, cigarettes, uncomplicated: Secondary | ICD-10-CM | POA: Diagnosis not present

## 2017-01-17 LAB — BASIC METABOLIC PANEL
Anion gap: 7 (ref 5–15)
BUN: 11 mg/dL (ref 6–20)
CO2: 24 mmol/L (ref 22–32)
Calcium: 8.5 mg/dL — ABNORMAL LOW (ref 8.9–10.3)
Chloride: 108 mmol/L (ref 101–111)
Creatinine, Ser: 0.95 mg/dL (ref 0.44–1.00)
GFR calc Af Amer: >60 mL/min (ref >60)
GFR calc non Af Amer: >60 mL/min (ref >60)
Glucose, Bld: 98 mg/dL (ref 65–99)
Potassium: 3.8 mmol/L (ref 3.5–5.1)
Sodium: 139 mmol/L (ref 135–145)

## 2017-01-17 LAB — CBC
HCT: 37.3 % (ref 36.0–46.0)
Hemoglobin: 12.9 g/dL (ref 12.0–15.0)
MCH: 34.6 pg — ABNORMAL HIGH (ref 26.0–34.0)
MCHC: 34.6 g/dL (ref 30.0–36.0)
MCV: 100 fL (ref 78.0–100.0)
Platelets: 179 10*3/uL (ref 150–400)
RBC: 3.73 MIL/uL — ABNORMAL LOW (ref 3.87–5.11)
RDW: 15.1 % (ref 11.5–15.5)
WBC: 3.7 10*3/uL — ABNORMAL LOW (ref 4.0–10.5)

## 2017-01-17 LAB — I-STAT TROPONIN, ED
Troponin i, poc: 0.04 ng/mL (ref 0.00–0.08)
Troponin i, poc: 0.03 ng/mL (ref 0.00–0.08)

## 2017-01-17 MED ORDER — LOSARTAN POTASSIUM 25 MG PO TABS
25.0000 mg | ORAL_TABLET | Freq: Once | ORAL | Status: AC
Start: 2017-01-17 — End: 2017-01-17
  Administered 2017-01-17: 25 mg via ORAL
  Filled 2017-01-17: qty 1

## 2017-01-17 MED ORDER — ACETAMINOPHEN 500 MG PO TABS
1000.0000 mg | ORAL_TABLET | Freq: Once | ORAL | Status: AC
  Administered 2017-01-17: 1000 mg via ORAL
  Filled 2017-01-17: qty 2

## 2017-01-17 MED ORDER — METHOCARBAMOL 750 MG PO TABS: 750.0000 mg | ORAL_TABLET | Freq: Four times a day (QID) | ORAL | 0 refills | Status: DC

## 2017-01-17 MED ORDER — ASPIRIN 81 MG PO CHEW
324.0000 mg | CHEWABLE_TABLET | Freq: Once | ORAL | Status: AC
  Administered 2017-01-17: 324 mg via ORAL
  Filled 2017-01-17: qty 4

## 2017-01-17 MED ORDER — PANTOPRAZOLE SODIUM 40 MG PO TBEC
40.0000 mg | DELAYED_RELEASE_TABLET | Freq: Once | ORAL | Status: AC
  Administered 2017-01-17: 40 mg via ORAL
  Filled 2017-01-17: qty 1

## 2017-01-17 MED ORDER — NITROGLYCERIN 0.4 MG SL SUBL
0.4000 mg | SUBLINGUAL_TABLET | SUBLINGUAL | Status: DC | PRN
  Filled 2017-01-17: qty 1

## 2017-01-17 MED ORDER — IOPAMIDOL (ISOVUE-370) INJECTION 76%
100.0000 mL | Freq: Once | INTRAVENOUS | Status: AC | PRN
  Administered 2017-01-17: 100 mL via INTRAVENOUS

## 2017-01-17 MED ORDER — IOPAMIDOL (ISOVUE-370) INJECTION 76%
INTRAVENOUS | Status: AC
  Filled 2017-01-17: qty 100

## 2017-01-17 MED ORDER — METHOCARBAMOL 1000 MG/10ML IJ SOLN: 500.0000 mg | Freq: Once | INTRAMUSCULAR | Status: DC

## 2017-01-17 MED ORDER — ACETAMINOPHEN 500 MG PO TABS: 1000.0000 mg | ORAL_TABLET | Freq: Four times a day (QID) | ORAL | 0 refills | Status: DC | PRN

## 2017-01-17 MED ORDER — DEXTROSE 5 % IV SOLN
500.0000 mg | Freq: Once | INTRAVENOUS | Status: AC
  Administered 2017-01-17: 500 mg via INTRAVENOUS
  Filled 2017-01-17: qty 550

## 2017-01-17 NOTE — ED Provider Notes (Signed)
[facility] DEPT Provider Note   CSN: [id number] Arrival date & time: 01/17/17  0730     History   Chief Complaint Chief Complaint  Patient presents with  . Chest Pain    HPI TEKLA MALACHOWSKI is a 61 y.o. female.  HPI Has been having left jaw pain, aching shoulders and pain in the neck for the past week.  This morning patient reports nausea and some chest discomfort.  Patient has history of MI.  She reports neck and shoulder pain does not seem like typical musculoskeletal pain.  She has history of lupus but reports most pain that she experiences with that is joint pain.  No fever no cough.  Some shortness of breath with exertion.  No lower extremity swelling or calf pain. Past Medical History:  Diagnosis Date  . Chronic ischemic heart disease, unspecified    w/ stents in mid circumflex and mid RCA in 03, and balloon angioplasty of Diagonal branch 10-03, last cath Sept 08- 2 vessel disease w/ patent stents  . Esophageal reflux   . Hypothyroidism   . Immune thrombocytopenic purpura ([place])   . Lupus erythematosus   . Mitral valve insufficiency and aortic valve insufficiency   . Myocardial infarction ([place])    x 2  . Peripheral vascular disease, unspecified ([place])   . Unspecified disease of pericardium     Patient Active Problem List   Diagnosis Date Noted  . Osteoarthritis of spine with radiculopathy, lumbar region 06/24/2016  . Pain in the chest 11/18/2015  . Hypothyroidism 04/08/2011  . CAD (coronary artery disease), native coronary artery 02/26/2009  . Mitral valve disease 02/26/2009  . Hyperlipidemia 01/26/2009  . Immune thrombocytopenic purpura ([place]) 08/05/2007  . DVT 08/05/2007  . GERD 08/05/2007  . Systemic lupus erythematosus with focal and segmental proliferative glomerulonephritis ([place]) 08/05/2007    Past Surgical History:  Procedure Laterality Date  . CHOLECYSTECTOMY, LAPAROSCOPIC  2010  . plastic surgical repair      dog bite  to leg  . SPLENECTOMY  5-04    OB History    No data available       Home Medications    Prior to Admission medications   Medication Sig Start Date End Date Taking? Authorizing Provider  acetaminophen (TYLENOL) 500 MG tablet Take 1,000 mg by mouth every 6 (six) hours as needed for mild pain.   Yes [provider]  aspirin 81 MG chewable tablet Chew 81 mg by mouth daily.    Yes [provider]  Aspirin-Acetaminophen-Caffeine (GOODY HEADACHE PO) Take 1 Package by mouth daily as needed (pain).   Yes [provider]  atenolol (TENORMIN) 25 MG tablet TAKE 1 TABLET(25 MG) BY MOUTH DAILY 08/11/16  Yes Burnell Blanks, MD  azaTHIOprine (IMURAN) 50 MG tablet Take 50 mg by mouth 2 (two) times daily.   Yes [provider]  calcium carbonate (TUMS - DOSED IN MG ELEMENTAL CALCIUM) 500 MG chewable tablet Chew 1 tablet by mouth 2 (two) times daily with a meal.    Yes [provider]  hydroxychloroquine (PLAQUENIL) 200 MG tablet Take 200 mg by mouth daily.    Yes [provider]  levothyroxine (SYNTHROID, LEVOTHROID) 50 MCG tablet TAKE 1 TABLET(50 MCG) BY MOUTH DAILY 12/12/16  Yes Hoyt Koch, MD  losartan (COZAAR) 25 MG tablet TAKE 1 TABLET(25 MG) BY MOUTH DAILY 07/24/16  Yes Burnell Blanks, MD  nitroGLYCERIN (NITROSTAT) 0.4 MG SL tablet Place 0.4 mg under the  tongue every 5 (five) minutes x 3 doses as needed for chest pain.    Yes [provider]  pantoprazole (PROTONIX) 40 MG tablet TAKE 1 TABLET(40 MG) BY MOUTH DAILY 08/04/16  Yes Burnell Blanks, MD  rosuvastatin (CRESTOR) 20 MG tablet Take 1 tablet (20 mg total) by mouth daily. 11/13/16 02/11/17 Yes Burnell Blanks, MD  acetaminophen (TYLENOL) 500 MG tablet Take 2 tablets (1,000 mg total) by mouth every 6 (six) hours as needed. 01/17/17   Charlesetta Shanks, MD  fluticasone (FLONASE) 50 MCG/ACT nasal spray Place 2 sprays into both nostrils  daily. Patient not taking: Reported on 01/17/2017 04/17/16   Hoyt Koch, MD  gabapentin (NEURONTIN) 300 MG capsule Start with 1 tab po qhs X 1 week, then increase to 1 tab po bid X 1 week then 1 tab po tid prn Patient not taking: Reported on 01/17/2017 06/24/16   Gerda Diss, DO  methocarbamol (ROBAXIN-750) 750 MG tablet Take 1 tablet (750 mg total) by mouth 4 (four) times daily. 01/17/17   Charlesetta Shanks, MD    Family History Family History  Problem Relation Age of Onset  . Cancer Mother        breast and cervical  . Paget's disease of bone Mother   . Fibromyalgia Mother   . Hypertension Father   . Heart disease Father        PVD  . GER disease Father   . Cancer Paternal Grandfather        colon  . Heart attack Other   . Coronary artery disease Other   . Diabetes Other   . Hypertension Other   . Hyperlipidemia Other     Social History Social History  Substance Use Topics  . Smoking status: Current Some Day Smoker    Packs/day: 0.50    Years: 30.00    Types: Cigarettes    Last attempt to quit: 12/23/2011  . Smokeless tobacco: Never Used  . Alcohol use No     Allergies   Patient has no known allergies.   Review of Systems Review of Systems 10 Systems reviewed and are negative for acute change except as noted in the HPI.   Physical Exam Updated Vital Signs BP (!) 147/88 (BP Location: Left Arm)   Pulse (!) 58   Temp 98.2 F (36.8 C) (Oral)   Resp (!) 21   SpO2 96%   Physical Exam  Constitutional: She is oriented to person, place, and time. She appears well-developed and well-nourished. No distress.  HENT:  Head: Normocephalic and atraumatic.  Right Ear: External ear normal.  Left Ear: External ear normal.  Nose: Nose normal.  Mouth/Throat: Oropharynx is clear and moist.  Left TM normal.  Ear canal clear.  No facial swelling.  No evident dental decay or gum swelling.  Neck is supple.  Patient does endorse discomfort to palpation on the  posterior paracervical muscle [place] in the trapezius.  Eyes: Conjunctivae are normal.  Neck: Neck supple.  Cardiovascular: Normal rate and regular rhythm.   No murmur heard. Pulmonary/Chest: Effort normal and breath sounds normal. No respiratory distress.  Abdominal: Soft. She exhibits no distension. There is no tenderness. There is no guarding.  Musculoskeletal: Normal range of motion. She exhibits no edema, tenderness or deformity.  Neurological: She is alert and oriented to person, place, and time. No cranial nerve deficit. She exhibits normal muscle tone. Coordination normal.  Skin: Skin is warm and dry.  Psychiatric: She has a normal  mood and affect.  Nursing note and vitals reviewed.    ED Treatments / Results  Labs (all labs ordered are listed, but only abnormal results are displayed) Labs Reviewed  BASIC METABOLIC PANEL - Abnormal; Notable for the following:       Result Value   Calcium 8.5 (*)    All other components within normal limits  CBC - Abnormal; Notable for the following:    WBC 3.7 (*)    RBC 3.73 (*)    MCH 34.6 (*)    All other components within normal limits  I-STAT TROPONIN, ED  I-STAT TROPONIN, ED    EKG  EKG Interpretation  Date/Time:  Saturday January 17 2017 07:36:02 EDT Ventricular Rate:  62 PR Interval:    QRS Duration: 92 QT Interval:  462 QTC Calculation: 470 R Axis:   37 Text Interpretation:  Sinus rhythm Probable left atrial enlargement Low voltage, precordial leads Confirmed by Charlesetta Shanks [phone]) on 01/17/2017 7:48:34 AM Also confirmed by Charlesetta Shanks [phone]), editor Philomena Doheny [phone])  on 01/17/2017 9:12:14 AM       Radiology Dg Chest 2 View  Result Date: 01/17/2017 CLINICAL DATA:  Chest pain. EXAM: CHEST  2 VIEW COMPARISON:  Chest x-ray dated March 08, 2016. FINDINGS: Mild cardiomegaly. Normal pulmonary vascularity. No focal consolidation, pleural effusion, or pneumothorax. No acute osseous abnormality.  IMPRESSION: Mild cardiomegaly.  No active cardiopulmonary disease. Electronically Signed   By: Titus Dubin M.D.   On: 01/17/2017 09:02   Ct Angio Chest/abd/pel For Dissection W And/or W/wo  Result Date: 01/17/2017 CLINICAL DATA:  Per pt, states face, neck, shoulder and chest pain that started 2 days ago-states history of MI-states dizzy and nauseated. History of Lupus EXAM: CT ANGIOGRAPHY CHEST, ABDOMEN AND PELVIS TECHNIQUE: Multidetector CT imaging through the chest, abdomen and pelvis was performed using the standard protocol during bolus administration of intravenous contrast. Multiplanar reconstructed images and MIPs were obtained and reviewed to evaluate the vascular anatomy. CONTRAST:  100 cc Isovue 370 COMPARISON:  CT abdomen dated 03/09/2016. CT chest dated 05/28/2005. FINDINGS: CTA CHEST FINDINGS Cardiovascular: Mild aortic atherosclerosis. No thoracic aortic aneurysm or dissection. No evidence of ulcerated plaque. Heart size is within normal limits. No pericardial effusion. No central obstructing pulmonary embolism seen. Mediastinum/Nodes: Esophagus is unremarkable. No mass or enlarged lymph nodes seen within the mediastinum or perihilar regions. Trachea and central bronchi are unremarkable. Lungs/Pleura: Stable scarring/fibrosis at the right lung apex. Mild scarring/atelectasis at each lung base. At least some degree of air trapping bilaterally. No evidence of pneumonia. No pleural effusion or pneumothorax. Musculoskeletal: No acute or suspicious osseous finding. No significant degenerative change appreciated within the thoracic spine. Review of the MIP images confirms the above findings. CTA ABDOMEN AND PELVIS FINDINGS VASCULAR Aorta: Aortic atherosclerosis.  No aortic aneurysm or dissection. Celiac: Patent without evidence of aneurysm, dissection, vasculitis or significant stenosis. SMA: Approximately 50% narrowing at the origin of the SMA, most likely related to noncalcified atherosclerotic  plaque at the origin, less likely focal eccentric dissection. Normal flow within the distal SMA. Renals: Both renal arteries are patent without evidence of aneurysm, dissection, vasculitis, fibromuscular dysplasia or significant stenosis. IMA: Patent without evidence of aneurysm, dissection, vasculitis or significant stenosis. Inflow: Patent without evidence of aneurysm, dissection, vasculitis or significant stenosis. Veins: No obvious venous abnormality within the limitations of this arterial phase study. Review of the MIP images confirms the above findings. NON-VASCULAR Hepatobiliary: No focal liver abnormality is seen. Status post cholecystectomy. No biliary  dilatation. Pancreas: Unremarkable. No pancreatic ductal dilatation or surrounding inflammatory changes. Spleen: Status post splenectomy. Small residual splenic remnant within the left upper quadrant. Adrenals/Urinary Tract: Adrenal glands appear normal. Kidneys are unremarkable without mass, stone or hydronephrosis. No renal or ureteral calculi. Bladder appears normal, partially decompressed. Stomach/Bowel: Bowel is normal in caliber. No bowel wall thickening or evidence of bowel wall inflammation seen. Appendix is normal. Stomach appears normal. Lymphatic: No significant vascular findings are present. No enlarged abdominal or pelvic lymph nodes. Reproductive: Uterus and bilateral adnexa are unremarkable. Other: No free fluid or abscess collection. No free intraperitoneal air. Musculoskeletal: Mild degenerative change within the lumbar spine. No acute or suspicious osseous finding. Small left inguinal hernia which contains fat only. Superficial soft tissues are otherwise unremarkable. Review of the MIP images confirms the above findings. IMPRESSION: 1. No acute intrathoracic abnormality. No thoracic aortic aneurysm or dissection. Heart size is normal. No pericardial effusion. No pneumonia or pulmonary edema. 2. Approximately 50% narrowing at the origin of  the superior mesenteric artery (SMA), most likely due to chronic noncalcified atherosclerotic plaque at the origin, focal eccentric dissection is considered much less likely. Normal contrast flow is seen into the mid and distal SMA. Consider vascular surgery consultation for further workup and/or follow-up considerations. No other significant vascular findings. No vascular obstruction or convincing dissection. 3. No acute appearing findings within the abdomen or pelvis. Chronic/incidental findings detailed above. 4. Aortic atherosclerosis. Electronically Signed   By: Franki Cabot M.D.   On: 01/17/2017 10:34    Procedures Procedures (including critical care time)  Medications Ordered in ED Medications  nitroGLYCERIN (NITROSTAT) SL tablet 0.4 mg (not administered)  iopamidol (ISOVUE-370) 76 % injection (not administered)  losartan (COZAAR) tablet 25 mg (25 mg Oral Given 01/17/17 0843)  pantoprazole (PROTONIX) EC tablet 40 mg (40 mg Oral Given 01/17/17 0843)  aspirin chewable tablet 324 mg (324 mg Oral Given 01/17/17 0843)  acetaminophen (TYLENOL) tablet 1,000 mg (1,000 mg Oral Given 01/17/17 0842)  methocarbamol (ROBAXIN) 500 mg in dextrose 5 % 50 mL IVPB (0 mg Intravenous Stopped 01/17/17 1018)  iopamidol (ISOVUE-370) 76 % injection 100 mL (100 mLs Intravenous Contrast Given 01/17/17 1000)     Initial Impression / Assessment and Plan / ED Course  I have reviewed the triage vital signs and the nursing notes.  Pertinent labs & imaging results that were available during my care of the patient were reviewed by me and considered in my medical decision making (see chart for details).  Clinical Course as of Jan 17 1225  Sat Jan 17, 2017  0946 Consult to cardiology: Dr.Tamnitz.  Consult will be done in the ED.  [MP]    Clinical Course User Index [MP] Charlesetta Shanks, MD    Consult: Cardiology Dr. Wynonia Lawman has seen in the emergency department.  Final Clinical Impressions(s) / ED Diagnoses    Final diagnoses:  Cervical pain (neck)  Precordial pain  Jaw pain  Patient presents as outlined above.  He has significant cardiovascular history and vascular disease.  CT was obtained to rule out dissection given patient having pain up between her shoulders into her neck jaw.  The study was normal.  2 sets of cardiac enzymes done which were both normal.  Patient has had pain for a week.  It has been predominantly continuous in nature, atypical for cardiac ischemia.  Will follow up with her cardiologist on outpatient basis to determine additional testing.  Return precautions are reviewed.  Patient did have significantly reproducible  pain in the paraspinous and trapezius muscle bodies.  At this time I do suspect musculoskeletal pain as the etiology.  Patient will be treated with Robaxin and acetaminophen.  These did give her good pain relief in the emergency department.  His blood pressures been slightly elevated above her normal baseline but not hypertensive urgency.  Patient is counseled on an additional 25 mg tablet of Cozaar daily, she is to continue monitoring her blood pressures.  New Prescriptions New Prescriptions   ACETAMINOPHEN (TYLENOL) 500 MG TABLET    Take 2 tablets (1,000 mg total) by mouth every 6 (six) hours as needed.   METHOCARBAMOL (ROBAXIN-750) 750 MG TABLET    Take 1 tablet (750 mg total) by mouth 4 (four) times daily.     Charlesetta Shanks, MD 01/17/17 1230

## 2017-01-17 NOTE — ED Notes (Signed)
Pt request not to take NTG

## 2017-01-17 NOTE — Discharge Instructions (Signed)
1.  Increase your daily dose of Cozaar by 25 mg. 2.  Return  to the emergency department if you have new, worsening or changing symptoms.

## 2017-01-17 NOTE — Consult Note (Signed)
Cardiology Consult Note  Admit date: 01/17/2017 Name: [name] 61 y.o.  female DOB:  1955/09/20 MRN:  [id number]  Today's date:  01/17/2017  Referring Physician:    [facility]  Primary Physician:   Dr. Teresa Coombs  Reason for Consultation:   Chest pain  IMPRESSIONS: 1.  Chest shoulder and neck pain that is somewhat atypical for cardiac etiology but with prior history of CAD and multiple risk factors 2.  CAD with previous inferior infarction with stenting previously in the circumflex and right coronary artery and PCI of diagonal branch with the sites previously patent at catheterization 10 years ago-last ischemic evaluation 3 years ago 3.  Hypertensive heart disease 4.  Moderate mitral regurgitation 5.  History of ITP treated with splenectomy 6.  Obesity 7.  Systemic lupus erythematosus treated with azathioprine and Plaquenil 8.  Headache  RECOMMENDATION: Her EKG is totally normal.  Initial troponin is not severely elevated.  Symptoms have some atypical features to them.  Not unreasonable to cycle troponins and if negative could have outpatient follow-up with her primary cardiologist.  If troponins are abnormal or continues with symptoms then may need admission by hospitalist with catheterization if troponins are abnormal.  HISTORY: This 61 year old female has a history of an inferior infarction 2003 treated with a bare metal stent.  Subsequent catheterization showed the stent sites in the right coronary artery and circumflex to be patent as well as the diagonal branch.  She has had intermittent chest discomfort through the years and her last myocardial perfusion scan was in 2015 and showed no ischemia.  She has known lupus and has had a renal component to it and is currently followed by nephrology as well as rheumatology.  She has chronic aching.  She previously has been intolerant to statins but is currently able to take Crestor.  She continues to smoke a mild  amount.  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  She is had what she calls panic attacks over the past couple of months that she describes as back and shoulder pain.  Over the past week she has had some shoulder pain and jaw pain and some neck discomfort not related to exertion.  It will occur for variable periods of time.  She had a significant headache and also nausea and did not feel well and was worried about her cardiac status and presented to the emergency room this morning.  Initial EKG was unremarkable and a CT and a showed no evidence of dissection.  She didn't have evidence of aortic atherosclerosis.  She does not have exertional type chest pain.  She has significant chronic back pain is had a recent series of epidural steroid injections.  Past Medical History:  Diagnosis Date  . Chronic ischemic heart disease, unspecified    w/ stents in mid circumflex and mid RCA in 03, and balloon angioplasty of Diagonal branch 10-03, last cath Sept 08- 2 vessel disease w/ patent stents  . Esophageal reflux   . Hypothyroidism   . Immune thrombocytopenic purpura ([place])   . Lupus erythematosus   . Mitral valve insufficiency and aortic valve insufficiency   . Myocardial infarction ([place])    x 2  . Peripheral vascular disease, unspecified ([place])   . Unspecified disease of pericardium       Past Surgical History:  Procedure Laterality Date  . CHOLECYSTECTOMY, LAPAROSCOPIC  2010  . plastic  surgical repair      dog bite to leg  . SPLENECTOMY  5-04    Allergies:  has No Known Allergies.   Medications: Prior to Admission medications   Medication Sig Start Date End Date Taking? Authorizing Provider  acetaminophen (TYLENOL) 500 MG tablet Take 1,000 mg by mouth every 6 (six) hours as needed for mild pain.   Yes [provider]  aspirin 81 MG chewable tablet Chew 81 mg by mouth daily.    Yes [provider]   Aspirin-Acetaminophen-Caffeine (GOODY HEADACHE PO) Take 1 Package by mouth daily as needed (pain).   Yes [provider]  atenolol (TENORMIN) 25 MG tablet TAKE 1 TABLET(25 MG) BY MOUTH DAILY 08/11/16  Yes Burnell Blanks, MD  azaTHIOprine (IMURAN) 50 MG tablet Take 50 mg by mouth 2 (two) times daily.   Yes [provider]  calcium carbonate (TUMS - DOSED IN MG ELEMENTAL CALCIUM) 500 MG chewable tablet Chew 1 tablet by mouth 2 (two) times daily with a meal.    Yes [provider]  hydroxychloroquine (PLAQUENIL) 200 MG tablet Take 200 mg by mouth daily.    Yes [provider]  levothyroxine (SYNTHROID, LEVOTHROID) 50 MCG tablet TAKE 1 TABLET(50 MCG) BY MOUTH DAILY 12/12/16  Yes Hoyt Koch, MD  losartan (COZAAR) 25 MG tablet TAKE 1 TABLET(25 MG) BY MOUTH DAILY 07/24/16  Yes Burnell Blanks, MD  nitroGLYCERIN (NITROSTAT) 0.4 MG SL tablet Place 0.4 mg under the tongue every 5 (five) minutes x 3 doses as needed for chest pain.    Yes [provider]  pantoprazole (PROTONIX) 40 MG tablet TAKE 1 TABLET(40 MG) BY MOUTH DAILY 08/04/16  Yes Burnell Blanks, MD  rosuvastatin (CRESTOR) 20 MG tablet Take 1 tablet (20 mg total) by mouth daily. 11/13/16 02/11/17 Yes Burnell Blanks, MD  fluticasone (FLONASE) 50 MCG/ACT nasal spray Place 2 sprays into both nostrils daily. Patient not taking: Reported on 01/17/2017 04/17/16   Hoyt Koch, MD  gabapentin (NEURONTIN) 300 MG capsule Start with 1 tab po qhs X 1 week, then increase to 1 tab po bid X 1 week then 1 tab po tid prn Patient not taking: Reported on 01/17/2017 06/24/16   Gerda Diss, DO   Family History: Family Status  Relation Status  . Mother Alive       [phone]  . Father Alive       1935  . Son (Not Specified)       '73  . Son (Not Specified)       '75  . PGF (Not Specified)  . Other (Not Specified)  . Other (Not Specified)    Social History:   reports  that she has been smoking Cigarettes.  She has a 15.00 pack-year smoking history. She has never used smokeless tobacco. She reports that she does not drink alcohol or use drugs.   Social History   Social History Narrative   HSG,  graduated from [employer] in [place]. In college [name] -grad '13 with relgious studies. [place] - Fall '13 for MA-divinity. Marrried '73 - 2 years/ divorced. 2 son- '73, '75 - CP.    Occupation: full-time Ship broker. She lives alone with her younger son living with her part-time but he resides in a managed care facility.     Review of Systems: Has gained 40 pounds over the past year.  Does not get much in the way of regular exercise.  She complains of  esophageal reflux and has been using Tums frequently.  She has some urinary frequency and urgency.  Has generalized aching.  She has had significant low back pain and has required epidural steroid injections.  Complains of headache and also complains of anxiety as well as some panic attacks.  Has history of pyelonephritis as well as dysuria.  Followed by rheumatology for her lupus.  Other than as noted above the remainder of the review of systems is unremarkable.  Physical Exam: BP (!) 145/88   Pulse 71   Temp 98.2 F (36.8 C) (Oral)   Resp 20   SpO2 98%   General appearance: severely obese black female currently in no acute distress Head: Normocephalic, without obvious abnormality, atraumatic Eyes: conjunctivae/corneas clear. PERRL, EOM's intact. Fundi not examined. Neck: no adenopathy, no carotid bruit, no JVD and supple, symmetrical, trachea midline Lungs: clear to auscultation bilaterally Heart: regular rate and rhythm, normal S1 and S2, 1 to 2/6 systolic murmur at apex Abdomen: soft, non-tender; bowel sounds normal; no masses,  no organomegaly and severely obese Pelvic: deferred Extremities: extremities normal, atraumatic, no cyanosis or edema Pulses: 2+ and symmetric Skin: Skin color,  texture, turgor normal. No rashes or lesions Neurologic: Grossly normal Psych: Alert and oriented x 3 Labs: CBC  Recent Labs  01/17/17 0734  WBC 3.7*  RBC 3.73*  HGB 12.9  HCT 37.3  PLT 179  MCV 100.0  MCH 34.6*  MCHC 34.6  RDW 15.1   CMP   Recent Labs  01/17/17 0734  NA 139  K 3.8  CL 108  CO2 24  GLUCOSE 98  BUN 11  CREATININE 0.95  CALCIUM 8.5*  GFRNONAA >60  GFRAA >60   Cardiac Panel (last 3 results) Troponin (Point of Care Test)  Recent Labs  01/17/17 0810  TROPIPOC 0.04    Radiology:  Mild cardiomegaly, no acute disease  CT scan shows aortic atherosclerosis, mild SMA narrowing.  Mild scarring and lungs, no dissection  EKG: Left atrial enlargement, otherwise the EKG appears normal. Independently reviewed by me  Signed:  W. Doristine Church MD [facility]   Cardiology Consultant  01/17/2017, 10:39 AM

## 2017-01-17 NOTE — ED Notes (Signed)
Friend at bedside.

## 2017-01-17 NOTE — ED Triage Notes (Signed)
Per pt, states face, neck, shoulder and chest pain that started 2 days ago-states history of MI-states dizzy and nauseated

## 2017-01-19 MED ORDER — LEVOTHYROXINE SODIUM 50 MCG PO TABS: ORAL_TABLET | ORAL | 0 refills | Status: DC

## 2017-01-19 NOTE — Telephone Encounter (Signed)
Pt called for a refill of this,  Appt made with MD 11/26 [facility] [address]

## 2017-01-19 NOTE — Addendum Note (Signed)
Addended by: Earnstine Regal on: 01/19/2017 01:26 PM   Modules accepted: Orders

## 2017-01-20 ENCOUNTER — Encounter: Payer: Self-pay | Admitting: Cardiology

## 2017-01-20 ENCOUNTER — Ambulatory Visit (INDEPENDENT_AMBULATORY_CARE_PROVIDER_SITE_OTHER): Payer: BLUE CROSS/BLUE SHIELD | Admitting: Cardiology

## 2017-01-20 VITALS — BP 136/86 | HR 60 | Resp 16 | Ht 67.0 in | Wt 251.8 lb

## 2017-01-20 DIAGNOSIS — I200 Unstable angina: Secondary | ICD-10-CM

## 2017-01-20 MED ORDER — LEVOTHYROXINE SODIUM 50 MCG PO TABS: ORAL_TABLET | ORAL | 0 refills | Status: DC

## 2017-01-20 NOTE — Addendum Note (Signed)
Addended by: Earnstine Regal on: 01/20/2017 04:22 PM   Modules accepted: Orders

## 2017-01-20 NOTE — Telephone Encounter (Signed)
Pt called back checking on this refill.. It was sent to the wrong pharmacy. [facility] [address] (see previous note).

## 2017-01-20 NOTE — Progress Notes (Signed)
01/20/2017 [name]   April 08, 1955  [id number]  Primary Physician Hoyt Koch, MD Primary Cardiologist: Dr. Angelena Form    Reason for Visit/CC: Neck, Jaw and left Arm pain  HPI:  [name] is a 61 y.o. 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 She is a former pt of Dr. Melvern Banker. She is now followed by Dr. Angelena Form.  She presents to clinic today with recent complaints of left sided neck, jaw and shoulder/ arm pain. Feels like tightness. Can occur at rest. Feels similar to previous angina, however no associated n/v or diaphoresis like she had in 2003. Symptoms have gradually gotten worse over the last several weeks. No relief with antacids. She has had some mild exertional dyspnea. She is currently pain free. Her last cath was in 2003. We discussed stress test vs definitive [facility] and she prefers to proceed with cath.   She also has concerns regarding carotid artery disease. She recently saw Dr. Edsel Petrin of [facility] as she is planning on getting dental implants. He ordered imaging as part of his w/u and noted that she has carotid artery calcifications. She denies any prior h/o or symptoms of stroke/TIA. We will attempt to obtain these records from his office for further review.   Pt is currently pain free in clinic today. BP is well controlled.     Current Meds  Medication Sig  . acetaminophen (TYLENOL) 500 MG tablet Take 2 tablets (1,000 mg total) by mouth every 6 (six) hours as needed.  Marland Kitchen aspirin 81 MG chewable tablet Chew 81 mg by mouth daily.   Marland Kitchen atenolol (TENORMIN) 25 MG tablet TAKE 1 TABLET(25 MG) BY MOUTH DAILY  . azaTHIOprine (IMURAN) 50 MG tablet Take 50 mg by mouth 2 (two) times daily.  . calcium  carbonate (TUMS - DOSED IN MG ELEMENTAL CALCIUM) 500 MG chewable tablet Chew 1 tablet by mouth 2 (two) times daily with a meal.   . fluticasone (FLONASE) 50 MCG/ACT nasal spray Place 2 sprays into both nostrils daily.  . hydroxychloroquine (PLAQUENIL) 200 MG tablet Take 200 mg by mouth daily.   Marland Kitchen levothyroxine (SYNTHROID, LEVOTHROID) 50 MCG tablet TAKE 1 TABLET(50 MCG) BY MOUTH DAILY  . losartan (COZAAR) 25 MG tablet TAKE 1 TABLET(25 MG) BY MOUTH DAILY  . methocarbamol (ROBAXIN-750) 750 MG tablet Take 1 tablet (750 mg total) by mouth 4 (four) times daily.  . nitroGLYCERIN (NITROSTAT) 0.4 MG SL tablet Place 0.4 mg under the tongue every 5 (five) minutes x 3 doses as needed for chest pain.   . pantoprazole (PROTONIX) 40 MG tablet TAKE 1 TABLET(40 MG) BY MOUTH DAILY  . rosuvastatin (CRESTOR) 20 MG tablet Take 1 tablet (20 mg total) by mouth daily.   No Known Allergies Past Medical History:  Diagnosis Date  . Chronic ischemic heart disease, unspecified    w/ stents in mid circumflex and mid RCA in 03, and balloon angioplasty of Diagonal branch 10-03, last cath Sept 08- 2 vessel disease w/ patent stents  . Esophageal reflux   . Hypothyroidism   . Immune thrombocytopenic purpura ([place])   . Lupus erythematosus   . Mitral valve insufficiency and aortic valve insufficiency   . Myocardial infarction ([place])    x  2  . Peripheral vascular disease, unspecified ([place])   . Unspecified disease of pericardium    Family History  Problem Relation Age of Onset  . Cancer Mother        breast and cervical  . Paget's disease of bone Mother   . Fibromyalgia Mother   . Hypertension Father   . Heart disease Father        PVD  . GER disease Father   . Cancer Paternal Grandfather        colon  . Heart attack Other   . Coronary artery disease Other   . Diabetes Other   . Hypertension Other   . Hyperlipidemia Other    Past Surgical History:  Procedure Laterality Date  . CHOLECYSTECTOMY, LAPAROSCOPIC  2010   . plastic surgical repair      dog bite to leg  . SPLENECTOMY  5-04   Social History   Social History  . Marital status: Single    Spouse name: N/A  . Number of children: N/A  . Years of education: N/A   Occupational History  . Not on file.   Social History Main Topics  . Smoking status: Current Some Day Smoker    Packs/day: 0.50    Years: 30.00    Types: Cigarettes    Last attempt to quit: 12/23/2011  . Smokeless tobacco: Never Used  . Alcohol use No  . Drug use: No  . Sexual activity: No   Other Topics Concern  . Not on file   Social History Narrative   HSG,  graduated from [employer] in [place] state. In college [name] -grad '13 with relgious studies. [employer] - Fall '13 for MA-divinity. Marrried '73 - 2 years/ divorced. 2 son- '73, '75 - CP.    Occupation: full-time Ship broker. She lives alone with her younger son living with her part-time but he resides in a managed care facility.      Review of Systems: General: negative for chills, fever, night sweats or weight changes.  Cardiovascular: negative for chest pain, dyspnea on exertion, edema, orthopnea, palpitations, paroxysmal nocturnal dyspnea or shortness of breath Dermatological: negative for rash Respiratory: negative for cough or wheezing Urologic: negative for hematuria Abdominal: negative for nausea, vomiting, diarrhea, bright red blood per rectum, melena, or hematemesis Neurologic: negative for visual changes, syncope, or dizziness All other systems reviewed and are otherwise negative except as noted above.   Physical Exam:  Blood pressure 136/86, pulse 60, resp. rate 16, height 5\' 7"  (1.702 m), weight 251 lb 12.8 oz (114.2 kg), SpO2 99 %.  General appearance: alert, cooperative, no distress and obese Neck: no carotid bruit and no JVD Lungs: clear to auscultation bilaterally Heart: regular rate and rhythm, S1, S2 normal, no murmur, click, rub or gallop Extremities: extremities normal, atraumatic,  no cyanosis or edema Pulses: 2+ and symmetric Skin: Skin color, texture, turgor normal. No rashes or lesions Neurologic: Grossly normal  EKG not performed -- personally reviewed   ASSESSMENT AND PLAN:   1. Unstable Angina: known h/o CAD s/p PCI + stenting in 2003. She has not had repeat cath since that time. Other risk factors include tobacco use, HTN and HLD. She has had symptoms of left jaw, neck, shoulder and arm pain similar to previous angina. Currently pain free in clinic. We discussed options and pt would prefer to proceed with definitive [facility] to reassess coronaries and check stent patency.   I have reviewed the risks, indications, and alternatives to cardiac catheterization  and possible angioplasty/stenting with the patient. Risks include but are not limited to bleeding, infection, vascular injury, stroke, myocardial infection, arrhythmia, kidney injury, radiation-related injury in the case of prolonged fluoroscopy use, emergency cardiac surgery, and death. The patient understands the risks of serious complication is low (<9%). She agrees to proceed. We have arrange [facility] with Dr. Tamala Julian on 01/23/17.    2. ? Carotid Artery Calcification:  Pt recently saw Dr. Edsel Petrin of [facility] as she is planning on getting dental implants. He ordered imaging as part of his w/u and noted that she has carotid artery calcifications. She denies any prior h/o or symptoms of stroke/TIA. We will attempt to obtain these records from his office for further review. We will base need for further w/u based on recent radioloy report.   3. HTN: well controlled on current regimen.   4. HLD: on statin therapy with Crestor.    F/u post cath.   Garron Eline Ladoris Gene, MHS [facility] 01/20/2017 11:50 AM

## 2017-01-20 NOTE — Telephone Encounter (Signed)
[facility] 

## 2017-01-20 NOTE — Patient Instructions (Signed)
Medication Instructions: Your physician recommends that you continue on your current medications as directed. Please refer to the Current Medication list given to you today.  Labwork: None Ordered  Procedures/Testing: Your physician has requested that you have a cardiac catheterization. Cardiac catheterization is used to diagnose and/or treat various heart conditions. Doctors may recommend this procedure for a number of different reasons. The most common reason is to evaluate chest pain. Chest pain can be a symptom of coronary artery disease (CAD), and cardiac catheterization can show whether plaque is narrowing or blocking your heart's arteries. This procedure is also used to evaluate the valves, as well as measure the blood flow and oxygen levels in different parts of your heart. For further information please visit [email or web address][email or web address]. Please follow instruction sheet, as given.  Follow-Up: Your physician recommends that you schedule a follow-up appointment in: 2 Weeks with Dr. Angelena Form or an APP on Dr. Camillia Herter Team  If you need a refill on your cardiac medications before your next appointment, please call your pharmacy.     [facility] OFF[facility] [address] [place] [address] Dept: [phone] Loc: [place]  01/20/2017  You are scheduled for a Cardiac Catheterization on Friday, November 2 with Dr. Daneen Schick.  1. Please arrive at the [facility] (Main Entrance A) at [facility]: [place], [place] [address] at 10:00 AM (two hours before your procedure to ensure your preparation). Free valet parking service is available.   Special note: Every effort is made to have your procedure done on time. Please understand that emergencies sometimes delay scheduled procedures.  2. Diet: Do not eat or drink anything after midnight prior to your procedure except  sips of water to take medications.  3. Labs: None needed.  4. Please do not take your Losartan the Morning of your procedure. You may take all other morning medications, including Aspirin with enough water to get them down safely.   5. Plan for one night stay--bring personal belongings. 6. Bring a current list of your medications and current insurance cards. 7. You MUST have a responsible person to drive you home. 8. Someone MUST be with you the first 24 hours after you arrive home or your discharge will be delayed. 9. Please wear clothes that are easy to get on and off and wear slip-on shoes.  Thank you for allowing [name] to care for you!   -- [facility]

## 2017-01-21 ENCOUNTER — Telehealth: Payer: Self-pay

## 2017-01-21 NOTE — Telephone Encounter (Signed)
Left detailed message per DPR:  Patient contacted pre-catheterization at [facility] scheduled for:  01/23/2017 @ 1200 Verified arrival time and place:  NT @ 1000 Confirmed AM meds to be taken pre-cath with sip of water: Take ASA Hold losartan Notified patient must have responsible person to drive home post procedure and observe patient for 24 hours Addl concerns: left this nurse name and # for call back

## 2017-01-23 ENCOUNTER — Encounter (HOSPITAL_COMMUNITY)
Admission: AD | Disposition: A | Discharge status: Home / Self Care | Payer: Self-pay | Source: Ambulatory Visit | Attending: Surgery

## 2017-01-23 ENCOUNTER — Inpatient Hospital Stay (HOSPITAL_COMMUNITY)
Admission: AD | Admit: 2017-01-23 | Discharge: 2017-02-11 | DRG: 217 | Disposition: A | Discharge status: Home / Self Care | Payer: BLUE CROSS/BLUE SHIELD | Source: Ambulatory Visit | Attending: Surgery | Admitting: Surgery

## 2017-01-23 DIAGNOSIS — I209 Angina pectoris, unspecified: Secondary | ICD-10-CM | POA: Diagnosis not present

## 2017-01-23 DIAGNOSIS — I5042 Chronic combined systolic (congestive) and diastolic (congestive) heart failure: Secondary | ICD-10-CM | POA: Diagnosis present

## 2017-01-23 DIAGNOSIS — I25119 Atherosclerotic heart disease of native coronary artery with unspecified angina pectoris: Secondary | ICD-10-CM

## 2017-01-23 DIAGNOSIS — Z7982 Long term (current) use of aspirin: Secondary | ICD-10-CM

## 2017-01-23 DIAGNOSIS — J309 Allergic rhinitis, unspecified: Secondary | ICD-10-CM | POA: Diagnosis present

## 2017-01-23 DIAGNOSIS — E785 Hyperlipidemia, unspecified: Secondary | ICD-10-CM | POA: Diagnosis present

## 2017-01-23 DIAGNOSIS — Z951 Presence of aortocoronary bypass graft: Secondary | ICD-10-CM

## 2017-01-23 DIAGNOSIS — I4892 Unspecified atrial flutter: Secondary | ICD-10-CM | POA: Diagnosis not present

## 2017-01-23 DIAGNOSIS — I340 Nonrheumatic mitral (valve) insufficiency: Secondary | ICD-10-CM | POA: Diagnosis not present

## 2017-01-23 DIAGNOSIS — R0602 Shortness of breath: Secondary | ICD-10-CM | POA: Diagnosis not present

## 2017-01-23 DIAGNOSIS — I2582 Chronic total occlusion of coronary artery: Secondary | ICD-10-CM | POA: Diagnosis present

## 2017-01-23 DIAGNOSIS — T82858A Stenosis of vascular prosthetic devices, implants and grafts, initial encounter: Secondary | ICD-10-CM | POA: Diagnosis not present

## 2017-01-23 DIAGNOSIS — I361 Nonrheumatic tricuspid (valve) insufficiency: Secondary | ICD-10-CM | POA: Diagnosis not present

## 2017-01-23 DIAGNOSIS — I509 Heart failure, unspecified: Secondary | ICD-10-CM | POA: Diagnosis not present

## 2017-01-23 DIAGNOSIS — E039 Hypothyroidism, unspecified: Secondary | ICD-10-CM | POA: Diagnosis present

## 2017-01-23 DIAGNOSIS — R51 Headache: Secondary | ICD-10-CM | POA: Diagnosis not present

## 2017-01-23 DIAGNOSIS — Z833 Family history of diabetes mellitus: Secondary | ICD-10-CM

## 2017-01-23 DIAGNOSIS — I272 Pulmonary hypertension, unspecified: Secondary | ICD-10-CM | POA: Diagnosis present

## 2017-01-23 DIAGNOSIS — K219 Gastro-esophageal reflux disease without esophagitis: Secondary | ICD-10-CM | POA: Diagnosis present

## 2017-01-23 DIAGNOSIS — J9811 Atelectasis: Secondary | ICD-10-CM | POA: Diagnosis not present

## 2017-01-23 DIAGNOSIS — Z9889 Other specified postprocedural states: Secondary | ICD-10-CM | POA: Diagnosis not present

## 2017-01-23 DIAGNOSIS — Z6841 Body Mass Index (BMI) 40.0 and over, adult: Secondary | ICD-10-CM

## 2017-01-23 DIAGNOSIS — I059 Rheumatic mitral valve disease, unspecified: Secondary | ICD-10-CM | POA: Diagnosis not present

## 2017-01-23 DIAGNOSIS — I25759 Atherosclerosis of native coronary artery of transplanted heart with unspecified angina pectoris: Secondary | ICD-10-CM | POA: Diagnosis not present

## 2017-01-23 DIAGNOSIS — Z9081 Acquired absence of spleen: Secondary | ICD-10-CM

## 2017-01-23 DIAGNOSIS — I959 Hypotension, unspecified: Secondary | ICD-10-CM | POA: Diagnosis not present

## 2017-01-23 DIAGNOSIS — Z8049 Family history of malignant neoplasm of other genital organs: Secondary | ICD-10-CM

## 2017-01-23 DIAGNOSIS — Y838 Other surgical procedures as the cause of abnormal reaction of the patient, or of later complication, without mention of misadventure at the time of the procedure: Secondary | ICD-10-CM | POA: Diagnosis present

## 2017-01-23 DIAGNOSIS — I82409 Acute embolism and thrombosis of unspecified deep veins of unspecified lower extremity: Secondary | ICD-10-CM | POA: Diagnosis not present

## 2017-01-23 DIAGNOSIS — J90 Pleural effusion, not elsewhere classified: Secondary | ICD-10-CM | POA: Diagnosis not present

## 2017-01-23 DIAGNOSIS — Z79899 Other long term (current) drug therapy: Secondary | ICD-10-CM

## 2017-01-23 DIAGNOSIS — I4891 Unspecified atrial fibrillation: Secondary | ICD-10-CM | POA: Diagnosis not present

## 2017-01-23 DIAGNOSIS — K5900 Constipation, unspecified: Secondary | ICD-10-CM | POA: Diagnosis present

## 2017-01-23 DIAGNOSIS — D693 Immune thrombocytopenic purpura: Secondary | ICD-10-CM | POA: Diagnosis not present

## 2017-01-23 DIAGNOSIS — I200 Unstable angina: Secondary | ICD-10-CM | POA: Diagnosis not present

## 2017-01-23 DIAGNOSIS — R918 Other nonspecific abnormal finding of lung field: Secondary | ICD-10-CM | POA: Diagnosis not present

## 2017-01-23 DIAGNOSIS — E782 Mixed hyperlipidemia: Secondary | ICD-10-CM | POA: Diagnosis not present

## 2017-01-23 DIAGNOSIS — M329 Systemic lupus erythematosus, unspecified: Secondary | ICD-10-CM | POA: Diagnosis present

## 2017-01-23 DIAGNOSIS — I252 Old myocardial infarction: Secondary | ICD-10-CM | POA: Diagnosis not present

## 2017-01-23 DIAGNOSIS — E7800 Pure hypercholesterolemia, unspecified: Secondary | ICD-10-CM | POA: Diagnosis not present

## 2017-01-23 DIAGNOSIS — I2510 Atherosclerotic heart disease of native coronary artery without angina pectoris: Secondary | ICD-10-CM

## 2017-01-23 DIAGNOSIS — D62 Acute posthemorrhagic anemia: Secondary | ICD-10-CM | POA: Diagnosis present

## 2017-01-23 DIAGNOSIS — R079 Chest pain, unspecified: Secondary | ICD-10-CM | POA: Diagnosis not present

## 2017-01-23 DIAGNOSIS — I083 Combined rheumatic disorders of mitral, aortic and tricuspid valves: Secondary | ICD-10-CM | POA: Diagnosis present

## 2017-01-23 DIAGNOSIS — R05 Cough: Secondary | ICD-10-CM

## 2017-01-23 DIAGNOSIS — R011 Cardiac murmur, unspecified: Secondary | ICD-10-CM | POA: Diagnosis present

## 2017-01-23 DIAGNOSIS — Z955 Presence of coronary angioplasty implant and graft: Secondary | ICD-10-CM

## 2017-01-23 DIAGNOSIS — Z9049 Acquired absence of other specified parts of digestive tract: Secondary | ICD-10-CM

## 2017-01-23 DIAGNOSIS — I110 Hypertensive heart disease with heart failure: Secondary | ICD-10-CM | POA: Diagnosis not present

## 2017-01-23 DIAGNOSIS — I255 Ischemic cardiomyopathy: Secondary | ICD-10-CM | POA: Diagnosis present

## 2017-01-23 DIAGNOSIS — Z803 Family history of malignant neoplasm of breast: Secondary | ICD-10-CM

## 2017-01-23 DIAGNOSIS — N058 Unspecified nephritic syndrome with other morphologic changes: Secondary | ICD-10-CM | POA: Diagnosis present

## 2017-01-23 DIAGNOSIS — I480 Paroxysmal atrial fibrillation: Secondary | ICD-10-CM | POA: Diagnosis not present

## 2017-01-23 DIAGNOSIS — I739 Peripheral vascular disease, unspecified: Secondary | ICD-10-CM | POA: Diagnosis present

## 2017-01-23 DIAGNOSIS — M3214 Glomerular disease in systemic lupus erythematosus: Secondary | ICD-10-CM | POA: Diagnosis present

## 2017-01-23 DIAGNOSIS — Z8249 Family history of ischemic heart disease and other diseases of the circulatory system: Secondary | ICD-10-CM

## 2017-01-23 DIAGNOSIS — R040 Epistaxis: Secondary | ICD-10-CM | POA: Diagnosis present

## 2017-01-23 DIAGNOSIS — I25110 Atherosclerotic heart disease of native coronary artery with unstable angina pectoris: Secondary | ICD-10-CM | POA: Diagnosis not present

## 2017-01-23 DIAGNOSIS — Y92230 Patient room in hospital as the place of occurrence of the external cause: Secondary | ICD-10-CM | POA: Diagnosis not present

## 2017-01-23 DIAGNOSIS — R059 Cough, unspecified: Secondary | ICD-10-CM

## 2017-01-23 DIAGNOSIS — I25709 Atherosclerosis of coronary artery bypass graft(s), unspecified, with unspecified angina pectoris: Secondary | ICD-10-CM | POA: Diagnosis present

## 2017-01-23 DIAGNOSIS — I25700 Atherosclerosis of coronary artery bypass graft(s), unspecified, with unstable angina pectoris: Secondary | ICD-10-CM | POA: Diagnosis not present

## 2017-01-23 DIAGNOSIS — Z01810 Encounter for preprocedural cardiovascular examination: Secondary | ICD-10-CM | POA: Diagnosis not present

## 2017-01-23 DIAGNOSIS — E669 Obesity, unspecified: Secondary | ICD-10-CM | POA: Diagnosis present

## 2017-01-23 DIAGNOSIS — R001 Bradycardia, unspecified: Secondary | ICD-10-CM | POA: Diagnosis present

## 2017-01-23 DIAGNOSIS — I341 Nonrheumatic mitral (valve) prolapse: Secondary | ICD-10-CM | POA: Diagnosis not present

## 2017-01-23 DIAGNOSIS — F17210 Nicotine dependence, cigarettes, uncomplicated: Secondary | ICD-10-CM | POA: Diagnosis present

## 2017-01-23 DIAGNOSIS — Z800 Family history of malignant neoplasm of digestive organs: Secondary | ICD-10-CM

## 2017-01-23 DIAGNOSIS — T463X5A Adverse effect of coronary vasodilators, initial encounter: Secondary | ICD-10-CM | POA: Diagnosis not present

## 2017-01-23 DIAGNOSIS — Z79890 Hormone replacement therapy (postmenopausal): Secondary | ICD-10-CM

## 2017-01-23 HISTORY — PX: LEFT HEART CATH AND CORONARY ANGIOGRAPHY: CATH118249

## 2017-01-23 HISTORY — PX: ULTRASOUND GUIDANCE FOR VASCULAR ACCESS: SHX6516

## 2017-01-23 LAB — PROTIME-INR
INR: 1.03
Prothrombin Time: 13.4 seconds (ref 11.4–15.2)

## 2017-01-23 SURGERY — LEFT HEART CATH AND CORONARY ANGIOGRAPHY
Anesthesia: LOCAL

## 2017-01-23 MED ORDER — SODIUM CHLORIDE 0.9% FLUSH
3.0000 mL | INTRAVENOUS | Status: DC | PRN
Start: 2017-01-23 — End: 2017-01-30

## 2017-01-23 MED ORDER — HEPARIN (PORCINE) IN NACL 2-0.9 UNIT/ML-% IJ SOLN
INTRAMUSCULAR | Status: AC | PRN
  Administered 2017-01-23: 1000 mL

## 2017-01-23 MED ORDER — METHOCARBAMOL 500 MG PO TABS
750.0000 mg | ORAL_TABLET | Freq: Four times a day (QID) | ORAL | Status: DC
  Administered 2017-01-23 – 2017-02-06 (×28): 750 mg via ORAL
  Filled 2017-01-23 (×12): qty 2
  Filled 2017-01-23: qty 1
  Filled 2017-01-23 (×14): qty 2
  Filled 2017-01-23: qty 1
  Filled 2017-01-23 (×13): qty 2

## 2017-01-23 MED ORDER — SODIUM CHLORIDE 0.9 % IV SOLN: INTRAVENOUS | Status: AC

## 2017-01-23 MED ORDER — FENTANYL CITRATE (PF) 100 MCG/2ML IJ SOLN
INTRAMUSCULAR | Status: DC | PRN
  Administered 2017-01-23: 50 ug via INTRAVENOUS
  Administered 2017-01-23: 25 ug via INTRAVENOUS

## 2017-01-23 MED ORDER — VERAPAMIL HCL 2.5 MG/ML IV SOLN
INTRAVENOUS | Status: AC
  Filled 2017-01-23: qty 2

## 2017-01-23 MED ORDER — LEVOTHYROXINE SODIUM 50 MCG PO TABS
50.0000 ug | ORAL_TABLET | Freq: Every day | ORAL | Status: DC
  Administered 2017-01-24 – 2017-02-03 (×10): 50 ug via ORAL
  Filled 2017-01-23 (×10): qty 1

## 2017-01-23 MED ORDER — ROSUVASTATIN CALCIUM 10 MG PO TABS
20.0000 mg | ORAL_TABLET | Freq: Every day | ORAL | Status: DC
  Administered 2017-01-23 – 2017-01-26 (×3): 20 mg via ORAL
  Filled 2017-01-23: qty 2
  Filled 2017-01-23: qty 1
  Filled 2017-01-23 (×2): qty 2

## 2017-01-23 MED ORDER — PANTOPRAZOLE SODIUM 40 MG PO TBEC
40.0000 mg | DELAYED_RELEASE_TABLET | Freq: Every day | ORAL | Status: DC
  Administered 2017-01-24 – 2017-01-29 (×6): 40 mg via ORAL
  Filled 2017-01-23 (×7): qty 1

## 2017-01-23 MED ORDER — SODIUM CHLORIDE 0.9 % WEIGHT BASED INFUSION
3.0000 mL/kg/h | INTRAVENOUS | Status: DC
  Administered 2017-01-23: 3 mL/kg/h via INTRAVENOUS

## 2017-01-23 MED ORDER — HEPARIN (PORCINE) IN NACL 100-0.45 UNIT/ML-% IJ SOLN
1000.0000 [IU]/h | INTRAMUSCULAR | Status: DC
  Administered 2017-01-23 – 2017-01-24 (×2): 1150 [IU]/h via INTRAVENOUS
  Administered 2017-01-26 – 2017-01-29 (×3): 1000 [IU]/h via INTRAVENOUS
  Filled 2017-01-23 (×7): qty 250

## 2017-01-23 MED ORDER — HEPARIN SODIUM (PORCINE) 1000 UNIT/ML IJ SOLN
INTRAMUSCULAR | Status: AC
  Filled 2017-01-23: qty 1

## 2017-01-23 MED ORDER — SODIUM CHLORIDE 0.9% FLUSH: 3.0000 mL | INTRAVENOUS | Status: DC | PRN

## 2017-01-23 MED ORDER — ACETAMINOPHEN 325 MG PO TABS
650.0000 mg | ORAL_TABLET | Freq: Four times a day (QID) | ORAL | Status: DC | PRN
  Administered 2017-01-23: 650 mg via ORAL
  Filled 2017-01-23: qty 2

## 2017-01-23 MED ORDER — HEPARIN (PORCINE) IN NACL 2-0.9 UNIT/ML-% IJ SOLN
INTRAMUSCULAR | Status: AC
  Filled 2017-01-23: qty 1000

## 2017-01-23 MED ORDER — NITROGLYCERIN IN D5W 200-5 MCG/ML-% IV SOLN
10.0000 ug/min | INTRAVENOUS | Status: DC
  Administered 2017-01-23: 10 ug/min via INTRAVENOUS
  Filled 2017-01-23: qty 250

## 2017-01-23 MED ORDER — SODIUM CHLORIDE 0.9 % IV SOLN
250.0000 mL | INTRAVENOUS | Status: DC | PRN
Start: 2017-01-23 — End: 2017-01-30

## 2017-01-23 MED ORDER — SODIUM CHLORIDE 0.9 % IV SOLN: 250.0000 mL | INTRAVENOUS | Status: DC | PRN

## 2017-01-23 MED ORDER — MIDAZOLAM HCL 2 MG/2ML IJ SOLN
INTRAMUSCULAR | Status: AC
  Filled 2017-01-23: qty 2

## 2017-01-23 MED ORDER — SODIUM CHLORIDE 0.9% FLUSH
3.0000 mL | Freq: Two times a day (BID) | INTRAVENOUS | Status: DC
  Administered 2017-01-24 – 2017-01-29 (×10): 3 mL via INTRAVENOUS

## 2017-01-23 MED ORDER — ATENOLOL 25 MG PO TABS
50.0000 mg | ORAL_TABLET | Freq: Every day | ORAL | Status: DC
  Administered 2017-01-23: 50 mg via ORAL
  Administered 2017-01-25: 25 mg via ORAL
  Administered 2017-01-26 – 2017-01-29 (×3): 50 mg via ORAL
  Filled 2017-01-23 (×3): qty 2
  Filled 2017-01-23: qty 1
  Filled 2017-01-23 (×5): qty 2

## 2017-01-23 MED ORDER — MIDAZOLAM HCL 2 MG/2ML IJ SOLN
INTRAMUSCULAR | Status: DC | PRN
  Administered 2017-01-23 (×2): 1 mg via INTRAVENOUS

## 2017-01-23 MED ORDER — SODIUM CHLORIDE 0.9 % WEIGHT BASED INFUSION: 1.0000 mL/kg/h | INTRAVENOUS | Status: DC

## 2017-01-23 MED ORDER — NITROGLYCERIN 1 MG/10 ML FOR IR/CATH LAB
INTRA_ARTERIAL | Status: AC
Start: 2017-01-23 — End: 2017-01-23
  Filled 2017-01-23: qty 10

## 2017-01-23 MED ORDER — LIDOCAINE HCL (PF) 1 % IJ SOLN
INTRAMUSCULAR | Status: AC
  Filled 2017-01-23: qty 30

## 2017-01-23 MED ORDER — SODIUM CHLORIDE 0.9% FLUSH: 3.0000 mL | Freq: Two times a day (BID) | INTRAVENOUS | Status: DC

## 2017-01-23 MED ORDER — NITROGLYCERIN 0.4 MG SL SUBL
0.4000 mg | SUBLINGUAL_TABLET | SUBLINGUAL | Status: DC | PRN
Start: 2017-01-23 — End: 2017-01-30
  Administered 2017-01-23: 0.4 mg via SUBLINGUAL

## 2017-01-23 MED ORDER — AZATHIOPRINE 50 MG PO TABS
50.0000 mg | ORAL_TABLET | Freq: Two times a day (BID) | ORAL | Status: DC
  Administered 2017-01-23 – 2017-02-11 (×34): 50 mg via ORAL
  Filled 2017-01-23 (×42): qty 1

## 2017-01-23 MED ORDER — IOPAMIDOL (ISOVUE-370) INJECTION 76%
INTRAVENOUS | Status: AC
  Filled 2017-01-23: qty 100

## 2017-01-23 MED ORDER — ASPIRIN 81 MG PO CHEW: 81.0000 mg | CHEWABLE_TABLET | Freq: Every day | ORAL | Status: DC

## 2017-01-23 MED ORDER — LOSARTAN POTASSIUM 50 MG PO TABS
50.0000 mg | ORAL_TABLET | Freq: Every day | ORAL | Status: DC
  Administered 2017-01-23 – 2017-01-25 (×3): 50 mg via ORAL
  Filled 2017-01-23 (×3): qty 1

## 2017-01-23 MED ORDER — IOPAMIDOL (ISOVUE-370) INJECTION 76%
INTRAVENOUS | Status: DC | PRN
  Administered 2017-01-23: 110 mL via INTRAVENOUS

## 2017-01-23 MED ORDER — FENTANYL CITRATE (PF) 100 MCG/2ML IJ SOLN
INTRAMUSCULAR | Status: AC
  Filled 2017-01-23: qty 2

## 2017-01-23 MED ORDER — ASPIRIN 81 MG PO CHEW: 81.0000 mg | CHEWABLE_TABLET | Freq: Once | ORAL | Status: DC

## 2017-01-23 MED ORDER — HEPARIN SODIUM (PORCINE) 1000 UNIT/ML IJ SOLN
INTRAMUSCULAR | Status: DC | PRN
  Administered 2017-01-23: 5000 [IU] via INTRAVENOUS

## 2017-01-23 MED ORDER — LIDOCAINE HCL (PF) 1 % IJ SOLN
INTRAMUSCULAR | Status: DC | PRN
  Administered 2017-01-23: 2 mL

## 2017-01-23 MED ORDER — ONDANSETRON HCL 4 MG/2ML IJ SOLN
4.0000 mg | Freq: Four times a day (QID) | INTRAMUSCULAR | Status: DC | PRN
  Administered 2017-01-25 – 2017-01-30 (×4): 4 mg via INTRAVENOUS
  Filled 2017-01-23 (×4): qty 2

## 2017-01-23 MED ORDER — IOPAMIDOL (ISOVUE-370) INJECTION 76%
INTRAVENOUS | Status: AC
  Filled 2017-01-23: qty 50

## 2017-01-23 MED ORDER — HYDROXYCHLOROQUINE SULFATE 200 MG PO TABS
200.0000 mg | ORAL_TABLET | Freq: Every day | ORAL | Status: DC
  Administered 2017-01-23 – 2017-01-29 (×7): 200 mg via ORAL
  Filled 2017-01-23 (×10): qty 1

## 2017-01-23 MED ORDER — ASPIRIN 81 MG PO CHEW
81.0000 mg | CHEWABLE_TABLET | Freq: Every day | ORAL | Status: DC
  Administered 2017-01-24 – 2017-01-29 (×6): 81 mg via ORAL
  Filled 2017-01-23 (×7): qty 1

## 2017-01-23 MED ORDER — VERAPAMIL HCL 2.5 MG/ML IV SOLN
INTRAVENOUS | Status: DC | PRN
  Administered 2017-01-23: 10 mL via INTRA_ARTERIAL

## 2017-01-23 MED ORDER — CALCIUM CARBONATE ANTACID 500 MG PO CHEW
1.0000 | CHEWABLE_TABLET | Freq: Two times a day (BID) | ORAL | Status: DC
  Administered 2017-01-23 – 2017-02-11 (×34): 200 mg via ORAL
  Filled 2017-01-23 (×38): qty 1

## 2017-01-23 SURGICAL SUPPLY — 15 items
CATH INFINITI 5 FR JL3.5 (CATHETERS) ×3 IMPLANT
CATH INFINITI JR4 5F (CATHETERS) ×3 IMPLANT
CATH LAUNCHER 5F RADR (CATHETERS) ×2 IMPLANT
CATHETER LAUNCHER 5F RADR (CATHETERS) ×3
COVER PRB 48X5XTLSCP FOLD TPE (BAG) ×2 IMPLANT
COVER PROBE 5X48 (BAG) ×1
DEVICE RAD COMP TR BAND LRG (VASCULAR PRODUCTS) ×3 IMPLANT
GLIDESHEATH SLEND A-KIT 6F 22G (SHEATH) ×3 IMPLANT
GUIDEWIRE INQWIRE 1.5J.035X260 (WIRE) ×2 IMPLANT
INQWIRE 1.5J .035X260CM (WIRE) ×3
KIT HEART LEFT (KITS) ×3 IMPLANT
PACK CARDIAC CATHETERIZATION (CUSTOM PROCEDURE TRAY) ×3 IMPLANT
TRANSDUCER W/STOPCOCK (MISCELLANEOUS) ×3 IMPLANT
TUBING CIL FLEX 10 FLL-RA (TUBING) ×3 IMPLANT
WIRE HI TORQ VERSACORE-J 145CM (WIRE) ×3 IMPLANT

## 2017-01-23 NOTE — Consult Note (Signed)
[address]       [place] [address]             [phone]      Cardiothoracic Surgery Consultation  Reason for Consult: Severe multivessel coronary artery disease Referring Physician: Dr. Daneen Schick    [name] is an 61 y.o. female.  HPI:   The patient is a 60 year old woman with Lupus, hypothyroidism, ITP s/p splenectomy and CAD s/p stenting of the mid LCX and mid RCA in 2003, ischemic cardiomyopathy with EF of 45-50% by echo in 08/2014, moderate MR and tobacco abuse. She presents with a several week history of pain in jaw, neck, across shoulders and back and down both arms with exertion but also at rest. She has been fatigued and has had SOB with exertion. Cath shows severe 3-vessel CAD with chronic total occlusion of the RCA due to in-stent restenosis, 85% proximal LAD stenosis, occlusion of the large D1, 50% mid LAD and 90% distal LAD. The LCX has 70% stenosis in the proximal portion of the stent at the takeoff of the OM which has 85% stenosis at its ostium. There are faint collaterals to the distal RCA. LV gram was poor but EF estimated at 45-50%. No right heart cath done. LVEDP is 23 mm Hg.  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 Just recently started a new job.  Past Medical History:  Diagnosis Date  . Chronic ischemic heart disease, unspecified    w/ stents in mid circumflex and mid RCA in 03, and balloon angioplasty of Diagonal branch 10-03, last cath Sept 08- 2 vessel disease w/ patent stents  . Esophageal reflux   . Hypothyroidism   . Immune thrombocytopenic purpura ([place])   . Lupus erythematosus   . Mitral valve insufficiency and aortic valve insufficiency   . Myocardial infarction ([place])    x 2  . Peripheral vascular disease, unspecified ([place])   . Unspecified disease of pericardium     Past Surgical History:  Procedure Laterality Date  . CHOLECYSTECTOMY, LAPAROSCOPIC  2010  . plastic surgical repair      dog  bite to leg  . SPLENECTOMY  5-04    Family History  Problem Relation Age of Onset  . Cancer Mother        breast and cervical  . Paget's disease of bone Mother   . Fibromyalgia Mother   . Hypertension Father   . Heart disease Father        PVD  . GER disease Father   . Cancer Paternal Grandfather        colon  . Heart attack Other   . Coronary artery disease Other   . Diabetes Other   . Hypertension Other   . Hyperlipidemia Other     Social History:  reports that she has been smoking Cigarettes.  She has a 15.00 pack-year smoking history. She has never used smokeless tobacco. She reports that she does not drink alcohol or use drugs.  Allergies: No Known Allergies  Medications:  I have reviewed the patient's current medications. Prior to Admission:  Prescriptions Prior to Admission  Medication Sig Dispense Refill Last Dose  . acetaminophen (TYLENOL) 500 MG tablet Take 2 tablets (1,000 mg total) by mouth every 6 (six) hours as needed. 30 tablet 0 01/22/2017 at Unknown time  . aspirin 81 MG chewable tablet Chew 81 mg by mouth daily.    01/23/2017 at  0800  . atenolol (TENORMIN) 25 MG tablet TAKE 1 TABLET(25 MG) BY MOUTH DAILY 90 tablet 3 01/22/2017 at Unknown time  . azaTHIOprine (IMURAN) 50 MG tablet Take 50 mg by mouth 2 (two) times daily.   01/23/2017 at 0800  . calcium carbonate (TUMS - DOSED IN MG ELEMENTAL CALCIUM) 500 MG chewable tablet Chew 1 tablet by mouth 2 (two) times daily with a meal.    Past Week at Unknown time  . hydroxychloroquine (PLAQUENIL) 200 MG tablet Take 200 mg by mouth daily.    01/22/2017 at Unknown time  . levothyroxine (SYNTHROID, LEVOTHROID) 50 MCG tablet TAKE 1 TABLET(50 MCG) BY MOUTH DAILY 30 tablet 0 01/23/2017 at 0800  . losartan (COZAAR) 25 MG tablet TAKE 1 TABLET(25 MG) BY MOUTH DAILY 30 tablet 11 01/22/2017 at Unknown time  . methocarbamol (ROBAXIN-750) 750 MG tablet Take 1 tablet (750 mg total) by mouth 4 (four) times daily. 30 tablet 0 01/22/2017 at  Unknown time  . pantoprazole (PROTONIX) 40 MG tablet TAKE 1 TABLET(40 MG) BY MOUTH DAILY 90 tablet 3 01/23/2017 at 0800  . rosuvastatin (CRESTOR) 20 MG tablet Take 1 tablet (20 mg total) by mouth daily. 90 tablet 3 01/22/2017 at Unknown time  . fluticasone (FLONASE) 50 MCG/ACT nasal spray Place 2 sprays into both nostrils daily. 48 g 1 More than a month at Unknown time  . nitroGLYCERIN (NITROSTAT) 0.4 MG SL tablet Place 0.4 mg under the tongue every 5 (five) minutes x 3 doses as needed for chest pain.    More than a month at Unknown time   Scheduled: . aspirin  81 mg Oral Once  . sodium chloride flush  3 mL Intravenous Q12H   Continuous: . sodium chloride    . sodium chloride     EGB:TDVVOH chloride, sodium chloride flush Anti-infectives    None      Results for orders placed or performed during the hospital encounter of 01/23/17 (from the past 48 hour(s))  Protime-INR     Status: None   Collection Time: 01/23/17 10:09 AM  Result Value Ref Range   Prothrombin Time 13.4 11.4 - 15.2 seconds   INR 1.03     No results found.  Review of Systems  Constitutional: Positive for malaise/fatigue. Negative for chills, diaphoresis, fever and weight loss.  HENT: Negative.   Eyes: Negative.   Respiratory: Positive for shortness of breath. Negative for cough.   Cardiovascular: Positive for chest pain. Negative for orthopnea, leg swelling and PND.  Gastrointestinal: Negative.   Genitourinary: Negative.   Musculoskeletal: Positive for back pain and neck pain.  Skin: Negative.   Neurological: Negative.  Negative for weakness.  Endo/Heme/Allergies: Negative.   Psychiatric/Behavioral: Negative.    Blood pressure (!) 169/82, pulse 63, temperature 98.1 F (36.7 C), temperature source Oral, resp. rate (!) 23, height 5\' 7"  (1.702 m), weight 113.4 kg (250 lb), SpO2 100 %. Physical Exam  Constitutional: She is oriented to person, place, and time.  Obese woman in no distress  HENT:  Head:  Normocephalic and atraumatic.  Mouth/Throat: Oropharynx is clear and moist.  Eyes: Pupils are equal, round, and reactive to light. EOM are normal.  Neck: Normal range of motion. Neck supple. No JVD present. No tracheal deviation present. No thyromegaly present.  Cardiovascular: Normal rate, regular rhythm and intact distal pulses.   Murmur heard. 2/6 systolic murmur at apex.  Respiratory: Effort normal and breath sounds normal. No respiratory distress. She has no wheezes. She has no rales.  GI: Soft. Bowel sounds are normal. She exhibits no distension and no mass. There is no tenderness.  Musculoskeletal: Normal range of motion. She exhibits no edema.  Lymphadenopathy:    She has no cervical adenopathy.  Neurological: She is alert and oriented to person, place, and time. She has normal strength. No cranial nerve deficit or sensory deficit.  Skin: Skin is warm and dry.  Psychiatric: She has a normal mood and affect.   Meghan Crome, MD (Primary)    Procedures   LEFT HEART CATH AND CORONARY ANGIOGRAPHY  Conclusion    Severe three-vessel coronary disease with chronic total occlusion of the right coronary due to in-stent restenosis, 85% de novo stenosis in the proximal LAD, total occlusion of the large first diagonal, 70% proximal margin restenosis of mid circumflex stent and 80% stenosis in the first obtuse marginal which is crossed by the stent.  Severe inferior wall hypokinesis.  EF 45-50%.  Elevated LVEDP consistent with chronic combined systolic and diastolic heart failure  RECOMMENDATIONS:   The patient has been having pain at rest.  I will admit her to the hospital, start IV heparin, titrate therapy for blood pressure control, and get an in-hospital consultation for consideration of surgery.  Indications   Coronary artery disease involving native coronary artery of native heart with unstable angina pectoris ([place]) [I25.110 (ICD-10-CM)]  Procedural Details/Technique    Technical Details The right radial area was sterilely prepped and draped. Intravenous sedation with Versed and fentanyl was administered. 1% Xylocaine was infiltrated to achieve local analgesia. Using real-time vascular ultrasound, a double wall stick with an angiocath was utilized to obtain intra-arterial access. The modified Seldinger technique was used to place a 36F " Slender" sheath in the right radial artery. Weight based heparin was administered. Coronary angiography was done using 5 F catheters. Right coronary angiography was performed with a JR4 and an [place] RAD right. Left ventricular hemodymic recordings and angiography was done using the JR 4 catheter and hand injection. Left coronary angiography was performed with a JL 3.5 cm.   Hemostasis was achieved using a pneumatic band.  During this procedure the patient is administered a total of Versed 2 mg and Fentanyl 75 mg to achieve and maintain moderate conscious sedation. The patient's heart rate, blood pressure, and oxygen saturation are monitored continuously during the procedure. The period of conscious sedation is 38 minutes, of which I was present face-to-face 100% of this time.   Estimated blood loss <50 mL.  During this procedure the patient was administered the following to achieve and maintain moderate conscious sedation: Versed 2 mg, Fentanyl 75 mcg, while the patient's heart rate, blood pressure, and oxygen saturation were continuously monitored. The period of conscious sedation was 38 minutes, of which I was present face-to-face 100% of this time.    Coronary Findings   Dominance: Co-dominant  Left Anterior Descending  Prox LAD lesion, 85% stenosed.  Mid LAD to Dist LAD lesion, 50% stenosed.  Dist LAD lesion, 90% stenosed.  First Diagonal Branch  Ost 1st Diag to 1st Diag lesion, 100% stenosed.  Left Circumflex  Ost Cx to Prox Cx lesion, 40% stenosed.  Mid Cx lesion, 70% stenosed.  Mid Cx to Dist Cx lesion, 0% stenosed. Mid  Cx to Dist Cx lesion with no stenosis was previously treated.  First Obtuse Marginal Branch  Ost 1st Mrg lesion, 85% stenosed.  Right Coronary Artery  Dist RCA filled by collaterals from Dist LAD.  Prox RCA lesion, 100% stenosed.  Mid RCA lesion, 100% stenosed.  Wall Motion   Resting               Left Heart   Left Ventricle There is mild left ventricular systolic dysfunction. LV end diastolic pressure is moderately elevated. The left ventricular ejection fraction is 45-50% by visual estimate.    Coronary Diagrams   Diagnostic Diagram       Implants     No implant documentation for this case.  MERGE Images   Show images for CARDIAC CATHETERIZATION   Link to Procedure Log   Procedure Log    Hemo Data    Most Recent Value  AO Systolic Pressure 846 mmHg  AO Diastolic Pressure 83 mmHg  AO Mean 659 mmHg  LV Systolic Pressure 935 mmHg  LV Diastolic Pressure 13 mmHg  LV EDP 23 mmHg  Arterial Occlusion Pressure Extended Systolic Pressure 701 mmHg  Arterial Occlusion Pressure Extended Diastolic Pressure 37 mmHg  Arterial Occlusion Pressure Extended Mean Pressure 101 mmHg  Left Ventricular Apex Extended Systolic Pressure 779 mmHg  Left Ventricular Apex Extended Diastolic Pressure 18 mmHg  Left Ventricular Apex Extended EDP Pressure 24 mmHg    Assessment/Plan:  This 61 year old woman has severe 3-vessel coronary artery disease with mild left ventricular dysfunction and a history of moderate MR by echo, last 07/2016. She now presents with unstable anginal symptoms. She has diffusely diseased and relatively small vessels but I think CABG is the best treatment for her. She will need an echo to reassess LV function and the MR.  I discussed the operative procedure with the patient  including alternatives, benefits and risks; including but not limited to bleeding, blood transfusion, infection, stroke, myocardial infarction, graft failure, heart block requiring a permanent  pacemaker, organ dysfunction, and death.  [name] understands and agrees to proceed.  I will not be able to do surgery until next Friday which is the first opening in our schedule for all of [name]. Cardiology will decide if she needs to remain in the hospital.  I spent 60 minutes performing this consultation and > 50% of this time was spent face to face counseling and coordinating the care of this patient's severe multi-vessel coronary artery disease.   [name] 01/23/2017, 3:37 PM

## 2017-01-23 NOTE — Progress Notes (Signed)
Dr Cyndia Bent is here to discuss CVTS with pt.

## 2017-01-23 NOTE — Interval H&P Note (Signed)
Cath Lab Visit (complete for each Cath Lab visit)  Clinical Evaluation Leading to the Procedure:   ACS: No.  Non-ACS:    Anginal Classification: CCS III  Anti-ischemic medical therapy: Minimal Therapy (1 class of medications)  Non-Invasive Test Results: No non-invasive testing performed  Prior CABG: No previous CABG      History and Physical Interval Note:  01/23/2017 1:40 PM  [name]  has presented today for surgery, with the diagnosis of cad - unstable angina  The various methods of treatment have been discussed with the patient and family. After consideration of risks, benefits and other options for treatment, the patient has consented to  Procedure(s): LEFT HEART CATH AND CORONARY ANGIOGRAPHY (N/A) Ultrasound Guidance For Vascular Access as a surgical intervention .  The patient's history has been reviewed, patient examined, no change in status, stable for surgery.  I have reviewed the patient's chart and labs.  Questions were answered to the patient's satisfaction.     Belva Crome III

## 2017-01-23 NOTE — Progress Notes (Signed)
Pt c/o shoulder and chest pain 4/10. Given sublingual ntg with relief. MD paged and ordered to transfer pt to stepdown on ntg gtt. Report given to oncoming RN and ntg gtt started.

## 2017-01-23 NOTE — Progress Notes (Signed)
MD paged for NTG gtt order clarification. Pt not having chest pain, BP 142/75. This unit can not titrate NTG gtt, will need transfer orders. Currently awaiting for MD to call back. Will continue to monitor pt.

## 2017-01-23 NOTE — Progress Notes (Signed)
Pt leaves cath lab holding area in stable condition. Rt radial is clean, dry and unremarkable.Pt is A/O. Family is updated with room number.

## 2017-01-23 NOTE — H&P (View-Only) (Signed)
01/20/2017 [name]   06/15/1955  [id number]  Primary Physician Hoyt Koch, MD Primary Cardiologist: Dr. Angelena Form    Reason for Visit/CC: Neck, Jaw and left Arm pain  HPI:  [name] is a 61 y.o. 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 She is a former pt of Dr. Melvern Banker. She is now followed by Dr. Angelena Form.  She presents to clinic today with recent complaints of left sided neck, jaw and shoulder/ arm pain. Feels like tightness. Can occur at rest. Feels similar to previous angina, however no associated n/v or diaphoresis like she had in 2003. Symptoms have gradually gotten worse over the last several weeks. No relief with antacids. She has had some mild exertional dyspnea. She is currently pain free. Her last cath was in 2003. We discussed stress test vs definitive [facility] and she prefers to proceed with cath.   She also has concerns regarding carotid artery disease. She recently saw Dr. Edsel Petrin of [facility] as she is planning on getting dental implants. He ordered imaging as part of his w/u and noted that she has carotid artery calcifications. She denies any prior h/o or symptoms of stroke/TIA. We will attempt to obtain these records from his office for further review.   Pt is currently pain free in clinic today. BP is well controlled.     Current Meds  Medication Sig  . acetaminophen (TYLENOL) 500 MG tablet Take 2 tablets (1,000 mg total) by mouth every 6 (six) hours as needed.  Marland Kitchen aspirin 81 MG chewable tablet Chew 81 mg by mouth daily.   Marland Kitchen atenolol (TENORMIN) 25 MG tablet TAKE 1 TABLET(25 MG) BY MOUTH DAILY  . azaTHIOprine (IMURAN) 50 MG tablet Take 50 mg by mouth 2 (two) times daily.  . calcium  carbonate (TUMS - DOSED IN MG ELEMENTAL CALCIUM) 500 MG chewable tablet Chew 1 tablet by mouth 2 (two) times daily with a meal.   . fluticasone (FLONASE) 50 MCG/ACT nasal spray Place 2 sprays into both nostrils daily.  . hydroxychloroquine (PLAQUENIL) 200 MG tablet Take 200 mg by mouth daily.   Marland Kitchen levothyroxine (SYNTHROID, LEVOTHROID) 50 MCG tablet TAKE 1 TABLET(50 MCG) BY MOUTH DAILY  . losartan (COZAAR) 25 MG tablet TAKE 1 TABLET(25 MG) BY MOUTH DAILY  . methocarbamol (ROBAXIN-750) 750 MG tablet Take 1 tablet (750 mg total) by mouth 4 (four) times daily.  . nitroGLYCERIN (NITROSTAT) 0.4 MG SL tablet Place 0.4 mg under the tongue every 5 (five) minutes x 3 doses as needed for chest pain.   . pantoprazole (PROTONIX) 40 MG tablet TAKE 1 TABLET(40 MG) BY MOUTH DAILY  . rosuvastatin (CRESTOR) 20 MG tablet Take 1 tablet (20 mg total) by mouth daily.   No Known Allergies Past Medical History:  Diagnosis Date  . Chronic ischemic heart disease, unspecified    w/ stents in mid circumflex and mid RCA in 03, and balloon angioplasty of Diagonal branch 10-03, last cath Sept 08- 2 vessel disease w/ patent stents  . Esophageal reflux   . Hypothyroidism   . Immune thrombocytopenic purpura ([place])   . Lupus erythematosus   . Mitral valve insufficiency and aortic valve insufficiency   . Myocardial infarction ([place])    x  2  . Peripheral vascular disease, unspecified ([place])   . Unspecified disease of pericardium    Family History  Problem Relation Age of Onset  . Cancer Mother        breast and cervical  . Paget's disease of bone Mother   . Fibromyalgia Mother   . Hypertension Father   . Heart disease Father        PVD  . GER disease Father   . Cancer Paternal Grandfather        colon  . Heart attack Other   . Coronary artery disease Other   . Diabetes Other   . Hypertension Other   . Hyperlipidemia Other    Past Surgical History:  Procedure Laterality Date  . CHOLECYSTECTOMY, LAPAROSCOPIC  2010   . plastic surgical repair      dog bite to leg  . SPLENECTOMY  5-04   Social History   Social History  . Marital status: Single    Spouse name: N/A  . Number of children: N/A  . Years of education: N/A   Occupational History  . Not on file.   Social History Main Topics  . Smoking status: Current Some Day Smoker    Packs/day: 0.50    Years: 30.00    Types: Cigarettes    Last attempt to quit: 12/23/2011  . Smokeless tobacco: Never Used  . Alcohol use No  . Drug use: No  . Sexual activity: No   Other Topics Concern  . Not on file   Social History Narrative   HSG,  graduated from [employer] in [place]. In college [name] -grad '13 with relgious studies. [employer] - Fall '13 for MA-divinity. Marrried '73 - 2 years/ divorced. 2 son- '73, '75 - CP.    Occupation: full-time Ship broker. She lives alone with her younger son living with her part-time but he resides in a managed care facility.      Review of Systems: General: negative for chills, fever, night sweats or weight changes.  Cardiovascular: negative for chest pain, dyspnea on exertion, edema, orthopnea, palpitations, paroxysmal nocturnal dyspnea or shortness of breath Dermatological: negative for rash Respiratory: negative for cough or wheezing Urologic: negative for hematuria Abdominal: negative for nausea, vomiting, diarrhea, bright red blood per rectum, melena, or hematemesis Neurologic: negative for visual changes, syncope, or dizziness All other systems reviewed and are otherwise negative except as noted above.   Physical Exam:  Blood pressure 136/86, pulse 60, resp. rate 16, height 5\' 7"  (1.702 m), weight 251 lb 12.8 oz (114.2 kg), SpO2 99 %.  General appearance: alert, cooperative, no distress and obese Neck: no carotid bruit and no JVD Lungs: clear to auscultation bilaterally Heart: regular rate and rhythm, S1, S2 normal, no murmur, click, rub or gallop Extremities: extremities normal, atraumatic,  no cyanosis or edema Pulses: 2+ and symmetric Skin: Skin color, texture, turgor normal. No rashes or lesions Neurologic: Grossly normal  EKG not performed -- personally reviewed   ASSESSMENT AND PLAN:   1. Unstable Angina: known h/o CAD s/p PCI + stenting in 2003. She has not had repeat cath since that time. Other risk factors include tobacco use, HTN and HLD. She has had symptoms of left jaw, neck, shoulder and arm pain similar to previous angina. Currently pain free in clinic. We discussed options and pt would prefer to proceed with definitive [facility] to reassess coronaries and check stent patency.   I have reviewed the risks, indications, and alternatives to cardiac catheterization  and possible angioplasty/stenting with the patient. Risks include but are not limited to bleeding, infection, vascular injury, stroke, myocardial infection, arrhythmia, kidney injury, radiation-related injury in the case of prolonged fluoroscopy use, emergency cardiac surgery, and death. The patient understands the risks of serious complication is low (<0%). She agrees to proceed. We have arrange [facility] with Dr. Tamala Julian on 01/23/17.    2. ? Carotid Artery Calcification:  Pt recently saw Dr. Edsel Petrin of [facility] as she is planning on getting dental implants. He ordered imaging as part of his w/u and noted that she has carotid artery calcifications. She denies any prior h/o or symptoms of stroke/TIA. We will attempt to obtain these records from his office for further review. We will base need for further w/u based on recent radioloy report.   3. HTN: well controlled on current regimen.   4. HLD: on statin therapy with Crestor.    F/u post cath.   Brittainy Ladoris Gene, MHS [facility] 01/20/2017 11:50 AM

## 2017-01-23 NOTE — Progress Notes (Addendum)
[place] for heparin Indication: chest pain/ACS  Heparin Dosing Weight: 87.4 kg   Assessment: 72 yof with severe multivessel CAD, s/p cath on 11/2. Pharmacy consulted to start heparin 8 hours post-sheath removal in anticipation of CABG, likely 11/9. Not on anticoagulation PTA. CBC wnl. No bleed documented. Sheath removed at 1428.  Goal of Therapy:  Heparin level 0.3-0.7 units/ml Monitor platelets by anticoagulation protocol: Yes   Plan:  Start heparin at 1150 units/h (no bolus) at 2230 6h heparin level Daily heparin level/CBC Monitor s/sx bleeding F/u plans for CABG   Elicia Lamp, PharmD, BCPS Clinical Pharmacist 01/23/2017 4:10 PM

## 2017-01-23 NOTE — Progress Notes (Signed)
Received report from off going nurse who started Nitro gtt @ 10 mics ( 3 cc/hr). Patient denied C/P by 1920, but received Tylenol 650 mg @ 1930 for C/o H/A. Report given to [facility] on 4E and patient was shortly transferred to 3E11 with out acute distress.

## 2017-01-24 ENCOUNTER — Other Ambulatory Visit (HOSPITAL_COMMUNITY): Payer: BLUE CROSS/BLUE SHIELD

## 2017-01-24 ENCOUNTER — Encounter (HOSPITAL_COMMUNITY): Payer: Self-pay

## 2017-01-24 DIAGNOSIS — I200 Unstable angina: Secondary | ICD-10-CM

## 2017-01-24 DIAGNOSIS — E7800 Pure hypercholesterolemia, unspecified: Secondary | ICD-10-CM

## 2017-01-24 DIAGNOSIS — I25759 Atherosclerosis of native coronary artery of transplanted heart with unspecified angina pectoris: Secondary | ICD-10-CM

## 2017-01-24 LAB — SURGICAL PCR SCREEN
MRSA, PCR: NEGATIVE
Staphylococcus aureus: NEGATIVE

## 2017-01-24 LAB — CBC
HCT: 33.3 % — ABNORMAL LOW (ref 36.0–46.0)
Hemoglobin: 11.2 g/dL — ABNORMAL LOW (ref 12.0–15.0)
MCH: 33.7 pg (ref 26.0–34.0)
MCHC: 33.6 g/dL (ref 30.0–36.0)
MCV: 100.3 fL — ABNORMAL HIGH (ref 78.0–100.0)
Platelets: 155 10*3/uL (ref 150–400)
RBC: 3.32 MIL/uL — ABNORMAL LOW (ref 3.87–5.11)
RDW: 14.9 % (ref 11.5–15.5)
WBC: 3.9 10*3/uL — ABNORMAL LOW (ref 4.0–10.5)

## 2017-01-24 LAB — HEPARIN LEVEL (UNFRACTIONATED): Heparin Unfractionated: 0.59 IU/mL (ref 0.30–0.70)

## 2017-01-24 MED ORDER — DIPHENHYDRAMINE HCL 25 MG PO CAPS
25.0000 mg | ORAL_CAPSULE | Freq: Two times a day (BID) | ORAL | Status: DC | PRN
  Administered 2017-01-24: 25 mg via ORAL
  Filled 2017-01-24: qty 1

## 2017-01-24 MED ORDER — ACETAMINOPHEN 325 MG PO TABS
650.0000 mg | ORAL_TABLET | ORAL | Status: DC | PRN
  Administered 2017-01-24 – 2017-01-28 (×8): 650 mg via ORAL
  Filled 2017-01-24 (×8): qty 2

## 2017-01-24 NOTE — Evaluation (Signed)
Physical Therapy Evaluation and Discharge Patient Details Name: OLEVA KOO MRN: [id number] DOB: 01/06/1956 Today's Date: 01/24/2017   History of Present Illness  The patient is a 61 year old woman with Lupus, hypothyroidism, ITP s/p splenectomy and CAD s/p stenting of the mid LCX and mid RCA in 2003, ischemic cardiomyopathy, and tobacco abuse. She presents with a several week history of pain in jaw, neck, across shoulders and back and down both arms with exertion but also at rest.     Clinical Impression  Patient evaluated by Physical Therapy with no further acute PT needs identified. All education has been completed and the patient has no further questions. Patient was educated on anticipated post-op rehab following CABG, including precautions, transfers, and gait. She is currently at an independent level of function. VSS with basic tasks such as short distance ambulation around the room and transfers. She has had 1 fall in the past year (tripped,) and has history of lower back pain for which she receives epidural injections which apparently work very well for managing her pain. Romberg stance eyes open and closed are [facility]. Minor difficulty with tandem stance bil but able to self recover. Recommend PT be re-ordered post-op. See below for any follow-up Physical Therapy or equipment needs. PT is signing off. Thank you for this referral.     Follow Up Recommendations No PT follow up;Other (comment) (Follow-up order to be placed post-op)    Equipment Recommendations  None recommended by PT    Recommendations for Other Services       Precautions / Restrictions Precautions Precautions: Other (comment) (PRE CABG) Precaution Comments: Monitor vitals Restrictions Weight Bearing Restrictions: No      Mobility  Bed Mobility Overal bed mobility: Independent                Transfers Overall transfer level: Independent                  Ambulation/Gait Ambulation/Gait  assistance: Independent   Assistive device: None Gait Pattern/deviations: WFL(Within Functional Limits)   Gait velocity interpretation: >2.62 ft/sec, indicative of independent community ambulator General Gait Details: Gait WNL, ambulating around room HR in up 50s and 60s. No dyspnea.  Stairs            Wheelchair Mobility    Modified Rankin (Stroke Patients Only)       Balance Overall balance assessment: Independent                                           Pertinent Vitals/Pain Pain Assessment: No/denies pain    Home Living Family/patient expects to be discharged to:: Private residence Living Arrangements: Alone Available Help at Discharge: Family;Friend(s);Available 24 hours/day Type of Home: House Home Access: Level entry     Home Layout: Two level;Able to live on main level with bedroom/bathroom;Bed/bath upstairs Home Equipment: None      Prior Function Level of Independence: Independent               Hand Dominance   Dominant Hand: Right    Extremity/Trunk Assessment   Upper Extremity Assessment Upper Extremity Assessment: Defer to OT evaluation    Lower Extremity Assessment Lower Extremity Assessment: Overall WFL for tasks assessed       Communication   Communication: No difficulties  Cognition Arousal/Alertness: Awake/alert Behavior During Therapy: WFL for tasks assessed/performed Overall Cognitive Status: Within  Functional Limits for tasks assessed                                        General Comments General comments (skin integrity, edema, etc.): Time was spent discussing post-op rehab and sternal precautions. Symptom awareness and avoidance of activites that cause exacerbation of symptoms.    Exercises Other Exercises Other Exercises: Practiced sit<>stand transfer techniques without use of UEs in preparation for post-op CABG rehab   Assessment/Plan    PT Assessment Patent does not need  any further PT services  PT Problem List         PT Treatment Interventions      PT Goals (Current goals can be found in the Care Plan section)  Acute Rehab PT Goals Patient Stated Goal: Go home after surgery PT Goal Formulation: All assessment and education complete, DC therapy    Frequency     Barriers to discharge        Co-evaluation               AM-PAC PT "6 Clicks" Daily Activity  Outcome Measure Difficulty turning over in bed (including adjusting bedclothes, sheets and blankets)?: None Difficulty moving from lying on back to sitting on the side of the bed? : None Difficulty sitting down on and standing up from a chair with arms (e.g., wheelchair, bedside commode, etc,.)?: None Help needed moving to and from a bed to chair (including a wheelchair)?: None Help needed walking in hospital room?: None Help needed climbing 3-5 steps with a railing? : None 6 Click Score: 24    End of Session   Activity Tolerance: Patient tolerated treatment well Patient left: in bed Nurse Communication: Mobility status PT Visit Diagnosis: Pain;Other (comment) (Deconditioned with Dyspnea on exertion) Pain - part of body:  (chest)    Time: 4097-3532 PT Time Calculation (min) (ACUTE ONLY): 17 min   Charges:   PT Evaluation $PT Eval Moderate Complexity: 1 Mod     PT G CodesElayne Snare, [place]  Ellouise Newer 01/24/2017, 12:14 PM

## 2017-01-24 NOTE — Progress Notes (Addendum)
Progress Note  Patient Name: [name] Date of Encounter: 01/24/2017  Primary Cardiologist: Dr. Angelena Form  Subjective   Still having some chest discomfort described as pressure  Inpatient Medications    Scheduled Meds: . aspirin  81 mg Oral Daily  . atenolol  50 mg Oral Daily  . azaTHIOprine  50 mg Oral BID  . calcium carbonate  1 tablet Oral BID WC  . hydroxychloroquine  200 mg Oral Daily  . levothyroxine  50 mcg Oral QAC breakfast  . losartan  50 mg Oral Daily  . methocarbamol  750 mg Oral QID  . pantoprazole  40 mg Oral Daily  . rosuvastatin  20 mg Oral Daily  . sodium chloride flush  3 mL Intravenous Q12H   Continuous Infusions: . sodium chloride    . heparin 1,150 Units/hr (01/23/17 2257)  . nitroGLYCERIN 10 mcg/min (01/23/17 1918)   PRN Meds: sodium chloride, acetaminophen, diphenhydrAMINE, nitroGLYCERIN, ondansetron (ZOFRAN) IV, sodium chloride flush   Vital Signs    Vitals:   01/24/17 0100 01/24/17 0420 01/24/17 0435 01/24/17 0735  BP: 119/61 106/79  118/81  Pulse:    (!) 55  Resp: 20 15  (!) 21  Temp: 98.4 F (36.9 C) 98.6 F (37 C)  98.4 F (36.9 C)  TempSrc: Oral Oral  Oral  SpO2: 98%     Weight:   245 lb 11.2 oz (111.4 kg)   Height:        Intake/Output Summary (Last 24 hours) at 01/24/17 1148 Last data filed at 01/24/17 0736  Gross per 24 hour  Intake           319.68 ml  Output             1450 ml  Net         -1130.32 ml   Filed Weights   01/23/17 1000 01/23/17 1600 01/24/17 0435  Weight: 250 lb (113.4 kg) 246 lb 3.2 oz (111.7 kg) 245 lb 11.2 oz (111.4 kg)    Telemetry    NSR - Personally Reviewed  ECG    SB with no ST changes and prolonged QTc - Personally Reviewed  Physical Exam   GEN: No acute distress.   Neck: No JVD Cardiac: RRR, no murmurs, rubs, or gallops.  Respiratory: Clear to auscultation bilaterally. GI: Soft, nontender, non-distended  MS: No edema; No deformity. Neuro:  Nonfocal  Psych: Normal affect     Labs    ChemistryNo results for input(s): NA, K, CL, CO2, GLUCOSE, BUN, CREATININE, CALCIUM, PROT, ALBUMIN, AST, ALT, ALKPHOS, BILITOT, GFRNONAA, GFRAA, ANIONGAP in the last 168 hours.   Hematology Recent Labs Lab 01/24/17 0436  WBC 3.9*  RBC 3.32*  HGB 11.2*  HCT 33.3*  MCV 100.3*  MCH 33.7  MCHC 33.6  RDW 14.9  PLT 155    Cardiac EnzymesNo results for input(s): TROPONINI in the last 168 hours. No results for input(s): TROPIPOC in the last 168 hours.   BNPNo results for input(s): BNP, PROBNP in the last 168 hours.   DDimer No results for input(s): DDIMER in the last 168 hours.   Radiology    No results found.  Cardiac Studies   Cardiac Cath 01/2017 Conclusion    Severe three-vessel coronary disease with chronic total occlusion of the right coronary due to in-stent restenosis, 85% de novo stenosis in the proximal LAD, total occlusion of the large first diagonal, 70% proximal margin restenosis of mid circumflex stent and 80% stenosis in the first  obtuse marginal which is crossed by the stent.  Severe inferior wall hypokinesis.  EF 45-50%.  Elevated LVEDP consistent with chronic combined systolic and diastolic heart failure  RECOMMENDATIONS:   The patient has been having pain at rest.  I will admit her to the hospital, start IV heparin, titrate therapy for blood pressure control, and get an in-hospital consultation for consideration of surgery.     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 She is a former pt of Dr. Melvern Banker. She is now followed by Dr. Angelena Form. She presented to clinic with complaints of left sided neck, jaw and shoulder/ arm pain. Feels like tightness. Can occur at rest. Feels  similar to previous angina, however no associated n/v or diaphoresis like she had in 2003. Symptoms have gradually gotten worse over the last several weeks. No relief with antacids. She has had some mild exertional dyspnea.   Assessment & Plan    1. Unstable Angina: known h/o CAD s/p PCI + stenting in 2003.  -Cath yesterday revealed severe three-vessel coronary disease with chronic total occlusion of the RCA due to in-stent restenosis, 85% proximal LAD stenosis, occluded first diagonal, 70% proximal marginal restenosis of the mid left circumflex and 80% stenosis of the first OM. -Appreciate CTS consult -they cannot perform CABG until next Friday. -For now we will keep in the hospital on IV heparin and IV nitroglycerin as she has been having significant rest angina at home -Continue aspirin, Statin and beta-blocker  2. ? Carotid Artery Calcification:  Pt recently saw Dr. Edsel Petrin of [facility] as she is planning on getting dental implants. He ordered imaging as part of his w/u and noted that she has carotid artery calcifications. She denies any prior h/o or symptoms of stroke/TIA. We will attempt to obtain these records from his office for further review. We will base need for further w/u based on recent radioloy report.   3. HTN:   BP is well controlled -Continue losartan 50 mg daily and Tenormin 50 mg daily  4. HLD: on statin therapy with Crestor.  -LDL was not at goal in August and she was placed on Crestor as she is intolerant to Lipitor -Repeat FLP in a.m.  5.  Epistaxis - ? Related to allergic rhinitis in the setting of anticoagulation - she has quite a bit of blood on a wash cloth - will as ENT to see    For questions or updates, please contact [place] Please consult [email or web address] for contact info under Cardiology/STEMI.      Signed, Fransico Him, MD  01/24/2017, 11:48 AM

## 2017-01-24 NOTE — Progress Notes (Signed)
ANTICOAGULATION CONSULT NOTE - Follow Up Consult  Pharmacy Consult for Heparin Indication: 3VCAD  No Known Allergies  Patient Measurements: Height: 5\' 7"  (170.2 cm) Weight: 245 lb 11.2 oz (111.4 kg) IBW/kg (Calculated) : 61.6 Heparin Dosing Weight:    Vital Signs: Temp: 98.1 F (36.7 C) (11/03 1153) Temp Source: Oral (11/03 1153) BP: 128/81 (11/03 1153) Pulse Rate: 54 (11/03 1153)  Labs:  Recent Labs  01/23/17 1009 01/24/17 0436 01/24/17 1127  HGB  --  11.2*  --   HCT  --  33.3*  --   PLT  --  155  --   LABPROT 13.4  --   --   INR 1.03  --   --   HEPARINUNFRC  --   --  0.59    Estimated Creatinine Clearance: 80 mL/min (by C-G formula based on SCr of 0.95 mg/dL).   Assessment:  Anticoag: Severe 3V CAD. HL 0,59 this AM in goal. Hgb 11.2 down (baseline 13). Plts 155. - 11/3 AM: RN called about pt with nosebleed. Stopped. Pt blew her nose and started again. Pt said they have chronic nosebleeds. MD aware and consulting ENT  Goal of Therapy:  Heparin level 0.3-0.7 units/ml Monitor platelets by anticoagulation protocol: Yes   Plan:  Continue IV heparin at 1150 units/hr Daily heparin level/CBC Monitor s/sx bleeding CABG Friday ENT consult  [name], PharmD, BCPS Clinical Staff Pharmacist Pager [phone]  [name] 01/24/2017,1:37 PM

## 2017-01-25 ENCOUNTER — Inpatient Hospital Stay (HOSPITAL_COMMUNITY): Payer: BLUE CROSS/BLUE SHIELD

## 2017-01-25 ENCOUNTER — Other Ambulatory Visit (HOSPITAL_COMMUNITY): Payer: Self-pay | Admitting: *Deleted

## 2017-01-25 ENCOUNTER — Other Ambulatory Visit (HOSPITAL_COMMUNITY): Payer: BLUE CROSS/BLUE SHIELD

## 2017-01-25 DIAGNOSIS — I361 Nonrheumatic tricuspid (valve) insufficiency: Secondary | ICD-10-CM

## 2017-01-25 LAB — ECHOCARDIOGRAM COMPLETE
Height: 67 in
Weight: 3931.2 oz

## 2017-01-25 LAB — CBC
HCT: 33.9 % — ABNORMAL LOW (ref 36.0–46.0)
Hemoglobin: 11.5 g/dL — ABNORMAL LOW (ref 12.0–15.0)
MCH: 34 pg (ref 26.0–34.0)
MCHC: 33.9 g/dL (ref 30.0–36.0)
MCV: 100.3 fL — ABNORMAL HIGH (ref 78.0–100.0)
Platelets: 158 10*3/uL (ref 150–400)
RBC: 3.38 MIL/uL — ABNORMAL LOW (ref 3.87–5.11)
RDW: 15 % (ref 11.5–15.5)
WBC: 4 10*3/uL (ref 4.0–10.5)

## 2017-01-25 LAB — HEPARIN LEVEL (UNFRACTIONATED)
Heparin Unfractionated: 0.83 IU/mL — ABNORMAL HIGH (ref 0.30–0.70)
Heparin Unfractionated: 0.62 IU/mL (ref 0.30–0.70)

## 2017-01-25 LAB — LIPID PANEL
Cholesterol: 139 mg/dL (ref 0–200)
HDL: 58 mg/dL (ref >40)
LDL Cholesterol: 72 mg/dL (ref 0–99)
Total CHOL/HDL Ratio: 2.4 RATIO
Triglycerides: 45 mg/dL (ref <150)
VLDL: 9 mg/dL (ref 0–40)

## 2017-01-25 MED ORDER — LOSARTAN POTASSIUM 50 MG PO TABS
100.0000 mg | ORAL_TABLET | Freq: Every day | ORAL | Status: DC
  Administered 2017-01-26: 100 mg via ORAL
  Administered 2017-01-27: 50 mg via ORAL
  Administered 2017-01-28 – 2017-01-29 (×2): 100 mg via ORAL
  Filled 2017-01-25 (×5): qty 2

## 2017-01-25 NOTE — Progress Notes (Signed)
ANTICOAGULATION CONSULT NOTE - Follow Up Consult  Pharmacy Consult for Heparin Indication: 3VCAD  No Known Allergies  Patient Measurements: Height: 5\' 7"  (170.2 cm) Weight: 245 lb 11.2 oz (111.4 kg) IBW/kg (Calculated) : 61.6 Heparin Dosing Weight:    Vital Signs: Temp: 98.6 F (37 C) (11/04 0440) Temp Source: Oral (11/04 0440) BP: 132/84 (11/04 1149) Pulse Rate: 47 (11/04 1149)  Labs: Recent Labs    01/23/17 1009 01/24/17 0436 01/24/17 1127 01/25/17 0226 01/25/17 1244  HGB  --  11.2*  --  11.5*  --   HCT  --  33.3*  --  33.9*  --   PLT  --  155  --  158  --   LABPROT 13.4  --   --   --   --   INR 1.03  --   --   --   --   HEPARINUNFRC  --   --  0.59 0.83* 0.62    Estimated Creatinine Clearance: 80 mL/min (by C-G formula based on SCr of 0.95 mg/dL).   Assessment: Anticoag: Severe 3V CAD. HL 0,59 this AM in goal. Hgb 11.5 down (baseline 13) but stable. Plts 158. - 11/3 AM: RN called about pt with nosebleed. Stopped. Pt blew her nose and started again. Pt said they have chronic nosebleeds. MD aware and consulting ENT - 11/4:  HL 0.83,no nosebleeds overnight, Repeat HL 0.62 now in goal range.  Goal of Therapy:  Heparin level 0.3-0.7 units/ml Monitor platelets by anticoagulation protocol: Yes   Plan:  Continue IV heparin at 1000 units/hr Daily heparin level/CBC Monitor s/sx bleeding CABG Friday Could consider discharge home tomorrow and come back as outpatient on Friday for her CABG if she remains angina free with ambulation.  [name], PharmD, BCPS Clinical Staff Pharmacist Pager [phone]  [name] 01/25/2017,1:31 PM

## 2017-01-25 NOTE — Progress Notes (Signed)
  Echocardiogram 2D Echocardiogram has been performed.  Merrie Roof F 01/25/2017, 11:05 AM

## 2017-01-25 NOTE — Progress Notes (Signed)
Progress Note  Patient Name: [name] Date of Encounter: 01/25/2017 no further  Primary Cardiologist: Dr. Angelena Form  Subjective   No further nosebleeds.  She is now off nitroglycerin with no chest pain in bed.  Inpatient Medications    Scheduled Meds: . aspirin  81 mg Oral Daily  . atenolol  50 mg Oral Daily  . azaTHIOprine  50 mg Oral BID  . calcium carbonate  1 tablet Oral BID WC  . hydroxychloroquine  200 mg Oral Daily  . levothyroxine  50 mcg Oral QAC breakfast  . losartan  50 mg Oral Daily  . methocarbamol  750 mg Oral QID  . pantoprazole  40 mg Oral Daily  . rosuvastatin  20 mg Oral Daily  . sodium chloride flush  3 mL Intravenous Q12H   Continuous Infusions: . sodium chloride    . heparin 1,000 Units/hr (01/25/17 0341)  . nitroGLYCERIN Stopped (01/25/17 0340)   PRN Meds: sodium chloride, acetaminophen, diphenhydrAMINE, nitroGLYCERIN, ondansetron (ZOFRAN) IV, sodium chloride flush   Vital Signs    Vitals:   01/24/17 2300 01/25/17 0008 01/25/17 0440 01/25/17 0953  BP:  125/73 138/78 (!) 151/98  Pulse:  63  61  Resp: (!) 21     Temp:   98.6 F (37 C)   TempSrc:   Oral   SpO2:  98% 98%   Weight:      Height:        Intake/Output Summary (Last 24 hours) at 01/25/2017 1128 Last data filed at 01/25/2017 0900 Gross per 24 hour  Intake 600 ml  Output 802 ml  Net -202 ml   Filed Weights   01/23/17 1000 01/23/17 1600 01/24/17 0435  Weight: 250 lb (113.4 kg) 246 lb 3.2 oz (111.7 kg) 245 lb 11.2 oz (111.4 kg)    Telemetry    NSR - Personally Reviewed  ECG    No new EKG to review - Personally Reviewed  Physical Exam   GEN: No acute distress.   Neck: No JVD Cardiac: RRR, no murmurs, rubs, or gallops.  Respiratory: Clear to auscultation bilaterally. GI: Soft, nontender, non-distended  MS: No edema; No deformity. Neuro:  Nonfocal  Psych: Normal affect   Labs    ChemistryNo results for input(s): NA, K, CL, CO2, GLUCOSE, BUN, CREATININE,  CALCIUM, PROT, ALBUMIN, AST, ALT, ALKPHOS, BILITOT, GFRNONAA, GFRAA, ANIONGAP in the last 168 hours.   Hematology Recent Labs  Lab 01/24/17 0436 01/25/17 0226  WBC 3.9* 4.0  RBC 3.32* 3.38*  HGB 11.2* 11.5*  HCT 33.3* 33.9*  MCV 100.3* 100.3*  MCH 33.7 34.0  MCHC 33.6 33.9  RDW 14.9 15.0  PLT 155 158    Cardiac EnzymesNo results for input(s): TROPONINI in the last 168 hours. No results for input(s): TROPIPOC in the last 168 hours.   BNPNo results for input(s): BNP, PROBNP in the last 168 hours.   DDimer No results for input(s): DDIMER in the last 168 hours.   Radiology    No results found.  Cardiac Studies   Cardiac Cath 01/2017 Conclusion    Severe three-vessel coronary disease with chronic total occlusion of the right coronary due to in-stent restenosis, 85% de novo stenosis in the proximal LAD, total occlusion of the large first diagonal, 70% proximal margin restenosis of mid circumflex stent and 80% stenosis in the first obtuse marginal which is crossed by the stent.  Severe inferior wall hypokinesis. EF 45-50%. Elevated LVEDP consistent with chronic combined systolic and diastolic heart failure  RECOMMENDATIONS:   The patient has been having pain at rest. I will admit her to the hospital, start IV heparin, titrate therapy for blood pressure control, and get an in-hospital consultation for consideration of surgery.     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 She is a former pt of Dr. Melvern Banker. She is now followed by Dr. Angelena Form. She presented to clinic with complaints of left sided neck, jaw and shoulder/ arm pain. Feels like tightness. Can occur at rest. Feels similar to previous  angina, however no associated n/v or diaphoresis like she had in 2003. Symptoms have gradually gotten worse over the last several weeks. No relief with antacids. She has had some mild exertional dyspnea.   Assessment & Plan    1. Unstable Angina: known h/o CAD s/p PCI + stenting in 2003.  -Cath revealed severe three-vessel coronary disease with chronic total occlusion of the RCA due to in-stent restenosis, 85% proximal LAD stenosis, occluded first diagonal, 70% proximal marginal restenosis of the mid left circumflex and 80% stenosis of the first OM. -Appreciate CTS consult -they cannot perform CABG until next Friday. -For now we will keep in the hospital on IV as she has been having significant rest angina at home.  Could consider discharge home tomorrow and come back as outpatient on Friday for her CABG if she remains angina free with ambulation. -Continue aspirin, Statin and beta-blocker  2. ? Carotid Artery Calcification: Pt recently saw Dr. Edsel Petrin of [facility] as she is planning on getting dental implants. He ordered imaging as part of his w/u and noted that she has carotid artery calcifications. She denies any prior h/o or symptoms of stroke/TIA.  - check carotid dopplers  3. HTN:  BP elevated at times -Continue Tenormin 50 mg daily - cannot increase  further due to borderline bradycardia - Will increase losartan to 100 mg daily - repeat BMET in am  4. HLD: on statin therapy with Crestor.  -LDL was not at goal in August and she was placed on Crestor as she is intolerant to Lipitor - will order an FLP for the am  5.  Epistaxis - ? Related to allergic rhinitis in the setting of anticoagulation -This has completely stopped    For questions or updates, please contact [place] Please consult [email or web address] for contact info under Cardiology/STEMI.      Signed, Fransico Him, MD  01/25/2017, 11:28 AM

## 2017-01-25 NOTE — Progress Notes (Signed)
ANTICOAGULATION CONSULT NOTE - Follow Up Consult  Pharmacy Consult for heparin Indication: CAD awaiting CABG  Labs: Recent Labs    01/23/17 1009 01/24/17 0436 01/24/17 1127 01/25/17 0226  HGB  --  11.2*  --  11.5*  HCT  --  33.3*  --  33.9*  PLT  --  155  --  158  LABPROT 13.4  --   --   --   INR 1.03  --   --   --   HEPARINUNFRC  --   --  0.59 0.83*    Assessment: 61yo female now above goal on heparin after one level at goal; pt reported epistaxis yesterday (normal for her) but no problems overnight, Hgb stable.  Goal of Therapy:  Heparin level 0.3-0.7 units/ml   Plan:  Will decrease heparin gtt by 1-2 units/kg/hr to 1000 units/hr and check level in 6hr.  Wynona Neat, PharmD, BCPS  01/25/2017,3:38 AM

## 2017-01-26 ENCOUNTER — Encounter (HOSPITAL_COMMUNITY): Payer: Self-pay | Admitting: Interventional Cardiology

## 2017-01-26 ENCOUNTER — Other Ambulatory Visit: Payer: Self-pay

## 2017-01-26 DIAGNOSIS — I25119 Atherosclerotic heart disease of native coronary artery with unspecified angina pectoris: Secondary | ICD-10-CM

## 2017-01-26 DIAGNOSIS — I059 Rheumatic mitral valve disease, unspecified: Secondary | ICD-10-CM

## 2017-01-26 DIAGNOSIS — I209 Angina pectoris, unspecified: Secondary | ICD-10-CM

## 2017-01-26 LAB — BASIC METABOLIC PANEL
Anion gap: 7 (ref 5–15)
BUN: 10 mg/dL (ref 6–20)
CO2: 22 mmol/L (ref 22–32)
Calcium: 8.3 mg/dL — ABNORMAL LOW (ref 8.9–10.3)
Chloride: 110 mmol/L (ref 101–111)
Creatinine, Ser: 0.98 mg/dL (ref 0.44–1.00)
GFR calc Af Amer: >60 mL/min (ref >60)
GFR calc non Af Amer: >60 mL/min (ref >60)
Glucose, Bld: 94 mg/dL (ref 65–99)
Potassium: 3.8 mmol/L (ref 3.5–5.1)
Sodium: 139 mmol/L (ref 135–145)

## 2017-01-26 LAB — LIPID PANEL
Cholesterol: 110 mg/dL (ref 0–200)
HDL: 46 mg/dL (ref >40)
LDL Cholesterol: 52 mg/dL (ref 0–99)
Total CHOL/HDL Ratio: 2.4 RATIO
Triglycerides: 58 mg/dL (ref <150)
VLDL: 12 mg/dL (ref 0–40)

## 2017-01-26 LAB — HEPARIN LEVEL (UNFRACTIONATED): Heparin Unfractionated: 0.47 IU/mL (ref 0.30–0.70)

## 2017-01-26 LAB — CBC
HCT: 34.5 % — ABNORMAL LOW (ref 36.0–46.0)
Hemoglobin: 11.6 g/dL — ABNORMAL LOW (ref 12.0–15.0)
MCH: 33.8 pg (ref 26.0–34.0)
MCHC: 33.6 g/dL (ref 30.0–36.0)
MCV: 100.6 fL — ABNORMAL HIGH (ref 78.0–100.0)
Platelets: 161 10*3/uL (ref 150–400)
RBC: 3.43 MIL/uL — ABNORMAL LOW (ref 3.87–5.11)
RDW: 15.2 % (ref 11.5–15.5)
WBC: 4.1 10*3/uL (ref 4.0–10.5)

## 2017-01-26 MED ORDER — ISOSORBIDE MONONITRATE ER 30 MG PO TB24
30.0000 mg | ORAL_TABLET | Freq: Every day | ORAL | Status: DC
  Administered 2017-01-26 – 2017-01-28 (×3): 30 mg via ORAL
  Filled 2017-01-26 (×5): qty 1

## 2017-01-26 MED ORDER — ROSUVASTATIN CALCIUM 10 MG PO TABS
40.0000 mg | ORAL_TABLET | Freq: Every day | ORAL | Status: DC
  Administered 2017-01-27 – 2017-02-10 (×14): 40 mg via ORAL
  Filled 2017-01-26 (×2): qty 4
  Filled 2017-01-26: qty 2
  Filled 2017-01-26: qty 4
  Filled 2017-01-26: qty 2
  Filled 2017-01-26 (×4): qty 4
  Filled 2017-01-26: qty 2
  Filled 2017-01-26: qty 4
  Filled 2017-01-26: qty 2
  Filled 2017-01-26: qty 4
  Filled 2017-01-26 (×2): qty 2

## 2017-01-26 MED ORDER — SODIUM CHLORIDE 0.9 % IV SOLN
INTRAVENOUS | Status: DC
  Administered 2017-01-26: 23:00:00 via INTRAVENOUS

## 2017-01-26 NOTE — Progress Notes (Signed)
Patient ID: [name], female   DOB: 1955/11/18, 61 y.o.   MRN: [id number] Cardiothoracic Surgery  2D echo reviewed and shows severe MR and severe TR. She needs to have a TEE to evaluate this further to decide what the etiology is to make decisions about repair. Cardiology should schedule this as soon as possible this week.

## 2017-01-26 NOTE — Progress Notes (Signed)
ANTICOAGULATION CONSULT NOTE - Follow Up Consult  Pharmacy Consult for Heparin Indication: 3VCAD  No Known Allergies  Patient Measurements: Height: 5\' 7"  (170.2 cm) Weight: 245 lb 11.2 oz (111.4 kg) IBW/kg (Calculated) : 61.6 Heparin Dosing Weight:    Vital Signs: Temp: 98.6 F (37 C) (11/05 0744) Temp Source: Oral (11/05 0744) BP: 142/58 (11/05 0744) Pulse Rate: 63 (11/05 0744)  Labs: Recent Labs    01/24/17 0436  01/25/17 0226 01/25/17 1244 01/26/17 0301  HGB 11.2*  --  11.5*  --  11.6*  HCT 33.3*  --  33.9*  --  34.5*  PLT 155  --  158  --  161  HEPARINUNFRC  --    < > 0.83* 0.62 0.47  CREATININE  --   --   --   --  0.98   < > = values in this interval not displayed.    Estimated Creatinine Clearance: 77.6 mL/min (by C-G formula based on SCr of 0.98 mg/dL).   Assessment: 61 yo female with severe 3V CAD on heparin with plans for CABG on Friday. Pharmacy dosing heparin and remains at goal. CBC stable  Goal of Therapy:  Heparin level 0.3-0.7 units/ml Monitor platelets by anticoagulation protocol: Yes   Plan:  Continue IV heparin at 1000 units/hr Daily heparin level/CBC CABG Friday  Hildred Laser, Pharm D 01/26/2017 10:14 AM

## 2017-01-26 NOTE — Consult Note (Signed)
[facility] Primary Care Navigator  01/26/2017  [name] 1955-12-17 [id number]   Met with patientat the bedside to identify possible discharge needs. Patient reports having "chest pain" that had led to this admission and eventually surgery.  Patient endorses Dr.Elizabeth Crawford with [employer] [facility] care provider.   Patient shared using [facility] on [address] to obtain medications without any problem.  Patientverbalized managing her own medications at home straight out of the containers.   Patientreports that she drives prior to admission. Brother [name]) or friend ([name]) will be able to provide transportation to herdoctors'appointments after discharge.   Patient states that she lives alone but she has plenty of friends that she can depend on to assist with her care needs after discharge. 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  Disposition for discharge is pending surgical intervention but patient very much anticipates to be discharged home as stated.  Patient voiced understanding to call primary care provider's office whenshe returns back home, for a post discharge follow-up appointment within a week or sooner if needed.Patient letter (with PCP's contact number) was provided as a reminder.  Explained to patient about [facility] CM services available for healthmanagement at home but patient verbalized that she is "managing self well" and denies any needs or concerns at this time. Patient expressed understanding to seekreferral to [facility] care management if deemed necessary andappropriate for services in the future.  [facility] care management information provided for future needs that she may have.   Patient would only opt and verbally agree for EMMI calls to follow-up withrecovery at home.   Referral made for [facility] General calls after  discharge.     For additional questions please contact:  [name], BSN, RN-BC [facility] PRIMARY CARE Navigator Cell: [phone]

## 2017-01-26 NOTE — Progress Notes (Signed)
Progress Note  Patient Name: [name] Date of Encounter: 01/26/2017  Primary Cardiologist: Dr. Angelena Form  Subjective   Still having intermittent chest pain.   Inpatient Medications    Scheduled Meds: . aspirin  81 mg Oral Daily  . atenolol  50 mg Oral Daily  . azaTHIOprine  50 mg Oral BID  . calcium carbonate  1 tablet Oral BID WC  . hydroxychloroquine  200 mg Oral Daily  . levothyroxine  50 mcg Oral QAC breakfast  . losartan  100 mg Oral Daily  . methocarbamol  750 mg Oral QID  . pantoprazole  40 mg Oral Daily  . rosuvastatin  20 mg Oral Daily  . sodium chloride flush  3 mL Intravenous Q12H   Continuous Infusions: . sodium chloride    . heparin 1,000 Units/hr (01/25/17 0341)   PRN Meds: sodium chloride, acetaminophen, diphenhydrAMINE, nitroGLYCERIN, ondansetron (ZOFRAN) IV, sodium chloride flush   Vital Signs    Vitals:   01/26/17 0038 01/26/17 0424 01/26/17 0744 01/26/17 1146  BP: 135/75 138/81 (!) 142/58 (!) 123/55  Pulse: 62  63 (!) 51  Resp:   16 18  Temp:  98.5 F (36.9 C) 98.6 F (37 C) 98.5 F (36.9 C)  TempSrc:  Oral Oral Oral  SpO2: 98% 96% 94% 92%  Weight:      Height:        Intake/Output Summary (Last 24 hours) at 01/26/2017 1354 Last data filed at 01/26/2017 1342 Gross per 24 hour  Intake 840 ml  Output -  Net 840 ml   Filed Weights   01/23/17 1000 01/23/17 1600 01/24/17 0435  Weight: 250 lb (113.4 kg) 246 lb 3.2 oz (111.7 kg) 245 lb 11.2 oz (111.4 kg)    Telemetry    SR - Personally Reviewed  ECG    N/A  Physical Exam   GEN: No acute distress.   Neck: No JVD Cardiac: RRR, Systolic  murmurs, rubs, or gallops.  Respiratory: Clear to auscultation bilaterally. GI: Soft, nontender, non-distended  MS: No edema; No deformity. Neuro:  Nonfocal  Psych: Normal affect   Labs    Chemistry Recent Labs  Lab 01/26/17 0301  NA 139  K 3.8  CL 110  CO2 22  GLUCOSE 94  BUN 10  CREATININE 0.98  CALCIUM 8.3*  GFRNONAA >60   GFRAA >60  ANIONGAP 7     Hematology Recent Labs  Lab 01/24/17 0436 01/25/17 0226 01/26/17 0301  WBC 3.9* 4.0 4.1  RBC 3.32* 3.38* 3.43*  HGB 11.2* 11.5* 11.6*  HCT 33.3* 33.9* 34.5*  MCV 100.3* 100.3* 100.6*  MCH 33.7 34.0 33.8  MCHC 33.6 33.9 33.6  RDW 14.9 15.0 15.2  PLT 155 158 161    Cardiac EnzymesNo results for input(s): TROPONINI in the last 168 hours. No results for input(s): TROPIPOC in the last 168 hours.   BNPNo results for input(s): BNP, PROBNP in the last 168 hours.   DDimer No results for input(s): DDIMER in the last 168 hours.   Radiology    No results found.  Cardiac Studies  Echo 01/25/17 Study Conclusions  - Left ventricle: The cavity size was normal. Systolic function was   normal. The estimated ejection fraction was in the range of 55%   to 60%. Wall motion was normal; there were no regional wall   motion abnormalities. Features are consistent with a pseudonormal   left ventricular filling pattern, with concomitant abnormal   relaxation and increased filling pressure (grade  2 diastolic   dysfunction). Doppler parameters are consistent with elevated   ventricular end-diastolic filling pressure. - Aortic valve: There was mild regurgitation. - Mitral valve: There was severe regurgitation. - Left atrium: The atrium was moderately dilated. - Tricuspid valve: There was severe regurgitation. - Pulmonary arteries: Systolic pressure was moderately increased.   PA peak pressure: 52 mm Hg (S).  Impressions:  - Mitral regurgitation is now severe, there is moderatel pulmonary   hypertension.   Cath 01/23/17 LEFT HEART CATH AND CORONARY ANGIOGRAPHY  Conclusion    Severe three-vessel coronary disease with chronic total occlusion of the right coronary due to in-stent restenosis, 85% de novo stenosis in the proximal LAD, total occlusion of the large first diagonal, 70% proximal margin restenosis of mid circumflex stent and 80% stenosis in the first  obtuse marginal which is crossed by the stent.  Severe inferior wall hypokinesis.  EF 45-50%.  Elevated LVEDP consistent with chronic combined systolic and diastolic heart failure  RECOMMENDATIONS:   The patient has been having pain at rest.  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 She is a former pt of Dr. Melvern Banker. She is now followed by Dr. Angelena Form. She presented to clinic with complaints of left sided neck, jaw and shoulder/ arm pain. Feels like tightness. Can occur at rest. Feels similar to previous angina, however no associated n/v or diaphoresis like she had in 2003. Symptoms have gradually gotten worse over the last several weeks. No relief with antacids. She has had some mild exertional dyspnea.   Assessment & Plan    1. Unstable angina - Cath showed severe three-vessel coronary disease with chronic total occlusion of the RCA due to in-stent restenosis, 85% proximal LAD stenosis, occluded first diagonal, 70% proximal marginal restenosis of the mid left circumflex and 80% stenosis of the first OM. - For CABG on Friday. Intermittent resting chest pain. Continue ASA, heparin, BB, statin and ARB.   2. Severe MR - Now worsen. TEE tomorrow at 3pm.  The risks and benefits of transesophageal echocardiogram have been explained including risks of esophageal damage, perforation (1:10,000 risk), bleeding, pharyngeal hematoma as well as other potential complications associated with conscious sedation including aspiration, arrhythmia, respiratory failure and  death. Alternatives to treatment were discussed, questions were answered. Patient is willing to proceed.   Bhagat,Bhavinkumar, PA-C 01/26/2017 2:19 PM   3. ? Coronary artery calcification - Pt recently saw Dr. Edsel Petrin of [facility] as she is planning on getting dental implants. He ordered imaging as part of his w/u and noted that she has carotid artery calcifications. She denies any prior h/o or symptoms of stroke/TIA. Pending carotid dopplers  4. HLD - 01/26/2017: Cholesterol 110; HDL 46; LDL Cholesterol 52; Triglycerides 58; VLDL 12  - Increased Crestor to 40mg  qd  5. HTN - BP stable on current medication.     For questions or updates, please contact [place] Please consult [email or web address] for contact info under Cardiology/STEMI.      Signed, Leanor Kail, PA  01/26/2017, 1:54 PM  Patient seen and examined. Agree with assessment and plan. Currently on iv heaprin; apparently not on iv NTG due to H/A .  Will initiate low dose isosorbide today. Bradycardic in the 50s on atenolol,  For TEE tomorrow to further assess MR. CABG tentatively set for Friday.   Troy Sine, MD, [facility] 01/26/2017 4:54 PM

## 2017-01-26 NOTE — Plan of Care (Signed)
Patient remains chest pain free, still having minimal discomfort in her neck.  Patient having increased anxiety concerning upcoming surgery on Friday, particularly intubation.  Discussed intubation and gave information to patient.

## 2017-01-27 ENCOUNTER — Inpatient Hospital Stay (HOSPITAL_COMMUNITY): Payer: BLUE CROSS/BLUE SHIELD

## 2017-01-27 ENCOUNTER — Encounter (HOSPITAL_COMMUNITY)
Admission: AD | Disposition: A | Discharge status: Home / Self Care | Payer: Self-pay | Source: Ambulatory Visit | Attending: Surgery

## 2017-01-27 ENCOUNTER — Ambulatory Visit (HOSPITAL_COMMUNITY): Payer: BLUE CROSS/BLUE SHIELD

## 2017-01-27 ENCOUNTER — Encounter (HOSPITAL_COMMUNITY): Payer: Self-pay | Admitting: *Deleted

## 2017-01-27 DIAGNOSIS — I340 Nonrheumatic mitral (valve) insufficiency: Secondary | ICD-10-CM

## 2017-01-27 DIAGNOSIS — Z01810 Encounter for preprocedural cardiovascular examination: Secondary | ICD-10-CM

## 2017-01-27 HISTORY — PX: TEE WITHOUT CARDIOVERSION: SHX5443

## 2017-01-27 LAB — CBC
HCT: 32.9 % — ABNORMAL LOW (ref 36.0–46.0)
Hemoglobin: 11.2 g/dL — ABNORMAL LOW (ref 12.0–15.0)
MCH: 34.1 pg — ABNORMAL HIGH (ref 26.0–34.0)
MCHC: 34 g/dL (ref 30.0–36.0)
MCV: 100.3 fL — ABNORMAL HIGH (ref 78.0–100.0)
Platelets: 137 10*3/uL — ABNORMAL LOW (ref 150–400)
RBC: 3.28 MIL/uL — ABNORMAL LOW (ref 3.87–5.11)
RDW: 15.2 % (ref 11.5–15.5)
WBC: 3.9 10*3/uL — ABNORMAL LOW (ref 4.0–10.5)

## 2017-01-27 LAB — ECHO TEE
LVOT VTI: 18.2 cm
LVOT peak grad rest: 4 mmHg
LVOT peak vel: 102 cm/s
Reg peak vel: 254 cm/s
TR max vel: 254 cm/s
VTI: 253 cm

## 2017-01-27 LAB — GLUCOSE, CAPILLARY: Glucose-Capillary: 89 mg/dL (ref 65–99)

## 2017-01-27 LAB — HEPARIN LEVEL (UNFRACTIONATED): Heparin Unfractionated: 0.62 IU/mL (ref 0.30–0.70)

## 2017-01-27 SURGERY — ECHOCARDIOGRAM, TRANSESOPHAGEAL
Anesthesia: Moderate Sedation

## 2017-01-27 MED ORDER — TRAMADOL HCL 50 MG PO TABS
50.0000 mg | ORAL_TABLET | Freq: Four times a day (QID) | ORAL | Status: DC | PRN
  Administered 2017-01-28: 50 mg via ORAL
  Filled 2017-01-27 (×3): qty 1

## 2017-01-27 MED ORDER — MIDAZOLAM HCL 5 MG/ML IJ SOLN
INTRAMUSCULAR | Status: AC
  Filled 2017-01-27: qty 2

## 2017-01-27 MED ORDER — FENTANYL CITRATE (PF) 100 MCG/2ML IJ SOLN
INTRAMUSCULAR | Status: AC
  Filled 2017-01-27: qty 2

## 2017-01-27 MED ORDER — BUTAMBEN-TETRACAINE-BENZOCAINE 2-2-14 % EX AERO
INHALATION_SPRAY | CUTANEOUS | Status: DC | PRN
  Administered 2017-01-27: 2 via TOPICAL

## 2017-01-27 MED ORDER — FENTANYL CITRATE (PF) 100 MCG/2ML IJ SOLN
INTRAMUSCULAR | Status: DC | PRN
  Administered 2017-01-27: 25 ug via INTRAVENOUS

## 2017-01-27 MED ORDER — TRAMADOL HCL 50 MG PO TABS
50.0000 mg | ORAL_TABLET | ORAL | Status: AC
  Administered 2017-01-27: 50 mg via ORAL
  Filled 2017-01-27: qty 1

## 2017-01-27 MED ORDER — MIDAZOLAM HCL 10 MG/2ML IJ SOLN
INTRAMUSCULAR | Status: DC | PRN
Start: 2017-01-27 — End: 2017-01-27
  Administered 2017-01-27: 2 mg via INTRAVENOUS

## 2017-01-27 NOTE — Progress Notes (Signed)
Pre-op Cardiac Surgery  Carotid Findings:  Bilateral:  1-39% ICA stenosis.  Vertebral artery flow is antegrade.     Upper Extremity Right Left  Brachial Pressures 139 Triphasic 150 Triphasic  Radial Waveforms Triphasic Triphasic  Ulnar Waveforms Triphasic Triphasic  Palmar Arch (Allen's Test) Normal Normal   Findings:  Doppler waveforms remained normal bilaterally with both radial and ulnar compressions.    Lower  Extremity Right Left  Dorsalis Pedis 150 Triphasic 151 Triphasic  Posterior Tibial 161 Triphasic 157 Triphasic  Ankle/Brachial Indices 1.07 1.05    Findings:  ABIs and Doppler waveforms indicate normal arterail flow bilaterally at rest.  Toma Copier, RVS 01/27/2017 4:49 pm

## 2017-01-27 NOTE — Progress Notes (Signed)
Progress Note  Patient Name: [name] Date of Encounter: 01/27/2017  Primary Cardiologist: Dr. Angelena Form  Subjective   No chest pain or dyspnea. However has headache since yesterday.   Inpatient Medications    Scheduled Meds: . aspirin  81 mg Oral Daily  . atenolol  50 mg Oral Daily  . azaTHIOprine  50 mg Oral BID  . calcium carbonate  1 tablet Oral BID WC  . hydroxychloroquine  200 mg Oral Daily  . isosorbide mononitrate  30 mg Oral Daily  . levothyroxine  50 mcg Oral QAC breakfast  . losartan  100 mg Oral Daily  . methocarbamol  750 mg Oral QID  . pantoprazole  40 mg Oral Daily  . rosuvastatin  40 mg Oral q1800  . sodium chloride flush  3 mL Intravenous Q12H   Continuous Infusions: . sodium chloride    . sodium chloride 20 mL/hr at 01/26/17 2300  . heparin 1,000 Units/hr (01/26/17 2256)   PRN Meds: sodium chloride, acetaminophen, diphenhydrAMINE, nitroGLYCERIN, ondansetron (ZOFRAN) IV, sodium chloride flush   Vital Signs    Vitals:   01/26/17 2304 01/27/17 0100 01/27/17 0300 01/27/17 0757  BP: 93/68   (!) 113/59  Pulse: 61   76  Resp: (!) 22 18 19 16   Temp: 98.1 F (36.7 C)   98.4 F (36.9 C)  TempSrc: Oral   Oral  SpO2: 99%     Weight:      Height:        Intake/Output Summary (Last 24 hours) at 01/27/2017 1027 Last data filed at 01/27/2017 0759 Gross per 24 hour  Intake 240 ml  Output 600 ml  Net -360 ml   Filed Weights   01/23/17 1000 01/23/17 1600 01/24/17 0435  Weight: 250 lb (113.4 kg) 246 lb 3.2 oz (111.7 kg) 245 lb 11.2 oz (111.4 kg)    Telemetry    SR with rate in 50-70s - Personally Reviewed  ECG    N/A  Physical Exam   GEN: No acute distress.   Neck: No JVD Cardiac: RRR, systolic murmurs, rubs, or gallops.  Respiratory: Clear to auscultation bilaterally. GI: Soft, nontender, non-distended  MS: No edema; No deformity. Neuro:  Nonfocal  Psych: Normal affect   Labs    Chemistry Recent Labs  Lab 01/26/17 0301  NA  139  K 3.8  CL 110  CO2 22  GLUCOSE 94  BUN 10  CREATININE 0.98  CALCIUM 8.3*  GFRNONAA >60  GFRAA >60  ANIONGAP 7     Hematology Recent Labs  Lab 01/25/17 0226 01/26/17 0301 01/27/17 0243  WBC 4.0 4.1 3.9*  RBC 3.38* 3.43* 3.28*  HGB 11.5* 11.6* 11.2*  HCT 33.9* 34.5* 32.9*  MCV 100.3* 100.6* 100.3*  MCH 34.0 33.8 34.1*  MCHC 33.9 33.6 34.0  RDW 15.0 15.2 15.2  PLT 158 161 137*    Radiology    No results found.  Cardiac Studies   Echo 01/25/17 Study Conclusions  - Left ventricle: The cavity size was normal. Systolic function was normal. The estimated ejection fraction was in the range of 55% to 60%. Wall motion was normal; there were no regional wall motion abnormalities. Features are consistent with a pseudonormal left ventricular filling pattern, with concomitant abnormal relaxation and increased filling pressure (grade 2 diastolic dysfunction). Doppler parameters are consistent with elevated ventricular end-diastolic filling pressure. - Aortic valve: There was mild regurgitation. - Mitral valve: There was severe regurgitation. - Left atrium: The atrium was moderately dilated. -  Tricuspid valve: There was severe regurgitation. - Pulmonary arteries: Systolic pressure was moderately increased. PA peak pressure: 52 mm Hg (S).  Impressions:  - Mitral regurgitation is now severe, there is moderatel pulmonary hypertension.   Cath 01/23/17 LEFT HEART CATH AND CORONARY ANGIOGRAPHY  Conclusion    Severe three-vessel coronary disease with chronic total occlusion of the right coronary due to in-stent restenosis, 85% de novo stenosis in the proximal LAD, total occlusion of the large first diagonal, 70% proximal margin restenosis of mid circumflex stent and 80% stenosis in the first obtuse marginal which is crossed by the stent.  Severe inferior wall hypokinesis. EF 45-50%. Elevated LVEDP consistent with chronic combined systolic and  diastolic heart failure  RECOMMENDATIONS:   The patient has been having pain at rest. 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 She is a former pt of Dr. Melvern Banker. She is now followed by Dr. Angelena Form. She presented to clinic with complaints of left sided neck, jaw and shoulder/ arm pain. Feels like tightness. Can occur at rest. Feels similar to previous angina, however no associated n/v or diaphoresis like she had in 2003. Symptoms have gradually gotten worse over the last several weeks. No relief with antacids. She has had some mild exertional dyspnea.  Assessment & Plan    1. Unstable angina/CAD - Cath showed severe three-vessel coronary disease with chronic total occlusion of the RCA due to in-stent restenosis, 85% proximal LAD stenosis, occluded first diagonal, 70% proximal marginal restenosis of the mid left circumflex and 80% stenosis of the first OM. - For CABG on Friday. IV nitro discontinued due to headache. Started on Imdur however persistent headache since yesterday. Will give pain meds to see if it improves or not. No recurrent chest pain.  Continue ASA, heparin, BB, statin and ARB.   2. Severe MR - Now worsen. TEE today at 3pm.   3. ? Coronary artery calcification - Pt recently saw Dr. Edsel Petrin of [facility] as she is planning on getting dental implants. He ordered imaging as part of his w/u and noted that she has carotid artery calcifications. She denies any prior h/o or symptoms of stroke/TIA. Pending carotid dopplers  today.  4. HLD - 01/26/2017: Cholesterol 110; HDL 46; LDL Cholesterol 52; Triglycerides 58; VLDL 12  - Increased Crestor to 40mg  qd  5. HTN - BP stable on current medication.     For questions or updates, please contact [place] Please consult [email or web address] for contact info under Cardiology/STEMI.      Jarrett Soho, PA  01/27/2017, 10:27 AM     Patient seen and examined. Agree with assessment and plan. Feels well. Bradycardic in the upper 40's to upper 50s on atenolol without symptoms.  For TEE today and CABG with probable valve repair if needed on Friday.  Troy Sine, MD, [facility] 01/27/2017 12:13 PM

## 2017-01-27 NOTE — Op Note (Signed)
INDICATIONS: MR  PROCEDURE:   Informed consent was obtained prior to the procedure. The risks, benefits and alternatives for the procedure were discussed and the patient comprehended these risks.  Risks include, but are not limited to, cough, sore throat, vomiting, nausea, somnolence, esophageal and stomach trauma or perforation, bleeding, low blood pressure, aspiration, pneumonia, infection, trauma to the teeth and death.    After a procedural time-out, the oropharynx was anesthetized with 20% benzocaine spray.   During this procedure the patient was administered a total of Versed 2 mg and Fentanyl 25 mcg to achieve and maintain moderate conscious sedation.  The patient's heart rate, blood pressure, and oxygen saturationweare monitored continuously during the procedure. The period of conscious sedation was 16 minutes, of which I was present face-to-face 100% of this time.  The transesophageal probe was inserted in the esophagus and stomach without difficulty and multiple views were obtained.  The patient was kept under observation until the patient left the procedure room.  The patient left the procedure room in stable condition.   Agitated microbubble saline contrast was not administered.  COMPLICATIONS:    There were no immediate complications.  FINDINGS:  Normal LVEF 55% and normal wall motion. Dilated left atrium Moderate central mitral regurgitation due to malcoaptation. Mild moderate tricuspid regurgitation. Mild aortic regurgitation.   RECOMMENDATIONS:    Most of the data suggest that this is secondary (functional MR).  Full report with calculation of MR ERO and regurgitant fraction to follow.  Time Spent Directly with the Patient:  30 minutes   Lorane Cousar 01/27/2017, 2:08 PM

## 2017-01-27 NOTE — Interval H&P Note (Signed)
History and Physical Interval Note:  01/27/2017 1:09 PM  [name]  has presented today for surgery, with the diagnosis of SEVERE MR  The various methods of treatment have been discussed with the patient and family. After consideration of risks, benefits and other options for treatment, the patient has consented to  Procedure(s): TRANSESOPHAGEAL ECHOCARDIOGRAM (TEE) (N/A) as a surgical intervention .  The patient's history has been reviewed, patient examined, no change in status, stable for surgery.  I have reviewed the patient's chart and labs.  Questions were answered to the patient's satisfaction.     [name]

## 2017-01-27 NOTE — H&P (View-Only) (Signed)
Progress Note  Patient Name: [name] Date of Encounter: 01/27/2017  Primary Cardiologist: Dr. Angelena Form  Subjective   No chest pain or dyspnea. However has headache since yesterday.   Inpatient Medications    Scheduled Meds: . aspirin  81 mg Oral Daily  . atenolol  50 mg Oral Daily  . azaTHIOprine  50 mg Oral BID  . calcium carbonate  1 tablet Oral BID WC  . hydroxychloroquine  200 mg Oral Daily  . isosorbide mononitrate  30 mg Oral Daily  . levothyroxine  50 mcg Oral QAC breakfast  . losartan  100 mg Oral Daily  . methocarbamol  750 mg Oral QID  . pantoprazole  40 mg Oral Daily  . rosuvastatin  40 mg Oral q1800  . sodium chloride flush  3 mL Intravenous Q12H   Continuous Infusions: . sodium chloride    . sodium chloride 20 mL/hr at 01/26/17 2300  . heparin 1,000 Units/hr (01/26/17 2256)   PRN Meds: sodium chloride, acetaminophen, diphenhydrAMINE, nitroGLYCERIN, ondansetron (ZOFRAN) IV, sodium chloride flush   Vital Signs    Vitals:   01/26/17 2304 01/27/17 0100 01/27/17 0300 01/27/17 0757  BP: 93/68   (!) 113/59  Pulse: 61   76  Resp: (!) 22 18 19 16   Temp: 98.1 F (36.7 C)   98.4 F (36.9 C)  TempSrc: Oral   Oral  SpO2: 99%     Weight:      Height:        Intake/Output Summary (Last 24 hours) at 01/27/2017 1027 Last data filed at 01/27/2017 0759 Gross per 24 hour  Intake 240 ml  Output 600 ml  Net -360 ml   Filed Weights   01/23/17 1000 01/23/17 1600 01/24/17 0435  Weight: 250 lb (113.4 kg) 246 lb 3.2 oz (111.7 kg) 245 lb 11.2 oz (111.4 kg)    Telemetry    SR with rate in 50-70s - Personally Reviewed  ECG    N/A  Physical Exam   GEN: No acute distress.   Neck: No JVD Cardiac: RRR, systolic murmurs, rubs, or gallops.  Respiratory: Clear to auscultation bilaterally. GI: Soft, nontender, non-distended  MS: No edema; No deformity. Neuro:  Nonfocal  Psych: Normal affect   Labs    Chemistry Recent Labs  Lab 01/26/17 0301  NA  139  K 3.8  CL 110  CO2 22  GLUCOSE 94  BUN 10  CREATININE 0.98  CALCIUM 8.3*  GFRNONAA >60  GFRAA >60  ANIONGAP 7     Hematology Recent Labs  Lab 01/25/17 0226 01/26/17 0301 01/27/17 0243  WBC 4.0 4.1 3.9*  RBC 3.38* 3.43* 3.28*  HGB 11.5* 11.6* 11.2*  HCT 33.9* 34.5* 32.9*  MCV 100.3* 100.6* 100.3*  MCH 34.0 33.8 34.1*  MCHC 33.9 33.6 34.0  RDW 15.0 15.2 15.2  PLT 158 161 137*    Radiology    No results found.  Cardiac Studies   Echo 01/25/17 Study Conclusions  - Left ventricle: The cavity size was normal. Systolic function was normal. The estimated ejection fraction was in the range of 55% to 60%. Wall motion was normal; there were no regional wall motion abnormalities. Features are consistent with a pseudonormal left ventricular filling pattern, with concomitant abnormal relaxation and increased filling pressure (grade 2 diastolic dysfunction). Doppler parameters are consistent with elevated ventricular end-diastolic filling pressure. - Aortic valve: There was mild regurgitation. - Mitral valve: There was severe regurgitation. - Left atrium: The atrium was moderately dilated. -  Tricuspid valve: There was severe regurgitation. - Pulmonary arteries: Systolic pressure was moderately increased. PA peak pressure: 52 mm Hg (S).  Impressions:  - Mitral regurgitation is now severe, there is moderatel pulmonary hypertension.   Cath 01/23/17 LEFT HEART CATH AND CORONARY ANGIOGRAPHY  Conclusion    Severe three-vessel coronary disease with chronic total occlusion of the right coronary due to in-stent restenosis, 85% de novo stenosis in the proximal LAD, total occlusion of the large first diagonal, 70% proximal margin restenosis of mid circumflex stent and 80% stenosis in the first obtuse marginal which is crossed by the stent.  Severe inferior wall hypokinesis. EF 45-50%. Elevated LVEDP consistent with chronic combined systolic and  diastolic heart failure  RECOMMENDATIONS:   The patient has been having pain at rest. 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 She is a former pt of Dr. Melvern Banker. She is now followed by Dr. Angelena Form. She presented to clinic with complaints of left sided neck, jaw and shoulder/ arm pain. Feels like tightness. Can occur at rest. Feels similar to previous angina, however no associated n/v or diaphoresis like she had in 2003. Symptoms have gradually gotten worse over the last several weeks. No relief with antacids. She has had some mild exertional dyspnea.  Assessment & Plan    1. Unstable angina/CAD - Cath showed severe three-vessel coronary disease with chronic total occlusion of the RCA due to in-stent restenosis, 85% proximal LAD stenosis, occluded first diagonal, 70% proximal marginal restenosis of the mid left circumflex and 80% stenosis of the first OM. - For CABG on Friday. IV nitro discontinued due to headache. Started on Imdur however persistent headache since yesterday. Will give pain meds to see if it improves or not. No recurrent chest pain.  Continue ASA, heparin, BB, statin and ARB.   2. Severe MR - Now worsen. TEE today at 3pm.   3. ? Coronary artery calcification - Pt recently saw Dr. Edsel Petrin of [facility] as she is planning on getting dental implants. He ordered imaging as part of his w/u and noted that she has carotid artery calcifications. She denies any prior h/o or symptoms of stroke/TIA. Pending carotid dopplers  today.  4. HLD - 01/26/2017: Cholesterol 110; HDL 46; LDL Cholesterol 52; Triglycerides 58; VLDL 12  - Increased Crestor to 40mg  qd  5. HTN - BP stable on current medication.     For questions or updates, please contact [place] Please consult [email or web address] for contact info under Cardiology/STEMI.      Jarrett Soho, PA  01/27/2017, 10:27 AM     Patient seen and examined. Agree with assessment and plan. Feels well. Bradycardic in the upper 40's to upper 50s on atenolol without symptoms.  For TEE today and CABG with probable valve repair if needed on Friday.  Troy Sine, MD, [facility] 01/27/2017 12:13 PM

## 2017-01-27 NOTE — Progress Notes (Signed)
Echocardiogram Echocardiogram Transesophageal has been performed.  [name] 01/27/2017, 2:45 PM

## 2017-01-27 NOTE — Progress Notes (Signed)
ANTICOAGULATION CONSULT NOTE - Follow Up Consult  Pharmacy Consult for Heparin Indication: 3VCAD  No Known Allergies  Patient Measurements: Height: 5\' 7"  (170.2 cm) Weight: 245 lb 11.2 oz (111.4 kg) IBW/kg (Calculated) : 61.6 Heparin Dosing Weight:    Vital Signs: Temp: 98.4 F (36.9 C) (11/06 0757) Temp Source: Oral (11/06 0757) BP: 113/59 (11/06 0757) Pulse Rate: 76 (11/06 0757)  Labs: Recent Labs    01/25/17 0226 01/25/17 1244 01/26/17 0301 01/27/17 0243  HGB 11.5*  --  11.6* 11.2*  HCT 33.9*  --  34.5* 32.9*  PLT 158  --  161 137*  HEPARINUNFRC 0.83* 0.62 0.47 0.62  CREATININE  --   --  0.98  --     Estimated Creatinine Clearance: 77.6 mL/min (by C-G formula based on SCr of 0.98 mg/dL).   Assessment: 61 yo female with severe 3V CAD on heparin with plans for CABG on Friday. Pharmacy dosing heparin and remains at goal. CBC stable -Heparin level is 0.62 and at goal  Goal of Therapy:  Heparin level 0.3-0.7 units/ml Monitor platelets by anticoagulation protocol: Yes   Plan:  Continue IV heparin at 1000 units/hr Daily heparin level/CBC CABG Friday  Hildred Laser, Pharm D 01/27/2017 10:32 AM

## 2017-01-27 NOTE — Care Management Note (Signed)
Case Management Note Marvetta Gibbons RN, BSN Unit 4E-Case Manager [phone]  Patient Details  Name: [name] MRN: [id number] Date of Birth: 1955-12-06  Subjective/Objective:   Pt admitted with CAD s/p cath with MVD- plan for CABG on 01/30/17                 Action/Plan: PTA pt lived at home alone- has support after discharge- brother, Friends and church members that can provide assistance-  CM to follow post op for d/c needs-   Expected Discharge Date:                  Expected Discharge Plan:     In-House Referral:     Discharge planning Services  CM Consult  Post Acute Care Choice:    Choice offered to:     DME Arranged:    DME Agency:     HH Arranged:    [place] Agency:     Status of Service:  In process, will continue to follow  If discussed at Long Length of Stay Meetings, dates discussed:    Discharge Disposition:   Additional Comments:  Dawayne Patricia, RN 01/27/2017, 3:21 PM

## 2017-01-28 ENCOUNTER — Encounter (HOSPITAL_COMMUNITY): Payer: BLUE CROSS/BLUE SHIELD

## 2017-01-28 LAB — CBC
HCT: 34 % — ABNORMAL LOW (ref 36.0–46.0)
Hemoglobin: 11.5 g/dL — ABNORMAL LOW (ref 12.0–15.0)
MCH: 34.1 pg — ABNORMAL HIGH (ref 26.0–34.0)
MCHC: 33.8 g/dL (ref 30.0–36.0)
MCV: 100.9 fL — ABNORMAL HIGH (ref 78.0–100.0)
Platelets: 130 10*3/uL — ABNORMAL LOW (ref 150–400)
RBC: 3.37 MIL/uL — ABNORMAL LOW (ref 3.87–5.11)
RDW: 15.2 % (ref 11.5–15.5)
WBC: 4.6 10*3/uL (ref 4.0–10.5)

## 2017-01-28 LAB — GLUCOSE, CAPILLARY: Glucose-Capillary: 104 mg/dL — ABNORMAL HIGH (ref 65–99)

## 2017-01-28 LAB — HEPARIN LEVEL (UNFRACTIONATED): Heparin Unfractionated: 0.44 IU/mL (ref 0.30–0.70)

## 2017-01-28 NOTE — Progress Notes (Signed)
Progress Note  Patient Name: [name] Date of Encounter: 01/28/2017  Primary Cardiologist: Angelena Form  Subjective   No complaints, no further headache.   Inpatient Medications    Scheduled Meds: . aspirin  81 mg Oral Daily  . atenolol  50 mg Oral Daily  . azaTHIOprine  50 mg Oral BID  . calcium carbonate  1 tablet Oral BID WC  . hydroxychloroquine  200 mg Oral Daily  . isosorbide mononitrate  30 mg Oral Daily  . levothyroxine  50 mcg Oral QAC breakfast  . losartan  100 mg Oral Daily  . methocarbamol  750 mg Oral QID  . pantoprazole  40 mg Oral Daily  . rosuvastatin  40 mg Oral q1800  . sodium chloride flush  3 mL Intravenous Q12H   Continuous Infusions: . sodium chloride    . heparin 1,000 Units/hr (01/28/17 0093)   PRN Meds: sodium chloride, acetaminophen, diphenhydrAMINE, nitroGLYCERIN, ondansetron (ZOFRAN) IV, sodium chloride flush, traMADol   Vital Signs    Vitals:   01/27/17 2128 01/28/17 0015 01/28/17 0455 01/28/17 0811  BP:  129/85 (!) 143/80 128/76  Pulse:  63 (!) 199 66  Resp:  15 19 17   Temp: 98.5 F (36.9 C) 98.6 F (37 C) 98 F (36.7 C) 98.4 F (36.9 C)  TempSrc: Oral Oral Oral Oral  SpO2:  94% 93% 91%  Weight:      Height:        Intake/Output Summary (Last 24 hours) at 01/28/2017 1120 Last data filed at 01/28/2017 8182 Gross per 24 hour  Intake 240 ml  Output 540 ml  Net -300 ml   Filed Weights   01/23/17 1600 01/24/17 0435 01/27/17 1359  Weight: 246 lb 3.2 oz (111.7 kg) 245 lb 11.2 oz (111.4 kg) 245 lb (111.1 kg)    Telemetry    SR rates 50-70 - Personally Reviewed   Physical Exam   General: Well developed, well nourished, female appearing in no acute distress. Head: Normocephalic, atraumatic.  Neck: Supple without bruits, JVD. Lungs:  Resp regular and unlabored, CTA. Heart: RRR, S1, S2, no S3, S4, systolic murmur; no rub. Abdomen: Soft, non-tender, non-distended with normoactive bowel sounds.  Extremities: No clubbing,  cyanosis, edema. Distal pedal pulses are 2+ bilaterally. Neuro: Alert and oriented X 3. Moves all extremities spontaneously. Psych: Normal affect.  Labs    Chemistry Recent Labs  Lab 01/26/17 0301  NA 139  K 3.8  CL 110  CO2 22  GLUCOSE 94  BUN 10  CREATININE 0.98  CALCIUM 8.3*  GFRNONAA >60  GFRAA >60  ANIONGAP 7     Hematology Recent Labs  Lab 01/26/17 0301 01/27/17 0243 01/28/17 0211  WBC 4.1 3.9* 4.6  RBC 3.43* 3.28* 3.37*  HGB 11.6* 11.2* 11.5*  HCT 34.5* 32.9* 34.0*  MCV 100.6* 100.3* 100.9*  MCH 33.8 34.1* 34.1*  MCHC 33.6 34.0 33.8  RDW 15.2 15.2 15.2  PLT 161 137* 130*    Cardiac EnzymesNo results for input(s): TROPONINI in the last 168 hours. No results for input(s): TROPIPOC in the last 168 hours.   BNPNo results for input(s): BNP, PROBNP in the last 168 hours.   DDimer No results for input(s): DDIMER in the last 168 hours.    Radiology    No results found.  Cardiac Studies   Echo 01/25/17 Study Conclusions  - Left ventricle: The cavity size was normal. Systolic function was normal. The estimated ejection fraction was in the range of 55% to  60%. Wall motion was normal; there were no regional wall motion abnormalities. Features are consistent with a pseudonormal left ventricular filling pattern, with concomitant abnormal relaxation and increased filling pressure (grade 2 diastolic dysfunction). Doppler parameters are consistent with elevated ventricular end-diastolic filling pressure. - Aortic valve: There was mild regurgitation. - Mitral valve: There was severe regurgitation. - Left atrium: The atrium was moderately dilated. - Tricuspid valve: There was severe regurgitation. - Pulmonary arteries: Systolic pressure was moderately increased. PA peak pressure: 52 mm Hg (S).  Impressions:  - Mitral regurgitation is now severe, there is moderatel pulmonary hypertension.   Cath 01/23/17 LEFT HEART CATH AND  CORONARY ANGIOGRAPHY  Conclusion    Severe three-vessel coronary disease with chronic total occlusion of the right coronary due to in-stent restenosis, 85% de novo stenosis in the proximal LAD, total occlusion of the large first diagonal, 70% proximal margin restenosis of mid circumflex stent and 80% stenosis in the first obtuse marginal which is crossed by the stent.  Severe inferior wall hypokinesis. EF 45-50%. Elevated LVEDP consistent with chronic combined systolic and diastolic heart failure  RECOMMENDATIONS:   The patient has been having pain at rest. I will admit her to the hospital, start IV heparin, titrate therapy for blood pressure control, and get an in-hospital consultation for consideration of surgery   TEE: 01/27/17  Study Conclusions  - Left ventricle: The cavity size was normal. Wall thickness was   normal. Systolic function was normal. The estimated ejection   fraction was in the range of 55% to 60%. Wall motion was normal;   there were no regional wall motion abnormalities. - Aortic valve: No evidence of vegetation. There was mild   regurgitation directed centrally in the LVOT. - Mitral valve: There was malcoaptation of the valve leaflets. No   evidence of vegetation. Valve area by continuity equation (using   LVOT flow): 3.37 cm^2. - Left atrium: The atrium was dilated. No evidence of thrombus in   the atrial cavity or appendage. No evidence of thrombus in the   appendage. - Right atrium: The atrium was dilated. - Atrial septum: No defect or patent foramen ovale was identified. - Tricuspid valve: No evidence of vegetation. - Pulmonic valve: No evidence of vegetation.  Impressions:  - Severity of MR appears to be moderate. There is no evidence of   flow reversal in either right or left pulmonary veins. ERO   calculation and regurgitant volume fraction by both PISA and   continuity equation are in the moderate range. Color Doppler   overestimates the  severity of MR due to the central nature of the   jet and the HTN-driven high velocity of the jet (SBP during the   test was 170 mm Hg).  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 She is a former pt of Dr. Melvern Banker. She is now followed by Dr. Angelena Form. She presented to clinic with complaints of left sided neck, jaw and shoulder/ arm pain. Feels like tightness. Can occur at rest. Feels similar to previous angina, however no associated n/v or diaphoresis like she had in 2003. Symptoms have gradually gotten worse over the last several weeks. No relief with antacids. She has had some  mild exertional dyspnea.  Assessment & Plan    1. Unstable angina/CAD: Cath showedsevere three-vessel coronary disease with chronic total occlusion of the RCA due to in-stent restenosis, 85% proximal LAD stenosis, occluded first diagonal, 70% proximal marginal restenosis of the mid left circumflex and 80% stenosis of the first OM. -- For CABG on Friday. IV nitro discontinued due to headache. Now on Imdur without recurrence of headaches.  --Continue ASA, heparin, BB, statin and ARB.   2. Severe MR: Reported as severe on echo, but TEE yesterday reports as moderate.   3. Carotid artery calcification:  -- bilateral 1-39% ICA stenosis per dopplers yesterday  4. HLD: -01/26/2017: Cholesterol 110; HDL 46; LDL Cholesterol 52; Triglycerides 58; VLDL 12 - Increased Crestor to 40mg  qd  5. HTN:  - BP stable on current medication.  Signed, Reino Bellis, NP  01/28/2017, 11:20 AM  Pager # [phone]    Patient seen and examined. Agree with assessment and plan.  No recurrent chest pain.  TEE reveals normal systolic function with mild aortic  insufficiency.  There was malcoaptation of the mitral valve leaflets with moderate mitral regurgitation, and without evidence for flow reversal in either right or left pulmonary veins.  Both ERO calculation and regurgitant volume fraction were also in the moderate range.   Troy Sine, MD, [facility] 01/28/2017 5:02 PM   For questions or updates, please contact [facility] Please consult [email or web address] for contact info under Cardiology/STEMI.

## 2017-01-28 NOTE — Progress Notes (Signed)
ANTICOAGULATION CONSULT NOTE - Follow Up Consult  Pharmacy Consult for Heparin Indication: 3VCAD  No Known Allergies  Patient Measurements: Height: 5\' 7"  (170.2 cm) Weight: 245 lb (111.1 kg) IBW/kg (Calculated) : 61.6 Heparin Dosing Weight:    Vital Signs: Temp: 98.4 F (36.9 C) (11/07 1232) Temp Source: Oral (11/07 1232) BP: 119/80 (11/07 1232) Pulse Rate: 64 (11/07 1232)  Labs: Recent Labs    01/26/17 0301 01/27/17 0243 01/28/17 0211  HGB 11.6* 11.2* 11.5*  HCT 34.5* 32.9* 34.0*  PLT 161 137* 130*  HEPARINUNFRC 0.47 0.62 0.44  CREATININE 0.98  --   --     Estimated Creatinine Clearance: 77.5 mL/min (by C-G formula based on SCr of 0.98 mg/dL).   Assessment: 61 yo female with severe 3V CAD on heparin with plans for CABG on Friday. Pharmacy dosing heparin and remains at goal. CBC stable -Heparin level is 0.44 and at goal -plt with slow down trend   Goal of Therapy:  Heparin level 0.3-0.7 units/ml Monitor platelets by anticoagulation protocol: Yes   Plan:  Continue IV heparin at 1000 units/hr Daily heparin level/CBC CABG Friday  Hildred Laser, Pharm D 01/28/2017 1:35 PM

## 2017-01-29 ENCOUNTER — Inpatient Hospital Stay (HOSPITAL_COMMUNITY): Payer: BLUE CROSS/BLUE SHIELD

## 2017-01-29 LAB — PULMONARY FUNCTION TEST
DL/VA % pred: 105 %
DL/VA: 5.45 ml/min/mmHg/L
DLCO cor % pred: 72 %
DLCO cor: 20.62 ml/min/mmHg
DLCO unc % pred: 67 %
DLCO unc: 19 ml/min/mmHg
FEF 25-75 Post: 1.64 L/sec
FEF 25-75 Pre: 1.83 L/sec
FEF2575-%Change-Post: -10 %
FEF2575-%Pred-Post: 72 %
FEF2575-%Pred-Pre: 81 %
FEV1-%Change-Post: 0 %
FEV1-%Pred-Post: 78 %
FEV1-%Pred-Pre: 78 %
FEV1-Post: 1.84 L
FEV1-Pre: 1.84 L
FEV1FVC-%Change-Post: 1 %
FEV1FVC-%Pred-Pre: 92 %
FEV6-%Change-Post: 8 %
FEV6-%Pred-Post: 86 %
FEV6-%Pred-Pre: 79 %
FEV6-Post: 2.48 L
FEV6-Pre: 2.29 L
FEV6FVC-%Pred-Post: 103 %
FEV6FVC-%Pred-Pre: 103 %
FVC-%Change-Post: -1 %
FVC-%Pred-Post: 83 %
FVC-%Pred-Pre: 84 %
FVC-Post: 2.48 L
FVC-Pre: 2.52 L
Post FEV1/FVC ratio: 74 %
Post FEV6/FVC ratio: 100 %
Pre FEV1/FVC ratio: 73 %
Pre FEV6/FVC Ratio: 100 %
RV % pred: 46 %
RV: 1 L
TLC % pred: 64 %
TLC: 3.54 L

## 2017-01-29 LAB — TYPE AND SCREEN
ABO/RH(D): O POS
Antibody Screen: NEGATIVE

## 2017-01-29 LAB — CBC
HCT: 32.9 % — ABNORMAL LOW (ref 36.0–46.0)
Hemoglobin: 11.1 g/dL — ABNORMAL LOW (ref 12.0–15.0)
MCH: 34.3 pg — ABNORMAL HIGH (ref 26.0–34.0)
MCHC: 33.7 g/dL (ref 30.0–36.0)
MCV: 101.5 fL — ABNORMAL HIGH (ref 78.0–100.0)
Platelets: 141 10*3/uL — ABNORMAL LOW (ref 150–400)
RBC: 3.24 MIL/uL — ABNORMAL LOW (ref 3.87–5.11)
RDW: 15.8 % — ABNORMAL HIGH (ref 11.5–15.5)
WBC: 4.3 10*3/uL (ref 4.0–10.5)

## 2017-01-29 LAB — ABO/RH: ABO/RH(D): O POS

## 2017-01-29 LAB — HEPARIN LEVEL (UNFRACTIONATED): Heparin Unfractionated: 0.5 IU/mL (ref 0.30–0.70)

## 2017-01-29 MED ORDER — MAGNESIUM SULFATE 50 % IJ SOLN
40.0000 meq | INTRAMUSCULAR | Status: DC
  Filled 2017-01-29: qty 9.85

## 2017-01-29 MED ORDER — TRANEXAMIC ACID (OHS) PUMP PRIME SOLUTION
2.0000 mg/kg | INTRAVENOUS | Status: DC
  Filled 2017-01-29: qty 2.28

## 2017-01-29 MED ORDER — DOPAMINE-DEXTROSE 3.2-5 MG/ML-% IV SOLN
0.0000 ug/kg/min | INTRAVENOUS | Status: DC
  Filled 2017-01-29: qty 250

## 2017-01-29 MED ORDER — VANCOMYCIN HCL 10 G IV SOLR
1250.0000 mg | INTRAVENOUS | Status: AC
  Administered 2017-01-30: 1250 mg via INTRAVENOUS
  Filled 2017-01-29: qty 1250

## 2017-01-29 MED ORDER — DEXTROSE 5 % IV SOLN
1.5000 g | INTRAVENOUS | Status: AC
  Administered 2017-01-30: 1.5 g via INTRAVENOUS
  Filled 2017-01-29: qty 1.5

## 2017-01-29 MED ORDER — CHLORHEXIDINE GLUCONATE 0.12 % MT SOLN
15.0000 mL | Freq: Once | OROMUCOSAL | Status: AC
  Administered 2017-01-30: 15 mL via OROMUCOSAL
  Filled 2017-01-29: qty 15

## 2017-01-29 MED ORDER — ALBUTEROL SULFATE (2.5 MG/3ML) 0.083% IN NEBU
2.5000 mg | INHALATION_SOLUTION | Freq: Once | RESPIRATORY_TRACT | Status: AC
  Administered 2017-01-29: 2.5 mg via RESPIRATORY_TRACT

## 2017-01-29 MED ORDER — DIAZEPAM 5 MG PO TABS
5.0000 mg | ORAL_TABLET | Freq: Once | ORAL | Status: AC
  Administered 2017-01-30: 5 mg via ORAL
  Filled 2017-01-29: qty 1

## 2017-01-29 MED ORDER — TRANEXAMIC ACID (OHS) BOLUS VIA INFUSION
15.0000 mg/kg | INTRAVENOUS | Status: AC
  Administered 2017-01-30: 1707 mg via INTRAVENOUS
  Filled 2017-01-29: qty 1707

## 2017-01-29 MED ORDER — TRANEXAMIC ACID 1000 MG/10ML IV SOLN
1.5000 mg/kg/h | INTRAVENOUS | Status: AC
  Administered 2017-01-30: 1.5 mg/kg/h via INTRAVENOUS
  Filled 2017-01-29: qty 25

## 2017-01-29 MED ORDER — ALPRAZOLAM 0.25 MG PO TABS
0.2500 mg | ORAL_TABLET | ORAL | Status: DC | PRN
  Administered 2017-01-30: 0.25 mg via ORAL
  Filled 2017-01-29: qty 1

## 2017-01-29 MED ORDER — SODIUM CHLORIDE 0.9 % IV SOLN
INTRAVENOUS | Status: DC
  Filled 2017-01-29: qty 30

## 2017-01-29 MED ORDER — DEXTROSE 5 % IV SOLN
750.0000 mg | INTRAVENOUS | Status: AC
  Administered 2017-01-30: 750 mg via INTRAVENOUS
  Filled 2017-01-29: qty 750

## 2017-01-29 MED ORDER — PLASMA-LYTE 148 IV SOLN
INTRAVENOUS | Status: DC
  Filled 2017-01-29: qty 2.5

## 2017-01-29 MED ORDER — SODIUM CHLORIDE 0.9 % IV SOLN
30.0000 ug/min | INTRAVENOUS | Status: AC
  Administered 2017-01-30: 20 ug/min via INTRAVENOUS
  Filled 2017-01-29: qty 2

## 2017-01-29 MED ORDER — DEXMEDETOMIDINE HCL IN NACL 400 MCG/100ML IV SOLN
0.1000 ug/kg/h | INTRAVENOUS | Status: AC
  Administered 2017-01-30: .5 ug/kg/h via INTRAVENOUS
  Filled 2017-01-29: qty 100

## 2017-01-29 MED ORDER — EPINEPHRINE PF 1 MG/ML IJ SOLN
0.0000 ug/min | INTRAVENOUS | Status: DC
  Filled 2017-01-29: qty 4

## 2017-01-29 MED ORDER — METOPROLOL TARTRATE 12.5 MG HALF TABLET
12.5000 mg | ORAL_TABLET | Freq: Once | ORAL | Status: DC
  Filled 2017-01-29: qty 1

## 2017-01-29 MED ORDER — BISACODYL 5 MG PO TBEC
5.0000 mg | DELAYED_RELEASE_TABLET | Freq: Once | ORAL | Status: AC
  Administered 2017-01-29: 5 mg via ORAL
  Filled 2017-01-29: qty 1

## 2017-01-29 MED ORDER — TEMAZEPAM 7.5 MG PO CAPS
15.0000 mg | ORAL_CAPSULE | Freq: Once | ORAL | Status: AC | PRN
  Administered 2017-01-29: 15 mg via ORAL
  Filled 2017-01-29: qty 2

## 2017-01-29 MED ORDER — CHLORHEXIDINE GLUCONATE CLOTH 2 % EX PADS
6.0000 | MEDICATED_PAD | Freq: Once | CUTANEOUS | Status: AC
  Administered 2017-01-30: 6 via TOPICAL

## 2017-01-29 MED ORDER — POTASSIUM CHLORIDE 2 MEQ/ML IV SOLN
80.0000 meq | INTRAVENOUS | Status: DC
Start: 2017-01-30 — End: 2017-01-30
  Filled 2017-01-29: qty 40

## 2017-01-29 MED ORDER — SODIUM CHLORIDE 0.9 % IV SOLN
INTRAVENOUS | Status: AC
  Administered 2017-01-30: .7 [IU]/h via INTRAVENOUS
  Filled 2017-01-29: qty 1

## 2017-01-29 MED ORDER — CHLORHEXIDINE GLUCONATE CLOTH 2 % EX PADS
6.0000 | MEDICATED_PAD | Freq: Once | CUTANEOUS | Status: AC
  Administered 2017-01-29: 6 via TOPICAL

## 2017-01-29 MED ORDER — NITROGLYCERIN IN D5W 200-5 MCG/ML-% IV SOLN
2.0000 ug/min | INTRAVENOUS | Status: AC
  Administered 2017-01-30: 10 ug/min via INTRAVENOUS
  Filled 2017-01-29: qty 250

## 2017-01-29 NOTE — Plan of Care (Signed)
Patient received education on purpose of CHG baths to reduce microbial presence on skin prior to surgery. Patient also educated on need for anticoagulant therapy (heparin drip) prior to cardiac surgery. Patient scheduled for CABG in am on 01/30/17.

## 2017-01-29 NOTE — Progress Notes (Signed)
Progress Note  Patient Name: [name] Date of Encounter: 01/29/2017  Primary Cardiologist: Angelena Form  Subjective   Feeling well. Planned for CABG in the am.   Inpatient Medications    Scheduled Meds: . aspirin  81 mg Oral Daily  . atenolol  50 mg Oral Daily  . azaTHIOprine  50 mg Oral BID  . calcium carbonate  1 tablet Oral BID WC  . [START ON 01/30/2017] chlorhexidine  15 mL Mouth/Throat Once  . Chlorhexidine Gluconate Cloth  6 each Topical Once   And  . Chlorhexidine Gluconate Cloth  6 each Topical Once  . [START ON 01/30/2017] heparin-papaverine-plasmalyte irrigation   Irrigation To OR  . hydroxychloroquine  200 mg Oral Daily  . isosorbide mononitrate  30 mg Oral Daily  . levothyroxine  50 mcg Oral QAC breakfast  . losartan  100 mg Oral Daily  . [START ON 01/30/2017] magnesium sulfate  40 mEq Other To OR  . methocarbamol  750 mg Oral QID  . pantoprazole  40 mg Oral Daily  . [START ON 01/30/2017] potassium chloride  80 mEq Other To OR  . rosuvastatin  40 mg Oral q1800  . sodium chloride flush  3 mL Intravenous Q12H  . [START ON 01/30/2017] tranexamic acid  15 mg/kg Intravenous To OR  . [START ON 01/30/2017] tranexamic acid  2 mg/kg Intracatheter To OR   Continuous Infusions: . sodium chloride    . [START ON 01/30/2017] cefUROXime (ZINACEF)  IV    . [START ON 01/30/2017] cefUROXime (ZINACEF)  IV    . [START ON 01/30/2017] dexmedetomidine    . [START ON 01/30/2017] DOPamine    . [START ON 01/30/2017] epinephrine    . [START ON 01/30/2017] heparin 30,000 units/NS 1000 mL solution for CELLSAVER    . heparin 1,000 Units/hr (01/29/17 1310)  . [START ON 01/30/2017] insulin (NOVOLIN-R) infusion    . [START ON 01/30/2017] nitroGLYCERIN    . [START ON 01/30/2017] phenylephrine 20mg /270mL NS (0.08mg /ml) infusion    . [START ON 01/30/2017] tranexamic acid (CYKLOKAPRON) infusion ([facility])    . [START ON 01/30/2017] vancomycin     PRN Meds: sodium chloride, acetaminophen, diphenhydrAMINE,  nitroGLYCERIN, ondansetron (ZOFRAN) IV, sodium chloride flush, traMADol   Vital Signs    Vitals:   01/29/17 0310 01/29/17 0400 01/29/17 0839 01/29/17 0850  BP: 127/84  117/77   Pulse: (!) 55  62 60  Resp: 15 14 18    Temp: 98.2 F (36.8 C)  98.6 F (37 C)   TempSrc: Oral  Oral   SpO2: 96%  95%   Weight:      Height:        Intake/Output Summary (Last 24 hours) at 01/29/2017 1405 Last data filed at 01/29/2017 0600 Gross per 24 hour  Intake 350 ml  Output 1200 ml  Net -850 ml   Filed Weights   01/24/17 0435 01/27/17 1359 01/29/17 0308  Weight: 245 lb 11.2 oz (111.4 kg) 245 lb (111.1 kg) 250 lb 12.8 oz (113.8 kg)    Telemetry    SB - Personally Reviewed  Physical Exam   General: Well developed, well nourished, female appearing in no acute distress. Head: Normocephalic, atraumatic.  Neck: Supple without bruits, JVD. Lungs:  Resp regular and unlabored, CTA. Heart: RRR, S1, S2, no S3, S4, soft systolic murmur; no rub. Abdomen: Soft, non-tender, non-distended with normoactive bowel sounds. No hepatomegaly. No rebound/guarding. No obvious abdominal masses. Extremities: No clubbing, cyanosis, edema. Distal pedal pulses are 2+ bilaterally. Neuro:  Alert and oriented X 3. Moves all extremities spontaneously. Psych: Normal affect.  Labs    Chemistry Recent Labs  Lab 01/26/17 0301  NA 139  K 3.8  CL 110  CO2 22  GLUCOSE 94  BUN 10  CREATININE 0.98  CALCIUM 8.3*  GFRNONAA >60  GFRAA >60  ANIONGAP 7     Hematology Recent Labs  Lab 01/27/17 0243 01/28/17 0211 01/29/17 0540  WBC 3.9* 4.6 4.3  RBC 3.28* 3.37* 3.24*  HGB 11.2* 11.5* 11.1*  HCT 32.9* 34.0* 32.9*  MCV 100.3* 100.9* 101.5*  MCH 34.1* 34.1* 34.3*  MCHC 34.0 33.8 33.7  RDW 15.2 15.2 15.8*  PLT 137* 130* 141*    Cardiac EnzymesNo results for input(s): TROPONINI in the last 168 hours. No results for input(s): TROPIPOC in the last 168 hours.   BNPNo results for input(s): BNP, PROBNP in the last  168 hours.   DDimer No results for input(s): DDIMER in the last 168 hours.    Radiology    No results found.  Cardiac Studies   Echo 01/25/17 Study Conclusions  - Left ventricle: The cavity size was normal. Systolic function was normal. The estimated ejection fraction was in the range of 55% to 60%. Wall motion was normal; there were no regional wall motion abnormalities. Features are consistent with a pseudonormal left ventricular filling pattern, with concomitant abnormal relaxation and increased filling pressure (grade 2 diastolic dysfunction). Doppler parameters are consistent with elevated ventricular end-diastolic filling pressure. - Aortic valve: There was mild regurgitation. - Mitral valve: There was severe regurgitation. - Left atrium: The atrium was moderately dilated. - Tricuspid valve: There was severe regurgitation. - Pulmonary arteries: Systolic pressure was moderately increased. PA peak pressure: 52 mm Hg (S).  Impressions:  - Mitral regurgitation is now severe, there is moderatel pulmonary hypertension.   Cath 01/23/17 LEFT HEART CATH AND CORONARY ANGIOGRAPHY  Conclusion    Severe three-vessel coronary disease with chronic total occlusion of the right coronary due to in-stent restenosis, 85% de novo stenosis in the proximal LAD, total occlusion of the large first diagonal, 70% proximal margin restenosis of mid circumflex stent and 80% stenosis in the first obtuse marginal which is crossed by the stent.  Severe inferior wall hypokinesis. EF 45-50%. Elevated LVEDP consistent with chronic combined systolic and diastolic heart failure  RECOMMENDATIONS:   The patient has been having pain at rest. I will admit her to the hospital, start IV heparin, titrate therapy for blood pressure control, and get an in-hospital consultation for consideration of surgery   TEE: 01/27/17  Study Conclusions  - Left ventricle: The cavity size  was normal. Wall thickness was normal. Systolic function was normal. The estimated ejection fraction was in the range of 55% to 60%. Wall motion was normal; there were no regional wall motion abnormalities. - Aortic valve: No evidence of vegetation. There was mild regurgitation directed centrally in the LVOT. - Mitral valve: There was malcoaptation of the valve leaflets. No evidence of vegetation. Valve area by continuity equation (using LVOT flow): 3.37 cm^2. - Left atrium: The atrium was dilated. No evidence of thrombus in the atrial cavity or appendage. No evidence of thrombus in the appendage. - Right atrium: The atrium was dilated. - Atrial septum: No defect or patent foramen ovale was identified. - Tricuspid valve: No evidence of vegetation. - Pulmonic valve: No evidence of vegetation.  Impressions:  - Severity of MR appears to be moderate. There is no evidence of flow reversal in either  right or left pulmonary veins. ERO calculation and regurgitant volume fraction by both PISA and continuity equation are in the moderate range. Color Doppler overestimates the severity of MR due to the central nature of the jet and the HTN-driven high velocity of the jet (SBP during the test was 170 mm Hg).  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 She is a former pt of Dr. Melvern Banker. She is now followed by Dr. Angelena Form. She presented to clinic with complaints of left sided neck, jaw and shoulder/ arm pain. Feels like tightness. Can occur at rest. Feels similar to previous angina, however no associated n/v or diaphoresis like she had in 2003. Symptoms have gradually gotten worse over the last several  weeks. No relief with antacids. She has had some mild exertional dyspnea.  Assessment & Plan    1. Unstable angina/CAD: Cath showedsevere three-vessel coronary disease with chronic total occlusion of the RCA due to in-stent restenosis, 85% proximal LAD stenosis, occluded first diagonal, 70% proximal marginal restenosis of the mid left circumflex and 80% stenosis of the first OM. -- For CABG tomorrow.. --Continue ASA, heparin, BB, statin, ARB and Imdur   2. Severe MR: Reported as severe on echo, but TEE yesterday reports as moderate.   3. Carotid artery calcification:  -- bilateral 1-39% ICA stenosis per dopplers yesterday  4. HLD: -01/26/2017: Cholesterol 110; HDL 46; LDL Cholesterol 52; Triglycerides 58; VLDL 12 - Increased Crestor to 40mg  qd  5. HTN:  - BP stable on current medication.  Signed, Reino Bellis, NP  01/29/2017, 2:05 PM  Pager # [phone]    Patient seen and examined. Agree with assessment and plan. Feels well; no recurrent chest pain. No JVD; lungs clear, 1-2 systolic murmur, BS+, no edema.  For CABG tomorrow with ? MV annuloplasty or repair per Dr. Cyndia Bent.    Troy Sine, MD, [facility] 01/29/2017 2:11 PM   For questions or updates, please contact [facility] Please consult [email or web address] for contact info under Cardiology/STEMI.

## 2017-01-29 NOTE — Plan of Care (Signed)
  Pain Managment: General experience of comfort will improve 01/28/2017 2359 - Progressing by Blair Promise, RN   Cardiac: Hemodynamic stability will improve 01/28/2017 2359 - Progressing by Blair Promise, RN Ability to maintain an adequate cardiac output will improve 01/28/2017 2359 - Progressing by Blair Promise, RN

## 2017-01-29 NOTE — Progress Notes (Signed)
ANTICOAGULATION CONSULT NOTE - Follow Up Consult  Pharmacy Consult for Heparin Indication: 3V CAD  No Known Allergies  Patient Measurements: Height: 5\' 7"  (170.2 cm) Weight: 250 lb 12.8 oz (113.8 kg) IBW/kg (Calculated) : 61.6 Heparin Dosing Weight: 83 kg  Vital Signs: Temp: 98.6 F (37 C) (11/08 0839) Temp Source: Oral (11/08 0839) BP: 117/77 (11/08 0839) Pulse Rate: 60 (11/08 0850)  Labs: Recent Labs    01/27/17 0243 01/28/17 0211 01/29/17 0540  HGB 11.2* 11.5* 11.1*  HCT 32.9* 34.0* 32.9*  PLT 137* 130* 141*  HEPARINUNFRC 0.62 0.44 0.50    Estimated Creatinine Clearance: 78.5 mL/min (by C-G formula based on SCr of 0.98 mg/dL).  Assessment:  61 yo female with severe 3V CAD on heparin with plans for CABG on 01/30/17.   Heparin level remains therapeutic (0.50) on 1000 units/hr.  No further drop in platelet count. No bleeding reported.   Goal of Therapy:  Heparin level 0.3-0.7 units/ml Monitor platelets by anticoagulation protocol: Yes   Plan:   Continue heparin drip at 1000 units/hr.  Daily heparin level and CBC while on heparin.  For CABG in am.  Arty Baumgartner, [place] Pager: [phone] 01/29/2017,11:01 AM

## 2017-01-30 ENCOUNTER — Inpatient Hospital Stay (HOSPITAL_COMMUNITY)
Admission: AD | Disposition: A | Discharge status: Home / Self Care | Payer: Self-pay | Source: Ambulatory Visit | Attending: Surgery

## 2017-01-30 ENCOUNTER — Encounter (HOSPITAL_COMMUNITY): Payer: Self-pay | Admitting: Student

## 2017-01-30 ENCOUNTER — Inpatient Hospital Stay (HOSPITAL_COMMUNITY): Payer: BLUE CROSS/BLUE SHIELD

## 2017-01-30 ENCOUNTER — Inpatient Hospital Stay (HOSPITAL_COMMUNITY): Payer: BLUE CROSS/BLUE SHIELD | Admitting: Anesthesiology

## 2017-01-30 DIAGNOSIS — Z9889 Other specified postprocedural states: Secondary | ICD-10-CM

## 2017-01-30 DIAGNOSIS — I2510 Atherosclerotic heart disease of native coronary artery without angina pectoris: Secondary | ICD-10-CM | POA: Diagnosis present

## 2017-01-30 DIAGNOSIS — Z951 Presence of aortocoronary bypass graft: Secondary | ICD-10-CM

## 2017-01-30 DIAGNOSIS — I341 Nonrheumatic mitral (valve) prolapse: Secondary | ICD-10-CM

## 2017-01-30 HISTORY — PX: CORONARY ARTERY BYPASS GRAFT: SHX141

## 2017-01-30 HISTORY — PX: TEE WITHOUT CARDIOVERSION: SHX5443

## 2017-01-30 HISTORY — PX: MITRAL VALVE REPAIR: SHX2039

## 2017-01-30 LAB — POCT I-STAT 3, ART BLOOD GAS (G3+)
Acid-Base Excess: 4 mmol/L — ABNORMAL HIGH (ref 0.0–2.0)
Acid-base deficit: 4 mmol/L — ABNORMAL HIGH (ref 0.0–2.0)
Acid-base deficit: 4 mmol/L — ABNORMAL HIGH (ref 0.0–2.0)
Acid-base deficit: 3 mmol/L — ABNORMAL HIGH (ref 0.0–2.0)
Bicarbonate: 27.2 mmol/L (ref 20.0–28.0)
Bicarbonate: 20.5 mmol/L (ref 20.0–28.0)
Bicarbonate: 20.3 mmol/L (ref 20.0–28.0)
Bicarbonate: 22 mmol/L (ref 20.0–28.0)
O2 Saturation: 100 %
O2 Saturation: 93 %
O2 Saturation: 89 %
O2 Saturation: 98 %
Patient temperature: 36.6
Patient temperature: 37
Patient temperature: 36.7
TCO2: 28 mmol/L (ref 22–32)
TCO2: 22 mmol/L (ref 22–32)
TCO2: 21 mmol/L — ABNORMAL LOW (ref 22–32)
TCO2: 23 mmol/L (ref 22–32)
pCO2 arterial: 33.1 mmHg (ref 32.0–48.0)
pCO2 arterial: 35 mmHg (ref 32.0–48.0)
pCO2 arterial: 32.9 mmHg (ref 32.0–48.0)
pCO2 arterial: 37.9 mmHg (ref 32.0–48.0)
pH, Arterial: 7.522 — ABNORMAL HIGH (ref 7.350–7.450)
pH, Arterial: 7.374 (ref 7.350–7.450)
pH, Arterial: 7.4 (ref 7.350–7.450)
pH, Arterial: 7.371 (ref 7.350–7.450)
pO2, Arterial: 365 mmHg — ABNORMAL HIGH (ref 83.0–108.0)
pO2, Arterial: 67 mmHg — ABNORMAL LOW (ref 83.0–108.0)
pO2, Arterial: 55 mmHg — ABNORMAL LOW (ref 83.0–108.0)
pO2, Arterial: 98 mmHg (ref 83.0–108.0)

## 2017-01-30 LAB — POCT I-STAT, CHEM 8
BUN: 11 mg/dL (ref 6–20)
BUN: 11 mg/dL (ref 6–20)
BUN: 11 mg/dL (ref 6–20)
BUN: 9 mg/dL (ref 6–20)
BUN: 10 mg/dL (ref 6–20)
BUN: 10 mg/dL (ref 6–20)
BUN: 10 mg/dL (ref 6–20)
BUN: 10 mg/dL (ref 6–20)
Calcium, Ion: 1.26 mmol/L (ref 1.15–1.40)
Calcium, Ion: 1.18 mmol/L (ref 1.15–1.40)
Calcium, Ion: 1.19 mmol/L (ref 1.15–1.40)
Calcium, Ion: 0.95 mmol/L — ABNORMAL LOW (ref 1.15–1.40)
Calcium, Ion: 1.05 mmol/L — ABNORMAL LOW (ref 1.15–1.40)
Calcium, Ion: 1.05 mmol/L — ABNORMAL LOW (ref 1.15–1.40)
Calcium, Ion: 1.03 mmol/L — ABNORMAL LOW (ref 1.15–1.40)
Calcium, Ion: 1.11 mmol/L — ABNORMAL LOW (ref 1.15–1.40)
Chloride: 105 mmol/L (ref 101–111)
Chloride: 106 mmol/L (ref 101–111)
Chloride: 106 mmol/L (ref 101–111)
Chloride: 103 mmol/L (ref 101–111)
Chloride: 103 mmol/L (ref 101–111)
Chloride: 105 mmol/L (ref 101–111)
Chloride: 106 mmol/L (ref 101–111)
Chloride: 109 mmol/L (ref 101–111)
Creatinine, Ser: 0.8 mg/dL (ref 0.44–1.00)
Creatinine, Ser: 0.8 mg/dL (ref 0.44–1.00)
Creatinine, Ser: 0.8 mg/dL (ref 0.44–1.00)
Creatinine, Ser: 0.7 mg/dL (ref 0.44–1.00)
Creatinine, Ser: 0.7 mg/dL (ref 0.44–1.00)
Creatinine, Ser: 0.7 mg/dL (ref 0.44–1.00)
Creatinine, Ser: 0.8 mg/dL (ref 0.44–1.00)
Creatinine, Ser: 0.9 mg/dL (ref 0.44–1.00)
Glucose, Bld: 96 mg/dL (ref 65–99)
Glucose, Bld: 97 mg/dL (ref 65–99)
Glucose, Bld: 97 mg/dL (ref 65–99)
Glucose, Bld: 89 mg/dL (ref 65–99)
Glucose, Bld: 136 mg/dL — ABNORMAL HIGH (ref 65–99)
Glucose, Bld: 148 mg/dL — ABNORMAL HIGH (ref 65–99)
Glucose, Bld: 125 mg/dL — ABNORMAL HIGH (ref 65–99)
Glucose, Bld: 145 mg/dL — ABNORMAL HIGH (ref 65–99)
HCT: 33 % — ABNORMAL LOW (ref 36.0–46.0)
HCT: 31 % — ABNORMAL LOW (ref 36.0–46.0)
HCT: 31 % — ABNORMAL LOW (ref 36.0–46.0)
HCT: 26 % — ABNORMAL LOW (ref 36.0–46.0)
HCT: 26 % — ABNORMAL LOW (ref 36.0–46.0)
HCT: 25 % — ABNORMAL LOW (ref 36.0–46.0)
HCT: 24 % — ABNORMAL LOW (ref 36.0–46.0)
HCT: 27 % — ABNORMAL LOW (ref 36.0–46.0)
Hemoglobin: 11.2 g/dL — ABNORMAL LOW (ref 12.0–15.0)
Hemoglobin: 10.5 g/dL — ABNORMAL LOW (ref 12.0–15.0)
Hemoglobin: 10.5 g/dL — ABNORMAL LOW (ref 12.0–15.0)
Hemoglobin: 8.8 g/dL — ABNORMAL LOW (ref 12.0–15.0)
Hemoglobin: 8.8 g/dL — ABNORMAL LOW (ref 12.0–15.0)
Hemoglobin: 8.5 g/dL — ABNORMAL LOW (ref 12.0–15.0)
Hemoglobin: 8.2 g/dL — ABNORMAL LOW (ref 12.0–15.0)
Hemoglobin: 9.2 g/dL — ABNORMAL LOW (ref 12.0–15.0)
Potassium: 3.8 mmol/L (ref 3.5–5.1)
Potassium: 3.6 mmol/L (ref 3.5–5.1)
Potassium: 3.6 mmol/L (ref 3.5–5.1)
Potassium: 3.7 mmol/L (ref 3.5–5.1)
Potassium: 4.1 mmol/L (ref 3.5–5.1)
Potassium: 4.5 mmol/L (ref 3.5–5.1)
Potassium: 4 mmol/L (ref 3.5–5.1)
Potassium: 4.3 mmol/L (ref 3.5–5.1)
Sodium: 141 mmol/L (ref 135–145)
Sodium: 140 mmol/L (ref 135–145)
Sodium: 141 mmol/L (ref 135–145)
Sodium: 141 mmol/L (ref 135–145)
Sodium: 138 mmol/L (ref 135–145)
Sodium: 138 mmol/L (ref 135–145)
Sodium: 141 mmol/L (ref 135–145)
Sodium: 141 mmol/L (ref 135–145)
TCO2: 27 mmol/L (ref 22–32)
TCO2: 27 mmol/L (ref 22–32)
TCO2: 26 mmol/L (ref 22–32)
TCO2: 27 mmol/L (ref 22–32)
TCO2: 26 mmol/L (ref 22–32)
TCO2: 26 mmol/L (ref 22–32)
TCO2: 25 mmol/L (ref 22–32)
TCO2: 20 mmol/L — ABNORMAL LOW (ref 22–32)

## 2017-01-30 LAB — CBC
HCT: 36.2 % (ref 36.0–46.0)
HCT: 32.8 % — ABNORMAL LOW (ref 36.0–46.0)
HCT: 28.6 % — ABNORMAL LOW (ref 36.0–46.0)
Hemoglobin: 12.2 g/dL (ref 12.0–15.0)
Hemoglobin: 11.3 g/dL — ABNORMAL LOW (ref 12.0–15.0)
Hemoglobin: 9.8 g/dL — ABNORMAL LOW (ref 12.0–15.0)
MCH: 34.3 pg — ABNORMAL HIGH (ref 26.0–34.0)
MCH: 34.1 pg — ABNORMAL HIGH (ref 26.0–34.0)
MCH: 34 pg (ref 26.0–34.0)
MCHC: 33.7 g/dL (ref 30.0–36.0)
MCHC: 34.5 g/dL (ref 30.0–36.0)
MCHC: 34.3 g/dL (ref 30.0–36.0)
MCV: 101.7 fL — ABNORMAL HIGH (ref 78.0–100.0)
MCV: 99.1 fL (ref 78.0–100.0)
MCV: 99.3 fL (ref 78.0–100.0)
Platelets: 144 10*3/uL — ABNORMAL LOW (ref 150–400)
Platelets: 174 10*3/uL (ref 150–400)
Platelets: 168 10*3/uL (ref 150–400)
RBC: 3.56 MIL/uL — ABNORMAL LOW (ref 3.87–5.11)
RBC: 3.31 MIL/uL — ABNORMAL LOW (ref 3.87–5.11)
RBC: 2.88 MIL/uL — ABNORMAL LOW (ref 3.87–5.11)
RDW: 15.6 % — ABNORMAL HIGH (ref 11.5–15.5)
RDW: 14.7 % (ref 11.5–15.5)
RDW: 14.8 % (ref 11.5–15.5)
WBC: 4.7 10*3/uL (ref 4.0–10.5)
WBC: 10 10*3/uL (ref 4.0–10.5)
WBC: 8.5 10*3/uL (ref 4.0–10.5)

## 2017-01-30 LAB — BASIC METABOLIC PANEL
Anion gap: 8 (ref 5–15)
BUN: 11 mg/dL (ref 6–20)
CO2: 25 mmol/L (ref 22–32)
Calcium: 9.1 mg/dL (ref 8.9–10.3)
Chloride: 107 mmol/L (ref 101–111)
Creatinine, Ser: 1.03 mg/dL — ABNORMAL HIGH (ref 0.44–1.00)
GFR calc Af Amer: >60 mL/min (ref >60)
GFR calc non Af Amer: 57 mL/min — ABNORMAL LOW (ref >60)
Glucose, Bld: 91 mg/dL (ref 65–99)
Potassium: 3.7 mmol/L (ref 3.5–5.1)
Sodium: 140 mmol/L (ref 135–145)

## 2017-01-30 LAB — HEPARIN LEVEL (UNFRACTIONATED): Heparin Unfractionated: 0.49 IU/mL (ref 0.30–0.70)

## 2017-01-30 LAB — GLUCOSE, CAPILLARY
Glucose-Capillary: 136 mg/dL — ABNORMAL HIGH (ref 65–99)
Glucose-Capillary: 129 mg/dL — ABNORMAL HIGH (ref 65–99)
Glucose-Capillary: 125 mg/dL — ABNORMAL HIGH (ref 65–99)
Glucose-Capillary: 140 mg/dL — ABNORMAL HIGH (ref 65–99)

## 2017-01-30 LAB — COOXEMETRY PANEL
Carboxyhemoglobin: 0.6 % (ref 0.5–1.5)
Methemoglobin: 1.1 % (ref 0.0–1.5)
O2 Saturation: 56.1 %
Total hemoglobin: 11.2 g/dL — ABNORMAL LOW (ref 12.0–16.0)

## 2017-01-30 LAB — PLATELET COUNT: Platelets: 72 10*3/uL — ABNORMAL LOW (ref 150–400)

## 2017-01-30 LAB — POCT I-STAT 4, (NA,K, GLUC, HGB,HCT)
Glucose, Bld: 145 mg/dL — ABNORMAL HIGH (ref 65–99)
HCT: 32 % — ABNORMAL LOW (ref 36.0–46.0)
Hemoglobin: 10.9 g/dL — ABNORMAL LOW (ref 12.0–15.0)
Potassium: 3.9 mmol/L (ref 3.5–5.1)
Sodium: 145 mmol/L (ref 135–145)

## 2017-01-30 LAB — PROTIME-INR
INR: 1.5
Prothrombin Time: 18 seconds — ABNORMAL HIGH (ref 11.4–15.2)

## 2017-01-30 LAB — CREATININE, SERUM
Creatinine, Ser: 1.03 mg/dL — ABNORMAL HIGH (ref 0.44–1.00)
GFR calc Af Amer: >60 mL/min (ref >60)
GFR calc non Af Amer: 57 mL/min — ABNORMAL LOW (ref >60)

## 2017-01-30 LAB — APTT: aPTT: 37 seconds — ABNORMAL HIGH (ref 24–36)

## 2017-01-30 LAB — MAGNESIUM: Magnesium: 3.2 mg/dL — ABNORMAL HIGH (ref 1.7–2.4)

## 2017-01-30 LAB — HEMOGLOBIN AND HEMATOCRIT, BLOOD
HCT: 23.1 % — ABNORMAL LOW (ref 36.0–46.0)
Hemoglobin: 7.7 g/dL — ABNORMAL LOW (ref 12.0–15.0)

## 2017-01-30 SURGERY — CORONARY ARTERY BYPASS GRAFTING (CABG)
Anesthesia: General | Site: Chest

## 2017-01-30 MED ORDER — DEXMEDETOMIDINE HCL IN NACL 400 MCG/100ML IV SOLN
0.4000 ug/kg/h | INTRAVENOUS | Status: DC
  Filled 2017-01-30: qty 100

## 2017-01-30 MED ORDER — MORPHINE SULFATE (PF) 4 MG/ML IV SOLN
2.0000 mg | INTRAVENOUS | Status: DC | PRN
  Administered 2017-01-31 (×2): 2 mg via INTRAVENOUS
  Administered 2017-02-01: 4 mg via INTRAVENOUS
  Administered 2017-02-05: 2 mg via INTRAVENOUS
  Filled 2017-01-30 (×4): qty 1

## 2017-01-30 MED ORDER — ALUM & MAG HYDROXIDE-SIMETH 200-200-20 MG/5ML PO SUSP
30.0000 mL | ORAL | Status: DC | PRN
  Filled 2017-01-30: qty 30

## 2017-01-30 MED ORDER — PROTAMINE SULFATE 10 MG/ML IV SOLN
INTRAVENOUS | Status: DC | PRN
  Administered 2017-01-30: 300 mg via INTRAVENOUS

## 2017-01-30 MED ORDER — TRAMADOL HCL 50 MG PO TABS
50.0000 mg | ORAL_TABLET | ORAL | Status: DC | PRN
  Administered 2017-01-31 – 2017-02-02 (×2): 100 mg via ORAL
  Administered 2017-02-02: 50 mg via ORAL
  Administered 2017-02-04: 100 mg via ORAL
  Filled 2017-01-30: qty 1
  Filled 2017-01-30 (×3): qty 2

## 2017-01-30 MED ORDER — DOPAMINE-DEXTROSE 3.2-5 MG/ML-% IV SOLN
2.0000 ug/kg/min | INTRAVENOUS | Status: DC
  Filled 2017-01-30: qty 250

## 2017-01-30 MED ORDER — FENTANYL CITRATE (PF) 250 MCG/5ML IJ SOLN
INTRAMUSCULAR | Status: AC
  Filled 2017-01-30: qty 30

## 2017-01-30 MED ORDER — MIDAZOLAM HCL 5 MG/5ML IJ SOLN
INTRAMUSCULAR | Status: DC | PRN
  Administered 2017-01-30: 3 mg via INTRAVENOUS
  Administered 2017-01-30: 2 mg via INTRAVENOUS

## 2017-01-30 MED ORDER — DEXTROSE 5 % IV SOLN
1.5000 g | Freq: Two times a day (BID) | INTRAVENOUS | Status: AC
  Administered 2017-01-30 – 2017-02-01 (×4): 1.5 g via INTRAVENOUS
  Filled 2017-01-30 (×4): qty 1.5

## 2017-01-30 MED ORDER — FAMOTIDINE IN NACL 20-0.9 MG/50ML-% IV SOLN
20.0000 mg | Freq: Two times a day (BID) | INTRAVENOUS | Status: AC
  Administered 2017-01-30 – 2017-01-31 (×2): 20 mg via INTRAVENOUS
  Filled 2017-01-30: qty 50

## 2017-01-30 MED ORDER — HEPARIN SODIUM (PORCINE) 1000 UNIT/ML IJ SOLN
INTRAMUSCULAR | Status: DC | PRN
  Administered 2017-01-30: 33000 [IU] via INTRAVENOUS

## 2017-01-30 MED ORDER — ACETAMINOPHEN 500 MG PO TABS
1000.0000 mg | ORAL_TABLET | Freq: Four times a day (QID) | ORAL | Status: AC
  Administered 2017-01-31 – 2017-02-05 (×13): 1000 mg via ORAL
  Filled 2017-01-30 (×15): qty 2

## 2017-01-30 MED ORDER — METOPROLOL TARTRATE 12.5 MG HALF TABLET
12.5000 mg | ORAL_TABLET | Freq: Two times a day (BID) | ORAL | Status: DC
  Filled 2017-01-30 (×2): qty 1

## 2017-01-30 MED ORDER — SODIUM CHLORIDE 0.45 % IV SOLN
INTRAVENOUS | Status: DC | PRN
  Administered 2017-01-30: 16:00:00 via INTRAVENOUS

## 2017-01-30 MED ORDER — DEXAMETHASONE SODIUM PHOSPHATE 10 MG/ML IJ SOLN
INTRAMUSCULAR | Status: AC
  Filled 2017-01-30: qty 1

## 2017-01-30 MED ORDER — PROPOFOL 10 MG/ML IV BOLUS
INTRAVENOUS | Status: AC
  Filled 2017-01-30: qty 20

## 2017-01-30 MED ORDER — HEPARIN SODIUM (PORCINE) 1000 UNIT/ML IJ SOLN
INTRAMUSCULAR | Status: AC
  Filled 2017-01-30: qty 1

## 2017-01-30 MED ORDER — THROMBIN (RECOMBINANT) 20000 UNITS EX SOLR
OROMUCOSAL | Status: DC | PRN
  Administered 2017-01-30 (×3): via TOPICAL

## 2017-01-30 MED ORDER — PROPOFOL 10 MG/ML IV BOLUS
INTRAVENOUS | Status: DC | PRN
  Administered 2017-01-30: 10 mg via INTRAVENOUS
  Administered 2017-01-30: 20 mg via INTRAVENOUS
  Administered 2017-01-30: 30 mg via INTRAVENOUS
  Administered 2017-01-30: 60 mg via INTRAVENOUS

## 2017-01-30 MED ORDER — ALBUMIN HUMAN 5 % IV SOLN
INTRAVENOUS | Status: DC | PRN
  Administered 2017-01-30: 15:00:00 via INTRAVENOUS

## 2017-01-30 MED ORDER — DEXAMETHASONE SODIUM PHOSPHATE 10 MG/ML IJ SOLN
INTRAMUSCULAR | Status: DC | PRN
  Administered 2017-01-30: 10 mg via INTRAVENOUS

## 2017-01-30 MED ORDER — FENTANYL CITRATE (PF) 250 MCG/5ML IJ SOLN
INTRAMUSCULAR | Status: AC
  Filled 2017-01-30: qty 5

## 2017-01-30 MED ORDER — BISACODYL 5 MG PO TBEC
10.0000 mg | DELAYED_RELEASE_TABLET | Freq: Every day | ORAL | Status: DC
  Administered 2017-01-31 – 2017-02-02 (×3): 10 mg via ORAL
  Filled 2017-01-30 (×3): qty 2

## 2017-01-30 MED ORDER — ROCURONIUM BROMIDE 10 MG/ML (PF) SYRINGE
PREFILLED_SYRINGE | INTRAVENOUS | Status: AC
  Filled 2017-01-30: qty 5

## 2017-01-30 MED ORDER — HEMOSTATIC AGENTS (NO CHARGE) OPTIME
TOPICAL | Status: DC | PRN
  Administered 2017-01-30 (×2): 1 via TOPICAL

## 2017-01-30 MED ORDER — FENTANYL CITRATE (PF) 250 MCG/5ML IJ SOLN
INTRAMUSCULAR | Status: DC | PRN
  Administered 2017-01-30: 250 ug via INTRAVENOUS
  Administered 2017-01-30: 200 ug via INTRAVENOUS
  Administered 2017-01-30: 250 ug via INTRAVENOUS
  Administered 2017-01-30: 150 ug via INTRAVENOUS
  Administered 2017-01-30: 250 ug via INTRAVENOUS
  Administered 2017-01-30: 100 ug via INTRAVENOUS
  Administered 2017-01-30 (×3): 250 ug via INTRAVENOUS
  Administered 2017-01-30: 50 ug via INTRAVENOUS
  Administered 2017-01-30: 250 ug via INTRAVENOUS

## 2017-01-30 MED ORDER — ROCURONIUM BROMIDE 10 MG/ML (PF) SYRINGE
PREFILLED_SYRINGE | INTRAVENOUS | Status: DC | PRN
  Administered 2017-01-30: 50 mg via INTRAVENOUS
  Administered 2017-01-30: 100 mg via INTRAVENOUS
  Administered 2017-01-30: 20 mg via INTRAVENOUS

## 2017-01-30 MED ORDER — CHLORHEXIDINE GLUCONATE 0.12 % MT SOLN
15.0000 mL | OROMUCOSAL | Status: AC
  Administered 2017-01-30: 15 mL via OROMUCOSAL

## 2017-01-30 MED ORDER — LACTATED RINGERS IV SOLN: 500.0000 mL | Freq: Once | INTRAVENOUS | Status: DC | PRN

## 2017-01-30 MED ORDER — SODIUM CHLORIDE 0.9% FLUSH: 3.0000 mL | INTRAVENOUS | Status: DC | PRN

## 2017-01-30 MED ORDER — PROTAMINE SULFATE 10 MG/ML IV SOLN
INTRAVENOUS | Status: AC
  Filled 2017-01-30: qty 5

## 2017-01-30 MED ORDER — SODIUM CHLORIDE 0.9 % IV SOLN
INTRAVENOUS | Status: DC
  Administered 2017-01-30: 16:00:00 via INTRAVENOUS

## 2017-01-30 MED ORDER — MAGNESIUM SULFATE 4 GM/100ML IV SOLN
INTRAVENOUS | Status: AC
  Administered 2017-01-30: 4 g via INTRAVENOUS
  Filled 2017-01-30: qty 100

## 2017-01-30 MED ORDER — DEXMEDETOMIDINE HCL IN NACL 400 MCG/100ML IV SOLN
0.0000 ug/kg/h | INTRAVENOUS | Status: DC
  Administered 2017-01-30: 0.5 ug/kg/h via INTRAVENOUS

## 2017-01-30 MED ORDER — SODIUM CHLORIDE 0.9 % IV SOLN
INTRAVENOUS | Status: DC
  Filled 2017-01-30: qty 1

## 2017-01-30 MED ORDER — ALBUMIN HUMAN 5 % IV SOLN
250.0000 mL | INTRAVENOUS | Status: AC | PRN
  Administered 2017-01-30 – 2017-01-31 (×4): 250 mL via INTRAVENOUS
  Filled 2017-01-30 (×2): qty 250

## 2017-01-30 MED ORDER — ROCURONIUM BROMIDE 10 MG/ML (PF) SYRINGE
PREFILLED_SYRINGE | INTRAVENOUS | Status: AC
  Filled 2017-01-30: qty 10

## 2017-01-30 MED ORDER — ASPIRIN 81 MG PO CHEW
324.0000 mg | CHEWABLE_TABLET | Freq: Every day | ORAL | Status: DC
  Filled 2017-01-30 (×2): qty 4

## 2017-01-30 MED ORDER — CHLORHEXIDINE GLUCONATE 0.12% ORAL RINSE (MEDLINE KIT)
15.0000 mL | Freq: Two times a day (BID) | OROMUCOSAL | Status: DC
  Administered 2017-01-30 – 2017-02-01 (×4): 15 mL via OROMUCOSAL

## 2017-01-30 MED ORDER — ARTIFICIAL TEARS OPHTHALMIC OINT
TOPICAL_OINTMENT | OPHTHALMIC | Status: DC | PRN
  Administered 2017-01-30: 1 via OPHTHALMIC

## 2017-01-30 MED ORDER — LABETALOL HCL 5 MG/ML IV SOLN
INTRAVENOUS | Status: AC
  Filled 2017-01-30: qty 4

## 2017-01-30 MED ORDER — LACTATED RINGERS IV SOLN
INTRAVENOUS | Status: DC | PRN
  Administered 2017-01-30: 07:00:00 via INTRAVENOUS

## 2017-01-30 MED ORDER — ACETAMINOPHEN 160 MG/5ML PO SOLN: 1000.0000 mg | Freq: Four times a day (QID) | ORAL | Status: AC

## 2017-01-30 MED ORDER — MIDAZOLAM HCL 2 MG/2ML IJ SOLN
2.0000 mg | INTRAMUSCULAR | Status: DC | PRN
  Filled 2017-01-30: qty 2

## 2017-01-30 MED ORDER — ONDANSETRON HCL 4 MG/2ML IJ SOLN
4.0000 mg | Freq: Four times a day (QID) | INTRAMUSCULAR | Status: DC | PRN
  Administered 2017-01-31 – 2017-02-09 (×12): 4 mg via INTRAVENOUS
  Filled 2017-01-30 (×13): qty 2

## 2017-01-30 MED ORDER — METOPROLOL TARTRATE 5 MG/5ML IV SOLN: 2.5000 mg | INTRAVENOUS | Status: DC | PRN

## 2017-01-30 MED ORDER — 0.9 % SODIUM CHLORIDE (POUR BTL) OPTIME
TOPICAL | Status: DC | PRN
  Administered 2017-01-30: 6000 mL

## 2017-01-30 MED ORDER — PHENYLEPHRINE HCL 10 MG/ML IJ SOLN
INTRAMUSCULAR | Status: DC | PRN
  Administered 2017-01-30 (×2): 40 ug via INTRAVENOUS

## 2017-01-30 MED ORDER — SODIUM CHLORIDE 0.9 % IJ SOLN
INTRAMUSCULAR | Status: AC
  Filled 2017-01-30: qty 20

## 2017-01-30 MED ORDER — THROMBIN (RECOMBINANT) 5000 UNITS EX SOLR
CUTANEOUS | Status: AC
Start: 2017-01-30 — End: 2017-01-30
  Filled 2017-01-30: qty 10000

## 2017-01-30 MED ORDER — ASPIRIN EC 325 MG PO TBEC
325.0000 mg | DELAYED_RELEASE_TABLET | Freq: Every day | ORAL | Status: DC
  Administered 2017-01-31 – 2017-02-04 (×5): 325 mg via ORAL
  Filled 2017-01-30 (×5): qty 1

## 2017-01-30 MED ORDER — MORPHINE SULFATE (PF) 4 MG/ML IV SOLN
1.0000 mg | INTRAVENOUS | Status: AC | PRN
  Administered 2017-01-31: 4 mg via INTRAVENOUS
  Filled 2017-01-30: qty 1

## 2017-01-30 MED ORDER — PANTOPRAZOLE SODIUM 40 MG PO TBEC
40.0000 mg | DELAYED_RELEASE_TABLET | Freq: Every day | ORAL | Status: DC
  Administered 2017-01-31 – 2017-02-11 (×12): 40 mg via ORAL
  Filled 2017-01-30 (×12): qty 1

## 2017-01-30 MED ORDER — LACTATED RINGERS IV SOLN: INTRAVENOUS | Status: DC

## 2017-01-30 MED ORDER — OXYCODONE HCL 5 MG PO TABS: 5.0000 mg | ORAL_TABLET | ORAL | Status: DC | PRN

## 2017-01-30 MED ORDER — MAGNESIUM SULFATE 4 GM/100ML IV SOLN
4.0000 g | Freq: Once | INTRAVENOUS | Status: AC
  Administered 2017-01-30: 4 g via INTRAVENOUS

## 2017-01-30 MED ORDER — BISACODYL 10 MG RE SUPP: 10.0000 mg | Freq: Every day | RECTAL | Status: DC

## 2017-01-30 MED ORDER — DOCUSATE SODIUM 100 MG PO CAPS
200.0000 mg | ORAL_CAPSULE | Freq: Every day | ORAL | Status: DC
  Administered 2017-01-31 – 2017-02-02 (×3): 200 mg via ORAL
  Filled 2017-01-30 (×3): qty 2

## 2017-01-30 MED ORDER — ACETAMINOPHEN 650 MG RE SUPP
650.0000 mg | Freq: Once | RECTAL | Status: AC
  Administered 2017-01-30: 650 mg via RECTAL

## 2017-01-30 MED ORDER — POTASSIUM CHLORIDE 10 MEQ/50ML IV SOLN
10.0000 meq | INTRAVENOUS | Status: AC
  Administered 2017-01-30 (×3): 10 meq via INTRAVENOUS

## 2017-01-30 MED ORDER — SODIUM CHLORIDE 0.9 % IV SOLN
0.0000 ug/min | INTRAVENOUS | Status: DC
  Filled 2017-01-30: qty 2

## 2017-01-30 MED ORDER — PHENYLEPHRINE 40 MCG/ML (10ML) SYRINGE FOR IV PUSH (FOR BLOOD PRESSURE SUPPORT)
PREFILLED_SYRINGE | INTRAVENOUS | Status: AC
  Filled 2017-01-30: qty 10

## 2017-01-30 MED ORDER — LACTATED RINGERS IV SOLN
INTRAVENOUS | Status: DC | PRN
  Administered 2017-01-30: 06:00:00 via INTRAVENOUS

## 2017-01-30 MED ORDER — NITROGLYCERIN IN D5W 200-5 MCG/ML-% IV SOLN
0.0000 ug/min | INTRAVENOUS | Status: DC
  Filled 2017-01-30: qty 250

## 2017-01-30 MED ORDER — ORAL CARE MOUTH RINSE
15.0000 mL | Freq: Four times a day (QID) | OROMUCOSAL | Status: DC
  Administered 2017-01-31 – 2017-02-01 (×6): 15 mL via OROMUCOSAL

## 2017-01-30 MED ORDER — FLUTICASONE PROPIONATE 50 MCG/ACT NA SUSP
2.0000 | Freq: Every day | NASAL | Status: DC
  Administered 2017-01-31: 2 via NASAL
  Filled 2017-01-30: qty 16

## 2017-01-30 MED ORDER — SUCCINYLCHOLINE CHLORIDE 200 MG/10ML IV SOSY
PREFILLED_SYRINGE | INTRAVENOUS | Status: AC
  Filled 2017-01-30: qty 10

## 2017-01-30 MED ORDER — METOPROLOL TARTRATE 25 MG/10 ML ORAL SUSPENSION: 12.5000 mg | Freq: Two times a day (BID) | ORAL | Status: DC

## 2017-01-30 MED ORDER — THROMBIN (RECOMBINANT) 5000 UNITS EX SOLR
CUTANEOUS | Status: DC | PRN
  Administered 2017-01-30: 10000 [IU] via TOPICAL

## 2017-01-30 MED ORDER — MIDAZOLAM HCL 10 MG/2ML IJ SOLN
INTRAMUSCULAR | Status: AC
  Filled 2017-01-30: qty 2

## 2017-01-30 MED ORDER — PLASMA-LYTE 148 IV SOLN
INTRAVENOUS | Status: AC | PRN
  Administered 2017-01-30: 500 mL via INTRAVENOUS

## 2017-01-30 MED ORDER — VANCOMYCIN HCL IN DEXTROSE 1-5 GM/200ML-% IV SOLN
1000.0000 mg | Freq: Once | INTRAVENOUS | Status: AC
  Administered 2017-01-30: 1000 mg via INTRAVENOUS
  Filled 2017-01-30: qty 200

## 2017-01-30 MED ORDER — LOSARTAN POTASSIUM 50 MG PO TABS
50.0000 mg | ORAL_TABLET | Freq: Once | ORAL | Status: DC
  Filled 2017-01-30: qty 1

## 2017-01-30 MED ORDER — ARTIFICIAL TEARS OPHTHALMIC OINT
TOPICAL_OINTMENT | OPHTHALMIC | Status: AC
  Filled 2017-01-30: qty 3.5

## 2017-01-30 MED ORDER — SODIUM CHLORIDE 0.9% FLUSH
3.0000 mL | Freq: Two times a day (BID) | INTRAVENOUS | Status: DC
  Administered 2017-01-31 – 2017-02-06 (×10): 3 mL via INTRAVENOUS

## 2017-01-30 MED ORDER — INSULIN REGULAR BOLUS VIA INFUSION
0.0000 [IU] | Freq: Three times a day (TID) | INTRAVENOUS | Status: DC
  Filled 2017-01-30: qty 10

## 2017-01-30 MED ORDER — ACETAMINOPHEN 160 MG/5ML PO SOLN
650.0000 mg | Freq: Once | ORAL | Status: AC
Start: 2017-01-30 — End: 2017-01-30

## 2017-01-30 MED ORDER — SODIUM CHLORIDE 0.9 % IV SOLN: 250.0000 mL | INTRAVENOUS | Status: DC

## 2017-01-30 MED ORDER — PROTAMINE SULFATE 10 MG/ML IV SOLN
INTRAVENOUS | Status: AC
  Filled 2017-01-30: qty 25

## 2017-01-30 MED ORDER — DOPAMINE-DEXTROSE 3.2-5 MG/ML-% IV SOLN
INTRAVENOUS | Status: AC
  Administered 2017-01-30: 3 ug/kg/min
  Filled 2017-01-30: qty 250

## 2017-01-30 SURGICAL SUPPLY — 138 items
ADAPTER CARDIO PERF ANTE/RETRO (ADAPTER) ×3 IMPLANT
BAG DECANTER FOR FLEXI CONT (MISCELLANEOUS) ×3 IMPLANT
BANDAGE ACE 4X5 VEL STRL LF (GAUZE/BANDAGES/DRESSINGS) ×3 IMPLANT
BANDAGE ACE 6X5 VEL STRL LF (GAUZE/BANDAGES/DRESSINGS) ×3 IMPLANT
BANDAGE ELASTIC 4 VELCRO ST LF (GAUZE/BANDAGES/DRESSINGS) ×3 IMPLANT
BANDAGE ELASTIC 6 VELCRO ST LF (GAUZE/BANDAGES/DRESSINGS) ×3 IMPLANT
BASKET HEART (ORDER IN 25'S) (MISCELLANEOUS) ×1
BASKET HEART (ORDER IN 25S) (MISCELLANEOUS) ×2 IMPLANT
BLADE NEEDLE 3 SS STRL (BLADE) ×3 IMPLANT
BLADE STERNUM SYSTEM 6 (BLADE) ×3 IMPLANT
BLADE SURG 11 STRL SS (BLADE) ×3 IMPLANT
BLADE SURG 15 STRL LF DISP TIS (BLADE) ×2 IMPLANT
BLADE SURG 15 STRL SS (BLADE) ×1
BNDG GAUZE ELAST 4 BULKY (GAUZE/BANDAGES/DRESSINGS) ×3 IMPLANT
CANISTER SUCT 3000ML PPV (MISCELLANEOUS) ×3 IMPLANT
CANN PRFSN 3/8X14X24FR PCFC (MISCELLANEOUS) ×2
CANNULA GUNDRY RCSP 15FR (MISCELLANEOUS) ×3 IMPLANT
CANNULA PRFSN 3/8X14X24FR PCFC (MISCELLANEOUS) ×2 IMPLANT
CANNULA VEN MTL TIP RT (MISCELLANEOUS) ×1
CANNULA VRC MALB SNGL STG 34FR (MISCELLANEOUS) ×2 IMPLANT
CATH ROBINSON RED A/P 18FR (CATHETERS) ×9 IMPLANT
CATH THORACIC 28FR (CATHETERS) ×3 IMPLANT
CATH THORACIC 36FR (CATHETERS) ×3 IMPLANT
CATH THORACIC 36FR RT ANG (CATHETERS) ×3 IMPLANT
CLIP VESOCCLUDE MED 24/CT (CLIP) IMPLANT
CLIP VESOCCLUDE SM WIDE 24/CT (CLIP) ×12 IMPLANT
CONN 1/2X1/2X1/2  BEN (MISCELLANEOUS) ×1
CONN 1/2X1/2X1/2 BEN (MISCELLANEOUS) ×2 IMPLANT
CONN 3/8X1/2 ST GISH (MISCELLANEOUS) ×6 IMPLANT
COUNTER NEEDLE 20 DBL MAG RED (NEEDLE) ×3 IMPLANT
COVER SURGICAL LIGHT HANDLE (MISCELLANEOUS) IMPLANT
CRADLE DONUT ADULT HEAD (MISCELLANEOUS) ×3 IMPLANT
DERMABOND ADVANCED (GAUZE/BANDAGES/DRESSINGS) ×1
DERMABOND ADVANCED .7 DNX12 (GAUZE/BANDAGES/DRESSINGS) ×2 IMPLANT
DRAPE CARDIOVASCULAR INCISE (DRAPES) ×1
DRAPE SLUSH/WARMER DISC (DRAPES) ×3 IMPLANT
DRAPE SRG 135X102X78XABS (DRAPES) ×2 IMPLANT
DRSG COVADERM 4X14 (GAUZE/BANDAGES/DRESSINGS) ×3 IMPLANT
ELECT CAUTERY BLADE 6.4 (BLADE) ×3 IMPLANT
ELECT REM PT RETURN 9FT ADLT (ELECTROSURGICAL) ×6
ELECTRODE REM PT RTRN 9FT ADLT (ELECTROSURGICAL) ×4 IMPLANT
FELT TEFLON 1X6 (MISCELLANEOUS) ×3 IMPLANT
GAUZE SPONGE 4X4 12PLY STRL (GAUZE/BANDAGES/DRESSINGS) ×6 IMPLANT
GAUZE SPONGE 4X4 12PLY STRL LF (GAUZE/BANDAGES/DRESSINGS) ×6 IMPLANT
GLOVE BIO SURGEON STRL SZ 6 (GLOVE) ×3 IMPLANT
GLOVE BIO SURGEON STRL SZ 6.5 (GLOVE) ×12 IMPLANT
GLOVE BIO SURGEON STRL SZ7 (GLOVE) IMPLANT
GLOVE BIO SURGEON STRL SZ7.5 (GLOVE) IMPLANT
GLOVE BIO SURGEON STRL SZ8.5 (GLOVE) ×3 IMPLANT
GLOVE BIOGEL PI IND STRL 6 (GLOVE) ×6 IMPLANT
GLOVE BIOGEL PI IND STRL 6.5 (GLOVE) ×6 IMPLANT
GLOVE BIOGEL PI IND STRL 7.0 (GLOVE) IMPLANT
GLOVE BIOGEL PI INDICATOR 6 (GLOVE) ×3
GLOVE BIOGEL PI INDICATOR 6.5 (GLOVE) ×3
GLOVE BIOGEL PI INDICATOR 7.0 (GLOVE)
GLOVE EUDERMIC 7 POWDERFREE (GLOVE) ×6 IMPLANT
GLOVE ORTHO TXT STRL SZ7.5 (GLOVE) IMPLANT
GOWN STRL REUS W/ TWL LRG LVL3 (GOWN DISPOSABLE) ×14 IMPLANT
GOWN STRL REUS W/ TWL XL LVL3 (GOWN DISPOSABLE) ×2 IMPLANT
GOWN STRL REUS W/TWL LRG LVL3 (GOWN DISPOSABLE) ×7
GOWN STRL REUS W/TWL XL LVL3 (GOWN DISPOSABLE) ×1
HEMOSTAT POWDER SURGIFOAM 1G (HEMOSTASIS) ×9 IMPLANT
HEMOSTAT SURGICEL 2X14 (HEMOSTASIS) ×3 IMPLANT
INSERT FOGARTY 61MM (MISCELLANEOUS) IMPLANT
INSERT FOGARTY XLG (MISCELLANEOUS) IMPLANT
KIT BASIN OR (CUSTOM PROCEDURE TRAY) ×3 IMPLANT
KIT CATH CPB BARTLE (MISCELLANEOUS) ×3 IMPLANT
KIT ROOM TURNOVER OR (KITS) ×3 IMPLANT
KIT SUCTION CATH 14FR (SUCTIONS) ×3 IMPLANT
KIT VASOVIEW HEMOPRO VH 3000 (KITS) ×3 IMPLANT
LINE VENT (MISCELLANEOUS) ×3 IMPLANT
LOOP VESSEL SUPERMAXI WHITE (MISCELLANEOUS) ×3 IMPLANT
NS IRRIG 1000ML POUR BTL (IV SOLUTION) ×18 IMPLANT
PACK OPEN HEART (CUSTOM PROCEDURE TRAY) ×3 IMPLANT
PAD ARMBOARD 7.5X6 YLW CONV (MISCELLANEOUS) ×6 IMPLANT
PAD ELECT DEFIB RADIOL ZOLL (MISCELLANEOUS) ×3 IMPLANT
PENCIL BUTTON HOLSTER BLD 10FT (ELECTRODE) ×3 IMPLANT
PUNCH AORTIC ROTATE 4.0MM (MISCELLANEOUS) IMPLANT
PUNCH AORTIC ROTATE 4.5MM 8IN (MISCELLANEOUS) ×3 IMPLANT
PUNCH AORTIC ROTATE 5MM 8IN (MISCELLANEOUS) IMPLANT
RING MITRAL MEMO 3D 26MM SMD26 (Prosthesis & Implant Heart) ×3 IMPLANT
SEALANT SURG COSEAL 4ML (VASCULAR PRODUCTS) ×3 IMPLANT
SET CARDIOPLEGIA MPS 5001102 (MISCELLANEOUS) ×3 IMPLANT
SPONGE INTESTINAL PEANUT (DISPOSABLE) IMPLANT
SPONGE LAP 18X18 X RAY DECT (DISPOSABLE) ×6 IMPLANT
SPONGE LAP 4X18 X RAY DECT (DISPOSABLE) ×3 IMPLANT
SUCKER WEIGHTED FLEX (MISCELLANEOUS) ×3 IMPLANT
SUT BONE WAX W31G (SUTURE) ×3 IMPLANT
SUT ETHIBOND 2 0 SH (SUTURE) IMPLANT
SUT ETHIBOND 2 0 SH 36X2 (SUTURE) IMPLANT
SUT ETHIBOND 2 0 V4 (SUTURE) ×6 IMPLANT
SUT ETHIBOND 2 0V4 GREEN (SUTURE) ×6 IMPLANT
SUT MNCRL AB 3-0 PS2 18 (SUTURE) ×3 IMPLANT
SUT MNCRL AB 4-0 PS2 18 (SUTURE) ×3 IMPLANT
SUT PROLENE 3 0 SH 48 (SUTURE) ×6 IMPLANT
SUT PROLENE 3 0 SH DA (SUTURE) IMPLANT
SUT PROLENE 3 0 SH1 36 (SUTURE) ×3 IMPLANT
SUT PROLENE 4 0 RB 1 (SUTURE) ×4
SUT PROLENE 4 0 SH DA (SUTURE) IMPLANT
SUT PROLENE 4-0 RB1 .5 CRCL 36 (SUTURE) ×8 IMPLANT
SUT PROLENE 5 0 C 1 36 (SUTURE) ×3 IMPLANT
SUT PROLENE 6 0 C 1 30 (SUTURE) ×15 IMPLANT
SUT PROLENE 7 0 BV 1 (SUTURE) IMPLANT
SUT PROLENE 7 0 BV1 MDA (SUTURE) ×9 IMPLANT
SUT PROLENE 8 0 BV175 6 (SUTURE) ×6 IMPLANT
SUT SILK  1 MH (SUTURE) ×4
SUT SILK 1 MH (SUTURE) ×8 IMPLANT
SUT SILK 1 TIES 10X30 (SUTURE) ×3 IMPLANT
SUT SILK 2 0 SH CR/8 (SUTURE) ×6 IMPLANT
SUT SILK 2 0 TIES 10X30 (SUTURE) ×3 IMPLANT
SUT SILK 2 0 TIES 17X18 (SUTURE) ×1
SUT SILK 2-0 18XBRD TIE BLK (SUTURE) ×2 IMPLANT
SUT SILK 4 0 TIE 10X30 (SUTURE) ×6 IMPLANT
SUT STEEL STERNAL CCS#1 18IN (SUTURE) IMPLANT
SUT STEEL SZ 6 DBL 3X14 BALL (SUTURE) ×9 IMPLANT
SUT TEM PAC WIRE 2 0 SH (SUTURE) ×12 IMPLANT
SUT VIC AB 1 CTX 36 (SUTURE) ×2
SUT VIC AB 1 CTX36XBRD ANBCTR (SUTURE) ×4 IMPLANT
SUT VIC AB 2-0 CT1 27 (SUTURE) ×1
SUT VIC AB 2-0 CT1 TAPERPNT 27 (SUTURE) ×2 IMPLANT
SUT VIC AB 2-0 CTX 27 (SUTURE) ×6 IMPLANT
SUT VIC AB 3-0 SH 27 (SUTURE)
SUT VIC AB 3-0 SH 27X BRD (SUTURE) IMPLANT
SUT VIC AB 3-0 X1 27 (SUTURE) ×6 IMPLANT
SUT VICRYL 4-0 PS2 18IN ABS (SUTURE) ×3 IMPLANT
SUTURE E-PAK OPEN HEART (SUTURE) IMPLANT
SYSTEM SAHARA CHEST DRAIN ATS (WOUND CARE) ×3 IMPLANT
TAPE CLOTH SURG 4X10 WHT LF (GAUZE/BANDAGES/DRESSINGS) ×6 IMPLANT
TAPE PAPER 2X10 WHT MICROPORE (GAUZE/BANDAGES/DRESSINGS) ×3 IMPLANT
TOWEL GREEN STERILE (TOWEL DISPOSABLE) ×6 IMPLANT
TOWEL GREEN STERILE FF (TOWEL DISPOSABLE) ×6 IMPLANT
TOWEL OR 17X24 6PK STRL BLUE (TOWEL DISPOSABLE) IMPLANT
TOWEL OR 17X26 10 PK STRL BLUE (TOWEL DISPOSABLE) IMPLANT
TRAY FOLEY SILVER 16FR TEMP (SET/KITS/TRAYS/PACK) ×3 IMPLANT
TUBING INSUFFLATION (TUBING) ×3 IMPLANT
UNDERPAD 30X30 (UNDERPADS AND DIAPERS) ×3 IMPLANT
VRC MALLEABLE SINGLE STG 34FR (MISCELLANEOUS) ×3
WATER STERILE IRR 1000ML POUR (IV SOLUTION) ×6 IMPLANT

## 2017-01-30 NOTE — Progress Notes (Signed)
      [address]       [place] [address]             [phone]      Intubated, sedated  BP (!) 146/90 (BP Location: Right Arm)   Pulse 80   Temp (!) 95.4 F (35.2 C)   Resp 13   Ht 5\' 7"  (1.702 m)   Wt 247 lb 9.6 oz (112.3 kg)   SpO2 94%   BMI 38.78 kg/m    Intake/Output Summary (Last 24 hours) at 01/30/2017 1809 Last data filed at 01/30/2017 1530 Gross per 24 hour  Intake 3241.73 ml  Output 3050 ml  Net 191.73 ml   PA 17/10 Ci= 0.9!- swan coiled on CXR. I refloated the swan and have a good pressure tracing but CI still low Will check co-ox.  Otherwise looks good early postop  Remo Lipps C. Roxan Hockey, MD [facility] [phone]

## 2017-01-30 NOTE — Progress Notes (Signed)
Patient ID: [name], female   DOB: April 26, 1955, 61 y.o.   MRN: [id number] Cardiothoracic Surgery  Patient is stable for CABG and possible mitral valve repair this am. She has no further questions. I have discussed the operative procedure with the patient including alternatives, benefits and risks; including but not limited to bleeding, blood transfusion, infection, stroke, myocardial infarction, graft failure, heart block requiring a permanent pacemaker, organ dysfunction, and death.  [name] understands and agrees to proceed.

## 2017-01-30 NOTE — Brief Op Note (Signed)
01/23/2017 - 01/30/2017  1:22 PM  PATIENT:  [name]  61 y.o. female  PRE-OPERATIVE DIAGNOSIS:  1. MULTIVESSEL CAD 2. SEVERE MITRAL REGURGITATION  POST-OPERATIVE DIAGNOSIS:  1. MULTIVESSEL CAD 2. SEVERE MITRAL REGURGITATION   PROCEDURE:  TRANSESOPHAGEAL ECHOCARDIOGRAM (TEE), MEDIAN STERNOTOMY for CORONARY ARTERY BYPASS GRAFTING (CABG)x 4 (LIMA to LAD, SVG SEQUENTIALLY to OM1 and OM2, and SVG to PDA) using the right saphaneous vein harvested endoscopicallyand left internal mammary artery,  MITRAL VALVE REPAIR (MVR) (using a [employer]rist, Model # [id number], Serial # [id number]),size 26)  SURGEON:  Surgeon(s) and Role:    * Bartle, Fernande Boyden, MD - Primary  PHYSICIAN ASSISTANT: Lars Pinks PA-C  ASSISTANTS: Ara Kussmaul RNFA  ANESTHESIA:   general  DRAINS: Chest tubes placed in the mediastinal and pleural spaces   COUNTS CORRECT:  YES  DICTATION: .Dragon Dictation  PLAN OF CARE: Admit to inpatient   PATIENT DISPOSITION:  ICU - intubated and hemodynamically stable.   Delay start of Pharmacological VTE agent (>24hrs) due to surgical blood loss or risk of bleeding: yes  BASELINE WEIGHT: 112.3 kg

## 2017-01-30 NOTE — Progress Notes (Signed)
  Echocardiogram Echocardiogram Transesophageal has been performed.  [name] 01/30/2017, 9:26 AM

## 2017-01-30 NOTE — Transfer of Care (Signed)
Immediate Anesthesia Transfer of Care Note  Patient: AMANTHA SKLAR  Procedure(s) Performed: CORONARY ARTERY BYPASS GRAFTING (CABG), Times four  using the right saphaneous vein, harvested endoscopicly.  and left internal mammary artery . (N/A Chest) TRANSESOPHAGEAL ECHOCARDIOGRAM (TEE) (N/A ) MITRAL VALVE REPAIR (MVR) (N/A Chest)  Patient Location: SICU  Anesthesia Type:General  Level of Consciousness: Patient remains intubated per anesthesia plan  Airway & Oxygen Therapy: Patient remains intubated per anesthesia plan and Patient placed on Ventilator (see vital sign flow sheet for setting)  Post-op Assessment: Report given to RN and Post -op Vital signs reviewed and stable  Post vital signs: Reviewed and stable  Last Vitals:  Vitals:   01/30/17 0651 01/30/17 1520  BP:    Pulse: (!) 52   Resp: 14   Temp:    SpO2: 98% 92%    Last Pain:  Vitals:   01/30/17 0350  TempSrc: Oral  PainSc:       Patients Stated Pain Goal: 0 (09/81/19 1478)  Complications: No apparent anesthesia complications

## 2017-01-30 NOTE — Anesthesia Procedure Notes (Signed)
Arterial Line Insertion Performed by: Lavell Luster, CRNA, CRNA  Preanesthetic checklist: patient identified, IV checked, site marked, risks and benefits discussed, surgical consent, monitors and equipment checked, pre-op evaluation and timeout performed Lidocaine 1% used for infiltration Left, radial was placed Catheter size: 20 G Hand hygiene performed , maximum sterile barriers used  and Seldinger technique used Allen's test indicative of satisfactory collateral circulation Attempts: 1 Procedure performed without using ultrasound guided technique. Following insertion, dressing applied and Biopatch. Post procedure assessment: normal  Patient tolerated the procedure well with no immediate complications.

## 2017-01-30 NOTE — Anesthesia Procedure Notes (Signed)
Procedure Name: Intubation Date/Time: 01/30/2017 7:58 AM Performed by: Izora Gala, CRNA Pre-anesthesia Checklist: Patient identified, Emergency Drugs available, Suction available and Patient being monitored Patient Re-evaluated:Patient Re-evaluated prior to induction Oxygen Delivery Method: Circle system utilized Preoxygenation: Pre-oxygenation with 100% oxygen Induction Type: IV induction Ventilation: Mask ventilation without difficulty and Oral airway inserted - appropriate to patient size Laryngoscope Size: Miller and 3 Grade View: Grade I Tube type: Oral Tube size: 7.5 mm Number of attempts: 1 Airway Equipment and Method: Stylet Placement Confirmation: ETT inserted through vocal cords under direct vision,  positive ETCO2 and breath sounds checked- equal and bilateral Secured at: 22 cm Tube secured with: Tape Dental Injury: Teeth and Oropharynx as per pre-operative assessment

## 2017-01-30 NOTE — Anesthesia Preprocedure Evaluation (Signed)
Anesthesia Evaluation  Patient identified by MRN, date of birth, ID band Patient awake    Reviewed: Allergy & Precautions, NPO status , Patient's Chart, lab work & pertinent test results, reviewed documented beta blocker date and time   Airway Mallampati: II  TM Distance: >3 FB Neck ROM: Full    Dental no notable dental hx.    Pulmonary neg pulmonary ROS, Current Smoker,    Pulmonary exam normal        Cardiovascular + angina at rest + CAD, + Past MI, + Cardiac Stents and + Peripheral Vascular Disease   Rhythm:Regular Rate:Bradycardia     Neuro/Psych    GI/Hepatic negative GI ROS, Neg liver ROS,   Endo/Other    Renal/GU negative Renal ROS     Musculoskeletal   Abdominal (+) + obese,  Abdomen: soft.    Peds  Hematology   Anesthesia Other Findings   Reproductive/Obstetrics                             Anesthesia Physical Anesthesia Plan  ASA: IV  Anesthesia Plan: General   Post-op Pain Management:    Induction: Intravenous  PONV Risk Score and Plan:   Airway Management Planned: Oral ETT  Additional Equipment: Arterial line, CVP, PA Cath, 3D TEE and Ultrasound Guidance Line Placement  Intra-op Plan: Utilization of Controlled Hypotension per surrgeon request, Utilization Of Total Body Hypothermia per surgeon request and Delibrate Circulatory arrest per surgeon request  Post-operative Plan: Post-operative intubation/ventilation  Informed Consent: I have reviewed the patients History and Physical, chart, labs and discussed the procedure including the risks, benefits and alternatives for the proposed anesthesia with the patient or authorized representative who has indicated his/her understanding and acceptance.   Dental advisory given  Plan Discussed with: CRNA, Anesthesiologist and Surgeon  Anesthesia Plan Comments:         Anesthesia Quick Evaluation

## 2017-01-30 NOTE — Procedures (Signed)
Extubation Procedure Note  Patient Details:   Name: SOKHA CRAKER DOB: 13-Sep-1955 MRN: [id number]   Airway Documentation:     Evaluation  O2 sats: transiently fell during during procedure and currently acceptable Complications: No apparent complications Patient did tolerate procedure well. Bilateral Breath Sounds: Clear, Diminished   Yes   Patient was extubated after RN spoke with MD about lab results. Patient was able to perform NIF -28 and VC 781mL and had a positive cuff leak.  .  Patient IS effort was minimal.    Carrington Clamp A 01/30/2017, 10:22 PM

## 2017-01-30 NOTE — Op Note (Signed)
CARDIOVASCULAR SURGERY OPERATIVE NOTE  01/30/2017  Surgeon:  Gaye Pollack, MD  First Assistant: Lars Pinks, PA-C   Preoperative Diagnosis:  Severe multi-vessel coronary artery disease and moderate to severe mitral regurgitation   Postoperative Diagnosis:  Same   Procedure:  1. Median Sternotomy 2. Extracorporeal circulation 3.   Coronary artery bypass grafting x 4   Left internal mammary graft to the LAD  Sequential SVG to OM1 and OM2  SVG to PDA  4.   Endoscopic vein harvest from the right leg 5.   Mitral valve annuloplasty using a 26 mm Sorin 3D Memo Ring   Anesthesia:  General Endotracheal   Clinical History/Surgical Indication:  The patient is a 61 year old woman with Lupus, hypothyroidism, ITP s/p splenectomy and CAD s/p stenting of the mid LCX and mid RCA in 2003, ischemic cardiomyopathy with EF of 45-50% by echo in 08/2014, moderate MR and tobacco abuse. She presents with a several week history of pain in jaw, neck, across shoulders and back and down both arms with exertion but also at rest. She has been fatigued and has had SOB with exertion. Cath shows severe 3-vessel CAD with chronic total occlusion of the RCA due to in-stent restenosis, 85% proximal LAD stenosis, occlusion of the large D1, 50% mid LAD and 90% distal LAD. The LCX has 70% stenosis in the proximal portion of the stent at the takeoff of the OM which has 85% stenosis at its ostium. There are faint collaterals to the distal RCA. LV gram was poor but EF estimated at 45-50%. No right heart cath done. LVEDP is 23 mm Hg. She underwent a TEE on 01/27/2017 showing moderate MR with malcoaptation of the mitral valve leaflets with no prolapse.   She has severe 3-vessel coronary artery disease with mild left ventricular dysfunction and moderate to severe MR.  She now presents with unstable anginal symptoms. She has  diffusely diseased and relatively small vessels but I think CABG and mitral valve repair is the best treatment for her.   I discussed the operative procedure with the patient  including alternatives, benefits and risks; including but not limited to bleeding, blood transfusion, infection, stroke, myocardial infarction, graft failure, heart block requiring a permanent pacemaker, organ dysfunction, and death.  [name] understands and agrees to proceed.    Preparation:  The patient was seen in the preoperative holding area and the correct patient, correct operation were confirmed with the patient after reviewing the medical record and catheterization. The consent was signed by me. Preoperative antibiotics were given. A pulmonary arterial line and radial arterial line were placed by the anesthesia team. The patient was taken back to the operating room and positioned supine on the operating room table. After being placed under general endotracheal anesthesia by the anesthesia team a foley catheter was placed. The neck, chest, abdomen, and both legs were prepped with betadine soap and solution and draped in the usual sterile manner. A surgical time-out was taken and the correct patient and operative procedure were confirmed with the nursing and anesthesia staff.  TEE: performed by Dr. Laurie Panda. This showed moderate to severe MR. LVEF was normal.  Cardiopulmonary Bypass:  A median sternotomy was performed. The pericardium was opened in the midline. She had dense adhesions that obliterated her pericardial cavity from apparent remote pericarditis. These adhesions were divided sharply partially prior to cannulation and then completed on bypass. Right ventricular function appeared normal. The ascending aorta was of normal size and had  no palpable plaque. There were no contraindications to aortic cannulation or cross-clamping. The patient was fully systemically heparinized and the ACT was maintained > 400  sec. The proximal aortic arch was cannulated with a 20 F aortic cannula for arterial inflow. Venous cannulation was performed via the right atrial appendage using a two-staged venous cannula. An antegrade cardioplegia/vent cannula was inserted into the mid-ascending aorta. Aortic occlusion was performed with a single cross-clamp. Systemic cooling to 32 degrees Centigrade and topical cooling of the heart with iced saline were used. Hyperkalemic antegrade cold blood cardioplegia was used to induce diastolic arrest and was then given at about 20 minute intervals throughout the period of arrest to maintain myocardial temperature at or below 10 degrees centigrade. A temperature probe was inserted into the interventricular septum and an insulating pad was placed in the pericardium.   Left internal mammary harvest:  The left side of the sternum was retracted using the Rultract retractor. The left internal mammary artery was harvested as a pedicle graft. All side branches were clipped. It was a medium-sized vessel of good quality with excellent blood flow. It was ligated distally and divided. It was sprayed with topical papaverine solution to prevent vasospasm.   Endoscopic vein harvest:  The right greater saphenous vein was harvested endoscopically through a 2 cm incision medial to the right knee. It was harvested from the upper thigh to below the knee. It was a medium-sized vein of good quality. The side branches were all ligated with 4-0 silk ties.    Coronary arteries:  The coronary arteries were examined.   LAD:  Intramyocardial in its proximal and mid portion. The distal third of the vessel was visible and diffusely diseased but graftable.  LCX:  OM1 was a large vessel with mild distal disease. The OM2 was a moderate sized vessel before it bifurcated and was diffusely diseased but graftable.  RCA:  Diffusely diseased with calcific plaque. The PDA was diffusely diseased but graftable and of small  to medium size.   Grafts:  1. LIMA to the LAD: 1.75 mm. It was sewn end to side using 8-0 prolene continuous suture. 2. SVG to PDA:  1.5 mm. It was sewn end to side using 7-0 prolene continuous suture. 3. Sequential SVG to OM1:  1.75 mm. It was sewn sequential side to side using 7-0 prolene continuous suture. 4. Sequential SVG to OM2:  1.6 mm. It was sewn sequential end to side using 7-0 prolene continuous suture.  The proximal vein graft anastomoses were performed to the mid-ascending aorta using continuous 6-0 prolene suture. Graft markers were placed around the proximal anastomoses.   Mitral Valve Repair:  The left atrium was opened through a vertical incision in the interatrial groove. Exposure was good. Valve inspection showed mildly thickened leaflets with normal subvalvular apparatus. There was no prolapse and the regurgitation appeared to be related to a loss of coaptation. The annulus did not appear enlarged. I think it was probably due to some tethering and restriction of the posterior leaflet.   A series of pledgetted 2-0 Ethibond sutures were placed around the mitral annulus. The anterior leaflet was sized and a 26 mm Sorin 3D Memo annuloplasty ring (REF [id number], SN [id number]) was chosen. The sutures were placed through the ring and it was lowered into place. The sutures were tied. The valve was tested with saline with complete distension of the LV and there was no regurgitation. The atrium was closed with 2 layers of continuous 3-0 prolene suture.  Completion:  The patient was rewarmed to 37 degrees Centigrade. The clamp was removed from the LIMA pedicle and there was rapid warming of the septum and return of ventricular fibrillation. The crossclamp was removed with a time of 172 minutes. There was spontaneous return of sinus rhythm. The distal and proximal anastomoses were checked for hemostasis. The position of the grafts was satisfactory. Two temporary epicardial pacing wires were  placed on the right atrium and two on the right ventricle. The patient was weaned from CPB without difficulty on no inotropes. CPB time was 199 minutes. Cardiac output was 5 LPM. TEE showed a very focal whiff of MR in one spot. The mean gradient across the valve was 3 mm Hg. LV function appeared preserved.  Heparin was fully reversed with protamine and the aortic and venous cannulas removed. Hemostasis was achieved. The patient was given two units of platelets for a platelet count of 70K. Mediastinal and left pleural drainage tubes were placed. The sternum was closed with double #6 stainless steel wires. The fascia was closed with continuous # 1 vicryl suture. The subcutaneous tissue was closed with 2-0 vicryl continuous suture. The skin was closed with 3-0 vicryl subcuticular suture. All sponge, needle, and instrument counts were reported correct at the end of the case. Dry sterile dressings were placed over the incisions and around the chest tubes which were connected to pleurevac suction. The patient was then transported to the surgical intensive care unit in critical but stable condition.

## 2017-01-31 ENCOUNTER — Inpatient Hospital Stay (HOSPITAL_COMMUNITY): Payer: BLUE CROSS/BLUE SHIELD

## 2017-01-31 LAB — MAGNESIUM
Magnesium: 2.6 mg/dL — ABNORMAL HIGH (ref 1.7–2.4)
Magnesium: 2.5 mg/dL — ABNORMAL HIGH (ref 1.7–2.4)

## 2017-01-31 LAB — PREPARE PLATELET PHERESIS
Unit division: 0
Unit division: 0

## 2017-01-31 LAB — BASIC METABOLIC PANEL
Anion gap: 5 (ref 5–15)
BUN: 10 mg/dL (ref 6–20)
CO2: 24 mmol/L (ref 22–32)
Calcium: 8 mg/dL — ABNORMAL LOW (ref 8.9–10.3)
Chloride: 110 mmol/L (ref 101–111)
Creatinine, Ser: 0.9 mg/dL (ref 0.44–1.00)
GFR calc Af Amer: >60 mL/min (ref >60)
GFR calc non Af Amer: >60 mL/min (ref >60)
Glucose, Bld: 103 mg/dL — ABNORMAL HIGH (ref 65–99)
Potassium: 4.5 mmol/L (ref 3.5–5.1)
Sodium: 139 mmol/L (ref 135–145)

## 2017-01-31 LAB — CBC
HCT: 27.5 % — ABNORMAL LOW (ref 36.0–46.0)
HCT: 28.4 % — ABNORMAL LOW (ref 36.0–46.0)
Hemoglobin: 9.5 g/dL — ABNORMAL LOW (ref 12.0–15.0)
Hemoglobin: 9.7 g/dL — ABNORMAL LOW (ref 12.0–15.0)
MCH: 34.2 pg — ABNORMAL HIGH (ref 26.0–34.0)
MCH: 34.2 pg — ABNORMAL HIGH (ref 26.0–34.0)
MCHC: 34.5 g/dL (ref 30.0–36.0)
MCHC: 34.2 g/dL (ref 30.0–36.0)
MCV: 98.9 fL (ref 78.0–100.0)
MCV: 100 fL (ref 78.0–100.0)
Platelets: 160 10*3/uL (ref 150–400)
Platelets: 152 10*3/uL (ref 150–400)
RBC: 2.78 MIL/uL — ABNORMAL LOW (ref 3.87–5.11)
RBC: 2.84 MIL/uL — ABNORMAL LOW (ref 3.87–5.11)
RDW: 14.9 % (ref 11.5–15.5)
RDW: 15.2 % (ref 11.5–15.5)
WBC: 9.7 10*3/uL (ref 4.0–10.5)
WBC: 10 10*3/uL (ref 4.0–10.5)

## 2017-01-31 LAB — BPAM PLATELET PHERESIS
Blood Product Expiration Date: 201811112359
Blood Product Expiration Date: 201811112359
ISSUE DATE / TIME: 201811091329
ISSUE DATE / TIME: 201811091329
Unit Type and Rh: 5100
Unit Type and Rh: 5100

## 2017-01-31 LAB — GLUCOSE, CAPILLARY
Glucose-Capillary: 104 mg/dL — ABNORMAL HIGH (ref 65–99)
Glucose-Capillary: 106 mg/dL — ABNORMAL HIGH (ref 65–99)
Glucose-Capillary: 107 mg/dL — ABNORMAL HIGH (ref 65–99)
Glucose-Capillary: 108 mg/dL — ABNORMAL HIGH (ref 65–99)
Glucose-Capillary: 109 mg/dL — ABNORMAL HIGH (ref 65–99)
Glucose-Capillary: 111 mg/dL — ABNORMAL HIGH (ref 65–99)
Glucose-Capillary: 118 mg/dL — ABNORMAL HIGH (ref 65–99)
Glucose-Capillary: 137 mg/dL — ABNORMAL HIGH (ref 65–99)
Glucose-Capillary: 91 mg/dL (ref 65–99)

## 2017-01-31 LAB — POCT I-STAT, CHEM 8
BUN: 14 mg/dL (ref 6–20)
Calcium, Ion: 1.17 mmol/L (ref 1.15–1.40)
Chloride: 106 mmol/L (ref 101–111)
Creatinine, Ser: 0.9 mg/dL (ref 0.44–1.00)
Glucose, Bld: 106 mg/dL — ABNORMAL HIGH (ref 65–99)
HCT: 29 % — ABNORMAL LOW (ref 36.0–46.0)
Hemoglobin: 9.9 g/dL — ABNORMAL LOW (ref 12.0–15.0)
Potassium: 4.2 mmol/L (ref 3.5–5.1)
Sodium: 138 mmol/L (ref 135–145)
TCO2: 23 mmol/L (ref 22–32)

## 2017-01-31 LAB — CREATININE, SERUM
Creatinine, Ser: 1.03 mg/dL — ABNORMAL HIGH (ref 0.44–1.00)
GFR calc Af Amer: >60 mL/min (ref >60)
GFR calc non Af Amer: 57 mL/min — ABNORMAL LOW (ref >60)

## 2017-01-31 LAB — HEMOGLOBIN A1C
Hgb A1c MFr Bld: 5.6 % (ref 4.8–5.6)
Mean Plasma Glucose: 114.02 mg/dL

## 2017-01-31 MED ORDER — INSULIN ASPART 100 UNIT/ML ~~LOC~~ SOLN: 0.0000 [IU] | SUBCUTANEOUS | Status: DC

## 2017-01-31 MED ORDER — FUROSEMIDE 10 MG/ML IJ SOLN
40.0000 mg | Freq: Once | INTRAMUSCULAR | Status: AC
  Administered 2017-01-31: 40 mg via INTRAVENOUS

## 2017-01-31 MED ORDER — ENOXAPARIN SODIUM 40 MG/0.4ML ~~LOC~~ SOLN
40.0000 mg | Freq: Every day | SUBCUTANEOUS | Status: DC
  Administered 2017-01-31 – 2017-02-05 (×6): 40 mg via SUBCUTANEOUS
  Filled 2017-01-31 (×6): qty 0.4

## 2017-01-31 NOTE — Progress Notes (Signed)
1 Day Post-Op Procedure(s) (LRB): CORONARY ARTERY BYPASS GRAFTING (CABG), Times four  using the right saphaneous vein, harvested endoscopicly.  and left internal mammary artery . (N/A) TRANSESOPHAGEAL ECHOCARDIOGRAM (TEE) (N/A) MITRAL VALVE REPAIR (MVR) (N/A) Subjective: Nausea earlier this morning, OK now  Objective: Vital signs in last 24 hours: Temp:  [94.5 F (34.7 C)-98.6 F (37 C)] 98.4 F (36.9 C) (11/10 0900) Pulse Rate:  [79-90] 90 (11/10 0900) Cardiac Rhythm: Atrial paced (11/10 0800) Resp:  [0-30] 23 (11/10 0900) BP: (89)/(59) 89/59 (11/10 0832) SpO2:  [88 %-98 %] 97 % (11/10 0900) Arterial Line BP: (97-168)/(61-95) 136/74 (11/10 0900) FiO2 (%):  [40 %-80 %] 55 % (11/10 0800) Weight:  [259 lb 0.7 oz (117.5 kg)] 259 lb 0.7 oz (117.5 kg) (11/10 0430)  Hemodynamic parameters for last 24 hours: PAP: (16-54)/(7-32) 39/19 CO:  [2 L/min-4.3 L/min] 4.3 L/min CI:  [0.9 L/min/m2-1.9 L/min/m2] 1.9 L/min/m2  Intake/Output from previous day: 11/09 0701 - 11/10 0700 In: 5000.1 [I.V.:2865.1; Blood:535; IV Piggyback:1600] Out: 4940 [Urine:3850; Blood:900; Chest Tube:190] Intake/Output this shift: Total I/O In: 117.8 [I.V.:67.8; IV Piggyback:50] Out: 100 [Urine:100]  General appearance: alert, cooperative and no distress Neurologic: intact Heart: regular rate and rhythm Lungs: diminished breath sounds bibasilar Abdomen: normal findings: soft, non-tender  Lab Results: Recent Labs    01/30/17 2031 01/30/17 2048 01/31/17 0415  WBC 8.5  --  9.7  HGB 9.8* 9.2* 9.5*  HCT 28.6* 27.0* 27.5*  PLT 168  --  160   BMET:  Recent Labs    01/30/17 0525  01/30/17 2048 01/31/17 0415  NA 140   < > 141 139  K 3.7   < > 4.3 4.5  CL 107   < > 109 110  CO2 25  --   --  24  GLUCOSE 91   < > 145* 103*  BUN 11   < > 10 10  CREATININE 1.03*   < > 0.90 0.90  CALCIUM 9.1  --   --  8.0*   < > = values in this interval not displayed.    PT/INR:  Recent Labs    01/30/17 1541   LABPROT 18.0*  INR 1.50   ABG    Component Value Date/Time   PHART 7.371 01/30/2017 2321   HCO3 22.0 01/30/2017 2321   TCO2 23 01/30/2017 2321   ACIDBASEDEF 3.0 (H) 01/30/2017 2321   O2SAT 98.0 01/30/2017 2321   CBG (last 3)  Recent Labs    01/31/17 0129 01/31/17 0301 01/31/17 0425  GLUCAP 109* 107* 108*    Assessment/Plan: S/P Procedure(s) (LRB): CORONARY ARTERY BYPASS GRAFTING (CABG), Times four  using the right saphaneous vein, harvested endoscopicly.  and left internal mammary artery . (N/A) TRANSESOPHAGEAL ECHOCARDIOGRAM (TEE) (N/A) MITRAL VALVE REPAIR (MVR) (N/A) -CV- s/p CABG mitral repair  Low output initially, improved overnight  Dc swan and A line  Continue dopamine for now  RESP- IS for atelectasis  RENAL- creatinine and lytes, will diurese  ENDO- CBG well controlled, change to AC/HS  Anemia- secondary to ABL, follow  DC chest tubes  Mobilize    LOS: 8 days    [name] 01/31/2017

## 2017-01-31 NOTE — Progress Notes (Signed)
      [address]       [place] [address]             [phone]      POD # 1  Emesis earlier  BP 116/64   Pulse 80   Temp (!) 97.5 F (36.4 C) (Oral)   Resp 17   Ht 5\' 7"  (1.702 m)   Wt 259 lb 0.7 oz (117.5 kg)   SpO2 97%   BMI 40.57 kg/m    Intake/Output Summary (Last 24 hours) at 01/31/2017 1751 Last data filed at 01/31/2017 1700 Gross per 24 hour  Intake 2623.06 ml  Output 2850 ml  Net -226.94 ml   K= 4.2, creatinine 0.9  Still on a venti mask  Will give another dose of lasix tonight  [name] C. Roxan Hockey, MD [facility] [phone]

## 2017-02-01 ENCOUNTER — Inpatient Hospital Stay (HOSPITAL_COMMUNITY): Payer: BLUE CROSS/BLUE SHIELD

## 2017-02-01 LAB — CBC
HCT: 28.9 % — ABNORMAL LOW (ref 36.0–46.0)
Hemoglobin: 9.7 g/dL — ABNORMAL LOW (ref 12.0–15.0)
MCH: 33.7 pg (ref 26.0–34.0)
MCHC: 33.6 g/dL (ref 30.0–36.0)
MCV: 100.3 fL — ABNORMAL HIGH (ref 78.0–100.0)
Platelets: 143 10*3/uL — ABNORMAL LOW (ref 150–400)
RBC: 2.88 MIL/uL — ABNORMAL LOW (ref 3.87–5.11)
RDW: 15.5 % (ref 11.5–15.5)
WBC: 11.4 10*3/uL — ABNORMAL HIGH (ref 4.0–10.5)

## 2017-02-01 LAB — BASIC METABOLIC PANEL
Anion gap: 6 (ref 5–15)
BUN: 17 mg/dL (ref 6–20)
CO2: 26 mmol/L (ref 22–32)
Calcium: 8.2 mg/dL — ABNORMAL LOW (ref 8.9–10.3)
Chloride: 106 mmol/L (ref 101–111)
Creatinine, Ser: 1.17 mg/dL — ABNORMAL HIGH (ref 0.44–1.00)
GFR calc Af Amer: 57 mL/min — ABNORMAL LOW (ref >60)
GFR calc non Af Amer: 49 mL/min — ABNORMAL LOW (ref >60)
Glucose, Bld: 112 mg/dL — ABNORMAL HIGH (ref 65–99)
Potassium: 4.3 mmol/L (ref 3.5–5.1)
Sodium: 138 mmol/L (ref 135–145)

## 2017-02-01 LAB — GLUCOSE, CAPILLARY
Glucose-Capillary: 103 mg/dL — ABNORMAL HIGH (ref 65–99)
Glucose-Capillary: 84 mg/dL (ref 65–99)
Glucose-Capillary: 105 mg/dL — ABNORMAL HIGH (ref 65–99)

## 2017-02-01 MED ORDER — WARFARIN - PHYSICIAN DOSING INPATIENT
Freq: Every day | Status: DC
  Administered 2017-02-01 – 2017-02-09 (×6)

## 2017-02-01 MED ORDER — METOCLOPRAMIDE HCL 5 MG/ML IJ SOLN
10.0000 mg | Freq: Four times a day (QID) | INTRAMUSCULAR | Status: AC
  Administered 2017-02-01 – 2017-02-02 (×4): 10 mg via INTRAVENOUS
  Filled 2017-02-01 (×4): qty 2

## 2017-02-01 MED ORDER — FUROSEMIDE 10 MG/ML IJ SOLN
40.0000 mg | Freq: Once | INTRAMUSCULAR | Status: AC
  Administered 2017-02-01: 40 mg via INTRAVENOUS
  Filled 2017-02-01: qty 4

## 2017-02-01 MED ORDER — ORAL CARE MOUTH RINSE
15.0000 mL | Freq: Two times a day (BID) | OROMUCOSAL | Status: DC
  Administered 2017-02-01 – 2017-02-08 (×9): 15 mL via OROMUCOSAL

## 2017-02-01 MED ORDER — INSULIN ASPART 100 UNIT/ML ~~LOC~~ SOLN: 0.0000 [IU] | Freq: Three times a day (TID) | SUBCUTANEOUS | Status: DC

## 2017-02-01 MED ORDER — PROMETHAZINE HCL 25 MG/ML IJ SOLN
12.5000 mg | Freq: Three times a day (TID) | INTRAMUSCULAR | Status: DC | PRN
  Administered 2017-02-02: 12.5 mg via INTRAVENOUS
  Filled 2017-02-01: qty 1

## 2017-02-01 MED ORDER — WARFARIN SODIUM 2.5 MG PO TABS
2.5000 mg | ORAL_TABLET | Freq: Every day | ORAL | Status: DC
  Administered 2017-02-01: 2.5 mg via ORAL
  Filled 2017-02-01: qty 1

## 2017-02-01 NOTE — Progress Notes (Signed)
      [address]       [place] [address]             [phone]      Nausea better but not yet completely resolved  BP 111/78   Pulse 91   Temp 98.6 F (37 C) (Oral)   Resp (!) 22   Ht 5\' 7"  (1.702 m)   Wt 253 lb 8.5 oz (115 kg)   SpO2 100%   BMI 39.71 kg/m    Intake/Output Summary (Last 24 hours) at 02/01/2017 1756 Last data filed at 02/01/2017 1700 Gross per 24 hour  Intake 870 ml  Output 1070 ml  Net -200 ml   Ambulated around unit this evening Will start coumadin  Remo Lipps C. Roxan Hockey, MD [facility] [phone]

## 2017-02-01 NOTE — Progress Notes (Signed)
2 Days Post-Op Procedure(s) (LRB): CORONARY ARTERY BYPASS GRAFTING (CABG), Times four  using the right saphaneous vein, harvested endoscopicly.  and left internal mammary artery . (N/A) TRANSESOPHAGEAL ECHOCARDIOGRAM (TEE) (N/A) MITRAL VALVE REPAIR (MVR) (N/A) Subjective: Still c/o nausea  Objective: Vital signs in last 24 hours: Temp:  [97.5 F (36.4 C)-100.1 F (37.8 C)] 97.7 F (36.5 C) (11/11 0818) Pulse Rate:  [80-103] 90 (11/11 0818) Cardiac Rhythm: Atrial paced (11/11 0800) Resp:  [7-38] 24 (11/11 0900) BP: (77-116)/(43-85) 101/79 (11/11 0900) SpO2:  [86 %-100 %] 100 % (11/11 0818) Arterial Line BP: (98-134)/(52-77) 98/52 (11/10 1255) FiO2 (%):  [45 %-75 %] 45 % (11/11 0130) Weight:  [253 lb 8.5 oz (115 kg)] 253 lb 8.5 oz (115 kg) (11/11 0600)  Hemodynamic parameters for last 24 hours: PAP: (34-43)/(18-24) 34/18  Intake/Output from previous day: 11/10 0701 - 11/11 0700 In: 1036.4 [I.V.:936.4; IV Piggyback:100] Out: 1895 [Urine:1895] Intake/Output this shift: Total I/O In: 111 [I.V.:61; IV Piggyback:50] Out: 100 [Urine:100]  General appearance: alert, cooperative and mild distress Neurologic: intact Heart: regular rate and rhythm Lungs: diminished breath sounds bibasilar Abdomen: soft nontender  Lab Results: Recent Labs    01/31/17 1617 01/31/17 1632 02/01/17 0302  WBC 10.0  --  11.4*  HGB 9.7* 9.9* 9.7*  HCT 28.4* 29.0* 28.9*  PLT 152  --  143*   BMET:  Recent Labs    01/31/17 0415  01/31/17 1632 02/01/17 0302  NA 139  --  138 138  K 4.5  --  4.2 4.3  CL 110  --  106 106  CO2 24  --   --  26  GLUCOSE 103*  --  106* 112*  BUN 10  --  14 17  CREATININE 0.90   < > 0.90 1.17*  CALCIUM 8.0*  --   --  8.2*   < > = values in this interval not displayed.    PT/INR:  Recent Labs    01/30/17 1541  LABPROT 18.0*  INR 1.50   ABG    Component Value Date/Time   PHART 7.371 01/30/2017 2321   HCO3 22.0 01/30/2017 2321   TCO2 23 01/31/2017 1632   ACIDBASEDEF 3.0 (H) 01/30/2017 2321   O2SAT 98.0 01/30/2017 2321   CBG (last 3)  Recent Labs    01/31/17 2014 01/31/17 2331 02/01/17 0820  GLUCAP 104* 91 103*    Assessment/Plan: S/P Procedure(s) (LRB): CORONARY ARTERY BYPASS GRAFTING (CABG), Times four  using the right saphaneous vein, harvested endoscopicly.  and left internal mammary artery . (N/A) TRANSESOPHAGEAL ECHOCARDIOGRAM (TEE) (N/A) MITRAL VALVE REPAIR (MVR) (N/A) -CV- s/p CABG, mitral repair  Still on dopamine drip- will try to wean today  Atrial paced currently  RESP- LLL atelectasis on CXR- continue IS  RENAL- creatinine up slightly   Continue diuresis  ENDO- CBG well controlled,, change to AC/HS  Anemia secondary to ABL- mild, follow  SCD + enoxaparin for DVT prophylaxis   LOS: 9 days    Melrose Nakayama 02/01/2017

## 2017-02-02 ENCOUNTER — Inpatient Hospital Stay (HOSPITAL_COMMUNITY): Payer: BLUE CROSS/BLUE SHIELD

## 2017-02-02 ENCOUNTER — Encounter (HOSPITAL_COMMUNITY): Payer: Self-pay | Admitting: Surgery

## 2017-02-02 DIAGNOSIS — Z951 Presence of aortocoronary bypass graft: Secondary | ICD-10-CM

## 2017-02-02 DIAGNOSIS — E782 Mixed hyperlipidemia: Secondary | ICD-10-CM

## 2017-02-02 DIAGNOSIS — I4891 Unspecified atrial fibrillation: Secondary | ICD-10-CM

## 2017-02-02 DIAGNOSIS — Z9889 Other specified postprocedural states: Secondary | ICD-10-CM

## 2017-02-02 DIAGNOSIS — I2510 Atherosclerotic heart disease of native coronary artery without angina pectoris: Secondary | ICD-10-CM

## 2017-02-02 LAB — BASIC METABOLIC PANEL
Anion gap: 6 (ref 5–15)
Anion gap: 7 (ref 5–15)
BUN: 18 mg/dL (ref 6–20)
BUN: 23 mg/dL — ABNORMAL HIGH (ref 6–20)
CO2: 26 mmol/L (ref 22–32)
CO2: 25 mmol/L (ref 22–32)
Calcium: 7.9 mg/dL — ABNORMAL LOW (ref 8.9–10.3)
Calcium: 8 mg/dL — ABNORMAL LOW (ref 8.9–10.3)
Chloride: 105 mmol/L (ref 101–111)
Chloride: 104 mmol/L (ref 101–111)
Creatinine, Ser: 1.1 mg/dL — ABNORMAL HIGH (ref 0.44–1.00)
Creatinine, Ser: 1.38 mg/dL — ABNORMAL HIGH (ref 0.44–1.00)
GFR calc Af Amer: >60 mL/min (ref >60)
GFR calc Af Amer: 47 mL/min — ABNORMAL LOW (ref >60)
GFR calc non Af Amer: 53 mL/min — ABNORMAL LOW (ref >60)
GFR calc non Af Amer: 40 mL/min — ABNORMAL LOW (ref >60)
Glucose, Bld: 116 mg/dL — ABNORMAL HIGH (ref 65–99)
Glucose, Bld: 119 mg/dL — ABNORMAL HIGH (ref 65–99)
Potassium: 3.8 mmol/L (ref 3.5–5.1)
Potassium: 3.6 mmol/L (ref 3.5–5.1)
Sodium: 137 mmol/L (ref 135–145)
Sodium: 136 mmol/L (ref 135–145)

## 2017-02-02 LAB — CBC
HCT: 26.2 % — ABNORMAL LOW (ref 36.0–46.0)
Hemoglobin: 8.8 g/dL — ABNORMAL LOW (ref 12.0–15.0)
MCH: 33.7 pg (ref 26.0–34.0)
MCHC: 33.6 g/dL (ref 30.0–36.0)
MCV: 100.4 fL — ABNORMAL HIGH (ref 78.0–100.0)
Platelets: 126 10*3/uL — ABNORMAL LOW (ref 150–400)
RBC: 2.61 MIL/uL — ABNORMAL LOW (ref 3.87–5.11)
RDW: 15.4 % (ref 11.5–15.5)
WBC: 11.1 10*3/uL — ABNORMAL HIGH (ref 4.0–10.5)

## 2017-02-02 LAB — PROTIME-INR
INR: 1.34
Prothrombin Time: 16.5 seconds — ABNORMAL HIGH (ref 11.4–15.2)

## 2017-02-02 LAB — GLUCOSE, CAPILLARY
Glucose-Capillary: 92 mg/dL (ref 65–99)
Glucose-Capillary: 100 mg/dL — ABNORMAL HIGH (ref 65–99)
Glucose-Capillary: 150 mg/dL — ABNORMAL HIGH (ref 65–99)
Glucose-Capillary: 121 mg/dL — ABNORMAL HIGH (ref 65–99)
Glucose-Capillary: 102 mg/dL — ABNORMAL HIGH (ref 65–99)
Glucose-Capillary: 122 mg/dL — ABNORMAL HIGH (ref 65–99)

## 2017-02-02 LAB — MAGNESIUM: Magnesium: 2.4 mg/dL (ref 1.7–2.4)

## 2017-02-02 MED ORDER — AMIODARONE IV BOLUS ONLY 150 MG/100ML
150.0000 mg | Freq: Once | INTRAVENOUS | Status: AC
  Administered 2017-02-02: 150 mg via INTRAVENOUS
  Filled 2017-02-02: qty 100

## 2017-02-02 MED ORDER — AMIODARONE HCL IN DEXTROSE 360-4.14 MG/200ML-% IV SOLN: 30.0000 mg/h | INTRAVENOUS | Status: DC

## 2017-02-02 MED ORDER — SODIUM CHLORIDE 0.9 % IV SOLN
0.0000 ug/min | INTRAVENOUS | Status: DC
  Administered 2017-02-02: 20 ug/min via INTRAVENOUS
  Administered 2017-02-03: 70 ug/min via INTRAVENOUS
  Administered 2017-02-03: 50 ug/min via INTRAVENOUS
  Administered 2017-02-03 – 2017-02-04 (×2): 40 ug/min via INTRAVENOUS
  Filled 2017-02-02 (×4): qty 2
  Filled 2017-02-02: qty 20
  Filled 2017-02-02 (×2): qty 2

## 2017-02-02 MED ORDER — AMIODARONE HCL IN DEXTROSE 360-4.14 MG/200ML-% IV SOLN
30.0000 mg/h | INTRAVENOUS | Status: DC
  Administered 2017-02-03 (×3): 30 mg/h via INTRAVENOUS
  Filled 2017-02-02 (×3): qty 200

## 2017-02-02 MED ORDER — AMIODARONE LOAD VIA INFUSION
150.0000 mg | Freq: Once | INTRAVENOUS | Status: DC
  Filled 2017-02-02: qty 83.34

## 2017-02-02 MED ORDER — AMIODARONE HCL IN DEXTROSE 360-4.14 MG/200ML-% IV SOLN: 60.0000 mg/h | INTRAVENOUS | Status: DC

## 2017-02-02 MED ORDER — AMIODARONE HCL 200 MG PO TABS
400.0000 mg | ORAL_TABLET | Freq: Two times a day (BID) | ORAL | Status: DC
  Administered 2017-02-02: 400 mg via ORAL
  Filled 2017-02-02: qty 2

## 2017-02-02 MED ORDER — AMIODARONE HCL IN DEXTROSE 360-4.14 MG/200ML-% IV SOLN
60.0000 mg/h | INTRAVENOUS | Status: AC
  Administered 2017-02-02: 60 mg/h via INTRAVENOUS
  Filled 2017-02-02: qty 200

## 2017-02-02 MED ORDER — AMIODARONE HCL 200 MG PO TABS: 400.0000 mg | ORAL_TABLET | Freq: Every day | ORAL | Status: DC

## 2017-02-02 MED ORDER — AMIODARONE HCL 200 MG PO TABS: 400.0000 mg | ORAL_TABLET | Freq: Two times a day (BID) | ORAL | Status: DC

## 2017-02-02 MED ORDER — WARFARIN SODIUM 5 MG PO TABS
5.0000 mg | ORAL_TABLET | Freq: Every day | ORAL | Status: DC
  Administered 2017-02-02 – 2017-02-05 (×4): 5 mg via ORAL
  Filled 2017-02-02 (×4): qty 1

## 2017-02-02 MED ORDER — MIDODRINE HCL 5 MG PO TABS
10.0000 mg | ORAL_TABLET | Freq: Three times a day (TID) | ORAL | Status: DC
  Administered 2017-02-02 – 2017-02-09 (×21): 10 mg via ORAL
  Filled 2017-02-02 (×21): qty 2

## 2017-02-02 NOTE — Clinical Social Work Note (Signed)
Clinical Social Work Assessment  Patient Details  Name: [name] MRN: [id number] Date of Birth: Aug 10, 1955  Date of referral:  02/02/17               Reason for consult:  Facility Placement                Permission sought to share information with:  Facility Sport and exercise psychologist, Family Supports Permission granted to share information::  Yes, Verbal Permission Granted  Name::     Engineer, civil (consulting)::  SNF  Relationship::  brother  Contact Information:     Housing/Transportation Living arrangements for the past 2 months:  Single Family Home Source of Information:  Patient Patient Interpreter Needed:  None, [place] Criminal Activity/Legal Involvement Pertinent to Current Situation/Hospitalization:  No - Comment as needed Significant Relationships:  Siblings Lives with:  Self Do you feel safe going back to the place where you live?  No Need for family participation in patient care:  No (Coment)  Care giving concerns:  Pt lives at home alone- does not have 24 hour support and is concerned about mobility following surgery and whether she can manage on her own at home.   Social Worker assessment / plan:  CSW met with pt and pt brother at pt brother request to discuss rehab.  Pt and pt brother interested in rehab placement when pt is ready for DC but unsure of where to go.  CSW provided list of local rehab options.  CSW explained SNF and SNF referral process- informed that we would need to see what PT says now that surgery is complete to assess if rehab is appropriate.  Informed pt of possible barriers- [employer] insurance having strict qualifications for rehab/ possible copays starting day 1 depending on insurance plan.  Employment status:  Therapist, music:  Managed Care PT Recommendations:  Not assessed at this time Information / Referral to community resources:  [place]  Patient/Family's Response to care:  Pt agreeable to SNF if she is in  need of help at time of DC.  Patient/Family's Understanding of and Emotional Response to Diagnosis, Current Treatment, and Prognosis:  Pt has good understanding of current condition and needs- hopeful she will show improvement soon.  Emotional Assessment Appearance:  Appears stated age Attitude/Demeanor/Rapport:    Affect (typically observed):  Appropriate Orientation:  Oriented to Self, Oriented to Place, Oriented to  Time, Oriented to Situation Alcohol / Substance use:  Not Applicable Psych involvement (Current and /or in the community):  No (Comment)  Discharge Needs  Concerns to be addressed:  Care Coordination Readmission within the last 30 days:  No Current discharge risk:  Physical Impairment Barriers to Discharge:  Continued Medical Work up   Jorge Ny, LCSW 02/02/2017, 3:56 PM

## 2017-02-02 NOTE — Progress Notes (Signed)
3 Days Post-Op Procedure(s) (LRB): CORONARY ARTERY BYPASS GRAFTING (CABG), Times four  using the right saphaneous vein, harvested endoscopicly.  and left internal mammary artery . (N/A) TRANSESOPHAGEAL ECHOCARDIOGRAM (TEE) (N/A) MITRAL VALVE REPAIR (MVR) (N/A) Subjective:  No specific complaints  Objective: Vital signs in last 24 hours: Temp:  [97.7 F (36.5 C)-99.2 F (37.3 C)] 98.6 F (37 C) (11/12 0336) Pulse Rate:  [86-94] 86 (11/12 0500) Cardiac Rhythm: Atrial paced (11/12 0600) Resp:  [14-29] 18 (11/12 0600) BP: (88-124)/(40-81) 96/66 (11/12 0600) SpO2:  [94 %-100 %] 94 % (11/12 0500)  Hemodynamic parameters for last 24 hours:    Intake/Output from previous day: 11/11 0701 - 11/12 0700 In: 1224.5 [P.O.:210; I.V.:739.5; IV Piggyback:50] Out: 635 [Urine:635] Intake/Output this shift: No intake/output data recorded.  General appearance: alert and cooperative Neurologic: intact Heart: regular rate and rhythm, S1, S2 normal, no murmur, click, rub or gallop Lungs: clear to auscultation bilaterally Abdomen: soft, non-tender; bowel sounds normal; no masses,  no organomegaly Extremities: edema mild Wound: dressings dry  Lab Results: Recent Labs    02/01/17 0302 02/02/17 0417  WBC 11.4* 11.1*  HGB 9.7* 8.8*  HCT 28.9* 26.2*  PLT 143* 126*   BMET:  Recent Labs    02/01/17 0302 02/02/17 0417  NA 138 137  K 4.3 3.8  CL 106 105  CO2 26 26  GLUCOSE 112* 116*  BUN 17 18  CREATININE 1.17* 1.10*  CALCIUM 8.2* 7.9*    PT/INR:  Recent Labs    02/02/17 0417  LABPROT 16.5*  INR 1.34   ABG    Component Value Date/Time   PHART 7.371 01/30/2017 2321   HCO3 22.0 01/30/2017 2321   TCO2 23 01/31/2017 1632   ACIDBASEDEF 3.0 (H) 01/30/2017 2321   O2SAT 98.0 01/30/2017 2321   CBG (last 3)  Recent Labs    02/01/17 0820 02/01/17 1700 02/01/17 1927  GLUCAP 103* 84 105*   CLINICAL DATA:  Atelectasis.  Follow-up cardiac surgery.  EXAM: PORTABLE CHEST 1  VIEW  COMPARISON:  Portable chest x-ray of February 01, 2017  FINDINGS: The cardiopericardial silhouette remains enlarged. The central pulmonary vascularity is mildly prominent and slightly increased interstitial edema is observed today. There is a small amount of pleural fluid on the right. On the left the retrocardiac region remains dense. There is a prosthetic valve in the mitral position. The sternal wires are intact. The right internal jugular Cordis sheath tip projects over the proximal SVC. There are calcifications in the wall of the aortic arch.  IMPRESSION: Mild CHF. Small right pleural effusion. No pneumothorax. Persistent left lower lobe atelectasis or pneumonia.  Thoracic aortic atherosclerosis.   Electronically Signed   By: David  Martinique M.D.   On: 02/02/2017 07:29  Assessment/Plan: S/P Procedure(s) (LRB): CORONARY ARTERY BYPASS GRAFTING (CABG), Times four  using the right saphaneous vein, harvested endoscopicly.  and left internal mammary artery . (N/A) TRANSESOPHAGEAL ECHOCARDIOGRAM (TEE) (N/A) MITRAL VALVE REPAIR (MVR) (N/A)  POD 3  She is hemodynamically stable but still on dopamine 2 mcg. Her BP is low normal so will hold off on ARB. HR is 73 so will stop Lopressor for now. It was held last night.   Volume excess: weight is about 5.5 lbs over preop. Some interstitial edema on CXR. She will need diuresis once stable off dopamine.  Glucose under good control: stop CBG's and SSI.   Coumadin 5 mg daily until INR bumps.  Ambulate, IS.  Keep foley in for now.  LOS: 10 days    Gaye Pollack 02/02/2017

## 2017-02-02 NOTE — Progress Notes (Signed)
  Amiodarone Drug - Drug Interaction Consult Note  Recommendations: Regarding drug-drug interactions, patient is on Crestor and warfarin. INR is currently 1.34. Will need to monitor levels, and consider empiric warfarin dose reduction. Re-evaluate with home meds if patient requires amiodarone at discharge.  Amiodarone is metabolized by the cytochrome P450 system and therefore has the potential to cause many drug interactions. Amiodarone has an average plasma half-life of 50 days (range 20 to 100 days).   There is potential for drug interactions to occur several weeks or months after stopping treatment and the onset of drug interactions may be slow after initiating amiodarone.   [x]  Statins: Increased risk of myopathy. Simvastatin- restrict dose to 20mg  daily. Other statins: counsel patients to report any muscle pain or weakness immediately.   [x]  Anticoagulants: Amiodarone can increase anticoagulant effect. Consider warfarin dose reduction. Patients should be monitored closely and the dose of anticoagulant altered accordingly, remembering that amiodarone levels take several weeks to stabilize.  []  Antiepileptics: Amiodarone can increase plasma concentration of phenytoin, the dose should be reduced. Note that small changes in phenytoin dose can result in large changes in levels. Monitor patient and counsel on signs of toxicity.  []  Beta blockers: increased risk of bradycardia, AV block and myocardial depression. Sotalol - avoid concomitant use.  []   Calcium channel blockers (diltiazem and verapamil): increased risk of bradycardia, AV block and myocardial depression.  []   Cyclosporine: Amiodarone increases levels of cyclosporine. Reduced dose of cyclosporine is recommended.  []  Digoxin dose should be halved when amiodarone is started.  []  Diuretics: increased risk of cardiotoxicity if hypokalemia occurs.  []  Oral hypoglycemic agents (glyburide, glipizide, glimepiride): increased risk of  hypoglycemia. Patient's glucose levels should be monitored closely when initiating amiodarone therapy.   []  Drugs that prolong the QT interval:  Torsades de pointes risk may be increased with concurrent use - avoid if possible.  Monitor QTc, also keep magnesium/potassium WNL if concurrent therapy can't be avoided. Marland Kitchen Antibiotics: e.g. fluoroquinolones, erythromycin. . Antiarrhythmics: e.g. quinidine, procainamide, disopyramide, sotalol. . Antipsychotics: e.g. phenothiazines, haloperidol.  . Lithium, tricyclic antidepressants, and methadone. Thank You,  Harvel Quale  02/02/2017 6:39 PM

## 2017-02-02 NOTE — Progress Notes (Signed)
Per order RN has made multiple attempts to wean off dopamine. Each time systolic pressure drops into the upper 60's and low 70's. Dopamine at 1 mcg/kg/min only increases systolic to the 96'G. Dopamine is now at 1.5 mcg/kg/min. Urine output is low at this time as well, patient states that is not unusual for her, will continue to monitor closely.

## 2017-02-02 NOTE — Progress Notes (Signed)
Patient ID: [name], female   DOB: May 22, 1955, 61 y.o.   MRN: [id number]  TCTS Evening Rounds:  She has been vasopressor dependent over the past few days and when her dopamine was weaned today BP dropped to 70's. When dopamine restarted she went into atrial fib with RVR when she was up to bathroom. Will stop dopamine, use neo as needed IV to maintain SBP >100 and start oral Midodrine for vasoconstriction. Start amiodarone for atrial fib. Her bowels are moving but did not have that much urine output today. Encourage fluids.

## 2017-02-02 NOTE — Progress Notes (Signed)
Progress Note  Patient Name: [name] Date of Encounter: 02/02/2017  Primary Cardiologist: Angelena Form  Subjective   This afternoon developed palpitations and monitor demonstrated atrial for both rapid ventricular response.  Initial low blood pressure treated by up titrating dopamine which led to further increase in heart rate.  Inpatient Medications    Scheduled Meds: . acetaminophen  1,000 mg Oral Q6H   Or  . acetaminophen (TYLENOL) oral liquid 160 mg/5 mL  1,000 mg Per Tube Q6H  . aspirin EC  325 mg Oral Daily   Or  . aspirin  324 mg Per Tube Daily  . azaTHIOprine  50 mg Oral BID  . bisacodyl  10 mg Oral Daily   Or  . bisacodyl  10 mg Rectal Daily  . calcium carbonate  1 tablet Oral BID WC  . docusate sodium  200 mg Oral Daily  . enoxaparin (LOVENOX) injection  40 mg Subcutaneous QHS  . fluticasone  2 spray Each Nare Daily  . levothyroxine  50 mcg Oral QAC breakfast  . mouth rinse  15 mL Mouth Rinse BID  . methocarbamol  750 mg Oral QID  . pantoprazole  40 mg Oral Daily  . rosuvastatin  40 mg Oral q1800  . sodium chloride flush  3 mL Intravenous Q12H  . warfarin  5 mg Oral q1800  . Warfarin - Physician Dosing Inpatient   Does not apply q1800   Continuous Infusions: . sodium chloride Stopped (01/31/17 1600)  . sodium chloride    . sodium chloride Stopped (01/31/17 1600)  . DOPamine 2 mcg/kg/min (02/02/17 1700)  . lactated ringers Stopped (02/02/17 1101)  . lactated ringers 20 mL/hr at 02/01/17 1100  . phenylephrine (NEO-SYNEPHRINE) Adult infusion Stopped (01/30/17 1545)   PRN Meds: sodium chloride, morphine injection, ondansetron (ZOFRAN) IV, oxyCODONE, promethazine, sodium chloride flush, traMADol   Vital Signs    Vitals:   02/02/17 1400 02/02/17 1500 02/02/17 1600 02/02/17 1700  BP: 113/62 (!) 71/46 90/60 129/80  Pulse:      Resp: 19 (!) 22 18 (!) 22  Temp:   98.9 F (37.2 C)   TempSrc:   Oral   SpO2:      Weight:      Height:         Intake/Output Summary (Last 24 hours) at 02/02/2017 1716 Last data filed at 02/02/2017 1630 Gross per 24 hour  Intake 1145.3 ml  Output 295 ml  Net 850.3 ml   Filed Weights   01/31/17 0430 02/01/17 0600 02/02/17 0500  Weight: 259 lb 0.7 oz (117.5 kg) 253 lb 8.5 oz (115 kg) 252 lb 1.6 oz (114.4 kg)    Telemetry    A. fib with rapid ventricular response- Personally Reviewed  ECG    The most recent prior tracing on 01/31/2017 reveals sinus bradycardia with no acute ST or T wave changes.- Personally Reviewed  Physical Exam  Lying at 45 degrees.  Appearing somewhat apprehensive GEN: No acute distress.   Neck: No JVD Cardiac:  Rapid IIRR, no murmurs, rubs, or gallops.  Respiratory: Clear to auscultation bilaterally. GI: Soft, nontender, non-distended  MS: No edema; No deformity. Neuro:  Nonfocal  Psych: Anxious  Labs    Chemistry Recent Labs  Lab 01/31/17 0415 01/31/17 1617 01/31/17 1632 02/01/17 0302 02/02/17 0417  NA 139  --  138 138 137  K 4.5  --  4.2 4.3 3.8  CL 110  --  106 106 105  CO2 24  --   --  26 26  GLUCOSE 103*  --  106* 112* 116*  BUN 10  --  14 17 18   CREATININE 0.90 1.03* 0.90 1.17* 1.10*  CALCIUM 8.0*  --   --  8.2* 7.9*  GFRNONAA >60 57*  --  49* 53*  GFRAA >60 >60  --  57* >60  ANIONGAP 5  --   --  6 6     Hematology Recent Labs  Lab 01/31/17 1617 01/31/17 1632 02/01/17 0302 02/02/17 0417  WBC 10.0  --  11.4* 11.1*  RBC 2.84*  --  2.88* 2.61*  HGB 9.7* 9.9* 9.7* 8.8*  HCT 28.4* 29.0* 28.9* 26.2*  MCV 100.0  --  100.3* 100.4*  MCH 34.2*  --  33.7 33.7  MCHC 34.2  --  33.6 33.6  RDW 15.2  --  15.5 15.4  PLT 152  --  143* 126*    Cardiac EnzymesNo results for input(s): TROPONINI in the last 168 hours. No results for input(s): TROPIPOC in the last 168 hours.   BNPNo results for input(s): BNP, PROBNP in the last 168 hours.   DDimer No results for input(s): DDIMER in the last 168 hours.   Radiology    Dg Chest Port 1  View  Result Date: 02/02/2017 CLINICAL DATA:  Atelectasis.  Follow-up cardiac surgery. EXAM: PORTABLE CHEST 1 VIEW COMPARISON:  Portable chest x-ray of February 01, 2017 FINDINGS: The cardiopericardial silhouette remains enlarged. The central pulmonary vascularity is mildly prominent and slightly increased interstitial edema is observed today. There is a small amount of pleural fluid on the right. On the left the retrocardiac region remains dense. There is a prosthetic valve in the mitral position. The sternal wires are intact. The right internal jugular Cordis sheath tip projects over the proximal SVC. There are calcifications in the wall of the aortic arch. IMPRESSION: Mild CHF. Small right pleural effusion. No pneumothorax. Persistent left lower lobe atelectasis or pneumonia. Thoracic aortic atherosclerosis. Electronically Signed   By: David  Martinique M.D.   On: 02/02/2017 07:29   Dg Chest Port 1 View  Result Date: 02/01/2017 CLINICAL DATA:  Atelectasis EXAM: PORTABLE CHEST 1 VIEW COMPARISON:  01/31/2017 FINDINGS: Interval removal of Swan-Ganz catheter. Sheath remains in place. Pericardial and left chest tubes removed. No pneumothorax. Mild left lower lobe atelectasis unchanged. Negative for edema. Right lung clear. IMPRESSION: Left chest tube removed.  No pneumothorax. Left lower lobe atelectasis unchanged. Electronically Signed   By: Franchot Gallo M.D.   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 She is a former pt of Dr. Melvern Banker and now followed by Dr. Angelena Form. Cath revealed severe three-vessel CAD with stent failure including total occlusion of the right coronary and  high-grade ISR obstruction in the circumflex and de novo high-grade LAD with occlusion of the first diagonal.  Underwent successful CABG on 01/30/17 with LIMA to LAD, SVG to OM 1 and 2, and SVG to PDA.  Postop A. fib on 02/02/2017   Assessment & Plan    1.  Atrial fibrillation with rapid ventricular response, first occurrence now 3 days post coronary bypass grafting.  Plan  IV amiodarone and use phenylephrine as needed for blood pressure support.  Current blood pressure is adequate.  Agree with DC dopamine as this was driving up heart rate. 2.  Relatively soft blood pressures.  Currently 129/80 mmHg but earlier today. 3.  Multivessel coronary disease status post coronary bypass grafting as outlined above.  For questions or updates, please contact [place] Please consult [email or web address] for contact info under Cardiology/STEMI.      Signed, Sinclair Grooms, MD  02/02/2017, 5:16 PM

## 2017-02-03 LAB — PROTIME-INR
INR: 1.14
Prothrombin Time: 14.5 seconds (ref 11.4–15.2)

## 2017-02-03 LAB — GLUCOSE, CAPILLARY
Glucose-Capillary: 95 mg/dL (ref 65–99)
Glucose-Capillary: 100 mg/dL — ABNORMAL HIGH (ref 65–99)
Glucose-Capillary: 103 mg/dL — ABNORMAL HIGH (ref 65–99)
Glucose-Capillary: 110 mg/dL — ABNORMAL HIGH (ref 65–99)

## 2017-02-03 LAB — BASIC METABOLIC PANEL
Anion gap: 8 (ref 5–15)
BUN: 25 mg/dL — ABNORMAL HIGH (ref 6–20)
CO2: 22 mmol/L (ref 22–32)
Calcium: 7.9 mg/dL — ABNORMAL LOW (ref 8.9–10.3)
Chloride: 106 mmol/L (ref 101–111)
Creatinine, Ser: 1.32 mg/dL — ABNORMAL HIGH (ref 0.44–1.00)
GFR calc Af Amer: 49 mL/min — ABNORMAL LOW (ref >60)
GFR calc non Af Amer: 43 mL/min — ABNORMAL LOW (ref >60)
Glucose, Bld: 108 mg/dL — ABNORMAL HIGH (ref 65–99)
Potassium: 4 mmol/L (ref 3.5–5.1)
Sodium: 136 mmol/L (ref 135–145)

## 2017-02-03 LAB — MAGNESIUM: Magnesium: 2.4 mg/dL (ref 1.7–2.4)

## 2017-02-03 LAB — TSH: TSH: 5.778 u[IU]/mL — ABNORMAL HIGH (ref 0.350–4.500)

## 2017-02-03 MED ORDER — LEVOTHYROXINE SODIUM 75 MCG PO TABS
75.0000 ug | ORAL_TABLET | Freq: Every day | ORAL | Status: DC
  Administered 2017-02-04 – 2017-02-11 (×8): 75 ug via ORAL
  Filled 2017-02-03 (×8): qty 1

## 2017-02-03 MED ORDER — POTASSIUM CHLORIDE 10 MEQ/50ML IV SOLN
10.0000 meq | INTRAVENOUS | Status: AC
  Administered 2017-02-03 (×3): 10 meq via INTRAVENOUS
  Filled 2017-02-03 (×2): qty 50

## 2017-02-03 MED ORDER — PATIENT'S GUIDE TO USING COUMADIN BOOK
Freq: Once | Status: AC
  Administered 2017-02-03: 17:00:00
  Filled 2017-02-03: qty 1

## 2017-02-03 MED ORDER — POTASSIUM CHLORIDE 10 MEQ/50ML IV SOLN
INTRAVENOUS | Status: AC
  Filled 2017-02-03: qty 50

## 2017-02-03 NOTE — Progress Notes (Signed)
Foley removed this morning per patient request. Since then she has not voided and states she does not feel like she needs to go yet. Patient has been asked multiple times if she would like to use the bathroom but denies need. Bladder scan was done at approximately 5 pm and patient only has 120 ml present.

## 2017-02-03 NOTE — Progress Notes (Signed)
Progress Note  Patient Name: [name] Date of Encounter: 02/03/2017  Primary Cardiologist: Angelena Form  Subjective   Feels better this morning now back in sinus rhythm.  Having difficulty with abdominal bloating and constipation.  Inpatient Medications    Scheduled Meds: . acetaminophen  1,000 mg Oral Q6H   Or  . acetaminophen (TYLENOL) oral liquid 160 mg/5 mL  1,000 mg Per Tube Q6H  . amiodarone  150 mg Intravenous Once  . aspirin EC  325 mg Oral Daily   Or  . aspirin  324 mg Per Tube Daily  . azaTHIOprine  50 mg Oral BID  . calcium carbonate  1 tablet Oral BID WC  . enoxaparin (LOVENOX) injection  40 mg Subcutaneous QHS  . fluticasone  2 spray Each Nare Daily  . levothyroxine  50 mcg Oral QAC breakfast  . mouth rinse  15 mL Mouth Rinse BID  . methocarbamol  750 mg Oral QID  . midodrine  10 mg Oral TID WC  . pantoprazole  40 mg Oral Daily  . rosuvastatin  40 mg Oral q1800  . sodium chloride flush  3 mL Intravenous Q12H  . warfarin  5 mg Oral q1800  . Warfarin - Physician Dosing Inpatient   Does not apply q1800   Continuous Infusions: . sodium chloride Stopped (01/31/17 1600)  . sodium chloride    . sodium chloride Stopped (01/31/17 1600)  . amiodarone 30 mg/hr (02/03/17 1100)  . lactated ringers Stopped (02/02/17 1101)  . lactated ringers 20 mL/hr at 02/01/17 1100  . phenylephrine (NEO-SYNEPHRINE) Adult infusion 60 mcg/min (02/03/17 1031)   PRN Meds: sodium chloride, morphine injection, ondansetron (ZOFRAN) IV, oxyCODONE, promethazine, sodium chloride flush, traMADol   Vital Signs    Vitals:   02/03/17 0821 02/03/17 0900 02/03/17 1000 02/03/17 1100  BP: (!) 105/55  104/64 103/72  Pulse: 63 61 (!) 59 80  Resp: 18 (!) 22 (!) 21 (!) 21  Temp: 98.8 F (37.1 C)     TempSrc: Oral     SpO2: 96% 98% 95% 94%  Weight:      Height:        Intake/Output Summary (Last 24 hours) at 02/03/2017 1117 Last data filed at 02/03/2017 1100 Gross per 24 hour  Intake  1927.41 ml  Output 86 ml  Net 1841.41 ml   Filed Weights   02/01/17 0600 02/02/17 0500 02/03/17 0400  Weight: 253 lb 8.5 oz (115 kg) 252 lb 1.6 oz (114.4 kg) 254 lb 6.6 oz (115.4 kg)    Telemetry    Normal sinus rhythm.  A. fib converted last evening.- Personally Reviewed  ECG    No new tracing available with that performed on 01/31/17 showing normal sinus rhythm with no ischemic change.- Personally Reviewed  Physical Exam  Obese GEN: No acute distress.   Neck: No JVD Cardiac: RRR, no murmurs, rubs, or gallops.  Respiratory: Clear to auscultation bilaterally. GI: Soft, nontender, non-distended  MS: No edema; No deformity. Neuro:  Nonfocal  Psych: Normal affect   Labs    Chemistry Recent Labs  Lab 02/02/17 0417 02/02/17 2246 02/03/17 0411  NA 137 136 136  K 3.8 3.6 4.0  CL 105 104 106  CO2 26 25 22   GLUCOSE 116* 119* 108*  BUN 18 23* 25*  CREATININE 1.10* 1.38* 1.32*  CALCIUM 7.9* 8.0* 7.9*  GFRNONAA 53* 40* 43*  GFRAA >60 47* 49*  ANIONGAP 6 7 8      Hematology Recent Labs  Lab  01/31/17 1617 01/31/17 1632 02/01/17 0302 02/02/17 0417  WBC 10.0  --  11.4* 11.1*  RBC 2.84*  --  2.88* 2.61*  HGB 9.7* 9.9* 9.7* 8.8*  HCT 28.4* 29.0* 28.9* 26.2*  MCV 100.0  --  100.3* 100.4*  MCH 34.2*  --  33.7 33.7  MCHC 34.2  --  33.6 33.6  RDW 15.2  --  15.5 15.4  PLT 152  --  143* 126*    Cardiac EnzymesNo results for input(s): TROPONINI in the last 168 hours. No results for input(s): TROPIPOC in the last 168 hours.   BNPNo results for input(s): BNP, PROBNP in the last 168 hours.   DDimer No results for input(s): DDIMER in the last 168 hours.   Radiology    Dg Chest Port 1 View  Result Date: 02/02/2017 CLINICAL DATA:  Atelectasis.  Follow-up cardiac surgery. EXAM: PORTABLE CHEST 1 VIEW COMPARISON:  Portable chest x-ray of February 01, 2017 FINDINGS: The cardiopericardial silhouette remains enlarged. The central pulmonary vascularity is mildly prominent and  slightly increased interstitial edema is observed today. There is a small amount of pleural fluid on the right. On the left the retrocardiac region remains dense. There is a prosthetic valve in the mitral position. The sternal wires are intact. The right internal jugular Cordis sheath tip projects over the proximal SVC. There are calcifications in the wall of the aortic arch. IMPRESSION: Mild CHF. Small right pleural effusion. No pneumothorax. Persistent left lower lobe atelectasis or pneumonia. Thoracic aortic atherosclerosis. Electronically Signed   By: David  Martinique M.D.   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 She is a former pt of Dr. Melvern Banker and now followed by Dr. Angelena Form. Cath revealed severe three-vessel CAD with stent failure including total occlusion of the right coronary and high-grade ISR obstruction in the circumflex and de novo high-grade LAD with occlusion of the first diagonal.  Underwent successful CABG on 01/30/17 with LIMA to LAD, SVG to OM 1 and 2, and SVG to PDA.  Postop A. fib on 02/02/2017 and converted with IV amiodarone.   Assessment & Plan    1.  Atrial fibrillation has converted to normal sinus rhythm on IV amiodarone. 2.  Hypotension requiring IV Neo-Synephrine.  Midrin has been started orally. 3.  CAD is stable now status post bypass surgery on 01/30/17.  Would recommend converting to p.o. amiodarone in the next 12-24 hours and continuing beyond discharge with discontinuation in 4-6 weeks.  For questions or updates, please contact [place] Please consult [email or web address] for contact info under Cardiology/STEMI.      Signed, Sinclair Grooms, MD  02/03/2017, 11:17 AM

## 2017-02-03 NOTE — Progress Notes (Signed)
Patient ID: EMALEA MIX, female   DOB: [birth year], 61 y.o.   MRN: [id number] TCTS Evening Rounds:  Had a good day BP 110/85 on neo 40 mcg. Weaned from 70 mcg this am.  Sinus on amio with atrial pacing at 80.  Has not voided yet today. Foley removed this am because it was bothering her. Bladder scan showed 200 cc in bladder. Will continue to follow.

## 2017-02-03 NOTE — Progress Notes (Signed)
4 Days Post-Op Procedure(s) (LRB): CORONARY ARTERY BYPASS GRAFTING (CABG), Times four  using the right saphaneous vein, harvested endoscopicly.  and left internal mammary artery . (N/A) TRANSESOPHAGEAL ECHOCARDIOGRAM (TEE) (N/A) MITRAL VALVE REPAIR (MVR) (N/A) Subjective: Pain controlled. Hungry, wants to advance diet  Objective: Vital signs in last 24 hours: Temp:  [98.8 F (37.1 C)-99 F (37.2 C)] 98.8 F (37.1 C) (11/13 0821) Pulse Rate:  [56-81] 59 (11/13 1000) Cardiac Rhythm: Normal sinus rhythm (11/13 0730) Resp:  [15-25] 21 (11/13 1000) BP: (66-176)/(44-143) 104/64 (11/13 1000) SpO2:  [77 %-100 %] 95 % (11/13 1000) Weight:  [115.4 kg (254 lb 6.6 oz)] 115.4 kg (254 lb 6.6 oz) (11/13 0400)  Hemodynamic parameters for last 24 hours:    Intake/Output from previous day: 11/12 0701 - 11/13 0700 In: 1711.1 [P.O.:420; I.V.:926.1; IV Piggyback:100] Out: 86 [Urine:80; Stool:6] Intake/Output this shift: Total I/O In: 710.1 [I.V.:680.1; Other:30] Out: -   General appearance: alert and cooperative Neurologic: intact Heart: regular rate and rhythm, S1, S2 normal, no murmur, click, rub or gallop Lungs: clear to auscultation bilaterally Extremities: edema mild Wound: incisions ok  Lab Results: Recent Labs    02/01/17 0302 02/02/17 0417  WBC 11.4* 11.1*  HGB 9.7* 8.8*  HCT 28.9* 26.2*  PLT 143* 126*   BMET:  Recent Labs    02/02/17 2246 02/03/17 0411  NA 136 136  K 3.6 4.0  CL 104 106  CO2 25 22  GLUCOSE 119* 108*  BUN 23* 25*  CREATININE 1.38* 1.32*  CALCIUM 8.0* 7.9*    PT/INR:  Recent Labs    02/03/17 0411  LABPROT 14.5  INR 1.14   ABG    Component Value Date/Time   PHART 7.371 01/30/2017 2321   HCO3 22.0 01/30/2017 2321   TCO2 23 01/31/2017 1632   ACIDBASEDEF 3.0 (H) 01/30/2017 2321   O2SAT 98.0 01/30/2017 2321   CBG (last 3)  Recent Labs    02/02/17 1527 02/02/17 2256 02/03/17 0823  GLUCAP 102* 122* 95    Assessment/Plan: S/P  Procedure(s) (LRB): CORONARY ARTERY BYPASS GRAFTING (CABG), Times four  using the right saphaneous vein, harvested endoscopicly.  and left internal mammary artery . (N/A) TRANSESOPHAGEAL ECHOCARDIOGRAM (TEE) (N/A) MITRAL VALVE REPAIR (MVR) (N/A) POD 4 She is still dependent on neo. Midodrine started last night. Wean neo as tolerated to keep SBP> 100. No beta blocker due to low BP.  Postop atrial fibrillation: converted with IV amio. HR 60's sinus now. Will continue atrial pacing at 80 until BP rises. Continue IV amio today and switch to po tomorrow.  Volume excess: hold off on diuresis until BP is better.  Continue Coumadin for mitral annuloplasty  Foley removed today  Ambulate, IS.    LOS: 11 days    Gaye Pollack 02/03/2017

## 2017-02-04 DIAGNOSIS — I480 Paroxysmal atrial fibrillation: Secondary | ICD-10-CM

## 2017-02-04 LAB — GLUCOSE, CAPILLARY
Glucose-Capillary: 102 mg/dL — ABNORMAL HIGH (ref 65–99)
Glucose-Capillary: 95 mg/dL (ref 65–99)
Glucose-Capillary: 111 mg/dL — ABNORMAL HIGH (ref 65–99)
Glucose-Capillary: 103 mg/dL — ABNORMAL HIGH (ref 65–99)

## 2017-02-04 LAB — BASIC METABOLIC PANEL
Anion gap: 8 (ref 5–15)
BUN: 19 mg/dL (ref 6–20)
CO2: 23 mmol/L (ref 22–32)
Calcium: 8 mg/dL — ABNORMAL LOW (ref 8.9–10.3)
Chloride: 106 mmol/L (ref 101–111)
Creatinine, Ser: 1.01 mg/dL — ABNORMAL HIGH (ref 0.44–1.00)
GFR calc Af Amer: >60 mL/min (ref >60)
GFR calc non Af Amer: 59 mL/min — ABNORMAL LOW (ref >60)
Glucose, Bld: 116 mg/dL — ABNORMAL HIGH (ref 65–99)
Potassium: 3.4 mmol/L — ABNORMAL LOW (ref 3.5–5.1)
Sodium: 137 mmol/L (ref 135–145)

## 2017-02-04 LAB — PROTIME-INR
INR: 1.22
Prothrombin Time: 15.3 seconds — ABNORMAL HIGH (ref 11.4–15.2)

## 2017-02-04 MED ORDER — POTASSIUM CHLORIDE 10 MEQ/50ML IV SOLN
10.0000 meq | INTRAVENOUS | Status: AC
  Administered 2017-02-04 (×3): 10 meq via INTRAVENOUS
  Filled 2017-02-04 (×3): qty 50

## 2017-02-04 MED ORDER — AMIODARONE IV BOLUS ONLY 150 MG/100ML: 150.0000 mg | Freq: Once | INTRAVENOUS | Status: AC

## 2017-02-04 MED ORDER — AMIODARONE HCL IN DEXTROSE 360-4.14 MG/200ML-% IV SOLN
INTRAVENOUS | Status: AC
  Administered 2017-02-04: 150 mg/h
  Filled 2017-02-04: qty 200

## 2017-02-04 MED ORDER — AMIODARONE HCL 200 MG PO TABS
400.0000 mg | ORAL_TABLET | Freq: Two times a day (BID) | ORAL | Status: DC
  Administered 2017-02-04 (×2): 400 mg via ORAL
  Filled 2017-02-04 (×2): qty 2

## 2017-02-04 MED FILL — Potassium Chloride Inj 2 mEq/ML: INTRAVENOUS | Qty: 20 | Status: AC

## 2017-02-04 MED FILL — Magnesium Sulfate Inj 50%: INTRAMUSCULAR | Qty: 10 | Status: AC

## 2017-02-04 MED FILL — Heparin Sodium (Porcine) Inj 1000 Unit/ML: INTRAMUSCULAR | Qty: 30 | Status: AC

## 2017-02-04 MED FILL — Heparin Sodium (Porcine) Inj 1000 Unit/ML: INTRAMUSCULAR | Qty: 2500 | Status: AC

## 2017-02-04 NOTE — Progress Notes (Signed)
The patient has been seen in conjunction with Ledell Noss, MD. All aspects of care have been considered and discussed. The patient has been personally interviewed, examined, and all clinical data has been reviewed.   Maintaining normal sinus rhythm.  Converted to p.o. amiodarone therapy and warfarin has been started.  Overall, doing well. Progress Note  Patient Name: [name] Date of Encounter: 02/04/2017  Primary Cardiologist: Angelena Form   Subjective   Having lower back pain overnight but otherwise feeling okay, not in atrial fibrillation, no chest pain or difficulty breathing   Inpatient Medications    Scheduled Meds: . acetaminophen  1,000 mg Oral Q6H   Or  . acetaminophen (TYLENOL) oral liquid 160 mg/5 mL  1,000 mg Per Tube Q6H  . amiodarone  150 mg Intravenous Once  . aspirin EC  325 mg Oral Daily   Or  . aspirin  324 mg Per Tube Daily  . azaTHIOprine  50 mg Oral BID  . calcium carbonate  1 tablet Oral BID WC  . enoxaparin (LOVENOX) injection  40 mg Subcutaneous QHS  . fluticasone  2 spray Each Nare Daily  . levothyroxine  75 mcg Oral QAC breakfast  . mouth rinse  15 mL Mouth Rinse BID  . methocarbamol  750 mg Oral QID  . midodrine  10 mg Oral TID WC  . pantoprazole  40 mg Oral Daily  . rosuvastatin  40 mg Oral q1800  . sodium chloride flush  3 mL Intravenous Q12H  . warfarin  5 mg Oral q1800  . Warfarin - Physician Dosing Inpatient   Does not apply q1800   Continuous Infusions: . sodium chloride Stopped (01/31/17 1600)  . sodium chloride    . sodium chloride Stopped (01/31/17 1600)  . amiodarone 30 mg/hr (02/03/17 2313)  . lactated ringers Stopped (02/02/17 1101)  . lactated ringers 20 mL/hr at 02/01/17 1100  . phenylephrine (NEO-SYNEPHRINE) Adult infusion 35 mcg/min (02/04/17 0445)   PRN Meds: sodium chloride, morphine injection, ondansetron (ZOFRAN) IV, oxyCODONE, promethazine, sodium chloride flush, traMADol   Vital Signs    Vitals:   02/04/17  0400 02/04/17 0401 02/04/17 0430 02/04/17 0501  BP:  (!) 115/91 (!) 155/74 120/72  Pulse:      Resp:  20 (!) 22 (!) 25  Temp: 98.8 F (37.1 C)     TempSrc: Oral     SpO2:      Weight:      Height:        Intake/Output Summary (Last 24 hours) at 02/04/2017 0644 Last data filed at 02/04/2017 0157 Gross per 24 hour  Intake 1209.9 ml  Output 545 ml  Net 664.9 ml   Filed Weights   02/01/17 0600 02/02/17 0500 02/03/17 0400  Weight: 253 lb 8.5 oz (115 kg) 252 lb 1.6 oz (114.4 kg) 254 lb 6.6 oz (115.4 kg)    Telemetry    Paced rhythm - Personally Reviewed  Physical Exam   GEN: Sitting up in chair watching tv, No acute distress.   Neck: No JVD Cardiac: distant heart sounds, RRR, no murmurs, rubs, or gallops.  Respiratory: Clear to auscultation bilaterally. GI: Soft, nontender, non-distended  MS: trace peripheral edema; No deformity. Neuro:  Nonfocal  Psych: Normal affect   Labs    Chemistry Recent Labs  Lab 02/02/17 2246 02/03/17 0411 02/04/17 0542  NA 136 136 137  K 3.6 4.0 3.4*  CL 104 106 106  CO2 25 22 23   GLUCOSE 119* 108* 116*  BUN  23* 25* 19  CREATININE 1.38* 1.32* 1.01*  CALCIUM 8.0* 7.9* 8.0*  GFRNONAA 40* 43* 59*  GFRAA 47* 49* >60  ANIONGAP 7 8 8      Hematology Recent Labs  Lab 01/31/17 1617 01/31/17 1632 02/01/17 0302 02/02/17 0417  WBC 10.0  --  11.4* 11.1*  RBC 2.84*  --  2.88* 2.61*  HGB 9.7* 9.9* 9.7* 8.8*  HCT 28.4* 29.0* 28.9* 26.2*  MCV 100.0  --  100.3* 100.4*  MCH 34.2*  --  33.7 33.7  MCHC 34.2  --  33.6 33.6  RDW 15.2  --  15.5 15.4  PLT 152  --  143* 126*    Cardiac EnzymesNo results for input(s): TROPONINI in the last 168 hours. No results for input(s): TROPIPOC in the last 168 hours.   BNPNo results for input(s): BNP, PROBNP in the last 168 hours.   DDimer No results for input(s): DDIMER in the last 168 hours.   Radiology    No results found.  Cardiac Studies   No new data   Patient Profile     62 y.o.  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 She is a former pt of Dr. Darlina Guys now followed by Dr. Angelena Form.Cath revealed severe three-vessel CAD with stent failure including total occlusion of the right coronary and high-grade ISR obstruction in the circumflex and de novo high-grade LAD with occlusion of the first diagonal. Underwent successful CABG on 01/30/17 with LIMA to LAD, SVG to OM 1 and 2, and SVG to PDA. Postop A. fib on 02/02/2017 and converted with IV amiodarone.   Assessment & Plan    Atrial fibrillation - Was in sinus rhythm on amio ggt this morning, converted to po Amiodarone 400 mg BID today   Hypotension - blood pressure well controlled, currently 120/72 on phenylephrine 20 mg at 35 mcg/min and po midodrine 10 mg qd, may be able to further titrate the neo gtt today   CAD now s/p CABG 11/9 - stable, no chest pain or shortness of breath   For questions or updates, please contact [place] Please consult [email or web address] for contact info under Cardiology/STEMI.      Signed, Ledell Noss, MD  02/04/2017, 6:44 AM

## 2017-02-04 NOTE — Progress Notes (Signed)
Paged MD Bartle regarding patient going back into Atrial Fib with a rate of 100-130s. Will give a 150mg  bolus STAT per Dr Cyndia Bent.  Siri Cole RN

## 2017-02-04 NOTE — Progress Notes (Signed)
5 Days Post-Op Procedure(s) (LRB): CORONARY ARTERY BYPASS GRAFTING (CABG), Times four  using the right saphaneous vein, harvested endoscopicly.  and left internal mammary artery . (N/A) TRANSESOPHAGEAL ECHOCARDIOGRAM (TEE) (N/A) MITRAL VALVE REPAIR (MVR) (N/A) Subjective:  Had some back pain this am and took Ultram. Has some nausea related to that. Says she does not do well with narcotics.  Objective: Vital signs in last 24 hours: Temp:  [98.7 F (37.1 C)-98.9 F (37.2 C)] 98.8 F (37.1 C) (11/14 0400) Pulse Rate:  [57-102] 81 (11/13 2100) Cardiac Rhythm: Atrial paced (11/14 0400) Resp:  [16-28] 20 (11/14 0700) BP: (70-155)/(50-91) 142/80 (11/14 0700) SpO2:  [82 %-98 %] 94 % (11/14 0400) Weight:  [116.7 kg (257 lb 4.4 oz)] 116.7 kg (257 lb 4.4 oz) (11/14 0600)  Hemodynamic parameters for last 24 hours:    Intake/Output from previous day: 11/13 0701 - 11/14 0700 In: 1722.8 [P.O.:480; I.V.:1212.8] Out: 795 [Urine:795] Intake/Output this shift: No intake/output data recorded.  General appearance: alert and cooperative Neurologic: intact Heart: regular rate and rhythm, S1, S2 normal, no murmur, click, rub or gallop Lungs: clear to auscultation bilaterally Abdomen: soft, non-tender; bowel sounds normal; no masses,  no organomegaly Extremities: edema mild Wound: incisions ok  Lab Results: Recent Labs    02/02/17 0417  WBC 11.1*  HGB 8.8*  HCT 26.2*  PLT 126*   BMET:  Recent Labs    02/03/17 0411 02/04/17 0542  NA 136 137  K 4.0 3.4*  CL 106 106  CO2 22 23  GLUCOSE 108* 116*  BUN 25* 19  CREATININE 1.32* 1.01*  CALCIUM 7.9* 8.0*    PT/INR:  Recent Labs    02/04/17 0542  LABPROT 15.3*  INR 1.22   ABG    Component Value Date/Time   PHART 7.371 01/30/2017 2321   HCO3 22.0 01/30/2017 2321   TCO2 23 01/31/2017 1632   ACIDBASEDEF 3.0 (H) 01/30/2017 2321   O2SAT 98.0 01/30/2017 2321   CBG (last 3)  Recent Labs    02/03/17 1155 02/03/17 1638  02/03/17 2118  GLUCAP 100* 103* 110*    Assessment/Plan: S/P Procedure(s) (LRB): CORONARY ARTERY BYPASS GRAFTING (CABG), Times four  using the right saphaneous vein, harvested endoscopicly.  and left internal mammary artery . (N/A) TRANSESOPHAGEAL ECHOCARDIOGRAM (TEE) (N/A) MITRAL VALVE REPAIR (MVR) (N/A)  POD 5 Her BP is improving with Midodrine so will wean off neo.  Rhythm is sinus 60's. Will continue atrial pacing until off neo. Switch amio to po.  Volume excess: wt is 10 lbs over preop. Diurese once off neo.  Continue Coumadin for mitral annuloplasty  Ambulate, IS  PT, OT evaluations for rehab/SNF   LOS: 12 days    Gaye Pollack 02/04/2017

## 2017-02-04 NOTE — Progress Notes (Signed)
      [address]       [place] [address]             [phone]      BP (!) 144/79   Pulse (!) 118   Temp 98.3 F (36.8 C) (Oral)   Resp 16   Ht 5\' 7"  (1.702 m)   Wt 257 lb 4.4 oz (116.7 kg)   SpO2 93%   BMI 40.30 kg/m   Intake/Output Summary (Last 24 hours) at 02/04/2017 2103 Last data filed at 02/04/2017 1300 Gross per 24 hour  Intake 835.98 ml  Output 450 ml  Net 385.98 ml   Off neo drip  Remo Lipps C. Roxan Hockey, MD [facility] [phone]

## 2017-02-04 NOTE — Care Management Note (Addendum)
Case Management Note  Patient Details  Name: CARLEY GLENDENNING MRN: [id number] Date of Birth: 03-13-1956  Subjective/Objective:  From home alone, does not have 24 hr support, POD 5 CABG , MVR, weaning neo, changed amio to po , cont to diurese. Await pt/ot eval. CSW also following for possible SNF referral.    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  She states she will not be going to a SNF, she states this was some of her family members talking about SNF, but she will be going home.                  Action/Plan: NCM will follow for dc needs.  Expected Discharge Date:                  Expected Discharge Plan:     In-House Referral:     Discharge planning Services  CM Consult  Post Acute Care Choice:    Choice offered to:     DME Arranged:    DME Agency:     HH Arranged:    HH Agency:     Status of Service:  In process, will continue to follow  If discussed at Long Length of Stay Meetings, dates discussed:    Additional Comments:  Zenon Mayo, RN 02/04/2017, 5:21 PM

## 2017-02-05 ENCOUNTER — Encounter (HOSPITAL_COMMUNITY): Payer: Self-pay

## 2017-02-05 LAB — BASIC METABOLIC PANEL
Anion gap: 8 (ref 5–15)
BUN: 15 mg/dL (ref 6–20)
CO2: 22 mmol/L (ref 22–32)
Calcium: 8.3 mg/dL — ABNORMAL LOW (ref 8.9–10.3)
Chloride: 107 mmol/L (ref 101–111)
Creatinine, Ser: 0.97 mg/dL (ref 0.44–1.00)
GFR calc Af Amer: >60 mL/min (ref >60)
GFR calc non Af Amer: >60 mL/min (ref >60)
Glucose, Bld: 106 mg/dL — ABNORMAL HIGH (ref 65–99)
Potassium: 3.9 mmol/L (ref 3.5–5.1)
Sodium: 137 mmol/L (ref 135–145)

## 2017-02-05 LAB — GLUCOSE, CAPILLARY
Glucose-Capillary: 95 mg/dL (ref 65–99)
Glucose-Capillary: 86 mg/dL (ref 65–99)
Glucose-Capillary: 88 mg/dL (ref 65–99)
Glucose-Capillary: 110 mg/dL — ABNORMAL HIGH (ref 65–99)

## 2017-02-05 LAB — PROTIME-INR
INR: 1.55
Prothrombin Time: 18.5 seconds — ABNORMAL HIGH (ref 11.4–15.2)

## 2017-02-05 MED ORDER — DIGOXIN 125 MCG PO TABS
0.1250 mg | ORAL_TABLET | Freq: Every day | ORAL | Status: DC
  Administered 2017-02-06 – 2017-02-09 (×4): 0.125 mg via ORAL
  Filled 2017-02-05 (×4): qty 1

## 2017-02-05 MED ORDER — AMIODARONE HCL 200 MG PO TABS
200.0000 mg | ORAL_TABLET | Freq: Two times a day (BID) | ORAL | Status: DC
  Administered 2017-02-05 – 2017-02-08 (×7): 200 mg via ORAL
  Filled 2017-02-05 (×7): qty 1

## 2017-02-05 MED ORDER — ASPIRIN 81 MG PO CHEW
81.0000 mg | CHEWABLE_TABLET | Freq: Every day | ORAL | Status: DC
  Administered 2017-02-06: 81 mg
  Filled 2017-02-05: qty 1

## 2017-02-05 MED ORDER — ASPIRIN EC 81 MG PO TBEC
81.0000 mg | DELAYED_RELEASE_TABLET | Freq: Every day | ORAL | Status: DC
  Administered 2017-02-05: 81 mg via ORAL
  Filled 2017-02-05: qty 1

## 2017-02-05 MED ORDER — DIGOXIN 0.25 MG/ML IJ SOLN
0.5000 mg | Freq: Once | INTRAMUSCULAR | Status: AC
  Administered 2017-02-05: 0.5 mg via INTRAVENOUS
  Filled 2017-02-05: qty 2

## 2017-02-05 MED FILL — Albumin, Human Inj 5%: INTRAVENOUS | Qty: 250 | Status: AC

## 2017-02-05 MED FILL — Lidocaine HCl IV Inj 20 MG/ML: INTRAVENOUS | Qty: 5 | Status: AC

## 2017-02-05 MED FILL — Sodium Bicarbonate IV Soln 8.4%: INTRAVENOUS | Qty: 50 | Status: AC

## 2017-02-05 MED FILL — Mannitol IV Soln 20%: INTRAVENOUS | Qty: 500 | Status: AC

## 2017-02-05 MED FILL — Sodium Chloride IV Soln 0.9%: INTRAVENOUS | Qty: 2000 | Status: AC

## 2017-02-05 MED FILL — Electrolyte-R (PH 7.4) Solution: INTRAVENOUS | Qty: 4000 | Status: AC

## 2017-02-05 MED FILL — Heparin Sodium (Porcine) Inj 1000 Unit/ML: INTRAMUSCULAR | Qty: 20 | Status: AC

## 2017-02-05 NOTE — Plan of Care (Signed)
Continue plan of care, nausea controled with zofran.

## 2017-02-05 NOTE — Plan of Care (Signed)
Pt remains slow to progress in relation to activity/ambulating tolerance.  Pt still has episodes of nausea.

## 2017-02-05 NOTE — Progress Notes (Signed)
OT Cancellation Note  Patient Details Name: [name] MRN: [id number] DOB: 06/19/1955   Cancelled Treatment:    Reason Eval/Treat Not Completed: Medical issues which prohibited therapy.  Pt with complaint of nausea  Lucille Passy, OTR/L [phone]   Lucille Passy M 02/05/2017, 3:37 PM

## 2017-02-05 NOTE — Progress Notes (Signed)
[facility] BRIEF SICU PROGRESS NOTE  6 Days Post-Op  S/P Procedure(s) (LRB): CORONARY ARTERY BYPASS GRAFTING (CABG), Times four  using the right saphaneous vein, harvested endoscopicly.  and left internal mammary artery . (N/A) TRANSESOPHAGEAL ECHOCARDIOGRAM (TEE) (N/A) MITRAL VALVE REPAIR (MVR) (N/A)   NSR w/ stable BP Breathing comfortably on room air  Plan: Continue current plan  Rexene Alberts, MD 02/05/2017 7:06 PM

## 2017-02-05 NOTE — Progress Notes (Signed)
CSW continuing to follow in case patient in need of SNF- need updated PT eval to determine if pt would qualify through her insurance- PT unable to assess today due to medical concerns  Jorge Ny, [place] Social Worker [phone]

## 2017-02-05 NOTE — Progress Notes (Signed)
PT Cancellation Note  Patient Details Name: [name] MRN: [id number] DOB: 1955-10-06   Cancelled Treatment:    Reason Eval/Treat Not Completed: Medical issues which prohibited therapy. Pt with increased nausea limiting mobility. Will follow-up for PT evaluation later today as time allows.  Mabeline Caras, PT, DPT Acute Rehab Services  Pager: [place] 02/05/2017, 10:15 AM

## 2017-02-05 NOTE — Progress Notes (Signed)
   No recurrence of atrial fibrillation.  Now on oral amiodarone but experiencing some nausea.  We should probably decrease his amiodarone down to 200 mg twice daily.

## 2017-02-06 ENCOUNTER — Encounter (HOSPITAL_COMMUNITY): Payer: Self-pay | Admitting: Surgery

## 2017-02-06 ENCOUNTER — Ambulatory Visit: Payer: BLUE CROSS/BLUE SHIELD | Admitting: Physician Assistant

## 2017-02-06 LAB — ECHO TEE
AO mean calculated velocity dopler: 75 cm/s
AV Area VTI index: 0.7 cm2/m2
AV Area VTI: 1.75 cm2
AV Area mean vel: 2.09 cm2
AV Mean grad: 3 mmHg
AV Peak grad: 7 mmHg
AV VEL mean LVOT/AV: 0.92
AV area mean vel ind: 0.94 cm2/m2
AV peak Index: 0.79
AV pk vel: 132 cm/s
AV vel: 1.54
Ao pk vel: 0.77 m/s
Ao-asc: 3.7 cm
LVOT MV VTI INDEX: 0.8 cm2/m2
LVOT MV VTI: 1.77
LVOT SV: 45 mL
LVOT VTI: 19.8 cm
LVOT area: 2.27 cm2
LVOT diameter: 17 mm
LVOT peak VTI: 0.68 cm
LVOT peak vel: 102 cm/s
MV Annulus VTI: 25.4 cm
MV M vel: 64.2
Mean grad: 1 mmHg
VTI: 29.2 cm
Valve area index: 0.7
Valve area: 1.54 cm2

## 2017-02-06 LAB — PROTIME-INR
INR: 1.91
Prothrombin Time: 21.7 seconds — ABNORMAL HIGH (ref 11.4–15.2)

## 2017-02-06 LAB — GLUCOSE, CAPILLARY
Glucose-Capillary: 103 mg/dL — ABNORMAL HIGH (ref 65–99)
Glucose-Capillary: 106 mg/dL — ABNORMAL HIGH (ref 65–99)

## 2017-02-06 MED ORDER — ASPIRIN EC 81 MG PO TBEC
81.0000 mg | DELAYED_RELEASE_TABLET | Freq: Every day | ORAL | Status: DC
  Administered 2017-02-07 – 2017-02-11 (×5): 81 mg via ORAL
  Filled 2017-02-06 (×5): qty 1

## 2017-02-06 MED ORDER — AMIODARONE IV BOLUS ONLY 150 MG/100ML: 150.0000 mg | Freq: Once | INTRAVENOUS | Status: DC

## 2017-02-06 MED ORDER — POTASSIUM CHLORIDE CRYS ER 20 MEQ PO TBCR
20.0000 meq | EXTENDED_RELEASE_TABLET | Freq: Two times a day (BID) | ORAL | Status: AC
  Administered 2017-02-06 – 2017-02-09 (×8): 20 meq via ORAL
  Filled 2017-02-06 (×8): qty 1

## 2017-02-06 MED ORDER — AMIODARONE IV BOLUS ONLY 150 MG/100ML
150.0000 mg | Freq: Once | INTRAVENOUS | Status: AC
  Administered 2017-02-06: 150 mg via INTRAVENOUS
  Filled 2017-02-06: qty 100

## 2017-02-06 MED ORDER — ACETAMINOPHEN 325 MG PO TABS
650.0000 mg | ORAL_TABLET | Freq: Four times a day (QID) | ORAL | Status: DC | PRN
  Administered 2017-02-06 – 2017-02-11 (×6): 650 mg via ORAL
  Filled 2017-02-06 (×6): qty 2

## 2017-02-06 MED ORDER — WARFARIN SODIUM 4 MG PO TABS
4.0000 mg | ORAL_TABLET | Freq: Every day | ORAL | Status: DC
  Administered 2017-02-06: 4 mg via ORAL
  Filled 2017-02-06: qty 1

## 2017-02-06 MED ORDER — METOPROLOL TARTRATE 12.5 MG HALF TABLET
12.5000 mg | ORAL_TABLET | Freq: Once | ORAL | Status: AC
  Administered 2017-02-06: 12.5 mg via ORAL
  Filled 2017-02-06: qty 1

## 2017-02-06 MED ORDER — SODIUM CHLORIDE 0.9% FLUSH: 3.0000 mL | INTRAVENOUS | Status: DC | PRN

## 2017-02-06 MED ORDER — FUROSEMIDE 40 MG PO TABS
40.0000 mg | ORAL_TABLET | Freq: Every day | ORAL | Status: AC
  Administered 2017-02-07 – 2017-02-09 (×3): 40 mg via ORAL
  Filled 2017-02-06 (×3): qty 1

## 2017-02-06 MED ORDER — SODIUM CHLORIDE 0.9% FLUSH
3.0000 mL | Freq: Two times a day (BID) | INTRAVENOUS | Status: DC
  Administered 2017-02-06 – 2017-02-10 (×7): 3 mL via INTRAVENOUS

## 2017-02-06 MED ORDER — ALPRAZOLAM 0.5 MG PO TABS
1.0000 mg | ORAL_TABLET | Freq: Once | ORAL | Status: AC
  Administered 2017-02-06: 1 mg via ORAL
  Filled 2017-02-06: qty 2

## 2017-02-06 MED ORDER — SODIUM CHLORIDE 0.9 % IV SOLN: 250.0000 mL | INTRAVENOUS | Status: DC | PRN

## 2017-02-06 MED ORDER — MOVING RIGHT ALONG BOOK
Freq: Once | Status: DC
  Filled 2017-02-06: qty 1

## 2017-02-06 NOTE — Evaluation (Signed)
Physical Therapy Evaluation Patient Details Name: [name] MRN: [id number] DOB: [birth year] Today's Date: 02/06/2017   History of Present Illness  Pt is a 61 y.o. female admitted on 01/23/17 with several week h/o jaw, neck, shoulder, and back pain with exertion; now s/p CABGx4 on 11/9. Pertinent PMH includes lupus, hypothyroidism, CAD s/p stenting (2003), ischemic cardiomyopathy, tobacco abuse.     Clinical Impression  Pt presents with an overall decrease in functional mobility secondary to above. PTA, pt indep and lives alone; will have support from [place] at discharge. Educ on sternal precautions, positioning, and importance of mobility. Today, pt able to transfer and amb with supervision for safety. Overall, pt is moving extremely well. Pt would benefit from continued acute PT services to maximize functional mobility and independence prior to d/c home.     Follow Up Recommendations No PT follow up;Supervision - Intermittent    Equipment Recommendations  Rolling walker with 5" wheels    Recommendations for Other Services       Precautions / Restrictions Precautions Precautions: Sternal Restrictions Weight Bearing Restrictions: No      Mobility  Bed Mobility Overal bed mobility: Needs Assistance Bed Mobility: Rolling;Sidelying to Sit;Sit to Sidelying Rolling: Supervision Sidelying to sit: Supervision     Sit to sidelying: Supervision General bed mobility comments: Educ on log roll technique to maintain sternal precautions  Transfers Overall transfer level: Needs assistance   Transfers: Sit to/from Stand Sit to Stand: Supervision         General transfer comment: Good technique standing with BLEs, not requiring BUEs to push  Ambulation/Gait Ambulation/Gait assistance: Supervision Ambulation Distance (Feet): 200 Feet Assistive device: Pushed wheelchair;None Gait Pattern/deviations: Step-through pattern;Trunk flexed Gait velocity:  Decreased Gait velocity interpretation: <1.8 ft/sec, indicative of risk for recurrent falls General Gait Details: Amb 54' with no AD, and an addition 200' pushing w/c for transfer to new floor. 1x seated rest break secondary to fatigue and dyspnea 2/4  Stairs            Wheelchair Mobility    Modified Rankin (Stroke Patients Only)       Balance Overall balance assessment: Needs assistance   Sitting balance-Leahy Scale: Good       Standing balance-Leahy Scale: Fair                               Pertinent Vitals/Pain Pain Assessment: Faces Faces Pain Scale: Hurts a little bit Pain Location: Sternal incision Pain Descriptors / Indicators: Discomfort;Sore Pain Intervention(s): Monitored during session    Home Living Family/patient expects to be discharged to:: Private residence Living Arrangements: Alone Available Help at Discharge: Family;Friend(s);Available 24 hours/day Type of Home: House Home Access: Level entry     Home Layout: Two level;Able to live on main level with bedroom/bathroom;Bed/bath upstairs Home Equipment: None Additional Comments: Has large church community who will be able to provide as much assist as needed    Prior Function Level of Independence: Independent               Hand Dominance   Dominant Hand: Right    Extremity/Trunk Assessment   Upper Extremity Assessment Upper Extremity Assessment: Overall WFL for tasks assessed    Lower Extremity Assessment Lower Extremity Assessment: Overall WFL for tasks assessed       Communication   Communication: No difficulties  Cognition Arousal/Alertness: Awake/alert Behavior During Therapy: WFL for tasks assessed/performed Overall Cognitive Status: Within  Functional Limits for tasks assessed                                        General Comments      Exercises     Assessment/Plan    PT Assessment Patient needs continued PT services  PT Problem  List Decreased activity tolerance;Decreased balance;Decreased mobility;Cardiopulmonary status limiting activity;Decreased knowledge of precautions       PT Treatment Interventions DME instruction;Gait training;Stair training;Functional mobility training;Therapeutic activities;Therapeutic exercise;Balance training;Patient/family education    PT Goals (Current goals can be found in the Care Plan section)  Acute Rehab PT Goals Patient Stated Goal: Return home PT Goal Formulation: With patient Time For Goal Achievement: 02/20/17 Potential to Achieve Goals: Good    Frequency Min 3X/week   Barriers to discharge        Co-evaluation PT/OT/SLP Co-Evaluation/Treatment: Yes Reason for Co-Treatment: To address functional/ADL transfers PT goals addressed during session: Mobility/safety with mobility;Balance         AM-PAC PT "6 Clicks" Daily Activity  Outcome Measure Difficulty turning over in bed (including adjusting bedclothes, sheets and blankets)?: None Difficulty moving from lying on back to sitting on the side of the bed? : None Difficulty sitting down on and standing up from a chair with arms (e.g., wheelchair, bedside commode, etc,.)?: None Help needed moving to and from a bed to chair (including a wheelchair)?: A Little Help needed walking in hospital room?: A Little Help needed climbing 3-5 steps with a railing? : A Little 6 Click Score: 21    End of Session   Activity Tolerance: Patient tolerated treatment well Patient left: in bed;with nursing/sitter in room;with call bell/phone within reach Nurse Communication: Mobility status PT Visit Diagnosis: Other abnormalities of gait and mobility (R26.89);Pain Pain - part of body: (sternum)    Time: 6195-0932 PT Time Calculation (min) (ACUTE ONLY): 24 min   Charges:   PT Evaluation $PT Eval Moderate Complexity: 1 Mod     PT G Codes:       Mabeline Caras, PT, DPT Acute Rehab Services  Pager: [place] 02/06/2017, 5:46 PM

## 2017-02-06 NOTE — Progress Notes (Signed)
Patient converted to [name]

## 2017-02-06 NOTE — Progress Notes (Signed)
Patient arrived on the unit from [place], on a wheelchair, assessment completed see flowsheet,placed on tele ccmd notified, patient oriented to room and staff, bed in lowest position, call bell in reach will continue to monitor

## 2017-02-06 NOTE — Discharge Instructions (Signed)
Coronary Artery Bypass Grafting, Care After °These instructions give you information on caring for yourself after your procedure. Your doctor may also give you more specific instructions. Call your doctor if you have any problems or questions after your procedure. °Follow these instructions at home: °· Only take medicine as told by your doctor. Take medicines exactly as told. Do not stop taking medicines or start any new medicines without talking to your doctor first. °· Take your pulse as told by your doctor. °· Do deep breathing as told by your doctor. Use your breathing device (incentive spirometer), if given, to practice deep breathing several times a day. Support your chest with a pillow or your arms when you take deep breaths or cough. °· Keep the area clean, dry, and protected where the surgery cuts (incisions) were made. Remove bandages (dressings) only as told by your doctor. If strips were applied to surgical area, do not take them off. They fall off on their own. °· Check the surgery area daily for puffiness (swelling), redness, or leaking fluid. °· If surgery cuts were made in your legs: °? Avoid crossing your legs. °? Avoid sitting for long periods of time. Change positions every 30 minutes. °? Raise your legs when you are sitting. Place them on pillows. °· Wear stockings that help keep blood clots from forming in your legs (compression stockings). °· Only take sponge baths until your doctor says it is okay to take showers. Pat the surgery area dry. Do not rub the surgery area with a washcloth or towel. Do not bathe, swim, or use a hot tub until your doctor says it is okay. °· Eat foods that are high in fiber. These include raw fruits and vegetables, whole grains, beans, and nuts. Choose lean meats. Avoid canned, processed, and fried foods. °· Drink enough fluids to keep your pee (urine) clear or pale yellow. °· Weigh yourself every day. °· Rest and limit activity as told by your doctor. You may be told  to: °? Stop any activity if you have chest pain, shortness of breath, changes in heartbeat, or dizziness. Get help right away if this happens. °? Move around often for short amounts of time or take short walks as told by your doctor. Gradually become more active. You may need help to strengthen your muscles and build endurance. °? Avoid lifting, pushing, or pulling anything heavier than 10 pounds (4.5 kg) for at least 6 weeks after surgery. °· Do not drive until your doctor says it is okay. °· Ask your doctor when you can go back to work. °· Ask your doctor when you can begin sexual activity again. °· Follow up with your doctor as told. °Contact a doctor if: °· You have puffiness, redness, more pain, or fluid draining from the incision site. °· You have a fever. °· You have puffiness in your ankles or legs. °· You have pain in your legs. °· You gain 2 or more pounds (0.9 kg) a day. °· You feel sick to your stomach (nauseous) or throw up (vomit). °· You have watery poop (diarrhea). °Get help right away if: °· You have chest pain that goes to your jaw or arms. °· You have shortness of breath. °· You have a fast or irregular heartbeat. °· You notice a "clicking" in your breastbone when you move. °· You have numbness or weakness in your arms or legs. °· You feel dizzy or light-headed. °This information is not intended to replace advice given to you by   your health care provider. Make sure you discuss any questions you have with your health care provider. Document Released: 03/15/2013 Document Revised: 08/16/2015 Document Reviewed: 08/17/2012 Elsevier Interactive Patient Education  2017 [employer]. ------------------------ Information on my medicine - Coumadin   (Warfarin)  This medication education was reviewed with me or my healthcare representative as part of my discharge preparation.  The pharmacist that spoke with me during my hospital stay was:  Dareen Piano, [facility]  Why was Coumadin prescribed for  you? Coumadin was prescribed for you because you have a blood clot or a medical condition that can cause an increased risk of forming blood clots. Blood clots can cause serious health problems by blocking the flow of blood to the heart, lung, or brain. Coumadin can prevent harmful blood clots from forming. As a reminder your indication for Coumadin is:   Blood Clot Prevention After Heart Valve Surgery  What test will check on my response to Coumadin? While on Coumadin (warfarin) you will need to have an INR test regularly to ensure that your dose is keeping you in the desired range. The INR (international normalized ratio) number is calculated from the result of the laboratory test called prothrombin time (PT).  If an INR APPOINTMENT HAS NOT ALREADY BEEN MADE FOR YOU please schedule an appointment to have this lab work done by your health care provider within 7 days. Your INR goal is usually a number between:  2 to 3 or your provider may give you a more narrow range like 2-2.5.  Ask your health care provider during an office visit what your goal INR is.  What  do you need to  know  About  COUMADIN? Take Coumadin (warfarin) exactly as prescribed by your healthcare provider about the same time each day.  DO NOT stop taking without talking to the doctor who prescribed the medication.  Stopping without other blood clot prevention medication to take the place of Coumadin may increase your risk of developing a new clot or stroke.  Get refills before you run out.  What do you do if you miss a dose? If you miss a dose, take it as soon as you remember on the same day then continue your regularly scheduled regimen the next day.  Do not take two doses of Coumadin at the same time.  Important Safety Information A possible side effect of Coumadin (Warfarin) is an increased risk of bleeding. You should call your healthcare provider right away if you experience any of the following: ? Bleeding from an injury or  your nose that does not stop. ? Unusual colored urine (red or dark brown) or unusual colored stools (red or black). ? Unusual bruising for unknown reasons. ? A serious fall or if you hit your head (even if there is no bleeding).  Some foods or medicines interact with Coumadin (warfarin) and might alter your response to warfarin. To help avoid this: ? Eat a balanced diet, maintaining a consistent amount of Vitamin K. ? Notify your provider about major diet changes you plan to make. ? Avoid alcohol or limit your intake to 1 drink for women and 2 drinks for men per day. (1 drink is 5 oz. wine, 12 oz. beer, or 1.5 oz. liquor.)  Make sure that ANY health care provider who prescribes medication for you knows that you are taking Coumadin (warfarin).  Also make sure the healthcare provider who is monitoring your Coumadin knows when you have started a new medication including  herbals and non-prescription products.  Coumadin (Warfarin)  Major Drug Interactions  Increased Warfarin Effect Decreased Warfarin Effect  Alcohol (large quantities) Antibiotics (esp. Septra/Bactrim, Flagyl, Cipro) Amiodarone (Cordarone) Aspirin (ASA) Cimetidine (Tagamet) Megestrol (Megace) NSAIDs (ibuprofen, naproxen, etc.) Piroxicam (Feldene) Propafenone (Rythmol SR) Propranolol (Inderal) Isoniazid (INH) Posaconazole (Noxafil) Barbiturates (Phenobarbital) Carbamazepine (Tegretol) Chlordiazepoxide (Librium) Cholestyramine (Questran) Griseofulvin Oral Contraceptives Rifampin Sucralfate (Carafate) Vitamin K   Coumadin (Warfarin) Major Herbal Interactions  Increased Warfarin Effect Decreased Warfarin Effect  Garlic Ginseng Ginkgo biloba Coenzyme Q10 Green tea St. Johns wort    Coumadin (Warfarin) FOOD Interactions  Eat a consistent number of servings per week of foods HIGH in Vitamin K (1 serving =  cup)  Collards (cooked, or boiled & drained) Kale (cooked, or boiled & drained) Mustard greens (cooked,  or boiled & drained) Parsley *serving size only =  cup Spinach (cooked, or boiled & drained) Swiss chard (cooked, or boiled & drained) Turnip greens (cooked, or boiled & drained)  Eat a consistent number of servings per week of foods MEDIUM-HIGH in Vitamin K (1 serving = 1 cup)  Asparagus (cooked, or boiled & drained) Broccoli (cooked, boiled & drained, or raw & chopped) Brussel sprouts (cooked, or boiled & drained) *serving size only =  cup Lettuce, raw (green leaf, endive, romaine) Spinach, raw Turnip greens, raw & chopped   These websites have more information on Coumadin (warfarin):  FailFactory.se; VeganReport.com.au;

## 2017-02-06 NOTE — Progress Notes (Signed)
CARDIOLOGY ROUNDING NOTE   Nearly 1 week post bypass surgery now with recurrent A. fib despite oral amiodarone.  We will give IV amiodarone 150 mg bolus.  Continue oral therapy.

## 2017-02-06 NOTE — Progress Notes (Signed)
Progress Note  Patient Name: [name] Date of Encounter: 02/06/2017  Primary Cardiologist: Angelena Form  Subjective   Still in atrial fibrillation, didn't sleep well because of palpitations, no new chest pain or difficulty breathing.   Inpatient Medications    Scheduled Meds: . amiodarone  200 mg Oral BID  . aspirin EC  81 mg Oral Daily   Or  . aspirin  81 mg Per Tube Daily  . azaTHIOprine  50 mg Oral BID  . calcium carbonate  1 tablet Oral BID WC  . digoxin  0.125 mg Oral Daily  . enoxaparin (LOVENOX) injection  40 mg Subcutaneous QHS  . fluticasone  2 spray Each Nare Daily  . [START ON 02/07/2017] furosemide  40 mg Oral Daily  . levothyroxine  75 mcg Oral QAC breakfast  . mouth rinse  15 mL Mouth Rinse BID  . methocarbamol  750 mg Oral QID  . midodrine  10 mg Oral TID WC  . pantoprazole  40 mg Oral Daily  . potassium chloride  20 mEq Oral BID  . rosuvastatin  40 mg Oral q1800  . sodium chloride flush  3 mL Intravenous Q12H  . warfarin  4 mg Oral q1800  . Warfarin - Physician Dosing Inpatient   Does not apply q1800   Continuous Infusions: . sodium chloride Stopped (01/31/17 1600)  . sodium chloride    . sodium chloride Stopped (01/31/17 1600)   PRN Meds: sodium chloride, ondansetron (ZOFRAN) IV, sodium chloride flush   Vital Signs    Vitals:   02/06/17 0409 02/06/17 0427 02/06/17 0800 02/06/17 0805  BP:   103/72 103/72  Pulse:  (!) 114  (!) 115  Resp:   (!) 23 (!) 22  Temp: 98.8 F (37.1 C)   99.5 F (37.5 C)  TempSrc: Oral   Oral  SpO2:   94% 92%  Weight: 256 lb 9.9 oz (116.4 kg)     Height: 5\' 7"  (1.702 m)       Intake/Output Summary (Last 24 hours) at 02/06/2017 1053 Last data filed at 02/06/2017 0830 Gross per 24 hour  Intake 220 ml  Output 250 ml  Net -30 ml   Filed Weights   02/04/17 0600 02/05/17 0500 02/06/17 0409  Weight: 257 lb 4.4 oz (116.7 kg) 257 lb 8 oz (116.8 kg) 256 lb 9.9 oz (116.4 kg)    Physical Exam   GEN: sitting up  in bed sleeping, No acute distress.   Neck: No JVD Cardiac: irregularly irregular, no murmurs, rubs, or gallops.  Respiratory: Clear to auscultation bilaterally. GI: Soft, nontender, non-distended  MS: No edema; No deformity. Neuro:  Nonfocal  Psych: Normal affect   Labs    Chemistry Recent Labs  Lab 02/03/17 0411 02/04/17 0542 02/05/17 0451  NA 136 137 137  K 4.0 3.4* 3.9  CL 106 106 107  CO2 22 23 22   GLUCOSE 108* 116* 106*  BUN 25* 19 15  CREATININE 1.32* 1.01* 0.97  CALCIUM 7.9* 8.0* 8.3*  GFRNONAA 43* 59* >60  GFRAA 49* >60 >60  ANIONGAP 8 8 8      Hematology Recent Labs  Lab 01/31/17 1617 01/31/17 1632 02/01/17 0302 02/02/17 0417  WBC 10.0  --  11.4* 11.1*  RBC 2.84*  --  2.88* 2.61*  HGB 9.7* 9.9* 9.7* 8.8*  HCT 28.4* 29.0* 28.9* 26.2*  MCV 100.0  --  100.3* 100.4*  MCH 34.2*  --  33.7 33.7  MCHC 34.2  --  33.6  33.6  RDW 15.2  --  15.5 15.4  PLT 152  --  143* 126*    Cardiac EnzymesNo results for input(s): TROPONINI in the last 168 hours. No results for input(s): TROPIPOC in the last 168 hours.   BNPNo results for input(s): BNP, PROBNP in the last 168 hours.   DDimer No results for input(s): DDIMER in the last 168 hours.   Radiology    No results found.  Cardiac Studies   No new data   Patient Profile     61 y.o. female with apast medical history of CAD (s/p BMS to RCA and LCx in 2003, PTCA to diagonal in 2003), ischemic cardiomyopathy (EF 45-50% by echo in 08/2014) moderate MR, HLD, GERD, hypothyroidism, SLE, and tobacco use. She is a former pt of Dr. Darlina Guys now followed by Dr. Angelena Form.Cath revealed severe three-vessel CAD with stent failure including total occlusion of the right coronary and high-grade ISR obstruction in the circumflex and de novo high-grade LAD with occlusion of the first diagonal. Underwent successful CABG on 01/30/17 with LIMA to LAD, SVG to OM 1 and 2, and SVG to PDA. Postop A. fib on 11/12/2018and converted with IV  amiodarone.  Assessment & Plan    Atrial fibrillation with RVR- Given bolus of IV amiodarone 150 mg again this morning, rate is controlled (110s) now on amio 200 mg BID and digoxin 0.125 mg daily. Stroke prophylaxis with warfarin.   Hypotension - blood pressure well controlled po midodrine 10 mg qd  CAD now s/p CABG 11/9 - stable, no chest pain or shortness of breath. On aspirin, rosuvastatin 40 mg.  For questions or updates, please contact [place] Please consult [email or web address] for contact info under Cardiology/STEMI.      Signed, Ledell Noss, MD  02/06/2017, 10:53 AM

## 2017-02-06 NOTE — Evaluation (Signed)
Occupational Therapy Evaluation Patient Details Name: [name] MRN: [id number] DOB: 1955/09/17 Today's Date: 02/06/2017    History of Present Illness Pt is a 61 y.o. female admitted on 01/23/17 with several week h/o jaw, neck, shoulder, and back pain with exertion; now s/p CABGx4 on 11/9. Pertinent PMH includes lupus, hypothyroidism, CAD s/p stenting (2003), ischemic cardiomyopathy, tobacco abuse.    Clinical Impression   Pt admitted with above. She demonstrates the below listed deficits and will benefit from continued OT to maximize safety and independence with BADLs.  Pt is doing very well and is able to perform ADLs with supervision.  She requires min cues for sternal precautions.  She lives alone, but has good support from friends and family and should return to mod I fairly quickly.  Will follow acutely.       Follow Up Recommendations  No OT follow up;Supervision - Intermittent    Equipment Recommendations  None recommended by OT    Recommendations for Other Services       Precautions / Restrictions Precautions Precautions: Sternal Precaution Comments: Pt requires min cues for sternal precautions  Restrictions Weight Bearing Restrictions: No      Mobility Bed Mobility Overal bed mobility: Needs Assistance Bed Mobility: Rolling;Sidelying to Sit;Sit to Sidelying Rolling: Supervision Sidelying to sit: Supervision     Sit to sidelying: Supervision General bed mobility comments: Educ on log roll technique to maintain sternal precautions  Transfers Overall transfer level: Needs assistance Equipment used: Pushed w/c;None Transfers: Sit to/from [facility] Sit to Stand: Supervision Stand pivot transfers: Supervision       General transfer comment: Good technique standing with BLEs, not requiring BUEs to push    Balance Overall balance assessment: Needs assistance   Sitting balance-Leahy Scale: Good       Standing balance-Leahy Scale:  Fair                             ADL either performed or assessed with clinical judgement   ADL Overall ADL's : Needs assistance/impaired Eating/Feeding: Independent   Grooming: Wash/dry hands;Wash/dry face;Oral care;Supervision/safety;Standing   Upper Body Bathing: Set up;Sitting   Lower Body Bathing: Supervison/ safety;Sit to/from stand   Upper Body Dressing : Set up;Sitting   Lower Body Dressing: Supervision/safety;Sit to/from stand   Toilet Transfer: Supervision/safety;Ambulation;Comfort height toilet   Toileting- Clothing Manipulation and Hygiene: Supervision/safety;Sit to/from stand       Functional mobility during ADLs: Supervision/safety       Vision         Perception     Praxis      Pertinent Vitals/Pain Pain Assessment: Faces Faces Pain Scale: Hurts a little bit Pain Location: Sternal incision Pain Descriptors / Indicators: Discomfort;Sore Pain Intervention(s): Monitored during session     Hand Dominance Right   Extremity/Trunk Assessment Upper Extremity Assessment Upper Extremity Assessment: Overall WFL for tasks assessed   Lower Extremity Assessment Lower Extremity Assessment: Overall WFL for tasks assessed   Cervical / Trunk Assessment Cervical / Trunk Assessment: Normal   Communication Communication Communication: No difficulties   Cognition Arousal/Alertness: Awake/alert Behavior During Therapy: WFL for tasks assessed/performed Overall Cognitive Status: Within Functional Limits for tasks assessed                                     General Comments       Exercises  Shoulder Instructions      Home Living Family/patient expects to be discharged to:: Private residence Living Arrangements: Alone Available Help at Discharge: Family;Friend(s);Available 24 hours/day Type of Home: House Home Access: Level entry     Home Layout: Two level;Able to live on main level with bedroom/bathroom;Bed/bath  upstairs Alternate Level Stairs-Number of Steps: 17 Alternate Level Stairs-Rails: Right;Can reach both;Left Bathroom Shower/Tub: Occupational psychologist: Standard     Home Equipment: None   Additional Comments: Has large church community who will be able to provide as much assist as needed      Prior Functioning/Environment Level of Independence: Independent        Comments: 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         OT Problem List: Decreased strength;Decreased activity tolerance;Decreased knowledge of use of DME or AE;Decreased safety awareness;Decreased knowledge of precautions;Cardiopulmonary status limiting activity;Pain      OT Treatment/Interventions: Self-care/ADL training;DME and/or AE instruction;Therapeutic activities;Patient/family education;Balance training    OT Goals(Current goals can be found in the care plan section) Acute Rehab OT Goals Patient Stated Goal: Return home OT Goal Formulation: With patient Time For Goal Achievement: 02/13/17 Potential to Achieve Goals: Good ADL Goals Additional ADL Goal #1: Pt will be independent with sternal precautions during ADLs and functional mobility  OT Frequency: Min 2X/week   Barriers to D/C:            Co-evaluation   Reason for Co-Treatment: To address functional/ADL transfers PT goals addressed during session: Mobility/safety with mobility;Balance        AM-PAC PT "6 Clicks" Daily Activity     Outcome Measure Help from another person eating meals?: None Help from another person taking care of personal grooming?: A Little Help from another person toileting, which includes using toliet, bedpan, or urinal?: A Little Help from another person bathing (including washing, rinsing, drying)?: A Little Help from another person to put on and taking off regular upper body clothing?: A Little Help from another person to put on and taking off regular lower body clothing?: A  Little 6 Click Score: 19   End of Session Nurse Communication: Mobility status  Activity Tolerance: Patient tolerated treatment well Patient left: in bed;with call bell/phone within reach;with nursing/sitter in room  OT Visit Diagnosis: Unsteadiness on feet (R26.81)                Time: 9767-3419 OT Time Calculation (min): 26 min Charges:  OT General Charges $OT Visit: 1 Visit OT Evaluation $OT Eval Moderate Complexity: 1 Mod G-Codes:     [facility], OTR/L [phone]   Lucille Passy M 02/06/2017, 6:55 PM

## 2017-02-06 NOTE — Progress Notes (Signed)
Patient call c/o feeling anxious and can feel palpations in chest and in neck and wanting something to help with the Anxiety and At.Fib. Page Dr. Lucianne Lei tight  Awaiting return call.

## 2017-02-06 NOTE — Anesthesia Postprocedure Evaluation (Signed)
Anesthesia Post Note  Patient: [name]  Procedure(s) Performed: CORONARY ARTERY BYPASS GRAFTING (CABG), Times four  using the right saphaneous vein, harvested endoscopicly.  and left internal mammary artery . (N/A Chest) TRANSESOPHAGEAL ECHOCARDIOGRAM (TEE) (N/A ) MITRAL VALVE REPAIR (MVR) (N/A Chest)     Patient location during evaluation: ICU Anesthesia Type: General Level of consciousness: sedated Pain management: pain level controlled Vital Signs Assessment: post-procedure vital signs reviewed and stable Respiratory status: patient remains intubated per anesthesia plan Cardiovascular status: stable Postop Assessment: no apparent nausea or vomiting Anesthetic complications: no    Last Vitals:  Vitals:   02/06/17 1653 02/06/17 2045  BP: (!) 135/54 (!) 116/105  Pulse: 79   Resp: 16 18  Temp: 37.3 C 37.7 C  SpO2: 95%     Last Pain:  Vitals:   02/06/17 2143  TempSrc:   PainSc: 0-No pain                 Blain Hunsucker

## 2017-02-06 NOTE — Progress Notes (Signed)
7 Days Post-Op Procedure(s) (LRB): CORONARY ARTERY BYPASS GRAFTING (CABG), Times four  using the right saphaneous vein, harvested endoscopicly.  and left internal mammary artery . (N/A) TRANSESOPHAGEAL ECHOCARDIOGRAM (TEE) (N/A) MITRAL VALVE REPAIR (MVR) (N/A) Subjective:  Had atrial fib overnight and did not sleep much. Says she was up walking around the ICU at 3 am. Minimal pain. Nausea resolved.  Objective: Vital signs in last 24 hours: Temp:  [98.1 F (36.7 C)-99.5 F (37.5 C)] 99.5 F (37.5 C) (11/16 0805) Pulse Rate:  [79-115] 115 (11/16 0805) Cardiac Rhythm: Atrial fibrillation (11/16 0400) Resp:  [15-27] 22 (11/16 0805) BP: (103-152)/(67-88) 103/72 (11/16 0805) SpO2:  [92 %-99 %] 92 % (11/16 0805) Weight:  [116.4 kg (256 lb 9.9 oz)] 116.4 kg (256 lb 9.9 oz) (11/16 0409)  Hemodynamic parameters for last 24 hours:    Intake/Output from previous day: 11/15 0701 - 11/16 0700 In: 243 [P.O.:240; I.V.:3] Out: 600 [Urine:600] Intake/Output this shift: No intake/output data recorded.  General appearance: alert and cooperative Heart: irregularly irregular rhythm and no murmur Lungs: clear to auscultation bilaterally Abdomen: soft, non-tender; bowel sounds normal; no masses,  no organomegaly Extremities: edema mild Wound: incisions ok  Lab Results: No results for input(s): WBC, HGB, HCT, PLT in the last 72 hours. BMET:  Recent Labs    02/04/17 0542 02/05/17 0451  NA 137 137  K 3.4* 3.9  CL 106 107  CO2 23 22  GLUCOSE 116* 106*  BUN 19 15  CREATININE 1.01* 0.97  CALCIUM 8.0* 8.3*    PT/INR:  Recent Labs    02/06/17 0302  LABPROT 21.7*  INR 1.91   ABG    Component Value Date/Time   PHART 7.371 01/30/2017 2321   HCO3 22.0 01/30/2017 2321   TCO2 23 01/31/2017 1632   ACIDBASEDEF 3.0 (H) 01/30/2017 2321   O2SAT 98.0 01/30/2017 2321   CBG (last 3)  Recent Labs    02/05/17 1241 02/05/17 1639 02/05/17 2149  GLUCAP 86 88 110*    Assessment/Plan: S/P  Procedure(s) (LRB): CORONARY ARTERY BYPASS GRAFTING (CABG), Times four  using the right saphaneous vein, harvested endoscopicly.  and left internal mammary artery . (N/A) TRANSESOPHAGEAL ECHOCARDIOGRAM (TEE) (N/A) MITRAL VALVE REPAIR (MVR) (N/A)  She has been hemodynamically stable on Midodrine but BP not high enough to start beta blocker.  Postop atrial fibrillation: recurrent on oral amio and digoxin. Bolus ordered this am. Rate is not that fast.   Check digoxin level in am with labs  Mild volume excess: wt is about 10 lbs over preop if accurate. Will continue oral diuretic for a few more days.  INR reaching therapeutic. Goal 2-2.5. Will decrease dose to 4 mg daily for now.  Continue IS and ambulation  Transfer to 4E.   LOS: 14 days    Gaye Pollack 02/06/2017

## 2017-02-06 NOTE — Progress Notes (Signed)
Pt's HR currently in afib.  12.5 Lopressor given per cardiology. Pt asymptomatic and resting comfortably. VSS. Will continue to monitor.

## 2017-02-06 NOTE — Addendum Note (Signed)
Addendum  created 02/06/17 2329 by Oleta Mouse, MD   Diagnosis association updated, Sign clinical note, SmartForm saved

## 2017-02-06 NOTE — Discharge Summary (Signed)
Physician Discharge Summary       [address]       [place] [address]             [phone]    Patient ID: [name] MRN: [id number] DOB/AGE: [birth year] 61 y.o.  Admit date: 01/23/2017 Discharge date: 02/11/2017  Admission Diagnoses: 1. Angina pectoris ([place]) 2. History oronary artery disease-s/p PCI, stents 3. Moderate to severe mitral regurgitation  Active Diagnoses:  1. Myocardial infarction ([place]) 2. Systemic lupus erythematosus with focal and segmental proliferative glomerulonephritis ([place]) 3. Hypothyroidism 4. Immune thrombocytopenic purpura ([place]) 5. Peripheral vascular disease, unspecified ([place]) 6. Esophageal reflux 7. A fib 8. ABL anemia 9. Thrombocytopenia   Procedure (s):  LEFT HEART CATH AND CORONARY ANGIOGRAPHY by Dr. Tamala Julian on 01/23/2017:  Conclusion    Severe three-vessel coronary disease with chronic total occlusion of the right coronary due to in-stent restenosis, 85% de novo stenosis in the proximal LAD, total occlusion of the large first diagonal, 70% proximal margin restenosis of mid circumflex stent and 80% stenosis in the first obtuse marginal which is crossed by the stent.  Severe inferior wall hypokinesis.  EF 45-50%.  Elevated LVEDP consistent with chronic combined systolic and diastolic heart failure  RECOMMENDATIONS:   The patient has been having pain at rest.  I will admit her to the hospital, start IV heparin, titrate therapy for blood pressure control, and get an in-hospital consultation for consideration of surgery.    1. Median Sternotomy 2. Extracorporeal circulation 3.   Coronary artery bypass grafting x 4   Left internal mammary graft to the LAD  Sequential SVG to OM1 and OM2  SVG to PDA  4.   Endoscopic vein harvest from the right leg 5.   Mitral valve annuloplasty using a 26 mm Sorin 3D Memo Ring by Dr. Cyndia Bent on 01/30/2017.   History of Presenting Illness: The patient is a 61 year old woman  with Lupus, hypothyroidism, ITP s/p splenectomy and CAD s/p stenting of the mid LCX and mid RCA in 2003, ischemic cardiomyopathy with EF of 45-50% by echo in 08/2014, moderate MR and tobacco abuse. She presents with a several week history of pain in jaw, neck, across shoulders and back and down both arms with exertion but also at rest. She has been fatigued and has had SOB with exertion. Cath shows severe 3-vessel CAD with chronic total occlusion of the RCA due to in-stent restenosis, 85% proximal LAD stenosis, occlusion of the large D1, 50% mid LAD and 90% distal LAD. The LCX has 70% stenosis in the proximal portion of the stent at the takeoff of the OM which has 85% stenosis at its ostium. There are faint collaterals to the distal RCA. LV gram was poor but EF estimated at 45-50%. No right heart cath done. LVEDP is 23 mm Hg.  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 Just recently started a new job.  This 61 year old woman has severe 3-vessel coronary artery disease with mild left ventricular dysfunction and a history of moderate MR by echo, last 07/2016. She now presents with unstable anginal symptoms. She has diffusely diseased and relatively small vessels but Dr. Cyndia Bent thought CABG would be the best treatment for her. She will need an echo to reassess LV function and the MR.  It showed LVEF 55-60%, there was malcoaptation of the valve leaflets, no evidence of vegetation, and severe mitral and tricuspid regurgitation.  TEE was then done 11/06  showed LVEF 55%, mild to moderate TR, mild AI, and moderate central mitral regurgitation due to malcoaptation. Dr. Cyndia Bent discussed the need for coronary artery bypass grafting surgery and mitral valve repair vs replacement. Potential risks, benefits, and complications of the surgery were discussed with the patient and she agreed to proceed with surgery. Pre operative ABIs showed no significant internal carotid artery stenosis bilaterally. Patient  then underwent a CABG x 4 and mitral valve repair on 01/30/2017.  Brief Hospital Course:  The patient was extubated the evening of surgery without difficulty. He/she remained afebrile and hemodynamically stable. She was initially AAI paced. She was weaned off of Dopamine drip. Gordy Councilman, a line, chest tubes, and foley were removed early in the post operative course. Lopressor was started. She went into a fib with RVR and was put on IV Amiodarone. She was started on Coumadin for mitral valve repair. Her PT and INR were monitored daily. She is on 2.5 mg of Coumadin and her latest INR is 2.2. She was volume over loaded and diuresed. He had ABL anemia.  Last H and H was 8/25. She later developed hypotension and was put on a Neo Synephrine drip. Lopressor was stopped. She was then started on Midodrine.  She was then started on Digoxin fo ra fib.She was weaned off the insulin drip.    The patient's glucose remained well controlled.The patient's HGA1C pre op was  5.6. The patient was felt surgically stable for transfer from the ICU to PCTU for further convalescence . She continues to progress with cardiac rehab. She was ambulating on room air. She has been tolerating a diet and has had a bowel movement. Epicardial pacing wires were removed .Chest tube sutures will be removed the day of discharge. The patient is felt surgically stable for discharge today.   Latest Vital Signs: Blood pressure 109/83, pulse (!) 105, temperature 98.8 F (37.1 C), temperature source Oral, resp. rate (!) 24, height 5\' 7"  (1.702 m), weight 246 lb 6.4 oz (111.8 kg), SpO2 94 %.  Physical Exam: General appearance: alert and cooperative Heart: irregularly irregular rhythm and no murmur Lungs: clear to auscultation bilaterally Abdomen: soft, non-tender; bowel sounds normal; no masses,  no organomegaly Extremities: edema mild Wound: incisions ok  Discharge Condition:Stable and discharged tohome Recent laboratory studies:  Lab  Results  Component Value Date   WBC 7.6 02/10/2017   HGB 8.4 (L) 02/10/2017   HCT 25.4 (L) 02/10/2017   MCV 100.4 (H) 02/10/2017   PLT 362 02/10/2017   Lab Results  Component Value Date   NA 137 02/07/2017   K 4.2 02/07/2017   CL 106 02/07/2017   CO2 24 02/07/2017   CREATININE 1.04 (H) 02/07/2017   GLUCOSE 90 02/07/2017    Diagnostic Studies: Dg Chest 2 View  Result Date: 01/17/2017 CLINICAL DATA:  Chest pain. EXAM: CHEST  2 VIEW COMPARISON:  Chest x-ray dated March 08, 2016. FINDINGS: Mild cardiomegaly. Normal pulmonary vascularity. No focal consolidation, pleural effusion, or pneumothorax. No acute osseous abnormality. IMPRESSION: Mild cardiomegaly.  No active cardiopulmonary disease. Electronically Signed   By: Titus Dubin M.D.   On: 01/17/2017 09:02   Dg Chest Port 1 View  Result Date: 02/07/2017 CLINICAL DATA:  Cough, shortness of breath, chest pain EXAM: PORTABLE CHEST 1 VIEW COMPARISON:  02/02/2017 FINDINGS: Cardiomegaly, prior CABG. Small left pleural effusion with left lower lobe atelectasis or infiltrate. Mild vascular congestion and small right effusion. IMPRESSION: Cardiomegaly with vascular congestion. Small bilateral pleural effusions. Left lower  lobe atelectasis or infiltrate. Electronically Signed   By: Rolm Baptise M.D.   On: 02/07/2017 11:02   Dg Chest Port 1 View  Result Date: 02/02/2017 CLINICAL DATA:  Atelectasis.  Follow-up cardiac surgery. EXAM: PORTABLE CHEST 1 VIEW COMPARISON:  Portable chest x-ray of February 01, 2017 FINDINGS: The cardiopericardial silhouette remains enlarged. The central pulmonary vascularity is mildly prominent and slightly increased interstitial edema is observed today. There is a small amount of pleural fluid on the right. On the left the retrocardiac region remains dense. There is a prosthetic valve in the mitral position. The sternal wires are intact. The right internal jugular Cordis sheath tip projects over the proximal SVC.  There are calcifications in the wall of the aortic arch. IMPRESSION: Mild CHF. Small right pleural effusion. No pneumothorax. Persistent left lower lobe atelectasis or pneumonia. Thoracic aortic atherosclerosis. Electronically Signed   By: David  Martinique M.D.   On: 02/02/2017 07:29   Dg Chest Port 1 View  Result Date: 02/01/2017 CLINICAL DATA:  Atelectasis EXAM: PORTABLE CHEST 1 VIEW COMPARISON:  01/31/2017 FINDINGS: Interval removal of Swan-Ganz catheter. Sheath remains in place. Pericardial and left chest tubes removed. No pneumothorax. Mild left lower lobe atelectasis unchanged. Negative for edema. Right lung clear. IMPRESSION: Left chest tube removed.  No pneumothorax. Left lower lobe atelectasis unchanged. Electronically Signed   By: Franchot Gallo M.D.   On: 02/01/2017 06:49   Dg Chest Port 1 View  Result Date: 01/31/2017 CLINICAL DATA:  Status post cardiac surgery. EXAM: PORTABLE CHEST 1 VIEW COMPARISON:  01/30/2017 and older exams. FINDINGS: Cardiac silhouette is mildly enlarged.  No mediastinal widening. Mild opacity at the left lung base consistent with atelectasis. Lungs are otherwise clear. The endotracheal tube and nasogastric tube have been removed since the prior exam. Right internal jugular Swan-Ganz catheter tip projects in the pulmonary outflow tract, no longer coiled. Mediastinal tube and upper and lower left thorax chest tubes are stable. No pneumothorax. IMPRESSION: 1. No acute findings or evidence of an operative complication. 2. Mild left lung base opacity consistent with atelectasis. No evidence of pulmonary edema. No pneumothorax. 3. Remaining support apparatus is well positioned. Electronically Signed   By: Lajean Manes M.D.   On: 01/31/2017 07:11   Dg Chest Port 1 View  Result Date: 01/30/2017 CLINICAL DATA:  Status post coronary bypass grafting EXAM: PORTABLE CHEST 1 VIEW COMPARISON:  01/17/2017 FINDINGS: Cardiac shadow remains enlarged. Postsurgical changes are now seen.  Endotracheal tube, nasogastric catheter, mediastinal drain, pericardial drain and left thoracostomy tube are all noted in satisfactory position. A Swan-Ganz catheter is noted from the right jugular approach coiled within the pulmonary outflow tract. Repositioning should be considered. The lungs are clear without pneumothorax or focal infiltrate. No bony abnormality is seen. IMPRESSION: Tubes and lines as described above. The Swan-Ganz catheter is coiled within the pulmonary outflow tract and should be repositioned as necessary. No acute postsurgical abnormality is noted. Electronically Signed   By: Inez Catalina M.D.   On: 01/30/2017 15:52   Ct Angio Chest/abd/pel For Dissection W And/or W/wo  Result Date: 01/17/2017 CLINICAL DATA:  Per pt, states face, neck, shoulder and chest pain that started 2 days ago-states history of MI-states dizzy and nauseated. History of Lupus EXAM: CT ANGIOGRAPHY CHEST, ABDOMEN AND PELVIS TECHNIQUE: Multidetector CT imaging through the chest, abdomen and pelvis was performed using the standard protocol during bolus administration of intravenous contrast. Multiplanar reconstructed images and MIPs were obtained and reviewed to evaluate the vascular anatomy.  CONTRAST:  100 cc Isovue 370 COMPARISON:  CT abdomen dated 03/09/2016. CT chest dated 05/28/2005. FINDINGS: CTA CHEST FINDINGS Cardiovascular: Mild aortic atherosclerosis. No thoracic aortic aneurysm or dissection. No evidence of ulcerated plaque. Heart size is within normal limits. No pericardial effusion. No central obstructing pulmonary embolism seen. Mediastinum/Nodes: Esophagus is unremarkable. No mass or enlarged lymph nodes seen within the mediastinum or perihilar regions. Trachea and central bronchi are unremarkable. Lungs/Pleura: Stable scarring/fibrosis at the right lung apex. Mild scarring/atelectasis at each lung base. At least some degree of air trapping bilaterally. No evidence of pneumonia. No pleural effusion or  pneumothorax. Musculoskeletal: No acute or suspicious osseous finding. No significant degenerative change appreciated within the thoracic spine. Review of the MIP images confirms the above findings. CTA ABDOMEN AND PELVIS FINDINGS VASCULAR Aorta: Aortic atherosclerosis.  No aortic aneurysm or dissection. Celiac: Patent without evidence of aneurysm, dissection, vasculitis or significant stenosis. SMA: Approximately 50% narrowing at the origin of the SMA, most likely related to noncalcified atherosclerotic plaque at the origin, less likely focal eccentric dissection. Normal flow within the distal SMA. Renals: Both renal arteries are patent without evidence of aneurysm, dissection, vasculitis, fibromuscular dysplasia or significant stenosis. IMA: Patent without evidence of aneurysm, dissection, vasculitis or significant stenosis. Inflow: Patent without evidence of aneurysm, dissection, vasculitis or significant stenosis. Veins: No obvious venous abnormality within the limitations of this arterial phase study. Review of the MIP images confirms the above findings. NON-VASCULAR Hepatobiliary: No focal liver abnormality is seen. Status post cholecystectomy. No biliary dilatation. Pancreas: Unremarkable. No pancreatic ductal dilatation or surrounding inflammatory changes. Spleen: Status post splenectomy. Small residual splenic remnant within the left upper quadrant. Adrenals/Urinary Tract: Adrenal glands appear normal. Kidneys are unremarkable without mass, stone or hydronephrosis. No renal or ureteral calculi. Bladder appears normal, partially decompressed. Stomach/Bowel: Bowel is normal in caliber. No bowel wall thickening or evidence of bowel wall inflammation seen. Appendix is normal. Stomach appears normal. Lymphatic: No significant vascular findings are present. No enlarged abdominal or pelvic lymph nodes. Reproductive: Uterus and bilateral adnexa are unremarkable. Other: No free fluid or abscess collection. No free  intraperitoneal air. Musculoskeletal: Mild degenerative change within the lumbar spine. No acute or suspicious osseous finding. Small left inguinal hernia which contains fat only. Superficial soft tissues are otherwise unremarkable. Review of the MIP images confirms the above findings. IMPRESSION: 1. No acute intrathoracic abnormality. No thoracic aortic aneurysm or dissection. Heart size is normal. No pericardial effusion. No pneumonia or pulmonary edema. 2. Approximately 50% narrowing at the origin of the superior mesenteric artery (SMA), most likely due to chronic noncalcified atherosclerotic plaque at the origin, focal eccentric dissection is considered much less likely. Normal contrast flow is seen into the mid and distal SMA. Consider vascular surgery consultation for further workup and/or follow-up considerations. No other significant vascular findings. No vascular obstruction or convincing dissection. 3. No acute appearing findings within the abdomen or pelvis. Chronic/incidental findings detailed above. 4. Aortic atherosclerosis. Electronically Signed   By: Franki Cabot M.D.   On: 01/17/2017 10:34   Discharge Instructions    Amb Referral to Cardiac Rehabilitation   Complete by:  As directed    Diagnosis:   CABG Valve Repair     Valve:  Mitral   CABG X ___:  4   Discharge patient   Complete by:  As directed    Discharge disposition:  01-Home or Self Care   Discharge patient date:  02/11/2017      Discharge Medications: Allergies as  of 02/11/2017   No Known Allergies     Medication List    STOP taking these medications   acetaminophen 500 MG tablet Commonly known as:  TYLENOL   atenolol 25 MG tablet Commonly known as:  TENORMIN   hydroxychloroquine 200 MG tablet Commonly known as:  PLAQUENIL   losartan 25 MG tablet Commonly known as:  COZAAR   methocarbamol 750 MG tablet Commonly known as:  ROBAXIN-750   nitroGLYCERIN 0.4 MG SL tablet Commonly known as:  NITROSTAT       TAKE these medications   acetaminophen-codeine 300-30 MG tablet Commonly known as:  TYLENOL #3 Take 1 tablet by mouth every 8 (eight) hours as needed for moderate pain.   amiodarone 200 MG tablet Commonly known as:  PACERONE Take 1 tablet (200 mg total) by mouth 2 (two) times daily.   aspirin 81 MG chewable tablet Chew 81 mg by mouth daily.   azaTHIOprine 50 MG tablet Commonly known as:  IMURAN Take 50 mg by mouth 2 (two) times daily.   calcium carbonate 500 MG chewable tablet Commonly known as:  TUMS - dosed in mg elemental calcium Chew 1 tablet by mouth 2 (two) times daily with a meal.   digoxin 0.25 MG tablet Commonly known as:  LANOXIN Take 1 tablet (0.25 mg total) by mouth daily.   ferrous gluconate 324 MG tablet Commonly known as:  FERGON Take 1 tablet (324 mg total) by mouth daily with breakfast.   fluticasone 50 MCG/ACT nasal spray Commonly known as:  FLONASE Place 2 sprays into both nostrils daily.   levothyroxine 75 MCG tablet Commonly known as:  SYNTHROID, LEVOTHROID TAKE 1 TABLET(50 MCG) BY MOUTH DAILY What changed:  medication strength   midodrine 5 MG tablet Commonly known as:  PROAMATINE Take 1 tablet (5 mg total) by mouth 3 (three) times daily with meals.   pantoprazole 40 MG tablet Commonly known as:  PROTONIX TAKE 1 TABLET(40 MG) BY MOUTH DAILY   rosuvastatin 20 MG tablet Commonly known as:  CRESTOR Take 1 tablet (20 mg total) by mouth daily.   warfarin 2.5 MG tablet Commonly known as:  COUMADIN Take 1 tablet (2.5 mg total) by mouth daily at 6 PM. As directed by coumadin clinic            Durable Medical Equipment  (From admission, onward)        Start     Ordered   02/10/17 1047  For home use only DME Walker rolling  Once    Comments:  S/p CABG  Question:  Patient needs a walker to treat with the following condition  Answer:  Weakness   02/10/17 1046      Follow Up Appointments: Follow-up Information    Gaye Pollack,  MD. Go on 03/11/2017.   Specialty:  Cardiothoracic Surgery Why:  PA/LAT CXR to be taken (at [facility] which is in the same building as Dr. Vivi Martens office) on 03/18/2017 at 11:30 am;Appointment time is at 12:00 pm Contact information: [place] [address] [phone]        Consuelo Pandy, PA-C. Go on 02/20/2017.   Specialties:  Cardiology, Radiology Why:  Appointment time is at 11:00 am Contact information: [address] [place] [address] [place] Follow up.   Why:  rolling walker arranged- to be delivered to room prior to discharge.  Contact  information: [place] [address] [phone]        [facility] Office Follow up.   Specialty:  Cardiology Why:  weds november 28 at  10:30 am  for pt/inr blood test to adjust coumadin dose Contact information: [address] [place]          Signed: Gaspar Bidding 02/11/2017, 8:57 AM

## 2017-02-07 ENCOUNTER — Inpatient Hospital Stay (HOSPITAL_COMMUNITY): Payer: BLUE CROSS/BLUE SHIELD

## 2017-02-07 LAB — CBC
HCT: 24.2 % — ABNORMAL LOW (ref 36.0–46.0)
Hemoglobin: 8.2 g/dL — ABNORMAL LOW (ref 12.0–15.0)
MCH: 33.3 pg (ref 26.0–34.0)
MCHC: 33.9 g/dL (ref 30.0–36.0)
MCV: 98.4 fL (ref 78.0–100.0)
Platelets: 281 10*3/uL (ref 150–400)
RBC: 2.46 MIL/uL — ABNORMAL LOW (ref 3.87–5.11)
RDW: 15.6 % — ABNORMAL HIGH (ref 11.5–15.5)
WBC: 7.7 10*3/uL (ref 4.0–10.5)

## 2017-02-07 LAB — PROTIME-INR
INR: 1.73
Prothrombin Time: 20.1 seconds — ABNORMAL HIGH (ref 11.4–15.2)

## 2017-02-07 LAB — BASIC METABOLIC PANEL
Anion gap: 7 (ref 5–15)
BUN: 12 mg/dL (ref 6–20)
CO2: 24 mmol/L (ref 22–32)
Calcium: 8.4 mg/dL — ABNORMAL LOW (ref 8.9–10.3)
Chloride: 106 mmol/L (ref 101–111)
Creatinine, Ser: 1.04 mg/dL — ABNORMAL HIGH (ref 0.44–1.00)
GFR calc Af Amer: >60 mL/min (ref >60)
GFR calc non Af Amer: 57 mL/min — ABNORMAL LOW (ref >60)
Glucose, Bld: 90 mg/dL (ref 65–99)
Potassium: 4.2 mmol/L (ref 3.5–5.1)
Sodium: 137 mmol/L (ref 135–145)

## 2017-02-07 LAB — DIGOXIN LEVEL: Digoxin Level: 0.4 ng/mL — ABNORMAL LOW (ref 0.8–2.0)

## 2017-02-07 MED ORDER — AMIODARONE LOAD VIA INFUSION: 150.0000 mg | Freq: Once | INTRAVENOUS | Status: DC

## 2017-02-07 MED ORDER — AMIODARONE IV BOLUS ONLY 150 MG/100ML
150.0000 mg | Freq: Once | INTRAVENOUS | Status: AC
  Administered 2017-02-07: 150 mg via INTRAVENOUS
  Filled 2017-02-07: qty 100

## 2017-02-07 MED ORDER — WARFARIN SODIUM 5 MG PO TABS
5.0000 mg | ORAL_TABLET | Freq: Every day | ORAL | Status: DC
  Administered 2017-02-07 – 2017-02-09 (×3): 5 mg via ORAL
  Filled 2017-02-07 (×3): qty 1

## 2017-02-07 MED ORDER — ACETAMINOPHEN-CODEINE #2 300-15 MG PO TABS
1.0000 | ORAL_TABLET | Freq: Four times a day (QID) | ORAL | Status: DC | PRN
Start: 2017-02-07 — End: 2017-02-07

## 2017-02-07 MED ORDER — ACETAMINOPHEN-CODEINE #3 300-30 MG PO TABS
1.0000 | ORAL_TABLET | Freq: Four times a day (QID) | ORAL | Status: DC | PRN
  Administered 2017-02-08: 1 via ORAL
  Filled 2017-02-07 (×2): qty 1

## 2017-02-07 MED ORDER — HYDROCODONE-ACETAMINOPHEN 5-325 MG PO TABS
1.0000 | ORAL_TABLET | ORAL | Status: DC | PRN
  Administered 2017-02-09: 2 via ORAL
  Administered 2017-02-10 – 2017-02-11 (×3): 1 via ORAL
  Filled 2017-02-07 (×3): qty 1
  Filled 2017-02-07: qty 2

## 2017-02-07 MED ORDER — ALPRAZOLAM 0.5 MG PO TABS
1.0000 mg | ORAL_TABLET | Freq: Every day | ORAL | Status: DC
  Administered 2017-02-07 – 2017-02-10 (×4): 1 mg via ORAL
  Filled 2017-02-07 (×4): qty 2

## 2017-02-07 NOTE — Progress Notes (Addendum)
CARDIAC REHAB PHASE I   PRE:  Rate/Rhythm: 81 SR  BP:  Sitting: 117/75      SaO2: 95% RA  MODE:  Ambulation: 430 ft   POST:  Rate/Rhythm: 107 ST  BP:  Sitting: 131/74      SaO2: 100% RA  Pt in recliner and willing to walk. Pt moving well around room. Pt ambulated 430 ft with steady gait. Rollator was used for assistance. Pt denied any complaints of CP or SOB. Pt returned to recliner with feet elevated. Call bell within reach. Pt expressed interest in [facility] after participating in the past in 2003. [facility] brochure was given to pt.  Will send referral to [facility].   [phone]   Carma Lair MS, ACSM CEP  1:55 PM 02/07/2017

## 2017-02-07 NOTE — Progress Notes (Addendum)
[address]       ,[place] [address]             [phone]      8 Days Post-Op Procedure(s) (LRB): CORONARY ARTERY BYPASS GRAFTING (CABG), Times four  using the right saphaneous vein, harvested endoscopicly.  and left internal mammary artery . (N/A) TRANSESOPHAGEAL ECHOCARDIOGRAM (TEE) (N/A) MITRAL VALVE REPAIR (MVR) (N/A) Subjective: She is having a lot of pain this morning and wants stronger pain medication. She says the xanax helps her sleep at night.   Objective: Vital signs in last 24 hours: Temp:  [97.4 F (36.3 C)-99.9 F (37.7 C)] 97.4 F (36.3 C) (11/17 0836) Pulse Rate:  [76-111] 111 (11/17 0837) Cardiac Rhythm: Atrial fibrillation;Atrial flutter (11/17 0822) Resp:  [16-32] 28 (11/17 0836) BP: (98-135)/(54-105) 98/87 (11/17 0836) SpO2:  [93 %-96 %] 93 % (11/17 0836) Weight:  [255 lb 3.2 oz (115.8 kg)-257 lb 3.2 oz (116.7 kg)] 255 lb 3.2 oz (115.8 kg) (11/17 0412)     Intake/Output from previous day: 11/16 0701 - 11/17 0700 In: 700 [P.O.:600; I.V.:100] Out: 350 [Urine:350] Intake/Output this shift: No intake/output data recorded.  General appearance: alert, cooperative and no distress Heart: irregularly irregular rhythm Lungs: clear to auscultation bilaterally Abdomen: soft, non-tender; bowel sounds normal; no masses,  no organomegaly Extremities: 1+ nonpitting pedal edema bilaterally Wound: clean and dry  Lab Results: Recent Labs    02/07/17 0409  WBC 7.7  HGB 8.2*  HCT 24.2*  PLT 281   BMET:  Recent Labs    02/05/17 0451 02/07/17 0409  NA 137 137  K 3.9 4.2  CL 107 106  CO2 22 24  GLUCOSE 106* 90  BUN 15 12  CREATININE 0.97 1.04*  CALCIUM 8.3* 8.4*    PT/INR:  Recent Labs    02/07/17 0409  LABPROT 20.1*  INR 1.73   ABG    Component Value Date/Time   PHART 7.371 01/30/2017 2321   HCO3 22.0 01/30/2017 2321   TCO2 23 01/31/2017 1632   ACIDBASEDEF 3.0 (H) 01/30/2017 2321   O2SAT 98.0 01/30/2017 2321    CBG (last 3)  Recent Labs    02/05/17 2149 02/06/17 0806 02/06/17 1241  GLUCAP 110* 103* 106*    Assessment/Plan: S/P Procedure(s) (LRB): CORONARY ARTERY BYPASS GRAFTING (CABG), Times four  using the right saphaneous vein, harvested endoscopicly.  and left internal mammary artery . (N/A) TRANSESOPHAGEAL ECHOCARDIOGRAM (TEE) (N/A) MITRAL VALVE REPAIR (MVR) (N/A)  1. CV-atrial fibrillation rate 100s-130s, BP low normal on midodrine. On Amio and Digoxin for atrial fibrillation. On Coumadin. Will order another bolus this morning.  2. Pulm-tolerating room air. No recent CXR. 3. Renal-1.04 creatinine, electrolytes okay. Remains fluid overloaded, weight is trending down. On Lasix 40mg  Daily.  4. H and H stable, expected acute blood loss anemia.  5. Endo-blood glucose well controlled 6. Anticoagulation-on coumadin INR 1.73 today.  7. Having some pain and it appears Tylenol is the only medication ordered. The patient states she cannot tolerate tramadol and does not do well with narcotics however, asking if she can have morphine. Will try Tylenol 2 for pain and if the patient has nausea will discontinue.   Plan: continue ambulation and incentive spirometer use. Amio bolus x 1.    Paged regarding the patient's cough and the patient requesting a CXR due to her feeling like she has pneumonia. She had not had a CXR in 5 days therefore, I order one. CXR from today  shows: small bilateral pleural effusions and left lower lobe atelectasis or infiltrate. Cardiomegaly with vascular congestion. Encourage incentive spirometer. On Lasix 40mg  daily.    LOS: 15 days    Elgie Collard 02/07/2017   CXR reviewed looks fine BP fine today, nsr  Aim for DC mon on po amio patient examined and medical record reviewed,agree with above note. Tharon Aquas Trigt III 02/07/2017

## 2017-02-07 NOTE — Progress Notes (Signed)
Patient refused ambulation at this time, patient c/o strong, painful, productive cough and stated that she feels like she has pneumonia, she requested for a chest xray, Tessa PA notified. Patient educated on the need to ambulate and continue to use her I S, she verbalized understanding. Will continue to monitor.

## 2017-02-08 LAB — PROTIME-INR
INR: 1.82
Prothrombin Time: 20.9 seconds — ABNORMAL HIGH (ref 11.4–15.2)

## 2017-02-08 MED ORDER — AMIODARONE HCL 200 MG PO TABS
400.0000 mg | ORAL_TABLET | Freq: Two times a day (BID) | ORAL | Status: DC
  Administered 2017-02-08 – 2017-02-11 (×6): 400 mg via ORAL
  Filled 2017-02-08 (×6): qty 2

## 2017-02-08 MED ORDER — MENTHOL 3 MG MT LOZG
1.0000 | LOZENGE | OROMUCOSAL | Status: DC | PRN
Start: 2017-02-08 — End: 2017-02-11
  Administered 2017-02-08 (×2): 3 mg via ORAL
  Filled 2017-02-08: qty 9

## 2017-02-08 NOTE — Progress Notes (Signed)
Patient sitting on the recliner c/o shortness of breath and stated that she was not feeling good, she denied chest pain, patient refused to ambulate at this time,patient education completed , she verbalized understanding, patient was observed using her incentive spirometer, vital signs obtained see flowsheet HR 99-130's, will continue to monitor

## 2017-02-08 NOTE — Progress Notes (Signed)
Patient ambulated in the hall twice on this shift,using a front wheel walker,ambulation well tolerated.

## 2017-02-08 NOTE — Progress Notes (Addendum)
      [address]       [place] [address]             [phone]      9 Days Post-Op Procedure(s) (LRB): CORONARY ARTERY BYPASS GRAFTING (CABG), Times four  using the right saphaneous vein, harvested endoscopicly.  and left internal mammary artery . (N/A) TRANSESOPHAGEAL ECHOCARDIOGRAM (TEE) (N/A) MITRAL VALVE REPAIR (MVR) (N/A) Subjective: States that the pain medication helped yesterday. She is still in afib rate 130s-140s  Objective: Vital signs in last 24 hours: Temp:  [97.4 F (36.3 C)-99.7 F (37.6 C)] 98.6 F (37 C) (11/18 0533) Pulse Rate:  [81-111] 86 (11/18 0338) Cardiac Rhythm: Normal sinus rhythm (11/18 0741) Resp:  [21-28] 25 (11/18 0338) BP: (98-147)/(69-87) 106/70 (11/18 0338) SpO2:  [93 %] 93 % (11/18 0338) Weight:  [252 lb (114.3 kg)] 252 lb (114.3 kg) (11/18 0536)     Intake/Output from previous day: 11/17 0701 - 11/18 0700 In: 963 [P.O.:960; I.V.:3] Out: 2100 [Urine:2100] Intake/Output this shift: No intake/output data recorded.  General appearance: alert, cooperative and no distress Heart: irregularly irregular rhythm Lungs: clear to auscultation bilaterally Abdomen: soft, non-tender; bowel sounds normal; no masses,  no organomegaly Extremities: 1+ nonpitting pedal edema Wound: clean and dry  Lab Results: Recent Labs    02/07/17 0409  WBC 7.7  HGB 8.2*  HCT 24.2*  PLT 281   BMET:  Recent Labs    02/07/17 0409  NA 137  K 4.2  CL 106  CO2 24  GLUCOSE 90  BUN 12  CREATININE 1.04*  CALCIUM 8.4*    PT/INR:  Recent Labs    02/08/17 0216  LABPROT 20.9*  INR 1.82   ABG    Component Value Date/Time   PHART 7.371 01/30/2017 2321   HCO3 22.0 01/30/2017 2321   TCO2 23 01/31/2017 1632   ACIDBASEDEF 3.0 (H) 01/30/2017 2321   O2SAT 98.0 01/30/2017 2321   CBG (last 3)  Recent Labs    02/05/17 2149 02/06/17 0806 02/06/17 1241  GLUCAP 110* 103* 106*    Assessment/Plan: S/P Procedure(s) (LRB): CORONARY  ARTERY BYPASS GRAFTING (CABG), Times four  using the right saphaneous vein, harvested endoscopicly.  and left internal mammary artery . (N/A) TRANSESOPHAGEAL ECHOCARDIOGRAM (TEE) (N/A) MITRAL VALVE REPAIR (MVR) (N/A)  1. CV-atrial fibrillation rate 130s-140s, about to get morning meds. BP low normal on midodrine. On Amio and Digoxin for atrial fibrillation. On Coumadin.  2. Pulm-tolerating room air. CXR yesterday stable 3. Renal-1.04 creatinine, electrolytes okay. Remains fluid overloaded, weight is trending down. On Lasix 40mg  Daily.  4. H and H stable, expected acute blood loss anemia.  5. Endo-blood glucose well controlled 6. Anticoagulation-on coumadin INR 1.82 today.  7. Tylenol 3 for pain   Plan: Continue to work on controlling HR better. Continue diuretics for fluid overload. Continue coumadin. Goal home in 1-2 days.        LOS: 16 days    Elgie Collard 02/08/2017  HR well controlled this pm  On room air INR approaching therapeutic patient going to walk in hall  Edema better Home in 2 days  patient examined and medical record reviewed,agree with above note. Tharon Aquas Trigt III 02/08/2017

## 2017-02-09 LAB — PROTIME-INR
INR: 1.98
Prothrombin Time: 22.4 seconds — ABNORMAL HIGH (ref 11.4–15.2)

## 2017-02-09 MED ORDER — MIDODRINE HCL 5 MG PO TABS
5.0000 mg | ORAL_TABLET | Freq: Three times a day (TID) | ORAL | Status: DC
  Administered 2017-02-09 – 2017-02-11 (×6): 5 mg via ORAL
  Filled 2017-02-09 (×5): qty 1

## 2017-02-09 NOTE — Progress Notes (Signed)
OT Cancellation Note  Patient Details Name: [name] MRN: [id number] DOB: 07-30-1955   Cancelled Treatment:    Reason Eval/Treat Not Completed: Patient declined, no reason specified. Pt declined to participate in ADL at this time stating that she is still drowsy from receiving medications. Reports she will be ready to participate later today. Will check back as schedule allows.   Norman Herrlich, MS OTR/L  Pager: [place] [name] 02/09/2017, 11:40 AM

## 2017-02-09 NOTE — Progress Notes (Signed)
Pt politely declines getting to the chair at this time. States she got her xanax and hydrocodone last night which has made her a bit sleepy this morning. Will continue to educate on importance of mobility.   Fritz Pickerel, RN

## 2017-02-09 NOTE — Progress Notes (Signed)
Physical Therapy Treatment Patient Details Name: [name] MRN: [id number] DOB: 08/17/1955 Today's Date: 02/09/2017    History of Present Illness Pt is a 61 y.o. female admitted on 01/23/17 with several week h/o jaw, neck, shoulder, and back pain with exertion; now s/p CABGx4 on 11/9. Pertinent PMH includes lupus, hypothyroidism, CAD s/p stenting (2003), ischemic cardiomyopathy, tobacco abuse.    PT Comments    Pt has progressed well with mobility, indep for transfers and amb with no AD. Educ on sternal precautions, scar desensitization, and importance of continued mobility. Pt reports she plans to participate with Cardiac Rehab phase II. All education completed and questions answered. Pt has met short-term acute PT goals. Will d/c PT.   Follow Up Recommendations  Other (comment)(Cardiac Rehab)     Equipment Recommendations  Rolling walker with 5" wheels    Recommendations for Other Services       Precautions / Restrictions Precautions Precautions: Sternal    Mobility  Bed Mobility Overal bed mobility: Modified Independent             General bed mobility comments: Received sitting in chair; reports log roll technique has really helped for bed mob  Transfers Overall transfer level: Independent Equipment used: None Transfers: Sit to/from Stand              Ambulation/Gait Ambulation/Gait assistance: Independent Ambulation Distance (Feet): 300 Feet Assistive device: None Gait Pattern/deviations: Step-through pattern;Decreased stride length Gait velocity: Decreased       Stairs Stairs: Yes   Stair Management: One rail Right;Step to pattern;Forwards Number of Stairs: 11 General stair comments: Ascend/descended steps with single UE support on rail; good technique  Wheelchair Mobility    Modified Rankin (Stroke Patients Only)       Balance Overall balance assessment: Needs assistance   Sitting balance-Leahy Scale: Good       Standing  balance-Leahy Scale: Good                              Cognition Arousal/Alertness: Awake/alert Behavior During Therapy: WFL for tasks assessed/performed Overall Cognitive Status: Within Functional Limits for tasks assessed                                        Exercises      General Comments General comments (skin integrity, edema, etc.): Pt reports sternal scar hypersensitivity; educ on desensitization techniques for this      Pertinent Vitals/Pain Pain Assessment: No/denies pain    Home Living                      Prior Function            PT Goals (current goals can now be found in the care plan section) Acute Rehab PT Goals Patient Stated Goal: Return home PT Goal Formulation: With patient Time For Goal Achievement: 02/20/17 Potential to Achieve Goals: Good Progress towards PT goals: Goals met/education completed, patient discharged from PT    Frequency    Min 3X/week      PT Plan Current plan remains appropriate    Co-evaluation              AM-PAC PT "6 Clicks" Daily Activity  Outcome Measure  Difficulty turning over in bed (including adjusting bedclothes, sheets and blankets)?: None Difficulty moving from lying on   back to sitting on the side of the bed? : None Difficulty sitting down on and standing up from a chair with arms (e.g., wheelchair, bedside commode, etc,.)?: None Help needed moving to and from a bed to chair (including a wheelchair)?: None Help needed walking in hospital room?: None Help needed climbing 3-5 steps with a railing? : A Little 6 Click Score: 23    End of Session Equipment Utilized During Treatment: Gait belt Activity Tolerance: Patient tolerated treatment well Patient left: in chair;with call bell/phone within reach Nurse Communication: Mobility status PT Visit Diagnosis: Other abnormalities of gait and mobility (R26.89)     Time: 9163-8466 PT Time Calculation (min) (ACUTE  ONLY): 15 min  Charges:  $Gait Training: 8-22 mins                    G Codes:      Mabeline Caras, PT, DPT Acute Rehab Services  Pager: [place] 02/09/2017, 1:49 PM

## 2017-02-09 NOTE — Progress Notes (Addendum)
[address]       [name] [address]             [phone]      10 Days Post-Op Procedure(s) (LRB): CORONARY ARTERY BYPASS GRAFTING (CABG), Times four  using the right saphaneous vein, harvested endoscopicly.  and left internal mammary artery . (N/A) TRANSESOPHAGEAL ECHOCARDIOGRAM (TEE) (N/A) MITRAL VALVE REPAIR (MVR) (N/A) Subjective: Feels fairly well, still having some afib with RVR  Objective: Vital signs in last 24 hours: Temp:  [98.5 F (36.9 C)-99.4 F (37.4 C)] 98.5 F (36.9 C) (11/19 0600) Pulse Rate:  [77-129] 77 (11/19 0600) Cardiac Rhythm: Atrial fibrillation;Atrial flutter (11/19 0700) Resp:  [20-31] 23 (11/19 0600) BP: (105-126)/(71-92) 118/77 (11/19 0600) SpO2:  [90 %-95 %] 90 % (11/19 0600) Weight:  [249 lb 6.4 oz (113.1 kg)] 249 lb 6.4 oz (113.1 kg) (11/19 0500)  Hemodynamic parameters for last 24 hours:    Intake/Output from previous day: 11/18 0701 - 11/19 0700 In: 720 [P.O.:720] Out: 550 [Urine:550] Intake/Output this shift: No intake/output data recorded.  General appearance: alert, cooperative and no distress Heart: regular rate and rhythm Lungs: clear to auscultation bilaterally Abdomen: benign Extremities: + edema Wound: incis healing well  Lab Results: Recent Labs    02/07/17 0409  WBC 7.7  HGB 8.2*  HCT 24.2*  PLT 281   BMET:  Recent Labs    02/07/17 0409  NA 137  K 4.2  CL 106  CO2 24  GLUCOSE 90  BUN 12  CREATININE 1.04*  CALCIUM 8.4*    PT/INR:  Recent Labs    02/09/17 0222  LABPROT 22.4*  INR 1.98   ABG    Component Value Date/Time   PHART 7.371 01/30/2017 2321   HCO3 22.0 01/30/2017 2321   TCO2 23 01/31/2017 1632   ACIDBASEDEF 3.0 (H) 01/30/2017 2321   O2SAT 98.0 01/30/2017 2321   CBG (last 3)  Recent Labs    02/06/17 0806 02/06/17 1241  GLUCAP 103* 106*    Meds Scheduled Meds: . ALPRAZolam  1 mg Oral QHS  . amiodarone  400 mg Oral BID  . aspirin EC  81 mg Oral Daily   . azaTHIOprine  50 mg Oral BID  . calcium carbonate  1 tablet Oral BID WC  . digoxin  0.125 mg Oral Daily  . fluticasone  2 spray Each Nare Daily  . furosemide  40 mg Oral Daily  . levothyroxine  75 mcg Oral QAC breakfast  . mouth rinse  15 mL Mouth Rinse BID  . midodrine  10 mg Oral TID WC  . moving right along book   Does not apply Once  . pantoprazole  40 mg Oral Daily  . potassium chloride  20 mEq Oral BID  . rosuvastatin  40 mg Oral q1800  . sodium chloride flush  3 mL Intravenous Q12H  . warfarin  5 mg Oral q1800  . Warfarin - Physician Dosing Inpatient   Does not apply q1800   Continuous Infusions: . sodium chloride     PRN Meds:.sodium chloride, acetaminophen, acetaminophen-codeine, HYDROcodone-acetaminophen, menthol-cetylpyridinium, ondansetron (ZOFRAN) IV, sodium chloride flush  Xrays Dg Chest Port 1 View  Result Date: 02/07/2017 CLINICAL DATA:  Cough, shortness of breath, chest pain EXAM: PORTABLE CHEST 1 VIEW COMPARISON:  02/02/2017 FINDINGS: Cardiomegaly, prior CABG. Small left pleural effusion with left lower lobe atelectasis or infiltrate. Mild vascular congestion and small right effusion. IMPRESSION: Cardiomegaly with vascular congestion. Small bilateral pleural  effusions. Left lower lobe atelectasis or infiltrate. Electronically Signed   By: Rolm Baptise M.D.   On: 02/07/2017 11:02    Assessment/Plan: S/P Procedure(s) (LRB): CORONARY ARTERY BYPASS GRAFTING (CABG), Times four  using the right saphaneous vein, harvested endoscopicly.  and left internal mammary artery . (N/A) TRANSESOPHAGEAL ECHOCARDIOGRAM (TEE) (N/A) MITRAL VALVE REPAIR (MVR) (N/A)  Slow, steady progress conts to have afib, check dig level, ? Add low dose betablocker conts diuresis for volume overload No new labs except INR which is trending up, 1.98 today- cont current coumadin Will need wires out when rate better controlled  LOS: 17 days    [name] 02/09/2017   Chart reviewed,  patient examined, agree with above. She is looks much better overall. Ambulated stairs with PT. They feel that she can go home with home PT. She is in sinus 70's most of the time but still having some episodes of atrial fib. Rate is not that fast and she did not know that she was in it this am. Will check digoxin level again tomorrow. I have not started beta blocker since she is on midodrine to keep BP up. Her BP is 120's this afternoon so will decrease midodrine to 5 tid. Pacing wires out in am. Plan home wed.

## 2017-02-09 NOTE — Progress Notes (Signed)
CARDIAC REHAB PHASE I   Pt just returned from walking with PT, no complaints. Cardiac surgery discharge education completed. Reviewed risk factors, IS, sternal precautions, activity progression, exercise, heart healthy diet, daily weights and phase 2 cardiac rehab. Pt verbalized understanding. Pt agrees to phase 2 cardiac rehab referral, will send to [facility] per pt request. Pt in recliner, call bell within reach. Encouraged additional ambulation today. Will follow.   [place], RN, BSN 02/09/2017 2:28 PM

## 2017-02-10 LAB — CBC
HCT: 25.4 % — ABNORMAL LOW (ref 36.0–46.0)
Hemoglobin: 8.4 g/dL — ABNORMAL LOW (ref 12.0–15.0)
MCH: 33.2 pg (ref 26.0–34.0)
MCHC: 33.1 g/dL (ref 30.0–36.0)
MCV: 100.4 fL — ABNORMAL HIGH (ref 78.0–100.0)
Platelets: 362 10*3/uL (ref 150–400)
RBC: 2.53 MIL/uL — ABNORMAL LOW (ref 3.87–5.11)
RDW: 16.5 % — ABNORMAL HIGH (ref 11.5–15.5)
WBC: 7.6 10*3/uL (ref 4.0–10.5)

## 2017-02-10 LAB — PROTIME-INR
INR: 2.21
Prothrombin Time: 24.4 seconds — ABNORMAL HIGH (ref 11.4–15.2)

## 2017-02-10 LAB — DIGOXIN LEVEL: Digoxin Level: 0.5 ng/mL — ABNORMAL LOW (ref 0.8–2.0)

## 2017-02-10 MED ORDER — DIGOXIN 125 MCG PO TABS
0.2500 mg | ORAL_TABLET | Freq: Every day | ORAL | Status: DC
  Administered 2017-02-10 – 2017-02-11 (×2): 0.25 mg via ORAL
  Filled 2017-02-10 (×2): qty 2

## 2017-02-10 MED ORDER — WARFARIN SODIUM 2.5 MG PO TABS
2.5000 mg | ORAL_TABLET | Freq: Every day | ORAL | Status: DC
  Administered 2017-02-10: 2.5 mg via ORAL
  Filled 2017-02-10: qty 1

## 2017-02-10 MED ORDER — FE FUMARATE-B12-VIT C-FA-IFC PO CAPS
1.0000 | ORAL_CAPSULE | Freq: Three times a day (TID) | ORAL | Status: DC
  Administered 2017-02-10 – 2017-02-11 (×4): 1 via ORAL
  Filled 2017-02-10 (×3): qty 1

## 2017-02-10 MED ORDER — BISACODYL 5 MG PO TBEC
10.0000 mg | DELAYED_RELEASE_TABLET | Freq: Every day | ORAL | Status: DC | PRN
  Administered 2017-02-10: 10 mg via ORAL
  Filled 2017-02-10: qty 2

## 2017-02-10 NOTE — Progress Notes (Signed)
EPW d/c'd per order and per protocol. Tips intact. VSS. Pt educated on need for q15m vitals and bedrest x1h. Call bell and phone within reach. Will continue to monitor. 

## 2017-02-10 NOTE — Care Management Note (Signed)
Case Management Note Marvetta Gibbons RN, BSN Unit 4E-Case Manager [phone]  Patient Details  Name: [name] MRN: [id number] Date of Birth: 1955-07-17  Subjective/Objective:   Pt admitted with CAD s/p cath with MVD- plan for CABG on 01/30/17                 Action/Plan: PTA pt lived at home alone- has support after discharge- brother, Friends and church members that can provide assistance-  CM to follow post op for d/c needs-   Expected Discharge Date:                  Expected Discharge Plan:  Home/Self Care  In-House Referral:  NA  Discharge planning Services  CM Consult  Post Acute Care Choice:  Durable Medical Equipment Choice offered to:  Patient  DME Arranged:  Walker rolling DME Agency:  [place]:    [facility] Agency:     Status of Service:  In process, will continue to follow  If discussed at Long Length of Stay Meetings, dates discussed:    Discharge Disposition:   Additional Comments:  02/10/17- 1130- Marvetta Gibbons RN, CM- spoke with pt at bedside regarding d/c needs- per pt she states she will need RW for home- notified Jermaine with [facility] for DME needs- RW to be delivered to room prior to discharge- pt also had questions about getting her records- pt provided phone # for medical records along with pt release form for her to fill out.   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  She states she will not be going to a SNF, she states this was some of her family members talking about SNF, but she will be going home.                  Dawayne Patricia, RN 02/10/2017, 2:38 PM

## 2017-02-10 NOTE — Progress Notes (Addendum)
[address]       [place] [address]             [phone]      11 Days Post-Op Procedure(s) (LRB): CORONARY ARTERY BYPASS GRAFTING (CABG), Times four  using the right saphaneous vein, harvested endoscopicly.  and left internal mammary artery . (N/A) TRANSESOPHAGEAL ECHOCARDIOGRAM (TEE) (N/A) MITRAL VALVE REPAIR (MVR) (N/A) Subjective: Maintaining sinus rhythm  Objective: Vital signs in last 24 hours: Temp:  [98.8 F (37.1 C)-99.7 F (37.6 C)] 99.7 F (37.6 C) (11/19 2041) Pulse Rate:  [76-79] 76 (11/20 0604) Cardiac Rhythm: Normal sinus rhythm (11/19 1955) Resp:  [20] 20 (11/19 1133) BP: (124)/(70) 124/70 (11/19 1133) SpO2:  [93 %] 93 % (11/19 1133) Weight:  [247 lb 11.2 oz (112.4 kg)] 247 lb 11.2 oz (112.4 kg) (11/20 0300)  Hemodynamic parameters for last 24 hours:    Intake/Output from previous day: 11/19 0701 - 11/20 0700 In: -  Out: 8546 [Urine:1620] Intake/Output this shift: No intake/output data recorded.  General appearance: alert, cooperative and no distress Heart: regular rate and rhythm Lungs: mildly dim in bases Abdomen: benign Extremities: + minor edema Wound: incis healing well  Lab Results: Recent Labs    02/10/17 0259  WBC 7.6  HGB 8.4*  HCT 25.4*  PLT 362   BMET: No results for input(s): NA, K, CL, CO2, GLUCOSE, BUN, CREATININE, CALCIUM in the last 72 hours.  PT/INR:  Recent Labs    02/10/17 0259  LABPROT 24.4*  INR 2.21   ABG    Component Value Date/Time   PHART 7.371 01/30/2017 2321   HCO3 22.0 01/30/2017 2321   TCO2 23 01/31/2017 1632   ACIDBASEDEF 3.0 (H) 01/30/2017 2321   O2SAT 98.0 01/30/2017 2321   CBG (last 3)  No results for input(s): GLUCAP in the last 72 hours.  Meds Scheduled Meds: . ALPRAZolam  1 mg Oral QHS  . amiodarone  400 mg Oral BID  . aspirin EC  81 mg Oral Daily  . azaTHIOprine  50 mg Oral BID  . calcium carbonate  1 tablet Oral BID WC  . digoxin  0.125 mg Oral Daily  .  fluticasone  2 spray Each Nare Daily  . levothyroxine  75 mcg Oral QAC breakfast  . mouth rinse  15 mL Mouth Rinse BID  . midodrine  5 mg Oral TID WC  . moving right along book   Does not apply Once  . pantoprazole  40 mg Oral Daily  . rosuvastatin  40 mg Oral q1800  . sodium chloride flush  3 mL Intravenous Q12H  . warfarin  5 mg Oral q1800  . Warfarin - Physician Dosing Inpatient   Does not apply q1800   Continuous Infusions: . sodium chloride     PRN Meds:.sodium chloride, acetaminophen, acetaminophen-codeine, HYDROcodone-acetaminophen, menthol-cetylpyridinium, ondansetron (ZOFRAN) IV, sodium chloride flush  Xrays No results found.  Assessment/Plan: S/P Procedure(s) (LRB): CORONARY ARTERY BYPASS GRAFTING (CABG), Times four  using the right saphaneous vein, harvested endoscopicly.  and left internal mammary artery . (N/A) TRANSESOPHAGEAL ECHOCARDIOGRAM (TEE) (N/A) MITRAL VALVE REPAIR (MVR) (N/A)  1 doing well 2 sinus rhythm, dig level 0.5- will increase dose to .25 mg daily 3 d/c wires today 4 poss stop midodrine prior to discharge 5 routine rehab/PT, pulm toilet 6 decrease coumadin to 2.5 mg 7 hypothyroid- will need close outpatient f/u 8 H/H improved  LOS: 18 days    [name] 02/10/2017  Chart reviewed, patient examined, agree with above. She looks good overall and should be able to go home tomorrow if no changes. INR will require followup. Goal is 2-2.5 for 3 months.

## 2017-02-10 NOTE — Progress Notes (Signed)
Occupational Therapy Treatment Patient Details Name: [name] MRN: [id number] DOB: [birth year] Today's Date: 02/10/2017    History of present illness Pt is a 61 y.o. female admitted on 01/23/17 with several week h/o jaw, neck, shoulder, and back pain with exertion; now s/p CABGx4 on 11/9. Pertinent PMH includes lupus, hypothyroidism, CAD s/p stenting (2003), ischemic cardiomyopathy, tobacco abuse.    OT comments  Pt demonstrating good progress and has met initial OT goals. She was able to demonstrate good understanding of sternal precautions during ADL tasks and functional mobility this session. Educated pt on safe activity progression post-acute D/C and she verbalizes understanding. Pt able to complete all ADL at modified independent level at this time. No further acute OT needs identified and all education complete. OT will sign off.    Follow Up Recommendations  No OT follow up;Supervision - Intermittent    Equipment Recommendations  None recommended by OT    Recommendations for Other Services      Precautions / Restrictions Precautions Precautions: Sternal Precaution Comments: Able to adhere to sternal precautions throughout ADL tasks.  Restrictions Weight Bearing Restrictions: Yes       Mobility Bed Mobility Overal bed mobility: Modified Independent Bed Mobility: Rolling;Sidelying to Sit;Sit to Sidelying           General bed mobility comments: Able to complete with good technique and no assistance.   Transfers Overall transfer level: Modified independent Equipment used: None             General transfer comment: Increased time. No VC's for adherance to sternal precautions.     Balance Overall balance assessment: Needs assistance Sitting-balance support: No upper extremity supported;Feet supported Sitting balance-Leahy Scale: Good     Standing balance support: No upper extremity supported;During functional activity Standing balance-Leahy Scale:  Good                             ADL either performed or assessed with clinical judgement   ADL Overall ADL's : Modified independent Eating/Feeding: Independent   Grooming: Modified independent;Standing   Upper Body Bathing: Modified independent   Lower Body Bathing: Modified independent;Sit to/from stand   Upper Body Dressing : Modified independent;Sitting   Lower Body Dressing: Modified independent;Sit to/from stand   Toilet Transfer: Modified Independent;Ambulation;Comfort height toilet   Toileting- Clothing Manipulation and Hygiene: Modified independent       Functional mobility during ADLs: Modified independent General ADL Comments: Able to complete all ADL without physical assistance and with increased time.      Vision       Perception     Praxis      Cognition Arousal/Alertness: Awake/alert Behavior During Therapy: WFL for tasks assessed/performed Overall Cognitive Status: Within Functional Limits for tasks assessed                                          Exercises     Shoulder Instructions       General Comments      Pertinent Vitals/ Pain       Pain Assessment: No/denies pain  Home Living                                          Prior  Functioning/Environment              Frequency  Min 2X/week        Progress Toward Goals  OT Goals(current goals can now be found in the care plan section)  Progress towards OT goals: Goals met/education completed, patient discharged from OT  Acute Rehab OT Goals Patient Stated Goal: Return home OT Goal Formulation: With patient Time For Goal Achievement: 02/13/17 Potential to Achieve Goals: Good  Plan Discharge plan remains appropriate;All goals met and education completed, patient discharged from OT services    Co-evaluation                 AM-PAC PT "6 Clicks" Daily Activity     Outcome Measure   Help from another person eating  meals?: None Help from another person taking care of personal grooming?: None Help from another person toileting, which includes using toliet, bedpan, or urinal?: None Help from another person bathing (including washing, rinsing, drying)?: None Help from another person to put on and taking off regular upper body clothing?: None Help from another person to put on and taking off regular lower body clothing?: None 6 Click Score: 24    End of Session    OT Visit Diagnosis: Unsteadiness on feet (R26.81)   Activity Tolerance Patient tolerated treatment well   Patient Left with call bell/phone within reach;in chair   Nurse Communication Mobility status        Time: 3750-5107 OT Time Calculation (min): 11 min  Charges: OT General Charges $OT Visit: 1 Visit OT Treatments $Self Care/Home Management : 8-22 mins  Norman Herrlich, MS OTR/L  Pager: [place] A Clement Deneault 02/10/2017, 11:28 AM

## 2017-02-10 NOTE — Progress Notes (Signed)
CARDIAC REHAB PHASE I   Pt sitting up in chair, states she just finished walking with OT, states she also walked earlier this morning independently, declines ambulation at this time. Pt states she will walk later today, independently. Pt in recliner, call bell within reach.  Lenna Sciara, RN, BSN 02/10/2017 1:34 PM

## 2017-02-10 NOTE — Progress Notes (Signed)
Patient walked for about 450 ft. Tolerated well.

## 2017-02-11 LAB — PROTIME-INR
INR: 2.37
Prothrombin Time: 25.7 seconds — ABNORMAL HIGH (ref 11.4–15.2)

## 2017-02-11 MED ORDER — MIDODRINE HCL 5 MG PO TABS: 5.0000 mg | ORAL_TABLET | Freq: Three times a day (TID) | ORAL | 0 refills | Status: DC

## 2017-02-11 MED ORDER — AMIODARONE HCL 200 MG PO TABS: 200.0000 mg | ORAL_TABLET | Freq: Two times a day (BID) | ORAL | 1 refills | Status: DC

## 2017-02-11 MED ORDER — DIGOXIN 250 MCG PO TABS: 0.2500 mg | ORAL_TABLET | Freq: Every day | ORAL | 1 refills | Status: DC

## 2017-02-11 MED ORDER — ACETAMINOPHEN-CODEINE #3 300-30 MG PO TABS: 1.0000 | ORAL_TABLET | Freq: Three times a day (TID) | ORAL | 0 refills | Status: DC | PRN

## 2017-02-11 MED ORDER — WARFARIN SODIUM 2.5 MG PO TABS: 2.5000 mg | ORAL_TABLET | Freq: Every day | ORAL | 2 refills | Status: DC

## 2017-02-11 MED ORDER — LEVOTHYROXINE SODIUM 75 MCG PO TABS: ORAL_TABLET | ORAL | 1 refills | Status: DC

## 2017-02-11 MED ORDER — FERROUS GLUCONATE 324 (38 FE) MG PO TABS: 324.0000 mg | ORAL_TABLET | Freq: Every day | ORAL | 3 refills | Status: DC

## 2017-02-11 NOTE — Progress Notes (Signed)
      [address]       [place] [address]             [phone]      12 Days Post-Op Procedure(s) (LRB): CORONARY ARTERY BYPASS GRAFTING (CABG), Times four  using the right saphaneous vein, harvested endoscopicly.  and left internal mammary artery . (N/A) TRANSESOPHAGEAL ECHOCARDIOGRAM (TEE) (N/A) MITRAL VALVE REPAIR (MVR) (N/A) Subjective: Minimal afib, feels well  Objective: Vital signs in last 24 hours: Temp:  [98.6 F (37 C)-98.8 F (37.1 C)] 98.8 F (37.1 C) (11/21 0422) Pulse Rate:  [84-105] 105 (11/21 0422) Cardiac Rhythm: Normal sinus rhythm (11/21 0800) Resp:  [18-24] 24 (11/21 0800) BP: (104-135)/(58-121) 109/83 (11/21 0422) SpO2:  [94 %-100 %] 94 % (11/21 0422) Weight:  [246 lb 6.4 oz (111.8 kg)] 246 lb 6.4 oz (111.8 kg) (11/21 0422)  Hemodynamic parameters for last 24 hours:    Intake/Output from previous day: 11/20 0701 - 11/21 0700 In: -  Out: 600 [Urine:600] Intake/Output this shift: No intake/output data recorded.  General appearance: alert, cooperative and no distress Heart: regular rate and rhythm Lungs: min dim in bases Abdomen: soft, nontender Extremities: minor edema Wound: incis healing well  Lab Results: Recent Labs    02/10/17 0259  WBC 7.6  HGB 8.4*  HCT 25.4*  PLT 362   BMET: No results for input(s): NA, K, CL, CO2, GLUCOSE, BUN, CREATININE, CALCIUM in the last 72 hours.  PT/INR:  Recent Labs    02/11/17 0252  LABPROT 25.7*  INR 2.37   ABG    Component Value Date/Time   PHART 7.371 01/30/2017 2321   HCO3 22.0 01/30/2017 2321   TCO2 23 01/31/2017 1632   ACIDBASEDEF 3.0 (H) 01/30/2017 2321   O2SAT 98.0 01/30/2017 2321   CBG (last 3)  No results for input(s): GLUCAP in the last 72 hours.  Meds Scheduled Meds: . ALPRAZolam  1 mg Oral QHS  . amiodarone  400 mg Oral BID  . aspirin EC  81 mg Oral Daily  . azaTHIOprine  50 mg Oral BID  . calcium carbonate  1 tablet Oral BID WC  . digoxin  0.25 mg  Oral Daily  . ferrous [id number] C-folic acid  1 capsule Oral TID PC  . fluticasone  2 spray Each Nare Daily  . levothyroxine  75 mcg Oral QAC breakfast  . mouth rinse  15 mL Mouth Rinse BID  . midodrine  5 mg Oral TID WC  . moving right along book   Does not apply Once  . pantoprazole  40 mg Oral Daily  . rosuvastatin  40 mg Oral q1800  . sodium chloride flush  3 mL Intravenous Q12H  . warfarin  2.5 mg Oral q1800  . Warfarin - Physician Dosing Inpatient   Does not apply q1800   Continuous Infusions: . sodium chloride     PRN Meds:.sodium chloride, acetaminophen, acetaminophen-codeine, bisacodyl, HYDROcodone-acetaminophen, menthol-cetylpyridinium, ondansetron (ZOFRAN) IV, sodium chloride flush  Xrays No results found.  Assessment/Plan: S/P Procedure(s) (LRB): CORONARY ARTERY BYPASS GRAFTING (CABG), Times four  using the right saphaneous vein, harvested endoscopicly.  and left internal mammary artery . (N/A) TRANSESOPHAGEAL ECHOCARDIOGRAM (TEE) (N/A) MITRAL VALVE REPAIR (MVR) (N/A)  1 doing well 2 home on 2.5 mg coumadin    LOS: 19 days    John Giovanni 02/11/2017

## 2017-02-11 NOTE — Care Management Note (Signed)
Case Management Note Marvetta Gibbons RN, BSN Unit 4E-Case Manager [phone]  Patient Details  Name: [name] MRN: [id number] Date of Birth: 06-27-1955  Subjective/Objective:   Pt admitted with CAD s/p cath with MVD- plan for CABG on 01/30/17                 Action/Plan: PTA pt lived at home alone- has support after discharge- brother, Friends and church members that can provide assistance-  CM to follow post op for d/c needs-   Expected Discharge Date:  02/11/17               Expected Discharge Plan:  Home/Self Care  In-House Referral:  NA  Discharge planning Services  CM Consult  Post Acute Care Choice:  Durable Medical Equipment Choice offered to:  Patient  DME Arranged:  Gilford Rile rolling DME Agency:  [place]:    [facility] Agency:     Status of Service:  Completed, signed off  If discussed at [place] of Stay Meetings, dates discussed:    Discharge Disposition: home/self care   Additional Comments:  02/11/17- 0950- Marvetta Gibbons RN, CM- pt for d/c home today- have notified Dan with [facility] for RW- which will be delivered to room prior to discharge- no further CM needs noted.   02/10/17- 1130- Marvetta Gibbons RN, CM- spoke with pt at bedside regarding d/c needs- per pt she states she will need RW for home- notified Jermaine with [facility] for DME needs- RW to be delivered to room prior to discharge- pt also had questions about getting her records- pt provided phone # for medical records along with pt release form for her to fill out.   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  She states she will not be going to a SNF, she states this was some of her family members talking about SNF, but she will be going home.                  Dawayne Patricia, RN 02/11/2017, 9:49 AM

## 2017-02-16 ENCOUNTER — Telehealth: Payer: Self-pay | Admitting: Internal Medicine

## 2017-02-16 ENCOUNTER — Telehealth (HOSPITAL_COMMUNITY): Payer: Self-pay

## 2017-02-16 ENCOUNTER — Encounter: Payer: PRIVATE HEALTH INSURANCE | Admitting: Internal Medicine

## 2017-02-16 NOTE — Telephone Encounter (Signed)
Patient requesting to transfer from [place] to [place].  Please advise.

## 2017-02-16 NOTE — Telephone Encounter (Signed)
Patients insurance is active and benefits verified through [employer] - No co-pay, deductible amount of $2,500/$1,520 has been met, out of pocket amount of $5,500/$1,520 has been met, 20% co-insurance, and no pre-authorization is required. Passport/reference [id number]  Patients insurance is active and benefits verified through Medicare Part A & B - No co-pay, deductible amount of $183.00/$183.00 has been met, no out of pocket, 20% co-insurance, and no pre-authorization is required. Passport/reference [id number]  Patient will be contacted and scheduled after their follow up appt with the Cardiologist office upon review by the RN Navigator.

## 2017-02-16 NOTE — Telephone Encounter (Signed)
Yes, I will see her 

## 2017-02-16 NOTE — Telephone Encounter (Signed)
Fine

## 2017-02-17 ENCOUNTER — Other Ambulatory Visit: Payer: Self-pay | Admitting: Internal Medicine

## 2017-02-17 NOTE — Telephone Encounter (Signed)
Got patient scheduled

## 2017-02-18 ENCOUNTER — Ambulatory Visit (INDEPENDENT_AMBULATORY_CARE_PROVIDER_SITE_OTHER): Payer: BLUE CROSS/BLUE SHIELD | Admitting: Pharmacist

## 2017-02-18 DIAGNOSIS — Z5181 Encounter for therapeutic drug level monitoring: Secondary | ICD-10-CM | POA: Diagnosis not present

## 2017-02-18 DIAGNOSIS — Z0000 Encounter for general adult medical examination without abnormal findings: Secondary | ICD-10-CM | POA: Insufficient documentation

## 2017-02-18 DIAGNOSIS — I4891 Unspecified atrial fibrillation: Secondary | ICD-10-CM | POA: Diagnosis not present

## 2017-02-18 DIAGNOSIS — I9789 Other postprocedural complications and disorders of the circulatory system, not elsewhere classified: Secondary | ICD-10-CM

## 2017-02-18 DIAGNOSIS — Z9889 Other specified postprocedural states: Secondary | ICD-10-CM

## 2017-02-18 LAB — POCT INR: INR: 1.6

## 2017-02-18 NOTE — Patient Instructions (Signed)
Start taking 1 tablet daily except 1.5 tablets on Sundays and Wednesdays. Recheck INR in 1 week.

## 2017-02-20 ENCOUNTER — Encounter: Payer: Self-pay | Admitting: Cardiology

## 2017-02-20 ENCOUNTER — Ambulatory Visit (INDEPENDENT_AMBULATORY_CARE_PROVIDER_SITE_OTHER): Payer: BLUE CROSS/BLUE SHIELD | Admitting: Cardiology

## 2017-02-20 VITALS — BP 140/80 | HR 81 | Resp 16 | Ht 67.0 in | Wt 237.0 lb

## 2017-02-20 DIAGNOSIS — I4891 Unspecified atrial fibrillation: Secondary | ICD-10-CM

## 2017-02-20 DIAGNOSIS — I2510 Atherosclerotic heart disease of native coronary artery without angina pectoris: Secondary | ICD-10-CM | POA: Diagnosis not present

## 2017-02-20 DIAGNOSIS — I200 Unstable angina: Secondary | ICD-10-CM

## 2017-02-20 DIAGNOSIS — Z951 Presence of aortocoronary bypass graft: Secondary | ICD-10-CM

## 2017-02-20 MED ORDER — AMIODARONE HCL 200 MG PO TABS: 200.0000 mg | ORAL_TABLET | Freq: Every day | ORAL | 3 refills | Status: DC

## 2017-02-20 MED ORDER — METOPROLOL TARTRATE 25 MG PO TABS: 12.5000 mg | ORAL_TABLET | Freq: Two times a day (BID) | ORAL | 3 refills | Status: DC

## 2017-02-20 NOTE — Progress Notes (Signed)
02/20/2017 [name]   11/08/1955  [id number]  Primary Physician Hoyt Koch, MD Primary Cardiologist: Dr. Angelena Form (Pt requesting change to Dr. Tamala Julian)   Reason for Visit/CC: [facility] F/u s/p CABG and Mitral Valve Repair, Post-operative Atrial Fibrillation  HPI:  [name] is a 61 y.o. 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 She is a former pt of Dr. Melvern Banker. After Dr. Ky Barban retirement, she established care with Dr. Angelena Form, but is now resting to change to Dr. Tamala Julian.   She was recently evaluated in clinic 01/20/17 for chest/ neck and shoulder pain. Symptoms were c/w unstable angina. She was set up for outpatient cath with Dr. Tamala Julian 01/23/17 and was noted to have severe three-vessel coronary disease with chronic total occlusion of the right coronary due to in-stent restenosis, 85% de novo stenosis in the proximal LAD, total occlusion of the large first diagonal, 70% proximal margin restenosis of mid circumflex stent and 80% stenosis in the first obtuse marginal which is crossed by the stent. Severe inferior wall hypokinesis.  EF 45-50%. Elevated LVEDP consistent with chronic combined systolic and diastolic heart failure. CABG was recommended. She was evaluated by Dr. Cyndia Bent who agreed that surgical revascularization was the best option. Given her mitral regurgitation, she underwent a TEE as part of presurgical w/u which showed severe regurgitation. Dr. Cyndia Bent recommended mitral valve repair at time of CABG.  On 01/30/17, she was taken to the OR and underwent CABG x 4 w/ LIMA to LAD, SVG to OM1 and OM2 and SVG to the PDA as well as mitral valve repair. Her hospiral course was pretty lengthy, as post op recovery was complicated  by hypotension requiring pressor support as well as post operative atrial fibrillation requiring amiodarone and coumadin. She was discharged on 02/11/17 on ASA, Amiodarone 200 mg BID, digoxin, Coumadin, midodrine for BP as well as Crestor. Her atenolol and losartan were both discontinued on discharge orders given her issues with hypotension.   She presents back to clinic today for f/u. She is doing ok. No recurrent anginal symptoms. No chest pain or dyspnea. She has some right neck and scapular pain that seems c/w with musculoskeletal pain. Hurts to move her arm. Has been taking tylenol at home.   EKG shows NSR. No afib. HR 81 bpm. BP is 140/80.   Current Meds  Medication Sig  . acetaminophen-codeine (TYLENOL #3) 300-30 MG tablet Take 1 tablet by mouth every 8 (eight) hours as needed for moderate pain.  Marland Kitchen amiodarone (PACERONE) 200 MG tablet Take 1 tablet (200 mg total) by mouth 2 (two) times daily.  Marland Kitchen aspirin 81 MG chewable tablet Chew 81 mg by mouth daily.   Marland Kitchen azaTHIOprine (IMURAN) 50 MG tablet Take 50 mg by mouth 2 (two) times daily.  . calcium carbonate (TUMS - DOSED IN MG ELEMENTAL CALCIUM) 500 MG chewable tablet Chew 1 tablet by mouth 2 (two) times daily with a meal.   . digoxin (LANOXIN) 0.25 MG tablet Take 1 tablet (0.25 mg total) by mouth daily.  . ferrous gluconate (FERGON) 324 MG tablet Take 1 tablet (324 mg total) by mouth daily with breakfast.  . fluticasone (FLONASE) 50 MCG/ACT nasal spray Place 2 sprays into both nostrils daily.  Marland Kitchen  levothyroxine (SYNTHROID, LEVOTHROID) 75 MCG tablet TAKE 1 TABLET(50 MCG) BY MOUTH DAILY  . midodrine (PROAMATINE) 5 MG tablet Take 1 tablet (5 mg total) by mouth 3 (three) times daily with meals.  . pantoprazole (PROTONIX) 40 MG tablet TAKE 1 TABLET(40 MG) BY MOUTH DAILY  . warfarin (COUMADIN) 2.5 MG tablet Take 1 tablet (2.5 mg total) by mouth daily at 6 PM. As directed by coumadin clinic   No Known Allergies Past Medical History:  Diagnosis Date  .  Chronic ischemic heart disease, unspecified    w/ stents in mid circumflex and mid RCA in 03, and balloon angioplasty of Diagonal branch 10-03, last cath Sept 08- 2 vessel disease w/ patent stents  . Esophageal reflux   . Hypothyroidism   . Immune thrombocytopenic purpura ([place])   . Lupus erythematosus   . Mitral valve insufficiency and aortic valve insufficiency   . Myocardial infarction ([place])    x 2  . Peripheral vascular disease, unspecified ([place])   . Unspecified disease of pericardium    Family History  Problem Relation Age of Onset  . Cancer Mother        breast and cervical  . Paget's disease of bone Mother   . Fibromyalgia Mother   . Hypertension Father   . Heart disease Father        PVD  . GER disease Father   . Cancer Paternal Grandfather        colon  . Heart attack Other   . Coronary artery disease Other   . Diabetes Other   . Hypertension Other   . Hyperlipidemia Other    Past Surgical History:  Procedure Laterality Date  . CHOLECYSTECTOMY, LAPAROSCOPIC  2010  . CORONARY ARTERY BYPASS GRAFT N/A 01/30/2017   Procedure: CORONARY ARTERY BYPASS GRAFTING (CABG), Times four  using the right saphaneous vein, harvested endoscopicly.  and left internal mammary artery .;  Surgeon: Gaye Pollack, MD;  Location: [facility] OR;  Service: Open Heart Surgery;  Laterality: N/A;  . LEFT HEART CATH AND CORONARY ANGIOGRAPHY N/A 01/23/2017   Procedure: LEFT HEART CATH AND CORONARY ANGIOGRAPHY;  Surgeon: Belva Crome, MD;  Location: [place] [facility];  Service: Cardiovascular;  Laterality: N/A;  . MITRAL VALVE REPAIR N/A 01/30/2017   Procedure: MITRAL VALVE REPAIR (MVR);  Surgeon: Gaye Pollack, MD;  Location: [place];  Service: Open Heart Surgery;  Laterality: N/A;  . plastic surgical repair      dog bite to leg  . SPLENECTOMY  5-04  . TEE WITHOUT CARDIOVERSION N/A 01/27/2017   Procedure: TRANSESOPHAGEAL ECHOCARDIOGRAM (TEE);  Surgeon: Sanda Klein, MD;  [facility] END[facility]COPY;   Service: Cardiovascular;  Laterality: N/A;  . TEE WITHOUT CARDIOVERSION N/A 01/30/2017   Procedure: TRANSESOPHAGEAL ECHOCARDIOGRAM (TEE);  Surgeon: Gaye Pollack, MD;  Location: [place];  Service: Open Heart Surgery;  Laterality: N/A;  . ULTRASOUND GUIDANCE FOR VASCULAR ACCESS  01/23/2017   Procedure: Ultrasound Guidance For Vascular Access;  Surgeon: Belva Crome, MD;  Location: [place] [facility];  Service: Cardiovascular;;   Social History   Socioeconomic History  . Marital status: Single    Spouse name: Not on file  . Number of children: Not on file  . Years of education: Not on file  . Highest education level: Not on file  Social Needs  . Financial resource strain: Not on file  . Food insecurity - worry: Not on file  . Food insecurity - inability: Not on file  .  Transportation needs - medical: Not on file  . Transportation needs - non-medical: Not on file  Occupational History  . Not on file  Tobacco Use  . Smoking status: Current Some Day Smoker    Packs/day: 0.50    Years: 30.00    Pack years: 15.00    Types: Cigarettes    Last attempt to quit: 12/23/2011    Years since quitting: 5.1  . Smokeless tobacco: Never Used  Substance and Sexual Activity  . Alcohol use: No  . Drug use: No  . Sexual activity: No  Other Topics Concern  . Not on file  Social History Narrative   HSG,  graduated from [employer] in [place]. In college [name] -grad '13 with relgious studies. [employer] - Fall '13 for MA-divinity. Marrried '73 - 2 years/ divorced. 2 son- '73, '75 - CP.    Occupation: full-time Ship broker. She lives alone with her younger son living with her part-time but he resides in a managed care facility.      Review of Systems: General: negative for chills, fever, night sweats or weight changes.  Cardiovascular: negative for chest pain, dyspnea on exertion, edema, orthopnea, palpitations, paroxysmal nocturnal dyspnea or shortness of breath Dermatological: negative  for rash Respiratory: negative for cough or wheezing Urologic: negative for hematuria Abdominal: negative for nausea, vomiting, diarrhea, bright red blood per rectum, melena, or hematemesis Neurologic: negative for visual changes, syncope, or dizziness All other systems reviewed and are otherwise negative except as noted above.   Physical Exam:  Blood pressure 140/80, pulse 81, resp. rate 16, height 5\' 7"  (1.702 m), weight 237 lb (107.5 kg).  General appearance: alert, cooperative and no distress Neck: no carotid bruit and no JVD Lungs: clear to auscultation bilaterally Heart: regular rate and rhythm, S1, S2 normal, no murmur, click, rub or gallop Extremities: extremities normal, atraumatic, no cyanosis or edema Pulses: 2+ and symmetric Skin: Skin color, texture, turgor normal. No rashes or lesions Neurologic: Grossly normal  EKG NSR 81 bpm -- personally reviewed   ASSESSMENT AND PLAN:   1. CAD: multi native vessel CAD, s/p recent CABG x 4 (LIMA-LAD, SVG-OM1 and OM2, and SVG to PDA) 01/30/17 by Dr. Cyndia Bent. Stable w/o CP. Continue medical therapy w/ ASA and statin. Will add BB now that BP is stable.   2. Mitral Regurgitation: s/p MV repair per Dr. Cyndia Bent 01/30/17 at time of CABG for severe MR.   3. Disatolic HF: EF by echo 41/6/60 was normal at 55-60%. Volume is stable. BP is controlled. Will add BB.   4. Post-operative Atrial Fibrillation: required initiation of amiodarone and coumadin.  EKG today shows NSR. Will titrate down amiodarone from 200 mg BID, down to 200 mg daily. If she proves to maintain NSR at her upcomming f/u visits, we can hopefully discontinue amiodarone and coumadin. Will continue for now.  5. Anticoagulation Therapy: on coumadin for post-op afib, considered valvular given severe MR. INRs are followed in our coumadin clinic. Goal is 2.0-3.0.   6. Hypotension: required pressor support post surgery. Ultimately weaned off. Midodrine added at discharge. BP is now stable.    7. HLD: LDL on 01/26/2017 was at goal at 52 mg/dL. Goal is < 70 mg/dL. Continue statin therapy with Crestor.   8. Bilateral Carotid Artery: pre CABG doppler study showed mild disease, 1-39% bilateral ICA stenosis. Continue medical management with ASA and statin therapy. Repeat dopplers every 2-3 years or when clinically indicated.    Follow-Up: pt has f/u with  Dr. Cyndia Bent in December. F/u in primary cardiologist in 6-8 weeks.   Brittainy Ladoris Gene, MHS [facility] 02/20/2017 11:27 AM

## 2017-02-20 NOTE — Patient Instructions (Signed)
Medication Instructions:  1. DECREASE AMIODARONE TO 200 MG ONCE A DAY  2. START METOPROLOL TARTRATE 25 MG TABLET WITH DIRECTIONS TO READ:TAKE 1/2 TABLET = 12.5 MG TWICE DAILY  Labwork: NONE ORDERED TODAY  Testing/Procedures: NONE ORDERED TODAY  Follow-Up: DR. Tamala Julian 04/09/17 @ 9:20   Any Other Special Instructions Will Be Listed Below (If Applicable).     If you need a refill on your cardiac medications before your next appointment, please call your pharmacy.

## 2017-02-23 ENCOUNTER — Telehealth (HOSPITAL_COMMUNITY): Payer: Self-pay

## 2017-02-23 NOTE — Telephone Encounter (Signed)
Called and spoke with patient in regards to Cardiac Rehab - Patient would like for me to call on 03/19/2017.

## 2017-02-24 ENCOUNTER — Encounter: Payer: Self-pay | Admitting: Internal Medicine

## 2017-02-24 ENCOUNTER — Ambulatory Visit (INDEPENDENT_AMBULATORY_CARE_PROVIDER_SITE_OTHER): Payer: BLUE CROSS/BLUE SHIELD | Admitting: Internal Medicine

## 2017-02-24 VITALS — BP 134/80 | HR 76 | Temp 98.0°F | Resp 16 | Ht 67.0 in | Wt 234.5 lb

## 2017-02-24 DIAGNOSIS — E032 Hypothyroidism due to medicaments and other exogenous substances: Secondary | ICD-10-CM | POA: Diagnosis not present

## 2017-02-24 DIAGNOSIS — M329 Systemic lupus erythematosus, unspecified: Secondary | ICD-10-CM | POA: Diagnosis not present

## 2017-02-24 DIAGNOSIS — I200 Unstable angina: Secondary | ICD-10-CM

## 2017-02-24 DIAGNOSIS — D539 Nutritional anemia, unspecified: Secondary | ICD-10-CM | POA: Insufficient documentation

## 2017-02-24 NOTE — Patient Instructions (Signed)
Hypothyroidism Hypothyroidism is a disorder of the thyroid. The thyroid is a large gland that is located in the lower front of the neck. The thyroid releases hormones that control how the body works. With hypothyroidism, the thyroid does not make enough of these hormones. What are the causes? Causes of hypothyroidism may include:  Viral infections.  Pregnancy.  Your own defense system (immune system) attacking your thyroid.  Certain medicines.  Birth defects.  Past radiation treatments to your head or neck.  Past treatment with radioactive iodine.  Past surgical removal of part or all of your thyroid.  Problems with the gland that is located in the center of your brain (pituitary).  What are the signs or symptoms? Signs and symptoms of hypothyroidism may include:  Feeling as though you have no energy (lethargy).  Inability to tolerate cold.  Weight gain that is not explained by a change in diet or exercise habits.  Dry skin.  Coarse hair.  Menstrual irregularity.  Slowing of thought processes.  Constipation.  Sadness or depression.  How is this diagnosed? Your health care provider may diagnose hypothyroidism with blood tests and ultrasound tests. How is this treated? Hypothyroidism is treated with medicine that replaces the hormones that your body does not make. After you begin treatment, it may take several weeks for symptoms to go away. Follow these instructions at home:  Take medicines only as directed by your health care provider.  If you start taking any new medicines, tell your health care provider.  Keep all follow-up visits as directed by your health care provider. This is important. As your condition improves, your dosage needs may change. You will need to have blood tests regularly so that your health care provider can watch your condition. Contact a health care provider if:  Your symptoms do not get better with treatment.  You are taking thyroid  replacement medicine and: ? You sweat excessively. ? You have tremors. ? You feel anxious. ? You lose weight rapidly. ? You cannot tolerate heat. ? You have emotional swings. ? You have diarrhea. ? You feel weak. Get help right away if:  You develop chest pain.  You develop an irregular heartbeat.  You develop a rapid heartbeat. This information is not intended to replace advice given to you by your health care provider. Make sure you discuss any questions you have with your health care provider. Document Released: 03/10/2005 Document Revised: 08/16/2015 Document Reviewed: 07/26/2013 Elsevier Interactive Patient Education  2017 Elsevier Inc.  

## 2017-02-24 NOTE — Progress Notes (Signed)
Subjective:  Patient ID: [name], female    DOB: 11-07-1955  Age: 60 y.o. MRN: [id number]  CC: Anemia; Hypothyroidism; and Atrial Fibrillation   HPI [name] presents for a patient with a new PCP.  She recently underwent CABG and mitral valve repair.  She developed postop atrial fibrillation and is maintained on amiodarone.  She is also being followed for hypothyroidism.  She tells me her rheumatologist did lab work on her earlier today including LFTs and a CBC.  She does not know the results yet.  She feels offers no new complaints today.  Outpatient Medications Prior to Visit  Medication Sig Dispense Refill  . acetaminophen-codeine (TYLENOL #3) 300-30 MG tablet Take 1 tablet by mouth every 8 (eight) hours as needed for moderate pain. 30 tablet 0  . amiodarone (PACERONE) 200 MG tablet Take 1 tablet (200 mg total) by mouth daily. 90 tablet 3  . aspirin 81 MG chewable tablet Chew 81 mg by mouth daily.     Marland Kitchen azaTHIOprine (IMURAN) 50 MG tablet Take 50 mg by mouth 2 (two) times daily.    . calcium carbonate (TUMS - DOSED IN MG ELEMENTAL CALCIUM) 500 MG chewable tablet Chew 1 tablet by mouth 2 (two) times daily with a meal.     . digoxin (LANOXIN) 0.25 MG tablet Take 1 tablet (0.25 mg total) by mouth daily. 30 tablet 1  . ferrous gluconate (FERGON) 324 MG tablet Take 1 tablet (324 mg total) by mouth daily with breakfast. 30 tablet 3  . fluticasone (FLONASE) 50 MCG/ACT nasal spray Place 2 sprays into both nostrils daily. 48 g 1  . levothyroxine (SYNTHROID, LEVOTHROID) 75 MCG tablet TAKE 1 TABLET(50 MCG) BY MOUTH DAILY 30 tablet 1  . metoprolol tartrate (LOPRESSOR) 25 MG tablet Take 0.5 tablets (12.5 mg total) by mouth 2 (two) times daily. 180 tablet 3  . midodrine (PROAMATINE) 5 MG tablet Take 1 tablet (5 mg total) by mouth 3 (three) times daily with meals. 90 tablet 0  . pantoprazole (PROTONIX) 40 MG tablet TAKE 1 TABLET(40 MG) BY MOUTH DAILY 90 tablet 3  . warfarin (COUMADIN) 2.5  MG tablet Take 1 tablet (2.5 mg total) by mouth daily at 6 PM. As directed by coumadin clinic 100 tablet 2  . rosuvastatin (CRESTOR) 20 MG tablet Take 1 tablet (20 mg total) by mouth daily. 90 tablet 3   No facility-administered medications prior to visit.     ROS Review of Systems  Constitutional: Positive for fatigue. Negative for appetite change, diaphoresis and unexpected weight change.  HENT: Negative.   Eyes: Negative.   Respiratory: Negative.  Negative for cough, chest tightness, shortness of breath and wheezing.   Cardiovascular: Negative for chest pain, palpitations and leg swelling.  Gastrointestinal: Negative for abdominal pain, constipation, diarrhea, nausea and vomiting.  Endocrine: Negative.  Negative for cold intolerance and heat intolerance.  Genitourinary: Negative.   Musculoskeletal: Negative.  Negative for back pain and myalgias.  Skin: Negative.  Negative for color change.  Allergic/Immunologic: Negative.   Neurological: Negative.  Negative for dizziness, weakness, light-headedness and numbness.  Hematological: Negative for adenopathy. Does not bruise/bleed easily.  Psychiatric/Behavioral: Negative.     Objective:  BP 134/80 (BP Location: Left Arm, Patient Position: Sitting, Cuff Size: Large)   Pulse 76   Temp 98 F (36.7 C) (Oral)   Resp 16   Ht 5\' 7"  (1.702 m)   Wt 234 lb 8 oz (106.4 kg)   SpO2 98%  BMI 36.73 kg/m   BP Readings from Last 3 Encounters:  02/24/17 134/80  02/20/17 140/80  02/11/17 109/83    Wt Readings from Last 3 Encounters:  02/24/17 234 lb 8 oz (106.4 kg)  02/20/17 237 lb (107.5 kg)  02/11/17 246 lb 6.4 oz (111.8 kg)    Physical Exam  Constitutional: She is oriented to person, place, and time. No distress.  HENT:  Mouth/Throat: Oropharynx is clear and moist. No oropharyngeal exudate.  Eyes: Conjunctivae are normal. Right eye exhibits no discharge. Left eye exhibits no discharge. No scleral icterus.  Neck: Normal range of  motion. Neck supple. No JVD present. No thyromegaly present.  Cardiovascular: Normal rate, regular rhythm and normal heart sounds. Exam reveals no gallop.  No murmur heard. Pulmonary/Chest: Effort normal and breath sounds normal. No respiratory distress. She has no rales. She exhibits no tenderness.  Abdominal: Soft. Bowel sounds are normal. She exhibits no distension. There is no rebound and no guarding.  Musculoskeletal: Normal range of motion. She exhibits no edema, tenderness or deformity.  Lymphadenopathy:    She has no cervical adenopathy.  Neurological: She is alert and oriented to person, place, and time.  Skin: Skin is warm and dry. No rash noted. She is not diaphoretic. No erythema. No pallor.  Vitals reviewed.   Lab Results  Component Value Date   WBC 7.6 02/10/2017   HGB 8.4 (L) 02/10/2017   HCT 25.4 (L) 02/10/2017   PLT 362 02/10/2017   GLUCOSE 90 02/07/2017   CHOL 110 01/26/2017   TRIG 58 01/26/2017   HDL 46 01/26/2017   LDLDIRECT 321.8 02/27/2012   LDLCALC 52 01/26/2017   ALT 9 11/03/2016   AST 15 11/03/2016   NA 137 02/07/2017   K 4.2 02/07/2017   CL 106 02/07/2017   CREATININE 1.04 (H) 02/07/2017   BUN 12 02/07/2017   CO2 24 02/07/2017   TSH 5.778 (H) 02/03/2017   INR 1.4 02/25/2017   HGBA1C 5.6 01/31/2017    No results found.  Assessment & Plan:   Ma was seen today for anemia, hypothyroidism and atrial fibrillation.  Diagnoses and all orders for this visit:  Hypothyroidism due to medication- Her most recent TSH about 3 weeks ago and it was 5.778.  She tells me her dose was increased at that time.  It is too soon today to repeat her TSH level but she agrees to return in about 2-3 months for a recheck.  For now, will continue the current dose.  Deficiency anemia- She had postop iron deficiency anemia.  She tells me she is compliant with iron replacement therapy and it is not causing any side effects.  She will have the CBC done earlier today by her  rheumatologist forwarded to me.   I am having [name] maintain her aspirin, azaTHIOprine, fluticasone, pantoprazole, rosuvastatin, calcium carbonate, acetaminophen-codeine, digoxin, levothyroxine, midodrine, warfarin, ferrous gluconate, amiodarone, and metoprolol tartrate.  No orders of the defined types were placed in this encounter.    Follow-up: Return in about 3 months (around 05/25/2017).  Scarlette Calico, MD

## 2017-02-25 ENCOUNTER — Ambulatory Visit (INDEPENDENT_AMBULATORY_CARE_PROVIDER_SITE_OTHER): Payer: BLUE CROSS/BLUE SHIELD | Admitting: Pharmacist

## 2017-02-25 DIAGNOSIS — I4891 Unspecified atrial fibrillation: Secondary | ICD-10-CM | POA: Diagnosis not present

## 2017-02-25 DIAGNOSIS — Z5181 Encounter for therapeutic drug level monitoring: Secondary | ICD-10-CM

## 2017-02-25 DIAGNOSIS — Z9889 Other specified postprocedural states: Secondary | ICD-10-CM

## 2017-02-25 LAB — POCT INR: INR: 1.4

## 2017-02-25 NOTE — Patient Instructions (Signed)
Take 2 tablets today, then start taking 1.5 tablets daily except 1 tablet on Tuesdays and Saturdays. Recheck INR in 1 week.

## 2017-02-28 ENCOUNTER — Other Ambulatory Visit: Payer: Self-pay

## 2017-02-28 ENCOUNTER — Emergency Department (HOSPITAL_COMMUNITY): Payer: BLUE CROSS/BLUE SHIELD

## 2017-02-28 ENCOUNTER — Emergency Department (HOSPITAL_COMMUNITY)
Admission: EM | Admit: 2017-02-28 | Discharge: 2017-02-28 | Disposition: A | Discharge status: Home / Self Care | Payer: BLUE CROSS/BLUE SHIELD | Attending: Emergency Medicine | Admitting: Emergency Medicine

## 2017-02-28 ENCOUNTER — Encounter (HOSPITAL_COMMUNITY): Payer: Self-pay | Admitting: Emergency Medicine

## 2017-02-28 DIAGNOSIS — Z951 Presence of aortocoronary bypass graft: Secondary | ICD-10-CM | POA: Insufficient documentation

## 2017-02-28 DIAGNOSIS — M25511 Pain in right shoulder: Secondary | ICD-10-CM | POA: Diagnosis not present

## 2017-02-28 DIAGNOSIS — Z9049 Acquired absence of other specified parts of digestive tract: Secondary | ICD-10-CM | POA: Diagnosis not present

## 2017-02-28 DIAGNOSIS — I252 Old myocardial infarction: Secondary | ICD-10-CM | POA: Diagnosis not present

## 2017-02-28 DIAGNOSIS — M546 Pain in thoracic spine: Secondary | ICD-10-CM

## 2017-02-28 DIAGNOSIS — R112 Nausea with vomiting, unspecified: Secondary | ICD-10-CM | POA: Diagnosis not present

## 2017-02-28 DIAGNOSIS — Z7901 Long term (current) use of anticoagulants: Secondary | ICD-10-CM | POA: Diagnosis not present

## 2017-02-28 DIAGNOSIS — J90 Pleural effusion, not elsewhere classified: Secondary | ICD-10-CM | POA: Insufficient documentation

## 2017-02-28 DIAGNOSIS — E039 Hypothyroidism, unspecified: Secondary | ICD-10-CM | POA: Insufficient documentation

## 2017-02-28 DIAGNOSIS — I2510 Atherosclerotic heart disease of native coronary artery without angina pectoris: Secondary | ICD-10-CM | POA: Insufficient documentation

## 2017-02-28 DIAGNOSIS — Z7982 Long term (current) use of aspirin: Secondary | ICD-10-CM | POA: Insufficient documentation

## 2017-02-28 DIAGNOSIS — R52 Pain, unspecified: Secondary | ICD-10-CM | POA: Diagnosis not present

## 2017-02-28 DIAGNOSIS — F17210 Nicotine dependence, cigarettes, uncomplicated: Secondary | ICD-10-CM | POA: Insufficient documentation

## 2017-02-28 DIAGNOSIS — Z952 Presence of prosthetic heart valve: Secondary | ICD-10-CM | POA: Diagnosis not present

## 2017-02-28 DIAGNOSIS — M549 Dorsalgia, unspecified: Secondary | ICD-10-CM | POA: Diagnosis present

## 2017-02-28 DIAGNOSIS — Z79899 Other long term (current) drug therapy: Secondary | ICD-10-CM | POA: Insufficient documentation

## 2017-02-28 LAB — CBC
HCT: 31.7 % — ABNORMAL LOW (ref 36.0–46.0)
Hemoglobin: 10.3 g/dL — ABNORMAL LOW (ref 12.0–15.0)
MCH: 32.5 pg (ref 26.0–34.0)
MCHC: 32.5 g/dL (ref 30.0–36.0)
MCV: 100 fL (ref 78.0–100.0)
Platelets: 299 10*3/uL (ref 150–400)
RBC: 3.17 MIL/uL — ABNORMAL LOW (ref 3.87–5.11)
RDW: 17 % — ABNORMAL HIGH (ref 11.5–15.5)
WBC: 7.4 10*3/uL (ref 4.0–10.5)

## 2017-02-28 LAB — BASIC METABOLIC PANEL
Anion gap: 8 (ref 5–15)
BUN: 12 mg/dL (ref 6–20)
CO2: 22 mmol/L (ref 22–32)
Calcium: 8.6 mg/dL — ABNORMAL LOW (ref 8.9–10.3)
Chloride: 107 mmol/L (ref 101–111)
Creatinine, Ser: 0.95 mg/dL (ref 0.44–1.00)
GFR calc Af Amer: >60 mL/min (ref >60)
GFR calc non Af Amer: >60 mL/min (ref >60)
Glucose, Bld: 107 mg/dL — ABNORMAL HIGH (ref 65–99)
Potassium: 4 mmol/L (ref 3.5–5.1)
Sodium: 137 mmol/L (ref 135–145)

## 2017-02-28 LAB — PROTIME-INR
INR: 1.49
Prothrombin Time: 17.9 seconds — ABNORMAL HIGH (ref 11.4–15.2)

## 2017-02-28 LAB — I-STAT TROPONIN, ED: Troponin i, poc: 0.02 ng/mL (ref 0.00–0.08)

## 2017-02-28 LAB — TROPONIN I: Troponin I: <0.03 ng/mL (ref <0.03)

## 2017-02-28 MED ORDER — ONDANSETRON 4 MG PO TBDP: 4.0000 mg | ORAL_TABLET | Freq: Three times a day (TID) | ORAL | 0 refills | Status: DC | PRN

## 2017-02-28 MED ORDER — IOPAMIDOL (ISOVUE-370) INJECTION 76%
INTRAVENOUS | Status: AC
  Administered 2017-02-28: 100 mL
  Filled 2017-02-28: qty 100

## 2017-02-28 MED ORDER — FENTANYL CITRATE (PF) 100 MCG/2ML IJ SOLN
50.0000 ug | Freq: Once | INTRAMUSCULAR | Status: DC
  Filled 2017-02-28: qty 2

## 2017-02-28 MED ORDER — MORPHINE SULFATE (PF) 4 MG/ML IV SOLN
4.0000 mg | Freq: Once | INTRAVENOUS | Status: AC
  Administered 2017-02-28: 4 mg via INTRAVENOUS
  Filled 2017-02-28: qty 1

## 2017-02-28 MED ORDER — ONDANSETRON HCL 4 MG/2ML IJ SOLN
4.0000 mg | Freq: Once | INTRAMUSCULAR | Status: AC
  Administered 2017-02-28: 4 mg via INTRAVENOUS

## 2017-02-28 MED ORDER — ONDANSETRON HCL 4 MG/2ML IJ SOLN
4.0000 mg | Freq: Once | INTRAMUSCULAR | Status: AC
Start: 2017-02-28 — End: 2017-02-28
  Administered 2017-02-28: 4 mg via INTRAVENOUS
  Filled 2017-02-28: qty 2

## 2017-02-28 MED ORDER — METOCLOPRAMIDE HCL 5 MG/ML IJ SOLN
10.0000 mg | Freq: Once | INTRAMUSCULAR | Status: AC
  Administered 2017-02-28: 10 mg via INTRAVENOUS

## 2017-02-28 NOTE — ED Notes (Signed)
PT assisted to bathroom.  

## 2017-02-28 NOTE — ED Provider Notes (Signed)
Pt seen and evaluated. Normal CT of Aorta. Post operative changes.  Thickened appendix, but no AP or TTP. Given additional zofran. Will Rx Zofran for home.    Tanna Furry, MD 02/28/17 [phone]

## 2017-02-28 NOTE — ED Triage Notes (Signed)
Pt brought to ED by [facility] for a 9/10 right upper back pain radiating to right arm and right shoulder, pt was dc from the hospital Nov 22 for similar symptoms she had a MI and got a bypass done on this hospital, per pt she is been having this pain since then but today is worse with nausea and vomiting, no SOB. 4 mg Zofran given by EMS pta./ BP 104/70, HR 82, R 19, SPO2 98% RA. Pt states she is 6/10 on arrival to ED. SR on monitor.

## 2017-02-28 NOTE — ED Provider Notes (Signed)
[facility] EMERGENCY DEPARTMENT Provider Note   CSN: [id number] Arrival date & time: 02/28/17  0422     History   Chief Complaint Chief Complaint  Patient presents with  . Back Pain    HPI [name] is a 61 y.o. female.  Patient with a history of CAD with recent CABG 01/30/17, mitral valve repair 01/23/17 presents with right back and and right shoulder pain, which were her presenting symptoms at the time of last admission resulting in CABG. She does say her pain has been persistent since discharge, but tonight became worse and was associated with nausea and vomiting. No fever, SOB, cough. She has been taking her medications regularly. No headache, near syncope or syncope. She is anticoagulated with Coumadin.   The history is provided by the patient. No language interpreter was used.  Back Pain   Pertinent negatives include no fever, no abdominal pain and no weakness.    Past Medical History:  Diagnosis Date  . Chronic ischemic heart disease, unspecified    w/ stents in mid circumflex and mid RCA in 03, and balloon angioplasty of Diagonal branch 10-03, last cath Sept 08- 2 vessel disease w/ patent stents  . Esophageal reflux   . Hypothyroidism   . Immune thrombocytopenic purpura ([place])   . Lupus erythematosus   . Mitral valve insufficiency and aortic valve insufficiency   . Myocardial infarction ([place])    x 2  . Peripheral vascular disease, unspecified ([place])   . Unspecified disease of pericardium     Patient Active Problem List   Diagnosis Date Noted  . Deficiency anemia 02/24/2017  . Encounter for therapeutic drug monitoring 02/18/2017  . Atrial fibrillation ([place]) [I48.91] 02/18/2017  . S/P CABG x 4 01/30/2017  . S/P MVR (mitral valve repair) 01/30/2017  . Coronary artery disease 01/30/2017  . Angina pectoris ([place]) 01/23/2017  . Osteoarthritis of spine with radiculopathy, lumbar region 06/24/2016  . Hypothyroidism 04/08/2011  . CAD (coronary  artery disease), native coronary artery 02/26/2009  . Mitral valve disease 02/26/2009  . Hyperlipidemia 01/26/2009  . Immune thrombocytopenic purpura ([place]) 08/05/2007  . DVT 08/05/2007  . GERD 08/05/2007  . Systemic lupus erythematosus with focal and segmental proliferative glomerulonephritis ([place]) 08/05/2007    Past Surgical History:  Procedure Laterality Date  . CHOLECYSTECTOMY, LAPAROSCOPIC  2010  . CORONARY ARTERY BYPASS GRAFT N/A 01/30/2017   Procedure: CORONARY ARTERY BYPASS GRAFTING (CABG), Times four  using the right saphaneous vein, harvested endoscopicly.  and left internal mammary artery .;  Surgeon: Gaye Pollack, MD;  Location: [facility] OR;  Service: Open Heart Surgery;  Laterality: N/A;  . LEFT HEART CATH AND CORONARY ANGIOGRAPHY N/A 01/23/2017   Procedure: LEFT HEART CATH AND CORONARY ANGIOGRAPHY;  Surgeon: Belva Crome, MD;  Location: [facility];  Service: Cardiovascular;  Laterality: N/A;  . MITRAL VALVE REPAIR N/A 01/30/2017   Procedure: MITRAL VALVE REPAIR (MVR);  Surgeon: Gaye Pollack, MD;  Location: [facility];  Service: Open Heart Surgery;  Laterality: N/A;  . plastic surgical repair      dog bite to leg  . SPLENECTOMY  5-04  . TEE WITHOUT CARDIOVERSION N/A 01/27/2017   Procedure: TRANSESOPHAGEAL ECHOCARDIOGRAM (TEE);  Surgeon: Sanda Klein, MD;  Location: [facility] ENDOSCOPY;  Service: Cardiovascular;  Laterality: N/A;  . TEE WITHOUT CARDIOVERSION N/A 01/30/2017   Procedure: TRANSESOPHAGEAL ECHOCARDIOGRAM (TEE);  Surgeon: Gaye Pollack, MD;  Location: [facility];  Service: Open Heart Surgery;  Laterality: N/A;  .  ULTRASOUND GUIDANCE FOR VASCULAR ACCESS  01/23/2017   Procedure: Ultrasound Guidance For Vascular Access;  Surgeon: Belva Crome, MD;  Location: [facility];  Service: Cardiovascular;;    OB History    No data available       Home Medications    Prior to Admission medications   Medication Sig Start Date End Date Taking? Authorizing Provider    acetaminophen-codeine (TYLENOL #3) 300-30 MG tablet Take 1 tablet by mouth every 8 (eight) hours as needed for moderate pain. 02/11/17   John Giovanni, PA-C  amiodarone (PACERONE) 200 MG tablet Take 1 tablet (200 mg total) by mouth daily. 02/20/17   Consuelo Pandy, PA-C  aspirin 81 MG chewable tablet Chew 81 mg by mouth daily.     [provider]  azaTHIOprine (IMURAN) 50 MG tablet Take 50 mg by mouth 2 (two) times daily.    [provider]  calcium carbonate (TUMS - DOSED IN MG ELEMENTAL CALCIUM) 500 MG chewable tablet Chew 1 tablet by mouth 2 (two) times daily with a meal.     [provider]  digoxin (LANOXIN) 0.25 MG tablet Take 1 tablet (0.25 mg total) by mouth daily. 02/11/17   John Giovanni, PA-C  ferrous gluconate (FERGON) 324 MG tablet Take 1 tablet (324 mg total) by mouth daily with breakfast. 02/11/17   Gold, Wayne E, PA-C  fluticasone (FLONASE) 50 MCG/ACT nasal spray Place 2 sprays into both nostrils daily. 04/17/16   Hoyt Koch, MD  levothyroxine (SYNTHROID, LEVOTHROID) 75 MCG tablet TAKE 1 TABLET(50 MCG) BY MOUTH DAILY 02/11/17   Gold, Wayne E, PA-C  metoprolol tartrate (LOPRESSOR) 25 MG tablet Take 0.5 tablets (12.5 mg total) by mouth 2 (two) times daily. 02/20/17 05/21/17  Lyda Jester M, PA-C  midodrine (PROAMATINE) 5 MG tablet Take 1 tablet (5 mg total) by mouth 3 (three) times daily with meals. 02/11/17   Gold, Wayne E, PA-C  pantoprazole (PROTONIX) 40 MG tablet TAKE 1 TABLET(40 MG) BY MOUTH DAILY 08/04/16   Burnell Blanks, MD  rosuvastatin (CRESTOR) 20 MG tablet Take 1 tablet (20 mg total) by mouth daily. 11/13/16 02/11/17  Burnell Blanks, MD  warfarin (COUMADIN) 2.5 MG tablet Take 1 tablet (2.5 mg total) by mouth daily at 6 PM. As directed by coumadin clinic 02/11/17   John Giovanni, PA-C    Family History Family History  Problem Relation Age of Onset  . Cancer Mother        breast and cervical  . Paget's  disease of bone Mother   . Fibromyalgia Mother   . Hypertension Father   . Heart disease Father        PVD  . GER disease Father   . Cancer Paternal Grandfather        colon  . Heart attack Other   . Coronary artery disease Other   . Diabetes Other   . Hypertension Other   . Hyperlipidemia Other     Social History Social History   Tobacco Use  . Smoking status: Current Some Day Smoker    Packs/day: 0.50    Years: 30.00    Pack years: 15.00    Types: Cigarettes    Last attempt to quit: 12/23/2011    Years since quitting: 5.1  . Smokeless tobacco: Never Used  Substance Use Topics  . Alcohol use: No  . Drug use: No     Allergies   Patient has no known allergies.  Review of Systems Review of Systems  Constitutional: Negative for chills, diaphoresis and fever.  HENT: Negative.   Respiratory: Negative.  Negative for cough and shortness of breath.   Cardiovascular: Negative.   Gastrointestinal: Positive for nausea and vomiting. Negative for abdominal pain.  Musculoskeletal: Positive for back pain.       Right upper back and right shoulder.  Skin: Negative.   Neurological: Negative.  Negative for syncope, weakness and light-headedness.     Physical Exam Updated Vital Signs BP 96/65   Pulse 76   Temp (!) 96.4 F (35.8 C) (Oral)   Resp 19   Ht 5\' 7"  (1.702 m)   Wt 106.1 kg (234 lb)   SpO2 95%   BMI 36.65 kg/m   Physical Exam  Constitutional: She is oriented to person, place, and time. She appears well-developed and well-nourished.  HENT:  Head: Normocephalic.  Neck: Normal range of motion. Neck supple.  Cardiovascular: Normal rate and regular rhythm.  Pulmonary/Chest: Effort normal and breath sounds normal. She has no wheezes. She has no rales.  Abdominal: Soft. Bowel sounds are normal. There is no tenderness. There is no rebound and no guarding.  Musculoskeletal: Normal range of motion. She exhibits no edema.  Back and shoulder non-tender. No swelling.    Neurological: She is alert and oriented to person, place, and time.  Skin: Skin is warm and dry. No rash noted.  Psychiatric: She has a normal mood and affect.     ED Treatments / Results  Labs (all labs ordered are listed, but only abnormal results are displayed) Labs Reviewed  BASIC METABOLIC PANEL - Abnormal; Notable for the following components:      Result Value   Glucose, Bld 107 (*)    Calcium 8.6 (*)    All other components within normal limits  CBC - Abnormal; Notable for the following components:   RBC 3.17 (*)    Hemoglobin 10.3 (*)    HCT 31.7 (*)    RDW 17.0 (*)    All other components within normal limits  PROTIME-INR  I-STAT TROPONIN, ED    EKG  EKG Interpretation  Date/Time:  Saturday February 28 2017 04:29:36 EST Ventricular Rate:  78 PR Interval:    QRS Duration: 93 QT Interval:  446 QTC Calculation: 509 R Axis:   19 Text Interpretation:  Sinus rhythm Low voltage, precordial leads Nonspecific T abnormalities, lateral leads Prolonged QT interval When compared with ECG of 02/07/2017, Nonspecific T wave abnormality is now present QT has lengthened Confirmed by Delora Fuel ([id number]) on 02/28/2017 4:36:14 AM       Radiology Dg Chest 2 View  Result Date: 02/28/2017 CLINICAL DATA:  Right upper back pain. EXAM: CHEST  2 VIEW COMPARISON:  February 07, 2017 FINDINGS: Sternotomy wires are intact. Stable cardiomegaly. The hila and mediastinum are normal. No pneumothorax. No nodules, masses, or focal infiltrates. IMPRESSION: No active cardiopulmonary disease. Electronically Signed   By: Dorise Bullion III M.D   On: 02/28/2017 05:25    Procedures Procedures (including critical care time)  Medications Ordered in ED Medications  fentaNYL (SUBLIMAZE) injection 50 mcg (not administered)  morphine 4 MG/ML injection 4 mg (4 mg Intravenous Given 02/28/17 0532)  ondansetron (ZOFRAN) injection 4 mg (4 mg Intravenous Given 02/28/17 0532)  metoCLOPramide (REGLAN)  injection 10 mg (10 mg Intravenous Given 02/28/17 0609)  iopamidol (ISOVUE-370) 76 % injection (100 mLs  Contrast Given 02/28/17 0615)     Initial Impression / Assessment and Plan /  ED Course  I have reviewed the triage vital signs and the nursing notes.  Pertinent labs & imaging results that were available during my care of the patient were reviewed by me and considered in my medical decision making (see chart for details).     Patient presents with upper back pain, shoulder pain, nausea and vomiting. Recent mitral valve repair and CABG, with persistent pain since that admission, now worse with nausea and vomiting. No fever.   EKG does not show ischemia, Troponin negative. CXR clear. She is seen and evaluated by Dr. Roxanne Mins who feels given recent procedures and persistent/worsening pain now with nausea and vomiting, CTA to rule out dissection is required.   CTA pending. Delta troponin pending. Patient care signed out to Dr. Jeneen Rinks for final disposition.  Final Clinical Impressions(s) / ED Diagnoses   Final diagnoses:  None   1. Back pain 2. Recent CABG 3. Recent MV repair  ED Discharge Orders    None       Charlann Lange, Hershal Coria 30/09/23 3007    Delora Fuel, MD 62/26/33 [phone]

## 2017-02-28 NOTE — Discharge Instructions (Signed)
Zofran as needed for nausea 

## 2017-03-18 ENCOUNTER — Ambulatory Visit (INDEPENDENT_AMBULATORY_CARE_PROVIDER_SITE_OTHER): Payer: BLUE CROSS/BLUE SHIELD | Admitting: *Deleted

## 2017-03-18 ENCOUNTER — Ambulatory Visit: Payer: BLUE CROSS/BLUE SHIELD | Admitting: Surgery

## 2017-03-18 DIAGNOSIS — I4891 Unspecified atrial fibrillation: Secondary | ICD-10-CM

## 2017-03-18 DIAGNOSIS — Z9889 Other specified postprocedural states: Secondary | ICD-10-CM

## 2017-03-18 DIAGNOSIS — Z5181 Encounter for therapeutic drug level monitoring: Secondary | ICD-10-CM

## 2017-03-18 LAB — POCT INR: INR: 1.2

## 2017-03-18 NOTE — Patient Instructions (Signed)
Description   Take 2 tablets today, then change your dose to 1.5 tablets daily except 2 tablets on Sundays.  Recheck INR in 1 week.

## 2017-03-20 ENCOUNTER — Other Ambulatory Visit: Payer: Self-pay | Admitting: Surgery

## 2017-03-20 DIAGNOSIS — Z951 Presence of aortocoronary bypass graft: Secondary | ICD-10-CM

## 2017-03-23 ENCOUNTER — Ambulatory Visit (INDEPENDENT_AMBULATORY_CARE_PROVIDER_SITE_OTHER): Payer: Self-pay | Admitting: Physician Assistant

## 2017-03-23 ENCOUNTER — Other Ambulatory Visit: Payer: Self-pay

## 2017-03-23 ENCOUNTER — Ambulatory Visit
Admission: RE | Admit: 2017-03-23 | Discharge: 2017-03-23 | Disposition: A | Discharge status: Home / Self Care | Payer: BLUE CROSS/BLUE SHIELD | Source: Ambulatory Visit | Attending: Surgery | Admitting: Surgery

## 2017-03-23 ENCOUNTER — Other Ambulatory Visit: Payer: Self-pay | Admitting: *Deleted

## 2017-03-23 VITALS — BP 100/68 | HR 80 | Ht 67.0 in | Wt 230.0 lb

## 2017-03-23 DIAGNOSIS — I2510 Atherosclerotic heart disease of native coronary artery without angina pectoris: Secondary | ICD-10-CM

## 2017-03-23 DIAGNOSIS — Z9889 Other specified postprocedural states: Secondary | ICD-10-CM

## 2017-03-23 DIAGNOSIS — Z951 Presence of aortocoronary bypass graft: Secondary | ICD-10-CM

## 2017-03-23 NOTE — Progress Notes (Addendum)
HPI:  Patient returns for routine postoperative follow-up having undergone a CABG x 4 and mitral valve repair on 01/30/2017 by Dr. Cyndia Bent. The patient's early postoperative recovery while in the hospital was notable for atrial fibrillation. She did go to the ED on 12/08 for complaints of right upper back pain radiating to her right arm and shoulder. EKG showed Nonspecific T wave abnormality is now present QT has lengthened (on Amiodarone). Troponin was <.03. CTA was NEGATIVE for aortic dissection. She did have a trace pericardial effusion, no pneumothorax, thickened appendix (but still in normal range). She was discharged later on 02/28/2017. Patient denies shortness of breath, chest pain, fever, or chills.   Current Outpatient Medications  Medication Sig Dispense Refill  . acetaminophen-codeine (TYLENOL #3) 300-30 MG tablet Take 1 tablet by mouth every 8 (eight) hours as needed for moderate pain. 30 tablet 0  . amiodarone (PACERONE) 200 MG tablet Take 1 tablet (200 mg total) by mouth daily. 90 tablet 3  . aspirin 81 MG chewable tablet Chew 81 mg by mouth daily.     Marland Kitchen azaTHIOprine (IMURAN) 50 MG tablet Take 50 mg by mouth 2 (two) times daily.    . calcium carbonate (TUMS - DOSED IN MG ELEMENTAL CALCIUM) 500 MG chewable tablet Chew 1 tablet by mouth 2 (two) times daily with a meal.     . digoxin (LANOXIN) 0.25 MG tablet Take 1 tablet (0.25 mg total) by mouth daily. 30 tablet 1  . ferrous gluconate (FERGON) 324 MG tablet Take 1 tablet (324 mg total) by mouth daily with breakfast. 30 tablet 3  . fluticasone (FLONASE) 50 MCG/ACT nasal spray Place 2 sprays into both nostrils daily. 48 g 1  . levothyroxine (SYNTHROID, LEVOTHROID) 75 MCG tablet TAKE 1 TABLET(50 MCG) BY MOUTH DAILY 30 tablet 1  . metoprolol tartrate (LOPRESSOR) 25 MG tablet Take 0.5 tablets (12.5 mg total) by mouth 2 (two) times daily. 180 tablet 3  . midodrine (PROAMATINE) 5 MG tablet Take 1 tablet (5 mg total) by mouth 3 (three) times  daily with meals. 90 tablet 0  . ondansetron (ZOFRAN ODT) 4 MG disintegrating tablet Take 1 tablet (4 mg total) by mouth every 8 (eight) hours as needed for nausea. 6 tablet 0  . pantoprazole (PROTONIX) 40 MG tablet TAKE 1 TABLET(40 MG) BY MOUTH DAILY 90 tablet 3  . warfarin (COUMADIN) 2.5 MG tablet Take 1 tablet (2.5 mg total) by mouth daily at 6 PM. As directed by coumadin clinic 100 tablet 2  . rosuvastatin (CRESTOR) 20 MG tablet Take 1 tablet (20 mg total) by mouth daily. (Patient not taking: Reported on 03/23/2017) 90 tablet 3  Vital Signs: BP 100/68, HR 80,  Oxygenation 98% on room air.   Physical Exam: CV-RRR Pulmonary-Clear to auscultation bilaterally Extremities-No LE edema Wounds-Clean and dry, no signs of infection  Diagnostic Test: CLINICAL DATA:  S/P CABG and MVR 01/30/17, there are no current chest complaints (patient going to tcts now)  EXAM: CHEST  2 VIEW  COMPARISON:  02/28/2017  FINDINGS: Prior median sternotomy. Midline trachea. Normal heart size and mediastinal contours. No pleural effusion or pneumothorax. Mild biapical pleural thickening. Clear lungs.  IMPRESSION: No acute cardiopulmonary disease.   Electronically Signed   By: Abigail Miyamoto M.D.   On: 03/23/2017 13:36  Impression and Plan:  Overall, Ms. Doyel is recovering well from coronary artery bypass grafting surgery and mitral valve repair. She has already seen a physician assistant with cardiology on 02/20/2017.  She put her  on Lopressor 25 mg bid. Her last INR was drawn on 03/18/2017 and was 1.2. She has an appointment on 03/25/2017 to have it drawn again. Regarding her medication, I have told her to stop both Midodrine and Digoixn. She stated she has done that by herself. She states her SBP is usually hight than 120 and denies dizziness. Amiodarone likely can be stopped at her next cardiology appointment. Regarding Coumadin, she was on this for mitral valve repair and post op atrial fibrillation.  Hopefully, this can be stopped 2-3 months after her surgery. I also informed her that she may resume her Plaquenil (she takes for Lupus) as there is not an interaction with Coumadin (I checked with [place]). She has already been driving. She wishes to participate in cardiac rehab which I will ask my nurse to refer her. Finally, she was instructed she is to continue with sternal precautions (I.e. No lifting more than 10-15 pounds) until 04/01/2017. She may then resume normal activities. She has a follow up to see Dr. Tamala Julian on 04/09/2017 at 9:20 am. We discussed the longevity of the LIMA and greater saphenous vein grafts. I later contacted the patient to inform her that anytime she has a dental procedure, she will need antibiotics as she has had a valve repair (to avoid endocarditis). She wishes to return to see Dr. Cyndia Bent at the end of the month so we will bring her back then.   Nani Skillern, PA-C [facility] [phone]

## 2017-03-25 ENCOUNTER — Telehealth (HOSPITAL_COMMUNITY): Payer: Self-pay

## 2017-03-25 NOTE — Telephone Encounter (Signed)
Called and spoke with patient in regards to Cardiac Rehab - Patient is interested in the program. Scheduled orientation on 04/23/2017 at 8:45am. Patient will attend the 1:15 PM Exc class.

## 2017-04-06 ENCOUNTER — Ambulatory Visit (INDEPENDENT_AMBULATORY_CARE_PROVIDER_SITE_OTHER): Payer: BLUE CROSS/BLUE SHIELD | Admitting: *Deleted

## 2017-04-06 DIAGNOSIS — Z9889 Other specified postprocedural states: Secondary | ICD-10-CM | POA: Diagnosis not present

## 2017-04-06 DIAGNOSIS — I4891 Unspecified atrial fibrillation: Secondary | ICD-10-CM | POA: Diagnosis not present

## 2017-04-06 DIAGNOSIS — Z5181 Encounter for therapeutic drug level monitoring: Secondary | ICD-10-CM | POA: Diagnosis not present

## 2017-04-06 LAB — POCT INR: INR: 1.1

## 2017-04-06 NOTE — Patient Instructions (Signed)
Description   Take 2 tablets today, then change your dose to 2 tablets daily except 1.5 tablets on Fridays and Monday.  Recheck INR  on Thursday with Dr. Tamala Julian appt

## 2017-04-08 DIAGNOSIS — Z79899 Other long term (current) drug therapy: Secondary | ICD-10-CM | POA: Diagnosis not present

## 2017-04-08 DIAGNOSIS — E669 Obesity, unspecified: Secondary | ICD-10-CM | POA: Diagnosis not present

## 2017-04-08 DIAGNOSIS — R3911 Hesitancy of micturition: Secondary | ICD-10-CM | POA: Diagnosis not present

## 2017-04-08 DIAGNOSIS — Z6838 Body mass index (BMI) 38.0-38.9, adult: Secondary | ICD-10-CM | POA: Diagnosis not present

## 2017-04-08 DIAGNOSIS — M3214 Glomerular disease in systemic lupus erythematosus: Secondary | ICD-10-CM | POA: Diagnosis not present

## 2017-04-08 DIAGNOSIS — M329 Systemic lupus erythematosus, unspecified: Secondary | ICD-10-CM | POA: Diagnosis not present

## 2017-04-09 ENCOUNTER — Encounter: Payer: Self-pay | Admitting: *Deleted

## 2017-04-09 ENCOUNTER — Ambulatory Visit (INDEPENDENT_AMBULATORY_CARE_PROVIDER_SITE_OTHER): Payer: BLUE CROSS/BLUE SHIELD | Admitting: Interventional Cardiology

## 2017-04-09 ENCOUNTER — Ambulatory Visit (INDEPENDENT_AMBULATORY_CARE_PROVIDER_SITE_OTHER): Payer: BLUE CROSS/BLUE SHIELD | Admitting: Pharmacist

## 2017-04-09 ENCOUNTER — Encounter: Payer: Self-pay | Admitting: Interventional Cardiology

## 2017-04-09 VITALS — BP 100/68 | HR 83 | Ht 67.0 in | Wt 233.8 lb

## 2017-04-09 DIAGNOSIS — I4891 Unspecified atrial fibrillation: Secondary | ICD-10-CM

## 2017-04-09 DIAGNOSIS — Z9889 Other specified postprocedural states: Secondary | ICD-10-CM | POA: Diagnosis not present

## 2017-04-09 DIAGNOSIS — I9789 Other postprocedural complications and disorders of the circulatory system, not elsewhere classified: Secondary | ICD-10-CM

## 2017-04-09 DIAGNOSIS — Z5181 Encounter for therapeutic drug level monitoring: Secondary | ICD-10-CM

## 2017-04-09 DIAGNOSIS — I25709 Atherosclerosis of coronary artery bypass graft(s), unspecified, with unspecified angina pectoris: Secondary | ICD-10-CM

## 2017-04-09 DIAGNOSIS — I209 Angina pectoris, unspecified: Secondary | ICD-10-CM

## 2017-04-09 DIAGNOSIS — E782 Mixed hyperlipidemia: Secondary | ICD-10-CM

## 2017-04-09 DIAGNOSIS — M3214 Glomerular disease in systemic lupus erythematosus: Secondary | ICD-10-CM

## 2017-04-09 LAB — POCT INR: INR: 1.1

## 2017-04-09 NOTE — Progress Notes (Signed)
Cardiology Office Note    Date:  04/09/2017   ID:  [name], DOB May 05, 1955, MRN [id number]  PCP:  Janith Lima, MD  Cardiologist: Sinclair Grooms, MD   No chief complaint on file.   History of Present Illness:  [name] is a 62 y.o. female with lupus erythematosus, coronary artery disease with prior history of stenting and ultimate recent four-vessel coronary bypass grafting 2018, mitral valve annuloplasty at the time of CABG in 2018, postoperative atrial fibrillation, and PAD.  Feels better.  No chest discomfort or recurrent symptomatic atrial fibrillation episodes.  Without our consent she is discontinued amiodarone therapy.  She denies orthopnea, PND, chest pain, chills, fever, and edema.  Multiple questions some of which are tangential.  Significant time was spent explaining concerns about her prior stents from greater than 10 years ago, durability of vein bypass grafts, dental work timing, explaining the at this point in efficiency of Coumadin therapy.  Past Medical History:  Diagnosis Date  . Chronic ischemic heart disease, unspecified    w/ stents in mid circumflex and mid RCA in 03, and balloon angioplasty of Diagonal branch 10-03, last cath Sept 08- 2 vessel disease w/ patent stents  . Esophageal reflux   . Hypothyroidism   . Immune thrombocytopenic purpura ([name])   . Lupus erythematosus   . Mitral valve insufficiency and aortic valve insufficiency   . Myocardial infarction ([name])    x 2  . Peripheral vascular disease, unspecified ([name])   . Unspecified disease of pericardium     Past Surgical History:  Procedure Laterality Date  . CHOLECYSTECTOMY, LAPAROSCOPIC  2010  . CORONARY ARTERY BYPASS GRAFT N/A 01/30/2017   Procedure: CORONARY ARTERY BYPASS GRAFTING (CABG), Times four  using the right saphaneous vein, harvested endoscopicly.  and left internal mammary artery .;  Surgeon: Gaye Pollack, MD;  Location: [facility] OR;  Service: Open Heart Surgery;   Laterality: N/A;  . LEFT HEART CATH AND CORONARY ANGIOGRAPHY N/A 01/23/2017   Procedure: LEFT HEART CATH AND CORONARY ANGIOGRAPHY;  Surgeon: Belva Crome, MD;  Location: [facility];  Service: Cardiovascular;  Laterality: N/A;  . MITRAL VALVE REPAIR N/A 01/30/2017   Procedure: MITRAL VALVE REPAIR (MVR);  Surgeon: Gaye Pollack, MD;  Location: [facility];  Service: Open Heart Surgery;  Laterality: N/A;  . plastic surgical repair      dog bite to leg  . SPLENECTOMY  5-04  . TEE WITHOUT CARDIOVERSION N/A 01/27/2017   Procedure: TRANSESOPHAGEAL ECHOCARDIOGRAM (TEE);  Surgeon: Sanda Klein, MD;  Location: [facility] ENDOSCOPY;  Service: Cardiovascular;  Laterality: N/A;  . TEE WITHOUT CARDIOVERSION N/A 01/30/2017   Procedure: TRANSESOPHAGEAL ECHOCARDIOGRAM (TEE);  Surgeon: Gaye Pollack, MD;  Location: [facility];  Service: Open Heart Surgery;  Laterality: N/A;  . ULTRASOUND GUIDANCE FOR VASCULAR ACCESS  01/23/2017   Procedure: Ultrasound Guidance For Vascular Access;  Surgeon: Belva Crome, MD;  Location: [facility];  Service: Cardiovascular;;    Current Medications: Outpatient Medications Prior to Visit  Medication Sig Dispense Refill  . acetaminophen-codeine (TYLENOL #3) 300-30 MG tablet Take 1 tablet by mouth every 8 (eight) hours as needed for moderate pain. 30 tablet 0  . aspirin 81 MG chewable tablet Chew 81 mg by mouth daily.     Marland Kitchen azaTHIOprine (IMURAN) 50 MG tablet Take 50 mg by mouth 2 (two) times daily.    . calcium carbonate (TUMS - DOSED IN MG ELEMENTAL CALCIUM) 500  MG chewable tablet Chew 1 tablet by mouth 2 (two) times daily with a meal.     . levothyroxine (SYNTHROID, LEVOTHROID) 75 MCG tablet TAKE 1 TABLET(50 MCG) BY MOUTH DAILY 30 tablet 1  . metoprolol tartrate (LOPRESSOR) 25 MG tablet Take 0.5 tablets (12.5 mg total) by mouth 2 (two) times daily. 180 tablet 3  . ondansetron (ZOFRAN ODT) 4 MG disintegrating tablet Take 1 tablet (4 mg total) by mouth every 8 (eight) hours as  needed for nausea. 6 tablet 0  . pantoprazole (PROTONIX) 40 MG tablet TAKE 1 TABLET(40 MG) BY MOUTH DAILY 90 tablet 3  . warfarin (COUMADIN) 2.5 MG tablet Take 1 tablet (2.5 mg total) by mouth daily at 6 PM. As directed by coumadin clinic 100 tablet 2  . rosuvastatin (CRESTOR) 20 MG tablet Take 1 tablet (20 mg total) by mouth daily. (Patient not taking: Reported on 03/23/2017) 90 tablet 3   No facility-administered medications prior to visit.      Allergies:   Patient has no known allergies.   Social History   Socioeconomic History  . Marital status: Single    Spouse name: None  . Number of children: None  . Years of education: None  . Highest education level: None  Social Needs  . Financial resource strain: None  . Food insecurity - worry: None  . Food insecurity - inability: None  . Transportation needs - medical: None  . Transportation needs - non-medical: None  Occupational History  . None  Tobacco Use  . Smoking status: Former Smoker    Packs/day: 0.50    Years: 30.00    Pack years: 15.00    Types: Cigarettes    Last attempt to quit: 12/23/2011    Years since quitting: 5.2  . Smokeless tobacco: Never Used  Substance and Sexual Activity  . Alcohol use: No  . Drug use: No  . Sexual activity: No  Other Topics Concern  . None  Social History Narrative   HSG,  graduated from [name] in [place]. In college [name] -grad '13 with relgious studies. [employer] - Fall '13 for MA-divinity. Marrried '73 - 2 years/ divorced. 2 son- '73, '75 - CP.    Occupation: full-time Ship broker. She lives alone with her younger son living with her part-time but he resides in a managed care facility.      Family History:  The patient's family history includes Cancer in her mother and paternal grandfather; Coronary artery disease in her other; Diabetes in her other; Fibromyalgia in her mother; GER disease in her father; Heart attack in her other; Heart disease in her father;  Hyperlipidemia in her other; Hypertension in her father and other; Paget's disease of bone in her mother.   ROS:   Please see the history of present illness.    Lupus is under control. All other systems reviewed and are negative.   PHYSICAL EXAM:   VS:  BP 100/68   Pulse 83   Ht 5\' 7"  (1.702 m)   Wt 233 lb 12.8 oz (106.1 kg)   BMI 36.62 kg/m    GEN: Well nourished, well developed, in no acute distress.  Obese but has lost significant weight. HEENT: normal  Neck: no JVD, carotid bruits, or masses Cardiac: RRR; no murmurs, rubs, or gallops,no edema  Respiratory:  clear to auscultation bilaterally, normal work of breathing GI: soft, nontender, nondistended, + BS MS: no deformity or atrophy  Skin: warm and dry, no rash Neuro:  Alert  and Oriented x 3, Strength and sensation are intact Psych: euthymic mood, full affect  Wt Readings from Last 3 Encounters:  04/09/17 233 lb 12.8 oz (106.1 kg)  03/23/17 230 lb (104.3 kg)  02/28/17 234 lb (106.1 kg)      Studies/Labs Reviewed:   EKG:  EKG normal sinus rhythm with nonspecific T wave flattening.  Overall unremarkable.  Recent Labs: 11/03/2016: ALT 9 02/03/2017: Magnesium 2.4; TSH 5.778 02/28/2017: BUN 12; Creatinine, Ser 0.95; Hemoglobin 10.3; Platelets 299; Potassium 4.0; Sodium 137   Lipid Panel    Component Value Date/Time   CHOL 110 01/26/2017 0301   CHOL 201 (H) 11/03/2016 1155   TRIG 58 01/26/2017 0301   HDL 46 01/26/2017 0301   HDL 58 11/03/2016 1155   CHOLHDL 2.4 01/26/2017 0301   VLDL 12 01/26/2017 0301   LDLCALC 52 01/26/2017 0301   LDLCALC 129 (H) 11/03/2016 1155   LDLDIRECT 321.8 02/27/2012 1202    Additional studies/ records that were reviewed today include:  No new data    ASSESSMENT:    1. S/P MVR (mitral valve repair)   2. Coronary artery disease involving coronary bypass graft of native heart with angina pectoris ([place])   3. Mixed hyperlipidemia   4. Postoperative atrial fibrillation ([facility])   5.  Encounter for therapeutic drug monitoring   6. Systemic lupus erythematosus with focal and segmental proliferative glomerulonephritis ([place])      PLAN:  In order of problems listed above:  1. No mitral regurgitation on auscultation.  At 1 year post surgery will repeat an echocardiogram. 2. Asymptomatic after four-vessel coronary bypass. 3. LDL target should be less than 70.  Most recent data LDL was 52. 4. No clinical recurrence of atrial fibrillation.  Patient discontinued amiodarone without our consent.  The medication will not be resumed.  In 2 months she will have a 48-hour Holter to rule out subclinical paroxysmal A. fib and if negative atrial fibrillation Coumadin will be discontinued. 5. Followed in Coumadin clinic. 6. Followed by Dr. Gavin Pound.  Clinical follow-up in 3 months.    Medication Adjustments/Labs and Tests Ordered: Current medicines are reviewed at length with the patient today.  Concerns regarding medicines are outlined above.  Medication changes, Labs and Tests ordered today are listed in the Patient Instructions below. Patient Instructions  Medication Instructions:  Your physician recommends that you continue on your current medications as directed. Please refer to the Current Medication list given to you today.  Labwork: None  Testing/Procedures: Your physician has recommended that you wear a 48 hour holter monitor in 2 months. Holter monitors are medical devices that record the heart's electrical activity. Doctors most often use these monitors to diagnose arrhythmias. Arrhythmias are problems with the speed or rhythm of the heartbeat. The monitor is a small, portable device. You can wear one while you do your normal daily activities. This is usually used to diagnose what is causing palpitations/syncope (passing out).    Follow-Up: Your physician recommends that you schedule a follow-up appointment in: 2.5-3 months with Dr. Tamala Julian. (Needs to be after monitor  is completed.)   Any Other Special Instructions Will Be Listed Below (If Applicable).     If you need a refill on your cardiac medications before your next appointment, please call your pharmacy.      Signed, Sinclair Grooms, MD  04/09/2017 10:30 AM    [facility] [address], [place],   [address] Phone: [phone]; Fax: [phone]  

## 2017-04-09 NOTE — Patient Instructions (Signed)
Description   Take 2.5 tablets today and 2 tablets tomorrow then continue 2 tablets daily except 1.5 tablets on Fridays and Monday.  Recheck INR  In 1 week.

## 2017-04-09 NOTE — Patient Instructions (Signed)
Medication Instructions:  Your physician recommends that you continue on your current medications as directed. Please refer to the Current Medication list given to you today.  Labwork: None  Testing/Procedures: Your physician has recommended that you wear a 48 hour holter monitor in 2 months. Holter monitors are medical devices that record the heart's electrical activity. Doctors most often use these monitors to diagnose arrhythmias. Arrhythmias are problems with the speed or rhythm of the heartbeat. The monitor is a small, portable device. You can wear one while you do your normal daily activities. This is usually used to diagnose what is causing palpitations/syncope (passing out).    Follow-Up: Your physician recommends that you schedule a follow-up appointment in: 2.5-3 months with Dr. Tamala Julian. (Needs to be after monitor is completed.)   Any Other Special Instructions Will Be Listed Below (If Applicable).     If you need a refill on your cardiac medications before your next appointment, please call your pharmacy.

## 2017-04-15 ENCOUNTER — Ambulatory Visit (INDEPENDENT_AMBULATORY_CARE_PROVIDER_SITE_OTHER): Payer: BLUE CROSS/BLUE SHIELD

## 2017-04-15 DIAGNOSIS — Z111 Encounter for screening for respiratory tuberculosis: Secondary | ICD-10-CM | POA: Diagnosis not present

## 2017-04-16 ENCOUNTER — Ambulatory Visit (INDEPENDENT_AMBULATORY_CARE_PROVIDER_SITE_OTHER): Payer: BLUE CROSS/BLUE SHIELD | Admitting: *Deleted

## 2017-04-16 DIAGNOSIS — I4891 Unspecified atrial fibrillation: Secondary | ICD-10-CM | POA: Diagnosis not present

## 2017-04-16 DIAGNOSIS — Z9889 Other specified postprocedural states: Secondary | ICD-10-CM | POA: Diagnosis not present

## 2017-04-16 DIAGNOSIS — I9789 Other postprocedural complications and disorders of the circulatory system, not elsewhere classified: Secondary | ICD-10-CM

## 2017-04-16 DIAGNOSIS — Z951 Presence of aortocoronary bypass graft: Secondary | ICD-10-CM | POA: Diagnosis not present

## 2017-04-16 DIAGNOSIS — Z5181 Encounter for therapeutic drug level monitoring: Secondary | ICD-10-CM

## 2017-04-16 LAB — POCT INR: INR: 1.4

## 2017-04-16 MED ORDER — WARFARIN SODIUM 2.5 MG PO TABS: ORAL_TABLET | ORAL | 0 refills | Status: DC

## 2017-04-16 NOTE — Patient Instructions (Signed)
Description   Today Jan 24th take 2 and 1/2 tablets (6.25mg ) then tomorrow take 2 tablets (5mg ) then change dose of coumadin to 2 tablets (5mg )  daily except 1.5 tablets(3.75mg )  only on Fridays   Recheck INR  In 1 week.

## 2017-04-17 ENCOUNTER — Encounter: Payer: Self-pay | Admitting: *Deleted

## 2017-04-17 ENCOUNTER — Other Ambulatory Visit: Payer: Self-pay | Admitting: *Deleted

## 2017-04-17 LAB — TB SKIN TEST
Induration: 0 mm
TB Skin Test: NEGATIVE

## 2017-04-17 MED ORDER — LEVOTHYROXINE SODIUM 75 MCG PO TABS: ORAL_TABLET | ORAL | 0 refills | Status: DC

## 2017-04-21 ENCOUNTER — Telehealth (HOSPITAL_COMMUNITY): Payer: Self-pay

## 2017-04-22 ENCOUNTER — Ambulatory Visit: Payer: Self-pay | Admitting: Surgery

## 2017-04-22 NOTE — Telephone Encounter (Signed)
Cardiac Rehab Medication Review by a Pharmacist  Does the patient  feel that his/her medications are working for him/her?  yes  Has the patient been experiencing any side effects to the medications prescribed?  no  Does the patient measure his/her own blood pressure or blood glucose at home?  no   Does the patient have any problems obtaining medications due to transportation or finances?   no  Understanding of regimen: fair Understanding of indications: fair Potential of compliance: fair    Pharmacist comments: Ms. Pingley is a 62 y/o female who I spoke with regarding her medication history prior to her cardiac rehab appointment. She reports taking hydroxychloroquine daily which was not on her medication history, but I could find a mention of it in some prior visit notes. She denies any side effects or concerns with her medications.   Patterson Hammersmith PharmD PGY1 Pharmacy Practice Resident 04/22/2017 4:35 PM

## 2017-04-22 NOTE — Progress Notes (Unsigned)
This encounter was created in error - please disregard.

## 2017-04-23 ENCOUNTER — Encounter (HOSPITAL_COMMUNITY)
Admission: RE | Admit: 2017-04-23 | Discharge: 2017-04-23 | Disposition: A | Discharge status: Home / Self Care | Payer: BLUE CROSS/BLUE SHIELD | Source: Ambulatory Visit | Attending: Cardiovascular Disease | Admitting: Cardiovascular Disease

## 2017-04-23 ENCOUNTER — Ambulatory Visit (INDEPENDENT_AMBULATORY_CARE_PROVIDER_SITE_OTHER): Payer: BLUE CROSS/BLUE SHIELD | Admitting: *Deleted

## 2017-04-23 VITALS — Ht 65.5 in | Wt 235.5 lb

## 2017-04-23 DIAGNOSIS — I9789 Other postprocedural complications and disorders of the circulatory system, not elsewhere classified: Secondary | ICD-10-CM

## 2017-04-23 DIAGNOSIS — Z9889 Other specified postprocedural states: Secondary | ICD-10-CM | POA: Diagnosis not present

## 2017-04-23 DIAGNOSIS — Z951 Presence of aortocoronary bypass graft: Secondary | ICD-10-CM

## 2017-04-23 DIAGNOSIS — Z5181 Encounter for therapeutic drug level monitoring: Secondary | ICD-10-CM

## 2017-04-23 DIAGNOSIS — I4891 Unspecified atrial fibrillation: Secondary | ICD-10-CM

## 2017-04-23 LAB — POCT INR: INR: 1.4

## 2017-04-23 NOTE — Progress Notes (Addendum)
[name] 62 y.o. female DOB: 04-Aug-1955 MRN: [id number]      Nutrition Note  Dx: CABG, MV repair Past Medical History:  Diagnosis Date  . Chronic ischemic heart disease, unspecified    w/ stents in mid circumflex and mid RCA in 03, and balloon angioplasty of Diagonal branch 10-03, last cath Sept 08- 2 vessel disease w/ patent stents  . Esophageal reflux   . Hypothyroidism   . Immune thrombocytopenic purpura ([place])   . Lupus erythematosus   . Mitral valve insufficiency and aortic valve insufficiency   . Myocardial infarction ([place])    x 2  . Peripheral vascular disease, unspecified ([place])   . Unspecified disease of pericardium    Meds reviewed. Coumadin noted  HT: Ht Readings from Last 1 Encounters:  04/09/17 5\' 7"  (1.702 m)    WT: Wt Readings from Last 3 Encounters:  04/09/17 233 lb 12.8 oz (106.1 kg)  03/23/17 230 lb (104.3 kg)  02/28/17 234 lb (106.1 kg)     BMI 36.6   Current tobacco use? No   Labs:  Lipid Panel     Component Value Date/Time   CHOL 110 01/26/2017 0301   CHOL 201 (H) 11/03/2016 1155   TRIG 58 01/26/2017 0301   HDL 46 01/26/2017 0301   HDL 58 11/03/2016 1155   CHOLHDL 2.4 01/26/2017 0301   VLDL 12 01/26/2017 0301   LDLCALC 52 01/26/2017 0301   LDLCALC 129 (H) 11/03/2016 1155   LDLDIRECT 321.8 02/27/2012 1202    Lab Results  Component Value Date   HGBA1C 5.6 01/31/2017   CBG (last 3)  No results for input(s): GLUCAP in the last 72 hours.  Nutrition Note Spoke with pt. Nutrition plan and goals reviewed with pt. Pt is following Step 1 of the Therapeutic Lifestyle Changes diet. Pt wants to lose wt. Pt states she has lost 20 lb over the past 3 months post-op due to decreased appetite. Pt wants to lose 40-50 lb more. Wt loss tips reviewed. Pt reports she eats out 4-5 times/week due to difficulty cooking for 1 person. Ways to manage meal prep for 1 person discussed. Pt is on Coumadin. Pt was unsure of how vitamin K affected Coumadin levels. Pt  educated re: consistent vitamin K intake. Pt expressed understanding of the information reviewed. Pt aware of nutrition education classes offered and plans on attending nutrition classes.  Nutrition Diagnosis ? Food-and nutrition-related knowledge deficit related to lack of exposure to information as related to diagnosis of: ? CVD ? Obesity related to excessive energy intake as evidenced by a BMI of 36.6  Nutrition Intervention ? Handout given for Consistent Vitamin K intake.  ? Pt's individual nutrition plan and goals reviewed with pt.  Nutrition Goal(s):  ? Pt to identify and limit food sources of saturated fat, trans fat, and sodium ? Pt wants to increase meals eaten at home and learn to prepare foods in smaller quantities. ? Pt to identify food quantities necessary to achieve weight loss of 6-24 lb (2.7-10.9 kg) at graduation from cardiac rehab. Long term wt loss goal of 40-50 lb desired.  Plan:  Pt to attend nutrition classes ? Nutrition I ? Nutrition II ? Portion Distortion  Will provide client-centered nutrition education as part of interdisciplinary care.   Monitor and evaluate progress toward nutrition goal with team.  Derek Mound, M.Ed, RD, LDN, CDE 04/23/2017 9:42 AM

## 2017-04-23 NOTE — Patient Instructions (Signed)
Description   Today take 3 tablets, then start taking 2 tablets daily except 3 tablets on Mondays, Wednesdays and Fridays.   Recheck INR  In 1 week.

## 2017-04-23 NOTE — Progress Notes (Signed)
Cardiac Individual Treatment Plan  Patient Details  Name: VERDA MEHTA MRN: [id number] Date of Birth: [birth year] Referring Provider:     [place] from 04/23/2017 in [place]  Referring Provider  Lauree Chandler D MD      Initial Encounter Date:    CARDIAC REHAB PHASE II ORIENTATION from 04/23/2017 in [place]  Date  04/23/17  Referring Provider  Lauree Chandler D MD      Visit Diagnosis: S/P CABG x 4  S/P MVR (mitral valve repair)  Patient's Home Medications on Admission:  Current Outpatient Medications:  .  acetaminophen-codeine (TYLENOL #3) 300-30 MG tablet, Take 1 tablet by mouth every 8 (eight) hours as needed for moderate pain. (Patient not taking: Reported on 04/22/2017), Disp: 30 tablet, Rfl: 0 .  aspirin 81 MG chewable tablet, Chew 81 mg by mouth daily. , Disp: , Rfl:  .  Aspirin-Acetaminophen-Caffeine (GOODY HEADACHE PO), Take 1 packet by mouth daily as needed., Disp: , Rfl:  .  azaTHIOprine (IMURAN) 50 MG tablet, Take 50 mg by mouth 2 (two) times daily., Disp: , Rfl:  .  calcium carbonate (TUMS - DOSED IN MG ELEMENTAL CALCIUM) 500 MG chewable tablet, Chew 1 tablet by mouth 2 (two) times daily with a meal. , Disp: , Rfl:  .  hydroxychloroquine (PLAQUENIL) 200 MG tablet, Take 200 mg by mouth daily. , Disp: , Rfl:  .  levothyroxine (SYNTHROID, LEVOTHROID) 75 MCG tablet, TAKE 1 TABLET(50 MCG) BY MOUTH DAILY, Disp: 90 tablet, Rfl: 0 .  metoprolol tartrate (LOPRESSOR) 25 MG tablet, Take 0.5 tablets (12.5 mg total) by mouth 2 (two) times daily., Disp: 180 tablet, Rfl: 3 .  ondansetron (ZOFRAN ODT) 4 MG disintegrating tablet, Take 1 tablet (4 mg total) by mouth every 8 (eight) hours as needed for nausea. (Patient not taking: Reported on 04/22/2017), Disp: 6 tablet, Rfl: 0 .  pantoprazole (PROTONIX) 40 MG tablet, TAKE 1 TABLET(40 MG) BY MOUTH DAILY, Disp: 90 tablet, Rfl: 3 .   rosuvastatin (CRESTOR) 20 MG tablet, Take 1 tablet (20 mg total) by mouth daily. (Patient not taking: Reported on 03/23/2017), Disp: 90 tablet, Rfl: 3 .  warfarin (COUMADIN) 2.5 MG tablet, As directed by coumadin clinic, Disp: 70 tablet, Rfl: 0  Past Medical History: Past Medical History:  Diagnosis Date  . Chronic ischemic heart disease, unspecified    w/ stents in mid circumflex and mid RCA in 03, and balloon angioplasty of Diagonal branch 10-03, last cath Sept 08- 2 vessel disease w/ patent stents  . Esophageal reflux   . Hypothyroidism   . Immune thrombocytopenic purpura ([place])   . Lupus erythematosus   . Mitral valve insufficiency and aortic valve insufficiency   . Myocardial infarction ([place])    x 2  . Peripheral vascular disease, unspecified ([place])   . Unspecified disease of pericardium     Tobacco Use: Social History   Tobacco Use  Smoking Status Former Smoker  . Packs/day: 0.50  . Years: 30.00  . Pack years: 15.00  . Types: Cigarettes  . Last attempt to quit: 12/23/2011  . Years since quitting: 5.3  Smokeless Tobacco Never Used    Labs: Recent Review Flowsheet Data    Labs for ITP Cardiac and Pulmonary Rehab Latest Ref Rng & Units 01/30/2017 01/30/2017 01/30/2017 01/30/2017 01/31/2017   Cholestrol 0 - 200 mg/dL - - - - -   LDLCALC 0 - 99 mg/dL - - - - -  LDLDIRECT mg/dL - - - - -   HDL >40 mg/dL - - - - -   Trlycerides <150 mg/dL - - - - -   Hemoglobin A1c 4.8 - 5.6 % - - - - 5.6   PHART 7.350 - 7.450 7.374 - 7.400 7.371 -   PCO2ART 32.0 - 48.0 mmHg 35.0 - 32.9 37.9 -   HCO3 20.0 - 28.0 mmol/L 20.5 - 20.3 22.0 -   TCO2 22 - 32 mmol/L 22 20(L) 21(L) 23 23   ACIDBASEDEF 0.0 - 2.0 mmol/L 4.0(H) - 4.0(H) 3.0(H) -   O2SAT % 93.0 - 89.0 98.0 -      Capillary Blood Glucose: Lab Results  Component Value Date   GLUCAP 106 (H) 02/06/2017   GLUCAP 103 (H) 02/06/2017   GLUCAP 110 (H) 02/05/2017   GLUCAP 88 02/05/2017   GLUCAP 86 02/05/2017     Exercise Target  Goals: Date: 04/23/17  Exercise Program Goal: Individual exercise prescription set using results from initial 6 min walk test and THRR while considering  patient's activity barriers and safety.   Exercise Prescription Goal: Initial exercise prescription builds to 30-45 minutes a day of aerobic activity, 2-3 days per week.  Home exercise guidelines will be given to patient during program as part of exercise prescription that the participant will acknowledge.  Activity Barriers & Risk Stratification: Activity Barriers & Cardiac Risk Stratification - 04/23/17 1102      Activity Barriers & Cardiac Risk Stratification   Comments  Sciatica, Lupus        6 Minute Walk: 6 Minute Walk    Row Name 04/23/17 1026 04/23/17 1053       6 Minute Walk   Phase  Initial  -    Distance  1302 feet  -    Walk Time  6 minutes  -    # of Rest Breaks  0  -    METS  1.74  -    RPE  9  -    Perceived Dyspnea   0  -    VO2 Peak  6.09  -    Symptoms  No  -    Resting HR  58 bpm  -    Resting BP  120/62  -    Resting Oxygen Saturation   99 %  -    Exercise Oxygen Saturation  during 6 min walk  99 %  -    Max Ex. HR  99 bpm  -    Max Ex. BP  102/78  -    2 Minute Post BP  -  102/72       Oxygen Initial Assessment:   Oxygen Re-Evaluation:   Oxygen Discharge (Final Oxygen Re-Evaluation):   Initial Exercise Prescription: Initial Exercise Prescription - 04/23/17 1000      Date of Initial Exercise RX and Referring Provider   Date  04/23/17    Referring Provider  Lauree Chandler D MD      Treadmill   MPH  1    Grade  0    Minutes  10    METs  1.77      NuStep   Level  2    SPM  70    Minutes  10    METs  1.8      Track   Laps  9    Minutes  10    METs  2.53      Prescription Details   Frequency (  times per week)  3x    Duration  Progress to 30 minutes of continuous aerobic without signs/symptoms of physical distress      Intensity   THRR 40-80% of Max Heartrate  64-127     Ratings of Perceived Exertion  11-13    Perceived Dyspnea  0-4      Progression   Progression  Continue progressive overload as per policy without signs/symptoms or physical distress.      Resistance Training   Training Prescription  Yes    Weight  2lbs    Reps  10-15       Perform Capillary Blood Glucose checks as needed.  Exercise Prescription Changes:   Exercise Comments:   Exercise Goals and Review: Exercise Goals    Row Name 04/23/17 0930             Exercise Goals   Increase Physical Activity  Yes       Intervention  Provide advice, education, support and counseling about physical activity/exercise needs.;Develop an individualized exercise prescription for aerobic and resistive training based on initial evaluation findings, risk stratification, comorbidities and participant's personal goals.       Expected Outcomes  Long Term: Exercising regularly at least 3-5 days a week.;Short Term: Attend rehab on a regular basis to increase amount of physical activity.;Long Term: Add in home exercise to make exercise part of routine and to increase amount of physical activity.       Increase Strength and Stamina  Yes       Intervention  Provide advice, education, support and counseling about physical activity/exercise needs.;Develop an individualized exercise prescription for aerobic and resistive training based on initial evaluation findings, risk stratification, comorbidities and participant's personal goals.       Expected Outcomes  Short Term: Perform resistance training exercises routinely during rehab and add in resistance training at home;Long Term: Improve cardiorespiratory fitness, muscular endurance and strength as measured by increased METs and functional capacity (6MWT);Short Term: Increase workloads from initial exercise prescription for resistance, speed, and METs.       Able to understand and use rate of perceived exertion (RPE) scale  Yes       Intervention  Provide  education and explanation on how to use RPE scale       Expected Outcomes  Short Term: Able to use RPE daily in rehab to express subjective intensity level;Long Term:  Able to use RPE to guide intensity level when exercising independently       Knowledge and understanding of Target Heart Rate Range (THRR)  Yes       Intervention  Provide education and explanation of THRR including how the numbers were predicted and where they are located for reference       Expected Outcomes  Short Term: Able to state/look up THRR;Long Term: Able to use THRR to govern intensity when exercising independently;Short Term: Able to use daily as guideline for intensity in rehab       Able to check pulse independently  Yes       Intervention  Provide education and demonstration on how to check pulse in carotid and radial arteries.;Review the importance of being able to check your own pulse for safety during independent exercise       Expected Outcomes  Short Term: Able to explain why pulse checking is important during independent exercise;Long Term: Able to check pulse independently and accurately       Understanding of Exercise Prescription  Yes  Intervention  Provide education, explanation, and written materials on patient's individual exercise prescription       Expected Outcomes  Short Term: Able to explain program exercise prescription;Long Term: Able to explain home exercise prescription to exercise independently          Exercise Goals Re-Evaluation :    Discharge Exercise Prescription (Final Exercise Prescription Changes):   Nutrition:  Target Goals: Understanding of nutrition guidelines, daily intake of sodium 1500mg , cholesterol 200mg , calories 30% from fat and 7% or less from saturated fats, daily to have 5 or more servings of fruits and vegetables.  Biometrics: Pre Biometrics - 04/23/17 1025      Pre Biometrics   Height  5' 5.5" (1.664 m)    Weight  235 lb 7.2 oz (106.8 kg)    Waist  Circumference  42 inches    Hip Circumference  51.75 inches    Waist to Hip Ratio  0.81 %    BMI (Calculated)  38.57    Triceps Skinfold  42 mm    % Body Fat  48.6 %    Grip Strength  28 kg    Flexibility  14.5 in    Single Leg Stand  30 seconds        Nutrition Therapy Plan and Nutrition Goals: Nutrition Therapy & Goals - 04/23/17 1143      Nutrition Therapy   Diet  Heart Healthy      Personal Nutrition Goals   Nutrition Goal  Pt to identify and limit food sources of saturated fat, trans fat, and sodium    Personal Goal #2  Pt wants to increase meals eaten at home and learn to prepare foods in smaller quantities.    Personal Goal #3  Pt to identify food quantities necessary to achieve weight loss of 6-24 lb (2.7-10.9 kg) at graduation from cardiac rehab. Long term wt loss goal of 40-50 lb desired.       Intervention Plan   Intervention  Prescribe, educate and counsel regarding individualized specific dietary modifications aiming towards targeted core components such as weight, hypertension, lipid management, diabetes, heart failure and other comorbidities.;Nutrition handout(s) given to patient. Consistent Vitamin K intake    Expected Outcomes  Short Term Goal: Understand basic principles of dietary content, such as calories, fat, sodium, cholesterol and nutrients.;Long Term Goal: Adherence to prescribed nutrition plan.       Nutrition Assessments: Nutrition Assessments - 04/23/17 1143      MEDFICTS Scores   Pre Score  40       Nutrition Goals Re-Evaluation:   Nutrition Goals Re-Evaluation:   Nutrition Goals Discharge (Final Nutrition Goals Re-Evaluation):   Psychosocial: Target Goals: Acknowledge presence or absence of significant depression and/or stress, maximize coping skills, provide positive support system. Participant is able to verbalize types and ability to use techniques and skills needed for reducing stress and depression.  Initial Review & Psychosocial  Screening: Initial Psych Review & Screening - 04/23/17 1233      Initial Review   Current issues with  Current Stress Concerns    Source of Stress Concerns  Unable to participate in former interests or hobbies;Unable to perform yard/household activities;Occupation;Family    Comments  although she has strong faith feels unsure on how she will do returning to work.      Family Dynamics   Good Support System?  Yes      Barriers   Psychosocial barriers to participate in program  The patient should benefit from training  in stress management and relaxation.      Screening Interventions   Interventions  Encouraged to exercise    Expected Outcomes  Long Term Goal: Stressors or current issues are controlled or eliminated.;Short Term goal: Identification and review with participant of any Quality of Life or Depression concerns found by scoring the questionnaire.;Long Term goal: The participant improves quality of Life and PHQ9 Scores as seen by post scores and/or verbalization of changes       Quality of Life Scores: Quality of Life - 04/23/17 1056      Quality of Life Scores   Health/Function Pre  26.6 %    Socioeconomic Pre  27.43 %    Psych/Spiritual Pre  28.29 %    Family Pre  26.1 %    GLOBAL Pre  27.04 %      Scores of 19 and below usually indicate a poorer quality of life in these areas.  A difference of  2-3 points is a clinically meaningful difference.  A difference of 2-3 points in the total score of the Quality of Life Index has been associated with significant improvement in overall quality of life, self-image, physical symptoms, and general health in studies assessing change in quality of life.  PHQ-9: Recent Review Flowsheet Data    There is no flowsheet data to display.     Interpretation of Total Score  Total Score Depression Severity:  1-4 = Minimal depression, 5-9 = Mild depression, 10-14 = Moderate depression, 15-19 = Moderately severe depression, 20-27 = Severe  depression   Psychosocial Evaluation and Intervention:   Psychosocial Re-Evaluation:   Psychosocial Discharge (Final Psychosocial Re-Evaluation):   Vocational Rehabilitation: Provide vocational rehab assistance to qualifying candidates.   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 Participant will state understanding/return demonstration of topics presented.  Learning Barriers/Preferences: Learning Barriers/Preferences - 04/23/17 0929      Learning Barriers/Preferences   Learning Barriers  Sight    Learning Preferences  Video;Skilled Demonstration;Pictoral       Education Topics: Count Your Pulse:  -Group instruction provided by verbal instruction, demonstration, patient participation and written materials to support subject.  Instructors address importance of being able to find your pulse and how to count your pulse when at home without a heart monitor.  Patients get hands on experience counting their pulse with staff help and individually.   Heart Attack, Angina, and Risk Factor Modification:  -Group instruction provided by verbal instruction, video, and written materials to support subject.  Instructors address signs and symptoms of angina and heart attacks.    Also discuss risk factors for heart disease and how to make changes to improve heart health risk factors.   Functional Fitness:  -Group instruction provided by verbal instruction, demonstration, patient participation, and written materials to support subject.  Instructors address safety measures for doing things around the house.  Discuss how to get up and down off the floor, how  to pick things up properly, how to safely get out of a chair without assistance, and balance training.   Meditation and Mindfulness:  -Group instruction provided by verbal instruction, patient participation, and written materials to support  subject.  Instructor addresses importance of mindfulness and meditation practice to help reduce stress and improve awareness.  Instructor also leads participants through a meditation exercise.    Stretching for Flexibility and Mobility:  -Group instruction provided by verbal instruction, patient participation, and written materials to support subject.  Instructors lead participants through series of stretches that are designed to increase flexibility thus improving mobility.  These stretches are additional exercise for major muscle groups that are typically performed during regular warm up and cool down.   Hands Only CPR:  -Group verbal, video, and participation provides a basic overview of AHA guidelines for community CPR. Role-play of emergencies allow participants the opportunity to practice calling for help and chest compression technique with discussion of AED use.   Hypertension: -Group verbal and written instruction that provides a basic overview of hypertension including the most recent diagnostic guidelines, risk factor reduction with self-care instructions and medication management.    Nutrition I class: Heart Healthy Eating:  -Group instruction provided by PowerPoint slides, verbal discussion, and written materials to support subject matter. The instructor gives an explanation and review of the Therapeutic Lifestyle Changes diet recommendations, which includes a discussion on lipid goals, dietary fat, sodium, fiber, plant stanol/sterol esters, sugar, and the components of a well-balanced, healthy diet.   Nutrition II class: Lifestyle Skills:  -Group instruction provided by PowerPoint slides, verbal discussion, and written materials to support  subject matter. The instructor gives an explanation and review of label reading, grocery shopping for heart health, heart healthy recipe modifications, and ways to make healthier choices when eating out.   Diabetes Question & Answer:  -Group instruction provided by PowerPoint slides, verbal discussion, and written materials to support subject matter. The instructor gives an explanation and review of diabetes co-morbidities, pre- and post-prandial blood glucose goals, pre-exercise blood glucose goals, signs, symptoms, and treatment of hypoglycemia and hyperglycemia, and foot care basics.   Diabetes Blitz:  -Group instruction provided by PowerPoint slides, verbal discussion, and written materials to support subject matter. The instructor gives an explanation and review of the physiology behind type 1 and type 2 diabetes, diabetes medications and rational behind using different medications, pre- and post-prandial blood glucose recommendations and Hemoglobin A1c goals, diabetes diet, and exercise including blood glucose guidelines for exercising safely.    Portion Distortion:  -Group instruction provided by PowerPoint slides, verbal discussion, written materials, and food models to support subject matter. The instructor gives an explanation of serving size versus portion size, changes in portions sizes over the last 20 years, and what consists of a serving from each food group.   Stress Management:  -Group instruction provided by verbal instruction, video, and written materials to support subject matter.  Instructors review role of stress in heart disease and how to cope with stress positively.     Exercising on Your Own:  -Group instruction provided by verbal instruction, power point, and written materials to support subject.  Instructors discuss benefits of exercise, components of exercise, frequency and intensity of exercise, and end points for exercise.  Also discuss use of nitroglycerin and  activating EMS.  Review options of places to exercise outside of rehab.  Review guidelines for sex with heart disease.   Cardiac Drugs I:  -Group instruction provided by verbal instruction and written materials to support subject.  Instructor reviews cardiac drug classes: antiplatelets, anticoagulants, beta blockers, and statins.  Instructor discusses reasons, side effects, and lifestyle considerations for each drug class.   Cardiac Drugs II:  -  Group instruction provided by verbal instruction and written materials to support subject.  Instructor reviews cardiac drug classes: angiotensin converting enzyme inhibitors (ACE-I), angiotensin II receptor blockers (ARBs), nitrates, and calcium channel blockers.  Instructor discusses reasons, side effects, and lifestyle considerations for each drug class.   Anatomy and Physiology of the Circulatory System:  Group verbal and written instruction and models provide basic cardiac anatomy and physiology, with the coronary electrical and arterial systems. Review of: AMI, Angina, Valve disease, Heart Failure, Peripheral Artery Disease, Cardiac Arrhythmia, Pacemakers, and the ICD.   Other Education:  -Group or individual verbal, written, or video instructions that support the educational goals of the cardiac rehab program.   Holiday Eating Survival Tips:  -Group instruction provided by PowerPoint slides, verbal discussion, and written materials to support subject matter. The instructor gives patients tips, tricks, and techniques to help them not only survive but enjoy the holidays despite the onslaught of food that accompanies the holidays.   Knowledge Questionnaire Score: Knowledge Questionnaire Score - 04/23/17 1053      Knowledge Questionnaire Score   Pre Score  20/24       Core Components/Risk Factors/Patient Goals at Admission: Personal Goals and Risk Factors at Admission - 04/23/17 1019      Core Components/Risk Factors/Patient Goals on  Admission    Weight Management  Yes;Obesity    Intervention  Weight Management: Develop a combined nutrition and exercise program designed to reach desired caloric intake, while maintaining appropriate intake of nutrient and fiber, sodium and fats, and appropriate energy expenditure required for the weight goal.;Weight Management: Provide education and appropriate resources to help participant work on and attain dietary goals.;Weight Management/Obesity: Establish reasonable short term and long term weight goals.;Obesity: Provide education and appropriate resources to help participant work on and attain dietary goals.    Admit Weight  235 lb 7.2 oz (106.8 kg)    Goal Weight: Short Term  224 lb (101.6 kg)    Goal Weight: Long Term  214 lb (97.1 kg)    Expected Outcomes  Short Term: Continue to assess and modify interventions until short term weight is achieved;Long Term: Adherence to nutrition and physical activity/exercise program aimed toward attainment of established weight goal;Weight Loss: Understanding of general recommendations for a balanced deficit meal plan, which promotes 1-2 lb weight loss per week and includes a negative energy balance of [phone] kcal/d;Understanding recommendations for meals to include 15-35% energy as protein, 25-35% energy from fat, 35-60% energy from carbohydrates, less than 200mg  of dietary cholesterol, 20-35 gm of total fiber daily;Understanding of distribution of calorie intake throughout the day with the consumption of 4-5 meals/snacks    Hypertension  Yes    Intervention  Provide education on lifestyle modifcations including regular physical activity/exercise, weight management, moderate sodium restriction and increased consumption of fresh fruit, vegetables, and low fat dairy, alcohol moderation, and smoking cessation.;Monitor prescription use compliance.    Expected Outcomes  Short Term: Continued assessment and intervention until BP is < 140/47mm HG in hypertensive  participants. < 130/68mm HG in hypertensive participants with diabetes, heart failure or chronic kidney disease.;Long Term: Maintenance of blood pressure at goal levels.    Lipids  Yes    Intervention  Provide education and support for participant on nutrition & aerobic/resistive exercise along with prescribed medications to achieve LDL 70mg , HDL >40mg .    Expected Outcomes  Short Term: Participant states understanding of desired cholesterol values and is compliant with medications prescribed. Participant is following exercise prescription and nutrition  guidelines.;Long Term: Cholesterol controlled with medications as prescribed, with individualized exercise RX and with personalized nutrition plan. Value goals: LDL < 70mg , HDL > 40 mg.       Core Components/Risk Factors/Patient Goals Review:    Core Components/Risk Factors/Patient Goals at Discharge (Final Review):    ITP Comments: ITP Comments    Row Name 04/23/17 0924           ITP Comments  Dr. Fransico Him, Medical Director          Comments:  Patient attended orientation from [place] to 1100 to review rules and guidelines for program. Completed 6 minute walk test, Intitial ITP, and exercise prescription.  VSS. Telemetry- ST with T wave inversion.  This is present on the most recent 12 lead ekg.  Pt asymptomatic with no complaints.  Brief Psychosocial Assessment reveals no immediate barriers to participating in cardiac rehab.  Pt switched to the 6:45 class due to returning to work.  Pt is accompanied by her friend who is supportive and encouraging with her rehab. [name], BSN Cardiac and Training and development officer

## 2017-04-29 ENCOUNTER — Encounter (HOSPITAL_COMMUNITY): Payer: BLUE CROSS/BLUE SHIELD

## 2017-04-29 ENCOUNTER — Encounter (HOSPITAL_COMMUNITY): Payer: Self-pay

## 2017-04-30 ENCOUNTER — Ambulatory Visit (INDEPENDENT_AMBULATORY_CARE_PROVIDER_SITE_OTHER): Payer: BLUE CROSS/BLUE SHIELD | Admitting: Pharmacist

## 2017-04-30 DIAGNOSIS — I4891 Unspecified atrial fibrillation: Secondary | ICD-10-CM | POA: Diagnosis not present

## 2017-04-30 DIAGNOSIS — Z9889 Other specified postprocedural states: Secondary | ICD-10-CM | POA: Diagnosis not present

## 2017-04-30 DIAGNOSIS — Z5181 Encounter for therapeutic drug level monitoring: Secondary | ICD-10-CM

## 2017-04-30 LAB — POCT INR: INR: 1.6

## 2017-04-30 NOTE — Patient Instructions (Signed)
Description   Start taking 3 tablets daily except 2 tablets on Tuesdays and Saturdays. Recheck INR  In 1 week.

## 2017-05-01 ENCOUNTER — Encounter (HOSPITAL_COMMUNITY): Payer: BLUE CROSS/BLUE SHIELD

## 2017-05-04 ENCOUNTER — Encounter (HOSPITAL_COMMUNITY): Payer: BLUE CROSS/BLUE SHIELD

## 2017-05-04 ENCOUNTER — Encounter (HOSPITAL_COMMUNITY)
Admission: RE | Admit: 2017-05-04 | Discharge: 2017-05-04 | Disposition: A | Discharge status: Home / Self Care | Payer: BLUE CROSS/BLUE SHIELD | Source: Ambulatory Visit | Attending: Cardiovascular Disease | Admitting: Cardiovascular Disease

## 2017-05-04 DIAGNOSIS — I2510 Atherosclerotic heart disease of native coronary artery without angina pectoris: Secondary | ICD-10-CM | POA: Diagnosis not present

## 2017-05-04 DIAGNOSIS — Z951 Presence of aortocoronary bypass graft: Secondary | ICD-10-CM | POA: Diagnosis not present

## 2017-05-04 DIAGNOSIS — Z9889 Other specified postprocedural states: Secondary | ICD-10-CM

## 2017-05-04 NOTE — Progress Notes (Signed)
Daily Session Note  Patient Details  Name: RIKO LUMSDEN MRN: [id number] Date of Birth: 05/02/1955 Referring Provider:     [place] from 04/23/2017 in [place]  Referring Provider  Lauree Chandler D MD      Encounter Date: 05/04/2017  Check In: Session Check In - 05/04/17 1542      Check-In   Location  [facility]    Staff Present  Seward Carol, MS, ACSM CEP, Exercise Physiologist;Tyara Carol Ada, MS,ACSM CEP, Exercise Physiologist;Joann Rion, RN, Marga Melnick, RN, BSN    Supervising physician immediately available to respond to emergencies  [facility] immediately available    Physician(s)  Dr. Clementeen Graham     Medication changes reported      No    Fall or balance concerns reported     No    Tobacco Cessation  No Change    Warm-up and Cool-down  Performed as group-led instruction    Resistance Training Performed  Yes    VAD Patient?  No      Pain Assessment   Currently in Pain?  No/denies       Capillary Blood Glucose: No results found for this or any previous visit (from the past 24 hour(s)).    Social History   Tobacco Use  Smoking Status Former Smoker  . Packs/day: 0.50  . Years: 30.00  . Pack years: 15.00  . Types: Cigarettes  . Last attempt to quit: 12/23/2011  . Years since quitting: 5.3  Smokeless Tobacco Never Used    Goals Met:  Exercise tolerated well  Goals Unmet:  Not Applicable  Comments: Kateri started cardiac rehab today.  Pt tolerated light exercise without difficulty. VSS, telemetry-[facility], asymptomatic.  Medication list reconciled. Pt denies barriers to medicaiton compliance.  PSYCHOSOCIAL ASSESSMENT:  PHQ-0. Pt exhibits positive coping skills, hopeful outlook with supportive family. No psychosocial needs identified at this time, no psychosocial interventions necessary.    Pt enjoys watching TV going out to eat and spending time with friends.   Pt  oriented to exercise equipment and routine.    Understanding verbalized.Barnet Pall, RN,BSN 05/04/2017 4:21 PM   Dr. Fransico Him is Medical Director for Cardiac Rehab at [facility].

## 2017-05-06 ENCOUNTER — Encounter (HOSPITAL_COMMUNITY): Payer: BLUE CROSS/BLUE SHIELD

## 2017-05-08 ENCOUNTER — Encounter (HOSPITAL_COMMUNITY): Payer: BLUE CROSS/BLUE SHIELD

## 2017-05-08 ENCOUNTER — Other Ambulatory Visit: Payer: Self-pay | Admitting: *Deleted

## 2017-05-08 NOTE — Telephone Encounter (Signed)
Pharmacy requested a ninety day supply. 

## 2017-05-11 ENCOUNTER — Encounter (HOSPITAL_COMMUNITY): Payer: BLUE CROSS/BLUE SHIELD

## 2017-05-12 ENCOUNTER — Other Ambulatory Visit: Payer: Self-pay | Admitting: *Deleted

## 2017-05-12 NOTE — Telephone Encounter (Signed)
Pharmacy requests a ninety day supply. 

## 2017-05-13 ENCOUNTER — Other Ambulatory Visit: Payer: Self-pay | Admitting: Cardiovascular Disease

## 2017-05-13 ENCOUNTER — Encounter (HOSPITAL_COMMUNITY): Payer: BLUE CROSS/BLUE SHIELD

## 2017-05-13 ENCOUNTER — Ambulatory Visit (INDEPENDENT_AMBULATORY_CARE_PROVIDER_SITE_OTHER): Payer: BLUE CROSS/BLUE SHIELD | Admitting: *Deleted

## 2017-05-13 DIAGNOSIS — Z9889 Other specified postprocedural states: Secondary | ICD-10-CM

## 2017-05-13 DIAGNOSIS — Z5181 Encounter for therapeutic drug level monitoring: Secondary | ICD-10-CM

## 2017-05-13 DIAGNOSIS — I9789 Other postprocedural complications and disorders of the circulatory system, not elsewhere classified: Secondary | ICD-10-CM

## 2017-05-13 DIAGNOSIS — I4891 Unspecified atrial fibrillation: Secondary | ICD-10-CM | POA: Diagnosis not present

## 2017-05-13 LAB — POCT INR: INR: 1.9

## 2017-05-14 ENCOUNTER — Encounter (HOSPITAL_COMMUNITY): Payer: Self-pay | Admitting: *Deleted

## 2017-05-14 DIAGNOSIS — Z951 Presence of aortocoronary bypass graft: Secondary | ICD-10-CM

## 2017-05-14 DIAGNOSIS — Z9889 Other specified postprocedural states: Secondary | ICD-10-CM

## 2017-05-14 NOTE — Progress Notes (Signed)
Cardiac Individual Treatment Plan  Patient Details  Name: [name] MRN: [id number] Date of Birth: May 31, 1955 Referring Provider:     [place] from 04/23/2017 in [place]  Referring Provider  Lauree Chandler D MD      Initial Encounter Date:    CARDIAC REHAB PHASE II ORIENTATION from 04/23/2017 in [facility] [place]  Date  04/23/17  Referring Provider  Lauree Chandler D MD      Visit Diagnosis: S/P CABG x 4  S/P MVR (mitral valve repair)  Patient's Home Medications on Admission:  Current Outpatient Medications:  .  acetaminophen-codeine (TYLENOL #3) 300-30 MG tablet, Take 1 tablet by mouth every 8 (eight) hours as needed for moderate pain. (Patient not taking: Reported on 04/22/2017), Disp: 30 tablet, Rfl: 0 .  aspirin 81 MG chewable tablet, Chew 81 mg by mouth daily. , Disp: , Rfl:  .  Aspirin-Acetaminophen-Caffeine (GOODY HEADACHE PO), Take 1 packet by mouth daily as needed., Disp: , Rfl:  .  azaTHIOprine (IMURAN) 50 MG tablet, Take 50 mg by mouth 2 (two) times daily., Disp: , Rfl:  .  calcium carbonate (TUMS - DOSED IN MG ELEMENTAL CALCIUM) 500 MG chewable tablet, Chew 1 tablet by mouth 2 (two) times daily with a meal. , Disp: , Rfl:  .  hydroxychloroquine (PLAQUENIL) 200 MG tablet, Take 200 mg by mouth daily. , Disp: , Rfl:  .  levothyroxine (SYNTHROID, LEVOTHROID) 75 MCG tablet, TAKE 1 TABLET(50 MCG) BY MOUTH DAILY, Disp: 90 tablet, Rfl: 0 .  metoprolol tartrate (LOPRESSOR) 25 MG tablet, Take 0.5 tablets (12.5 mg total) by mouth 2 (two) times daily., Disp: 180 tablet, Rfl: 3 .  ondansetron (ZOFRAN ODT) 4 MG disintegrating tablet, Take 1 tablet (4 mg total) by mouth every 8 (eight) hours as needed for nausea. (Patient not taking: Reported on 04/22/2017), Disp: 6 tablet, Rfl: 0 .  pantoprazole (PROTONIX) 40 MG tablet, TAKE 1 TABLET(40 MG) BY MOUTH DAILY, Disp: 90 tablet, Rfl: 3 .   rosuvastatin (CRESTOR) 20 MG tablet, Take 1 tablet (20 mg total) by mouth daily., Disp: 90 tablet, Rfl: 3 .  warfarin (COUMADIN) 2.5 MG tablet, TAKE AS DIRECTED BY COUMADIN CLINIC, Disp: 90 tablet, Rfl: 1  Past Medical History: Past Medical History:  Diagnosis Date  . Chronic ischemic heart disease, unspecified    w/ stents in mid circumflex and mid RCA in 03, and balloon angioplasty of Diagonal branch 10-03, last cath Sept 08- 2 vessel disease w/ patent stents  . Esophageal reflux   . Hypothyroidism   . Immune thrombocytopenic purpura ([place])   . Lupus erythematosus   . Mitral valve insufficiency and aortic valve insufficiency   . Myocardial infarction ([place])    x 2  . Peripheral vascular disease, unspecified ([place])   . Unspecified disease of pericardium     Tobacco Use: Social History   Tobacco Use  Smoking Status Former Smoker  . Packs/day: 0.50  . Years: 30.00  . Pack years: 15.00  . Types: Cigarettes  . Last attempt to quit: 12/23/2011  . Years since quitting: 5.3  Smokeless Tobacco Never Used    Labs: Recent Review Flowsheet Data    Labs for ITP Cardiac and Pulmonary Rehab Latest Ref Rng & Units 01/30/2017 01/30/2017 01/30/2017 01/30/2017 01/31/2017   Cholestrol 0 - 200 mg/dL - - - - -   LDLCALC 0 - 99 mg/dL - - - - -   LDLDIRECT mg/dL - - - - -  HDL >40 mg/dL - - - - -   Trlycerides <150 mg/dL - - - - -   Hemoglobin A1c 4.8 - 5.6 % - - - - 5.6   PHART 7.350 - 7.450 7.374 - 7.400 7.371 -   PCO2ART 32.0 - 48.0 mmHg 35.0 - 32.9 37.9 -   HCO3 20.0 - 28.0 mmol/L 20.5 - 20.3 22.0 -   TCO2 22 - 32 mmol/L 22 20(L) 21(L) 23 23   ACIDBASEDEF 0.0 - 2.0 mmol/L 4.0(H) - 4.0(H) 3.0(H) -   O2SAT % 93.0 - 89.0 98.0 -      Capillary Blood Glucose: Lab Results  Component Value Date   GLUCAP 106 (H) 02/06/2017   GLUCAP 103 (H) 02/06/2017   GLUCAP 110 (H) 02/05/2017   GLUCAP 88 02/05/2017   GLUCAP 86 02/05/2017     Exercise Target Goals:    Exercise Program  Goal: Individual exercise prescription set using results from initial 6 min walk test and THRR while considering  patient's activity barriers and safety.   Exercise Prescription Goal: Initial exercise prescription builds to 30-45 minutes a day of aerobic activity, 2-3 days per week.  Home exercise guidelines will be given to patient during program as part of exercise prescription that the participant will acknowledge.  Activity Barriers & Risk Stratification: Activity Barriers & Cardiac Risk Stratification - 04/23/17 1102      Activity Barriers & Cardiac Risk Stratification   Comments  Sciatica, Lupus        6 Minute Walk: 6 Minute Walk    Row Name 04/23/17 1026 04/23/17 1053       6 Minute Walk   Phase  Initial  -    Distance  1302 feet  -    Walk Time  6 minutes  -    # of Rest Breaks  0  -    METS  1.74  -    RPE  9  -    Perceived Dyspnea   0  -    VO2 Peak  6.09  -    Symptoms  No  -    Resting HR  58 bpm  -    Resting BP  120/62  -    Resting Oxygen Saturation   99 %  -    Exercise Oxygen Saturation  during 6 min walk  99 %  -    Max Ex. HR  99 bpm  -    Max Ex. BP  102/78  -    2 Minute Post BP  -  102/72       Oxygen Initial Assessment:   Oxygen Re-Evaluation:   Oxygen Discharge (Final Oxygen Re-Evaluation):   Initial Exercise Prescription: Initial Exercise Prescription - 04/23/17 1000      Date of Initial Exercise RX and Referring Provider   Date  04/23/17    Referring Provider  Lauree Chandler D MD      Treadmill   MPH  1    Grade  0    Minutes  10    METs  1.77      NuStep   Level  2    SPM  70    Minutes  10    METs  1.8      Track   Laps  9    Minutes  10    METs  2.53      Prescription Details   Frequency (times per week)  3x    Duration  Progress to 30 minutes of continuous aerobic without signs/symptoms of physical distress      Intensity   THRR 40-80% of Max Heartrate  64-127    Ratings of Perceived Exertion  11-13     Perceived Dyspnea  0-4      Progression   Progression  Continue progressive overload as per policy without signs/symptoms or physical distress.      Resistance Training   Training Prescription  Yes    Weight  2lbs    Reps  10-15       Perform Capillary Blood Glucose checks as needed.  Exercise Prescription Changes:   Exercise Comments:   Exercise Goals and Review: Exercise Goals    Row Name 04/23/17 0930             Exercise Goals   Increase Physical Activity  Yes       Intervention  Provide advice, education, support and counseling about physical activity/exercise needs.;Develop an individualized exercise prescription for aerobic and resistive training based on initial evaluation findings, risk stratification, comorbidities and participant's personal goals.       Expected Outcomes  Long Term: Exercising regularly at least 3-5 days a week.;Short Term: Attend rehab on a regular basis to increase amount of physical activity.;Long Term: Add in home exercise to make exercise part of routine and to increase amount of physical activity.       Increase Strength and Stamina  Yes       Intervention  Provide advice, education, support and counseling about physical activity/exercise needs.;Develop an individualized exercise prescription for aerobic and resistive training based on initial evaluation findings, risk stratification, comorbidities and participant's personal goals.       Expected Outcomes  Short Term: Perform resistance training exercises routinely during rehab and add in resistance training at home;Long Term: Improve cardiorespiratory fitness, muscular endurance and strength as measured by increased METs and functional capacity (6MWT);Short Term: Increase workloads from initial exercise prescription for resistance, speed, and METs.       Able to understand and use rate of perceived exertion (RPE) scale  Yes       Intervention  Provide education and explanation on how to use  RPE scale       Expected Outcomes  Short Term: Able to use RPE daily in rehab to express subjective intensity level;Long Term:  Able to use RPE to guide intensity level when exercising independently       Knowledge and understanding of Target Heart Rate Range (THRR)  Yes       Intervention  Provide education and explanation of THRR including how the numbers were predicted and where they are located for reference       Expected Outcomes  Short Term: Able to state/look up THRR;Long Term: Able to use THRR to govern intensity when exercising independently;Short Term: Able to use daily as guideline for intensity in rehab       Able to check pulse independently  Yes       Intervention  Provide education and demonstration on how to check pulse in carotid and radial arteries.;Review the importance of being able to check your own pulse for safety during independent exercise       Expected Outcomes  Short Term: Able to explain why pulse checking is important during independent exercise;Long Term: Able to check pulse independently and accurately       Understanding of Exercise Prescription  Yes       Intervention  Provide education, explanation,  and written materials on patient's individual exercise prescription       Expected Outcomes  Short Term: Able to explain program exercise prescription;Long Term: Able to explain home exercise prescription to exercise independently          Exercise Goals Re-Evaluation :    Discharge Exercise Prescription (Final Exercise Prescription Changes):   Nutrition:  Target Goals: Understanding of nutrition guidelines, daily intake of sodium 1500mg , cholesterol 200mg , calories 30% from fat and 7% or less from saturated fats, daily to have 5 or more servings of fruits and vegetables.  Biometrics: Pre Biometrics - 04/23/17 1025      Pre Biometrics   Height  5' 5.5" (1.664 m)    Weight  235 lb 7.2 oz (106.8 kg)    Waist Circumference  42 inches    Hip Circumference   51.75 inches    Waist to Hip Ratio  0.81 %    BMI (Calculated)  38.57    Triceps Skinfold  42 mm    % Body Fat  48.6 %    Grip Strength  28 kg    Flexibility  14.5 in    Single Leg Stand  30 seconds        Nutrition Therapy Plan and Nutrition Goals: Nutrition Therapy & Goals - 04/23/17 1143      Nutrition Therapy   Diet  Heart Healthy      Personal Nutrition Goals   Nutrition Goal  Pt to identify and limit food sources of saturated fat, trans fat, and sodium    Personal Goal #2  Pt wants to increase meals eaten at home and learn to prepare foods in smaller quantities.    Personal Goal #3  Pt to identify food quantities necessary to achieve weight loss of 6-24 lb (2.7-10.9 kg) at graduation from cardiac rehab. Long term wt loss goal of 40-50 lb desired.       Intervention Plan   Intervention  Prescribe, educate and counsel regarding individualized specific dietary modifications aiming towards targeted core components such as weight, hypertension, lipid management, diabetes, heart failure and other comorbidities.;Nutrition handout(s) given to patient. Consistent Vitamin K intake    Expected Outcomes  Short Term Goal: Understand basic principles of dietary content, such as calories, fat, sodium, cholesterol and nutrients.;Long Term Goal: Adherence to prescribed nutrition plan.       Nutrition Assessments: Nutrition Assessments - 04/23/17 1143      MEDFICTS Scores   Pre Score  40       Nutrition Goals Re-Evaluation:   Nutrition Goals Re-Evaluation:   Nutrition Goals Discharge (Final Nutrition Goals Re-Evaluation):   Psychosocial: Target Goals: Acknowledge presence or absence of significant depression and/or stress, maximize coping skills, provide positive support system. Participant is able to verbalize types and ability to use techniques and skills needed for reducing stress and depression.  Initial Review & Psychosocial Screening: Initial Psych Review & Screening -  05/14/17 1354      Initial Review   Current issues with  --    Source of Stress Concerns  --    Comments  although she has strong faith feels unsure on how she will do returning to work.       Quality of Life Scores: Quality of Life - 04/23/17 1056      Quality of Life Scores   Health/Function Pre  26.6 %    Socioeconomic Pre  27.43 %    Psych/Spiritual Pre  28.29 %    Family Pre  26.1 %    GLOBAL Pre  27.04 %      Scores of 19 and below usually indicate a poorer quality of life in these areas.  A difference of  2-3 points is a clinically meaningful difference.  A difference of 2-3 points in the total score of the Quality of Life Index has been associated with significant improvement in overall quality of life, self-image, physical symptoms, and general health in studies assessing change in quality of life.  PHQ-9: Recent Review Flowsheet Data    Depression screen [facility] 2/9 05/04/2017   Decreased Interest 0   Down, Depressed, Hopeless 0   PHQ - 2 Score 0     Interpretation of Total Score  Total Score Depression Severity:  1-4 = Minimal depression, 5-9 = Mild depression, 10-14 = Moderate depression, 15-19 = Moderately severe depression, 20-27 = Severe depression   Psychosocial Evaluation and Intervention:   Psychosocial Re-Evaluation: Psychosocial Re-Evaluation    [place] Name 05/14/17 1354 05/14/17 1355           Psychosocial Re-Evaluation   Current issues with  Current Stress Concerns  -      Interventions  -  Relaxation education;Encouraged to attend Cardiac Rehabilitation for the exercise      Comments  -  although she has strong faith feels unsure on how she will do returning to work.        Initial Review   Source of Stress Concerns  -  Unable to participate in former interests or hobbies;Unable to perform yard/household activities;Occupation;Family         Psychosocial Discharge (Final Psychosocial Re-Evaluation): Psychosocial Re-Evaluation - 05/14/17 1355       Psychosocial Re-Evaluation   Interventions  Relaxation education;Encouraged to attend Cardiac Rehabilitation for the exercise    Comments  although she has strong faith feels unsure on how she will do returning to work.      Initial Review   Source of Stress Concerns  Unable to participate in former interests or hobbies;Unable to perform yard/household activities;Occupation;Family       Vocational Rehabilitation: Provide vocational rehab assistance to qualifying candidates.   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 Participant will state understanding/return demonstration of topics presented.  Learning Barriers/Preferences: Learning Barriers/Preferences - 04/23/17 0929      Learning Barriers/Preferences   Learning Barriers  Sight    Learning Preferences  Video;Skilled Demonstration;Pictoral       Education Topics: Count Your Pulse:  -Group instruction provided by verbal instruction, demonstration, patient participation and written materials to support subject.  Instructors address importance of being able to find your pulse and how to count your pulse when at home without a heart monitor.  Patients get hands on experience counting their pulse with staff help and individually.   Heart Attack, Angina, and Risk Factor Modification:  -Group instruction provided by verbal instruction, video, and written materials to support subject.  Instructors address signs and symptoms of angina and heart attacks.    Also discuss risk factors for heart disease and how to make changes to improve heart health risk factors.   Functional Fitness:  -Group  instruction  provided by verbal instruction, demonstration, patient participation, and written materials to support subject.  Instructors address safety measures for doing things around the house.  Discuss how to get up and down off the floor, how to pick things up properly, how to safely get out of a chair without assistance, and balance training.   Meditation and Mindfulness:  -Group instruction provided by verbal instruction, patient participation, and written materials to support subject.  Instructor addresses importance of mindfulness and meditation practice to help reduce stress and improve awareness.  Instructor also leads participants through a meditation exercise.    Stretching for Flexibility and Mobility:  -Group instruction provided by verbal instruction, patient participation, and written materials to support subject.  Instructors lead participants through series of stretches that are designed to increase flexibility thus improving mobility.  These stretches are additional exercise for major muscle groups that are typically performed during regular warm up and cool down.   Hands Only CPR:  -Group verbal, video, and participation provides a basic overview of AHA guidelines for community CPR. Role-play of emergencies allow participants the opportunity to practice calling for help and chest compression technique with discussion of AED use.   Hypertension: -Group verbal and written instruction that provides a basic overview of hypertension including the most recent diagnostic guidelines, risk factor reduction with self-care instructions and medication management.    Nutrition I class: Heart Healthy Eating:  -Group instruction provided by PowerPoint slides, verbal discussion, and written materials to support subject matter. The instructor gives an explanation and review of the Therapeutic Lifestyle Changes diet recommendations, which includes a discussion on lipid goals, dietary fat,  sodium, fiber, plant stanol/sterol esters, sugar, and the components of a well-balanced, healthy diet.   Nutrition II class: Lifestyle Skills:  -Group instruction provided by PowerPoint slides, verbal discussion, and written materials to support subject matter. The instructor gives an explanation and review of label reading, grocery shopping for heart health, heart healthy recipe modifications, and ways to make healthier choices when eating out.   Diabetes Question & Answer:  -Group instruction provided by PowerPoint slides, verbal discussion, and written materials to support subject matter. The instructor gives an explanation and review of diabetes co-morbidities, pre- and post-prandial blood glucose goals, pre-exercise blood glucose goals, signs, symptoms, and treatment of hypoglycemia and hyperglycemia, and foot care basics.   Diabetes Blitz:  -Group instruction provided by PowerPoint slides, verbal discussion, and written materials to support subject matter. The instructor gives an explanation and review of the physiology behind type 1 and type 2 diabetes, diabetes medications and rational behind using different medications, pre- and post-prandial blood glucose recommendations and Hemoglobin A1c goals, diabetes diet, and exercise including blood glucose guidelines for exercising safely.    Portion Distortion:  -Group instruction provided by PowerPoint slides, verbal discussion, written materials, and food models to support subject matter. The instructor gives an explanation of serving size versus portion size, changes in portions sizes over the last 20 years, and what consists of a serving from each food group.   Stress Management:  -Group instruction provided by verbal instruction, video, and written materials to support subject matter.  Instructors review role of stress in heart disease and how to cope with stress positively.     Exercising on Your Own:  -Group instruction provided by  verbal instruction, power point, and written materials to support subject.  Instructors discuss benefits of exercise, components of exercise, frequency and intensity of exercise, and end points for exercise.  Also discuss  use of nitroglycerin and activating EMS.  Review options of places to exercise outside of rehab.  Review guidelines for sex with heart disease.   Cardiac Drugs I:  -Group instruction provided by verbal instruction and written materials to support subject.  Instructor reviews cardiac drug classes: antiplatelets, anticoagulants, beta blockers, and statins.  Instructor discusses reasons, side effects, and lifestyle considerations for each drug class.   Cardiac Drugs II:  -Group instruction provided by verbal instruction and written materials to support subject.  Instructor reviews cardiac drug classes: angiotensin converting enzyme inhibitors (ACE-I), angiotensin II receptor blockers (ARBs), nitrates, and calcium channel blockers.  Instructor discusses reasons, side effects, and lifestyle considerations for each drug class.   Anatomy and Physiology of the Circulatory System:  Group verbal and written instruction and models provide basic cardiac anatomy and physiology, with the coronary electrical and arterial systems. Review of: AMI, Angina, Valve disease, Heart Failure, Peripheral Artery Disease, Cardiac Arrhythmia, Pacemakers, and the ICD.   Other Education:  -Group or individual verbal, written, or video instructions that support the educational goals of the cardiac rehab program.   Holiday Eating Survival Tips:  -Group instruction provided by PowerPoint slides, verbal discussion, and written materials to support subject matter. The instructor gives patients tips, tricks, and techniques to help them not only survive but enjoy the holidays despite the onslaught of food that accompanies the holidays.   Knowledge Questionnaire Score: Knowledge Questionnaire Score - 04/23/17  1053      Knowledge Questionnaire Score   Pre Score  20/24       Core Components/Risk Factors/Patient Goals at Admission: Personal Goals and Risk Factors at Admission - 04/23/17 1019      Core Components/Risk Factors/Patient Goals on Admission    Weight Management  Yes;Obesity    Intervention  Weight Management: Develop a combined nutrition and exercise program designed to reach desired caloric intake, while maintaining appropriate intake of nutrient and fiber, sodium and fats, and appropriate energy expenditure required for the weight goal.;Weight Management: Provide education and appropriate resources to help participant work on and attain dietary goals.;Weight Management/Obesity: Establish reasonable short term and long term weight goals.;Obesity: Provide education and appropriate resources to help participant work on and attain dietary goals.    Admit Weight  235 lb 7.2 oz (106.8 kg)    Goal Weight: Short Term  224 lb (101.6 kg)    Goal Weight: Long Term  214 lb (97.1 kg)    Expected Outcomes  Short Term: Continue to assess and modify interventions until short term weight is achieved;Long Term: Adherence to nutrition and physical activity/exercise program aimed toward attainment of established weight goal;Weight Loss: Understanding of general recommendations for a balanced deficit meal plan, which promotes 1-2 lb weight loss per week and includes a negative energy balance of [phone] kcal/d;Understanding recommendations for meals to include 15-35% energy as protein, 25-35% energy from fat, 35-60% energy from carbohydrates, less than 200mg  of dietary cholesterol, 20-35 gm of total fiber daily;Understanding of distribution of calorie intake throughout the day with the consumption of 4-5 meals/snacks    Hypertension  Yes    Intervention  Provide education on lifestyle modifcations including regular physical activity/exercise, weight management, moderate sodium restriction and increased consumption  of fresh fruit, vegetables, and low fat dairy, alcohol moderation, and smoking cessation.;Monitor prescription use compliance.    Expected Outcomes  Short Term: Continued assessment and intervention until BP is < 140/77mm HG in hypertensive participants. < 130/11mm HG in hypertensive participants with diabetes, heart  failure or chronic kidney disease.;Long Term: Maintenance of blood pressure at goal levels.    Lipids  Yes    Intervention  Provide education and support for participant on nutrition & aerobic/resistive exercise along with prescribed medications to achieve LDL 70mg , HDL >40mg .    Expected Outcomes  Short Term: Participant states understanding of desired cholesterol values and is compliant with medications prescribed. Participant is following exercise prescription and nutrition guidelines.;Long Term: Cholesterol controlled with medications as prescribed, with individualized exercise RX and with personalized nutrition plan. Value goals: LDL < 70mg , HDL > 40 mg.       Core Components/Risk Factors/Patient Goals Review:  Goals and Risk Factor Review    Row Name 05/14/17 1351             Core Components/Risk Factors/Patient Goals Review   Personal Goals Review  Weight Management/Obesity;Hypertension;Lipids       Review  Genisis has only attended 2 exercise sessions, including orientation       Expected Outcomes  Jilliam will attnd exercise at cardiac rehab on a regular basis to get the benefits          Core Components/Risk Factors/Patient Goals at Discharge (Final Review):  Goals and Risk Factor Review - 05/14/17 1351      Core Components/Risk Factors/Patient Goals Review   Personal Goals Review  Weight Management/Obesity;Hypertension;Lipids    Review  Lillyn has only attended 2 exercise sessions, including orientation    Expected Outcomes  Nataya will attnd exercise at cardiac rehab on a regular basis to get the benefits       ITP Comments: ITP Comments    Row Name 04/23/17  0924 05/14/17 1355         ITP Comments  Dr. Fransico Him, Medical Director  30 day ITP Review. Chinita has only attended 2 exercise sessions including orientation.         Comments: See ITP comments.Barnet Pall, RN,BSN 05/14/2017 1:58 PM

## 2017-05-15 ENCOUNTER — Encounter (HOSPITAL_COMMUNITY): Payer: BLUE CROSS/BLUE SHIELD

## 2017-05-18 ENCOUNTER — Encounter (HOSPITAL_COMMUNITY): Payer: BLUE CROSS/BLUE SHIELD

## 2017-05-20 ENCOUNTER — Telehealth (HOSPITAL_COMMUNITY): Payer: Self-pay | Admitting: *Deleted

## 2017-05-20 ENCOUNTER — Encounter (HOSPITAL_COMMUNITY): Payer: BLUE CROSS/BLUE SHIELD

## 2017-05-20 NOTE — Telephone Encounter (Signed)
Returned patients call.  Pt struggles to get to cardiac rehab due to her changing work schedule.  Spoke with pt a couple of weeks ago for options in class time and whether exercising at a community gym would give her the flexibility she needs due to her work schedule.  [name] informed rehab staff that she has joined a gym and has consistently attending in the evening hours.  Pt desires to discontinue her participation in Cardiac Rehab.  [name], BSN Cardiac and Training and development officer

## 2017-05-22 ENCOUNTER — Encounter (HOSPITAL_COMMUNITY): Payer: BLUE CROSS/BLUE SHIELD

## 2017-05-25 ENCOUNTER — Encounter (HOSPITAL_COMMUNITY): Payer: BLUE CROSS/BLUE SHIELD

## 2017-05-25 ENCOUNTER — Ambulatory Visit: Payer: BLUE CROSS/BLUE SHIELD | Admitting: Internal Medicine

## 2017-05-27 ENCOUNTER — Ambulatory Visit (INDEPENDENT_AMBULATORY_CARE_PROVIDER_SITE_OTHER): Payer: BLUE CROSS/BLUE SHIELD | Admitting: Internal Medicine

## 2017-05-27 ENCOUNTER — Encounter: Payer: Self-pay | Admitting: Internal Medicine

## 2017-05-27 ENCOUNTER — Encounter (HOSPITAL_COMMUNITY): Payer: BLUE CROSS/BLUE SHIELD

## 2017-05-27 ENCOUNTER — Other Ambulatory Visit (INDEPENDENT_AMBULATORY_CARE_PROVIDER_SITE_OTHER): Payer: BLUE CROSS/BLUE SHIELD

## 2017-05-27 VITALS — BP 138/80 | HR 78 | Temp 98.5°F | Resp 16 | Ht 65.5 in | Wt 236.0 lb

## 2017-05-27 DIAGNOSIS — I25709 Atherosclerosis of coronary artery bypass graft(s), unspecified, with unspecified angina pectoris: Secondary | ICD-10-CM

## 2017-05-27 DIAGNOSIS — Z124 Encounter for screening for malignant neoplasm of cervix: Secondary | ICD-10-CM

## 2017-05-27 DIAGNOSIS — Z1231 Encounter for screening mammogram for malignant neoplasm of breast: Secondary | ICD-10-CM

## 2017-05-27 DIAGNOSIS — D539 Nutritional anemia, unspecified: Secondary | ICD-10-CM

## 2017-05-27 DIAGNOSIS — Z0000 Encounter for general adult medical examination without abnormal findings: Secondary | ICD-10-CM

## 2017-05-27 DIAGNOSIS — E039 Hypothyroidism, unspecified: Secondary | ICD-10-CM

## 2017-05-27 LAB — CBC WITH DIFFERENTIAL/PLATELET
Basophils Absolute: 0 10*3/uL (ref 0.0–0.1)
Basophils Relative: 1.5 % (ref 0.0–3.0)
Eosinophils Absolute: 0.2 10*3/uL (ref 0.0–0.7)
Eosinophils Relative: 5.1 % — ABNORMAL HIGH (ref 0.0–5.0)
HCT: 37.3 % (ref 36.0–46.0)
Hemoglobin: 12.5 g/dL (ref 12.0–15.0)
Lymphocytes Relative: 21.8 % (ref 12.0–46.0)
Lymphs Abs: 0.7 10*3/uL (ref 0.7–4.0)
MCHC: 33.5 g/dL (ref 30.0–36.0)
MCV: 96.4 fl (ref 78.0–100.0)
Monocytes Absolute: 0.7 10*3/uL (ref 0.1–1.0)
Monocytes Relative: 21.1 % — ABNORMAL HIGH (ref 3.0–12.0)
Neutro Abs: 1.6 10*3/uL (ref 1.4–7.7)
Neutrophils Relative %: 50.5 % (ref 43.0–77.0)
Platelets: 236 10*3/uL (ref 150.0–400.0)
RBC: 3.87 Mil/uL (ref 3.87–5.11)
RDW: 17.6 % — ABNORMAL HIGH (ref 11.5–15.5)
WBC: 3.1 10*3/uL — ABNORMAL LOW (ref 4.0–10.5)

## 2017-05-27 LAB — IBC PANEL
Iron: 57 ug/dL (ref 42–145)
Saturation Ratios: 13.3 % — ABNORMAL LOW (ref 20.0–50.0)
Transferrin: 305 mg/dL (ref 212.0–360.0)

## 2017-05-27 LAB — VITAMIN B12: Vitamin B-12: 454 pg/mL (ref 211–911)

## 2017-05-27 LAB — FOLATE: Folate: 10.2 ng/mL (ref >5.9)

## 2017-05-27 LAB — FERRITIN: Ferritin: 27.7 ng/mL (ref 10.0–291.0)

## 2017-05-27 LAB — TSH: TSH: 0.94 u[IU]/mL (ref 0.35–4.50)

## 2017-05-27 NOTE — Patient Instructions (Signed)

## 2017-05-27 NOTE — Progress Notes (Signed)
Subjective:  Patient ID: [name], female    DOB: 10/06/1955  Age: 62 y.o. MRN: [id number]  CC: Anemia and Hypothyroidism   HPI [name] presents for f/up - She feels well and offers no complaints.  She had open heart surgery last fall and had postop anemia.  She is not aware of any sources of blood loss.  She denies any fatigue or paresthesias.  Outpatient Medications Prior to Visit  Medication Sig Dispense Refill  . aspirin 81 MG chewable tablet Chew 81 mg by mouth daily.     . Aspirin-Acetaminophen-Caffeine (GOODY HEADACHE PO) Take 1 packet by mouth daily as needed.    Marland Kitchen azaTHIOprine (IMURAN) 50 MG tablet Take 50 mg by mouth 2 (two) times daily.    . calcium carbonate (TUMS - DOSED IN MG ELEMENTAL CALCIUM) 500 MG chewable tablet Chew 1 tablet by mouth 2 (two) times daily with a meal.     . hydroxychloroquine (PLAQUENIL) 200 MG tablet Take 200 mg by mouth daily.     Marland Kitchen levothyroxine (SYNTHROID, LEVOTHROID) 75 MCG tablet TAKE 1 TABLET(50 MCG) BY MOUTH DAILY 90 tablet 0  . pantoprazole (PROTONIX) 40 MG tablet TAKE 1 TABLET(40 MG) BY MOUTH DAILY 90 tablet 3  . warfarin (COUMADIN) 2.5 MG tablet TAKE AS DIRECTED BY COUMADIN CLINIC 90 tablet 1  . acetaminophen-codeine (TYLENOL #3) 300-30 MG tablet Take 1 tablet by mouth every 8 (eight) hours as needed for moderate pain. 30 tablet 0  . ondansetron (ZOFRAN ODT) 4 MG disintegrating tablet Take 1 tablet (4 mg total) by mouth every 8 (eight) hours as needed for nausea. 6 tablet 0  . metoprolol tartrate (LOPRESSOR) 25 MG tablet Take 0.5 tablets (12.5 mg total) by mouth 2 (two) times daily. 180 tablet 3  . rosuvastatin (CRESTOR) 20 MG tablet Take 1 tablet (20 mg total) by mouth daily. 90 tablet 3   No facility-administered medications prior to visit.     ROS Review of Systems  Constitutional: Negative for chills, diaphoresis, fatigue and fever.  HENT: Negative.  Negative for trouble swallowing.   Eyes: Negative.   Respiratory:  Negative.  Negative for cough, chest tightness, shortness of breath and wheezing.   Cardiovascular: Negative for chest pain, palpitations and leg swelling.  Gastrointestinal: Negative for abdominal pain, constipation, diarrhea, nausea and vomiting.  Endocrine: Negative.  Negative for cold intolerance and heat intolerance.  Genitourinary: Negative.  Negative for decreased urine volume, difficulty urinating and urgency.  Musculoskeletal: Negative for arthralgias, back pain, myalgias and neck pain.  Skin: Negative.  Negative for color change, pallor and rash.  Allergic/Immunologic: Negative.   Neurological: Negative.  Negative for dizziness, weakness, light-headedness and headaches.  Hematological: Negative for adenopathy. Does not bruise/bleed easily.  Psychiatric/Behavioral: Negative.     Objective:  BP 138/80 (BP Location: Left Arm, Patient Position: Sitting, Cuff Size: Large)   Pulse 78   Temp 98.5 F (36.9 C) (Oral)   Resp 16   Ht 5' 5.5" (1.664 m)   Wt 236 lb (107 kg)   SpO2 98%   BMI 38.68 kg/m   BP Readings from Last 3 Encounters:  05/27/17 138/80  04/09/17 100/68  03/23/17 100/68    Wt Readings from Last 3 Encounters:  05/27/17 236 lb (107 kg)  04/23/17 235 lb 7.2 oz (106.8 kg)  04/09/17 233 lb 12.8 oz (106.1 kg)    Physical Exam  Constitutional: She is oriented to person, place, and time. No distress.  HENT:  Mouth/Throat: Oropharynx  is clear and moist. No oropharyngeal exudate.  Eyes: Conjunctivae are normal. Left eye exhibits no discharge. No scleral icterus.  Neck: Normal range of motion. Neck supple. No JVD present. No thyromegaly present.  Cardiovascular: Normal rate, regular rhythm, S1 normal and S2 normal. Exam reveals no gallop.  Murmur heard.  Systolic murmur is present with a grade of 1/6.  No diastolic murmur is present. 1/6 SEM  Pulmonary/Chest: Effort normal and breath sounds normal. No respiratory distress. She has no wheezes. She has no rales.    Abdominal: Soft. Bowel sounds are normal. She exhibits no distension and no mass. There is no tenderness. There is no guarding.  Musculoskeletal: Normal range of motion. She exhibits no edema, tenderness or deformity.  Lymphadenopathy:    She has no cervical adenopathy.  Neurological: She is alert and oriented to person, place, and time. No cranial nerve deficit. Coordination normal.  Skin: Skin is warm and dry. No rash noted. She is not diaphoretic. No erythema. No pallor.  Vitals reviewed.   Lab Results  Component Value Date   WBC 3.1 (L) 05/27/2017   HGB 12.5 05/27/2017   HCT 37.3 05/27/2017   PLT 236.0 05/27/2017   GLUCOSE 107 (H) 02/28/2017   CHOL 110 01/26/2017   TRIG 58 01/26/2017   HDL 46 01/26/2017   LDLDIRECT 321.8 02/27/2012   LDLCALC 52 01/26/2017   ALT 9 11/03/2016   AST 15 11/03/2016   NA 137 02/28/2017   K 4.0 02/28/2017   CL 107 02/28/2017   CREATININE 0.95 02/28/2017   BUN 12 02/28/2017   CO2 22 02/28/2017   TSH 0.94 05/27/2017   INR 1.9 05/13/2017   HGBA1C 5.6 01/31/2017    No results found.  Assessment & Plan:   [name] was seen today for anemia and hypothyroidism.  Diagnoses and all orders for this visit:  Acquired hypothyroidism- Her TSH is in the normal range.  She will remain on the current dose of levothyroxine. -     TSH; Future  Deficiency anemia- Her H&H are normal now.  She has mild iron deficiency anemia and will start an over-the-counter iron supplement.  The other vitamin levels are normal. -     CBC with Differential/Platelet; Future -     IBC panel; Future -     Vitamin B12; Future -     Folate; Future -     Ferritin; Future  Routine general medical examination at a health care facility- Exam completed, labs reviewed, vaccines reviewed and updated, she is referred for a screening mammogram and Pap smear, colon cancer screening is up-to-date. -     Hepatitis C antibody; Future -     HIV antibody; Future  Cervical cancer  screening -     Ambulatory referral to Gynecology  Visit for screening mammogram -     MM DIGITAL SCREENING BILATERAL; Future   I have discontinued [name]'s acetaminophen-codeine and ondansetron. I am also having her maintain her aspirin, azaTHIOprine, pantoprazole, rosuvastatin, calcium carbonate, metoprolol tartrate, levothyroxine, hydroxychloroquine, Aspirin-Acetaminophen-Caffeine (GOODY HEADACHE PO), and warfarin.  No orders of the defined types were placed in this encounter.    Follow-up: Return in about 6 months (around 11/27/2017).  Scarlette Calico, MD

## 2017-05-28 ENCOUNTER — Encounter: Payer: Self-pay | Admitting: Internal Medicine

## 2017-05-28 LAB — HIV ANTIBODY (ROUTINE TESTING W REFLEX): HIV 1&2 Ab, 4th Generation: NONREACTIVE

## 2017-05-28 LAB — HEPATITIS C ANTIBODY
Hepatitis C Ab: NONREACTIVE
SIGNAL TO CUT-OFF: 0.03 (ref <1.00)

## 2017-05-29 ENCOUNTER — Encounter (HOSPITAL_COMMUNITY): Payer: BLUE CROSS/BLUE SHIELD

## 2017-05-29 ENCOUNTER — Ambulatory Visit (INDEPENDENT_AMBULATORY_CARE_PROVIDER_SITE_OTHER): Payer: BLUE CROSS/BLUE SHIELD | Admitting: Pharmacist

## 2017-05-29 DIAGNOSIS — Z9889 Other specified postprocedural states: Secondary | ICD-10-CM

## 2017-05-29 DIAGNOSIS — Z0000 Encounter for general adult medical examination without abnormal findings: Secondary | ICD-10-CM | POA: Diagnosis not present

## 2017-05-29 DIAGNOSIS — Z5181 Encounter for therapeutic drug level monitoring: Secondary | ICD-10-CM

## 2017-05-29 DIAGNOSIS — I4891 Unspecified atrial fibrillation: Secondary | ICD-10-CM | POA: Diagnosis not present

## 2017-05-29 DIAGNOSIS — I9789 Other postprocedural complications and disorders of the circulatory system, not elsewhere classified: Secondary | ICD-10-CM

## 2017-05-29 LAB — POCT INR: INR: 1.5

## 2017-05-29 NOTE — Patient Instructions (Signed)
Description   Today and tomorrow take 4 tablets, then start taking 3 tablets daily. Recheck INR in 1 week.

## 2017-06-01 ENCOUNTER — Encounter (HOSPITAL_COMMUNITY): Payer: BLUE CROSS/BLUE SHIELD

## 2017-06-03 ENCOUNTER — Encounter (HOSPITAL_COMMUNITY): Payer: BLUE CROSS/BLUE SHIELD

## 2017-06-05 ENCOUNTER — Encounter (HOSPITAL_COMMUNITY): Payer: BLUE CROSS/BLUE SHIELD

## 2017-06-08 ENCOUNTER — Ambulatory Visit (INDEPENDENT_AMBULATORY_CARE_PROVIDER_SITE_OTHER): Payer: BLUE CROSS/BLUE SHIELD

## 2017-06-08 ENCOUNTER — Ambulatory Visit (INDEPENDENT_AMBULATORY_CARE_PROVIDER_SITE_OTHER): Payer: BLUE CROSS/BLUE SHIELD | Admitting: *Deleted

## 2017-06-08 ENCOUNTER — Encounter (HOSPITAL_COMMUNITY): Payer: BLUE CROSS/BLUE SHIELD

## 2017-06-08 DIAGNOSIS — I9789 Other postprocedural complications and disorders of the circulatory system, not elsewhere classified: Secondary | ICD-10-CM

## 2017-06-08 DIAGNOSIS — I4891 Unspecified atrial fibrillation: Secondary | ICD-10-CM | POA: Diagnosis not present

## 2017-06-08 DIAGNOSIS — Z5181 Encounter for therapeutic drug level monitoring: Secondary | ICD-10-CM

## 2017-06-08 DIAGNOSIS — Z9889 Other specified postprocedural states: Secondary | ICD-10-CM | POA: Diagnosis not present

## 2017-06-08 DIAGNOSIS — Z0000 Encounter for general adult medical examination without abnormal findings: Secondary | ICD-10-CM

## 2017-06-08 LAB — POCT INR: INR: 1.5

## 2017-06-08 NOTE — Patient Instructions (Signed)
Description   Today March 18th take 4 tablets (10mg ) then change dose of coumadin to 3 tablets (7.5mg ) daily except 4 tablets (10mg )  on Tuesdays Thursdays and Saturdays . Recheck INR in 1 week.

## 2017-06-10 ENCOUNTER — Encounter (HOSPITAL_COMMUNITY): Payer: BLUE CROSS/BLUE SHIELD

## 2017-06-12 ENCOUNTER — Encounter (HOSPITAL_COMMUNITY): Payer: BLUE CROSS/BLUE SHIELD

## 2017-06-15 ENCOUNTER — Encounter (HOSPITAL_COMMUNITY): Payer: BLUE CROSS/BLUE SHIELD

## 2017-06-16 ENCOUNTER — Encounter: Payer: Self-pay | Admitting: Gynecology

## 2017-06-16 ENCOUNTER — Ambulatory Visit (INDEPENDENT_AMBULATORY_CARE_PROVIDER_SITE_OTHER): Payer: BLUE CROSS/BLUE SHIELD | Admitting: Gynecology

## 2017-06-16 ENCOUNTER — Other Ambulatory Visit: Payer: Self-pay | Admitting: Pharmacist

## 2017-06-16 VITALS — BP 122/78 | Ht 66.0 in | Wt 241.0 lb

## 2017-06-16 DIAGNOSIS — Z01411 Encounter for gynecological examination (general) (routine) with abnormal findings: Secondary | ICD-10-CM

## 2017-06-16 DIAGNOSIS — Z1151 Encounter for screening for human papillomavirus (HPV): Secondary | ICD-10-CM

## 2017-06-16 DIAGNOSIS — N952 Postmenopausal atrophic vaginitis: Secondary | ICD-10-CM

## 2017-06-16 LAB — HM PAP SMEAR

## 2017-06-16 MED ORDER — WARFARIN SODIUM 2.5 MG PO TABS: ORAL_TABLET | ORAL | 1 refills | Status: DC

## 2017-06-16 NOTE — Addendum Note (Signed)
Addended by: Nelva Nay on: 06/16/2017 04:14 PM   Modules accepted: Orders

## 2017-06-16 NOTE — Progress Notes (Signed)
    [name] 08/11/1955 [id number]        61 y.o.  G2P2 new patient for annual gynecologic exam.  Has not had a gynecologic exam in a number of years.  Denies any history of GYN abnormalities in the past.  Is not having significant hot flushes or sweats.  No vaginal bleeding.  Past medical history,surgical history, problem list, medications, allergies, family history and social history were all reviewed and documented as reviewed in the EPIC chart.  ROS:  Performed with pertinent positives and negatives included in the history, assessment and plan.   Additional significant findings : None   Exam: Caryn Bee assistant Vitals:   06/16/17 1506  BP: 122/78  Weight: 241 lb (109.3 kg)  Height: 5\' 6"  (1.676 m)   Body mass index is 38.9 kg/m.  General appearance:  Normal affect, orientation and appearance. Skin: Grossly normal HEENT: Without gross lesions.  No cervical or supraclavicular adenopathy. Thyroid normal.  Lungs:  Clear without wheezing, rales or rhonchi Cardiac: RR, without RMG Abdominal:  Soft, nontender, without masses, guarding, rebound, organomegaly or hernia Breasts:  Examined lying and sitting without masses, retractions, discharge or axillary adenopathy. Pelvic:  Ext, BUS, Vagina: With atrophic changes  Cervix: With atrophic changes.  Pap smear/HPV  Uterus: Anteverted, normal size, shape and contour, midline and mobile nontender   Adnexa: Without masses or tenderness    Anus and perineum: Normal   Rectovaginal: Normal sphincter tone without palpated masses or tenderness.    Assessment/Plan:  62 y.o. G2P2 female for annual gynecologic exam.   1. Postmenopausal/atrophic genital changes.  No significant hot flushes, night sweats, vaginal dryness or any bleeding. 2. Pap smear 2004.  Pap smear/HPV today.  No history of abnormal Pap smears previously.  Currently on immunosuppression with Imuran and Plaquenil. 3. Mammography 2015.  Recommended baseline mammogram  now and patient agrees to call and schedule.  Numbers provided.  Breast exam normal today. 4. Colonoscopy 2016.  Repeat at their recommended interval. 5. DEXA 2005.  Recommend baseline DEXA now as patient is on Imuran and Plaquenil.  Patient will schedule in follow-up for this. 6. Health maintenance.  No routine lab work done as patient does this elsewhere.  Follow-up in 1 year, sooner as needed.   Anastasio Auerbach MD, 3:45 PM 06/16/2017

## 2017-06-16 NOTE — Patient Instructions (Signed)
Follow-up for the bone density as scheduled.   Call to Schedule your mammogram   [facility]. [address] Phone: [phone]     Mammogram A mammogram is an X-ray test to find changes in a woman's breast. You should get a mammogram if:  You are 62 years of age or older  You have risk factors.   Your doctor recommends that you have one.  BEFORE THE TEST  Do not schedule the test the week before your period, especially if your breasts are sore during this time.  On the day of your mammogram:  Wash your breasts and armpits well. After washing, do not put on any deodorant or talcum powder on until after your test.   Eat and drink as you usually do.   Take your medicines as usual.   If you are diabetic and take insulin, make sure you:   Eat before coming for your test.   Take your insulin as usual.   If you cannot keep your appointment, call before the appointment to cancel. Schedule another appointment.  TEST  You will need to undress from the waist up. You will put on a hospital gown.   Your breast will be put on the mammogram machine, and it will press firmly on your breast with a piece of plastic called a compression paddle. This will make your breast flatter so that the machine can X-ray all parts of your breast.   Both breasts will be X-rayed. Each breast will be X-rayed from above and from the side. An X-ray might need to be taken again if the picture is not good enough.   The mammogram will last about 15 to 30 minutes.  AFTER THE TEST Finding out the results of your test Ask when your test results will be ready. Make sure you get your test results.  Document Released: 06/06/2008 Document Revised: 02/27/2011 Document Reviewed: 06/06/2008 [facility] Patient Information 2012 [place].

## 2017-06-17 ENCOUNTER — Encounter (HOSPITAL_COMMUNITY): Payer: BLUE CROSS/BLUE SHIELD

## 2017-06-17 LAB — PAP IG AND HPV HIGH-RISK: HPV DNA High Risk: NOT DETECTED

## 2017-06-19 ENCOUNTER — Encounter (HOSPITAL_COMMUNITY): Payer: BLUE CROSS/BLUE SHIELD

## 2017-06-19 ENCOUNTER — Ambulatory Visit (INDEPENDENT_AMBULATORY_CARE_PROVIDER_SITE_OTHER): Payer: BLUE CROSS/BLUE SHIELD | Admitting: Pharmacist

## 2017-06-19 DIAGNOSIS — Z5181 Encounter for therapeutic drug level monitoring: Secondary | ICD-10-CM

## 2017-06-19 DIAGNOSIS — I9789 Other postprocedural complications and disorders of the circulatory system, not elsewhere classified: Secondary | ICD-10-CM | POA: Diagnosis not present

## 2017-06-19 DIAGNOSIS — Z0000 Encounter for general adult medical examination without abnormal findings: Secondary | ICD-10-CM

## 2017-06-19 DIAGNOSIS — Z9889 Other specified postprocedural states: Secondary | ICD-10-CM

## 2017-06-19 DIAGNOSIS — I4891 Unspecified atrial fibrillation: Secondary | ICD-10-CM

## 2017-06-19 LAB — POCT INR: INR: 1.5

## 2017-06-19 NOTE — Patient Instructions (Signed)
Description   Take 4 tablets today, then start taking 4 tablets daily except 3 tablets on Mondays and Fridays. Recheck INR in 1 week.

## 2017-06-21 NOTE — Progress Notes (Signed)
Cardiology Office Note    Date:  06/22/2017   ID:  [name], DOB 04/05/1955, MRN [id number]  PCP:  Janith Lima, MD  Cardiologist: Sinclair Grooms, MD   No chief complaint on file.   History of Present Illness:  [name] is a 62 y.o. female with lupus erythematosus, coronary artery disease with prior history of stenting and ultimate recent four-vessel coronary bypass grafting 2018, mitral valve annuloplasty at the time of CABG in 2018, postoperative atrial fibrillation, and PAD.  She is doing okay.  She did not do cardiac rehab as requested.  She is gaining weight.  She has incisional paresthesias that are slowly improving.  She is back at work full-time.  Overall she feels well.   Past Medical History:  Diagnosis Date  . Chronic ischemic heart disease, unspecified    w/ stents in mid circumflex and mid RCA in 03, and balloon angioplasty of Diagonal branch 10-03, last cath Sept 08- 2 vessel disease w/ patent stents  . Esophageal reflux   . Hypothyroidism   . Immune thrombocytopenic purpura ([place])   . Lupus erythematosus   . Mitral valve insufficiency and aortic valve insufficiency   . Myocardial infarction ([place])    x 2  . Peripheral vascular disease, unspecified ([place])   . Unspecified disease of pericardium     Past Surgical History:  Procedure Laterality Date  . CHOLECYSTECTOMY, LAPAROSCOPIC  2010  . CORONARY ARTERY BYPASS GRAFT N/A 01/30/2017   Procedure: CORONARY ARTERY BYPASS GRAFTING (CABG), Times four  using the right saphaneous vein, harvested endoscopicly.  and left internal mammary artery .;  Surgeon: Gaye Pollack, MD;  Location: [facility] OR;  Service: Open Heart Surgery;  Laterality: N/A;  . LEFT HEART CATH AND CORONARY ANGIOGRAPHY N/A 01/23/2017   Procedure: LEFT HEART CATH AND CORONARY ANGIOGRAPHY;  Surgeon: Belva Crome, MD;  Location: [place] [facility];  Service: Cardiovascular;  Laterality: N/A;  . MITRAL VALVE REPAIR N/A 01/30/2017   Procedure:  MITRAL VALVE REPAIR (MVR);  Surgeon: Gaye Pollack, MD;  Location: [place];  Service: Open Heart Surgery;  Laterality: N/A;  . plastic surgical repair      dog bite to leg  . SPLENECTOMY  5-04  . TEE WITHOUT CARDIOVERSION N/A 01/27/2017   Procedure: TRANSESOPHAGEAL ECHOCARDIOGRAM (TEE);  Surgeon: Sanda Klein, MD;  Location: [facility];  Service: Cardiovascular;  Laterality: N/A;  . TEE WITHOUT CARDIOVERSION N/A 01/30/2017   Procedure: TRANSESOPHAGEAL ECHOCARDIOGRAM (TEE);  Surgeon: Gaye Pollack, MD;  Location: [facility];  Service: Open Heart Surgery;  Laterality: N/A;  . ULTRASOUND GUIDANCE FOR VASCULAR ACCESS  01/23/2017   Procedure: Ultrasound Guidance For Vascular Access;  Surgeon: Belva Crome, MD;  Location: [place] [facility];  Service: Cardiovascular;;    Current Medications: Outpatient Medications Prior to Visit  Medication Sig Dispense Refill  . aspirin 81 MG chewable tablet Chew 81 mg by mouth daily.     . Aspirin-Acetaminophen-Caffeine (GOODY HEADACHE PO) Take 1 packet by mouth daily as needed.    Marland Kitchen azaTHIOprine (IMURAN) 50 MG tablet Take 50 mg by mouth 2 (two) times daily.    . calcium carbonate (TUMS - DOSED IN MG ELEMENTAL CALCIUM) 500 MG chewable tablet Chew 1 tablet by mouth 2 (two) times daily with a meal.     . hydroxychloroquine (PLAQUENIL) 200 MG tablet Take 200 mg by mouth daily.     Marland Kitchen levothyroxine (SYNTHROID, LEVOTHROID) 75 MCG tablet TAKE 1  TABLET(50 MCG) BY MOUTH DAILY 90 tablet 0  . pantoprazole (PROTONIX) 40 MG tablet TAKE 1 TABLET(40 MG) BY MOUTH DAILY 90 tablet 3  . warfarin (COUMADIN) 2.5 MG tablet TAKE 3-4 TABLETS DAILY AS DIRECTED BY COUMADIN CLINIC 100 tablet 1  . metoprolol tartrate (LOPRESSOR) 25 MG tablet Take 0.5 tablets (12.5 mg total) by mouth 2 (two) times daily. 180 tablet 3  . rosuvastatin (CRESTOR) 20 MG tablet Take 1 tablet (20 mg total) by mouth daily. 90 tablet 3   No facility-administered medications prior to visit.      Allergies:    Patient has no known allergies.   Social History   Socioeconomic History  . Marital status: Single    Spouse name: Not on file  . Number of children: Not on file  . Years of education: Not on file  . Highest education level: Not on file  Occupational History  . Not on file  Social Needs  . Financial resource strain: Not on file  . Food insecurity:    Worry: Not on file    Inability: Not on file  . Transportation needs:    Medical: Not on file    Non-medical: Not on file  Tobacco Use  . Smoking status: Former Smoker    Packs/day: 0.50    Years: 30.00    Pack years: 15.00    Types: Cigarettes    Last attempt to quit: 12/23/2011    Years since quitting: 5.5  . Smokeless tobacco: Never Used  Substance and Sexual Activity  . Alcohol use: No  . Drug use: No  . Sexual activity: Not Currently    Comment: 1st intercourse 62 yo-More than 5 partners  Lifestyle  . Physical activity:    Days per week: Not on file    Minutes per session: Not on file  . Stress: Not on file  Relationships  . Social connections:    Talks on phone: Not on file    Gets together: Not on file    Attends religious service: Not on file    Active member of club or organization: Not on file    Attends meetings of clubs or organizations: Not on file    Relationship status: Not on file  Other Topics Concern  . Not on file  Social History Narrative   HSG,  graduated from [employer] in [place]. In college [name] -grad '13 with relgious studies. [employer] - Fall '13 for MA-divinity. Marrried '73 - 2 years/ divorced. 2 son- '73, '75 - CP.    Occupation: full-time Ship broker. She lives alone with her younger son living with her part-time but he resides in a managed care facility.      Family History:  The patient's family history includes Cancer in her mother and paternal grandfather; Fibromyalgia in her mother; GER disease in her father; Heart attack in her maternal grandmother; Heart disease in her  father; Hypertension in her father; Paget's disease of bone in her mother.   ROS:   Please see the history of present illness.    Did not participate in phase 2 cardiac rehab.  Working long hours.  Not observing low caloric intake. All other systems reviewed and are negative.   PHYSICAL EXAM:   VS:  BP 134/90   Pulse 66   Ht 5\' 7"  (1.702 m)   Wt 244 lb 6.4 oz (110.9 kg)   BMI 38.28 kg/m    GEN: Well nourished, well developed, in no acute distress  HEENT: normal  Neck: no JVD, carotid bruits, or masses Cardiac: RRR; no murmurs, rubs, or gallops,no edema  Respiratory:  clear to auscultation bilaterally, normal work of breathing GI: soft, nontender, nondistended, + BS MS: no deformity or atrophy  Skin: warm and dry, no rash Neuro:  Alert and Oriented x 3, Strength and sensation are intact Psych: euthymic mood, full affect  Wt Readings from Last 3 Encounters:  06/22/17 244 lb 6.4 oz (110.9 kg)  06/16/17 241 lb (109.3 kg)  05/27/17 236 lb (107 kg)      Studies/Labs Reviewed:   EKG:  EKG none  Recent Labs: 11/03/2016: ALT 9 02/03/2017: Magnesium 2.4 02/28/2017: BUN 12; Creatinine, Ser 0.95; Potassium 4.0; Sodium 137 05/27/2017: Hemoglobin 12.5; Platelets 236.0; TSH 0.94   Lipid Panel    Component Value Date/Time   CHOL 110 01/26/2017 0301   CHOL 201 (H) 11/03/2016 1155   TRIG 58 01/26/2017 0301   HDL 46 01/26/2017 0301   HDL 58 11/03/2016 1155   CHOLHDL 2.4 01/26/2017 0301   VLDL 12 01/26/2017 0301   LDLCALC 52 01/26/2017 0301   LDLCALC 129 (H) 11/03/2016 1155   LDLDIRECT 321.8 02/27/2012 1202    Additional studies/ records that were reviewed today include:  Recent continuous monitor did not demonstrate any evidence of atrial fibrillation.    ASSESSMENT:    1. S/P MVR (mitral valve repair)   2. S/P CABG x 4   3. Mixed hyperlipidemia   4. Coronary artery disease involving coronary bypass graft of native heart with angina pectoris ([place])   5. Postoperative  atrial fibrillation ([facility])      PLAN:  In order of problems listed above:  1. No mitral regurgitation heard on ossicle dictation. 2. No angina. 3. LDL target less than 70. 4. Discussed risk modification over time.  The importance of exercise and weight control. 5. No recurrence.  Monitor demonstrated no recurrent arrhythmia.  Discontinue Coumadin.  Clinical follow-up in 6 months.  Call if palpitations suggest recurrent atrial fib.  Medication Adjustments/Labs and Tests Ordered: Current medicines are reviewed at length with the patient today.  Concerns regarding medicines are outlined above.  Medication changes, Labs and Tests ordered today are listed in the Patient Instructions below. Patient Instructions  Medication Instructions:  1) DISCONTINUE Coumadin!  Labwork: None  Testing/Procedures: None  Follow-Up: Your physician wants you to follow-up in: 6 months with Dr. Tamala Julian. You will receive a reminder letter in the mail two months in advance. If you don't receive a letter, please call our office to schedule the follow-up appointment.   Any Other Special Instructions Will Be Listed Below (If Applicable).  Your provider would like for you to increase the amount of aerobic exercise you do weekly.    If you need a refill on your cardiac medications before your next appointment, please call your pharmacy.      Signed, Sinclair Grooms, MD  06/22/2017 12:01 PM    [facility] [address], [place], [place]  [address] Phone: [phone]; Fax: [phone]

## 2017-06-22 ENCOUNTER — Ambulatory Visit (INDEPENDENT_AMBULATORY_CARE_PROVIDER_SITE_OTHER): Payer: BLUE CROSS/BLUE SHIELD | Admitting: Interventional Cardiology

## 2017-06-22 ENCOUNTER — Encounter: Payer: Self-pay | Admitting: Interventional Cardiology

## 2017-06-22 ENCOUNTER — Encounter (HOSPITAL_COMMUNITY): Payer: BLUE CROSS/BLUE SHIELD

## 2017-06-22 VITALS — BP 134/90 | HR 66 | Ht 67.0 in | Wt 244.4 lb

## 2017-06-22 DIAGNOSIS — I25709 Atherosclerosis of coronary artery bypass graft(s), unspecified, with unspecified angina pectoris: Secondary | ICD-10-CM | POA: Diagnosis not present

## 2017-06-22 DIAGNOSIS — Z9889 Other specified postprocedural states: Secondary | ICD-10-CM

## 2017-06-22 DIAGNOSIS — Z951 Presence of aortocoronary bypass graft: Secondary | ICD-10-CM | POA: Diagnosis not present

## 2017-06-22 DIAGNOSIS — I4891 Unspecified atrial fibrillation: Secondary | ICD-10-CM

## 2017-06-22 DIAGNOSIS — E782 Mixed hyperlipidemia: Secondary | ICD-10-CM

## 2017-06-22 DIAGNOSIS — I9789 Other postprocedural complications and disorders of the circulatory system, not elsewhere classified: Secondary | ICD-10-CM

## 2017-06-22 NOTE — Patient Instructions (Signed)
Medication Instructions:  1) DISCONTINUE Coumadin!  Labwork: None  Testing/Procedures: None  Follow-Up: Your physician wants you to follow-up in: 6 months with Dr. Tamala Julian. You will receive a reminder letter in the mail two months in advance. If you don't receive a letter, please call our office to schedule the follow-up appointment.   Any Other Special Instructions Will Be Listed Below (If Applicable).  Your provider would like for you to increase the amount of aerobic exercise you do weekly.    If you need a refill on your cardiac medications before your next appointment, please call your pharmacy.

## 2017-06-23 DIAGNOSIS — I2510 Atherosclerotic heart disease of native coronary artery without angina pectoris: Secondary | ICD-10-CM | POA: Diagnosis not present

## 2017-06-23 DIAGNOSIS — N181 Chronic kidney disease, stage 1: Secondary | ICD-10-CM | POA: Diagnosis not present

## 2017-06-23 DIAGNOSIS — M329 Systemic lupus erythematosus, unspecified: Secondary | ICD-10-CM | POA: Diagnosis not present

## 2017-06-23 DIAGNOSIS — I4891 Unspecified atrial fibrillation: Secondary | ICD-10-CM | POA: Diagnosis not present

## 2017-06-23 DIAGNOSIS — N055 Unspecified nephritic syndrome with diffuse mesangiocapillary glomerulonephritis: Secondary | ICD-10-CM | POA: Diagnosis not present

## 2017-06-23 DIAGNOSIS — Z8639 Personal history of other endocrine, nutritional and metabolic disease: Secondary | ICD-10-CM | POA: Diagnosis not present

## 2017-06-23 DIAGNOSIS — D631 Anemia in chronic kidney disease: Secondary | ICD-10-CM | POA: Diagnosis not present

## 2017-06-23 DIAGNOSIS — I25810 Atherosclerosis of coronary artery bypass graft(s) without angina pectoris: Secondary | ICD-10-CM | POA: Diagnosis not present

## 2017-06-23 DIAGNOSIS — R809 Proteinuria, unspecified: Secondary | ICD-10-CM | POA: Diagnosis not present

## 2017-06-23 LAB — CBC AND DIFFERENTIAL
HCT: 37 (ref 36–46)
Hemoglobin: 12.9 (ref 12.0–16.0)
Platelets: 275 (ref 150–399)
WBC: 2.8

## 2017-06-23 LAB — IRON,TIBC AND FERRITIN PANEL
%SAT: 26
Ferritin: 58
Iron: 386
TIBC: 285

## 2017-06-23 LAB — BASIC METABOLIC PANEL
BUN: 20 (ref 4–21)
Creatinine: 1.4 — AB (ref 0.5–1.1)
Glucose: 68
Potassium: 4.7 (ref 3.4–5.3)
Sodium: 140 (ref 137–147)

## 2017-06-24 ENCOUNTER — Encounter (HOSPITAL_COMMUNITY): Payer: BLUE CROSS/BLUE SHIELD

## 2017-06-26 ENCOUNTER — Encounter (HOSPITAL_COMMUNITY): Payer: BLUE CROSS/BLUE SHIELD

## 2017-06-26 DIAGNOSIS — E669 Obesity, unspecified: Secondary | ICD-10-CM | POA: Diagnosis not present

## 2017-06-26 DIAGNOSIS — Z6839 Body mass index (BMI) 39.0-39.9, adult: Secondary | ICD-10-CM | POA: Diagnosis not present

## 2017-06-26 DIAGNOSIS — Z79899 Other long term (current) drug therapy: Secondary | ICD-10-CM | POA: Diagnosis not present

## 2017-06-26 DIAGNOSIS — M3214 Glomerular disease in systemic lupus erythematosus: Secondary | ICD-10-CM | POA: Diagnosis not present

## 2017-06-26 DIAGNOSIS — M329 Systemic lupus erythematosus, unspecified: Secondary | ICD-10-CM | POA: Diagnosis not present

## 2017-06-29 ENCOUNTER — Encounter (HOSPITAL_COMMUNITY): Payer: BLUE CROSS/BLUE SHIELD

## 2017-07-01 ENCOUNTER — Emergency Department (HOSPITAL_COMMUNITY)
Admission: EM | Admit: 2017-07-01 | Discharge: 2017-07-01 | Disposition: A | Discharge status: Home / Self Care | Payer: BLUE CROSS/BLUE SHIELD | Attending: Emergency Medicine | Admitting: Emergency Medicine

## 2017-07-01 ENCOUNTER — Encounter (HOSPITAL_COMMUNITY): Payer: BLUE CROSS/BLUE SHIELD

## 2017-07-01 ENCOUNTER — Emergency Department (HOSPITAL_COMMUNITY): Payer: BLUE CROSS/BLUE SHIELD

## 2017-07-01 ENCOUNTER — Encounter (HOSPITAL_COMMUNITY): Payer: Self-pay | Admitting: Emergency Medicine

## 2017-07-01 DIAGNOSIS — Z951 Presence of aortocoronary bypass graft: Secondary | ICD-10-CM | POA: Diagnosis not present

## 2017-07-01 DIAGNOSIS — Z87891 Personal history of nicotine dependence: Secondary | ICD-10-CM | POA: Insufficient documentation

## 2017-07-01 DIAGNOSIS — M62838 Other muscle spasm: Secondary | ICD-10-CM | POA: Diagnosis not present

## 2017-07-01 DIAGNOSIS — I252 Old myocardial infarction: Secondary | ICD-10-CM | POA: Insufficient documentation

## 2017-07-01 DIAGNOSIS — R21 Rash and other nonspecific skin eruption: Secondary | ICD-10-CM | POA: Diagnosis not present

## 2017-07-01 DIAGNOSIS — R079 Chest pain, unspecified: Secondary | ICD-10-CM | POA: Insufficient documentation

## 2017-07-01 DIAGNOSIS — I2510 Atherosclerotic heart disease of native coronary artery without angina pectoris: Secondary | ICD-10-CM | POA: Insufficient documentation

## 2017-07-01 DIAGNOSIS — N181 Chronic kidney disease, stage 1: Secondary | ICD-10-CM | POA: Diagnosis not present

## 2017-07-01 DIAGNOSIS — E039 Hypothyroidism, unspecified: Secondary | ICD-10-CM | POA: Insufficient documentation

## 2017-07-01 DIAGNOSIS — Z79899 Other long term (current) drug therapy: Secondary | ICD-10-CM | POA: Insufficient documentation

## 2017-07-01 DIAGNOSIS — Z7982 Long term (current) use of aspirin: Secondary | ICD-10-CM | POA: Diagnosis not present

## 2017-07-01 DIAGNOSIS — M542 Cervicalgia: Secondary | ICD-10-CM | POA: Diagnosis not present

## 2017-07-01 DIAGNOSIS — M25512 Pain in left shoulder: Secondary | ICD-10-CM | POA: Diagnosis present

## 2017-07-01 LAB — PROTIME-INR
INR: 1.09
Prothrombin Time: 14 seconds (ref 11.4–15.2)

## 2017-07-01 LAB — BASIC METABOLIC PANEL
Anion gap: 6 (ref 5–15)
BUN: 15 mg/dL (ref 6–20)
CO2: 25 mmol/L (ref 22–32)
Calcium: 8.9 mg/dL (ref 8.9–10.3)
Chloride: 111 mmol/L (ref 101–111)
Creatinine, Ser: 1.09 mg/dL — ABNORMAL HIGH (ref 0.44–1.00)
GFR calc Af Amer: >60 mL/min (ref >60)
GFR calc non Af Amer: 54 mL/min — ABNORMAL LOW (ref >60)
Glucose, Bld: 91 mg/dL (ref 65–99)
Potassium: 4.2 mmol/L (ref 3.5–5.1)
Sodium: 142 mmol/L (ref 135–145)

## 2017-07-01 LAB — CBC
HCT: 36.1 % (ref 36.0–46.0)
Hemoglobin: 11.9 g/dL — ABNORMAL LOW (ref 12.0–15.0)
MCH: 32 pg (ref 26.0–34.0)
MCHC: 33 g/dL (ref 30.0–36.0)
MCV: 97 fL (ref 78.0–100.0)
Platelets: 248 10*3/uL (ref 150–400)
RBC: 3.72 MIL/uL — ABNORMAL LOW (ref 3.87–5.11)
RDW: 18.3 % — ABNORMAL HIGH (ref 11.5–15.5)
WBC: 2.9 10*3/uL — ABNORMAL LOW (ref 4.0–10.5)

## 2017-07-01 LAB — I-STAT TROPONIN, ED: Troponin i, poc: 0 ng/mL (ref 0.00–0.08)

## 2017-07-01 MED ORDER — CYCLOBENZAPRINE HCL 10 MG PO TABS
5.0000 mg | ORAL_TABLET | Freq: Once | ORAL | Status: AC
  Administered 2017-07-01: 5 mg via ORAL
  Filled 2017-07-01: qty 1

## 2017-07-01 MED ORDER — CYCLOBENZAPRINE HCL 10 MG PO TABS: 5.0000 mg | ORAL_TABLET | Freq: Every day | ORAL | 0 refills | Status: AC

## 2017-07-01 NOTE — ED Provider Notes (Signed)
[facility] DEPT Provider Note  CSN: [id number] Arrival date & time: 07/01/17 7106  Chief Complaint(s) Shoulder Pain and Rash  HPI [name] is a 62 y.o. female   The history is provided by the patient.  Shoulder Pain   This is a new problem. The current episode started 2 days ago. The problem occurs constantly. The problem has not changed since onset.Pain location: left shoulder, neck and chest. The quality of the pain is described as aching and constant. The pain is moderate. Associated symptoms include limited range of motion. The symptoms are aggravated by contact. She has tried OTC ointments for the symptoms. The treatment provided mild relief. There has been no history of extremity trauma.   Concerned for cardiac etiology give her history.  Also noted rash to the left neck. Prior shingles, but does not feel like it. No allergies. No new known exposures.   Past Medical History Past Medical History:  Diagnosis Date  . Chronic ischemic heart disease, unspecified    w/ stents in mid circumflex and mid RCA in 03, and balloon angioplasty of Diagonal branch 10-03, last cath Sept 08- 2 vessel disease w/ patent stents  . Esophageal reflux   . Hypothyroidism   . Immune thrombocytopenic purpura ([place])   . Lupus erythematosus   . Mitral valve insufficiency and aortic valve insufficiency   . Myocardial infarction ([place])    x 2  . Peripheral vascular disease, unspecified ([place])   . Unspecified disease of pericardium    Patient Active Problem List   Diagnosis Date Noted  . Cervical cancer screening 05/27/2017  . Visit for screening mammogram 05/27/2017  . Deficiency anemia 02/24/2017  . Routine general medical examination at a health care facility 02/18/2017  . Postoperative atrial fibrillation ([place]) 02/18/2017  . S/P CABG x 4 01/30/2017  . S/P MVR (mitral valve repair) 01/30/2017  . Osteoarthritis of spine with radiculopathy, lumbar region  06/24/2016  . Hypothyroidism 04/08/2011  . Coronary artery disease involving coronary bypass graft of native heart with angina pectoris ([place]) 02/26/2009  . Hyperlipidemia 01/26/2009  . Immune thrombocytopenic purpura ([place]) 08/05/2007  . DVT 08/05/2007  . GERD 08/05/2007  . Systemic lupus erythematosus with focal and segmental proliferative glomerulonephritis ([place]) 08/05/2007   Home Medication(s) Prior to Admission medications   Medication Sig Start Date End Date Taking? Authorizing Provider  aspirin 81 MG chewable tablet Chew 81 mg by mouth daily.     [provider]  Aspirin-Acetaminophen-Caffeine (GOODY HEADACHE PO) Take 1 packet by mouth daily as needed.    [provider]  azaTHIOprine (IMURAN) 50 MG tablet Take 50 mg by mouth 2 (two) times daily.    [provider]  calcium carbonate (TUMS - DOSED IN MG ELEMENTAL CALCIUM) 500 MG chewable tablet Chew 1 tablet by mouth 2 (two) times daily with a meal.     [provider]  cyclobenzaprine (FLEXERIL) 10 MG tablet Take 0.5-1 tablets (5-10 mg total) by mouth at bedtime for 10 days. 07/01/17 07/11/17  Fatima Blank, MD  hydroxychloroquine (PLAQUENIL) 200 MG tablet Take 200 mg by mouth daily.     [provider]  levothyroxine (SYNTHROID, LEVOTHROID) 75 MCG tablet TAKE 1 TABLET(50 MCG) BY MOUTH DAILY 04/17/17   Janith Lima, MD  metoprolol tartrate (LOPRESSOR) 25 MG tablet Take 0.5 tablets (12.5 mg total) by mouth 2 (two) times daily. 02/20/17 05/21/17  Lyda Jester M, PA-C  pantoprazole (PROTONIX) 40 MG tablet TAKE 1 TABLET(40 MG)  BY MOUTH DAILY 08/04/16   Burnell Blanks, MD  rosuvastatin (CRESTOR) 20 MG tablet Take 1 tablet (20 mg total) by mouth daily. 11/13/16 02/11/17  Burnell Blanks, MD                                                                                                                                    Past Surgical History Past Surgical History:    Procedure Laterality Date  . CHOLECYSTECTOMY, LAPAROSCOPIC  2010  . CORONARY ARTERY BYPASS GRAFT N/A 01/30/2017   Procedure: CORONARY ARTERY BYPASS GRAFTING (CABG), Times four  using the right saphaneous vein, harvested endoscopicly.  and left internal mammary artery .;  Surgeon: Gaye Pollack, MD;  [facility];  Service: Open Heart Surgery;  Laterality: N/A;  . LEFT HEART CATH AND CORONARY ANGIOGRAPHY N/A 01/23/2017   Procedure: LEFT HEART CATH AND CORONARY ANGIOGRAPHY;  Surgeon: Belva Crome, MD;  Location: [facility];  Service: Cardiovascular;  Laterality: N/A;  . MITRAL VALVE REPAIR N/A 01/30/2017   Procedure: MITRAL VALVE REPAIR (MVR);  Surgeon: Gaye Pollack, MD;  Location: [facility];  Service: Open Heart Surgery;  Laterality: N/A;  . plastic surgical repair      dog bite to leg  . SPLENECTOMY  5-04  . TEE WITHOUT CARDIOVERSION N/A 01/27/2017   Procedure: TRANSESOPHAGEAL ECHOCARDIOGRAM (TEE);  Surgeon: Sanda Klein, MD;  Location: [facility] ENDOSCOPY;  Service: Cardiovascular;  Laterality: N/A;  . TEE WITHOUT CARDIOVERSION N/A 01/30/2017   Procedure: TRANSESOPHAGEAL ECHOCARDIOGRAM (TEE);  Surgeon: Gaye Pollack, MD;  Location: [facility];  Service: Open Heart Surgery;  Laterality: N/A;  . ULTRASOUND GUIDANCE FOR VASCULAR ACCESS  01/23/2017   Procedure: Ultrasound Guidance For Vascular Access;  Surgeon: Belva Crome, MD;  Location: [facility];  Service: Cardiovascular;;   Family History Family History  Problem Relation Age of Onset  . Cancer Mother        breast and cervical  . Paget's disease of bone Mother   . Fibromyalgia Mother   . Hypertension Father   . Heart disease Father        PVD  . GER disease Father   . Cancer Paternal Grandfather        colon  . Heart attack Maternal Grandmother     Social History Social History   Tobacco Use  . Smoking status: Former Smoker    Packs/day: 0.50    Years: 30.00    Pack years: 15.00    Types: Cigarettes    Last  attempt to quit: 12/23/2011    Years since quitting: 5.5  . Smokeless tobacco: Never Used  Substance Use Topics  . Alcohol use: No  . Drug use: No   Allergies Patient has no known allergies.  Review of Systems Review of Systems All other systems are reviewed and are negative for acute change except as noted in the HPI  Physical Exam Vital Signs  I have reviewed the triage vital signs BP (!) 145/98   Pulse 70   Temp 98.5 F (36.9 C) (Oral)   Resp 12   Ht 5\' 7"  (1.702 m)   Wt 107.5 kg (237 lb)   SpO2 99%   BMI 37.12 kg/m   Physical Exam  Constitutional: She is oriented to person, place, and time. She appears well-developed and well-nourished. No distress.  HENT:  Head: Normocephalic and atraumatic.  Nose: Nose normal.  Eyes: Pupils are equal, round, and reactive to light. Conjunctivae and EOM are normal. Right eye exhibits no discharge. Left eye exhibits no discharge. No scleral icterus.  Neck: Normal range of motion. Neck supple. Muscular tenderness present.    Cardiovascular: Normal rate and regular rhythm. Exam reveals no gallop and no friction rub.  No murmur heard. Pulmonary/Chest: Effort normal and breath sounds normal. No stridor. No respiratory distress. She has no rales. She exhibits tenderness.    Abdominal: Soft. She exhibits no distension. There is no tenderness.  Musculoskeletal: She exhibits no edema.       Cervical back: She exhibits tenderness, pain and spasm. She exhibits no bony tenderness.       Back:  Neurological: She is alert and oriented to person, place, and time.  Skin: Skin is warm and dry. Rash noted. Rash is papular. She is not diaphoretic. No erythema.     Psychiatric: She has a normal mood and affect.  Vitals reviewed.   ED Results and Treatments Labs (all labs ordered are listed, but only abnormal results are displayed) Labs Reviewed  BASIC METABOLIC PANEL - Abnormal; Notable for the following components:      Result Value    Creatinine, Ser 1.09 (*)    GFR calc non Af Amer 54 (*)    All other components within normal limits  CBC - Abnormal; Notable for the following components:   WBC 2.9 (*)    RBC 3.72 (*)    Hemoglobin 11.9 (*)    RDW 18.3 (*)    All other components within normal limits  PROTIME-INR  I-STAT TROPONIN, ED                                                                                                                         EKG  EKG Interpretation  Date/Time:  Wednesday July 01 2017 18:51:30 EDT Ventricular Rate:  74 PR Interval:    QRS Duration: 93 QT Interval:  415 QTC Calculation: 461 R Axis:   46 Text Interpretation:  Sinus rhythm Consider left atrial enlargement Low voltage, precordial leads Borderline T wave abnormalities No significant change since last tracing Confirmed by Dorie Rank [phone]) on 07/01/2017 7:13:04 PM      Radiology Dg Chest 2 View  Result Date: 07/01/2017 CLINICAL DATA:  Left-sided chest pain radiating to the left arm and neck. EXAM: CHEST - 2 VIEW COMPARISON:  03/23/2017 FINDINGS: The heart size and mediastinal contours are within normal  limits. Median sternotomy sutures are again noted. Both lungs are clear. The visualized skeletal structures are unremarkable. IMPRESSION: No active cardiopulmonary disease. Electronically Signed   By: Ashley Royalty M.D.   On: 07/01/2017 19:28   Pertinent labs & imaging results that were available during my care of the patient were reviewed by me and considered in my medical decision making (see chart for details).  Medications Ordered in ED Medications  cyclobenzaprine (FLEXERIL) tablet 5 mg (has no administration in time range)                                                                                                                                    Procedures Procedures  (including critical care time)  Medical Decision Making / ED Course I have reviewed the nursing notes for this encounter and the patient's prior  records (if available in EHR or on provided paperwork).    Patient here with left shoulder/neck/chest pain most consistent with muscle strain/spasm and highly inconsistent with ACS however given patient's past medical history of quadruple bypass she was concerned that this might be cardiac in nature.  EKG without acute ischemic changes or evidence of pericarditis.  Initial troponin negative.  Given the fact that this been constant for more than 48 hours, I feel that this is sufficient to rule out ACS.  Presentation is not suspicious for pulmonary embolism.  It is not classic for aortic dissection or esophageal perforation. Chest x-ray without evidence suggestive of pneumonia, pneumothorax, pneumomediastinum.  No abnormal contour of the mediastinum to suggest dissection. No evidence of acute injuries.  Discussed symptomatic management.  The patient appears reasonably screened and/or stabilized for discharge and I doubt any other medical condition or other [facility] requiring further screening, evaluation, or treatment in the ED at this time prior to discharge.  The patient is safe for discharge with strict return precautions.   Final Clinical Impression(s) / ED Diagnoses Final diagnoses:  Muscle spasm of left shoulder area  Rash    Disposition: Discharge  Condition: Good  I have discussed the results, Dx and Tx plan with the patient who expressed understanding and agree(s) with the plan. Discharge instructions discussed at great length. The patient was given strict return precautions who verbalized understanding of the instructions. No further questions at time of discharge.    ED Discharge Orders        Ordered    cyclobenzaprine (FLEXERIL) 10 MG tablet  Daily at bedtime     07/01/17 2008       Follow Up: Janith Lima, MD [address] [address] [phone]  In 1 week If symptoms do not improve or  worsen     This chart was dictated using voice  recognition software.  Despite best efforts to proofread,  errors can occur which can change the documentation meaning.   Fatima Blank, MD 07/01/17 2008

## 2017-07-01 NOTE — Discharge Instructions (Signed)
You may use over-the-counter Motrin (Ibuprofen), Acetaminophen (Tylenol), topical muscle creams such as SalonPas, Icy Hot, Bengay, etc. Please stretch, apply heat, and have massage therapy for additional assistance. ° °

## 2017-07-01 NOTE — ED Triage Notes (Signed)
Pt c/o left sided chest pain that radiates to left arm and neck as well as rash that started 2 days ago. Hx quadruple bypass.

## 2017-07-03 ENCOUNTER — Encounter (HOSPITAL_COMMUNITY): Payer: BLUE CROSS/BLUE SHIELD

## 2017-07-03 ENCOUNTER — Encounter: Payer: Self-pay | Admitting: Family

## 2017-07-03 ENCOUNTER — Ambulatory Visit (INDEPENDENT_AMBULATORY_CARE_PROVIDER_SITE_OTHER): Payer: BLUE CROSS/BLUE SHIELD | Admitting: Family

## 2017-07-03 VITALS — BP 144/82 | HR 74 | Temp 98.3°F | Ht 67.0 in | Wt 239.0 lb

## 2017-07-03 DIAGNOSIS — B029 Zoster without complications: Secondary | ICD-10-CM

## 2017-07-03 MED ORDER — VALACYCLOVIR HCL 1 G PO TABS
1000.0000 mg | ORAL_TABLET | Freq: Three times a day (TID) | ORAL | 0 refills | Status: DC
Start: 2017-07-03 — End: 2017-12-02

## 2017-07-03 MED ORDER — HYDROCODONE-ACETAMINOPHEN 5-325 MG PO TABS: 1.0000 | ORAL_TABLET | Freq: Four times a day (QID) | ORAL | 0 refills | Status: DC | PRN

## 2017-07-03 MED ORDER — PREDNISONE 20 MG PO TABS: 40.0000 mg | ORAL_TABLET | Freq: Every day | ORAL | 0 refills | Status: DC

## 2017-07-03 NOTE — Patient Instructions (Signed)
Shingles Shingles, which is also known as herpes zoster, is an infection that causes a painful skin rash and fluid-filled blisters. Shingles is not related to genital herpes, which is a sexually transmitted infection. Shingles only develops in people who:  Have had chickenpox.  Have received the chickenpox vaccine. (This is rare.)  What are the causes? Shingles is caused by varicella-zoster virus (VZV). This is the same virus that causes chickenpox. After exposure to VZV, the virus stays in the body in an inactive (dormant) state. Shingles develops if the virus reactivates. This can happen many years after the initial exposure to VZV. It is not known what causes this virus to reactivate. What increases the risk? People who have had chickenpox or received the chickenpox vaccine are at risk for shingles. Infection is more common in people who:  Are older than age 50.  Have a weakened defense (immune) system, such as those with HIV, AIDS, or cancer.  Are taking medicines that weaken the immune system, such as transplant medicines.  Are under great stress.  What are the signs or symptoms? Early symptoms of this condition include itching, tingling, and pain in an area on your skin. Pain may be described as burning, stabbing, or throbbing. A few days or weeks after symptoms start, a painful red rash appears, usually on one side of the body in a bandlike or beltlike pattern. The rash eventually turns into fluid-filled blisters that break open, scab over, and dry up in about 2-3 weeks. At any time during the infection, you may also develop:  A fever.  Chills.  A headache.  An upset stomach.  How is this diagnosed? This condition is diagnosed with a skin exam. Sometimes, skin or fluid samples are taken from the blisters before a diagnosis is made. These samples are examined under a microscope or sent to a lab for testing. How is this treated? There is no specific cure for this condition.  Your health care provider will probably prescribe medicines to help you manage pain, recover more quickly, and avoid long-term problems. Medicines may include:  Antiviral drugs.  Anti-inflammatory drugs.  Pain medicines.  If the area involved is on your face, you may be referred to a specialist, such as an eye doctor (ophthalmologist) or an ear, nose, and throat (ENT) doctor to help you avoid eye problems, chronic pain, or disability. Follow these instructions at home: Medicines  Take medicines only as directed by your health care provider.  Apply an anti-itch or numbing cream to the affected area as directed by your health care provider. Blister and Rash Care  Take a cool bath or apply cool compresses to the area of the rash or blisters as directed by your health care provider. This may help with pain and itching.  Keep your rash covered with a loose bandage (dressing). Wear loose-fitting clothing to help ease the pain of material rubbing against the rash.  Keep your rash and blisters clean with mild soap and cool water or as directed by your health care provider.  Check your rash every day for signs of infection. These include redness, swelling, and pain that lasts or increases.  Do not pick your blisters.  Do not scratch your rash. General instructions  Rest as directed by your health care provider.  Keep all follow-up visits as directed by your health care provider. This is important.  Until your blisters scab over, your infection can cause chickenpox in people who have never had it or been vaccinated   against it. To prevent this from happening, avoid contact with other people, especially: ? Babies. ? Pregnant women. ? Children who have eczema. ? Elderly people who have transplants. ? People who have chronic illnesses, such as leukemia or AIDS. Contact a health care provider if:  Your pain is not relieved with prescribed medicines.  Your pain does not get better after  the rash heals.  Your rash looks infected. Signs of infection include redness, swelling, and pain that lasts or increases. Get help right away if:  The rash is on your face or nose.  You have facial pain, pain around your eye area, or loss of feeling on one side of your face.  You have ear pain or you have ringing in your ear.  You have loss of taste.  Your condition gets worse. This information is not intended to replace advice given to you by your health care provider. Make sure you discuss any questions you have with your health care provider. Document Released: 03/10/2005 Document Revised: 11/04/2015 Document Reviewed: 01/19/2014 Elsevier Interactive Patient Education  2018 Elsevier Inc.  

## 2017-07-03 NOTE — Progress Notes (Signed)
[name] is a 62 y.o. female with the following history as recorded in EpicCare:  Patient Active Problem List   Diagnosis Date Noted  . Cervical cancer screening 05/27/2017  . Visit for screening mammogram 05/27/2017  . Deficiency anemia 02/24/2017  . Routine general medical examination at a health care facility 02/18/2017  . Postoperative atrial fibrillation ([place]) 02/18/2017  . S/P CABG x 4 01/30/2017  . S/P MVR (mitral valve repair) 01/30/2017  . Osteoarthritis of spine with radiculopathy, lumbar region 06/24/2016  . Hypothyroidism 04/08/2011  . Coronary artery disease involving coronary bypass graft of native heart with angina pectoris ([place]) 02/26/2009  . Hyperlipidemia 01/26/2009  . Immune thrombocytopenic purpura ([place]) 08/05/2007  . DVT 08/05/2007  . GERD 08/05/2007  . Systemic lupus erythematosus with focal and segmental proliferative glomerulonephritis ([place]) 08/05/2007    Current Outpatient Medications  Medication Sig Dispense Refill  . aspirin 81 MG chewable tablet Chew 81 mg by mouth daily.     . Aspirin-Acetaminophen-Caffeine (GOODY HEADACHE PO) Take 1 packet by mouth daily as needed.    Marland Kitchen azaTHIOprine (IMURAN) 50 MG tablet Take 50 mg by mouth 2 (two) times daily.    . calcium carbonate (TUMS - DOSED IN MG ELEMENTAL CALCIUM) 500 MG chewable tablet Chew 1 tablet by mouth 2 (two) times daily with a meal.     . cyclobenzaprine (FLEXERIL) 10 MG tablet Take 0.5-1 tablets (5-10 mg total) by mouth at bedtime for 10 days. 10 tablet 0  . hydroxychloroquine (PLAQUENIL) 200 MG tablet Take 200 mg by mouth daily.     Marland Kitchen levothyroxine (SYNTHROID, LEVOTHROID) 75 MCG tablet TAKE 1 TABLET(50 MCG) BY MOUTH DAILY 90 tablet 0  . pantoprazole (PROTONIX) 40 MG tablet TAKE 1 TABLET(40 MG) BY MOUTH DAILY 90 tablet 3  . HYDROcodone-acetaminophen (NORCO) 5-325 MG tablet Take 1 tablet by mouth every 6 (six) hours as needed for moderate pain. 20 tablet 0  . metoprolol tartrate (LOPRESSOR) 25 MG  tablet Take 0.5 tablets (12.5 mg total) by mouth 2 (two) times daily. 180 tablet 3  . predniSONE (DELTASONE) 20 MG tablet Take 2 tablets (40 mg total) by mouth daily with breakfast. 10 tablet 0  . rosuvastatin (CRESTOR) 20 MG tablet Take 1 tablet (20 mg total) by mouth daily. 90 tablet 3  . valACYclovir (VALTREX) 1000 MG tablet Take 1 tablet (1,000 mg total) by mouth 3 (three) times daily. 21 tablet 0   No current facility-administered medications for this visit.     Allergies: Patient has no known allergies.  Past Medical History:  Diagnosis Date  . Chronic ischemic heart disease, unspecified    w/ stents in mid circumflex and mid RCA in 03, and balloon angioplasty of Diagonal branch 10-03, last cath Sept 08- 2 vessel disease w/ patent stents  . Esophageal reflux   . Hypothyroidism   . Immune thrombocytopenic purpura ([place])   . Lupus erythematosus   . Mitral valve insufficiency and aortic valve insufficiency   . Myocardial infarction ([place])    x 2  . Peripheral vascular disease, unspecified ([place])   . Unspecified disease of pericardium     Past Surgical History:  Procedure Laterality Date  . CHOLECYSTECTOMY, LAPAROSCOPIC  2010  . CORONARY ARTERY BYPASS GRAFT N/A 01/30/2017   Procedure: CORONARY ARTERY BYPASS GRAFTING (CABG), Times four  using the right saphaneous vein, harvested endoscopicly.  and left internal mammary artery .;  Surgeon: Gaye Pollack, MD;  Location[facility];  Service: Open Heart Surgery;  Laterality: N/A;  . LEFT HEART CATH AND CORONARY ANGIOGRAPHY N/A 01/23/2017   Procedure: LEFT HEART CATH AND CORONARY ANGIOGRAPHY;  Surgeon: Belva Crome, MD;  Location: [facility];  Service: Cardiovascular;  Laterality: N/A;  . MITRAL VALVE REPAIR N/A 01/30/2017   Procedure: MITRAL VALVE REPAIR (MVR);  Surgeon: Gaye Pollack, MD;  Location: [facility];  Service: Open Heart Surgery;  Laterality: N/A;  . plastic surgical repair      dog bite to leg  . SPLENECTOMY  5-04  . TEE  WITHOUT CARDIOVERSION N/A 01/27/2017   Procedure: TRANSESOPHAGEAL ECHOCARDIOGRAM (TEE);  Surgeon: Sanda Klein, MD;  Location[facility] ENDOSCOPY;  Service: Cardiovascular;  Laterality: N/A;  . TEE WITHOUT CARDIOVERSION N/A 01/30/2017   Procedure: TRANSESOPHAGEAL ECHOCARDIOGRAM (TEE);  Surgeon: Gaye Pollack, MD;  Location: [facility];  Service: Open Heart Surgery;  Laterality: N/A;  . ULTRASOUND GUIDANCE FOR VASCULAR ACCESS  01/23/2017   Procedure: Ultrasound Guidance For Vascular Access;  Surgeon: Belva Crome, MD;  Location: [facility];  Service: Cardiovascular;;    Family History  Problem Relation Age of Onset  . Cancer Mother        breast and cervical  . Paget's disease of bone Mother   . Fibromyalgia Mother   . Hypertension Father   . Heart disease Father        PVD  . GER disease Father   . Cancer Paternal Grandfather        colon  . Heart attack Maternal Grandmother     Social History   Tobacco Use  . Smoking status: Former Smoker    Packs/day: 0.50    Years: 30.00    Pack years: 15.00    Types: Cigarettes    Last attempt to quit: 12/23/2011    Years since quitting: 5.5  . Smokeless tobacco: Never Used  Substance Use Topics  . Alcohol use: No    Subjective:  2 day history of painful rash on upper back/ left upper chest and left neck; went to ER on Wednesday and was diagnosed with muscle spasm- no rash; rash broke out in the past 24 hours; notes did not sleep at all last night due to pain; did have shingles in her 30s; has history of lupus and recovering from quadruple bypass ( 01/2017);  Objective:  Vitals:   07/03/17 1341  BP: (!) 144/82  Pulse: 74  Temp: 98.3 F (36.8 C)  TempSrc: Oral  SpO2: 98%  Weight: 239 lb (108.4 kg)  Height: 5\' 7"  (1.702 m)    General: Well developed, well nourished, in no acute distress  Skin : Warm and dry. Erythematous, vesicular rash noted on upper left back/ left side of neck; does not cross mid-line of the body Head:  Normocephalic and atraumatic  Lungs: Respirations unlabored; clear to auscultation bilaterally without wheeze, rales, rhonchi  Neurologic: Alert and oriented; speech intact; face symmetrical; moves all extremities well; CNII-XII intact without focal deficit   Assessment:  1. Herpes zoster without complication     Plan:  Rx for Valtrex 1 gm tid x 7 days, Prednisone and Norco; increase fluids, rest and follow-up worse, no better;  No follow-ups on file.  No orders of the defined types were placed in this encounter.   Requested Prescriptions   Signed Prescriptions Disp Refills  . valACYclovir (VALTREX) 1000 MG tablet 21 tablet 0    Sig: Take 1 tablet (1,000 mg total) by mouth 3 (three) times daily.  Marland Kitchen  predniSONE (DELTASONE) 20 MG tablet 10 tablet 0    Sig: Take 2 tablets (40 mg total) by mouth daily with breakfast.  . HYDROcodone-acetaminophen (NORCO) 5-325 MG tablet 20 tablet 0    Sig: Take 1 tablet by mouth every 6 (six) hours as needed for moderate pain.

## 2017-07-06 ENCOUNTER — Encounter (HOSPITAL_COMMUNITY): Payer: BLUE CROSS/BLUE SHIELD

## 2017-07-07 ENCOUNTER — Ambulatory Visit
Admission: RE | Admit: 2017-07-07 | Discharge: 2017-07-07 | Disposition: A | Discharge status: Home / Self Care | Payer: BLUE CROSS/BLUE SHIELD | Source: Ambulatory Visit | Attending: Internal Medicine | Admitting: Internal Medicine

## 2017-07-07 DIAGNOSIS — Z1231 Encounter for screening mammogram for malignant neoplasm of breast: Secondary | ICD-10-CM

## 2017-07-08 ENCOUNTER — Encounter (HOSPITAL_COMMUNITY): Payer: BLUE CROSS/BLUE SHIELD

## 2017-07-09 ENCOUNTER — Other Ambulatory Visit: Payer: Self-pay | Admitting: Internal Medicine

## 2017-07-09 DIAGNOSIS — R928 Other abnormal and inconclusive findings on diagnostic imaging of breast: Secondary | ICD-10-CM

## 2017-07-09 LAB — HM MAMMOGRAPHY

## 2017-07-10 ENCOUNTER — Encounter (HOSPITAL_COMMUNITY): Payer: BLUE CROSS/BLUE SHIELD

## 2017-07-12 ENCOUNTER — Other Ambulatory Visit: Payer: Self-pay | Admitting: Internal Medicine

## 2017-07-13 ENCOUNTER — Encounter (HOSPITAL_COMMUNITY): Payer: BLUE CROSS/BLUE SHIELD

## 2017-07-14 ENCOUNTER — Ambulatory Visit
Admission: RE | Admit: 2017-07-14 | Discharge: 2017-07-14 | Disposition: A | Discharge status: Home / Self Care | Payer: BLUE CROSS/BLUE SHIELD | Source: Ambulatory Visit | Attending: Internal Medicine | Admitting: Internal Medicine

## 2017-07-14 ENCOUNTER — Other Ambulatory Visit: Payer: Self-pay | Admitting: Internal Medicine

## 2017-07-14 DIAGNOSIS — N6320 Unspecified lump in the left breast, unspecified quadrant: Secondary | ICD-10-CM

## 2017-07-14 DIAGNOSIS — R928 Other abnormal and inconclusive findings on diagnostic imaging of breast: Secondary | ICD-10-CM

## 2017-07-14 DIAGNOSIS — N6323 Unspecified lump in the left breast, lower outer quadrant: Secondary | ICD-10-CM | POA: Diagnosis not present

## 2017-07-15 ENCOUNTER — Other Ambulatory Visit: Payer: BLUE CROSS/BLUE SHIELD

## 2017-07-15 ENCOUNTER — Encounter (HOSPITAL_COMMUNITY): Payer: BLUE CROSS/BLUE SHIELD

## 2017-07-17 ENCOUNTER — Encounter (HOSPITAL_COMMUNITY): Payer: BLUE CROSS/BLUE SHIELD

## 2017-07-20 ENCOUNTER — Encounter (HOSPITAL_COMMUNITY): Payer: BLUE CROSS/BLUE SHIELD

## 2017-07-27 DIAGNOSIS — M329 Systemic lupus erythematosus, unspecified: Secondary | ICD-10-CM | POA: Diagnosis not present

## 2017-07-27 DIAGNOSIS — H01009 Unspecified blepharitis unspecified eye, unspecified eyelid: Secondary | ICD-10-CM | POA: Diagnosis not present

## 2017-08-12 ENCOUNTER — Other Ambulatory Visit: Payer: Self-pay | Admitting: Cardiovascular Disease

## 2017-08-26 ENCOUNTER — Telehealth: Payer: Self-pay

## 2017-08-26 MED ORDER — AMOXICILLIN 500 MG PO TABS: 500.0000 mg | ORAL_TABLET | ORAL | 1 refills | Status: DC

## 2017-08-26 NOTE — Telephone Encounter (Signed)
Spoke with pt, she is aware to take antibiotics prior to dental procedure. I notified her that I sent the prescription to her preferred pharmacy , pt verbalized understanding. I notified the requesting office and faxed over a copy of the surgical clearance.

## 2017-08-26 NOTE — Telephone Encounter (Signed)
   Primary Cardiologist: Sinclair Grooms, MD  Chart reviewed as part of pre-operative protocol coverage. Patient has h/o MV repair and CABG. Given valvular history will need SBE prophylaxis. I confirmed with dental office this is local anesthesia only, not IV sedation.  Would NOT stop aspirin in anticipation of deep cleaning procedure (CABG was fairly recent, 01/2017). Dr. Tamala Julian also agrees she should not stop aspirin.   Callback staff, please call dentist office to notify them as above. Please let them know we will send in antibiotics. Then please call patient and inform her she needs antibiotics before any dental procedure. If no allergies are confirmed, please send in amoxicillin 500mg  - take 4 tablets/capsules (2000mg ) by mouth 30-60 minutes prior to dental procedure, #4 with 1 refill, to patient's preferred pharmacy. If they feel is necessary, please fax this note to them (otherwise they may be OK with the verbal update as above).  Charlie Pitter, PA-C 08/26/2017, 4:20 PM

## 2017-08-26 NOTE — Telephone Encounter (Signed)
   [facility] Pre-operative Risk Assessment    Request for surgical clearance:  1. What type of surgery is being performed? SCRP (Deep Cleaning)   2. When is this surgery scheduled? TBD   3. What type of clearance is required (medical clearance vs. Pharmacy clearance to hold med vs. Both)? Medical clearance and Pharmacy  4. Are there any medications that need to be held prior to surgery and how long? Aspirin  5. Practice name and name of physician performing surgery?              [facility]   6. What is your office phone number [phone]   7.   What is your office fax number       [phone]  8.   Anesthesia type (None, local, MAC, general) ?

## 2017-10-07 DIAGNOSIS — M329 Systemic lupus erythematosus, unspecified: Secondary | ICD-10-CM | POA: Diagnosis not present

## 2017-10-07 DIAGNOSIS — Z6841 Body Mass Index (BMI) 40.0 and over, adult: Secondary | ICD-10-CM | POA: Diagnosis not present

## 2017-10-07 DIAGNOSIS — Z79899 Other long term (current) drug therapy: Secondary | ICD-10-CM | POA: Diagnosis not present

## 2017-10-07 DIAGNOSIS — M3214 Glomerular disease in systemic lupus erythematosus: Secondary | ICD-10-CM | POA: Diagnosis not present

## 2017-10-18 ENCOUNTER — Other Ambulatory Visit: Payer: Self-pay | Admitting: Internal Medicine

## 2017-10-19 ENCOUNTER — Other Ambulatory Visit: Payer: Self-pay | Admitting: Gynecology

## 2017-10-19 ENCOUNTER — Encounter: Payer: Self-pay | Admitting: Gynecology

## 2017-10-19 ENCOUNTER — Ambulatory Visit (INDEPENDENT_AMBULATORY_CARE_PROVIDER_SITE_OTHER): Payer: BLUE CROSS/BLUE SHIELD

## 2017-10-19 DIAGNOSIS — Z01411 Encounter for gynecological examination (general) (routine) with abnormal findings: Secondary | ICD-10-CM

## 2017-10-19 DIAGNOSIS — Z13820 Encounter for screening for osteoporosis: Secondary | ICD-10-CM | POA: Diagnosis not present

## 2017-10-22 ENCOUNTER — Encounter: Payer: Self-pay | Admitting: Anesthesiology

## 2017-11-20 ENCOUNTER — Telehealth: Payer: Self-pay | Admitting: Interventional Cardiology

## 2017-11-20 NOTE — Telephone Encounter (Signed)
New Message: ° ° ° °Pt is requesting a call back  °

## 2017-11-20 NOTE — Telephone Encounter (Signed)
Left message to call back  

## 2017-11-27 NOTE — Telephone Encounter (Signed)
Spoke with pt and she states she has been experiencing more frequent SOB.  Occurs with and without exertion.  States over the last 2 weeks her BP has been running a little high.  Swelling in both ankles that improves with elevation.  States BP a few days ago was 136/100.  Denies any issues currently today.  Pt adamant about seeing Dr. Tamala Julian.  Scheduled pt to see Dr. Tamala Julian on 9/11.  Advised if worsening of sx report to ED.  Pt verbalized understanding and was appreciative for call.

## 2017-12-01 ENCOUNTER — Other Ambulatory Visit: Payer: Self-pay | Admitting: Cardiovascular Disease

## 2017-12-01 NOTE — Progress Notes (Signed)
Cardiology Office Note:    Date:  12/02/2017   ID:  [name], DOB 09/07/1955, MRN [id number]  PCP:  Janith Lima, MD  Cardiologist:  Sinclair Grooms, MD   Referring MD: Janith Lima, MD   Chief Complaint  Patient presents with  . Coronary Artery Disease  . Atrial Fibrillation  . Hypertension    History of Present Illness:    [name] is a 62 y.o. female with a hx of with lupus erythematosus, coronary artery disease with prior history of stenting and ultimate recent four-vessel coronary bypass grafting 2018, mitral valve annuloplasty at the time of CABG in 2018, postoperative atrial fibrillation, and PAD.  [name] is doing well.  She denies angina.  She has had no symptomatic recurrences of atrial fibrillation.  She is compliant with her medication regimen.  She has not had a flare of lupus.  Overall she is doing well.  Past Medical History:  Diagnosis Date  . Chronic ischemic heart disease, unspecified    w/ stents in mid circumflex and mid RCA in 03, and balloon angioplasty of Diagonal branch 10-03, last cath Sept 08- 2 vessel disease w/ patent stents  . Esophageal reflux   . Hypothyroidism   . Immune thrombocytopenic purpura ([place])   . Lupus erythematosus   . Mitral valve insufficiency and aortic valve insufficiency   . Myocardial infarction ([place])    x 2  . Peripheral vascular disease, unspecified ([place])   . Unspecified disease of pericardium     Past Surgical History:  Procedure Laterality Date  . CHOLECYSTECTOMY, LAPAROSCOPIC  2010  . CORONARY ARTERY BYPASS GRAFT N/A 01/30/2017   Procedure: CORONARY ARTERY BYPASS GRAFTING (CABG), Times four  using the right saphaneous vein, harvested endoscopicly.  and left internal mammary artery .;  Surgeon: Gaye Pollack, MD;  [facility];  Service: Open Heart Surgery;  Laterality: N/A;  . LEFT HEART CATH AND CORONARY ANGIOGRAPHY N/A 01/23/2017   Procedure: LEFT HEART CATH AND CORONARY ANGIOGRAPHY;   Surgeon: Belva Crome, MD;  Location: [place] [facility];  Service: Cardiovascular;  Laterality: N/A;  . MITRAL VALVE REPAIR N/A 01/30/2017   Procedure: MITRAL VALVE REPAIR (MVR);  Surgeon: Gaye Pollack, MD;  Location: [place];  Service: Open Heart Surgery;  Laterality: N/A;  . plastic surgical repair      dog bite to leg  . SPLENECTOMY  5-04  . TEE WITHOUT CARDIOVERSION N/A 01/27/2017   Procedure: TRANSESOPHAGEAL ECHOCARDIOGRAM (TEE);  Surgeon: Sanda Klein, MD;  Location: [facility] ENDOSCOPY;  Service: Cardiovascular;  Laterality: N/A;  . TEE WITHOUT CARDIOVERSION N/A 01/30/2017   Procedure: TRANSESOPHAGEAL ECHOCARDIOGRAM (TEE);  Surgeon: Gaye Pollack, MD;  Location: [place];  Service: Open Heart Surgery;  Laterality: N/A;  . ULTRASOUND GUIDANCE FOR VASCULAR ACCESS  01/23/2017   Procedure: Ultrasound Guidance For Vascular Access;  Surgeon: Belva Crome, MD;  Location: [place] [facility];  Service: Cardiovascular;;    Current Medications: Current Meds  Medication Sig  . aspirin 81 MG chewable tablet Chew 81 mg by mouth daily.   Marland Kitchen azaTHIOprine (IMURAN) 50 MG tablet Take 50 mg by mouth 2 (two) times daily.  . calcium carbonate (TUMS - DOSED IN MG ELEMENTAL CALCIUM) 500 MG chewable tablet Chew 1 tablet by mouth 2 (two) times daily with a meal.   . HYDROcodone-acetaminophen (NORCO) 5-325 MG tablet Take 1 tablet by mouth every 6 (six) hours as needed for moderate pain.  Marland Kitchen  hydrocortisone 2.5 % ointment Apply 1 application topically daily.  . hydroxychloroquine (PLAQUENIL) 200 MG tablet Take 200 mg by mouth daily.   Marland Kitchen levothyroxine (SYNTHROID, LEVOTHROID) 75 MCG tablet TAKE 1 TABLET(50 MCG) BY MOUTH DAILY  . pantoprazole (PROTONIX) 40 MG tablet TAKE 1 TABLET BY MOUTH EVERY DAY  . rosuvastatin (CRESTOR) 20 MG tablet TAKE 1 TABLET BY MOUTH EVERY DAY  . [DISCONTINUED] metoprolol tartrate (LOPRESSOR) 25 MG tablet Take 12.5 mg by mouth 2 (two) times daily.     Allergies:   Patient has no known  allergies.   Social History   Socioeconomic History  . Marital status: Single    Spouse name: Not on file  . Number of children: Not on file  . Years of education: Not on file  . Highest education level: Not on file  Occupational History  . Not on file  Social Needs  . Financial resource strain: Not on file  . Food insecurity:    Worry: Not on file    Inability: Not on file  . Transportation needs:    Medical: Not on file    Non-medical: Not on file  Tobacco Use  . Smoking status: Former Smoker    Packs/day: 0.50    Years: 30.00    Pack years: 15.00    Types: Cigarettes    Last attempt to quit: 12/23/2011    Years since quitting: 5.9  . Smokeless tobacco: Never Used  Substance and Sexual Activity  . Alcohol use: No  . Drug use: No  . Sexual activity: Not Currently    Comment: 1st intercourse 62 yo-More than 5 partners  Lifestyle  . Physical activity:    Days per week: Not on file    Minutes per session: Not on file  . Stress: Not on file  Relationships  . Social connections:    Talks on phone: Not on file    Gets together: Not on file    Attends religious service: Not on file    Active member of club or organization: Not on file    Attends meetings of clubs or organizations: Not on file    Relationship status: Not on file  Other Topics Concern  . Not on file  Social History Narrative   HSG,  graduated from [employer] in [place]. In college [name] -grad '13 with relgious studies. [employer] - Fall '13 for MA-divinity. Marrried '73 - 2 years/ divorced. 2 son- '73, '75 - CP.    Occupation: full-time Ship broker. She lives alone with her younger son living with her part-time but he resides in a managed care facility.      Family History: The patient's family history includes Cancer in her mother and paternal grandfather; Fibromyalgia in her mother; GER disease in her father; Heart attack in her maternal grandmother; Heart disease in her father; Hypertension  in her father; Paget's disease of bone in her mother.  ROS:   Please see the history of present illness.    Ankle swelling late in the day, shortness of breath lying down, excessive fatigue, back pain, rash, and headaches.  All other systems reviewed and are negative.  EKGs/Labs/Other Studies Reviewed:    The following studies were reviewed today: None  EKG:  EKG is not ordered today.  The ekg ordered today demonstrates sinus rhythm with normal appearance.  Recent Labs: 02/03/2017: Magnesium 2.4 05/27/2017: TSH 0.94 07/01/2017: BUN 15; Creatinine, Ser 1.09; Hemoglobin 11.9; Platelets 248; Potassium 4.2; Sodium 142  Recent Lipid  Panel    Component Value Date/Time   CHOL 110 01/26/2017 0301   CHOL 201 (H) 11/03/2016 1155   TRIG 58 01/26/2017 0301   HDL 46 01/26/2017 0301   HDL 58 11/03/2016 1155   CHOLHDL 2.4 01/26/2017 0301   VLDL 12 01/26/2017 0301   LDLCALC 52 01/26/2017 0301   LDLCALC 129 (H) 11/03/2016 1155   LDLDIRECT 321.8 02/27/2012 1202    Physical Exam:    VS:  BP 120/80   Pulse 63   Ht '5\' 7"'$  (1.702 m)   Wt 247 lb 12.8 oz (112.4 kg)   BMI 38.81 kg/m     Wt Readings from Last 3 Encounters:  12/02/17 247 lb 12.8 oz (112.4 kg)  07/03/17 239 lb (108.4 kg)  07/01/17 237 lb (107.5 kg)     GEN: Obese.  Well nourished, well developed in no acute distress HEENT: Normal NECK: No JVD. LYMPHATICS: No lymphadenopathy CARDIAC: RRR, no murmur, no gallop, no  edema. VASCULAR: No pulses.  No bruits. RESPIRATORY:  Clear to auscultation without rales, wheezing or rhonchi  ABDOMEN: Soft, non-tender, non-distended, No pulsatile mass, MUSCULOSKELETAL: No deformity  SKIN: Warm and dry NEUROLOGIC:  Alert and oriented x 3 PSYCHIATRIC:  Normal affect   ASSESSMENT:    1. Coronary artery disease involving coronary bypass graft of native heart with angina pectoris ([place])   2. S/P MVR (mitral valve repair)   3. Essential hypertension   4. Mixed hyperlipidemia   5.  Postoperative atrial fibrillation (HCC)   6. Systemic lupus erythematosus with focal and segmental proliferative glomerulonephritis ([place])    PLAN:    In order of problems listed above:  1. Overall, she is doing well without ischemic symptoms.  Her main management strategy will be risk modification to prevent secondary events: LDL less than 70, blood pressure 130/80 mmHg, A1c less than 7, moderate aerobic activity (at least 150 minutes/week), and weight control. 2. No evidence of regurgitation. 3. Increase metoprolol to the succinate preparation, 50 mg/day.  Record blood pressures at least 2 hours after medication and give [name] a call with results over the next month.  May need to further increase the dose.  Therapy prior to heart surgery was a atenolol 25 mg/day.  Discontinue metoprolol tartrate. 4. LDL target less than 70.  We will check a lipid panel today. 5. No recurrence of atrial fibrillation.  Overall doing well.  Encourage physical activity.  Clinical follow-up with me in 1 year.   Medication Adjustments/Labs and Tests Ordered: Current medicines are reviewed at length with the patient today.  Concerns regarding medicines are outlined above.  Orders Placed This Encounter  Procedures  . Lipid Profile  . Comp Met (CMET)  . EKG 12-Lead   Meds ordered this encounter  Medications  . metoprolol succinate (TOPROL-XL) 50 MG 24 hr tablet    Sig: Take 1 tablet (50 mg total) by mouth daily. Take with or immediately following a meal.    Dispense:  90 tablet    Refill:  3    Pt to stop metoprolol tartrate    Patient Instructions  Medication Instructions:  Your physician has recommended you make the following change in your medication:  Stop metoprolol tartrate. Start metoprolol succinate 50 mg by mouth daily   Labwork: Lab work to be done today--CMET and lipid profile  Testing/Procedures: none  Follow-Up: Your physician wants you to follow-up in: 12 months.  You will receive a  reminder letter in the mail two months in advance.  If you don't receive a letter, please call our office to schedule the follow-up appointment.   Any Other Special Instructions Will Be Listed Below (If Applicable).  Check Blood pressure 2-3 times per week and keep record of these readings. Call us in one month with the readings.    If you need a refill on your cardiac medications before your next appointment, please call your pharmacy.      Signed, Sinclair Grooms, MD  12/02/2017 1:17 PM    [facility]

## 2017-12-02 ENCOUNTER — Encounter: Payer: Self-pay | Admitting: Interventional Cardiology

## 2017-12-02 ENCOUNTER — Ambulatory Visit (INDEPENDENT_AMBULATORY_CARE_PROVIDER_SITE_OTHER): Payer: BLUE CROSS/BLUE SHIELD | Admitting: Interventional Cardiology

## 2017-12-02 VITALS — BP 120/80 | HR 63 | Ht 67.0 in | Wt 247.8 lb

## 2017-12-02 DIAGNOSIS — I10 Essential (primary) hypertension: Secondary | ICD-10-CM | POA: Diagnosis not present

## 2017-12-02 DIAGNOSIS — M3214 Glomerular disease in systemic lupus erythematosus: Secondary | ICD-10-CM

## 2017-12-02 DIAGNOSIS — I4891 Unspecified atrial fibrillation: Secondary | ICD-10-CM | POA: Diagnosis not present

## 2017-12-02 DIAGNOSIS — E782 Mixed hyperlipidemia: Secondary | ICD-10-CM | POA: Diagnosis not present

## 2017-12-02 DIAGNOSIS — Z9889 Other specified postprocedural states: Secondary | ICD-10-CM

## 2017-12-02 DIAGNOSIS — I25709 Atherosclerosis of coronary artery bypass graft(s), unspecified, with unspecified angina pectoris: Secondary | ICD-10-CM | POA: Diagnosis not present

## 2017-12-02 DIAGNOSIS — I9789 Other postprocedural complications and disorders of the circulatory system, not elsewhere classified: Secondary | ICD-10-CM | POA: Diagnosis not present

## 2017-12-02 LAB — COMPREHENSIVE METABOLIC PANEL
ALT: 10 IU/L (ref 0–32)
AST: 20 IU/L (ref 0–40)
Albumin/Globulin Ratio: 1.5 (ref 1.2–2.2)
Albumin: 3.7 g/dL (ref 3.6–4.8)
Alkaline Phosphatase: 87 IU/L (ref 39–117)
BUN/Creatinine Ratio: 15 (ref 12–28)
BUN: 16 mg/dL (ref 8–27)
Bilirubin Total: 0.3 mg/dL (ref 0.0–1.2)
CO2: 23 mmol/L (ref 20–29)
Calcium: 9.2 mg/dL (ref 8.7–10.3)
Chloride: 103 mmol/L (ref 96–106)
Creatinine, Ser: 1.08 mg/dL — ABNORMAL HIGH (ref 0.57–1.00)
GFR calc Af Amer: 64 mL/min/{1.73_m2} (ref >59)
GFR calc non Af Amer: 56 mL/min/{1.73_m2} — ABNORMAL LOW (ref >59)
Globulin, Total: 2.5 g/dL (ref 1.5–4.5)
Glucose: 72 mg/dL (ref 65–99)
Potassium: 4.8 mmol/L (ref 3.5–5.2)
Sodium: 139 mmol/L (ref 134–144)
Total Protein: 6.2 g/dL (ref 6.0–8.5)

## 2017-12-02 LAB — LIPID PANEL
Chol/HDL Ratio: 2.7 ratio (ref 0.0–4.4)
Cholesterol, Total: 141 mg/dL (ref 100–199)
HDL: 53 mg/dL (ref >39)
LDL Calculated: 76 mg/dL (ref 0–99)
Triglycerides: 61 mg/dL (ref 0–149)
VLDL Cholesterol Cal: 12 mg/dL (ref 5–40)

## 2017-12-02 MED ORDER — METOPROLOL SUCCINATE ER 50 MG PO TB24: 50.0000 mg | ORAL_TABLET | Freq: Every day | ORAL | 3 refills | Status: DC

## 2017-12-02 NOTE — Patient Instructions (Signed)
Medication Instructions:  Your physician has recommended you make the following change in your medication:  Stop metoprolol tartrate. Start metoprolol succinate 50 mg by mouth daily   Labwork: Lab work to be done today--CMET and lipid profile  Testing/Procedures: none  Follow-Up: Your physician wants you to follow-up in: 12 months.  You will receive a reminder letter in the mail two months in advance. If you don't receive a letter, please call our office to schedule the follow-up appointment.   Any Other Special Instructions Will Be Listed Below (If Applicable).  Check Blood pressure 2-3 times per week and keep record of these readings. Call us in one month with the readings.    If you need a refill on your cardiac medications before your next appointment, please call your pharmacy.

## 2017-12-08 DIAGNOSIS — K5904 Chronic idiopathic constipation: Secondary | ICD-10-CM | POA: Diagnosis not present

## 2017-12-08 DIAGNOSIS — K219 Gastro-esophageal reflux disease without esophagitis: Secondary | ICD-10-CM | POA: Diagnosis not present

## 2017-12-08 DIAGNOSIS — Z1211 Encounter for screening for malignant neoplasm of colon: Secondary | ICD-10-CM | POA: Diagnosis not present

## 2017-12-16 ENCOUNTER — Other Ambulatory Visit: Payer: Self-pay | Admitting: Internal Medicine

## 2017-12-28 ENCOUNTER — Ambulatory Visit: Payer: BLUE CROSS/BLUE SHIELD | Admitting: Cardiology

## 2018-01-07 DIAGNOSIS — Z79899 Other long term (current) drug therapy: Secondary | ICD-10-CM | POA: Diagnosis not present

## 2018-01-07 DIAGNOSIS — Z23 Encounter for immunization: Secondary | ICD-10-CM | POA: Diagnosis not present

## 2018-01-07 DIAGNOSIS — M3214 Glomerular disease in systemic lupus erythematosus: Secondary | ICD-10-CM | POA: Diagnosis not present

## 2018-01-07 DIAGNOSIS — Z6841 Body Mass Index (BMI) 40.0 and over, adult: Secondary | ICD-10-CM | POA: Diagnosis not present

## 2018-01-07 DIAGNOSIS — M329 Systemic lupus erythematosus, unspecified: Secondary | ICD-10-CM | POA: Diagnosis not present

## 2018-01-07 DIAGNOSIS — E669 Obesity, unspecified: Secondary | ICD-10-CM | POA: Diagnosis not present

## 2018-01-14 ENCOUNTER — Other Ambulatory Visit: Payer: BLUE CROSS/BLUE SHIELD

## 2018-01-14 ENCOUNTER — Telehealth: Payer: Self-pay | Admitting: Internal Medicine

## 2018-01-14 NOTE — Progress Notes (Deleted)
Cardiology Office Note   Date:  01/14/2018   ID:  [name], DOB 1955-04-03, MRN [id number]  PCP:  Meghan Lima, MD  Cardiologist:  Dr. Tamala Julian    No chief complaint on file.     History of Present Illness: [name] is a 62 y.o. female who presents for ***  hx of with lupus erythematosus, coronary artery disease with prior history of stenting and ultimate recent four-vessel coronary bypass grafting 2018, mitral valve annuloplasty at the time of CABG in 2018, postoperative atrial fibrillation, and PAD.  [name] is doing well.  She denies angina.  She has had no symptomatic recurrences of atrial fibrillation.  She is compliant with her medication regimen.  She has not had a flare of lupus.  Overall she is doing well. BP was elebated and toprol was increased.  May need further increase.   Lipds.   Past Medical History:  Diagnosis Date  . Chronic ischemic heart disease, unspecified    w/ stents in mid circumflex and mid RCA in 03, and balloon angioplasty of Diagonal branch 10-03, last cath Sept 08- 2 vessel disease w/ patent stents  . Esophageal reflux   . Hypothyroidism   . Immune thrombocytopenic purpura ([place])   . Lupus erythematosus   . Mitral valve insufficiency and aortic valve insufficiency   . Myocardial infarction ([place])    x 2  . Peripheral vascular disease, unspecified ([place])   . Unspecified disease of pericardium     Past Surgical History:  Procedure Laterality Date  . CHOLECYSTECTOMY, LAPAROSCOPIC  2010  . CORONARY ARTERY BYPASS GRAFT N/A 01/30/2017   Procedure: CORONARY ARTERY BYPASS GRAFTING (CABG), Times four  using the right saphaneous vein, harvested endoscopicly.  and left internal mammary artery .;  Surgeon: Gaye Pollack, MD;  Location: [facility];  Service: Open Heart Surgery;  Laterality: N/A;  . LEFT HEART CATH AND CORONARY ANGIOGRAPHY N/A 01/23/2017   Procedure: LEFT HEART CATH AND CORONARY ANGIOGRAPHY;  Surgeon: Belva Crome, MD;   Location: [place] [facility];  Service: Cardiovascular;  Laterality: N/A;  . MITRAL VALVE REPAIR N/A 01/30/2017   Procedure: MITRAL VALVE REPAIR (MVR);  Surgeon: Gaye Pollack, MD;  Location: [place];  Service: Open Heart Surgery;  Laterality: N/A;  . plastic surgical repair      dog bite to leg  . SPLENECTOMY  5-04  . TEE WITHOUT CARDIOVERSION N/A 01/27/2017   Procedure: TRANSESOPHAGEAL ECHOCARDIOGRAM (TEE);  Surgeon: Sanda Klein, MD;  Location: [facility] ENDOSCOPY;  Service: Cardiovascular;  Laterality: N/A;  . TEE WITHOUT CARDIOVERSION N/A 01/30/2017   Procedure: TRANSESOPHAGEAL ECHOCARDIOGRAM (TEE);  Surgeon: Gaye Pollack, MD;  Location: [place];  Service: Open Heart Surgery;  Laterality: N/A;  . ULTRASOUND GUIDANCE FOR VASCULAR ACCESS  01/23/2017   Procedure: Ultrasound Guidance For Vascular Access;  Surgeon: Belva Crome, MD;  Location: [place] [facility];  Service: Cardiovascular;;     Current Outpatient Medications  Medication Sig Dispense Refill  . aspirin 81 MG chewable tablet Chew 81 mg by mouth daily.     Marland Kitchen azaTHIOprine (IMURAN) 50 MG tablet Take 50 mg by mouth 2 (two) times daily.    . calcium carbonate (TUMS - DOSED IN MG ELEMENTAL CALCIUM) 500 MG chewable tablet Chew 1 tablet by mouth 2 (two) times daily with a meal.     . HYDROcodone-acetaminophen (NORCO) 5-325 MG tablet Take 1 tablet by mouth every 6 (six) hours as needed for moderate pain.  20 tablet 0  . hydrocortisone 2.5 % ointment Apply 1 application topically daily.  1  . hydroxychloroquine (PLAQUENIL) 200 MG tablet Take 200 mg by mouth daily.     Marland Kitchen levothyroxine (SYNTHROID, LEVOTHROID) 75 MCG tablet TAKE 1 TABLET(50 MCG) BY MOUTH DAILY 30 tablet 2  . metoprolol succinate (TOPROL-XL) 50 MG 24 hr tablet Take 1 tablet (50 mg total) by mouth daily. Take with or immediately following a meal. 90 tablet 3  . pantoprazole (PROTONIX) 40 MG tablet TAKE 1 TABLET BY MOUTH EVERY DAY 90 tablet 3  . rosuvastatin (CRESTOR) 20 MG tablet  TAKE 1 TABLET BY MOUTH EVERY DAY 90 tablet 2   No current facility-administered medications for this visit.     Allergies:   Patient has no known allergies.    Social History:  The patient  reports that she quit smoking about 6 years ago. Her smoking use included cigarettes. She has a 15.00 pack-year smoking history. She has never used smokeless tobacco. She reports that she does not drink alcohol or use drugs.   Family History:  The patient's ***family history includes Cancer in her mother and paternal grandfather; Fibromyalgia in her mother; GER disease in her father; Heart attack in her maternal grandmother; Heart disease in her father; Hypertension in her father; Paget's disease of bone in her mother.    ROS:  General:no colds or fevers, no weight changes Skin:no rashes or ulcers HEENT:no blurred vision, no congestion CV:see HPI PUL:see HPI GI:no diarrhea constipation or melena, no indigestion GU:no hematuria, no dysuria MS:no joint pain, no claudication Neuro:no syncope, no lightheadedness Endo:no diabetes, no thyroid disease Wt Readings from Last 3 Encounters:  12/02/17 247 lb 12.8 oz (112.4 kg)  07/03/17 239 lb (108.4 kg)  07/01/17 237 lb (107.5 kg)     PHYSICAL EXAM: VS:  There were no vitals taken for this visit. , BMI There is no height or weight on file to calculate BMI. General:Meghan affect, NAD Skin:Warm and dry, brisk capillary refill HEENT:normocephalic, sclera clear, mucus membranes moist Neck:supple, no JVD, no bruits  Heart:S1S2 RRR without murmur, gallup, rub or click Lungs:clear without rales, rhonchi, or wheezes UVO:ZDGU, non tender, + BS, do not palpate liver spleen or masses Ext:no lower ext edema, 2+ pedal pulses, 2+ radial pulses Neuro:alert and oriented, MAE, follows commands, + facial symmetry    EKG:  EKG is ordered today. The ekg ordered today demonstrates ***   Recent Labs: 02/03/2017: Magnesium 2.4 05/27/2017: TSH 0.94 07/01/2017:  Hemoglobin 11.9; Platelets 248 12/02/2017: ALT 10; BUN 16; Creatinine, Ser 1.08; Potassium 4.8; Sodium 139    Lipid Panel    Component Value Date/Time   CHOL 141 12/02/2017 1124   TRIG 61 12/02/2017 1124   HDL 53 12/02/2017 1124   CHOLHDL 2.7 12/02/2017 1124   CHOLHDL 2.4 01/26/2017 0301   VLDL 12 01/26/2017 0301   LDLCALC 76 12/02/2017 1124   LDLDIRECT 321.8 02/27/2012 1202       Other studies Reviewed: Additional studies/ records that were reviewed today include: ***.   ASSESSMENT AND PLAN:  1.  ***   Current medicines are reviewed with the patient today.  The patient Has no concerns regarding medicines.  The following changes have been made:  See above Labs/ tests ordered today include:see above  Disposition:   FU:  see above  Signed, Cecilie Kicks, NP  01/14/2018 6:47 PM    [facility] [address] [place], [place]  [place], [place] Phone: [phone]; Fax: [phone]

## 2018-01-15 ENCOUNTER — Ambulatory Visit: Payer: BLUE CROSS/BLUE SHIELD | Admitting: Cardiology

## 2018-01-15 ENCOUNTER — Telehealth: Payer: Self-pay | Admitting: Interventional Cardiology

## 2018-01-15 NOTE — Telephone Encounter (Signed)
Pt called and scheduled med check OV on 11/13.  Pt would like to know if medication can be refilled at this time.

## 2018-01-15 NOTE — Telephone Encounter (Signed)
° °  Patient calling to request appt with Dr Tamala Julian to discuss Metoprolol and BP. Patient had an appt for today that was canceled  Appointment rescheduled for Jan. Patient is NOT requesting a call back from nurse.  Patient states her BP has been better since taking medication.  1. What are your last 5 BP readings? 128/79 today, patient states a few weeks ago BP was 145/100  2. Are you having any other symptoms (ex. Dizziness, headache, blurred vision, passed out)? no  3. What is your BP issue?

## 2018-01-15 NOTE — Telephone Encounter (Signed)
Spoke with pt and she states that the BP of 145/100 was prior to seeing Dr. Tamala Julian on 12/02/17.  She states she was calling today because Dr. Tamala Julian has asked her to monitor her BP and come in to follow up after a month.  Advised pt he wanted her to call with those readings and that she wouldn't need to come in.  Pt did not have readings on her at this time.  Advised pt to call back with those readings when she has them available.  Pt verbalized understanding and was in agreement with this plan.

## 2018-01-15 NOTE — Telephone Encounter (Signed)
Left detailed message informing that rx has refills and to contact pharmacy to have them fill rx.

## 2018-01-18 ENCOUNTER — Telehealth: Payer: Self-pay

## 2018-01-18 NOTE — Telephone Encounter (Signed)
   [facility] Pre-operative Risk Assessment    Request for surgical clearance:  1. What type of surgery is being performed? colonoscopy   2. When is this surgery scheduled? 02/05/18   3. What type of clearance is required (medical clearance vs. Pharmacy clearance to hold med vs. Both)? Medical clearance   4. Are there any medications that need to be held prior to surgery and how long? None noted-Pt takes ASA 81 mg   5. Practice name and name of physician performing surgery? [facility] Collene Mares at [facility]   6. What is your office phone number  [phone]    7.   What is your office fax number  [phone]  8.   Anesthesia type (None, local, MAC, general)  propofol   Damian Leavell 01/18/2018, 10:16 AM  _________________________________________________________________   (provider comments below)

## 2018-01-18 NOTE — Telephone Encounter (Signed)
   Primary Cardiologist: Sinclair Grooms, MD  Chart reviewed as part of pre-operative protocol coverage. Given past medical history and time since last visit, based on ACC/AHA guidelines, MAYGEN SIRICO would be at acceptable risk for the planned procedure without further cardiovascular testing.  BP improving after medications changes.   Dr. Tamala Julian, can patient hold ASA if required and how long? Please forward your response to [facility] PREOP.   Thank you  Leanor Kail, PA 01/18/2018, 2:42 PM

## 2018-01-19 ENCOUNTER — Other Ambulatory Visit: Payer: Self-pay | Admitting: Internal Medicine

## 2018-01-19 ENCOUNTER — Ambulatory Visit
Admission: RE | Admit: 2018-01-19 | Discharge: 2018-01-19 | Disposition: A | Discharge status: Home / Self Care | Payer: BLUE CROSS/BLUE SHIELD | Source: Ambulatory Visit | Attending: Internal Medicine | Admitting: Internal Medicine

## 2018-01-19 DIAGNOSIS — N6320 Unspecified lump in the left breast, unspecified quadrant: Secondary | ICD-10-CM

## 2018-01-19 DIAGNOSIS — N6321 Unspecified lump in the left breast, upper outer quadrant: Secondary | ICD-10-CM | POA: Diagnosis not present

## 2018-01-19 DIAGNOSIS — R928 Other abnormal and inconclusive findings on diagnostic imaging of breast: Secondary | ICD-10-CM | POA: Diagnosis not present

## 2018-01-19 DIAGNOSIS — N6323 Unspecified lump in the left breast, lower outer quadrant: Secondary | ICD-10-CM | POA: Diagnosis not present

## 2018-01-19 LAB — HM MAMMOGRAPHY

## 2018-01-19 NOTE — Telephone Encounter (Signed)
No problem with holding aspirin for the procedure. Will need endocarditis prophylaxis because the patient also had mitral valve repair

## 2018-01-19 NOTE — Telephone Encounter (Signed)
No problem with holding aspirin for the procedure. Will need endocarditis prophylaxis because the patient also had mitral valve repair.

## 2018-01-20 ENCOUNTER — Other Ambulatory Visit: Payer: Self-pay | Admitting: *Deleted

## 2018-01-20 MED ORDER — LEVOTHYROXINE SODIUM 75 MCG PO TABS: 75.0000 ug | ORAL_TABLET | Freq: Every day | ORAL | 0 refills | Status: DC

## 2018-01-20 NOTE — Telephone Encounter (Signed)
Current protocol only covers antibiotic prophylaxis for dental procedures, not GI procedures.  "Gastrointestinal (GI) procedures are low risk for bacterial endocarditis and therefore antibiotic prophylaxis to prevent endocarditis is not recommended, even in patients with the highest-risk cardiac conditions (such as prosthetic valves or prior endocarditis)."  Discussed with Dr Tamala Julian, pt will require abx prior to dental work due to valve history, however will not require abx prior to upcoming colonoscopy.

## 2018-01-20 NOTE — Telephone Encounter (Signed)
   Call back staff: Marland Kitchen Clearance letter has been faxed to the requesting surgeon. . Patient may hold ASA 5-7 days for the procedure.  Please notify patient. . Please contact the surgeon's office to ensure the letter has been received. . This phone note will be removed from the preop pool. . Please sign encounter when completed.  Richardson Dopp, PA-C    01/20/2018 1:45 PM

## 2018-01-20 NOTE — Telephone Encounter (Signed)
Clearance letter has been received. I will remove from the Pre Op Call Back Pool. Pt has been notified she will need to s/w the surgeon's office to confirm how many days to hold the ASA, 5 days or 7 days. Pt thanked me for the call. I will remove from the Pre Op Call Back Pool.

## 2018-01-20 NOTE — Telephone Encounter (Signed)
   Primary Cardiologist: Sinclair Grooms, MD  Chart reviewed as part of pre-operative protocol coverage.   Please see discussion below.   Patient may proceed with planned procedure at acceptable risk.  Patient may hold ASA for 5-7 days prior to procedure and resume post op when safe.  Patient does not need antibiotic prophylaxis for colonoscopy.  Please call with questions.  Richardson Dopp, PA-C 01/20/2018, 1:39 PM

## 2018-02-01 ENCOUNTER — Telehealth: Payer: Self-pay | Admitting: Interventional Cardiology

## 2018-02-01 ENCOUNTER — Observation Stay (HOSPITAL_COMMUNITY)
Admission: EM | Admit: 2018-02-01 | Discharge: 2018-02-02 | Disposition: A | Discharge status: Home / Self Care | Payer: BLUE CROSS/BLUE SHIELD | Attending: Internal Medicine | Admitting: Internal Medicine

## 2018-02-01 ENCOUNTER — Emergency Department (HOSPITAL_COMMUNITY): Payer: BLUE CROSS/BLUE SHIELD

## 2018-02-01 ENCOUNTER — Observation Stay (HOSPITAL_COMMUNITY): Payer: BLUE CROSS/BLUE SHIELD

## 2018-02-01 ENCOUNTER — Encounter (HOSPITAL_COMMUNITY): Payer: Self-pay

## 2018-02-01 DIAGNOSIS — D696 Thrombocytopenia, unspecified: Secondary | ICD-10-CM

## 2018-02-01 DIAGNOSIS — E785 Hyperlipidemia, unspecified: Secondary | ICD-10-CM | POA: Diagnosis not present

## 2018-02-01 DIAGNOSIS — E039 Hypothyroidism, unspecified: Secondary | ICD-10-CM | POA: Diagnosis not present

## 2018-02-01 DIAGNOSIS — Z9081 Acquired absence of spleen: Secondary | ICD-10-CM | POA: Insufficient documentation

## 2018-02-01 DIAGNOSIS — N182 Chronic kidney disease, stage 2 (mild): Secondary | ICD-10-CM | POA: Insufficient documentation

## 2018-02-01 DIAGNOSIS — R079 Chest pain, unspecified: Secondary | ICD-10-CM | POA: Diagnosis present

## 2018-02-01 DIAGNOSIS — Z6836 Body mass index (BMI) 36.0-36.9, adult: Secondary | ICD-10-CM | POA: Diagnosis not present

## 2018-02-01 DIAGNOSIS — E782 Mixed hyperlipidemia: Secondary | ICD-10-CM | POA: Diagnosis present

## 2018-02-01 DIAGNOSIS — R0789 Other chest pain: Secondary | ICD-10-CM | POA: Diagnosis not present

## 2018-02-01 DIAGNOSIS — Z79899 Other long term (current) drug therapy: Secondary | ICD-10-CM | POA: Insufficient documentation

## 2018-02-01 DIAGNOSIS — N051 Unspecified nephritic syndrome with focal and segmental glomerular lesions: Secondary | ICD-10-CM | POA: Insufficient documentation

## 2018-02-01 DIAGNOSIS — Z79890 Hormone replacement therapy (postmenopausal): Secondary | ICD-10-CM | POA: Diagnosis not present

## 2018-02-01 DIAGNOSIS — Z862 Personal history of diseases of the blood and blood-forming organs and certain disorders involving the immune mechanism: Secondary | ICD-10-CM

## 2018-02-01 DIAGNOSIS — I2510 Atherosclerotic heart disease of native coronary artery without angina pectoris: Secondary | ICD-10-CM | POA: Diagnosis not present

## 2018-02-01 DIAGNOSIS — I10 Essential (primary) hypertension: Secondary | ICD-10-CM | POA: Insufficient documentation

## 2018-02-01 DIAGNOSIS — Z955 Presence of coronary angioplasty implant and graft: Secondary | ICD-10-CM | POA: Diagnosis not present

## 2018-02-01 DIAGNOSIS — K219 Gastro-esophageal reflux disease without esophagitis: Secondary | ICD-10-CM | POA: Diagnosis present

## 2018-02-01 DIAGNOSIS — I252 Old myocardial infarction: Secondary | ICD-10-CM | POA: Insufficient documentation

## 2018-02-01 DIAGNOSIS — I77810 Thoracic aortic ectasia: Secondary | ICD-10-CM | POA: Insufficient documentation

## 2018-02-01 DIAGNOSIS — I739 Peripheral vascular disease, unspecified: Secondary | ICD-10-CM | POA: Insufficient documentation

## 2018-02-01 DIAGNOSIS — I25709 Atherosclerosis of coronary artery bypass graft(s), unspecified, with unspecified angina pectoris: Secondary | ICD-10-CM | POA: Insufficient documentation

## 2018-02-01 DIAGNOSIS — Z87891 Personal history of nicotine dependence: Secondary | ICD-10-CM | POA: Diagnosis not present

## 2018-02-01 DIAGNOSIS — D693 Immune thrombocytopenic purpura: Secondary | ICD-10-CM | POA: Diagnosis not present

## 2018-02-01 DIAGNOSIS — Z7982 Long term (current) use of aspirin: Secondary | ICD-10-CM | POA: Diagnosis not present

## 2018-02-01 DIAGNOSIS — M3214 Glomerular disease in systemic lupus erythematosus: Secondary | ICD-10-CM | POA: Diagnosis present

## 2018-02-01 HISTORY — DX: Nonrheumatic mitral (valve) insufficiency: I34.0

## 2018-02-01 HISTORY — DX: Atherosclerotic heart disease of native coronary artery without angina pectoris: I25.10

## 2018-02-01 HISTORY — DX: Gastro-esophageal reflux disease without esophagitis: K21.9

## 2018-02-01 HISTORY — DX: Unspecified atrial fibrillation: I48.91

## 2018-02-01 HISTORY — DX: Nausea with vomiting, unspecified: R11.2

## 2018-02-01 HISTORY — DX: Other specified postprocedural states: Z98.890

## 2018-02-01 HISTORY — DX: Unspecified atrial fibrillation: I97.89

## 2018-02-01 LAB — COMPREHENSIVE METABOLIC PANEL
ALT: 15 U/L (ref 0–44)
AST: 27 U/L (ref 15–41)
Albumin: 3.4 g/dL — ABNORMAL LOW (ref 3.5–5.0)
Alkaline Phosphatase: 79 U/L (ref 38–126)
Anion gap: 5 (ref 5–15)
BUN: 12 mg/dL (ref 8–23)
CO2: 25 mmol/L (ref 22–32)
Calcium: 8.9 mg/dL (ref 8.9–10.3)
Chloride: 107 mmol/L (ref 98–111)
Creatinine, Ser: 1.09 mg/dL — ABNORMAL HIGH (ref 0.44–1.00)
GFR calc Af Amer: >60 mL/min (ref >60)
GFR calc non Af Amer: 53 mL/min — ABNORMAL LOW (ref >60)
Glucose, Bld: 85 mg/dL (ref 70–99)
Potassium: 4.1 mmol/L (ref 3.5–5.1)
Sodium: 137 mmol/L (ref 135–145)
Total Bilirubin: 0.5 mg/dL (ref 0.3–1.2)
Total Protein: 6.8 g/dL (ref 6.5–8.1)

## 2018-02-01 LAB — CBC
HCT: 39.5 % (ref 36.0–46.0)
Hemoglobin: 13.2 g/dL (ref 12.0–15.0)
MCH: 33.2 pg (ref 26.0–34.0)
MCHC: 33.4 g/dL (ref 30.0–36.0)
MCV: 99.2 fL (ref 80.0–100.0)
Platelets: 34 10*3/uL — ABNORMAL LOW (ref 150–400)
RBC: 3.98 MIL/uL (ref 3.87–5.11)
RDW: 15.6 % — ABNORMAL HIGH (ref 11.5–15.5)
WBC: 2.2 10*3/uL — ABNORMAL LOW (ref 4.0–10.5)
nRBC: 0 % (ref 0.0–0.2)

## 2018-02-01 LAB — TROPONIN I: Troponin I: <0.03 ng/mL (ref <0.03)

## 2018-02-01 LAB — I-STAT TROPONIN, ED: Troponin i, poc: 0.01 ng/mL (ref 0.00–0.08)

## 2018-02-01 LAB — D-DIMER, QUANTITATIVE (NOT AT ARMC): D-Dimer, Quant: 0.68 ug/mL-FEU — ABNORMAL HIGH (ref 0.00–0.50)

## 2018-02-01 MED ORDER — IOPAMIDOL (ISOVUE-370) INJECTION 76%
100.0000 mL | Freq: Once | INTRAVENOUS | Status: AC | PRN
  Administered 2018-02-01: 100 mL via INTRAVENOUS

## 2018-02-01 MED ORDER — HYDROXYCHLOROQUINE SULFATE 200 MG PO TABS
200.0000 mg | ORAL_TABLET | Freq: Every day | ORAL | Status: DC
  Administered 2018-02-02 (×2): 200 mg via ORAL
  Filled 2018-02-01 (×2): qty 1

## 2018-02-01 MED ORDER — METOPROLOL SUCCINATE ER 50 MG PO TB24
50.0000 mg | ORAL_TABLET | Freq: Every day | ORAL | Status: DC
  Filled 2018-02-01: qty 1

## 2018-02-01 MED ORDER — ROSUVASTATIN CALCIUM 20 MG PO TABS
20.0000 mg | ORAL_TABLET | Freq: Every day | ORAL | Status: DC
  Administered 2018-02-02: 20 mg via ORAL
  Filled 2018-02-01 (×2): qty 1

## 2018-02-01 MED ORDER — NITROGLYCERIN 0.4 MG SL SUBL: 0.4000 mg | SUBLINGUAL_TABLET | SUBLINGUAL | Status: DC | PRN

## 2018-02-01 MED ORDER — ONDANSETRON HCL 4 MG/2ML IJ SOLN
4.0000 mg | Freq: Once | INTRAMUSCULAR | Status: AC
  Administered 2018-02-01: 4 mg via INTRAVENOUS
  Filled 2018-02-01: qty 2

## 2018-02-01 MED ORDER — LEVOTHYROXINE SODIUM 75 MCG PO TABS
75.0000 ug | ORAL_TABLET | Freq: Every day | ORAL | Status: DC
  Administered 2018-02-02: 75 ug via ORAL
  Filled 2018-02-01: qty 1

## 2018-02-01 MED ORDER — PROCHLORPERAZINE EDISYLATE 10 MG/2ML IJ SOLN
10.0000 mg | Freq: Once | INTRAMUSCULAR | Status: AC
  Administered 2018-02-02: 10 mg via INTRAVENOUS
  Filled 2018-02-01: qty 2

## 2018-02-01 MED ORDER — MORPHINE SULFATE (PF) 2 MG/ML IV SOLN: 2.0000 mg | INTRAVENOUS | Status: DC | PRN

## 2018-02-01 MED ORDER — PANTOPRAZOLE SODIUM 40 MG PO TBEC
40.0000 mg | DELAYED_RELEASE_TABLET | Freq: Every day | ORAL | Status: DC
  Administered 2018-02-02: 40 mg via ORAL
  Filled 2018-02-01 (×2): qty 1

## 2018-02-01 MED ORDER — MORPHINE SULFATE (PF) 4 MG/ML IV SOLN
4.0000 mg | Freq: Once | INTRAVENOUS | Status: AC
  Administered 2018-02-01: 4 mg via INTRAVENOUS
  Filled 2018-02-01: qty 1

## 2018-02-01 MED ORDER — SODIUM CHLORIDE 0.9 % IV SOLN
INTRAVENOUS | Status: DC
  Administered 2018-02-02: via INTRAVENOUS

## 2018-02-01 MED ORDER — AZATHIOPRINE 50 MG PO TABS
50.0000 mg | ORAL_TABLET | Freq: Two times a day (BID) | ORAL | Status: DC
  Administered 2018-02-02 (×2): 50 mg via ORAL
  Filled 2018-02-01 (×2): qty 1

## 2018-02-01 MED ORDER — IOPAMIDOL (ISOVUE-370) INJECTION 76%: 100.0000 mL | Freq: Once | INTRAVENOUS | Status: DC | PRN

## 2018-02-01 MED ORDER — ONDANSETRON HCL 4 MG/2ML IJ SOLN: 4.0000 mg | Freq: Four times a day (QID) | INTRAMUSCULAR | Status: DC | PRN

## 2018-02-01 MED ORDER — IOPAMIDOL (ISOVUE-370) INJECTION 76%
INTRAVENOUS | Status: AC
  Filled 2018-02-01: qty 100

## 2018-02-01 MED ORDER — ASPIRIN 81 MG PO CHEW: 81.0000 mg | CHEWABLE_TABLET | Freq: Every day | ORAL | Status: DC

## 2018-02-01 MED ORDER — ACETAMINOPHEN 325 MG PO TABS: 650.0000 mg | ORAL_TABLET | ORAL | Status: DC | PRN

## 2018-02-01 MED ORDER — ASPIRIN 81 MG PO CHEW
324.0000 mg | CHEWABLE_TABLET | Freq: Once | ORAL | Status: AC
  Administered 2018-02-01: 324 mg via ORAL
  Filled 2018-02-01: qty 4

## 2018-02-01 NOTE — ED Notes (Signed)
ED TO INPATIENT HANDOFF REPORT  Name/Age/Gender [name] 62 y.o. female  Code Status Code Status History    Date Active Date Inactive Code Status Order ID Comments User Context   01/23/2017 1606 02/11/2017 1615 Full Code [id number]  Belva Crome, MD Inpatient   11/18/2015 2235 11/19/2015 1722 Full Code [id number]  Etta Quill, DO ED   03/07/2012 0534 03/09/2012 1244 Full Code [id number]  Etta Quill., DO ED      Home/SNF/Other Home  Chief Complaint Chest Pain   Level of Care/Admitting Diagnosis ED Disposition    ED Disposition Condition [facility] Area: [place] [100100]  Level of Care: Telemetry [5]  I expect the patient will be discharged within 24 hours: Yes  LOW acuity---Tx typically complete <24 hrs---ACUTE conditions typically can be evaluated <24 hours---LABS likely to return to acceptable levels <24 hours---IS near functional baseline---EXPECTED to return to current living arrangement---NOT newly hypoxic: Meets criteria for 5C-Observation unit  Diagnosis: Chest pain [174081]  Admitting Physician: Shelbie Proctor [[id number]]  Attending Physician: Shelbie Proctor [[id number]]  PT Class (Do Not Modify): Observation [104]  PT Acc Code (Do Not Modify): Observation [10022]       Medical History Past Medical History:  Diagnosis Date  . Chronic ischemic heart disease, unspecified    w/ stents in mid circumflex and mid RCA in 03, and balloon angioplasty of Diagonal branch 10-03, last cath Sept 08- 2 vessel disease w/ patent stents  . Esophageal reflux   . Hypothyroidism   . Immune thrombocytopenic purpura ([place])   . Lupus erythematosus   . Mitral valve insufficiency and aortic valve insufficiency   . Myocardial infarction ([place])    x 2  . Peripheral vascular disease, unspecified ([place])   . Unspecified disease of pericardium     Allergies No Known Allergies  IV Location/Drains/Wounds Patient  Lines/Drains/Airways Status   Active Line/Drains/Airways    Name:   Placement date:   Placement time:   Site:   Days:   Peripheral IV 02/01/18 Right Antecubital   02/01/18    1804    Antecubital   less than 1          Labs/Imaging Results for orders placed or performed during the hospital encounter of 02/01/18 (from the past 48 hour(s))  CBC     Status: Abnormal   Collection Time: 02/01/18  6:10 PM  Result Value Ref Range   WBC 2.2 (L) 4.0 - 10.5 K/uL   RBC 3.98 3.87 - 5.11 MIL/uL   Hemoglobin 13.2 12.0 - 15.0 g/dL   HCT 39.5 36.0 - 46.0 %   MCV 99.2 80.0 - 100.0 fL   MCH 33.2 26.0 - 34.0 pg   MCHC 33.4 30.0 - 36.0 g/dL   RDW 15.6 (H) 11.5 - 15.5 %   Platelets 34 (L) 150 - 400 K/uL    Comment: REPEATED TO VERIFY PLATELET COUNT CONFIRMED BY SMEAR Immature Platelet Fraction may be clinically indicated, consider ordering this additional test [id number]    nRBC 0.0 0.0 - 0.2 %    Comment: Performed at [facility], [place] [address], [place], [place] [address]  Comprehensive metabolic panel     Status: Abnormal   Collection Time: 02/01/18  6:10 PM  Result Value Ref Range   Sodium 137 135 - 145 mmol/L   Potassium 4.1 3.5 - 5.1 mmol/L   Chloride 107 98 - 111 mmol/L   CO2 25 22 - 32  mmol/L   Glucose, Bld 85 70 - 99 mg/dL   BUN 12 8 - 23 mg/dL   Creatinine, Ser 1.09 (H) 0.44 - 1.00 mg/dL   Calcium 8.9 8.9 - 10.3 mg/dL   Total Protein 6.8 6.5 - 8.1 g/dL   Albumin 3.4 (L) 3.5 - 5.0 g/dL   AST 27 15 - 41 U/L   ALT 15 0 - 44 U/L   Alkaline Phosphatase 79 38 - 126 U/L   Total Bilirubin 0.5 0.3 - 1.2 mg/dL   GFR calc non Af Amer 53 (L) >60 mL/min   GFR calc Af Amer >60 >60 mL/min    Comment: (NOTE) The eGFR has been calculated using the CKD EPI equation. This calculation has not been validated in all clinical situations. eGFR's persistently <60 mL/min signify possible Chronic Kidney Disease.    Anion gap 5 5 - 15    Comment: Performed at [facility]  [address] [place], [place] [address]  I-stat troponin, ED     Status: None   Collection Time: 02/01/18  6:16 PM  Result Value Ref Range   Troponin i, poc 0.01 0.00 - 0.08 ng/mL   Comment 3            Comment: Due to the release kinetics of cTnI, a negative result within the first hours of the onset of symptoms does not rule out myocardial infarction with certainty. If myocardial infarction is still suspected, repeat the test at appropriate intervals.    Dg Chest Port 1 View  Result Date: 02/01/2018 CLINICAL DATA:  Chest pain EXAM: PORTABLE CHEST 1 VIEW COMPARISON:  07/01/2017, 03/23/2017 FINDINGS: Post sternotomy changes. Cardiomegaly with mild central congestion. No focal airspace disease or effusion. No pneumothorax. IMPRESSION: No active disease. Electronically Signed   By: Donavan Foil M.D.   On: 02/01/2018 18:29    Pending Labs Unresulted Labs (From admission, onward)    Start     Ordered   02/01/18 2100  Troponin I - Once-Timed  Once-Timed,   R     02/01/18 2022   02/01/18 2023  D-dimer, quantitative (not at [facility])  Once,   R     02/01/18 2022          Vitals/Pain Today's Vitals   02/01/18 1841 02/01/18 1843 02/01/18 1900 02/01/18 1934  BP:   134/85 139/63  Pulse:   (!) 57 (!) 55  Resp:   17 13  Temp:      TempSrc:      SpO2:   100% 100%  Weight:  106.6 kg    Height:  '5\' 7"'  (1.702 m)    PainSc: 4        Isolation Precautions No active isolations  Medications Medications  aspirin chewable tablet 324 mg (324 mg Oral Given 02/01/18 1902)  morphine 4 MG/ML injection 4 mg (4 mg Intravenous Given 02/01/18 1903)  ondansetron (ZOFRAN) injection 4 mg (4 mg Intravenous Given 02/01/18 1922)    Mobility walks

## 2018-02-01 NOTE — Progress Notes (Signed)
20 G IV in LAC would not flush and begin to swell/extravasate when pushed with saline. New 20g was placed above other in L AC by myself which worked well and allowed exam to be completed. Nurse informed of new placement and that other IV should be removed when patient returns to floor.

## 2018-02-01 NOTE — ED Notes (Signed)
Pt states she is not supposed to take nitro. Will inform provider.

## 2018-02-01 NOTE — Telephone Encounter (Signed)
Follow Up:    Pt says terrible headache, it is is getting worse, her head is throbbing, back pain and a little chest pain off and on. No shortness of breath and no chest pain at this time.

## 2018-02-01 NOTE — Telephone Encounter (Signed)
Called patient back about her message. Informed patient if her symptoms have gotten worse and she is having chest pain that she needs to go to the ED. Patient stated she has more like back pain radiating to shoulders, than chest pain. Asked patient  if she has talked to her PCP about her back pain. Patient stated her symptoms are similar to her symptoms when she had her heart attacks and CABG. Informed patient with her history and her similar symptoms she needs to go to the ED. Patient verbalized understanding and will go to the ED.

## 2018-02-01 NOTE — ED Triage Notes (Addendum)
Patient complains of 1 week of intermittent chest and back pain with radiation down right arm. States that she has noted her BP 160s/100s and taking meds as prescribed. No cough and no cold symptoms. Took 81mg  x 2 pta. Describes the arm pain as a tingling

## 2018-02-01 NOTE — ED Provider Notes (Signed)
[facility] EMERGENCY DEPARTMENT Provider Note   CSN: [id number] Arrival date & time: 02/01/18  1749     History   Chief Complaint Chief Complaint  Patient presents with  . Chest Pain    HPI [name] is a 62 y.o. female.  HPI Patient is a 62 year old female with a past medical history of four-vessel CABG, previous MI, hypothyroidism, and lupus who presents to the emergency department for evaluation of bilateral arm pain and chest pain.  Reports that for the past week she has noticed significant pain/tingling in her bilateral shoulders as well as some accompanying chest pain.  States that her pain comes and goes and does not seem to be related to exertion.  Pain is somewhat positional as it is relieved by leaning forward, however she states that that does not seem to have much effect on her chest pain.  Denies any pleuritic component to her chest pain.  She denies any associated nausea, vomiting, or diaphoresis.  States that since having her CABG approximately 1 year ago she has not had further cardiac testing.  She also reports that her blood pressures have been somewhat elevated over the past week with her highest blood pressure being greater than 160.  States that she normally runs in the 120-130 range.  She denies any cough, chills, or lightheadedness.  Remaining review of systems is as below.   Past Medical History:  Diagnosis Date  . Chronic ischemic heart disease, unspecified    w/ stents in mid circumflex and mid RCA in 03, and balloon angioplasty of Diagonal branch 10-03, last cath Sept 08- 2 vessel disease w/ patent stents  . Esophageal reflux   . Hypothyroidism   . Immune thrombocytopenic purpura ([place])   . Lupus erythematosus   . Mitral valve insufficiency and aortic valve insufficiency   . Myocardial infarction ([place])    x 2  . Peripheral vascular disease, unspecified ([place])   . PONV (postoperative nausea and vomiting)   . Unspecified disease  of pericardium     Patient Active Problem List   Diagnosis Date Noted  . Chest pain 02/01/2018  . Cervical cancer screening 05/27/2017  . Visit for screening mammogram 05/27/2017  . Deficiency anemia 02/24/2017  . Routine general medical examination at a health care facility 02/18/2017  . Postoperative atrial fibrillation ([place]) 02/18/2017  . S/P CABG x 4 01/30/2017  . S/P MVR (mitral valve repair) 01/30/2017  . Osteoarthritis of spine with radiculopathy, lumbar region 06/24/2016  . Hypothyroidism 04/08/2011  . Coronary artery disease involving coronary bypass graft of native heart with angina pectoris ([place]) 02/26/2009  . Hyperlipidemia 01/26/2009  . Immune thrombocytopenic purpura ([place]) 08/05/2007  . DVT 08/05/2007  . GERD 08/05/2007  . Systemic lupus erythematosus with focal and segmental proliferative glomerulonephritis ([place]) 08/05/2007    Past Surgical History:  Procedure Laterality Date  . CHOLECYSTECTOMY, LAPAROSCOPIC  2010  . CORONARY ARTERY BYPASS GRAFT N/A 01/30/2017   Procedure: CORONARY ARTERY BYPASS GRAFTING (CABG), Times four  using the right saphaneous vein, harvested endoscopicly.  and left internal mammary artery .;  Surgeon: Gaye Pollack, MD;  Location: [facility] OR;  Service: Open Heart Surgery;  Laterality: N/A;  . LEFT HEART CATH AND CORONARY ANGIOGRAPHY N/A 01/23/2017   Procedure: LEFT HEART CATH AND CORONARY ANGIOGRAPHY;  Surgeon: Belva Crome, MD;  Location: [facility];  Service: Cardiovascular;  Laterality: N/A;  . MITRAL VALVE REPAIR N/A 01/30/2017   Procedure: MITRAL VALVE REPAIR (MVR);  Surgeon: Gaye Pollack, MD;  Location: [facility];  Service: Open Heart Surgery;  Laterality: N/A;  . plastic surgical repair      dog bite to leg  . SPLENECTOMY  5-04  . TEE WITHOUT CARDIOVERSION N/A 01/27/2017   Procedure: TRANSESOPHAGEAL ECHOCARDIOGRAM (TEE);  Surgeon: Sanda Klein, MD;  Location: [facility] ENDOSCOPY;  Service: Cardiovascular;  Laterality: N/A;  . TEE  WITHOUT CARDIOVERSION N/A 01/30/2017   Procedure: TRANSESOPHAGEAL ECHOCARDIOGRAM (TEE);  Surgeon: Gaye Pollack, MD;  Location: [facility];  Service: Open Heart Surgery;  Laterality: N/A;  . ULTRASOUND GUIDANCE FOR VASCULAR ACCESS  01/23/2017   Procedure: Ultrasound Guidance For Vascular Access;  Surgeon: Belva Crome, MD;  Location: [facility];  Service: Cardiovascular;;     OB History    Gravida  2   Para  2   Term      Preterm      AB      Living  2     SAB      TAB      Ectopic      Multiple      Live Births               Home Medications    Prior to Admission medications   Medication Sig Start Date End Date Taking? Authorizing Provider  aspirin 81 MG chewable tablet Chew 81 mg by mouth daily.    Yes [provider]  azaTHIOprine (IMURAN) 50 MG tablet Take 50 mg by mouth 2 (two) times daily.   Yes [provider]  hydroxychloroquine (PLAQUENIL) 200 MG tablet Take 200 mg by mouth daily.    Yes [provider]  levothyroxine (SYNTHROID, LEVOTHROID) 75 MCG tablet Take 1 tablet (75 mcg total) by mouth daily. 01/20/18  Yes Janith Lima, MD  metoprolol succinate (TOPROL-XL) 50 MG 24 hr tablet Take 1 tablet (50 mg total) by mouth daily. Take with or immediately following a meal. 12/02/17 03/02/18 Yes Belva Crome, MD  pantoprazole (PROTONIX) 40 MG tablet TAKE 1 TABLET BY MOUTH EVERY DAY Patient taking differently: Take 40 mg by mouth daily.  08/12/17  Yes Belva Crome, MD  rosuvastatin (CRESTOR) 20 MG tablet TAKE 1 TABLET BY MOUTH EVERY DAY Patient taking differently: Take 20 mg by mouth daily.  12/01/17  Yes Burnell Blanks, MD  HYDROcodone-acetaminophen (NORCO) 5-325 MG tablet Take 1 tablet by mouth every 6 (six) hours as needed for moderate pain. Patient not taking: Reported on 02/01/2018 07/03/17   Marrian Salvage, FNP    Family History Family History  Problem Relation Age of Onset  . Cancer Mother         breast and cervical  . Paget's disease of bone Mother   . Fibromyalgia Mother   . Hypertension Father   . Heart disease Father        PVD  . GER disease Father   . Cancer Paternal Grandfather        colon  . Heart attack Maternal Grandmother     Social History Social History   Tobacco Use  . Smoking status: Former Smoker    Packs/day: 0.50    Years: 30.00    Pack years: 15.00    Types: Cigarettes    Last attempt to quit: 12/23/2011    Years since quitting: 6.1  . Smokeless tobacco: Never Used  Substance Use Topics  . Alcohol use: No  . Drug use: No  Allergies   Patient has no known allergies.   Review of Systems Review of Systems  Constitutional: Negative for chills and fever.  HENT: Negative for ear pain and sore throat.   Eyes: Negative for pain and visual disturbance.  Respiratory: Negative for cough and shortness of breath.   Cardiovascular: Positive for chest pain. Negative for palpitations.  Gastrointestinal: Negative for abdominal pain and vomiting.  Genitourinary: Negative for dysuria and hematuria.  Musculoskeletal: Positive for arthralgias (Bilateral shoulder pains). Negative for back pain.  Skin: Negative for color change and rash.  Neurological: Negative for seizures and syncope.  All other systems reviewed and are negative.    Physical Exam Updated Vital Signs BP (!) 149/94 (BP Location: Left Arm)   Pulse (!) 58   Temp 98.2 F (36.8 C) (Oral)   Resp 15   Ht 5\' 7"  (1.702 m)   Wt 106.6 kg   SpO2 100%   BMI 36.81 kg/m   Physical Exam  Constitutional: She appears well-developed and well-nourished. No distress.  HENT:  Head: Normocephalic and atraumatic.  Eyes: Conjunctivae are normal.  Neck: Neck supple.  Cardiovascular: Normal rate, regular rhythm and normal pulses.  Pulmonary/Chest: Effort normal and breath sounds normal. No respiratory distress. She has no decreased breath sounds. She has no wheezes.  Well healed scar from CABG  present.   Abdominal: Soft. There is no tenderness.  Musculoskeletal: She exhibits no edema.  Neurological: She is alert.  Skin: Skin is warm and dry.  Psychiatric: She has a normal mood and affect.  Nursing note and vitals reviewed.    ED Treatments / Results  Labs (all labs ordered are listed, but only abnormal results are displayed) Labs Reviewed  CBC - Abnormal; Notable for the following components:      Result Value   WBC 2.2 (*)    RDW 15.6 (*)    Platelets 34 (*)    All other components within normal limits  COMPREHENSIVE METABOLIC PANEL - Abnormal; Notable for the following components:   Creatinine, Ser 1.09 (*)    Albumin 3.4 (*)    GFR calc non Af Amer 53 (*)    All other components within normal limits  D-DIMER, QUANTITATIVE (NOT AT [facility]) - Abnormal; Notable for the following components:   D-Dimer, Quant 0.68 (*)    All other components within normal limits  TROPONIN I  I-STAT TROPONIN, ED    EKG EKG Interpretation  Date/Time:  Monday February 01 2018 17:58:06 EST Ventricular Rate:  64 PR Interval:    QRS Duration: 101 QT Interval:  462 QTC Calculation: 477 R Axis:   46 Text Interpretation:  Sinus rhythm Low voltage, precordial leads No significant change since last tracing Confirmed by Wandra Arthurs ([id number]) on 02/01/2018 6:51:14 PM   Radiology Dg Chest Port 1 View  Result Date: 02/01/2018 CLINICAL DATA:  Chest pain EXAM: PORTABLE CHEST 1 VIEW COMPARISON:  07/01/2017, 03/23/2017 FINDINGS: Post sternotomy changes. Cardiomegaly with mild central congestion. No focal airspace disease or effusion. No pneumothorax. IMPRESSION: No active disease. Electronically Signed   By: Donavan Foil M.D.   On: 02/01/2018 18:29    Procedures Procedures (including critical care time)  Medications Ordered in ED Medications  aspirin chewable tablet 81 mg (has no administration in time range)  azaTHIOprine (IMURAN) tablet 50 mg (has no administration in time range)    hydroxychloroquine (PLAQUENIL) tablet 200 mg (has no administration in time range)  pantoprazole (PROTONIX) EC tablet 40 mg (has no administration  in time range)  rosuvastatin (CRESTOR) tablet 20 mg (has no administration in time range)  metoprolol succinate (TOPROL-XL) 24 hr tablet 50 mg (has no administration in time range)  levothyroxine (SYNTHROID, LEVOTHROID) tablet 75 mcg (has no administration in time range)  acetaminophen (TYLENOL) tablet 650 mg (has no administration in time range)  ondansetron (ZOFRAN) injection 4 mg (has no administration in time range)  morphine 2 MG/ML injection 2 mg (has no administration in time range)  nitroGLYCERIN (NITROSTAT) SL tablet 0.4 mg (has no administration in time range)  0.9 %  sodium chloride infusion (has no administration in time range)  prochlorperazine (COMPAZINE) injection 10 mg (has no administration in time range)  iopamidol (ISOVUE-370) 76 % injection (has no administration in time range)  iopamidol (ISOVUE-370) 76 % injection 100 mL (has no administration in time range)  aspirin chewable tablet 324 mg (324 mg Oral Given 02/01/18 1902)  morphine 4 MG/ML injection 4 mg (4 mg Intravenous Given 02/01/18 1903)  ondansetron (ZOFRAN) injection 4 mg (4 mg Intravenous Given 02/01/18 1922)     Initial Impression / Assessment and Plan / ED Course  I have reviewed the triage vital signs and the nursing notes.  Pertinent labs & imaging results that were available during my care of the patient were reviewed by me and considered in my medical decision making (see chart for details).     Patient is a 62 year old female with past medical history as detailed above who presents emerged department for evaluation of chest pain.  Based off the patient's past medical history and her presenting symptoms laboratory and imaging studies were obtained.  Patient's labs are overall reassuring.  Negative troponin on the emergency department.  Her EKG does not show  acute ischemic changes.  Chest x-ray overall unremarkable.  Given her recurrent chest pain in the emergency department she was given a dose of aspirin and IV morphine with improvement to her symptoms.  Given the patient's history of CABG within the last year and no provocative testing since that time I believe patient is appropriate for admission for high risk chest pain.  At this time an exact etiology of her chest pain is unclear.  Patient does evidence of pneumonia or pneumothorax on chest x-ray.  Patient history and consistent with pulmonary embolism and she is not tachycardic or hypoxic.  Patient with no recent trauma to her chest.  For further information regarding patient's continued hospital course please see admitting team's documentation.  The care of this patient was discussed with my attending physician Dr. Darl Householder, who voices agreement with work-up and ED disposition.   Final Clinical Impressions(s) / ED Diagnoses   Final diagnoses:  Nonspecific chest pain    ED Discharge Orders    None       Doria Fern, Chanda Busing, MD 02/02/18 1439    Drenda Freeze, MD 02/02/18 [id number]

## 2018-02-01 NOTE — Telephone Encounter (Signed)
New message    Pt c/o BP issue: STAT if pt c/o blurred vision, one-sided weakness or slurred speech  1. What are your last 5 BP readings? 160/100 163/103 160/100 157/103 161/101  2. Are you having any other symptoms (ex. Dizziness, headache, blurred vision, passed out)? Extreme headaches   3. What is your BP issue? Bp is elevated

## 2018-02-01 NOTE — Telephone Encounter (Signed)
Per pt these b/p readings are over a 5 day period also complaining with headaches as well.Pt not taking anything OTC or increase salt intake . Will forward to Dr Tamala Julian for review and recommendations ./cy

## 2018-02-01 NOTE — H&P (Signed)
History and Physical    [name] IOE:[id number] DOB: 1956/02/13 DOA: 02/01/2018  PCP: Janith Lima, MD Patient coming from: Home  I have personally briefly reviewed patient's old medical records in [place]  Chief Complaint: CP  HPI: [name] is a 62 y.o. female with medical history significant of coronary artery disease status post CABG, lupus, chronic kidney disease, ITP status post splenectomy presents with atypical chest pain.  Patient had chest pain on and off for the past week or so.  Pain was located substernally.  She states it feels like a tingling numbing sensation.  At times radiates to her right arm.  Nothing seems to make it better or worse.  She did try Tylenol and aspirin at home.  She does have have some occasional nerve discomfort in her chest and her leg due to her bypass surgery but this is different.  Patient thus far has a negative chest x-ray nonacute EKG and a negative troponin.  Patient does have a history of lupus currently on medical management per her rheumatologist.  Denies any history of DVT PE.  Denies any recent change in her medications.  Follows with Dr. Tamala Julian cardiology.  She NOTED her blood pressure has been elevated in 150 range over the past 3 weeks.  Her blood pressure med was changed by her cardiologist back in September.   ED Course: Patient received aspirin, morphine, and Zofran in the ED with relief of her recurrent chest pain.  Review of Systems: Denies shortness of breath cough fever GI complaints positive for chest pain  Past Medical History:  Diagnosis Date  . Chronic ischemic heart disease, unspecified    w/ stents in mid circumflex and mid RCA in 03, and balloon angioplasty of Diagonal branch 10-03, last cath Sept 08- 2 vessel disease w/ patent stents  . Esophageal reflux   . Hypothyroidism   . Immune thrombocytopenic purpura ([place])   . Lupus erythematosus   . Mitral valve insufficiency and aortic valve  insufficiency   . Myocardial infarction ([place])    x 2  . Peripheral vascular disease, unspecified ([place])   . PONV (postoperative nausea and vomiting)   . Unspecified disease of pericardium     Past Surgical History:  Procedure Laterality Date  . CHOLECYSTECTOMY, LAPAROSCOPIC  2010  . CORONARY ARTERY BYPASS GRAFT N/A 01/30/2017   Procedure: CORONARY ARTERY BYPASS GRAFTING (CABG), Times four  using the right saphaneous vein, harvested endoscopicly.  and left internal mammary artery .;  Surgeon: Gaye Pollack, MD;  Location: [facility] OR;  Service: Open Heart Surgery;  Laterality: N/A;  . LEFT HEART CATH AND CORONARY ANGIOGRAPHY N/A 01/23/2017   Procedure: LEFT HEART CATH AND CORONARY ANGIOGRAPHY;  Surgeon: Belva Crome, MD;  Location: [facility];  Service: Cardiovascular;  Laterality: N/A;  . MITRAL VALVE REPAIR N/A 01/30/2017   Procedure: MITRAL VALVE REPAIR (MVR);  Surgeon: Gaye Pollack, MD;  Location: [facility];  Service: Open Heart Surgery;  Laterality: N/A;  . plastic surgical repair      dog bite to leg  . SPLENECTOMY  5-04  . TEE WITHOUT CARDIOVERSION N/A 01/27/2017   Procedure: TRANSESOPHAGEAL ECHOCARDIOGRAM (TEE);  Surgeon: Sanda Klein, MD;  Location[facility] END[facility];  Service: Cardiovascular;  Laterality: N/A;  . TEE WITHOUT CARDIOVERSION N/A 01/30/2017   Procedure: TRANSESOPHAGEAL ECHOCARDIOGRAM (TEE);  Surgeon: Gaye Pollack, MD;  Location: [facility];  Service: Open Heart Surgery;  Laterality: N/A;  . ULTRASOUND GUIDANCE FOR  VASCULAR ACCESS  01/23/2017   Procedure: Ultrasound Guidance For Vascular Access;  Surgeon: Belva Crome, MD;  Location: [facility];  Service: Cardiovascular;;     reports that she quit smoking about 1.5 years ago. Her smoking use included cigarettes. She has a 15.00 pack-year smoking history. She has never used smokeless tobacco. She reports that she does not drink alcohol or use drugs.  No Known Allergies  Family History  Problem Relation Age of  Onset  . Cancer Mother        breast and cervical  . Paget's disease of bone Mother   . Fibromyalgia Mother   . Hypertension Father   . Heart disease Father        PVD  . GER disease Father   . Cancer Paternal Grandfather        colon  . Heart attack Maternal Grandmother     Prior to Admission medications   Medication Sig Start Date End Date Taking? Authorizing Provider  aspirin 81 MG chewable tablet Chew 81 mg by mouth daily.    Yes [provider]  azaTHIOprine (IMURAN) 50 MG tablet Take 50 mg by mouth 2 (two) times daily.   Yes [provider]  hydroxychloroquine (PLAQUENIL) 200 MG tablet Take 200 mg by mouth daily.    Yes [provider]  levothyroxine (SYNTHROID, LEVOTHROID) 75 MCG tablet Take 1 tablet (75 mcg total) by mouth daily. 01/20/18  Yes Janith Lima, MD  metoprolol succinate (TOPROL-XL) 50 MG 24 hr tablet Take 1 tablet (50 mg total) by mouth daily. Take with or immediately following a meal. 12/02/17 03/02/18 Yes Belva Crome, MD  pantoprazole (PROTONIX) 40 MG tablet TAKE 1 TABLET BY MOUTH EVERY DAY Patient taking differently: Take 40 mg by mouth daily.  08/12/17  Yes Belva Crome, MD  rosuvastatin (CRESTOR) 20 MG tablet TAKE 1 TABLET BY MOUTH EVERY DAY Patient taking differently: Take 20 mg by mouth daily.  12/01/17  Yes Burnell Blanks, MD           Physical Exam: Vitals:   02/01/18 1934 02/01/18 2015 02/01/18 2045 02/01/18 2116  BP: 139/63 132/86 (!) 154/85 (!) 149/94  Pulse: (!) 55 (!) 57 (!) 58 (!) 58  Resp: 13 (!) 33 16 15  Temp:    98.2 F (36.8 C)  TempSrc:    Oral  SpO2: 100% 100% 100% 100%  Weight:      Height:        Constitutional: NAD, calm, comfortable Vitals:   02/01/18 1934 02/01/18 2015 02/01/18 2045 02/01/18 2116  BP: 139/63 132/86 (!) 154/85 (!) 149/94  Pulse: (!) 55 (!) 57 (!) 58 (!) 58  Resp: 13 (!) 33 16 15  Temp:    98.2 F (36.8 C)  TempSrc:    Oral  SpO2: 100% 100% 100% 100%  Weight:        Height:       Respiratory: clear to auscultation bilaterally, no wheezing, no crackles. Normal respiratory effort. No accessory muscle use.  Cardiovascular: Regular rate and rhythm, no murmurs / rubs / gallops. No extremity edema. 2+ pedal pulses. No carotid bruits.  Abdomen: no tenderness, no masses palpated. No hepatosplenomegaly. Bowel sounds positive.  Musculoskeletal: no clubbing / cyanosis. No joint deformity upper and lower extremities. Good ROM, no contractures. Normal muscle tone.  Psychiatric: Normal judgment and insight. Alert and oriented x 3. Normal mood.     Labs on Admission: I have personally reviewed  following labs and imaging studies  CBC: Recent Labs  Lab 02/01/18 1810  WBC 2.2*  HGB 13.2  HCT 39.5  MCV 99.2  PLT 34*   Basic Metabolic Panel: Recent Labs  Lab 02/01/18 1810  NA 137  K 4.1  CL 107  CO2 25  GLUCOSE 85  BUN 12  CREATININE 1.09*  CALCIUM 8.9   GFR: Estimated Creatinine Clearance: 67.2 mL/min (A) (by C-G formula based on SCr of 1.09 mg/dL (H)). Liver Function Tests: Recent Labs  Lab 02/01/18 1810  AST 27  ALT 15  ALKPHOS 79  BILITOT 0.5  PROT 6.8  ALBUMIN 3.4*   No results for input(s): LIPASE, AMYLASE in the last 168 hours. No results for input(s): AMMONIA in the last 168 hours. Coagulation Profile: No results for input(s): INR, PROTIME in the last 168 hours. Cardiac Enzymes: Recent Labs  Lab 02/01/18 2121  TROPONINI <0.03   BNP (last 3 results) No results for input(s): PROBNP in the last 8760 hours. HbA1C: No results for input(s): HGBA1C in the last 72 hours. CBG: No results for input(s): GLUCAP in the last 168 hours. Lipid Profile: No results for input(s): CHOL, HDL, LDLCALC, TRIG, CHOLHDL, LDLDIRECT in the last 72 hours. Thyroid Function Tests: No results for input(s): TSH, T4TOTAL, FREET4, T3FREE, THYROIDAB in the last 72 hours. Anemia Panel: No results for input(s): VITAMINB12, FOLATE, FERRITIN, TIBC, IRON,  RETICCTPCT in the last 72 hours. Urine analysis:    Component Value Date/Time   COLORURINE YELLOW 03/09/2016 0205   APPEARANCEUR HAZY (A) 03/09/2016 0205   LABSPEC 1.019 03/09/2016 0205   PHURINE 7.0 03/09/2016 0205   GLUCOSEU NEGATIVE 03/09/2016 0205   GLUCOSEU NEGATIVE 03/04/2012 1214   HGBUR LARGE (A) 03/09/2016 0205   BILIRUBINUR negtaive 03/14/2016 1456   KETONESUR NEGATIVE 03/09/2016 0205   PROTEINUR negative 03/14/2016 1456   PROTEINUR 30 (A) 03/09/2016 0205   UROBILINOGEN negative 03/14/2016 1456   UROBILINOGEN 0.2 03/10/2012 0015   NITRITE negative 03/14/2016 1456   NITRITE NEGATIVE 03/09/2016 0205   LEUKOCYTESUR Negative 03/14/2016 1456    Radiological Exams on Admission: Dg Chest Port 1 View  Result Date: 02/01/2018 CLINICAL DATA:  Chest pain EXAM: PORTABLE CHEST 1 VIEW COMPARISON:  07/01/2017, 03/23/2017 FINDINGS: Post sternotomy changes. Cardiomegaly with mild central congestion. No focal airspace disease or effusion. No pneumothorax. IMPRESSION: No active disease. Electronically Signed   By: Donavan Foil M.D.   On: 02/01/2018 18:29    EKG: Independently reviewed.  SINUS rhythm no acute ST or T wave abnormalities normal intervals  Assessment/Plan Principal Problem:   Chest pain ATYPICAL Active Problems:   Hyperlipidemia   Coronary artery disease involving coronary bypass graft of native heart with angina pectoris (HCC)   GERD   Systemic lupus erythematosus with focal and segmental proliferative glomerulonephritis ([place])   Hypothyroidism CKD ITP   -Admit to telemetry for observation, cycle cardiac enzymes, CONT aspirin, statin,  beta-blocker , check fasting lipid panel, consult cardiology.  D-dimer mildly elevated check CTA chest rule out PE  -Continue home meds for lupus -Continue thyroid supplementation -Chronic kidney disease stable -Patient with history of ITP status post splenectomy.  Platelets markedly lower today at 34,000.  Repeat in the morning  consider hematology consultation  DVT prophylaxis: LOVENOX Code Status: FULL  Disposition Plan: HOME 1 DAY   Admission status: OBS TELE  Macallan Ord Johnson-Pitts MD [facility] Pager [phone]  If 7PM-7AM, please contact night-coverage [email or web address] Password [facility]  02/01/2018, 10:55 PM

## 2018-02-02 ENCOUNTER — Encounter (HOSPITAL_COMMUNITY): Payer: Self-pay | Admitting: Physician Assistant

## 2018-02-02 ENCOUNTER — Other Ambulatory Visit: Payer: Self-pay

## 2018-02-02 DIAGNOSIS — N051 Unspecified nephritic syndrome with focal and segmental glomerular lesions: Secondary | ICD-10-CM | POA: Diagnosis not present

## 2018-02-02 DIAGNOSIS — I25709 Atherosclerosis of coronary artery bypass graft(s), unspecified, with unspecified angina pectoris: Secondary | ICD-10-CM | POA: Diagnosis not present

## 2018-02-02 DIAGNOSIS — I739 Peripheral vascular disease, unspecified: Secondary | ICD-10-CM | POA: Diagnosis not present

## 2018-02-02 DIAGNOSIS — R0789 Other chest pain: Secondary | ICD-10-CM | POA: Diagnosis not present

## 2018-02-02 DIAGNOSIS — N182 Chronic kidney disease, stage 2 (mild): Secondary | ICD-10-CM | POA: Diagnosis not present

## 2018-02-02 DIAGNOSIS — M3214 Glomerular disease in systemic lupus erythematosus: Secondary | ICD-10-CM | POA: Diagnosis not present

## 2018-02-02 DIAGNOSIS — I10 Essential (primary) hypertension: Secondary | ICD-10-CM | POA: Diagnosis not present

## 2018-02-02 DIAGNOSIS — E785 Hyperlipidemia, unspecified: Secondary | ICD-10-CM | POA: Diagnosis not present

## 2018-02-02 DIAGNOSIS — D693 Immune thrombocytopenic purpura: Secondary | ICD-10-CM | POA: Diagnosis not present

## 2018-02-02 DIAGNOSIS — I2510 Atherosclerotic heart disease of native coronary artery without angina pectoris: Secondary | ICD-10-CM | POA: Diagnosis not present

## 2018-02-02 DIAGNOSIS — K219 Gastro-esophageal reflux disease without esophagitis: Secondary | ICD-10-CM | POA: Diagnosis not present

## 2018-02-02 DIAGNOSIS — I77810 Thoracic aortic ectasia: Secondary | ICD-10-CM

## 2018-02-02 DIAGNOSIS — E039 Hypothyroidism, unspecified: Secondary | ICD-10-CM | POA: Diagnosis not present

## 2018-02-02 DIAGNOSIS — D696 Thrombocytopenia, unspecified: Secondary | ICD-10-CM | POA: Diagnosis not present

## 2018-02-02 LAB — CBC WITH DIFFERENTIAL/PLATELET
Abs Immature Granulocytes: 0 K/uL (ref 0.00–0.07)
Basophils Absolute: 0 K/uL (ref 0.0–0.1)
Basophils Relative: 1 %
Eosinophils Absolute: 0.2 K/uL (ref 0.0–0.5)
Eosinophils Relative: 5 %
HCT: 37.2 % (ref 36.0–46.0)
Hemoglobin: 12.3 g/dL (ref 12.0–15.0)
Immature Granulocytes: 0 %
Lymphocytes Relative: 24 %
Lymphs Abs: 0.7 K/uL (ref 0.7–4.0)
MCH: 32.5 pg (ref 26.0–34.0)
MCHC: 33.1 g/dL (ref 30.0–36.0)
MCV: 98.4 fL (ref 80.0–100.0)
Monocytes Absolute: 0.6 K/uL (ref 0.1–1.0)
Monocytes Relative: 19 %
Neutro Abs: 1.5 K/uL — ABNORMAL LOW (ref 1.7–7.7)
Neutrophils Relative %: 51 %
Platelets: 172 K/uL (ref 150–400)
RBC: 3.78 MIL/uL — ABNORMAL LOW (ref 3.87–5.11)
RDW: 15.7 % — ABNORMAL HIGH (ref 11.5–15.5)
WBC: 2.9 K/uL — ABNORMAL LOW (ref 4.0–10.5)
nRBC: 0 % (ref 0.0–0.2)

## 2018-02-02 MED ORDER — AMLODIPINE BESYLATE 5 MG PO TABS: 5.0000 mg | ORAL_TABLET | Freq: Every day | ORAL | 0 refills | Status: DC

## 2018-02-02 MED ORDER — AMLODIPINE BESYLATE 5 MG PO TABS
5.0000 mg | ORAL_TABLET | Freq: Every day | ORAL | Status: DC
  Administered 2018-02-02: 5 mg via ORAL
  Filled 2018-02-02: qty 1

## 2018-02-02 NOTE — Discharge Instructions (Signed)
Your CT scan showed dilation of the big blood vessel in your body called the aorta. When this blood vessel is dilated, it is called an aneurysm. Yours will be followed on a semi-annual basis for now. Mainstays of therapy for aneurysms include good blood pressure control, healthy lifestyle, and avoiding fluoroquinolone antibiotic medications (such as those in the "Cipro" class, ending in "floxacin") due to risk of damage to the aorta. Since aneurysms can run in families, you should discuss your diagnosis with first degree relatives as they may need to be screened for this. Regular mild-moderate physical exercise is OK but avoid heavy lifting/weight lifting over 30lbs, chopping wood, shoveling snow or digging heavy earth with a shovel. I typically refer patients to this flyer (https://medicine.[employer].tn.pdf) for more information, keeping in mind of some of this information also pertains to people who have had surgery for this.

## 2018-02-02 NOTE — Progress Notes (Signed)
Appt scheduled 11/26 placed on AVS Ladawn Boullion PA-C

## 2018-02-02 NOTE — Consult Note (Addendum)
Cardiology Consultation:   Patient ID: [name]; [id number]; 06-06-1955   Admit date: 02/01/2018 Date of Consult: 02/02/2018  Primary Care Provider: Janith Lima, MD Primary Cardiologist: Sinclair Grooms, MD Primary Electrophysiologist:  None  Chief Complaint: chest pain, headache  Patient Profile:   [name] is a 62 y.o. female with a hx of CAD s/p remote stents/angioplasty then CABG 01/2017 with mitral valve annuloplasty, post-op atrial fib, lupus followed by rheumatology, CKD stage II, ITP s/p splenectomy, GERD, hypothyroidism who is being seen today for the evaluation of CP at the request of Dr. Baron Hamper.  History of Present Illness:   Last 2D echo was done prior to CABG/MV surgery, showing EF 55-60%, grade 2 DD, mild AI, severe MR/TR, PASP 40mmHg. In 11/2017 Lopressor was consolidated to Toprol and she was advised to follow BP at home. Over the last few weeks she's been noticing her BP spiking to the 150/100 range. She also has noticed more intermittent back pain and right shoulder pain without provoking injury. It is not worse with anything or resolve with anything in particular, comes on at random. Because of this, she has been taking a lot of aspirin for a week. Yesterday morning while resting she developed a substernal chest discomfort described as a "thumping" sensation that lasted for a few minutes. No nausea, vomiting, diaphoresis, SOB reported. She indicates she walks regularly and has not had any exertional angina or dyspnea. No LEE, orthopnea, syncope. Troponins neg x2. WBC 2.2, plt 34, Cr 1.09, d-dimer 0.68. CT angio yesterday showed no PE; + aneurysmal dilatation of thoracic aorta 4.5cm (semi-annual follow-up suggested), coronary calcification, atherosclerotic changes, scattered subsegmental atx, scarring and air trapping in apices. EKG without acute change from prior. Pain resolved with morphine overnight, currently asymptomatic. Her platelet count  is much lower than prior value in 06/2017. VSS except intermittently hypertensive on arrival which has normalized.   Past Medical History:  Diagnosis Date  . CAD (coronary artery disease)    a. remote stents/angioplasty. b. s/p CABG 01/2017 with mitral valve annuloplasty.  . Esophageal reflux   . GERD (gastroesophageal reflux disease)   . Hypothyroidism   . Immune thrombocytopenic purpura ([place])   . Lupus erythematosus   . Mitral valve insufficiency and aortic valve insufficiency   . Myocardial infarction ([place])    x 2  . Peripheral vascular disease, unspecified ([place])   . PONV (postoperative nausea and vomiting)   . Postoperative atrial fibrillation ([place])   . Severe mitral regurgitation    a. s/p MV annuloplasty 01/2017 at time of CABG.  . Status post mitral valve annuloplasty   . Unspecified disease of pericardium     Past Surgical History:  Procedure Laterality Date  . CHOLECYSTECTOMY, LAPAROSCOPIC  2010  . CORONARY ARTERY BYPASS GRAFT N/A 01/30/2017   Procedure: CORONARY ARTERY BYPASS GRAFTING (CABG), Times four  using the right saphaneous vein, harvested endoscopicly.  and left internal mammary artery .;  Surgeon: Gaye Pollack, MD;  Location: [facility];  Service: Open Heart Surgery;  Laterality: N/A;  . LEFT HEART CATH AND CORONARY ANGIOGRAPHY N/A 01/23/2017   Procedure: LEFT HEART CATH AND CORONARY ANGIOGRAPHY;  Surgeon: Belva Crome, MD;  Location: [facility];  Service: Cardiovascular;  Laterality: N/A;  . MITRAL VALVE REPAIR N/A 01/30/2017   Procedure: MITRAL VALVE REPAIR (MVR);  Surgeon: Gaye Pollack, MD;  Location: [facility];  Service: Open Heart Surgery;  Laterality: N/A;  . plastic surgical repair  dog bite to leg  . SPLENECTOMY  5-04  . TEE WITHOUT CARDIOVERSION N/A 01/27/2017   Procedure: TRANSESOPHAGEAL ECHOCARDIOGRAM (TEE);  Surgeon: Sanda Klein, MD;  Location: [facility];  Service: Cardiovascular;  Laterality: N/A;  . TEE WITHOUT CARDIOVERSION N/A  01/30/2017   Procedure: TRANSESOPHAGEAL ECHOCARDIOGRAM (TEE);  Surgeon: Gaye Pollack, MD;  Location: [facility];  Service: Open Heart Surgery;  Laterality: N/A;  . ULTRASOUND GUIDANCE FOR VASCULAR ACCESS  01/23/2017   Procedure: Ultrasound Guidance For Vascular Access;  Surgeon: Belva Crome, MD;  Location: [facility];  Service: Cardiovascular;;     Inpatient Medications: Scheduled Meds: . azaTHIOprine  50 mg Oral BID  . hydroxychloroquine  200 mg Oral Daily  . iopamidol      . levothyroxine  75 mcg Oral Q0600  . metoprolol succinate  50 mg Oral Daily  . pantoprazole  40 mg Oral Daily  . rosuvastatin  20 mg Oral Daily   Continuous Infusions: . sodium chloride 125 mL/hr at 02/02/18 0016   PRN Meds: acetaminophen, iopamidol, morphine injection, nitroGLYCERIN, ondansetron (ZOFRAN) IV  Home Meds: Prior to Admission medications   Medication Sig Start Date End Date Taking? Authorizing Provider  aspirin 81 MG chewable tablet Chew 81 mg by mouth daily.    Yes [provider]  azaTHIOprine (IMURAN) 50 MG tablet Take 50 mg by mouth 2 (two) times daily.   Yes [provider]  hydroxychloroquine (PLAQUENIL) 200 MG tablet Take 200 mg by mouth daily.    Yes [provider]  levothyroxine (SYNTHROID, LEVOTHROID) 75 MCG tablet Take 1 tablet (75 mcg total) by mouth daily. 01/20/18  Yes Janith Lima, MD  metoprolol succinate (TOPROL-XL) 50 MG 24 hr tablet Take 1 tablet (50 mg total) by mouth daily. Take with or immediately following a meal. 12/02/17 03/02/18 Yes Belva Crome, MD  pantoprazole (PROTONIX) 40 MG tablet TAKE 1 TABLET BY MOUTH EVERY DAY Patient taking differently: Take 40 mg by mouth daily.  08/12/17  Yes Belva Crome, MD  rosuvastatin (CRESTOR) 20 MG tablet TAKE 1 TABLET BY MOUTH EVERY DAY Patient taking differently: Take 20 mg by mouth daily.  12/01/17  Yes Burnell Blanks, MD  HYDROcodone-acetaminophen (NORCO) 5-325 MG tablet Take 1 tablet  by mouth every 6 (six) hours as needed for moderate pain. Patient not taking: Reported on 02/01/2018 07/03/17   Marrian Salvage, FNP    Allergies:   No Known Allergies  Social History:   Social History   Socioeconomic History  . Marital status: Single    Spouse name: Not on file  . Number of children: Not on file  . Years of education: Not on file  . Highest education level: Not on file  Occupational History  . Not on file  Social Needs  . Financial resource strain: Not on file  . Food insecurity:    Worry: Not on file    Inability: Not on file  . Transportation needs:    Medical: Not on file    Non-medical: Not on file  Tobacco Use  . Smoking status: Former Smoker    Packs/day: 0.50    Years: 30.00    Pack years: 15.00    Types: Cigarettes    Last attempt to quit: 12/23/2011    Years since quitting: 6.1  . Smokeless tobacco: Never Used  Substance and Sexual Activity  . Alcohol use: No  . Drug use: No  . Sexual activity: Not Currently  Comment: 1st intercourse 62 yo-More than 5 partners  Lifestyle  . Physical activity:    Days per week: Not on file    Minutes per session: Not on file  . Stress: Not on file  Relationships  . Social connections:    Talks on phone: Not on file    Gets together: Not on file    Attends religious service: Not on file    Active member of club or organization: Not on file    Attends meetings of clubs or organizations: Not on file    Relationship status: Not on file  . Intimate partner violence:    Fear of current or ex partner: Not on file    Emotionally abused: Not on file    Physically abused: Not on file    Forced sexual activity: Not on file  Other Topics Concern  . Not on file  Social History Narrative   HSG,  graduated from [employer] in [place]. In college [name] -grad '13 with relgious studies. [place] - Fall '13 for MA-divinity. Marrried '73 - 2 years/ divorced. 2 son- '73, '75 - CP.    Occupation:  full-time Ship broker. She lives alone with her younger son living with her part-time but he resides in a managed care facility.     Family History:   The patient's family history includes Cancer in her mother and paternal grandfather; Fibromyalgia in her mother; GER disease in her father; Heart attack in her maternal grandmother; Heart disease in her father; Hypertension in her father; Paget's disease of bone in her mother.  ROS:  Please see the history of present illness.  All other ROS reviewed and negative.     Physical Exam/Data:   Vitals:   02/01/18 2045 02/01/18 2116 02/02/18 0259 02/02/18 0604  BP: (!) 154/85 (!) 149/94 125/78 119/77  Pulse: (!) 58 (!) 58 65 64  Resp: 16 15 16 15   Temp:  98.2 F (36.8 C) 98.6 F (37 C) 98.8 F (37.1 C)  TempSrc:  Oral Oral Oral  SpO2: 100% 100% 99% 97%  Weight:      Height:        Intake/Output Summary (Last 24 hours) at 02/02/2018 0810 Last data filed at 02/02/2018 0300 Gross per 24 hour  Intake 341.67 ml  Output -  Net 341.67 ml   Filed Weights   02/01/18 1843  Weight: 106.6 kg   Body mass index is 36.81 kg/m.  General: Well developed, well nourished obese AAF lying flat in bed, in no acute distress. Head: Normocephalic, atraumatic, sclera non-icteric, no xanthomas, nares are without discharge.  Neck: Negative for carotid bruits. JVD not elevated. Lungs: Clear bilaterally to auscultation without wheezes, rales, or rhonchi. Breathing is unlabored. Heart: RRR with S1 S2. Soft SEM at RSB. No rubs or gallops appreciated. Abdomen: Soft, non-tender, non-distended with normoactive bowel sounds. No hepatomegaly. No rebound/guarding. No obvious abdominal masses. Msk:  Strength and tone appear normal for age. Extremities: No clubbing or cyanosis. No edema.  Distal pedal pulses are 2+ and equal bilaterally. Neuro: Alert and oriented X 3. No facial asymmetry. No focal deficit. Moves all extremities spontaneously. Psych:  Responds to  questions appropriately with a normal affect.  EKG:  The EKG was personally reviewed and demonstrates NSR nonspecific TW changes in III, avF, V6. Nonspecific TW changes have been noted in these leads before.  Laboratory Data:  Chemistry Recent Labs  Lab 02/01/18 1810  NA 137  K 4.1  CL 107  CO2 25  GLUCOSE 85  BUN 12  CREATININE 1.09*  CALCIUM 8.9  GFRNONAA 53*  GFRAA >60  ANIONGAP 5    Recent Labs  Lab 02/01/18 1810  PROT 6.8  ALBUMIN 3.4*  AST 27  ALT 15  ALKPHOS 79  BILITOT 0.5   Hematology Recent Labs  Lab 02/01/18 1810  WBC 2.2*  RBC 3.98  HGB 13.2  HCT 39.5  MCV 99.2  MCH 33.2  MCHC 33.4  RDW 15.6*  PLT 34*   Cardiac Enzymes Recent Labs  Lab 02/01/18 2121  TROPONINI <0.03    Recent Labs  Lab 02/01/18 1816  TROPIPOC 0.01    BNPNo results for input(s): BNP, PROBNP in the last 168 hours.  DDimer  Recent Labs  Lab 02/01/18 2121  DDIMER 0.68*    Radiology/Studies:  Ct Angio Chest Pe W Or Wo Contrast  Result Date: 02/01/2018 CLINICAL DATA:  Intermittent chest and back pain. EXAM: CT ANGIOGRAPHY CHEST WITH CONTRAST TECHNIQUE: Multidetector CT imaging of the chest was performed using the standard protocol during bolus administration of intravenous contrast. Multiplanar CT image reconstructions and MIPs were obtained to evaluate the vascular anatomy. CONTRAST:  169mL ISOVUE-370 IOPAMIDOL (ISOVUE-370) INJECTION 76% COMPARISON:  CT of the chest January 17, 2017. Chest x-ray February 01, 2018. FINDINGS: Cardiovascular: Poor opacification of the thoracic aorta due to timing of contrast. The proximal portion of the aortic arch measures 4.5 cm, unchanged. No other aneurysm. Atherosclerotic changes are noted. Evaluation for dissection limited due to timing of contrast. Coronary artery calcifications. The heart is unchanged. No pulmonary emboli identified. Mediastinum/Nodes: No effusions. Small hiatal hernia. The thyroid is unremarkable. No adenopathy.  Lungs/Pleura: Central airways are normal. No pneumothorax. Scattered subsegmental atelectasis. No suspicious infiltrate. Probable air trapping in the apices, stable since October 2018. No nodules or masses. Upper Abdomen: No acute abnormality. Musculoskeletal: No chest wall abnormality. No acute or significant osseous findings. Review of the MIP images confirms the above findings. IMPRESSION: 1. No pulmonary emboli identified. 2. Aneurysmal dilatation of the thoracic aorta at the anterior aspect of the arch measuring 4.5 cm Recommend semi-annual imaging followup by CTA or MRA and referral to cardiothoracic surgery if not already obtained. This recommendation follows 2010 ACCF/AHA/AATS/ACR/ASA/SCA/SCAI/SIR/STS/SVM Guidelines for the Diagnosis and Management of Patients With Thoracic Aortic Disease. Circulation. 2010; 121: [id number] 3. Coronary artery calcifications. 4. Atherosclerotic changes in the thoracic aorta. 5. Scattered subsegmental atelectasis. Scarring and air trapping in the apices. Aortic Atherosclerosis (ICD10-I70.0). Electronically Signed   By: Dorise Bullion III M.D   On: 02/01/2018 23:51   Dg Chest Port 1 View  Result Date: 02/01/2018 CLINICAL DATA:  Chest pain EXAM: PORTABLE CHEST 1 VIEW COMPARISON:  07/01/2017, 03/23/2017 FINDINGS: Post sternotomy changes. Cardiomegaly with mild central congestion. No focal airspace disease or effusion. No pneumothorax. IMPRESSION: No active disease. Electronically Signed   By: Donavan Foil M.D.   On: 02/01/2018 18:29    Assessment and Plan:   1. Back/shoulder pain for several weeks, and atypical "thumping" CP yesterday - symptoms resolved with normalized BP. She has ruled out for MI. EKG with nonspecific TW changes. Telemetry is stable. Question related to BP spikes. May benefit from addition of amlodipine both from BP and anti-anginal standpoint. I will review with MD. Also note dramatically lower platelet count from prior.  2. H/o post-op AF -  maintaining NSR this admission. Will need to observe for recurrent sx as OP.  3. H/o ITP s/p splenectomy, now with recurrent severe thrombocytopenia -  repeat CBC pending. Recommend IM discuss with hematology.  4. HTN - consider addition of amlodipine. Pending review with Dr. Marlou Porch.  5. 4.5cm ascending thoracic aneurysm - will need semi-annual follow-up as OP, along with good BP management. Will include aneurysm precaution info on AVS. MD will review with pt.  For questions or updates, please contact [place] Please consult [email or web address] for contact info under Cardiology/STEMI.    Signed, Charlie Pitter, PA-C  02/02/2018 8:10 AM  Personally seen and examined. Agree with above.  62 year old with known coronary disease status post CABG and mitral valve repair 01/2017, 1 year ago with thrombocytopenia status post splenectomy for ITP, lupus here with atypical chest discomfort, hypertension and dilated aortic root, 4.5 cm.  Postoperatively her aortic measurement was in the 3 cm range.  Currently she is laying comfortably in bed.  No further thumping-like chest discomfort.  She had hypertension surrounding this.  Physical exam: Alert and oriented x3 no acute distress, appears comfortable in bed, overweight, heart regular rate and rhythm without any rubs.  She does have a 2/6 systolic murmur heard in the right and left upper sternal border.  Trace lower extremity edema.  Abdomen soft nontender.  Prior splenectomy.  Labs: Troponins have been normal.  EKG personally reviewed and interpreted shows sinus rhythm with nonspecific T wave changes, no significant change from prior   Assessment and plan:  Atypical chest pain - Thumping like sensation, fleeting, may have been associated with increased blood pressure.  Thankfully, there is no evidence of acute coronary syndrome.  Nonspecific T wave changes noted on ECG.  Recent revascularization last year with CABG.  Currently pain-free.  I think that  we should continue to optimize her blood pressure control which when she was last seen by Dr. Pernell Dupre in clinic this was discussed.  Previously her Toprol-XL had been increased or consolidated to 50 mg once a day. - We will add amlodipine 5 mg once a day.  Dilated aortic root -4.5 cm noted on the CT scan.  Previously aortic ectasia was reported on postoperative CT scan.  This seems to be a change however.  I think that we should repeat this CT scan in 6 months to make sure that there is been no further widening.  Blood pressure control of utmost importance.  There is no evidence of dissection.  She is currently pain-free.  Thrombocytopenia -37,000.  Prior to this it was in the normal range.  We have discussed with the primary team, Dr. Eliseo Squires.  If it remains this low, she likely needs to discontinue her aspirin to avoid bleeding complications.  She does not have any bruising.  Morbid obesity -BMI greater than 35 with 2 or more comorbidities.  Continue to encourage weight loss.  No further cardiac testing at this time. Check CT scan of aorta in 6 months to monitor diameter Avoid Cipro  We will make sure that she has close follow-up.  No further recommendations at this time.  Candee Furbish, MD

## 2018-02-02 NOTE — Discharge Summary (Signed)
Physician Discharge Summary  [name] ZMO:[id number] DOB: September 28, 1955 DOA: 02/01/2018  PCP: Janith Lima, MD  Admit date: 02/01/2018 Discharge date: 02/02/2018  Admitted From: home Discharge disposition: home   Recommendations for Outpatient Follow-Up:   Cbc 1 week re: plts  Discharge Diagnosis:   Principal Problem:   Chest pain Active Problems:   Hyperlipidemia   Coronary artery disease involving coronary bypass graft of native heart with angina pectoris (HCC)   GERD   Systemic lupus erythematosus with focal and segmental proliferative glomerulonephritis ([place])   Hypothyroidism    Discharge Condition: Improved.  Diet recommendation: Low sodium, heart healthy  Wound care: None.  Code status: Full.   History of Present Illness:   [name] is a 62 y.o. female with medical history significant of coronary artery disease status post CABG, lupus, chronic kidney disease, ITP status post splenectomy presents with atypical chest pain.  Patient had chest pain on and off for the past week or so.  Pain was located substernally.  She states it feels like a tingling numbing sensation.  At times radiates to her right arm.  Nothing seems to make it better or worse.  She did try Tylenol and aspirin at home.  She does have have some occasional nerve discomfort in her chest and her leg due to her bypass surgery but this is different.  Patient thus far has a negative chest x-ray nonacute EKG and a negative troponin.  Patient does have a history of lupus currently on medical management per her rheumatologist.  Denies any history of DVT PE.  Denies any recent change in her medications.  Follows with Dr. Tamala Julian cardiology.  She NOTED her blood pressure has been elevated in 150 range over the past 3 weeks.  Her blood pressure med was changed by her cardiologist back in September   Hospital Course by Problem:   Atypical chest pain -norvasc added -CE  negative -outpatient follow up  H/o ITP S/p spleen removal -plts initially were low but repeat both around 175 (suspect lab error in first) -recheck CBC 1 week-- resume ASA  Dilated aortic root -4.5 cm noted on the CT scan.  Previously aortic ectasia was reported on postoperative CT scan.  This seems to be a change however.  I think that we should repeat this CT scan in 6 months to make sure that there is been no further widening.   Obesity Estimated body mass index is 36.81 kg/m as calculated from the following:   Height as of this encounter: 5\' 7"  (1.702 m).   Weight as of this encounter: 106.6 kg.    Medical Consultants:      Discharge Exam:   Vitals:   02/02/18 1025 02/02/18 1202  BP: 111/74 116/69  Pulse:  64  Resp:  15  Temp:  98.6 F (37 C)  SpO2: 95% 95%   Vitals:   02/02/18 0259 02/02/18 0604 02/02/18 1025 02/02/18 1202  BP: 125/78 119/77 111/74 116/69  Pulse: 65 64  64  Resp: 16 15  15   Temp: 98.6 F (37 C) 98.8 F (37.1 C)  98.6 F (37 C)  TempSrc: Oral Oral Oral Oral  SpO2: 99% 97% 95% 95%  Weight:      Height:        General exam: Appears calm and comfortable.   The results of significant diagnostics from this hospitalization (including imaging, microbiology, ancillary and laboratory) are listed below for reference.  Procedures and Diagnostic Studies:   Ct Angio Chest Pe W Or Wo Contrast  Result Date: 02/01/2018 CLINICAL DATA:  Intermittent chest and back pain. EXAM: CT ANGIOGRAPHY CHEST WITH CONTRAST TECHNIQUE: Multidetector CT imaging of the chest was performed using the standard protocol during bolus administration of intravenous contrast. Multiplanar CT image reconstructions and MIPs were obtained to evaluate the vascular anatomy. CONTRAST:  124mL ISOVUE-370 IOPAMIDOL (ISOVUE-370) INJECTION 76% COMPARISON:  CT of the chest January 17, 2017. Chest x-ray February 01, 2018. FINDINGS: Cardiovascular: Poor opacification of the thoracic aorta  due to timing of contrast. The proximal portion of the aortic arch measures 4.5 cm, unchanged. No other aneurysm. Atherosclerotic changes are noted. Evaluation for dissection limited due to timing of contrast. Coronary artery calcifications. The heart is unchanged. No pulmonary emboli identified. Mediastinum/Nodes: No effusions. Small hiatal hernia. The thyroid is unremarkable. No adenopathy. Lungs/Pleura: Central airways are normal. No pneumothorax. Scattered subsegmental atelectasis. No suspicious infiltrate. Probable air trapping in the apices, stable since October 2018. No nodules or masses. Upper Abdomen: No acute abnormality. Musculoskeletal: No chest wall abnormality. No acute or significant osseous findings. Review of the MIP images confirms the above findings. IMPRESSION: 1. No pulmonary emboli identified. 2. Aneurysmal dilatation of the thoracic aorta at the anterior aspect of the arch measuring 4.5 cm Recommend semi-annual imaging followup by CTA or MRA and referral to cardiothoracic surgery if not already obtained. This recommendation follows 2010 ACCF/AHA/AATS/ACR/ASA/SCA/SCAI/SIR/STS/SVM Guidelines for the Diagnosis and Management of Patients With Thoracic Aortic Disease. Circulation. 2010; 121: e266-e36 3. Coronary artery calcifications. 4. Atherosclerotic changes in the thoracic aorta. 5. Scattered subsegmental atelectasis. Scarring and air trapping in the apices. Aortic Atherosclerosis (ICD10-I70.0). Electronically Signed   By: Dorise Bullion III M.D   On: 02/01/2018 23:51   Dg Chest Port 1 View  Result Date: 02/01/2018 CLINICAL DATA:  Chest pain EXAM: PORTABLE CHEST 1 VIEW COMPARISON:  07/01/2017, 03/23/2017 FINDINGS: Post sternotomy changes. Cardiomegaly with mild central congestion. No focal airspace disease or effusion. No pneumothorax. IMPRESSION: No active disease. Electronically Signed   By: Donavan Foil M.D.   On: 02/01/2018 18:29     Labs:   Basic Metabolic Panel: Recent Labs   Lab 02/01/18 1810  NA 137  K 4.1  CL 107  CO2 25  GLUCOSE 85  BUN 12  CREATININE 1.09*  CALCIUM 8.9   GFR Estimated Creatinine Clearance: 67.2 mL/min (A) (by C-G formula based on SCr of 1.09 mg/dL (H)). Liver Function Tests: Recent Labs  Lab 02/01/18 1810  AST 27  ALT 15  ALKPHOS 79  BILITOT 0.5  PROT 6.8  ALBUMIN 3.4*   No results for input(s): LIPASE, AMYLASE in the last 168 hours. No results for input(s): AMMONIA in the last 168 hours. Coagulation profile No results for input(s): INR, PROTIME in the last 168 hours.  CBC: Recent Labs  Lab 02/01/18 1810 02/02/18 0810  WBC 2.2* 2.9*  NEUTROABS  --  1.5*  HGB 13.2 12.3  HCT 39.5 37.2  MCV 99.2 98.4  PLT 34* 172   Cardiac Enzymes: Recent Labs  Lab 02/01/18 2121  TROPONINI <0.03   BNP: Invalid input(s): POCBNP CBG: No results for input(s): GLUCAP in the last 168 hours. D-Dimer Recent Labs    02/01/18 2121  DDIMER 0.68*   Hgb A1c No results for input(s): HGBA1C in the last 72 hours. Lipid Profile No results for input(s): CHOL, HDL, LDLCALC, TRIG, CHOLHDL, LDLDIRECT in the last 72 hours. Thyroid function studies No results for  input(s): TSH, T4TOTAL, T3FREE, THYROIDAB in the last 72 hours.  Invalid input(s): FREET3 Anemia work up No results for input(s): VITAMINB12, FOLATE, FERRITIN, TIBC, IRON, RETICCTPCT in the last 72 hours. Microbiology No results found for this or any previous visit (from the past 240 hour(s)).   Discharge Instructions:   Discharge Instructions    Diet - low sodium heart healthy   Complete by:  As directed    Discharge instructions   Complete by:  As directed    Cbc 1 week Hold ASA until repeat CBC Avoid NSAIDs-- use tylenol instead   Increase activity slowly   Complete by:  As directed      Allergies as of 02/02/2018   No Known Allergies     Medication List    STOP taking these medications   aspirin 81 MG chewable tablet   HYDROcodone-acetaminophen 5-325  MG tablet Commonly known as:  NORCO/VICODIN     TAKE these medications   amLODipine 5 MG tablet Commonly known as:  NORVASC Take 1 tablet (5 mg total) by mouth daily. Start taking on:  02/03/2018   azaTHIOprine 50 MG tablet Commonly known as:  IMURAN Take 50 mg by mouth 2 (two) times daily.   hydroxychloroquine 200 MG tablet Commonly known as:  PLAQUENIL Take 200 mg by mouth daily.   levothyroxine 75 MCG tablet Commonly known as:  SYNTHROID, LEVOTHROID Take 1 tablet (75 mcg total) by mouth daily.   metoprolol succinate 50 MG 24 hr tablet Commonly known as:  TOPROL-XL Take 1 tablet (50 mg total) by mouth daily. Take with or immediately following a meal.   pantoprazole 40 MG tablet Commonly known as:  PROTONIX TAKE 1 TABLET BY MOUTH EVERY DAY   rosuvastatin 20 MG tablet Commonly known as:  CRESTOR TAKE 1 TABLET BY MOUTH EVERY DAY      Follow-up Information    Burtis Junes, NP Follow up.   Specialties:  Nurse Practitioner, Interventional Cardiology, Cardiology, Radiology Why:  [facility] - 02/16/18 at 10am. Arrive 15 minutes prior to appointment to check in. Cecille Rubin is one of Dr. Thompson Caul nurse practitioners that works on his care team. Contact information: [place] [place] [address] [phone]        Janith Lima, MD Follow up in 1 week(s).   Specialty:  Internal Medicine Why:  [facility] Contact information: [address] [address] [phone]            Time coordinating discharge: 25 min  Signed:  Geradine Girt DO  [facility] 02/02/2018, 1:24 PM

## 2018-02-03 ENCOUNTER — Encounter: Payer: Self-pay | Admitting: Internal Medicine

## 2018-02-03 ENCOUNTER — Ambulatory Visit (INDEPENDENT_AMBULATORY_CARE_PROVIDER_SITE_OTHER): Payer: BLUE CROSS/BLUE SHIELD | Admitting: Internal Medicine

## 2018-02-03 ENCOUNTER — Other Ambulatory Visit (INDEPENDENT_AMBULATORY_CARE_PROVIDER_SITE_OTHER): Payer: BLUE CROSS/BLUE SHIELD

## 2018-02-03 VITALS — BP 138/88 | HR 62 | Temp 98.0°F | Ht 67.0 in | Wt 249.0 lb

## 2018-02-03 DIAGNOSIS — D693 Immune thrombocytopenic purpura: Secondary | ICD-10-CM

## 2018-02-03 DIAGNOSIS — E785 Hyperlipidemia, unspecified: Secondary | ICD-10-CM | POA: Diagnosis not present

## 2018-02-03 DIAGNOSIS — I25709 Atherosclerosis of coronary artery bypass graft(s), unspecified, with unspecified angina pectoris: Secondary | ICD-10-CM

## 2018-02-03 DIAGNOSIS — E039 Hypothyroidism, unspecified: Secondary | ICD-10-CM

## 2018-02-03 LAB — CBC WITH DIFFERENTIAL/PLATELET
Basophils Absolute: 0.1 10*3/uL (ref 0.0–0.1)
Basophils Relative: 1.7 % (ref 0.0–3.0)
Eosinophils Absolute: 0.1 10*3/uL (ref 0.0–0.7)
Eosinophils Relative: 3.4 % (ref 0.0–5.0)
HCT: 39 % (ref 36.0–46.0)
Hemoglobin: 13.2 g/dL (ref 12.0–15.0)
Lymphocytes Relative: 20.1 % (ref 12.0–46.0)
Lymphs Abs: 0.6 10*3/uL — ABNORMAL LOW (ref 0.7–4.0)
MCHC: 34 g/dL (ref 30.0–36.0)
MCV: 99.7 fl (ref 78.0–100.0)
Monocytes Absolute: 0.6 10*3/uL (ref 0.1–1.0)
Monocytes Relative: 18.1 % — ABNORMAL HIGH (ref 3.0–12.0)
Neutro Abs: 1.7 10*3/uL (ref 1.4–7.7)
Neutrophils Relative %: 56.7 % (ref 43.0–77.0)
Platelets: 182 10*3/uL (ref 150.0–400.0)
RBC: 3.91 Mil/uL (ref 3.87–5.11)
RDW: 15.2 % (ref 11.5–15.5)
WBC: 3.1 10*3/uL — ABNORMAL LOW (ref 4.0–10.5)

## 2018-02-03 LAB — LIPID PANEL
Cholesterol: 134 mg/dL (ref 0–200)
HDL: 55.9 mg/dL (ref >39.00)
LDL Cholesterol: 68 mg/dL (ref 0–99)
NonHDL: 77.94
Total CHOL/HDL Ratio: 2
Triglycerides: 52 mg/dL (ref 0.0–149.0)
VLDL: 10.4 mg/dL (ref 0.0–40.0)

## 2018-02-03 LAB — TSH: TSH: 0.74 u[IU]/mL (ref 0.35–4.50)

## 2018-02-03 NOTE — Progress Notes (Signed)
Subjective:  Patient ID: [name], female    DOB: 09/25/1955  Age: 62 y.o. MRN: [id number]  CC: Hyperlipidemia and Hypothyroidism   HPI [name] presents for f/up - She was recently admitted for an episode of atypical chest pain.  Her work-up was unremarkable.  The chest pain has resolved.  She had a brief episode of low platelets during the admission.  She denies any episodes of easy bruising or bleeding.  She feels well today and offers no complaints.  Outpatient Medications Prior to Visit  Medication Sig Dispense Refill  . amLODipine (NORVASC) 5 MG tablet Take 1 tablet (5 mg total) by mouth daily. 30 tablet 0  . azaTHIOprine (IMURAN) 50 MG tablet Take 50 mg by mouth 2 (two) times daily.    . hydroxychloroquine (PLAQUENIL) 200 MG tablet Take 200 mg by mouth daily.     Marland Kitchen levothyroxine (SYNTHROID, LEVOTHROID) 75 MCG tablet Take 1 tablet (75 mcg total) by mouth daily. 90 tablet 0  . metoprolol succinate (TOPROL-XL) 50 MG 24 hr tablet Take 1 tablet (50 mg total) by mouth daily. Take with or immediately following a meal. 90 tablet 3  . pantoprazole (PROTONIX) 40 MG tablet TAKE 1 TABLET BY MOUTH EVERY DAY (Patient taking differently: Take 40 mg by mouth daily. ) 90 tablet 3  . rosuvastatin (CRESTOR) 20 MG tablet TAKE 1 TABLET BY MOUTH EVERY DAY (Patient taking differently: Take 20 mg by mouth daily. ) 90 tablet 2   No facility-administered medications prior to visit.     ROS Review of Systems  Constitutional: Negative for diaphoresis and fatigue.  Eyes: Negative for visual disturbance.  Respiratory: Negative for cough, chest tightness, shortness of breath and wheezing.   Gastrointestinal: Negative for abdominal pain, blood in stool, constipation, diarrhea, nausea and vomiting.  Genitourinary: Negative.  Negative for difficulty urinating, hematuria and vaginal bleeding.  Musculoskeletal: Negative.   Skin: Negative.   Neurological: Negative.  Negative for dizziness.    Hematological: Negative for adenopathy. Does not bruise/bleed easily.  Psychiatric/Behavioral: Negative.     Objective:  BP 138/88 (BP Location: Left Arm, Patient Position: Sitting, Cuff Size: Large)   Pulse 62   Temp 98 F (36.7 C) (Oral)   Ht 5\' 7"  (1.702 m)   Wt 249 lb (112.9 kg)   SpO2 95%   BMI 39.00 kg/m   BP Readings from Last 3 Encounters:  02/03/18 138/88  02/02/18 114/70  12/02/17 120/80    Wt Readings from Last 3 Encounters:  02/03/18 249 lb (112.9 kg)  02/01/18 235 lb (106.6 kg)  12/02/17 247 lb 12.8 oz (112.4 kg)    Physical Exam  Constitutional: She is oriented to person, place, and time. No distress.  HENT:  Mouth/Throat: Oropharynx is clear and moist. No oropharyngeal exudate.  Eyes: Conjunctivae are normal. No scleral icterus.  Neck: Normal range of motion. Neck supple. No JVD present. No thyromegaly present.  Cardiovascular: Normal rate, regular rhythm and normal heart sounds. Exam reveals no gallop.  No murmur heard. Pulmonary/Chest: Effort normal. No respiratory distress. She has no wheezes. She has no rales.  Abdominal: Soft. Bowel sounds are normal. She exhibits no mass. There is no tenderness.  Musculoskeletal: Normal range of motion. She exhibits no edema, tenderness or deformity.  Lymphadenopathy:    She has no cervical adenopathy.  Neurological: She is alert and oriented to person, place, and time.  Skin: Skin is warm and dry. She is not diaphoretic. No pallor.  Vitals reviewed.  Lab Results  Component Value Date   WBC 3.1 (L) 02/03/2018   HGB 13.2 02/03/2018   HCT 39.0 02/03/2018   PLT 182.0 02/03/2018   GLUCOSE 85 02/01/2018   CHOL 134 02/03/2018   TRIG 52.0 02/03/2018   HDL 55.90 02/03/2018   LDLDIRECT 321.8 02/27/2012   LDLCALC 68 02/03/2018   ALT 15 02/01/2018   AST 27 02/01/2018   NA 137 02/01/2018   K 4.1 02/01/2018   CL 107 02/01/2018   CREATININE 1.09 (H) 02/01/2018   BUN 12 02/01/2018   CO2 25 02/01/2018   TSH  0.74 02/03/2018   INR 1.09 07/01/2017   HGBA1C 5.6 01/31/2017    Ct Angio Chest Pe W Or Wo Contrast  Result Date: 02/01/2018 CLINICAL DATA:  Intermittent chest and back pain. EXAM: CT ANGIOGRAPHY CHEST WITH CONTRAST TECHNIQUE: Multidetector CT imaging of the chest was performed using the standard protocol during bolus administration of intravenous contrast. Multiplanar CT image reconstructions and MIPs were obtained to evaluate the vascular anatomy. CONTRAST:  188mL ISOVUE-370 IOPAMIDOL (ISOVUE-370) INJECTION 76% COMPARISON:  CT of the chest January 17, 2017. Chest x-ray February 01, 2018. FINDINGS: Cardiovascular: Poor opacification of the thoracic aorta due to timing of contrast. The proximal portion of the aortic arch measures 4.5 cm, unchanged. No other aneurysm. Atherosclerotic changes are noted. Evaluation for dissection limited due to timing of contrast. Coronary artery calcifications. The heart is unchanged. No pulmonary emboli identified. Mediastinum/Nodes: No effusions. Small hiatal hernia. The thyroid is unremarkable. No adenopathy. Lungs/Pleura: Central airways are normal. No pneumothorax. Scattered subsegmental atelectasis. No suspicious infiltrate. Probable air trapping in the apices, stable since October 2018. No nodules or masses. Upper Abdomen: No acute abnormality. Musculoskeletal: No chest wall abnormality. No acute or significant osseous findings. Review of the MIP images confirms the above findings. IMPRESSION: 1. No pulmonary emboli identified. 2. Aneurysmal dilatation of the thoracic aorta at the anterior aspect of the arch measuring 4.5 cm Recommend semi-annual imaging followup by CTA or MRA and referral to cardiothoracic surgery if not already obtained. This recommendation follows 2010 ACCF/AHA/AATS/ACR/ASA/SCA/SCAI/SIR/STS/SVM Guidelines for the Diagnosis and Management of Patients With Thoracic Aortic Disease. Circulation. 2010; 121: [id number] 3. Coronary artery calcifications. 4.  Atherosclerotic changes in the thoracic aorta. 5. Scattered subsegmental atelectasis. Scarring and air trapping in the apices. Aortic Atherosclerosis (ICD10-I70.0). Electronically Signed   By: Dorise Bullion III M.D   On: 02/01/2018 23:51   Dg Chest Port 1 View  Result Date: 02/01/2018 CLINICAL DATA:  Chest pain EXAM: PORTABLE CHEST 1 VIEW COMPARISON:  07/01/2017, 03/23/2017 FINDINGS: Post sternotomy changes. Cardiomegaly with mild central congestion. No focal airspace disease or effusion. No pneumothorax. IMPRESSION: No active disease. Electronically Signed   By: Donavan Foil M.D.   On: 02/01/2018 18:29    Assessment & Plan:   Kiandra was seen today for hyperlipidemia and hypothyroidism.  Diagnoses and all orders for this visit:  Acquired hypothyroidism- Her TSH is in the normal range.  She will remain on the current dose of levothyroxine. -     Cancel: TSH; Future -     TSH; Future  Hyperlipidemia with target LDL less than 70- She has achieved her LDL goal is doing well on the statin. -     Cancel: Lipid panel; Future -     Lipid panel; Future  Immune thrombocytopenic purpura ([place])- Her PLT ct is normal now. -     Cancel: CBC with Differential/Platelet; Future -     CBC with Differential/Platelet; Future  I am having [name] maintain her azaTHIOprine, hydroxychloroquine, pantoprazole, rosuvastatin, metoprolol succinate, levothyroxine, and amLODipine.  No orders of the defined types were placed in this encounter.    Follow-up: Return in about 4 months (around 06/04/2018).  Scarlette Calico, MD

## 2018-02-03 NOTE — Patient Instructions (Signed)
Hypothyroidism Hypothyroidism is a disorder of the thyroid. The thyroid is a large gland that is located in the lower front of the neck. The thyroid releases hormones that control how the body works. With hypothyroidism, the thyroid does not make enough of these hormones. What are the causes? Causes of hypothyroidism may include:  Viral infections.  Pregnancy.  Your own defense system (immune system) attacking your thyroid.  Certain medicines.  Birth defects.  Past radiation treatments to your head or neck.  Past treatment with radioactive iodine.  Past surgical removal of part or all of your thyroid.  Problems with the gland that is located in the center of your brain (pituitary).  What are the signs or symptoms? Signs and symptoms of hypothyroidism may include:  Feeling as though you have no energy (lethargy).  Inability to tolerate cold.  Weight gain that is not explained by a change in diet or exercise habits.  Dry skin.  Coarse hair.  Menstrual irregularity.  Slowing of thought processes.  Constipation.  Sadness or depression.  How is this diagnosed? Your health care provider may diagnose hypothyroidism with blood tests and ultrasound tests. How is this treated? Hypothyroidism is treated with medicine that replaces the hormones that your body does not make. After you begin treatment, it may take several weeks for symptoms to go away. Follow these instructions at home:  Take medicines only as directed by your health care provider.  If you start taking any new medicines, tell your health care provider.  Keep all follow-up visits as directed by your health care provider. This is important. As your condition improves, your dosage needs may change. You will need to have blood tests regularly so that your health care provider can watch your condition. Contact a health care provider if:  Your symptoms do not get better with treatment.  You are taking thyroid  replacement medicine and: ? You sweat excessively. ? You have tremors. ? You feel anxious. ? You lose weight rapidly. ? You cannot tolerate heat. ? You have emotional swings. ? You have diarrhea. ? You feel weak. Get help right away if:  You develop chest pain.  You develop an irregular heartbeat.  You develop a rapid heartbeat. This information is not intended to replace advice given to you by your health care provider. Make sure you discuss any questions you have with your health care provider. Document Released: 03/10/2005 Document Revised: 08/16/2015 Document Reviewed: 07/26/2013 Elsevier Interactive Patient Education  2018 [employer]  

## 2018-02-04 ENCOUNTER — Encounter: Payer: Self-pay | Admitting: Internal Medicine

## 2018-02-16 ENCOUNTER — Encounter: Payer: Self-pay | Admitting: Nurse Practitioner

## 2018-02-16 ENCOUNTER — Ambulatory Visit (INDEPENDENT_AMBULATORY_CARE_PROVIDER_SITE_OTHER): Payer: BLUE CROSS/BLUE SHIELD | Admitting: Nurse Practitioner

## 2018-02-16 VITALS — BP 112/72 | HR 65 | Ht 67.0 in | Wt 246.0 lb

## 2018-02-16 DIAGNOSIS — Z9889 Other specified postprocedural states: Secondary | ICD-10-CM | POA: Diagnosis not present

## 2018-02-16 DIAGNOSIS — R0789 Other chest pain: Secondary | ICD-10-CM

## 2018-02-16 DIAGNOSIS — I10 Essential (primary) hypertension: Secondary | ICD-10-CM

## 2018-02-16 DIAGNOSIS — I712 Thoracic aortic aneurysm, without rupture, unspecified: Secondary | ICD-10-CM

## 2018-02-16 DIAGNOSIS — Z951 Presence of aortocoronary bypass graft: Secondary | ICD-10-CM

## 2018-02-16 DIAGNOSIS — I25709 Atherosclerosis of coronary artery bypass graft(s), unspecified, with unspecified angina pectoris: Secondary | ICD-10-CM

## 2018-02-16 NOTE — Patient Instructions (Addendum)
We will be checking the following labs today - NONE   Medication Instructions:    Continue with your current medicines.    If you need a refill on your cardiac medications before your next appointment, please call your pharmacy.     Testing/Procedures To Be Arranged:  N/A  Follow-Up:   See Dr. Tamala Julian in January as planned    At [facility], you and your health needs are our priority.  As part of our continuing mission to provide you with exceptional heart care, we have created designated Provider Care Teams.  These Care Teams include your primary Cardiologist (physician) and Advanced Practice Providers (APPs -  Physician Assistants and Nurse Practitioners) who all work together to provide you with the care you need, when you need it.  Special Instructions:  . CTA chest - follow up aorta - needs in May 2020                              Ascending Aortic Aneurysm/ Thoracic Aortic Aneurysm   Recent studies have raised concern that fluoroquinolone antibiotics could be associated with an increased risk of aortic aneurysm or aortic dissection. You should avoid use of Cipro and other associated antibiotics (flouroquinolone antibiotics )  It is  best to avoid activities that cause grunting or straining (medically referred to as a "valsalva maneuver"). This happens when a person bears down against a closed throat to increase the strength of arm or abdominal muscles. There's often a tendency to do this when lifting heavy weights, doing sit-ups, push-ups or chin-ups, etc., but it may be harmful.     An aneurysm is a bulge in an artery. It happens when blood pushes up against a weakened or damaged artery wall. A thoracic aortic aneurysm is an aneurysm that occurs in the first part of the aorta, between the heart and the diaphragm. The aorta is the main artery of the body. It supplies blood from the heart to the rest of the body. Some aneurysms may not cause symptoms or problems. However,  the major concern with a thoracic aortic aneurysm is that it can enlarge and burst (rupture), or blood can flow between the layers of the wall of the aorta through a tear (aorticdissection). Both of these conditions can cause bleeding inside the body and can be life-threatening if they are not diagnosed and treated right away. What are the causes? The exact cause of this condition is not known. What increases the risk? The following factors may make you more likely to develop this condition:  Being age 57 or older.  Having a hardening of the arteries caused by the buildup of fat and other substances in the lining of a blood vessel (arteriosclerosis).  Having inflammation of the walls of an artery (arteritis).  Having a genetic disease that weakens the body's connective tissue, such as Marfan syndrome.  Having an injury or trauma to the aorta.  Having an infection that is caused by bacteria, such as syphilis or staphylococcus, in the wall of the aorta (infectious aortitis).  Having high blood pressure (hypertension).  Being female.  Being white (Caucasian).  Having high cholesterol.  Having a family history of aneurysms.  Using tobacco.  Having chronic obstructive pulmonary disease (COPD). What are the signs or symptoms? Symptoms of this condition vary depending on the size and rate of growth of the aneurysm. Most grow slowly and do not cause any symptoms. When symptoms  do occur, they may include:  Pain in the chest, back, sides, or abdomen. The pain may vary in intensity. A sudden onset of severe pain may indicate that the aneurysm has ruptured.  Hoarseness.  Cough.  Shortness of breath.  Swallowing problems.  Swelling in the face, arms, or legs.  Fever.  Unexplained weight loss. How is this diagnosed? This condition may be diagnosed with:  An ultrasound.  X-rays.  A CT scan.  An MRI.  Tests to check the arteries for damage or blockages (angiogram). Most  unruptured thoracic aortic aneurysms cause no symptoms, so they are often found during exams for other conditions. How is this treated? Treatment for this condition depends on:  The size of the aneurysm.  How fast the aneurysm is growing.  Your age.  Risk factors for rupture. Aneurysms that are smaller than 2.2 inches (5.5 cm) may be managed by using medicines to control blood pressure, manage pain, or fight infection. You may need regular monitoring to see if the aneurysm is getting bigger. Your health care provider may recommend that you have an ultrasound every year or every 6 months. How often you need to have an ultrasound depends on the size of the aneurysm, how fast it is growing, and whether you have a family history of aneurysms. Surgical repair may be needed if your aneurysm is larger than 2.2 inches or if it is growing quickly. Follow these instructions at home: Eating and drinking   Eat a healthy diet. Your health care provider may recommend that you:  Lower your salt (sodium) intake. In some people, too much salt can raise blood pressure and increase the risk of thoracic aortic aneurysm.  Avoid foods that are high in saturated fat and cholesterol, such as red meat and dairy.  Eat a diet that is low in sugar.  Increase your fiber intake by including whole grains, vegetables, and fruits in your diet. Eating these foods may help to lower blood pressure.  Limit or avoid alcohol as recommended by your health care provider. Lifestyle   Follow instructions from your health care provider about healthy lifestyle habits. Your health care provider may recommend that you:  Do not use any products that contain nicotine or tobacco, such as cigarettes and e-cigarettes. If you need help quitting, ask your health care provider.  Keep your blood pressure within normal limits. The target limit for most people is below 120/80. Check your blood pressure regularly. If it is high, ask your  health care provider about ways that you can control it.  Keep your blood sugar (glucose) level and cholesterol levels within normal limits. Target limits for most people are:  Blood glucose level: Less than 100 mg/dL.  Total cholesterol level: Less than 200 mg/dL.  Maintain a healthy weight. Activity   Stay physically active and exercise regularly. Talk with your health care provider about how often you should exercise and ask which types of exercise are safe for you.  Avoid heavy lifting and activities that take a lot of effort (are strenuous). Ask your health care provider what activities are safe for you. General instructions   Keep all follow-up visits as told by your health care provider. This is important.  Talk with your health care provider about regular screenings to see if the aneurysm is getting bigger.  Take over-the-counter and prescription medicines only as told by your health care provider. Contact a health care provider if:  You have discomfort in your upper back, neck, or  abdomen.  You have trouble swallowing.  You have a cough or hoarseness.  You have a family history of aneurysms.  You have unexplained weight loss. Get help right away if:  You have sudden, severe pain in your upper back and abdomen. This pain may move into your chest and arms.  You have shortness of breath.  You have a fever. This information is not intended to replace advice given to you by your health care provider. Make sure you discuss any questions you have with your health care provider. Document Released: 03/10/2005 Document Revised: 12/21/2015 Document Reviewed: 12/21/2015 Elsevier Interactive Patient Education  2017 [employer].   Aortic Dissection An aortic dissection happens when there is a tear in the main blood vessel of the body (aorta). The aorta comes out of the heart, curves around, and then goes down the chest (thoracic aorta) and into the abdomen (abdominal aorta)  to supply arteries with blood. The wall of the aorta has inner and outer layers. Aortic dissection occurs most often in the thoracic aorta. As the tear widens and blood flows through it, the aorta becomes "double-barreled." This means that one part of the aorta continues to carry blood to the body, but blood also flows into the tear, between the layers of the aorta. The torn part of the aorta fills with blood and swells up. This can reduce blood flow through the part of the aorta that is still supplying blood to the body. Aortic dissection is a medical emergency. What are the causes? An aortic dissection is commonly caused by weakening of the artery wall due to high blood pressure. Other causes may include:  An injury, such as from a car crash.  Birth defects that affect the heart (congenital heart defects).  Thickening of the artery walls. In some cases, the cause is not known. What increases the risk? The following factors may make you more likely to develop this condition:  Having certain medical conditions, such as:  High blood pressure (hypertension).  Hardening and narrowing of the arteries (atherosclerosis).  A genetic disorder that affects the connective tissue, such as Marfan syndrome or Ehlers-Danlos syndrome.  A condition that causes inflammation of blood vessels, such as giant cell arteritis.  Having a chest injury.  Having surgery on the aorta.  Being born with a congenital heart defect.  Being female.  Being older than age 98.  Using cocaine.  Smoking.  Lifting heavy weights or doing other types of high-intensity resistance training. What are the signs or symptoms? Signs and symptoms of aortic dissection start suddenly. The most common symptoms are:  Severe chest pain that may feel like tearing, stabbing, or sharp pain.  Severe pain that spreads (radiates) to the back, neck, jaw, or abdomen. Other symptoms may include:  Trouble breathing.  Dizziness or  fainting.  Sudden weakness on one side of the body.  Nausea or vomiting.  Trouble swallowing.  Coughing up blood.  Vomiting blood.  Clammy skin. How is this diagnosed? This condition may be diagnosed based on:  Your symptoms.  A physical exam. This may include:  Listening for abnormal blood flow sounds (murmurs) in your chest or abdomen.  Checking your pulse in your arms and legs.  Checking your blood pressure to see whether it is low or whether there is a difference between the measurements in your right and left arm.  Electrocardiogram (ECG). This test measures the electrical activity in your heart.  Chest X-ray.  CT scan.  MRI.  Aortic angiogram. This test involves injecting dye to make it easier to see your blood vessels clearly.  Echocardiogram to study your heart using sound waves.  Blood tests. How is this treated? It is important to treat an aortic dissection as quickly as possible. Treatment may start as soon as your health care provider thinks that you have aortic dissection. Treatment depends on the location and severity of your dissection and your overall health. Treatment may include:  Medicines to lower your blood pressure.  Surgery to repair the dissected part of your aorta with artificial material (syntheticgraft).  A medical procedure to insert a stent-graft into the aorta (endovascular procedure). During this procedure, a long, thin tube (stent) is inserted into an artery near the groin (femoral artery) and moved up to the damaged part of the aorta. Then, the stent is opened to help improve blood flow and prevent future dissection. Follow these instructions at home: Activity   Avoid activities that could injure your chest or your abdomen. Ask your health care provider what activities are safe for you.  After you have recovered, try to stay active. Ask your health care provider what activities are safe for you after recovery.  Do not lift  anything that is heavier than 10 lb (4.5 kg) until your health care provider approves.  Do not drive or use heavy machinery while taking prescription pain medicine. Eating and drinking   Eat a heart-healthy diet, which includes lots of fresh fruits and vegetables, low-fat (lean) protein, and whole grains.  Check ingredients and nutrition facts on packaged foods and beverages, and avoid foods with high amounts of:  Salt (sodium).  Saturated fats (like red meat).  Trans fats (like fried food). General instructions   Take over-the-counter and prescription medicines only as told by your health care provider.  Work with your health care provider to manage your blood pressure.  Talk with your health care provider about how to manage stress.  Do not use any products that contain nicotine or tobacco, such as cigarettes and e-cigarettes. If you need help quitting, ask your health care provider.  Keep all follow-up visits as told by your health care provider. This is important. Get help right away if:  You develop any symptoms of aortic dissection after treatment, including severe pain in your chest, back, or abdomen.  You have a pain in your abdomen.  You have trouble breathing or you develop a cough.  You faint.  You develop a racing heartbeat. These symptoms may represent a serious problem that is an emergency. Do not wait to see if the symptoms will go away. Get medical help right away. Call your local emergency services (911 in the [place] Do not drive yourself to the hospital. Summary  An aortic dissection happens when there is a tear in the main blood vessel of the body (aorta). It is a medical emergency.  The most common symptom is severe pain in the chest that spreads (radiates) to the back, neck, jaw, or abdomen.  It is important to treat an aortic dissection as quickly as possible. Treatment typically includes surgery and medicines. This information is not intended to  replace advice given to you by your health care provider. Make sure you discuss any questions you have with your health care provider. Document Released: 06/17/2007 Document Revised: 01/28/2016 Document Reviewed: 01/28/2016 Elsevier Interactive Patient Education  2017 [employer].    Call the [place] office at [phone] if you have any  questions, problems or concerns.

## 2018-02-16 NOTE — Progress Notes (Signed)
CARDIOLOGY OFFICE NOTE  Date:  02/16/2018    [name] Date of Birth: [birth year] Medical Record #[id number]  PCP:  Janith Lima, MD  Cardiologist:  Tamala Julian    Chief Complaint  Patient presents with  . Follow-up    Post hospital visit - seen for Dr Tamala Julian    History of Present Illness: [name] is a 62 y.o. female who presents today for a post hospital visit. Seen for Dr. Tamala Julian.   She has a history of CAD s/p remote stents/angioplasty then CABG 01/2017 with mitral valve annuloplasty, post-op atrial fib, lupus followed by rheumatology, CKD stage II, ITP s/p splenectomy, GERD, & hypothyroidism.   Last seen here in September by Dr. Tamala Julian. BP meds were adjusted at that time.   Presented to the hospital earlier this month with chest pain - cardiology saw. Negative work up. Norvasc was added. She did have a CT to rule out PE and this showed an enlarged thoracic aorta.   Comes in today. Here alone. She notes she is feeling much better. No further chest pain. BP is good. Breathing is fine. She has no real concerns. Did have questions about her CT scan. BP is good at home and good here today.   Past Medical History:  Diagnosis Date  . CAD (coronary artery disease)    a. remote stents/angioplasty. b. s/p CABG 01/2017 with mitral valve annuloplasty.  . Esophageal reflux   . GERD (gastroesophageal reflux disease)   . Hypothyroidism   . Immune thrombocytopenic purpura ([place])   . Lupus erythematosus   . Mitral valve insufficiency and aortic valve insufficiency   . Myocardial infarction ([place])    x 2  . PONV (postoperative nausea and vomiting)   . Postoperative atrial fibrillation ([place])   . Severe mitral regurgitation    a. s/p MV annuloplasty 01/2017 at time of CABG.  . Status post mitral valve annuloplasty   . Unspecified disease of pericardium     Past Surgical History:  Procedure Laterality Date  . CHOLECYSTECTOMY, LAPAROSCOPIC  2010  . CORONARY ARTERY  BYPASS GRAFT N/A 01/30/2017   Procedure: CORONARY ARTERY BYPASS GRAFTING (CABG), Times four  using the right saphaneous vein, harvested endoscopicly.  and left internal mammary artery .;  Surgeon: Gaye Pollack, MD;  Location: [facility] OR;  Service: Open Heart Surgery;  Laterality: N/A;  . LEFT HEART CATH AND CORONARY ANGIOGRAPHY N/A 01/23/2017   Procedure: LEFT HEART CATH AND CORONARY ANGIOGRAPHY;  Surgeon: Belva Crome, MD;  Location: [place] CV LAB;  Service: Cardiovascular;  Laterality: N/A;  . MITRAL VALVE REPAIR N/A 01/30/2017   Procedure: MITRAL VALVE REPAIR (MVR);  Surgeon: Gaye Pollack, MD;  Location: [place];  Service: Open Heart Surgery;  Laterality: N/A;  . plastic surgical repair      dog bite to leg  . SPLENECTOMY  5-04  . TEE WITHOUT CARDIOVERSION N/A 01/27/2017   Procedure: TRANSESOPHAGEAL ECHOCARDIOGRAM (TEE);  Surgeon: Sanda Klein, MD;  Location: [facility] ENDOSCOPY;  Service: Cardiovascular;  Laterality: N/A;  . TEE WITHOUT CARDIOVERSION N/A 01/30/2017   Procedure: TRANSESOPHAGEAL ECHOCARDIOGRAM (TEE);  Surgeon: Gaye Pollack, MD;  Location: [place];  Service: Open Heart Surgery;  Laterality: N/A;  . ULTRASOUND GUIDANCE FOR VASCULAR ACCESS  01/23/2017   Procedure: Ultrasound Guidance For Vascular Access;  Surgeon: Belva Crome, MD;  Location: [place] CV LAB;  Service: Cardiovascular;;     Medications: Current Meds  Medication Sig  . amLODipine (  NORVASC) 5 MG tablet Take 1 tablet (5 mg total) by mouth daily.  Marland Kitchen azaTHIOprine (IMURAN) 50 MG tablet Take 50 mg by mouth 2 (two) times daily.  . hydroxychloroquine (PLAQUENIL) 200 MG tablet Take 200 mg by mouth daily.   Marland Kitchen levothyroxine (SYNTHROID, LEVOTHROID) 75 MCG tablet Take 1 tablet (75 mcg total) by mouth daily.  . metoprolol succinate (TOPROL-XL) 50 MG 24 hr tablet Take 1 tablet (50 mg total) by mouth daily. Take with or immediately following a meal.  . pantoprazole (PROTONIX) 40 MG tablet TAKE 1 TABLET BY MOUTH EVERY DAY  (Patient taking differently: Take 40 mg by mouth daily. )  . rosuvastatin (CRESTOR) 20 MG tablet TAKE 1 TABLET BY MOUTH EVERY DAY (Patient taking differently: Take 20 mg by mouth daily. )     Allergies: No Known Allergies  Social History: The patient  reports that she quit smoking about 6 years ago. Her smoking use included cigarettes. She has a 15.00 pack-year smoking history. She has never used smokeless tobacco. She reports that she does not drink alcohol or use drugs.   Family History: The patient's family history includes Cancer in her mother and paternal grandfather; Fibromyalgia in her mother; GER disease in her father; Heart attack in her maternal grandmother; Heart disease in her father; Hypertension in her father; Paget's disease of bone in her mother.   Review of Systems: Please see the history of present illness.   Otherwise, the review of systems is positive for none.   All other systems are reviewed and negative.   Physical Exam: VS:  BP 112/72 (BP Location: Left Arm, Patient Position: Sitting, Cuff Size: Large)   Pulse 65   Ht 5\' 7"  (1.702 m)   Wt 246 lb (111.6 kg)   SpO2 97% Comment: at rest  BMI 38.53 kg/m  .  BMI Body mass index is 38.53 kg/m.  Wt Readings from Last 3 Encounters:  02/16/18 246 lb (111.6 kg)  02/03/18 249 lb (112.9 kg)  02/01/18 235 lb (106.6 kg)    General: Alert. Obese. She is in no acute distress.   HEENT: Normal.  Neck: Supple, no JVD, carotid bruits, or masses noted.  Cardiac: Regular rate and rhythm. No murmurs, rubs, or gallops. No edema.  Respiratory:  Lungs are clear to auscultation bilaterally with normal work of breathing.  GI: Soft and nontender.  MS: No deformity or atrophy. Gait and ROM intact.  Skin: Warm and dry. Color is normal.  Neuro:  Strength and sensation are intact and no gross focal deficits noted.  Psych: Alert, appropriate and with normal affect.   LABORATORY DATA:  EKG:  EKG is not ordered today.  Lab  Results  Component Value Date   WBC 3.1 (L) 02/03/2018   HGB 13.2 02/03/2018   HCT 39.0 02/03/2018   PLT 182.0 02/03/2018   GLUCOSE 85 02/01/2018   CHOL 134 02/03/2018   TRIG 52.0 02/03/2018   HDL 55.90 02/03/2018   LDLDIRECT 321.8 02/27/2012   LDLCALC 68 02/03/2018   ALT 15 02/01/2018   AST 27 02/01/2018   NA 137 02/01/2018   K 4.1 02/01/2018   CL 107 02/01/2018   CREATININE 1.09 (H) 02/01/2018   BUN 12 02/01/2018   CO2 25 02/01/2018   TSH 0.74 02/03/2018   INR 1.09 07/01/2017   HGBA1C 5.6 01/31/2017     BNP (last 3 results) No results for input(s): BNP in the last 8760 hours.  ProBNP (last 3 results) No results for input(s):  PROBNP in the last 8760 hours.   Other Studies Reviewed Today:  CTA CHEST IMPRESSION 01/2018: 1. No pulmonary emboli identified. 2. Aneurysmal dilatation of the thoracic aorta at the anterior aspect of the arch measuring 4.5 cm Recommend semi-annual imaging followup by CTA or MRA and referral to cardiothoracic surgery if not already obtained. This recommendation follows 2010 ACCF/AHA/AATS/ACR/ASA/SCA/SCAI/SIR/STS/SVM Guidelines for the Diagnosis and Management of Patients With Thoracic Aortic Disease. Circulation. 2010; 121: e266-e36 3. Coronary artery calcifications. 4. Atherosclerotic changes in the thoracic aorta. 5. Scattered subsegmental atelectasis. Scarring and air trapping in the apices.  Aortic Atherosclerosis (ICD10-I70.0).   Electronically Signed   By: Dorise Bullion III M.D   On: 02/01/2018 23:51    Echo TEE (OR) Conclusions 01/2017  Result status: Final result   Mitral valve: Annulus not dilated. MR appears to be secondary to leaflet tethering in the setting of posterior displacement of the posterior medial papillary muscle Mild leaflet thickening is present. Mild leaflet calcification is present. Mild mitral annular calcification. Severe regurgitation.  Pulmonic valve: Trace regurgitation.  Right ventricle:  Normal wall thickness. Cavity is mildly dilated. Mildly reduced systolic function.  Aortic valve: The valve is trileaflet. Mild valve thickening present. Mild valve calcification present. No stenosis. Trace regurgitation.  Tricuspid valve: Mild regurgitation. The tricuspid valve regurgitation jet is central.  Aorta: The ascending aorta is dilated.     Assessment/Plan:  1. Recent admission for atypical chest pain - improved with BP control - no recurrence.   2. History of PAF - remains in NSR  3. Prior ITP s/p splenectomy - platelet count was repeated and was stable.   4. Thoracic aneurysm - precautions given. Repeat scan in 6 months.   5. HTN - BP looks better.   6. CAD - one year out now from CABG - recent admission for atypical chest pain - this has resolved. Would follow for now. Needs CV risk factor modification.   Current medicines are reviewed with the patient today.  The patient does not have concerns regarding medicines other than what has been noted above.  The following changes have been made:  See above.  Labs/ tests ordered today include:    Orders Placed This Encounter  Procedures  . CT ANGIO CHEST AORTA W &/OR WO CONTRAST     Disposition:   FU with Dr. Tamala Julian as planned in January. Follow up CTA has been scheduled for May with lab one week prior.   Patient is agreeable to this plan and will call if any problems develop in the interim.   SignedTruitt Merle, NP  02/16/2018 10:39 AM  [facility] [address] [place], [place]  [address] Phone: [phone] Fax: [phone]

## 2018-02-22 ENCOUNTER — Telehealth (INDEPENDENT_AMBULATORY_CARE_PROVIDER_SITE_OTHER): Payer: Self-pay | Admitting: Physical Medicine and Rehabilitation

## 2018-02-22 NOTE — Telephone Encounter (Signed)
She has not had f/up with Dr. Paulla Fore sine we last saw and has had CABG and MVR since then, I would suggest she follow up with him first. No need to mention the series of three thing to her. I guess if no new trauma and she tells you it is exactly same side and the last injection lasted since 11/2016 then maybe

## 2018-02-22 NOTE — Telephone Encounter (Signed)
Patient has an appointment with Dr. Paulla Fore on 12/4.

## 2018-02-24 ENCOUNTER — Encounter: Payer: Self-pay | Admitting: Sports Medicine

## 2018-02-24 ENCOUNTER — Ambulatory Visit (INDEPENDENT_AMBULATORY_CARE_PROVIDER_SITE_OTHER): Payer: BLUE CROSS/BLUE SHIELD | Admitting: Sports Medicine

## 2018-02-24 VITALS — BP 110/82 | HR 63 | Ht 67.0 in | Wt 251.6 lb

## 2018-02-24 DIAGNOSIS — G8929 Other chronic pain: Secondary | ICD-10-CM

## 2018-02-24 DIAGNOSIS — I25709 Atherosclerosis of coronary artery bypass graft(s), unspecified, with unspecified angina pectoris: Secondary | ICD-10-CM

## 2018-02-24 DIAGNOSIS — M519 Unspecified thoracic, thoracolumbar and lumbosacral intervertebral disc disorder: Secondary | ICD-10-CM

## 2018-02-24 DIAGNOSIS — M4726 Other spondylosis with radiculopathy, lumbar region: Secondary | ICD-10-CM

## 2018-02-24 DIAGNOSIS — M5432 Sciatica, left side: Secondary | ICD-10-CM

## 2018-02-24 DIAGNOSIS — M3214 Glomerular disease in systemic lupus erythematosus: Secondary | ICD-10-CM

## 2018-02-24 DIAGNOSIS — M5442 Lumbago with sciatica, left side: Secondary | ICD-10-CM

## 2018-02-24 NOTE — Patient Instructions (Addendum)
Also check out "Foundation Training" which is a program developed by Dr. Eric Goodman.   There are links to a couple of his YouTube Videos below and I would like to see performing one of his videos 5-6 days per week.    A good intro video is: "Independence from Pain 7-minute Video" - https://www.youtube.com/watch?[email or web address]   Exercises that focus more on the neck are as below: Dr. Goodman with [employer] teaching neck and shoulder details Part 1 - https://youtu.be/[email or web address] Part 2 Dr. Goodman with [employer] quick routine to practice daily - https://youtu.be/[email or web address]  Do not try to attempt the entire video when first beginning.    Try breaking of each exercise that he goes into shorter segments.  Otherwise if they perform an exercise for 45 seconds, start with 15 seconds and rest and then resume when they begin the new activity.  If you work your way up to being able to do these videos without having to stop, I expect you will see significant improvements in your pain.  If you enjoy his videos and would like to find out more you can look on his website: [email or web address].  He has a workout streaming option as well as a DVD set available for purchase.  Amazon has the best price for his DVDs.    

## 2018-02-24 NOTE — Progress Notes (Signed)
[name]. [place], [place] [facility] [place]  Ladawna Walgren - 62 y.o. female MRN [id number]  Date of birth: [birth year]  Visit Date: 02/24/2018  PCP: Janith Lima, MD   Referred by: Janith Lima, MD   SUBJECTIVE:  Devri Adams is here for f/u LBP (Needs referral to Dr. Ernestina Patches for Epidural inj. Worsening back pain. Responded well to epidural in the past. Has tried Tylenol, Naproxen, Gabapentin (d/c), and heat. Radiates to the LLE. )   HPI: Patient presents for reevaluation of her ongoing back pain.  She has had issues with this for quite some time but had been doing well until recently.  Symptoms have worsened and now radiates into the left lower extremity.  She reports this is similar as it was last year and this initially responded well to an epidural steroid injection  REVIEW OF SYSTEMS: She denies any fevers or chills.  No recent weight gain or weight loss. History of lupus.   HISTORY:  Prior history reviewed and updated per electronic medical record.  Social History   Occupational History  . Not on file  Tobacco Use  . Smoking status: Former Smoker    Packs/day: 0.50    Years: 30.00    Pack years: 15.00    Types: Cigarettes    Last attempt to quit: 12/23/2011    Years since quitting: 6.3  . Smokeless tobacco: Never Used  Substance and Sexual Activity  . Alcohol use: No  . Drug use: No  . Sexual activity: Not Currently    Comment: 1st intercourse 62 yo-More than 5 partners   Social History   Social History Narrative   HSG,  graduated from [employer] in [place]. In college [name] -grad '13 with relgious studies. [name] - Fall '13 for MA-divinity. Marrried '73 - 2 years/ divorced. 2 son- '73, '75 - CP.    Occupation: full-time Ship broker. She lives alone with her younger son living with her part-time but he resides in a managed care facility.       OBJECTIVE:  VS:  HT:5\' 7"  (170.2  cm)   WT:251 lb 9.6 oz (114.1 kg)  BMI:39.4    BP:110/82  HR:63bpm  TEMP: ( )  RESP:95 %   PHYSICAL EXAM: CONSTITUTIONAL: Well-developed, Well-nourished and In no acute distress PSYCHIATRIC: Alert & appropriately interactive. and Not depressed or anxious appearing. RESPIRATORY: No increased work of breathing and Trachea Midline EYES: Pupils are equal., EOM intact without nystagmus. and No scleral icterus.  VASCULAR EXAM: Warm and well perfused NEURO: unremarkable  MSK Exam: Patient has a mildly positive straight leg raise worse on the left than the right.  She has bilateral paraspinal muscle tenderness. She is able to heel and toe walk.  ASSESSMENT   1. Chronic left-sided low back pain with left-sided sciatica   2. Systemic lupus erythematosus with focal and segmental proliferative glomerulonephritis ([place])   3. Intervertebral disc disorder   4. Osteoarthritis of spine with radiculopathy, lumbar region   5. Sciatic pain, left     PLAN:  Pertinent additional documentation may be included in corresponding procedure notes, imaging studies, problem based documentation and patient instructions.  Procedures:  . None  Medications:  No orders of the defined types were placed in this encounter.  Discussion/Instructions: No problem-specific Assessment & Plan notes found for this encounter.  . Symptoms are almost identical to what they were last year where she had great response  with epidural steroid injection.  Referral back to Dr. Ernestina Patches placed today.  Discussed the importance of continuing home therapeutic exercise program. . If any lack of improvement can consider repeat diagnostic imaging. . Links to [employer] provided today per Patient Instructions.  These exercises were developed by Minerva Ends, DC with a strong emphasis on core neuromuscular reducation and postural realignment through body-weight exercises. . Discussed red flag symptoms that warrant earlier  emergent evaluation and patient voices understanding. . Activity modifications and the importance of avoiding exacerbating activities (limiting pain to no more than a 4 / 10 during or following activity) recommended and discussed.  Follow-up:  . Return in about 8 weeks (around 04/21/2018).          Gerda Diss, [facility] Physician

## 2018-03-01 ENCOUNTER — Other Ambulatory Visit: Payer: Self-pay | Admitting: Interventional Cardiology

## 2018-03-02 ENCOUNTER — Other Ambulatory Visit: Payer: Self-pay

## 2018-03-02 MED ORDER — AMLODIPINE BESYLATE 5 MG PO TABS: 5.0000 mg | ORAL_TABLET | Freq: Every day | ORAL | 0 refills | Status: DC

## 2018-03-02 MED ORDER — METOPROLOL SUCCINATE ER 50 MG PO TB24: 50.0000 mg | ORAL_TABLET | Freq: Every day | ORAL | 3 refills | Status: DC

## 2018-03-12 ENCOUNTER — Encounter: Payer: Self-pay | Admitting: Interventional Cardiology

## 2018-03-26 ENCOUNTER — Ambulatory Visit (INDEPENDENT_AMBULATORY_CARE_PROVIDER_SITE_OTHER): Payer: BLUE CROSS/BLUE SHIELD | Admitting: Physical Medicine and Rehabilitation

## 2018-03-26 ENCOUNTER — Encounter (INDEPENDENT_AMBULATORY_CARE_PROVIDER_SITE_OTHER): Payer: Self-pay | Admitting: Physical Medicine and Rehabilitation

## 2018-03-26 ENCOUNTER — Ambulatory Visit (INDEPENDENT_AMBULATORY_CARE_PROVIDER_SITE_OTHER): Payer: Self-pay

## 2018-03-26 VITALS — BP 130/78 | HR 58 | Temp 98.3°F

## 2018-03-26 DIAGNOSIS — M5416 Radiculopathy, lumbar region: Secondary | ICD-10-CM | POA: Diagnosis not present

## 2018-03-26 DIAGNOSIS — M5116 Intervertebral disc disorders with radiculopathy, lumbar region: Secondary | ICD-10-CM | POA: Diagnosis not present

## 2018-03-26 MED ORDER — BETAMETHASONE SOD PHOS & ACET 6 (3-3) MG/ML IJ SUSP
12.0000 mg | Freq: Once | INTRAMUSCULAR | Status: AC
  Administered 2018-03-26: 12 mg

## 2018-03-26 NOTE — Progress Notes (Signed)
 .  Numeric Pain Rating Scale and Functional Assessment Average Pain 8   In the last MONTH (on 0-10 scale) has pain interfered with the following?  1. General activity like being  able to carry out your everyday physical activities such as walking, climbing stairs, carrying groceries, or moving a chair?  Rating(8)   +Driver, -BT, -Dye Allergies.  

## 2018-03-26 NOTE — Patient Instructions (Signed)

## 2018-04-01 NOTE — Procedures (Signed)
S1 Lumbosacral Transforaminal Epidural Steroid Injection - Sub-Pedicular Approach with Fluoroscopic Guidance   Patient: [name]      Date of Birth: 1956/01/26 MRN: [id number] PCP: Janith Lima, MD      Visit Date: 03/26/2018   Universal Protocol:    Date/Time: 01/09/205:39 AM  Consent Given By: the patient  Position:  PRONE  Additional Comments: Vital signs were monitored before and after the procedure. Patient was prepped and draped in the usual sterile fashion. The correct patient, procedure, and site was verified.   Injection Procedure Details:  Procedure Site One Meds Administered:  Meds ordered this encounter  Medications  . betamethasone acetate-betamethasone sodium phosphate (CELESTONE) injection 12 mg    Laterality: Bilateral  Location/Site:  S1 Foramen   Needle size: 22 ga.  Needle type: Spinal  Needle Placement: Transforaminal  Findings:   -Comments: Excellent flow of contrast along the nerve and into the epidural space.  Procedure Details: After squaring off the sacral end-plate to get a true AP view, the C-arm was positioned so that the best possible view of the S1 foramen was visualized. The soft tissues overlying this structure were infiltrated with 2-3 ml. of 1% Lidocaine without Epinephrine.    The spinal needle was inserted toward the target using a "trajectory" view along the fluoroscope beam.  Under AP and lateral visualization, the needle was advanced so it did not puncture dura. Biplanar projections were used to confirm position. Aspiration was confirmed to be negative for CSF and/or blood. A 1-2 ml. volume of Isovue-250 was injected and flow of contrast was noted at each level. Radiographs were obtained for documentation purposes.   After attaining the desired flow of contrast documented above, a 0.5 to 1.0 ml test dose of 0.25% Marcaine was injected into each respective transforaminal space.  The patient was observed for 90 seconds  post injection.  After no sensory deficits were reported, and normal lower extremity motor function was noted,   the above injectate was administered so that equal amounts of the injectate were placed at each foramen (level) into the transforaminal epidural space.   Additional Comments:  The patient tolerated the procedure well Dressing: Band-Aid    Post-procedure details: Patient was observed during the procedure. Post-procedure instructions were reviewed.  Patient left the clinic in stable condition.

## 2018-04-01 NOTE — Progress Notes (Signed)
[name] - 63 y.o. female MRN [id number]  Date of birth: [birth year]  Office Visit Note: Visit Date: 03/26/2018 PCP: Janith Lima, MD Referred by: Janith Lima, MD  Subjective: No chief complaint on file.  HPI:  [name] is a 63 y.o. female who comes in today For planned left S1 transforaminal epidural steroid injection which would been a repeat from injection be performed in September 2018.  Unfortunately since I have seen her she is actually had coronary artery bypass grafting as well as mitral valve repair.  We had her follow-up with Dr. Teresa Coombs who she had initially seen because she had these issues medically since I have seen her and I really only saw her for the injection.  Dr. Teresa Coombs did see her and really did not comment on any reason not to do the injection in terms of cardiac problems and evaluation.  He commented on the left-sided radicular pain once again.  Today she is describing bilateral radicular pain still more left than right.  But clearly bilateral.  Brief exam shows no focal weakness.  We will go ahead and complete bilateral S1 transforaminal injection would consider repeat MRI at some point if symptoms were bilateral and she will continue to follow with Teresa Coombs, DO.  ROS Otherwise per HPI.  Assessment & Plan: Visit Diagnoses:  1. Lumbar radiculopathy   2. Radiculopathy due to lumbar intervertebral disc disorder     Plan: No additional findings.   Meds & Orders:  Meds ordered this encounter  Medications  . betamethasone acetate-betamethasone sodium phosphate (CELESTONE) injection 12 mg    Orders Placed This Encounter  Procedures  . XR C-ARM NO REPORT  . Epidural Steroid injection    Follow-up: Return if symptoms worsen or fail to improve, for Consider L4-5 facet injection vs MRI of lumbar spine.   Procedures: No procedures performed  S1 Lumbosacral Transforaminal Epidural Steroid Injection - Sub-Pedicular Approach  with Fluoroscopic Guidance   Patient: [name]      Date of Birth: 05-Nov-1955 MRN: [id number] PCP: Janith Lima, MD      Visit Date: 03/26/2018   Universal Protocol:    Date/Time: 01/09/205:39 AM  Consent Given By: the patient  Position:  PRONE  Additional Comments: Vital signs were monitored before and after the procedure. Patient was prepped and draped in the usual sterile fashion. The correct patient, procedure, and site was verified.   Injection Procedure Details:  Procedure Site One Meds Administered:  Meds ordered this encounter  Medications  . betamethasone acetate-betamethasone sodium phosphate (CELESTONE) injection 12 mg    Laterality: Bilateral  Location/Site:  S1 Foramen   Needle size: 22 ga.  Needle type: Spinal  Needle Placement: Transforaminal  Findings:   -Comments: Excellent flow of contrast along the nerve and into the epidural space.  Procedure Details: After squaring off the sacral end-plate to get a true AP view, the C-arm was positioned so that the best possible view of the S1 foramen was visualized. The soft tissues overlying this structure were infiltrated with 2-3 ml. of 1% Lidocaine without Epinephrine.    The spinal needle was inserted toward the target using a "trajectory" view along the fluoroscope beam.  Under AP and lateral visualization, the needle was advanced so it did not puncture dura. Biplanar projections were used to confirm position. Aspiration was confirmed to be negative for CSF and/or blood. A 1-2 ml. volume of Isovue-250 was injected and flow of contrast  was noted at each level. Radiographs were obtained for documentation purposes.   After attaining the desired flow of contrast documented above, a 0.5 to 1.0 ml test dose of 0.25% Marcaine was injected into each respective transforaminal space.  The patient was observed for 90 seconds post injection.  After no sensory deficits were reported, and normal lower  extremity motor function was noted,   the above injectate was administered so that equal amounts of the injectate were placed at each foramen (level) into the transforaminal epidural space.   Additional Comments:  The patient tolerated the procedure well Dressing: Band-Aid    Post-procedure details: Patient was observed during the procedure. Post-procedure instructions were reviewed.  Patient left the clinic in stable condition.    Clinical History: MRI LUMBAR SPINE WITHOUT CONTRAST  TECHNIQUE: Multiplanar, multisequence MR imaging of the lumbar spine was performed. No intravenous contrast was administered.  COMPARISON:  CT abdomen and pelvis also 17/2017.  FINDINGS: Segmentation:  Standard.  Alignment:  Normal.  Vertebrae:  No fracture or worrisome lesion.  Conus medullaris: Extends to the L1 level and appears normal.  Paraspinal and other soft tissues: Negative.  Disc levels:  T11-12:  Minimal disc bulge without stenosis.  T12-L1:  Negative.  L1-2:  Negative.  L2-3:  Negative.  L3-4:  Negative.  L4-5: There is bilateral facet degenerative disease with associated effusions. Shallow disc bulge without central canal or foraminal stenosis is present.  L5-S1: Focal left subarticular recess protrusion impinges on the descending left S1 root. The foramina are open. Moderate facet degenerative change is seen.  IMPRESSION: Focal disc protrusion in the left subarticular recess at L5-S1 impinges on the left S1 root.  Moderate facet degenerative disease L4-5 with associated facet joint effusions.   Electronically Signed   By: Inge Rise M.D.   On: 08/31/2016 14:40     Objective:  VS:  HT:    WT:   BMI:     BP:130/78  HR:(!) 58bpm  TEMP:98.3 F (36.8 C)( )  RESP:  Physical Exam  Ortho Exam Imaging: No results found.

## 2018-04-06 NOTE — Progress Notes (Signed)
Cardiology Office Note:    Date:  04/07/2018   ID:  [name], DOB 02/05/1956, MRN [id number]  PCP:  Janith Lima, MD  Cardiologist:  Sinclair Grooms, MD   Referring MD: Janith Lima, MD   Chief Complaint  Patient presents with  . Coronary Artery Disease  . Thoracic Aortic Aneurysm    History of Present Illness:    [name] is a 63 y.o. female with a hx of lupus erythematosus, coronary artery disease with prior history of stenting and ultimate recent four-vessel coronary bypass grafting 2018, mitral valve annuloplasty at the time of CABG in 2018, postoperative atrial fibrillation, and PAD.  Sending aortic aneurysm.  She was seen in the emergency room in November because of headaches, vague chest pain, and concerned about significant elevation in blood pressure.  A CT angiogram of the chest was performed to rule out aortic dissection.  No dissection was identified but a thoracic aneurysm measuring 4.5 cm was identified.  A CT scan done 1 year earlier did not mention the presence of an aneurysm but simply stated the presence of "aortic ectasia".  Amlodipine 5 mg/day was started at the emergency visit.  Blood pressure has been much better controlled.  Headaches have resolved.  She has had no recurrence of chest discomfort.  She feels well.   Past Medical History:  Diagnosis Date  . CAD (coronary artery disease)    a. remote stents/angioplasty. b. s/p CABG 01/2017 with mitral valve annuloplasty.  . Esophageal reflux   . GERD (gastroesophageal reflux disease)   . Hypothyroidism   . Immune thrombocytopenic purpura ([place])   . Lupus erythematosus   . Mitral valve insufficiency and aortic valve insufficiency   . Myocardial infarction ([place])    x 2  . PONV (postoperative nausea and vomiting)   . Postoperative atrial fibrillation ([place])   . Severe mitral regurgitation    a. s/p MV annuloplasty 01/2017 at time of CABG.  . Status post mitral valve annuloplasty     . Unspecified disease of pericardium     Past Surgical History:  Procedure Laterality Date  . CHOLECYSTECTOMY, LAPAROSCOPIC  2010  . CORONARY ARTERY BYPASS GRAFT N/A 01/30/2017   Procedure: CORONARY ARTERY BYPASS GRAFTING (CABG), Times four  using the right saphaneous vein, harvested endoscopicly.  and left internal mammary artery .;  Surgeon: Gaye Pollack, MD;  Location: [facility];  Service: Open Heart Surgery;  Laterality: N/A;  . LEFT HEART CATH AND CORONARY ANGIOGRAPHY N/A 01/23/2017   Procedure: LEFT HEART CATH AND CORONARY ANGIOGRAPHY;  Surgeon: Belva Crome, MD;  Location: [facility];  Service: Cardiovascular;  Laterality: N/A;  . MITRAL VALVE REPAIR N/A 01/30/2017   Procedure: MITRAL VALVE REPAIR (MVR);  Surgeon: Gaye Pollack, MD;  Location: [facility];  Service: Open Heart Surgery;  Laterality: N/A;  . plastic surgical repair      dog bite to leg  . SPLENECTOMY  5-04  . TEE WITHOUT CARDIOVERSION N/A 01/27/2017   Procedure: TRANSESOPHAGEAL ECHOCARDIOGRAM (TEE);  Surgeon: Sanda Klein, MD;  Location: [facility];  Service: Cardiovascular;  Laterality: N/A;  . TEE WITHOUT CARDIOVERSION N/A 01/30/2017   Procedure: TRANSESOPHAGEAL ECHOCARDIOGRAM (TEE);  Surgeon: Gaye Pollack, MD;  Location: [facility];  Service: Open Heart Surgery;  Laterality: N/A;  . ULTRASOUND GUIDANCE FOR VASCULAR ACCESS  01/23/2017   Procedure: Ultrasound Guidance For Vascular Access;  Surgeon: Belva Crome, MD;  Location: [facility];  Service: Cardiovascular;;    Current Medications: Current Meds  Medication Sig  . amLODipine (NORVASC) 5 MG tablet TAKE 1 TABLET BY MOUTH EVERY DAY  . azaTHIOprine (IMURAN) 50 MG tablet Take 50 mg by mouth 2 (two) times daily.  . hydroxychloroquine (PLAQUENIL) 200 MG tablet Take 200 mg by mouth daily.   Marland Kitchen levothyroxine (SYNTHROID, LEVOTHROID) 75 MCG tablet Take 1 tablet (75 mcg total) by mouth daily.  . metoprolol succinate (TOPROL-XL) 50 MG 24 hr tablet Take 1  tablet (50 mg total) by mouth daily. Take with or immediately following a meal.  . pantoprazole (PROTONIX) 40 MG tablet Take 40 mg by mouth daily.  . rosuvastatin (CRESTOR) 20 MG tablet Take 20 mg by mouth daily.  Marland Kitchen tobramycin (TOBREX) 0.3 % ophthalmic solution Place 1 drop into the right eye every 4 (four) hours. For 10 days     Allergies:   Patient has no known allergies.   Social History   Socioeconomic History  . Marital status: Single    Spouse name: Not on file  . Number of children: Not on file  . Years of education: Not on file  . Highest education level: Not on file  Occupational History  . Not on file  Social Needs  . Financial resource strain: Not on file  . Food insecurity:    Worry: Not on file    Inability: Not on file  . Transportation needs:    Medical: Not on file    Non-medical: Not on file  Tobacco Use  . Smoking status: Former Smoker    Packs/day: 0.50    Years: 30.00    Pack years: 15.00    Types: Cigarettes    Last attempt to quit: 12/23/2011    Years since quitting: 6.2  . Smokeless tobacco: Never Used  Substance and Sexual Activity  . Alcohol use: No  . Drug use: No  . Sexual activity: Not Currently    Comment: 1st intercourse 63 yo-More than 5 partners  Lifestyle  . Physical activity:    Days per week: Not on file    Minutes per session: Not on file  . Stress: Not on file  Relationships  . Social connections:    Talks on phone: Not on file    Gets together: Not on file    Attends religious service: Not on file    Active member of club or organization: Not on file    Attends meetings of clubs or organizations: Not on file    Relationship status: Not on file  Other Topics Concern  . Not on file  Social History Narrative   HSG,  graduated from [employer] in [place]. In college [name] -grad '13 with relgious studies. [employer] - Fall '13 for MA-divinity. Marrried '73 - 2 years/ divorced. 2 son- '73, '75 - CP.    Occupation:  full-time Ship broker. She lives alone with her younger son living with her part-time but he resides in a managed care facility.      Family History: The patient's family history includes Cancer in her mother and paternal grandfather; Fibromyalgia in her mother; GER disease in her father; Heart attack in her maternal grandmother; Heart disease in her father; Hypertension in her father; Paget's disease of bone in her mother.  ROS:   Please see the history of present illness.    No other complaints.  All other systems reviewed and are negative.  EKGs/Labs/Other Studies Reviewed:    The following studies were  reviewed today:  Chest CT scan 02/01/2018: IMPRESSION: 1. No pulmonary emboli identified. 2. Aneurysmal dilatation of the thoracic aorta at the anterior aspect of the arch measuring 4.5 cm Recommend semi-annual imaging followup by CTA or MRA and referral to cardiothoracic surgery if not already obtained. This recommendation follows 2010 ACCF/AHA/AATS/ACR/ASA/SCA/SCAI/SIR/STS/SVM Guidelines for the Diagnosis and Management of Patients With Thoracic Aortic Disease. Circulation. 2010; 121: e266-e36 3. Coronary artery calcifications. 4. Atherosclerotic changes in the thoracic aorta. 5. Scattered subsegmental atelectasis. Scarring and air trapping in the apices.  Aortic Atherosclerosis (ICD10-I70.0).   EKG:  EKG is not repeated.  Recent Labs: 02/01/2018: ALT 15; BUN 12; Creatinine, Ser 1.09; Potassium 4.1; Sodium 137 02/03/2018: Hemoglobin 13.2; Platelets 182.0; TSH 0.74  Recent Lipid Panel    Component Value Date/Time   CHOL 134 02/03/2018 1343   CHOL 141 12/02/2017 1124   TRIG 52.0 02/03/2018 1343   HDL 55.90 02/03/2018 1343   HDL 53 12/02/2017 1124   CHOLHDL 2 02/03/2018 1343   VLDL 10.4 02/03/2018 1343   LDLCALC 68 02/03/2018 1343   LDLCALC 76 12/02/2017 1124   LDLDIRECT 321.8 02/27/2012 1202    Physical Exam:    VS:  BP 98/64   Pulse 63   Ht 5\' 7"  (1.702 m)    Wt 247 lb (112 kg)   SpO2 97%   BMI 38.69 kg/m     Wt Readings from Last 3 Encounters:  04/07/18 247 lb (112 kg)  02/24/18 251 lb 9.6 oz (114.1 kg)  02/16/18 246 lb (111.6 kg)     GEN: Obese.. No acute distress HEENT: Normal NECK: No JVD. LYMPHATICS: No lymphadenopathy CARDIAC: RRR.  No murmur, gallop, edema VASCULAR: Pulses are 2+ and symmetric in the arms and neck, Bruits are absent in the carotids. RESPIRATORY:  Clear to auscultation without rales, wheezing or rhonchi  ABDOMEN: Soft, non-tender, non-distended, No pulsatile mass, MUSCULOSKELETAL: No deformity  SKIN: Warm and dry NEUROLOGIC:  Alert and oriented x 3 PSYCHIATRIC:  Normal affect   ASSESSMENT:    1. S/P MVR (mitral valve repair)   2. Essential hypertension   3. Thoracic aortic aneurysm without rupture ([place])   4. Coronary artery disease involving coronary bypass graft of native heart with angina pectoris ([place])   5. Systemic lupus erythematosus with focal and segmental proliferative glomerulonephritis ([place])    PLAN:    In order of problems listed above:  1. Normally functioning mitral valve with no murmur heard.  No further evaluation at this time. 2. Excellent current blood pressure control.  Since adding amlodipine 5 mg/day blood pressure is been better controlled. 3. The change in aortic size between 2018 and 2019 is concerning.  She is already scheduled to have a follow-up chest CT with contrast which will be done in May.  If the finding is real, we need close surveillance.  There is been no recurrence of chest discomfort since adding amlodipine.  Depending upon the size of her aorta from the follow-up CT scan, we may make adjustments in medical therapy considering a change to an ARB given the new data on losartan and reduced aortic growth in the setting of Marfan's. 4. Has had some chest tightness but nothing similar to presurgery.  No specific evaluation at this time but clinical follow-up is  imperative.  Clinical observation.  Follow-up with me in 4 months after CT is performed.  No change in the current medical regimen.   Medication Adjustments/Labs and Tests Ordered: Current medicines are reviewed at length with  the patient today.  Concerns regarding medicines are outlined above.  No orders of the defined types were placed in this encounter.  No orders of the defined types were placed in this encounter.   Patient Instructions  Medication Instructions:  Your physician recommends that you continue on your current medications as directed. Please refer to the Current Medication list given to you today.  If you need a refill on your cardiac medications before your next appointment, please call your pharmacy.   Lab work: Keep lab appointment on 08/04/18 for BMET If you have labs (blood work) drawn today and your tests are completely normal, you will receive your results only by: Marland Kitchen MyChart Message (if you have MyChart) OR . A paper copy in the mail If you have any lab test that is abnormal or we need to change your treatment, we will call you to review the results.  Testing/Procedures: Keep appointment for CT Angio of Chest on 08/11/18  Follow-Up: At [facility], you and your health needs are our priority.  As part of our continuing mission to provide you with exceptional heart care, we have created designated Provider Care Teams.  These Care Teams include your primary Cardiologist (physician) and Advanced Practice Providers (APPs -  Physician Assistants and Nurse Practitioners) who all work together to provide you with the care you need, when you need it. . You will need a follow up appointment in June 2020.  Please call our office 2 months in advance to schedule this appointment.  You may see Sinclair Grooms, MD or one of the following Advanced Practice Providers on your designated Care Team:   . Truitt Merle, NP . Cecilie Kicks, NP . Kathyrn Drown, NP   Any Other  Special Instructions Will Be Listed Below (If Applicable).     Signed, Sinclair Grooms, MD  04/07/2018 5:15 PM     [facility]

## 2018-04-07 ENCOUNTER — Ambulatory Visit (INDEPENDENT_AMBULATORY_CARE_PROVIDER_SITE_OTHER): Payer: BLUE CROSS/BLUE SHIELD | Admitting: Interventional Cardiology

## 2018-04-07 ENCOUNTER — Encounter: Payer: Self-pay | Admitting: Interventional Cardiology

## 2018-04-07 VITALS — BP 98/64 | HR 63 | Ht 67.0 in | Wt 247.0 lb

## 2018-04-07 DIAGNOSIS — I25709 Atherosclerosis of coronary artery bypass graft(s), unspecified, with unspecified angina pectoris: Secondary | ICD-10-CM | POA: Diagnosis not present

## 2018-04-07 DIAGNOSIS — I712 Thoracic aortic aneurysm, without rupture, unspecified: Secondary | ICD-10-CM

## 2018-04-07 DIAGNOSIS — Z9889 Other specified postprocedural states: Secondary | ICD-10-CM

## 2018-04-07 DIAGNOSIS — I10 Essential (primary) hypertension: Secondary | ICD-10-CM

## 2018-04-07 DIAGNOSIS — M3214 Glomerular disease in systemic lupus erythematosus: Secondary | ICD-10-CM

## 2018-04-07 NOTE — Patient Instructions (Signed)
Medication Instructions:  Your physician recommends that you continue on your current medications as directed. Please refer to the Current Medication list given to you today.  If you need a refill on your cardiac medications before your next appointment, please call your pharmacy.   Lab work: Keep lab appointment on 08/04/18 for BMET If you have labs (blood work) drawn today and your tests are completely normal, you will receive your results only by: Marland Kitchen MyChart Message (if you have MyChart) OR . A paper copy in the mail If you have any lab test that is abnormal or we need to change your treatment, we will call you to review the results.  Testing/Procedures: Keep appointment for CT Angio of Chest on 08/11/18  Follow-Up: At [facility], you and your health needs are our priority.  As part of our continuing mission to provide you with exceptional heart care, we have created designated Provider Care Teams.  These Care Teams include your primary Cardiologist (physician) and Advanced Practice Providers (APPs -  Physician Assistants and Nurse Practitioners) who all work together to provide you with the care you need, when you need it. . You will need a follow up appointment in June 2020.  Please call our office 2 months in advance to schedule this appointment.  You may see Sinclair Grooms, MD or one of the following Advanced Practice Providers on your designated Care Team:   . Truitt Merle, NP . Cecilie Kicks, NP . Kathyrn Drown, NP   Any Other Special Instructions Will Be Listed Below (If Applicable).

## 2018-04-09 DIAGNOSIS — M3214 Glomerular disease in systemic lupus erythematosus: Secondary | ICD-10-CM | POA: Diagnosis not present

## 2018-04-09 DIAGNOSIS — Z6841 Body Mass Index (BMI) 40.0 and over, adult: Secondary | ICD-10-CM | POA: Diagnosis not present

## 2018-04-09 DIAGNOSIS — M329 Systemic lupus erythematosus, unspecified: Secondary | ICD-10-CM | POA: Diagnosis not present

## 2018-04-09 DIAGNOSIS — Z79899 Other long term (current) drug therapy: Secondary | ICD-10-CM | POA: Diagnosis not present

## 2018-04-18 ENCOUNTER — Other Ambulatory Visit: Payer: Self-pay | Admitting: Internal Medicine

## 2018-04-21 ENCOUNTER — Ambulatory Visit: Payer: BLUE CROSS/BLUE SHIELD | Admitting: Sports Medicine

## 2018-04-28 ENCOUNTER — Ambulatory Visit: Payer: BLUE CROSS/BLUE SHIELD | Admitting: Sports Medicine

## 2018-04-29 ENCOUNTER — Encounter: Payer: Self-pay | Admitting: Sports Medicine

## 2018-05-05 ENCOUNTER — Encounter: Payer: Self-pay | Admitting: Sports Medicine

## 2018-05-05 ENCOUNTER — Ambulatory Visit (INDEPENDENT_AMBULATORY_CARE_PROVIDER_SITE_OTHER): Payer: BLUE CROSS/BLUE SHIELD | Admitting: Sports Medicine

## 2018-05-05 ENCOUNTER — Ambulatory Visit: Payer: Self-pay

## 2018-05-05 VITALS — BP 126/84 | HR 61 | Ht 67.0 in | Wt 250.8 lb

## 2018-05-05 DIAGNOSIS — S83411A Sprain of medial collateral ligament of right knee, initial encounter: Secondary | ICD-10-CM | POA: Diagnosis not present

## 2018-05-05 DIAGNOSIS — M25561 Pain in right knee: Secondary | ICD-10-CM | POA: Diagnosis not present

## 2018-05-05 DIAGNOSIS — M519 Unspecified thoracic, thoracolumbar and lumbosacral intervertebral disc disorder: Secondary | ICD-10-CM | POA: Diagnosis not present

## 2018-05-05 DIAGNOSIS — M5442 Lumbago with sciatica, left side: Secondary | ICD-10-CM | POA: Diagnosis not present

## 2018-05-05 DIAGNOSIS — G8929 Other chronic pain: Secondary | ICD-10-CM | POA: Diagnosis not present

## 2018-05-05 DIAGNOSIS — I25709 Atherosclerosis of coronary artery bypass graft(s), unspecified, with unspecified angina pectoris: Secondary | ICD-10-CM

## 2018-05-05 DIAGNOSIS — M3214 Glomerular disease in systemic lupus erythematosus: Secondary | ICD-10-CM | POA: Diagnosis not present

## 2018-05-05 MED ORDER — DICLOFENAC SODIUM 1 % TD GEL: TRANSDERMAL | 1 refills | Status: DC

## 2018-05-05 NOTE — Progress Notes (Signed)
Juanda Bond. Jazmyn Offner, [facility]  Algie Cales - 63 y.o. female MRN [id number]  Date of birth: 1955-12-02  Visit Date: May 05, 2018  PCP: Janith Lima, MD   Referred by: Janith Lima, MD  SUBJECTIVE:  Chief Complaint  Patient presents with  . Right Knee - Initial Assessment  . Follow-up    L-sided LBP w/ L LE radicular pain.    HPI: Patient is here for follow-up of her low back and radicular pain.  She is status post epidural steroid injection with Dr. Ernestina Patches on 03/26/2018 and responded very well with essentially complete improvement in her symptoms.  Unfortunately she did trip while going down the steps while carrying a load of laundry last weekend and had a hyperflexion valgus strain on her right knee and she has been having pain since that time.  Now the pain in her medial knee is moderate and described as sharp.  It is worse with weightbearing and walking.  Terminal flexion is also painful.  She has been taking Tylenol for this and naproxen intermittently and gabapentin although she is trying to avoid anti-inflammatories systemically.  REVIEW OF SYSTEMS: No significant nighttime awakenings due to this issue. Denies fevers, chills, recent weight gain or weight loss.  No night sweats.  Pt denies any change in bowel or bladder habits, muscle weakness, numbness or falls associated with this pain.  HISTORY:  Prior history reviewed and updated per electronic medical record.  Patient Active Problem List   Diagnosis Date Noted  . Cervical cancer screening 05/27/2017  . Visit for screening mammogram 05/27/2017  . Routine general medical examination at a health care facility 02/18/2017  . Postoperative atrial fibrillation ([place]) 02/18/2017  . S/P CABG x 4 01/30/2017    LIMA to LAD SVG SEQUENTIALLY to OM1 and OM2 SVG to PDA   . S/P MVR (mitral valve repair) 01/30/2017    Sorin Memo 3D ring  annuloplasty Size 26   . Osteoarthritis of spine with radiculopathy, lumbar region 06/24/2016  . Hypothyroidism 04/08/2011  . Coronary artery disease involving coronary bypass graft of native heart with angina pectoris ([place]) 02/26/2009    2003 Inferior MI, BMS to RCA and circumfles with PTCA of diagonal later that year Cath April '05 - Patent stents mid-Cx, RCA, no MR Cath Sept '08 - no change x/ MR Nuclear Stress Sept 15- Apical/inferior wall perfusion defect. No active ischemia. Low risk study  2018 CABG with LIMA to LAD, SVG to OM 1 and 2, and SVG to PDA    . Hyperlipidemia with target LDL less than 70 01/26/2009  . Immune thrombocytopenic purpura ([place]) 08/05/2007  . DVT 08/05/2007  . GERD 08/05/2007  . Systemic lupus erythematosus with focal and segmental proliferative glomerulonephritis ([place]) 08/05/2007   Social History   Occupational History  . Not on file  Tobacco Use  . Smoking status: Former Smoker    Packs/day: 0.50    Years: 30.00    Pack years: 15.00    Types: Cigarettes    Last attempt to quit: 12/23/2011    Years since quitting: 6.3  . Smokeless tobacco: Never Used  Substance and Sexual Activity  . Alcohol use: No  . Drug use: No  . Sexual activity: Not Currently    Comment: 1st intercourse 63 yo-More than 5 partners   Social History   Social History Narrative   HSG,  graduated from [employer] in [place]  state. In college [name] -grad '13 with relgious studies. [employer] - Fall '13 for MA-divinity. Marrried '73 - 2 years/ divorced. 2 son- '73, '75 - CP.    Occupation: full-time Ship broker. She lives alone with her younger son living with her part-time but he resides in a managed care facility.     OBJECTIVE:  VS:  HT:5\' 7"  (170.2 cm)   WT:250 lb 12.8 oz (113.8 kg)  BMI:39.27    BP:126/84  HR:61bpm  TEMP: ( )  RESP:97 %   PHYSICAL EXAM: Adult female. No acute distress.  Alert and appropriate. Negative straight leg raise bilaterally.  Large  soft tissue envelopes of bilateral knees with mild synovitis of the right knee.  No significant effusion.  She has full extension and slight pain with terminal flexion on the right but it is symmetric.  Pain is most focal with valgus stressing of the right knee especially at 30 degrees of flexion causes most focal pain.  Extensor mechanism strength intact.    ASSESSMENT:   1. Chronic left-sided low back pain with left-sided sciatica   2. Systemic lupus erythematosus with focal and segmental proliferative glomerulonephritis ([place])   3. Intervertebral disc disorder   4. Acute pain of right knee   5. Sprain of medial collateral ligament of right knee, initial encounter     PROCEDURES:  None  PLAN:  Pertinent additional documentation may be included in corresponding procedure notes, imaging studies, problem based documentation and patient instructions.  No problem-specific Assessment & Plan notes found for this encounter.   Symptoms are consistent with a grade 1 MCL strain following the fall.  Symptomatic treatment including icing and topical anti-inflammatories.  Continue with strengthening exercises for this as well as for back pain.  She has been doing significantly better following the epidural steroid injection but does continue to have chronic low back pain that is mechanical in nature at this time.  Referral to PT placed today.  Activity modifications and the importance of avoiding exacerbating activities (limiting pain to no more than a 4 / 10 during or following activity) recommended and discussed.  Discussed red flag symptoms that warrant earlier emergent evaluation and patient voices understanding.   Meds ordered this encounter  Medications  . diclofenac sodium (VOLTAREN) 1 % GEL    Sig: Apply topically to affected area qid    Dispense:  100 g    Refill:  1   Lab Orders  No laboratory test(s) ordered today   Imaging Orders  No imaging studies ordered today     Referral Orders     Ambulatory referral to Physical Therapy  Return in about 4 weeks (around 06/02/2018).          Gerda Diss, [facility] Physician

## 2018-05-18 ENCOUNTER — Ambulatory Visit: Payer: BLUE CROSS/BLUE SHIELD | Admitting: Physical Therapy

## 2018-05-27 ENCOUNTER — Encounter: Payer: Self-pay | Admitting: Physical Therapy

## 2018-05-27 ENCOUNTER — Ambulatory Visit: Payer: BLUE CROSS/BLUE SHIELD | Attending: Sports Medicine | Admitting: Physical Therapy

## 2018-05-27 ENCOUNTER — Other Ambulatory Visit: Payer: Self-pay

## 2018-05-27 DIAGNOSIS — M25561 Pain in right knee: Secondary | ICD-10-CM | POA: Diagnosis not present

## 2018-05-27 DIAGNOSIS — R262 Difficulty in walking, not elsewhere classified: Secondary | ICD-10-CM | POA: Diagnosis not present

## 2018-05-27 DIAGNOSIS — G8929 Other chronic pain: Secondary | ICD-10-CM | POA: Diagnosis not present

## 2018-05-27 DIAGNOSIS — M5440 Lumbago with sciatica, unspecified side: Secondary | ICD-10-CM | POA: Insufficient documentation

## 2018-05-27 DIAGNOSIS — M6281 Muscle weakness (generalized): Secondary | ICD-10-CM

## 2018-05-27 NOTE — Therapy (Signed)
[place], [place], [address] Phone: [phone]   Fax:  [phone]  Physical Therapy Evaluation  Patient Details  Name: [name] MRN: [id number] Date of Birth: [birth year] Referring Provider (PT): Dr. Teresa Coombs    Encounter Date: 05/27/2018  PT End of Session - 05/27/18 1838    Visit Number  1    Number of Visits  16    Date for PT Re-Evaluation  07/22/18    PT Start Time  1503    PT Stop Time  1549    PT Time Calculation (min)  46 min    Activity Tolerance  Patient tolerated treatment well    Behavior During Therapy  [facility] for tasks assessed/performed       Past Medical History:  Diagnosis Date  . CAD (coronary artery disease)    a. remote stents/angioplasty. b. s/p CABG 01/2017 with mitral valve annuloplasty.  . Esophageal reflux   . GERD (gastroesophageal reflux disease)   . Hypothyroidism   . Immune thrombocytopenic purpura ([place])   . Lupus erythematosus   . Mitral valve insufficiency and aortic valve insufficiency   . Myocardial infarction ([place])    x 2  . PONV (postoperative nausea and vomiting)   . Postoperative atrial fibrillation ([place])   . Severe mitral regurgitation    a. s/p MV annuloplasty 01/2017 at time of CABG.  . Status post mitral valve annuloplasty   . Unspecified disease of pericardium     Past Surgical History:  Procedure Laterality Date  . CHOLECYSTECTOMY, LAPAROSCOPIC  2010  . CORONARY ARTERY BYPASS GRAFT N/A 01/30/2017   Procedure: CORONARY ARTERY BYPASS GRAFTING (CABG), Times four  using the right saphaneous vein, harvested endoscopicly.  and left internal mammary artery .;  Surgeon: Gaye Pollack, MD;  Location: [facility] OR;  Service: Open Heart Surgery;  Laterality: N/A;  . LEFT HEART CATH AND CORONARY ANGIOGRAPHY N/A 01/23/2017   Procedure: LEFT HEART CATH AND CORONARY ANGIOGRAPHY;  Surgeon: Belva Crome, MD;  Location: [place] [facility];  Service:  Cardiovascular;  Laterality: N/A;  . MITRAL VALVE REPAIR N/A 01/30/2017   Procedure: MITRAL VALVE REPAIR (MVR);  Surgeon: Gaye Pollack, MD;  Location: [place];  Service: Open Heart Surgery;  Laterality: N/A;  . plastic surgical repair      dog bite to leg  . SPLENECTOMY  5-04  . TEE WITHOUT CARDIOVERSION N/A 01/27/2017   Procedure: TRANSESOPHAGEAL ECHOCARDIOGRAM (TEE);  Surgeon: Sanda Klein, MD;  Location: [facility] ENDOSCOPY;  Service: Cardiovascular;  Laterality: N/A;  . TEE WITHOUT CARDIOVERSION N/A 01/30/2017   Procedure: TRANSESOPHAGEAL ECHOCARDIOGRAM (TEE);  Surgeon: Gaye Pollack, MD;  Location: [place];  Service: Open Heart Surgery;  Laterality: N/A;  . ULTRASOUND GUIDANCE FOR VASCULAR ACCESS  01/23/2017   Procedure: Ultrasound Guidance For Vascular Access;  Surgeon: Belva Crome, MD;  Location: [place] [facility];  Service: Cardiovascular;;    There were no vitals filed for this visit.   Subjective Assessment - 05/27/18 1508    Subjective  Pt with chronic Lt sided low back pain which radiates into her LLE.  She has received injections with good outcome.  Pain also radiates into her middle back. She started having pain in her back about a yr ago.  However, she fell going down the stairs (1 month ago) now has Rt knee pain.  She has intermittnet knee pain with turning her knee laterally.  She has been wearing a brace,  it is getting better.  She denies Rt knee weakness but L LE tingling at times to her toes.      Pertinent History  chronic LBP, cardiac issues (CABG, mitral valve) , lupus, OA in back , obesity     Limitations  Standing;Lifting;House hold activities    How long can you sit comfortably?  sitting aggravates her pain > 15 min     How long can you walk comfortably?  afraid to go out and walk but can stand for 30 min in her home     Diagnostic tests  None recent     Patient Stated Goals  Pt would like to be pain free.     Currently in Pain?  Yes    Pain Score  8     Pain  Location  Back    Pain Orientation  Mid;Lower;Right;Left    Pain Descriptors / Indicators  Burning;Tightness    Pain Type  Chronic pain    Pain Radiating Towards  mid back and LLE     Pain Onset  More than a month ago    Pain Frequency  Intermittent    Aggravating Factors   sitting and over activity     Pain Relieving Factors  Tylenol, changing positions , stretching     Effect of Pain on Daily Activities  limits her confidence with getting out     Multiple Pain Sites  Yes    Pain Score  4    Pain Location  Knee    Pain Orientation  Right    Pain Descriptors / Indicators  Sore    Pain Type  Acute pain    Pain Onset  More than a month ago    Pain Frequency  Intermittent    Aggravating Factors   twisting , walking     Pain Relieving Factors  brace, meds          [facility] PT Assessment - 05/27/18 0001      Assessment   Medical Diagnosis  LBP, knee pain/MCL strain     Referring Provider (PT)  Dr. Teresa Coombs     Onset Date/Surgical Date  --   acute on chronic, 1 yr ago    Next MD Visit  next week     Prior Therapy  No       Precautions   Precautions  None    Precaution Comments  --      Restrictions   Weight Bearing Restrictions  No      Balance Screen   Has the patient fallen in the past 6 months  Yes    How many times?  1    Has the patient had a decrease in activity level because of a fear of falling?   Yes    Is the patient reluctant to leave their home because of a fear of falling?   Yes      [place] residence    Living Arrangements  Alone    Type of [place] Access  Level entry    [place]  Two level    Alternate Level Stairs-Number of Steps  17    Alternate Level Stairs-Rails  Right    Additional Comments  walker does not use       Prior Function   Level of Independence  Independent    Vocation  Full time employment    Vocation Requirements  chaplain     Leisure  TV, reading , friends, socializing        Cognition   Overall Cognitive Status  Within Functional Limits for tasks assessed      Observation/Other Assessments   Focus on Therapeutic Outcomes (FOTO)   60%      Sensation   Light Touch  Impaired by gross assessment    Additional Comments  L post thigh and toes on L foot tingly at times       Functional Tests   Functional tests  Squat;Single leg stance      Squat   Comments  pain       Single Leg Stance   Comments  impaired, pelvis rotates, < 8 sec       Posture/Postural Control   Posture/Postural Control  Postural limitations    Postural Limitations  Rounded Shoulders;Forward head;Increased lumbar lordosis      Palpation   Patella mobility  hypomobile     Spinal mobility  pain and stiff L5 into sacral levels and across superior glutes    Palpation comment  lumbar soreness, into mid back centrally . Knee pain medial joint line and medial patella       Special Tests   Other special tests  NT due to pain and MCL strain known                 Objective measurements completed on examination: See above findings.      [facility] Adult PT Treatment/Exercise - 05/27/18 0001      Self-Care   Self-Care  ADL's;Posture;Heat/Ice Application;Other Self-Care Comments    Posture  reducing compression     Heat/Ice Application  heat to relax mm, ice for knee     Other Self-Care Comments   HEP      Lumbar Exercises: Stretches   Standing Extension  3 reps    Standing Extension Limitations  used wall     Gastroc Stretch  3 reps    Other Lumbar Stretch Exercise  sink stretch x 5 for post stretching              PT Education - 05/27/18 1838    Education Details  PT/POC, HEP, knee pain    Person(s) Educated  Patient    Methods  Explanation;Demonstration;Handout    Comprehension  Verbalized understanding;Returned demonstration       PT Short Term Goals - 05/27/18 1850      PT SHORT TERM GOAL #1   Title  Pt will be I with initial HEP for knee, lumbar spine     Time  4    Period  Weeks    Status  New    Target Date  06/24/18      PT SHORT TERM GOAL #2   Title  Pt will report less back pain with ADLs (25%) due to proper body mechanics and core strength    Time  4    Period  Weeks    Status  New    Target Date  06/24/18      PT SHORT TERM GOAL #3   Title  Pt will report knee pain with walking , transfers down to 2/10 or less    Time  4    Period  Weeks    Status  New    Target Date  06/24/18      PT SHORT TERM GOAL #4   Title  Pt will be independent with RICE, self care for  pain control     Time  4    Period  Weeks    Status  New    Target Date  06/24/18        PT Long Term Goals - 05/27/18 1925      PT LONG TERM GOAL #1   Title  Pt will be I with Final HEP for prevention of reinjury    Time  8    Period  Weeks    Status  New    Target Date  07/22/18      PT LONG TERM GOAL #2   Title  Pt will be able to negotiate stairs reciprocally using 1 rail, in her home (17 steps) without increased pain     Time  8    Period  Weeks    Status  New    Target Date  07/22/18      PT LONG TERM GOAL #3   Title  Pt will be able to sit/stand for 30 min and participate in social activities with pain in back mild most of the time     Time  8    Period  Weeks    Status  New    Target Date  07/22/18      PT LONG TERM GOAL #4   Title  Pt wil be able to engage in regular physical activity 3 times per week for long term pain control and wellness.     Time  8    Period  Weeks    Status  New    Target Date  07/22/18      PT LONG TERM GOAL #5   Title  FOTO score will improve to no more than 45% limited with respect to low back pain.     Time  8    Period  Weeks    Status  New    Target Date  07/22/18             Plan - 05/27/18 1839    Clinical Impression Statement  Pt presents for mod complexity eval of low back pain, knee pain.  The back has been onging and the knee is more acute.  She is currently being treated with ESI by Dr.  Ernestina Patches.  She has had some improvement but continues with LLE radicular symptoms.  No signs of worrisome weakness or red flags. She is very tender in medial joint line Rt knee but no instability and has near full AROM.  She should do well and understands the need to be more physically active to improve her overall health.     Personal Factors and Comorbidities  Comorbidity 1;Comorbidity 2;Comorbidity 3+;Fitness    Comorbidities  lupus, obesity, HTN, multiple joints affected    Examination-Activity Limitations  Bend;Squat;Carry;Stairs;Stand;Transfers;Locomotion Level    Examination-Participation Restrictions  Shop;Community Activity;Other;Laundry    Stability/Clinical Decision Making  Evolving/Moderate complexity    Clinical Decision Making  Moderate    Rehab Potential  Good    PT Frequency  2x / week    PT Duration  8 weeks    PT Treatment/Interventions  ADLs/Self Care Home Management;Moist Heat;Therapeutic activities;Taping;Electrical Stimulation;Cryotherapy;Iontophoresis 4mg /ml Dexamethasone;Functional mobility training;Gait training;Stair training;Patient/family education;Passive range of motion;Neuromuscular re-education;Therapeutic exercise;Ultrasound;Balance training;Manual techniques    PT Next Visit Plan  check HEP, modalities for knee, low back. ionto    PT Home Exercise Plan  calf stretch, wall slides, sink stretch    Consulted and Agree with Plan of Care  Patient  Patient will benefit from skilled therapeutic intervention in order to improve the following deficits and impairments:  Decreased endurance, Obesity, Decreased strength, Increased fascial restricitons, Impaired UE functional use, Postural dysfunction, Impaired flexibility, Decreased range of motion, Improper body mechanics, Difficulty walking, Decreased mobility, Decreased activity tolerance  Visit Diagnosis: Chronic left-sided low back pain with sciatica, sciatica laterality unspecified  Muscle weakness  (generalized)  Acute pain of right knee  Difficulty in walking, not elsewhere classified     Problem List Patient Active Problem List   Diagnosis Date Noted  . Cervical cancer screening 05/27/2017  . Visit for screening mammogram 05/27/2017  . Routine general medical examination at a health care facility 02/18/2017  . Postoperative atrial fibrillation ([place]) 02/18/2017  . S/P CABG x 4 01/30/2017  . S/P MVR (mitral valve repair) 01/30/2017  . Osteoarthritis of spine with radiculopathy, lumbar region 06/24/2016  . Hypothyroidism 04/08/2011  . Coronary artery disease involving coronary bypass graft of native heart with angina pectoris ([place]) 02/26/2009  . Hyperlipidemia with target LDL less than 70 01/26/2009  . Immune thrombocytopenic purpura ([place]) 08/05/2007  . DVT 08/05/2007  . GERD 08/05/2007  . Systemic lupus erythematosus with focal and segmental proliferative glomerulonephritis ([place]) 08/05/2007    [name] 05/27/2018, 7:37 PM  [facility] [address] [place], [place], [address] Phone: [phone]   Fax:  [phone]  Name: [name] MRN: [id number] Date of Birth: 05-09-1955   Raeford Razor, PT 05/27/18 7:37 PM Phone: [phone] Fax: [phone]

## 2018-06-02 ENCOUNTER — Ambulatory Visit: Payer: BLUE CROSS/BLUE SHIELD | Admitting: Sports Medicine

## 2018-06-09 ENCOUNTER — Telehealth: Payer: Self-pay | Admitting: Sports Medicine

## 2018-06-09 ENCOUNTER — Ambulatory Visit: Payer: BLUE CROSS/BLUE SHIELD | Admitting: Sports Medicine

## 2018-06-09 NOTE — Telephone Encounter (Signed)
Called and spoke with patient regarding his appointment today.  The setting of the cover 19 and her other comorbidities we will plan to reschedule her appointment for later date.  She is overall improving and functioning quite well.  She has minimal pain and the physical therapy has been helpful.  No other concerns are voiced by the patient today.  2-minute phone call

## 2018-06-14 ENCOUNTER — Ambulatory Visit: Payer: BLUE CROSS/BLUE SHIELD | Admitting: Physical Therapy

## 2018-06-16 ENCOUNTER — Other Ambulatory Visit: Payer: Self-pay

## 2018-06-16 ENCOUNTER — Ambulatory Visit: Payer: BLUE CROSS/BLUE SHIELD | Admitting: Physical Therapy

## 2018-06-18 ENCOUNTER — Encounter: Payer: Self-pay | Admitting: Gynecology

## 2018-06-18 ENCOUNTER — Other Ambulatory Visit: Payer: Self-pay

## 2018-06-18 ENCOUNTER — Ambulatory Visit (INDEPENDENT_AMBULATORY_CARE_PROVIDER_SITE_OTHER): Payer: BLUE CROSS/BLUE SHIELD | Admitting: Gynecology

## 2018-06-18 ENCOUNTER — Encounter: Payer: BLUE CROSS/BLUE SHIELD | Admitting: Gynecology

## 2018-06-18 VITALS — BP 120/78 | Ht 65.0 in | Wt 249.0 lb

## 2018-06-18 DIAGNOSIS — Z01419 Encounter for gynecological examination (general) (routine) without abnormal findings: Secondary | ICD-10-CM

## 2018-06-18 DIAGNOSIS — N952 Postmenopausal atrophic vaginitis: Secondary | ICD-10-CM

## 2018-06-18 NOTE — Patient Instructions (Signed)
Follow-up in 1 year for annual exam, sooner if any issues. 

## 2018-06-18 NOTE — Progress Notes (Signed)
    Reianna [facility]dorf [facility] 08-05-1955 [id number]        63 y.o.  G2P2 for annual gynecologic exam.  Without gynecologic complaints  Past medical history,surgical history, problem list, medications, allergies, family history and social history were all reviewed and documented as reviewed in the EPIC chart.  ROS:  Performed with pertinent positives and negatives included in the history, assessment and plan.   Additional significant findings : None   Exam: Caryn Bee assistant Vitals:   06/18/18 1036  BP: 120/78  Weight: 249 lb (112.9 kg)  Height: 5\' 5"  (1.651 m)   Body mass index is 41.44 kg/m.  General appearance:  Normal affect, orientation and appearance. Skin: Grossly normal HEENT: Without gross lesions.  No cervical or supraclavicular adenopathy. Thyroid normal.  Lungs:  Clear without wheezing, rales or rhonchi Cardiac: RR, without RMG Abdominal:  Soft, nontender, without masses, guarding, rebound, organomegaly or hernia Breasts:  Examined lying and sitting without masses, retractions, discharge or axillary adenopathy. Pelvic:  Ext, BUS, Vagina: With atrophic changes  Cervix: With atrophic changes  Uterus: Anteverted, normal size, shape and contour, midline and mobile nontender   Adnexa: Without masses or tenderness    Anus and perineum: Normal   Rectovaginal: Normal sphincter tone without palpated masses or tenderness.    Assessment/Plan:  63 y.o. G2P2 female for annual gynecologic exam.   1. Postmenopausal.  No significant menopausal symptoms or any bleeding. 2. Pap smear/HPV 2019.  No Pap smear done today.  No history of abnormal Pap smears.  Is on Plaquenil and Imuran.  Has not been sexually active since last Pap smear/HPV.  Will plan Pap smear next year at every other year interval. 3. Mammography 12/2017.  Continue with annual mammography when due.  Breast exam normal today. 4. DEXA 2019 normal.  Plan repeat DEXA at 5-year interval. 5. Colonoscopy 2016.  Is due for  repeat now by her history and she is going to call Dr. Lorie Apley office to arrange 6. Health maintenance.  No routine lab work done as patient does this elsewhere.  Follow-up 1 year, sooner as needed.   Anastasio Auerbach MD, 11:03 AM 06/18/2018

## 2018-06-21 ENCOUNTER — Ambulatory Visit: Payer: BLUE CROSS/BLUE SHIELD | Admitting: Physical Therapy

## 2018-06-23 ENCOUNTER — Ambulatory Visit: Payer: BLUE CROSS/BLUE SHIELD | Admitting: Physical Therapy

## 2018-06-24 DIAGNOSIS — N181 Chronic kidney disease, stage 1: Secondary | ICD-10-CM | POA: Diagnosis not present

## 2018-06-24 DIAGNOSIS — N189 Chronic kidney disease, unspecified: Secondary | ICD-10-CM | POA: Diagnosis not present

## 2018-06-28 ENCOUNTER — Ambulatory Visit: Payer: BLUE CROSS/BLUE SHIELD | Admitting: Physical Therapy

## 2018-06-30 ENCOUNTER — Ambulatory Visit: Payer: BLUE CROSS/BLUE SHIELD | Admitting: Physical Therapy

## 2018-07-01 DIAGNOSIS — M3214 Glomerular disease in systemic lupus erythematosus: Secondary | ICD-10-CM | POA: Diagnosis not present

## 2018-07-01 DIAGNOSIS — M329 Systemic lupus erythematosus, unspecified: Secondary | ICD-10-CM | POA: Diagnosis not present

## 2018-07-01 DIAGNOSIS — Z79899 Other long term (current) drug therapy: Secondary | ICD-10-CM | POA: Diagnosis not present

## 2018-07-15 ENCOUNTER — Other Ambulatory Visit: Payer: Self-pay | Admitting: Internal Medicine

## 2018-07-22 DIAGNOSIS — N3946 Mixed incontinence: Secondary | ICD-10-CM | POA: Diagnosis not present

## 2018-07-22 DIAGNOSIS — R350 Frequency of micturition: Secondary | ICD-10-CM | POA: Diagnosis not present

## 2018-07-22 DIAGNOSIS — R351 Nocturia: Secondary | ICD-10-CM | POA: Diagnosis not present

## 2018-07-23 ENCOUNTER — Ambulatory Visit
Admission: RE | Admit: 2018-07-23 | Discharge: 2018-07-23 | Disposition: A | Discharge status: Home / Self Care | Payer: BLUE CROSS/BLUE SHIELD | Source: Ambulatory Visit | Attending: Internal Medicine | Admitting: Internal Medicine

## 2018-07-23 ENCOUNTER — Other Ambulatory Visit: Payer: Self-pay

## 2018-07-23 DIAGNOSIS — N6320 Unspecified lump in the left breast, unspecified quadrant: Secondary | ICD-10-CM

## 2018-07-23 DIAGNOSIS — N6321 Unspecified lump in the left breast, upper outer quadrant: Secondary | ICD-10-CM | POA: Diagnosis not present

## 2018-07-23 LAB — HM MAMMOGRAPHY

## 2018-07-27 ENCOUNTER — Telehealth: Payer: Self-pay | Admitting: Physical Therapy

## 2018-07-27 NOTE — Telephone Encounter (Signed)
Spoke with pt- she would like to return to in-clinic visits to continue PT POC.  [place] Antuna C. Kimyatta Lecy PT, DPT 07/27/18 10:14 AM

## 2018-08-04 ENCOUNTER — Other Ambulatory Visit: Payer: BLUE CROSS/BLUE SHIELD | Admitting: *Deleted

## 2018-08-04 ENCOUNTER — Other Ambulatory Visit: Payer: Self-pay

## 2018-08-04 DIAGNOSIS — I712 Thoracic aortic aneurysm, without rupture, unspecified: Secondary | ICD-10-CM

## 2018-08-04 LAB — BASIC METABOLIC PANEL
BUN/Creatinine Ratio: 14 (ref 12–28)
BUN: 18 mg/dL (ref 8–27)
CO2: 24 mmol/L (ref 20–29)
Calcium: 9.5 mg/dL (ref 8.7–10.3)
Chloride: 102 mmol/L (ref 96–106)
Creatinine, Ser: 1.28 mg/dL — ABNORMAL HIGH (ref 0.57–1.00)
GFR calc Af Amer: 52 mL/min/{1.73_m2} — ABNORMAL LOW (ref >59)
GFR calc non Af Amer: 45 mL/min/{1.73_m2} — ABNORMAL LOW (ref >59)
Glucose: 88 mg/dL (ref 65–99)
Potassium: 4.8 mmol/L (ref 3.5–5.2)
Sodium: 140 mmol/L (ref 134–144)

## 2018-08-09 ENCOUNTER — Other Ambulatory Visit: Payer: Self-pay

## 2018-08-10 ENCOUNTER — Telehealth: Payer: Self-pay | Admitting: *Deleted

## 2018-08-10 ENCOUNTER — Other Ambulatory Visit: Payer: Self-pay | Admitting: Interventional Cardiology

## 2018-08-10 MED ORDER — PANTOPRAZOLE SODIUM 40 MG PO TBEC: 40.0000 mg | DELAYED_RELEASE_TABLET | Freq: Every day | ORAL | 1 refills | Status: DC

## 2018-08-10 NOTE — Telephone Encounter (Signed)
Called Verified patient, completed screening, and confirmed appointment.  COVID-19 Pre-Screening Questions:  In the past 7 to 10 days have you had a cough, shortness of breath, headache, congestion, fever, body aches, chills, sore throat, or sudden loss of taste or sense of smell? No  Have you been around anyone with known Covid 19. No  Have you been around anyone who is awaiting Covid 19 test results in the past 7 to 10 days? No  Have you been around anyone who has been exposed to Covid 19, or has mentioned symptoms of Covid 19 within the past 7 to 10 days? No  If you have any concerns about symptoms your patients report please contact your leadership team, or the provider the patient is seeing in the office for further guidance.

## 2018-08-10 NOTE — Telephone Encounter (Signed)
Patient's pharmacy is requesting a refill for Protonix 40 mg. I was going to refer to pt's PCP, but it looks like she haven't been seen there for awhile. Dr. Tamala Julian filled it last. Is it ok to fill, if so what quantity? Thank you.

## 2018-08-10 NOTE — Telephone Encounter (Signed)
ok 

## 2018-08-11 ENCOUNTER — Other Ambulatory Visit: Payer: Self-pay

## 2018-08-11 ENCOUNTER — Ambulatory Visit (INDEPENDENT_AMBULATORY_CARE_PROVIDER_SITE_OTHER)
Admission: RE | Admit: 2018-08-11 | Discharge: 2018-08-11 | Disposition: A | Discharge status: Home / Self Care | Payer: BLUE CROSS/BLUE SHIELD | Source: Ambulatory Visit | Attending: Nurse Practitioner | Admitting: Nurse Practitioner

## 2018-08-11 DIAGNOSIS — I712 Thoracic aortic aneurysm, without rupture, unspecified: Secondary | ICD-10-CM

## 2018-08-11 DIAGNOSIS — I7101 Dissection of thoracic aorta: Secondary | ICD-10-CM | POA: Diagnosis not present

## 2018-08-11 DIAGNOSIS — J439 Emphysema, unspecified: Secondary | ICD-10-CM | POA: Diagnosis not present

## 2018-08-11 MED ORDER — IOHEXOL 350 MG/ML SOLN
100.0000 mL | Freq: Once | INTRAVENOUS | Status: AC | PRN
  Administered 2018-08-11: 100 mL via INTRAVENOUS

## 2018-08-11 MED ORDER — PANTOPRAZOLE SODIUM 40 MG PO TBEC: 40.0000 mg | DELAYED_RELEASE_TABLET | Freq: Every day | ORAL | 1 refills | Status: DC

## 2018-08-17 ENCOUNTER — Telehealth: Payer: Self-pay

## 2018-08-17 NOTE — Telephone Encounter (Signed)
YOUR CARDIOLOGY TEAM HAS ARRANGED FOR AN E-VISIT FOR YOUR APPOINTMENT - PLEASE REVIEW IMPORTANT INFORMATION BELOW SEVERAL DAYS PRIOR TO YOUR APPOINTMENT  Due to the recent COVID-19 pandemic, we are transitioning in-person office visits to tele-medicine visits in an effort to decrease unnecessary exposure to our patients, their families, and staff. These visits are billed to your insurance just like a normal visit is. We also encourage you to sign up for MyChart if you have not already done so. You will need a smartphone if possible. For patients that do not have this, we can still complete the visit using a regular telephone but do prefer a smartphone to enable video when possible. You may have a family member that lives with you that can help. If possible, we also ask that you have a blood pressure cuff and scale at home to measure your blood pressure, heart rate and weight prior to your scheduled appointment. Patients with clinical needs that need an in-person evaluation and testing will still be able to come to the office if absolutely necessary. If you have any questions, feel free to call our office.     YOUR PROVIDER WILL BE USING THE FOLLOWING PLATFORM TO COMPLETE YOUR VISIT: Doximity  . IF USING MYCHART - How to Download the MyChart App to Your SmartPhone   - If Apple, go to App Store and type in MyChart in the search bar and download the app. If Android, ask patient to go to Google Play Store and type in MyChart in the search bar and download the app. The app is free but as with any other app downloads, your phone may require you to verify saved payment information or Apple/Android password.  - You will need to then log into the app with your MyChart username and password, and select [facility] as your healthcare provider to link the account.  - When it is time for your visit, go to the MyChart app, find appointments, and click Begin Video Visit. Be sure to Select Allow for your device to  access the Microphone and Camera for your visit. You will then be connected, and your provider will be with you shortly.  **If you have any issues connecting or need assistance, please contact MyChart service desk [phone] ([phone])**  **If using a computer, in order to ensure the best quality for your visit, you will need to use either of the following Internet Browsers: Google Chrome or Microsoft Edge**  . IF USING DOXIMITY or DOXY.ME - The staff will give you instructions on receiving your link to join the meeting the day of your visit.      2-3 DAYS BEFORE YOUR APPOINTMENT  You will receive a telephone call from one of our [facility] team members - your caller ID may say "Unknown caller." If this is a video visit, we will walk you through how to get the video launched on your phone. We will remind you check your blood pressure, heart rate and weight prior to your scheduled appointment. If you have an Apple Watch or Kardia, please upload any pertinent ECG strips the day before or morning of your appointment to MyChart. Our staff will also make sure you have reviewed the consent and agree to move forward with your scheduled tele-health visit.     THE DAY OF YOUR APPOINTMENT  Approximately 15 minutes prior to your scheduled appointment, you will receive a telephone call from one of Heart[facility] team - your caller ID may say "Unknown caller."    Our staff will confirm medications, vital signs for the day and any symptoms you may be experiencing. Please have this information available prior to the time of visit start. It may also be helpful for you to have a pad of paper and pen handy for any instructions given during your visit. They will also walk you through joining the smartphone meeting if this is a video visit.    CONSENT FOR TELE-HEALTH VISIT - PLEASE REVIEW  I hereby voluntarily request, consent and authorize [facility] and its employed or contracted physicians, physician  assistants, nurse practitioners or other licensed health care professionals (the Practitioner), to provide me with telemedicine health care services (the "Services") as deemed necessary by the treating Practitioner. I acknowledge and consent to receive the Services by the Practitioner via telemedicine. I understand that the telemedicine visit will involve communicating with the Practitioner through live audiovisual communication technology and the disclosure of certain medical information by electronic transmission. I acknowledge that I have been given the opportunity to request an in-person assessment or other available alternative prior to the telemedicine visit and am voluntarily participating in the telemedicine visit.  I understand that I have the right to withhold or withdraw my consent to the use of telemedicine in the course of my care at any time, without affecting my right to future care or treatment, and that the Practitioner or I may terminate the telemedicine visit at any time. I understand that I have the right to inspect all information obtained and/or recorded in the course of the telemedicine visit and may receive copies of available information for a reasonable fee.  I understand that some of the potential risks of receiving the Services via telemedicine include:  . Delay or interruption in medical evaluation due to technological equipment failure or disruption; . Information transmitted may not be sufficient (e.g. poor resolution of images) to allow for appropriate medical decision making by the Practitioner; and/or  . In rare instances, security protocols could fail, causing a breach of personal health information.  Furthermore, I acknowledge that it is my responsibility to provide information about my medical history, conditions and care that is complete and accurate to the best of my ability. I acknowledge that Practitioner's advice, recommendations, and/or decision may be based on  factors not within their control, such as incomplete or inaccurate data provided by me or distortions of diagnostic images or specimens that may result from electronic transmissions. I understand that the practice of medicine is not an exact science and that Practitioner makes no warranties or guarantees regarding treatment outcomes. I acknowledge that I will receive a copy of this consent concurrently upon execution via email to the email address I last provided but may also request a printed copy by calling the office of [facility].    I understand that my insurance will be billed for this visit.   I have read or had this consent read to me. . I understand the contents of this consent, which adequately explains the benefits and risks of the Services being provided via telemedicine.  . I have been provided ample opportunity to ask questions regarding this consent and the Services and have had my questions answered to my satisfaction. . I give my informed consent for the services to be provided through the use of telemedicine in my medical care  By participating in this telemedicine visit I agree to the above.  

## 2018-08-25 ENCOUNTER — Other Ambulatory Visit: Payer: Self-pay | Admitting: Cardiovascular Disease

## 2018-09-02 ENCOUNTER — Other Ambulatory Visit: Payer: Self-pay

## 2018-09-02 ENCOUNTER — Telehealth (INDEPENDENT_AMBULATORY_CARE_PROVIDER_SITE_OTHER): Payer: BC Managed Care – PPO | Admitting: Interventional Cardiology

## 2018-09-02 ENCOUNTER — Encounter: Payer: Self-pay | Admitting: Interventional Cardiology

## 2018-09-02 VITALS — BP 120/76 | Ht 65.0 in | Wt 248.0 lb

## 2018-09-02 DIAGNOSIS — I712 Thoracic aortic aneurysm, without rupture, unspecified: Secondary | ICD-10-CM

## 2018-09-02 DIAGNOSIS — Z9889 Other specified postprocedural states: Secondary | ICD-10-CM | POA: Diagnosis not present

## 2018-09-02 DIAGNOSIS — E782 Mixed hyperlipidemia: Secondary | ICD-10-CM | POA: Diagnosis not present

## 2018-09-02 DIAGNOSIS — I25709 Atherosclerosis of coronary artery bypass graft(s), unspecified, with unspecified angina pectoris: Secondary | ICD-10-CM | POA: Diagnosis not present

## 2018-09-02 DIAGNOSIS — Z7189 Other specified counseling: Secondary | ICD-10-CM

## 2018-09-02 DIAGNOSIS — I119 Hypertensive heart disease without heart failure: Secondary | ICD-10-CM

## 2018-09-02 DIAGNOSIS — I4891 Unspecified atrial fibrillation: Secondary | ICD-10-CM

## 2018-09-02 DIAGNOSIS — M3214 Glomerular disease in systemic lupus erythematosus: Secondary | ICD-10-CM | POA: Diagnosis not present

## 2018-09-02 DIAGNOSIS — I10 Essential (primary) hypertension: Secondary | ICD-10-CM

## 2018-09-02 NOTE — Patient Instructions (Signed)

## 2018-09-02 NOTE — Progress Notes (Signed)
Virtual Visit via Video Note   This visit type was conducted due to national recommendations for restrictions regarding the COVID-19 Pandemic (e.g. social distancing) in an effort to limit this patient's exposure and mitigate transmission in our community.  Due to her co-morbid illnesses, this patient is at least at moderate risk for complications without adequate follow up.  This format is felt to be most appropriate for this patient at this time.  All issues noted in this document were discussed and addressed.  A limited physical exam was performed with this format.  Please refer to the patient's chart for her consent to telehealth for [facility].   Date:  09/02/2018   ID:  [name], DOB [birth year], MRN [id number]  Patient Location: Home Provider Location: Office  PCP:  Janith Lima, MD  Cardiologist:  Sinclair Grooms, MD  Electrophysiologist:  None   Evaluation Performed:  Follow-Up Visit  Chief Complaint:  CAD  History of Present Illness:    [name] is a 63 y.o. female with  with a hx of lupus erythematosus, coronary artery disease with prior history of stenting and ultimate recent four-vessel coronary bypass grafting 2018, mitral valve annuloplasty at the time of CABG in 2018, postoperative atrial fibrillation, PAD, and ascending aortic aneurysm.  Doing well.  No chest pain.  Had an interval CT scan to follow-up on mild aortic enlargement.  Report is noted below.  She denies palpitations.  No transient neurological symptoms.  There is no peripheral edema.  She denies orthopnea and PND.  No medication side effects.   The patient does not have symptoms concerning for COVID-19 infection (fever, chills, cough, or new shortness of breath).    Past Medical History:  Diagnosis Date  . CAD (coronary artery disease)    a. remote stents/angioplasty. b. s/p CABG 01/2017 with mitral valve annuloplasty.  . Esophageal reflux   . GERD (gastroesophageal reflux  disease)   . Hypothyroidism   . Immune thrombocytopenic purpura ([place])   . Lupus erythematosus   . Mitral valve insufficiency and aortic valve insufficiency   . Myocardial infarction ([place])    x 2  . PONV (postoperative nausea and vomiting)   . Postoperative atrial fibrillation ([place])   . Severe mitral regurgitation    a. s/p MV annuloplasty 01/2017 at time of CABG.  . Status post mitral valve annuloplasty   . Unspecified disease of pericardium    Past Surgical History:  Procedure Laterality Date  . CHOLECYSTECTOMY, LAPAROSCOPIC  2010  . CORONARY ARTERY BYPASS GRAFT N/A 01/30/2017   Procedure: CORONARY ARTERY BYPASS GRAFTING (CABG), Times four  using the right saphaneous vein, harvested endoscopicly.  and left internal mammary artery .;  Surgeon: Gaye Pollack, MD;  Location: [facility];  Service: Open Heart Surgery;  Laterality: N/A;  . LEFT HEART CATH AND CORONARY ANGIOGRAPHY N/A 01/23/2017   Procedure: LEFT HEART CATH AND CORONARY ANGIOGRAPHY;  Surgeon: Belva Crome, MD;  Location: [place] [facility];  Service: Cardiovascular;  Laterality: N/A;  . MITRAL VALVE REPAIR N/A 01/30/2017   Procedure: MITRAL VALVE REPAIR (MVR);  Surgeon: Gaye Pollack, MD;  Location: [place];  Service: Open Heart Surgery;  Laterality: N/A;  . plastic surgical repair      dog bite to leg  . SPLENECTOMY  5-04  . TEE WITHOUT CARDIOVERSION N/A 01/27/2017   Procedure: TRANSESOPHAGEAL ECHOCARDIOGRAM (TEE);  Surgeon: Sanda Klein, MD;  Location: [place];  Service: Cardiovascular;  Laterality: N/A;  .  TEE WITHOUT CARDIOVERSION N/A 01/30/2017   Procedure: TRANSESOPHAGEAL ECHOCARDIOGRAM (TEE);  Surgeon: Gaye Pollack, MD;  Location: [place];  Service: Open Heart Surgery;  Laterality: N/A;  . ULTRASOUND GUIDANCE FOR VASCULAR ACCESS  01/23/2017   Procedure: Ultrasound Guidance For Vascular Access;  Surgeon: Belva Crome, MD;  Location: [place] [facility];  Service: Cardiovascular;;     Current Meds  Medication Sig   . amLODipine (NORVASC) 5 MG tablet TAKE 1 TABLET BY MOUTH EVERY DAY  . azaTHIOprine (IMURAN) 50 MG tablet Take 50 mg by mouth 2 (two) times daily.  . hydroxychloroquine (PLAQUENIL) 200 MG tablet Take 200 mg by mouth daily.   Marland Kitchen levothyroxine (SYNTHROID) 75 MCG tablet TAKE 1 TABLET BY MOUTH EVERY DAY  . metoprolol succinate (TOPROL-XL) 50 MG 24 hr tablet Take 1 tablet (50 mg total) by mouth daily. Take with or immediately following a meal.  . pantoprazole (PROTONIX) 40 MG tablet Take 1 tablet (40 mg total) by mouth daily.  . rosuvastatin (CRESTOR) 20 MG tablet TAKE 1 TABLET BY MOUTH EVERY DAY     Allergies:   Patient has no known allergies.   Social History   Tobacco Use  . Smoking status: Former Smoker    Packs/day: 0.50    Years: 30.00    Pack years: 15.00    Types: Cigarettes    Quit date: 12/23/2011    Years since quitting: 6.6  . Smokeless tobacco: Never Used  Substance Use Topics  . Alcohol use: No  . Drug use: No     Family Hx: The patient's family history includes Breast cancer in her mother; Cancer in her mother and paternal grandfather; Fibromyalgia in her mother; GER disease in her father; Heart attack in her maternal grandmother; Heart disease in her father; Hypertension in her father; Paget's disease of bone in her mother.  ROS:   Please see the history of present illness.    She is doing relatively well.  She is working today as we do this office visit.  She has no complaints.  Overall feels well. All other systems reviewed and are negative.   Prior CV studies:   The following studies were reviewed today: Chest CT Angio 08/11/2018: IMPRESSION: 1. Stable uncomplicated mild fusiform ectasia of the ascending thoracic aorta measuring 39 mm in diameter, unchanged compared to the 12/2016 examination. Note, comparison to chest CT performed 2007 is degraded secondary to significant pulsation artifact on that remote examination. Aortic aneurysm NOS (ICD10-I71.9). 2.   Aortic Atherosclerosis (ICD10-I70.0). 3.  Emphysema (ICD10-J43.9). 4. Right apical pleuroparenchymal thickening/scar is unchanged to 2007 examination. 5. Small hiatal hernia. 6. Stable sequela of prior splenectomy with residual splenule within the left upper abdomen.  Labs/Other Tests and Data Reviewed:    EKG:  No ECG reviewed.  Recent Labs: 02/01/2018: ALT 15 02/03/2018: Hemoglobin 13.2; Platelets 182.0; TSH 0.74 08/04/2018: BUN 18; Creatinine, Ser 1.28; Potassium 4.8; Sodium 140   Recent Lipid Panel Lab Results  Component Value Date/Time   CHOL 134 02/03/2018 01:43 PM   CHOL 141 12/02/2017 11:24 AM   TRIG 52.0 02/03/2018 01:43 PM   HDL 55.90 02/03/2018 01:43 PM   HDL 53 12/02/2017 11:24 AM   CHOLHDL 2 02/03/2018 01:43 PM   LDLCALC 68 02/03/2018 01:43 PM   LDLCALC 76 12/02/2017 11:24 AM   LDLDIRECT 321.8 02/27/2012 12:02 PM    Wt Readings from Last 3 Encounters:  09/02/18 248 lb (112.5 kg)  06/18/18 249 lb (112.9 kg)  05/05/18 250  lb 12.8 oz (113.8 kg)     Objective:    Vital Signs:  BP 120/76   Ht 5\' 5"  (1.651 m)   Wt 248 lb (112.5 kg)   BMI 41.27 kg/m    VITAL SIGNS:  reviewed GEN:  no acute distress CARDIOVASCULAR:  no peripheral edema NEURO:  alert and oriented x 3, no obvious focal deficit  ASSESSMENT & PLAN:    1. S/P MVR (mitral valve repair)   2. Coronary artery disease involving coronary bypass graft of native heart with angina pectoris ([place])   3. Thoracic aortic aneurysm without rupture ([place])   4. Essential hypertension   5. Mixed hyperlipidemia   6. Atrial fibrillation, unspecified type ([place])   7. Systemic lupus erythematosus with focal and segmental proliferative glomerulonephritis ([place])   8. Educated About Covid-19 Virus Infection    PLAN:  1. Unable to auscultate 2. No symptoms of angina 3. Stable aortic size documented by CT as noted above.  COVID-19 Education: The signs and symptoms of COVID-19 were discussed with the patient and  how to seek care for testing (follow up with PCP or arrange E-visit).  The importance of social distancing was discussed today.  Time:   Today, I have spent 10 minutes with the patient with telehealth technology discussing the above problems.     Medication Adjustments/Labs and Tests Ordered: Current medicines are reviewed at length with the patient today.  Concerns regarding medicines are outlined above.   Tests Ordered: No orders of the defined types were placed in this encounter.   Medication Changes: No orders of the defined types were placed in this encounter.   Disposition:  Follow up in 1 year(s)  Signed, Sinclair Grooms, MD  09/02/2018 4:26 PM    [facility]

## 2018-10-05 ENCOUNTER — Other Ambulatory Visit: Payer: Self-pay | Admitting: *Deleted

## 2018-10-05 DIAGNOSIS — Z20822 Contact with and (suspected) exposure to covid-19: Secondary | ICD-10-CM

## 2018-10-08 DIAGNOSIS — M3214 Glomerular disease in systemic lupus erythematosus: Secondary | ICD-10-CM | POA: Diagnosis not present

## 2018-10-08 DIAGNOSIS — Z79899 Other long term (current) drug therapy: Secondary | ICD-10-CM | POA: Diagnosis not present

## 2018-10-08 DIAGNOSIS — M329 Systemic lupus erythematosus, unspecified: Secondary | ICD-10-CM | POA: Diagnosis not present

## 2018-10-10 LAB — NOVEL CORONAVIRUS, NAA: SARS-CoV-2, NAA: NOT DETECTED

## 2018-10-12 ENCOUNTER — Other Ambulatory Visit: Payer: Self-pay | Admitting: Internal Medicine

## 2018-10-25 ENCOUNTER — Encounter: Payer: Self-pay | Admitting: Internal Medicine

## 2018-10-25 ENCOUNTER — Other Ambulatory Visit: Payer: Self-pay

## 2018-10-25 ENCOUNTER — Other Ambulatory Visit (INDEPENDENT_AMBULATORY_CARE_PROVIDER_SITE_OTHER): Payer: BC Managed Care – PPO

## 2018-10-25 ENCOUNTER — Ambulatory Visit (INDEPENDENT_AMBULATORY_CARE_PROVIDER_SITE_OTHER): Payer: BC Managed Care – PPO | Admitting: Internal Medicine

## 2018-10-25 VITALS — BP 132/84 | HR 65 | Temp 98.1°F | Resp 16 | Ht 65.0 in | Wt 247.0 lb

## 2018-10-25 DIAGNOSIS — I25709 Atherosclerosis of coronary artery bypass graft(s), unspecified, with unspecified angina pectoris: Secondary | ICD-10-CM | POA: Diagnosis not present

## 2018-10-25 DIAGNOSIS — Z23 Encounter for immunization: Secondary | ICD-10-CM | POA: Diagnosis not present

## 2018-10-25 DIAGNOSIS — D693 Immune thrombocytopenic purpura: Secondary | ICD-10-CM

## 2018-10-25 DIAGNOSIS — E039 Hypothyroidism, unspecified: Secondary | ICD-10-CM

## 2018-10-25 LAB — CBC WITH DIFFERENTIAL/PLATELET
Basophils Absolute: 0 10*3/uL (ref 0.0–0.1)
Basophils Relative: 1.5 % (ref 0.0–3.0)
Eosinophils Absolute: 0.1 10*3/uL (ref 0.0–0.7)
Eosinophils Relative: 5.3 % — ABNORMAL HIGH (ref 0.0–5.0)
HCT: 40.4 % (ref 36.0–46.0)
Hemoglobin: 13.4 g/dL (ref 12.0–15.0)
Lymphocytes Relative: 22.7 % (ref 12.0–46.0)
Lymphs Abs: 0.6 10*3/uL — ABNORMAL LOW (ref 0.7–4.0)
MCHC: 33.2 g/dL (ref 30.0–36.0)
MCV: 101.2 fl — ABNORMAL HIGH (ref 78.0–100.0)
Monocytes Absolute: 0.5 10*3/uL (ref 0.1–1.0)
Monocytes Relative: 16.6 % — ABNORMAL HIGH (ref 3.0–12.0)
Neutro Abs: 1.5 10*3/uL (ref 1.4–7.7)
Neutrophils Relative %: 53.9 % (ref 43.0–77.0)
Platelets: 203 10*3/uL (ref 150.0–400.0)
RBC: 3.99 Mil/uL (ref 3.87–5.11)
RDW: 15 % (ref 11.5–15.5)
WBC: 2.8 10*3/uL — ABNORMAL LOW (ref 4.0–10.5)

## 2018-10-25 LAB — TSH: TSH: 0.57 u[IU]/mL (ref 0.35–4.50)

## 2018-10-25 NOTE — Progress Notes (Signed)
Subjective:  Patient ID: [name], female    DOB: [birth year]  Age: 63 y.o. MRN: [id number]  CC: Hypothyroidism   HPI [name] presents for f/up - She complains of chronic, unchanged fatigue and constipation.  She otherwise feels well and offers no other complaints.  Outpatient Medications Prior to Visit  Medication Sig Dispense Refill   amLODipine (NORVASC) 5 MG tablet TAKE 1 TABLET BY MOUTH EVERY DAY 90 tablet 3   azaTHIOprine (IMURAN) 50 MG tablet Take 50 mg by mouth 2 (two) times daily.     hydroxychloroquine (PLAQUENIL) 200 MG tablet Take 200 mg by mouth daily.      pantoprazole (PROTONIX) 40 MG tablet Take 1 tablet (40 mg total) by mouth daily. 90 tablet 1   rosuvastatin (CRESTOR) 20 MG tablet TAKE 1 TABLET BY MOUTH EVERY DAY 90 tablet 1   levothyroxine (SYNTHROID) 75 MCG tablet TAKE 1 TABLET BY MOUTH EVERY DAY 90 tablet 0   metoprolol succinate (TOPROL-XL) 50 MG 24 hr tablet Take 1 tablet (50 mg total) by mouth daily. Take with or immediately following a meal. 90 tablet 3   No facility-administered medications prior to visit.     ROS Review of Systems  Constitutional: Positive for fatigue. Negative for diaphoresis and unexpected weight change.  HENT: Negative.  Negative for nosebleeds.   Respiratory: Negative for cough, chest tightness, shortness of breath and wheezing.   Cardiovascular: Negative for chest pain, palpitations and leg swelling.  Gastrointestinal: Positive for constipation. Negative for abdominal distention, abdominal pain, diarrhea, nausea and vomiting.  Endocrine: Negative for cold intolerance and heat intolerance.  Genitourinary: Negative.  Negative for difficulty urinating and hematuria.  Musculoskeletal: Negative.  Negative for arthralgias and myalgias.  Skin: Negative.  Negative for color change and pallor.  Neurological: Negative.  Negative for dizziness, weakness, light-headedness and headaches.  Hematological: Negative for  adenopathy. Does not bruise/bleed easily.  Psychiatric/Behavioral: Negative.     Objective:  BP 132/84 (BP Location: Left Arm, Patient Position: Sitting, Cuff Size: Large)    Pulse 65    Temp 98.1 F (36.7 C) (Oral)    Resp 16    Ht 5\' 5"  (1.651 m)    Wt 247 lb (112 kg)    SpO2 98%    BMI 41.10 kg/m   BP Readings from Last 3 Encounters:  10/25/18 132/84  09/02/18 120/76  06/18/18 120/78    Wt Readings from Last 3 Encounters:  10/25/18 247 lb (112 kg)  09/02/18 248 lb (112.5 kg)  06/18/18 249 lb (112.9 kg)    Physical Exam Vitals signs reviewed.  Constitutional:      Appearance: She is obese. She is not ill-appearing or diaphoretic.  HENT:     Nose: Nose normal. No congestion.     Mouth/Throat:     Pharynx: Oropharynx is clear. No oropharyngeal exudate.  Eyes:     General: No scleral icterus.    Conjunctiva/sclera: Conjunctivae normal.  Neck:     Musculoskeletal: Normal range of motion. No neck rigidity.  Cardiovascular:     Rate and Rhythm: Normal rate and regular rhythm.     Heart sounds: No murmur.  Pulmonary:     Effort: Pulmonary effort is normal. No respiratory distress.     Breath sounds: No stridor. No wheezing, rhonchi or rales.  Abdominal:     General: Abdomen is protuberant. Bowel sounds are normal. There is no distension.     Palpations: There is no hepatomegaly, splenomegaly or mass.  Tenderness: There is no abdominal tenderness.  Musculoskeletal: Normal range of motion.        General: No swelling.     Right lower leg: No edema.     Left lower leg: No edema.  Lymphadenopathy:     Cervical: No cervical adenopathy.  Skin:    General: Skin is warm and dry.     Coloration: Skin is not pale.  Neurological:     General: No focal deficit present.     Mental Status: She is oriented to person, place, and time.  Psychiatric:        Mood and Affect: Mood normal.        Behavior: Behavior normal.     Lab Results  Component Value Date   WBC 2.8 (L)  10/25/2018   HGB 13.4 10/25/2018   HCT 40.4 10/25/2018   PLT 203.0 10/25/2018   GLUCOSE 88 08/04/2018   CHOL 134 02/03/2018   TRIG 52.0 02/03/2018   HDL 55.90 02/03/2018   LDLDIRECT 321.8 02/27/2012   LDLCALC 68 02/03/2018   ALT 15 02/01/2018   AST 27 02/01/2018   NA 140 08/04/2018   K 4.8 08/04/2018   CL 102 08/04/2018   CREATININE 1.28 (H) 08/04/2018   BUN 18 08/04/2018   CO2 24 08/04/2018   TSH 0.57 10/25/2018   INR 1.09 07/01/2017   HGBA1C 5.6 01/31/2017    Ct Angio Chest Aorta W &/or Wo Contrast  Result Date: 08/11/2018 CLINICAL DATA:  Evaluate thoracic aortic aneurysm. EXAM: CT ANGIOGRAPHY CHEST WITH CONTRAST TECHNIQUE: Multidetector CT imaging of the chest was performed using the standard protocol during bolus administration of intravenous contrast. Multiplanar CT image reconstructions and MIPs were obtained to evaluate the vascular anatomy. CONTRAST:  135mL OMNIPAQUE IOHEXOL 350 MG/ML SOLN COMPARISON:  02/01/2018; 01/17/2017; 05/28/2005 FINDINGS: Vascular Findings: Grossly unchanged mild fusiform ectasia of the ascending thoracic aorta with measurements as follows. The thoracic aorta tapers to a normal caliber at the level of the aortic arch. There is a minimal amount of mixed calcified and noncalcified atherosclerotic plaque within the aortic arch and descending thoracic aorta, not resulting in a hemodynamically significant stenosis. No aortic dissection or periaortic stranding on this nongated examination. Bovine configuration of the aortic arch. The branch vessels of the aortic arch are tortuous though appear patent throughout their imaged courses. Note is made of a mild ductus diverticulum. The descending thoracic aorta is otherwise of normal caliber. Cardiomegaly. Post median sternotomy, CABG and mitral valve replacement. Coronary artery calcifications. No pericardial effusion. Although this examination was not tailored for the evaluation the pulmonary arteries, there are no  discrete filling defects within the central pulmonary arterial tree to suggest central pulmonary embolism. Normal caliber of the main pulmonary artery. ------------------------------------------------------------- Thoracic aortic measurements: Sinotubular junction 30 mm as measured in greatest oblique short axis coronal dimension. Proximal ascending aorta 39 mm as measured in greatest oblique short axis axial dimension at the level of the main pulmonary artery (axial image 30, series 4) and approximately 38 mm in greatest oblique short axis coronal diameter (coronal image 50, series 7), unchanged compared to the 12/2016 examination. Aortic arch aorta 28 mm as measured in greatest oblique short axis sagittal dimension. Proximal descending thoracic aorta 29 mm as measured in greatest oblique short axis axial dimension at the level of the main pulmonary artery. Distal descending thoracic aorta 22 mm as measured in greatest oblique short axis axial dimension at the level of the diaphragmatic hiatus. Review of the MIP images  confirms the above findings. ------------------------------------------------------------- Non-Vascular Findings: Mediastinum/Lymph Nodes: No bulky mediastinal, hilar or axillary lymphadenopathy. Lungs/Pleura: Mild apical predominant centrilobular emphysematous change. Asymmetric right apical pleuroparenchymal thickening/scar is unchanged compared to the 2007 examination. Minimal dependent subpleural ground-glass atelectasis. No discrete focal airspace opacities. No pleural effusion or pneumothorax. No discrete pulmonary nodules. Upper abdomen: Limited early arterial phase evaluation of the upper abdomen demonstrates a small hiatal hernia. Post splenectomy with tiny residual splenule within the left upper abdominal quadrant. Post cholecystectomy. Punctate granuloma within the subcapsular aspect of the medial segment of the left lobe of the liver. Musculoskeletal: No acute or aggressive osseous  abnormalities. IMPRESSION: 1. Stable uncomplicated mild fusiform ectasia of the ascending thoracic aorta measuring 39 mm in diameter, unchanged compared to the 12/2016 examination. Note, comparison to chest CT performed 2007 is degraded secondary to significant pulsation artifact on that remote examination. Aortic aneurysm NOS (ICD10-I71.9). 2.  Aortic Atherosclerosis (ICD10-I70.0). 3.  Emphysema (ICD10-J43.9). 4. Right apical pleuroparenchymal thickening/scar is unchanged to 2007 examination. 5. Small hiatal hernia. 6. Stable sequela of prior splenectomy with residual splenule within the left upper abdomen. Electronically Signed   By: Sandi Mariscal M.D.   On: 08/11/2018 10:28    Assessment & Plan:   Heike was seen today for hypothyroidism.  Diagnoses and all orders for this visit:  Immune thrombocytopenic purpura ([place])- She has had no bleeding complications and her platelet count is normal.  Will continue to monitor this. -     CBC with Differential/Platelet; Future  Acquired hypothyroidism- Her TSH is in the normal range.  She will stay on the current dose of levothyroxine. -     TSH; Future -     levothyroxine (SYNTHROID) 75 MCG tablet; Take 1 tablet (75 mcg total) by mouth daily.  Need for pneumococcal vaccination -     Pneumococcal polysaccharide vaccine 23-valent greater than or equal to 2yo subcutaneous/IM   I have changed Yarieliz F. Brewton's levothyroxine. I am also having her maintain her azaTHIOprine, hydroxychloroquine, amLODipine, metoprolol succinate, pantoprazole, and rosuvastatin.  Meds ordered this encounter  Medications   levothyroxine (SYNTHROID) 75 MCG tablet    Sig: Take 1 tablet (75 mcg total) by mouth daily.    Dispense:  90 tablet    Refill:  1     Follow-up: Return in about 4 months (around 02/24/2019).  Scarlette Calico, MD

## 2018-10-25 NOTE — Patient Instructions (Signed)
Hypothyroidism  Hypothyroidism is when the thyroid gland does not make enough of certain hormones (it is underactive). The thyroid gland is a small gland located in the lower front part of the neck, just in front of the windpipe (trachea). This gland makes hormones that help control how the body uses food for energy (metabolism) as well as how the heart and brain function. These hormones also play a role in keeping your bones strong. When the thyroid is underactive, it produces too little of the hormones thyroxine (T4) and triiodothyronine (T3). What are the causes? This condition may be caused by:  Hashimoto's disease. This is a disease in which the body's disease-fighting system (immune system) attacks the thyroid gland. This is the most common cause.  Viral infections.  Pregnancy.  Certain medicines.  Birth defects.  Past radiation treatments to the head or neck for cancer.  Past treatment with radioactive iodine.  Past exposure to radiation in the environment.  Past surgical removal of part or all of the thyroid.  Problems with a gland in the center of the brain (pituitary gland).  Lack of enough iodine in the diet. What increases the risk? You are more likely to develop this condition if:  You are female.  You have a family history of thyroid conditions.  You use a medicine called lithium.  You take medicines that affect the immune system (immunosuppressants). What are the signs or symptoms? Symptoms of this condition include:  Feeling as though you have no energy (lethargy).  Not being able to tolerate cold.  Weight gain that is not explained by a change in diet or exercise habits.  Lack of appetite.  Dry skin.  Coarse hair.  Menstrual irregularity.  Slowing of thought processes.  Constipation.  Sadness or depression. How is this diagnosed? This condition may be diagnosed based on:  Your symptoms, your medical history, and a physical exam.  Blood  tests. You may also have imaging tests, such as an ultrasound or MRI. How is this treated? This condition is treated with medicine that replaces the thyroid hormones that your body does not make. After you begin treatment, it may take several weeks for symptoms to go away. Follow these instructions at home:  Take over-the-counter and prescription medicines only as told by your health care provider.  If you start taking any new medicines, tell your health care provider.  Keep all follow-up visits as told by your health care provider. This is important. ? As your condition improves, your dosage of thyroid hormone medicine may change. ? You will need to have blood tests regularly so that your health care provider can monitor your condition. Contact a health care provider if:  Your symptoms do not get better with treatment.  You are taking thyroid replacement medicine and you: ? Sweat a lot. ? Have tremors. ? Feel anxious. ? Lose weight rapidly. ? Cannot tolerate heat. ? Have emotional swings. ? Have diarrhea. ? Feel weak. Get help right away if you have:  Chest pain.  An irregular heartbeat.  A rapid heartbeat.  Difficulty breathing. Summary  Hypothyroidism is when the thyroid gland does not make enough of certain hormones (it is underactive).  When the thyroid is underactive, it produces too little of the hormones thyroxine (T4) and triiodothyronine (T3).  The most common cause is Hashimoto's disease, a disease in which the body's disease-fighting system (immune system) attacks the thyroid gland. The condition can also be caused by viral infections, medicine, pregnancy, or past   radiation treatment to the head or neck.  Symptoms may include weight gain, dry skin, constipation, feeling as though you do not have energy, and not being able to tolerate cold.  This condition is treated with medicine to replace the thyroid hormones that your body does not make. This information  is not intended to replace advice given to you by your health care provider. Make sure you discuss any questions you have with your health care provider. Document Released: 03/10/2005 Document Revised: 02/20/2017 Document Reviewed: 02/18/2017 Elsevier Patient Education  2020 [employer]  

## 2018-10-26 ENCOUNTER — Encounter: Payer: Self-pay | Admitting: Internal Medicine

## 2018-10-26 MED ORDER — LEVOTHYROXINE SODIUM 75 MCG PO TABS: 75.0000 ug | ORAL_TABLET | Freq: Every day | ORAL | 1 refills | Status: DC

## 2018-12-09 ENCOUNTER — Ambulatory Visit (INDEPENDENT_AMBULATORY_CARE_PROVIDER_SITE_OTHER): Payer: Medicare Other

## 2018-12-09 ENCOUNTER — Other Ambulatory Visit: Payer: Self-pay

## 2018-12-09 DIAGNOSIS — Z23 Encounter for immunization: Secondary | ICD-10-CM | POA: Diagnosis not present

## 2018-12-09 IMAGING — CR DG CHEST 2V
2 series · 2 of 2 positions shown · non-contrast
Comparison: Chest x-ray dated March 08, 2016.

CLINICAL DATA: Chest pain.

EXAM:
CHEST  2 VIEW

[w chest pa]
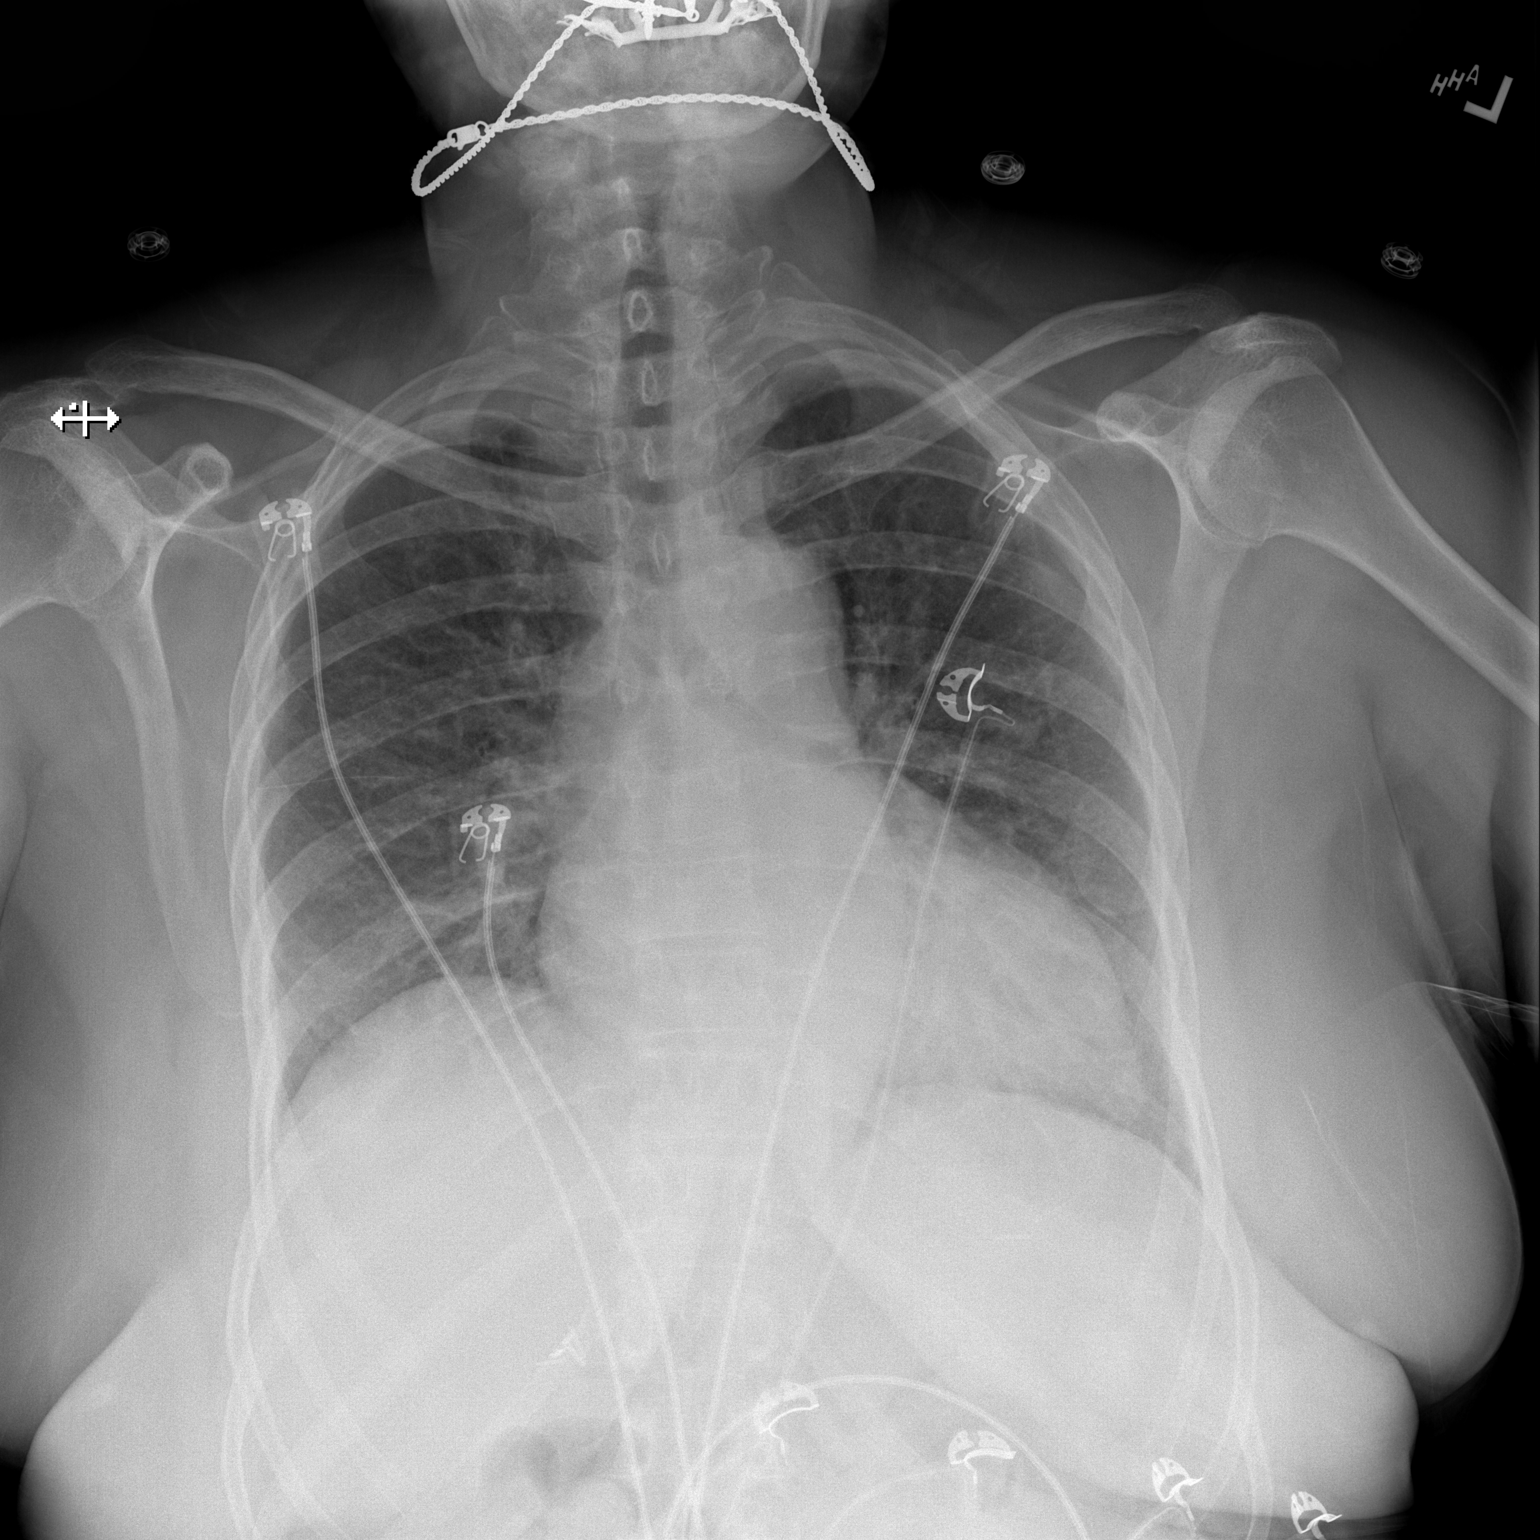

[w chest lat]
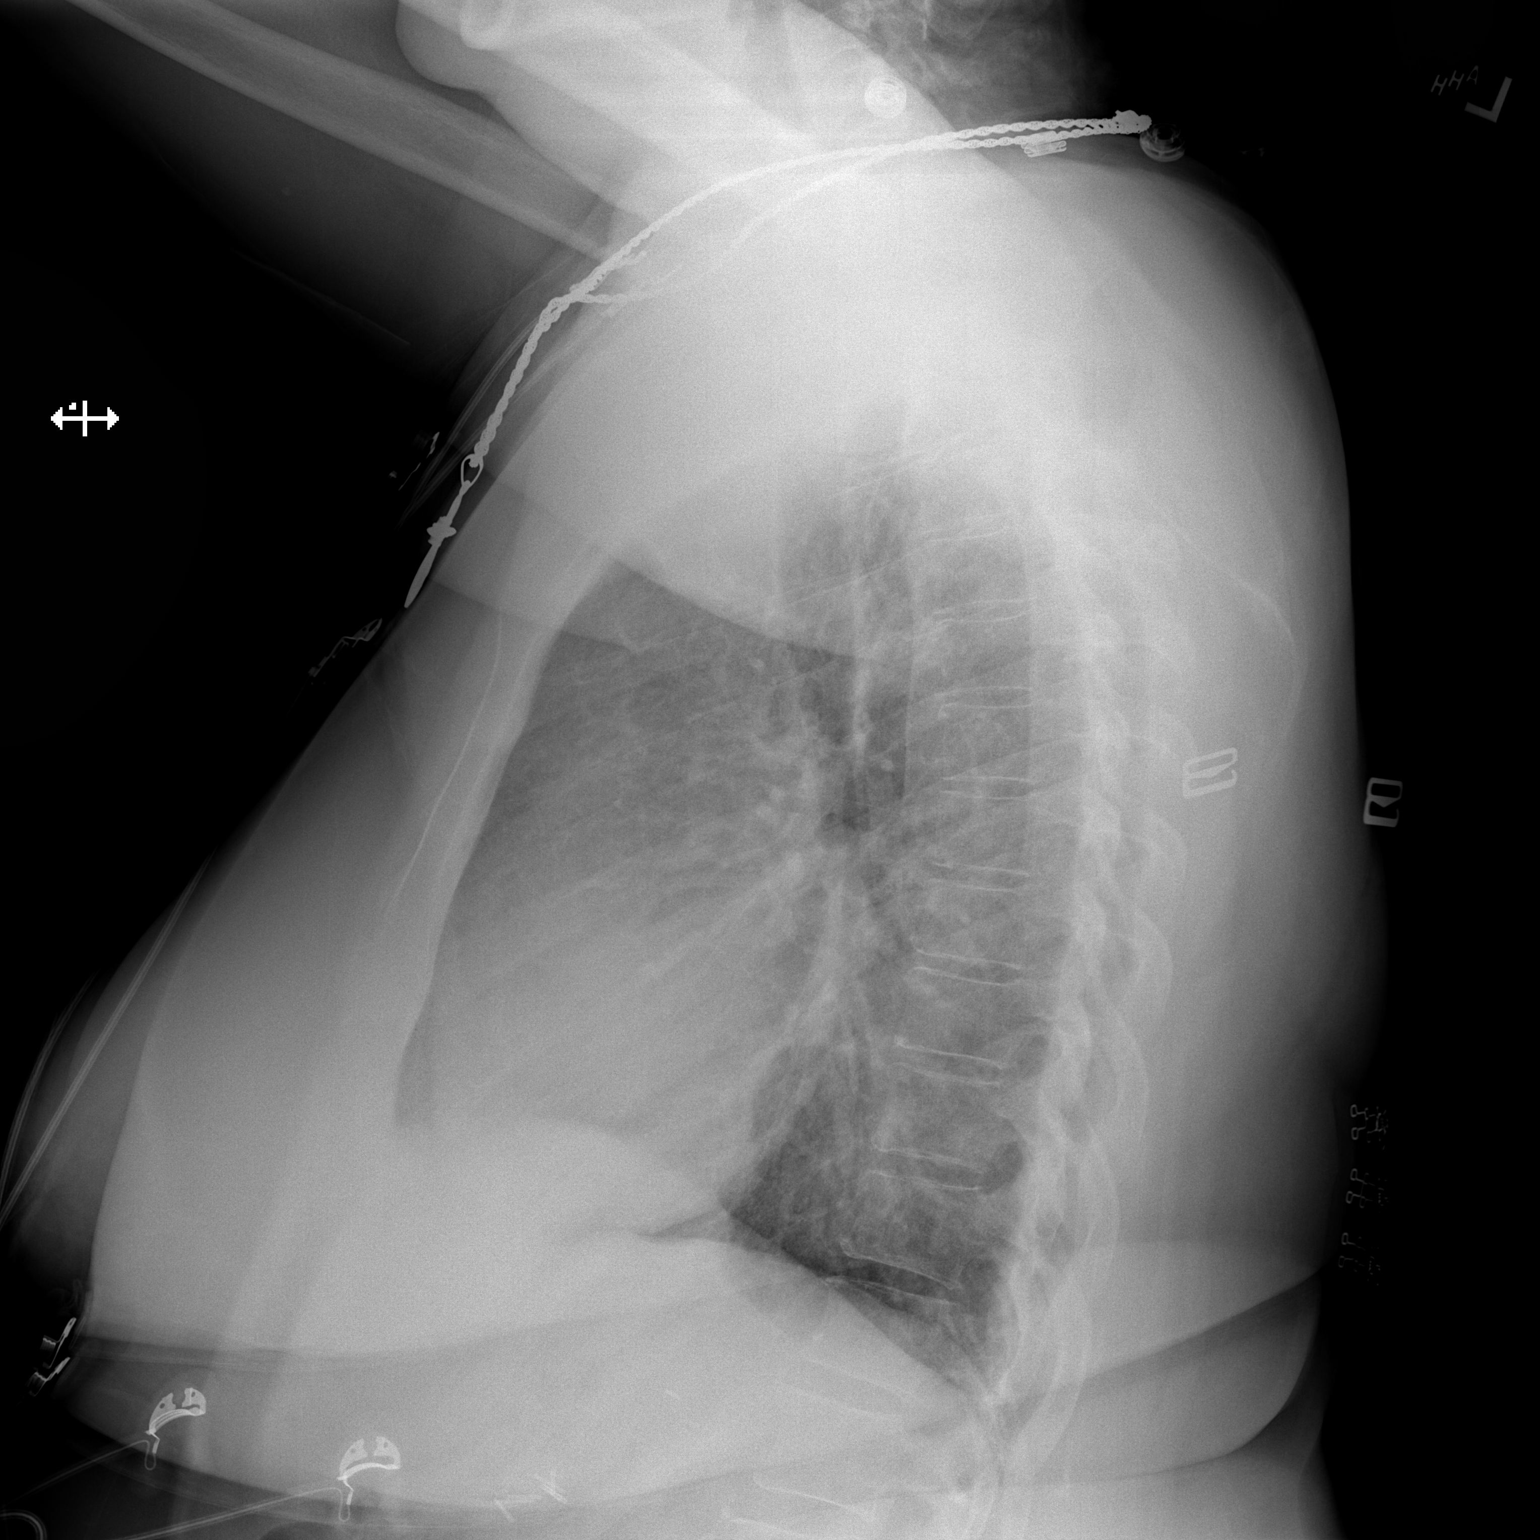

[2 of 2 positions shown; findings below may reference images not displayed]

FINDINGS: Mild cardiomegaly. Normal pulmonary vascularity. No focal
consolidation, pleural effusion, or pneumothorax. No acute osseous
abnormality.
IMPRESSION: Mild cardiomegaly.  No active cardiopulmonary disease.

## 2018-12-09 IMAGING — CT CT ANGIO CHEST-ABD-PELV FOR DISSECTION W/ AND WO/W CM
2 of 7 series · 14 of 46 positions shown, 16 images · IV contrast (ISOVUE 370)
Comparison: CT abdomen dated 03/09/2016. CT chest dated 05/28/2005.

CLINICAL DATA: Per pt, states face, neck, shoulder and chest pain
that started 2 days ago-states history of MI-states dizzy and
nauseated. History of Lupus

EXAM:
CT ANGIOGRAPHY CHEST, ABDOMEN AND PELVIS
TECHNIQUE: Multidetector CT imaging through the chest, abdomen and pelvis was
performed using the standard protocol during bolus administration of
intravenous contrast. Multiplanar reconstructed images and MIPs were
obtained and reviewed to evaluate the vascular anatomy.
CONTRAST:  100 cc Isovue 370

[Series 5: arterial 3.0 b30f · axial · arterial · 0.88mm/px · z∈[-624,-105]mm · 11 of 205 slices shown, 13 images]
[im 16/205  soft-tissue]
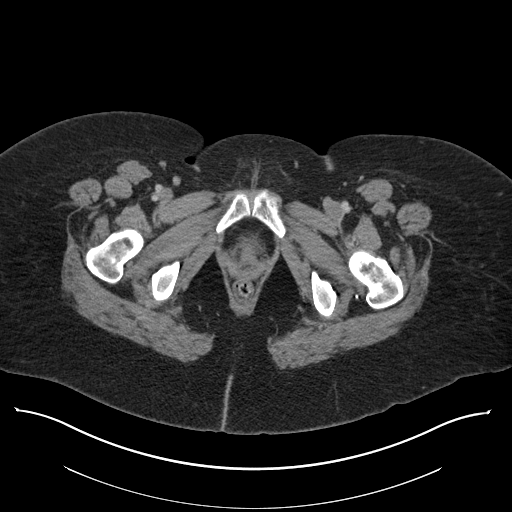
[im 16/205  bone]
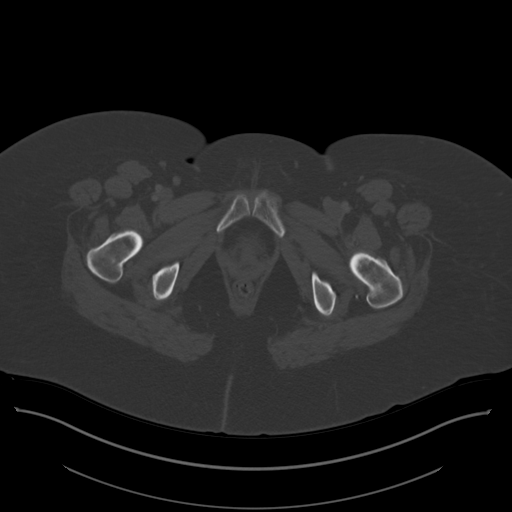
[im 32/205  soft-tissue]
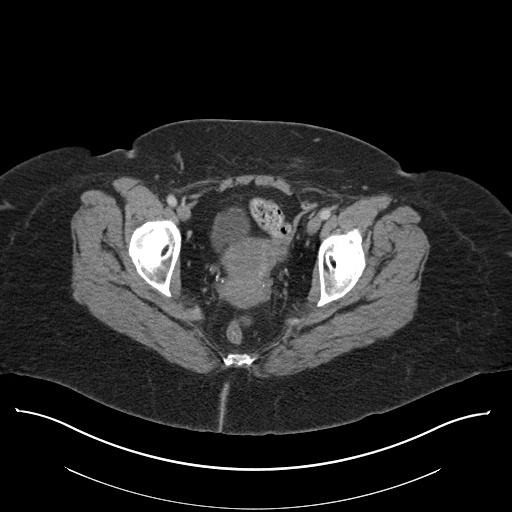
[im 48/205  soft-tissue]
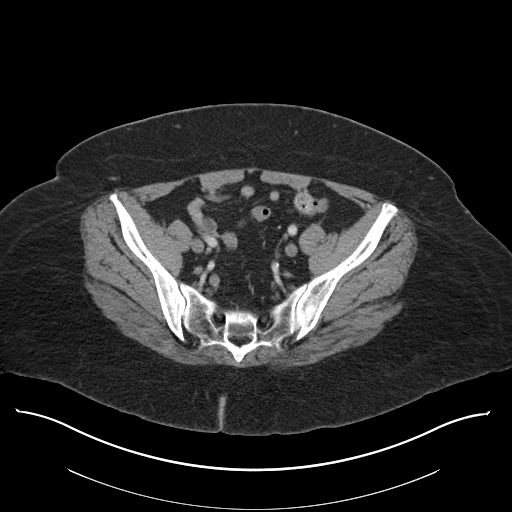
[im 63/205  soft-tissue]
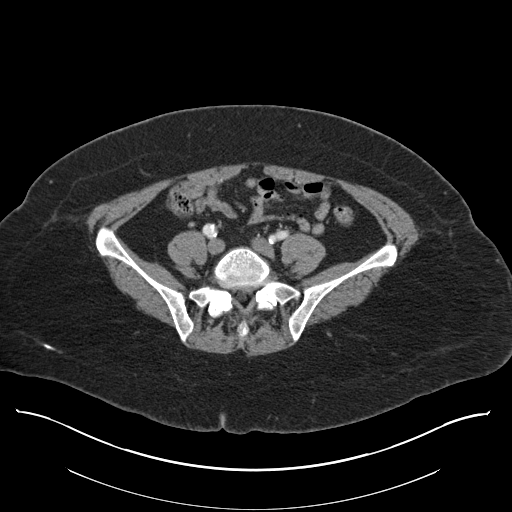
[im 79/205  soft-tissue]
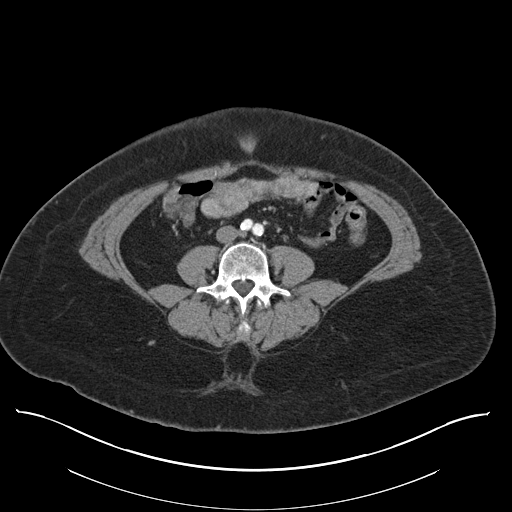
[im 110/205  soft-tissue]
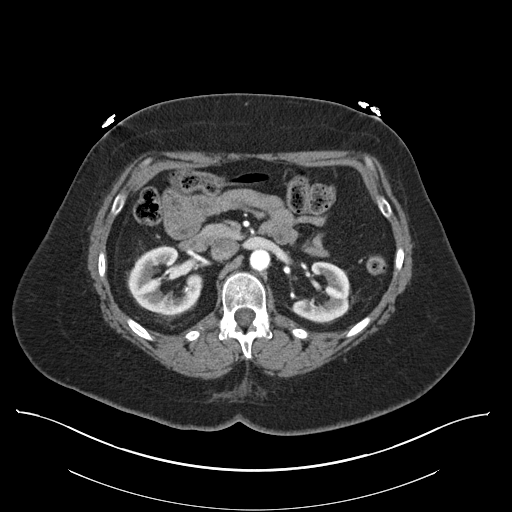
[im 126/205  soft-tissue]
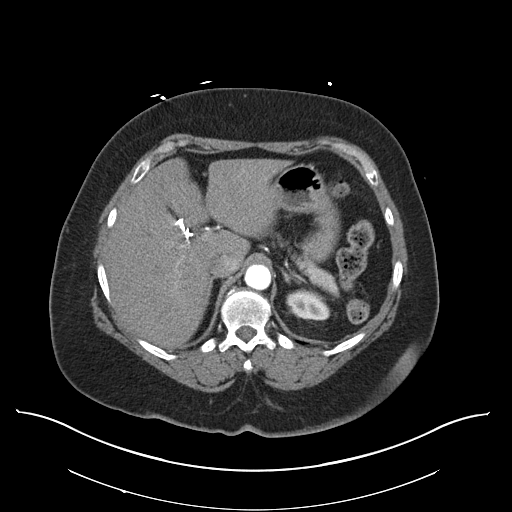
[im 142/205  soft-tissue]
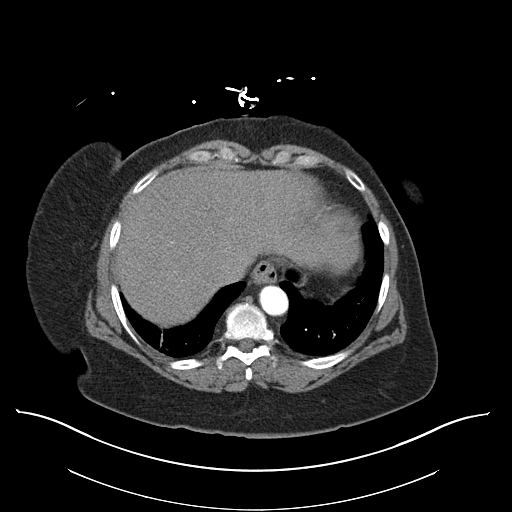
[im 157/205  soft-tissue]
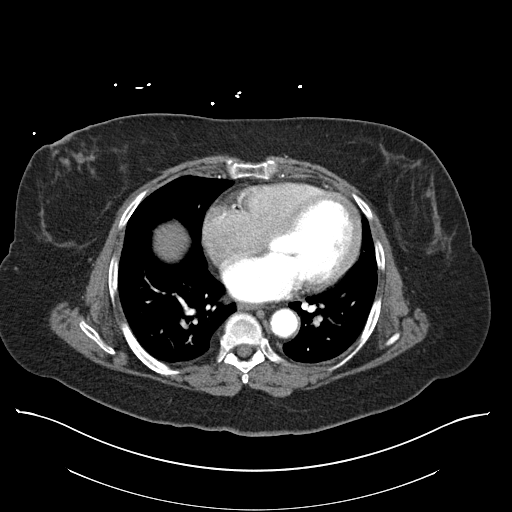
[im 157/205  bone]
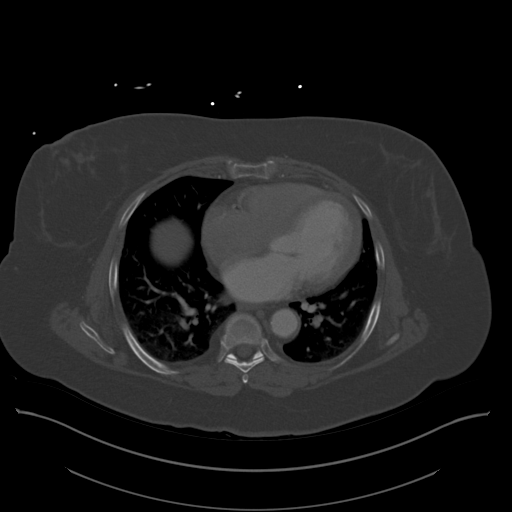
[im 173/205  soft-tissue]
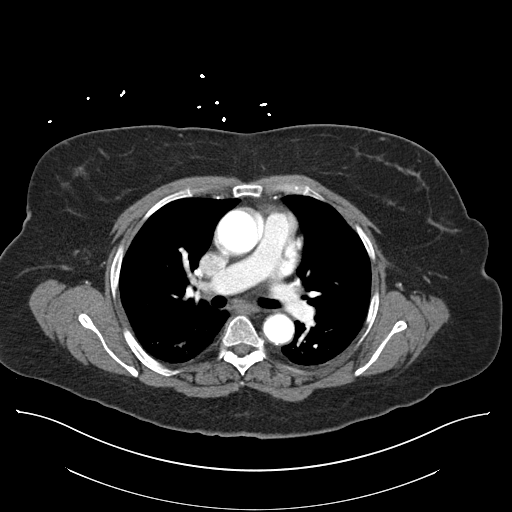
[im 189/205  soft-tissue]
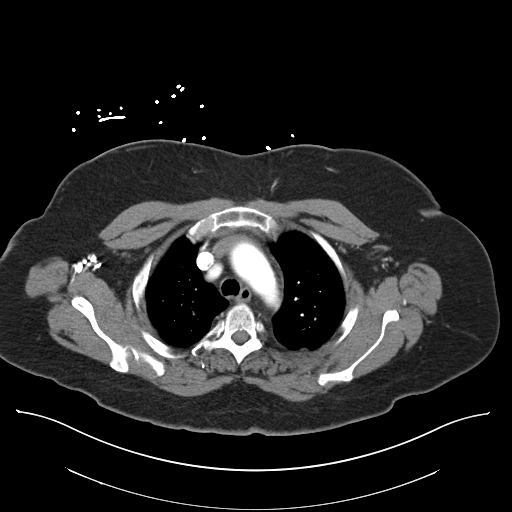

[Series 8: coronal mpr · coronal · 0.90mm/px · 3 of 145 slices shown]
[im 37/145  soft-tissue]
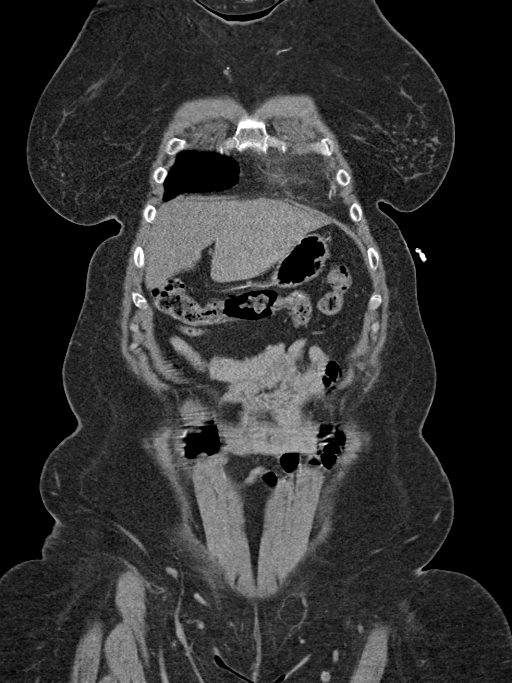
[im 73/145  soft-tissue]
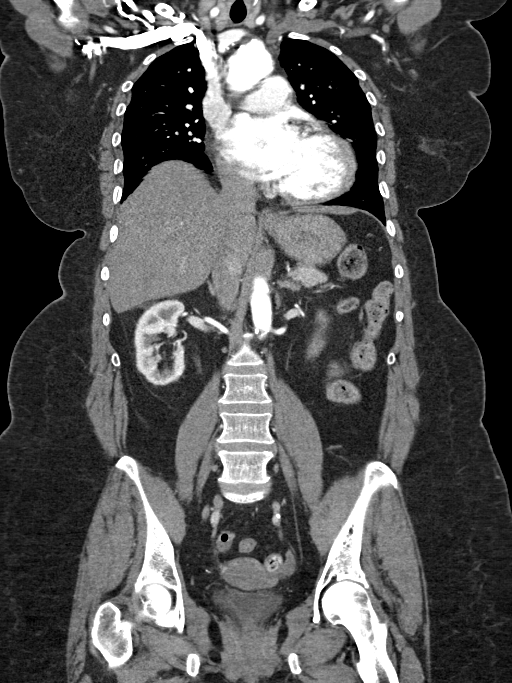
[im 109/145  soft-tissue]
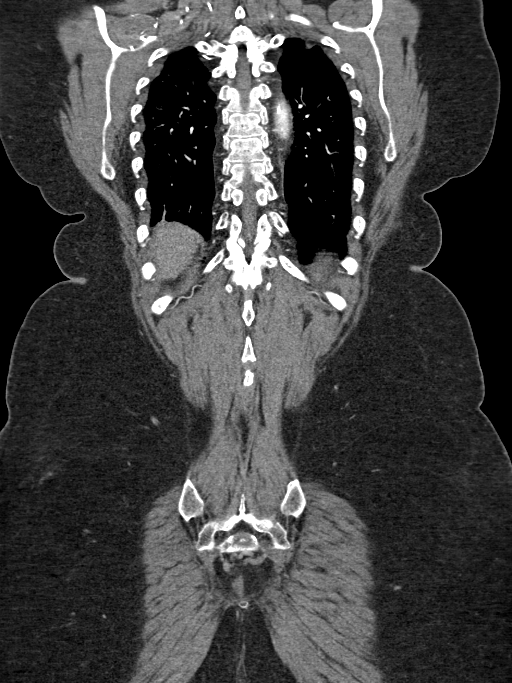

[14 of 46 positions shown; findings below may reference images not displayed]

FINDINGS: CTA CHEST FINDINGS

Cardiovascular: Mild aortic atherosclerosis. No thoracic aortic
aneurysm or dissection. No evidence of ulcerated plaque. Heart size
is within normal limits. No pericardial effusion. No central
obstructing pulmonary embolism seen.

Mediastinum/Nodes: Esophagus is unremarkable. No mass or enlarged
lymph nodes seen within the mediastinum or perihilar regions.
Trachea and central bronchi are unremarkable.

Lungs/Pleura: Stable scarring/fibrosis at the right lung apex. Mild
scarring/atelectasis at each lung base. At least some degree of air
trapping bilaterally. No evidence of pneumonia. No pleural effusion
or pneumothorax.

Musculoskeletal: No acute or suspicious osseous finding. No
significant degenerative change appreciated within the thoracic
spine.

Review of the MIP images confirms the above findings.

CTA ABDOMEN AND PELVIS FINDINGS

VASCULAR

Aorta: Aortic atherosclerosis.  No aortic aneurysm or dissection.

Celiac: Patent without evidence of aneurysm, dissection, vasculitis
or significant stenosis.

SMA: Approximately 50% narrowing at the origin of the SMA, most
likely related to noncalcified atherosclerotic plaque at the origin,
less likely focal eccentric dissection. Normal flow within the
distal SMA.

Renals: Both renal arteries are patent without evidence of aneurysm,
dissection, vasculitis, fibromuscular dysplasia or significant
stenosis.

IMA: Patent without evidence of aneurysm, dissection, vasculitis or
significant stenosis.

Inflow: Patent without evidence of aneurysm, dissection, vasculitis
or significant stenosis.

Veins: No obvious venous abnormality within the limitations of this
arterial phase study.

Review of the MIP images confirms the above findings.

NON-VASCULAR

Hepatobiliary: No focal liver abnormality is seen. Status post
cholecystectomy. No biliary dilatation.

Pancreas: Unremarkable. No pancreatic ductal dilatation or
surrounding inflammatory changes.

Spleen: Status post splenectomy. Small residual splenic remnant
within the left upper quadrant.

Adrenals/Urinary Tract: Adrenal glands appear normal. Kidneys are
unremarkable without mass, stone or hydronephrosis. No renal or
ureteral calculi. Bladder appears normal, partially decompressed.

Stomach/Bowel: Bowel is normal in caliber. No bowel wall thickening
or evidence of bowel wall inflammation seen. Appendix is normal.
Stomach appears normal.

Lymphatic: No significant vascular findings are present. No enlarged
abdominal or pelvic lymph nodes.

Reproductive: Uterus and bilateral adnexa are unremarkable.

Other: No free fluid or abscess collection. No free intraperitoneal
air.

Musculoskeletal: Mild degenerative change within the lumbar spine.
No acute or suspicious osseous finding. Small left inguinal hernia
which contains fat only. Superficial soft tissues are otherwise
unremarkable.

Review of the MIP images confirms the above findings.
IMPRESSION: 1. No acute intrathoracic abnormality. No thoracic aortic aneurysm
or dissection. Heart size is normal. No pericardial effusion. No
pneumonia or pulmonary edema.
2. Approximately 50% narrowing at the origin of the superior
mesenteric artery (SMA), most likely due to chronic noncalcified
atherosclerotic plaque at the origin, focal eccentric dissection is
considered much less likely. Normal contrast flow is seen into the
mid and distal SMA. Consider vascular surgery consultation for
further workup and/or follow-up considerations. No other significant
vascular findings. No vascular obstruction or convincing dissection.
3. No acute appearing findings within the abdomen or pelvis.
Chronic/incidental findings detailed above.
4. Aortic atherosclerosis.

## 2018-12-21 ENCOUNTER — Encounter: Payer: Self-pay | Admitting: Gynecology

## 2018-12-22 IMAGING — DX DG CHEST 1V PORT
1 series · 1 of 1 positions shown · non-contrast
Comparison: 01/17/2017

CLINICAL DATA: Status post coronary bypass grafting

EXAM:
PORTABLE CHEST 1 VIEW

[chest ap]
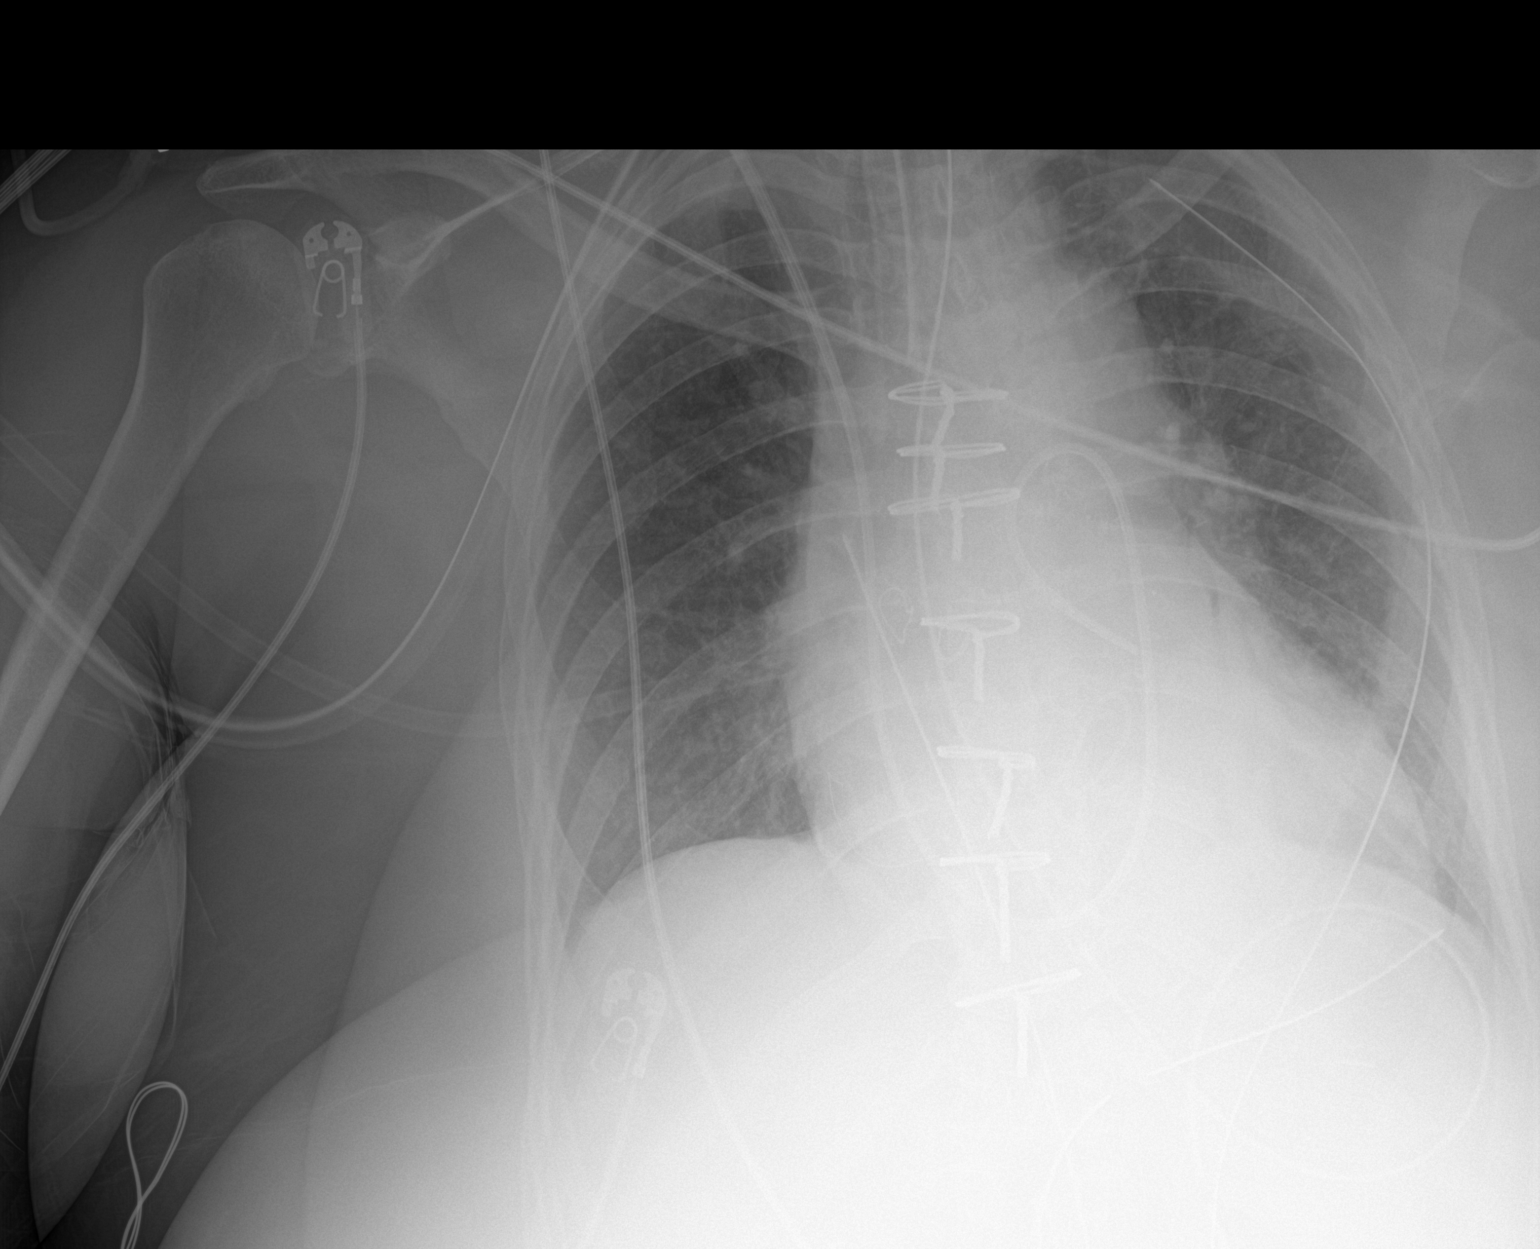

[1 of 1 positions shown; findings below may reference images not displayed]

FINDINGS: Cardiac shadow remains enlarged. Postsurgical changes are now seen.
Endotracheal tube, nasogastric catheter, mediastinal drain,
pericardial drain and left thoracostomy tube are all noted in
satisfactory position. A Swan-Ganz catheter is noted from the right
jugular approach coiled within the pulmonary outflow tract.
Repositioning should be considered. The lungs are clear without
pneumothorax or focal infiltrate. No bony abnormality is seen.
IMPRESSION: Tubes and lines as described above. The Swan-Ganz catheter is coiled
within the pulmonary outflow tract and should be repositioned as
necessary.

No acute postsurgical abnormality is noted.

## 2019-01-12 ENCOUNTER — Other Ambulatory Visit: Payer: Self-pay

## 2019-01-12 DIAGNOSIS — Z20822 Contact with and (suspected) exposure to covid-19: Secondary | ICD-10-CM

## 2019-01-14 LAB — NOVEL CORONAVIRUS, NAA: SARS-CoV-2, NAA: NOT DETECTED

## 2019-01-17 DIAGNOSIS — Z79899 Other long term (current) drug therapy: Secondary | ICD-10-CM | POA: Diagnosis not present

## 2019-01-17 DIAGNOSIS — M329 Systemic lupus erythematosus, unspecified: Secondary | ICD-10-CM | POA: Diagnosis not present

## 2019-01-17 DIAGNOSIS — M3214 Glomerular disease in systemic lupus erythematosus: Secondary | ICD-10-CM | POA: Diagnosis not present

## 2019-02-25 ENCOUNTER — Other Ambulatory Visit: Payer: Self-pay | Admitting: Interventional Cardiology

## 2019-03-12 ENCOUNTER — Other Ambulatory Visit: Payer: Self-pay | Admitting: Interventional Cardiology

## 2019-03-31 ENCOUNTER — Telehealth: Payer: Self-pay

## 2019-03-31 NOTE — Telephone Encounter (Signed)
Copied from [place] [id number]. Topic: General - Inquiry >> Mar 31, 2019  9:05 AM Percell Belt A wrote: Reason for CRM: pt called in and stated she would like to know if Dr Waynard Reeds would suggest her to get the covid vaccine?  She stated that she has really bad immune system and she is not sure if she should get it  Best number [phone] Her appt is at 2 pm to get this shot.

## 2019-03-31 NOTE — Telephone Encounter (Signed)
Pt contacted and informed of same.  

## 2019-04-22 ENCOUNTER — Other Ambulatory Visit: Payer: Self-pay | Admitting: Internal Medicine

## 2019-04-22 DIAGNOSIS — M329 Systemic lupus erythematosus, unspecified: Secondary | ICD-10-CM | POA: Diagnosis not present

## 2019-04-22 DIAGNOSIS — Z79899 Other long term (current) drug therapy: Secondary | ICD-10-CM | POA: Diagnosis not present

## 2019-04-22 DIAGNOSIS — M3214 Glomerular disease in systemic lupus erythematosus: Secondary | ICD-10-CM | POA: Diagnosis not present

## 2019-04-22 DIAGNOSIS — E039 Hypothyroidism, unspecified: Secondary | ICD-10-CM

## 2019-05-06 DIAGNOSIS — M329 Systemic lupus erythematosus, unspecified: Secondary | ICD-10-CM | POA: Diagnosis not present

## 2019-05-08 ENCOUNTER — Other Ambulatory Visit: Payer: Self-pay | Admitting: Interventional Cardiology

## 2019-05-26 ENCOUNTER — Other Ambulatory Visit: Payer: Self-pay | Admitting: Interventional Cardiology

## 2019-07-22 DIAGNOSIS — M329 Systemic lupus erythematosus, unspecified: Secondary | ICD-10-CM | POA: Diagnosis not present

## 2019-07-22 DIAGNOSIS — Z79899 Other long term (current) drug therapy: Secondary | ICD-10-CM | POA: Diagnosis not present

## 2019-07-22 DIAGNOSIS — Z6841 Body Mass Index (BMI) 40.0 and over, adult: Secondary | ICD-10-CM | POA: Diagnosis not present

## 2019-07-22 DIAGNOSIS — M3214 Glomerular disease in systemic lupus erythematosus: Secondary | ICD-10-CM | POA: Diagnosis not present

## 2019-08-19 ENCOUNTER — Other Ambulatory Visit: Payer: Self-pay | Admitting: Interventional Cardiology

## 2019-08-20 ENCOUNTER — Other Ambulatory Visit: Payer: Self-pay | Admitting: Interventional Cardiology

## 2019-08-23 ENCOUNTER — Other Ambulatory Visit: Payer: Self-pay | Admitting: Internal Medicine

## 2019-08-23 DIAGNOSIS — N63 Unspecified lump in unspecified breast: Secondary | ICD-10-CM

## 2019-09-02 ENCOUNTER — Ambulatory Visit
Admission: RE | Admit: 2019-09-02 | Discharge: 2019-09-02 | Disposition: A | Discharge status: Home / Self Care | Payer: Medicare Other | Source: Ambulatory Visit | Attending: Internal Medicine | Admitting: Internal Medicine

## 2019-09-02 ENCOUNTER — Other Ambulatory Visit: Payer: Self-pay

## 2019-09-02 DIAGNOSIS — N63 Unspecified lump in unspecified breast: Secondary | ICD-10-CM

## 2019-09-02 DIAGNOSIS — R928 Other abnormal and inconclusive findings on diagnostic imaging of breast: Secondary | ICD-10-CM | POA: Diagnosis not present

## 2019-09-02 DIAGNOSIS — N6002 Solitary cyst of left breast: Secondary | ICD-10-CM | POA: Diagnosis not present

## 2019-09-02 LAB — HM MAMMOGRAPHY

## 2019-09-09 ENCOUNTER — Other Ambulatory Visit: Payer: Self-pay | Admitting: Sports Medicine

## 2019-09-09 ENCOUNTER — Ambulatory Visit
Admission: RE | Admit: 2019-09-09 | Discharge: 2019-09-09 | Disposition: A | Discharge status: Home / Self Care | Payer: Medicare Other | Source: Ambulatory Visit | Attending: Sports Medicine | Admitting: Sports Medicine

## 2019-09-09 DIAGNOSIS — M5442 Lumbago with sciatica, left side: Secondary | ICD-10-CM

## 2019-09-09 DIAGNOSIS — M545 Low back pain: Secondary | ICD-10-CM | POA: Diagnosis not present

## 2019-09-14 ENCOUNTER — Other Ambulatory Visit: Payer: Self-pay | Admitting: Sports Medicine

## 2019-09-14 DIAGNOSIS — M4727 Other spondylosis with radiculopathy, lumbosacral region: Secondary | ICD-10-CM

## 2019-09-14 DIAGNOSIS — M545 Low back pain, unspecified: Secondary | ICD-10-CM

## 2019-09-14 DIAGNOSIS — M329 Systemic lupus erythematosus, unspecified: Secondary | ICD-10-CM

## 2019-09-14 DIAGNOSIS — M5416 Radiculopathy, lumbar region: Secondary | ICD-10-CM

## 2019-09-15 ENCOUNTER — Other Ambulatory Visit: Payer: Self-pay | Admitting: Interventional Cardiology

## 2019-09-18 ENCOUNTER — Other Ambulatory Visit: Payer: Self-pay | Admitting: Interventional Cardiology

## 2019-09-18 ENCOUNTER — Other Ambulatory Visit: Payer: Self-pay | Admitting: Dermatology

## 2019-09-20 DIAGNOSIS — I2510 Atherosclerotic heart disease of native coronary artery without angina pectoris: Secondary | ICD-10-CM | POA: Diagnosis not present

## 2019-09-20 DIAGNOSIS — R809 Proteinuria, unspecified: Secondary | ICD-10-CM | POA: Diagnosis not present

## 2019-09-20 DIAGNOSIS — N055 Unspecified nephritic syndrome with diffuse mesangiocapillary glomerulonephritis: Secondary | ICD-10-CM | POA: Diagnosis not present

## 2019-09-20 DIAGNOSIS — E039 Hypothyroidism, unspecified: Secondary | ICD-10-CM | POA: Diagnosis not present

## 2019-09-20 DIAGNOSIS — I25810 Atherosclerosis of coronary artery bypass graft(s) without angina pectoris: Secondary | ICD-10-CM | POA: Diagnosis not present

## 2019-09-20 DIAGNOSIS — M329 Systemic lupus erythematosus, unspecified: Secondary | ICD-10-CM | POA: Diagnosis not present

## 2019-09-20 DIAGNOSIS — N189 Chronic kidney disease, unspecified: Secondary | ICD-10-CM | POA: Diagnosis not present

## 2019-09-20 DIAGNOSIS — N181 Chronic kidney disease, stage 1: Secondary | ICD-10-CM | POA: Diagnosis not present

## 2019-09-20 DIAGNOSIS — I4891 Unspecified atrial fibrillation: Secondary | ICD-10-CM | POA: Diagnosis not present

## 2019-09-20 DIAGNOSIS — D631 Anemia in chronic kidney disease: Secondary | ICD-10-CM | POA: Diagnosis not present

## 2019-09-20 LAB — COMPREHENSIVE METABOLIC PANEL
Albumin: 4 (ref 3.5–5.0)
Calcium: 9.4 (ref 8.7–10.7)

## 2019-09-20 LAB — BASIC METABOLIC PANEL
BUN: 19 (ref 4–21)
CO2: 24 — AB (ref 13–22)
Chloride: 105 (ref 99–108)
Creatinine: 1.5 — AB (ref 0.5–1.1)
Glucose: 84
Potassium: 4.9 (ref 3.4–5.3)
Sodium: 140 (ref 137–147)

## 2019-10-11 ENCOUNTER — Other Ambulatory Visit: Payer: Self-pay

## 2019-10-11 ENCOUNTER — Ambulatory Visit
Admission: RE | Admit: 2019-10-11 | Discharge: 2019-10-11 | Disposition: A | Discharge status: Home / Self Care | Payer: Medicare Other | Source: Ambulatory Visit | Attending: Sports Medicine | Admitting: Sports Medicine

## 2019-10-11 ENCOUNTER — Other Ambulatory Visit: Payer: Self-pay | Admitting: Interventional Cardiology

## 2019-10-11 ENCOUNTER — Other Ambulatory Visit: Payer: Self-pay | Admitting: Sports Medicine

## 2019-10-11 DIAGNOSIS — M5416 Radiculopathy, lumbar region: Secondary | ICD-10-CM

## 2019-10-11 DIAGNOSIS — M545 Low back pain, unspecified: Secondary | ICD-10-CM

## 2019-10-11 DIAGNOSIS — M4727 Other spondylosis with radiculopathy, lumbosacral region: Secondary | ICD-10-CM

## 2019-10-11 DIAGNOSIS — M329 Systemic lupus erythematosus, unspecified: Secondary | ICD-10-CM

## 2019-10-12 ENCOUNTER — Ambulatory Visit
Admission: RE | Admit: 2019-10-12 | Discharge: 2019-10-12 | Disposition: A | Discharge status: Home / Self Care | Payer: Medicare Other | Source: Ambulatory Visit | Attending: Sports Medicine | Admitting: Sports Medicine

## 2019-10-12 DIAGNOSIS — M545 Low back pain, unspecified: Secondary | ICD-10-CM

## 2019-10-15 ENCOUNTER — Other Ambulatory Visit: Payer: Self-pay | Admitting: Internal Medicine

## 2019-10-15 ENCOUNTER — Other Ambulatory Visit: Payer: Self-pay | Admitting: Interventional Cardiology

## 2019-10-15 DIAGNOSIS — E039 Hypothyroidism, unspecified: Secondary | ICD-10-CM

## 2019-10-19 ENCOUNTER — Telehealth: Payer: Self-pay | Admitting: Physical Medicine and Rehabilitation

## 2019-10-19 NOTE — Telephone Encounter (Signed)
Patient called. Would like a call back from [place] to schedule with Dr. Ernestina Patches

## 2019-10-26 ENCOUNTER — Other Ambulatory Visit: Payer: Self-pay | Admitting: Internal Medicine

## 2019-10-26 DIAGNOSIS — E039 Hypothyroidism, unspecified: Secondary | ICD-10-CM

## 2019-10-28 DIAGNOSIS — Z6841 Body Mass Index (BMI) 40.0 and over, adult: Secondary | ICD-10-CM | POA: Diagnosis not present

## 2019-10-28 DIAGNOSIS — M3214 Glomerular disease in systemic lupus erythematosus: Secondary | ICD-10-CM | POA: Diagnosis not present

## 2019-10-28 DIAGNOSIS — M329 Systemic lupus erythematosus, unspecified: Secondary | ICD-10-CM | POA: Diagnosis not present

## 2019-10-28 DIAGNOSIS — Z79899 Other long term (current) drug therapy: Secondary | ICD-10-CM | POA: Diagnosis not present

## 2019-10-31 ENCOUNTER — Other Ambulatory Visit: Payer: Self-pay | Admitting: Interventional Cardiology

## 2019-11-03 ENCOUNTER — Other Ambulatory Visit: Payer: Self-pay

## 2019-11-03 ENCOUNTER — Ambulatory Visit (INDEPENDENT_AMBULATORY_CARE_PROVIDER_SITE_OTHER): Payer: BC Managed Care – PPO | Admitting: Physical Medicine and Rehabilitation

## 2019-11-03 ENCOUNTER — Ambulatory Visit: Payer: Self-pay

## 2019-11-03 VITALS — BP 124/57 | HR 65

## 2019-11-03 DIAGNOSIS — G8929 Other chronic pain: Secondary | ICD-10-CM

## 2019-11-03 DIAGNOSIS — M5116 Intervertebral disc disorders with radiculopathy, lumbar region: Secondary | ICD-10-CM

## 2019-11-03 DIAGNOSIS — M5416 Radiculopathy, lumbar region: Secondary | ICD-10-CM

## 2019-11-03 DIAGNOSIS — M5442 Lumbago with sciatica, left side: Secondary | ICD-10-CM

## 2019-11-03 DIAGNOSIS — M47816 Spondylosis without myelopathy or radiculopathy, lumbar region: Secondary | ICD-10-CM

## 2019-11-03 MED ORDER — METHYLPREDNISOLONE ACETATE 80 MG/ML IJ SUSP
80.0000 mg | Freq: Once | INTRAMUSCULAR | Status: AC
Start: 2019-11-03 — End: 2019-11-03
  Administered 2019-11-03: 80 mg

## 2019-11-03 NOTE — Progress Notes (Signed)
Past injection worked well. Low back pain - across lower back. Pain in lateral legs. Numeric Pain Rating Scale and Functional Assessment Average Pain 8   In the last MONTH (on 0-10 scale) has pain interfered with the following?  1. General activity like being  able to carry out your everyday physical activities such as walking, climbing stairs, carrying groceries, or moving a chair?  Rating(8)   +Driver, -BT, -Dye Allergies.

## 2019-11-04 DIAGNOSIS — Z23 Encounter for immunization: Secondary | ICD-10-CM | POA: Diagnosis not present

## 2019-11-04 NOTE — Progress Notes (Signed)
[name] - 64 y.o. female MRN [id number]  Date of birth: 10-10-1955  Office Visit Note: Visit Date: 11/03/2019 PCP: Janith Lima, MD Referred by: Janith Lima, MD  Subjective: Chief Complaint  Patient presents with  . Lower Back - Pain   HPI:  [name] is a 64 y.o. female who comes in today at the request of Dr. Teresa Coombs, DO for planned Bilateral S1-2 Lumbar epidural steroid injection with fluoroscopic guidance.  The patient has failed conservative care including home exercise, medications, time and activity modification.  This injection will be diagnostic and hopefully therapeutic.  Please see requesting physician notes for further details and justification.  MRI reviewed with images and spine model.  MRI reviewed in the note below.  This MRI is new from the last time I saw her.  The last time I saw her was in January 2020.  Bilateral S1 transforaminal injection helped her quite a bit.  She continues to be followed by Dr. Paulla Fore for conservative care and management.  Her case is complicated by systemic lupus followed by Dr. Gavin Pound.  As of note the MRI does show significant facet arthritis at L4-5 and L5-S1 and actually some improvement of the disc herniation but still impacting the lateral recesses.  Depending on relief would have Dr. Paulla Fore consider medial branch blocks of the lower spine with goal towards may be radiofrequency ablation.   ROS Otherwise per HPI.  Assessment & Plan: Visit Diagnoses:  1. Radiculopathy due to lumbar intervertebral disc disorder   2. Lumbar radiculopathy   3. Spondylosis without myelopathy or radiculopathy, lumbar region   4. Chronic bilateral low back pain with bilateral sciatica     Plan: No additional findings.   Meds & Orders:  Meds ordered this encounter  Medications  . methylPREDNISolone acetate (DEPO-MEDROL) injection 80 mg    Orders Placed This Encounter  Procedures  . XR C-ARM NO REPORT  . Epidural  Steroid injection    Follow-up: Return for visit to requesting physician as needed.   Procedures: No procedures performed  S1 Lumbosacral Transforaminal Epidural Steroid Injection - Sub-Pedicular Approach with Fluoroscopic Guidance   Patient: [name]      Date of Birth: [birth year] MRN: [id number] PCP: Janith Lima, MD      Visit Date: 11/03/2019   Universal Protocol:    Date/Time: 08/13/216:23 AM  Consent Given By: the patient  Position:  PRONE  Additional Comments: Vital signs were monitored before and after the procedure. Patient was prepped and draped in the usual sterile fashion. The correct patient, procedure, and site was verified.   Injection Procedure Details:  Procedure Site One Meds Administered:  Meds ordered this encounter  Medications  . methylPREDNISolone acetate (DEPO-MEDROL) injection 80 mg    Laterality: Bilateral  Location/Site:  S1 Foramen   Needle size: 22 ga.  Needle type: Spinal  Needle Placement: Transforaminal  Findings:   -Comments: Excellent flow of contrast along the nerve and into the epidural space.  Epidurogram: Contrast epidurogram showed no nerve root cut off or restricted flow pattern.  Procedure Details: After squaring off the sacral end-plate to get a true AP view, the C-arm was positioned so that the best possible view of the S1 foramen was visualized. The soft tissues overlying this structure were infiltrated with 2-3 ml. of 1% Lidocaine without Epinephrine.    The spinal needle was inserted toward the target using a "trajectory" view along the fluoroscope beam.  Under  AP and lateral visualization, the needle was advanced so it did not puncture dura. Biplanar projections were used to confirm position. Aspiration was confirmed to be negative for CSF and/or blood. A 1-2 ml. volume of Isovue-250 was injected and flow of contrast was noted at each level. Radiographs were obtained for documentation purposes.    After attaining the desired flow of contrast documented above, a 0.5 to 1.0 ml test dose of 0.25% Marcaine was injected into each respective transforaminal space.  The patient was observed for 90 seconds post injection.  After no sensory deficits were reported, and normal lower extremity motor function was noted,   the above injectate was administered so that equal amounts of the injectate were placed at each foramen (level) into the transforaminal epidural space.   Additional Comments:  The patient tolerated the procedure well Dressing: Band-Aid with 2 x 2 sterile gauze    Post-procedure details: Patient was observed during the procedure. Post-procedure instructions were reviewed.  Patient left the clinic in stable condition.     Clinical History: Low back pain with right leg pain  EXAM: MRI LUMBAR SPINE WITHOUT CONTRAST  TECHNIQUE: Multiplanar, multisequence MR imaging of the lumbar spine was performed. No intravenous contrast was administered.  COMPARISON:  Lumbar MRI 08/31/2016  FINDINGS: Segmentation:  Normal  Alignment:  Mild retrolisthesis L1-2, L2-3, L3-4.  Vertebrae: Negative for fracture or mass. Discogenic changes on the left at L5-S1 have progressed in the interval.  Conus medullaris and cauda equina: Conus extends to the L1-2 level. Conus and cauda equina appear normal.  Paraspinal and other soft tissues: Negative for paraspinous mass, adenopathy, or fluid collection.  Disc levels:  L1-2: Mild degenerative change.  Negative for stenosis  L2-3: Mild facet degeneration.  Negative for stenosis  L3-4: Mild to moderate facet degeneration. Mild disc bulging. No significant stenosis.  L4-5: Severe facet degeneration bilaterally with facet joint overgrowth and bilateral facet joint effusions. This is similar to the prior study. There is diffuse bulging of the disc. Mild spinal stenosis and moderate subarticular stenosis similar to the  prior study.  L5-S1: Progressive disc degeneration and disc space narrowing. Sclerotic endplate changes and spurring on the left have progressed since the prior study. There is bilateral facet hypertrophy. There is moderate subarticular stenosis on the left which has progressed in the interval. Left paracentral disc protrusion shows significant interval improvement. There is improved mass effect on left S1 nerve root compared with the prior study.  IMPRESSION: Mild spinal stenosis and moderate subarticular stenosis bilaterally L4-5 unchanged. Severe facet degeneration L4-5  Improvement in left-sided disc protrusion L5-S1 with less mass effect on the S1 nerve root. There is progressive disc degeneration and spurring on the left at L5-S1 with progression of subarticular stenosis on the left due to spurring.   Electronically Signed   By: Franchot Gallo M.D.   On: 10/13/2019 15:05     Objective:  VS:  HT:    WT:   BMI:     BP:(!) 124/57  HR:65bpm  TEMP: ( )  RESP:  Physical Exam Constitutional:      General: She is not in acute distress.    Appearance: Normal appearance. She is not ill-appearing.  HENT:     Head: Normocephalic and atraumatic.     Right Ear: External ear normal.     Left Ear: External ear normal.  Eyes:     Extraocular Movements: Extraocular movements intact.  Cardiovascular:     Rate and Rhythm: Normal rate.  Pulses: Normal pulses.  Musculoskeletal:     Right lower leg: No edema.     Left lower leg: No edema.     Comments: Patient has good distal strength with no pain over the greater trochanters.  No clonus or focal weakness. Patient somewhat slow to rise from a seated position to full extension.  There is concordant low back pain with facet loading and lumbar spine extension rotation.  There are no definitive trigger points but the patient is somewhat tender across the lower back and PSIS.  There is no pain with hip rotation.  Skin:     Findings: No erythema, lesion or rash.  Neurological:     General: No focal deficit present.     Mental Status: She is alert and oriented to person, place, and time.     Sensory: No sensory deficit.     Motor: No weakness or abnormal muscle tone.     Coordination: Coordination normal.  Psychiatric:        Mood and Affect: Mood normal.        Behavior: Behavior normal.      Imaging: XR C-ARM NO REPORT  Result Date: 11/03/2019 Please see Notes tab for imaging impression.

## 2019-11-04 NOTE — Procedures (Signed)
S1 Lumbosacral Transforaminal Epidural Steroid Injection - Sub-Pedicular Approach with Fluoroscopic Guidance   Patient: [name]      Date of Birth: October 01, 1955 MRN: [id number] PCP: Janith Lima, MD      Visit Date: 11/03/2019   Universal Protocol:    Date/Time: 08/13/216:23 AM  Consent Given By: the patient  Position:  PRONE  Additional Comments: Vital signs were monitored before and after the procedure. Patient was prepped and draped in the usual sterile fashion. The correct patient, procedure, and site was verified.   Injection Procedure Details:  Procedure Site One Meds Administered:  Meds ordered this encounter  Medications  . methylPREDNISolone acetate (DEPO-MEDROL) injection 80 mg    Laterality: Bilateral  Location/Site:  S1 Foramen   Needle size: 22 ga.  Needle type: Spinal  Needle Placement: Transforaminal  Findings:   -Comments: Excellent flow of contrast along the nerve and into the epidural space.  Epidurogram: Contrast epidurogram showed no nerve root cut off or restricted flow pattern.  Procedure Details: After squaring off the sacral end-plate to get a true AP view, the C-arm was positioned so that the best possible view of the S1 foramen was visualized. The soft tissues overlying this structure were infiltrated with 2-3 ml. of 1% Lidocaine without Epinephrine.    The spinal needle was inserted toward the target using a "trajectory" view along the fluoroscope beam.  Under AP and lateral visualization, the needle was advanced so it did not puncture dura. Biplanar projections were used to confirm position. Aspiration was confirmed to be negative for CSF and/or blood. A 1-2 ml. volume of Isovue-250 was injected and flow of contrast was noted at each level. Radiographs were obtained for documentation purposes.   After attaining the desired flow of contrast documented above, a 0.5 to 1.0 ml test dose of 0.25% Marcaine was injected into each  respective transforaminal space.  The patient was observed for 90 seconds post injection.  After no sensory deficits were reported, and normal lower extremity motor function was noted,   the above injectate was administered so that equal amounts of the injectate were placed at each foramen (level) into the transforaminal epidural space.   Additional Comments:  The patient tolerated the procedure well Dressing: Band-Aid with 2 x 2 sterile gauze    Post-procedure details: Patient was observed during the procedure. Post-procedure instructions were reviewed.  Patient left the clinic in stable condition.

## 2019-11-09 NOTE — Progress Notes (Signed)
Cardiology Office Note:    Date:  11/10/2019   ID:  Rene Paci, DOB 03/29/1955, MRN [id number]  PCP:  Janith Lima, MD  Cardiologist:  Sinclair Grooms, MD   Referring MD: Janith Lima, MD   Chief Complaint  Patient presents with  . Coronary Artery Disease    History of Present Illness:    [name] is a 64 y.o. female with a hx of lupus erythematosus, coronary artery disease with prior history of stenting and ultimate recent four-vessel coronary bypass grafting 2018, mitral valve annuloplasty at the time of CABG in 2018, postoperative atrial fibrillation, PAD, and ascending aortic aneurysm.  She is doing well.  She is gaining weight.  She is not having angina.  She denies palpitations consistent with atrial fib.  She has concerns about her thyroid.  She has had no recent blood work.  She has not seen her primary physician.  She requests a handicap sticker be authorized.  Exercising 60 minutes twice weekly.  We discussed the importance of achieving 150 or greater minutes per week of moderate activity.  Past Medical History:  Diagnosis Date  . CAD (coronary artery disease)    a. remote stents/angioplasty. b. s/p CABG 01/2017 with mitral valve annuloplasty.  . Esophageal reflux   . GERD (gastroesophageal reflux disease)   . Hypothyroidism   . Immune thrombocytopenic purpura ([place])   . Lupus erythematosus   . Mitral valve insufficiency and aortic valve insufficiency   . Myocardial infarction ([place])    x 2  . PONV (postoperative nausea and vomiting)   . Postoperative atrial fibrillation ([place])   . Severe mitral regurgitation    a. s/p MV annuloplasty 01/2017 at time of CABG.  . Status post mitral valve annuloplasty   . Unspecified disease of pericardium     Past Surgical History:  Procedure Laterality Date  . CHOLECYSTECTOMY, LAPAROSCOPIC  2010  . CORONARY ARTERY BYPASS GRAFT N/A 01/30/2017   Procedure: CORONARY ARTERY BYPASS GRAFTING (CABG), Times four   using the right saphaneous vein, harvested endoscopicly.  and left internal mammary artery .;  Surgeon: Gaye Pollack, MD;  Location: [facility];  Service: Open Heart Surgery;  Laterality: N/A;  . LEFT HEART CATH AND CORONARY ANGIOGRAPHY N/A 01/23/2017   Procedure: LEFT HEART CATH AND CORONARY ANGIOGRAPHY;  Surgeon: Belva Crome, MD;  Location: [facility];  Service: Cardiovascular;  Laterality: N/A;  . MITRAL VALVE REPAIR N/A 01/30/2017   Procedure: MITRAL VALVE REPAIR (MVR);  Surgeon: Gaye Pollack, MD;  Location: [facility];  Service: Open Heart Surgery;  Laterality: N/A;  . plastic surgical repair      dog bite to leg  . SPLENECTOMY  5-04  . TEE WITHOUT CARDIOVERSION N/A 01/27/2017   Procedure: TRANSESOPHAGEAL ECHOCARDIOGRAM (TEE);  Surgeon: Sanda Klein, MD;  Location: [facility] ENDOSCOPY;  Service: Cardiovascular;  Laterality: N/A;  . TEE WITHOUT CARDIOVERSION N/A 01/30/2017   Procedure: TRANSESOPHAGEAL ECHOCARDIOGRAM (TEE);  Surgeon: Gaye Pollack, MD;  Location: [facility];  Service: Open Heart Surgery;  Laterality: N/A;  . ULTRASOUND GUIDANCE FOR VASCULAR ACCESS  01/23/2017   Procedure: Ultrasound Guidance For Vascular Access;  Surgeon: Belva Crome, MD;  Location: [facility];  Service: Cardiovascular;;    Current Medications: Current Meds  Medication Sig  . amLODipine (NORVASC) 5 MG tablet TAKE 1 TABLET BY MOUTH EVERY DAY  . azaTHIOprine (IMURAN) 50 MG tablet Take 50 mg by mouth 2 (two) times daily.  Marland Kitchen  hydroxychloroquine (PLAQUENIL) 200 MG tablet Take 200 mg by mouth daily.   Marland Kitchen levothyroxine (SYNTHROID) 75 MCG tablet TAKE 1 TABLET BY MOUTH EVERY DAY  . metoprolol succinate (TOPROL-XL) 50 MG 24 hr tablet Take 1 tablet (50 mg total) by mouth daily. With or immediately following a meal. Please keep upcoming appt with Dr. Tamala Julian in August. Thank you  . pantoprazole (PROTONIX) 40 MG tablet TAKE 1 TABLET BY MOUTH EVERY DAY  . rosuvastatin (CRESTOR) 20 MG tablet Take 1 tablet (20 mg  total) by mouth daily. Please keep upcoming appt in August with Dr. Tamala Julian before anymore refills. Thank you     Allergies:   Patient has no known allergies.   Social History   Socioeconomic History  . Marital status: Single    Spouse name: Not on file  . Number of children: Not on file  . Years of education: Not on file  . Highest education level: Not on file  Occupational History  . Not on file  Tobacco Use  . Smoking status: Former Smoker    Packs/day: 0.50    Years: 30.00    Pack years: 15.00    Types: Cigarettes    Quit date: 12/23/2011    Years since quitting: 7.8  . Smokeless tobacco: Never Used  Vaping Use  . Vaping Use: Former  Substance and Sexual Activity  . Alcohol use: No  . Drug use: No  . Sexual activity: Not Currently    Comment: 1st intercourse 64 yo-More than 5 partners  Other Topics Concern  . Not on file  Social History Narrative   HSG,  graduated from [employer] in [place]. In college [name] -grad '13 with relgious studies. [employer] - Fall '13 for MA-divinity. Marrried '73 - 2 years/ divorced. 2 son- '73, '75 - CP.    Occupation: full-time Ship broker. She lives alone with her younger son living with her part-time but he resides in a managed care facility.    Social Determinants of Health   Financial Resource Strain:   . Difficulty of Paying Living Expenses: Not on file  Food Insecurity:   . Worried About Charity fundraiser in the Last Year: Not on file  . Ran Out of Food in the Last Year: Not on file  Transportation Needs:   . Lack of Transportation (Medical): Not on file  . Lack of Transportation (Non-Medical): Not on file  Physical Activity:   . Days of Exercise per Week: Not on file  . Minutes of Exercise per Session: Not on file  Stress:   . Feeling of Stress : Not on file  Social Connections:   . Frequency of Communication with Friends and Family: Not on file  . Frequency of Social Gatherings with Friends and Family: Not on  file  . Attends Religious Services: Not on file  . Active Member of Clubs or Organizations: Not on file  . Attends Archivist Meetings: Not on file  . Marital Status: Not on file     Family History: The patient's family history includes Breast cancer in her mother; Cancer in her mother and paternal grandfather; Fibromyalgia in her mother; GER disease in her father; Heart attack in her maternal grandmother; Heart disease in her father; Hypertension in her father; Paget's disease of bone in her mother.  ROS:   Please see the history of present illness.    Gaining weight.  Stop Synthroid autonomously.  Laboratory work from Dr. Justin Mend indicate normal LV function  with creatinine of 1.16.  Blood work is outdated.  All other systems reviewed and are negative.  EKGs/Labs/Other Studies Reviewed:    The following studies were reviewed today: No new or recent cardiac data.  EKG:  EKG normal sinus rhythm, small inferior Q waves, otherwise normal.  When compared to prior from February 02, 2018, no changes noted.  Recent Labs: No results found for requested labs within last 8760 hours.  Recent Lipid Panel    Component Value Date/Time   CHOL 134 02/03/2018 1343   CHOL 141 12/02/2017 1124   TRIG 52.0 02/03/2018 1343   HDL 55.90 02/03/2018 1343   HDL 53 12/02/2017 1124   CHOLHDL 2 02/03/2018 1343   VLDL 10.4 02/03/2018 1343   LDLCALC 68 02/03/2018 1343   LDLCALC 76 12/02/2017 1124   LDLDIRECT 321.8 02/27/2012 1202    Physical Exam:    VS:  BP 104/68   Pulse 77   Ht 5\' 5"  (1.651 m)   Wt 264 lb (119.7 kg)   SpO2 97%   BMI 43.93 kg/m     Wt Readings from Last 3 Encounters:  11/10/19 264 lb (119.7 kg)  10/25/18 247 lb (112 kg)  09/02/18 248 lb (112.5 kg)     GEN: Obese.. No acute distress HEENT: Normal NECK: No JVD. LYMPHATICS: No lymphadenopathy CARDIAC:  RRR without murmur, gallop, or edema. VASCULAR:  Normal Pulses. No bruits. RESPIRATORY:  Clear to auscultation  without rales, wheezing or rhonchi  ABDOMEN: Soft, non-tender, non-distended, No pulsatile mass, MUSCULOSKELETAL: No deformity  SKIN: Warm and dry NEUROLOGIC:  Alert and oriented x 3 PSYCHIATRIC:  Normal affect   ASSESSMENT:    1. S/P MVR (mitral valve repair)   2. Coronary artery disease involving coronary bypass graft of native heart with angina pectoris ([place])   3. Thoracic aortic aneurysm without rupture ([place])   4. Essential hypertension   5. Mixed hyperlipidemia   6. Atrial fibrillation, unspecified type ([place])   7. Systemic lupus erythematosus with focal and segmental proliferative glomerulonephritis ([place])   8. Educated about COVID-19 virus infection    PLAN:    In order of problems listed above:  1. No auscultatory evidence of mitral valve dysfunction. 2. Secondary prevention discussed in great detail.  She needs to increase moderate activity to achieve greater than 150 minutes.  She needs to have her lipid panel updated.  She also needs to have TSH checked.  This will be done by her primary physician who I have recommended that she see as soon as possible.  This is especially important since she has stopped taking Synthroid. 3. No prior imaging data suggest the presence of an aneurysm.  I will investigate the chart and if nothing is found, this should be removed from the problem list. 4. Excellent blood pressure control on amlodipine and metoprolol.  Continue therapy. 5. Outdated lipid panel needs to be updated.  Continue Crestor 20 mg/day.  Liver and lipid panel when she sees primary care. 6. No recurrence of atrial fibrillation. 7. Needs to follow-up with primary care.  She continues to see Dr. Edrick Oh. 8. She has been vaccinated for COVID-19 and appears to have already received a third dose.  Overall education and awareness concerning primary/secondary risk prevention was discussed in detail: LDL less than 70, hemoglobin A1c less than 7, blood pressure target less than  130/80 mmHg, >150 minutes of moderate aerobic activity per week, avoidance of smoking, weight control (via diet and exercise), and continued surveillance/management of/for  obstructive sleep apnea.    Medication Adjustments/Labs and Tests Ordered: Current medicines are reviewed at length with the patient today.  Concerns regarding medicines are outlined above.  Orders Placed This Encounter  Procedures  . EKG 12-Lead   No orders of the defined types were placed in this encounter.   Patient Instructions  Medication Instructions:  Your physician recommends that you continue on your current medications as directed. Please refer to the Current Medication list given to you today.  *If you need a refill on your cardiac medications before your next appointment, please call your pharmacy*   Lab Work: None If you have labs (blood work) drawn today and your tests are completely normal, you will receive your results only by: Marland Kitchen MyChart Message (if you have MyChart) OR . A paper copy in the mail If you have any lab test that is abnormal or we need to change your treatment, we will call you to review the results.   Testing/Procedures: None   Follow-Up: At [facility], you and your health needs are our priority.  As part of our continuing mission to provide you with exceptional heart care, we have created designated Provider Care Teams.  These Care Teams include your primary Cardiologist (physician) and Advanced Practice Providers (APPs -  Physician Assistants and Nurse Practitioners) who all work together to provide you with the care you need, when you need it.  We recommend signing up for the patient portal called "MyChart".  Sign up information is provided on this After Visit Summary.  MyChart is used to connect with patients for Virtual Visits (Telemedicine).  Patients are able to view lab/test results, encounter notes, upcoming appointments, etc.  Non-urgent messages can be sent to your  provider as well.   To learn more about what you can do with MyChart, go to [email or web address].    Your next appointment:   12 month(s)  The format for your next appointment:   In Person  Provider:   You may see Sinclair Grooms, MD or one of the following Advanced Practice Providers on your designated Care Team:    Truitt Merle, NP  Cecilie Kicks, NP  Kathyrn Drown, NP    Other Instructions      Signed, Sinclair Grooms, MD  11/10/2019 9:24 AM    [place]

## 2019-11-10 ENCOUNTER — Encounter: Payer: Self-pay | Admitting: Interventional Cardiology

## 2019-11-10 ENCOUNTER — Other Ambulatory Visit: Payer: Self-pay

## 2019-11-10 ENCOUNTER — Ambulatory Visit (INDEPENDENT_AMBULATORY_CARE_PROVIDER_SITE_OTHER): Payer: BC Managed Care – PPO | Admitting: Interventional Cardiology

## 2019-11-10 VITALS — BP 104/68 | HR 77 | Ht 65.0 in | Wt 264.0 lb

## 2019-11-10 DIAGNOSIS — I10 Essential (primary) hypertension: Secondary | ICD-10-CM

## 2019-11-10 DIAGNOSIS — I25709 Atherosclerosis of coronary artery bypass graft(s), unspecified, with unspecified angina pectoris: Secondary | ICD-10-CM

## 2019-11-10 DIAGNOSIS — I712 Thoracic aortic aneurysm, without rupture, unspecified: Secondary | ICD-10-CM

## 2019-11-10 DIAGNOSIS — E782 Mixed hyperlipidemia: Secondary | ICD-10-CM

## 2019-11-10 DIAGNOSIS — Z9889 Other specified postprocedural states: Secondary | ICD-10-CM | POA: Diagnosis not present

## 2019-11-10 DIAGNOSIS — I4891 Unspecified atrial fibrillation: Secondary | ICD-10-CM | POA: Diagnosis not present

## 2019-11-10 DIAGNOSIS — Z7189 Other specified counseling: Secondary | ICD-10-CM

## 2019-11-10 DIAGNOSIS — M3214 Glomerular disease in systemic lupus erythematosus: Secondary | ICD-10-CM | POA: Diagnosis not present

## 2019-11-10 NOTE — Patient Instructions (Signed)

## 2019-11-14 ENCOUNTER — Other Ambulatory Visit: Payer: Self-pay

## 2019-11-14 ENCOUNTER — Encounter: Payer: Self-pay | Admitting: Internal Medicine

## 2019-11-14 ENCOUNTER — Ambulatory Visit (INDEPENDENT_AMBULATORY_CARE_PROVIDER_SITE_OTHER): Payer: BC Managed Care – PPO | Admitting: Internal Medicine

## 2019-11-14 VITALS — BP 120/82 | HR 7 | Temp 98.5°F | Ht 65.0 in | Wt 261.0 lb

## 2019-11-14 DIAGNOSIS — E785 Hyperlipidemia, unspecified: Secondary | ICD-10-CM

## 2019-11-14 DIAGNOSIS — E039 Hypothyroidism, unspecified: Secondary | ICD-10-CM | POA: Diagnosis not present

## 2019-11-14 DIAGNOSIS — K219 Gastro-esophageal reflux disease without esophagitis: Secondary | ICD-10-CM

## 2019-11-14 DIAGNOSIS — Z0000 Encounter for general adult medical examination without abnormal findings: Secondary | ICD-10-CM

## 2019-11-14 DIAGNOSIS — I25709 Atherosclerosis of coronary artery bypass graft(s), unspecified, with unspecified angina pectoris: Secondary | ICD-10-CM

## 2019-11-14 NOTE — Patient Instructions (Signed)
Health Maintenance, Female Adopting a healthy lifestyle and getting preventive care are important in promoting health and wellness. Ask your health care provider about:  The right schedule for you to have regular tests and exams.  Things you can do on your own to prevent diseases and keep yourself healthy. What should I know about diet, weight, and exercise? Eat a healthy diet   Eat a diet that includes plenty of vegetables, fruits, low-fat dairy products, and lean protein.  Do not eat a lot of foods that are high in solid fats, added sugars, or sodium. Maintain a healthy weight Body mass index (BMI) is used to identify weight problems. It estimates body fat based on height and weight. Your health care provider can help determine your BMI and help you achieve or maintain a healthy weight. Get regular exercise Get regular exercise. This is one of the most important things you can do for your health. Most adults should:  Exercise for at least 150 minutes each week. The exercise should increase your heart rate and make you sweat (moderate-intensity exercise).  Do strengthening exercises at least twice a week. This is in addition to the moderate-intensity exercise.  Spend less time sitting. Even light physical activity can be beneficial. Watch cholesterol and blood lipids Have your blood tested for lipids and cholesterol at 64 years of age, then have this test every 5 years. Have your cholesterol levels checked more often if:  Your lipid or cholesterol levels are high.  You are older than 64 years of age.  You are at high risk for heart disease. What should I know about cancer screening? Depending on your health history and family history, you may need to have cancer screening at various ages. This may include screening for:  Breast cancer.  Cervical cancer.  Colorectal cancer.  Skin cancer.  Lung cancer. What should I know about heart disease, diabetes, and high blood  pressure? Blood pressure and heart disease  High blood pressure causes heart disease and increases the risk of stroke. This is more likely to develop in people who have high blood pressure readings, are of African descent, or are overweight.  Have your blood pressure checked: ? Every 3-5 years if you are 18-39 years of age. ? Every year if you are 40 years old or older. Diabetes Have regular diabetes screenings. This checks your fasting blood sugar level. Have the screening done:  Once every three years after age 40 if you are at a normal weight and have a low risk for diabetes.  More often and at a younger age if you are overweight or have a high risk for diabetes. What should I know about preventing infection? Hepatitis B If you have a higher risk for hepatitis B, you should be screened for this virus. Talk with your health care provider to find out if you are at risk for hepatitis B infection. Hepatitis C Testing is recommended for:  Everyone born from 1945 through 1965.  Anyone with known risk factors for hepatitis C. Sexually transmitted infections (STIs)  Get screened for STIs, including gonorrhea and chlamydia, if: ? You are sexually active and are younger than 64 years of age. ? You are older than 64 years of age and your health care provider tells you that you are at risk for this type of infection. ? Your sexual activity has changed since you were last screened, and you are at increased risk for chlamydia or gonorrhea. Ask your health care provider if   you are at risk.  Ask your health care provider about whether you are at high risk for HIV. Your health care provider may recommend a prescription medicine to help prevent HIV infection. If you choose to take medicine to prevent HIV, you should first get tested for HIV. You should then be tested every 3 months for as long as you are taking the medicine. Pregnancy  If you are about to stop having your period (premenopausal) and  you may become pregnant, seek counseling before you get pregnant.  Take 400 to 800 micrograms (mcg) of folic acid every day if you become pregnant.  Ask for birth control (contraception) if you want to prevent pregnancy. Osteoporosis and menopause Osteoporosis is a disease in which the bones lose minerals and strength with aging. This can result in bone fractures. If you are 65 years old or older, or if you are at risk for osteoporosis and fractures, ask your health care provider if you should:  Be screened for bone loss.  Take a calcium or vitamin D supplement to lower your risk of fractures.  Be given hormone replacement therapy (HRT) to treat symptoms of menopause. Follow these instructions at home: Lifestyle  Do not use any products that contain nicotine or tobacco, such as cigarettes, e-cigarettes, and chewing tobacco. If you need help quitting, ask your health care provider.  Do not use street drugs.  Do not share needles.  Ask your health care provider for help if you need support or information about quitting drugs. Alcohol use  Do not drink alcohol if: ? Your health care provider tells you not to drink. ? You are pregnant, may be pregnant, or are planning to become pregnant.  If you drink alcohol: ? Limit how much you use to 0-1 drink a day. ? Limit intake if you are breastfeeding.  Be aware of how much alcohol is in your drink. In the [place] one drink equals one 12 oz bottle of beer (355 mL), one 5 oz glass of wine (148 mL), or one 1 oz glass of hard liquor (44 mL). General instructions  Schedule regular health, dental, and eye exams.  Stay current with your vaccines.  Tell your health care provider if: ? You often feel depressed. ? You have ever been abused or do not feel safe at home. Summary  Adopting a healthy lifestyle and getting preventive care are important in promoting health and wellness.  Follow your health care provider's instructions about healthy  diet, exercising, and getting tested or screened for diseases.  Follow your health care provider's instructions on monitoring your cholesterol and blood pressure. This information is not intended to replace advice given to you by your health care provider. Make sure you discuss any questions you have with your health care provider. Document Revised: 03/03/2018 Document Reviewed: 03/03/2018 Elsevier Patient Education  2020 [employer]  

## 2019-11-14 NOTE — Progress Notes (Signed)
Subjective:  Patient ID: [name], female    DOB: October 16, 1955  Age: 64 y.o. MRN: [id number]  CC: Annual Exam, Coronary Artery Disease, Hyperlipidemia, and Hypothyroidism  This visit occurred during the SARS-CoV-2 public health emergency.  Safety protocols were in place, including screening questions prior to the visit, additional usage of staff PPE, and extensive cleaning of exam room while observing appropriate contact time as indicated for disinfecting solutions.    HPI Harjot Zavadil presents for a CPX.  She tells me she ran out of levothyroxine about 3 weeks ago.  About 6 weeks ago she saw her nephrologist and her TSH was normal while she was taking the current dose of levothyroxine.  Over the last few weeks she has developed constipation and weight gain.  She is active and denies any recent episodes of chest pain, shortness of breath, palpitations, or edema.  Outpatient Medications Prior to Visit  Medication Sig Dispense Refill  . azaTHIOprine (IMURAN) 50 MG tablet Take 50 mg by mouth 2 (two) times daily.    . hydroxychloroquine (PLAQUENIL) 200 MG tablet Take 200 mg by mouth daily.     . metoprolol succinate (TOPROL-XL) 50 MG 24 hr tablet Take 1 tablet (50 mg total) by mouth daily. With or immediately following a meal. Please keep upcoming appt with Dr. Tamala Julian in August. Thank you 30 tablet 0  . pantoprazole (PROTONIX) 40 MG tablet TAKE 1 TABLET BY MOUTH EVERY DAY 90 tablet 3  . rosuvastatin (CRESTOR) 20 MG tablet Take 1 tablet (20 mg total) by mouth daily. Please keep upcoming appt in August with Dr. Tamala Julian before anymore refills. Thank you 90 tablet 0  . amLODipine (NORVASC) 5 MG tablet TAKE 1 TABLET BY MOUTH EVERY DAY 90 tablet 0  . levothyroxine (SYNTHROID) 75 MCG tablet TAKE 1 TABLET BY MOUTH EVERY DAY 90 tablet 1   No facility-administered medications prior to visit.    ROS Review of Systems  Constitutional: Positive for unexpected weight change. Negative for  chills, diaphoresis and fatigue.  HENT: Negative.   Eyes: Negative.   Respiratory: Negative for cough, chest tightness, shortness of breath and wheezing.   Cardiovascular: Negative for chest pain, palpitations and leg swelling.  Gastrointestinal: Positive for constipation. Negative for abdominal pain, blood in stool, diarrhea, nausea and vomiting.  Endocrine: Negative for cold intolerance and heat intolerance.  Genitourinary: Negative.  Negative for difficulty urinating.  Musculoskeletal: Negative for arthralgias and myalgias.  Skin: Negative.  Negative for color change and pallor.    Objective:  BP 120/82 (BP Location: Left Arm, Patient Position: Sitting, Cuff Size: Large)   Pulse (!) 7   Temp 98.5 F (36.9 C) (Oral)   Ht 5\' 5"  (1.651 m)   Wt 261 lb (118.4 kg)   SpO2 96%   BMI 43.43 kg/m   BP Readings from Last 3 Encounters:  11/14/19 120/82  11/10/19 104/68  11/03/19 (!) 124/57    Wt Readings from Last 3 Encounters:  11/14/19 261 lb (118.4 kg)  11/10/19 264 lb (119.7 kg)  10/25/18 247 lb (112 kg)    Physical Exam Vitals reviewed.  Constitutional:      Appearance: She is obese. She is not ill-appearing.  HENT:     Nose: Nose normal.     Mouth/Throat:     Mouth: Mucous membranes are moist.  Eyes:     General: No scleral icterus.    Conjunctiva/sclera: Conjunctivae normal.  Cardiovascular:     Rate and Rhythm: Normal rate  and regular rhythm.     Heart sounds: No murmur heard.   Pulmonary:     Effort: Pulmonary effort is normal.     Breath sounds: No stridor. No wheezing, rhonchi or rales.  Abdominal:     General: Abdomen is protuberant. There is no distension.     Palpations: Abdomen is soft. There is no hepatomegaly, splenomegaly or mass.     Tenderness: There is no abdominal tenderness.  Musculoskeletal:        General: Normal range of motion.     Cervical back: Neck supple.     Right lower leg: No edema.     Left lower leg: No edema.  Lymphadenopathy:       Cervical: No cervical adenopathy.  Skin:    General: Skin is warm and dry.     Coloration: Skin is not pale.  Neurological:     General: No focal deficit present.     Mental Status: She is alert. Mental status is at baseline.  Psychiatric:        Mood and Affect: Mood normal.        Behavior: Behavior normal.     Lab Results  Component Value Date   WBC 3.7 (L) 11/14/2019   HGB 13.1 11/14/2019   HCT 38.9 11/14/2019   PLT 215 11/14/2019   GLUCOSE 88 08/04/2018   CHOL 143 11/14/2019   TRIG 55 11/14/2019   HDL 61 11/14/2019   LDLDIRECT 321.8 02/27/2012   LDLCALC 68 11/14/2019   ALT 13 11/14/2019   AST 26 11/14/2019   NA 140 09/20/2019   K 4.9 09/20/2019   CL 105 09/20/2019   CREATININE 1.5 (A) 09/20/2019   BUN 19 09/20/2019   CO2 24 (A) 09/20/2019   TSH 4.33 11/14/2019   INR 1.09 07/01/2017   HGBA1C 5.6 01/31/2017    MR LUMBAR SPINE WO CONTRAST  Result Date: 10/13/2019 CLINICAL DATA:  Low back pain with right leg pain EXAM: MRI LUMBAR SPINE WITHOUT CONTRAST TECHNIQUE: Multiplanar, multisequence MR imaging of the lumbar spine was performed. No intravenous contrast was administered. COMPARISON:  Lumbar MRI 08/31/2016 FINDINGS: Segmentation:  Normal Alignment:  Mild retrolisthesis L1-2, L2-3, L3-4. Vertebrae: Negative for fracture or mass. Discogenic changes on the left at L5-S1 have progressed in the interval. Conus medullaris and cauda equina: Conus extends to the L1-2 level. Conus and cauda equina appear normal. Paraspinal and other soft tissues: Negative for paraspinous mass, adenopathy, or fluid collection. Disc levels: L1-2: Mild degenerative change.  Negative for stenosis L2-3: Mild facet degeneration.  Negative for stenosis L3-4: Mild to moderate facet degeneration. Mild disc bulging. No significant stenosis. L4-5: Severe facet degeneration bilaterally with facet joint overgrowth and bilateral facet joint effusions. This is similar to the prior study. There is diffuse  bulging of the disc. Mild spinal stenosis and moderate subarticular stenosis similar to the prior study. L5-S1: Progressive disc degeneration and disc space narrowing. Sclerotic endplate changes and spurring on the left have progressed since the prior study. There is bilateral facet hypertrophy. There is moderate subarticular stenosis on the left which has progressed in the interval. Left paracentral disc protrusion shows significant interval improvement. There is improved mass effect on left S1 nerve root compared with the prior study. IMPRESSION: Mild spinal stenosis and moderate subarticular stenosis bilaterally L4-5 unchanged. Severe facet degeneration L4-5 Improvement in left-sided disc protrusion L5-S1 with less mass effect on the S1 nerve root. There is progressive disc degeneration and spurring on the left at L5-S1  with progression of subarticular stenosis on the left due to spurring. Electronically Signed   By: Franchot Gallo M.D.   On: 10/13/2019 15:05    Assessment & Plan:   Malita was seen today for annual exam, coronary artery disease, hyperlipidemia and hypothyroidism.  Diagnoses and all orders for this visit:  Acquired hypothyroidism- Her TSH has drifted up and she is symptomatic.  This is due to noncompliance.  I recommended that she restart levothyroxine at 75 mcg a day. -     TSH; Future -     TSH -     levothyroxine (SYNTHROID) 75 MCG tablet; Take 1 tablet (75 mcg total) by mouth daily.  Hyperlipidemia with target LDL less than 70- She has achieved her LDL goal and is doing well on the statin. -     Hepatic function panel; Future -     Hepatic function panel  Routine general medical examination at a health care facility- Exam completed, labs reviewed, vaccines are up-to-date, cancer screenings are up-to-date, patient education material was given. -     Lipid panel; Future -     Lipid panel  Gastroesophageal reflux disease without esophagitis- Her symptoms are well controlled  with the PPI.  No complications noted. -     CBC with Differential/Platelet; Future -     Cancel: BASIC METABOLIC PANEL WITH GFR; Future -     CBC with Differential/Platelet   I have changed Levada Dy F. Rappleye's levothyroxine. I am also having her maintain her azaTHIOprine, hydroxychloroquine, rosuvastatin, metoprolol succinate, and pantoprazole.  Meds ordered this encounter  Medications  . levothyroxine (SYNTHROID) 75 MCG tablet    Sig: Take 1 tablet (75 mcg total) by mouth daily.    Dispense:  90 tablet    Refill:  1     Follow-up: Return in about 6 months (around 05/16/2020).  Scarlette Calico, MD

## 2019-11-15 ENCOUNTER — Other Ambulatory Visit: Payer: Self-pay | Admitting: Interventional Cardiology

## 2019-11-15 LAB — CBC WITH DIFFERENTIAL/PLATELET
Absolute Monocytes: 773 cells/uL (ref 200–950)
Basophils Absolute: 41 cells/uL (ref 0–200)
Basophils Relative: 1.1 %
Eosinophils Absolute: 263 cells/uL (ref 15–500)
Eosinophils Relative: 7.1 %
HCT: 38.9 % (ref 35.0–45.0)
Hemoglobin: 13.1 g/dL (ref 11.7–15.5)
Lymphs Abs: 644 cells/uL — ABNORMAL LOW (ref 850–3900)
MCH: 33.2 pg — ABNORMAL HIGH (ref 27.0–33.0)
MCHC: 33.7 g/dL (ref 32.0–36.0)
MCV: 98.5 fL (ref 80.0–100.0)
MPV: 13.2 fL — ABNORMAL HIGH (ref 7.5–12.5)
Monocytes Relative: 20.9 %
Neutro Abs: 1980 cells/uL (ref 1500–7800)
Neutrophils Relative %: 53.5 %
Platelets: 215 10*3/uL (ref 140–400)
RBC: 3.95 10*6/uL (ref 3.80–5.10)
RDW: 13.1 % (ref 11.0–15.0)
Total Lymphocyte: 17.4 %
WBC: 3.7 10*3/uL — ABNORMAL LOW (ref 3.8–10.8)

## 2019-11-15 LAB — HEPATIC FUNCTION PANEL
AG Ratio: 1.2 (calc) (ref 1.0–2.5)
ALT: 13 U/L (ref 6–29)
AST: 26 U/L (ref 10–35)
Albumin: 3.6 g/dL (ref 3.6–5.1)
Alkaline phosphatase (APISO): 85 U/L (ref 37–153)
Bilirubin, Direct: 0.1 mg/dL (ref 0.0–0.2)
Globulin: 2.9 g/dL (calc) (ref 1.9–3.7)
Indirect Bilirubin: 0.2 mg/dL (calc) (ref 0.2–1.2)
Total Bilirubin: 0.3 mg/dL (ref 0.2–1.2)
Total Protein: 6.5 g/dL (ref 6.1–8.1)

## 2019-11-15 LAB — LIPID PANEL
Cholesterol: 143 mg/dL (ref <200)
HDL: 61 mg/dL (ref >=50)
LDL Cholesterol (Calc): 68 mg/dL (calc)
Non-HDL Cholesterol (Calc): 82 mg/dL (calc) (ref <130)
Total CHOL/HDL Ratio: 2.3 (calc) (ref <5.0)
Triglycerides: 55 mg/dL (ref <150)

## 2019-11-15 LAB — TSH: TSH: 4.33 mIU/L (ref 0.40–4.50)

## 2019-11-15 MED ORDER — LEVOTHYROXINE SODIUM 75 MCG PO TABS: 75.0000 ug | ORAL_TABLET | Freq: Every day | ORAL | 1 refills | Status: DC

## 2019-11-17 ENCOUNTER — Other Ambulatory Visit: Payer: Self-pay | Admitting: Interventional Cardiology

## 2019-11-18 ENCOUNTER — Telehealth: Payer: Self-pay | Admitting: Physical Medicine and Rehabilitation

## 2019-11-18 NOTE — Telephone Encounter (Signed)
I think that is appropriate if she needed to be seen, otherwise could look at send in some type of medication if needed if she calls back. Dr. Paulla Fore should be able to see her if his hours are on Friday

## 2019-11-18 NOTE — Telephone Encounter (Signed)
FYI- I scheduled the patient for Monday 8/30 at 0915. She asked if she will have to "deal with this pain all weekend" and asked to come in today. I advised that she could call Dr. Nicolasa Ducking office or be seen in the ED if her pain is that severe. She did take the appointment for Monday.

## 2019-11-18 NOTE — Telephone Encounter (Signed)
Is auth needed for [id number]? Patient is scheduled for Monday 8/30.

## 2019-11-18 NOTE — Telephone Encounter (Signed)
Patient left message on triage phone stating that she needs to be seen immediately because she is in severe pain. She had bilateral S1 TF on 8/12. She state that she had a little relief (reports 80% relief with further questioning) for a few days. She states that the pain is in the same area. With left more than right pain and pain down left leg to foot. Please advise.

## 2019-11-18 NOTE — Telephone Encounter (Signed)
Try L5-S1 interlam esi sooner than later

## 2019-11-21 ENCOUNTER — Ambulatory Visit: Payer: Self-pay

## 2019-11-21 ENCOUNTER — Encounter: Payer: Self-pay | Admitting: Physical Medicine and Rehabilitation

## 2019-11-21 ENCOUNTER — Other Ambulatory Visit: Payer: Self-pay

## 2019-11-21 ENCOUNTER — Ambulatory Visit (INDEPENDENT_AMBULATORY_CARE_PROVIDER_SITE_OTHER): Payer: BC Managed Care – PPO | Admitting: Physical Medicine and Rehabilitation

## 2019-11-21 VITALS — BP 125/90 | HR 68

## 2019-11-21 DIAGNOSIS — M5416 Radiculopathy, lumbar region: Secondary | ICD-10-CM

## 2019-11-21 DIAGNOSIS — M5116 Intervertebral disc disorders with radiculopathy, lumbar region: Secondary | ICD-10-CM

## 2019-11-21 MED ORDER — METHYLPREDNISOLONE ACETATE 80 MG/ML IJ SUSP
80.0000 mg | Freq: Once | INTRAMUSCULAR | Status: AC
  Administered 2019-11-21: 80 mg

## 2019-11-21 NOTE — Procedures (Signed)
Lumbar Epidural Steroid Injection - Interlaminar Approach with Fluoroscopic Guidance  Patient: [name]      Date of Birth: 26-Sep-1955 MRN: [id number] PCP: Janith Lima, MD      Visit Date: 11/21/2019   Universal Protocol:     Consent Given By: the patient  Position: PRONE  Additional Comments: Vital signs were monitored before and after the procedure. Patient was prepped and draped in the usual sterile fashion. The correct patient, procedure, and site was verified.   Injection Procedure Details:  Procedure Site One Meds Administered:  Meds ordered this encounter  Medications  . methylPREDNISolone acetate (DEPO-MEDROL) injection 80 mg     Laterality: Left  Location/Site:  L5-S1  Needle size: 20 G  Needle type: Tuohy  Needle Placement: Paramedian epidural  Findings:   -Comments: Excellent flow of contrast into the epidural space.  Procedure Details: Using a paramedian approach from the side mentioned above, the region overlying the inferior lamina was localized under fluoroscopic visualization and the soft tissues overlying this structure were infiltrated with 4 ml. of 1% Lidocaine without Epinephrine. The Tuohy needle was inserted into the epidural space using a paramedian approach.   The epidural space was localized using loss of resistance along with lateral and bi-planar fluoroscopic views.  After negative aspirate for air, blood, and CSF, a 2 ml. volume of Isovue-250 was injected into the epidural space and the flow of contrast was observed. Radiographs were obtained for documentation purposes.    The injectate was administered into the level noted above.   Additional Comments:  The patient tolerated the procedure well Dressing: 2 x 2 sterile gauze and Band-Aid    Post-procedure details: Patient was observed during the procedure. Post-procedure instructions were reviewed.  Patient left the clinic in stable condition.

## 2019-11-21 NOTE — Progress Notes (Signed)
[name] - 64 y.o. female MRN [id number]  Date of birth: 07/04/1955  Office Visit Note: Visit Date: 11/21/2019 PCP: Janith Lima, MD Referred by: Janith Lima, MD  Subjective: Chief Complaint  Patient presents with  . Lower Back - Pain  . Left Hip - Pain   HPI:  [name] is a 64 y.o. female who comes in today at the request of Dr. Laurence Spates for planned Left L5-S1 Lumbar epidural steroid injection with fluoroscopic guidance.  The patient has failed conservative care including home exercise, medications, time and activity modification.  This injection will be diagnostic and hopefully therapeutic.  Please see requesting physician notes for further details and justification.  Referring: Teresa Coombs, DO  Review of Systems  Musculoskeletal: Positive for back pain.  All other systems reviewed and are negative.  Otherwise per HPI.  Assessment & Plan: Visit Diagnoses:  1. Lumbar radiculopathy   2. Radiculopathy due to lumbar intervertebral disc disorder     Plan: No additional findings.   Meds & Orders:  Meds ordered this encounter  Medications  . methylPREDNISolone acetate (DEPO-MEDROL) injection 80 mg    Orders Placed This Encounter  Procedures  . XR C-ARM NO REPORT  . Epidural Steroid injection    Follow-up: Return in about 2 weeks (around 12/05/2019) for Possible repeat.   Procedures: No procedures performed  Lumbar Epidural Steroid Injection - Interlaminar Approach with Fluoroscopic Guidance  Patient: [name]      Date of Birth: 1955/06/09 MRN: [id number] PCP: Janith Lima, MD      Visit Date: 11/21/2019   Universal Protocol:     Consent Given By: the patient  Position: PRONE  Additional Comments: Vital signs were monitored before and after the procedure. Patient was prepped and draped in the usual sterile fashion. The correct patient, procedure, and site was verified.   Injection Procedure Details:  Procedure  Site One Meds Administered:  Meds ordered this encounter  Medications  . methylPREDNISolone acetate (DEPO-MEDROL) injection 80 mg     Laterality: Left  Location/Site:  L5-S1  Needle size: 20 G  Needle type: Tuohy  Needle Placement: Paramedian epidural  Findings:   -Comments: Excellent flow of contrast into the epidural space.  Procedure Details: Using a paramedian approach from the side mentioned above, the region overlying the inferior lamina was localized under fluoroscopic visualization and the soft tissues overlying this structure were infiltrated with 4 ml. of 1% Lidocaine without Epinephrine. The Tuohy needle was inserted into the epidural space using a paramedian approach.   The epidural space was localized using loss of resistance along with lateral and bi-planar fluoroscopic views.  After negative aspirate for air, blood, and CSF, a 2 ml. volume of Isovue-250 was injected into the epidural space and the flow of contrast was observed. Radiographs were obtained for documentation purposes.    The injectate was administered into the level noted above.   Additional Comments:  The patient tolerated the procedure well Dressing: 2 x 2 sterile gauze and Band-Aid    Post-procedure details: Patient was observed during the procedure. Post-procedure instructions were reviewed.  Patient left the clinic in stable condition.    Clinical History: Low back pain with right leg pain  EXAM: MRI LUMBAR SPINE WITHOUT CONTRAST  TECHNIQUE: Multiplanar, multisequence MR imaging of the lumbar spine was performed. No intravenous contrast was administered.  COMPARISON:  Lumbar MRI 08/31/2016  FINDINGS: Segmentation:  Normal  Alignment:  Mild retrolisthesis L1-2, L2-3,  L3-4.  Vertebrae: Negative for fracture or mass. Discogenic changes on the left at L5-S1 have progressed in the interval.  Conus medullaris and cauda equina: Conus extends to the L1-2 level. Conus and  cauda equina appear normal.  Paraspinal and other soft tissues: Negative for paraspinous mass, adenopathy, or fluid collection.  Disc levels:  L1-2: Mild degenerative change.  Negative for stenosis  L2-3: Mild facet degeneration.  Negative for stenosis  L3-4: Mild to moderate facet degeneration. Mild disc bulging. No significant stenosis.  L4-5: Severe facet degeneration bilaterally with facet joint overgrowth and bilateral facet joint effusions. This is similar to the prior study. There is diffuse bulging of the disc. Mild spinal stenosis and moderate subarticular stenosis similar to the prior study.  L5-S1: Progressive disc degeneration and disc space narrowing. Sclerotic endplate changes and spurring on the left have progressed since the prior study. There is bilateral facet hypertrophy. There is moderate subarticular stenosis on the left which has progressed in the interval. Left paracentral disc protrusion shows significant interval improvement. There is improved mass effect on left S1 nerve root compared with the prior study.  IMPRESSION: Mild spinal stenosis and moderate subarticular stenosis bilaterally L4-5 unchanged. Severe facet degeneration L4-5  Improvement in left-sided disc protrusion L5-S1 with less mass effect on the S1 nerve root. There is progressive disc degeneration and spurring on the left at L5-S1 with progression of subarticular stenosis on the left due to spurring.   Electronically Signed   By: Franchot Gallo M.D.   On: 10/13/2019 15:05     Objective:  VS:  HT:    WT:   BMI:     BP:125/90  HR:68bpm  TEMP: ( )  RESP:  Physical Exam Constitutional:      General: She is not in acute distress.    Appearance: Normal appearance. She is not ill-appearing.  HENT:     Head: Normocephalic and atraumatic.     Right Ear: External ear normal.     Left Ear: External ear normal.  Eyes:     Extraocular Movements: Extraocular movements  intact.  Cardiovascular:     Rate and Rhythm: Normal rate.     Pulses: Normal pulses.  Musculoskeletal:     Right lower leg: No edema.     Left lower leg: No edema.     Comments: Patient has good distal strength with no pain over the greater trochanters.  No clonus or focal weakness.  Skin:    Findings: No erythema, lesion or rash.  Neurological:     General: No focal deficit present.     Mental Status: She is alert and oriented to person, place, and time.     Sensory: No sensory deficit.     Motor: No weakness or abnormal muscle tone.     Coordination: Coordination normal.  Psychiatric:        Mood and Affect: Mood normal.        Behavior: Behavior normal.      Imaging: XR C-ARM NO REPORT  Result Date: 11/21/2019 Please see Notes tab for imaging impression.

## 2019-11-21 NOTE — Progress Notes (Signed)
.  Numeric Pain Rating Scale and Functional Assessment Average Pain 9   In the last MONTH (on 0-10 scale) has pain interfered with the following?  1. General activity like being  able to carry out your everyday physical activities such as walking, climbing stairs, carrying groceries, or moving a chair?  Rating(8)   +Driver, -BT, -Dye Allergies.  

## 2019-12-12 ENCOUNTER — Ambulatory Visit (INDEPENDENT_AMBULATORY_CARE_PROVIDER_SITE_OTHER): Payer: BC Managed Care – PPO | Admitting: Physical Medicine and Rehabilitation

## 2019-12-12 ENCOUNTER — Other Ambulatory Visit: Payer: Self-pay

## 2019-12-12 ENCOUNTER — Ambulatory Visit: Payer: Self-pay

## 2019-12-12 VITALS — BP 124/92 | HR 74

## 2019-12-12 DIAGNOSIS — M5116 Intervertebral disc disorders with radiculopathy, lumbar region: Secondary | ICD-10-CM

## 2019-12-12 DIAGNOSIS — M5416 Radiculopathy, lumbar region: Secondary | ICD-10-CM

## 2019-12-12 MED ORDER — METHYLPREDNISOLONE ACETATE 80 MG/ML IJ SUSP
80.0000 mg | Freq: Once | INTRAMUSCULAR | Status: AC
  Administered 2019-12-12: 80 mg

## 2019-12-12 NOTE — Procedures (Signed)
Lumbar Epidural Steroid Injection - Interlaminar Approach with Fluoroscopic Guidance  Patient: [name]      Date of Birth: March 12, 1956 MRN: [id number] PCP: Janith Lima, MD      Visit Date: 12/12/2019   Universal Protocol:     Consent Given By: the patient  Position: PRONE  Additional Comments: Vital signs were monitored before and after the procedure. Patient was prepped and draped in the usual sterile fashion. The correct patient, procedure, and site was verified.   Injection Procedure Details:  Procedure Site One Meds Administered:  Meds ordered this encounter  Medications  . methylPREDNISolone acetate (DEPO-MEDROL) injection 80 mg     Laterality: Left  Location/Site:  L5-S1  Needle size: 20 G  Needle type: Tuohy  Needle Placement: Paramedian epidural  Findings:   -Comments: Excellent flow of contrast into the epidural space.  Procedure Details: Using a paramedian approach from the side mentioned above, the region overlying the inferior lamina was localized under fluoroscopic visualization and the soft tissues overlying this structure were infiltrated with 4 ml. of 1% Lidocaine without Epinephrine. The Tuohy needle was inserted into the epidural space using a paramedian approach.   The epidural space was localized using loss of resistance along with lateral and bi-planar fluoroscopic views.  After negative aspirate for air, blood, and CSF, a 2 ml. volume of Isovue-250 was injected into the epidural space and the flow of contrast was observed. Radiographs were obtained for documentation purposes.    The injectate was administered into the level noted above.   Additional Comments:  The patient tolerated the procedure well Dressing: 2 x 2 sterile gauze and Band-Aid    Post-procedure details: Patient was observed during the procedure. Post-procedure instructions were reviewed.  Patient left the clinic in stable condition.

## 2019-12-12 NOTE — Progress Notes (Signed)
Pt state Lower back pain and left leg. Pt state walking for a long period of time. Pt state heat pad ,sitting to rest and pain meds helps ease the pain. Pt has hx of inj on 11/21/19 pt state it felt better than the first one. Pt state the pain started to return about three days ago.  Numeric Pain Rating Scale and Functional Assessment Average Pain 5   In the last MONTH (on 0-10 scale) has pain interfered with the following?  1. General activity like being  able to carry out your everyday physical activities such as walking, climbing stairs, carrying groceries, or moving a chair?  Rating(8)   +Driver, -BT, -Dye Allergies.

## 2019-12-12 NOTE — Progress Notes (Signed)
[name] - 64 y.o. female MRN [id number]  Date of birth: Jan 29, 1956  Office Visit Note: Visit Date: 12/12/2019 PCP: Janith Lima, MD Referred by: Janith Lima, MD  Subjective: Chief Complaint  Patient presents with  . Lower Back - Pain  . Left Leg - Pain   HPI:  [name] is a 64 y.o. female who comes in today for planned repeat Left L5-S1 Lumbar epidural steroid injection with fluoroscopic guidance.  The patient has failed conservative care including home exercise, medications, time and activity modification.  This injection will be diagnostic and hopefully therapeutic.  Please see requesting physician notes for further details and justification. Patient received more than 50% pain relief from prior injection.   Referring: Dr. Teresa Coombs   Consider diagnostic facet joint blocks.  ROS Otherwise per HPI.  Assessment & Plan: Visit Diagnoses:  1. Lumbar radiculopathy   2. Radiculopathy due to lumbar intervertebral disc disorder     Plan: No additional findings.   Meds & Orders:  Meds ordered this encounter  Medications  . methylPREDNISolone acetate (DEPO-MEDROL) injection 80 mg    Orders Placed This Encounter  Procedures  . XR C-ARM NO REPORT  . Epidural Steroid injection    Follow-up: Return for visit to requesting physician as needed.   Procedures: No procedures performed  Lumbar Epidural Steroid Injection - Interlaminar Approach with Fluoroscopic Guidance  Patient: [name]      Date of Birth: 04-09-1955 MRN: [id number] PCP: Janith Lima, MD      Visit Date: 12/12/2019   Universal Protocol:     Consent Given By: the patient  Position: PRONE  Additional Comments: Vital signs were monitored before and after the procedure. Patient was prepped and draped in the usual sterile fashion. The correct patient, procedure, and site was verified.   Injection Procedure Details:  Procedure Site One Meds Administered:  Meds  ordered this encounter  Medications  . methylPREDNISolone acetate (DEPO-MEDROL) injection 80 mg     Laterality: Left  Location/Site:  L5-S1  Needle size: 20 G  Needle type: Tuohy  Needle Placement: Paramedian epidural  Findings:   -Comments: Excellent flow of contrast into the epidural space.  Procedure Details: Using a paramedian approach from the side mentioned above, the region overlying the inferior lamina was localized under fluoroscopic visualization and the soft tissues overlying this structure were infiltrated with 4 ml. of 1% Lidocaine without Epinephrine. The Tuohy needle was inserted into the epidural space using a paramedian approach.   The epidural space was localized using loss of resistance along with lateral and bi-planar fluoroscopic views.  After negative aspirate for air, blood, and CSF, a 2 ml. volume of Isovue-250 was injected into the epidural space and the flow of contrast was observed. Radiographs were obtained for documentation purposes.    The injectate was administered into the level noted above.   Additional Comments:  The patient tolerated the procedure well Dressing: 2 x 2 sterile gauze and Band-Aid    Post-procedure details: Patient was observed during the procedure. Post-procedure instructions were reviewed.  Patient left the clinic in stable condition.     Clinical History: Low back pain with right leg pain  EXAM: MRI LUMBAR SPINE WITHOUT CONTRAST  TECHNIQUE: Multiplanar, multisequence MR imaging of the lumbar spine was performed. No intravenous contrast was administered.  COMPARISON:  Lumbar MRI 08/31/2016  FINDINGS: Segmentation:  Normal  Alignment:  Mild retrolisthesis L1-2, L2-3, L3-4.  Vertebrae: Negative for fracture  or mass. Discogenic changes on the left at L5-S1 have progressed in the interval.  Conus medullaris and cauda equina: Conus extends to the L1-2 level. Conus and cauda equina appear  normal.  Paraspinal and other soft tissues: Negative for paraspinous mass, adenopathy, or fluid collection.  Disc levels:  L1-2: Mild degenerative change.  Negative for stenosis  L2-3: Mild facet degeneration.  Negative for stenosis  L3-4: Mild to moderate facet degeneration. Mild disc bulging. No significant stenosis.  L4-5: Severe facet degeneration bilaterally with facet joint overgrowth and bilateral facet joint effusions. This is similar to the prior study. There is diffuse bulging of the disc. Mild spinal stenosis and moderate subarticular stenosis similar to the prior study.  L5-S1: Progressive disc degeneration and disc space narrowing. Sclerotic endplate changes and spurring on the left have progressed since the prior study. There is bilateral facet hypertrophy. There is moderate subarticular stenosis on the left which has progressed in the interval. Left paracentral disc protrusion shows significant interval improvement. There is improved mass effect on left S1 nerve root compared with the prior study.  IMPRESSION: Mild spinal stenosis and moderate subarticular stenosis bilaterally L4-5 unchanged. Severe facet degeneration L4-5  Improvement in left-sided disc protrusion L5-S1 with less mass effect on the S1 nerve root. There is progressive disc degeneration and spurring on the left at L5-S1 with progression of subarticular stenosis on the left due to spurring.   Electronically Signed   By: Franchot Gallo M.D.   On: 10/13/2019 15:05     Objective:  VS:  HT:    WT:   BMI:     BP:(!) 124/92  HR:74bpm  TEMP: ( )  RESP:  Physical Exam Constitutional:      General: She is not in acute distress.    Appearance: Normal appearance. She is obese. She is not ill-appearing.  HENT:     Head: Normocephalic and atraumatic.     Right Ear: External ear normal.     Left Ear: External ear normal.  Eyes:     Extraocular Movements: Extraocular movements  intact.  Cardiovascular:     Rate and Rhythm: Normal rate.     Pulses: Normal pulses.  Musculoskeletal:     Right lower leg: No edema.     Left lower leg: No edema.     Comments: Patient has good distal strength with no pain over the greater trochanters.  No clonus or focal weakness.  Skin:    Findings: No erythema, lesion or rash.  Neurological:     General: No focal deficit present.     Mental Status: She is alert and oriented to person, place, and time.     Sensory: No sensory deficit.     Motor: No weakness or abnormal muscle tone.     Coordination: Coordination normal.  Psychiatric:        Mood and Affect: Mood normal.        Behavior: Behavior normal.      Imaging: No results found.

## 2019-12-15 ENCOUNTER — Other Ambulatory Visit: Payer: Self-pay | Admitting: Interventional Cardiology

## 2020-01-27 DIAGNOSIS — Z6841 Body Mass Index (BMI) 40.0 and over, adult: Secondary | ICD-10-CM | POA: Diagnosis not present

## 2020-01-27 DIAGNOSIS — Z79899 Other long term (current) drug therapy: Secondary | ICD-10-CM | POA: Diagnosis not present

## 2020-01-27 DIAGNOSIS — M329 Systemic lupus erythematosus, unspecified: Secondary | ICD-10-CM | POA: Diagnosis not present

## 2020-01-27 DIAGNOSIS — M3214 Glomerular disease in systemic lupus erythematosus: Secondary | ICD-10-CM | POA: Diagnosis not present

## 2020-02-13 ENCOUNTER — Telehealth: Payer: Self-pay | Admitting: *Deleted

## 2020-02-13 NOTE — Telephone Encounter (Signed)
   [facility] Pre-operative Risk Assessment    HEARTCARE STAFF: - Please ensure there is not already an duplicate clearance open for this procedure. - Under Visit Info/Reason for Call, type in Other and utilize the format Clearance MM/DD/YY or Clearance TBD. Do not use dashes or single digits. - If request is for dental extraction, please clarify the # of teeth to be extracted.  Request for surgical clearance:  1. What type of surgery is being performed? COLONOSCOPY   2. When is this surgery scheduled? 02/24/20   3. What type of clearance is required (medical clearance vs. Pharmacy clearance to hold med vs. Both)? MEDICAL  4. Are there any medications that need to be held prior to surgery and how long? NONE LISTED   5. Practice name and name of physician performing surgery? [facility]; DR. MANN   6. What is the office phone number? [phone]   7.   What is the office fax number? [phone]  8.   Anesthesia type (None, local, MAC, general) ? PROPOFOL   Julaine Hua 02/13/2020, 1:38 PM  _________________________________________________________________   (provider comments below)

## 2020-02-13 NOTE — Telephone Encounter (Signed)
   Primary Cardiologist: Sinclair Grooms, MD  Chart reviewed as part of pre-operative protocol coverage. Patient was last seen by Dr. Tamala Julian in 10/2019 at which time she was doing well from a cardiac standpoint. Patient was contacted today for further pre-op evaluation at which time she reported doing well since last visit. No chest pain, shortness of breath, orthopnea, PND, palpitations, lightheadedness, dizziness, or syncope. She is able to complete >4.0 METS without any problems. Given past medical history and time since last visit, based on ACC/AHA guidelines, [name] would be at acceptable risk for the planned procedure without further cardiovascular testing.   I will route this recommendation to the requesting party via Epic fax function and remove from pre-op pool.  Please call with questions.  Darreld Mclean, PA-C 02/13/2020, 2:44 PM

## 2020-03-13 ENCOUNTER — Other Ambulatory Visit: Payer: Self-pay | Admitting: Interventional Cardiology

## 2020-04-02 ENCOUNTER — Encounter: Payer: Self-pay | Admitting: Nurse Practitioner

## 2020-04-02 ENCOUNTER — Ambulatory Visit: Payer: BC Managed Care – PPO | Admitting: Internal Medicine

## 2020-04-02 ENCOUNTER — Telehealth (INDEPENDENT_AMBULATORY_CARE_PROVIDER_SITE_OTHER): Payer: BC Managed Care – PPO | Admitting: Nurse Practitioner

## 2020-04-02 VITALS — BP 102/64 | HR 73 | Temp 97.6°F | Ht 67.0 in | Wt 250.0 lb

## 2020-04-02 DIAGNOSIS — T50B95A Adverse effect of other viral vaccines, initial encounter: Secondary | ICD-10-CM | POA: Diagnosis not present

## 2020-04-02 DIAGNOSIS — H1031 Unspecified acute conjunctivitis, right eye: Secondary | ICD-10-CM

## 2020-04-02 MED ORDER — CIPROFLOXACIN HCL 0.3 % OP SOLN: 1.0000 [drp] | OPHTHALMIC | 0 refills | Status: AC

## 2020-04-02 NOTE — Progress Notes (Signed)
Virtual Visit via Video Note  I connected with@ on 04/02/20 at  1:00 PM EST by a video enabled telemedicine application and verified that I am speaking with the correct person using two identifiers.  Location: Patient:Home Provider: Office Participants: patient and provider  I discussed the limitations of evaluation and management by telemedicine and the availability of in person appointments. I also discussed with the patient that there may be a patient responsible charge related to this service. The patient expressed understanding and agreed to proceed.  CC:Pt states last night she had a slightly elevated temp. Of 99.5 and was experiencing some light headedness, nausea, and heart palpitations but today she does not have any of those symptoms. Pt states the symptoms started after her booster vaccine on Saturday.   History of Present Illness: [name] states under the guidance of her rheumatologist, she took 4 COVID vaccine doses (Moderna): 03/31/19, 04/29/19, 11/04/19, and 03/31/20. She developed myalgia, fever, dizziness and palpitations within 24hrs of last dose. This symptoms have now resolved with use of tylenol, rest and hydration. Eye Problem  The right eye is affected. This is a new problem. The current episode started yesterday. The problem occurs constantly. The problem has been unchanged. There was no injury mechanism. The patient is experiencing no pain. There is no known exposure to pink eye. She does not wear contacts. Associated symptoms include an eye discharge, eye redness, a foreign body sensation and itching. Pertinent negatives include no blurred vision, double vision, fever, nausea, photophobia, recent URI or vomiting. She has tried nothing for the symptoms.   Observations/Objective: Physical Exam Vitals reviewed.  Eyes:     General:        Right eye: Discharge present.        Left eye: No discharge.     Extraocular Movements: Extraocular movements intact.  Cardiovascular:      Rate and Rhythm: Normal rate.     Pulses: Normal pulses.  Pulmonary:     Effort: Pulmonary effort is normal.  Neurological:     Mental Status: She is alert.  Psychiatric:        Mood and Affect: Mood normal.        Behavior: Behavior normal.    Assessment and Plan: Meghan was seen today for acute visit.  Diagnoses and all orders for this visit:  Acute conjunctivitis of right eye, unspecified acute conjunctivitis type -     ciprofloxacin (CILOXAN) 0.3 % ophthalmic solution; Place 1 drop into the right eye every 4 (four) hours while awake for 7 days.  Adverse reaction to COVID-19 vaccine   Follow Up Instructions: Continue supportive care. Do not take another COVID vaccine dose   I discussed the assessment and treatment plan with the patient. The patient was provided an opportunity to ask questions and all were answered. The patient agreed with the plan and demonstrated an understanding of the instructions.   The patient was advised to call back or seek an in-person evaluation if the symptoms worsen or if the condition fails to improve as anticipated.  Wilfred Lacy, NP

## 2020-04-11 ENCOUNTER — Other Ambulatory Visit: Payer: Self-pay | Admitting: Gastroenterology

## 2020-04-25 ENCOUNTER — Encounter (HOSPITAL_COMMUNITY): Payer: Self-pay | Admitting: Gastroenterology

## 2020-04-25 ENCOUNTER — Other Ambulatory Visit: Payer: Self-pay

## 2020-05-14 ENCOUNTER — Ambulatory Visit: Payer: BC Managed Care – PPO

## 2020-05-18 ENCOUNTER — Ambulatory Visit: Payer: BC Managed Care – PPO

## 2020-05-21 ENCOUNTER — Other Ambulatory Visit: Payer: Self-pay | Admitting: Internal Medicine

## 2020-05-21 DIAGNOSIS — E039 Hypothyroidism, unspecified: Secondary | ICD-10-CM

## 2020-05-24 ENCOUNTER — Telehealth: Payer: Self-pay | Admitting: Physical Medicine and Rehabilitation

## 2020-05-24 NOTE — Telephone Encounter (Signed)
Patient called needing an appointment with Dr Ernestina Patches for her back. The number to contact patient is [phone]

## 2020-05-24 NOTE — Telephone Encounter (Signed)
ok 

## 2020-05-24 NOTE — Telephone Encounter (Signed)
Pt not req Auth# for repeat.

## 2020-05-24 NOTE — Telephone Encounter (Signed)
Would do L4-5 interlam and I can talk to her at that time about changes

## 2020-05-24 NOTE — Telephone Encounter (Signed)
Left L5-S1 IL 12/12/2019. Ok to repeat if helped, same problem/side, and no new injury?

## 2020-05-24 NOTE — Telephone Encounter (Signed)
Pt is dealing with the same pain but on the both side and no injury.

## 2020-05-24 NOTE — Telephone Encounter (Signed)
Called pt and sch 3/10

## 2020-05-24 NOTE — Telephone Encounter (Signed)
Left message #1

## 2020-05-31 ENCOUNTER — Other Ambulatory Visit: Payer: Self-pay

## 2020-05-31 ENCOUNTER — Ambulatory Visit (INDEPENDENT_AMBULATORY_CARE_PROVIDER_SITE_OTHER): Payer: BC Managed Care – PPO | Admitting: Physical Medicine and Rehabilitation

## 2020-05-31 ENCOUNTER — Encounter: Payer: Self-pay | Admitting: Physical Medicine and Rehabilitation

## 2020-05-31 ENCOUNTER — Ambulatory Visit: Payer: Self-pay

## 2020-05-31 VITALS — BP 127/85 | HR 66

## 2020-05-31 DIAGNOSIS — Z6841 Body Mass Index (BMI) 40.0 and over, adult: Secondary | ICD-10-CM | POA: Insufficient documentation

## 2020-05-31 DIAGNOSIS — G8929 Other chronic pain: Secondary | ICD-10-CM

## 2020-05-31 DIAGNOSIS — M545 Low back pain, unspecified: Secondary | ICD-10-CM

## 2020-05-31 DIAGNOSIS — K5904 Chronic idiopathic constipation: Secondary | ICD-10-CM | POA: Insufficient documentation

## 2020-05-31 DIAGNOSIS — K449 Diaphragmatic hernia without obstruction or gangrene: Secondary | ICD-10-CM | POA: Insufficient documentation

## 2020-05-31 DIAGNOSIS — M47816 Spondylosis without myelopathy or radiculopathy, lumbar region: Secondary | ICD-10-CM | POA: Diagnosis not present

## 2020-05-31 MED ORDER — BETAMETHASONE SOD PHOS & ACET 6 (3-3) MG/ML IJ SUSP
12.0000 mg | Freq: Once | INTRAMUSCULAR | Status: AC
  Administered 2020-05-31: 12 mg

## 2020-05-31 NOTE — Procedures (Signed)
Lumbar Diagnostic Facet Joint Nerve Block with Fluoroscopic Guidance   Patient: [name]      Date of Birth: 1955-10-08 MRN: [id number] PCP: Janith Lima, MD      Visit Date: 05/31/2020   Universal Protocol:    Date/Time: 03/10/229:00 AM  Consent Given By: the patient  Position: PRONE  Additional Comments: Vital signs were monitored before and after the procedure. Patient was prepped and draped in the usual sterile fashion. The correct patient, procedure, and site was verified.   Injection Procedure Details:   Procedure diagnoses:  1. Lumbar radiculopathy      Meds Administered:  Meds ordered this encounter  Medications  . betamethasone acetate-betamethasone sodium phosphate (CELESTONE) injection 12 mg     Laterality: Bilateral  Location/Site: L4-L5, L3 and L4 medial branches  Needle: 5.0 in., 25 ga.  Short bevel or Quincke spinal needle  Needle Placement: Oblique pedical  Findings:   -Comments: There was excellent flow of contrast along the articular pillars without intravascular flow.  Procedure Details: The fluoroscope beam is vertically oriented in AP and then obliqued 15 to 20 degrees to the ipsilateral side of the desired nerve to achieve the "Scotty dog" appearance.  The skin over the target area of the junction of the superior articulating process and the transverse process (sacral ala if blocking the L5 dorsal rami) was locally anesthetized with a 1 ml volume of 1% Lidocaine without Epinephrine.  The spinal needle was inserted and advanced in a trajectory view down to the target.   After contact with periosteum and negative aspirate for blood and CSF, correct placement without intravascular or epidural spread was confirmed by injecting 0.5 ml. of Isovue-250.  A spot radiograph was obtained of this image.    Next, a 0.5 ml. volume of the injectate described above was injected. The needle was then redirected to the other facet joint nerves  mentioned above if needed.  Prior to the procedure, the patient was given a Pain Diary which was completed for baseline measurements.  After the procedure, the patient rated their pain every 30 minutes and will continue rating at this frequency for a total of 5 hours.  The patient has been asked to complete the Diary and return to [name] by mail, fax or hand delivered as soon as possible.   Additional Comments:  The patient tolerated the procedure well Dressing: 2 x 2 sterile gauze and Band-Aid    Post-procedure details: Patient was observed during the procedure. Post-procedure instructions were reviewed.  Patient left the clinic in stable condition.

## 2020-05-31 NOTE — Progress Notes (Signed)
[name] - 65 y.o. female MRN [id number]  Date of birth: 25-Nov-1955  Office Visit Note: Visit Date: 05/31/2020 PCP: Janith Lima, MD Referred by: Janith Lima, MD  Subjective: Chief Complaint  Patient presents with  . Lower Back - Pain  . Left Leg - Pain  . Right Leg - Pain   HPI:  [name] is a 65 y.o. female who comes in today For evaluation management of chronic worsening severe axial low back pain with some referral pattern into the hips and thighs left more than right.  She has a history of disc herniation at L5-S1 with prior epidural injections a few years ago that were really very beneficial.  Last epidural injection was performed in September and she did get several weeks of some relief but her main complaint has been her back pain.  She followed up with Dr. Teresa Coombs who has referred her to Dr. Yvetta Coder low for chiropractic care.  She went through several sessions of that including laser treatment and manipulation etc. without much relief.  She continues with current medication plans without opioid treatment.  She still stays active and does do home exercise.  Her case is complicated by obesity and she is trying to work on that.  Her biggest complaint is the axial back pain worse with standing better at rest.  Review of Systems  Musculoskeletal: Positive for back pain.  All other systems reviewed and are negative.  Otherwise per HPI.  Assessment & Plan: Visit Diagnoses:    ICD-10-CM   1. Spondylosis without myelopathy or radiculopathy, lumbar region  M47.816 XR C-ARM NO REPORT    betamethasone acetate-betamethasone sodium phosphate (CELESTONE) injection 12 mg    Facet Injection  2. Chronic bilateral low back pain without sciatica  M54.50    G89.29     Plan: Findings:  Plan today is diagnostic and hopefully therapeutic L4-5 facet joint/medial branch blocks.  If she does well and gets more than 70% relief we would look down the road at  radiofrequency ablation.  If she does not get much relief I would repeat the epidural injection and try to regroup with continued exercises and maybe medication change and she can follow-up with Dr. Teresa Coombs for that as well.    Meds & Orders:  Meds ordered this encounter  Medications  . betamethasone acetate-betamethasone sodium phosphate (CELESTONE) injection 12 mg    Orders Placed This Encounter  Procedures  . Facet Injection  . XR C-ARM NO REPORT    Follow-up: Return if symptoms worsen or fail to improve.   Procedures: No procedures performed  Lumbar Diagnostic Facet Joint Nerve Block with Fluoroscopic Guidance   Patient: [name]      Date of Birth: [birth year] MRN: [id number] PCP: Janith Lima, MD      Visit Date: 05/31/2020   Universal Protocol:    Date/Time: 03/10/229:00 AM  Consent Given By: the patient  Position: PRONE  Additional Comments: Vital signs were monitored before and after the procedure. Patient was prepped and draped in the usual sterile fashion. The correct patient, procedure, and site was verified.   Injection Procedure Details:   Procedure diagnoses:  1. Lumbar radiculopathy      Meds Administered:  Meds ordered this encounter  Medications  . betamethasone acetate-betamethasone sodium phosphate (CELESTONE) injection 12 mg     Laterality: Bilateral  Location/Site: L4-L5, L3 and L4 medial branches  Needle: 5.0 in., 25 ga.  Short bevel or  Quincke spinal needle  Needle Placement: Oblique pedical  Findings:   -Comments: There was excellent flow of contrast along the articular pillars without intravascular flow.  Procedure Details: The fluoroscope beam is vertically oriented in AP and then obliqued 15 to 20 degrees to the ipsilateral side of the desired nerve to achieve the "Scotty dog" appearance.  The skin over the target area of the junction of the superior articulating process and the transverse process (sacral ala  if blocking the L5 dorsal rami) was locally anesthetized with a 1 ml volume of 1% Lidocaine without Epinephrine.  The spinal needle was inserted and advanced in a trajectory view down to the target.   After contact with periosteum and negative aspirate for blood and CSF, correct placement without intravascular or epidural spread was confirmed by injecting 0.5 ml. of Isovue-250.  A spot radiograph was obtained of this image.    Next, a 0.5 ml. volume of the injectate described above was injected. The needle was then redirected to the other facet joint nerves mentioned above if needed.  Prior to the procedure, the patient was given a Pain Diary which was completed for baseline measurements.  After the procedure, the patient rated their pain every 30 minutes and will continue rating at this frequency for a total of 5 hours.  The patient has been asked to complete the Diary and return to [name] by mail, fax or hand delivered as soon as possible.   Additional Comments:  The patient tolerated the procedure well Dressing: 2 x 2 sterile gauze and Band-Aid    Post-procedure details: Patient was observed during the procedure. Post-procedure instructions were reviewed.  Patient left the clinic in stable condition.     Clinical History: Low back pain with right leg pain  EXAM: MRI LUMBAR SPINE WITHOUT CONTRAST  TECHNIQUE: Multiplanar, multisequence MR imaging of the lumbar spine was performed. No intravenous contrast was administered.  COMPARISON:  Lumbar MRI 08/31/2016  FINDINGS: Segmentation:  Normal  Alignment:  Mild retrolisthesis L1-2, L2-3, L3-4.  Vertebrae: Negative for fracture or mass. Discogenic changes on the left at L5-S1 have progressed in the interval.  Conus medullaris and cauda equina: Conus extends to the L1-2 level. Conus and cauda equina appear normal.  Paraspinal and other soft tissues: Negative for paraspinous mass, adenopathy, or fluid collection.  Disc  levels:  L1-2: Mild degenerative change.  Negative for stenosis  L2-3: Mild facet degeneration.  Negative for stenosis  L3-4: Mild to moderate facet degeneration. Mild disc bulging. No significant stenosis.  L4-5: Severe facet degeneration bilaterally with facet joint overgrowth and bilateral facet joint effusions. This is similar to the prior study. There is diffuse bulging of the disc. Mild spinal stenosis and moderate subarticular stenosis similar to the prior study.  L5-S1: Progressive disc degeneration and disc space narrowing. Sclerotic endplate changes and spurring on the left have progressed since the prior study. There is bilateral facet hypertrophy. There is moderate subarticular stenosis on the left which has progressed in the interval. Left paracentral disc protrusion shows significant interval improvement. There is improved mass effect on left S1 nerve root compared with the prior study.  IMPRESSION: Mild spinal stenosis and moderate subarticular stenosis bilaterally L4-5 unchanged. Severe facet degeneration L4-5  Improvement in left-sided disc protrusion L5-S1 with less mass effect on the S1 nerve root. There is progressive disc degeneration and spurring on the left at L5-S1 with progression of subarticular stenosis on the left due to spurring.   Electronically Signed  By: Franchot Gallo M.D.   On: 10/13/2019 15:05     Objective:  VS:  HT:    WT:   BMI:     BP:127/85  HR:66bpm  TEMP: ( )  RESP:  Physical Exam Vitals and nursing note reviewed.  Constitutional:      General: She is not in acute distress.    Appearance: Normal appearance. She is obese. She is not ill-appearing.  HENT:     Head: Normocephalic and atraumatic.     Right Ear: External ear normal.     Left Ear: External ear normal.  Eyes:     Extraocular Movements: Extraocular movements intact.  Cardiovascular:     Rate and Rhythm: Normal rate.     Pulses: Normal pulses.   Pulmonary:     Effort: Pulmonary effort is normal. No respiratory distress.  Abdominal:     General: There is no distension.     Palpations: Abdomen is soft.  Musculoskeletal:        General: Tenderness present.     Cervical back: Neck supple.     Right lower leg: No edema.     Left lower leg: No edema.     Comments: Patient has good distal strength with no pain over the greater trochanters.  No clonus or focal weakness.  Skin:    Findings: No erythema, lesion or rash.  Neurological:     General: No focal deficit present.     Mental Status: She is alert and oriented to person, place, and time.     Sensory: No sensory deficit.     Motor: No weakness or abnormal muscle tone.     Coordination: Coordination normal.  Psychiatric:        Mood and Affect: Mood normal.        Behavior: Behavior normal.      Imaging: XR C-ARM NO REPORT  Result Date: 05/31/2020 Please see Notes tab for imaging impression.

## 2020-05-31 NOTE — Progress Notes (Signed)
Pt state lower back that travels down both legs. Pt state walking and standing makes the pain worse. Pt state when laying in bed and turn on each side she feels a sharp and tingling pain. Pt state she take over the counter pain meds to help ease the pain. Pt has hx of inj on 12/12/19 pt state it helped for a few weeks woth a 60% relief.  Numeric Pain Rating Scale and Functional Assessment Average Pain 10   In the last MONTH (on 0-10 scale) has pain interfered with the following?  1. General activity like being  able to carry out your everyday physical activities such as walking, climbing stairs, carrying groceries, or moving a chair?  Rating(10)   +Driver, -BT, -Dye Allergies.

## 2020-05-31 NOTE — Patient Instructions (Signed)

## 2020-06-08 ENCOUNTER — Other Ambulatory Visit: Payer: Self-pay

## 2020-06-08 ENCOUNTER — Ambulatory Visit: Payer: BC Managed Care – PPO

## 2020-06-08 ENCOUNTER — Ambulatory Visit (INDEPENDENT_AMBULATORY_CARE_PROVIDER_SITE_OTHER): Payer: BC Managed Care – PPO

## 2020-06-08 VITALS — BP 120/70 | HR 68 | Temp 98.1°F | Ht 67.0 in | Wt 260.0 lb

## 2020-06-08 DIAGNOSIS — Z0000 Encounter for general adult medical examination without abnormal findings: Secondary | ICD-10-CM | POA: Diagnosis not present

## 2020-06-08 NOTE — Progress Notes (Signed)
Subjective:   Rickiya Picariello is a 65 y.o. female who presents for Medicare Annual (Subsequent) preventive examination.  Review of Systems    No ROS. Medicare Wellness Visit. Additional risk factors are reflected in social history. Cardiac Risk Factors include: family history of premature cardiovascular disease;dyslipidemia;obesity (BMI >30kg/m2)     Objective:    Today's Vitals   06/08/20 1503  BP: 120/70  Pulse: 68  Temp: 98.1 F (36.7 C)  SpO2: 99%  Weight: 260 lb (117.9 kg)  PainSc: 0-No pain   Body mass index is 40.72 kg/m.  Advanced Directives 06/08/2020 02/02/2018 02/01/2018 02/01/2018 01/27/2017 01/24/2017 01/17/2017  Does Patient Have a Medical Advance Directive? Yes Yes Yes No No No No  Type of Advance Directive - - [place];Living will - - - -  Does patient want to make changes to medical advance directive? No - Patient declined No - Patient declined - - - - -  Copy of Press photographer in Chart? - - Yes - validated most recent copy scanned in chart (See row information) - - - -  Would patient like information on creating a medical advance directive? - - No - Patient declined No - Patient declined No - Patient declined No - Patient declined No - Patient declined  Pre-existing out of facility DNR order (yellow form or pink MOST form) - - - - - - -    Current Medications (verified) Outpatient Encounter Medications as of 06/08/2020  Medication Sig  . amLODipine (NORVASC) 5 MG tablet TAKE 1 TABLET BY MOUTH EVERY DAY  . azaTHIOprine (IMURAN) 50 MG tablet Take 50 mg by mouth 2 (two) times daily.  . cyclobenzaprine (FLEXERIL) 5 MG tablet Take 5 mg by mouth 3 (three) times daily as needed.  . hydroxychloroquine (PLAQUENIL) 200 MG tablet Take 200 mg by mouth daily.   Marland Kitchen levothyroxine (SYNTHROID) 75 MCG tablet Take 1 tablet (75 mcg total) by mouth daily.  . metoprolol succinate (TOPROL-XL) 50 MG 24 hr tablet Take 1 tablet (50 mg total) by  mouth daily.  . pantoprazole (PROTONIX) 40 MG tablet TAKE 1 TABLET BY MOUTH EVERY DAY  . rosuvastatin (CRESTOR) 20 MG tablet Take 1 tablet (20 mg total) by mouth daily.   No facility-administered encounter medications on file as of 06/08/2020.    Allergies (verified) Patient has no known allergies.   History: Past Medical History:  Diagnosis Date  . CAD (coronary artery disease)    a. remote stents/angioplasty. b. s/p CABG 01/2017 with mitral valve annuloplasty.  . Esophageal reflux   . GERD (gastroesophageal reflux disease)   . Hypothyroidism   . Immune thrombocytopenic purpura ([place])   . Lupus erythematosus   . Mitral valve insufficiency and aortic valve insufficiency   . Myocardial infarction ([place])    x 2  . PONV (postoperative nausea and vomiting)   . Postoperative atrial fibrillation ([place])   . Severe mitral regurgitation    a. s/p MV annuloplasty 01/2017 at time of CABG.  . Status post mitral valve annuloplasty   . Unspecified disease of pericardium    Past Surgical History:  Procedure Laterality Date  . CHOLECYSTECTOMY, LAPAROSCOPIC  2010  . CORONARY ARTERY BYPASS GRAFT N/A 01/30/2017   Procedure: CORONARY ARTERY BYPASS GRAFTING (CABG), Times four  using the right saphaneous vein, harvested endoscopicly.  and left internal mammary artery .;  Surgeon: Gaye Pollack, MD;  Location: [facility];  Service: Open Heart Surgery;  Laterality: N/A;  .  LEFT HEART CATH AND CORONARY ANGIOGRAPHY N/A 01/23/2017   Procedure: LEFT HEART CATH AND CORONARY ANGIOGRAPHY;  Surgeon: Belva Crome, MD;  Location: [place] [facility];  Service: Cardiovascular;  Laterality: N/A;  . MITRAL VALVE REPAIR N/A 01/30/2017   Procedure: MITRAL VALVE REPAIR (MVR);  Surgeon: Gaye Pollack, MD;  Location: [facility];  Service: Open Heart Surgery;  Laterality: N/A;  . plastic surgical repair      dog bite to leg  . SPLENECTOMY  5-04  . TEE WITHOUT CARDIOVERSION N/A 01/27/2017   Procedure: TRANSESOPHAGEAL  ECHOCARDIOGRAM (TEE);  Surgeon: Sanda Klein, MD;  Location: [facility];  Service: Cardiovascular;  Laterality: N/A;  . TEE WITHOUT CARDIOVERSION N/A 01/30/2017   Procedure: TRANSESOPHAGEAL ECHOCARDIOGRAM (TEE);  Surgeon: Gaye Pollack, MD;  Location: [facility];  Service: Open Heart Surgery;  Laterality: N/A;  . ULTRASOUND GUIDANCE FOR VASCULAR ACCESS  01/23/2017   Procedure: Ultrasound Guidance For Vascular Access;  Surgeon: Belva Crome, MD;  Location: [place] [facility];  Service: Cardiovascular;;   Family History  Problem Relation Age of Onset  . Cancer Mother        breast and cervical  . Paget's disease of bone Mother   . Fibromyalgia Mother   . Breast cancer Mother   . Hypertension Father   . Heart disease Father        PVD  . GER disease Father   . Cancer Paternal Grandfather        colon  . Heart attack Maternal Grandmother    Social History   Socioeconomic History  . Marital status: Single    Spouse name: Not on file  . Number of children: Not on file  . Years of education: Not on file  . Highest education level: Not on file  Occupational History  . Not on file  Tobacco Use  . Smoking status: Former Smoker    Packs/day: 0.50    Years: 30.00    Pack years: 15.00    Types: Cigarettes    Quit date: 12/23/2011    Years since quitting: 8.4  . Smokeless tobacco: Never Used  Vaping Use  . Vaping Use: Former  Substance and Sexual Activity  . Alcohol use: No  . Drug use: No  . Sexual activity: Not Currently    Comment: 1st intercourse 65 yo-More than 5 partners  Other Topics Concern  . Not on file  Social History Narrative   HSG,  graduated from [employer] in [place]. In college [name] -grad '13 with relgious studies. [employer] - Fall '13 for MA-divinity. Marrried '73 - 2 years/ divorced. 2 son- '73, '75 - CP.    Occupation: full-time Ship broker. She lives alone with her younger son living with her part-time but he resides in a managed care facility.     Social Determinants of Health   Financial Resource Strain: Low Risk   . Difficulty of Paying Living Expenses: Not hard at all  Food Insecurity: No Food Insecurity  . Worried About Charity fundraiser in the Last Year: Never true  . Ran Out of Food in the Last Year: Never true  Transportation Needs: No Transportation Needs  . Lack of Transportation (Medical): No  . Lack of Transportation (Non-Medical): No  Physical Activity: Not on file  Stress: No Stress Concern Present  . Feeling of Stress : Not at all  Social Connections: Moderately Integrated  . Frequency of Communication with Friends and Family: More than three  times a week  . Frequency of Social Gatherings with Friends and Family: More than three times a week  . Attends Religious Services: More than 4 times per year  . Active Member of Clubs or Organizations: Yes  . Attends Archivist Meetings: More than 4 times per year  . Marital Status: Divorced    Tobacco Counseling Counseling given: Not Answered   Clinical Intake:  Pre-visit preparation completed: Yes  Pain : No/denies pain Pain Score: 0-No pain     BMI - recorded: 40.72 Nutritional Status: BMI > 30  Obese Nutritional Risks: None Diabetes: No  How often do you need to have someone help you when you read instructions, pamphlets, or other written materials from your doctor or pharmacy?: 1 - Never What is the last grade level you completed in school?: Graduate Degree; Master of Divinity  Diabetic? no  Interpreter Needed?: No  Information entered by :: Katheren Jimmerson N. Hommer Cunliffe, LPN   Activities of Daily Living In your present state of health, do you have any difficulty performing the following activities: 06/08/2020  Hearing? N  Vision? N  Difficulty concentrating or making decisions? N  Walking or climbing stairs? N  Dressing or bathing? N  Doing errands, shopping? N  Preparing Food and eating ? N  Using the Toilet? N  In the past six months,  have you accidently leaked urine? N  Do you have problems with loss of bowel control? N  Managing your Medications? N  Managing your Finances? N  Housekeeping or managing your Housekeeping? N  Some recent data might be hidden    Patient Care Team: Janith Lima, MD as PCP - General (Internal Medicine) Belva Crome, MD as PCP - Cardiology (Cardiology) Edrick Oh, MD (Nephrology) Gavin Pound, MD (Rheumatology) Gerda Diss, DO as Referring Physician (Sports Medicine)  Indicate any recent Medical Services you may have received from other than [facility] providers in the past year (date may be approximate).     Assessment:   This is a routine wellness examination for Jahnya.  Hearing/Vision screen No exam data present  Dietary issues and exercise activities discussed: Current Exercise Habits: The patient has a physically strenuous job, but has no regular exercise apart from work., Exercise limited by: respiratory conditions(s);cardiac condition(s);orthopedic condition(s)  Goals   None    Depression Screen PHQ 2/9 Scores 06/08/2020 11/14/2019 10/25/2018 05/04/2017 02/24/2017  PHQ - 2 Score 0 0 0 0 -  Exception Documentation - - - - Medical reason  Not completed - - - - Recent bypass surgery.     Fall Risk Fall Risk  06/08/2020 11/14/2019 10/25/2018  Falls in the past year? 0 0 0  Number falls in past yr: 0 0 0  Injury with Fall? 0 0 0  Risk for fall due to : No Fall Risks - -  Follow up Falls evaluation completed Falls evaluation completed Falls evaluation completed    [place]:  Any stairs in or around the home? Yes  If so, are there any without handrails? No  Home free of loose throw rugs in walkways, pet beds, electrical cords, etc? Yes  Adequate lighting in your home to reduce risk of falls? Yes   ASSISTIVE DEVICES UTILIZED TO PREVENT FALLS:  Life alert? No  Use of a cane, walker or w/c? No  Grab bars in the bathroom? No  Shower  chair or bench in shower? No  Elevated toilet seat or a handicapped  toilet? No   TIMED UP AND GO:  Was the test performed? No .  Length of time to ambulate 10 feet: 0 sec.   Gait steady and fast without use of assistive device  Cognitive Function: Normal cognitive status assessed by direct observation by this Nurse Health Advisor. No abnormalities found.          Immunizations Immunization History  Administered Date(s) Administered  . Influenza Whole 01/21/2008, 01/16/2009, 11/21/2013  . Influenza, Seasonal, Injecte, Preservative Fre 01/14/2017  . Influenza,inj,Quad PF,6+ Mos 12/09/2018  . Influenza-Unspecified 12/12/2014, 12/25/2015, 01/07/2018  . Moderna Sars-Covid-2 Vaccination 03/31/2019, 04/29/2019, 11/04/2019, 03/31/2020  . PPD Test 09/13/2012, 09/15/2012, 04/15/2017, 03/30/2020  . Pneumococcal Polysaccharide-23 01/16/2009, 02/23/2014, 10/25/2018  . Tdap 05/22/2011    TDAP status: Up to date  Flu Vaccine status: Up to date  Pneumococcal vaccine status: Due, Education has been provided regarding the importance of this vaccine. Advised may receive this vaccine at local pharmacy or Health Dept. Aware to provide a copy of the vaccination record if obtained from local pharmacy or Health Dept. Verbalized acceptance and understanding.  Covid-19 vaccine status: Completed vaccines  Qualifies for Shingles Vaccine? Yes   Zostavax completed No   Shingrix Completed?: No.    Education has been provided regarding the importance of this vaccine. Patient has been advised to call insurance company to determine out of pocket expense if they have not yet received this vaccine. Advised may also receive vaccine at local pharmacy or Health Dept. Verbalized acceptance and understanding.  Screening Tests Health Maintenance  Topic Date Due  . INFLUENZA VACCINE  10/23/2019  . PAP SMEAR-Modifier  06/16/2020  . COLONOSCOPY (Pts 45-66yrs Insurance coverage will need to be confirmed)  09/19/2020   . TETANUS/TDAP  05/21/2021  . MAMMOGRAM  09/01/2021  . COVID-19 Vaccine  Completed  . Hepatitis C Screening  Completed  . HIV Screening  Completed  . HPV VACCINES  Aged Out    Health Maintenance  Health Maintenance Due  Topic Date Due  . INFLUENZA VACCINE  10/23/2019  . PAP SMEAR-Modifier  06/16/2020    Colorectal cancer screening: Type of screening: Colonoscopy. Completed 09/20/2010. Repeat every 10 years  Mammogram status: Completed 09/02/2019. Repeat every year  Bone Density status: Completed 10/19/2017. Results reflect: Bone density results: NORMAL. Repeat every 5 years.  Lung Cancer Screening: (Low Dose CT Chest recommended if Age 88-80 years, 30 pack-year currently smoking OR have quit w/in 15years.) does qualify.   Lung Cancer Screening Referral: no  Additional Screening:  Hepatitis C Screening: does qualify; Completed yes  Vision Screening: Recommended annual ophthalmology exams for early detection of glaucoma and other disorders of the eye. Is the patient up to date with their annual eye exam?  Yes  Who is the provider or what is the name of the office in which the patient attends annual eye exams? Camillo Flaming, MD. If pt is not established with a provider, would they like to be referred to a provider to establish care? No .   Dental Screening: Recommended annual dental exams for proper oral hygiene  Community Resource Referral / Chronic Care Management: CRR required this visit?  No   CCM required this visit?  No      Plan:     I have personally reviewed and noted the following in the patient's chart:   . Medical and social history . Use of alcohol, tobacco or illicit drugs  . Current medications and supplements . Functional ability and status . Nutritional status .  Physical activity . Advanced directives . List of other physicians . Hospitalizations, surgeries, and ER visits in previous 12 months . Vitals . Screenings to include cognitive,  depression, and falls . Referrals and appointments  In addition, I have reviewed and discussed with patient certain preventive protocols, quality metrics, and best practice recommendations. A written personalized care plan for preventive services as well as general preventive health recommendations were provided to patient.     Sheral Flow, LPN   [id number]   Nurse Notes:  Medications reviewed with patient; no opioid use noted.

## 2020-06-08 NOTE — Patient Instructions (Signed)
[name] , Thank you for taking time to come for your Medicare Wellness Visit. I appreciate your ongoing commitment to your health goals. Please review the following plan we discussed and let me know if I can assist you in the future.   Screening recommendations/referrals: Colonoscopy: 09/20/2010; due every 10 years (scheduled for 06/15/2020) Mammogram: 09/02/2019; due every 1-2 years Bone Density: 10/19/2017; due every 5 years Recommended yearly ophthalmology/optometry visit for glaucoma screening and checkup Recommended yearly dental visit for hygiene and checkup  Vaccinations: Influenza vaccine: 10/2019 per patient Pneumococcal vaccine: 10/25/2018 Tdap vaccine: 05/22/2011; due every 10 years Shingles vaccine: never done  Covid-19: 03/31/2019, 04/29/2019, 11/04/2019, 03/31/2020  Advanced directives: Advance directive discussed with you today. Even though you declined this today please call our office should you change your mind and we can give you the proper paperwork for you to fill out.  Conditions/risks identified: Yes; Reviewed health maintenance screenings with patient today and relevant education, vaccines, and/or referrals were provided. Please continue to do your personal lifestyle choices by: daily care of teeth and gums, regular physical activity (goal should be 5 days a week for 30 minutes), eat a healthy diet, avoid tobacco and drug use, limiting any alcohol intake, taking a low-dose aspirin (if not allergic or have been advised by your provider otherwise) and taking vitamins and minerals as recommended by your provider. Continue doing brain stimulating activities (puzzles, reading, adult coloring books, staying active) to keep memory sharp. Continue to eat heart healthy diet (full of fruits, vegetables, whole grains, lean protein, water--limit salt, fat, and sugar intake) and increase physical activity as tolerated.  Next appointment: Please schedule your next Medicare Wellness Visit with your  Nurse Health Advisor in 1 year by calling [phone].   Preventive Care 40-64 Years, Female Preventive care refers to lifestyle choices and visits with your health care provider that can promote health and wellness. What does preventive care include?  A yearly physical exam. This is also called an annual well check.  Dental exams once or twice a year.  Routine eye exams. Ask your health care provider how often you should have your eyes checked.  Personal lifestyle choices, including:  Daily care of your teeth and gums.  Regular physical activity.  Eating a healthy diet.  Avoiding tobacco and drug use.  Limiting alcohol use.  Practicing safe sex.  Taking low-dose aspirin daily starting at age 18.  Taking vitamin and mineral supplements as recommended by your health care provider. What happens during an annual well check? The services and screenings done by your health care provider during your annual well check will depend on your age, overall health, lifestyle risk factors, and family history of disease. Counseling  Your health care provider may ask you questions about your:  Alcohol use.  Tobacco use.  Drug use.  Emotional well-being.  Home and relationship well-being.  Sexual activity.  Eating habits.  Work and work Statistician.  Method of birth control.  Menstrual cycle.  Pregnancy history. Screening  You may have the following tests or measurements:  Height, weight, and BMI.  Blood pressure.  Lipid and cholesterol levels. These may be checked every 5 years, or more frequently if you are over 57 years old.  Skin check.  Lung cancer screening. You may have this screening every year starting at age 49 if you have a 30-pack-year history of smoking and currently smoke or have quit within the past 15 years.  Fecal occult blood test (FOBT) of the stool.  You may have this test every year starting at age 27.  Flexible sigmoidoscopy or colonoscopy.  You may have a sigmoidoscopy every 5 years or a colonoscopy every 10 years starting at age 52.  Hepatitis C blood test.  Hepatitis B blood test.  Sexually transmitted disease (STD) testing.  Diabetes screening. This is done by checking your blood sugar (glucose) after you have not eaten for a while (fasting). You may have this done every 1-3 years.  Mammogram. This may be done every 1-2 years. Talk to your health care provider about when you should start having regular mammograms. This may depend on whether you have a family history of breast cancer.  BRCA-related cancer screening. This may be done if you have a family history of breast, ovarian, tubal, or peritoneal cancers.  Pelvic exam and Pap test. This may be done every 3 years starting at age 52. Starting at age 9, this may be done every 5 years if you have a Pap test in combination with an HPV test.  Bone density scan. This is done to screen for osteoporosis. You may have this scan if you are at high risk for osteoporosis. Discuss your test results, treatment options, and if necessary, the need for more tests with your health care provider. Vaccines  Your health care provider may recommend certain vaccines, such as:  Influenza vaccine. This is recommended every year.  Tetanus, diphtheria, and acellular pertussis (Tdap, Td) vaccine. You may need a Td booster every 10 years.  Zoster vaccine. You may need this after age 35.  Pneumococcal 13-valent conjugate (PCV13) vaccine. You may need this if you have certain conditions and were not previously vaccinated.  Pneumococcal polysaccharide (PPSV23) vaccine. You may need one or two doses if you smoke cigarettes or if you have certain conditions. Talk to your health care provider about which screenings and vaccines you need and how often you need them. This information is not intended to replace advice given to you by your health care provider. Make sure you discuss any questions you  have with your health care provider. Document Released: 04/06/2015 Document Revised: 11/28/2015 Document Reviewed: 01/09/2015 Elsevier Interactive Patient Education  2017 [place] Prevention in the Home Falls can cause injuries. They can happen to people of all ages. There are many things you can do to make your home safe and to help prevent falls. What can I do on the outside of my home?  Regularly fix the edges of walkways and driveways and fix any cracks.  Remove anything that might make you trip as you walk through a door, such as a raised step or threshold.  Trim any bushes or trees on the path to your home.  Use bright outdoor lighting.  Clear any walking paths of anything that might make someone trip, such as rocks or tools.  Regularly check to see if handrails are loose or broken. Make sure that both sides of any steps have handrails.  Any raised decks and porches should have guardrails on the edges.  Have any leaves, snow, or ice cleared regularly.  Use sand or salt on walking paths during winter.  Clean up any spills in your garage right away. This includes oil or grease spills. What can I do in the bathroom?  Use night lights.  Install grab bars by the toilet and in the tub and shower. Do not use towel bars as grab bars.  Use non-skid mats or decals in the tub  or shower.  If you need to sit down in the shower, use a plastic, non-slip stool.  Keep the floor dry. Clean up any water that spills on the floor as soon as it happens.  Remove soap buildup in the tub or shower regularly.  Attach bath mats securely with double-sided non-slip rug tape.  Do not have throw rugs and other things on the floor that can make you trip. What can I do in the bedroom?  Use night lights.  Make sure that you have a light by your bed that is easy to reach.  Do not use any sheets or blankets that are too big for your bed. They should not hang down onto the  floor.  Have a firm chair that has side arms. You can use this for support while you get dressed.  Do not have throw rugs and other things on the floor that can make you trip. What can I do in the kitchen?  Clean up any spills right away.  Avoid walking on wet floors.  Keep items that you use a lot in easy-to-reach places.  If you need to reach something above you, use a strong step stool that has a grab bar.  Keep electrical cords out of the way.  Do not use floor polish or wax that makes floors slippery. If you must use wax, use non-skid floor wax.  Do not have throw rugs and other things on the floor that can make you trip. What can I do with my stairs?  Do not leave any items on the stairs.  Make sure that there are handrails on both sides of the stairs and use them. Fix handrails that are broken or loose. Make sure that handrails are as long as the stairways.  Check any carpeting to make sure that it is firmly attached to the stairs. Fix any carpet that is loose or worn.  Avoid having throw rugs at the top or bottom of the stairs. If you do have throw rugs, attach them to the floor with carpet tape.  Make sure that you have a light switch at the top of the stairs and the bottom of the stairs. If you do not have them, ask someone to add them for you. What else can I do to help prevent falls?  Wear shoes that:  Do not have high heels.  Have rubber bottoms.  Are comfortable and fit you well.  Are closed at the toe. Do not wear sandals.  If you use a stepladder:  Make sure that it is fully opened. Do not climb a closed stepladder.  Make sure that both sides of the stepladder are locked into place.  Ask someone to hold it for you, if possible.  Clearly mark and make sure that you can see:  Any grab bars or handrails.  First and last steps.  Where the edge of each step is.  Use tools that help you move around (mobility aids) if they are needed. These  include:  Canes.  Walkers.  Scooters.  Crutches.  Turn on the lights when you go into a dark area. Replace any light bulbs as soon as they burn out.  Set up your furniture so you have a clear path. Avoid moving your furniture around.  If any of your floors are uneven, fix them.  If there are any pets around you, be aware of where they are.  Review your medicines with your doctor. Some medicines can make you  feel dizzy. This can increase your chance of falling. Ask your doctor what other things that you can do to help prevent falls. This information is not intended to replace advice given to you by your health care provider. Make sure you discuss any questions you have with your health care provider. Document Released: 01/04/2009 Document Revised: 08/16/2015 Document Reviewed: 04/14/2014 Elsevier Interactive Patient Education  2017 [employer].

## 2020-06-11 ENCOUNTER — Other Ambulatory Visit: Payer: Self-pay | Admitting: Internal Medicine

## 2020-06-11 ENCOUNTER — Telehealth: Payer: Self-pay | Admitting: Internal Medicine

## 2020-06-11 DIAGNOSIS — E039 Hypothyroidism, unspecified: Secondary | ICD-10-CM

## 2020-06-11 MED ORDER — LEVOTHYROXINE SODIUM 75 MCG PO TABS: 75.0000 ug | ORAL_TABLET | Freq: Every day | ORAL | 0 refills | Status: DC

## 2020-06-11 NOTE — Telephone Encounter (Signed)
1.Medication Requested: levothyroxine (SYNTHROID) 75 MCG tablet    2. Pharmacy (Name, Street, [place]): [id number] - [place], [place] - [address]  3. On Med List: yes   4. Last Visit with PCP: 8.23.21  5. Next visit date with PCP: n/a   Patient said she is out of medication. Please advise    Agent: Please be advised that RX refills may take up to 3 business days. We ask that you follow-up with your pharmacy.

## 2020-06-11 NOTE — Telephone Encounter (Signed)
She needs to be seen every 6 months for a TSH check

## 2020-06-11 NOTE — Telephone Encounter (Addendum)
Pt stated that she has been out of medication for 3 weeks and going out of town tomorrow morning to [place]. She also stated that she was just seen on Friday by the health coach. She was informed that lab work was needed but would like for me to check with PCP.

## 2020-06-15 ENCOUNTER — Ambulatory Visit (HOSPITAL_COMMUNITY)
Admission: RE | Admit: 2020-06-15 | Payer: BC Managed Care – PPO | Source: Home / Self Care | Admitting: Gastroenterology

## 2020-06-15 SURGERY — COLONOSCOPY WITH PROPOFOL
Anesthesia: Monitor Anesthesia Care

## 2020-07-03 ENCOUNTER — Other Ambulatory Visit: Payer: Self-pay | Admitting: Internal Medicine

## 2020-07-03 DIAGNOSIS — E039 Hypothyroidism, unspecified: Secondary | ICD-10-CM

## 2020-07-12 DIAGNOSIS — Z79899 Other long term (current) drug therapy: Secondary | ICD-10-CM | POA: Diagnosis not present

## 2020-07-12 DIAGNOSIS — H16223 Keratoconjunctivitis sicca, not specified as Sjogren's, bilateral: Secondary | ICD-10-CM | POA: Diagnosis not present

## 2020-07-16 ENCOUNTER — Telehealth: Payer: Self-pay | Admitting: Physical Medicine and Rehabilitation

## 2020-07-16 NOTE — Telephone Encounter (Signed)
Patient called requesting a call back to set appt to be seen for her back. Please call patient at [phone].

## 2020-07-17 NOTE — Telephone Encounter (Signed)
Pt had Bil L4-L5, L3 L4 MBB done on 05/31/20 was told to called back if pain returned. Please advise if this an IL or MBB? Okay to sch?

## 2020-07-17 NOTE — Telephone Encounter (Signed)
Called and sch pt 5/9

## 2020-07-24 DIAGNOSIS — I2510 Atherosclerotic heart disease of native coronary artery without angina pectoris: Secondary | ICD-10-CM | POA: Diagnosis not present

## 2020-07-24 DIAGNOSIS — E039 Hypothyroidism, unspecified: Secondary | ICD-10-CM | POA: Diagnosis not present

## 2020-07-24 DIAGNOSIS — R809 Proteinuria, unspecified: Secondary | ICD-10-CM | POA: Diagnosis not present

## 2020-07-24 DIAGNOSIS — I25810 Atherosclerosis of coronary artery bypass graft(s) without angina pectoris: Secondary | ICD-10-CM | POA: Diagnosis not present

## 2020-07-24 DIAGNOSIS — I4891 Unspecified atrial fibrillation: Secondary | ICD-10-CM | POA: Diagnosis not present

## 2020-07-24 DIAGNOSIS — M329 Systemic lupus erythematosus, unspecified: Secondary | ICD-10-CM | POA: Diagnosis not present

## 2020-07-24 DIAGNOSIS — N055 Unspecified nephritic syndrome with diffuse mesangiocapillary glomerulonephritis: Secondary | ICD-10-CM | POA: Diagnosis not present

## 2020-07-24 DIAGNOSIS — D631 Anemia in chronic kidney disease: Secondary | ICD-10-CM | POA: Diagnosis not present

## 2020-07-24 DIAGNOSIS — N181 Chronic kidney disease, stage 1: Secondary | ICD-10-CM | POA: Diagnosis not present

## 2020-07-24 LAB — COMPREHENSIVE METABOLIC PANEL
Albumin: 3.8 (ref 3.5–5.0)
Calcium: 9 (ref 8.7–10.7)

## 2020-07-24 LAB — BASIC METABOLIC PANEL
BUN: 15 (ref 4–21)
CO2: 23 — AB (ref 13–22)
Chloride: 105 (ref 99–108)
Creatinine: 1.3 — AB (ref 0.5–1.1)
Glucose: 80
Potassium: 4.7 (ref 3.4–5.3)
Sodium: 138 (ref 137–147)

## 2020-07-25 ENCOUNTER — Other Ambulatory Visit: Payer: Self-pay | Admitting: Nephrology

## 2020-07-30 ENCOUNTER — Ambulatory Visit (INDEPENDENT_AMBULATORY_CARE_PROVIDER_SITE_OTHER): Payer: BC Managed Care – PPO | Admitting: Physical Medicine and Rehabilitation

## 2020-07-30 ENCOUNTER — Other Ambulatory Visit: Payer: Self-pay

## 2020-07-30 ENCOUNTER — Ambulatory Visit: Payer: Self-pay

## 2020-07-30 ENCOUNTER — Encounter: Payer: Self-pay | Admitting: Physical Medicine and Rehabilitation

## 2020-07-30 VITALS — BP 128/85 | HR 76

## 2020-07-30 DIAGNOSIS — M47816 Spondylosis without myelopathy or radiculopathy, lumbar region: Secondary | ICD-10-CM

## 2020-07-30 MED ORDER — BUPIVACAINE HCL 0.25 % IJ SOLN
2.0000 mL | Freq: Once | INTRAMUSCULAR | Status: AC
  Administered 2020-07-30: 2 mL

## 2020-07-30 NOTE — Procedures (Signed)
Lumbar Diagnostic Facet Joint Nerve Block with Fluoroscopic Guidance   Patient: [name]      Date of Birth: 1956-03-14 MRN: [id number] PCP: Janith Lima, MD      Visit Date: 07/30/2020   Universal Protocol:    Date/Time: 05/09/224:42 PM  Consent Given By: the patient  Position: PRONE  Additional Comments: Vital signs were monitored before and after the procedure. Patient was prepped and draped in the usual sterile fashion. The correct patient, procedure, and site was verified.   Injection Procedure Details:   Procedure diagnoses:  1. Spondylosis without myelopathy or radiculopathy, lumbar region      Meds Administered:  Meds ordered this encounter  Medications  . bupivacaine (MARCAINE) 0.25 % (with pres) injection 2 mL     Laterality: Bilateral  Location/Site: L4-L5, L3 and L4 medial branches  Needle: 5.0 in., 25 ga.  Short bevel or Quincke spinal needle  Needle Placement: Oblique pedical  Findings:   -Comments: There was excellent flow of contrast along the articular pillars without intravascular flow.  Procedure Details: The fluoroscope beam is vertically oriented in AP and then obliqued 15 to 20 degrees to the ipsilateral side of the desired nerve to achieve the "Scotty dog" appearance.  The skin over the target area of the junction of the superior articulating process and the transverse process (sacral ala if blocking the L5 dorsal rami) was locally anesthetized with a 1 ml volume of 1% Lidocaine without Epinephrine.  The spinal needle was inserted and advanced in a trajectory view down to the target.   After contact with periosteum and negative aspirate for blood and CSF, correct placement without intravascular or epidural spread was confirmed by injecting 0.5 ml. of Isovue-250.  A spot radiograph was obtained of this image.    Next, a 0.5 ml. volume of the injectate described above was injected. The needle was then redirected to the other facet  joint nerves mentioned above if needed.  Prior to the procedure, the patient was given a Pain Diary which was completed for baseline measurements.  After the procedure, the patient rated their pain every 30 minutes and will continue rating at this frequency for a total of 5 hours.  The patient has been asked to complete the Diary and return to [name] by mail, fax or hand delivered as soon as possible.   Additional Comments:  The patient tolerated the procedure well Dressing: 2 x 2 sterile gauze and Band-Aid    Post-procedure details: Patient was observed during the procedure. Post-procedure instructions were reviewed.  Patient left the clinic in stable condition.

## 2020-07-30 NOTE — Progress Notes (Signed)
[name] - 65 y.o. female MRN [id number]  Date of birth: [birth year]  Office Visit Note: Visit Date: 07/30/2020 PCP: Janith Lima, MD Referred by: Janith Lima, MD  Subjective: Chief Complaint  Patient presents with  . Lower Back - Pain   HPI:  [name] is a 65 y.o. female who comes in today for planned repeat Bilateral L4-L5 Lumbar facet/medial branch block with fluoroscopic guidance.  The patient has failed conservative care including home exercise, medications, time and activity modification.  This injection will be diagnostic and hopefully therapeutic.  Please see requesting physician notes for further details and justification.  Exam shows concordant low back pain with facet joint loading and extension. Patient received more than 80% pain relief from prior injection. This would be the second block in a diagnostic double block paradigm.     Referring: Dr. Teresa Coombs  ROS Otherwise per HPI.  Assessment & Plan: Visit Diagnoses:    ICD-10-CM   1. Spondylosis without myelopathy or radiculopathy, lumbar region  M47.816 XR C-ARM NO REPORT    Facet Injection    bupivacaine (MARCAINE) 0.25 % (with pres) injection 2 mL    Plan: No additional findings.   Meds & Orders:  Meds ordered this encounter  Medications  . bupivacaine (MARCAINE) 0.25 % (with pres) injection 2 mL    Orders Placed This Encounter  Procedures  . Facet Injection  . XR C-ARM NO REPORT    Follow-up: Return for Review Pain Diary.   Procedures: No procedures performed  Lumbar Diagnostic Facet Joint Nerve Block with Fluoroscopic Guidance   Patient: [name]      Date of Birth: January 20, 1956 MRN: [id number] PCP: Janith Lima, MD      Visit Date: 07/30/2020   Universal Protocol:    Date/Time: 05/09/224:42 PM  Consent Given By: the patient  Position: PRONE  Additional Comments: Vital signs were monitored before and after the procedure. Patient was prepped and  draped in the usual sterile fashion. The correct patient, procedure, and site was verified.   Injection Procedure Details:   Procedure diagnoses:  1. Spondylosis without myelopathy or radiculopathy, lumbar region      Meds Administered:  Meds ordered this encounter  Medications  . bupivacaine (MARCAINE) 0.25 % (with pres) injection 2 mL     Laterality: Bilateral  Location/Site: L4-L5, L3 and L4 medial branches  Needle: 5.0 in., 25 ga.  Short bevel or Quincke spinal needle  Needle Placement: Oblique pedical  Findings:   -Comments: There was excellent flow of contrast along the articular pillars without intravascular flow.  Procedure Details: The fluoroscope beam is vertically oriented in AP and then obliqued 15 to 20 degrees to the ipsilateral side of the desired nerve to achieve the "Scotty dog" appearance.  The skin over the target area of the junction of the superior articulating process and the transverse process (sacral ala if blocking the L5 dorsal rami) was locally anesthetized with a 1 ml volume of 1% Lidocaine without Epinephrine.  The spinal needle was inserted and advanced in a trajectory view down to the target.   After contact with periosteum and negative aspirate for blood and CSF, correct placement without intravascular or epidural spread was confirmed by injecting 0.5 ml. of Isovue-250.  A spot radiograph was obtained of this image.    Next, a 0.5 ml. volume of the injectate described above was injected. The needle was then redirected to the other facet joint nerves mentioned  above if needed.  Prior to the procedure, the patient was given a Pain Diary which was completed for baseline measurements.  After the procedure, the patient rated their pain every 30 minutes and will continue rating at this frequency for a total of 5 hours.  The patient has been asked to complete the Diary and return to [name] by mail, fax or hand delivered as soon as possible.   Additional  Comments:  The patient tolerated the procedure well Dressing: 2 x 2 sterile gauze and Band-Aid    Post-procedure details: Patient was observed during the procedure. Post-procedure instructions were reviewed.  Patient left the clinic in stable condition.    Clinical History: Low back pain with right leg pain  EXAM: MRI LUMBAR SPINE WITHOUT CONTRAST  TECHNIQUE: Multiplanar, multisequence MR imaging of the lumbar spine was performed. No intravenous contrast was administered.  COMPARISON:  Lumbar MRI 08/31/2016  FINDINGS: Segmentation:  Normal  Alignment:  Mild retrolisthesis L1-2, L2-3, L3-4.  Vertebrae: Negative for fracture or mass. Discogenic changes on the left at L5-S1 have progressed in the interval.  Conus medullaris and cauda equina: Conus extends to the L1-2 level. Conus and cauda equina appear normal.  Paraspinal and other soft tissues: Negative for paraspinous mass, adenopathy, or fluid collection.  Disc levels:  L1-2: Mild degenerative change.  Negative for stenosis  L2-3: Mild facet degeneration.  Negative for stenosis  L3-4: Mild to moderate facet degeneration. Mild disc bulging. No significant stenosis.  L4-5: Severe facet degeneration bilaterally with facet joint overgrowth and bilateral facet joint effusions. This is similar to the prior study. There is diffuse bulging of the disc. Mild spinal stenosis and moderate subarticular stenosis similar to the prior study.  L5-S1: Progressive disc degeneration and disc space narrowing. Sclerotic endplate changes and spurring on the left have progressed since the prior study. There is bilateral facet hypertrophy. There is moderate subarticular stenosis on the left which has progressed in the interval. Left paracentral disc protrusion shows significant interval improvement. There is improved mass effect on left S1 nerve root compared with the prior study.  IMPRESSION: Mild spinal  stenosis and moderate subarticular stenosis bilaterally L4-5 unchanged. Severe facet degeneration L4-5  Improvement in left-sided disc protrusion L5-S1 with less mass effect on the S1 nerve root. There is progressive disc degeneration and spurring on the left at L5-S1 with progression of subarticular stenosis on the left due to spurring.   Electronically Signed   By: Franchot Gallo M.D.   On: 10/13/2019 15:05     Objective:  VS:  HT:    WT:   BMI:     BP:128/85  HR:76bpm  TEMP: ( )  RESP:  Physical Exam Vitals and nursing note reviewed.  Constitutional:      General: She is not in acute distress.    Appearance: Normal appearance. She is obese. She is not ill-appearing.  HENT:     Head: Normocephalic and atraumatic.     Right Ear: External ear normal.     Left Ear: External ear normal.  Eyes:     Extraocular Movements: Extraocular movements intact.  Cardiovascular:     Rate and Rhythm: Normal rate.     Pulses: Normal pulses.  Pulmonary:     Effort: Pulmonary effort is normal. No respiratory distress.  Abdominal:     General: There is no distension.     Palpations: Abdomen is soft.  Musculoskeletal:        General: Tenderness present.  Cervical back: Neck supple.     Right lower leg: No edema.     Left lower leg: No edema.     Comments: Patient has good distal strength with no pain over the greater trochanters.  No clonus or focal weakness. Patient somewhat slow to rise from a seated position to full extension.  There is concordant low back pain with facet loading and lumbar spine extension rotation.  There are no definitive trigger points but the patient is somewhat tender across the lower back and PSIS.  There is no pain with hip rotation.   Skin:    Findings: No erythema, lesion or rash.  Neurological:     General: No focal deficit present.     Mental Status: She is alert and oriented to person, place, and time.     Sensory: No sensory deficit.     Motor:  No weakness or abnormal muscle tone.     Coordination: Coordination normal.  Psychiatric:        Mood and Affect: Mood normal.        Behavior: Behavior normal.      Imaging: XR C-ARM NO REPORT  Result Date: 07/30/2020 Please see Notes tab for imaging impression.

## 2020-07-30 NOTE — Progress Notes (Signed)
Pt state lower back pain. Pt state walking, standing and sitting makes the pain worse. Pt state she take over the counter pain meds to help ease her pain. Pt has hx of inj on 05/31/20 pt state it help for a few weeks.  Numeric Pain Rating Scale and Functional Assessment Average Pain 8   In the last MONTH (on 0-10 scale) has pain interfered with the following?  1. General activity like being  able to carry out your everyday physical activities such as walking, climbing stairs, carrying groceries, or moving a chair?  Rating(8)   +Driver, -BT, -Dye Allergies.

## 2020-07-30 NOTE — Patient Instructions (Signed)

## 2020-07-31 ENCOUNTER — Other Ambulatory Visit: Payer: Self-pay | Admitting: Nephrology

## 2020-07-31 DIAGNOSIS — N181 Chronic kidney disease, stage 1: Secondary | ICD-10-CM

## 2020-08-08 ENCOUNTER — Other Ambulatory Visit: Payer: Self-pay

## 2020-08-08 ENCOUNTER — Encounter: Payer: Self-pay | Admitting: Internal Medicine

## 2020-08-08 ENCOUNTER — Ambulatory Visit (INDEPENDENT_AMBULATORY_CARE_PROVIDER_SITE_OTHER): Payer: BC Managed Care – PPO | Admitting: Internal Medicine

## 2020-08-08 VITALS — BP 126/82 | HR 74 | Temp 98.1°F | Resp 16 | Ht 67.0 in | Wt 270.0 lb

## 2020-08-08 DIAGNOSIS — E039 Hypothyroidism, unspecified: Secondary | ICD-10-CM

## 2020-08-08 DIAGNOSIS — R600 Localized edema: Secondary | ICD-10-CM | POA: Insufficient documentation

## 2020-08-08 DIAGNOSIS — I10 Essential (primary) hypertension: Secondary | ICD-10-CM | POA: Diagnosis not present

## 2020-08-08 LAB — D-DIMER, QUANTITATIVE: D-Dimer, Quant: 0.67 mcg/mL FEU — ABNORMAL HIGH (ref <0.50)

## 2020-08-08 MED ORDER — INDAPAMIDE 1.25 MG PO TABS: 1.2500 mg | ORAL_TABLET | Freq: Every day | ORAL | 0 refills | Status: DC

## 2020-08-08 NOTE — Patient Instructions (Signed)
Hypothyroidism  Hypothyroidism is when the thyroid gland does not make enough of certain hormones (it is underactive). The thyroid gland is a small gland located in the lower front part of the neck, just in front of the windpipe (trachea). This gland makes hormones that help control how the body uses food for energy (metabolism) as well as how the heart and brain function. These hormones also play a role in keeping your bones strong. When the thyroid is underactive, it produces too little of the hormones thyroxine (T4) and triiodothyronine (T3). What are the causes? This condition may be caused by:  Hashimoto's disease. This is a disease in which the body's disease-fighting system (immune system) attacks the thyroid gland. This is the most common cause.  Viral infections.  Pregnancy.  Certain medicines.  Birth defects.  Past radiation treatments to the head or neck for cancer.  Past treatment with radioactive iodine.  Past exposure to radiation in the environment.  Past surgical removal of part or all of the thyroid.  Problems with a gland in the center of the brain (pituitary gland).  Lack of enough iodine in the diet. What increases the risk? You are more likely to develop this condition if:  You are female.  You have a family history of thyroid conditions.  You use a medicine called lithium.  You take medicines that affect the immune system (immunosuppressants). What are the signs or symptoms? Symptoms of this condition include:  Feeling as though you have no energy (lethargy).  Not being able to tolerate cold.  Weight gain that is not explained by a change in diet or exercise habits.  Lack of appetite.  Dry skin.  Coarse hair.  Menstrual irregularity.  Slowing of thought processes.  Constipation.  Sadness or depression. How is this diagnosed? This condition may be diagnosed based on:  Your symptoms, your medical history, and a physical exam.  Blood  tests. You may also have imaging tests, such as an ultrasound or MRI. How is this treated? This condition is treated with medicine that replaces the thyroid hormones that your body does not make. After you begin treatment, it may take several weeks for symptoms to go away. Follow these instructions at home:  Take over-the-counter and prescription medicines only as told by your health care provider.  If you start taking any new medicines, tell your health care provider.  Keep all follow-up visits as told by your health care provider. This is important. ? As your condition improves, your dosage of thyroid hormone medicine may change. ? You will need to have blood tests regularly so that your health care provider can monitor your condition. Contact a health care provider if:  Your symptoms do not get better with treatment.  You are taking thyroid hormone replacement medicine and you: ? Sweat a lot. ? Have tremors. ? Feel anxious. ? Lose weight rapidly. ? Cannot tolerate heat. ? Have emotional swings. ? Have diarrhea. ? Feel weak. Get help right away if you have:  Chest pain.  An irregular heartbeat.  A rapid heartbeat.  Difficulty breathing. Summary  Hypothyroidism is when the thyroid gland does not make enough of certain hormones (it is underactive).  When the thyroid is underactive, it produces too little of the hormones thyroxine (T4) and triiodothyronine (T3).  The most common cause is Hashimoto's disease, a disease in which the body's disease-fighting system (immune system) attacks the thyroid gland. The condition can also be caused by viral infections, medicine, pregnancy, or   past radiation treatment to the head or neck.  Symptoms may include weight gain, dry skin, constipation, feeling as though you do not have energy, and not being able to tolerate cold.  This condition is treated with medicine to replace the thyroid hormones that your body does not make. This  information is not intended to replace advice given to you by your health care provider. Make sure you discuss any questions you have with your health care provider. Document Revised: 12/09/2019 Document Reviewed: 11/24/2019 Elsevier Patient Education  2021 Elsevier Inc.  

## 2020-08-08 NOTE — Progress Notes (Signed)
Subjective:  Patient ID: [name], female    DOB: [birth year]  Age: 65 y.o. MRN: [id number]  CC: Hypothyroidism  This visit occurred during the SARS-CoV-2 public health emergency.  Safety protocols were in place, including screening questions prior to the visit, additional usage of staff PPE, and extensive cleaning of exam room while observing appropriate contact time as indicated for disinfecting solutions.    HPI [name] presents for f/up -  She complains of weight gain and lower extremity edema.  She is active and denies episodes of chest pain, diaphoresis, dizziness, lightheadedness, or near syncope.  Outpatient Medications Prior to Visit  Medication Sig Dispense Refill  . azaTHIOprine (IMURAN) 50 MG tablet Take 50 mg by mouth 2 (two) times daily.    . cyclobenzaprine (FLEXERIL) 5 MG tablet Take 5 mg by mouth 3 (three) times daily as needed.    . hydroxychloroquine (PLAQUENIL) 200 MG tablet Take 200 mg by mouth daily.     . metoprolol succinate (TOPROL-XL) 50 MG 24 hr tablet Take 1 tablet (50 mg total) by mouth daily. 90 tablet 3  . pantoprazole (PROTONIX) 40 MG tablet TAKE 1 TABLET BY MOUTH EVERY DAY 90 tablet 3  . rosuvastatin (CRESTOR) 20 MG tablet Take 1 tablet (20 mg total) by mouth daily. 90 tablet 2  . amLODipine (NORVASC) 5 MG tablet TAKE 1 TABLET BY MOUTH EVERY DAY 90 tablet 3  . levothyroxine (SYNTHROID) 75 MCG tablet Take 1 tablet (75 mcg total) by mouth daily. 30 tablet 0   No facility-administered medications prior to visit.    ROS Review of Systems  Constitutional: Positive for unexpected weight change. Negative for diaphoresis and fatigue.  HENT: Negative.  Negative for trouble swallowing.   Eyes: Negative.   Respiratory: Negative for cough, chest tightness, shortness of breath and wheezing.   Cardiovascular: Positive for leg swelling. Negative for chest pain and palpitations.  Gastrointestinal: Negative for abdominal pain, constipation,  diarrhea, nausea and vomiting.  Endocrine: Negative for cold intolerance and heat intolerance.  Genitourinary: Negative.  Negative for difficulty urinating.  Musculoskeletal: Positive for arthralgias. Negative for myalgias.  Skin: Negative.  Negative for color change.  Neurological: Negative.  Negative for dizziness, weakness, light-headedness and headaches.  Hematological: Negative for adenopathy. Does not bruise/bleed easily.  Psychiatric/Behavioral: Negative.     Objective:  BP 126/82 (BP Location: Left Arm, Patient Position: Sitting, Cuff Size: Large)   Pulse 74   Temp 98.1 F (36.7 C) (Oral)   Resp 16   Ht 5\' 7"  (1.702 m)   Wt 270 lb (122.5 kg) Comment: delined  SpO2 97%   BMI 42.29 kg/m   BP Readings from Last 3 Encounters:  08/08/20 126/82  07/30/20 128/85  06/08/20 120/70    Wt Readings from Last 3 Encounters:  08/08/20 270 lb (122.5 kg)  06/08/20 260 lb (117.9 kg)  04/02/20 250 lb (113.4 kg)    Physical Exam Vitals reviewed.  Constitutional:      General: She is not in acute distress.    Appearance: She is obese. She is not toxic-appearing or diaphoretic.  HENT:     Nose: Nose normal.     Mouth/Throat:     Mouth: Mucous membranes are moist.  Eyes:     General: No scleral icterus.    Conjunctiva/sclera: Conjunctivae normal.  Cardiovascular:     Rate and Rhythm: Normal rate and regular rhythm.     Heart sounds: No murmur heard.     Comments: EKG-  NSR, 69 bpm Low voltage c/w body No LVH or Q waves Pulmonary:     Effort: Pulmonary effort is normal.     Breath sounds: No stridor. No wheezing, rhonchi or rales.  Abdominal:     General: Abdomen is protuberant. Bowel sounds are normal. There is no distension.     Palpations: Abdomen is soft. There is no hepatomegaly, splenomegaly or mass.  Musculoskeletal:        General: Normal range of motion.     Cervical back: Neck supple.     Right lower leg: 1+ Pitting Edema (tr pitting) present.     Left lower  leg: 1+ Pitting Edema (tr pitting) present.  Lymphadenopathy:     Cervical: No cervical adenopathy.  Skin:    General: Skin is warm and dry.  Neurological:     General: No focal deficit present.     Mental Status: She is alert. Mental status is at baseline.  Psychiatric:        Mood and Affect: Mood normal.     Lab Results  Component Value Date   WBC 3.6 (L) 08/08/2020   HGB 13.1 08/08/2020   HCT 38.9 08/08/2020   PLT 198.0 08/08/2020   GLUCOSE 88 08/04/2018   CHOL 143 11/14/2019   TRIG 55 11/14/2019   HDL 61 11/14/2019   LDLDIRECT 321.8 02/27/2012   LDLCALC 68 11/14/2019   ALT 14 08/08/2020   AST 21 08/08/2020   NA 138 07/24/2020   K 4.7 07/24/2020   CL 105 07/24/2020   CREATININE 1.3 (A) 07/24/2020   BUN 15 07/24/2020   CO2 23 (A) 07/24/2020   TSH 2.64 08/08/2020   INR 1.09 07/01/2017   HGBA1C 5.6 01/31/2017    MR LUMBAR SPINE WO CONTRAST  Result Date: 10/13/2019 CLINICAL DATA:  Low back pain with right leg pain EXAM: MRI LUMBAR SPINE WITHOUT CONTRAST TECHNIQUE: Multiplanar, multisequence MR imaging of the lumbar spine was performed. No intravenous contrast was administered. COMPARISON:  Lumbar MRI 08/31/2016 FINDINGS: Segmentation:  Normal Alignment:  Mild retrolisthesis L1-2, L2-3, L3-4. Vertebrae: Negative for fracture or mass. Discogenic changes on the left at L5-S1 have progressed in the interval. Conus medullaris and cauda equina: Conus extends to the L1-2 level. Conus and cauda equina appear normal. Paraspinal and other soft tissues: Negative for paraspinous mass, adenopathy, or fluid collection. Disc levels: L1-2: Mild degenerative change.  Negative for stenosis L2-3: Mild facet degeneration.  Negative for stenosis L3-4: Mild to moderate facet degeneration. Mild disc bulging. No significant stenosis. L4-5: Severe facet degeneration bilaterally with facet joint overgrowth and bilateral facet joint effusions. This is similar to the prior study. There is diffuse bulging  of the disc. Mild spinal stenosis and moderate subarticular stenosis similar to the prior study. L5-S1: Progressive disc degeneration and disc space narrowing. Sclerotic endplate changes and spurring on the left have progressed since the prior study. There is bilateral facet hypertrophy. There is moderate subarticular stenosis on the left which has progressed in the interval. Left paracentral disc protrusion shows significant interval improvement. There is improved mass effect on left S1 nerve root compared with the prior study. IMPRESSION: Mild spinal stenosis and moderate subarticular stenosis bilaterally L4-5 unchanged. Severe facet degeneration L4-5 Improvement in left-sided disc protrusion L5-S1 with less mass effect on the S1 nerve root. There is progressive disc degeneration and spurring on the left at L5-S1 with progression of subarticular stenosis on the left due to spurring. Electronically Signed   By: Franchot Gallo M.D.  On: 10/13/2019 15:05    Assessment & Plan:   [name] was seen today for hypothyroidism.  Diagnoses and all orders for this visit:  Hypertension, unspecified type- Her blood pressure is adequately well controlled but I am concerned her symptoms are related to the CCB.  I recommended that she change to a thiazide diuretic. -     EKG 12-Lead -     CBC with Differential/Platelet; Future -     Hepatic function panel; Future -     Hepatic function panel -     CBC with Differential/Platelet -     indapamide (LOZOL) 1.25 MG tablet; Take 1 tablet (1.25 mg total) by mouth daily. -     Urinalysis, Routine w reflex microscopic; Future -     Urinalysis, Routine w reflex microscopic  Acquired hypothyroidism- Her TSH is in the normal range.  She will stay on the current dose of levothyroxine. -     TSH; Future -     TSH -     levothyroxine (SYNTHROID) 75 MCG tablet; Take 1 tablet (75 mcg total) by mouth daily.  Bilateral leg edema- The work-up for secondary causes is  unremarkable.  I recommended that she stop taking amlodipine and to start indapamide. -     Hepatic function panel; Future -     Troponin I (High Sensitivity); Future -     Brain natriuretic peptide; Future -     D-dimer, quantitative; Future -     D-dimer, quantitative -     Brain natriuretic peptide -     Troponin I (High Sensitivity) -     Hepatic function panel -     indapamide (LOZOL) 1.25 MG tablet; Take 1 tablet (1.25 mg total) by mouth daily. -     Urinalysis, Routine w reflex microscopic; Future -     Urinalysis, Routine w reflex microscopic   I have discontinued [name]'s amLODipine. I am also having her start on indapamide. Additionally, I am having her maintain her azaTHIOprine, hydroxychloroquine, pantoprazole, metoprolol succinate, rosuvastatin, cyclobenzaprine, and levothyroxine.  Meds ordered this encounter  Medications  . indapamide (LOZOL) 1.25 MG tablet    Sig: Take 1 tablet (1.25 mg total) by mouth daily.    Dispense:  90 tablet    Refill:  0  . levothyroxine (SYNTHROID) 75 MCG tablet    Sig: Take 1 tablet (75 mcg total) by mouth daily.    Dispense:  90 tablet    Refill:  1     Follow-up: Return in about 3 months (around 11/08/2020).  Scarlette Calico, MD

## 2020-08-09 LAB — CBC WITH DIFFERENTIAL/PLATELET
Basophils Absolute: 0.1 10*3/uL (ref 0.0–0.1)
Basophils Relative: 2.1 % (ref 0.0–3.0)
Eosinophils Absolute: 0.2 10*3/uL (ref 0.0–0.7)
Eosinophils Relative: 5.4 % — ABNORMAL HIGH (ref 0.0–5.0)
HCT: 38.9 % (ref 36.0–46.0)
Hemoglobin: 13.1 g/dL (ref 12.0–15.0)
Lymphocytes Relative: 18.9 % (ref 12.0–46.0)
Lymphs Abs: 0.7 10*3/uL (ref 0.7–4.0)
MCHC: 33.7 g/dL (ref 30.0–36.0)
MCV: 100.3 fl — ABNORMAL HIGH (ref 78.0–100.0)
Monocytes Absolute: 0.7 10*3/uL (ref 0.1–1.0)
Monocytes Relative: 18.9 % — ABNORMAL HIGH (ref 3.0–12.0)
Neutro Abs: 2 10*3/uL (ref 1.4–7.7)
Neutrophils Relative %: 54.7 % (ref 43.0–77.0)
Platelets: 198 10*3/uL (ref 150.0–400.0)
RBC: 3.88 Mil/uL (ref 3.87–5.11)
RDW: 14.8 % (ref 11.5–15.5)
WBC: 3.6 10*3/uL — ABNORMAL LOW (ref 4.0–10.5)

## 2020-08-09 LAB — BRAIN NATRIURETIC PEPTIDE: Pro B Natriuretic peptide (BNP): 213 pg/mL — ABNORMAL HIGH (ref 0.0–100.0)

## 2020-08-09 LAB — HEPATIC FUNCTION PANEL
ALT: 14 U/L (ref 0–35)
AST: 21 U/L (ref 0–37)
Albumin: 3.7 g/dL (ref 3.5–5.2)
Alkaline Phosphatase: 85 U/L (ref 39–117)
Bilirubin, Direct: 0.1 mg/dL (ref 0.0–0.3)
Total Bilirubin: 0.4 mg/dL (ref 0.2–1.2)
Total Protein: 6.9 g/dL (ref 6.0–8.3)

## 2020-08-09 LAB — URINALYSIS, ROUTINE W REFLEX MICROSCOPIC
Bilirubin Urine: NEGATIVE
Hgb urine dipstick: NEGATIVE
Ketones, ur: NEGATIVE
Leukocytes,Ua: NEGATIVE
Nitrite: NEGATIVE
RBC / HPF: NONE SEEN (ref 0–?)
Specific Gravity, Urine: >=1.03 — AB (ref 1.000–1.030)
Total Protein, Urine: NEGATIVE
Urine Glucose: NEGATIVE
Urobilinogen, UA: 0.2 (ref 0.0–1.0)
pH: 6 (ref 5.0–8.0)

## 2020-08-09 LAB — TROPONIN I (HIGH SENSITIVITY): High Sens Troponin I: 8 ng/L (ref 2–17)

## 2020-08-09 LAB — TSH: TSH: 2.64 u[IU]/mL (ref 0.35–4.50)

## 2020-08-09 MED ORDER — LEVOTHYROXINE SODIUM 75 MCG PO TABS: 75.0000 ug | ORAL_TABLET | Freq: Every day | ORAL | 1 refills | Status: DC

## 2020-08-22 ENCOUNTER — Ambulatory Visit
Admission: RE | Admit: 2020-08-22 | Discharge: 2020-08-22 | Disposition: A | Discharge status: Home / Self Care | Payer: BC Managed Care – PPO | Source: Ambulatory Visit | Attending: Nephrology | Admitting: Nephrology

## 2020-08-22 ENCOUNTER — Other Ambulatory Visit: Payer: Self-pay

## 2020-08-22 DIAGNOSIS — N181 Chronic kidney disease, stage 1: Secondary | ICD-10-CM

## 2020-09-04 ENCOUNTER — Other Ambulatory Visit: Payer: Self-pay | Admitting: Nephrology

## 2020-09-04 DIAGNOSIS — N181 Chronic kidney disease, stage 1: Secondary | ICD-10-CM

## 2020-09-15 ENCOUNTER — Ambulatory Visit
Admission: RE | Admit: 2020-09-15 | Discharge: 2020-09-15 | Disposition: A | Discharge status: Home / Self Care | Payer: BC Managed Care – PPO | Source: Ambulatory Visit | Attending: Nephrology | Admitting: Nephrology

## 2020-09-15 ENCOUNTER — Other Ambulatory Visit: Payer: Self-pay

## 2020-09-15 DIAGNOSIS — N181 Chronic kidney disease, stage 1: Secondary | ICD-10-CM

## 2020-09-15 MED ORDER — GADOBENATE DIMEGLUMINE 529 MG/ML IV SOLN
20.0000 mL | Freq: Once | INTRAVENOUS | Status: AC | PRN
  Administered 2020-09-15: 20 mL via INTRAVENOUS

## 2020-10-12 ENCOUNTER — Ambulatory Visit (INDEPENDENT_AMBULATORY_CARE_PROVIDER_SITE_OTHER): Payer: BC Managed Care – PPO | Admitting: Endocrinology

## 2020-10-12 ENCOUNTER — Encounter: Payer: Self-pay | Admitting: Endocrinology

## 2020-10-12 ENCOUNTER — Other Ambulatory Visit: Payer: Self-pay

## 2020-10-12 DIAGNOSIS — R635 Abnormal weight gain: Secondary | ICD-10-CM | POA: Insufficient documentation

## 2020-10-12 MED ORDER — DEXAMETHASONE 1 MG PO TABS: 1.0000 mg | ORAL_TABLET | ORAL | 0 refills | Status: DC

## 2020-10-12 NOTE — Patient Instructions (Addendum)
Let's check a 24HR urine collection.  Please pick up a collection container from the lab at Dr Ronnald Ramp' office.   After you have finished that, you should do a "dexamethasone suppression test."  For this, you would take dexamethasone 1 mg at 10 pm (I have sent a prescription to your pharmacy), then come in for a "cortisol" blood test the next morning before 9 am.  You do not need to be fasting for this test. Please see a weight loss specialist.  you will receive a phone call, about a day and time for an appointment

## 2020-10-12 NOTE — Progress Notes (Signed)
Subjective:    Patient ID: [name], female    DOB: 01-04-1956, 65 y.o.   MRN: [id number]  HPI Pt is referred by Dr Justin Mend, for weight gain.  Pt reports she has gained 72 lbs x 7 months.  She has not recently taken any steroids.  She has no h/o cancer, pituitary disorder, cataracts, PUD, osteoporosis, DM, bony fracture, or adrenal disorder.  Synthroid dosage has been 75/d, x a few years.   Past Medical History:  Diagnosis Date   CAD (coronary artery disease)    a. remote stents/angioplasty. b. s/p CABG 01/2017 with mitral valve annuloplasty.   Esophageal reflux    GERD (gastroesophageal reflux disease)    Hypothyroidism    Immune thrombocytopenic purpura (HCC)    Lupus erythematosus    Mitral valve insufficiency and aortic valve insufficiency    Myocardial infarction (HCC)    x 2   PONV (postoperative nausea and vomiting)    Postoperative atrial fibrillation (HCC)    Severe mitral regurgitation    a. s/p MV annuloplasty 01/2017 at time of CABG.   Status post mitral valve annuloplasty    Unspecified disease of pericardium     Past Surgical History:  Procedure Laterality Date   CHOLECYSTECTOMY, LAPAROSCOPIC  2010   CORONARY ARTERY BYPASS GRAFT N/A 01/30/2017   Procedure: CORONARY ARTERY BYPASS GRAFTING (CABG), Times four  using the right saphaneous vein, harvested endoscopicly.  and left internal mammary artery .;  Surgeon: Gaye Pollack, MD;  Location: [facility];  Service: Open Heart Surgery;  Laterality: N/A;   LEFT HEART CATH AND CORONARY ANGIOGRAPHY N/A 01/23/2017   Procedure: LEFT HEART CATH AND CORONARY ANGIOGRAPHY;  Surgeon: Belva Crome, MD;  Location: [facility];  Service: Cardiovascular;  Laterality: N/A;   MITRAL VALVE REPAIR N/A 01/30/2017   Procedure: MITRAL VALVE REPAIR (MVR);  Surgeon: Gaye Pollack, MD;  Location: [facility];  Service: Open Heart Surgery;  Laterality: N/A;   plastic surgical repair      dog bite to leg   SPLENECTOMY  5-04   TEE WITHOUT  CARDIOVERSION N/A 01/27/2017   Procedure: TRANSESOPHAGEAL ECHOCARDIOGRAM (TEE);  Surgeon: Sanda Klein, MD;  Location: [facility];  Service: Cardiovascular;  Laterality: N/A;   TEE WITHOUT CARDIOVERSION N/A 01/30/2017   Procedure: TRANSESOPHAGEAL ECHOCARDIOGRAM (TEE);  Surgeon: Gaye Pollack, MD;  Location: [facility];  Service: Open Heart Surgery;  Laterality: N/A;   ULTRASOUND GUIDANCE FOR VASCULAR ACCESS  01/23/2017   Procedure: Ultrasound Guidance For Vascular Access;  Surgeon: Belva Crome, MD;  Location: [place] [facility];  Service: Cardiovascular;;    Social History   Socioeconomic History   Marital status: Single    Spouse name: Not on file   Number of children: Not on file   Years of education: Not on file   Highest education level: Not on file  Occupational History   Not on file  Tobacco Use   Smoking status: Former    Packs/day: 0.50    Years: 30.00    Pack years: 15.00    Types: Cigarettes    Quit date: 12/23/2011    Years since quitting: 8.8   Smokeless tobacco: Never  Vaping Use   Vaping Use: Former  Substance and Sexual Activity   Alcohol use: No   Drug use: No   Sexual activity: Not Currently    Comment: 1st intercourse 65 yo-More than 5 partners  Other Topics Concern   Not on file  Social History Narrative   HSG,  graduated from [employer] in [place]. In college [name] -grad '13 with relgious studies. [employer] - Fall '13 for MA-divinity. Marrried '73 - 2 years/ divorced. 2 son- '73, '75 - CP.    Occupation: full-time Ship broker. She lives alone with her younger son living with her part-time but he resides in a managed care facility.    Social Determinants of Health   Financial Resource Strain: Low Risk    Difficulty of Paying Living Expenses: Not hard at all  Food Insecurity: No Food Insecurity   Worried About Charity fundraiser in the Last Year: Never true   [place] in the Last Year: Never true  Transportation Needs: No  Transportation Needs   Lack of Transportation (Medical): No   Lack of Transportation (Non-Medical): No  Physical Activity: Not on file  Stress: No Stress Concern Present   Feeling of Stress : Not at all  Social Connections: Moderately Integrated   Frequency of Communication with Friends and Family: More than three times a week   Frequency of Social Gatherings with Friends and Family: More than three times a week   Attends Religious Services: More than 4 times per year   Active Member of [employer] or Organizations: Yes   Attends Music therapist: More than 4 times per year   Marital Status: Divorced  Human resources officer Violence: Not on file    Current Outpatient Medications on File Prior to Visit  Medication Sig Dispense Refill   azaTHIOprine (IMURAN) 50 MG tablet Take 50 mg by mouth 2 (two) times daily.     cyclobenzaprine (FLEXERIL) 5 MG tablet Take 5 mg by mouth 3 (three) times daily as needed.     hydroxychloroquine (PLAQUENIL) 200 MG tablet Take 200 mg by mouth daily.      indapamide (LOZOL) 1.25 MG tablet Take 1 tablet (1.25 mg total) by mouth daily. 90 tablet 0   levothyroxine (SYNTHROID) 75 MCG tablet Take 1 tablet (75 mcg total) by mouth daily. 90 tablet 1   metoprolol succinate (TOPROL-XL) 50 MG 24 hr tablet Take 1 tablet (50 mg total) by mouth daily. 90 tablet 3   pantoprazole (PROTONIX) 40 MG tablet TAKE 1 TABLET BY MOUTH EVERY DAY 90 tablet 3   rosuvastatin (CRESTOR) 20 MG tablet Take 1 tablet (20 mg total) by mouth daily. 90 tablet 2   No current facility-administered medications on file prior to visit.    Allergies  Allergen Reactions   Amlodipine Swelling    Leg edema    Family History  Problem Relation Age of Onset   Cancer Mother        breast and cervical   Paget's disease of bone Mother    Fibromyalgia Mother    Breast cancer Mother    Hypertension Father    Heart disease Father        PVD   GER disease Father    Cancer Paternal Grandfather         colon   Heart attack Maternal Grandmother     BP 120/90 (BP Location: Left Arm, Patient Position: Sitting, Cuff Size: Normal)   Pulse 77   Ht '5\' 7"'$  (1.702 m)   Wt 277 lb 9.6 oz (125.9 kg)   SpO2 99%   BMI 43.48 kg/m      Review of Systems denies headache, muscle weakness, depression, and rash on the abdomen, and easy bruising.  Objective:   Physical Exam VS: see vs page GEN: no distress HEAD: head: no deformity eyes: no periorbital swelling, no proptosis external nose and ears are normal NECK: supple, thyroid is not enlarged CHEST WALL: Old healed surgical scar (median sternotomy) LUNGS: clear to auscultation CV: reg rate and rhythm, no murmur.  MUSCULOSKELETAL: gait is normal and steady EXTEMITIES: no deformity.  1+ bilat leg edema NEURO:  readily moves all 4's.  sensation is intact to touch on all 4's SKIN:  Normal texture and temperature.  No rash or suspicious lesion is visible.  No striae.    NODES:  None palpable at the neck PSYCH: alert, well-oriented.  Does not appear anxious nor depressed.  Lab Results  Component Value Date   TSH 2.64 08/08/2020    Chest CT (2020): no mention is made of the thyroid.   MRI (2022): normal adrenals.   MRI brain (2003); no mention is made of the pituitary.    I have reviewed outside records, and summarized: Pt was noted to have weight gain, and referred here.  He was also recently see by Dr Ronnald Ramp, for hypothyroidism     Assessment & Plan:  Weight gain, new to me, uncertain etiology and prognosis   Patient Instructions  Let's check a 24HR urine collection.  Please pick up a collection container from the lab at Dr Ronnald Ramp' office.   After you have finished that, you should do a "dexamethasone suppression test."  For this, you would take dexamethasone 1 mg at 10 pm (I have sent a prescription to your pharmacy), then come in for a "cortisol" blood test the next morning before 9 am.  You do not need to be fasting for this  test. Please see a weight loss specialist.  you will receive a phone call, about a day and time for an appointment

## 2020-10-21 ENCOUNTER — Other Ambulatory Visit: Payer: Self-pay | Admitting: Interventional Cardiology

## 2020-10-26 ENCOUNTER — Other Ambulatory Visit (INDEPENDENT_AMBULATORY_CARE_PROVIDER_SITE_OTHER): Payer: BC Managed Care – PPO

## 2020-10-26 ENCOUNTER — Other Ambulatory Visit: Payer: Self-pay

## 2020-10-26 DIAGNOSIS — R635 Abnormal weight gain: Secondary | ICD-10-CM

## 2020-10-26 LAB — CORTISOL: Cortisol, Plasma: 0.7 ug/dL

## 2020-10-29 ENCOUNTER — Telehealth (INDEPENDENT_AMBULATORY_CARE_PROVIDER_SITE_OTHER): Payer: BC Managed Care – PPO | Admitting: Nurse Practitioner

## 2020-10-29 ENCOUNTER — Encounter: Payer: Self-pay | Admitting: Nurse Practitioner

## 2020-10-29 VITALS — BP 114/65 | HR 78 | Temp 99.8°F | Ht 67.0 in | Wt 250.0 lb

## 2020-10-29 DIAGNOSIS — U071 COVID-19: Secondary | ICD-10-CM

## 2020-10-29 MED ORDER — PROMETHAZINE-DM 6.25-15 MG/5ML PO SYRP
5.0000 mL | ORAL_SOLUTION | Freq: Three times a day (TID) | ORAL | 0 refills | Status: DC | PRN
Start: 2020-10-29 — End: 2021-01-09

## 2020-10-29 MED ORDER — MOLNUPIRAVIR EUA 200MG CAPSULE: 4.0000 | ORAL_CAPSULE | Freq: Two times a day (BID) | ORAL | 0 refills | Status: AC

## 2020-10-29 NOTE — Patient Instructions (Signed)
Maintain adequate oral hydration. Start molnuparivir and promethazine DM as prescribed Provided ED precautions. F/up with pcp in 1week.  COVID-19: What to Do if You Are Sick CDC has updated isolation and quarantine recommendations for the public, and is revising the CDC website to reflect these changes. These recommendations do not apply to healthcare personnel and do not supersede state, local, tribal, or territorial laws, rules, andregulations. If you have a fever, cough or other symptoms, you might have COVID-19. Most people have mild illness and are able to recover at home. If you are sick: Keep track of your symptoms. If you have an emergency warning sign (including trouble breathing), call 911. Steps to help prevent the spread of COVID-19 if you are sick If you are sick with COVID-19 or think you might have COVID-19, follow the steps below to care for yourself and to help protect other peoplein your home and community. Stay home except to get medical care Stay home. Most people with COVID-19 have mild illness and can recover at home without medical care. Do not leave your home, except to get medical care. Do not visit public areas. Take care of yourself. Get rest and stay hydrated. Take over-the-counter medicines, such as acetaminophen, to help you feel better. Stay in touch with your doctor. Call before you get medical care. Be sure to get care if you have trouble breathing, or have any other emergency warning signs, or if you think it is an emergency. Avoid public transportation, ride-sharing, or taxis. Separate yourself from other people As much as possible, stay in a specific room and away from other people and pets in your home. If possible, you should use a separate bathroom. If you need to be around other people or animals in oroutside of the home, wear a mask. Tell your close contactsthat they may have been exposed to COVID-19. An infected person can spread COVID-19 starting 48 hours  (or 2 days) before the person has any symptoms or tests positive. By letting your close contacts know they may have been exposed to COVID-19, you are helping to protect everyone. Additional guidance is available for those living in close quarters and shared housing. See COVID-19 and Animals if you have questions about pets. If you are diagnosed with COVID-19, someone from the health department may call you. Answer the call to slow the spread. Monitor your symptoms Symptoms of COVID-19 include fever, cough, or other symptoms. Follow care instructions from your healthcare provider and local health department. Your local health authorities may give instructions on checking your symptoms and reporting information. When to seek emergency medical attention Look for emergency warning signs* for COVID-19. If someone is showing any of these signs, seek emergency medical care immediately: Trouble breathing Persistent pain or pressure in the chest New confusion Inability to wake or stay awake Pale, gray, or blue-colored skin, lips, or nail beds, depending on skin tone *This list is not all possible symptoms. Please call your medical provider forany other symptoms that are severe or concerning to you. Call 911 or call ahead to your local emergency facility: Notify the operator that you are seeking care for someone who has or may haveCOVID-19. Call ahead before visiting your doctor Call ahead. Many medical visits for routine care are being postponed or done by phone or telemedicine. If you have a medical appointment that cannot be postponed, call your doctor's office, and tell them you have or may have COVID-19. This will help the office protect themselves and other patients.  Get tested If you have symptoms of COVID-19, get tested. While waiting for test results, you stay away from others, including staying apart from those living in your household. Self-tests are one of several options for testing for the  virus that causes COVID-19 and may be more convenient than laboratory-based tests and point-of-care tests. Ask your healthcare provider or your local health department if you need help interpreting your test results. You can visit your state, tribal, local, and territorial health department's website to look for the latest local information on testing sites. If you are sick, wear a mask over your nose and mouth You should wear a mask over your nose and mouth if you must be around other people or animals, including pets (even at home). You don't need to wear the mask if you are alone. If you can't put on a mask (because of trouble breathing, for example), cover your coughs and sneezes in some other way. Try to stay at least 6 feet away from other people. This will help protect the people around you. Masks should not be placed on young children under age 62 years, anyone who has trouble breathing, or anyone who is not able to remove the mask without help. Note: During the COVID-19 pandemic, medical grade facemasks are reserved forhealthcare workers and some first responders. Cover your coughs and sneezes Cover your mouth and nose with a tissue when you cough or sneeze. Throw away used tissues in a lined trash can. Immediately wash your hands with soap and water for at least 20 seconds. If soap and water are not available, clean your hands with an alcohol-based hand sanitizer that contains at least 60% alcohol. Clean your hands often Wash your hands often with soap and water for at least 20 seconds. This is especially important after blowing your nose, coughing, or sneezing; going to the bathroom; and before eating or preparing food. Use hand sanitizer if soap and water are not available. Use an alcohol-based hand sanitizer with at least 60% alcohol, covering all surfaces of your hands and rubbing them together until they feel dry. Soap and water are the best option, especially if hands are visibly  dirty. Avoid touching your eyes, nose, and mouth with unwashed hands. Handwashing Tips Avoid sharing personal household items Do not share dishes, drinking glasses, cups, eating utensils, towels, or bedding with other people in your home. Wash these items thoroughly after using them with soap and water or put in the dishwasher. Clean all "high-touch" surfaces every day Clean and disinfect high-touch surfaces in your "sick room" and bathroom; wear disposable gloves. Let someone else clean and disinfect surfaces in common areas, but you should clean your bedroom and bathroom, if possible. If a caregiver or other person needs to clean and disinfect a sick person's bedroom or bathroom, they should do so on an as-needed basis. The caregiver/other person should wear a mask and disposable gloves prior to cleaning. They should wait as long as possible after the person who is sick has used the bathroom before coming in to clean and use the bathroom. High-touch surfaces include phones, remote controls, counters, tabletops, doorknobs, bathroom fixtures, toilets, keyboards, tablets, and bedside tables. Clean and disinfect areas that may have blood, stool, or body fluids on them. Use household cleaners and disinfectants. Clean the area or item with soap and water or another detergent if it is dirty. Then, use a household disinfectant. Be sure to follow the instructions on the label to ensure safe and effective  use of the product. Many products recommend keeping the surface wet for several minutes to ensure germs are killed. Many also recommend precautions such as wearing gloves and making sure you have good ventilation during use of the product. Use a product from [employer]: Disinfectants for Coronavirus ([id number]). Complete Disinfection Guidance When you can be around others after being sick with COVID-19 Deciding when you can be around others is different for different situations. Find out when you can  safely end home isolation. For any additional questions about your care,contact your healthcare provider or state or local health department. 02/29/2020 Content source: [facility] ([facility]), Division of Viral Diseases This information is not intended to replace advice given to you by your health care provider. Make sure you discuss any questions you have with your healthcare provider. Document Revised: 04/27/2020 Document Reviewed: 04/27/2020 Elsevier Patient Education  [name].

## 2020-10-29 NOTE — Progress Notes (Signed)
Virtual Visit via Telephone Note  I connected withNAME@ on 10/29/20 at 11:00 AM EDT by a video enabled telemedicine application and verified that I am speaking with the correct person using two identifiers. Interactive audio and video telecommunications were attempted between this provider and patient, however failed, due to patient having technical difficulties OR patient did not have access to video capability.  We continued and completed visit with audio only.    Location: Patient:Home Provider: Office Participants: patient and provider  I discussed the limitations of evaluation and management by telemedicine and the availability of in person appointments. I also discussed with the patient that there may be a patient responsible charge related to this service. The patient expressed understanding and agreed to proceed.  CC: URI symptoms x 3days and positive COVID test.  History of Present Illness: URI  This is a new problem. The current episode started in the past 7 days. The problem has been unchanged. The maximum temperature recorded prior to her arrival was 100.4 - 100.9 F. The fever has been present for 1 to 2 days. Associated symptoms include congestion, coughing, headaches, joint pain, rhinorrhea, sinus pain, a sore throat and wheezing. Pertinent negatives include no abdominal pain, chest pain, diarrhea, dysuria, ear pain, joint swelling, nausea, neck pain, plugged ear sensation, rash, sneezing, swollen glands or vomiting. She has tried acetaminophen, decongestant and increased fluids for the symptoms. The treatment provided mild relief.   Current use of imuran and crestor. Has received 4covid doses. Last BMP check 54month ago.  Observations/Objective: Alert and orientedx4 Normal speech Limited exam due to telephone call  Assessment and Plan: AHeilywas seen today for acute visit.  Diagnoses and all orders for this visit:  COVID-19 -     molnupiravir EUA 200 mg CAPS; Take 4  capsules (800 mg total) by mouth 2 (two) times daily for 5 days. -     promethazine-dextromethorphan (PROMETHAZINE-DM) 6.25-15 MG/5ML syrup; Take 5 mLs by mouth 3 (three) times daily as needed for cough.  Follow Up Instructions: Maintain adequate oral hydration. Start molnuparivir and promethazine DM as prescribed Advised her about the possible side effects of molnuparivir as well as possible risk of rebound symptoms. She is to call the office if this happens. Provided ED precautions. F/up with pcp in 1week.   I discussed the assessment and treatment plan with the patient. The patient was provided an opportunity to ask questions and all were answered. The patient agreed with the plan and demonstrated an understanding of the instructions.   The patient was advised to call back or seek an in-person evaluation if the symptoms worsen or if the condition fails to improve as anticipated.  I have spent 15ms with this patient regarding history taking, documentation, and formulating plan and discussing treatment options with patient.   ChWilfred LacyNP

## 2020-10-30 LAB — CORTISOL, URINE, 24 HOUR
24 Hour urine volume (VMAHVA): 1200 mL
CREATININE, URINE: 0.45 g/(24.h) — ABNORMAL LOW (ref 0.50–2.15)
Cortisol (Ur), Free: 2.2 mcg/24 h — ABNORMAL LOW (ref 4.0–50.0)

## 2020-11-04 ENCOUNTER — Other Ambulatory Visit: Payer: Self-pay | Admitting: Internal Medicine

## 2020-11-04 DIAGNOSIS — I10 Essential (primary) hypertension: Secondary | ICD-10-CM

## 2020-11-04 DIAGNOSIS — R600 Localized edema: Secondary | ICD-10-CM

## 2020-11-09 ENCOUNTER — Ambulatory Visit: Payer: BC Managed Care – PPO | Admitting: Nurse Practitioner

## 2020-11-12 ENCOUNTER — Other Ambulatory Visit: Payer: Self-pay | Admitting: Interventional Cardiology

## 2020-11-12 ENCOUNTER — Other Ambulatory Visit: Payer: Self-pay

## 2020-11-12 ENCOUNTER — Encounter: Payer: Self-pay | Admitting: Nurse Practitioner

## 2020-11-12 ENCOUNTER — Ambulatory Visit (INDEPENDENT_AMBULATORY_CARE_PROVIDER_SITE_OTHER): Payer: BC Managed Care – PPO | Admitting: Nurse Practitioner

## 2020-11-12 VITALS — BP 116/78 | HR 71 | Temp 97.0°F | Ht 67.0 in | Wt 273.2 lb

## 2020-11-12 DIAGNOSIS — U099 Post covid-19 condition, unspecified: Secondary | ICD-10-CM | POA: Diagnosis not present

## 2020-11-12 DIAGNOSIS — K219 Gastro-esophageal reflux disease without esophagitis: Secondary | ICD-10-CM | POA: Diagnosis not present

## 2020-11-12 NOTE — Progress Notes (Signed)
Subjective:  Patient ID: [name], female    DOB: 21-Jun-1955  Age: 65 y.o. MRN: [id number]  CC: Follow-up (Pt following up post COVID. Pt states she has been feeling better but has a cough that comes and goes)  Cough This is a new problem. The current episode started 1 to 4 weeks ago. The problem has been gradually improving. The problem occurs every few hours. The cough is Non-productive. Pertinent negatives include no chest pain, chills, fever, hemoptysis, nasal congestion, postnasal drip, shortness of breath or wheezing. The symptoms are aggravated by lying down. She has tried prescription cough suppressant for the symptoms. The treatment provided significant relief. There is no history of asthma, bronchiectasis, bronchitis, emphysema, environmental allergies or pneumonia.  COVID infection treated with molnupiravir, denies any adverse effects from medication.  She also resolved GERD symptoms at bedtime despite use of pantoprazole in AM and Tums at bedtime. Admits to eating non GERD diet. She does avoid food intake with 2hrs of bedtime. No nausea, no vomiting, no melena, no weight loss, no dysphagia, no hoarseness.  Wt Readings from Last 3 Encounters:  11/12/20 273 lb 3.2 oz (123.9 kg)  10/29/20 250 lb (113.4 kg)  10/12/20 277 lb 9.6 oz (125.9 kg)    Reviewed past Medical, Social and Family history today.  Outpatient Medications Prior to Visit  Medication Sig Dispense Refill   azaTHIOprine (IMURAN) 50 MG tablet Take 50 mg by mouth 2 (two) times daily.     cyclobenzaprine (FLEXERIL) 5 MG tablet Take 5 mg by mouth 3 (three) times daily as needed.     dexamethasone (DECADRON) 1 MG tablet Take 1 tablet (1 mg total) by mouth See admin instructions. Take at 9-10 PM, the night before blood test 1 tablet 0   hydroxychloroquine (PLAQUENIL) 200 MG tablet Take 200 mg by mouth daily.      indapamide (LOZOL) 1.25 MG tablet TAKE 1 TABLET BY MOUTH DAILY. 90 tablet 0   levothyroxine  (SYNTHROID) 75 MCG tablet Take 1 tablet (75 mcg total) by mouth daily. 90 tablet 1   metoprolol succinate (TOPROL-XL) 50 MG 24 hr tablet Take 1 tablet (50 mg total) by mouth daily. 90 tablet 3   pantoprazole (PROTONIX) 40 MG tablet TAKE 1 TABLET BY MOUTH EVERY DAY 90 tablet 3   promethazine-dextromethorphan (PROMETHAZINE-DM) 6.25-15 MG/5ML syrup Take 5 mLs by mouth 3 (three) times daily as needed for cough. 240 mL 0   rosuvastatin (CRESTOR) 20 MG tablet Take 1 tablet (20 mg total) by mouth daily. 90 tablet 2   No facility-administered medications prior to visit.   ROS See HPI  Objective:  BP 116/78 (BP Location: Left Arm, Patient Position: Sitting, Cuff Size: Large)   Pulse 71   Temp (!) 97 F (36.1 C) (Temporal)   Ht '5\' 7"'$  (1.702 m)   Wt 273 lb 3.2 oz (123.9 kg)   SpO2 98%   BMI 42.79 kg/m   Physical Exam Vitals reviewed.  Cardiovascular:     Rate and Rhythm: Normal rate and regular rhythm.     Pulses: Normal pulses.     Heart sounds: Normal heart sounds.  Pulmonary:     Effort: Pulmonary effort is normal.     Breath sounds: Normal breath sounds.  Neurological:     Mental Status: She is alert and oriented to person, place, and time.   Assessment & Plan:  This visit occurred during the SARS-CoV-2 public health emergency.  Safety protocols were in place, including screening  questions prior to the visit, additional usage of staff PPE, and extensive cleaning of exam room while observing appropriate contact time as indicated for disinfecting solutions.   [name] was seen today for follow-up.  Diagnoses and all orders for this visit:  Post-COVID syndrome  Gastroesophageal reflux disease without esophagitis Switch pantoprazole to bedtime. Maintain GERD diet Discuss need for carafate if no improvement with above changes. Cough relegated to COVID infection will resolve eventually. Continue use of robitussin or delysm as needed for cough.  Problem List Items Addressed This  Visit       Digestive   GERD (Chronic)   Other Visit Diagnoses     Post-COVID syndrome    -  Primary       Follow-up: Return if symptoms worsen or fail to improve.  Wilfred Lacy, NP

## 2020-11-12 NOTE — Patient Instructions (Signed)
Switch pantoprazole to bedtime. Maintain GERD diet Discuss need for carafate if no improvement with above changes.  Cough relegated to COVID infection will resolve eventually. Continue use of robitussin or delysm as needed for cough.  Food Choices for Gastroesophageal Reflux Disease, Adult When you have gastroesophageal reflux disease (GERD), the foods you eat and your eating habits are very important. Choosing the right foods can help ease your discomfort. Think about working with a food expert (dietitian) to help you make good choices. What are tips for following this plan? Reading food labels Look for foods that are low in saturated fat. Foods that may help with your symptoms include: Foods that have less than 5% of daily value (DV) of fat. Foods that have 0 grams of trans fat. Cooking Do not fry your food. Cook your food by baking, steaming, grilling, or broiling. These are all methods that do not need a lot of fat for cooking. To add flavor, try to use herbs that are low in spice and acidity. Meal planning  Choose healthy foods that are low in fat, such as: Fruits and vegetables. Whole grains. Low-fat dairy products. Lean meats, fish, and poultry. Eat small meals often instead of eating 3 large meals each day. Eat your meals slowly in a place where you are relaxed. Avoid bending over or lying down until 2-3 hours after eating. Limit high-fat foods such as fatty meats or fried foods. Limit your intake of fatty foods, such as oils, butter, and shortening. Avoid the following as told by your doctor: Foods that cause symptoms. These may be different for different people. Keep a food diary to keep track of foods that cause symptoms. Alcohol. Drinking a lot of liquid with meals. Eating meals during the 2-3 hours before bed.  Lifestyle Stay at a healthy weight. Ask your doctor what weight is healthy for you. If you need to lose weight, work with your doctor to do so safely. Exercise  for at least 30 minutes on 5 or more days each week, or as told by your doctor. Wear loose-fitting clothes. Do not smoke or use any products that contain nicotine or tobacco. If you need help quitting, ask your doctor. Sleep with the head of your bed higher than your feet. Use a wedge under the mattress or blocks under the bed frame to raise the head of the bed. Chew sugar-free gum after meals. What foods should eat?  Eat a healthy, well-balanced diet of fruits, vegetables, whole grains, low-fatdairy products, lean meats, fish, and poultry. Each person is different. Foods that may cause symptoms in one person may not cause any symptoms inanother person. Work with your doctor to find foods that are safe for you. The items listed above may not be a complete list of what you can eat and drink. Contact a food expert for more options. What foods should I avoid? Limiting some of these foods may help in managing the symptoms of GERD. Everyone is different. Talk with a food expert or your doctor to help you findthe exact foods to avoid, if any. Fruits Any fruits prepared with added fat. Any fruits that cause symptoms. For some people, this may include citrus fruits, such as oranges, grapefruit, pineapple,and lemons. Vegetables Deep-fried vegetables. [place] fries. Any vegetables prepared with added fat. Any vegetables that cause symptoms. For some people, this may include tomatoesand tomato products, chili peppers, onions and garlic, and horseradish. Grains Pastries or quick breads with added fat. Meats and other proteins High-fat meats,  such as fatty beef or pork, hot dogs, ribs, ham, sausage, salami, and bacon. Fried meat or protein, including fried fish and friedchicken. Nuts and nut butters, in large amounts. Dairy Whole milk and chocolate milk. Sour cream. Cream. Ice cream. Cream cheese.Milkshakes. Fats and oils Butter. Margarine. Shortening. Ghee. Beverages Coffee and tea, with or without  caffeine. Carbonated beverages. Sodas. Energy drinks. Fruit juice made with acidic fruits, such as orange or grapefruit.Tomato juice. Alcoholic drinks. Sweets and desserts Chocolate and cocoa. Donuts. Seasonings and condiments Pepper. Peppermint and spearmint. Added salt. Any condiments, herbs, or seasonings that cause symptoms. For some people, this may include curry, hotsauce, or vinegar-based salad dressings. The items listed above may not be a complete list of what you should not eat and drink. Contact a food expert for more options. Questions to ask your doctor Diet and lifestyle changes are often the first steps that are taken to manage symptoms of GERD. If diet and lifestyle changes do not help, talk with yourdoctor about taking medicines. Where to find more information [employer]: aboutgerd.org Summary When you have GERD, food and lifestyle choices are very important in easing your symptoms. Eat small meals often instead of 3 large meals a day. Eat your meals slowly and in a place where you are relaxed. Avoid bending over or lying down until 2-3 hours after eating. Limit high-fat foods such as fatty meats or fried foods. This information is not intended to replace advice given to you by your health care provider. Make sure you discuss any questions you have with your healthcare provider. Document Revised: 09/19/2019 Document Reviewed: 09/19/2019 Elsevier Patient Education  [place].

## 2020-12-18 ENCOUNTER — Other Ambulatory Visit: Payer: Self-pay | Admitting: Interventional Cardiology

## 2020-12-31 ENCOUNTER — Other Ambulatory Visit: Payer: Self-pay | Admitting: Internal Medicine

## 2020-12-31 DIAGNOSIS — Z1231 Encounter for screening mammogram for malignant neoplasm of breast: Secondary | ICD-10-CM

## 2021-01-08 NOTE — Progress Notes (Signed)
Cardiology Office Note:    Date:  01/09/2021   ID:  [name], DOB 06-Nov-1955, MRN [id number]  PCP:  Janith Lima, MD  Cardiologist:  Sinclair Grooms, MD   Referring MD: Janith Lima, MD   Chief Complaint  Patient presents with   Coronary Artery Disease   Atrial Fibrillation     History of Present Illness:    [name] is a 65 y.o. female with a hx of lupus erythematosus, coronary artery disease with prior history of stenting and ultimate recent four-vessel coronary bypass grafting 2018, mitral valve annuloplasty at the time of CABG in 2018, postoperative atrial fibrillation, PAD, and ascending aortic aneurysm.   Not having angina.  No episodes of atrial fibrillation that she is aware of.  She denies orthopnea PND.  No peripheral edema.  No episodes of syncope.  Past Medical History:  Diagnosis Date   CAD (coronary artery disease)    a. remote stents/angioplasty. b. s/p CABG 01/2017 with mitral valve annuloplasty.   Esophageal reflux    GERD (gastroesophageal reflux disease)    Hypothyroidism    Immune thrombocytopenic purpura (HCC)    Lupus erythematosus    Mitral valve insufficiency and aortic valve insufficiency    Myocardial infarction (HCC)    x 2   PONV (postoperative nausea and vomiting)    Postoperative atrial fibrillation (HCC)    Severe mitral regurgitation    a. s/p MV annuloplasty 01/2017 at time of CABG.   Status post mitral valve annuloplasty    Unspecified disease of pericardium     Past Surgical History:  Procedure Laterality Date   CHOLECYSTECTOMY, LAPAROSCOPIC  2010   CORONARY ARTERY BYPASS GRAFT N/A 01/30/2017   Procedure: CORONARY ARTERY BYPASS GRAFTING (CABG), Times four  using the right saphaneous vein, harvested endoscopicly.  and left internal mammary artery .;  Surgeon: Gaye Pollack, MD;  Location: [facility];  Service: Open Heart Surgery;  Laterality: N/A;   LEFT HEART CATH AND CORONARY ANGIOGRAPHY N/A 01/23/2017    Procedure: LEFT HEART CATH AND CORONARY ANGIOGRAPHY;  Surgeon: Belva Crome, MD;  Location: [facility];  Service: Cardiovascular;  Laterality: N/A;   MITRAL VALVE REPAIR N/A 01/30/2017   Procedure: MITRAL VALVE REPAIR (MVR);  Surgeon: Gaye Pollack, MD;  Location: [facility];  Service: Open Heart Surgery;  Laterality: N/A;   plastic surgical repair      dog bite to leg   SPLENECTOMY  5-04   TEE WITHOUT CARDIOVERSION N/A 01/27/2017   Procedure: TRANSESOPHAGEAL ECHOCARDIOGRAM (TEE);  Surgeon: Sanda Klein, MD;  Location: [facility] ENDOSCOPY;  Service: Cardiovascular;  Laterality: N/A;   TEE WITHOUT CARDIOVERSION N/A 01/30/2017   Procedure: TRANSESOPHAGEAL ECHOCARDIOGRAM (TEE);  Surgeon: Gaye Pollack, MD;  Location: [facility];  Service: Open Heart Surgery;  Laterality: N/A;   ULTRASOUND GUIDANCE FOR VASCULAR ACCESS  01/23/2017   Procedure: Ultrasound Guidance For Vascular Access;  Surgeon: Belva Crome, MD;  Location: [facility];  Service: Cardiovascular;;    Current Medications: Current Meds  Medication Sig   azaTHIOprine (IMURAN) 50 MG tablet Take 50 mg by mouth 2 (two) times daily.   cyclobenzaprine (FLEXERIL) 5 MG tablet Take 5 mg by mouth 3 (three) times daily as needed.   hydroxychloroquine (PLAQUENIL) 200 MG tablet Take 200 mg by mouth daily.    indapamide (LOZOL) 1.25 MG tablet TAKE 1 TABLET BY MOUTH DAILY.   levothyroxine (SYNTHROID) 75 MCG tablet Take 1 tablet (75  mcg total) by mouth daily.   metoprolol succinate (TOPROL-XL) 50 MG 24 hr tablet Take 1 tablet (50 mg total) by mouth daily. Keep follow up for future refills.   pantoprazole (PROTONIX) 40 MG tablet TAKE 1 TABLET BY MOUTH EVERY DAY   rosuvastatin (CRESTOR) 20 MG tablet Take 1 tablet (20 mg total) by mouth daily. Please keep upcoming appt for future refills.     Allergies:   Amlodipine   Social History   Socioeconomic History   Marital status: Single    Spouse name: Not on file   Number of children: Not on  file   Years of education: Not on file   Highest education level: Not on file  Occupational History   Not on file  Tobacco Use   Smoking status: Former    Packs/day: 0.50    Years: 30.00    Pack years: 15.00    Types: Cigarettes    Quit date: 12/23/2011    Years since quitting: 9.0   Smokeless tobacco: Never  Vaping Use   Vaping Use: Former  Substance and Sexual Activity   Alcohol use: No   Drug use: No   Sexual activity: Not Currently    Comment: 1st intercourse 65 yo-More than 5 partners  Other Topics Concern   Not on file  Social History Narrative   HSG,  graduated from [employer] in [place]. In college [name] -grad '13 with relgious studies. [employer] - Fall '13 for MA-divinity. Marrried '73 - 2 years/ divorced. 2 son- '73, '75 - CP.    Occupation: full-time Ship broker. She lives alone with her younger son living with her part-time but he resides in a managed care facility.    Social Determinants of Health   Financial Resource Strain: Low Risk    Difficulty of Paying Living Expenses: Not hard at all  Food Insecurity: No Food Insecurity   Worried About Charity fundraiser in the Last Year: Never true   [place] in the Last Year: Never true  Transportation Needs: No Transportation Needs   Lack of Transportation (Medical): No   Lack of Transportation (Non-Medical): No  Physical Activity: Not on file  Stress: No Stress Concern Present   Feeling of Stress : Not at all  Social Connections: Moderately Integrated   Frequency of Communication with Friends and Family: More than three times a week   Frequency of Social Gatherings with Friends and Family: More than three times a week   Attends Religious Services: More than 4 times per year   Active Member of [employer] or Organizations: Yes   Attends Music therapist: More than 4 times per year   Marital Status: Divorced     Family History: The patient's family history includes Breast cancer in her  mother; Cancer in her mother and paternal grandfather; Fibromyalgia in her mother; GER disease in her father; Heart attack in her maternal grandmother; Heart disease in her father; Hypertension in her father; Paget's disease of bone in her mother.  ROS:   Please see the history of present illness.    Overweight.  Discussed her weight gain.  Have an orthopedic issues.  All other systems reviewed and are negative.  EKGs/Labs/Other Studies Reviewed:    The following studies were reviewed today: CT chest 2020: IMPRESSION: 1. Stable uncomplicated mild fusiform ectasia of the ascending thoracic aorta measuring 39 mm in diameter, unchanged compared to the 12/2016 examination. Note, comparison to chest CT performed 2007 is  degraded secondary to significant pulsation artifact on that remote examination. Aortic aneurysm NOS (ICD10-I71.9). 2.  Aortic Atherosclerosis (ICD10-I70.0). 3.  Emphysema (ICD10-J43.9). 4. Right apical pleuroparenchymal thickening/scar is unchanged to 2007 examination. 5. Small hiatal hernia. 6. Stable sequela of prior splenectomy with residual splenule within the left upper abdomen.    EKG:  EKG last performed on 08/08/2020 reveals left axis deviation and normal sinus rhythm.  Recent Labs: 07/24/2020: BUN 15; Creatinine 1.3; Potassium 4.7; Sodium 138 08/08/2020: ALT 14; Hemoglobin 13.1; Platelets 198.0; Pro B Natriuretic peptide (BNP) 213.0; TSH 2.64  Recent Lipid Panel    Component Value Date/Time   CHOL 143 11/14/2019 1537   CHOL 141 12/02/2017 1124   TRIG 55 11/14/2019 1537   HDL 61 11/14/2019 1537   HDL 53 12/02/2017 1124   CHOLHDL 2.3 11/14/2019 1537   VLDL 10.4 02/03/2018 1343   LDLCALC 68 11/14/2019 1537   LDLDIRECT 321.8 02/27/2012 1202    Physical Exam:    VS:  BP 110/70   Pulse (!) 59   Ht 5\' 7"  (1.702 m)   Wt 269 lb (122 kg)   SpO2 90%   BMI 42.13 kg/m     Wt Readings from Last 3 Encounters:  01/09/21 269 lb (122 kg)  11/12/20 273 lb 3.2 oz  (123.9 kg)  10/29/20 250 lb (113.4 kg)     GEN:  morbid obesity.  BMI 42.. No acute distress HEENT: Normal NECK: No JVD. LYMPHATICS: No lymphadenopathy CARDIAC: Soft left lower sternal systolic MR murmur. RRR no gallop, or edema. VASCULAR:  Normal Pulses. No bruits. RESPIRATORY:  Clear to auscultation without rales, wheezing or rhonchi  ABDOMEN: Soft, non-tender, non-distended, No pulsatile mass, MUSCULOSKELETAL: No deformity  SKIN: Warm and dry NEUROLOGIC:  Alert and oriented x 3 PSYCHIATRIC:  Normal affect   ASSESSMENT:    1. S/P MVR (mitral valve repair)   2. Coronary artery disease involving coronary bypass graft of native heart with angina pectoris ([place])   3. Essential hypertension   4. Thoracic aortic aneurysm without rupture, unspecified part   5. Mixed hyperlipidemia   6. Atrial fibrillation, unspecified type ([place])   7. Systemic lupus erythematosus with focal and segmental proliferative glomerulonephritis ([place])    PLAN:    In order of problems listed above:  Unchanged auscultation.  Make sure we should do an echo. Secondary prevention discussed.  Special attention towards physical activity and weight loss. Excellent blood pressure on Toprol-XL 50 mg/day. Will need to have imaging follow-up next year.  There was less than 4 cm 2 years ago.  She has been on good antihypertensive regimen. Continue rosuvastatin.  Target LDL less than 70 No clinical recurrences. Managed by rheumatology.   Overall education and awareness concerning primary/secondary risk prevention was discussed in detail: LDL less than 70, hemoglobin A1c less than 7, blood pressure target less than 130/80 mmHg, >150 minutes of moderate aerobic activity per week, avoidance of smoking, weight control (via diet and exercise), and continued surveillance/management of/for obstructive sleep apnea.    Medication Adjustments/Labs and Tests Ordered: Current medicines are reviewed at length with the patient  today.  Concerns regarding medicines are outlined above.  No orders of the defined types were placed in this encounter.  No orders of the defined types were placed in this encounter.   Patient Instructions  Medication Instructions:  Your physician recommends that you continue on your current medications as directed. Please refer to the Current Medication list given to you today.  *If you  need a refill on your cardiac medications before your next appointment, please call your pharmacy*   Lab Work: None  If you have labs (blood work) drawn today and your tests are completely normal, you will receive your results only by: [place] (if you have MyChart) OR A paper copy in the mail If you have any lab test that is abnormal or we need to change your treatment, we will call you to review the results.   Testing/Procedures: None   Follow-Up: At [facility], you and your health needs are our priority.  As part of our continuing mission to provide you with exceptional heart care, we have created designated Provider Care Teams.  These Care Teams include your primary Cardiologist (physician) and Advanced Practice Providers (APPs -  Physician Assistants and Nurse Practitioners) who all work together to provide you with the care you need, when you need it.  We recommend signing up for the patient portal called "MyChart".  Sign up information is provided on this After Visit Summary.  MyChart is used to connect with patients for Virtual Visits (Telemedicine).  Patients are able to view lab/test results, encounter notes, upcoming appointments, etc.  Non-urgent messages can be sent to your provider as well.   To learn more about what you can do with MyChart, go to [email or web address].    Your next appointment:   1 year(s)  The format for your next appointment:   In Person  Provider:   You may see Sinclair Grooms, MD or one of the following Advanced Practice Providers on your  designated Care Team:   Cecilie Kicks, NP    Other Instructions Your physician recommends that you get at least 150 minutes a week of moderate aerobic activity.   Signed, Sinclair Grooms, MD  01/09/2021 4:40 PM    [facility]

## 2021-01-09 ENCOUNTER — Other Ambulatory Visit: Payer: Self-pay

## 2021-01-09 ENCOUNTER — Ambulatory Visit (INDEPENDENT_AMBULATORY_CARE_PROVIDER_SITE_OTHER): Payer: BC Managed Care – PPO | Admitting: Interventional Cardiology

## 2021-01-09 ENCOUNTER — Encounter: Payer: Self-pay | Admitting: Interventional Cardiology

## 2021-01-09 VITALS — BP 110/70 | HR 59 | Ht 67.0 in | Wt 269.0 lb

## 2021-01-09 DIAGNOSIS — I712 Thoracic aortic aneurysm, without rupture, unspecified: Secondary | ICD-10-CM | POA: Diagnosis not present

## 2021-01-09 DIAGNOSIS — I10 Essential (primary) hypertension: Secondary | ICD-10-CM | POA: Diagnosis not present

## 2021-01-09 DIAGNOSIS — M3214 Glomerular disease in systemic lupus erythematosus: Secondary | ICD-10-CM

## 2021-01-09 DIAGNOSIS — I4891 Unspecified atrial fibrillation: Secondary | ICD-10-CM | POA: Diagnosis not present

## 2021-01-09 DIAGNOSIS — E782 Mixed hyperlipidemia: Secondary | ICD-10-CM

## 2021-01-09 DIAGNOSIS — Z9889 Other specified postprocedural states: Secondary | ICD-10-CM | POA: Diagnosis not present

## 2021-01-09 DIAGNOSIS — I25709 Atherosclerosis of coronary artery bypass graft(s), unspecified, with unspecified angina pectoris: Secondary | ICD-10-CM | POA: Diagnosis not present

## 2021-01-09 NOTE — Patient Instructions (Signed)
Medication Instructions:  Your physician recommends that you continue on your current medications as directed. Please refer to the Current Medication list given to you today.  *If you need a refill on your cardiac medications before your next appointment, please call your pharmacy*   Lab Work: None  If you have labs (blood work) drawn today and your tests are completely normal, you will receive your results only by: [place] (if you have MyChart) OR A paper copy in the mail If you have any lab test that is abnormal or we need to change your treatment, we will call you to review the results.   Testing/Procedures: None   Follow-Up: At [facility], you and your health needs are our priority.  As part of our continuing mission to provide you with exceptional heart care, we have created designated Provider Care Teams.  These Care Teams include your primary Cardiologist (physician) and Advanced Practice Providers (APPs -  Physician Assistants and Nurse Practitioners) who all work together to provide you with the care you need, when you need it.  We recommend signing up for the patient portal called "MyChart".  Sign up information is provided on this After Visit Summary.  MyChart is used to connect with patients for Virtual Visits (Telemedicine).  Patients are able to view lab/test results, encounter notes, upcoming appointments, etc.  Non-urgent messages can be sent to your provider as well.   To learn more about what you can do with MyChart, go to [email or web address].    Your next appointment:   1 year(s)  The format for your next appointment:   In Person  Provider:   You may see Sinclair Grooms, MD or one of the following Advanced Practice Providers on your designated Care Team:   Cecilie Kicks, NP    Other Instructions Your physician recommends that you get at least 150 minutes a week of moderate aerobic activity.

## 2021-01-17 ENCOUNTER — Other Ambulatory Visit: Payer: Self-pay | Admitting: Internal Medicine

## 2021-01-22 ENCOUNTER — Ambulatory Visit
Admission: RE | Admit: 2021-01-22 | Discharge: 2021-01-22 | Disposition: A | Discharge status: Home / Self Care | Payer: BC Managed Care – PPO | Source: Ambulatory Visit | Attending: Internal Medicine | Admitting: Internal Medicine

## 2021-01-22 ENCOUNTER — Other Ambulatory Visit: Payer: Self-pay

## 2021-01-22 DIAGNOSIS — Z1231 Encounter for screening mammogram for malignant neoplasm of breast: Secondary | ICD-10-CM

## 2021-02-02 ENCOUNTER — Other Ambulatory Visit: Payer: Self-pay | Admitting: Internal Medicine

## 2021-02-02 DIAGNOSIS — E039 Hypothyroidism, unspecified: Secondary | ICD-10-CM

## 2021-02-04 ENCOUNTER — Other Ambulatory Visit: Payer: Self-pay | Admitting: Internal Medicine

## 2021-02-04 DIAGNOSIS — R600 Localized edema: Secondary | ICD-10-CM

## 2021-02-04 DIAGNOSIS — I10 Essential (primary) hypertension: Secondary | ICD-10-CM

## 2021-02-13 ENCOUNTER — Other Ambulatory Visit: Payer: Self-pay | Admitting: Interventional Cardiology

## 2021-02-22 ENCOUNTER — Other Ambulatory Visit: Payer: Self-pay

## 2021-02-22 ENCOUNTER — Ambulatory Visit (INDEPENDENT_AMBULATORY_CARE_PROVIDER_SITE_OTHER): Payer: BC Managed Care – PPO | Admitting: Nurse Practitioner

## 2021-02-22 VITALS — BP 138/86 | HR 57 | Temp 97.7°F | Resp 12 | Ht 67.0 in | Wt 270.5 lb

## 2021-02-22 DIAGNOSIS — J0140 Acute pansinusitis, unspecified: Secondary | ICD-10-CM | POA: Diagnosis not present

## 2021-02-22 DIAGNOSIS — R0602 Shortness of breath: Secondary | ICD-10-CM

## 2021-02-22 MED ORDER — AMOXICILLIN-POT CLAVULANATE 875-125 MG PO TABS: 1.0000 | ORAL_TABLET | Freq: Two times a day (BID) | ORAL | 0 refills | Status: DC

## 2021-02-22 MED ORDER — ALBUTEROL SULFATE HFA 108 (90 BASE) MCG/ACT IN AERS: 2.0000 | INHALATION_SPRAY | Freq: Four times a day (QID) | RESPIRATORY_TRACT | 0 refills | Status: AC | PRN

## 2021-02-22 NOTE — Assessment & Plan Note (Signed)
Patient experiencing shortness of breath since symptom onset.  No history of DVT or PE.  Wells score low likelihood of pulmonary embolus low.  Does have history of swelling we will check labs in office today.  Start her on albuterol inhaler.  Did offer to do chest x-ray patient would like to hold off, stating if she does not improve we can get 1.  Agree with plan and assessment continue to monitor

## 2021-02-22 NOTE — Assessment & Plan Note (Signed)
Given exam length of symptoms and that patient is on immune suppressing medications will elect to treat sinusitis.  Did discuss this with patient.  We will start patient on Augmentin 875-125 twice daily for 7 days.  Continue to monitor

## 2021-02-22 NOTE — Progress Notes (Signed)
Acute Office Visit  Subjective:    Patient ID: [name], female    DOB: [birth year], 65 y.o.   MRN: [id number]  Chief Complaint  Patient presents with   Shortness of Breath    And wheezing since 02/16/21, had bad headache and sinus congestion but not as bad now, had post nasal drip but its better now. No cough. Covid test negative on 11/28 and 02/21/21. Has been taking Sudafed and Tylenol.     Patient is in today for Shortness of breath   Symptoms started on 02/16/2021 Covid test on 28th and was negative and yesterday was negative Moderna x2 and 3 boosters No sick contacts  Is on immunosuppressing medications for lupus.  No history of DVT or PE.   Past Medical History:  Diagnosis Date   CAD (coronary artery disease)    a. remote stents/angioplasty. b. s/p CABG 01/2017 with mitral valve annuloplasty.   Esophageal reflux    GERD (gastroesophageal reflux disease)    Hypothyroidism    Immune thrombocytopenic purpura (HCC)    Lupus erythematosus    Mitral valve insufficiency and aortic valve insufficiency    Myocardial infarction (HCC)    x 2   PONV (postoperative nausea and vomiting)    Postoperative atrial fibrillation (HCC)    Severe mitral regurgitation    a. s/p MV annuloplasty 01/2017 at time of CABG.   Status post mitral valve annuloplasty    Unspecified disease of pericardium     Past Surgical History:  Procedure Laterality Date   CHOLECYSTECTOMY, LAPAROSCOPIC  2010   CORONARY ARTERY BYPASS GRAFT N/A 01/30/2017   Procedure: CORONARY ARTERY BYPASS GRAFTING (CABG), Times four  using the right saphaneous vein, harvested endoscopicly.  and left internal mammary artery .;  Surgeon: Gaye Pollack, MD;  Location: [facility];  Service: Open Heart Surgery;  Laterality: N/A;   LEFT HEART CATH AND CORONARY ANGIOGRAPHY N/A 01/23/2017   Procedure: LEFT HEART CATH AND CORONARY ANGIOGRAPHY;  Surgeon: Belva Crome, MD;  Location: [facility];  Service:  Cardiovascular;  Laterality: N/A;   MITRAL VALVE REPAIR N/A 01/30/2017   Procedure: MITRAL VALVE REPAIR (MVR);  Surgeon: Gaye Pollack, MD;  Location: [facility];  Service: Open Heart Surgery;  Laterality: N/A;   plastic surgical repair      dog bite to leg   SPLENECTOMY  5-04   TEE WITHOUT CARDIOVERSION N/A 01/27/2017   Procedure: TRANSESOPHAGEAL ECHOCARDIOGRAM (TEE);  Surgeon: Sanda Klein, MD;  Location: [facility];  Service: Cardiovascular;  Laterality: N/A;   TEE WITHOUT CARDIOVERSION N/A 01/30/2017   Procedure: TRANSESOPHAGEAL ECHOCARDIOGRAM (TEE);  Surgeon: Gaye Pollack, MD;  Location: [facility];  Service: Open Heart Surgery;  Laterality: N/A;   ULTRASOUND GUIDANCE FOR VASCULAR ACCESS  01/23/2017   Procedure: Ultrasound Guidance For Vascular Access;  Surgeon: Belva Crome, MD;  Location: [facility];  Service: Cardiovascular;;    Family History  Problem Relation Age of Onset   Cancer Mother        breast and cervical   Paget's disease of bone Mother    Fibromyalgia Mother    Breast cancer Mother    Hypertension Father    Heart disease Father        PVD   GER disease Father    Cancer Paternal Grandfather        colon   Heart attack Maternal Grandmother     Social History   Socioeconomic History   Marital  status: Single    Spouse name: Not on file   Number of children: Not on file   Years of education: Not on file   Highest education level: Not on file  Occupational History   Not on file  Tobacco Use   Smoking status: Former    Packs/day: 0.50    Years: 30.00    Pack years: 15.00    Types: Cigarettes    Quit date: 12/23/2011    Years since quitting: 9.1   Smokeless tobacco: Never  Vaping Use   Vaping Use: Former  Substance and Sexual Activity   Alcohol use: No   Drug use: No   Sexual activity: Not Currently    Comment: 1st intercourse 65 yo-More than 5 partners  Other Topics Concern   Not on file  Social History Narrative   HSG,  graduated from  [employer] in [place]. In college [name] -grad '13 with relgious studies. [employer] - Fall '13 for MA-divinity. Marrried '73 - 2 years/ divorced. 2 son- '73, '75 - CP.    Occupation: full-time Ship broker. She lives alone with her younger son living with her part-time but he resides in a managed care facility.    Social Determinants of Health   Financial Resource Strain: Low Risk    Difficulty of Paying Living Expenses: Not hard at all  Food Insecurity: No Food Insecurity   Worried About Charity fundraiser in the Last Year: Never true   [place] in the Last Year: Never true  Transportation Needs: No Transportation Needs   Lack of Transportation (Medical): No   Lack of Transportation (Non-Medical): No  Physical Activity: Not on file  Stress: No Stress Concern Present   Feeling of Stress : Not at all  Social Connections: Moderately Integrated   Frequency of Communication with Friends and Family: More than three times a week   Frequency of Social Gatherings with Friends and Family: More than three times a week   Attends Religious Services: More than 4 times per year   Active Member of [employer] or Organizations: Yes   Attends Music therapist: More than 4 times per year   Marital Status: Divorced  Human resources officer Violence: Not on file    Outpatient Medications Prior to Visit  Medication Sig Dispense Refill   azaTHIOprine (IMURAN) 50 MG tablet TAKE 1 TABLET BY MOUTH TWICE A DAY 180 tablet 0   cyclobenzaprine (FLEXERIL) 5 MG tablet Take 5 mg by mouth 3 (three) times daily as needed.     hydroxychloroquine (PLAQUENIL) 200 MG tablet Take 200 mg by mouth daily.      indapamide (LOZOL) 1.25 MG tablet TAKE 1 TABLET BY MOUTH EVERY DAY 90 tablet 0   levothyroxine (SYNTHROID) 75 MCG tablet TAKE 1 TABLET BY MOUTH EVERY DAY 90 tablet 0   metoprolol succinate (TOPROL-XL) 50 MG 24 hr tablet TAKE 1 TABLET (50 MG TOTAL) BY MOUTH DAILY. KEEP FOLLOW UP FOR FUTURE REFILLS. 90  tablet 3   pantoprazole (PROTONIX) 40 MG tablet TAKE 1 TABLET BY MOUTH EVERY DAY 90 tablet 3   rosuvastatin (CRESTOR) 20 MG tablet Take 1 tablet (20 mg total) by mouth daily. Please keep upcoming appt for future refills. 90 tablet 0   No facility-administered medications prior to visit.    Allergies  Allergen Reactions   Amlodipine Swelling    Leg edema   Cellcept [Mycophenolate] Nausea Only    Review of Systems  Constitutional:  Negative for  chills and fever.  HENT:  Positive for rhinorrhea, sinus pressure and sinus pain. Negative for congestion, ear discharge, ear pain (stuffy pressure) and sore throat.   Respiratory:  Positive for chest tightness, shortness of breath and wheezing. Negative for cough.   Cardiovascular:  Negative for chest pain.  Gastrointestinal:  Negative for abdominal pain, diarrhea, nausea and vomiting.  Musculoskeletal:  Negative for arthralgias and myalgias.  Neurological:  Positive for headaches.      Objective:    Physical Exam Vitals and nursing note reviewed.  Constitutional:      Appearance: She is obese.  HENT:     Right Ear: Tympanic membrane, ear canal and external ear normal. There is no impacted cerumen.     Left Ear: Tympanic membrane, ear canal and external ear normal. There is no impacted cerumen.     Nose:     Right Sinus: Maxillary sinus tenderness and frontal sinus tenderness present.     Left Sinus: Maxillary sinus tenderness and frontal sinus tenderness present.     Mouth/Throat:     Mouth: Mucous membranes are moist.     Pharynx: Oropharynx is clear.  Cardiovascular:     Rate and Rhythm: Normal rate and regular rhythm.  Pulmonary:     Effort: Pulmonary effort is normal.     Breath sounds: Decreased breath sounds present.     Comments: Decreased through out Abdominal:     General: Bowel sounds are normal.  Lymphadenopathy:     Cervical: No cervical adenopathy.  Skin:    General: Skin is warm.  Neurological:     Mental  Status: She is alert.  Psychiatric:        Mood and Affect: Mood normal.        Behavior: Behavior normal.        Thought Content: Thought content normal.        Judgment: Judgment normal.    BP 138/86   Pulse (!) 57   Temp 97.7 F (36.5 C)   Resp 12   Ht 5\' 7"  (1.702 m)   Wt 270 lb 8 oz (122.7 kg)   SpO2 99%   BMI 42.37 kg/m  Wt Readings from Last 3 Encounters:  02/22/21 270 lb 8 oz (122.7 kg)  01/09/21 269 lb (122 kg)  11/12/20 273 lb 3.2 oz (123.9 kg)    Health Maintenance Due  Topic Date Due   Zoster Vaccines- Shingrix (1 of 2) Never done   Pneumonia Vaccine 9+ Years old (3 - PCV) 10/25/2019   COVID-19 Vaccine (5 - Booster for Moderna series) 05/26/2020   PAP SMEAR-Modifier  06/16/2020   COLONOSCOPY (Pts 45-42yrs Insurance coverage will need to be confirmed)  09/19/2020   INFLUENZA VACCINE  10/22/2020    There are no preventive care reminders to display for this patient.   Lab Results  Component Value Date   TSH 2.64 08/08/2020   Lab Results  Component Value Date   WBC 3.6 (L) 08/08/2020   HGB 13.1 08/08/2020   HCT 38.9 08/08/2020   MCV 100.3 (H) 08/08/2020   PLT 198.0 08/08/2020   Lab Results  Component Value Date   NA 138 07/24/2020   K 4.7 07/24/2020   CO2 23 (A) 07/24/2020   GLUCOSE 88 08/04/2018   BUN 15 07/24/2020   CREATININE 1.3 (A) 07/24/2020   BILITOT 0.4 08/08/2020   ALKPHOS 85 08/08/2020   AST 21 08/08/2020   ALT 14 08/08/2020   PROT 6.9 08/08/2020   ALBUMIN  3.7 08/08/2020   CALCIUM 9.0 07/24/2020   ANIONGAP 5 02/01/2018   GFR 72.24 08/31/2014   Lab Results  Component Value Date   CHOL 143 11/14/2019   Lab Results  Component Value Date   HDL 61 11/14/2019   Lab Results  Component Value Date   LDLCALC 68 11/14/2019   Lab Results  Component Value Date   TRIG 55 11/14/2019   Lab Results  Component Value Date   CHOLHDL 2.3 11/14/2019   Lab Results  Component Value Date   HGBA1C 5.6 01/31/2017       Assessment &  Plan:   Problem List Items Addressed This Visit       Respiratory   Acute non-recurrent pansinusitis - Primary    Given exam length of symptoms and that patient is on immune suppressing medications will elect to treat sinusitis.  Did discuss this with patient.  We will start patient on Augmentin 875-125 twice daily for 7 days.  Continue to monitor      Relevant Medications   amoxicillin-clavulanate (AUGMENTIN) 875-125 MG tablet     Other   Shortness of breath    Patient experiencing shortness of breath since symptom onset.  No history of DVT or PE.  Wells score low likelihood of pulmonary embolus low.  Does have history of swelling we will check labs in office today.  Start her on albuterol inhaler.  Did offer to do chest x-ray patient would like to hold off, stating if she does not improve we can get 1.  Agree with plan and assessment continue to monitor      Relevant Medications   albuterol (VENTOLIN HFA) 108 (90 Base) MCG/ACT inhaler   Other Relevant Orders   CBC   COMPLETE METABOLIC PANEL WITH GFR   Brain natriuretic peptide     No orders of the defined types were placed in this encounter.  This visit occurred during the SARS-CoV-2 public health emergency.  Safety protocols were in place, including screening questions prior to the visit, additional usage of staff PPE, and extensive cleaning of exam room while observing appropriate contact time as indicated for disinfecting solutions.   Romilda Garret, NP

## 2021-02-22 NOTE — Patient Instructions (Signed)
Nice to see you today Follow up if symptoms fail to improve

## 2021-02-22 NOTE — Telephone Encounter (Signed)
Patient is requesting refill on her Cyclobenzaprine medication please.

## 2021-02-23 LAB — CBC
HCT: 37.8 % (ref 35.0–45.0)
Hemoglobin: 12.7 g/dL (ref 11.7–15.5)
MCH: 33.5 pg — ABNORMAL HIGH (ref 27.0–33.0)
MCHC: 33.6 g/dL (ref 32.0–36.0)
MCV: 99.7 fL (ref 80.0–100.0)
MPV: 14.9 fL — ABNORMAL HIGH (ref 7.5–12.5)
Platelets: 156 10*3/uL (ref 140–400)
RBC: 3.79 10*6/uL — ABNORMAL LOW (ref 3.80–5.10)
RDW: 13.4 % (ref 11.0–15.0)
WBC: 3 10*3/uL — ABNORMAL LOW (ref 3.8–10.8)

## 2021-02-23 LAB — COMPLETE METABOLIC PANEL WITH GFR
AG Ratio: 1.2 (calc) (ref 1.0–2.5)
ALT: 8 U/L (ref 6–29)
AST: 18 U/L (ref 10–35)
Albumin: 3.5 g/dL — ABNORMAL LOW (ref 3.6–5.1)
Alkaline phosphatase (APISO): 71 U/L (ref 37–153)
BUN/Creatinine Ratio: 9 (calc) (ref 6–22)
BUN: 10 mg/dL (ref 7–25)
CO2: 25 mmol/L (ref 20–32)
Calcium: 9.5 mg/dL (ref 8.6–10.4)
Chloride: 104 mmol/L (ref 98–110)
Creat: 1.17 mg/dL — ABNORMAL HIGH (ref 0.50–1.05)
Globulin: 3 g/dL (calc) (ref 1.9–3.7)
Glucose, Bld: 84 mg/dL (ref 65–99)
Potassium: 4.8 mmol/L (ref 3.5–5.3)
Sodium: 139 mmol/L (ref 135–146)
Total Bilirubin: 0.6 mg/dL (ref 0.2–1.2)
Total Protein: 6.5 g/dL (ref 6.1–8.1)
eGFR: 52 mL/min/{1.73_m2} — ABNORMAL LOW (ref >=60)

## 2021-02-23 LAB — BRAIN NATRIURETIC PEPTIDE: Brain Natriuretic Peptide: 578 pg/mL — ABNORMAL HIGH (ref <100)

## 2021-02-24 ENCOUNTER — Encounter (HOSPITAL_COMMUNITY): Payer: Self-pay | Admitting: Emergency Medicine

## 2021-02-24 ENCOUNTER — Inpatient Hospital Stay (HOSPITAL_COMMUNITY)
Admission: EM | Admit: 2021-02-24 | Discharge: 2021-02-27 | DRG: 286 | Disposition: A | Discharge status: Home / Self Care | Payer: BC Managed Care – PPO | Attending: Internal Medicine | Admitting: Internal Medicine

## 2021-02-24 ENCOUNTER — Emergency Department (HOSPITAL_COMMUNITY): Payer: BC Managed Care – PPO

## 2021-02-24 ENCOUNTER — Other Ambulatory Visit: Payer: Self-pay

## 2021-02-24 DIAGNOSIS — E039 Hypothyroidism, unspecified: Secondary | ICD-10-CM | POA: Diagnosis present

## 2021-02-24 DIAGNOSIS — I480 Paroxysmal atrial fibrillation: Secondary | ICD-10-CM | POA: Diagnosis present

## 2021-02-24 DIAGNOSIS — B9689 Other specified bacterial agents as the cause of diseases classified elsewhere: Secondary | ICD-10-CM | POA: Diagnosis present

## 2021-02-24 DIAGNOSIS — I2510 Atherosclerotic heart disease of native coronary artery without angina pectoris: Secondary | ICD-10-CM | POA: Diagnosis not present

## 2021-02-24 DIAGNOSIS — I252 Old myocardial infarction: Secondary | ICD-10-CM

## 2021-02-24 DIAGNOSIS — Z9889 Other specified postprocedural states: Secondary | ICD-10-CM

## 2021-02-24 DIAGNOSIS — K219 Gastro-esophageal reflux disease without esophagitis: Secondary | ICD-10-CM | POA: Diagnosis not present

## 2021-02-24 DIAGNOSIS — Z951 Presence of aortocoronary bypass graft: Secondary | ICD-10-CM | POA: Diagnosis not present

## 2021-02-24 DIAGNOSIS — I739 Peripheral vascular disease, unspecified: Secondary | ICD-10-CM | POA: Diagnosis present

## 2021-02-24 DIAGNOSIS — J019 Acute sinusitis, unspecified: Secondary | ICD-10-CM | POA: Diagnosis present

## 2021-02-24 DIAGNOSIS — M329 Systemic lupus erythematosus, unspecified: Secondary | ICD-10-CM | POA: Diagnosis present

## 2021-02-24 DIAGNOSIS — I5043 Acute on chronic combined systolic (congestive) and diastolic (congestive) heart failure: Secondary | ICD-10-CM | POA: Diagnosis present

## 2021-02-24 DIAGNOSIS — E782 Mixed hyperlipidemia: Secondary | ICD-10-CM | POA: Diagnosis not present

## 2021-02-24 DIAGNOSIS — R0789 Other chest pain: Secondary | ICD-10-CM | POA: Diagnosis not present

## 2021-02-24 DIAGNOSIS — D693 Immune thrombocytopenic purpura: Secondary | ICD-10-CM | POA: Diagnosis present

## 2021-02-24 DIAGNOSIS — Z87891 Personal history of nicotine dependence: Secondary | ICD-10-CM | POA: Diagnosis not present

## 2021-02-24 DIAGNOSIS — Z8249 Family history of ischemic heart disease and other diseases of the circulatory system: Secondary | ICD-10-CM | POA: Diagnosis not present

## 2021-02-24 DIAGNOSIS — I25709 Atherosclerosis of coronary artery bypass graft(s), unspecified, with unspecified angina pectoris: Principal | ICD-10-CM | POA: Diagnosis present

## 2021-02-24 DIAGNOSIS — R079 Chest pain, unspecified: Secondary | ICD-10-CM | POA: Diagnosis not present

## 2021-02-24 DIAGNOSIS — I340 Nonrheumatic mitral (valve) insufficiency: Secondary | ICD-10-CM | POA: Diagnosis present

## 2021-02-24 DIAGNOSIS — I2582 Chronic total occlusion of coronary artery: Secondary | ICD-10-CM | POA: Diagnosis present

## 2021-02-24 DIAGNOSIS — K449 Diaphragmatic hernia without obstruction or gangrene: Secondary | ICD-10-CM | POA: Diagnosis not present

## 2021-02-24 DIAGNOSIS — Z9081 Acquired absence of spleen: Secondary | ICD-10-CM

## 2021-02-24 DIAGNOSIS — N1831 Chronic kidney disease, stage 3a: Secondary | ICD-10-CM | POA: Diagnosis present

## 2021-02-24 DIAGNOSIS — R0602 Shortness of breath: Secondary | ICD-10-CM | POA: Diagnosis present

## 2021-02-24 DIAGNOSIS — Z20822 Contact with and (suspected) exposure to covid-19: Secondary | ICD-10-CM | POA: Diagnosis present

## 2021-02-24 DIAGNOSIS — Z888 Allergy status to other drugs, medicaments and biological substances status: Secondary | ICD-10-CM

## 2021-02-24 DIAGNOSIS — Z952 Presence of prosthetic heart valve: Secondary | ICD-10-CM | POA: Diagnosis not present

## 2021-02-24 DIAGNOSIS — Z6841 Body Mass Index (BMI) 40.0 and over, adult: Secondary | ICD-10-CM | POA: Diagnosis not present

## 2021-02-24 DIAGNOSIS — Z955 Presence of coronary angioplasty implant and graft: Secondary | ICD-10-CM

## 2021-02-24 DIAGNOSIS — R7989 Other specified abnormal findings of blood chemistry: Secondary | ICD-10-CM | POA: Diagnosis not present

## 2021-02-24 DIAGNOSIS — Z79899 Other long term (current) drug therapy: Secondary | ICD-10-CM

## 2021-02-24 DIAGNOSIS — I130 Hypertensive heart and chronic kidney disease with heart failure and stage 1 through stage 4 chronic kidney disease, or unspecified chronic kidney disease: Secondary | ICD-10-CM | POA: Diagnosis present

## 2021-02-24 DIAGNOSIS — M549 Dorsalgia, unspecified: Secondary | ICD-10-CM

## 2021-02-24 DIAGNOSIS — Z79890 Hormone replacement therapy (postmenopausal): Secondary | ICD-10-CM

## 2021-02-24 LAB — CBC
HCT: 39 % (ref 36.0–46.0)
Hemoglobin: 12.8 g/dL (ref 12.0–15.0)
MCH: 33.7 pg (ref 26.0–34.0)
MCHC: 32.8 g/dL (ref 30.0–36.0)
MCV: 102.6 fL — ABNORMAL HIGH (ref 80.0–100.0)
Platelets: 168 10*3/uL (ref 150–400)
RBC: 3.8 MIL/uL — ABNORMAL LOW (ref 3.87–5.11)
RDW: 15.9 % — ABNORMAL HIGH (ref 11.5–15.5)
WBC: 3.6 10*3/uL — ABNORMAL LOW (ref 4.0–10.5)
nRBC: 0 % (ref 0.0–0.2)

## 2021-02-24 LAB — BASIC METABOLIC PANEL
Anion gap: 9 (ref 5–15)
BUN: 11 mg/dL (ref 8–23)
CO2: 27 mmol/L (ref 22–32)
Calcium: 9 mg/dL (ref 8.9–10.3)
Chloride: 104 mmol/L (ref 98–111)
Creatinine, Ser: 1.22 mg/dL — ABNORMAL HIGH (ref 0.44–1.00)
GFR, Estimated: 49 mL/min — ABNORMAL LOW (ref >60)
Glucose, Bld: 102 mg/dL — ABNORMAL HIGH (ref 70–99)
Potassium: 3.8 mmol/L (ref 3.5–5.1)
Sodium: 140 mmol/L (ref 135–145)

## 2021-02-24 LAB — TROPONIN I (HIGH SENSITIVITY): Troponin I (High Sensitivity): 10 ng/L (ref <18)

## 2021-02-24 MED ORDER — HYDROMORPHONE HCL 1 MG/ML IJ SOLN
0.5000 mg | Freq: Once | INTRAMUSCULAR | Status: AC
  Administered 2021-02-25: 0.5 mg via INTRAVENOUS
  Filled 2021-02-24: qty 1

## 2021-02-24 NOTE — ED Provider Notes (Signed)
Emergency Medicine Provider Triage Evaluation Note  [name] , a 65 y.o. female  was evaluated in triage.  Pt complains of chest pain.  She has had intermittent chest pain over the past week.  Today the chest pain started radiating down her right arm.  Is located mostly in the center of her chest.  She denies any nausea or vomiting.  She has had associated worsening shortness of breath over the past week as well.  She has no leg swelling.  She does have a cardiac history with triple bypass.  She has had 2 stents placed in the past..  Review of Systems  Positive: See above Negative:   Physical Exam  BP (!) 149/83 (BP Location: Right Arm)   Pulse (!) 58   Temp 98.4 F (36.9 C) (Oral)   Resp 19   SpO2 100%  Gen:   Awake, no distress   Resp:  Normal effort  MSK:   Moves extremities without difficulty  Other:  No pitting edema of LE  Medical Decision Making  Medically screening exam initiated at 5:35 PM.  Appropriate orders placed.  [name] was informed that the remainder of the evaluation will be completed by another provider, this initial triage assessment does not replace that evaluation, and the importance of remaining in the ED until their evaluation is complete.     Sheila Oats 02/24/21 1736    Carmin Muskrat, MD 02/24/21 [phone]

## 2021-02-24 NOTE — ED Provider Notes (Signed)
[facility] EMERGENCY DEPARTMENT Provider Note   CSN: [id number] Arrival date & time: 02/24/21  1711     History Chief Complaint  Patient presents with   Chest Pain    [name] is a 65 y.o. female with a history of CAD status post CABG, status post mitral valve angioplasty, lupus erythematosus, GERD, hypothyroidism, ITP, and hyperlipidemia who presents to the emergency department with complaints of worsening chest & back pain x 1 week. Patient reports she is having tightness to the chest and an aching type pain to there right shoulder/back that radiates into the RUE. Has been having somewhat similar much less severe pain for past few weeks to months intermittently, however significantly progressed this week and has become constant. Worse with exertional activities, alleviated some with tylenol. Has also developed dyspnea, sinus pressure/pain, and wheezing. Seen by PCP office 02/22/21- started on Augmentin for acute sinusitis and given an inhaler to help with dyspnea, checked labs including BNP at that time. She has been using the inhaler with some mild relief of wheezing, otherwise feel her pain/dyspnea are worsening. She denies fever, nausea, vomiting, syncope, abdominal pain, acute leg pain/swelling, hemoptysis, recent surgery/trauma, recent long travel, hormone use, personal hx of cancer, or hx of DVT/PE (however does have documented DVT in EMR)   HPI     Past Medical History:  Diagnosis Date   CAD (coronary artery disease)    a. remote stents/angioplasty. b. s/p CABG 01/2017 with mitral valve annuloplasty.   Esophageal reflux    GERD (gastroesophageal reflux disease)    Hypothyroidism    Immune thrombocytopenic purpura (HCC)    Lupus erythematosus    Mitral valve insufficiency and aortic valve insufficiency    Myocardial infarction (HCC)    x 2   PONV (postoperative nausea and vomiting)    Postoperative atrial fibrillation (HCC)    Severe mitral  regurgitation    a. s/p MV annuloplasty 01/2017 at time of CABG.   Status post mitral valve annuloplasty    Unspecified disease of pericardium     Patient Active Problem List   Diagnosis Date Noted   Weight gain 10/12/2020   Hypertension 08/08/2020   Bilateral leg edema 08/08/2020   Chronic idiopathic constipation 05/31/2020   Hiatal hernia 05/31/2020   Morbid obesity ([place]) 05/31/2020   Cervical cancer screening 05/27/2017   Visit for screening mammogram 05/27/2017   Routine general medical examination at a health care facility 02/18/2017   Postoperative atrial fibrillation ([place]) 02/18/2017   S/P CABG x 4 01/30/2017   S/P MVR (mitral valve repair) 01/30/2017   Osteoarthritis of spine with radiculopathy, lumbar region 06/24/2016   Acute non-recurrent pansinusitis 08/23/2014   Shortness of breath 08/19/2011   Hypothyroidism 04/08/2011   Coronary artery disease involving coronary bypass graft of native heart with angina pectoris ([place]) 02/26/2009   Hyperlipidemia with target LDL less than 70 01/26/2009   Immune thrombocytopenic purpura ([place]) 08/05/2007   DVT 08/05/2007   GERD 08/05/2007   Systemic lupus erythematosus with focal and segmental proliferative glomerulonephritis ([place]) 08/05/2007    Past Surgical History:  Procedure Laterality Date   CHOLECYSTECTOMY, LAPAROSCOPIC  2010   CORONARY ARTERY BYPASS GRAFT N/A 01/30/2017   Procedure: CORONARY ARTERY BYPASS GRAFTING (CABG), Times four  using the right saphaneous vein, harvested endoscopicly.  and left internal mammary artery .;  Surgeon: Gaye Pollack, MD;  Location: [facility] OR;  Service: Open Heart Surgery;  Laterality: N/A;   LEFT HEART CATH AND CORONARY ANGIOGRAPHY  N/A 01/23/2017   Procedure: LEFT HEART CATH AND CORONARY ANGIOGRAPHY;  Surgeon: Belva Crome, MD;  Location: [facility];  Service: Cardiovascular;  Laterality: N/A;   MITRAL VALVE REPAIR N/A 01/30/2017   Procedure: MITRAL VALVE REPAIR (MVR);  Surgeon: Gaye Pollack, MD;  Location: [facility];  Service: Open Heart Surgery;  Laterality: N/A;   plastic surgical repair      dog bite to leg   SPLENECTOMY  5-04   TEE WITHOUT CARDIOVERSION N/A 01/27/2017   Procedure: TRANSESOPHAGEAL ECHOCARDIOGRAM (TEE);  Surgeon: Sanda Klein, MD;  Location: [facility] END[facility]COPY;  Service: Cardiovascular;  Laterality: N/A;   TEE WITHOUT CARDIOVERSION N/A 01/30/2017   Procedure: TRANSESOPHAGEAL ECHOCARDIOGRAM (TEE);  Surgeon: Gaye Pollack, MD;  Location: [facility];  Service: Open Heart Surgery;  Laterality: N/A;   ULTRASOUND GUIDANCE FOR VASCULAR ACCESS  01/23/2017   Procedure: Ultrasound Guidance For Vascular Access;  Surgeon: Belva Crome, MD;  Location: [facility];  Service: Cardiovascular;;     OB History     Gravida  2   Para  2   Term      Preterm      AB      Living  2      SAB      IAB      Ectopic      Multiple      Live Births              Family History  Problem Relation Age of Onset   Cancer Mother        breast and cervical   Paget's disease of bone Mother    Fibromyalgia Mother    Breast cancer Mother    Hypertension Father    Heart disease Father        PVD   GER disease Father    Cancer Paternal Grandfather        colon   Heart attack Maternal Grandmother     Social History   Tobacco Use   Smoking status: Former    Packs/day: 0.50    Years: 30.00    Pack years: 15.00    Types: Cigarettes    Quit date: 12/23/2011    Years since quitting: 9.1   Smokeless tobacco: Never  Vaping Use   Vaping Use: Former  Substance Use Topics   Alcohol use: No   Drug use: No    Home Medications Prior to Admission medications   Medication Sig Start Date End Date Taking? Authorizing Provider  albuterol (VENTOLIN HFA) 108 (90 Base) MCG/ACT inhaler Inhale 2 puffs into the lungs every 6 (six) hours as needed for wheezing or shortness of breath. 02/22/21   Michela Pitcher, NP  amoxicillin-clavulanate (AUGMENTIN) 875-125 MG tablet  Take 1 tablet by mouth 2 (two) times daily. 02/22/21   Michela Pitcher, NP  azaTHIOprine (IMURAN) 50 MG tablet TAKE 1 TABLET BY MOUTH TWICE A DAY 01/17/21   Janith Lima, MD  cyclobenzaprine (FLEXERIL) 5 MG tablet Take 5 mg by mouth 3 (three) times daily as needed. 03/20/20   [provider]  hydroxychloroquine (PLAQUENIL) 200 MG tablet Take 200 mg by mouth daily.     [provider]  indapamide (LOZOL) 1.25 MG tablet TAKE 1 TABLET BY MOUTH EVERY DAY 02/04/21   Janith Lima, MD  levothyroxine (SYNTHROID) 75 MCG tablet TAKE 1 TABLET BY MOUTH EVERY DAY 02/03/21   Janith Lima, MD  metoprolol succinate (  TOPROL-XL) 50 MG 24 hr tablet TAKE 1 TABLET (50 MG TOTAL) BY MOUTH DAILY. KEEP FOLLOW UP FOR FUTURE REFILLS. 02/13/21   Belva Crome, MD  pantoprazole (PROTONIX) 40 MG tablet TAKE 1 TABLET BY MOUTH EVERY DAY 10/22/20   Belva Crome, MD  rosuvastatin (CRESTOR) 20 MG tablet Take 1 tablet (20 mg total) by mouth daily. Please keep upcoming appt for future refills. 12/18/20   Belva Crome, MD    Allergies    Amlodipine and Cellcept [mycophenolate]  Review of Systems   Review of Systems  Constitutional:  Negative for chills and fever.  HENT:  Positive for sinus pressure.   Respiratory:  Positive for chest tightness, shortness of breath and wheezing. Negative for cough.   Cardiovascular:  Positive for chest pain.  Gastrointestinal:  Negative for abdominal pain, nausea and vomiting.  Musculoskeletal:  Positive for arthralgias and back pain.  Neurological:  Negative for dizziness and syncope.  All other systems reviewed and are negative.  Physical Exam Updated Vital Signs BP 130/75 (BP Location: Left Arm)   Pulse (!) 57   Temp 98 F (36.7 C) (Oral)   Resp 18   SpO2 100%   Physical Exam Vitals and nursing note reviewed.  Constitutional:      General: She is not in acute distress.    Appearance: She is well-developed. She is not toxic-appearing.  HENT:     Head:  Normocephalic and atraumatic.  Eyes:     General:        Right eye: No discharge.        Left eye: No discharge.     Conjunctiva/sclera: Conjunctivae normal.  Cardiovascular:     Rate and Rhythm: Normal rate and regular rhythm.     Pulses:          Radial pulses are 2+ on the right side and 2+ on the left side.       Posterior tibial pulses are 2+ on the right side and 2+ on the left side.  Pulmonary:     Effort: Tachypnea (mild) present. No respiratory distress.     Breath sounds: Normal breath sounds. No wheezing, rhonchi or rales.  Chest:     Chest wall: Tenderness (right posterior chest wall - thoracic paraspinal muscles. No overlying skin changes/rashes) present.  Abdominal:     General: There is no distension.     Palpations: Abdomen is soft.     Tenderness: There is no abdominal tenderness.  Musculoskeletal:     Cervical back: Neck supple.     Comments: Trace to 1+ edema to bilateral lower legs, no calf tenderness.   Skin:    General: Skin is warm and dry.     Findings: No rash.  Neurological:     Mental Status: She is alert.     Comments: Clear speech.   Psychiatric:        Behavior: Behavior normal.    ED Results / Procedures / Treatments   Labs (all labs ordered are listed, but only abnormal results are displayed) Labs Reviewed  BASIC METABOLIC PANEL - Abnormal; Notable for the following components:      Result Value   Glucose, Bld 102 (*)    Creatinine, Ser 1.22 (*)    GFR, Estimated 49 (*)    All other components within normal limits  CBC - Abnormal; Notable for the following components:   WBC 3.6 (*)    RBC 3.80 (*)    MCV 102.6 (*)  RDW 15.9 (*)    All other components within normal limits  TROPONIN I (HIGH SENSITIVITY)  TROPONIN I (HIGH SENSITIVITY)    EKG EKG Interpretation  Date/Time:  Sunday February 24 2021 23:29:52 EST Ventricular Rate:  59 PR Interval:  168 QRS Duration: 119 QT Interval:  465 QTC Calculation: 461 R Axis:   57 Text  Interpretation: Sinus rhythm Nonspecific intraventricular conduction delay Interpretation limited secondary to artifact Confirmed by Ripley Fraise [phone]) on 02/24/2021 11:32:39 PM  Radiology DG Chest 2 View  Result Date: 02/24/2021 CLINICAL DATA:  Chest pain radiating to the back and arms with shortness of breath. EXAM: CHEST - 2 VIEW COMPARISON:  February 01, 2018 FINDINGS: Prior median sternotomy, CABG and mitral valve replacement. The heart size and mediastinal contours are within normal limits. Both lungs are clear. Thoracic spondylosis. Surgical clips overlie the upper abdomen. IMPRESSION: No active cardiopulmonary disease. Electronically Signed   By: Dahlia Bailiff M.D.   On: 02/24/2021 19:41   CT Angio Chest PE W/Cm &/Or Wo Cm  Result Date: 02/25/2021 CLINICAL DATA:  Difficulty breathing and positive D-dimer, initial encounter EXAM: CT ANGIOGRAPHY CHEST WITH CONTRAST TECHNIQUE: Multidetector CT imaging of the chest was performed using the standard protocol during bolus administration of intravenous contrast. Multiplanar CT image reconstructions and MIPs were obtained to evaluate the vascular anatomy. CONTRAST:  27mL OMNIPAQUE IOHEXOL 350 MG/ML SOLN COMPARISON:  Chest x-ray from the previous day. FINDINGS: Cardiovascular: Atherosclerotic calcifications of the thoracic aorta are noted. Changes of prior coronary bypass grafting are noted. No aneurysmal dilatation of the aorta is seen. The degree of opacification is limited precluding evaluation for dissection. Heart is enlarged in size. Pulmonary artery shows a normal branching pattern bilaterally. No filling defect to suggest pulmonary embolism is noted. Coronary calcifications are noted. Mediastinum/Nodes: Thoracic inlet is within normal limits. No sizable hilar or mediastinal adenopathy is noted. The esophagus as visualized is within normal limits with the exception of a small sliding-type hiatal hernia. Lungs/Pleura: Areas of mosaic attenuation  are noted throughout both lungs consistent with air trapping. Minimal atelectatic changes in the right base are noted. No sizable effusion is seen. No pneumothorax is noted. Upper Abdomen: Visualized upper abdomen demonstrates changes of prior cholecystectomy. Musculoskeletal: No chest wall abnormality. No acute or significant osseous findings. Review of the MIP images confirms the above findings. IMPRESSION: No evidence of pulmonary emboli. Small sliding-type hiatal hernia. Changes consistent with air trapping.  No focal infiltrate is noted. Aortic Atherosclerosis (ICD10-I70.0). Electronically Signed   By: Inez Catalina M.D.   On: 02/25/2021 01:32    Procedures Procedures   Medications Ordered in ED Medications - No data to display  ED Course  I have reviewed the triage vital signs and the nursing notes.  Pertinent labs & imaging results that were available during my care of the patient were reviewed by me and considered in my medical decision making (see chart for details).    MDM Rules/Calculators/A&P                           Patient presents to the emergency department with chest pain, back pain, and dyspnea. Patient nontoxic appearing, mildly tachypneic on my assessment. Some pain with palpation of the right thoracic    DDX: ACS, pulmonary embolism, dissection, pneumothorax, pneumonia, arrhythmia, severe anemia, MSK, GERD, anxiety, pleurisy, pericarditis, CHF.   Additional history obtained:  Additional history obtained from chart review & nursing note review.  [id number]:[id number] rhythm Nonspecific intraventricular conduction delay Interpretation limited secondary to artifact   Lab Tests:  I Ordered, reviewed, and interpreted labs, which included:  CBC: mild leukopenia BMP: renal function similar to prior Troponin: WNL, flat D-dimer: elevated- CTA  Covid/flu: Negative  Imaging Studies ordered:  CXR done by triage, I independently reviewed, formal radiology impression shows: No active  cardiopulmonary disease. I additionally ordered CTA of the chest- No evidence of pulmonary emboli. Small sliding-type hiatal hernia. Changes consistent with air trapping.  No focal infiltrate is noted. Aortic Atherosclerosis  ED Course:  CTA w/o PE. No infiltrate. No pulmonary edema. COVID/flu negative. No wheezing on exam. Some reproducibility of pain with posterior chest wall palpation, however patient relaying some of her sxs feel exactly like prior MI, hx of CABG, trops flat today, no significant acute ischemia on EKG, discussed with Dr. Renella Cunas with cardiology- will see patient in consultation and admit to medicine. On reassessment patient states she feels much better, she does feel nauseated S/p dialudid- zofran ordered as well as aspirin.   03:25: CONSULT: Discussed with hospitalist Dr. Cyd Silence- accepts admission.   This is a shared visit with supervising physician Dr. Christy Gentles who has independently evaluated patient & provided guidance in evaluation/management/disposition, in agreement with care   Portions of this note were generated with Dragon dictation software. Dictation errors may occur despite best attempts at proofreading.   Final Clinical Impression(s) / ED Diagnoses Final diagnoses:  Chest pain, unspecified type    Rx / DC Orders ED Discharge Orders     None        Amaryllis Dyke, PA-C 02/25/21 0447    Ripley Fraise, MD 02/25/21 [phone]

## 2021-02-24 NOTE — ED Triage Notes (Signed)
C/o intermittent pain to center of chest that radiates to back with SOB x 1 week.  Denies nausea and vomiting.

## 2021-02-25 ENCOUNTER — Emergency Department (HOSPITAL_COMMUNITY): Payer: BC Managed Care – PPO

## 2021-02-25 ENCOUNTER — Observation Stay (HOSPITAL_COMMUNITY): Payer: BC Managed Care – PPO

## 2021-02-25 ENCOUNTER — Encounter (HOSPITAL_COMMUNITY): Payer: Self-pay | Admitting: Internal Medicine

## 2021-02-25 DIAGNOSIS — Z87891 Personal history of nicotine dependence: Secondary | ICD-10-CM | POA: Diagnosis not present

## 2021-02-25 DIAGNOSIS — I480 Paroxysmal atrial fibrillation: Secondary | ICD-10-CM | POA: Diagnosis present

## 2021-02-25 DIAGNOSIS — B9689 Other specified bacterial agents as the cause of diseases classified elsewhere: Secondary | ICD-10-CM | POA: Diagnosis not present

## 2021-02-25 DIAGNOSIS — K219 Gastro-esophageal reflux disease without esophagitis: Secondary | ICD-10-CM | POA: Diagnosis present

## 2021-02-25 DIAGNOSIS — R0602 Shortness of breath: Secondary | ICD-10-CM | POA: Diagnosis not present

## 2021-02-25 DIAGNOSIS — R0789 Other chest pain: Secondary | ICD-10-CM

## 2021-02-25 DIAGNOSIS — K449 Diaphragmatic hernia without obstruction or gangrene: Secondary | ICD-10-CM | POA: Diagnosis not present

## 2021-02-25 DIAGNOSIS — Z9081 Acquired absence of spleen: Secondary | ICD-10-CM | POA: Diagnosis not present

## 2021-02-25 DIAGNOSIS — J019 Acute sinusitis, unspecified: Secondary | ICD-10-CM

## 2021-02-25 DIAGNOSIS — I739 Peripheral vascular disease, unspecified: Secondary | ICD-10-CM | POA: Diagnosis present

## 2021-02-25 DIAGNOSIS — I2582 Chronic total occlusion of coronary artery: Secondary | ICD-10-CM | POA: Diagnosis present

## 2021-02-25 DIAGNOSIS — Z888 Allergy status to other drugs, medicaments and biological substances status: Secondary | ICD-10-CM | POA: Diagnosis not present

## 2021-02-25 DIAGNOSIS — I25709 Atherosclerosis of coronary artery bypass graft(s), unspecified, with unspecified angina pectoris: Secondary | ICD-10-CM | POA: Diagnosis present

## 2021-02-25 DIAGNOSIS — Z8249 Family history of ischemic heart disease and other diseases of the circulatory system: Secondary | ICD-10-CM | POA: Diagnosis not present

## 2021-02-25 DIAGNOSIS — R079 Chest pain, unspecified: Secondary | ICD-10-CM | POA: Diagnosis present

## 2021-02-25 DIAGNOSIS — E039 Hypothyroidism, unspecified: Secondary | ICD-10-CM

## 2021-02-25 DIAGNOSIS — Z951 Presence of aortocoronary bypass graft: Secondary | ICD-10-CM | POA: Diagnosis not present

## 2021-02-25 DIAGNOSIS — N1831 Chronic kidney disease, stage 3a: Secondary | ICD-10-CM | POA: Diagnosis present

## 2021-02-25 DIAGNOSIS — M329 Systemic lupus erythematosus, unspecified: Secondary | ICD-10-CM | POA: Diagnosis present

## 2021-02-25 DIAGNOSIS — I252 Old myocardial infarction: Secondary | ICD-10-CM | POA: Diagnosis not present

## 2021-02-25 DIAGNOSIS — I340 Nonrheumatic mitral (valve) insufficiency: Secondary | ICD-10-CM | POA: Diagnosis present

## 2021-02-25 DIAGNOSIS — I5043 Acute on chronic combined systolic (congestive) and diastolic (congestive) heart failure: Secondary | ICD-10-CM | POA: Diagnosis present

## 2021-02-25 DIAGNOSIS — Z6841 Body Mass Index (BMI) 40.0 and over, adult: Secondary | ICD-10-CM | POA: Diagnosis not present

## 2021-02-25 DIAGNOSIS — D693 Immune thrombocytopenic purpura: Secondary | ICD-10-CM | POA: Diagnosis present

## 2021-02-25 DIAGNOSIS — I2510 Atherosclerotic heart disease of native coronary artery without angina pectoris: Secondary | ICD-10-CM | POA: Diagnosis not present

## 2021-02-25 DIAGNOSIS — Z952 Presence of prosthetic heart valve: Secondary | ICD-10-CM | POA: Diagnosis not present

## 2021-02-25 DIAGNOSIS — E782 Mixed hyperlipidemia: Secondary | ICD-10-CM

## 2021-02-25 DIAGNOSIS — I130 Hypertensive heart and chronic kidney disease with heart failure and stage 1 through stage 4 chronic kidney disease, or unspecified chronic kidney disease: Secondary | ICD-10-CM | POA: Diagnosis present

## 2021-02-25 DIAGNOSIS — R7989 Other specified abnormal findings of blood chemistry: Secondary | ICD-10-CM | POA: Diagnosis not present

## 2021-02-25 DIAGNOSIS — Z20822 Contact with and (suspected) exposure to covid-19: Secondary | ICD-10-CM | POA: Diagnosis present

## 2021-02-25 LAB — HEMOGLOBIN A1C
Hgb A1c MFr Bld: 5.2 % (ref 4.8–5.6)
Mean Plasma Glucose: 102.54 mg/dL

## 2021-02-25 LAB — LIPID PANEL
Cholesterol: 110 mg/dL (ref 0–200)
HDL: 44 mg/dL (ref >40)
LDL Cholesterol: 59 mg/dL (ref 0–99)
Total CHOL/HDL Ratio: 2.5 RATIO
Triglycerides: 36 mg/dL (ref <150)
VLDL: 7 mg/dL (ref 0–40)

## 2021-02-25 LAB — ECHOCARDIOGRAM COMPLETE
Area-P 1/2: 1.6 cm2
Height: 67 in
MV M vel: 4.94 m/s
MV Peak grad: 97.6 mmHg
MV VTI: 1.44 cm2
S' Lateral: 4.1 cm
Weight: 4320 oz

## 2021-02-25 LAB — TROPONIN I (HIGH SENSITIVITY)
Troponin I (High Sensitivity): 11 ng/L (ref <18)
Troponin I (High Sensitivity): 9 ng/L (ref <18)

## 2021-02-25 LAB — RESP PANEL BY RT-PCR (FLU A&B, COVID) ARPGX2
Influenza A by PCR: NEGATIVE
Influenza B by PCR: NEGATIVE
SARS Coronavirus 2 by RT PCR: NEGATIVE

## 2021-02-25 LAB — HIV ANTIBODY (ROUTINE TESTING W REFLEX): HIV Screen 4th Generation wRfx: NONREACTIVE

## 2021-02-25 LAB — D-DIMER, QUANTITATIVE: D-Dimer, Quant: 1.27 ug/mL-FEU — ABNORMAL HIGH (ref 0.00–0.50)

## 2021-02-25 LAB — BRAIN NATRIURETIC PEPTIDE: B Natriuretic Peptide: 310.9 pg/mL — ABNORMAL HIGH (ref 0.0–100.0)

## 2021-02-25 LAB — MAGNESIUM: Magnesium: 2.1 mg/dL (ref 1.7–2.4)

## 2021-02-25 MED ORDER — AMOXICILLIN-POT CLAVULANATE 875-125 MG PO TABS
1.0000 | ORAL_TABLET | Freq: Two times a day (BID) | ORAL | Status: DC
  Administered 2021-02-25 – 2021-02-27 (×4): 1 via ORAL
  Filled 2021-02-25 (×5): qty 1

## 2021-02-25 MED ORDER — CYCLOBENZAPRINE HCL 5 MG PO TABS: 5.0000 mg | ORAL_TABLET | Freq: Three times a day (TID) | ORAL | Status: DC | PRN

## 2021-02-25 MED ORDER — FUROSEMIDE 10 MG/ML IJ SOLN
20.0000 mg | Freq: Once | INTRAMUSCULAR | Status: AC
  Administered 2021-02-25: 20 mg via INTRAVENOUS
  Filled 2021-02-25: qty 2

## 2021-02-25 MED ORDER — ONDANSETRON HCL 4 MG/2ML IJ SOLN
4.0000 mg | Freq: Once | INTRAMUSCULAR | Status: AC
  Administered 2021-02-25: 4 mg via INTRAVENOUS
  Filled 2021-02-25: qty 2

## 2021-02-25 MED ORDER — LEVOTHYROXINE SODIUM 75 MCG PO TABS
75.0000 ug | ORAL_TABLET | Freq: Every day | ORAL | Status: DC
  Administered 2021-02-25 – 2021-02-27 (×3): 75 ug via ORAL
  Filled 2021-02-25 (×3): qty 1

## 2021-02-25 MED ORDER — ACETAMINOPHEN 325 MG PO TABS
650.0000 mg | ORAL_TABLET | Freq: Four times a day (QID) | ORAL | Status: DC | PRN
  Administered 2021-02-25 – 2021-02-26 (×2): 650 mg via ORAL
  Filled 2021-02-25 (×2): qty 2

## 2021-02-25 MED ORDER — FUROSEMIDE 10 MG/ML IJ SOLN
20.0000 mg | Freq: Two times a day (BID) | INTRAMUSCULAR | Status: DC
  Administered 2021-02-25: 20 mg via INTRAVENOUS
  Filled 2021-02-25: qty 2

## 2021-02-25 MED ORDER — IOHEXOL 350 MG/ML SOLN
85.0000 mL | Freq: Once | INTRAVENOUS | Status: AC | PRN
  Administered 2021-02-25: 85 mL via INTRAVENOUS

## 2021-02-25 MED ORDER — FUROSEMIDE 10 MG/ML IJ SOLN
40.0000 mg | Freq: Two times a day (BID) | INTRAMUSCULAR | Status: DC
  Administered 2021-02-25 – 2021-02-27 (×4): 40 mg via INTRAVENOUS
  Filled 2021-02-25 (×4): qty 4

## 2021-02-25 MED ORDER — METOCLOPRAMIDE HCL 5 MG/ML IJ SOLN
10.0000 mg | Freq: Four times a day (QID) | INTRAMUSCULAR | Status: DC | PRN
  Administered 2021-02-25: 10 mg via INTRAVENOUS
  Filled 2021-02-25: qty 2

## 2021-02-25 MED ORDER — POLYETHYLENE GLYCOL 3350 17 G PO PACK: 17.0000 g | PACK | Freq: Every day | ORAL | Status: DC | PRN

## 2021-02-25 MED ORDER — ASPIRIN 325 MG PO TABS
325.0000 mg | ORAL_TABLET | Freq: Every day | ORAL | Status: DC
  Administered 2021-02-25: 13:00:00 325 mg via ORAL
  Filled 2021-02-25 (×2): qty 1

## 2021-02-25 MED ORDER — ONDANSETRON HCL 4 MG/2ML IJ SOLN
4.0000 mg | Freq: Four times a day (QID) | INTRAMUSCULAR | Status: DC | PRN
  Administered 2021-02-25: 09:00:00 4 mg via INTRAVENOUS
  Filled 2021-02-25: qty 2

## 2021-02-25 MED ORDER — NITROGLYCERIN 0.4 MG SL SUBL: 0.4000 mg | SUBLINGUAL_TABLET | SUBLINGUAL | Status: DC | PRN

## 2021-02-25 MED ORDER — ENOXAPARIN SODIUM 40 MG/0.4ML IJ SOSY
40.0000 mg | PREFILLED_SYRINGE | Freq: Every day | INTRAMUSCULAR | Status: DC
  Filled 2021-02-25 (×2): qty 0.4

## 2021-02-25 MED ORDER — ROSUVASTATIN CALCIUM 20 MG PO TABS
20.0000 mg | ORAL_TABLET | Freq: Every day | ORAL | Status: DC
  Administered 2021-02-25 – 2021-02-27 (×3): 20 mg via ORAL
  Filled 2021-02-25 (×4): qty 1

## 2021-02-25 MED ORDER — METOPROLOL SUCCINATE ER 50 MG PO TB24
50.0000 mg | ORAL_TABLET | Freq: Every day | ORAL | Status: DC
  Administered 2021-02-26 – 2021-02-27 (×2): 50 mg via ORAL
  Filled 2021-02-25: qty 1
  Filled 2021-02-25: qty 2
  Filled 2021-02-25: qty 1

## 2021-02-25 MED ORDER — NITROGLYCERIN 0.4 MG SL SUBL
0.4000 mg | SUBLINGUAL_TABLET | Freq: Once | SUBLINGUAL | Status: AC
  Administered 2021-02-25: 0.4 mg via SUBLINGUAL
  Filled 2021-02-25: qty 1

## 2021-02-25 MED ORDER — ONDANSETRON HCL 4 MG PO TABS: 4.0000 mg | ORAL_TABLET | Freq: Four times a day (QID) | ORAL | Status: DC | PRN

## 2021-02-25 MED ORDER — AZATHIOPRINE 50 MG PO TABS
50.0000 mg | ORAL_TABLET | Freq: Two times a day (BID) | ORAL | Status: DC
  Administered 2021-02-25 – 2021-02-27 (×5): 50 mg via ORAL
  Filled 2021-02-25 (×7): qty 1

## 2021-02-25 MED ORDER — ALBUTEROL SULFATE (2.5 MG/3ML) 0.083% IN NEBU: 2.5000 mg | INHALATION_SOLUTION | Freq: Four times a day (QID) | RESPIRATORY_TRACT | Status: DC | PRN

## 2021-02-25 MED ORDER — PANTOPRAZOLE SODIUM 40 MG PO TBEC
40.0000 mg | DELAYED_RELEASE_TABLET | Freq: Every day | ORAL | Status: DC
  Administered 2021-02-25 – 2021-02-27 (×3): 40 mg via ORAL
  Filled 2021-02-25 (×3): qty 1

## 2021-02-25 MED ORDER — HYDROXYCHLOROQUINE SULFATE 200 MG PO TABS
200.0000 mg | ORAL_TABLET | Freq: Every day | ORAL | Status: DC
  Administered 2021-02-25 – 2021-02-27 (×3): 200 mg via ORAL
  Filled 2021-02-25 (×3): qty 1

## 2021-02-25 MED ORDER — ASPIRIN 81 MG PO CHEW
324.0000 mg | CHEWABLE_TABLET | Freq: Once | ORAL | Status: AC
  Administered 2021-02-25: 324 mg via ORAL
  Filled 2021-02-25: qty 4

## 2021-02-25 MED ORDER — ACETAMINOPHEN 325 MG PO TABS
650.0000 mg | ORAL_TABLET | Freq: Once | ORAL | Status: AC
  Administered 2021-02-25: 650 mg via ORAL
  Filled 2021-02-25: qty 2

## 2021-02-25 MED ORDER — ACETAMINOPHEN 650 MG RE SUPP: 650.0000 mg | Freq: Four times a day (QID) | RECTAL | Status: DC | PRN

## 2021-02-25 NOTE — Assessment & Plan Note (Signed)
   Patient presented with episodic chest discomfort with both typical and atypical features in the setting of extensive cardiac history  EKG revealing no dynamic ST segment change and initial troponins are unremarkable all of which is reassuring  Continuing to cycle cardiac enzymes  Monitoring patient on telemetry  Patient is already been evaluated by cardiology, their input is appreciated.  They are recommending several days of diuretics for suspected mild cardiogenic volume overload.  They also recommending obtaining an echocardiogram with the possibility of pursuing cardiac catheterization later in the hospitalization.  Cardiology team to continue to follow later today  Currently providing patient with aspirin 325 mg daily, statin therapy  As needed nitroglycerin for further episodes of chest discomfort

## 2021-02-25 NOTE — ED Notes (Signed)
Breakfast orders placed 

## 2021-02-25 NOTE — ED Notes (Signed)
Patient transported to CT 

## 2021-02-25 NOTE — Assessment & Plan Note (Signed)
.   Resume home regimen of Synthroid 

## 2021-02-25 NOTE — Assessment & Plan Note (Addendum)
   Patient complaining of exertional dyspnea which could be contributed to early congestive heart failure  Patient saw outpatient provider on 12/2 who thought that symptoms may be attributable to acute bacterial sinusitis however patient has not improved with several days of Augmentin.  Notable elevated BNP on 12/2, BNP on this presentation still pending  Possible mild cardiogenic volume overload considering peripheral edema and elevated BNP  Echocardiogram ordered for later this morning  Cardiology recommending several days of intravenous diuretics and therefore patient is been placed on Lasix 20 mg IV every 12 hours  Strict input and output monitoring  Monitoring renal function and electrolytes with serial chemistries  Daily weights  Monitoring patient on telemetry

## 2021-02-25 NOTE — Progress Notes (Signed)
Echocardiogram 2D Echocardiogram has been performed.  Oneal Deputy Haidar Muse RDCS 02/25/2021, 2:53 PM

## 2021-02-25 NOTE — ED Notes (Signed)
Patient reports not wanting to take morning medication yet due to nausea and headache. Zofran given.

## 2021-02-25 NOTE — ED Notes (Signed)
Pt transported to echo 

## 2021-02-25 NOTE — Progress Notes (Signed)
Patient ID: [name], female   DOB: 06-03-1955, 65 y.o.   MRN: [id number] Patient admitted early this morning for chest pain and cardiology has been consulted.  Patient seen and examined at bedside and plan of care discussed with her.  I have reviewed patient's medical records including this morning's H&P, current vitals, medications and labs myself.  Follow further recommendations from cardiology.

## 2021-02-25 NOTE — ED Notes (Signed)
Linens changed and purewick in place.

## 2021-02-25 NOTE — Consult Note (Signed)
Cardiology Consultation:   Patient ID: [name] MRN: [id number]; DOB: 1955-12-20  Admit date: 02/24/2021 Date of Consult: 02/25/2021  Primary Care Provider: Janith Lima, MD [facility] Cardiologist: Sinclair Grooms, MD  [facility] Electrophysiologist:  None   Patient Profile:   [name] is a 65 y.o. female with CAD s/p PCI and 4v CABG (2018), MV annuloplasty, postop AF, PAD, AsAA, SLE, GERD, hypothyroidism, and ITP who is being seen today for the evaluation of chest/back pain at the request of [facility].   History of Present Illness:   [name] presented with intermittent chest pain with radiation to the back and associated shortness of breath x1 week duration.  She denied any nausea or emesis.  Today the chest pain started radiating down the right arm but is mostly located central chest.  She denies any lower extremity swelling.  She was having similar pain over the past few weeks but it was less severe and intermittent however this past week it has become progressively worse and more constant.  He was worse with exertional activities and alleviated with Tylenol.  She is also reported some dyspnea on exertion, wheezing, sinus pressure and pain.  She was seen by her primary care physician on December 2 and was prescribed Augmentin for acute sinusitis and given a inhaler to help with mild wheezing.  VS on initial evaluation P 58, BP 149/83, T 98.4, RR 19, SpO2 100% CTA without PE and COVID/flu negative.  She has some reproducible pain on chest wall palpation however was reporting that her symptoms felt similar to prior MI.  ECG with no acute ischemic changes.  During my evaluation patient is comfortable at rest but recently has received has had dilaudid.  VS on my evaluation  P 65, BP 121/65 (82), RR 15, O2 100% RA 03/2001 and 12/2001 had initial MI and current sx feel similar to that episode. Only difference between current sx and 2003 sx is the  pain level. Pain in 2003 was absolutely terrible (12/10). Before pain medications was 8-9/10 today. Now pain is not just in shoulder blades but going down back and R arm which is different than prior episodes. Had severe nausea in 2003 and kept vomiting, none currently and no diaphoresis like in 2003. Compared to 2018 she felt more jaw sx in 2018 (tightness jaw/face, almost felt "stroke like" sx with dizziness as well but no FNDs). Center of chest pain to shoulder blades, to R back and R arm. Pain started a few months ago, even back to October clinic visit. Thought maybe it was MSK and issue with rotator cuff as she sleeps on her R side. Sx have progressively worsened over the past few months. They were occurring both at rest and exertion. Was occurring daily even a few months ago but was not as severe (5-6/10 severity then). Usually episodes lasted until she took tylenol or flexeril and then they would improve. Meds alleviated sx but didn't stop it. Then pain became much more severe 3 weeks ago. Has been relatively chronic for few months prior. Having episodes now throughout the day, taking tylenol at least twice daily for this. Tylenol improves pain from 8/10 to 3-4/10. Constant pain that doesn't change with rest or exertion. SOB does however worsen with exertion. That started around 3 weeks ago as well when the pain increased. Also with wheezing 2 weeks ago. Wheezing was very severe that it would wake her at night. Doesn't have COPD. Inhalers recently  prescribed helped significantly with wheezing. As we are talking she has dull pain in shoulders and down R arm with some chest tightness but not pain. RLE swelling since bypass surgery and SVG harvest. Notices dependent edema but no new edema "out of the ordinary" edema. Hasn't tried to take any SLNG. Noticed issues getting up 1 flight of stairs at house, reports 50 lb unintentional wt gain but all over, no focal to LE. Also can't walk as far as local park over the  past few months. A lot of this has to do with back pain for which she recieves steroid injections but she says equally as important is progressive dyspnea.   Past Medical History:  Diagnosis Date   CAD (coronary artery disease)    a. remote stents/angioplasty. b. s/p CABG 01/2017 with mitral valve annuloplasty.   Esophageal reflux    GERD (gastroesophageal reflux disease)    Hypothyroidism    Immune thrombocytopenic purpura (HCC)    Lupus erythematosus    Mitral valve insufficiency and aortic valve insufficiency    Myocardial infarction (HCC)    x 2   PONV (postoperative nausea and vomiting)    Postoperative atrial fibrillation (HCC)    Severe mitral regurgitation    a. s/p MV annuloplasty 01/2017 at time of CABG.   Status post mitral valve annuloplasty    Unspecified disease of pericardium    Past Surgical History:  Procedure Laterality Date   CHOLECYSTECTOMY, LAPAROSCOPIC  2010   CORONARY ARTERY BYPASS GRAFT N/A 01/30/2017   Procedure: CORONARY ARTERY BYPASS GRAFTING (CABG), Times four  using the right saphaneous vein, harvested endoscopicly.  and left internal mammary artery .;  Surgeon: Gaye Pollack, MD;  Location: [facility] OR;  Service: Open Heart Surgery;  Laterality: N/A;   LEFT HEART CATH AND CORONARY ANGIOGRAPHY N/A 01/23/2017   Procedure: LEFT HEART CATH AND CORONARY ANGIOGRAPHY;  Surgeon: Belva Crome, MD;  Location: [place] CV LAB;  Service: Cardiovascular;  Laterality: N/A;   MITRAL VALVE REPAIR N/A 01/30/2017   Procedure: MITRAL VALVE REPAIR (MVR);  Surgeon: Gaye Pollack, MD;  Location: [place];  Service: Open Heart Surgery;  Laterality: N/A;   plastic surgical repair      dog bite to leg   SPLENECTOMY  5-04   TEE WITHOUT CARDIOVERSION N/A 01/27/2017   Procedure: TRANSESOPHAGEAL ECHOCARDIOGRAM (TEE);  Surgeon: Sanda Klein, MD;  Location: [facility] ENDOSCOPY;  Service: Cardiovascular;  Laterality: N/A;   TEE WITHOUT CARDIOVERSION N/A 01/30/2017   Procedure:  TRANSESOPHAGEAL ECHOCARDIOGRAM (TEE);  Surgeon: Gaye Pollack, MD;  Location: [place];  Service: Open Heart Surgery;  Laterality: N/A;   ULTRASOUND GUIDANCE FOR VASCULAR ACCESS  01/23/2017   Procedure: Ultrasound Guidance For Vascular Access;  Surgeon: Belva Crome, MD;  Location: [place] CV LAB;  Service: Cardiovascular;;    Home Medications:  Prior to Admission medications   Medication Sig Start Date End Date Taking? Authorizing Provider  albuterol (VENTOLIN HFA) 108 (90 Base) MCG/ACT inhaler Inhale 2 puffs into the lungs every 6 (six) hours as needed for wheezing or shortness of breath. 02/22/21   Michela Pitcher, NP  amoxicillin-clavulanate (AUGMENTIN) 875-125 MG tablet Take 1 tablet by mouth 2 (two) times daily. 02/22/21   Michela Pitcher, NP  azaTHIOprine (IMURAN) 50 MG tablet TAKE 1 TABLET BY MOUTH TWICE A DAY 01/17/21   Meghan Lima, MD  cyclobenzaprine (FLEXERIL) 5 MG tablet Take 5 mg by mouth 3 (three) times daily as needed. 03/20/20  [provider]  hydroxychloroquine (PLAQUENIL) 200 MG tablet Take 200 mg by mouth daily.     [provider]  indapamide (LOZOL) 1.25 MG tablet TAKE 1 TABLET BY MOUTH EVERY DAY 02/04/21   Meghan Lima, MD  levothyroxine (SYNTHROID) 75 MCG tablet TAKE 1 TABLET BY MOUTH EVERY DAY 02/03/21   Meghan Lima, MD  metoprolol succinate (TOPROL-XL) 50 MG 24 hr tablet TAKE 1 TABLET (50 MG TOTAL) BY MOUTH DAILY. KEEP FOLLOW UP FOR FUTURE REFILLS. 02/13/21   Belva Crome, MD  pantoprazole (PROTONIX) 40 MG tablet TAKE 1 TABLET BY MOUTH EVERY DAY 10/22/20   Belva Crome, MD  rosuvastatin (CRESTOR) 20 MG tablet Take 1 tablet (20 mg total) by mouth daily. Please keep upcoming appt for future refills. 12/18/20   Belva Crome, MD   Inpatient Medications: Scheduled Meds:  Continuous Infusions:  PRN Meds:  Allergies:    Allergies  Allergen Reactions   Amlodipine Swelling    Leg edema   Cellcept [Mycophenolate] Nausea Only   Social  History:   Social History   Socioeconomic History   Marital status: Single    Spouse name: Not on file   Number of children: Not on file   Years of education: Not on file   Highest education level: Not on file  Occupational History   Not on file  Tobacco Use   Smoking status: Former    Packs/day: 0.50    Years: 30.00    Pack years: 15.00    Types: Cigarettes    Quit date: 12/23/2011    Years since quitting: 9.1   Smokeless tobacco: Never  Vaping Use   Vaping Use: Former  Substance and Sexual Activity   Alcohol use: No   Drug use: No   Sexual activity: Not Currently    Comment: 1st intercourse 65 yo-More than 5 partners  Other Topics Concern   Not on file  Social History Narrative   HSG,  graduated from [employer] in [place]. In college [name] -grad '13 with relgious studies. [employer] - Fall '13 for MA-divinity. Marrried '73 - 2 years/ divorced. 2 son- '73, '75 - CP.    Occupation: full-time Ship broker. She lives alone with her younger son living with her part-time but he resides in a managed care facility.    Social Determinants of Health   Financial Resource Strain: Low Risk    Difficulty of Paying Living Expenses: Not hard at all  Food Insecurity: No Food Insecurity   Worried About Charity fundraiser in the Last Year: Never true   [place] in the Last Year: Never true  Transportation Needs: No Transportation Needs   Lack of Transportation (Medical): No   Lack of Transportation (Non-Medical): No  Physical Activity: Not on file  Stress: No Stress Concern Present   Feeling of Stress : Not at all  Social Connections: Moderately Integrated   Frequency of Communication with Friends and Family: More than three times a week   Frequency of Social Gatherings with Friends and Family: More than three times a week   Attends Religious Services: More than 4 times per year   Active Member of [employer] or Organizations: Yes   Attends Music therapist:  More than 4 times per year   Marital Status: Divorced  Human resources officer Violence: Not on file    Family History:    Family History  Problem Relation Age of Onset   Cancer Mother  breast and cervical   Paget's disease of bone Mother    Fibromyalgia Mother    Breast cancer Mother    Hypertension Father    Heart disease Father        PVD   GER disease Father    Cancer Paternal Grandfather        colon   Heart attack Maternal Grandmother     ROS:  Review of Systems: [y] = yes, [ ]  = no      General: Weight gain [y]; Weight loss [ ] ; Anorexia [ ] ; Fatigue [ ] ; Fever [ ] ; Chills [ ] ; Weakness [ ]    Cardiac: Chest pain/pressure [y]; Resting SOB [ ] ; Exertional SOB [y]; Orthopnea [ ] ; Pedal Edema [ ] ; Palpitations [ ] ; Syncope [ ] ; Presyncope [ ] ; Paroxysmal nocturnal dyspnea [ ]    Pulmonary: Cough [ ] ; Wheezing [ ] ; Hemoptysis [ ] ; Sputum [ ] ; Snoring [ ]    GI: Vomiting [ ] ; Dysphagia [ ] ; Melena [ ] ; Hematochezia [ ] ; Heartburn [ ] ; Abdominal pain [ ] ; Constipation [ ] ; Diarrhea [ ] ; BRBPR [ ]    GU: Hematuria [ ] ; Dysuria [ ] ; Nocturia [ ]  Vascular: Pain in legs with walking [ ] ; Pain in feet with lying flat [ ] ; Non-healing sores [ ] ; Stroke [ ] ; TIA [ ] ; Slurred speech [ ] ;   Neuro: Headaches [ ] ; Vertigo [ ] ; Seizures [ ] ; Paresthesias [ ] ;Blurred vision [ ] ; Diplopia [ ] ; Vision changes [ ]    Ortho/Skin: Arthritis [ ] ; Joint pain [ ] ; Muscle pain [ ] ; Joint swelling [ ] ; Back Pain [ ] ; Rash [ ]    Psych: Depression [ ] ; Anxiety [ ]    Heme: Bleeding problems [ ] ; Clotting disorders [ ] ; Anemia [ ]    Endocrine: Diabetes [ ] ; Thyroid dysfunction [ ]    Physical Exam/Data:   Vitals:   02/25/21 0122 02/25/21 0130 02/25/21 0200 02/25/21 0230  BP: 129/71 128/70 (!) 148/123 131/65  Pulse: 64 64 61 64  Resp: 16 (!) 21 15 (!) 23  Temp:      TempSrc:      SpO2: 98% 97% 100% 97%   No intake or output data in the 24 hours ending 02/25/21 0250 Last 3 Weights 02/22/2021 01/09/2021  11/12/2020  Weight (lbs) 270 lb 8 oz 269 lb 273 lb 3.2 oz  Weight (kg) 122.698 kg 122.018 kg 123.923 kg     There is no height or weight on file to calculate BMI.  General:  Well nourished, well developed, in no acute distress HEENT: normal Lymph: no adenopathy Neck: no JVD Endocrine:  No thryomegaly Vascular: No carotid bruits; FA pulses 2+ bilaterally without bruits  Cardiac:  normal S1, S2; RRR; no murmur  Lungs:  clear to auscultation bilaterally, no wheezing, rhonchi or rales  Abd: soft, nontender, no hepatomegaly  Ext: 1+ LE edema Musculoskeletal:  No deformities, BUE and BLE strength normal and equal Skin: warm and dry  Neuro:  CNs 2-12 intact, no focal abnormalities noted Psych:  Normal affect   EKG:  The EKG was personally reviewed and demonstrates: (02/24/21, 17:09:08) sinus bradycardia 57, PR 170, QRS 84, Qtc 451, no dynamic change c/f ischemia Telemetry:  Telemetry was personally reviewed and demonstrates: NSR  Relevant CV Studies:  TTE Result date: 01/25/17 - Left ventricle: The cavity size was normal. Systolic function was    normal. The estimated ejection fraction was in the range of 55%    to 60%.  Wall motion was normal; there were no regional wall    motion abnormalities. Features are consistent with a pseudonormal    left ventricular filling pattern, with concomitant abnormal    relaxation and increased filling pressure (grade 2 diastolic    dysfunction). Doppler parameters are consistent with elevated    ventricular end-diastolic filling pressure.  - Aortic valve: There was mild regurgitation.  - Mitral valve: There was severe regurgitation.  - Left atrium: The atrium was moderately dilated.  - Tricuspid valve: There was severe regurgitation.  - Pulmonary arteries: Systolic pressure was moderately increased.    PA peak pressure: 52 mm Hg (S).  Impressions:  - Mitral regurgitation is now severe, there is moderatel pulmonary    hypertension.   Coronary  angiography Result date: 01/23/17 Severe three-vessel coronary disease with chronic total occlusion of the right coronary due to in-stent restenosis, 85% de novo stenosis in the proximal LAD, total occlusion of the large first diagonal, 70% proximal margin restenosis of mid circumflex stent and 80% stenosis in the first obtuse marginal which is crossed by the stent. Severe inferior wall hypokinesis.  EF 45-50%.  Elevated LVEDP consistent with chronic combined systolic and diastolic heart failure  Laboratory Data:  High Sensitivity Troponin:   Recent Labs  Lab 02/24/21 1729 02/25/21 0015  TROPONINIHS 10 11     Chemistry Recent Labs  Lab 02/22/21 1501 02/24/21 1729  NA 139 140  K 4.8 3.8  CL 104 104  CO2 25 27  GLUCOSE 84 102*  BUN 10 11  CREATININE 1.17* 1.22*  CALCIUM 9.5 9.0  GFRNONAA  --  49*  ANIONGAP  --  9    Recent Labs  Lab 02/22/21 1501  PROT 6.5  AST 18  ALT 8  BILITOT 0.6   Hematology Recent Labs  Lab 02/22/21 1501 02/24/21 1729  WBC 3.0* 3.6*  RBC 3.79* 3.80*  HGB 12.7 12.8  HCT 37.8 39.0  MCV 99.7 102.6*  MCH 33.5* 33.7  MCHC 33.6 32.8  RDW 13.4 15.9*  PLT 156 168   BNP Recent Labs  Lab 02/22/21 1501  BNP 578*    DDimer  Recent Labs  Lab 02/24/21 1729  DDIMER 1.27*   Radiology/Studies:  DG Chest 2 View  Result Date: 02/24/2021 CLINICAL DATA:  Chest pain radiating to the back and arms with shortness of breath. EXAM: CHEST - 2 VIEW COMPARISON:  February 01, 2018 FINDINGS: Prior median sternotomy, CABG and mitral valve replacement. The heart size and mediastinal contours are within normal limits. Both lungs are clear. Thoracic spondylosis. Surgical clips overlie the upper abdomen. IMPRESSION: No active cardiopulmonary disease. Electronically Signed   By: Dahlia Bailiff M.D.   On: 02/24/2021 19:41   CT Angio Chest PE W/Cm &/Or Wo Cm  Result Date: 02/25/2021 CLINICAL DATA:  Difficulty breathing and positive D-dimer, initial encounter  EXAM: CT ANGIOGRAPHY CHEST WITH CONTRAST TECHNIQUE: Multidetector CT imaging of the chest was performed using the standard protocol during bolus administration of intravenous contrast. Multiplanar CT image reconstructions and MIPs were obtained to evaluate the vascular anatomy. CONTRAST:  43mL OMNIPAQUE IOHEXOL 350 MG/ML SOLN COMPARISON:  Chest x-ray from the previous day. FINDINGS: Cardiovascular: Atherosclerotic calcifications of the thoracic aorta are noted. Changes of prior coronary bypass grafting are noted. No aneurysmal dilatation of the aorta is seen. The degree of opacification is limited precluding evaluation for dissection. Heart is enlarged in size. Pulmonary artery shows a normal branching pattern bilaterally. No filling defect to suggest pulmonary  embolism is noted. Coronary calcifications are noted. Mediastinum/Nodes: Thoracic inlet is within normal limits. No sizable hilar or mediastinal adenopathy is noted. The esophagus as visualized is within normal limits with the exception of a small sliding-type hiatal hernia. Lungs/Pleura: Areas of mosaic attenuation are noted throughout both lungs consistent with air trapping. Minimal atelectatic changes in the right base are noted. No sizable effusion is seen. No pneumothorax is noted. Upper Abdomen: Visualized upper abdomen demonstrates changes of prior cholecystectomy. Musculoskeletal: No chest wall abnormality. No acute or significant osseous findings. Review of the MIP images confirms the above findings. IMPRESSION: No evidence of pulmonary emboli. Small sliding-type hiatal hernia. Changes consistent with air trapping.  No focal infiltrate is noted. Aortic Atherosclerosis (ICD10-I70.0). Electronically Signed   By: Inez Catalina M.D.   On: 02/25/2021 01:32    TIMI Risk Score for Unstable Angina or Non-ST Elevation MI:   The patient's TIMI risk score is 5, which indicates a 26% risk of all cause mortality, new or recurrent myocardial infarction or need  for urgent revascularization in the next 14 days.{  Assessment and Plan:   Chest pressure SOB Wt gain CAD (PCI 2003, CABG 2018) Ms. Malinak presents with acute on chronic worsening chest pressure and dyspnea on exertion.  While many of her symptoms are not classic for anginal chest pain, she does describe that several of her symptoms are similar to distant prior MI in 2003 and more recent symptoms in 2018 that resulted in CABG.  Although her ECG does not show dynamic changes and her troponin is reassuring, her troponins were also reassuring in 2018 prior to coronary angiography revealing significant disease and subsequent CABG.  She does have a long history of musculoskeletal pain but recurrent symptoms seem to have worsened over the past 3 weeks significant ongoing of the past 6 months.  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  She lives in a house with 1 staircase and has noticed that over the past few weeks she has had to stop getting up from staircase which was never an issue before.  Her chest discomfort now is radiation to her right arm and shoulder blades but does not change with exertion or rest and is always there with at least 4-5/10 severity for the past several months.  The dyspnea on exertion is worsening and the chest pain has changed in severity and is now an 8/10.  She has a mildly elevated BNP as opposed to her prior baselines and seems to have some symptoms of HF.  She is not on any standing diuretics.  We discussed different options for management of her symptoms and that although they may be unrelated to her heart it is difficult to rule this out without additional testing.  She has not had an echo in several years and I think this would be helpful to understanding her potential HF symptoms however I do not think that this will give [name] any definitive answer on the remainder of her symptoms.  I would  plan for echo today, diuresis, and to see if her symptoms improve with nitroglycerin.  Despite reassuring ECG and negative troponin I think we will likely need to pursue coronary angiography for reassessment of her anatomy.  Most likely if this is cardiac in etiology she has lost one of her SVGs over the past month and if that is the case cath which is failed to provide her  some diagnosis for her symptoms.  Since she has such different symptoms from a year ago I do not think that MSK pain alone can explain her presentation.  Plan for echo and diuresis today with likely need for coronary angiography before discharge.  Cardiology rounding team will follow up today.  For questions or updates, please contact [place] Please consult [email or web address] for contact info under   Signed, Dion Body, MD  02/25/2021 2:50 AM

## 2021-02-25 NOTE — Assessment & Plan Note (Signed)
   Continue home regimen of Plaquenil and azathioprine

## 2021-02-25 NOTE — Progress Notes (Signed)
Progress Note  Patient Name: [name] Date of Encounter: 02/25/2021  Primary Cardiologist: Sinclair Grooms, MD   Subjective   Patient was seen examined by her bedside in the emergency department.  She was awake when I arrived.  Speaking to the patient she tells over the last several months she has been experiencing intermittent chest discomfort.  She tells me that usually starts with a tightness and then radiates to her back.  At first she said she has lupus and has been dealing with other pain issues so she did not really pay much attention to this.  But recently the pain has gotten frequent in nature as well as it is associated with worsening shortness of breath.  She does admit though that diffuse similar to her pain 2003 when she first got diagnosed with coronary artery disease but intensity is not as bad.  She noted that she saw her primary care team on Friday and she was started on Augmentin as sinusitis was suspected.  Giving the fact that after taking the medication she did not feel better her symptoms got worse she came to the emergency department to be evaluated.  Overnight she tells me that she does not feel significantly improved.  She notes that she still feels that she is unable to lie flat although she is at 30 degrees with the bed and call if she still feels short of breath.  No other complaints at this time.  Inpatient Medications    Scheduled Meds:  amoxicillin-clavulanate  1 tablet Oral BID   aspirin  325 mg Oral Daily   azaTHIOprine  50 mg Oral BID   enoxaparin (LOVENOX) injection  40 mg Subcutaneous Daily   furosemide  40 mg Intravenous BID   hydroxychloroquine  200 mg Oral Daily   levothyroxine  75 mcg Oral Q0600   metoprolol succinate  50 mg Oral Daily   pantoprazole  40 mg Oral Daily   rosuvastatin  20 mg Oral Daily   Continuous Infusions:  PRN Meds: acetaminophen **OR** acetaminophen, albuterol, cyclobenzaprine, nitroGLYCERIN, ondansetron **OR**  ondansetron (ZOFRAN) IV, polyethylene glycol   Vital Signs    Vitals:   02/25/21 0500 02/25/21 0535 02/25/21 0715 02/25/21 0730  BP: 98/76 113/60 118/79 114/70  Pulse: 64 62 64 63  Resp: 18 18 (!) 21 20  Temp:      TempSrc:      SpO2: 94% 97% 96% 95%  Weight:    122.5 kg  Height:    5\' 7"  (1.702 m)   No intake or output data in the 24 hours ending 02/25/21 0832 Filed Weights   02/25/21 0730  Weight: 122.5 kg    Telemetry    Sinus rhythm, heart rate 60- Personally Reviewed  ECG    Sinus rhythm, heart rate 59 bpm with nonspecific interventricular conduction delay no ST segment changes- Personally Reviewed  Physical Exam   GEN: No acute distress.   Neck: No JVD Cardiac: RRR, no murmurs, rubs, or gallops.  Respiratory: Clear to auscultation bilaterally. GI: Soft, nontender, non-distended  MS: +3 bilateral pitting lower extremity edema; No deformity. Neuro:  Nonfocal  Psych: Normal affect   Labs    Chemistry Recent Labs  Lab 02/22/21 1501 02/24/21 1729  NA 139 140  K 4.8 3.8  CL 104 104  CO2 25 27  GLUCOSE 84 102*  BUN 10 11  CREATININE 1.17* 1.22*  CALCIUM 9.5 9.0  PROT 6.5  --   AST 18  --  ALT 8  --   BILITOT 0.6  --   GFRNONAA  --  49*  ANIONGAP  --  9     Hematology Recent Labs  Lab 02/22/21 1501 02/24/21 1729  WBC 3.0* 3.6*  RBC 3.79* 3.80*  HGB 12.7 12.8  HCT 37.8 39.0  MCV 99.7 102.6*  MCH 33.5* 33.7  MCHC 33.6 32.8  RDW 13.4 15.9*  PLT 156 168    Cardiac EnzymesNo results for input(s): TROPONINI in the last 168 hours. No results for input(s): TROPIPOC in the last 168 hours.   BNP Recent Labs  Lab 02/22/21 1501  BNP 578*     DDimer  Recent Labs  Lab 02/24/21 1729  DDIMER 1.27*     Radiology    DG Chest 2 View  Result Date: 02/24/2021 CLINICAL DATA:  Chest pain radiating to the back and arms with shortness of breath. EXAM: CHEST - 2 VIEW COMPARISON:  February 01, 2018 FINDINGS: Prior median sternotomy, CABG and  mitral valve replacement. The heart size and mediastinal contours are within normal limits. Both lungs are clear. Thoracic spondylosis. Surgical clips overlie the upper abdomen. IMPRESSION: No active cardiopulmonary disease. Electronically Signed   By: Dahlia Bailiff M.D.   On: 02/24/2021 19:41   CT Angio Chest PE W/Cm &/Or Wo Cm  Result Date: 02/25/2021 CLINICAL DATA:  Difficulty breathing and positive D-dimer, initial encounter EXAM: CT ANGIOGRAPHY CHEST WITH CONTRAST TECHNIQUE: Multidetector CT imaging of the chest was performed using the standard protocol during bolus administration of intravenous contrast. Multiplanar CT image reconstructions and MIPs were obtained to evaluate the vascular anatomy. CONTRAST:  50mL OMNIPAQUE IOHEXOL 350 MG/ML SOLN COMPARISON:  Chest x-ray from the previous day. FINDINGS: Cardiovascular: Atherosclerotic calcifications of the thoracic aorta are noted. Changes of prior coronary bypass grafting are noted. No aneurysmal dilatation of the aorta is seen. The degree of opacification is limited precluding evaluation for dissection. Heart is enlarged in size. Pulmonary artery shows a normal branching pattern bilaterally. No filling defect to suggest pulmonary embolism is noted. Coronary calcifications are noted. Mediastinum/Nodes: Thoracic inlet is within normal limits. No sizable hilar or mediastinal adenopathy is noted. The esophagus as visualized is within normal limits with the exception of a small sliding-type hiatal hernia. Lungs/Pleura: Areas of mosaic attenuation are noted throughout both lungs consistent with air trapping. Minimal atelectatic changes in the right base are noted. No sizable effusion is seen. No pneumothorax is noted. Upper Abdomen: Visualized upper abdomen demonstrates changes of prior cholecystectomy. Musculoskeletal: No chest wall abnormality. No acute or significant osseous findings. Review of the MIP images confirms the above findings. IMPRESSION: No  evidence of pulmonary emboli. Small sliding-type hiatal hernia. Changes consistent with air trapping.  No focal infiltrate is noted. Aortic Atherosclerosis (ICD10-I70.0). Electronically Signed   By: Inez Catalina M.D.   On: 02/25/2021 01:32    Cardiac Studies   TTE Result date: 01/25/17 - Left ventricle: The cavity size was normal. Systolic function was    normal. The estimated ejection fraction was in the range of 55%    to 60%. Wall motion was normal; there were no regional wall    motion abnormalities. Features are consistent with a pseudonormal    left ventricular filling pattern, with concomitant abnormal    relaxation and increased filling pressure (grade 2 diastolic    dysfunction). Doppler parameters are consistent with elevated    ventricular end-diastolic filling pressure.  - Aortic valve: There was mild regurgitation.  - Mitral valve: There  was severe regurgitation.  - Left atrium: The atrium was moderately dilated.  - Tricuspid valve: There was severe regurgitation.  - Pulmonary arteries: Systolic pressure was moderately increased.    PA peak pressure: 52 mm Hg (S).  Impressions:  - Mitral regurgitation is now severe, there is moderatel pulmonary    hypertension.    Coronary angiography Result date: 01/23/17 Severe three-vessel coronary disease with chronic total occlusion of the right coronary due to in-stent restenosis, 85% de novo stenosis in the proximal LAD, total occlusion of the large first diagonal, 70% proximal margin restenosis of mid circumflex stent and 80% stenosis in the first obtuse marginal which is crossed by the stent. Severe inferior wall hypokinesis.  EF 45-50%.  Elevated LVEDP consistent with chronic combined systolic and diastolic heart failure  Patient Profile     65 y.o. female with CAD s/p PCI and 4v CABG (2018), history of severe mitral regurgitation status post MV annuloplasty, postop AF, PAD, SLE, GERD, hypothyroidism, obesity and ITP. Assessment  & Plan    Coronary artery disease with chest pressure Shortness of breath History of severe mitral regurgitation status post mitral valve anoplasty Peripheral artery disease Obesity Chronic kidney disease  She has multiple symptoms however for me her chest pressure as well as her shortness of breath are both concerning given her risk factors for worsening cardiovascular events.  The first thing we are going to try to address is the fact that the patient clinically appears to be fluid overloaded with significant bilateral leg edema, and associated shortness of breath.  We will start with diuresis.  She has already been started on Lasix 40 mg twice daily which I agree with for now we will continue this.  Please get strict input and output along with daily weights.   Her transthoracic echocardiogram is pending for later today.  We will be able to reassess her LV function and any other valvular abnormalities and reassess her mitral valve which she has previously been repaired.  In terms of her chest pressure and her heavy burden of coronary artery disease with high rates of progression she will need an ischemic evaluation.  But the patient right now is unable to lie flat therefore I am hoping that if we diurese the patient we can hopefully get her to the Cath Lab for her procedure by tomorrow.  In the meantime we will continue aspirin 81 mg daily, rosuvastatin 20 mg daily, her home beta-blocker Toprol-XL 50 mg daily and if need for additional antianginal we will add Imdur 30 mg daily.  Please get hemoglobin A1c and lipid profile.  So far troponins are negative.  We will keep n.p.o. past midnight.   She also does have chronic kidney disease which we have to monitor her creatinine function in the setting of the diuretics, especially as we are also planning for an ischemic evaluation with coronary angiography.  Obesity-lifestyle modification advised which will be emphasized in the outpatient  setting. Hypothyroidism, GERD, ITP will be managed by the primary team.      For questions or updates, please contact [facility] Please consult [email or web address] for contact info under Cardiology/STEMI.      Signed, Berniece Salines, DO  02/25/2021, 8:32 AM

## 2021-02-25 NOTE — Assessment & Plan Note (Signed)
   Longstanding known history of coronary artery disease  Please see assessment and plan above

## 2021-02-25 NOTE — Assessment & Plan Note (Signed)
   Recent diagnosis of acute bacterial sinusitis several days ago by patient's outpatient provider  Will continue Augmentin therapy to completion

## 2021-02-25 NOTE — ED Notes (Signed)
Admitting at bedside 

## 2021-02-25 NOTE — H&P (Signed)
History and Physical    [name] JOA:[id number] DOB: [birth year] DOA: 02/24/2021  PCP: Janith Lima, MD  Patient coming from: Home   Chief Complaint:  Chief Complaint  Patient presents with   Chest Pain     HPI:    65 year old female with past medical history of systemic lupus erythematosus, coronary artery disease status post MI in 2003, status post CABG 2018, severe mitral regurgitation status post mitral valve annuloplasty 01/2017 simultaneously with CABG, gastroesophageal reflux disease, hypothyroidism, hyperlipidemia, history of atrial fibrillation (postoperative after CABG 2018) who presents to [facility] emergency department with complaints of chest discomfort.  Patient explains that approximately 1 week ago she began to experience episodes of chest discomfort.  Patient describes these episodes of chest discomfort as intermittent, begetting in the midsternal region and radiating to the back and right arm.  Episodes seem to occur with exertion and are also associated with mild to moderate shortness of breath.  Patient explains that she is becoming short of breath while only going up 1 flight of stairs.  Patient is now also experiencing associated increasing bilateral lower extremity swelling beyond her baseline.  Upon further questioning patient denies paroxysmal nocturnal dyspnea, pillow orthopnea, fever, cough, sick contacts, recent travel or contact with confirmed COVID-19 infection.  Patient's symptoms seem to gradually worsen until the patient presented to [facility] emergency department for evaluation.  Upon evaluation in the emergency department patient underwent CT angiogram of the chest which revealed no evidence of pulmonary embolism or pneumonia or obvious pulmonary edema.  EKG performed revealed no evidence of dynamic ST segment change.  Troponins were performed and found not to be elevated.  ER provider discussed case with Dr. Renella Cunas with  cardiology who came and evaluated the patient at the bedside and recommended treating the patient for suspected acute congestive heart failure with echocardiogram to be performed in the morning and cardiology team to follow-up with potential for cardiac catheterization later in the hospitalization.  The hospitalist group was then called to assess the patient for admission the hospital.  Review of Systems:   Review of Systems  Respiratory:  Positive for shortness of breath.   Cardiovascular:  Positive for chest pain.   Past Medical History:  Diagnosis Date   CAD (coronary artery disease)    a. remote stents/angioplasty. b. s/p CABG 01/2017 with mitral valve annuloplasty.   Esophageal reflux    GERD (gastroesophageal reflux disease)    Hypothyroidism    Immune thrombocytopenic purpura (HCC)    Lupus erythematosus    Mitral valve insufficiency and aortic valve insufficiency    Myocardial infarction (HCC)    x 2   PONV (postoperative nausea and vomiting)    Postoperative atrial fibrillation (HCC)    Severe mitral regurgitation    a. s/p MV annuloplasty 01/2017 at time of CABG.   Status post mitral valve annuloplasty    Unspecified disease of pericardium     Past Surgical History:  Procedure Laterality Date   CHOLECYSTECTOMY, LAPAROSCOPIC  2010   CORONARY ARTERY BYPASS GRAFT N/A 01/30/2017   Procedure: CORONARY ARTERY BYPASS GRAFTING (CABG), Times four  using the right saphaneous vein, harvested endoscopicly.  and left internal mammary artery .;  Surgeon: Gaye Pollack, MD;  Location: [facility] OR;  Service: Open Heart Surgery;  Laterality: N/A;   LEFT HEART CATH AND CORONARY ANGIOGRAPHY N/A 01/23/2017   Procedure: LEFT HEART CATH AND CORONARY ANGIOGRAPHY;  Surgeon: Belva Crome, MD;  Location: [place]  CV LAB;  Service: Cardiovascular;  Laterality: N/A;   MITRAL VALVE REPAIR N/A 01/30/2017   Procedure: MITRAL VALVE REPAIR (MVR);  Surgeon: Gaye Pollack, MD;  Location: [place];  Service:  Open Heart Surgery;  Laterality: N/A;   plastic surgical repair      dog bite to leg   SPLENECTOMY  5-04   TEE WITHOUT CARDIOVERSION N/A 01/27/2017   Procedure: TRANSESOPHAGEAL ECHOCARDIOGRAM (TEE);  Surgeon: Sanda Klein, MD;  Location: [facility] ENDOSCOPY;  Service: Cardiovascular;  Laterality: N/A;   TEE WITHOUT CARDIOVERSION N/A 01/30/2017   Procedure: TRANSESOPHAGEAL ECHOCARDIOGRAM (TEE);  Surgeon: Gaye Pollack, MD;  Location: [place];  Service: Open Heart Surgery;  Laterality: N/A;   ULTRASOUND GUIDANCE FOR VASCULAR ACCESS  01/23/2017   Procedure: Ultrasound Guidance For Vascular Access;  Surgeon: Belva Crome, MD;  Location: [place] CV LAB;  Service: Cardiovascular;;     reports that she quit smoking about 9 years ago. Her smoking use included cigarettes. She has a 15.00 pack-year smoking history. She has never used smokeless tobacco. She reports that she does not drink alcohol and does not use drugs.  Allergies  Allergen Reactions   Amlodipine Swelling    Leg edema   Cellcept [Mycophenolate] Nausea Only    Family History  Problem Relation Age of Onset   Cancer Mother        breast and cervical   Paget's disease of bone Mother    Fibromyalgia Mother    Breast cancer Mother    Hypertension Father    Heart disease Father        PVD   GER disease Father    Cancer Paternal Grandfather        colon   Heart attack Maternal Grandmother      Prior to Admission medications   Medication Sig Start Date End Date Taking? Authorizing Provider  albuterol (VENTOLIN HFA) 108 (90 Base) MCG/ACT inhaler Inhale 2 puffs into the lungs every 6 (six) hours as needed for wheezing or shortness of breath. 02/22/21   Michela Pitcher, NP  amoxicillin-clavulanate (AUGMENTIN) 875-125 MG tablet Take 1 tablet by mouth 2 (two) times daily. 02/22/21   Michela Pitcher, NP  azaTHIOprine (IMURAN) 50 MG tablet TAKE 1 TABLET BY MOUTH TWICE A DAY 01/17/21   Janith Lima, MD  cyclobenzaprine (FLEXERIL) 5 MG  tablet Take 5 mg by mouth 3 (three) times daily as needed. 03/20/20   [provider]  hydroxychloroquine (PLAQUENIL) 200 MG tablet Take 200 mg by mouth daily.     [provider]  indapamide (LOZOL) 1.25 MG tablet TAKE 1 TABLET BY MOUTH EVERY DAY 02/04/21   Janith Lima, MD  levothyroxine (SYNTHROID) 75 MCG tablet TAKE 1 TABLET BY MOUTH EVERY DAY 02/03/21   Janith Lima, MD  metoprolol succinate (TOPROL-XL) 50 MG 24 hr tablet TAKE 1 TABLET (50 MG TOTAL) BY MOUTH DAILY. KEEP FOLLOW UP FOR FUTURE REFILLS. 02/13/21   Belva Crome, MD  pantoprazole (PROTONIX) 40 MG tablet TAKE 1 TABLET BY MOUTH EVERY DAY 10/22/20   Belva Crome, MD  rosuvastatin (CRESTOR) 20 MG tablet Take 1 tablet (20 mg total) by mouth daily. Please keep upcoming appt for future refills. 12/18/20   Belva Crome, MD    Physical Exam: Vitals:   02/25/21 0430 02/25/21 0445 02/25/21 0500 02/25/21 0535  BP: 115/76 120/69 98/76 113/60  Pulse: 60 61 64 62  Resp: 18 17 18 18   Temp:  TempSrc:      SpO2: 92% 92% 94% 97%    Constitutional: Awake alert and oriented x3, no associated distress.   Skin: no rashes, no lesions, good skin turgor noted. Eyes: Pupils are equally reactive to light.  No evidence of scleral icterus or conjunctival pallor.  ENMT: Moist mucous membranes noted.  Posterior pharynx clear of any exudate or lesions.   Neck: normal, supple, no masses, no thyromegaly.  No evidence of jugular venous distension.   Respiratory: clear to auscultation bilaterally, no wheezing, no crackles. Normal respiratory effort. No accessory muscle use.  Cardiovascular: Regular rate and rhythm, no murmurs / rubs / gallops.  Bilateral lower extremity edema 2+ pedal pulses. No carotid bruits.  Chest:   Nontender without crepitus or deformity.   Back:   Nontender without crepitus or deformity. Abdomen: Abdomen is soft and nontender.  No evidence of intra-abdominal masses.  Positive bowel sounds noted in all  quadrants.   Musculoskeletal: No joint deformity upper and lower extremities. Good ROM, no contractures. Normal muscle tone.  Neurologic: CN 2-12 grossly intact. Sensation intact.  Patient moving all 4 extremities spontaneously.  Patient is following all commands.  Patient is responsive to verbal stimuli.   Psychiatric: Patient exhibits normal mood with appropriate affect.  Patient seems to possess insight as to their current situation.     Labs on Admission: I have personally reviewed following labs and imaging studies -   CBC: Recent Labs  Lab 02/22/21 1501 02/24/21 1729  WBC 3.0* 3.6*  HGB 12.7 12.8  HCT 37.8 39.0  MCV 99.7 102.6*  PLT 156 326   Basic Metabolic Panel: Recent Labs  Lab 02/22/21 1501 02/24/21 1729  NA 139 140  K 4.8 3.8  CL 104 104  CO2 25 27  GLUCOSE 84 102*  BUN 10 11  CREATININE 1.17* 1.22*  CALCIUM 9.5 9.0   GFR: Estimated Creatinine Clearance: 62.4 mL/min (A) (by C-G formula based on SCr of 1.22 mg/dL (H)). Liver Function Tests: Recent Labs  Lab 02/22/21 1501  AST 18  ALT 8  BILITOT 0.6  PROT 6.5   No results for input(s): LIPASE, AMYLASE in the last 168 hours. No results for input(s): AMMONIA in the last 168 hours. Coagulation Profile: No results for input(s): INR, PROTIME in the last 168 hours. Cardiac Enzymes: No results for input(s): CKTOTAL, CKMB, CKMBINDEX, TROPONINI in the last 168 hours. BNP (last 3 results) Recent Labs    08/08/20 1615  PROBNP 213.0*   HbA1C: No results for input(s): HGBA1C in the last 72 hours. CBG: No results for input(s): GLUCAP in the last 168 hours. Lipid Profile: No results for input(s): CHOL, HDL, LDLCALC, TRIG, CHOLHDL, LDLDIRECT in the last 72 hours. Thyroid Function Tests: No results for input(s): TSH, T4TOTAL, FREET4, T3FREE, THYROIDAB in the last 72 hours. Anemia Panel: No results for input(s): VITAMINB12, FOLATE, FERRITIN, TIBC, IRON, RETICCTPCT in the last 72 hours. Urine analysis:     Component Value Date/Time   COLORURINE YELLOW 08/08/2020 1650   APPEARANCEUR CLEAR 08/08/2020 1650   LABSPEC >=1.030 (A) 08/08/2020 1650   PHURINE 6.0 08/08/2020 1650   GLUCOSEU NEGATIVE 08/08/2020 1650   HGBUR NEGATIVE 08/08/2020 [place] 08/08/2020 1650   BILIRUBINUR negtaive 03/14/2016 [place] 08/08/2020 1650   PROTEINUR negative 03/14/2016 1456   PROTEINUR 30 (A) 03/09/2016 0205   UROBILINOGEN 0.2 08/08/2020 1650   NITRITE NEGATIVE 08/08/2020 [place] 08/08/2020 1650    Radiological  Exams on Admission - Personally Reviewed: DG Chest 2 View  Result Date: 02/24/2021 CLINICAL DATA:  Chest pain radiating to the back and arms with shortness of breath. EXAM: CHEST - 2 VIEW COMPARISON:  February 01, 2018 FINDINGS: Prior median sternotomy, CABG and mitral valve replacement. The heart size and mediastinal contours are within normal limits. Both lungs are clear. Thoracic spondylosis. Surgical clips overlie the upper abdomen. IMPRESSION: No active cardiopulmonary disease. Electronically Signed   By: Dahlia Bailiff M.D.   On: 02/24/2021 19:41   CT Angio Chest PE W/Cm &/Or Wo Cm  Result Date: 02/25/2021 CLINICAL DATA:  Difficulty breathing and positive D-dimer, initial encounter EXAM: CT ANGIOGRAPHY CHEST WITH CONTRAST TECHNIQUE: Multidetector CT imaging of the chest was performed using the standard protocol during bolus administration of intravenous contrast. Multiplanar CT image reconstructions and MIPs were obtained to evaluate the vascular anatomy. CONTRAST:  71mL OMNIPAQUE IOHEXOL 350 MG/ML SOLN COMPARISON:  Chest x-ray from the previous day. FINDINGS: Cardiovascular: Atherosclerotic calcifications of the thoracic aorta are noted. Changes of prior coronary bypass grafting are noted. No aneurysmal dilatation of the aorta is seen. The degree of opacification is limited precluding evaluation for dissection. Heart is enlarged in size.  Pulmonary artery shows a normal branching pattern bilaterally. No filling defect to suggest pulmonary embolism is noted. Coronary calcifications are noted. Mediastinum/Nodes: Thoracic inlet is within normal limits. No sizable hilar or mediastinal adenopathy is noted. The esophagus as visualized is within normal limits with the exception of a small sliding-type hiatal hernia. Lungs/Pleura: Areas of mosaic attenuation are noted throughout both lungs consistent with air trapping. Minimal atelectatic changes in the right base are noted. No sizable effusion is seen. No pneumothorax is noted. Upper Abdomen: Visualized upper abdomen demonstrates changes of prior cholecystectomy. Musculoskeletal: No chest wall abnormality. No acute or significant osseous findings. Review of the MIP images confirms the above findings. IMPRESSION: No evidence of pulmonary emboli. Small sliding-type hiatal hernia. Changes consistent with air trapping.  No focal infiltrate is noted. Aortic Atherosclerosis (ICD10-I70.0). Electronically Signed   By: Inez Catalina M.D.   On: 02/25/2021 01:32    EKG: Personally reviewed.  Rhythm is sinus bradycardia with heart rate of 59 bpm.  No dynamic ST segment changes appreciated.  Assessment/Plan  * Chest pain Patient presented with episodic chest discomfort with both typical and atypical features in the setting of extensive cardiac history EKG revealing no dynamic ST segment change and initial troponins are unremarkable all of which is reassuring Continuing to cycle cardiac enzymes Monitoring patient on telemetry Patient is already been evaluated by cardiology, their input is appreciated.  They are recommending several days of diuretics for suspected mild cardiogenic volume overload.  They also recommending obtaining an echocardiogram with the possibility of pursuing cardiac catheterization later in the hospitalization. Cardiology team to continue to follow later today Currently providing patient  with aspirin 325 mg daily, statin therapy As needed nitroglycerin for further episodes of chest discomfort  Coronary artery disease involving coronary bypass graft of native heart with angina pectoris ([place]) Longstanding known history of coronary artery disease Please see assessment and plan above  Exertional shortness of breath Patient complaining of exertional dyspnea which could be contributed to early congestive heart failure Patient saw outpatient provider on 12/2 who thought that symptoms may be attributable to acute bacterial sinusitis however patient has not improved with several days of Augmentin. Notable elevated BNP on 12/2, BNP on this presentation still pending Possible mild cardiogenic volume overload considering peripheral  edema and elevated BNP Echocardiogram ordered for later this morning Cardiology recommending several days of intravenous diuretics and therefore patient is been placed on Lasix 20 mg IV every 12 hours Strict input and output monitoring Monitoring renal function and electrolytes with serial chemistries Daily weights Monitoring patient on telemetry  Systemic lupus erythematosus ([place]) Continue home regimen of Plaquenil and azathioprine  Mixed hyperlipidemia Continuing home regimen of lipid lowering therapy. Obtaining lipid panel   GERD without esophagitis Continuing home regimen of daily PPI therapy.   Hypothyroidism Resume home regimen of Synthroid    Acute bacterial sinusitis-resolved as of 02/25/2021 Recent diagnosis of acute bacterial sinusitis several days ago by patient's outpatient provider Will continue Augmentin therapy to completion     Code Status:  Full code  code status decision has been confirmed with: patient Family Communication: deferred   Patient has been placed in observation and is expected to stay in the hospital for less than 2 midnights.    Vernelle Emerald MD [facility] Pager [phone]  If 7PM-7AM,  please contact night-coverage [email or web address] Use universal [place] password for that web site. If you do not have the password, please call the hospital operator.  02/25/2021, 6:25 AM

## 2021-02-25 NOTE — Assessment & Plan Note (Signed)
Continuing home regimen of daily PPI therapy.  

## 2021-02-25 NOTE — ED Notes (Signed)
Pt c/o nausea and headache. EDP notified.

## 2021-02-25 NOTE — Assessment & Plan Note (Signed)
.   Continuing home regimen of lipid lowering therapy. . Obtaining lipid panel

## 2021-02-26 ENCOUNTER — Encounter (HOSPITAL_COMMUNITY)
Admission: EM | Disposition: A | Discharge status: Home / Self Care | Payer: Self-pay | Source: Home / Self Care | Attending: Internal Medicine

## 2021-02-26 DIAGNOSIS — I2510 Atherosclerotic heart disease of native coronary artery without angina pectoris: Secondary | ICD-10-CM

## 2021-02-26 HISTORY — PX: RIGHT/LEFT HEART CATH AND CORONARY/GRAFT ANGIOGRAPHY: CATH118267

## 2021-02-26 LAB — POCT I-STAT 7, (LYTES, BLD GAS, ICA,H+H)
Acid-Base Excess: 3 mmol/L — ABNORMAL HIGH (ref 0.0–2.0)
Bicarbonate: 28.5 mmol/L — ABNORMAL HIGH (ref 20.0–28.0)
Calcium, Ion: 1.15 mmol/L (ref 1.15–1.40)
HCT: 38 % (ref 36.0–46.0)
Hemoglobin: 12.9 g/dL (ref 12.0–15.0)
O2 Saturation: 96 %
Potassium: 3.8 mmol/L (ref 3.5–5.1)
Sodium: 140 mmol/L (ref 135–145)
TCO2: 30 mmol/L (ref 22–32)
pCO2 arterial: 44.4 mmHg (ref 32.0–48.0)
pH, Arterial: 7.416 (ref 7.350–7.450)
pO2, Arterial: 81 mmHg — ABNORMAL LOW (ref 83.0–108.0)

## 2021-02-26 LAB — POCT I-STAT EG7
Acid-Base Excess: 4 mmol/L — ABNORMAL HIGH (ref 0.0–2.0)
Bicarbonate: 30.4 mmol/L — ABNORMAL HIGH (ref 20.0–28.0)
Calcium, Ion: 1.17 mmol/L (ref 1.15–1.40)
HCT: 38 % (ref 36.0–46.0)
Hemoglobin: 12.9 g/dL (ref 12.0–15.0)
O2 Saturation: 73 %
Potassium: 3.8 mmol/L (ref 3.5–5.1)
Sodium: 141 mmol/L (ref 135–145)
TCO2: 32 mmol/L (ref 22–32)
pCO2, Ven: 50 mmHg (ref 44.0–60.0)
pH, Ven: 7.392 (ref 7.250–7.430)
pO2, Ven: 40 mmHg (ref 32.0–45.0)

## 2021-02-26 LAB — COMPREHENSIVE METABOLIC PANEL
ALT: 12 U/L (ref 0–44)
AST: 20 U/L (ref 15–41)
Albumin: 3 g/dL — ABNORMAL LOW (ref 3.5–5.0)
Alkaline Phosphatase: 66 U/L (ref 38–126)
Anion gap: 8 (ref 5–15)
BUN: 13 mg/dL (ref 8–23)
CO2: 26 mmol/L (ref 22–32)
Calcium: 8.6 mg/dL — ABNORMAL LOW (ref 8.9–10.3)
Chloride: 104 mmol/L (ref 98–111)
Creatinine, Ser: 1.29 mg/dL — ABNORMAL HIGH (ref 0.44–1.00)
GFR, Estimated: 46 mL/min — ABNORMAL LOW (ref >60)
Glucose, Bld: 91 mg/dL (ref 70–99)
Potassium: 3.6 mmol/L (ref 3.5–5.1)
Sodium: 138 mmol/L (ref 135–145)
Total Bilirubin: 0.7 mg/dL (ref 0.3–1.2)
Total Protein: 6.3 g/dL — ABNORMAL LOW (ref 6.5–8.1)

## 2021-02-26 LAB — CBC WITH DIFFERENTIAL/PLATELET
Abs Immature Granulocytes: 0 10*3/uL (ref 0.00–0.07)
Basophils Absolute: 0.1 10*3/uL (ref 0.0–0.1)
Basophils Relative: 2 %
Eosinophils Absolute: 0.2 10*3/uL (ref 0.0–0.5)
Eosinophils Relative: 5 %
HCT: 36.5 % (ref 36.0–46.0)
Hemoglobin: 12.2 g/dL (ref 12.0–15.0)
Lymphocytes Relative: 19 %
Lymphs Abs: 0.6 10*3/uL — ABNORMAL LOW (ref 0.7–4.0)
MCH: 33.7 pg (ref 26.0–34.0)
MCHC: 33.4 g/dL (ref 30.0–36.0)
MCV: 100.8 fL — ABNORMAL HIGH (ref 80.0–100.0)
Monocytes Absolute: 0.3 10*3/uL (ref 0.1–1.0)
Monocytes Relative: 8 %
Neutro Abs: 2.2 10*3/uL (ref 1.7–7.7)
Neutrophils Relative %: 66 %
Platelets: 160 10*3/uL (ref 150–400)
RBC: 3.62 MIL/uL — ABNORMAL LOW (ref 3.87–5.11)
RDW: 15.5 % (ref 11.5–15.5)
WBC: 3.4 10*3/uL — ABNORMAL LOW (ref 4.0–10.5)
nRBC: 0 % (ref 0.0–0.2)
nRBC: 1 /100 WBC — ABNORMAL HIGH

## 2021-02-26 LAB — MAGNESIUM: Magnesium: 2.1 mg/dL (ref 1.7–2.4)

## 2021-02-26 SURGERY — RIGHT/LEFT HEART CATH AND CORONARY/GRAFT ANGIOGRAPHY
Anesthesia: LOCAL

## 2021-02-26 MED ORDER — HYDRALAZINE HCL 20 MG/ML IJ SOLN: 10.0000 mg | INTRAMUSCULAR | Status: AC | PRN

## 2021-02-26 MED ORDER — FENTANYL CITRATE (PF) 100 MCG/2ML IJ SOLN
INTRAMUSCULAR | Status: DC | PRN
  Administered 2021-02-26: 25 ug via INTRAVENOUS

## 2021-02-26 MED ORDER — SODIUM CHLORIDE 0.9% FLUSH: 3.0000 mL | Freq: Two times a day (BID) | INTRAVENOUS | Status: DC

## 2021-02-26 MED ORDER — HEPARIN (PORCINE) IN NACL 2000-0.9 UNIT/L-% IV SOLN
INTRAVENOUS | Status: AC
  Filled 2021-02-26: qty 1000

## 2021-02-26 MED ORDER — IOHEXOL 350 MG/ML SOLN
INTRAVENOUS | Status: DC | PRN
  Administered 2021-02-26: 120 mL via INTRA_ARTERIAL

## 2021-02-26 MED ORDER — HEPARIN (PORCINE) IN NACL 1000-0.9 UT/500ML-% IV SOLN
INTRAVENOUS | Status: DC | PRN
  Administered 2021-02-26 (×2): 500 mL

## 2021-02-26 MED ORDER — MIDAZOLAM HCL 2 MG/2ML IJ SOLN
INTRAMUSCULAR | Status: AC
  Filled 2021-02-26: qty 2

## 2021-02-26 MED ORDER — HEPARIN SODIUM (PORCINE) 1000 UNIT/ML IJ SOLN
INTRAMUSCULAR | Status: AC
  Filled 2021-02-26: qty 10

## 2021-02-26 MED ORDER — SODIUM CHLORIDE 0.9% FLUSH: 3.0000 mL | INTRAVENOUS | Status: DC | PRN

## 2021-02-26 MED ORDER — LABETALOL HCL 5 MG/ML IV SOLN: 10.0000 mg | INTRAVENOUS | Status: AC | PRN

## 2021-02-26 MED ORDER — FENTANYL CITRATE (PF) 100 MCG/2ML IJ SOLN
INTRAMUSCULAR | Status: AC
  Filled 2021-02-26: qty 2

## 2021-02-26 MED ORDER — SODIUM CHLORIDE 0.9 % IV SOLN: 250.0000 mL | INTRAVENOUS | Status: DC | PRN

## 2021-02-26 MED ORDER — MIDAZOLAM HCL 2 MG/2ML IJ SOLN
INTRAMUSCULAR | Status: DC | PRN
  Administered 2021-02-26: 1 mg via INTRAVENOUS

## 2021-02-26 MED ORDER — SODIUM CHLORIDE 0.9 % IV SOLN: INTRAVENOUS | Status: DC

## 2021-02-26 MED ORDER — VERAPAMIL HCL 2.5 MG/ML IV SOLN
INTRAVENOUS | Status: AC
  Filled 2021-02-26: qty 2

## 2021-02-26 MED ORDER — OXYCODONE HCL 5 MG PO TABS
5.0000 mg | ORAL_TABLET | ORAL | Status: DC | PRN
  Administered 2021-02-26: 5 mg via ORAL
  Administered 2021-02-27: 10 mg via ORAL
  Filled 2021-02-26: qty 1
  Filled 2021-02-26: qty 2

## 2021-02-26 MED ORDER — ASPIRIN 81 MG PO CHEW
CHEWABLE_TABLET | ORAL | Status: AC
  Administered 2021-02-26: 81 mg
  Filled 2021-02-26: qty 1

## 2021-02-26 MED ORDER — HEPARIN SODIUM (PORCINE) 1000 UNIT/ML IJ SOLN
INTRAMUSCULAR | Status: DC | PRN
  Administered 2021-02-26: 6000 [IU] via INTRAVENOUS

## 2021-02-26 MED ORDER — ENOXAPARIN SODIUM 40 MG/0.4ML IJ SOSY
40.0000 mg | PREFILLED_SYRINGE | INTRAMUSCULAR | Status: DC
  Administered 2021-02-27: 40 mg via SUBCUTANEOUS
  Filled 2021-02-26: qty 0.4

## 2021-02-26 MED ORDER — SODIUM CHLORIDE 0.9 % IV SOLN: INTRAVENOUS | Status: AC

## 2021-02-26 MED ORDER — ASPIRIN 81 MG PO CHEW
81.0000 mg | CHEWABLE_TABLET | Freq: Every day | ORAL | Status: DC
  Administered 2021-02-27: 81 mg via ORAL
  Filled 2021-02-26: qty 1

## 2021-02-26 MED ORDER — ONDANSETRON HCL 4 MG/2ML IJ SOLN
4.0000 mg | Freq: Four times a day (QID) | INTRAMUSCULAR | Status: DC | PRN
  Administered 2021-02-27: 4 mg via INTRAVENOUS
  Filled 2021-02-26: qty 2

## 2021-02-26 MED ORDER — ACETAMINOPHEN 325 MG PO TABS: 650.0000 mg | ORAL_TABLET | ORAL | Status: DC | PRN

## 2021-02-26 MED ORDER — LIDOCAINE HCL (PF) 1 % IJ SOLN
INTRAMUSCULAR | Status: DC | PRN
  Administered 2021-02-26 (×2): 5 mL

## 2021-02-26 MED ORDER — LIDOCAINE HCL (PF) 1 % IJ SOLN
INTRAMUSCULAR | Status: AC
  Filled 2021-02-26: qty 30

## 2021-02-26 MED ORDER — SODIUM CHLORIDE 0.9% FLUSH
3.0000 mL | Freq: Two times a day (BID) | INTRAVENOUS | Status: DC
  Administered 2021-02-26 – 2021-02-27 (×2): 3 mL via INTRAVENOUS

## 2021-02-26 SURGICAL SUPPLY — 12 items
CATH BALLN WEDGE 5F 110CM (CATHETERS) ×1 IMPLANT
CATH INFINITI 5FR MPB2 (CATHETERS) ×1 IMPLANT
CATH INFINITI 5FR MULTPACK ANG (CATHETERS) ×1 IMPLANT
DEVICE RAD COMP TR BAND LRG (VASCULAR PRODUCTS) ×1 IMPLANT
GLIDESHEATH SLEND A-KIT 6F 22G (SHEATH) ×1 IMPLANT
GUIDEWIRE INQWIRE 1.5J.035X260 (WIRE) IMPLANT
INQWIRE 1.5J .035X260CM (WIRE) ×2
KIT HEART LEFT (KITS) ×2 IMPLANT
PACK CARDIAC CATHETERIZATION (CUSTOM PROCEDURE TRAY) ×2 IMPLANT
SHEATH GLIDE SLENDER 4/5FR (SHEATH) ×1 IMPLANT
TRANSDUCER W/STOPCOCK (MISCELLANEOUS) ×2 IMPLANT
TUBING CIL FLEX 10 FLL-RA (TUBING) ×2 IMPLANT

## 2021-02-26 NOTE — Progress Notes (Signed)
Heart Failure Nurse Navigator Progress Note  Following for HV TOC readiness. Pt complete cardiac cath today. Will plan for HV TOC clinic appt upon DC for HFpEF.   EF 50-55%  Plan to complete interview tomorrow as pt would like to rest now.   Pricilla Holm, MSN, RN Heart Failure Nurse Navigator [phone]

## 2021-02-26 NOTE — CV Procedure (Addendum)
Patent LIMA to LAD Patent sequential SVG to OM1 and OM 2 Occluded sequential SVG to PDA and PL (appears chronic). Patent left main Proximal LAD 90% stenosis with competitive distal flow due to patent LIMA to mid vessel Occluded first and second obtuse marginal of circumflex.  Circumflex is otherwise diffusely diseased with proximal 75% stenosis.  A small distal third OM is potentially threatened. Proximal RCA occlusion. Normal LVEDP.  LVEF estimated at 55%. Normal right heart pressures and capillary wedge pressure.

## 2021-02-26 NOTE — Progress Notes (Signed)
Progress Note  Patient Name: [name] Date of Encounter: 02/26/2021  Primary Cardiologist: Sinclair Grooms, MD   Subjective   Patient seen examined at bedside.  She was lying flat in bed when I arrived.  She has no complaints.  Shortness of breath improved.  She still is experiencing the chest discomfort intermittently with some back pain.  Inpatient Medications    Scheduled Meds:  amoxicillin-clavulanate  1 tablet Oral BID   aspirin  325 mg Oral Daily   azaTHIOprine  50 mg Oral BID   enoxaparin (LOVENOX) injection  40 mg Subcutaneous Daily   furosemide  40 mg Intravenous BID   hydroxychloroquine  200 mg Oral Daily   levothyroxine  75 mcg Oral Q0600   metoprolol succinate  50 mg Oral Daily   pantoprazole  40 mg Oral Daily   rosuvastatin  20 mg Oral Daily   sodium chloride flush  3 mL Intravenous Q12H   Continuous Infusions:  sodium chloride     [START ON 02/27/2021] sodium chloride     PRN Meds: sodium chloride, acetaminophen **OR** acetaminophen, albuterol, cyclobenzaprine, metoCLOPramide (REGLAN) injection, nitroGLYCERIN, ondansetron **OR** ondansetron (ZOFRAN) IV, polyethylene glycol, sodium chloride flush   Vital Signs    Vitals:   02/25/21 1956 02/26/21 0039 02/26/21 0454 02/26/21 0729  BP: (!) 101/59 102/70 (!) 100/56 112/61  Pulse: 66 70 65 65  Resp: 18 18 18 18   Temp: 98.2 F (36.8 C) 98.7 F (37.1 C) 98 F (36.7 C) 98.1 F (36.7 C)  TempSrc: Oral Oral Oral Oral  SpO2: 95% 96% 95% 94%  Weight:   120.6 kg   Height:        Intake/Output Summary (Last 24 hours) at 02/26/2021 0920 Last data filed at 02/26/2021 0457 Gross per 24 hour  Intake 360 ml  Output 550 ml  Net -190 ml   Filed Weights   02/25/21 0730 02/26/21 0454  Weight: 122.5 kg 120.6 kg    Telemetry    Sinus rhythm- Personally Reviewed  ECG    None today- Personally Reviewed  Physical Exam   GEN: No acute distress.   Neck: No JVD Cardiac: RRR, no murmurs, rubs, or  gallops.  Respiratory: Clear to auscultation bilaterally. GI: Soft, nontender, non-distended  MS: +1 bilateral lower extremity edema; No deformity. Neuro:  Nonfocal  Psych: Normal affect   Labs    Chemistry Recent Labs  Lab 02/22/21 1501 02/24/21 1729 02/26/21 0108  NA 139 140 138  K 4.8 3.8 3.6  CL 104 104 104  CO2 25 27 26   GLUCOSE 84 102* 91  BUN 10 11 13   CREATININE 1.17* 1.22* 1.29*  CALCIUM 9.5 9.0 8.6*  PROT 6.5  --  6.3*  ALBUMIN  --   --  3.0*  AST 18  --  20  ALT 8  --  12  ALKPHOS  --   --  66  BILITOT 0.6  --  0.7  GFRNONAA  --  49* 46*  ANIONGAP  --  9 8     Hematology Recent Labs  Lab 02/22/21 1501 02/24/21 1729 02/26/21 0108  WBC 3.0* 3.6* 3.4*  RBC 3.79* 3.80* 3.62*  HGB 12.7 12.8 12.2  HCT 37.8 39.0 36.5  MCV 99.7 102.6* 100.8*  MCH 33.5* 33.7 33.7  MCHC 33.6 32.8 33.4  RDW 13.4 15.9* 15.5  PLT 156 168 160    Cardiac EnzymesNo results for input(s): TROPONINI in the last 168 hours. No results for input(s):  TROPIPOC in the last 168 hours.   BNP Recent Labs  Lab 02/22/21 1501 02/25/21 0757  BNP 578* 310.9*     DDimer  Recent Labs  Lab 02/24/21 1729  DDIMER 1.27*     Radiology    DG Chest 2 View  Result Date: 02/24/2021 CLINICAL DATA:  Chest pain radiating to the back and arms with shortness of breath. EXAM: CHEST - 2 VIEW COMPARISON:  February 01, 2018 FINDINGS: Prior median sternotomy, CABG and mitral valve replacement. The heart size and mediastinal contours are within normal limits. Both lungs are clear. Thoracic spondylosis. Surgical clips overlie the upper abdomen. IMPRESSION: No active cardiopulmonary disease. Electronically Signed   By: Dahlia Bailiff M.D.   On: 02/24/2021 19:41   CT Angio Chest PE W/Cm &/Or Wo Cm  Result Date: 02/25/2021 CLINICAL DATA:  Difficulty breathing and positive D-dimer, initial encounter EXAM: CT ANGIOGRAPHY CHEST WITH CONTRAST TECHNIQUE: Multidetector CT imaging of the chest was performed using  the standard protocol during bolus administration of intravenous contrast. Multiplanar CT image reconstructions and MIPs were obtained to evaluate the vascular anatomy. CONTRAST:  83mL OMNIPAQUE IOHEXOL 350 MG/ML SOLN COMPARISON:  Chest x-ray from the previous day. FINDINGS: Cardiovascular: Atherosclerotic calcifications of the thoracic aorta are noted. Changes of prior coronary bypass grafting are noted. No aneurysmal dilatation of the aorta is seen. The degree of opacification is limited precluding evaluation for dissection. Heart is enlarged in size. Pulmonary artery shows a normal branching pattern bilaterally. No filling defect to suggest pulmonary embolism is noted. Coronary calcifications are noted. Mediastinum/Nodes: Thoracic inlet is within normal limits. No sizable hilar or mediastinal adenopathy is noted. The esophagus as visualized is within normal limits with the exception of a small sliding-type hiatal hernia. Lungs/Pleura: Areas of mosaic attenuation are noted throughout both lungs consistent with air trapping. Minimal atelectatic changes in the right base are noted. No sizable effusion is seen. No pneumothorax is noted. Upper Abdomen: Visualized upper abdomen demonstrates changes of prior cholecystectomy. Musculoskeletal: No chest wall abnormality. No acute or significant osseous findings. Review of the MIP images confirms the above findings. IMPRESSION: No evidence of pulmonary emboli. Small sliding-type hiatal hernia. Changes consistent with air trapping.  No focal infiltrate is noted. Aortic Atherosclerosis (ICD10-I70.0). Electronically Signed   By: Inez Catalina M.D.   On: 02/25/2021 01:32   ECHOCARDIOGRAM COMPLETE  Result Date: 02/25/2021    ECHOCARDIOGRAM REPORT   Patient Name:   [name] Date of Exam: 02/25/2021 Medical Rec #:  [id number]          Height:       67.0 in Accession #:    [id number]         Weight:       270.0 lb Date of Birth:  May 22, 1955         BSA:          2.297 m  Patient Age:    65 years           BP:           114/71 mmHg Patient Gender: F                  HR:           64 bpm. Exam Location:  Inpatient Procedure: 2D Echo, Color Doppler and Cardiac Doppler Indications:    R07.9* Chest pain, unspecified  History:        Patient has prior history of Echocardiogram examinations, most  recent 02/09/2017. Arrythmias:Atrial Fibrillation; Risk                 Factors:Hypertension, Dyslipidemia and Lupus. 02/09/17 CABG and                 Mitral Valve Repair with 93mm Sorin Mitral Memo 3D Ring.  Sonographer:    Raquel Sarna Senior RDCS Referring Phys: [id number] [place]  1. Left ventricular ejection fraction, by estimation, is 50 to 55%. The left ventricle has low normal function. The left ventricle demonstrates regional wall motion abnormalities. Basal inferolateral akinesis. There is mild left ventricular hypertrophy.  Left ventricular diastolic parameters are indeterminate. Poor visualization of endocardium, consider limited echo with contrast to better evaluate wall motion abnormalities.  2. Right ventricular systolic function was not well visualized. The right ventricular size is not well visualized. There is normal pulmonary artery systolic pressure. The estimated right ventricular systolic pressure is 27.7 mmHg.  3. There is a prosthetic annuloplasty ring present in the mitral position     Trivial mitral valve regurgitation. Mild to moderate mitral stenosis. MG 27mmHg at 64bpm, MVA 1.4 cm^2 by continuity equation  4. The aortic valve was not well visualized. Aortic valve regurgitation is trivial. No aortic stenosis is present.  5. Aortic dilatation noted. There is mild dilatation of the ascending aorta, measuring 39 mm.  6. The inferior vena cava is normal in size with greater than 50% respiratory variability, suggesting right atrial pressure of 3 mmHg. FINDINGS  Left Ventricle: Left ventricular ejection fraction, by estimation, is 50 to 55%. The  left ventricle has low normal function. The left ventricle demonstrates regional wall motion abnormalities. The left ventricular internal cavity size was normal in size. There is mild left ventricular hypertrophy. Left ventricular diastolic parameters are indeterminate. Right Ventricle: The right ventricular size is not well visualized. Right vetricular wall thickness was not well visualized. Right ventricular systolic function was not well visualized. There is normal pulmonary artery systolic pressure. The tricuspid regurgitant velocity is 2.74 m/s, and with an assumed right atrial pressure of 3 mmHg, the estimated right ventricular systolic pressure is 41.2 mmHg. Left Atrium: Left atrial size was normal in size. Right Atrium: Right atrial size was normal in size. Pericardium: There is no evidence of pericardial effusion. Mitral Valve: The mitral valve has been repaired/replaced. Trivial mitral valve regurgitation. There is a prosthetic annuloplasty ring present in the mitral position. Mild to moderate mitral valve stenosis. MV peak gradient, 8.3 mmHg. The mean mitral valve gradient is 4.0 mmHg. Tricuspid Valve: The tricuspid valve is normal in structure. Tricuspid valve regurgitation is trivial. Aortic Valve: The aortic valve was not well visualized. Aortic valve regurgitation is trivial. No aortic stenosis is present. Pulmonic Valve: The pulmonic valve was not well visualized. Pulmonic valve regurgitation is not visualized. Aorta: The aortic root is normal in size and structure and aortic dilatation noted. There is mild dilatation of the ascending aorta, measuring 39 mm. Venous: The inferior vena cava is normal in size with greater than 50% respiratory variability, suggesting right atrial pressure of 3 mmHg. IAS/Shunts: The interatrial septum was not well visualized.  LEFT VENTRICLE PLAX 2D LVIDd:         4.70 cm LVIDs:         4.10 cm LV PW:         0.60 cm LV IVS:        1.10 cm LVOT diam:     1.90 cm LV SV:  71 LV SV Index:   31 LVOT Area:     2.84 cm  RIGHT VENTRICLE RV S prime:     11.70 cm/s TAPSE (M-mode): 2.0 cm LEFT ATRIUM             Index        RIGHT ATRIUM           Index LA diam:        4.00 cm 1.74 cm/m   RA Area:     19.50 cm LA Vol (A2C):   73.1 ml 31.82 ml/m  RA Volume:   53.60 ml  23.33 ml/m LA Vol (A4C):   56.4 ml 24.55 ml/m LA Biplane Vol: 66.7 ml 29.03 ml/m  AORTIC VALVE LVOT Vmax:   125.00 cm/s LVOT Vmean:  93.700 cm/s LVOT VTI:    0.250 m  AORTA Ao Root diam: 3.20 cm Ao Asc diam:  3.90 cm MITRAL VALVE                TRICUSPID VALVE MV Area (PHT): 1.60 cm     TR Peak grad:   30.0 mmHg MV Area VTI:   1.44 cm     TR Vmax:        274.00 cm/s MV Peak grad:  8.3 mmHg MV Mean grad:  4.0 mmHg     SHUNTS MV Vmax:       1.44 m/s     Systemic VTI:  0.25 m MV Vmean:      98.8 cm/s    Systemic Diam: 1.90 cm MV Decel Time: 474 msec MR Peak grad: 97.6 mmHg MR Mean grad: 48.0 mmHg MR Vmax:      494.00 cm/s MR Vmean:     312.0 cm/s MV E velocity: 127.00 cm/s MV A velocity: 106.00 cm/s MV E/A ratio:  1.20 Oswaldo Milian MD Electronically signed by Oswaldo Milian MD Signature Date/Time: 02/25/2021/5:43:55 PM    Final     Cardiac Studies   Transthoracic echocardiogram February 25, 2021 IMPRESSIONS     1. Left ventricular ejection fraction, by estimation, is 50 to 55%. The  left ventricle has low normal function. The left ventricle demonstrates  regional wall motion abnormalities. Basal inferolateral akinesis. There is  mild left ventricular hypertrophy.   Left ventricular diastolic parameters are indeterminate. Poor  visualization of endocardium, consider limited echo with contrast to  better evaluate wall motion abnormalities.   2. Right ventricular systolic function was not well visualized. The right  ventricular size is not well visualized. There is normal pulmonary artery  systolic pressure. The estimated right ventricular systolic pressure is  02.7 mmHg.   3. There is a  prosthetic annuloplasty ring present in the mitral position      Trivial mitral valve regurgitation. Mild to moderate mitral stenosis.  MG 74mmHg at 64bpm, MVA 1.4 cm^2 by continuity equation   4. The aortic valve was not well visualized. Aortic valve regurgitation  is trivial. No aortic stenosis is present.   5. Aortic dilatation noted. There is mild dilatation of the ascending  aorta, measuring 39 mm.   6. The inferior vena cava is normal in size with greater than 50%  respiratory variability, suggesting right atrial pressure of 3 mmHg.   FINDINGS   Left Ventricle: Left ventricular ejection fraction, by estimation, is 50  to 55%. The left ventricle has low normal function. The left ventricle  demonstrates regional wall motion abnormalities. The left ventricular  internal cavity size was normal in  size.  There is mild left ventricular hypertrophy. Left ventricular  diastolic parameters are indeterminate.   Right Ventricle: The right ventricular size is not well visualized. Right  vetricular wall thickness was not well visualized. Right ventricular  systolic function was not well visualized. There is normal pulmonary  artery systolic pressure. The tricuspid  regurgitant velocity is 2.74 m/s, and with an assumed right atrial  pressure of 3 mmHg, the estimated right ventricular systolic pressure is  78.9 mmHg.   Left Atrium: Left atrial size was normal in size.   Right Atrium: Right atrial size was normal in size.   Pericardium: There is no evidence of pericardial effusion.   Mitral Valve: The mitral valve has been repaired/replaced. Trivial mitral  valve regurgitation. There is a prosthetic annuloplasty ring present in  the mitral position. Mild to moderate mitral valve stenosis. MV peak  gradient, 8.3 mmHg. The mean mitral  valve gradient is 4.0 mmHg.   Tricuspid Valve: The tricuspid valve is normal in structure. Tricuspid  valve regurgitation is trivial.   Aortic Valve: The  aortic valve was not well visualized. Aortic valve  regurgitation is trivial. No aortic stenosis is present.   Pulmonic Valve: The pulmonic valve was not well visualized. Pulmonic valve  regurgitation is not visualized.   Aorta: The aortic root is normal in size and structure and aortic  dilatation noted. There is mild dilatation of the ascending aorta,  measuring 39 mm.   Venous: The inferior vena cava is normal in size with greater than 50%  respiratory variability, suggesting right atrial pressure of 3 mmHg.   IAS/Shunts: The interatrial septum was not well visualized.       Patient Profile     65 y.o. female CAD s/p PCI and 4v CABG (2018), history of severe mitral regurgitation status post MV annuloplasty, postop AF, PAD, SLE, GERD, hypothyroidism, obesity and ITP.  Assessment & Plan    Coronary artery disease with chest pressure Shortness of breath History of severe mitral regurgitation status post mitral valve anoplasty Peripheral artery disease Obesity Chronic kidney disease   She has responded well to the diuretics.  Redness of breath is improving but she still does have some chest discomfort and has not subsided.  For now we will keep her on the diuretics.  But I am concerned about her symptoms which appears to be her anginal equivalent in addition there are new wall motion abnormalities that were not seen in 2018.  Therefore with history of heavy burden of coronary artery disease and high risk for progression the clinical next back step is to move forward with a cardiac catheterization in this patient.  I have discussed with the patient about the right and left heart catheterization she is agreeable for this.  The patient understands that risks include but are not limited to stroke (1 in 1000), death (1 in 62), kidney failure [usually temporary] (1 in 500), bleeding (1 in 200), allergic reaction [possibly serious] (1 in 200), and agrees to proceed.  She also does have  chronic kidney disease which her creatinine appears to be at baseline.  We will continue to monitor and gentle hydration for her procedure.   We will continue her current lipid-lowering agents.    For questions or updates, please contact [facility] Please consult [email or web address] for contact info under Cardiology/STEMI.      Signed, Jie Stickels, DO  02/26/2021, 9:20 AM

## 2021-02-26 NOTE — H&P (Signed)
Atypical CP Prior CABG: -Left internal mammary graft to the LAD -Sequential SVG to OM1 and OM2 -SVG to PDA  Negative markers.

## 2021-02-26 NOTE — H&P (View-Only) (Signed)
Progress Note  Patient Name: [name] Date of Encounter: 02/26/2021  Primary Cardiologist: Sinclair Grooms, MD   Subjective   Patient seen examined at bedside.  She was lying flat in bed when I arrived.  She has no complaints.  Shortness of breath improved.  She still is experiencing the chest discomfort intermittently with some back pain.  Inpatient Medications    Scheduled Meds:  amoxicillin-clavulanate  1 tablet Oral BID   aspirin  325 mg Oral Daily   azaTHIOprine  50 mg Oral BID   enoxaparin (LOVENOX) injection  40 mg Subcutaneous Daily   furosemide  40 mg Intravenous BID   hydroxychloroquine  200 mg Oral Daily   levothyroxine  75 mcg Oral Q0600   metoprolol succinate  50 mg Oral Daily   pantoprazole  40 mg Oral Daily   rosuvastatin  20 mg Oral Daily   sodium chloride flush  3 mL Intravenous Q12H   Continuous Infusions:  sodium chloride     [START ON 02/27/2021] sodium chloride     PRN Meds: sodium chloride, acetaminophen **OR** acetaminophen, albuterol, cyclobenzaprine, metoCLOPramide (REGLAN) injection, nitroGLYCERIN, ondansetron **OR** ondansetron (ZOFRAN) IV, polyethylene glycol, sodium chloride flush   Vital Signs    Vitals:   02/25/21 1956 02/26/21 0039 02/26/21 0454 02/26/21 0729  BP: (!) 101/59 102/70 (!) 100/56 112/61  Pulse: 66 70 65 65  Resp: 18 18 18 18   Temp: 98.2 F (36.8 C) 98.7 F (37.1 C) 98 F (36.7 C) 98.1 F (36.7 C)  TempSrc: Oral Oral Oral Oral  SpO2: 95% 96% 95% 94%  Weight:   120.6 kg   Height:        Intake/Output Summary (Last 24 hours) at 02/26/2021 0920 Last data filed at 02/26/2021 0457 Gross per 24 hour  Intake 360 ml  Output 550 ml  Net -190 ml   Filed Weights   02/25/21 0730 02/26/21 0454  Weight: 122.5 kg 120.6 kg    Telemetry    Sinus rhythm- Personally Reviewed  ECG    None today- Personally Reviewed  Physical Exam   GEN: No acute distress.   Neck: No JVD Cardiac: RRR, no murmurs, rubs, or  gallops.  Respiratory: Clear to auscultation bilaterally. GI: Soft, nontender, non-distended  MS: +1 bilateral lower extremity edema; No deformity. Neuro:  Nonfocal  Psych: Normal affect   Labs    Chemistry Recent Labs  Lab 02/22/21 1501 02/24/21 1729 02/26/21 0108  NA 139 140 138  K 4.8 3.8 3.6  CL 104 104 104  CO2 25 27 26   GLUCOSE 84 102* 91  BUN 10 11 13   CREATININE 1.17* 1.22* 1.29*  CALCIUM 9.5 9.0 8.6*  PROT 6.5  --  6.3*  ALBUMIN  --   --  3.0*  AST 18  --  20  ALT 8  --  12  ALKPHOS  --   --  66  BILITOT 0.6  --  0.7  GFRNONAA  --  49* 46*  ANIONGAP  --  9 8     Hematology Recent Labs  Lab 02/22/21 1501 02/24/21 1729 02/26/21 0108  WBC 3.0* 3.6* 3.4*  RBC 3.79* 3.80* 3.62*  HGB 12.7 12.8 12.2  HCT 37.8 39.0 36.5  MCV 99.7 102.6* 100.8*  MCH 33.5* 33.7 33.7  MCHC 33.6 32.8 33.4  RDW 13.4 15.9* 15.5  PLT 156 168 160    Cardiac EnzymesNo results for input(s): TROPONINI in the last 168 hours. No results for input(s):  TROPIPOC in the last 168 hours.   BNP Recent Labs  Lab 02/22/21 1501 02/25/21 0757  BNP 578* 310.9*     DDimer  Recent Labs  Lab 02/24/21 1729  DDIMER 1.27*     Radiology    DG Chest 2 View  Result Date: 02/24/2021 CLINICAL DATA:  Chest pain radiating to the back and arms with shortness of breath. EXAM: CHEST - 2 VIEW COMPARISON:  February 01, 2018 FINDINGS: Prior median sternotomy, CABG and mitral valve replacement. The heart size and mediastinal contours are within normal limits. Both lungs are clear. Thoracic spondylosis. Surgical clips overlie the upper abdomen. IMPRESSION: No active cardiopulmonary disease. Electronically Signed   By: Dahlia Bailiff M.D.   On: 02/24/2021 19:41   CT Angio Chest PE W/Cm &/Or Wo Cm  Result Date: 02/25/2021 CLINICAL DATA:  Difficulty breathing and positive D-dimer, initial encounter EXAM: CT ANGIOGRAPHY CHEST WITH CONTRAST TECHNIQUE: Multidetector CT imaging of the chest was performed using  the standard protocol during bolus administration of intravenous contrast. Multiplanar CT image reconstructions and MIPs were obtained to evaluate the vascular anatomy. CONTRAST:  33mL OMNIPAQUE IOHEXOL 350 MG/ML SOLN COMPARISON:  Chest x-ray from the previous day. FINDINGS: Cardiovascular: Atherosclerotic calcifications of the thoracic aorta are noted. Changes of prior coronary bypass grafting are noted. No aneurysmal dilatation of the aorta is seen. The degree of opacification is limited precluding evaluation for dissection. Heart is enlarged in size. Pulmonary artery shows a normal branching pattern bilaterally. No filling defect to suggest pulmonary embolism is noted. Coronary calcifications are noted. Mediastinum/Nodes: Thoracic inlet is within normal limits. No sizable hilar or mediastinal adenopathy is noted. The esophagus as visualized is within normal limits with the exception of a small sliding-type hiatal hernia. Lungs/Pleura: Areas of mosaic attenuation are noted throughout both lungs consistent with air trapping. Minimal atelectatic changes in the right base are noted. No sizable effusion is seen. No pneumothorax is noted. Upper Abdomen: Visualized upper abdomen demonstrates changes of prior cholecystectomy. Musculoskeletal: No chest wall abnormality. No acute or significant osseous findings. Review of the MIP images confirms the above findings. IMPRESSION: No evidence of pulmonary emboli. Small sliding-type hiatal hernia. Changes consistent with air trapping.  No focal infiltrate is noted. Aortic Atherosclerosis (ICD10-I70.0). Electronically Signed   By: Inez Catalina M.D.   On: 02/25/2021 01:32   ECHOCARDIOGRAM COMPLETE  Result Date: 02/25/2021    ECHOCARDIOGRAM REPORT   Patient Name:   [name] Date of Exam: 02/25/2021 Medical Rec #:  [id number]          Height:       67.0 in Accession #:    [id number]         Weight:       270.0 lb Date of Birth:  03-31-1955         BSA:          2.297 m  Patient Age:    65 years           BP:           114/71 mmHg Patient Gender: F                  HR:           64 bpm. Exam Location:  Inpatient Procedure: 2D Echo, Color Doppler and Cardiac Doppler Indications:    R07.9* Chest pain, unspecified  History:        Patient has prior history of Echocardiogram examinations, most  recent 02/09/2017. Arrythmias:Atrial Fibrillation; Risk                 Factors:Hypertension, Dyslipidemia and Lupus. 02/09/17 CABG and                 Mitral Valve Repair with 8mm Sorin Mitral Memo 3D Ring.  Sonographer:    Raquel Sarna Senior RDCS Referring Phys: [id number] [place]  1. Left ventricular ejection fraction, by estimation, is 50 to 55%. The left ventricle has low normal function. The left ventricle demonstrates regional wall motion abnormalities. Basal inferolateral akinesis. There is mild left ventricular hypertrophy.  Left ventricular diastolic parameters are indeterminate. Poor visualization of endocardium, consider limited echo with contrast to better evaluate wall motion abnormalities.  2. Right ventricular systolic function was not well visualized. The right ventricular size is not well visualized. There is normal pulmonary artery systolic pressure. The estimated right ventricular systolic pressure is 41.2 mmHg.  3. There is a prosthetic annuloplasty ring present in the mitral position     Trivial mitral valve regurgitation. Mild to moderate mitral stenosis. MG 40mmHg at 64bpm, MVA 1.4 cm^2 by continuity equation  4. The aortic valve was not well visualized. Aortic valve regurgitation is trivial. No aortic stenosis is present.  5. Aortic dilatation noted. There is mild dilatation of the ascending aorta, measuring 39 mm.  6. The inferior vena cava is normal in size with greater than 50% respiratory variability, suggesting right atrial pressure of 3 mmHg. FINDINGS  Left Ventricle: Left ventricular ejection fraction, by estimation, is 50 to 55%. The  left ventricle has low normal function. The left ventricle demonstrates regional wall motion abnormalities. The left ventricular internal cavity size was normal in size. There is mild left ventricular hypertrophy. Left ventricular diastolic parameters are indeterminate. Right Ventricle: The right ventricular size is not well visualized. Right vetricular wall thickness was not well visualized. Right ventricular systolic function was not well visualized. There is normal pulmonary artery systolic pressure. The tricuspid regurgitant velocity is 2.74 m/s, and with an assumed right atrial pressure of 3 mmHg, the estimated right ventricular systolic pressure is 87.8 mmHg. Left Atrium: Left atrial size was normal in size. Right Atrium: Right atrial size was normal in size. Pericardium: There is no evidence of pericardial effusion. Mitral Valve: The mitral valve has been repaired/replaced. Trivial mitral valve regurgitation. There is a prosthetic annuloplasty ring present in the mitral position. Mild to moderate mitral valve stenosis. MV peak gradient, 8.3 mmHg. The mean mitral valve gradient is 4.0 mmHg. Tricuspid Valve: The tricuspid valve is normal in structure. Tricuspid valve regurgitation is trivial. Aortic Valve: The aortic valve was not well visualized. Aortic valve regurgitation is trivial. No aortic stenosis is present. Pulmonic Valve: The pulmonic valve was not well visualized. Pulmonic valve regurgitation is not visualized. Aorta: The aortic root is normal in size and structure and aortic dilatation noted. There is mild dilatation of the ascending aorta, measuring 39 mm. Venous: The inferior vena cava is normal in size with greater than 50% respiratory variability, suggesting right atrial pressure of 3 mmHg. IAS/Shunts: The interatrial septum was not well visualized.  LEFT VENTRICLE PLAX 2D LVIDd:         4.70 cm LVIDs:         4.10 cm LV PW:         0.60 cm LV IVS:        1.10 cm LVOT diam:     1.90 cm LV SV:  71 LV SV Index:   31 LVOT Area:     2.84 cm  RIGHT VENTRICLE RV S prime:     11.70 cm/s TAPSE (M-mode): 2.0 cm LEFT ATRIUM             Index        RIGHT ATRIUM           Index LA diam:        4.00 cm 1.74 cm/m   RA Area:     19.50 cm LA Vol (A2C):   73.1 ml 31.82 ml/m  RA Volume:   53.60 ml  23.33 ml/m LA Vol (A4C):   56.4 ml 24.55 ml/m LA Biplane Vol: 66.7 ml 29.03 ml/m  AORTIC VALVE LVOT Vmax:   125.00 cm/s LVOT Vmean:  93.700 cm/s LVOT VTI:    0.250 m  AORTA Ao Root diam: 3.20 cm Ao Asc diam:  3.90 cm MITRAL VALVE                TRICUSPID VALVE MV Area (PHT): 1.60 cm     TR Peak grad:   30.0 mmHg MV Area VTI:   1.44 cm     TR Vmax:        274.00 cm/s MV Peak grad:  8.3 mmHg MV Mean grad:  4.0 mmHg     SHUNTS MV Vmax:       1.44 m/s     Systemic VTI:  0.25 m MV Vmean:      98.8 cm/s    Systemic Diam: 1.90 cm MV Decel Time: 474 msec MR Peak grad: 97.6 mmHg MR Mean grad: 48.0 mmHg MR Vmax:      494.00 cm/s MR Vmean:     312.0 cm/s MV E velocity: 127.00 cm/s MV A velocity: 106.00 cm/s MV E/A ratio:  1.20 Oswaldo Milian MD Electronically signed by Oswaldo Milian MD Signature Date/Time: 02/25/2021/5:43:55 PM    Final     Cardiac Studies   Transthoracic echocardiogram February 25, 2021 IMPRESSIONS     1. Left ventricular ejection fraction, by estimation, is 50 to 55%. The  left ventricle has low normal function. The left ventricle demonstrates  regional wall motion abnormalities. Basal inferolateral akinesis. There is  mild left ventricular hypertrophy.   Left ventricular diastolic parameters are indeterminate. Poor  visualization of endocardium, consider limited echo with contrast to  better evaluate wall motion abnormalities.   2. Right ventricular systolic function was not well visualized. The right  ventricular size is not well visualized. There is normal pulmonary artery  systolic pressure. The estimated right ventricular systolic pressure is  71.2 mmHg.   3. There is a  prosthetic annuloplasty ring present in the mitral position      Trivial mitral valve regurgitation. Mild to moderate mitral stenosis.  MG 67mmHg at 64bpm, MVA 1.4 cm^2 by continuity equation   4. The aortic valve was not well visualized. Aortic valve regurgitation  is trivial. No aortic stenosis is present.   5. Aortic dilatation noted. There is mild dilatation of the ascending  aorta, measuring 39 mm.   6. The inferior vena cava is normal in size with greater than 50%  respiratory variability, suggesting right atrial pressure of 3 mmHg.   FINDINGS   Left Ventricle: Left ventricular ejection fraction, by estimation, is 50  to 55%. The left ventricle has low normal function. The left ventricle  demonstrates regional wall motion abnormalities. The left ventricular  internal cavity size was normal in  size.  There is mild left ventricular hypertrophy. Left ventricular  diastolic parameters are indeterminate.   Right Ventricle: The right ventricular size is not well visualized. Right  vetricular wall thickness was not well visualized. Right ventricular  systolic function was not well visualized. There is normal pulmonary  artery systolic pressure. The tricuspid  regurgitant velocity is 2.74 m/s, and with an assumed right atrial  pressure of 3 mmHg, the estimated right ventricular systolic pressure is  29.9 mmHg.   Left Atrium: Left atrial size was normal in size.   Right Atrium: Right atrial size was normal in size.   Pericardium: There is no evidence of pericardial effusion.   Mitral Valve: The mitral valve has been repaired/replaced. Trivial mitral  valve regurgitation. There is a prosthetic annuloplasty ring present in  the mitral position. Mild to moderate mitral valve stenosis. MV peak  gradient, 8.3 mmHg. The mean mitral  valve gradient is 4.0 mmHg.   Tricuspid Valve: The tricuspid valve is normal in structure. Tricuspid  valve regurgitation is trivial.   Aortic Valve: The  aortic valve was not well visualized. Aortic valve  regurgitation is trivial. No aortic stenosis is present.   Pulmonic Valve: The pulmonic valve was not well visualized. Pulmonic valve  regurgitation is not visualized.   Aorta: The aortic root is normal in size and structure and aortic  dilatation noted. There is mild dilatation of the ascending aorta,  measuring 39 mm.   Venous: The inferior vena cava is normal in size with greater than 50%  respiratory variability, suggesting right atrial pressure of 3 mmHg.   IAS/Shunts: The interatrial septum was not well visualized.       Patient Profile     65 y.o. female CAD s/p PCI and 4v CABG (2018), history of severe mitral regurgitation status post MV annuloplasty, postop AF, PAD, SLE, GERD, hypothyroidism, obesity and ITP.  Assessment & Plan    Coronary artery disease with chest pressure Shortness of breath History of severe mitral regurgitation status post mitral valve anoplasty Peripheral artery disease Obesity Chronic kidney disease   She has responded well to the diuretics.  Redness of breath is improving but she still does have some chest discomfort and has not subsided.  For now we will keep her on the diuretics.  But I am concerned about her symptoms which appears to be her anginal equivalent in addition there are new wall motion abnormalities that were not seen in 2018.  Therefore with history of heavy burden of coronary artery disease and high risk for progression the clinical next back step is to move forward with a cardiac catheterization in this patient.  I have discussed with the patient about the right and left heart catheterization she is agreeable for this.  The patient understands that risks include but are not limited to stroke (1 in 1000), death (1 in 49), kidney failure [usually temporary] (1 in 500), bleeding (1 in 200), allergic reaction [possibly serious] (1 in 200), and agrees to proceed.  She also does have  chronic kidney disease which her creatinine appears to be at baseline.  We will continue to monitor and gentle hydration for her procedure.   We will continue her current lipid-lowering agents.    For questions or updates, please contact [place] Please consult [email or web address] for contact info under Cardiology/STEMI.      Signed, Hagop Mccollam, DO  02/26/2021, 9:20 AM

## 2021-02-26 NOTE — Interval H&P Note (Signed)
Cath Lab Visit (complete for each Cath Lab visit)  Clinical Evaluation Leading to the Procedure:   ACS: No.  Non-ACS:    Anginal Classification: CCS III  Anti-ischemic medical therapy: Minimal Therapy (1 class of medications)  Non-Invasive Test Results: No non-invasive testing performed  Prior CABG: Previous CABG      History and Physical Interval Note:  02/26/2021 12:58 PM  Rene Paci  has presented today for surgery, with the diagnosis of heart failure.  The various methods of treatment have been discussed with the patient and family. After consideration of risks, benefits and other options for treatment, the patient has consented to  Procedure(s): RIGHT/LEFT HEART CATH AND CORONARY/GRAFT ANGIOGRAPHY (N/A) as a surgical intervention.  The patient's history has been reviewed, patient examined, no change in status, stable for surgery.  I have reviewed the patient's chart and labs.  Questions were answered to the patient's satisfaction.     Belva Crome III

## 2021-02-26 NOTE — Progress Notes (Signed)
Patient ID: [name], female   DOB: [birth year], 65 y.o.   MRN: [id number]  PROGRESS NOTE    Khori Underberg  GUY:[id number] DOB: 1956/03/02 DOA: 02/24/2021 PCP: Janith Lima, MD   Brief Narrative:  65 year old female with history of SLE, CAD status post CABG in 2018, severe mitral regurgitation status post mitral valve annuloplasty, GERD, hypothyroidism, hyperlipidemia, paroxysmal A. fib (postoperatively after CABG in 2018) presented with chest pain.  On presentation, CT of chest did not show pulmonary embolism/pneumonia or obvious pulmonary edema.  EKG showed no dynamic ST segment changes.  Troponins did not trend upwards.  Cardiology was consulted.  Assessment & Plan:   Chest pain in a patient with CAD and CABG -CT of chest did not show pulmonary embolism/pneumonia or obvious pulmonary edema.  EKG showed no dynamic ST segment changes.  Troponins did not trend upwards.   -Cardiology planning for possible cardiac catheterization today -Echo showed EF of 55 to 55% with mild to moderate mitral stenosis.  -Continue aspirin, metoprolol and statin  Exertional shortness of breath -Cardiology has continued her on IV Lasix with concerns for fluid overload.  Probable acute bacterial sinusitis -Continue Augmentin for total of 7 days which was started on 02/22/2021 as an outpatient by PCP for possible bacterial sinusitis  SLE -Continue home Plaquenil and azathioprine  Hyperlipidemia -Continue statin  GERD -Continue PPI  Hypothyroidism -Continue Synthroid  Morbid obesity -Outpatient follow-up    DVT prophylaxis: Lovenox Code Status: Full Family Communication: None at bedside Disposition Plan: Status is: Inpatient  Remains inpatient appropriate because: Of possible need for cardiac catheterization.  Consultants: Cardiology  Procedures: Echo  Antimicrobials: None   Subjective: Patient seen and examined at bedside.  Denies any current chest pain.  Denies any  worsening shortness of breath.  No overnight fever or vomiting reported.  Objective: Vitals:   02/25/21 1553 02/25/21 1956 02/26/21 0039 02/26/21 0454  BP: 113/64 (!) 101/59 102/70 (!) 100/56  Pulse: 60 66 70 65  Resp:  18 18 18   Temp:  98.2 F (36.8 C) 98.7 F (37.1 C) 98 F (36.7 C)  TempSrc:  Oral Oral Oral  SpO2: 96% 95% 96% 95%  Weight:    120.6 kg  Height:        Intake/Output Summary (Last 24 hours) at 02/26/2021 5638 Last data filed at 02/26/2021 0457 Gross per 24 hour  Intake 360 ml  Output 550 ml  Net -190 ml   Filed Weights   02/25/21 0730 02/26/21 0454  Weight: 122.5 kg 120.6 kg    Examination:  General exam: Appears calm and comfortable.  Currently on room air. Respiratory system: Bilateral decreased breath sounds at bases Cardiovascular system: S1 & S2 heard, Rate controlled Gastrointestinal system: Abdomen is morbidly obese, nondistended, soft and nontender. Normal bowel sounds heard. Extremities: No cyanosis, clubbing, edema  Central nervous system: Alert and oriented. No focal neurological deficits. Moving extremities Skin: No rashes, lesions or ulcers Psychiatry: Judgement and insight appear normal. Mood & affect appropriate.     Data Reviewed: I have personally reviewed following labs and imaging studies  CBC: Recent Labs  Lab 02/22/21 1501 02/24/21 1729 02/26/21 0108  WBC 3.0* 3.6* 3.4*  NEUTROABS  --   --  2.2  HGB 12.7 12.8 12.2  HCT 37.8 39.0 36.5  MCV 99.7 102.6* 100.8*  PLT 156 168 756   Basic Metabolic Panel: Recent Labs  Lab 02/22/21 1501 02/24/21 1729 02/25/21 0757 02/26/21 0108  NA 139 140  --  138  K 4.8 3.8  --  3.6  CL 104 104  --  104  CO2 25 27  --  26  GLUCOSE 84 102*  --  91  BUN 10 11  --  13  CREATININE 1.17* 1.22*  --  1.29*  CALCIUM 9.5 9.0  --  8.6*  MG  --   --  2.1 2.1   GFR: Estimated Creatinine Clearance: 58.5 mL/min (A) (by C-G formula based on SCr of 1.29 mg/dL (H)). Liver Function Tests: Recent  Labs  Lab 02/22/21 1501 02/26/21 0108  AST 18 20  ALT 8 12  ALKPHOS  --  66  BILITOT 0.6 0.7  PROT 6.5 6.3*  ALBUMIN  --  3.0*   No results for input(s): LIPASE, AMYLASE in the last 168 hours. No results for input(s): AMMONIA in the last 168 hours. Coagulation Profile: No results for input(s): INR, PROTIME in the last 168 hours. Cardiac Enzymes: No results for input(s): CKTOTAL, CKMB, CKMBINDEX, TROPONINI in the last 168 hours. BNP (last 3 results) Recent Labs    08/08/20 1615  PROBNP 213.0*   HbA1C: Recent Labs    02/25/21 0930  HGBA1C 5.2   CBG: No results for input(s): GLUCAP in the last 168 hours. Lipid Profile: Recent Labs    02/25/21 0757  CHOL 110  HDL 44  LDLCALC 59  TRIG 36  CHOLHDL 2.5   Thyroid Function Tests: No results for input(s): TSH, T4TOTAL, FREET4, T3FREE, THYROIDAB in the last 72 hours. Anemia Panel: No results for input(s): VITAMINB12, FOLATE, FERRITIN, TIBC, IRON, RETICCTPCT in the last 72 hours. Sepsis Labs: No results for input(s): PROCALCITON, LATICACIDVEN in the last 168 hours.  Recent Results (from the past 240 hour(s))  Resp Panel by RT-PCR (Flu A&B, Covid) Nasopharyngeal Swab     Status: None   Collection Time: 02/24/21 11:16 PM   Specimen: Nasopharyngeal Swab; Nasopharyngeal(NP) swabs in vial transport medium  Result Value Ref Range Status   SARS Coronavirus 2 by RT PCR NEGATIVE NEGATIVE Final    Comment: (NOTE) SARS-CoV-2 target nucleic acids are NOT DETECTED.  The SARS-CoV-2 RNA is generally detectable in upper respiratory specimens during the acute phase of infection. The lowest concentration of SARS-CoV-2 viral copies this assay can detect is 138 copies/mL. A negative result does not preclude SARS-Cov-2 infection and should not be used as the sole basis for treatment or other patient management decisions. A negative result may occur with  improper specimen collection/handling, submission of specimen other than  nasopharyngeal swab, presence of viral mutation(s) within the areas targeted by this assay, and inadequate number of viral copies(<138 copies/mL). A negative result must be combined with clinical observations, patient history, and epidemiological information. The expected result is Negative.  Fact Sheet for Patients:  EntrepreneurPulse.com.au  Fact Sheet for Healthcare Providers:  IncredibleEmployment.be  This test is no t yet approved or cleared by the [place] FDA and  has been authorized for detection and/or diagnosis of SARS-CoV-2 by FDA under an Emergency Use Authorization (EUA). This EUA will remain  in effect (meaning this test can be used) for the duration of the COVID-19 declaration under Section 564(b)(1) of the Act, 21 U.S.C.section 360bbb-3(b)(1), unless the authorization is terminated  or revoked sooner.       Influenza A by PCR NEGATIVE NEGATIVE Final   Influenza B by PCR NEGATIVE NEGATIVE Final    Comment: (NOTE) The Xpert Xpress SARS-CoV-2/FLU/RSV plus assay is intended as an aid in the diagnosis of  influenza from Nasopharyngeal swab specimens and should not be used as a sole basis for treatment. Nasal washings and aspirates are unacceptable for Xpert Xpress SARS-CoV-2/FLU/RSV testing.  Fact Sheet for Patients: EntrepreneurPulse.com.au  Fact Sheet for Healthcare Providers: IncredibleEmployment.be  This test is not yet approved or cleared by the [place] FDA and has been authorized for detection and/or diagnosis of SARS-CoV-2 by FDA under an Emergency Use Authorization (EUA). This EUA will remain in effect (meaning this test can be used) for the duration of the COVID-19 declaration under Section 564(b)(1) of the Act, 21 U.S.C. section 360bbb-3(b)(1), unless the authorization is terminated or revoked.  Performed at [facility], [place] [address], [place], [place] [address]           Radiology Studies: DG Chest 2 View  Result Date: 02/24/2021 CLINICAL DATA:  Chest pain radiating to the back and arms with shortness of breath. EXAM: CHEST - 2 VIEW COMPARISON:  February 01, 2018 FINDINGS: Prior median sternotomy, CABG and mitral valve replacement. The heart size and mediastinal contours are within normal limits. Both lungs are clear. Thoracic spondylosis. Surgical clips overlie the upper abdomen. IMPRESSION: No active cardiopulmonary disease. Electronically Signed   By: Dahlia Bailiff M.D.   On: 02/24/2021 19:41   CT Angio Chest PE W/Cm &/Or Wo Cm  Result Date: 02/25/2021 CLINICAL DATA:  Difficulty breathing and positive D-dimer, initial encounter EXAM: CT ANGIOGRAPHY CHEST WITH CONTRAST TECHNIQUE: Multidetector CT imaging of the chest was performed using the standard protocol during bolus administration of intravenous contrast. Multiplanar CT image reconstructions and MIPs were obtained to evaluate the vascular anatomy. CONTRAST:  52mL OMNIPAQUE IOHEXOL 350 MG/ML SOLN COMPARISON:  Chest x-ray from the previous day. FINDINGS: Cardiovascular: Atherosclerotic calcifications of the thoracic aorta are noted. Changes of prior coronary bypass grafting are noted. No aneurysmal dilatation of the aorta is seen. The degree of opacification is limited precluding evaluation for dissection. Heart is enlarged in size. Pulmonary artery shows a normal branching pattern bilaterally. No filling defect to suggest pulmonary embolism is noted. Coronary calcifications are noted. Mediastinum/Nodes: Thoracic inlet is within normal limits. No sizable hilar or mediastinal adenopathy is noted. The esophagus as visualized is within normal limits with the exception of a small sliding-type hiatal hernia. Lungs/Pleura: Areas of mosaic attenuation are noted throughout both lungs consistent with air trapping. Minimal atelectatic changes in the right base are noted. No sizable effusion is seen. No pneumothorax  is noted. Upper Abdomen: Visualized upper abdomen demonstrates changes of prior cholecystectomy. Musculoskeletal: No chest wall abnormality. No acute or significant osseous findings. Review of the MIP images confirms the above findings. IMPRESSION: No evidence of pulmonary emboli. Small sliding-type hiatal hernia. Changes consistent with air trapping.  No focal infiltrate is noted. Aortic Atherosclerosis (ICD10-I70.0). Electronically Signed   By: Inez Catalina M.D.   On: 02/25/2021 01:32   ECHOCARDIOGRAM COMPLETE  Result Date: 02/25/2021    ECHOCARDIOGRAM REPORT   Patient Name:   LIZBET CIRRINCIONE Kiester Date of Exam: 02/25/2021 Medical Rec #:  [id number]          Height:       67.0 in Accession #:    [id number]         Weight:       270.0 lb Date of Birth:  21-Mar-1956         BSA:          2.297 m Patient Age:    9 years  BP:           114/71 mmHg Patient Gender: F                  HR:           64 bpm. Exam Location:  Inpatient Procedure: 2D Echo, Color Doppler and Cardiac Doppler Indications:    R07.9* Chest pain, unspecified  History:        Patient has prior history of Echocardiogram examinations, most                 recent 02/09/2017. Arrythmias:Atrial Fibrillation; Risk                 Factors:Hypertension, Dyslipidemia and Lupus. 02/09/17 CABG and                 Mitral Valve Repair with 73mm Sorin Mitral Memo 3D Ring.  Sonographer:    Raquel Sarna Senior RDCS Referring Phys: [id number] [place]  1. Left ventricular ejection fraction, by estimation, is 50 to 55%. The left ventricle has low normal function. The left ventricle demonstrates regional wall motion abnormalities. Basal inferolateral akinesis. There is mild left ventricular hypertrophy.  Left ventricular diastolic parameters are indeterminate. Poor visualization of endocardium, consider limited echo with contrast to better evaluate wall motion abnormalities.  2. Right ventricular systolic function was not well visualized. The right  ventricular size is not well visualized. There is normal pulmonary artery systolic pressure. The estimated right ventricular systolic pressure is 25.9 mmHg.  3. There is a prosthetic annuloplasty ring present in the mitral position     Trivial mitral valve regurgitation. Mild to moderate mitral stenosis. MG 2mmHg at 64bpm, MVA 1.4 cm^2 by continuity equation  4. The aortic valve was not well visualized. Aortic valve regurgitation is trivial. No aortic stenosis is present.  5. Aortic dilatation noted. There is mild dilatation of the ascending aorta, measuring 39 mm.  6. The inferior vena cava is normal in size with greater than 50% respiratory variability, suggesting right atrial pressure of 3 mmHg. FINDINGS  Left Ventricle: Left ventricular ejection fraction, by estimation, is 50 to 55%. The left ventricle has low normal function. The left ventricle demonstrates regional wall motion abnormalities. The left ventricular internal cavity size was normal in size. There is mild left ventricular hypertrophy. Left ventricular diastolic parameters are indeterminate. Right Ventricle: The right ventricular size is not well visualized. Right vetricular wall thickness was not well visualized. Right ventricular systolic function was not well visualized. There is normal pulmonary artery systolic pressure. The tricuspid regurgitant velocity is 2.74 m/s, and with an assumed right atrial pressure of 3 mmHg, the estimated right ventricular systolic pressure is 56.3 mmHg. Left Atrium: Left atrial size was normal in size. Right Atrium: Right atrial size was normal in size. Pericardium: There is no evidence of pericardial effusion. Mitral Valve: The mitral valve has been repaired/replaced. Trivial mitral valve regurgitation. There is a prosthetic annuloplasty ring present in the mitral position. Mild to moderate mitral valve stenosis. MV peak gradient, 8.3 mmHg. The mean mitral valve gradient is 4.0 mmHg. Tricuspid Valve: The tricuspid  valve is normal in structure. Tricuspid valve regurgitation is trivial. Aortic Valve: The aortic valve was not well visualized. Aortic valve regurgitation is trivial. No aortic stenosis is present. Pulmonic Valve: The pulmonic valve was not well visualized. Pulmonic valve regurgitation is not visualized. Aorta: The aortic root is normal in size and structure and aortic dilatation noted. There is mild dilatation  of the ascending aorta, measuring 39 mm. Venous: The inferior vena cava is normal in size with greater than 50% respiratory variability, suggesting right atrial pressure of 3 mmHg. IAS/Shunts: The interatrial septum was not well visualized.  LEFT VENTRICLE PLAX 2D LVIDd:         4.70 cm LVIDs:         4.10 cm LV PW:         0.60 cm LV IVS:        1.10 cm LVOT diam:     1.90 cm LV SV:         71 LV SV Index:   31 LVOT Area:     2.84 cm  RIGHT VENTRICLE RV S prime:     11.70 cm/s TAPSE (M-mode): 2.0 cm LEFT ATRIUM             Index        RIGHT ATRIUM           Index LA diam:        4.00 cm 1.74 cm/m   RA Area:     19.50 cm LA Vol (A2C):   73.1 ml 31.82 ml/m  RA Volume:   53.60 ml  23.33 ml/m LA Vol (A4C):   56.4 ml 24.55 ml/m LA Biplane Vol: 66.7 ml 29.03 ml/m  AORTIC VALVE LVOT Vmax:   125.00 cm/s LVOT Vmean:  93.700 cm/s LVOT VTI:    0.250 m  AORTA Ao Root diam: 3.20 cm Ao Asc diam:  3.90 cm MITRAL VALVE                TRICUSPID VALVE MV Area (PHT): 1.60 cm     TR Peak grad:   30.0 mmHg MV Area VTI:   1.44 cm     TR Vmax:        274.00 cm/s MV Peak grad:  8.3 mmHg MV Mean grad:  4.0 mmHg     SHUNTS MV Vmax:       1.44 m/s     Systemic VTI:  0.25 m MV Vmean:      98.8 cm/s    Systemic Diam: 1.90 cm MV Decel Time: 474 msec MR Peak grad: 97.6 mmHg MR Mean grad: 48.0 mmHg MR Vmax:      494.00 cm/s MR Vmean:     312.0 cm/s MV E velocity: 127.00 cm/s MV A velocity: 106.00 cm/s MV E/A ratio:  1.20 Oswaldo Milian MD Electronically signed by Oswaldo Milian MD Signature Date/Time:  02/25/2021/5:43:55 PM    Final         Scheduled Meds:  amoxicillin-clavulanate  1 tablet Oral BID   aspirin  325 mg Oral Daily   azaTHIOprine  50 mg Oral BID   enoxaparin (LOVENOX) injection  40 mg Subcutaneous Daily   furosemide  40 mg Intravenous BID   hydroxychloroquine  200 mg Oral Daily   levothyroxine  75 mcg Oral Q0600   metoprolol succinate  50 mg Oral Daily   pantoprazole  40 mg Oral Daily   rosuvastatin  20 mg Oral Daily   Continuous Infusions:        [name] August, MD [facility] 02/26/2021, 7:12 AM

## 2021-02-27 ENCOUNTER — Other Ambulatory Visit (HOSPITAL_COMMUNITY): Payer: Self-pay

## 2021-02-27 ENCOUNTER — Encounter (HOSPITAL_COMMUNITY): Payer: Self-pay | Admitting: Interventional Cardiology

## 2021-02-27 LAB — BASIC METABOLIC PANEL
Anion gap: 10 (ref 5–15)
BUN: 16 mg/dL (ref 8–23)
CO2: 27 mmol/L (ref 22–32)
Calcium: 8.5 mg/dL — ABNORMAL LOW (ref 8.9–10.3)
Chloride: 102 mmol/L (ref 98–111)
Creatinine, Ser: 1.45 mg/dL — ABNORMAL HIGH (ref 0.44–1.00)
GFR, Estimated: 40 mL/min — ABNORMAL LOW (ref >60)
Glucose, Bld: 82 mg/dL (ref 70–99)
Potassium: 3.7 mmol/L (ref 3.5–5.1)
Sodium: 139 mmol/L (ref 135–145)

## 2021-02-27 LAB — MAGNESIUM: Magnesium: 2.2 mg/dL (ref 1.7–2.4)

## 2021-02-27 MED ORDER — NITROGLYCERIN 0.4 MG SL SUBL
0.4000 mg | SUBLINGUAL_TABLET | SUBLINGUAL | 0 refills | Status: DC | PRN
  Filled 2021-02-27: qty 25, 7d supply, fill #0

## 2021-02-27 MED ORDER — ISOSORBIDE MONONITRATE ER 30 MG PO TB24
30.0000 mg | ORAL_TABLET | Freq: Every day | ORAL | 0 refills | Status: DC
Start: 2021-02-28 — End: 2021-03-27
  Filled 2021-02-27: qty 30, 30d supply, fill #0

## 2021-02-27 MED ORDER — POTASSIUM CHLORIDE CRYS ER 20 MEQ PO TBCR
40.0000 meq | EXTENDED_RELEASE_TABLET | Freq: Once | ORAL | Status: AC
  Administered 2021-02-27: 40 meq via ORAL
  Filled 2021-02-27: qty 2

## 2021-02-27 MED ORDER — POTASSIUM CHLORIDE CRYS ER 20 MEQ PO TBCR
20.0000 meq | EXTENDED_RELEASE_TABLET | Freq: Every day | ORAL | 0 refills | Status: AC
  Filled 2021-02-27: qty 30, 30d supply, fill #0

## 2021-02-27 MED ORDER — FUROSEMIDE 20 MG PO TABS
20.0000 mg | ORAL_TABLET | Freq: Every day | ORAL | 0 refills | Status: DC
  Filled 2021-02-27: qty 30, 30d supply, fill #0

## 2021-02-27 MED ORDER — ISOSORBIDE MONONITRATE ER 30 MG PO TB24: 30.0000 mg | ORAL_TABLET | Freq: Every day | ORAL | Status: DC

## 2021-02-27 MED ORDER — FUROSEMIDE 20 MG PO TABS: 20.0000 mg | ORAL_TABLET | Freq: Every day | ORAL | Status: DC

## 2021-02-27 MED ORDER — OXYCODONE HCL 5 MG PO TABS
5.0000 mg | ORAL_TABLET | ORAL | 0 refills | Status: DC | PRN
  Filled 2021-02-27: qty 5, 1d supply, fill #0

## 2021-02-27 MED ORDER — DAPAGLIFLOZIN PROPANEDIOL 10 MG PO TABS
10.0000 mg | ORAL_TABLET | Freq: Every day | ORAL | Status: DC
  Administered 2021-02-27: 10 mg via ORAL
  Filled 2021-02-27: qty 1

## 2021-02-27 MED ORDER — DAPAGLIFLOZIN PROPANEDIOL 10 MG PO TABS
10.0000 mg | ORAL_TABLET | Freq: Every day | ORAL | 0 refills | Status: DC
  Filled 2021-02-27: qty 30, 30d supply, fill #0

## 2021-02-27 MED ORDER — ASPIRIN 81 MG PO CHEW: 81.0000 mg | CHEWABLE_TABLET | Freq: Every day | ORAL | Status: DC

## 2021-02-27 MED ORDER — CYCLOBENZAPRINE HCL 5 MG PO TABS
5.0000 mg | ORAL_TABLET | Freq: Three times a day (TID) | ORAL | 0 refills | Status: DC | PRN
  Filled 2021-02-27: qty 30, 10d supply, fill #0

## 2021-02-27 MED ORDER — LIDOCAINE 5 % EX PTCH
1.0000 | MEDICATED_PATCH | Freq: Two times a day (BID) | CUTANEOUS | 0 refills | Status: AC
  Filled 2021-02-27: qty 10, 5d supply, fill #0

## 2021-02-27 NOTE — Discharge Summary (Signed)
Discharge Summary  [name] SJG:[id number] DOB: March 15, 1956  PCP: Janith Lima, MD  Admit date: 02/24/2021 Discharge date: 02/27/2021  Time spent: 67mins, more than 50% time spent on coordination of care.  Case discussed with cardiology   Recommendations for Outpatient Follow-up:  F/u with PCP within a week for hospital discharge follow up, repeat cbc/bmp at follow up F/u with cardiology on 12/15  Outpatient PT referral made for right upper back pain  Discharge Diagnoses:  Active Hospital Problems   Diagnosis Date Noted   Coronary artery disease involving coronary bypass graft of native heart with angina pectoris ([name]) 02/26/2009   Systemic lupus erythematosus ([name]) 02/25/2021   Stage 3a chronic kidney disease ([name])    Chest pain 02/01/2018   S/P MVR (mitral valve repair) 01/30/2017   S/P CABG x 4 01/30/2017   Exertional shortness of breath 08/19/2011   Hypothyroidism 04/08/2011   Mixed hyperlipidemia 01/26/2009   GERD without esophagitis 08/05/2007   Immune thrombocytopenic purpura ([name]) 08/05/2007    Resolved Hospital Problems   Diagnosis Date Noted Date Resolved   Acute bacterial sinusitis 05/29/2009 02/25/2021    Discharge Condition: stable  Diet recommendation: heart healthy  Filed Weights   02/25/21 0730 02/26/21 0454 02/27/21 0530  Weight: 122.5 kg 120.6 kg 119.7 kg    History of present illness: ( per admitting MD Dr Cyd Silence) 65 year old female with past medical history of systemic lupus erythematosus, coronary artery disease status post MI in 2003, status post CABG 2018, severe mitral regurgitation status post mitral valve annuloplasty 01/2017 simultaneously with CABG, gastroesophageal reflux disease, hypothyroidism, hyperlipidemia, history of atrial fibrillation (postoperative after CABG 2018) who presents to [facility] emergency department with complaints of chest discomfort.   Patient explains that approximately 1 week ago she began to  experience episodes of chest discomfort.  Patient describes these episodes of chest discomfort as intermittent, begetting in the midsternal region and radiating to the back and right arm.  Episodes seem to occur with exertion and are also associated with mild to moderate shortness of breath.  Patient explains that she is becoming short of breath while only going up 1 flight of stairs.  Patient is now also experiencing associated increasing bilateral lower extremity swelling beyond her baseline.   Upon further questioning patient denies paroxysmal nocturnal dyspnea, pillow orthopnea, fever, cough, sick contacts, recent travel or contact with confirmed COVID-19 infection.   Patient's symptoms seem to gradually worsen until the patient presented to [facility] emergency department for evaluation.  Upon evaluation in the emergency department patient underwent CT angiogram of the chest which revealed no evidence of pulmonary embolism or pneumonia or obvious pulmonary edema.  EKG performed revealed no evidence of dynamic ST segment change.  Troponins were performed and found not to be elevated.  ER provider discussed case with Dr. Renella Cunas with cardiology who came and evaluated the patient at the bedside and recommended treating the patient for suspected acute congestive heart failure with echocardiogram to be performed in the morning and cardiology team to follow-up with potential for cardiac catheterization later in the hospitalization.  The hospitalist group was then called to assess the patient for admission the hospital.    Hospital Course:  Principal Problem:   Coronary artery disease involving coronary bypass graft of native heart with angina pectoris [facility]) Active Problems:   Mixed hyperlipidemia   Immune thrombocytopenic purpura ([name])   GERD without esophagitis   Hypothyroidism   Exertional shortness of breath   S/P CABG  x 4   S/P MVR (mitral valve repair)   Chest pain   Systemic lupus  erythematosus ([name])   Stage 3a chronic kidney disease (HCC)   Chest pain in a patient with CAD and CABG -CT of chest did not show pulmonary embolism/pneumonia or obvious pulmonary edema.  EKG showed no dynamic ST segment changes.  Troponins did not trend upwards.   -s/p  cardiac catheterization without pCI on 12/6, medication adjusted by cardiology, she is cleared to discharge home with close cardiology follow up -Echo showed EF of 55 to 55% with mild to moderate mitral stenosis.   Cards meds at discharge:-Continue aspirin, metoprolol and statin Start imdur, farxiga, lasix  Acute on chronic diastolic CHF She received iv lasix, feeling better, seen by cardiology, she is advised to discharge home on lasix 20mg  daily with with potassium supplement.  Please add Farxiga 10 mg daily to her medication regimen as well.     Probable acute bacterial sinusitis -Continue Augmentin for total of 7 days which was started on 02/22/2021 as an outpatient by PCP for possible bacterial sinusitis, continue to finish course of abx   SLE -Continue home Plaquenil and azathioprine   Hyperlipidemia -Continue statin   GERD -Continue PPI   Hypothyroidism -Continue Synthroid   Morbid obesity -Outpatient follow-up     Right upper back muscle spasms Refer to PT Topical lidocaine patch Oxycodone/Flexeril  prn  Procedures: Cardiac cath on 12/6  Consultations: cardiology  Discharge Exam: BP 110/73 (BP Location: Right Arm)   Pulse (!) 58   Temp 97.9 F (36.6 C)   Resp 18   Ht 5\' 7"  (1.702 m)   Wt 119.7 kg Comment: scale b  SpO2 92%   BMI 41.32 kg/m   General: NAD Cardiovascular: RRR Respiratory: normal respiratory effort   Discharge Instructions You were cared for by a hospitalist during your hospital stay. If you have any questions about your discharge medications or the care you received while you were in the hospital after you are discharged, you can call the unit and asked to speak  with the hospitalist on call if the hospitalist that took care of you is not available. Once you are discharged, your primary care physician will handle any further medical issues. Please note that NO REFILLS for any discharge medications will be authorized once you are discharged, as it is imperative that you return to your primary care physician (or establish a relationship with a primary care physician if you do not have one) for your aftercare needs so that they can reassess your need for medications and monitor your lab values.  Discharge Instructions     Ambulatory referral to Physical Therapy   Complete by: As directed    Right upper back pain   Diet - low sodium heart healthy   Complete by: As directed    Discharge instructions   Complete by: As directed    Please weight yourself daily, check your legs daily, please call your heart doctor if your weight goes up by 3lbs in three days or your have more swelling in your legs   Increase activity slowly   Complete by: As directed       Allergies as of 02/27/2021       Reactions   Amlodipine Swelling   Leg edema   Cellcept [mycophenolate] Nausea Only        Medication List     STOP taking these medications    amoxicillin-clavulanate 875-125 MG tablet Commonly known  as: AUGMENTIN   indapamide 1.25 MG tablet Commonly known as: LOZOL       TAKE these medications    albuterol 108 (90 Base) MCG/ACT inhaler Commonly known as: VENTOLIN HFA Inhale 2 puffs into the lungs every 6 (six) hours as needed for wheezing or shortness of breath.   aspirin 81 MG chewable tablet Chew 1 tablet (81 mg total) by mouth daily. Start taking on: February 28, 2021   azaTHIOprine 50 MG tablet Commonly known as: IMURAN TAKE 1 TABLET BY MOUTH TWICE A DAY What changed: when to take this   cyclobenzaprine 5 MG tablet Commonly known as: FLEXERIL Take 1 tablet (5 mg total) by mouth 3 (three) times daily as needed.   dapagliflozin propanediol  10 MG Tabs tablet Commonly known as: FARXIGA Take 1 tablet (10 mg total) by mouth daily.   furosemide 20 MG tablet Commonly known as: LASIX Take 1 tablet (20 mg total) by mouth daily.   hydroxychloroquine 200 MG tablet Commonly known as: PLAQUENIL Take 200 mg by mouth daily.   isosorbide mononitrate 30 MG 24 hr tablet Commonly known as: IMDUR Take 1 tablet (30 mg total) by mouth daily. Start taking on: February 28, 2021   levothyroxine 75 MCG tablet Commonly known as: SYNTHROID TAKE 1 TABLET BY MOUTH EVERY DAY What changed: when to take this   lidocaine 5 % Commonly known as: Lidoderm Place 1 patch onto the skin every 12 (twelve) hours for 5 days. Remove & Discard patch within 12 hours or as directed by MD   metoprolol succinate 50 MG 24 hr tablet Commonly known as: TOPROL-XL TAKE 1 TABLET (50 MG TOTAL) BY MOUTH DAILY. KEEP FOLLOW UP FOR FUTURE REFILLS. What changed: additional instructions   nitroGLYCERIN 0.4 MG SL tablet Commonly known as: NITROSTAT Place 1 tablet (0.4 mg total) under the tongue every 5 (five) minutes as needed for chest pain.   oxyCODONE 5 MG immediate release tablet Commonly known as: Oxy IR/ROXICODONE Take 1 tablet (5 mg total) by mouth every 4 (four) hours as needed for moderate pain.   pantoprazole 40 MG tablet Commonly known as: PROTONIX TAKE 1 TABLET BY MOUTH EVERY DAY What changed: how to take this   potassium chloride SA 20 MEQ tablet Commonly known as: KLOR-CON M Take 1 tablet (20 mEq total) by mouth daily.   rosuvastatin 20 MG tablet Commonly known as: CRESTOR Take 1 tablet (20 mg total) by mouth daily. Please keep upcoming appt for future refills. What changed: additional instructions       Allergies  Allergen Reactions   Amlodipine Swelling    Leg edema   Cellcept [Mycophenolate] Nausea Only    Follow-up Information     Janith Lima, MD Follow up on 04/03/2021.   Specialty: Internal Medicine Why: hospital discharge  follow up, repeat basic metabolic panel, monitor electrolytes , blood glucose and kidney function@10 :20am Contact information: [place] [address] [phone]         Belva Crome, MD Follow up.   Specialty: Cardiology Why: please call to confirm follow up appintment Contact information: [address] [place] [address] [phone]                  The results of significant diagnostics from this hospitalization (including imaging, microbiology, ancillary and laboratory) are listed below for reference.    Significant Diagnostic Studies: DG Chest 2 View  Result Date: 02/24/2021 CLINICAL DATA:  Chest pain radiating  to the back and arms with shortness of breath. EXAM: CHEST - 2 VIEW COMPARISON:  February 01, 2018 FINDINGS: Prior median sternotomy, CABG and mitral valve replacement. The heart size and mediastinal contours are within normal limits. Both lungs are clear. Thoracic spondylosis. Surgical clips overlie the upper abdomen. IMPRESSION: No active cardiopulmonary disease. Electronically Signed   By: Dahlia Bailiff M.D.   On: 02/24/2021 19:41   CT Angio Chest PE W/Cm &/Or Wo Cm  Result Date: 02/25/2021 CLINICAL DATA:  Difficulty breathing and positive D-dimer, initial encounter EXAM: CT ANGIOGRAPHY CHEST WITH CONTRAST TECHNIQUE: Multidetector CT imaging of the chest was performed using the standard protocol during bolus administration of intravenous contrast. Multiplanar CT image reconstructions and MIPs were obtained to evaluate the vascular anatomy. CONTRAST:  13mL OMNIPAQUE IOHEXOL 350 MG/ML SOLN COMPARISON:  Chest x-ray from the previous day. FINDINGS: Cardiovascular: Atherosclerotic calcifications of the thoracic aorta are noted. Changes of prior coronary bypass grafting are noted. No aneurysmal dilatation of the aorta is seen. The degree of opacification is limited precluding evaluation for dissection. Heart is enlarged in size.  Pulmonary artery shows a normal branching pattern bilaterally. No filling defect to suggest pulmonary embolism is noted. Coronary calcifications are noted. Mediastinum/Nodes: Thoracic inlet is within normal limits. No sizable hilar or mediastinal adenopathy is noted. The esophagus as visualized is within normal limits with the exception of a small sliding-type hiatal hernia. Lungs/Pleura: Areas of mosaic attenuation are noted throughout both lungs consistent with air trapping. Minimal atelectatic changes in the right base are noted. No sizable effusion is seen. No pneumothorax is noted. Upper Abdomen: Visualized upper abdomen demonstrates changes of prior cholecystectomy. Musculoskeletal: No chest wall abnormality. No acute or significant osseous findings. Review of the MIP images confirms the above findings. IMPRESSION: No evidence of pulmonary emboli. Small sliding-type hiatal hernia. Changes consistent with air trapping.  No focal infiltrate is noted. Aortic Atherosclerosis (ICD10-I70.0). Electronically Signed   By: Inez Catalina M.D.   On: 02/25/2021 01:32   CARDIAC CATHETERIZATION  Result Date: 02/26/2021 CONCLUSIONS: Total occlusion of the proximal native RCA which receives collaterals from the LAD and left circumflex territory. Patent left main Focal 90% stenosis in the proximal LAD.  LAD fills competitively because of patent LIMA to the mid vessel. Diffuse disease in the proximal and mid circumflex with patent saphenous vein graft to the 2 large obtuse marginal branches. Widely patent LIMA to LAD. Widely patent sequential saphenous vein graft to the first and second obtuse marginal branches. Total occlusion at aorto-ostial junction, appearing chronic. RECOMMENDATIONS: Consider adding long-acting nitrates. Consider adding Jardiance or Farxiga for presumed diastolic heart failure.   ECHOCARDIOGRAM COMPLETE  Result Date: 02/25/2021    ECHOCARDIOGRAM REPORT   Patient Name:   [name] [place] Date of  Exam: 02/25/2021 Medical Rec #:  [id number]          Height:       67.0 in Accession #:    [id number]         Weight:       270.0 lb Date of Birth:  1955-04-15         BSA:          2.297 m Patient Age:    65 years           BP:           114/71 mmHg Patient Gender: F                  HR:  64 bpm. Exam Location:  Inpatient Procedure: 2D Echo, Color Doppler and Cardiac Doppler Indications:    R07.9* Chest pain, unspecified  History:        Patient has prior history of Echocardiogram examinations, most                 recent 02/09/2017. Arrythmias:Atrial Fibrillation; Risk                 Factors:Hypertension, Dyslipidemia and Lupus. 02/09/17 CABG and                 Mitral Valve Repair with 3mm Sorin Mitral Memo 3D Ring.  Sonographer:    Raquel Sarna Senior RDCS Referring Phys: [id number] House  1. Left ventricular ejection fraction, by estimation, is 50 to 55%. The left ventricle has low normal function. The left ventricle demonstrates regional wall motion abnormalities. Basal inferolateral akinesis. There is mild left ventricular hypertrophy.  Left ventricular diastolic parameters are indeterminate. Poor visualization of endocardium, consider limited echo with contrast to better evaluate wall motion abnormalities.  2. Right ventricular systolic function was not well visualized. The right ventricular size is not well visualized. There is normal pulmonary artery systolic pressure. The estimated right ventricular systolic pressure is 24.0 mmHg.  3. There is a prosthetic annuloplasty ring present in the mitral position     Trivial mitral valve regurgitation. Mild to moderate mitral stenosis. MG 72mmHg at 64bpm, MVA 1.4 cm^2 by continuity equation  4. The aortic valve was not well visualized. Aortic valve regurgitation is trivial. No aortic stenosis is present.  5. Aortic dilatation noted. There is mild dilatation of the ascending aorta, measuring 39 mm.  6. The inferior vena cava is normal in size  with greater than 50% respiratory variability, suggesting right atrial pressure of 3 mmHg. FINDINGS  Left Ventricle: Left ventricular ejection fraction, by estimation, is 50 to 55%. The left ventricle has low normal function. The left ventricle demonstrates regional wall motion abnormalities. The left ventricular internal cavity size was normal in size. There is mild left ventricular hypertrophy. Left ventricular diastolic parameters are indeterminate. Right Ventricle: The right ventricular size is not well visualized. Right vetricular wall thickness was not well visualized. Right ventricular systolic function was not well visualized. There is normal pulmonary artery systolic pressure. The tricuspid regurgitant velocity is 2.74 m/s, and with an assumed right atrial pressure of 3 mmHg, the estimated right ventricular systolic pressure is 97.3 mmHg. Left Atrium: Left atrial size was normal in size. Right Atrium: Right atrial size was normal in size. Pericardium: There is no evidence of pericardial effusion. Mitral Valve: The mitral valve has been repaired/replaced. Trivial mitral valve regurgitation. There is a prosthetic annuloplasty ring present in the mitral position. Mild to moderate mitral valve stenosis. MV peak gradient, 8.3 mmHg. The mean mitral valve gradient is 4.0 mmHg. Tricuspid Valve: The tricuspid valve is normal in structure. Tricuspid valve regurgitation is trivial. Aortic Valve: The aortic valve was not well visualized. Aortic valve regurgitation is trivial. No aortic stenosis is present. Pulmonic Valve: The pulmonic valve was not well visualized. Pulmonic valve regurgitation is not visualized. Aorta: The aortic root is normal in size and structure and aortic dilatation noted. There is mild dilatation of the ascending aorta, measuring 39 mm. Venous: The inferior vena cava is normal in size with greater than 50% respiratory variability, suggesting right atrial pressure of 3 mmHg. IAS/Shunts: The  interatrial septum was not well visualized.  LEFT VENTRICLE PLAX 2D LVIDd:  4.70 cm LVIDs:         4.10 cm LV PW:         0.60 cm LV IVS:        1.10 cm LVOT diam:     1.90 cm LV SV:         71 LV SV Index:   31 LVOT Area:     2.84 cm  RIGHT VENTRICLE RV S prime:     11.70 cm/s TAPSE (M-mode): 2.0 cm LEFT ATRIUM             Index        RIGHT ATRIUM           Index LA diam:        4.00 cm 1.74 cm/m   RA Area:     19.50 cm LA Vol (A2C):   73.1 ml 31.82 ml/m  RA Volume:   53.60 ml  23.33 ml/m LA Vol (A4C):   56.4 ml 24.55 ml/m LA Biplane Vol: 66.7 ml 29.03 ml/m  AORTIC VALVE LVOT Vmax:   125.00 cm/s LVOT Vmean:  93.700 cm/s LVOT VTI:    0.250 m  AORTA Ao Root diam: 3.20 cm Ao Asc diam:  3.90 cm MITRAL VALVE                TRICUSPID VALVE MV Area (PHT): 1.60 cm     TR Peak grad:   30.0 mmHg MV Area VTI:   1.44 cm     TR Vmax:        274.00 cm/s MV Peak grad:  8.3 mmHg MV Mean grad:  4.0 mmHg     SHUNTS MV Vmax:       1.44 m/s     Systemic VTI:  0.25 m MV Vmean:      98.8 cm/s    Systemic Diam: 1.90 cm MV Decel Time: 474 msec MR Peak grad: 97.6 mmHg MR Mean grad: 48.0 mmHg MR Vmax:      494.00 cm/s MR Vmean:     312.0 cm/s MV E velocity: 127.00 cm/s MV A velocity: 106.00 cm/s MV E/A ratio:  1.20 Oswaldo Milian MD Electronically signed by Oswaldo Milian MD Signature Date/Time: 02/25/2021/5:43:55 PM    Final     Microbiology: Recent Results (from the past 240 hour(s))  Resp Panel by RT-PCR (Flu A&B, Covid) Nasopharyngeal Swab     Status: None   Collection Time: 02/24/21 11:16 PM   Specimen: Nasopharyngeal Swab; Nasopharyngeal(NP) swabs in vial transport medium  Result Value Ref Range Status   SARS Coronavirus 2 by RT PCR NEGATIVE NEGATIVE Final    Comment: (NOTE) SARS-CoV-2 target nucleic acids are NOT DETECTED.  The SARS-CoV-2 RNA is generally detectable in upper respiratory specimens during the acute phase of infection. The lowest concentration of SARS-CoV-2 viral copies this  assay can detect is 138 copies/mL. A negative result does not preclude SARS-Cov-2 infection and should not be used as the sole basis for treatment or other patient management decisions. A negative result may occur with  improper specimen collection/handling, submission of specimen other than nasopharyngeal swab, presence of viral mutation(s) within the areas targeted by this assay, and inadequate number of viral copies(<138 copies/mL). A negative result must be combined with clinical observations, patient history, and epidemiological information. The expected result is Negative.  Fact Sheet for Patients:  EntrepreneurPulse.com.au  Fact Sheet for Healthcare Providers:  IncredibleEmployment.be  This test is no t yet approved or cleared by the [place] FDA  and  has been authorized for detection and/or diagnosis of SARS-CoV-2 by FDA under an Emergency Use Authorization (EUA). This EUA will remain  in effect (meaning this test can be used) for the duration of the COVID-19 declaration under Section 564(b)(1) of the Act, 21 U.S.C.section 360bbb-3(b)(1), unless the authorization is terminated  or revoked sooner.       Influenza A by PCR NEGATIVE NEGATIVE Final   Influenza B by PCR NEGATIVE NEGATIVE Final    Comment: (NOTE) The Xpert Xpress SARS-CoV-2/FLU/RSV plus assay is intended as an aid in the diagnosis of influenza from Nasopharyngeal swab specimens and should not be used as a sole basis for treatment. Nasal washings and aspirates are unacceptable for Xpert Xpress SARS-CoV-2/FLU/RSV testing.  Fact Sheet for Patients: EntrepreneurPulse.com.au  Fact Sheet for Healthcare Providers: IncredibleEmployment.be  This test is not yet approved or cleared by the [place] FDA and has been authorized for detection and/or diagnosis of SARS-CoV-2 by FDA under an Emergency Use Authorization (EUA). This EUA will  remain in effect (meaning this test can be used) for the duration of the COVID-19 declaration under Section 564(b)(1) of the Act, 21 U.S.C. section 360bbb-3(b)(1), unless the authorization is terminated or revoked.  Performed at [facility], [place] [address], [place], [place] [address]      Labs: Basic Metabolic Panel: Recent Labs  Lab 02/22/21 1501 02/24/21 1729 02/25/21 0757 02/26/21 0108 02/26/21 1342 02/26/21 1351 02/27/21 0114  NA 139 140  --  138 141 140 139  K 4.8 3.8  --  3.6 3.8 3.8 3.7  CL 104 104  --  104  --   --  102  CO2 25 27  --  26  --   --  27  GLUCOSE 84 102*  --  91  --   --  82  BUN 10 11  --  13  --   --  16  CREATININE 1.17* 1.22*  --  1.29*  --   --  1.45*  CALCIUM 9.5 9.0  --  8.6*  --   --  8.5*  MG  --   --  2.1 2.1  --   --  2.2   Liver Function Tests: Recent Labs  Lab 02/22/21 1501 02/26/21 0108  AST 18 20  ALT 8 12  ALKPHOS  --  66  BILITOT 0.6 0.7  PROT 6.5 6.3*  ALBUMIN  --  3.0*   No results for input(s): LIPASE, AMYLASE in the last 168 hours. No results for input(s): AMMONIA in the last 168 hours. CBC: Recent Labs  Lab 02/22/21 1501 02/24/21 1729 02/26/21 0108 02/26/21 1342 02/26/21 1351  WBC 3.0* 3.6* 3.4*  --   --   NEUTROABS  --   --  2.2  --   --   HGB 12.7 12.8 12.2 12.9 12.9  HCT 37.8 39.0 36.5 38.0 38.0  MCV 99.7 102.6* 100.8*  --   --   PLT 156 168 160  --   --    Cardiac Enzymes: No results for input(s): CKTOTAL, CKMB, CKMBINDEX, TROPONINI in the last 168 hours. BNP: BNP (last 3 results) Recent Labs    02/22/21 1501 02/25/21 0757  BNP 578* 310.9*    ProBNP (last 3 results) Recent Labs    08/08/20 1615  PROBNP 213.0*    CBG: No results for input(s): GLUCAP in the last 168 hours.     Signed:  Florencia Reasons MD, PhD, FACP  [facility] 02/27/2021,  2:00 PM

## 2021-02-27 NOTE — Progress Notes (Addendum)
Heart Failure Stewardship Pharmacist Progress Note   PCP: Janith Lima, MD PCP-Cardiologist: Sinclair Grooms, MD    HPI:  65 yo F with PMH of CAD s/p PCI and CABG (2018), severe mitral regurgitation status post MV annuloplasty, afib, PAD, lupus, GERD, hypothyroidism, obesity and ITP. She presented to the ED on 12/4 with chest pain and shortness of breath. CXR without active cardiopulmonary disease. An ECHO was done on 12/5 and LVEF 50-55% (55-60% in 01/2017, 50-55% in 07/2016, 45-50% in 08/2014). R/LHC done on 12/6 and recommending antianginals for CAD and found to have mild pulmonary hypertension.   Current HF Medications: Diuretic: furosemide 40 mg IV BID  Beta Blocker: metoprolol XL 50 mg daily  Prior to admission HF Medications: Beta blocker: metoprolol XL 50 mg daily  Pertinent Lab Values: Serum creatinine 1.45, BUN 16, Potassium 3.7, Sodium 139, BNP 310.9, Magnesium 2.2, A1c 5.2   Vital Signs: Weight: 263 lbs (admission weight: 265 lbs) Blood pressure: 110/70s  Heart rate: 60s  I/O: -1.7L yesterday; net -1.4L  Medication Assistance / Insurance Benefits Check: Does the patient have prescription insurance?  Yes Type of insurance plan: [place]:  [facility] Is the patient willing to use [facility] at discharge? Yes Is the patient willing to transition their outpatient pharmacy to utilize a [facility] outpatient pharmacy?   Pending    Assessment: 1. Acute on chronic diastolic CHF (EF 15-83%). NYHA class II symptoms. - Continue furosemide 40 mg IV BID - Continue metoprolol XL 50 mg daily - Can consider adding Entresto for HFpEF optimization as outpatient - Consider adding Farxiga 10 mg daily for HFpEF optimization   Plan: 1) Medication changes recommended at this time: - Add Farxiga 10 mg daily  2) Patient assistance: - Entresto copay [phone] (has not met deductible yet) - can utilize monthly copay  cards to lower to $10 per fill (1-3 month) - Farxiga/Jardiance require prior authorization - can complete this if added to HF regimen  3)  Education  - Patient has been educated on current HF medications and potential additions to HF medication regimen - Patient verbalizes understanding that over the next few months, these medication doses may change and more medications may be added to optimize HF regimen - Patient has been educated on basic disease state pathophysiology and goals of therapy    Kerby Nora, PharmD, BCPS Heart Failure Stewardship Pharmacist Phone [phone]

## 2021-02-27 NOTE — Progress Notes (Addendum)
Heart Failure Patient Advocate Encounter   Received notification from [place] that prior authorization for Wilder Glade is required.   PA submitted on CoverMyMeds Key [id number] Status is approved through 02/27/2024   Copay $0 per month  Kerby Nora, PharmD, BCPS Heart Failure Stewardship Pharmacist Phone [phone]  Please check [email or web address] for unit-specific pharmacist phone numbers

## 2021-02-27 NOTE — Progress Notes (Signed)
Progress Note  Patient Name: [name] Date of Encounter: 02/27/2021  Primary Cardiologist: Meghan Grooms, MD   Subjective   Patient seen examined at her bedside.  She is sitting up in bed when I arrive eating her breakfast.  She had some questions that we talked about about her left heart catheterization yesterday.  Medication adjustments as well as exercise at home.  No new complaints at this time.  Inpatient Medications    Scheduled Meds:  amoxicillin-clavulanate  1 tablet Oral BID   aspirin  81 mg Oral Daily   azaTHIOprine  50 mg Oral BID   enoxaparin (LOVENOX) injection  40 mg Subcutaneous Q24H   furosemide  40 mg Intravenous BID   hydroxychloroquine  200 mg Oral Daily   levothyroxine  75 mcg Oral Q0600   metoprolol succinate  50 mg Oral Daily   pantoprazole  40 mg Oral Daily   rosuvastatin  20 mg Oral Daily   sodium chloride flush  3 mL Intravenous Q12H   Continuous Infusions:  sodium chloride     PRN Meds: sodium chloride, acetaminophen **OR** acetaminophen, albuterol, cyclobenzaprine, metoCLOPramide (REGLAN) injection, nitroGLYCERIN, ondansetron (ZOFRAN) IV, ondansetron **OR** [DISCONTINUED] ondansetron (ZOFRAN) IV, oxyCODONE, polyethylene glycol, sodium chloride flush   Vital Signs    Vitals:   02/26/21 1530 02/26/21 1934 02/27/21 0034 02/27/21 0530  BP: 134/80 105/69 (!) 110/56 (!) 156/73  Pulse: 65 61 (!) 58 68  Resp:  18 18 18   Temp:  98.1 F (36.7 C) 98.4 F (36.9 C) 98.6 F (37 C)  TempSrc:  Oral Oral Oral  SpO2: 96% 97% 95% 94%  Weight:    119.7 kg  Height:        Intake/Output Summary (Last 24 hours) at 02/27/2021 0932 Last data filed at 02/27/2021 0857 Gross per 24 hour  Intake 1001.67 ml  Output 2150 ml  Net -1148.33 ml   Filed Weights   02/25/21 0730 02/26/21 0454 02/27/21 0530  Weight: 122.5 kg 120.6 kg 119.7 kg    Telemetry    Sinus rhythm- Personally Reviewed  ECG    None today- Personally Reviewed  Physical Exam    GEN: No acute distress.   Neck: No JVD Cardiac: RRR, no murmurs, rubs, or gallops.  Respiratory: Clear to auscultation bilaterally. GI: Soft, nontender, non-distended  MS: No edema; No deformity. Neuro:  Nonfocal  Psych: Normal affect   Labs    Chemistry Recent Labs  Lab 02/22/21 1501 02/24/21 1729 02/26/21 0108 02/26/21 1342 02/26/21 1351 02/27/21 0114  NA 139 140 138 141 140 139  K 4.8 3.8 3.6 3.8 3.8 3.7  CL 104 104 104  --   --  102  CO2 25 27 26   --   --  27  GLUCOSE 84 102* 91  --   --  82  BUN 10 11 13   --   --  16  CREATININE 1.17* 1.22* 1.29*  --   --  1.45*  CALCIUM 9.5 9.0 8.6*  --   --  8.5*  PROT 6.5  --  6.3*  --   --   --   ALBUMIN  --   --  3.0*  --   --   --   AST 18  --  20  --   --   --   ALT 8  --  12  --   --   --   ALKPHOS  --   --  66  --   --   --  BILITOT 0.6  --  0.7  --   --   --   GFRNONAA  --  49* 46*  --   --  40*  ANIONGAP  --  9 8  --   --  10     Hematology Recent Labs  Lab 02/22/21 1501 02/24/21 1729 02/26/21 0108 02/26/21 1342 02/26/21 1351  WBC 3.0* 3.6* 3.4*  --   --   RBC 3.79* 3.80* 3.62*  --   --   HGB 12.7 12.8 12.2 12.9 12.9  HCT 37.8 39.0 36.5 38.0 38.0  MCV 99.7 102.6* 100.8*  --   --   MCH 33.5* 33.7 33.7  --   --   MCHC 33.6 32.8 33.4  --   --   RDW 13.4 15.9* 15.5  --   --   PLT 156 168 160  --   --     Cardiac EnzymesNo results for input(s): TROPONINI in the last 168 hours. No results for input(s): TROPIPOC in the last 168 hours.   BNP Recent Labs  Lab 02/22/21 1501 02/25/21 0757  BNP 578* 310.9*     DDimer  Recent Labs  Lab 02/24/21 1729  DDIMER 1.27*     Radiology    CARDIAC CATHETERIZATION  Result Date: 02/26/2021 CONCLUSIONS: Total occlusion of the proximal native RCA which receives collaterals from the LAD and left circumflex territory. Patent left main Focal 90% stenosis in the proximal LAD.  LAD fills competitively because of patent LIMA to the mid vessel. Diffuse disease in the  proximal and mid circumflex with patent saphenous vein graft to the 2 large obtuse marginal branches. Widely patent LIMA to LAD. Widely patent sequential saphenous vein graft to the first and second obtuse marginal branches. Total occlusion at aorto-ostial junction, appearing chronic. RECOMMENDATIONS: Consider adding long-acting nitrates. Consider adding Jardiance or Farxiga for presumed diastolic heart failure.   ECHOCARDIOGRAM COMPLETE  Result Date: 02/25/2021    ECHOCARDIOGRAM REPORT   Patient Name:   [name] [facility] Date of Exam: 02/25/2021 Medical Rec #:  [id number]          Height:       67.0 in Accession #:    [id number]         Weight:       270.0 lb Date of Birth:  April 16, 1955         BSA:          2.297 m Patient Age:    65 years           BP:           114/71 mmHg Patient Gender: F                  HR:           64 bpm. Exam Location:  Inpatient Procedure: 2D Echo, Color Doppler and Cardiac Doppler Indications:    R07.9* Chest pain, unspecified  History:        Patient has prior history of Echocardiogram examinations, most                 recent 02/09/2017. Arrythmias:Atrial Fibrillation; Risk                 Factors:Hypertension, Dyslipidemia and Lupus. 02/09/17 CABG and                 Mitral Valve Repair with 33mm Sorin Mitral Memo 3D Ring.  Sonographer:    Raquel Sarna Senior RDCS Referring Phys:  [id number] [place]  1. Left ventricular ejection fraction, by estimation, is 50 to 55%. The left ventricle has low normal function. The left ventricle demonstrates regional wall motion abnormalities. Basal inferolateral akinesis. There is mild left ventricular hypertrophy.  Left ventricular diastolic parameters are indeterminate. Poor visualization of endocardium, consider limited echo with contrast to better evaluate wall motion abnormalities.  2. Right ventricular systolic function was not well visualized. The right ventricular size is not well visualized. There is normal pulmonary artery  systolic pressure. The estimated right ventricular systolic pressure is 82.4 mmHg.  3. There is a prosthetic annuloplasty ring present in the mitral position     Trivial mitral valve regurgitation. Mild to moderate mitral stenosis. MG 17mmHg at 64bpm, MVA 1.4 cm^2 by continuity equation  4. The aortic valve was not well visualized. Aortic valve regurgitation is trivial. No aortic stenosis is present.  5. Aortic dilatation noted. There is mild dilatation of the ascending aorta, measuring 39 mm.  6. The inferior vena cava is normal in size with greater than 50% respiratory variability, suggesting right atrial pressure of 3 mmHg. FINDINGS  Left Ventricle: Left ventricular ejection fraction, by estimation, is 50 to 55%. The left ventricle has low normal function. The left ventricle demonstrates regional wall motion abnormalities. The left ventricular internal cavity size was normal in size. There is mild left ventricular hypertrophy. Left ventricular diastolic parameters are indeterminate. Right Ventricle: The right ventricular size is not well visualized. Right vetricular wall thickness was not well visualized. Right ventricular systolic function was not well visualized. There is normal pulmonary artery systolic pressure. The tricuspid regurgitant velocity is 2.74 m/s, and with an assumed right atrial pressure of 3 mmHg, the estimated right ventricular systolic pressure is 23.5 mmHg. Left Atrium: Left atrial size was normal in size. Right Atrium: Right atrial size was normal in size. Pericardium: There is no evidence of pericardial effusion. Mitral Valve: The mitral valve has been repaired/replaced. Trivial mitral valve regurgitation. There is a prosthetic annuloplasty ring present in the mitral position. Mild to moderate mitral valve stenosis. MV peak gradient, 8.3 mmHg. The mean mitral valve gradient is 4.0 mmHg. Tricuspid Valve: The tricuspid valve is normal in structure. Tricuspid valve regurgitation is trivial.  Aortic Valve: The aortic valve was not well visualized. Aortic valve regurgitation is trivial. No aortic stenosis is present. Pulmonic Valve: The pulmonic valve was not well visualized. Pulmonic valve regurgitation is not visualized. Aorta: The aortic root is normal in size and structure and aortic dilatation noted. There is mild dilatation of the ascending aorta, measuring 39 mm. Venous: The inferior vena cava is normal in size with greater than 50% respiratory variability, suggesting right atrial pressure of 3 mmHg. IAS/Shunts: The interatrial septum was not well visualized.  LEFT VENTRICLE PLAX 2D LVIDd:         4.70 cm LVIDs:         4.10 cm LV PW:         0.60 cm LV IVS:        1.10 cm LVOT diam:     1.90 cm LV SV:         71 LV SV Index:   31 LVOT Area:     2.84 cm  RIGHT VENTRICLE RV S prime:     11.70 cm/s TAPSE (M-mode): 2.0 cm LEFT ATRIUM             Index        RIGHT ATRIUM  Index LA diam:        4.00 cm 1.74 cm/m   RA Area:     19.50 cm LA Vol (A2C):   73.1 ml 31.82 ml/m  RA Volume:   53.60 ml  23.33 ml/m LA Vol (A4C):   56.4 ml 24.55 ml/m LA Biplane Vol: 66.7 ml 29.03 ml/m  AORTIC VALVE LVOT Vmax:   125.00 cm/s LVOT Vmean:  93.700 cm/s LVOT VTI:    0.250 m  AORTA Ao Root diam: 3.20 cm Ao Asc diam:  3.90 cm MITRAL VALVE                TRICUSPID VALVE MV Area (PHT): 1.60 cm     TR Peak grad:   30.0 mmHg MV Area VTI:   1.44 cm     TR Vmax:        274.00 cm/s MV Peak grad:  8.3 mmHg MV Mean grad:  4.0 mmHg     SHUNTS MV Vmax:       1.44 m/s     Systemic VTI:  0.25 m MV Vmean:      98.8 cm/s    Systemic Diam: 1.90 cm MV Decel Time: 474 msec MR Peak grad: 97.6 mmHg MR Mean grad: 48.0 mmHg MR Vmax:      494.00 cm/s MR Vmean:     312.0 cm/s MV E velocity: 127.00 cm/s MV A velocity: 106.00 cm/s MV E/A ratio:  1.20 Oswaldo Milian MD Electronically signed by Oswaldo Milian MD Signature Date/Time: 02/25/2021/5:43:55 PM    Final     Cardiac Studies   LHC  02/27/2021 CONCLUSIONS: Total occlusion of the proximal native RCA which receives collaterals from the LAD and left circumflex territory. Patent left main Focal 90% stenosis in the proximal LAD.  LAD fills competitively because of patent LIMA to the mid vessel. Diffuse disease in the proximal and mid circumflex with patent saphenous vein graft to the 2 large obtuse marginal branches. Widely patent LIMA to LAD. Widely patent sequential saphenous vein graft to the first and second obtuse marginal branches. Total occlusion at aorto-ostial junction, appearing chronic.   RECOMMENDATIONS:   Consider adding long-acting nitrates. Consider adding Jardiance or Wilder Glade for presumed diastolic heart failure    Transthoracic echocardiogram February 25, 2021 IMPRESSIONS   1. Left ventricular ejection fraction, by estimation, is 50 to 55%. The  left ventricle has low normal function. The left ventricle demonstrates  regional wall motion abnormalities. Basal inferolateral akinesis. There is  mild left ventricular hypertrophy.   Left ventricular diastolic parameters are indeterminate. Poor  visualization of endocardium, consider limited echo with contrast to  better evaluate wall motion abnormalities.   2. Right ventricular systolic function was not well visualized. The right  ventricular size is not well visualized. There is normal pulmonary artery  systolic pressure. The estimated right ventricular systolic pressure is  87.8 mmHg.   3. There is a prosthetic annuloplasty ring present in the mitral position      Trivial mitral valve regurgitation. Mild to moderate mitral stenosis.  MG 73mmHg at 64bpm, MVA 1.4 cm^2 by continuity equation   4. The aortic valve was not well visualized. Aortic valve regurgitation  is trivial. No aortic stenosis is present.   5. Aortic dilatation noted. There is mild dilatation of the ascending  aorta, measuring 39 mm.   6. The inferior vena cava is normal in size with  greater than 50%  respiratory variability, suggesting right atrial pressure of 3 mmHg.  FINDINGS   Left Ventricle: Left ventricular ejection fraction, by estimation, is 50  to 55%. The left ventricle has low normal function. The left ventricle  demonstrates regional wall motion abnormalities. The left ventricular  internal cavity size was normal in  size. There is mild left ventricular hypertrophy. Left ventricular  diastolic parameters are indeterminate.   Right Ventricle: The right ventricular size is not well visualized. Right  vetricular wall thickness was not well visualized. Right ventricular  systolic function was not well visualized. There is normal pulmonary  artery systolic pressure. The tricuspid  regurgitant velocity is 2.74 m/s, and with an assumed right atrial  pressure of 3 mmHg, the estimated right ventricular systolic pressure is  51.7 mmHg.   Left Atrium: Left atrial size was normal in size.   Right Atrium: Right atrial size was normal in size.   Pericardium: There is no evidence of pericardial effusion.   Mitral Valve: The mitral valve has been repaired/replaced. Trivial mitral  valve regurgitation. There is a prosthetic annuloplasty ring present in  the mitral position. Mild to moderate mitral valve stenosis. MV peak  gradient, 8.3 mmHg. The mean mitral  valve gradient is 4.0 mmHg.   Tricuspid Valve: The tricuspid valve is normal in structure. Tricuspid  valve regurgitation is trivial.   Aortic Valve: The aortic valve was not well visualized. Aortic valve  regurgitation is trivial. No aortic stenosis is present.   Pulmonic Valve: The pulmonic valve was not well visualized. Pulmonic valve  regurgitation is not visualized.   Aorta: The aortic root is normal in size and structure and aortic  dilatation noted. There is mild dilatation of the ascending aorta,  measuring 39 mm.   Venous: The inferior vena cava is normal in size with greater than 50%   respiratory variability, suggesting right atrial pressure of 3 mmHg.   IAS/Shunts: The interatrial septum was not well visualized  Patient Profile     65 y.o. female with history of CAD s/p PCI and 4v CABG (2018), history of severe mitral regurgitation status post MV annuloplasty, postop AF, PAD, SLE, GERD, hypothyroidism, obesity and ITP.   Assessment & Plan    Coronary artery disease with chest pressure Shortness of breath History of severe mitral regurgitation status post mitral valve anoplasty Peripheral artery disease Obesity Chronic kidney disease  She is status post catheterization with no PCI, and for now plan is to move forward with antianginals in this patient.  I am going to start Imdur 30 mg daily.  Educated patient about the side effects. She will continue aspirin 81 mg daily, Crestor 20 mg daily. She also is experiencing some mostly stabbing pain in her back she may benefit from some lidocaine patch.  For her chronic diastolic heart failure I would recommend using transition the patient to p.o. Lasix 20 mg daily with potassium supplement.  Please add Farxiga 10 mg daily to her medication regimen as well. She can be discharged home today from a cardiovascular standpoint.  [facility] will sign off.   Medication Recommendations: Aspirin 81 mg daily, Crestor 20 mg daily, Imdur 30 mg daily, Farxiga 10 mg daily, metoprolol succinate 50 mg daily, Other recommendations (labs, testing, etc): None Follow up as an outpatient: We will need follow-up with cardiology in the outpatient setting.  For questions or updates, please contact [place] Please consult [email or web address] for contact info under Cardiology/STEMI.      Signed, Berniece Salines, DO  02/27/2021, 9:32 AM

## 2021-02-27 NOTE — Progress Notes (Signed)
Heart Failure Nurse Navigator Progress Note  PCP: Janith Lima, MD PCP-Cardiologist: Linard Millers., MD Admission Diagnosis: CP Admitted from: home alone  Presentation:   [name] presented 12/4 with CP. Pt underwent cardiac catheterization this admission. Pt resting in bed on room air, interactive with interview process. Pt drives, does not have issues obtaining medications. Pt had questions regarding nutrition-reviewed HF booklet paying close emphasis to daily weights, diet and fluid modifications, medication regimen. Explained HV TOC clinic benefits, pt agreeable.   ECHO/ LVEF: 50-55%, milf LVH  Clinical Course:  Past Medical History:  Diagnosis Date   CAD (coronary artery disease)    a. remote stents/angioplasty. b. s/p CABG 01/2017 with mitral valve annuloplasty.   Esophageal reflux    GERD (gastroesophageal reflux disease)    Hypothyroidism    Immune thrombocytopenic purpura (HCC)    Lupus erythematosus    Mitral valve insufficiency and aortic valve insufficiency    Myocardial infarction (HCC)    x 2   PONV (postoperative nausea and vomiting)    Postoperative atrial fibrillation (HCC)    Severe mitral regurgitation    a. s/p MV annuloplasty 01/2017 at time of CABG.   Status post mitral valve annuloplasty    Unspecified disease of pericardium      Social History   Socioeconomic History   Marital status: Single    Spouse name: Not on file   Number of children: 2   Years of education: Not on file   Highest education level: Master's degree (e.g., MA, MS, MEng, MEd, MSW, MBA)  Occupational History   Occupation: Regional Bereavement Care Coordinator    Comment: [employer]  Tobacco Use   Smoking status: Former    Packs/day: 0.50    Years: 30.00    Pack years: 15.00    Types: Cigarettes    Quit date: 12/23/2011    Years since quitting: 9.1   Smokeless tobacco: Never  Vaping Use   Vaping Use: Never used  Substance and Sexual Activity   Alcohol use: No    Drug use: No   Sexual activity: Not Currently    Comment: 1st intercourse 65 yo-More than 5 partners  Other Topics Concern   Not on file  Social History Narrative   HSG,  graduated from [employer] in [place] state. In college [name] -grad '13 with relgious studies. [employer] - Fall '13 for MA-divinity. Marrried '73 - 2 years/ divorced. 2 son- '73, '75 - CP.    Occupation: full-time Ship broker. She lives alone with her younger son living with her part-time but he resides in a managed care facility.    Social Determinants of Health   Financial Resource Strain: Low Risk    Difficulty of Paying Living Expenses: Not very hard  Food Insecurity: No Food Insecurity   Worried About Charity fundraiser in the Last Year: Never true   Ran Out of Food in the Last Year: Never true  Transportation Needs: No Transportation Needs   Lack of Transportation (Medical): No   Lack of Transportation (Non-Medical): No  Physical Activity: Not on file  Stress: No Stress Concern Present   Feeling of Stress : Not at all  Social Connections: Moderately Integrated   Frequency of Communication with Friends and Family: More than three times a week   Frequency of Social Gatherings with Friends and Family: More than three times a week   Attends Religious Services: More than 4 times per year   Active Member of Clubs or  Organizations: Yes   Attends Music therapist: More than 4 times per year   Marital Status: Divorced    High Risk Criteria for Readmission and/or Poor Patient Outcomes: Heart failure hospital admissions (last 6 months): 1  No Show rate: 5% Difficult social situation: no Demonstrates medication adherence: yes Primary Language: English Literacy level: able to read/write and comprehend.  Education Assessment and Provision:  Detailed education and instructions provided on heart failure disease management including the following:  Signs and symptoms of Heart Failure When to call  the physician Importance of daily weights Low sodium diet Fluid restriction Medication management Anticipated future follow-up appointments  Patient education given on each of the above topics.  Patient acknowledges understanding via teach back method and acceptance of all instructions.  Education Materials:  "Living Better With Heart Failure" Booklet, HF zone tool, & Daily Weight Tracker Tool.  Patient has scale at home: yes Patient has pill box at home: yes   Barriers of Care:   -diet/fluid indiscretions  Considerations/Referrals:   Referral made to Heart Failure Pharmacist Stewardship: no Referral made to Heart Failure CSW/NCM TOC: no Referral made to Heart & Vascular TOC clinic: yes, 12/15 @ noon per pt request.  Items for Follow-up on DC/TOC: -optimize -diet/fluid modification education   Pricilla Holm, MSN, RN Heart Failure Nurse Navigator [phone]

## 2021-03-06 ENCOUNTER — Encounter (HOSPITAL_COMMUNITY): Payer: BC Managed Care – PPO

## 2021-03-06 ENCOUNTER — Telehealth (HOSPITAL_COMMUNITY): Payer: Self-pay

## 2021-03-06 NOTE — Progress Notes (Signed)
HEART & VASCULAR TRANSITION OF CARE CONSULT NOTE     Referring Physician: Dr. Harriet Masson  Primary Care: Janith Lima, MD  Primary Cardiologist: Dr. Tamala Julian   HPI: Referred to clinic by Dr. Harriet Masson, Cardiology, for heart failure consultation.   65 y.o. female, followed by Dr. Tamala Julian, with a hx of lupus erythematosus, coronary artery disease with prior history of stenting and ultimate four-vessel coronary bypass grafting 2018, mitral valve annuloplasty at the time of CABG in 2018, postoperative atrial fibrillation, PAD, and ascending aortic aneurysm.  Recently admitted 02/24/21 for chest pain and SOB. Found to be in acute CHF. BNP 578. CXR no active cardiopulmonary disease.  Chest CT negative for PE. CP was atypical and ischemic w/u negative. Hs trop 10>>11>>9.   Echo EF 50-55%, mild LVH, RV not well visualized. Trivial mitral valve regurgitation. Mild to moderate mitral stenosis, MG 80mmHg.   She was diuresed w/ IV Lasix. LHC after diuresis showed widely patent LIMA to LAD, widely patent sequential saphenous vein graft to the first and second obtuse marginal branches, total occlusion at aorto-ostial junction, appearing chronic. RHC showed normal right heart pressures and capillary wedge pressure and normal LVEDP.   She was transitioned to PO Lasix + Farxiga. Imdur 30 mg also added to regimen. D/c wt 263 lb. Referred to TOC.   Presents today for f/u. Doing well. Wt has remained stable post discharge. Dyspnea and CP resolved. Exercise tolerance improved. Feels better. No further orthopnea/PND.  No LEE. Tolerating meds ok w/ the exception of slight HA from Imdur, though usually resolves w/ Tylenol. BP and HR well controlled. She is interested in restarting cardiac rehab. Also interested in referral to dietitian.    Cardiac Testing   2D Echo 12/22 left ventricular ejection fraction, by estimation, is 50 to 55%. The left ventricle has low normal function. The left ventricle demonstrates regional  wall motion abnormalities. Basal inferolateral akinesis. There is mild left ventricular hypertrophy. Left ventricular diastolic parameters are indeterminate. Poor visualization of endocardium, consider limited echo with contrast to better evaluate wall motion abnormalities. 1. Right ventricular systolic function was not well visualized. The right ventricular size is not well visualized. There is normal pulmonary artery systolic pressure. The estimated right ventricular systolic pressure is 66.4 mmHg. 2. There is a prosthetic annuloplasty ring present in the mitral position Trivial mitral valve regurgitation. Mild to moderate mitral stenosis. MG 5mmHg at 64bpm, MVA 1.4 cm^2 by continuity equation 3. The aortic valve was not well visualized. Aortic valve regurgitation is trivial. No aortic stenosis is present. 4. Aortic dilatation noted. There is mild dilatation of the ascending aorta, measuring 39 mm. 5. The inferior vena cava is normal in size with greater than 50% respiratory variability, suggesting right atrial pressure of 3 mmHg.  R/LHC 12/22  Patent LIMA to LAD Patent sequential SVG to OM1 and OM 2 Occluded sequential SVG to PDA and PL (appears chronic). Patent left main Proximal LAD 90% stenosis with competitive distal flow due to patent LIMA to mid vessel Occluded first and second obtuse marginal of circumflex.  Circumflex is otherwise diffusely diseased with proximal 75% stenosis.  A small distal third OM is potentially threatened. Proximal RCA occlusion. Normal LVEDP.  LVEF estimated at 55%. Normal right heart pressures and capillary wedge pressure.   Review of Systems: [y] = yes, [ ]  = no   General: Weight gain [ ] ; Weight loss [ ] ; Anorexia [ ] ; Fatigue [ ] ; Fever [ ] ; Chills [ ] ; Weakness [ ]   Cardiac: Chest pain/pressure [ ] ; Resting SOB [ ] ; Exertional SOB [ ] ; Orthopnea [ ] ; Pedal Edema [ ] ; Palpitations [ ] ; Syncope [ ] ; Presyncope [ ] ; Paroxysmal nocturnal  dyspnea[ ]   Pulmonary: Cough [ ] ; Wheezing[ ] ; Hemoptysis[ ] ; Sputum [ ] ; Snoring [ ]   GI: Vomiting[ ] ; Dysphagia[ ] ; Melena[ ] ; Hematochezia [ ] ; Heartburn[ ] ; Abdominal pain [ ] ; Constipation [ ] ; Diarrhea [ ] ; BRBPR [ ]   GU: Hematuria[ ] ; Dysuria [ ] ; Nocturia[ ]   Vascular: Pain in legs with walking [ ] ; Pain in feet with lying flat [ ] ; Non-healing sores [ ] ; Stroke [ ] ; TIA [ ] ; Slurred speech [ ] ;  Neuro: Headaches[ Y]; Vertigo[ ] ; Seizures[ ] ; Paresthesias[ ] ;Blurred vision [ ] ; Diplopia [ ] ; Vision changes [ ]   Ortho/Skin: Arthritis [ ] ; Joint pain [ ] ; Muscle pain [ ] ; Joint swelling [ ] ; Back Pain [ ] ; Rash [ ]   Psych: Depression[ ] ; Anxiety[ ]   Heme: Bleeding problems [ ] ; Clotting disorders [ ] ; Anemia [ ]   Endocrine: Diabetes [ ] ; Thyroid dysfunction[ ]    Past Medical History:  Diagnosis Date   CAD (coronary artery disease)    a. remote stents/angioplasty. b. s/p CABG 01/2017 with mitral valve annuloplasty.   Esophageal reflux    GERD (gastroesophageal reflux disease)    Hypothyroidism    Immune thrombocytopenic purpura (HCC)    Lupus erythematosus    Mitral valve insufficiency and aortic valve insufficiency    Myocardial infarction (HCC)    x 2   PONV (postoperative nausea and vomiting)    Postoperative atrial fibrillation (HCC)    Severe mitral regurgitation    a. s/p MV annuloplasty 01/2017 at time of CABG.   Status post mitral valve annuloplasty    Unspecified disease of pericardium     Current Outpatient Medications  Medication Sig Dispense Refill   albuterol (VENTOLIN HFA) 108 (90 Base) MCG/ACT inhaler Inhale 2 puffs into the lungs every 6 (six) hours as needed for wheezing or shortness of breath. 8 g 0   aspirin 81 MG chewable tablet Chew 1 tablet (81 mg total) by mouth daily.     azaTHIOprine (IMURAN) 50 MG tablet TAKE 1 TABLET BY MOUTH TWICE A DAY (Patient taking differently: Take 50 mg by mouth in the morning and at bedtime.) 180 tablet 0   cyclobenzaprine  (FLEXERIL) 5 MG tablet Take 1 tablet (5 mg total) by mouth 3 (three) times daily as needed. 30 tablet 0   dapagliflozin propanediol (FARXIGA) 10 MG TABS tablet Take 1 tablet (10 mg total) by mouth daily. 30 tablet 0   furosemide (LASIX) 20 MG tablet Take 1 tablet (20 mg total) by mouth daily. 30 tablet 0   hydroxychloroquine (PLAQUENIL) 200 MG tablet Take 200 mg by mouth daily.      isosorbide mononitrate (IMDUR) 30 MG 24 hr tablet Take 1 tablet (30 mg total) by mouth daily. 30 tablet 0   levothyroxine (SYNTHROID) 75 MCG tablet TAKE 1 TABLET BY MOUTH EVERY DAY (Patient taking differently: Take 75 mcg by mouth daily before breakfast.) 90 tablet 0   metoprolol succinate (TOPROL-XL) 50 MG 24 hr tablet TAKE 1 TABLET (50 MG TOTAL) BY MOUTH DAILY. KEEP FOLLOW UP FOR FUTURE REFILLS. (Patient taking differently: Take 50 mg by mouth daily.) 90 tablet 3   nitroGLYCERIN (NITROSTAT) 0.4 MG SL tablet Place 1 tablet (0.4 mg total) under the tongue every 5 (five) minutes as needed for chest pain. 30 tablet 0  oxyCODONE (OXY IR/ROXICODONE) 5 MG immediate release tablet Take 1 tablet (5 mg total) by mouth every 4 (four) hours as needed for moderate pain. 5 tablet 0   pantoprazole (PROTONIX) 40 MG tablet TAKE 1 TABLET BY MOUTH EVERY DAY (Patient taking differently: 40 mg daily.) 90 tablet 3   potassium chloride SA (KLOR-CON M) 20 MEQ tablet Take 1 tablet (20 mEq total) by mouth daily. 30 tablet 0   rosuvastatin (CRESTOR) 20 MG tablet Take 1 tablet (20 mg total) by mouth daily. Please keep upcoming appt for future refills. (Patient taking differently: Take 20 mg by mouth daily.) 90 tablet 0   No current facility-administered medications for this encounter.    Allergies  Allergen Reactions   Amlodipine Swelling    Leg edema   Cellcept [Mycophenolate] Nausea Only      Social History   Socioeconomic History   Marital status: Single    Spouse name: Not on file   Number of children: 2   Years of education:  Not on file   Highest education level: Master's degree (e.g., MA, MS, MEng, MEd, MSW, MBA)  Occupational History   Occupation: Regional Bereavement Care Coordinator    Comment: [employer]  Tobacco Use   Smoking status: Former    Packs/day: 0.50    Years: 30.00    Pack years: 15.00    Types: Cigarettes    Quit date: 12/23/2011    Years since quitting: 9.2   Smokeless tobacco: Never  Vaping Use   Vaping Use: Never used  Substance and Sexual Activity   Alcohol use: No   Drug use: No   Sexual activity: Not Currently    Comment: 1st intercourse 65 yo-More than 5 partners  Other Topics Concern   Not on file  Social History Narrative   HSG,  graduated from [employer] in [place] state. In college [name] -grad '13 with relgious studies. [employer] - Fall '13 for MA-divinity. Marrried '73 - 2 years/ divorced. 2 son- '73, '75 - CP.    Occupation: full-time Ship broker. She lives alone with her younger son living with her part-time but he resides in a managed care facility.    Social Determinants of Health   Financial Resource Strain: Low Risk    Difficulty of Paying Living Expenses: Not very hard  Food Insecurity: No Food Insecurity   Worried About Charity fundraiser in the Last Year: Never true   Ran Out of Food in the Last Year: Never true  Transportation Needs: No Transportation Needs   Lack of Transportation (Medical): No   Lack of Transportation (Non-Medical): No  Physical Activity: Not on file  Stress: No Stress Concern Present   Feeling of Stress : Not at all  Social Connections: Moderately Integrated   Frequency of Communication with Friends and Family: More than three times a week   Frequency of Social Gatherings with Friends and Family: More than three times a week   Attends Religious Services: More than 4 times per year   Active Member of [employer] or Organizations: Yes   Attends Music therapist: More than 4 times per year   Marital Status: Divorced  Arboriculturist Violence: Not on file      Family History  Problem Relation Age of Onset   Cancer Mother        breast and cervical   Paget's disease of bone Mother    Fibromyalgia Mother    Breast cancer Mother    Hypertension  Father    Heart disease Father        PVD   GER disease Father    Cancer Paternal Grandfather        colon   Heart attack Maternal Grandmother     Vitals:   03/07/21 1219  BP: 122/84  Pulse: 67  SpO2: 98%  Weight: 121.2 kg (267 lb 3.2 oz)    PHYSICAL EXAM: General:  Well appearing. No respiratory difficulty HEENT: normal Neck: supple. no JVD. Carotids 2+ bilat; no bruits. No lymphadenopathy or thryomegaly appreciated. Cor: PMI nondisplaced. Regular rate & rhythm. No rubs, gallops or murmurs. Lungs: clear Abdomen: soft, nontender, nondistended. No hepatosplenomegaly. No bruits or masses. Good bowel sounds. Extremities: no cyanosis, clubbing, rash, edema Neuro: alert & oriented x 3, cranial nerves grossly intact. moves all 4 extremities w/o difficulty. Affect pleasant.  ECG: Not performed   ASSESSMENT & PLAN:  1. Chronic Diastolic Heart Failure - Echo 12/22 EF 50-55%, RV not well visualized - NYHA Class II - Volume status stable, euvolemic on exam. HR and BP well controlled - Continue Lasix 20 mg daily  - Continue Farxiga 10 mg daily  - Continue ? blocker, Toprol XL 50 mg daily  - Discussed continuation of daily wts, low sodium diet, 2L Fluid restriction/day  - Check BMP today   2. CAD - prior history of stenting and ultimate four-vessel coronary bypass grafting 2018 - LHC 12/22 widely patent LIMA to LAD, widely patent sequential saphenous vein graft to the first and second obtuse marginal branches, total occlusion at aorto-ostial junction, appearing chronic - Medical therapy w/ ASA, ? blocker, Statin + LA nitrate - stable w/o CP  - followed by Dr. Tamala Julian   3. H/o Severe Mitral Regurgitation  - s/p MV repair/ annuloplasty at time of CABG in  2018  - stable on recent echo 12/22 w/ stable prosthetic annuloplasty ring. Trivial MR. Mild-Mod MS, mean gradient 40mmHg.   4. Stage III CKD - followed by Dr. Justin Mend - on Farxiga  - Check BMP today    NYHA II GDMT  Diuretic- Lasix 20 mg daily  BB-Toprol XL 50 mg daily  Ace/ARB/ARNI - No MRA No  SGLT2i -Farxiga 10 mg daily     Referred to HFSW (PCP, Medications, Transportation, ETOH Abuse, Drug Abuse, Insurance, Financial ): No Refer to Pharmacy:  No Refer to Home Health:  No Refer to Advanced Heart Failure Clinic: No  Refer to General Cardiology: Yes (followed by Dr. Tamala Julian)   Refer to dietitian   Follow up  w/ Dr. Tamala Julian

## 2021-03-06 NOTE — Telephone Encounter (Signed)
Call attempted to confirm [facility] TOC appt tomorrow (12/15) at noon. HIPPA appropriate VM left with callback number.   Pricilla Holm, MSN, RN Heart Failure Nurse Navigator [phone]

## 2021-03-07 ENCOUNTER — Encounter (HOSPITAL_COMMUNITY): Payer: Self-pay

## 2021-03-07 ENCOUNTER — Other Ambulatory Visit: Payer: Self-pay

## 2021-03-07 ENCOUNTER — Ambulatory Visit (HOSPITAL_COMMUNITY)
Admit: 2021-03-07 | Discharge: 2021-03-07 | Disposition: A | Discharge status: Home / Self Care | Payer: BC Managed Care – PPO | Attending: Cardiology | Admitting: Cardiology

## 2021-03-07 VITALS — BP 122/84 | HR 67 | Wt 267.2 lb

## 2021-03-07 DIAGNOSIS — I7121 Aneurysm of the ascending aorta, without rupture: Secondary | ICD-10-CM | POA: Insufficient documentation

## 2021-03-07 DIAGNOSIS — I739 Peripheral vascular disease, unspecified: Secondary | ICD-10-CM | POA: Insufficient documentation

## 2021-03-07 DIAGNOSIS — R635 Abnormal weight gain: Secondary | ICD-10-CM

## 2021-03-07 DIAGNOSIS — I4891 Unspecified atrial fibrillation: Secondary | ICD-10-CM | POA: Diagnosis not present

## 2021-03-07 DIAGNOSIS — I050 Rheumatic mitral stenosis: Secondary | ICD-10-CM | POA: Insufficient documentation

## 2021-03-07 DIAGNOSIS — Z7984 Long term (current) use of oral hypoglycemic drugs: Secondary | ICD-10-CM | POA: Insufficient documentation

## 2021-03-07 DIAGNOSIS — Z955 Presence of coronary angioplasty implant and graft: Secondary | ICD-10-CM | POA: Insufficient documentation

## 2021-03-07 DIAGNOSIS — Z79899 Other long term (current) drug therapy: Secondary | ICD-10-CM | POA: Diagnosis not present

## 2021-03-07 DIAGNOSIS — I2510 Atherosclerotic heart disease of native coronary artery without angina pectoris: Secondary | ICD-10-CM | POA: Insufficient documentation

## 2021-03-07 DIAGNOSIS — R29898 Other symptoms and signs involving the musculoskeletal system: Secondary | ICD-10-CM | POA: Diagnosis not present

## 2021-03-07 DIAGNOSIS — L930 Discoid lupus erythematosus: Secondary | ICD-10-CM | POA: Insufficient documentation

## 2021-03-07 DIAGNOSIS — Z7982 Long term (current) use of aspirin: Secondary | ICD-10-CM | POA: Diagnosis not present

## 2021-03-07 DIAGNOSIS — I5032 Chronic diastolic (congestive) heart failure: Secondary | ICD-10-CM | POA: Insufficient documentation

## 2021-03-07 DIAGNOSIS — N183 Chronic kidney disease, stage 3 unspecified: Secondary | ICD-10-CM | POA: Diagnosis not present

## 2021-03-07 DIAGNOSIS — Z951 Presence of aortocoronary bypass graft: Secondary | ICD-10-CM | POA: Insufficient documentation

## 2021-03-07 DIAGNOSIS — E1122 Type 2 diabetes mellitus with diabetic chronic kidney disease: Secondary | ICD-10-CM | POA: Insufficient documentation

## 2021-03-07 DIAGNOSIS — Z79631 Long term (current) use of antimetabolite agent: Secondary | ICD-10-CM | POA: Insufficient documentation

## 2021-03-07 LAB — BASIC METABOLIC PANEL
Anion gap: 6 (ref 5–15)
BUN: 16 mg/dL (ref 8–23)
CO2: 25 mmol/L (ref 22–32)
Calcium: 9.1 mg/dL (ref 8.9–10.3)
Chloride: 107 mmol/L (ref 98–111)
Creatinine, Ser: 1.31 mg/dL — ABNORMAL HIGH (ref 0.44–1.00)
GFR, Estimated: 45 mL/min — ABNORMAL LOW (ref >60)
Glucose, Bld: 86 mg/dL (ref 70–99)
Potassium: 4.3 mmol/L (ref 3.5–5.1)
Sodium: 138 mmol/L (ref 135–145)

## 2021-03-07 NOTE — Addendum Note (Signed)
Encounter addended by: Consuelo Pandy, PA-C on: 03/07/2021 3:16 PM  Actions taken: Allergies reviewed, Medication List reviewed, Problem List reviewed, Visit diagnoses modified, Diagnosis association updated

## 2021-03-07 NOTE — Patient Instructions (Signed)
Labs done today. We will contact you only if your labs are abnormal.  No medication changes were made. Please continue all current medications as prescribed.  You have been referred to a Dietitian. They will contact you to schedule an appointment.   Thank you for allowing [name] to provide your heart failure care after your recent hospitalization. Please follow-up with Dr. Tamala Julian at [facility]. In about 4 weeks.

## 2021-03-18 ENCOUNTER — Other Ambulatory Visit: Payer: Self-pay | Admitting: Interventional Cardiology

## 2021-03-25 DIAGNOSIS — Z0289 Encounter for other administrative examinations: Secondary | ICD-10-CM

## 2021-03-27 ENCOUNTER — Telehealth: Payer: Self-pay | Admitting: Interventional Cardiology

## 2021-03-27 MED ORDER — DAPAGLIFLOZIN PROPANEDIOL 10 MG PO TABS: 10.0000 mg | ORAL_TABLET | Freq: Every day | ORAL | 2 refills | Status: DC

## 2021-03-27 MED ORDER — ISOSORBIDE MONONITRATE ER 30 MG PO TB24: 30.0000 mg | ORAL_TABLET | Freq: Every day | ORAL | 2 refills | Status: DC

## 2021-03-27 MED ORDER — FUROSEMIDE 20 MG PO TABS: 20.0000 mg | ORAL_TABLET | Freq: Every day | ORAL | 2 refills | Status: DC

## 2021-03-27 NOTE — Telephone Encounter (Signed)
°*  STAT* If patient is at the pharmacy, call can be transferred to refill team.   1. Which medications need to be refilled? (please list name of each medication and dose if known)  isosorbide mononitrate (IMDUR) 30 MG 24 hr tablet dapagliflozin propanediol (FARXIGA) 10 MG TABS tablet furosemide (LASIX) 20 MG tablet   2. Which pharmacy/location (including street and city if local pharmacy) is medication to be sent to? [id number] [place], [place] - [address]  3. Do they need a 30 day or 90 day supply? 90 day

## 2021-03-27 NOTE — Telephone Encounter (Signed)
Pt's medication was sent to pt's pharmacy as requested. Confirmation received.  °

## 2021-04-02 NOTE — Therapy (Signed)
OUTPATIENT PHYSICAL THERAPY THORACOLUMBAR EVALUATION   Patient Name: [name] MRN: [id number] DOB:1955/05/13, 66 y.o., female Today's Date: 04/04/2021   PT End of Session - 04/04/21 1754     Visit Number 1    Number of Visits 13    Date for PT Re-Evaluation 05/18/21    Authorization Type BCBS    PT Start Time 1755   patient late   PT Stop Time 1828    PT Time Calculation (min) 33 min    Activity Tolerance Patient tolerated treatment well    Behavior During Therapy WFL for tasks assessed/performed             Past Medical History:  Diagnosis Date   CAD (coronary artery disease)    a. remote stents/angioplasty. b. s/p CABG 01/2017 with mitral valve annuloplasty.   Esophageal reflux    GERD (gastroesophageal reflux disease)    Hypothyroidism    Immune thrombocytopenic purpura (HCC)    Lupus erythematosus    Mitral valve insufficiency and aortic valve insufficiency    Myocardial infarction (HCC)    x 2   PONV (postoperative nausea and vomiting)    Postoperative atrial fibrillation (HCC)    Severe mitral regurgitation    a. s/p MV annuloplasty 01/2017 at time of CABG.   Status post mitral valve annuloplasty    Unspecified disease of pericardium    Past Surgical History:  Procedure Laterality Date   CHOLECYSTECTOMY, LAPAROSCOPIC  2010   CORONARY ARTERY BYPASS GRAFT N/A 01/30/2017   Procedure: CORONARY ARTERY BYPASS GRAFTING (CABG), Times four  using the right saphaneous vein, harvested endoscopicly.  and left internal mammary artery .;  Surgeon: Gaye Pollack, MD;  Location: [facility];  Service: Open Heart Surgery;  Laterality: N/A;   LEFT HEART CATH AND CORONARY ANGIOGRAPHY N/A 01/23/2017   Procedure: LEFT HEART CATH AND CORONARY ANGIOGRAPHY;  Surgeon: Belva Crome, MD;  Location: [facility];  Service: Cardiovascular;  Laterality: N/A;   MITRAL VALVE REPAIR N/A 01/30/2017   Procedure: MITRAL VALVE REPAIR (MVR);  Surgeon: Gaye Pollack, MD;  Location:  [facility] [place];  Service: Open Heart Surgery;  Laterality: N/A;   plastic surgical repair      dog bite to leg   RIGHT/LEFT HEART CATH AND CORONARY/GRAFT ANGIOGRAPHY N/A 02/26/2021   Procedure: RIGHT/LEFT HEART CATH AND CORONARY/GRAFT ANGIOGRAPHY;  Surgeon: Belva Crome, MD;  Location: [facility];  Service: Cardiovascular;  Laterality: N/A;   SPLENECTOMY  5-04   TEE WITHOUT CARDIOVERSION N/A 01/27/2017   Procedure: TRANSESOPHAGEAL ECHOCARDIOGRAM (TEE);  Surgeon: Sanda Klein, MD;  Location: [facility];  Service: Cardiovascular;  Laterality: N/A;   TEE WITHOUT CARDIOVERSION N/A 01/30/2017   Procedure: TRANSESOPHAGEAL ECHOCARDIOGRAM (TEE);  Surgeon: Gaye Pollack, MD;  Location: [place];  Service: Open Heart Surgery;  Laterality: N/A;   ULTRASOUND GUIDANCE FOR VASCULAR ACCESS  01/23/2017   Procedure: Ultrasound Guidance For Vascular Access;  Surgeon: Belva Crome, MD;  Location: [place] [facility];  Service: Cardiovascular;;   Patient Active Problem List   Diagnosis Date Noted   Chronic heart failure with preserved ejection fraction ([place]) 04/03/2021   Hyperlipidemia LDL goal <70 04/03/2021   Screen for colon cancer 04/03/2021   Systemic lupus erythematosus ([place]) 02/25/2021   Stage 3a chronic kidney disease ([place])    Hypertension 08/08/2020   Chronic idiopathic constipation 05/31/2020   Morbid obesity ([place]) 05/31/2020   Cervical cancer screening 05/27/2017   Visit for screening mammogram 05/27/2017  Routine general medical examination at a health care facility 02/18/2017   Postoperative atrial fibrillation ([place]) 02/18/2017   S/P CABG x 4 01/30/2017   S/P MVR (mitral valve repair) 01/30/2017   Osteoarthritis of spine with radiculopathy, lumbar region 06/24/2016   Hypothyroidism 04/08/2011   Coronary artery disease involving coronary bypass graft of native heart with angina pectoris ([place]) 02/26/2009   GERD without esophagitis 08/05/2007   Systemic lupus erythematosus with focal and  segmental proliferative glomerulonephritis ([place]) 08/05/2007    PCP: Janith Lima, MD  REFERRING PROVIDER: Florencia Reasons, MD  REFERRING DIAG: M54.9 (ICD-10-CM) - Upper back pain on right side  THERAPY DIAG:  Pain in thoracic spine  Chronic bilateral low back pain without sciatica  Cramp and spasm  Muscle weakness (generalized)  ONSET DATE: chronic; >2 years   SUBJECTIVE:                                                                                                                                                                                           SUBJECTIVE STATEMENT: Patient reports history of chronic back pain for at least 2 years of insidious onset. She reports the pain has continued to worsen over the past two years and over the past 7-8 months has began to be more centralized to the middle of her back. She has received injections in her low back that has provided short term pain relief, but she now feels that her pain is higher up. She denies any numbness or tingling. She denies any changes in bowel or bladder.  PERTINENT HISTORY:  Recent hospitalization 12/5-12/7/22 for acute CHF Systemic Lupus Erythematosus CAD s/p MI 2003 S/p CABG 2018 Atrial fibrillation See PMH above  PAIN:  Are you having pain? Yes VAS scale: 8/10 Pain location: mid back  Pain orientation: Posterior  PAIN TYPE: sharp and tight Pain description: constant  Aggravating factors: prolonged standing Relieving factors: sleep   PRECAUTIONS: None  WEIGHT BEARING RESTRICTIONS No  FALLS:  Has patient fallen in last 6 months? 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 And strengthen my heart."    OBJECTIVE:   DIAGNOSTIC FINDINGS:  Reports 3  spinal MRI in the past year with patient reporting findings of arthritis. Imaging is not on file.   PATIENT SURVEYS:  FOTO 40% function to 51% predicted  SCREENING FOR RED FLAGS: Bowel or bladder incontinence: No Cauda equina syndrome: No  COGNITION:  Overall cognitive status: Within functional limits for tasks assessed     SENSATION:  Light touch: Appears intact     POSTURE:  Forward head, rounded shoulders   PALPATION: TTP Rt thoracic paraspinals with spasm occurring with palpation.   LUMBARAROM/PROM  A/PROM A/PROM  04/04/2021  Flexion WNL minor LBP  Extension WNL; pulling along back  Right lateral flexion WNL; pulling along back  Left lateral flexion WNL  Right rotation WNL  Left rotation WNL; pain along Rt upper thoracic    (Blank rows = not tested)  LE MMT:  MMT Right 04/04/2021 Left 04/04/2021  Hip flexion 4+ 5  Hip extension    Hip abduction 3+ 4-  Hip adduction    Hip internal rotation    Hip external rotation    Knee flexion    Knee extension    Ankle dorsiflexion    Ankle plantarflexion    Ankle inversion    Ankle eversion     (Blank rows = not tested)   UPPER EXTREMITY AROM/PROM: Shoulder AROM WNL bilaterally with patient reporting discomfort in the midback with functional ER on the RUE.   (Blank rows = not tested)  UPPER EXTREMITY MMT: Rt mid thoracic pain with all MMT on the RUE  MMT Right 04/04/2021 Left 04/04/2021  Shoulder flexion 4 5  Shoulder extension    Shoulder abduction 4 5  Shoulder adduction    Middle trapezius 4- 4-  Lower trapezius    Shoulder ER 4 5  Shoulder IR 4 5  Wrist flexion    Wrist extension    Wrist ulnar deviation    Wrist radial deviation    Wrist pronation    Wrist supination    Grip strength (lbs)    (Blank rows = not tested)   JOINT MOBILITY TESTING:  Pain before restriction thoracic spine PAIVM   LUMBAR SPECIAL TESTS:  (-) SLR     TODAY'S TREATMENT  [facility] Adult PT Treatment:                                                 DATE: 04/04/21   Therapeutic Activity: Education on assessment findings that will be addressed throughout duration of POC    PATIENT EDUCATION:  Education details: See treatment above  Person educated: Patient Education method: Explanation Education comprehension: verbalized understanding   HOME EXERCISE PROGRAM: No time at eval to issue.   ASSESSMENT:  CLINICAL IMPRESSION: Patient is a 66 y.o. female who was seen today for physical therapy evaluation and treatment for chronic back pain that has gradually worsened over the past 2 years of insidious onset. Her pain today is localized to the Rt mid/upper thoracic region, though she reports chronic low back pain as well. She has good ROM about the lumbar spine and shoulders though reports back pain with lumbar flexion,extension, rotation, and lateral flexion ROM and functional ER reaching on the RUE. She demonstrates weakness about the RUE with thoracic pain elicited with MMT of the Rt shoulder. She also demonstrates bilateral hip weakness, though no onset of pain with hip MMT. She has palpable tenderness about Rt thoracic paraspinals with spasm induced during palpation. Patient will benefit from skilled PT to address above impairments and improve overall function.  REHAB POTENTIAL: Fair    CLINICAL DECISION MAKING: Stable/uncomplicated  EVALUATION COMPLEXITY: Low   GOALS: Goals  reviewed with patient? No  SHORT TERM GOALS:  STG Name Target Date Goal status  1 Patient will be independent and compliant with initial HEP.   Baseline: no time at eval 04/18/2021 INITIAL  2 Patient will report pain <5/10 indicative of improvements in overall condition.  Baseline:  04/25/2021 INITIAL   LONG TERM GOALS:   LTG Name Target Date Goal status  1 Patient will demonstrate 4+/5 Rt shoulder strength without reports of back pain to improve ability to complete lifting and carrying activity.  Baseline: 05/16/2021  INITIAL  2 Patient will demonstrate 4+/5 bilateral middle trap strength to improve scapular stability.  Baseline: 05/16/2021 INITIAL  3 Patient will score at least 51% on FOTO to signify clinically meaningful improvement in functional abilities.   Baseline: 40 05/16/2021 INITIAL  4 Patient will demonstrate at least 4+/5 bilateral hip abductor strength to improve stability about the chain with prolonged walking.  Baseline: 05/16/2021 INITIAL   PLAN: PT FREQUENCY: 2x/week  PT DURATION: 6 weeks  PLANNED INTERVENTIONS: Therapeutic exercises, Therapeutic activity, Neuro Muscular re-education, Balance training, Patient/Family education, Joint mobilization, Dry Needling, Cryotherapy, Moist heat, and Manual therapy  PLAN FOR NEXT SESSION: issue HEP to include spinal mobility, hip and periscapular strengthening. Review FOTO  Gwendolyn Grant, PT, DPT, ATC 04/04/21 6:33 PM

## 2021-04-03 ENCOUNTER — Other Ambulatory Visit: Payer: Self-pay

## 2021-04-03 ENCOUNTER — Encounter: Payer: Self-pay | Admitting: Internal Medicine

## 2021-04-03 ENCOUNTER — Ambulatory Visit (INDEPENDENT_AMBULATORY_CARE_PROVIDER_SITE_OTHER): Payer: BC Managed Care – PPO | Admitting: Internal Medicine

## 2021-04-03 VITALS — BP 136/86 | HR 68 | Temp 97.8°F | Resp 16 | Ht 67.0 in | Wt 261.0 lb

## 2021-04-03 DIAGNOSIS — Z23 Encounter for immunization: Secondary | ICD-10-CM

## 2021-04-03 DIAGNOSIS — E785 Hyperlipidemia, unspecified: Secondary | ICD-10-CM | POA: Insufficient documentation

## 2021-04-03 DIAGNOSIS — E039 Hypothyroidism, unspecified: Secondary | ICD-10-CM

## 2021-04-03 DIAGNOSIS — N1831 Chronic kidney disease, stage 3a: Secondary | ICD-10-CM

## 2021-04-03 DIAGNOSIS — I25709 Atherosclerosis of coronary artery bypass graft(s), unspecified, with unspecified angina pectoris: Secondary | ICD-10-CM | POA: Diagnosis not present

## 2021-04-03 DIAGNOSIS — Z124 Encounter for screening for malignant neoplasm of cervix: Secondary | ICD-10-CM

## 2021-04-03 DIAGNOSIS — I5032 Chronic diastolic (congestive) heart failure: Secondary | ICD-10-CM | POA: Diagnosis not present

## 2021-04-03 DIAGNOSIS — Z1211 Encounter for screening for malignant neoplasm of colon: Secondary | ICD-10-CM

## 2021-04-03 DIAGNOSIS — I10 Essential (primary) hypertension: Secondary | ICD-10-CM | POA: Diagnosis not present

## 2021-04-03 DIAGNOSIS — Z0001 Encounter for general adult medical examination with abnormal findings: Secondary | ICD-10-CM

## 2021-04-03 DIAGNOSIS — Z0000 Encounter for general adult medical examination without abnormal findings: Secondary | ICD-10-CM | POA: Diagnosis not present

## 2021-04-03 LAB — BASIC METABOLIC PANEL
BUN: 26 mg/dL — ABNORMAL HIGH (ref 6–23)
CO2: 29 mEq/L (ref 19–32)
Calcium: 9.7 mg/dL (ref 8.4–10.5)
Chloride: 103 mEq/L (ref 96–112)
Creatinine, Ser: 1.41 mg/dL — ABNORMAL HIGH (ref 0.40–1.20)
GFR: 39.22 mL/min — ABNORMAL LOW (ref >60.00)
Glucose, Bld: 82 mg/dL (ref 70–99)
Potassium: 4.1 mEq/L (ref 3.5–5.1)
Sodium: 138 mEq/L (ref 135–145)

## 2021-04-03 LAB — TSH: TSH: 1.6 u[IU]/mL (ref 0.35–5.50)

## 2021-04-03 MED ORDER — SHINGRIX 50 MCG/0.5ML IM SUSR: 0.5000 mL | Freq: Once | INTRAMUSCULAR | 1 refills | Status: AC

## 2021-04-03 NOTE — Progress Notes (Signed)
Subjective:  Patient ID: [name], female    DOB: [birth year]  Age: 66 y.o. MRN: [id number]  CC: Annual Exam, Hypertension, and Congestive Heart Failure  This visit occurred during the SARS-CoV-2 public health emergency.  Safety protocols were in place, including screening questions prior to the visit, additional usage of staff PPE, and extensive cleaning of exam room while observing appropriate contact time as indicated for disinfecting solutions.    HPI [name] presents for a CPX and f/up -   She was recently admitted for CP/SOB/wheezing. The workup was unremarkable. The CP has resolved.  Admit date: 02/24/2021 Discharge date: 02/27/2021   Time spent: 25mins, more than 50% time spent on coordination of care.  Case discussed with cardiology    Recommendations for Outpatient Follow-up:  F/u with PCP within a week for hospital discharge follow up, repeat cbc/bmp at follow up F/u with cardiology on 12/15  Outpatient PT referral made for right upper back pain   Discharge Diagnoses:       Active Hospital Problems    Diagnosis Date Noted   Coronary artery disease involving coronary bypass graft of native heart with angina pectoris ([place]) 02/26/2009   Systemic lupus erythematosus ([place]) 02/25/2021   Stage 3a chronic kidney disease ([place])     Chest pain 02/01/2018   S/P MVR (mitral valve repair) 01/30/2017   S/P CABG x 4 01/30/2017   Exertional shortness of breath 08/19/2011   Hypothyroidism 04/08/2011   Mixed hyperlipidemia 01/26/2009   GERD without esophagitis 08/05/2007   Immune thrombocytopenic purpura ([place]) 08/05/2007    Outpatient Medications Prior to Visit  Medication Sig Dispense Refill   albuterol (VENTOLIN HFA) 108 (90 Base) MCG/ACT inhaler Inhale 2 puffs into the lungs every 6 (six) hours as needed for wheezing or shortness of breath. 8 g 0   aspirin 81 MG chewable tablet Chew 1 tablet (81 mg total) by mouth daily.     azaTHIOprine (IMURAN) 50 MG  tablet TAKE 1 TABLET BY MOUTH TWICE A DAY (Patient taking differently: Take 50 mg by mouth in the morning and at bedtime.) 180 tablet 0   cyclobenzaprine (FLEXERIL) 5 MG tablet Take 1 tablet (5 mg total) by mouth 3 (three) times daily as needed. 30 tablet 0   dapagliflozin propanediol (FARXIGA) 10 MG TABS tablet Take 1 tablet (10 mg total) by mouth daily. 90 tablet 2   furosemide (LASIX) 20 MG tablet Take 1 tablet (20 mg total) by mouth daily. 90 tablet 2   hydroxychloroquine (PLAQUENIL) 200 MG tablet Take 200 mg by mouth daily.      isosorbide mononitrate (IMDUR) 30 MG 24 hr tablet Take 1 tablet (30 mg total) by mouth daily. 90 tablet 2   levothyroxine (SYNTHROID) 75 MCG tablet TAKE 1 TABLET BY MOUTH EVERY DAY (Patient taking differently: Take 75 mcg by mouth daily before breakfast.) 90 tablet 0   metoprolol succinate (TOPROL-XL) 50 MG 24 hr tablet TAKE 1 TABLET (50 MG TOTAL) BY MOUTH DAILY. KEEP FOLLOW UP FOR FUTURE REFILLS. (Patient taking differently: Take 50 mg by mouth daily.) 90 tablet 3   nitroGLYCERIN (NITROSTAT) 0.4 MG SL tablet Place 1 tablet (0.4 mg total) under the tongue every 5 (five) minutes as needed for chest pain. 30 tablet 0   oxyCODONE (OXY IR/ROXICODONE) 5 MG immediate release tablet Take 1 tablet (5 mg total) by mouth every 4 (four) hours as needed for moderate pain. 5 tablet 0   pantoprazole (PROTONIX) 40 MG tablet TAKE 1  TABLET BY MOUTH EVERY DAY (Patient taking differently: 40 mg daily.) 90 tablet 3   potassium chloride SA (KLOR-CON M) 20 MEQ tablet Take 1 tablet (20 mEq total) by mouth daily. 30 tablet 0   rosuvastatin (CRESTOR) 20 MG tablet Take 1 tablet (20 mg total) by mouth daily. 90 tablet 3   No facility-administered medications prior to visit.    ROS Review of Systems  Constitutional:  Negative for diaphoresis, fatigue and unexpected weight change.  HENT: Negative.    Eyes: Negative.   Respiratory:  Negative for cough, chest tightness, shortness of breath and  wheezing.   Cardiovascular:  Negative for chest pain, palpitations and leg swelling.  Gastrointestinal:  Negative for abdominal pain, constipation, diarrhea, nausea and vomiting.  Endocrine: Negative.  Negative for cold intolerance and heat intolerance.  Genitourinary: Negative.  Negative for difficulty urinating, dysuria, hematuria and urgency.  Musculoskeletal:  Positive for arthralgias. Negative for myalgias.  Skin: Negative.   Neurological:  Negative for dizziness, weakness, light-headedness and headaches.  Hematological:  Negative for adenopathy. Does not bruise/bleed easily.  Psychiatric/Behavioral: Negative.     Objective:  BP 136/86 (BP Location: Left Arm, Patient Position: Sitting, Cuff Size: Large)    Pulse 68    Temp 97.8 F (36.6 C) (Oral)    Resp 16    Ht 5\' 7"  (1.702 m)    Wt 261 lb (118.4 kg)    SpO2 98%    BMI 40.88 kg/m   BP Readings from Last 3 Encounters:  04/03/21 136/86  03/07/21 122/84  02/27/21 110/73    Wt Readings from Last 3 Encounters:  04/03/21 261 lb (118.4 kg)  03/07/21 267 lb 3.2 oz (121.2 kg)  02/27/21 263 lb 12.8 oz (119.7 kg)    Physical Exam Vitals reviewed.  Constitutional:      Appearance: Normal appearance. She is not ill-appearing.  HENT:     Nose: Nose normal.     Mouth/Throat:     Mouth: Mucous membranes are moist.  Eyes:     General: No scleral icterus.    Conjunctiva/sclera: Conjunctivae normal.  Cardiovascular:     Rate and Rhythm: Normal rate and regular rhythm.     Heart sounds: No murmur heard. Pulmonary:     Effort: Pulmonary effort is normal.     Breath sounds: No stridor. No wheezing, rhonchi or rales.  Abdominal:     General: Abdomen is protuberant. There is no distension.     Palpations: There is no mass.     Tenderness: There is no abdominal tenderness. There is no guarding.     Hernia: No hernia is present.  Musculoskeletal:        General: Normal range of motion.     Cervical back: Neck supple.     Right lower  leg: No edema.     Left lower leg: No edema.  Lymphadenopathy:     Cervical: No cervical adenopathy.  Skin:    General: Skin is warm and dry.  Neurological:     General: No focal deficit present.     Mental Status: She is alert.  Psychiatric:        Mood and Affect: Mood normal.        Behavior: Behavior normal.    Lab Results  Component Value Date   WBC 3.4 (L) 02/26/2021   HGB 12.9 02/26/2021   HCT 38.0 02/26/2021   PLT 160 02/26/2021   GLUCOSE 82 04/03/2021   CHOL 110 02/25/2021   TRIG  36 02/25/2021   HDL 44 02/25/2021   LDLDIRECT 321.8 02/27/2012   LDLCALC 59 02/25/2021   ALT 12 02/26/2021   AST 20 02/26/2021   NA 138 04/03/2021   K 4.1 04/03/2021   CL 103 04/03/2021   CREATININE 1.41 (H) 04/03/2021   BUN 26 (H) 04/03/2021   CO2 29 04/03/2021   TSH 1.60 04/03/2021   INR 1.09 07/01/2017   HGBA1C 5.2 02/25/2021    No results found.  Assessment & Plan:   Denaisha was seen today for annual exam, hypertension and congestive heart failure.  Diagnoses and all orders for this visit:  Hypertension, unspecified type- Her blood pressure is adequately well controlled.  Hypothyroidism, unspecified type- Her TSH is in the normal range.  She will stay on the current dose of T4. -     TSH; Future -     TSH  Stage 3a chronic kidney disease ([place])- Her renal function is stable.  She will avoid nephrotoxic agents. -     Basic metabolic panel; Future -     Basic metabolic panel  Cervical cancer screening -     Ambulatory referral to Gynecology  Morbid obesity [facility])- She is working on her lifestyle modifications.  Chronic heart failure with preserved ejection fraction ([place])- She has a normal volume status today.  Screen for colon cancer -     Cologuard  Coronary artery disease involving coronary bypass graft of native heart with angina pectoris ([place])- She is asymptomatic with this.  Hyperlipidemia LDL goal <70- LDL goal achieved. Doing well on the statin   Need for  shingles vaccine -     Zoster Vaccine Adjuvanted [facility]) injection; Inject 0.5 mLs into the muscle once for 1 dose.   I am having [name] start on Shingrix. I am also having her maintain her hydroxychloroquine, pantoprazole, azaTHIOprine, levothyroxine, metoprolol succinate, albuterol, cyclobenzaprine, aspirin, oxyCODONE, nitroGLYCERIN, potassium chloride SA, rosuvastatin, dapagliflozin propanediol, furosemide, and isosorbide mononitrate.  Meds ordered this encounter  Medications   Zoster Vaccine Adjuvanted [facility]) injection    Sig: Inject 0.5 mLs into the muscle once for 1 dose.    Dispense:  0.5 mL    Refill:  1     Follow-up: Return in about 6 months (around 10/01/2021).  Scarlette Calico, MD

## 2021-04-03 NOTE — Patient Instructions (Signed)

## 2021-04-04 ENCOUNTER — Ambulatory Visit: Payer: BC Managed Care – PPO | Attending: Internal Medicine

## 2021-04-04 DIAGNOSIS — M6281 Muscle weakness (generalized): Secondary | ICD-10-CM | POA: Diagnosis not present

## 2021-04-04 DIAGNOSIS — M545 Low back pain, unspecified: Secondary | ICD-10-CM | POA: Insufficient documentation

## 2021-04-04 DIAGNOSIS — M546 Pain in thoracic spine: Secondary | ICD-10-CM | POA: Diagnosis not present

## 2021-04-04 DIAGNOSIS — R252 Cramp and spasm: Secondary | ICD-10-CM | POA: Diagnosis not present

## 2021-04-04 DIAGNOSIS — G8929 Other chronic pain: Secondary | ICD-10-CM | POA: Diagnosis not present

## 2021-04-04 DIAGNOSIS — M549 Dorsalgia, unspecified: Secondary | ICD-10-CM | POA: Diagnosis not present

## 2021-04-08 DIAGNOSIS — Z0001 Encounter for general adult medical examination with abnormal findings: Secondary | ICD-10-CM | POA: Insufficient documentation

## 2021-04-08 DIAGNOSIS — Z23 Encounter for immunization: Secondary | ICD-10-CM | POA: Insufficient documentation

## 2021-04-08 NOTE — Assessment & Plan Note (Signed)
Exam completed Labs reviewed Vaccines reviewed and updated Cancer screenings addressed Patient education was given. 

## 2021-04-09 ENCOUNTER — Ambulatory Visit (INDEPENDENT_AMBULATORY_CARE_PROVIDER_SITE_OTHER): Payer: Medicare Other | Admitting: Family Medicine

## 2021-04-09 ENCOUNTER — Encounter (INDEPENDENT_AMBULATORY_CARE_PROVIDER_SITE_OTHER): Payer: Self-pay | Admitting: Family Medicine

## 2021-04-09 ENCOUNTER — Other Ambulatory Visit: Payer: Self-pay

## 2021-04-09 VITALS — BP 110/70 | HR 71 | Temp 98.1°F | Ht 66.0 in | Wt 258.0 lb

## 2021-04-09 DIAGNOSIS — E785 Hyperlipidemia, unspecified: Secondary | ICD-10-CM

## 2021-04-09 DIAGNOSIS — R5383 Other fatigue: Secondary | ICD-10-CM

## 2021-04-09 DIAGNOSIS — Z6841 Body Mass Index (BMI) 40.0 and over, adult: Secondary | ICD-10-CM

## 2021-04-09 DIAGNOSIS — I10 Essential (primary) hypertension: Secondary | ICD-10-CM

## 2021-04-09 DIAGNOSIS — I25709 Atherosclerosis of coronary artery bypass graft(s), unspecified, with unspecified angina pectoris: Secondary | ICD-10-CM

## 2021-04-09 DIAGNOSIS — E039 Hypothyroidism, unspecified: Secondary | ICD-10-CM

## 2021-04-09 DIAGNOSIS — R0602 Shortness of breath: Secondary | ICD-10-CM | POA: Diagnosis not present

## 2021-04-09 DIAGNOSIS — Z1331 Encounter for screening for depression: Secondary | ICD-10-CM

## 2021-04-09 DIAGNOSIS — N1831 Chronic kidney disease, stage 3a: Secondary | ICD-10-CM

## 2021-04-09 NOTE — Progress Notes (Signed)
Dear Dr. Loanne Adams,   Thank you for referring [name] to our clinic. The following note includes my evaluation and treatment recommendations.  Chief Complaint:   OBESITY [name] (MR# [id number]) is a 66 y.o. female who presents for evaluation and treatment of obesity and related comorbidities. Current Body mass index is 41.64 kg/m. [name] has been struggling with her weight for many years and has been unsuccessful in either losing weight, maintaining weight loss, or reaching her healthy weight goal.  [name] is currently in the action stage of change and ready to dedicate time achieving and maintaining a healthier weight. [name] is interested in becoming our patient and working on intensive lifestyle modifications including (but not limited to) diet and exercise for weight loss.  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 She is single and lives alone. Pt eats out at least 5 days a week. She craves cereal and snacks on fruit. Pt attributes her weight gain to meds (prednisone) for SLE for 8 years she took 60 mg QD. She doesn't cook and skips 2+ meals a day. Pt usually only eats dinner at a nice restaurant in town.  [name]'s habits were reviewed today and are as follows: her desired weight loss is 108 lbs, she started gaining weight in 2013, her heaviest weight ever was 270 pounds, she is a picky eater and doesn't like to eat healthier foods, she has significant food cravings issues, she skips meals frequently, she is frequently drinking liquids with calories, and she struggles with emotional eating.  Depression Screen [name] Food and Mood (modified PHQ-9) score was 7.  Depression screen PHQ 2/9 04/09/2021  Decreased Interest 2  Down, Depressed, Hopeless 0  PHQ - 2 Score 2  Altered sleeping 2  Tired, decreased energy 3  Change in appetite 0  Feeling bad or failure about yourself  0  Trouble concentrating 0  Moving slowly or fidgety/restless 0  Suicidal  thoughts 0  PHQ-9 Score 7  Difficult doing work/chores Somewhat difficult  Some recent data might be hidden   Subjective:   1. Other fatigue [name] admits to daytime somnolence and admits to waking up still tired. Patent has a history of symptoms of daytime fatigue, morning fatigue, and morning headache. [name] generally gets  6-8  hours of sleep per night, and states that she has poor sleep quality. Snoring is present. Apneic episodes are not present. Epworth Sleepiness Score is 12, but pt reports she was evaluated several years ago and told "mild" and no need for mask. She doesn't feel it's an issue or sleep is a problem for her.   2. Shortness of breath on exertion [name] notes increasing shortness of breath with exercising and seems to be worsening over time with weight gain. She notes getting out of breath sooner with activity than she used to. This has gotten worse recently. [name] denies shortness of breath at rest or orthopnea.  3. Hypertension, unspecified type Asymptomatic currently. Pt has no concerns. Medication: Toprol, Imdur, Lasix  4. Coronary artery disease involving coronary bypass graft of native heart with angina pectoris ([place]) Pt had CABG in 2018 * 4 + mitral valve repair in the past. She sees cardiology. Pt has h/o CHF-diastolic. Medication: Farxiga, Lasix, Imdur, Toprol, nitroglycerin tabs prn  5. Hyperlipidemia LDL goal <70 Fasting lipid panel done 02/25/2021- levels at goal. Medication: Crestor  6. Hypothyroidism, unspecified type Pt is asymptomatic with no concerns. Her TSH was 1.60 on 04/03/2021. TSH  was 5.8 in 01/2017. She takes 75 mcg Synthroid QD.  7. Stage 3a chronic kidney disease (CKD) (HCC) Pt's creatinine 1.41 on 04/03/2021 (with GFR 45) most recently. Due to SLE- sees Dr. Lenna Gilford of [facility].  Assessment/Plan:   Orders Placed This Encounter  Procedures   Insulin, random   VITAMIN D 25 Hydroxy (Vit-D Deficiency, Fractures)    There are no  discontinued medications.   No orders of the defined types were placed in this encounter.    1. Other fatigue [name] does feel that her weight is causing her energy to be lower than it should be. Fatigue may be related to obesity, depression or many other causes. Labs will be ordered, and in the meanwhile, [name] will focus on self care including making healthy food choices, increasing physical activity and focusing on stress reduction. Check labs today. Pt declines sleep evaluation at this time.  - Insulin, random - VITAMIN D 25 Hydroxy (Vit-D Deficiency, Fractures)  2. Shortness of breath on exertion [name] does feel that she gets out of breath more easily that she used to when she exercises. [name]'s shortness of breath appears to be obesity related and exercise induced. She has agreed to work on weight loss and gradually increase exercise to treat her exercise induced shortness of breath. Will continue to monitor closely.  3. Hypertension, unspecified type At goal. [name] is working on healthy weight loss and exercise to improve blood pressure control. We will watch for signs of hypotension as she continues her lifestyle modifications.  4. Coronary artery disease involving coronary bypass graft of native heart with angina pectoris [name]) Medication and treatment plan per cardiology. Follow prudent nutritional plan and eventually we will increase exercise.  5. Hyperlipidemia LDL goal <70 FLP at goal recently. Continue meds per cardiology/PCP. Follow prudent nutritional plan.  6. Hypothyroidism, unspecified type At goal. Continue current treatment plan per PCP.  7. Stage 3a chronic kidney disease (CKD) ([place]) Continue current treatment plan per specialists/PCP. Labs stable.   - Insulin, random - VITAMIN D 25 Hydroxy (Vit-D Deficiency, Fractures)  8. Screening for depression [name] had a positive depression screening. Depression is commonly associated with obesity and often results in  emotional eating behaviors. We will monitor this closely and work on CBT to help improve the non-hunger eating patterns. Referral to Psychology may be required if no improvement is seen as she continues in our clinic.  9. Obesity with current BMI of 41.7  [name] is currently in the action stage of change and her goal is to continue with weight loss efforts. I recommend [name] begin the structured treatment plan as follows:  She has agreed to the Category 1 Plan.  Exercise goals:  As is    Behavioral modification strategies: increasing lean protein intake, decreasing simple carbohydrates, and planning for success.  She was informed of the importance of frequent follow-up visits to maximize her success with intensive lifestyle modifications for her multiple health conditions. She was informed we would discuss her lab results at her next visit unless there is a critical issue that needs to be addressed sooner. [name] agreed to keep her next visit at the agreed upon time to discuss these results.  Objective:   Blood pressure 110/70, pulse 71, temperature 98.1 F (36.7 C), height 5\' 6"  (1.676 m), weight 258 lb (117 kg), SpO2 96 %. Body mass index is 41.64 kg/m.  EKG: Normal sinus rhythm, rate 57- bradycardia (02/25/2021).  Indirect Calorimeter completed today shows a VO2 of 167  and a REE of 1152.  Her calculated basal metabolic rate is 6222 thus her basal metabolic rate is worse than expected.  General: Cooperative, alert, well developed, in no acute distress. HEENT: Conjunctivae and lids unremarkable. Cardiovascular: Regular rhythm.  Lungs: Normal work of breathing. Neurologic: No focal deficits.   Lab Results  Component Value Date   CREATININE 1.41 (H) 04/03/2021   BUN 26 (H) 04/03/2021   NA 138 04/03/2021   K 4.1 04/03/2021   CL 103 04/03/2021   CO2 29 04/03/2021   Lab Results  Component Value Date   ALT 12 02/26/2021   AST 20 02/26/2021   ALKPHOS 66 02/26/2021   BILITOT 0.7  02/26/2021   Lab Results  Component Value Date   HGBA1C 5.2 02/25/2021   HGBA1C 5.6 01/31/2017   No results found for: INSULIN Lab Results  Component Value Date   TSH 1.60 04/03/2021   Lab Results  Component Value Date   CHOL 110 02/25/2021   HDL 44 02/25/2021   LDLCALC 59 02/25/2021   LDLDIRECT 321.8 02/27/2012   TRIG 36 02/25/2021   CHOLHDL 2.5 02/25/2021   Lab Results  Component Value Date   WBC 3.4 (L) 02/26/2021   HGB 12.9 02/26/2021   HCT 38.0 02/26/2021   MCV 100.8 (H) 02/26/2021   PLT 160 02/26/2021   Lab Results  Component Value Date   IRON 386 06/23/2017   TIBC 285 06/23/2017   FERRITIN 58 06/23/2017   Obesity Behavioral Intervention:   Approximately 15 minutes were spent on the discussion below.  ASK: We discussed the diagnosis of obesity with Levada Dy today and Livana agreed to give [name] permission to discuss obesity behavioral modification therapy today.  ASSESS: Reina has the diagnosis of obesity and her BMI today is 41.7. Marielouise is in the action stage of change.   ADVISE: Teddie was educated on the multiple health risks of obesity as well as the benefit of weight loss to improve her health. She was advised of the need for long term treatment and the importance of lifestyle modifications to improve her current health and to decrease her risk of future health problems.  AGREE: Multiple dietary modification options and treatment options were discussed and Jhoana agreed to follow the recommendations documented in the above note.  ARRANGE: Kysa was educated on the importance of frequent visits to treat obesity as outlined per CMS and USPSTF guidelines and agreed to schedule her next follow up appointment today.  Attestation Statements:   Reviewed by clinician on day of visit: allergies, medications, problem list, medical history, surgical history, family history, social history, and previous encounter notes.  Coral Ceo, CMA, am acting as  transcriptionist for [employer], DO.  I have reviewed the above documentation for accuracy and completeness, and I agree with the above. Marjory Sneddon, D.O.  The [place] was signed into law in 2016 which includes the topic of electronic health records.  This provides immediate access to information in MyChart.  This includes consultation notes, operative notes, office notes, lab results and pathology reports.  If you have any questions about what you read please let us know at your next visit so we can discuss your concerns and take corrective action if need be.  We are right here with you.

## 2021-04-10 LAB — INSULIN, RANDOM: INSULIN: 17.2 u[IU]/mL (ref 2.6–24.9)

## 2021-04-10 LAB — VITAMIN D 25 HYDROXY (VIT D DEFICIENCY, FRACTURES): Vit D, 25-Hydroxy: 17.2 ng/mL — ABNORMAL LOW (ref 30.0–100.0)

## 2021-04-11 ENCOUNTER — Other Ambulatory Visit: Payer: Self-pay

## 2021-04-11 ENCOUNTER — Ambulatory Visit: Payer: BC Managed Care – PPO

## 2021-04-11 DIAGNOSIS — R252 Cramp and spasm: Secondary | ICD-10-CM

## 2021-04-11 DIAGNOSIS — M546 Pain in thoracic spine: Secondary | ICD-10-CM

## 2021-04-11 DIAGNOSIS — M6281 Muscle weakness (generalized): Secondary | ICD-10-CM

## 2021-04-11 DIAGNOSIS — M545 Low back pain, unspecified: Secondary | ICD-10-CM

## 2021-04-11 DIAGNOSIS — G8929 Other chronic pain: Secondary | ICD-10-CM | POA: Diagnosis not present

## 2021-04-11 DIAGNOSIS — M549 Dorsalgia, unspecified: Secondary | ICD-10-CM | POA: Diagnosis not present

## 2021-04-11 NOTE — Therapy (Signed)
OUTPATIENT PHYSICAL THERAPY TREATMENT NOTE   Patient Name: [name] MRN: [id number] DOB:1955/07/01, 66 y.o., female Today's Date: 04/11/2021  PCP: Janith Lima, MD REFERRING PROVIDER: Janith Lima, MD   PT End of Session - 04/11/21 1832     Visit Number 2    Number of Visits 13    Date for PT Re-Evaluation 05/18/21    Authorization Type BCBS    PT Start Time 1831    PT Stop Time 1911    PT Time Calculation (min) 40 min    Activity Tolerance Patient tolerated treatment well    Behavior During Therapy [facility] for tasks assessed/performed             Past Medical History:  Diagnosis Date   Back pain    Bilateral swelling of feet and ankles    CAD (coronary artery disease)    a. remote stents/angioplasty. b. s/p CABG 01/2017 with mitral valve annuloplasty.   Chest pain    Constipation    Diastolic heart failure (HCC)    Esophageal reflux    Gallbladder problem    GERD (gastroesophageal reflux disease)    History of MI (myocardial infarction)    Hyperlipidemia    Hypertension    Hypothyroidism    Immune thrombocytopenic purpura ([place])    Joint pain    Kidney problem    Lupus erythematosus    Mitral valve insufficiency and aortic valve insufficiency    Myocardial infarction (HCC)    x 2   Other fatigue    PONV (postoperative nausea and vomiting)    Postoperative atrial fibrillation (HCC)    Renal disease    Severe mitral regurgitation    a. s/p MV annuloplasty 01/2017 at time of CABG.   Shortness of breath on exertion    Sleep apnea    Status post mitral valve annuloplasty    Unspecified disease of pericardium    Past Surgical History:  Procedure Laterality Date   CHOLECYSTECTOMY, LAPAROSCOPIC  2010   CORONARY ARTERY BYPASS GRAFT N/A 01/30/2017   Procedure: CORONARY ARTERY BYPASS GRAFTING (CABG), Times four  using the right saphaneous vein, harvested endoscopicly.  and left internal mammary artery .;  Surgeon: Gaye Pollack, MD;  Location: [facility]  OR;  Service: Open Heart Surgery;  Laterality: N/A;   LEFT HEART CATH AND CORONARY ANGIOGRAPHY N/A 01/23/2017   Procedure: LEFT HEART CATH AND CORONARY ANGIOGRAPHY;  Surgeon: Belva Crome, MD;  Location: [place] CV LAB;  Service: Cardiovascular;  Laterality: N/A;   MITRAL VALVE REPAIR N/A 01/30/2017   Procedure: MITRAL VALVE REPAIR (MVR);  Surgeon: Gaye Pollack, MD;  Location: [place];  Service: Open Heart Surgery;  Laterality: N/A;   plastic surgical repair      dog bite to leg   RIGHT/LEFT HEART CATH AND CORONARY/GRAFT ANGIOGRAPHY N/A 02/26/2021   Procedure: RIGHT/LEFT HEART CATH AND CORONARY/GRAFT ANGIOGRAPHY;  Surgeon: Belva Crome, MD;  Location: [place] CV LAB;  Service: Cardiovascular;  Laterality: N/A;   SPLENECTOMY  5-04   TEE WITHOUT CARDIOVERSION N/A 01/27/2017   Procedure: TRANSESOPHAGEAL ECHOCARDIOGRAM (TEE);  Surgeon: Sanda Klein, MD;  Location: [facility] ENDOSCOPY;  Service: Cardiovascular;  Laterality: N/A;   TEE WITHOUT CARDIOVERSION N/A 01/30/2017   Procedure: TRANSESOPHAGEAL ECHOCARDIOGRAM (TEE);  Surgeon: Gaye Pollack, MD;  Location: [place];  Service: Open Heart Surgery;  Laterality: N/A;   ULTRASOUND GUIDANCE FOR VASCULAR ACCESS  01/23/2017   Procedure: Ultrasound Guidance For Vascular Access;  Surgeon: Tamala Julian,  Lynnell Dike, MD;  Location: [place] CV LAB;  Service: Cardiovascular;;   Patient Active Problem List   Diagnosis Date Noted   Need for shingles vaccine 04/08/2021   Encounter for general adult medical examination with abnormal findings 04/08/2021   Chronic heart failure with preserved ejection fraction ([place]) 04/03/2021   Hyperlipidemia LDL goal <70 04/03/2021   Screen for colon cancer 04/03/2021   Systemic lupus erythematosus ([place]) 02/25/2021   Stage 3a chronic kidney disease ([place])    Hypertension 08/08/2020   Chronic idiopathic constipation 05/31/2020   Morbid obesity ([place]) 05/31/2020   Cervical cancer screening 05/27/2017   Visit for screening  mammogram 05/27/2017   Routine general medical examination at a health care facility 02/18/2017   Postoperative atrial fibrillation ([place]) 02/18/2017   Osteoarthritis of spine with radiculopathy, lumbar region 06/24/2016   Hypothyroidism 04/08/2011   Coronary artery disease involving coronary bypass graft of native heart with angina pectoris ([place]) 02/26/2009   GERD without esophagitis 08/05/2007   Systemic lupus erythematosus with focal and segmental proliferative glomerulonephritis ([place]) 08/05/2007   REFERRING DIAG: M54.9 (ICD-10-CM) - Upper back pain on right side   THERAPY DIAG:  Pain in thoracic spine   Chronic bilateral low back pain without sciatica   Cramp and spasm   Muscle weakness (generalized)   ONSET DATE: chronic; >2 years    SUBJECTIVE:                                                                                                                                                                                            SUBJECTIVE STATEMENT: Patient reports her back still hurts.   PERTINENT HISTORY:  Recent hospitalization 12/5-12/7/22 for acute CHF Systemic Lupus Erythematosus CAD s/p MI 2003 S/p CABG 2018 Atrial fibrillation See PMH above  PAIN:  Are you having pain? Yes VAS scale: 6/10 Pain location: mid back  Pain orientation: Posterior  PAIN TYPE: aching Pain description: constant  Aggravating factors: prolonged sitting Relieving factors: sleep, rest    PRECAUTIONS: None   WEIGHT BEARING RESTRICTIONS No   OBJECTIVE:   *Unless otherwise noted all objective measures were captured on initial evaluation.   DIAGNOSTIC FINDINGS:  Reports 3 spinal MRI in the past year with patient reporting findings of arthritis. Imaging is not on file.    PATIENT SURVEYS:  FOTO 40% function to 51% predicted   SCREENING FOR RED FLAGS: Bowel or bladder incontinence: No Cauda equina syndrome: No   COGNITION:          Overall cognitive status: Within functional  limits for tasks assessed  SENSATION:          Light touch: Appears intact               POSTURE:  Forward head, rounded shoulders    PALPATION: TTP Rt thoracic paraspinals with spasm occurring with palpation.    LUMBARAROM/PROM   A/PROM A/PROM  04/04/2021  Flexion WNL minor LBP  Extension WNL; pulling along back  Right lateral flexion WNL; pulling along back  Left lateral flexion WNL  Right rotation WNL  Left rotation WNL; pain along Rt upper thoracic    (Blank rows = not tested)   LE MMT:   MMT Right 04/04/2021 Left 04/04/2021  Hip flexion 4+ 5  Hip extension      Hip abduction 3+ 4-  Hip adduction      Hip internal rotation      Hip external rotation      Knee flexion      Knee extension      Ankle dorsiflexion      Ankle plantarflexion      Ankle inversion      Ankle eversion       (Blank rows = not tested)     UPPER EXTREMITY AROM/PROM: Shoulder AROM WNL bilaterally with patient reporting discomfort in the midback with functional ER on the RUE.    (Blank rows = not tested)   UPPER EXTREMITY MMT: Rt mid thoracic pain with all MMT on the RUE   MMT Right 04/04/2021 Left 04/04/2021  Shoulder flexion 4 5  Shoulder extension      Shoulder abduction 4 5  Shoulder adduction      Middle trapezius 4- 4-  Lower trapezius      Shoulder ER 4 5  Shoulder IR 4 5  Wrist flexion      Wrist extension      Wrist ulnar deviation      Wrist radial deviation      Wrist pronation      Wrist supination      Grip strength (lbs)      (Blank rows = not tested)     JOINT MOBILITY TESTING:  Pain before restriction thoracic spine PAIVM     LUMBAR SPECIAL TESTS:  (-) SLR        TODAY'S TREATMENT  [facility] Adult PT Treatment:                                                DATE: 04/11/21 Therapeutic Exercise: Sidelying thoracic rotation 1 x 10 each  Supine pelvic tilts 2 x 10  LTR 60 sec Stability ball rollout 2 minutes 3 way  Prone scapular  retraction 2 x 10  Issued HEP  Manual Therapy: Mid/lower T-spine CPAs grade II STM/DTM/ Trigger point release to Rt thoracolumbar paraspinals, rhomboids, trapezius  Issued tennis ball for self soft tissue mobilization.         PATIENT EDUCATION:  Education details: See treatment above  Person educated: Patient Education method: Explanation Education comprehension: verbalized understanding     HOME EXERCISE PROGRAM: Access Code [id number]   ASSESSMENT:   CLINICAL IMPRESSION: Patient arrives with continued high pain levels about the Rt thoracic region. Session focused on issuing initial HEP to include spinal mobility and manual therapy aimed at  reducing musculature restriction to assist in overall pain reduction. She has significant tautness about Rt thoracic  paraspinals, rhomboids, and middle/lower trap with partial release from manual therapy. She will potentially benefit from TPDN at future sessions if tautness remains. She reported a reduction in pain at end of session rated as 3-4/10.     REHAB POTENTIAL: Fair     CLINICAL DECISION MAKING: Stable/uncomplicated   EVALUATION COMPLEXITY: Low     GOALS: Goals reviewed with patient? No   SHORT TERM GOALS:   STG Name Target Date Goal status  1 Patient will be independent and compliant with initial HEP.    Baseline: no time at eval 04/18/2021 INITIAL  2 Patient will report pain <5/10 indicative of improvements in overall condition.  Baseline:  04/25/2021 INITIAL    LONG TERM GOALS:    LTG Name Target Date Goal status  1 Patient will demonstrate 4+/5 Rt shoulder strength without reports of back pain to improve ability to complete lifting and carrying activity.  Baseline: 05/16/2021 INITIAL  2 Patient will demonstrate 4+/5 bilateral middle trap strength to improve scapular stability.  Baseline: 05/16/2021 INITIAL  3 Patient will score at least 51% on FOTO to signify clinically meaningful improvement in functional abilities.     Baseline: 40 05/16/2021 INITIAL  4 Patient will demonstrate at least 4+/5 bilateral hip abductor strength to improve stability about the chain with prolonged walking.  Baseline: 05/16/2021 INITIAL    PLAN: PT FREQUENCY: 2x/week   PT DURATION: 6 weeks   PLANNED INTERVENTIONS: Therapeutic exercises, Therapeutic activity, Neuro Muscular re-education, Balance training, Patient/Family education, Joint mobilization, Dry Needling, Cryotherapy, Moist heat, and Manual therapy   PLAN FOR NEXT SESSION: progress spinal mobility, hip and periscapular strengthening, consider TPDN   Gwendolyn Grant, PT, DPT, ATC 04/11/21 7:13 PM

## 2021-04-11 NOTE — Patient Instructions (Signed)

## 2021-04-17 NOTE — Therapy (Signed)
OUTPATIENT PHYSICAL THERAPY TREATMENT NOTE   Patient Name: [name] MRN: [id number] DOB:Aug 06, 1955, 66 y.o., female Today's Date: 04/18/2021  PCP: Janith Lima, MD REFERRING PROVIDER: Florencia Reasons, MD   PT End of Session - 04/18/21 1831     Visit Number 3    Number of Visits 13    Date for PT Re-Evaluation 05/18/21    Authorization Type [employer]    PT Start Time 1831    PT Stop Time 1911    PT Time Calculation (min) 40 min    Activity Tolerance Patient tolerated treatment well    Behavior During Therapy [facility] for tasks assessed/performed              Past Medical History:  Diagnosis Date   Back pain    Bilateral swelling of feet and ankles    CAD (coronary artery disease)    a. remote stents/angioplasty. b. s/p CABG 01/2017 with mitral valve annuloplasty.   Chest pain    Constipation    Diastolic heart failure (HCC)    Esophageal reflux    Gallbladder problem    GERD (gastroesophageal reflux disease)    History of MI (myocardial infarction)    Hyperlipidemia    Hypertension    Hypothyroidism    Immune thrombocytopenic purpura ([place])    Joint pain    Kidney problem    Lupus erythematosus    Mitral valve insufficiency and aortic valve insufficiency    Myocardial infarction (HCC)    x 2   Other fatigue    PONV (postoperative nausea and vomiting)    Postoperative atrial fibrillation (HCC)    Renal disease    Severe mitral regurgitation    a. s/p MV annuloplasty 01/2017 at time of CABG.   Shortness of breath on exertion    Sleep apnea    Status post mitral valve annuloplasty    Unspecified disease of pericardium    Past Surgical History:  Procedure Laterality Date   CHOLECYSTECTOMY, LAPAROSCOPIC  2010   CORONARY ARTERY BYPASS GRAFT N/A 01/30/2017   Procedure: CORONARY ARTERY BYPASS GRAFTING (CABG), Times four  using the right saphaneous vein, harvested endoscopicly.  and left internal mammary artery .;  Surgeon: Gaye Pollack, MD;  Location: [facility] OR;   Service: Open Heart Surgery;  Laterality: N/A;   LEFT HEART CATH AND CORONARY ANGIOGRAPHY N/A 01/23/2017   Procedure: LEFT HEART CATH AND CORONARY ANGIOGRAPHY;  Surgeon: Belva Crome, MD;  Location: [place] [facility];  Service: Cardiovascular;  Laterality: N/A;   MITRAL VALVE REPAIR N/A 01/30/2017   Procedure: MITRAL VALVE REPAIR (MVR);  Surgeon: Gaye Pollack, MD;  Location: [place];  Service: Open Heart Surgery;  Laterality: N/A;   plastic surgical repair      dog bite to leg   RIGHT/LEFT HEART CATH AND CORONARY/GRAFT ANGIOGRAPHY N/A 02/26/2021   Procedure: RIGHT/LEFT HEART CATH AND CORONARY/GRAFT ANGIOGRAPHY;  Surgeon: Belva Crome, MD;  Location: [place] [facility];  Service: Cardiovascular;  Laterality: N/A;   SPLENECTOMY  5-04   TEE WITHOUT CARDIOVERSION N/A 01/27/2017   Procedure: TRANSESOPHAGEAL ECHOCARDIOGRAM (TEE);  Surgeon: Sanda Klein, MD;  Location: [facility];  Service: Cardiovascular;  Laterality: N/A;   TEE WITHOUT CARDIOVERSION N/A 01/30/2017   Procedure: TRANSESOPHAGEAL ECHOCARDIOGRAM (TEE);  Surgeon: Gaye Pollack, MD;  Location: [place];  Service: Open Heart Surgery;  Laterality: N/A;   ULTRASOUND GUIDANCE FOR VASCULAR ACCESS  01/23/2017   Procedure: Ultrasound Guidance For Vascular Access;  Surgeon: Tamala Julian,  Lynnell Dike, MD;  Location: [place] [facility];  Service: Cardiovascular;;   Patient Active Problem List   Diagnosis Date Noted   Need for shingles vaccine 04/08/2021   Encounter for general adult medical examination with abnormal findings 04/08/2021   Chronic heart failure with preserved ejection fraction ([place]) 04/03/2021   Hyperlipidemia LDL goal <70 04/03/2021   Screen for colon cancer 04/03/2021   Systemic lupus erythematosus ([place]) 02/25/2021   Stage 3a chronic kidney disease ([place])    Hypertension 08/08/2020   Chronic idiopathic constipation 05/31/2020   Morbid obesity ([place]) 05/31/2020   Cervical cancer screening 05/27/2017   Visit for screening mammogram  05/27/2017   Routine general medical examination at a health care facility 02/18/2017   Postoperative atrial fibrillation ([place]) 02/18/2017   Osteoarthritis of spine with radiculopathy, lumbar region 06/24/2016   Hypothyroidism 04/08/2011   Coronary artery disease involving coronary bypass graft of native heart with angina pectoris ([place]) 02/26/2009   GERD without esophagitis 08/05/2007   Systemic lupus erythematosus with focal and segmental proliferative glomerulonephritis ([place]) 08/05/2007   REFERRING DIAG: M54.9 (ICD-10-CM) - Upper back pain on right side   THERAPY DIAG:  Pain in thoracic spine   Chronic bilateral low back pain without sciatica   Cramp and spasm   Muscle weakness (generalized)   ONSET DATE: chronic; >2 years    SUBJECTIVE:                                                                                                                                                                                            SUBJECTIVE STATEMENT: Patient reports her back is still tight. She had a massage which did provide some short term relief. She reports compliance with HEP.   PERTINENT HISTORY:  Recent hospitalization 12/5-12/7/22 for acute CHF Systemic Lupus Erythematosus CAD s/p MI 2003 S/p CABG 2018 Atrial fibrillation See PMH above  PAIN:  Are you having pain? Yes VAS scale: 4/10 Pain location: Rt mid back  Pain orientation: Posterior  PAIN TYPE: dull ache  Pain description: constant  Aggravating factors: walking, sitting  Relieving factors: sleep, rest    PRECAUTIONS: None   WEIGHT BEARING RESTRICTIONS No   OBJECTIVE:   *Unless otherwise noted all objective measures were captured on initial evaluation.   DIAGNOSTIC FINDINGS:  Reports 3 spinal MRI in the past year with patient reporting findings of arthritis. Imaging is not on file.    PATIENT SURVEYS:  FOTO 40% function to 51% predicted   SCREENING FOR RED FLAGS: Bowel or bladder incontinence:  No Cauda equina syndrome: No   COGNITION:  Overall cognitive status: Within functional limits for tasks assessed                        SENSATION:          Light touch: Appears intact               POSTURE:  Forward head, rounded shoulders    PALPATION: TTP Rt thoracic paraspinals with spasm occurring with palpation.    LUMBARAROM/PROM   A/PROM A/PROM  04/04/2021  Flexion WNL minor LBP  Extension WNL; pulling along back  Right lateral flexion WNL; pulling along back  Left lateral flexion WNL  Right rotation WNL  Left rotation WNL; pain along Rt upper thoracic    (Blank rows = not tested)   LE MMT:   MMT Right 04/04/2021 Left 04/04/2021  Hip flexion 4+ 5  Hip extension      Hip abduction 3+ 4-  Hip adduction      Hip internal rotation      Hip external rotation      Knee flexion      Knee extension      Ankle dorsiflexion      Ankle plantarflexion      Ankle inversion      Ankle eversion       (Blank rows = not tested)     UPPER EXTREMITY AROM/PROM: Shoulder AROM WNL bilaterally with patient reporting discomfort in the midback with functional ER on the RUE.    (Blank rows = not tested)   UPPER EXTREMITY MMT: Rt mid thoracic pain with all MMT on the RUE   MMT Right 04/04/2021 Left 04/04/2021 04/18/21  Shoulder flexion 4 5 Rt: 4+/5 Lt 5/5   Shoulder extension       Shoulder abduction 4 5   Shoulder adduction       Middle trapezius 4- 4-   Lower trapezius       Shoulder ER 4 5   Shoulder IR 4 5   Wrist flexion       Wrist extension       Wrist ulnar deviation       Wrist radial deviation       Wrist pronation       Wrist supination       Grip strength (lbs)       (Blank rows = not tested)     JOINT MOBILITY TESTING:  Pain before restriction thoracic spine PAIVM     LUMBAR SPECIAL TESTS:  (-) SLR        TODAY'S TREATMENT  [facility] Adult PT Treatment:                                                DATE: 04/18/21 Therapeutic Exercise: UBE  level 1.5 2 min each fwd/bwd  Resisted shoulder extension 2 x 15 green band  Resisted rows blue band 2 x 15   Manual Therapy: STM/DTM/ Trigger point release to Rt thoracolumbar paraspinals, rhomboids, trapezius  Rt scapular mobilization inferior and superior and upward/downward rotation  Reviewed tennis ball for self soft tissue mobilization recommending to hold for 20-30 seconds over active trigger points.  Trigger Point Dry Needling Treatment: Pre-treatment instruction: Patient instructed on dry needling rationale, procedures, and possible side effects including pain during treatment (achy,cramping feeling), bruising, drop of blood, lightheadedness, nausea,  sweating. Patient Consent Given: Yes Education handout provided: Yes Muscles treated: Rt rhomboid major/minor shelf technique   Treatment response/outcome: Palpable decrease in muscle tension Post-treatment instructions: Patient instructed to expect possible mild to moderate muscle soreness later today and/or tomorrow. Patient instructed in methods to reduce muscle soreness and to continue prescribed HEP. If patient was dry needled over the lung field, patient was instructed on signs and symptoms of pneumothorax and, however unlikely, to see immediate medical attention should they occur. Patient was also educated on signs and symptoms of infection and to seek medical attention should they occur. Patient verbalized understanding of these instructions and education.   [facility] Adult PT Treatment:                                                DATE: 04/11/21 Therapeutic Exercise: Sidelying thoracic rotation 1 x 10 each  Supine pelvic tilts 2 x 10  LTR 60 sec Stability ball rollout 2 minutes 3 way  Prone scapular retraction 2 x 10  Issued HEP  Manual Therapy: Mid/lower T-spine CPAs grade II STM/DTM/ Trigger point release to Rt thoracolumbar paraspinals, rhomboids, trapezius  Issued tennis ball for self soft tissue mobilization.          PATIENT EDUCATION:  Education details: See treatment above  Person educated: Patient Education method: Explanation, handout provided  Education comprehension: verbalized understanding     HOME EXERCISE PROGRAM: Access Code [email or web address]   ASSESSMENT:   CLINICAL IMPRESSION:  Patient has significant trigger points present about Rt rhomboids and thoracic paraspinals. TPDN was performed to rhomboids with a palpable decrease in muscle tension noted, though patient continues to remain fairly tender to palpation along musculature. Able to progress periscapular strengthening with patient requiring frequent postural cues, though no increased pain with strengthening. No change in pain at end of session.    REHAB POTENTIAL: Fair     CLINICAL DECISION MAKING: Stable/uncomplicated   EVALUATION COMPLEXITY: Low     GOALS: Goals reviewed with patient? No   SHORT TERM GOALS:   STG Name Target Date Goal status  1 Patient will be independent and compliant with initial HEP.    Baseline: no time at eval 04/18/2021 Achieved   2 Patient will report pain <5/10 indicative of improvements in overall condition.  Baseline:  04/25/2021 INITIAL    LONG TERM GOALS:    LTG Name Target Date Goal status  1 Patient will demonstrate 4+/5 Rt shoulder strength without reports of back pain to improve ability to complete lifting and carrying activity.  Baseline: 05/16/2021 INITIAL  2 Patient will demonstrate 4+/5 bilateral middle trap strength to improve scapular stability.  Baseline: 05/16/2021 INITIAL  3 Patient will score at least 51% on FOTO to signify clinically meaningful improvement in functional abilities.    Baseline: 40 05/16/2021 INITIAL  4 Patient will demonstrate at least 4+/5 bilateral hip abductor strength to improve stability about the chain with prolonged walking.  Baseline: 05/16/2021 INITIAL    PLAN: PT FREQUENCY: 2x/week   PT DURATION: 6 weeks   PLANNED INTERVENTIONS: Therapeutic exercises,  Therapeutic activity, Neuro Muscular re-education, Balance training, Patient/Family education, Joint mobilization, Dry Needling, Cryotherapy, Moist heat, and Manual therapy   PLAN FOR NEXT SESSION: progress spinal mobility, hip and periscapular strengthening, response to TPDN   Gwendolyn Grant, PT, DPT, ATC 04/18/21 7:12 PM

## 2021-04-18 ENCOUNTER — Other Ambulatory Visit: Payer: Self-pay | Admitting: Internal Medicine

## 2021-04-18 ENCOUNTER — Ambulatory Visit: Payer: BC Managed Care – PPO

## 2021-04-18 ENCOUNTER — Other Ambulatory Visit: Payer: Self-pay

## 2021-04-18 DIAGNOSIS — R252 Cramp and spasm: Secondary | ICD-10-CM

## 2021-04-18 DIAGNOSIS — G8929 Other chronic pain: Secondary | ICD-10-CM

## 2021-04-18 DIAGNOSIS — M546 Pain in thoracic spine: Secondary | ICD-10-CM

## 2021-04-18 DIAGNOSIS — M545 Low back pain, unspecified: Secondary | ICD-10-CM | POA: Diagnosis not present

## 2021-04-18 DIAGNOSIS — M549 Dorsalgia, unspecified: Secondary | ICD-10-CM | POA: Diagnosis not present

## 2021-04-18 DIAGNOSIS — M6281 Muscle weakness (generalized): Secondary | ICD-10-CM

## 2021-04-18 NOTE — Patient Instructions (Signed)

## 2021-04-22 NOTE — Therapy (Signed)
OUTPATIENT PHYSICAL THERAPY TREATMENT NOTE   Patient Name: [name] MRN: [id number] DOB:1955-08-19, 66 y.o., female Today's Date: 04/25/2021  PCP: Janith Lima, MD REFERRING PROVIDER: Florencia Reasons, MD   PT End of Session - 04/25/21 1827     Visit Number 4    Number of Visits 13    Date for PT Re-Evaluation 05/18/21    Authorization Type BCBS    PT Start Time 1827    PT Stop Time 1908   2 min TPDN   PT Time Calculation (min) 41 min    Activity Tolerance Patient tolerated treatment well    Behavior During Therapy [facility] for tasks assessed/performed               Past Medical History:  Diagnosis Date   Back pain    Bilateral swelling of feet and ankles    CAD (coronary artery disease)    a. remote stents/angioplasty. b. s/p CABG 01/2017 with mitral valve annuloplasty.   Chest pain    Constipation    Diastolic heart failure (HCC)    Esophageal reflux    Gallbladder problem    GERD (gastroesophageal reflux disease)    History of MI (myocardial infarction)    Hyperlipidemia    Hypertension    Hypothyroidism    Immune thrombocytopenic purpura ([place])    Joint pain    Kidney problem    Lupus erythematosus    Mitral valve insufficiency and aortic valve insufficiency    Myocardial infarction (HCC)    x 2   Other fatigue    PONV (postoperative nausea and vomiting)    Postoperative atrial fibrillation (HCC)    Renal disease    Severe mitral regurgitation    a. s/p MV annuloplasty 01/2017 at time of CABG.   Shortness of breath on exertion    Sleep apnea    Status post mitral valve annuloplasty    Unspecified disease of pericardium    Past Surgical History:  Procedure Laterality Date   CHOLECYSTECTOMY, LAPAROSCOPIC  2010   CORONARY ARTERY BYPASS GRAFT N/A 01/30/2017   Procedure: CORONARY ARTERY BYPASS GRAFTING (CABG), Times four  using the right saphaneous vein, harvested endoscopicly.  and left internal mammary artery .;  Surgeon: Gaye Pollack, MD;   Location: [facility] OR;  Service: Open Heart Surgery;  Laterality: N/A;   LEFT HEART CATH AND CORONARY ANGIOGRAPHY N/A 01/23/2017   Procedure: LEFT HEART CATH AND CORONARY ANGIOGRAPHY;  Surgeon: Belva Crome, MD;  Location: [place] [facility];  Service: Cardiovascular;  Laterality: N/A;   MITRAL VALVE REPAIR N/A 01/30/2017   Procedure: MITRAL VALVE REPAIR (MVR);  Surgeon: Gaye Pollack, MD;  Location: [place];  Service: Open Heart Surgery;  Laterality: N/A;   plastic surgical repair      dog bite to leg   RIGHT/LEFT HEART CATH AND CORONARY/GRAFT ANGIOGRAPHY N/A 02/26/2021   Procedure: RIGHT/LEFT HEART CATH AND CORONARY/GRAFT ANGIOGRAPHY;  Surgeon: Belva Crome, MD;  Location: [place] [facility];  Service: Cardiovascular;  Laterality: N/A;   SPLENECTOMY  5-04   TEE WITHOUT CARDIOVERSION N/A 01/27/2017   Procedure: TRANSESOPHAGEAL ECHOCARDIOGRAM (TEE);  Surgeon: Sanda Klein, MD;  Location: [facility] ENDOS[facility];  Service: Cardiovascular;  Laterality: N/A;   TEE WITHOUT CARDIOVERSION N/A 01/30/2017   Procedure: TRANSESOPHAGEAL ECHOCARDIOGRAM (TEE);  Surgeon: Gaye Pollack, MD;  Location: [place];  Service: Open Heart Surgery;  Laterality: N/A;   ULTRASOUND GUIDANCE FOR VASCULAR ACCESS  01/23/2017   Procedure: Ultrasound Guidance For  Vascular Access;  Surgeon: Belva Crome, MD;  Location: [place] [facility];  Service: Cardiovascular;;   Patient Active Problem List   Diagnosis Date Noted   Need for shingles vaccine 04/08/2021   Encounter for general adult medical examination with abnormal findings 04/08/2021   Chronic heart failure with preserved ejection fraction ([place]) 04/03/2021   Hyperlipidemia LDL goal <70 04/03/2021   Screen for colon cancer 04/03/2021   Systemic lupus erythematosus ([place]) 02/25/2021   Stage 3a chronic kidney disease ([place])    Hypertension 08/08/2020   Chronic idiopathic constipation 05/31/2020   Morbid obesity ([place]) 05/31/2020   Cervical cancer screening 05/27/2017   Visit for  screening mammogram 05/27/2017   Routine general medical examination at a health care facility 02/18/2017   Postoperative atrial fibrillation ([place]) 02/18/2017   Osteoarthritis of spine with radiculopathy, lumbar region 06/24/2016   Hypothyroidism 04/08/2011   Coronary artery disease involving coronary bypass graft of native heart with angina pectoris ([place]) 02/26/2009   GERD without esophagitis 08/05/2007   Systemic lupus erythematosus with focal and segmental proliferative glomerulonephritis ([place]) 08/05/2007   REFERRING DIAG: M54.9 (ICD-10-CM) - Upper back pain on right side   THERAPY DIAG:  Pain in thoracic spine   Chronic bilateral low back pain without sciatica   Cramp and spasm   Muscle weakness (generalized)   ONSET DATE: chronic; >2 years    SUBJECTIVE:                                                                                                                                                                                            SUBJECTIVE STATEMENT: "It's not as bad today, but it's still there." She reports the pain is not occurring daily.   PERTINENT HISTORY:  Recent hospitalization 12/5-12/7/22 for acute CHF Systemic Lupus Erythematosus CAD s/p MI 2003 S/p CABG 2018 Atrial fibrillation See PMH above  PAIN:  Are you having pain? Yes VAS scale: 4/10 Pain location: Rt mid back  Pain orientation: Posterior  PAIN TYPE: dull ache  Pain description: intermittent  Aggravating factors: walking, sitting  Relieving factors: sleep, rest    PRECAUTIONS: None   WEIGHT BEARING RESTRICTIONS No   OBJECTIVE:   *Unless otherwise noted all objective measures were captured on initial evaluation.   DIAGNOSTIC FINDINGS:  Reports 3 spinal MRI in the past year with patient reporting findings of arthritis. Imaging is not on file.    PATIENT SURVEYS:  FOTO 40% function to 51% predicted   SCREENING FOR RED FLAGS: Bowel or bladder incontinence: No Cauda equina syndrome:  No   COGNITION:  Overall cognitive status: Within functional limits for tasks assessed                        SENSATION:          Light touch: Appears intact               POSTURE:  Forward head, rounded shoulders    PALPATION: TTP Rt thoracic paraspinals with spasm occurring with palpation.    LUMBARAROM/PROM   A/PROM A/PROM  04/04/2021  Flexion WNL minor LBP  Extension WNL; pulling along back  Right lateral flexion WNL; pulling along back  Left lateral flexion WNL  Right rotation WNL  Left rotation WNL; pain along Rt upper thoracic    (Blank rows = not tested)   LE MMT:   MMT Right 04/04/2021 Left 04/04/2021  Hip flexion 4+ 5  Hip extension      Hip abduction 3+ 4-  Hip adduction      Hip internal rotation      Hip external rotation      Knee flexion      Knee extension      Ankle dorsiflexion      Ankle plantarflexion      Ankle inversion      Ankle eversion       (Blank rows = not tested)     UPPER EXTREMITY AROM/PROM: Shoulder AROM WNL bilaterally with patient reporting discomfort in the midback with functional ER on the RUE.    (Blank rows = not tested)   UPPER EXTREMITY MMT: Rt mid thoracic pain with all MMT on the RUE   MMT Right 04/04/2021 Left 04/04/2021 04/18/21 04/25/21  Shoulder flexion 4 5 Rt: 4+/5 Lt 5/5    Shoulder extension        Shoulder abduction 4 5    Shoulder adduction        Middle trapezius 4- 4-  Lt: 4+/5 Rt: 4/5   Lower trapezius        Shoulder ER 4 5    Shoulder IR 4 5    Wrist flexion        Wrist extension        Wrist ulnar deviation        Wrist radial deviation        Wrist pronation        Wrist supination        Grip strength (lbs)        (Blank rows = not tested)     JOINT MOBILITY TESTING:  Pain before restriction thoracic spine PAIVM     LUMBAR SPECIAL TESTS:  (-) SLR        TODAY'S TREATMENT  [facility] Adult PT Treatment:                                                DATE: 04/25/21 Therapeutic  Exercise: UBE level 2; 2 min each fwd/bwd  Seated thoracic extension 1 x 10  Rhomboid doorway stretch 30 sec Stability ball wall walk ups 1 x 10  Prone T 2 x 15 Rows green band 2 x 15  Resisted horizontal abduction green band 2 x 10  Updated HEP  Manual Therapy: STM/DTM Rt thoracolumbar paraspinals, rhomboids, subscapularis Mid T-spine CPAs grade II-III  Trigger Point Dry Needling Treatment: Pre-treatment instruction: Patient instructed on dry  needling rationale, procedures, and possible side effects including pain during treatment (achy,cramping feeling), bruising, drop of blood, lightheadedness, nausea, sweating. Patient Consent Given: Yes Education handout provided: Previously provided Muscles treated: Rt rhomboid shelf technique; Rt subscapularis   Treatment response/outcome: Twitch response elicited and Palpable decrease in muscle tension Post-treatment instructions: Patient instructed to expect possible mild to moderate muscle soreness later today and/or tomorrow. Patient instructed in methods to reduce muscle soreness and to continue prescribed HEP. If patient was dry needled over the lung field, patient was instructed on signs and symptoms of pneumothorax and, however unlikely, to see immediate medical attention should they occur. Patient was also educated on signs and symptoms of infection and to seek medical attention should they occur. Patient verbalized understanding of these instructions and education.    [facility] Adult PT Treatment:                                                DATE: 04/18/21 Therapeutic Exercise: UBE level 1.5 2 min each fwd/bwd  Resisted shoulder extension 2 x 15 green band  Resisted rows blue band 2 x 15   Manual Therapy: STM/DTM/ Trigger point release to Rt thoracolumbar paraspinals, rhomboids, trapezius  Rt scapular mobilization inferior and superior and upward/downward rotation  Reviewed tennis ball for self soft tissue mobilization recommending to hold  for 20-30 seconds over active trigger points.  Trigger Point Dry Needling Treatment: Pre-treatment instruction: Patient instructed on dry needling rationale, procedures, and possible side effects including pain during treatment (achy,cramping feeling), bruising, drop of blood, lightheadedness, nausea, sweating. Patient Consent Given: Yes Education handout provided: Yes Muscles treated: Rt rhomboid major/minor shelf technique   Treatment response/outcome: Palpable decrease in muscle tension Post-treatment instructions: Patient instructed to expect possible mild to moderate muscle soreness later today and/or tomorrow. Patient instructed in methods to reduce muscle soreness and to continue prescribed HEP. If patient was dry needled over the lung field, patient was instructed on signs and symptoms of pneumothorax and, however unlikely, to see immediate medical attention should they occur. Patient was also educated on signs and symptoms of infection and to seek medical attention should they occur. Patient verbalized understanding of these instructions and education.   [facility] Adult PT Treatment:                                                DATE: 04/11/21 Therapeutic Exercise: Sidelying thoracic rotation 1 x 10 each  Supine pelvic tilts 2 x 10  LTR 60 sec Stability ball rollout 2 minutes 3 way  Prone scapular retraction 2 x 10  Issued HEP  Manual Therapy: Mid/lower T-spine CPAs grade II STM/DTM/ Trigger point release to Rt thoracolumbar paraspinals, rhomboids, trapezius  Issued tennis ball for self soft tissue mobilization.         PATIENT EDUCATION:  Education details: See treatment above  Person educated: Patient Education method: Explanation, handout provided, demo  Education comprehension: verbalized understanding, returned demo     HOME EXERCISE PROGRAM: Access Code [email or web address]   ASSESSMENT:   CLINICAL IMPRESSION:  Patient arrives with overall reduction in Rt thoracic pain compared  to last session. She is noted to have lingering trigger points in Rt rhomboid and significant tautness in subscapularis  noted. TPDN was performed with notable twitch in rhomboid and decreased tautness/tenderness in both muscle groups. Continued to progress periscapular strengthening with patient requiring moderate postural cues to decrease upper trap engagement. Middle trap strength has improved bilaterally compared to initial evaluation. No reports of increased pain throughout session with patient reporting a reduction in Rt thoracic pain at end of session rated as 2-3/10.    REHAB POTENTIAL: Fair     CLINICAL DECISION MAKING: Stable/uncomplicated   EVALUATION COMPLEXITY: Low     GOALS: Goals reviewed with patient? No   SHORT TERM GOALS:   STG Name Target Date Goal status  1 Patient will be independent and compliant with initial HEP.    Baseline: no time at eval 04/18/2021 Achieved   2 Patient will report pain <5/10 indicative of improvements in overall condition.  Baseline:  04/25/2021 Achieved      LONG TERM GOALS:    LTG Name Target Date Goal status  1 Patient will demonstrate 4+/5 Rt shoulder strength without reports of back pain to improve ability to complete lifting and carrying activity.  Baseline: 05/16/2021 INITIAL  2 Patient will demonstrate 4+/5 bilateral middle trap strength to improve scapular stability.  Baseline: 05/16/2021 Partially met   3 Patient will score at least 51% on FOTO to signify clinically meaningful improvement in functional abilities.    Baseline: 40 05/16/2021 INITIAL  4 Patient will demonstrate at least 4+/5 bilateral hip abductor strength to improve stability about the chain with prolonged walking.  Baseline: 05/16/2021 INITIAL    PLAN: PT FREQUENCY: 2x/week   PT DURATION: 6 weeks   PLANNED INTERVENTIONS: Therapeutic exercises, Therapeutic activity, Neuro Muscular re-education, Balance training, Patient/Family education, Joint mobilization, Dry  Needling, Cryotherapy, Moist heat, and Manual therapy   PLAN FOR NEXT SESSION: progress spinal mobility, hip and periscapular strengthening, response to TPDN   Gwendolyn Grant, PT, DPT, ATC 04/25/21 7:10 PM

## 2021-04-23 ENCOUNTER — Ambulatory Visit (INDEPENDENT_AMBULATORY_CARE_PROVIDER_SITE_OTHER): Payer: Medicare Other | Admitting: Family Medicine

## 2021-04-23 ENCOUNTER — Encounter (INDEPENDENT_AMBULATORY_CARE_PROVIDER_SITE_OTHER): Payer: Self-pay | Admitting: Family Medicine

## 2021-04-23 ENCOUNTER — Other Ambulatory Visit: Payer: Self-pay

## 2021-04-23 VITALS — BP 102/69 | HR 68 | Temp 98.0°F | Ht 66.0 in | Wt 259.0 lb

## 2021-04-23 DIAGNOSIS — E7849 Other hyperlipidemia: Secondary | ICD-10-CM

## 2021-04-23 DIAGNOSIS — N183 Chronic kidney disease, stage 3 unspecified: Secondary | ICD-10-CM | POA: Diagnosis not present

## 2021-04-23 DIAGNOSIS — E559 Vitamin D deficiency, unspecified: Secondary | ICD-10-CM

## 2021-04-23 DIAGNOSIS — Z6841 Body Mass Index (BMI) 40.0 and over, adult: Secondary | ICD-10-CM

## 2021-04-23 DIAGNOSIS — I10 Essential (primary) hypertension: Secondary | ICD-10-CM | POA: Diagnosis not present

## 2021-04-23 DIAGNOSIS — E8881 Metabolic syndrome: Secondary | ICD-10-CM | POA: Diagnosis not present

## 2021-04-23 DIAGNOSIS — E669 Obesity, unspecified: Secondary | ICD-10-CM | POA: Diagnosis not present

## 2021-04-23 DIAGNOSIS — I5032 Chronic diastolic (congestive) heart failure: Secondary | ICD-10-CM | POA: Diagnosis not present

## 2021-04-23 MED ORDER — VITAMIN D (ERGOCALCIFEROL) 1.25 MG (50000 UNIT) PO CAPS
50000.0000 [IU] | ORAL_CAPSULE | ORAL | 0 refills | Status: DC
Start: 2021-04-23 — End: 2021-05-20

## 2021-04-24 NOTE — Progress Notes (Signed)
Chief Complaint:   OBESITY [name] is here to discuss her progress with her obesity treatment plan along with follow-up of her obesity related diagnoses. [name] is on the Category 1 Plan and states she is following her eating plan approximately 60% of the time. [name] states she is not currently exercising.  Today's visit was #: 2 Starting weight: 258 lbs Starting date: 04/09/2021 Today's weight: 259 lbs Today's date: 04/23/2021 Total lbs lost to date: 0 Total lbs lost since last in-office visit: +1  Interim History: [name] is here today for her first follow-up office visit since starting the program with [name].  All blood work/ lab tests that were recently ordered by myself or an outside provider were reviewed with patient today per their request.   Extended time was spent counseling her on all new disease processes that were discovered or preexisting ones that are affected by BMI.  she understands that many of these abnormalities will need to monitored regularly along with the current treatment plan of prudent dietary changes, in which we are making each and every office visit, to improve these health parameters.  Pt drinks 40 oz of water per day.  Chaplain- works Medical laboratory scientific officer.  She doesn't cook and mostly skips 2 meals a day.  Pt usually only eats dinner - at a nice restaurant in town - 5+d / week.  She states that these habits are hard to break.   Yesterday, she did pretty good and endorses eating:  Breakfast= 1-2 pieces toast, 1 bowl oatmeal, bowl fruit Lunch= 1 extreme wellness wrap; a couple slices of [place] or chicken (not weighing protein), 1 bowl fruit Dinner= rotisserie chicken (? amount) and broccoli (? amount)  Some days she has hunger and some days, it is difficult to get the foods in    Subjective:   1. Essential hypertension  w/ stage 3b CKD  w/ chronic heart failure with preserved EF Discussed labs with patient today. - H/o diastolic CHF w preserved EF.  Cards  recently added farxiga for "presumed diastolic heart failure" on cath report 02/26/21.   Echo 02/25/21 with low normal EF 50-55% - H/o CABG * 4 with MVR 11/18;   - Risk Factor's: lupus, HTN, HLD - Pt Asymptomatic; without concerns. Pt denies orthopnea, no PNP, no new SOB.  Medication: Farxiga, Lasix, Imdur, Toprol, Klor-Con per Cards  2. Other hyperlipidemia Discussed labs with patient today. Pt has no issues with medication or concerns. She is not eating a lot of fast foods currently.  3. Insulin resistance New. Discussed labs with patient today. Pt's A1c is within normal limits and fasting insulin is elevated.  4. Vitamin D deficiency New. Discussed labs with patient today. Bone density done 2020 at GYN office. No issues, per pt.  Hasn't had one since.     Assessment/Plan:   Meds ordered this encounter  Medications   Vitamin D, Ergocalciferol, (DRISDOL) 1.25 MG (50000 UNIT) CAPS capsule    Sig: Take 1 capsule (50,000 Units total) by mouth every 7 (seven) days.    Dispense:  4 capsule    Refill:  0    30 d supply;  ** OV for RF **   Do not send RF request     1. Essential hypertension  w/ stage 3b CKD  w/ chronic heart failure with preserved EF BP low normal today but stable. Pt Asx.  - Serum crt 1.41 and GFR 39 - 04/03/21. Stable, as was 1.45 one mo ago -  Continue meds & mgt per cardiology.   - We will monitor BP closely during pt's wt loss journey - Pt feels she is on fluid restrictions per her Cards team for "presumed diastolic heart failure" on cath report 02/26/21  and with recent echo 02/25/21: EF 50-55%.   Pt will contact cardiology regarding any fluid restrictions pt may have just to be safe. (? How many oz water per day) . Per last cardiology notes, no fluid restrictions were recommended (notes reviewed myself).  - Cards recently added farxiga for "presumed diastolic heart failure" on cath report 02/26/21 - H/o CABG with MVR 11/18;  RF's: lupus, HTN, HLD  2. Other  hyperlipidemia Counseling done Labs stable and LDL at goal: 59. TG: 36 Management per cardiology/pcp.  - Continue Crestor - follow prudent nutritional plan, and eventually increase exercise.  3. Insulin resistance New. Due to CKD, I do not recommend Metformin, unless pt's nephrologist recommends it. Continue prudent nutritional plan and weight loss.  4. Vitamin D deficiency Well below goal:  at 59. Low Vitamin D level contributes to fatigue and are associated with obesity, breast, and colon cancer.  She agrees to Start to take prescription Vitamin D 50,000 IU every week and will follow-up for routine testing of Vitamin D, at least 2-3 times per year to avoid over-replacement.  - disease counseling done -->  Start every 2 year bone density osteoporosis screenings due to age.  --> Weight bearing exercises recommended.  Start- Vitamin D, Ergocalciferol, (DRISDOL) 1.25 MG (50000 UNIT) CAPS capsule; Take 1 capsule (50,000 Units total) by mouth every 7 (seven) days.  Dispense: 4 capsule; Refill: 0   5. Obesity with current BMI of 41.9 Initially referred to [name] by Renato Shin of Endo for wt gain. [name] is currently in the action stage of change. As such, her goal is to continue with weight loss efforts. She has agreed to the Category 1 Plan with breakfast options.   Exercise goals:  As is  Behavioral modification strategies: increasing lean protein intake, decreasing simple carbohydrates, and planning for success.  [name] has agreed to follow-up with our clinic in 2 weeks. She was informed of the importance of frequent follow-up visits to maximize her success with intensive lifestyle modifications for her multiple health conditions.     Objective:   Blood pressure 102/69, pulse 68, temperature 98 F (36.7 C), height 5\' 6"  (1.676 m), weight 259 lb (117.5 kg), SpO2 96 %. Body mass index is 41.8 kg/m.  General: Cooperative, alert, well developed, in no acute distress. HEENT:  Conjunctivae and lids unremarkable. Cardiovascular: Regular rhythm.  Lungs: Normal work of breathing. Neurologic: No focal deficits.   Lab Results  Component Value Date   CREATININE 1.41 (H) 04/03/2021   BUN 26 (H) 04/03/2021   NA 138 04/03/2021   K 4.1 04/03/2021   CL 103 04/03/2021   CO2 29 04/03/2021   Lab Results  Component Value Date   ALT 12 02/26/2021   AST 20 02/26/2021   ALKPHOS 66 02/26/2021   BILITOT 0.7 02/26/2021   Lab Results  Component Value Date   HGBA1C 5.2 02/25/2021   HGBA1C 5.6 01/31/2017   Lab Results  Component Value Date   INSULIN 17.2 04/09/2021   Lab Results  Component Value Date   TSH 1.60 04/03/2021   Lab Results  Component Value Date   CHOL 110 02/25/2021   HDL 44 02/25/2021   LDLCALC 59 02/25/2021   LDLDIRECT 321.8 02/27/2012   TRIG 36 02/25/2021  CHOLHDL 2.5 02/25/2021   Lab Results  Component Value Date   VD25OH 17.2 (L) 04/09/2021   Lab Results  Component Value Date   WBC 3.4 (L) 02/26/2021   HGB 12.9 02/26/2021   HCT 38.0 02/26/2021   MCV 100.8 (H) 02/26/2021   PLT 160 02/26/2021   Lab Results  Component Value Date   IRON 386 06/23/2017   TIBC 285 06/23/2017   FERRITIN 58 06/23/2017    Obesity Behavioral Intervention:   Approximately 15 minutes were spent on the discussion below.  ASK: We discussed the diagnosis of obesity with Levada Dy today and Rakiyah agreed to give [name] permission to discuss obesity behavioral modification therapy today.  ASSESS: Yahaira has the diagnosis of obesity and her BMI today is 41.9. Ileah is in the action stage of change.   ADVISE: Nikea was educated on the multiple health risks of obesity as well as the benefit of weight loss to improve her health. She was advised of the need for long term treatment and the importance of lifestyle modifications to improve her current health and to decrease her risk of future health problems.  AGREE: Multiple dietary modification options and  treatment options were discussed and Lilie agreed to follow the recommendations documented in the above note.  ARRANGE: Rita was educated on the importance of frequent visits to treat obesity as outlined per CMS and USPSTF guidelines and agreed to schedule her next follow up appointment today.  Attestation Statements:   Reviewed by clinician on day of visit: allergies, medications, problem list, medical history, surgical history, family history, social history, and previous encounter notes.  Coral Ceo, CMA, am acting as transcriptionist for [employer], DO.  I have reviewed the above documentation for accuracy and completeness, and I agree with the above. Marjory Sneddon, D.O.  The [place] was signed into law in 2016 which includes the topic of electronic health records.  This provides immediate access to information in MyChart.  This includes consultation notes, operative notes, office notes, lab results and pathology reports.  If you have any questions about what you read please let [name] know at your next visit so we can discuss your concerns and take corrective action if need be.  We are right here with you.

## 2021-04-25 ENCOUNTER — Other Ambulatory Visit: Payer: Self-pay

## 2021-04-25 ENCOUNTER — Ambulatory Visit: Payer: BC Managed Care – PPO | Attending: Internal Medicine

## 2021-04-25 DIAGNOSIS — M546 Pain in thoracic spine: Secondary | ICD-10-CM | POA: Diagnosis not present

## 2021-04-25 DIAGNOSIS — M545 Low back pain, unspecified: Secondary | ICD-10-CM | POA: Insufficient documentation

## 2021-04-25 DIAGNOSIS — M6281 Muscle weakness (generalized): Secondary | ICD-10-CM | POA: Insufficient documentation

## 2021-04-25 DIAGNOSIS — R252 Cramp and spasm: Secondary | ICD-10-CM | POA: Diagnosis not present

## 2021-04-25 DIAGNOSIS — G8929 Other chronic pain: Secondary | ICD-10-CM | POA: Insufficient documentation

## 2021-05-02 ENCOUNTER — Ambulatory Visit: Payer: BC Managed Care – PPO

## 2021-05-02 ENCOUNTER — Other Ambulatory Visit: Payer: Self-pay

## 2021-05-02 DIAGNOSIS — G8929 Other chronic pain: Secondary | ICD-10-CM

## 2021-05-02 DIAGNOSIS — M6281 Muscle weakness (generalized): Secondary | ICD-10-CM

## 2021-05-02 DIAGNOSIS — M546 Pain in thoracic spine: Secondary | ICD-10-CM

## 2021-05-02 DIAGNOSIS — M545 Low back pain, unspecified: Secondary | ICD-10-CM | POA: Diagnosis not present

## 2021-05-02 DIAGNOSIS — R252 Cramp and spasm: Secondary | ICD-10-CM | POA: Diagnosis not present

## 2021-05-02 NOTE — Therapy (Signed)
OUTPATIENT PHYSICAL THERAPY TREATMENT NOTE   Patient Name: [name] MRN: [id number] DOB:[birth year], 66 y.o., female Today's Date: 05/02/2021  PCP: Janith Lima, MD REFERRING PROVIDER: Florencia Reasons, MD   PT End of Session - 05/02/21 1822     Visit Number 5    Number of Visits 13    Date for PT Re-Evaluation 05/18/21    Authorization Type BCBS    PT Start Time 1828    PT Stop Time 1911    PT Time Calculation (min) 43 min    Activity Tolerance Patient tolerated treatment well    Behavior During Therapy [facility] for tasks assessed/performed                Past Medical History:  Diagnosis Date   Back pain    Bilateral swelling of feet and ankles    CAD (coronary artery disease)    a. remote stents/angioplasty. b. s/p CABG 01/2017 with mitral valve annuloplasty.   Chest pain    Constipation    Diastolic heart failure (HCC)    Esophageal reflux    Gallbladder problem    GERD (gastroesophageal reflux disease)    History of MI (myocardial infarction)    Hyperlipidemia    Hypertension    Hypothyroidism    Immune thrombocytopenic purpura ([place])    Joint pain    Kidney problem    Lupus erythematosus    Mitral valve insufficiency and aortic valve insufficiency    Myocardial infarction (HCC)    x 2   Other fatigue    PONV (postoperative nausea and vomiting)    Postoperative atrial fibrillation (HCC)    Renal disease    Severe mitral regurgitation    a. s/p MV annuloplasty 01/2017 at time of CABG.   Shortness of breath on exertion    Sleep apnea    Status post mitral valve annuloplasty    Unspecified disease of pericardium    Past Surgical History:  Procedure Laterality Date   CHOLECYSTECTOMY, LAPAROSCOPIC  2010   CORONARY ARTERY BYPASS GRAFT N/A 01/30/2017   Procedure: CORONARY ARTERY BYPASS GRAFTING (CABG), Times four  using the right saphaneous vein, harvested endoscopicly.  and left internal mammary artery .;  Surgeon: Gaye Pollack, MD;  Location: [facility] OR;   Service: Open Heart Surgery;  Laterality: N/A;   LEFT HEART CATH AND CORONARY ANGIOGRAPHY N/A 01/23/2017   Procedure: LEFT HEART CATH AND CORONARY ANGIOGRAPHY;  Surgeon: Belva Crome, MD;  Location: [place] [facility];  Service: Cardiovascular;  Laterality: N/A;   MITRAL VALVE REPAIR N/A 01/30/2017   Procedure: MITRAL VALVE REPAIR (MVR);  Surgeon: Gaye Pollack, MD;  Location: [place];  Service: Open Heart Surgery;  Laterality: N/A;   plastic surgical repair      dog bite to leg   RIGHT/LEFT HEART CATH AND CORONARY/GRAFT ANGIOGRAPHY N/A 02/26/2021   Procedure: RIGHT/LEFT HEART CATH AND CORONARY/GRAFT ANGIOGRAPHY;  Surgeon: Belva Crome, MD;  Location: [place] [facility] LAB;  Service: Cardiovascular;  Laterality: N/A;   SPLENECTOMY  5-04   TEE WITHOUT CARDIOVERSION N/A 01/27/2017   Procedure: TRANSESOPHAGEAL ECHOCARDIOGRAM (TEE);  Surgeon: Sanda Klein, MD;  Location: [facility] ENDOSCOPY;  Service: Cardiovascular;  Laterality: N/A;   TEE WITHOUT CARDIOVERSION N/A 01/30/2017   Procedure: TRANSESOPHAGEAL ECHOCARDIOGRAM (TEE);  Surgeon: Gaye Pollack, MD;  Location: [place];  Service: Open Heart Surgery;  Laterality: N/A;   ULTRASOUND GUIDANCE FOR VASCULAR ACCESS  01/23/2017   Procedure: Ultrasound Guidance For Vascular Access;  Surgeon: Belva Crome, MD;  Location: [place] [facility];  Service: Cardiovascular;;   Patient Active Problem List   Diagnosis Date Noted   Need for shingles vaccine 04/08/2021   Encounter for general adult medical examination with abnormal findings 04/08/2021   Chronic heart failure with preserved ejection fraction ([place]) 04/03/2021   Hyperlipidemia LDL goal <70 04/03/2021   Screen for colon cancer 04/03/2021   Systemic lupus erythematosus ([place]) 02/25/2021   Stage 3a chronic kidney disease ([place])    Hypertension 08/08/2020   Chronic idiopathic constipation 05/31/2020   Morbid obesity ([place]) 05/31/2020   Cervical cancer screening 05/27/2017   Visit for screening mammogram  05/27/2017   Routine general medical examination at a health care facility 02/18/2017   Postoperative atrial fibrillation ([place]) 02/18/2017   Osteoarthritis of spine with radiculopathy, lumbar region 06/24/2016   Hypothyroidism 04/08/2011   Coronary artery disease involving coronary bypass graft of native heart with angina pectoris ([place]) 02/26/2009   GERD without esophagitis 08/05/2007   Systemic lupus erythematosus with focal and segmental proliferative glomerulonephritis ([place]) 08/05/2007   REFERRING DIAG: M54.9 (ICD-10-CM) - Upper back pain on right side   THERAPY DIAG:  Pain in thoracic spine   Chronic bilateral low back pain without sciatica   Cramp and spasm   Muscle weakness (generalized)   ONSET DATE: chronic; >2 years    SUBJECTIVE:                                                                                                                                                                                            SUBJECTIVE STATEMENT: Patient reports dull pain about Rt mid back. She reports compliance with HEP.   PERTINENT HISTORY:  Recent hospitalization 12/5-12/7/22 for acute CHF Systemic Lupus Erythematosus CAD s/p MI 2003 S/p CABG 2018 Atrial fibrillation See PMH above  PAIN:  Are you having pain? Yes VAS scale: 3/10 Pain location: Rt mid back  Pain orientation: Posterior  PAIN TYPE: dull ache  Pain description: intermittent  Aggravating factors: sleeping on the Rt side Relieving factors: stretching    PRECAUTIONS: None   WEIGHT BEARING RESTRICTIONS No   OBJECTIVE:   *Unless otherwise noted all objective measures were captured on initial evaluation.   DIAGNOSTIC FINDINGS:  Reports 3 spinal MRI in the past year with patient reporting findings of arthritis. Imaging is not on file.    PATIENT SURVEYS:  FOTO 40% function to 51% predicted   SCREENING FOR RED FLAGS: Bowel or bladder incontinence: No Cauda equina syndrome: No   COGNITION:           Overall cognitive status: Within functional  limits for tasks assessed                        SENSATION:          Light touch: Appears intact               POSTURE:  Forward head, rounded shoulders    PALPATION: TTP Rt thoracic paraspinals with spasm occurring with palpation.    LUMBARAROM/PROM   A/PROM A/PROM  04/04/2021  Flexion WNL minor LBP  Extension WNL; pulling along back  Right lateral flexion WNL; pulling along back  Left lateral flexion WNL  Right rotation WNL  Left rotation WNL; pain along Rt upper thoracic    (Blank rows = not tested)   LE MMT:   MMT Right 04/04/2021 Left 04/04/2021  Hip flexion 4+ 5  Hip extension      Hip abduction 3+ 4-  Hip adduction      Hip internal rotation      Hip external rotation      Knee flexion      Knee extension      Ankle dorsiflexion      Ankle plantarflexion      Ankle inversion      Ankle eversion       (Blank rows = not tested)     UPPER EXTREMITY AROM/PROM: Shoulder AROM WNL bilaterally with patient reporting discomfort in the midback with functional ER on the RUE.    (Blank rows = not tested)   UPPER EXTREMITY MMT: Rt mid thoracic pain with all MMT on the RUE   MMT Right 04/04/2021 Left 04/04/2021 04/18/21 04/25/21 05/02/21  Shoulder flexion 4 5 Rt: 4+/5 Lt 5/5     Shoulder extension         Shoulder abduction 4 5   5/5 bilaterally pain free   Shoulder adduction         Middle trapezius 4- 4-  Lt: 4+/5 Rt: 4/5    Lower trapezius         Shoulder ER 4 5     Shoulder IR 4 5     Wrist flexion         Wrist extension         Wrist ulnar deviation         Wrist radial deviation         Wrist pronation         Wrist supination         Grip strength (lbs)         (Blank rows = not tested)     JOINT MOBILITY TESTING:  Pain before restriction thoracic spine PAIVM     LUMBAR SPECIAL TESTS:  (-) SLR        TODAY'S TREATMENT  [facility] Adult PT Treatment:                                                DATE:  05/02/21 Therapeutic Exercise: UBE level 2.5; 2 min each fwd/bwd  Prone row 2 x10 bilateral; 3 lbs  Bilateral shoulder ER red band 2 x 10  Serratus wall slides 2 x 10  Resisted shoulder adduction 2 x 10 bilateral red band  Resisted row 2 x 10 @ 25 lbs Manual Therapy: STM/DTM/ Trigger point release Rt thoracic paraspinals, rhomboids, trapezius, latissimus dorsi,  infraspinatus, teres major/minor     [facility] Adult PT Treatment:                                                DATE: 04/25/21 Therapeutic Exercise: UBE level 2; 2 min each fwd/bwd  Seated thoracic extension 1 x 10  Rhomboid doorway stretch 30 sec Stability ball wall walk ups 1 x 10  Prone T 2 x 15 Rows green band 2 x 15  Resisted horizontal abduction green band 2 x 10  Updated HEP  Manual Therapy: STM/DTM Rt thoracolumbar paraspinals, rhomboids, subscapularis Mid T-spine CPAs grade II-III  Trigger Point Dry Needling Treatment: Pre-treatment instruction: Patient instructed on dry needling rationale, procedures, and possible side effects including pain during treatment (achy,cramping feeling), bruising, drop of blood, lightheadedness, nausea, sweating. Patient Consent Given: Yes Education handout provided: Previously provided Muscles treated: Rt rhomboid shelf technique; Rt subscapularis   Treatment response/outcome: Twitch response elicited and Palpable decrease in muscle tension Post-treatment instructions: Patient instructed to expect possible mild to moderate muscle soreness later today and/or tomorrow. Patient instructed in methods to reduce muscle soreness and to continue prescribed HEP. If patient was dry needled over the lung field, patient was instructed on signs and symptoms of pneumothorax and, however unlikely, to see immediate medical attention should they occur. Patient was also educated on signs and symptoms of infection and to seek medical attention should they occur. Patient verbalized understanding of these  instructions and education.    [facility] [facility] Adult PT Treatment:                                                DATE: 04/18/21 Therapeutic Exercise: UBE level 1.5 2 min each fwd/bwd  Resisted shoulder extension 2 x 15 green band  Resisted rows blue band 2 x 15   Manual Therapy: STM/DTM/ Trigger point release to Rt thoracolumbar paraspinals, rhomboids, trapezius  Rt scapular mobilization inferior and superior and upward/downward rotation  Reviewed tennis ball for self soft tissue mobilization recommending to hold for 20-30 seconds over active trigger points.  Trigger Point Dry Needling Treatment: Pre-treatment instruction: Patient instructed on dry needling rationale, procedures, and possible side effects including pain during treatment (achy,cramping feeling), bruising, drop of blood, lightheadedness, nausea, sweating. Patient Consent Given: Yes Education handout provided: Yes Muscles treated: Rt rhomboid major/minor shelf technique   Treatment response/outcome: Palpable decrease in muscle tension Post-treatment instructions: Patient instructed to expect possible mild to moderate muscle soreness later today and/or tomorrow. Patient instructed in methods to reduce muscle soreness and to continue prescribed HEP. If patient was dry needled over the lung field, patient was instructed on signs and symptoms of pneumothorax and, however unlikely, to see immediate medical attention should they occur. Patient was also educated on signs and symptoms of infection and to seek medical attention should they occur. Patient verbalized understanding of these instructions and education.   [facility] Adult PT Treatment:                                                DATE: 04/11/21 Therapeutic Exercise: Sidelying thoracic rotation  1 x 10 each  Supine pelvic tilts 2 x 10  LTR 60 sec Stability ball rollout 2 minutes 3 way  Prone scapular retraction 2 x 10  Issued HEP  Manual Therapy: Mid/lower T-spine CPAs grade  II STM/DTM/ Trigger point release to Rt thoracolumbar paraspinals, rhomboids, trapezius  Issued tennis ball for self soft tissue mobilization.         PATIENT EDUCATION:  Education details: N/A Person educated: N/A Education method: N/A Education comprehension: N/A     HOME EXERCISE PROGRAM: Access Code [id number]   ASSESSMENT:   CLINICAL IMPRESSION:  Minimal restriction noted in Rt rhomboids and thoracic paraspinals today. Trigger points presents in Rt infraspinatus and teres minor with partial release from manual therapy. Patient declined TPDN tonight, but will potentially benefit from this intervention at future sessions if trigger points remain. Continued with periscapular strengthening with patient requiring minimal postural cues to decrease excessive upper trap engagement. Rt shoulder abduction strength has much improved compared to baseline without onset of thoracic pain.    REHAB POTENTIAL: Fair     CLINICAL DECISION MAKING: Stable/uncomplicated   EVALUATION COMPLEXITY: Low     GOALS: Goals reviewed with patient? No   SHORT TERM GOALS:   STG Name Target Date Goal status  1 Patient will be independent and compliant with initial HEP.    Baseline: no time at eval 04/18/2021 Achieved   2 Patient will report pain <5/10 indicative of improvements in overall condition.  Baseline:  04/25/2021 Achieved      LONG TERM GOALS:    LTG Name Target Date Goal status  1 Patient will demonstrate 4+/5 Rt shoulder strength without reports of back pain to improve ability to complete lifting and carrying activity.  Baseline: 05/16/2021 INITIAL  2 Patient will demonstrate 4+/5 bilateral middle trap strength to improve scapular stability.  Baseline: 05/16/2021 Partially met   3 Patient will score at least 51% on FOTO to signify clinically meaningful improvement in functional abilities.    Baseline: 40 05/16/2021 INITIAL  4 Patient will demonstrate at least 4+/5 bilateral hip abductor  strength to improve stability about the chain with prolonged walking.  Baseline: 05/16/2021 INITIAL    PLAN: PT FREQUENCY: 2x/week   PT DURATION: 6 weeks   PLANNED INTERVENTIONS: Therapeutic exercises, Therapeutic activity, Neuro Muscular re-education, Balance training, Patient/Family education, Joint mobilization, Dry Needling, Cryotherapy, Moist heat, and Manual therapy   PLAN FOR NEXT SESSION: progress spinal mobility, hip and periscapular strengthening,    Gwendolyn Grant, PT, DPT, ATC 05/02/21 7:12 PM

## 2021-05-03 ENCOUNTER — Other Ambulatory Visit: Payer: Self-pay | Admitting: Internal Medicine

## 2021-05-03 DIAGNOSIS — R600 Localized edema: Secondary | ICD-10-CM

## 2021-05-03 DIAGNOSIS — I10 Essential (primary) hypertension: Secondary | ICD-10-CM

## 2021-05-05 LAB — COLOGUARD

## 2021-05-07 ENCOUNTER — Other Ambulatory Visit: Payer: Self-pay

## 2021-05-07 ENCOUNTER — Encounter (INDEPENDENT_AMBULATORY_CARE_PROVIDER_SITE_OTHER): Payer: Self-pay | Admitting: Family Medicine

## 2021-05-07 ENCOUNTER — Ambulatory Visit (INDEPENDENT_AMBULATORY_CARE_PROVIDER_SITE_OTHER): Payer: Medicare Other | Admitting: Family Medicine

## 2021-05-07 VITALS — BP 102/68 | HR 69 | Temp 98.0°F | Ht 66.0 in | Wt 256.0 lb

## 2021-05-07 DIAGNOSIS — Z6841 Body Mass Index (BMI) 40.0 and over, adult: Secondary | ICD-10-CM | POA: Diagnosis not present

## 2021-05-07 DIAGNOSIS — E669 Obesity, unspecified: Secondary | ICD-10-CM

## 2021-05-07 DIAGNOSIS — I5032 Chronic diastolic (congestive) heart failure: Secondary | ICD-10-CM | POA: Diagnosis not present

## 2021-05-08 ENCOUNTER — Encounter (INDEPENDENT_AMBULATORY_CARE_PROVIDER_SITE_OTHER): Payer: Self-pay | Admitting: Family Medicine

## 2021-05-08 NOTE — Progress Notes (Signed)
Chief Complaint:   OBESITY [name] is here to discuss her progress with her obesity treatment plan along with follow-up of her obesity related diagnoses. [name] is on the Category 1 Plan with breakfast options and states she is following her eating plan approximately 100% of the time. [name] states she is doing 0 minutes 0 times per week.  Today's visit was #: 3 Starting weight: 258 lbs Starting date: 04/09/2021 Today's weight: 256 lbs Today's date: 05/07/2021 Total lbs lost to date: 2 lbs Total lbs lost since last in-office visit: 3 lbs  Interim History: [name] is doing very well on plan. She is weighing her meal and eating all of the prescribed protein. Her hunger is mostly satisfied. She is starting to cook more. She packs a lunch (sandwich) daily to take to work.  Subjective:   1. Chronic heart failure with preserved ejection fraction (HCC) [name]'s ejection fraction is 50-55%. She is on Farxiga, lasix, Metoprolol, Crestor,and potassium. She is unsure if she has fluid restriction but she does not try to push fluids. She has a history of Coronary artery bypass graft and Mitral valve regurgitation.   Assessment/Plan:   1. Chronic heart failure with preserved ejection fraction (HCC) [name] will follow up with cardiology. She will ask cardiologist about fluid intake amount not to exceed daily.  2. Obesity with current BMI of 41.34 [name] is currently in the action stage of change. As such, her goal is to continue with weight loss efforts. She has agreed to the Category 1 Plan.   Exercise goals: No exercise has been prescribed at this time.  Behavioral modification strategies: decreasing sodium intake, meal planning and cooking strategies, and planning for success.  [name] has agreed to follow-up with our clinic in 2 weeks with Dr. Raliegh Scarlet.  Objective:   Blood pressure 102/68, pulse 69, temperature 98 F (36.7 C), height 5\' 6"  (1.676 m), weight 256 lb (116.1 kg), SpO2 97  %. Body mass index is 41.32 kg/m.  General: Cooperative, alert, well developed, in no acute distress. HEENT: Conjunctivae and lids unremarkable. Cardiovascular: Regular rhythm.  Lungs: Normal work of breathing. Neurologic: No focal deficits.   Lab Results  Component Value Date   CREATININE 1.41 (H) 04/03/2021   BUN 26 (H) 04/03/2021   NA 138 04/03/2021   K 4.1 04/03/2021   CL 103 04/03/2021   CO2 29 04/03/2021   Lab Results  Component Value Date   ALT 12 02/26/2021   AST 20 02/26/2021   ALKPHOS 66 02/26/2021   BILITOT 0.7 02/26/2021   Lab Results  Component Value Date   HGBA1C 5.2 02/25/2021   HGBA1C 5.6 01/31/2017   Lab Results  Component Value Date   INSULIN 17.2 04/09/2021   Lab Results  Component Value Date   TSH 1.60 04/03/2021   Lab Results  Component Value Date   CHOL 110 02/25/2021   HDL 44 02/25/2021   LDLCALC 59 02/25/2021   LDLDIRECT 321.8 02/27/2012   TRIG 36 02/25/2021   CHOLHDL 2.5 02/25/2021   Lab Results  Component Value Date   VD25OH 17.2 (L) 04/09/2021   Lab Results  Component Value Date   WBC 3.4 (L) 02/26/2021   HGB 12.9 02/26/2021   HCT 38.0 02/26/2021   MCV 100.8 (H) 02/26/2021   PLT 160 02/26/2021   Lab Results  Component Value Date   IRON 386 06/23/2017   TIBC 285 06/23/2017   FERRITIN 58 06/23/2017   Attestation Statements:   Reviewed by clinician  on day of visit: allergies, medications, problem list, medical history, surgical history, family history, social history, and previous encounter notes.  I, Lizbeth Bark, RMA, am acting as Location manager for [employer], [place].  I have reviewed the above documentation for accuracy and completeness, and I agree with the above. -  Georgianne Fick, FNP

## 2021-05-08 NOTE — Therapy (Incomplete)
OUTPATIENT PHYSICAL THERAPY TREATMENT NOTE   Patient Name: [name] MRN: [id number] DOB:1955/05/23, 66 y.o., female Today's Date: 05/08/2021  PCP: Janith Lima, MD REFERRING PROVIDER: Florencia Reasons, MD        Past Medical History:  Diagnosis Date   Back pain    Bilateral swelling of feet and ankles    CAD (coronary artery disease)    a. remote stents/angioplasty. b. s/p CABG 01/2017 with mitral valve annuloplasty.   Chest pain    Constipation    Diastolic heart failure (HCC)    Esophageal reflux    Gallbladder problem    GERD (gastroesophageal reflux disease)    History of MI (myocardial infarction)    Hyperlipidemia    Hypertension    Hypothyroidism    Immune thrombocytopenic purpura ([place])    Joint pain    Kidney problem    Lupus erythematosus    Mitral valve insufficiency and aortic valve insufficiency    Myocardial infarction (HCC)    x 2   Other fatigue    PONV (postoperative nausea and vomiting)    Postoperative atrial fibrillation (HCC)    Renal disease    Severe mitral regurgitation    a. s/p MV annuloplasty 01/2017 at time of CABG.   Shortness of breath on exertion    Sleep apnea    Status post mitral valve annuloplasty    Unspecified disease of pericardium    Past Surgical History:  Procedure Laterality Date   CHOLECYSTECTOMY, LAPAROSCOPIC  2010   CORONARY ARTERY BYPASS GRAFT N/A 01/30/2017   Procedure: CORONARY ARTERY BYPASS GRAFTING (CABG), Times four  using the right saphaneous vein, harvested endoscopicly.  and left internal mammary artery .;  Surgeon: Gaye Pollack, MD;  [facility];  Service: Open Heart Surgery;  Laterality: N/A;   LEFT HEART CATH AND CORONARY ANGIOGRAPHY N/A 01/23/2017   Procedure: LEFT HEART CATH AND CORONARY ANGIOGRAPHY;  Surgeon: Belva Crome, MD;  Location: [place] [facility];  Service: Cardiovascular;  Laterality: N/A;   MITRAL VALVE REPAIR N/A 01/30/2017   Procedure: MITRAL VALVE REPAIR (MVR);  Surgeon:  Gaye Pollack, MD;  Location: [place];  Service: Open Heart Surgery;  Laterality: N/A;   plastic surgical repair      dog bite to leg   RIGHT/LEFT HEART CATH AND CORONARY/GRAFT ANGIOGRAPHY N/A 02/26/2021   Procedure: RIGHT/LEFT HEART CATH AND CORONARY/GRAFT ANGIOGRAPHY;  Surgeon: Belva Crome, MD;  Location: [place] [facility];  Service: Cardiovascular;  Laterality: N/A;   SPLENECTOMY  5-04   TEE WITHOUT CARDIOVERSION N/A 01/27/2017   Procedure: TRANSESOPHAGEAL ECHOCARDIOGRAM (TEE);  Surgeon: Sanda Klein, MD;  Location: [facility];  Service: Cardiovascular;  Laterality: N/A;   TEE WITHOUT CARDIOVERSION N/A 01/30/2017   Procedure: TRANSESOPHAGEAL ECHOCARDIOGRAM (TEE);  Surgeon: Gaye Pollack, MD;  Location: [place];  Service: Open Heart Surgery;  Laterality: N/A;   ULTRASOUND GUIDANCE FOR VASCULAR ACCESS  01/23/2017   Procedure: Ultrasound Guidance For Vascular Access;  Surgeon: Belva Crome, MD;  Location: [place] [facility];  Service: Cardiovascular;;   Patient Active Problem List   Diagnosis Date Noted   Need for shingles vaccine 04/08/2021   Encounter for general adult medical examination with abnormal findings 04/08/2021   Chronic heart failure with preserved ejection fraction ([place]) 04/03/2021   Hyperlipidemia LDL goal <70 04/03/2021   Screen for colon cancer 04/03/2021   Systemic lupus erythematosus ([place]) 02/25/2021   Stage 3a chronic kidney disease ([place])  Hypertension 08/08/2020   Chronic idiopathic constipation 05/31/2020   Morbid obesity ([place]) 05/31/2020   Cervical cancer screening 05/27/2017   Visit for screening mammogram 05/27/2017   Routine general medical examination at a health care facility 02/18/2017   Postoperative atrial fibrillation ([place]) 02/18/2017   Osteoarthritis of spine with radiculopathy, lumbar region 06/24/2016   Hypothyroidism 04/08/2011   Coronary artery disease involving coronary bypass graft of native heart with angina pectoris ([place]) 02/26/2009    GERD without esophagitis 08/05/2007   Systemic lupus erythematosus with focal and segmental proliferative glomerulonephritis ([place]) 08/05/2007   REFERRING DIAG: M54.9 (ICD-10-CM) - Upper back pain on right side   THERAPY DIAG:  Pain in thoracic spine   Chronic bilateral low back pain without sciatica   Cramp and spasm   Muscle weakness (generalized)   ONSET DATE: chronic; >2 years    SUBJECTIVE:                                                                                                                                                                                            SUBJECTIVE STATEMENT:   PERTINENT HISTORY:  Recent hospitalization 12/5-12/7/22 for acute CHF Systemic Lupus Erythematosus CAD s/p MI 2003 S/p CABG 2018 Atrial fibrillation See PMH above  PAIN:  Are you having pain? Yes VAS scale: 3/10 Pain location: Rt mid back  Pain orientation: Posterior  PAIN TYPE: dull ache  Pain description: intermittent  Aggravating factors: sleeping on the Rt side Relieving factors: stretching    PRECAUTIONS: None   WEIGHT BEARING RESTRICTIONS No   OBJECTIVE:   *Unless otherwise noted all objective measures were captured on initial evaluation.   DIAGNOSTIC FINDINGS:  Reports 3 spinal MRI in the past year with patient reporting findings of arthritis. Imaging is not on file.    PATIENT SURVEYS:  FOTO 40% function to 51% predicted   SCREENING FOR RED FLAGS: Bowel or bladder incontinence: No Cauda equina syndrome: No   COGNITION:          Overall cognitive status: Within functional limits for tasks assessed                        SENSATION:          Light touch: Appears intact               POSTURE:  Forward head, rounded shoulders    PALPATION: TTP Rt thoracic paraspinals with spasm occurring with palpation.    LUMBARAROM/PROM   A/PROM A/PROM  04/04/2021  Flexion WNL minor LBP  Extension WNL; pulling along back  Right lateral flexion WNL; pulling  along back  Left lateral flexion WNL  Right rotation WNL  Left rotation WNL; pain along Rt upper thoracic    (Blank rows = not tested)   LE MMT:   MMT Right 04/04/2021 Left 04/04/2021  Hip flexion 4+ 5  Hip extension      Hip abduction 3+ 4-  Hip adduction      Hip internal rotation      Hip external rotation      Knee flexion      Knee extension      Ankle dorsiflexion      Ankle plantarflexion      Ankle inversion      Ankle eversion       (Blank rows = not tested)     UPPER EXTREMITY AROM/PROM: Shoulder AROM WNL bilaterally with patient reporting discomfort in the midback with functional ER on the RUE.    (Blank rows = not tested)   UPPER EXTREMITY MMT: Rt mid thoracic pain with all MMT on the RUE   MMT Right 04/04/2021 Left 04/04/2021 04/18/21 04/25/21 05/02/21  Shoulder flexion 4 5 Rt: 4+/5 Lt 5/5     Shoulder extension         Shoulder abduction 4 5   5/5 bilaterally pain free   Shoulder adduction         Middle trapezius 4- 4-  Lt: 4+/5 Rt: 4/5    Lower trapezius         Shoulder ER 4 5     Shoulder IR 4 5     Wrist flexion         Wrist extension         Wrist ulnar deviation         Wrist radial deviation         Wrist pronation         Wrist supination         Grip strength (lbs)         (Blank rows = not tested)     JOINT MOBILITY TESTING:  Pain before restriction thoracic spine PAIVM     LUMBAR SPECIAL TESTS:  (-) SLR        TODAY'S TREATMENT  [facility] Adult PT Treatment:                                                DATE: 05/02/21 Therapeutic Exercise: UBE level 2.5; 2 min each fwd/bwd  Prone row 2 x10 bilateral; 3 lbs  Bilateral shoulder ER red band 2 x 10  Serratus wall slides 2 x 10  Resisted shoulder adduction 2 x 10 bilateral red band  Resisted row 2 x 10 @ 25 lbs Manual Therapy: STM/DTM/ Trigger point release Rt thoracic paraspinals, rhomboids, trapezius, latissimus dorsi, infraspinatus, teres major/minor     [facility] Adult PT Treatment:                                                 DATE: 04/25/21 Therapeutic Exercise: UBE level 2; 2 min each fwd/bwd  Seated thoracic extension 1 x 10  Rhomboid doorway stretch 30 sec Stability ball wall walk ups 1 x 10  Prone T 2 x 15 Rows green band 2 x  15  Resisted horizontal abduction green band 2 x 10  Updated HEP  Manual Therapy: STM/DTM Rt thoracolumbar paraspinals, rhomboids, subscapularis Mid T-spine CPAs grade II-III  Trigger Point Dry Needling Treatment: Pre-treatment instruction: Patient instructed on dry needling rationale, procedures, and possible side effects including pain during treatment (achy,cramping feeling), bruising, drop of blood, lightheadedness, nausea, sweating. Patient Consent Given: Yes Education handout provided: Previously provided Muscles treated: Rt rhomboid shelf technique; Rt subscapularis   Treatment response/outcome: Twitch response elicited and Palpable decrease in muscle tension Post-treatment instructions: Patient instructed to expect possible mild to moderate muscle soreness later today and/or tomorrow. Patient instructed in methods to reduce muscle soreness and to continue prescribed HEP. If patient was dry needled over the lung field, patient was instructed on signs and symptoms of pneumothorax and, however unlikely, to see immediate medical attention should they occur. Patient was also educated on signs and symptoms of infection and to seek medical attention should they occur. Patient verbalized understanding of these instructions and education     PATIENT EDUCATION:  Education details: N/A Person educated: N/A Education method: N/A Education comprehension: N/A     HOME EXERCISE PROGRAM: Access Code [email or web address]   ASSESSMENT:   CLINICAL IMPRESSION:     REHAB POTENTIAL: Fair     CLINICAL DECISION MAKING: Stable/uncomplicated   EVALUATION COMPLEXITY: Low     GOALS: Goals reviewed with patient? No   SHORT TERM GOALS:   STG Name Target  Date Goal status  1 Patient will be independent and compliant with initial HEP.    Baseline: no time at eval 04/18/2021 Achieved   2 Patient will report pain <5/10 indicative of improvements in overall condition.  Baseline:  04/25/2021 Achieved      LONG TERM GOALS:    LTG Name Target Date Goal status  1 Patient will demonstrate 4+/5 Rt shoulder strength without reports of back pain to improve ability to complete lifting and carrying activity.  Baseline: 05/16/2021 INITIAL  2 Patient will demonstrate 4+/5 bilateral middle trap strength to improve scapular stability.  Baseline: 05/16/2021 Partially met   3 Patient will score at least 51% on FOTO to signify clinically meaningful improvement in functional abilities.    Baseline: 40 05/16/2021 INITIAL  4 Patient will demonstrate at least 4+/5 bilateral hip abductor strength to improve stability about the chain with prolonged walking.  Baseline: 05/16/2021 INITIAL    PLAN: PT FREQUENCY: 2x/week   PT DURATION: 6 weeks   PLANNED INTERVENTIONS: Therapeutic exercises, Therapeutic activity, Neuro Muscular re-education, Balance training, Patient/Family education, Joint mobilization, Dry Needling, Cryotherapy, Moist heat, and Manual therapy   PLAN FOR NEXT SESSION: progress spinal mobility, hip and periscapular strengthening,    Gwendolyn Grant, PT, DPT, ATC 05/08/21 1:48 PM

## 2021-05-09 ENCOUNTER — Ambulatory Visit: Payer: BC Managed Care – PPO

## 2021-05-15 NOTE — Therapy (Signed)
OUTPATIENT PHYSICAL THERAPY TREATMENT NOTE   Patient Name: [name] MRN: [id number] DOB:08-Jun-1955, 66 y.o., female Today's Date: 05/16/2021  PCP: Janith Lima, MD REFERRING PROVIDER: Florencia Reasons, MD   PT End of Session - 05/16/21 1824     Visit Number 6    Number of Visits 13    Date for PT Re-Evaluation 05/18/21    Authorization Type BCBS    PT Start Time 1824    PT Stop Time 1904    PT Time Calculation (min) 40 min    Activity Tolerance Patient tolerated treatment well    Behavior During Therapy [facility] for tasks assessed/performed                 Past Medical History:  Diagnosis Date   Back pain    Bilateral swelling of feet and ankles    CAD (coronary artery disease)    a. remote stents/angioplasty. b. s/p CABG 01/2017 with mitral valve annuloplasty.   Chest pain    Constipation    Diastolic heart failure (HCC)    Esophageal reflux    Gallbladder problem    GERD (gastroesophageal reflux disease)    History of MI (myocardial infarction)    Hyperlipidemia    Hypertension    Hypothyroidism    Immune thrombocytopenic purpura ([place])    Joint pain    Kidney problem    Lupus erythematosus    Mitral valve insufficiency and aortic valve insufficiency    Myocardial infarction (HCC)    x 2   Other fatigue    PONV (postoperative nausea and vomiting)    Postoperative atrial fibrillation (HCC)    Renal disease    Severe mitral regurgitation    a. s/p MV annuloplasty 01/2017 at time of CABG.   Shortness of breath on exertion    Sleep apnea    Status post mitral valve annuloplasty    Unspecified disease of pericardium    Past Surgical History:  Procedure Laterality Date   CHOLECYSTECTOMY, LAPAROSCOPIC  2010   CORONARY ARTERY BYPASS GRAFT N/A 01/30/2017   Procedure: CORONARY ARTERY BYPASS GRAFTING (CABG), Times four  using the right saphaneous vein, harvested endoscopicly.  and left internal mammary artery .;  Surgeon: Gaye Pollack, MD;  [facility];  Service: Open Heart Surgery;  Laterality: N/A;   LEFT HEART CATH AND CORONARY ANGIOGRAPHY N/A 01/23/2017   Procedure: LEFT HEART CATH AND CORONARY ANGIOGRAPHY;  Surgeon: Belva Crome, MD;  Location: [place] [facility];  Service: Cardiovascular;  Laterality: N/A;   MITRAL VALVE REPAIR N/A 01/30/2017   Procedure: MITRAL VALVE REPAIR (MVR);  Surgeon: Gaye Pollack, MD;  Location: [place];  Service: Open Heart Surgery;  Laterality: N/A;   plastic surgical repair      dog bite to leg   RIGHT/LEFT HEART CATH AND CORONARY/GRAFT ANGIOGRAPHY N/A 02/26/2021   Procedure: RIGHT/LEFT HEART CATH AND CORONARY/GRAFT ANGIOGRAPHY;  Surgeon: Belva Crome, MD;  Location: [place] [facility];  Service: Cardiovascular;  Laterality: N/A;   SPLENECTOMY  5-04   TEE WITHOUT CARDIOVERSION N/A 01/27/2017   Procedure: TRANSESOPHAGEAL ECHOCARDIOGRAM (TEE);  Surgeon: Sanda Klein, MD;  Location: [facility] ENDOSCOPY;  Service: Cardiovascular;  Laterality: N/A;   TEE WITHOUT CARDIOVERSION N/A 01/30/2017   Procedure: TRANSESOPHAGEAL ECHOCARDIOGRAM (TEE);  Surgeon: Gaye Pollack, MD;  Location: [place];  Service: Open Heart Surgery;  Laterality: N/A;   ULTRASOUND GUIDANCE FOR VASCULAR ACCESS  01/23/2017   Procedure: Ultrasound Guidance For Vascular Access;  Surgeon: Belva Crome, MD;  Location: [place] [facility];  Service: Cardiovascular;;   Patient Active Problem List   Diagnosis Date Noted   Need for shingles vaccine 04/08/2021   Encounter for general adult medical examination with abnormal findings 04/08/2021   Chronic heart failure with preserved ejection fraction ([place]) 04/03/2021   Hyperlipidemia LDL goal <70 04/03/2021   Screen for colon cancer 04/03/2021   Systemic lupus erythematosus ([place]) 02/25/2021   Stage 3a chronic kidney disease ([place])    Hypertension 08/08/2020   Chronic idiopathic constipation 05/31/2020   Morbid obesity ([place]) 05/31/2020   Cervical cancer screening 05/27/2017   Visit for screening  mammogram 05/27/2017   Routine general medical examination at a health care facility 02/18/2017   Postoperative atrial fibrillation ([place]) 02/18/2017   Osteoarthritis of spine with radiculopathy, lumbar region 06/24/2016   Hypothyroidism 04/08/2011   Coronary artery disease involving coronary bypass graft of native heart with angina pectoris ([place]) 02/26/2009   GERD without esophagitis 08/05/2007   Systemic lupus erythematosus with focal and segmental proliferative glomerulonephritis ([place]) 08/05/2007   REFERRING DIAG: M54.9 (ICD-10-CM) - Upper back pain on right side   THERAPY DIAG:  Pain in thoracic spine   Chronic bilateral low back pain without sciatica   Cramp and spasm   Muscle weakness (generalized)   ONSET DATE: chronic; >2 years    SUBJECTIVE:                                                                                                                                                                                            SUBJECTIVE STATEMENT: Patient reports she is feeling "ok." Patient reports she hasn't felt pain in her mid back all week.   PERTINENT HISTORY:  Recent hospitalization 12/5-12/7/22 for acute CHF Systemic Lupus Erythematosus CAD s/p MI 2003 S/p CABG 2018 Atrial fibrillation See PMH above  PAIN:  Are you having pain? Yes VAS scale: 2/10 Pain location: Rt mid back  Pain orientation: Posterior  PAIN TYPE: dull ache  Pain description: intermittent  Aggravating factors: sleeping on the Rt side Relieving factors: stretching    PRECAUTIONS: None   WEIGHT BEARING RESTRICTIONS No   OBJECTIVE:   *Unless otherwise noted all objective measures were captured on initial evaluation.   DIAGNOSTIC FINDINGS:  Reports 3 spinal MRI in the past year with patient reporting findings of arthritis. Imaging is not on file.    PATIENT SURVEYS:  FOTO 40% function to 51% predicted 05/16/21: 46% function            POSTURE:  Forward head, rounded shoulders     PALPATION: TTP Rt thoracic  paraspinals with spasm occurring with palpation.    LUMBARAROM/PROM   A/PROM A/PROM  04/04/2021  Flexion WNL minor LBP  Extension WNL; pulling along back  Right lateral flexion WNL; pulling along back  Left lateral flexion WNL  Right rotation WNL  Left rotation WNL; pain along Rt upper thoracic    (Blank rows = not tested)   LE MMT:   MMT Right 04/04/2021 Left 04/04/2021  Hip flexion 4+ 5  Hip extension      Hip abduction 3+ 4-  Hip adduction      Hip internal rotation      Hip external rotation      Knee flexion      Knee extension      Ankle dorsiflexion      Ankle plantarflexion      Ankle inversion      Ankle eversion       (Blank rows = not tested)     UPPER EXTREMITY AROM/PROM: Shoulder AROM WNL bilaterally with patient reporting discomfort in the midback with functional ER on the RUE.    (Blank rows = not tested)   UPPER EXTREMITY MMT: Rt mid thoracic pain with all MMT on the RUE   MMT Right 04/04/2021 Left 04/04/2021 04/18/21 04/25/21 05/02/21  Shoulder flexion 4 5 Rt: 4+/5 Lt 5/5     Shoulder extension         Shoulder abduction 4 5   5/5 bilaterally pain free   Shoulder adduction         Middle trapezius 4- 4-  Lt: 4+/5 Rt: 4/5    Lower trapezius         Shoulder ER 4 5     Shoulder IR 4 5     Wrist flexion         Wrist extension         Wrist ulnar deviation         Wrist radial deviation         Wrist pronation         Wrist supination         Grip strength (lbs)         (Blank rows = not tested)     JOINT MOBILITY TESTING:  Pain before restriction thoracic spine PAIVM     LUMBAR SPECIAL TESTS:  (-) SLR        TODAY'S TREATMENT  [facility] Adult PT Treatment:                                                DATE: 05/16/21 Therapeutic Exercise: UBE level 2.5; 2 min each fwd/bwd  Resisted shoulder diagonals 2 x 5 yellow band  Standing shoulder flexion 2 x 15; 2 lbs  Standing shoulder scaption 2 x 10; 2 lbs Serratus  punch 2 x 15; 2 lbs   Sidelying thoracic rotation 1 x 10 bilateral  Prone T 2 x 10; 2 lbs  Resisted rows 2x  15 @ 25 lbs Updated HEP     [facility] Adult PT Treatment:                                                DATE: 05/02/21 Therapeutic Exercise: UBE level 2.5;  2 min each fwd/bwd  Prone row 2 x10 bilateral; 3 lbs  Bilateral shoulder ER red band 2 x 10  Serratus wall slides 2 x 10  Resisted shoulder adduction 2 x 10 bilateral red band  Resisted row 2 x 10 @ 25 lbs Manual Therapy: STM/DTM/ Trigger point release Rt thoracic paraspinals, rhomboids, trapezius, latissimus dorsi, infraspinatus, teres major/minor     [facility] Adult PT Treatment:                                                DATE: 04/25/21 Therapeutic Exercise: UBE level 2; 2 min each fwd/bwd  Seated thoracic extension 1 x 10  Rhomboid doorway stretch 30 sec Stability ball wall walk ups 1 x 10  Prone T 2 x 15 Rows green band 2 x 15  Resisted horizontal abduction green band 2 x 10  Updated HEP  Manual Therapy: STM/DTM Rt thoracolumbar paraspinals, rhomboids, subscapularis Mid T-spine CPAs grade II-III  Trigger Point Dry Needling Treatment: Pre-treatment instruction: Patient instructed on dry needling rationale, procedures, and possible side effects including pain during treatment (achy,cramping feeling), bruising, drop of blood, lightheadedness, nausea, sweating. Patient Consent Given: Yes Education handout provided: Previously provided Muscles treated: Rt rhomboid shelf technique; Rt subscapularis   Treatment response/outcome: Twitch response elicited and Palpable decrease in muscle tension Post-treatment instructions: Patient instructed to expect possible mild to moderate muscle soreness later today and/or tomorrow. Patient instructed in methods to reduce muscle soreness and to continue prescribed HEP. If patient was dry needled over the lung field, patient was instructed on signs and symptoms of pneumothorax and, however  unlikely, to see immediate medical attention should they occur. Patient was also educated on signs and symptoms of infection and to seek medical attention should they occur. Patient verbalized understanding of these instructions and education     PATIENT EDUCATION:  Education details: FOTO score; see treatment Person educated: patient  Education method: instruction, demo, handout, verbal cues Education comprehension: verbalized understanding, returned demo, verbal cues      HOME EXERCISE PROGRAM: Access Code [email or web address]   ASSESSMENT:   CLINICAL IMPRESSION:  Patient tolerated session well today with continued focus on periscapular strengthening. She continues to have difficulty controlling excessive upper trap engagement with periscapular strengthening requiring consistent cues to correct. Her FOTO score has improved compared to baseline, nearing predicted outcome score. No reports of increased pain throughout session.    REHAB POTENTIAL: Fair     CLINICAL DECISION MAKING: Stable/uncomplicated   EVALUATION COMPLEXITY: Low     GOALS: Goals reviewed with patient? No   SHORT TERM GOALS:   STG Name Target Date Goal status  1 Patient will be independent and compliant with initial HEP.    Baseline: no time at eval 04/18/2021 Achieved   2 Patient will report pain <5/10 indicative of improvements in overall condition.  Baseline:  04/25/2021 Achieved      LONG TERM GOALS:    LTG Name Target Date Goal status  1 Patient will demonstrate 4+/5 Rt shoulder strength without reports of back pain to improve ability to complete lifting and carrying activity.  Baseline: 05/16/2021 INITIAL  2 Patient will demonstrate 4+/5 bilateral middle trap strength to improve scapular stability.  Baseline: 05/16/2021 Partially met   3 Patient will score at least 51% on FOTO to signify clinically meaningful improvement in functional abilities.  Baseline: 40 05/16/2021 INITIAL  4 Patient will demonstrate at  least 4+/5 bilateral hip abductor strength to improve stability about the chain with prolonged walking.  Baseline: 05/16/2021 INITIAL    PLAN: PT FREQUENCY: 2x/week   PT DURATION: 6 weeks   PLANNED INTERVENTIONS: Therapeutic exercises, Therapeutic activity, Neuro Muscular re-education, Balance training, Patient/Family education, Joint mobilization, Dry Needling, Cryotherapy, Moist heat, and Manual therapy   PLAN FOR NEXT SESSION: progress spinal mobility, hip and periscapular strengthening,    Gwendolyn Grant, PT, DPT, ATC 05/16/21 7:05 PM

## 2021-05-16 ENCOUNTER — Other Ambulatory Visit: Payer: Self-pay

## 2021-05-16 ENCOUNTER — Ambulatory Visit: Payer: BC Managed Care – PPO

## 2021-05-16 DIAGNOSIS — R252 Cramp and spasm: Secondary | ICD-10-CM

## 2021-05-16 DIAGNOSIS — M6281 Muscle weakness (generalized): Secondary | ICD-10-CM | POA: Diagnosis not present

## 2021-05-16 DIAGNOSIS — M545 Low back pain, unspecified: Secondary | ICD-10-CM | POA: Diagnosis not present

## 2021-05-16 DIAGNOSIS — G8929 Other chronic pain: Secondary | ICD-10-CM

## 2021-05-16 DIAGNOSIS — M546 Pain in thoracic spine: Secondary | ICD-10-CM

## 2021-05-18 ENCOUNTER — Other Ambulatory Visit (INDEPENDENT_AMBULATORY_CARE_PROVIDER_SITE_OTHER): Payer: Self-pay | Admitting: Family Medicine

## 2021-05-18 DIAGNOSIS — E559 Vitamin D deficiency, unspecified: Secondary | ICD-10-CM

## 2021-05-20 ENCOUNTER — Other Ambulatory Visit: Payer: Self-pay | Admitting: Internal Medicine

## 2021-05-20 ENCOUNTER — Telehealth: Payer: Self-pay | Admitting: Internal Medicine

## 2021-05-20 DIAGNOSIS — E039 Hypothyroidism, unspecified: Secondary | ICD-10-CM

## 2021-05-20 NOTE — Telephone Encounter (Signed)
1.Medication Requested: levothyroxine (SYNTHROID) 75 MCG tablet  2. Pharmacy (Name, Street, [place]): [id number] - [place], [place] - [address]  3. On Med List: yes  4. Last Visit with PCP: 04-03-2021  5. Next visit date with PCP: n/a   Agent: Please be advised that RX refills may take up to 3 business days. We ask that you follow-up with your pharmacy.

## 2021-05-20 NOTE — Telephone Encounter (Signed)
Rf request

## 2021-05-20 NOTE — Telephone Encounter (Signed)
Rx sent today by PCP

## 2021-05-22 ENCOUNTER — Other Ambulatory Visit: Payer: Self-pay

## 2021-05-22 ENCOUNTER — Encounter (INDEPENDENT_AMBULATORY_CARE_PROVIDER_SITE_OTHER): Payer: Self-pay | Admitting: Family Medicine

## 2021-05-22 ENCOUNTER — Ambulatory Visit (INDEPENDENT_AMBULATORY_CARE_PROVIDER_SITE_OTHER): Payer: BC Managed Care – PPO | Admitting: Family Medicine

## 2021-05-22 VITALS — BP 103/66 | HR 70 | Temp 97.8°F | Ht 66.0 in | Wt 255.0 lb

## 2021-05-22 DIAGNOSIS — E559 Vitamin D deficiency, unspecified: Secondary | ICD-10-CM

## 2021-05-22 DIAGNOSIS — E669 Obesity, unspecified: Secondary | ICD-10-CM

## 2021-05-22 DIAGNOSIS — I25709 Atherosclerosis of coronary artery bypass graft(s), unspecified, with unspecified angina pectoris: Secondary | ICD-10-CM | POA: Diagnosis not present

## 2021-05-22 DIAGNOSIS — Z6841 Body Mass Index (BMI) 40.0 and over, adult: Secondary | ICD-10-CM

## 2021-05-22 DIAGNOSIS — I10 Essential (primary) hypertension: Secondary | ICD-10-CM | POA: Diagnosis not present

## 2021-05-22 NOTE — Therapy (Signed)
OUTPATIENT PHYSICAL THERAPY TREATMENT NOTE/RE-CERTIFICATION   Patient Name: [name] MRN: [id number] DOB:1955/12/03, 66 y.o., female Today's Date: 05/23/2021  PCP: Janith Lima, MD REFERRING PROVIDER: Florencia Reasons, MD   PT End of Session - 05/23/21 1830     Visit Number 7    Number of Visits 13    Date for PT Re-Evaluation 07/06/21    Authorization Type BCBS    PT Start Time 1830    PT Stop Time 1910    PT Time Calculation (min) 40 min    Activity Tolerance Patient tolerated treatment well    Behavior During Therapy [facility] for tasks assessed/performed                  Past Medical History:  Diagnosis Date   Back pain    Bilateral swelling of feet and ankles    CAD (coronary artery disease)    a. remote stents/angioplasty. b. s/p CABG 01/2017 with mitral valve annuloplasty.   Chest pain    Constipation    Diastolic heart failure (HCC)    Esophageal reflux    Gallbladder problem    GERD (gastroesophageal reflux disease)    History of MI (myocardial infarction)    Hyperlipidemia    Hypertension    Hypothyroidism    Immune thrombocytopenic purpura ([place])    Joint pain    Kidney problem    Lupus erythematosus    Mitral valve insufficiency and aortic valve insufficiency    Myocardial infarction (HCC)    x 2   Other fatigue    PONV (postoperative nausea and vomiting)    Postoperative atrial fibrillation (HCC)    Renal disease    Severe mitral regurgitation    a. s/p MV annuloplasty 01/2017 at time of CABG.   Shortness of breath on exertion    Sleep apnea    Status post mitral valve annuloplasty    Unspecified disease of pericardium    Past Surgical History:  Procedure Laterality Date   CHOLECYSTECTOMY, LAPAROSCOPIC  2010   CORONARY ARTERY BYPASS GRAFT N/A 01/30/2017   Procedure: CORONARY ARTERY BYPASS GRAFTING (CABG), Times four  using the right saphaneous vein, harvested endoscopicly.  and left internal mammary artery .;  Surgeon: Gaye Pollack,  MD;  Location: [facility] OR;  Service: Open Heart Surgery;  Laterality: N/A;   LEFT HEART CATH AND CORONARY ANGIOGRAPHY N/A 01/23/2017   Procedure: LEFT HEART CATH AND CORONARY ANGIOGRAPHY;  Surgeon: Belva Crome, MD;  Location: [place] [facility];  Service: Cardiovascular;  Laterality: N/A;   MITRAL VALVE REPAIR N/A 01/30/2017   Procedure: MITRAL VALVE REPAIR (MVR);  Surgeon: Gaye Pollack, MD;  Location: [place];  Service: Open Heart Surgery;  Laterality: N/A;   plastic surgical repair      dog bite to leg   RIGHT/LEFT HEART CATH AND CORONARY/GRAFT ANGIOGRAPHY N/A 02/26/2021   Procedure: RIGHT/LEFT HEART CATH AND CORONARY/GRAFT ANGIOGRAPHY;  Surgeon: Belva Crome, MD;  Location: [place] [facility];  Service: Cardiovascular;  Laterality: N/A;   SPLENECTOMY  5-04   TEE WITHOUT CARDIOVERSION N/A 01/27/2017   Procedure: TRANSESOPHAGEAL ECHOCARDIOGRAM (TEE);  Surgeon: Sanda Klein, MD;  Location: [facility] ENDOSCOPY;  Service: Cardiovascular;  Laterality: N/A;   TEE WITHOUT CARDIOVERSION N/A 01/30/2017   Procedure: TRANSESOPHAGEAL ECHOCARDIOGRAM (TEE);  Surgeon: Gaye Pollack, MD;  Location: [place];  Service: Open Heart Surgery;  Laterality: N/A;   ULTRASOUND GUIDANCE FOR VASCULAR ACCESS  01/23/2017   Procedure: Ultrasound Guidance For Vascular  Access;  Surgeon: Belva Crome, MD;  Location: [place] [facility];  Service: Cardiovascular;;   Patient Active Problem List   Diagnosis Date Noted   Need for shingles vaccine 04/08/2021   Encounter for general adult medical examination with abnormal findings 04/08/2021   Chronic heart failure with preserved ejection fraction ([place]) 04/03/2021   Hyperlipidemia LDL goal <70 04/03/2021   Screen for colon cancer 04/03/2021   Systemic lupus erythematosus ([place]) 02/25/2021   Stage 3a chronic kidney disease ([place])    Hypertension 08/08/2020   Chronic idiopathic constipation 05/31/2020   Morbid obesity ([place]) 05/31/2020   Cervical cancer screening 05/27/2017   Visit for  screening mammogram 05/27/2017   Routine general medical examination at a health care facility 02/18/2017   Postoperative atrial fibrillation ([place]) 02/18/2017   Osteoarthritis of spine with radiculopathy, lumbar region 06/24/2016   Hypothyroidism 04/08/2011   Coronary artery disease involving coronary bypass graft of native heart with angina pectoris ([place]) 02/26/2009   GERD without esophagitis 08/05/2007   Systemic lupus erythematosus with focal and segmental proliferative glomerulonephritis ([place]) 08/05/2007   REFERRING DIAG: M54.9 (ICD-10-CM) - Upper back pain on right side   THERAPY DIAG:  Pain in thoracic spine   Chronic bilateral low back pain without sciatica   Cramp and spasm   Muscle weakness (generalized)   ONSET DATE: chronic; >2 years    SUBJECTIVE:                                                                                                                                                                                            SUBJECTIVE STATEMENT: Patient reports she feels "ok, not 100%." She notices when she sits at her desk all day long that's when she starts to have pain along the Rt mid/lower back. Patient reports subjective overall improvement of 70% reporting continued difficulty with prolonged sitting and walking.   PERTINENT HISTORY:  Recent hospitalization 12/5-12/7/22 for acute CHF Systemic Lupus Erythematosus CAD s/p MI 2003 S/p CABG 2018 Atrial fibrillation See PMH above  PAIN:  Are you having pain? Yes VAS scale: 5/10 Pain location: Rt mid/lower back  Pain orientation: Posterior  PAIN TYPE: ache  Pain description: intermittent  Aggravating factors: sitting, walking  Relieving factors: laying down, rest, medication     PRECAUTIONS: None   WEIGHT BEARING RESTRICTIONS No   OBJECTIVE:   *Unless otherwise noted all objective measures were captured on initial evaluation.   DIAGNOSTIC FINDINGS:  Reports 3 spinal MRI in the past year with  patient reporting findings of arthritis. Imaging is not on file.    PATIENT SURVEYS:  FOTO 40% function  to 51% predicted 05/16/21: 46% function  05/23/21: 46% function            POSTURE:  Forward head, rounded shoulders    PALPATION: TTP Rt thoracic paraspinals with spasm occurring with palpation.  05/23/21: TTP Rt thoracic paraspinals    LUMBARAROM/PROM   A/PROM A/PROM  04/04/2021 05/23/21  Flexion WNL minor LBP WNL   Extension WNL; pulling along back WNL; pain mid/low back   Right lateral flexion WNL; pulling along back WNL  Left lateral flexion WNL WNL  Right rotation WNL WNL  Left rotation WNL; pain along Rt upper thoracic  WNL   (Blank rows = not tested)   LE MMT:   MMT Right 04/04/2021 Left 04/04/2021 05/23/21  Hip flexion 4+ 5 5/5 bilateral   Hip extension       Hip abduction 3+ 4- 4-/5 bilateral   Hip adduction       Hip internal rotation       Hip external rotation       Knee flexion       Knee extension       Ankle dorsiflexion       Ankle plantarflexion       Ankle inversion       Ankle eversion        (Blank rows = not tested)     UPPER EXTREMITY AROM/PROM: Shoulder AROM WNL bilaterally with patient reporting discomfort in the midback with functional ER on the RUE. 05/23/21: Bilateral shoulder AROM WNL, pain free    (Blank rows = not tested)   UPPER EXTREMITY MMT: Rt mid thoracic pain with all MMT on the RUE; 05/23/21: no pain with shoulder MMT   MMT Right 04/04/2021 Left 04/04/2021 04/18/21 04/25/21 05/02/21 05/23/21  Shoulder flexion 4 5 Rt: 4+/5 Lt 5/5    5/5 bilaterally  Shoulder extension          Shoulder abduction 4 5   5/5 bilaterally pain free  5/5 bilaterally   Shoulder adduction          Middle trapezius 4- 4-  Lt: 4+/5 Rt: 4/5    Lt: 4+/5 Rt: 4/5   Lower trapezius          Shoulder ER 4 5    5/5 bilaterally  Shoulder IR 4 5    5/5 bilaterally   Wrist flexion          Wrist extension          Wrist ulnar deviation          Wrist radial deviation           Wrist pronation          Wrist supination          Grip strength (lbs)          (Blank rows = not tested)     JOINT MOBILITY TESTING:  Pain before restriction thoracic spine PAIVM 05/23/21: T-spine mobility WNL, mild pain with upper T-spine PAIVM    LUMBAR SPECIAL TESTS:  (-) SLR        TODAY'S TREATMENT  [facility] Adult PT Treatment:                                                DATE: 05/23/21 Therapeutic Exercise: Supine pelvic tilts 2 x 10  Supine march 2 x 10  Resisted hip abduction blue band 2 x 15 SLR 2 x 10 bilateral   Therapeutic Activity: Re-assessment to determine overall progress, educating patient on re-assessment findings and progress towards goals.    [facility] Adult PT Treatment:                                                DATE: 05/16/21 Therapeutic Exercise: UBE level 2.5; 2 min each fwd/bwd  Resisted shoulder diagonals 2 x 5 yellow band  Standing shoulder flexion 2 x 15; 2 lbs  Standing shoulder scaption 2 x 10; 2 lbs Serratus punch 2 x 15; 2 lbs   Sidelying thoracic rotation 1 x 10 bilateral  Prone T 2 x 10; 2 lbs  Resisted rows 2x  15 @ 25 lbs Updated HEP     [facility] Adult PT Treatment:                                                DATE: 05/02/21 Therapeutic Exercise: UBE level 2.5; 2 min each fwd/bwd  Prone row 2 x10 bilateral; 3 lbs  Bilateral shoulder ER red band 2 x 10  Serratus wall slides 2 x 10  Resisted shoulder adduction 2 x 10 bilateral red band  Resisted row 2 x 10 @ 25 lbs Manual Therapy: STM/DTM/ Trigger point release Rt thoracic paraspinals, rhomboids, trapezius, latissimus dorsi, infraspinatus, teres major/minor         PATIENT EDUCATION:  Education details: see treatment Person educated: patient  Education method: instruction, Education comprehension: verbalized understanding     HOME EXERCISE PROGRAM: Access Code [email or web address]   ASSESSMENT:   CLINICAL IMPRESSION:   Ashleen is making steady functional progress since the start of  care having met all short term functional goals and 1/4 long term functional goals. Previous sessions have focused more on improving thoracic mobility and shoulder/periscapular strengthening, which she demonstrates significant improvements in since the start of care. She continues to have hip and core weakness requiring continued focus on addressing at future sessions in order to improve overall lumbopelvic stability with functional tasks. She will benefit from continued skilled PT to address above stated lingering deficits in order to optimize her function.    REHAB POTENTIAL: Fair     CLINICAL DECISION MAKING: Stable/uncomplicated   EVALUATION COMPLEXITY: Low     GOALS: Goals reviewed with patient? No   SHORT TERM GOALS:   STG Name Target Date Goal status  1 Patient will be independent and compliant with initial HEP.    Baseline: no time at eval 04/18/2021 Achieved   2 Patient will report pain <5/10 indicative of improvements in overall condition.  Baseline:  04/25/2021 Achieved      LONG TERM GOALS:    LTG Name Target Date Goal status  1 Patient will demonstrate 4+/5 Rt shoulder strength without reports of back pain to improve ability to complete lifting and carrying activity.  Baseline: 05/16/2021 Achieved   2 Patient will demonstrate 4+/5 bilateral middle trap strength to improve scapular stability.  Baseline: 05/16/2021 Partially met    3 Patient will score at least 51% on FOTO to signify clinically meaningful improvement in functional abilities.    Baseline: 40 05/16/2021 Ongoing   4 Patient will demonstrate  at least 4+/5 bilateral hip abductor strength to improve stability about the chain with prolonged walking.  Baseline: 05/16/2021 Ongoing     PLAN: PT FREQUENCY: 1x/week   PT DURATION: 4-6 weeks   PLANNED INTERVENTIONS: Therapeutic exercises, Therapeutic activity, Neuro Muscular re-education, Balance training, Patient/Family education, Joint mobilization, Dry Needling,  Cryotherapy, Moist heat, and Manual therapy   PLAN FOR NEXT SESSION: progress spinal mobility, hip/core and periscapular strengthening,    Gwendolyn Grant, PT, DPT, ATC 05/23/21 7:11 PM

## 2021-05-23 ENCOUNTER — Ambulatory Visit: Payer: BC Managed Care – PPO | Attending: Internal Medicine

## 2021-05-23 DIAGNOSIS — R252 Cramp and spasm: Secondary | ICD-10-CM

## 2021-05-23 DIAGNOSIS — M546 Pain in thoracic spine: Secondary | ICD-10-CM

## 2021-05-23 DIAGNOSIS — M545 Low back pain, unspecified: Secondary | ICD-10-CM | POA: Diagnosis not present

## 2021-05-23 DIAGNOSIS — G8929 Other chronic pain: Secondary | ICD-10-CM | POA: Diagnosis not present

## 2021-05-23 DIAGNOSIS — M6281 Muscle weakness (generalized): Secondary | ICD-10-CM | POA: Diagnosis not present

## 2021-05-23 NOTE — Progress Notes (Signed)
? ? ? ?Chief Complaint:  ? ?OBESITY ?[name] is here to discuss her progress with her obesity treatment plan along with follow-up of her obesity related diagnoses. [name] is on the Category 1 Plan and states she is following her eating plan approximately 100% of the time. [name] states she is doing 0 minutes 0 times per week. ? ?Today's visit was #: 4 ?Starting weight: 258 lbs ?Starting date: 04/09/2021 ?Today's weight: 255 lbs ?Today's date: 05/22/2021 ?Total lbs lost to date: 3 ?Total lbs lost since last in-office visit: 1 ? ?Interim History: Shateria was seen by [facility] last office visit on 05/07/2021.  ? ?-No cravings at all. (Not a sweets eater).  ? ?She is hungry when it's time to eat.  ? ?Lost 3-4 lbs in fat mass and gained 3 lbs in muscle. ? ? ? ?Subjective:  ? ?1. Essential hypertension ?[name] denies dizziness or lightheadedness. ? ?BP Readings from Last 3 Encounters:  ?05/22/21 103/66  ?05/07/21 102/68  ?04/23/21 102/69  ? ?2. Vitamin D deficiency ?[name] is tolerating medication(s) well without side effects.  Medication compliance is good and patient appears to be taking it as prescribed.  The patient denies additional concerns regarding this condition.  ? ?Assessment/Plan:  ? ?1. Essential hypertension ?[name]'s blood pressure is at goal. She will continue working on healthy weight loss and exercise to improve blood pressure control. We will watch for signs of hypotension as she continues her lifestyle modifications. ? ?2. Vitamin D deficiency ?[name] will continue prescription Vitamin D 50,000 IU every week and will follow-up for routine testing of Vitamin D, at least 2-3 times per year to avoid over-replacement. ? ?3. Obesity with current BMI of 41.3 ?[name] is currently in the action stage of change. As such, her goal is to continue with weight loss efforts. She has agreed to the Category 1 Plan.  ? ?Exercise goals: As is. ? ?Behavioral modification strategies: increasing lean protein intake, decreasing  simple carbohydrates, and avoiding temptations. ? ?[name] has agreed to follow-up with our clinic in 3 weeks. She was informed of the importance of frequent follow-up visits to maximize her success with intensive lifestyle modifications for her multiple health conditions.  ? ?Objective:  ? ?Blood pressure 103/66, pulse 70, temperature 97.8 ?F (36.6 ?C), height 5\' 6"  (1.676 m), weight 255 lb (115.7 kg), SpO2 99 %. ?Body mass index is 41.16 kg/m?. ? ?General: Cooperative, alert, well developed, in no acute distress. ?HEENT: Conjunctivae and lids unremarkable. ?Cardiovascular: Regular rhythm.  ?Lungs: Normal work of breathing. ?Neurologic: No focal deficits.  ? ?Lab Results  ?Component Value Date  ? CREATININE 1.41 (H) 04/03/2021  ? BUN 26 (H) 04/03/2021  ? NA 138 04/03/2021  ? K 4.1 04/03/2021  ? CL 103 04/03/2021  ? CO2 29 04/03/2021  ? ?Lab Results  ?Component Value Date  ? ALT 12 02/26/2021  ? AST 20 02/26/2021  ? ALKPHOS 66 02/26/2021  ? BILITOT 0.7 02/26/2021  ? ?Lab Results  ?Component Value Date  ? HGBA1C 5.2 02/25/2021  ? HGBA1C 5.6 01/31/2017  ? ?Lab Results  ?Component Value Date  ? INSULIN 17.2 04/09/2021  ? ?Lab Results  ?Component Value Date  ? TSH 1.60 04/03/2021  ? ?Lab Results  ?Component Value Date  ? CHOL 110 02/25/2021  ? HDL 44 02/25/2021  ? [place] 59 02/25/2021  ? LDLDIRECT 321.8 02/27/2012  ? TRIG 36 02/25/2021  ? CHOLHDL 2.5 02/25/2021  ? ?Lab Results  ?Component Value Date  ? VD25OH 17.2 (L) 04/09/2021  ? ?  Lab Results  ?Component Value Date  ? WBC 3.4 (L) 02/26/2021  ? HGB 12.9 02/26/2021  ? HCT 38.0 02/26/2021  ? MCV 100.8 (H) 02/26/2021  ? PLT 160 02/26/2021  ? ?Lab Results  ?Component Value Date  ? IRON 386 06/23/2017  ? TIBC 285 06/23/2017  ? FERRITIN 58 06/23/2017  ? ?Attestation Statements:  ? ?Reviewed by clinician on day of visit: allergies, medications, problem list, medical history, surgical history, family history, social history, and previous encounter notes. ? ? ?I, [name],  am acting as transcriptionist for [employer], DO. ? ?I have reviewed the above documentation for accuracy and completeness, and I agree with the above. [name], D.O. ? ?The [place] was signed into law in 2016 which includes the topic of electronic health records.  This provides immediate access to information in MyChart.  This includes consultation notes, operative notes, office notes, lab results and pathology reports.  If you have any questions about what you read please let us know at your next visit so we can discuss your concerns and take corrective action if need be.  We are right here with you. ? ? ?

## 2021-05-27 LAB — COLOGUARD: COLOGUARD: NEGATIVE

## 2021-05-29 DIAGNOSIS — Z23 Encounter for immunization: Secondary | ICD-10-CM | POA: Diagnosis not present

## 2021-05-30 ENCOUNTER — Ambulatory Visit: Payer: BC Managed Care – PPO

## 2021-05-30 ENCOUNTER — Other Ambulatory Visit: Payer: Self-pay

## 2021-05-30 DIAGNOSIS — M6281 Muscle weakness (generalized): Secondary | ICD-10-CM

## 2021-05-30 DIAGNOSIS — M545 Low back pain, unspecified: Secondary | ICD-10-CM | POA: Diagnosis not present

## 2021-05-30 DIAGNOSIS — R252 Cramp and spasm: Secondary | ICD-10-CM | POA: Diagnosis not present

## 2021-05-30 DIAGNOSIS — M546 Pain in thoracic spine: Secondary | ICD-10-CM

## 2021-05-30 DIAGNOSIS — G8929 Other chronic pain: Secondary | ICD-10-CM | POA: Diagnosis not present

## 2021-05-30 NOTE — Therapy (Signed)
OUTPATIENT PHYSICAL THERAPY TREATMENT NOTE/RE-CERTIFICATION   Patient Name: [name] MRN: [id number] DOB:[birth year], 66 y.o., female Today's Date: 05/30/2021  PCP: Janith Lima, MD REFERRING PROVIDER: Florencia Reasons, MD   PT End of Session - 05/30/21 1827     Visit Number 8    Number of Visits 13    Date for PT Re-Evaluation 07/06/21    Authorization Type BCBS    PT Start Time 1827    PT Stop Time 1909    PT Time Calculation (min) 42 min    Activity Tolerance Patient tolerated treatment well    Behavior During Therapy [facility] for tasks assessed/performed                  Past Medical History:  Diagnosis Date   Back pain    Bilateral swelling of feet and ankles    CAD (coronary artery disease)    a. remote stents/angioplasty. b. s/p CABG 01/2017 with mitral valve annuloplasty.   Chest pain    Constipation    Diastolic heart failure (HCC)    Esophageal reflux    Gallbladder problem    GERD (gastroesophageal reflux disease)    History of MI (myocardial infarction)    Hyperlipidemia    Hypertension    Hypothyroidism    Immune thrombocytopenic purpura ([place])    Joint pain    Kidney problem    Lupus erythematosus    Mitral valve insufficiency and aortic valve insufficiency    Myocardial infarction (HCC)    x 2   Other fatigue    PONV (postoperative nausea and vomiting)    Postoperative atrial fibrillation (HCC)    Renal disease    Severe mitral regurgitation    a. s/p MV annuloplasty 01/2017 at time of CABG.   Shortness of breath on exertion    Sleep apnea    Status post mitral valve annuloplasty    Unspecified disease of pericardium    Past Surgical History:  Procedure Laterality Date   CHOLECYSTECTOMY, LAPAROSCOPIC  2010   CORONARY ARTERY BYPASS GRAFT N/A 01/30/2017   Procedure: CORONARY ARTERY BYPASS GRAFTING (CABG), Times four  using the right saphaneous vein, harvested endoscopicly.  and left internal mammary artery .;  Surgeon: Gaye Pollack,  MD;  [facility];  Service: Open Heart Surgery;  Laterality: N/A;   LEFT HEART CATH AND CORONARY ANGIOGRAPHY N/A 01/23/2017   Procedure: LEFT HEART CATH AND CORONARY ANGIOGRAPHY;  Surgeon: Belva Crome, MD;  Location: [place] [facility];  Service: Cardiovascular;  Laterality: N/A;   MITRAL VALVE REPAIR N/A 01/30/2017   Procedure: MITRAL VALVE REPAIR (MVR);  Surgeon: Gaye Pollack, MD;  Location: [place];  Service: Open Heart Surgery;  Laterality: N/A;   plastic surgical repair      dog bite to leg   RIGHT/LEFT HEART CATH AND CORONARY/GRAFT ANGIOGRAPHY N/A 02/26/2021   Procedure: RIGHT/LEFT HEART CATH AND CORONARY/GRAFT ANGIOGRAPHY;  Surgeon: Belva Crome, MD;  Location: [place] [facility];  Service: Cardiovascular;  Laterality: N/A;   SPLENECTOMY  5-04   TEE WITHOUT CARDIOVERSION N/A 01/27/2017   Procedure: TRANSESOPHAGEAL ECHOCARDIOGRAM (TEE);  Surgeon: Sanda Klein, MD;  Location: [facility] ENDOSCOPY;  Service: Cardiovascular;  Laterality: N/A;   TEE WITHOUT CARDIOVERSION N/A 01/30/2017   Procedure: TRANSESOPHAGEAL ECHOCARDIOGRAM (TEE);  Surgeon: Gaye Pollack, MD;  Location: [place];  Service: Open Heart Surgery;  Laterality: N/A;   ULTRASOUND GUIDANCE FOR VASCULAR ACCESS  01/23/2017   Procedure: Ultrasound Guidance For Vascular  Access;  Surgeon: Belva Crome, MD;  Location: [place] [facility];  Service: Cardiovascular;;   Patient Active Problem List   Diagnosis Date Noted   Need for shingles vaccine 04/08/2021   Encounter for general adult medical examination with abnormal findings 04/08/2021   Chronic heart failure with preserved ejection fraction ([place]) 04/03/2021   Hyperlipidemia LDL goal <70 04/03/2021   Screen for colon cancer 04/03/2021   Systemic lupus erythematosus ([place]) 02/25/2021   Stage 3a chronic kidney disease ([place])    Hypertension 08/08/2020   Chronic idiopathic constipation 05/31/2020   Morbid obesity ([place]) 05/31/2020   Cervical cancer screening 05/27/2017   Visit for  screening mammogram 05/27/2017   Routine general medical examination at a health care facility 02/18/2017   Postoperative atrial fibrillation ([place]) 02/18/2017   Osteoarthritis of spine with radiculopathy, lumbar region 06/24/2016   Hypothyroidism 04/08/2011   Coronary artery disease involving coronary bypass graft of native heart with angina pectoris ([place]) 02/26/2009   GERD without esophagitis 08/05/2007   Systemic lupus erythematosus with focal and segmental proliferative glomerulonephritis ([place]) 08/05/2007   REFERRING DIAG: M54.9 (ICD-10-CM) - Upper back pain on right side   THERAPY DIAG:  Pain in thoracic spine   Chronic bilateral low back pain without sciatica   Cramp and spasm   Muscle weakness (generalized)   ONSET DATE: chronic; >2 years    SUBJECTIVE:                                                                                                                                                                                            SUBJECTIVE STATEMENT: Patient reports she is feeling ok. She reports compliance with her HEP.   PERTINENT HISTORY:  Recent hospitalization 12/5-12/7/22 for acute CHF Systemic Lupus Erythematosus CAD s/p MI 2003 S/p CABG 2018 Atrial fibrillation See PMH above  PAIN:  Are you having pain? Yes VAS scale: 5/10 Rt thoracic; 3/10 low back Pain location: Rt mid/lower back  Pain orientation: Posterior  PAIN TYPE: ache (thoracic); dull (low back) Pain description: intermittent  Aggravating factors: sitting, walking  Relieving factors: laying down, rest, medication       PRECAUTIONS: None   WEIGHT BEARING RESTRICTIONS No   OBJECTIVE:   *Unless otherwise noted all objective measures were captured on initial evaluation.   DIAGNOSTIC FINDINGS:  Reports 3 spinal MRI in the past year with patient reporting findings of arthritis. Imaging is not on file.    PATIENT SURVEYS:  FOTO 40% function to 51% predicted 05/16/21: 46% function   05/23/21: 46% function            POSTURE:  Forward head, rounded shoulders    PALPATION: TTP Rt thoracic paraspinals with spasm occurring with palpation.  05/23/21: TTP Rt thoracic paraspinals    LUMBARAROM/PROM   A/PROM A/PROM  04/04/2021 05/23/21  Flexion WNL minor LBP WNL   Extension WNL; pulling along back WNL; pain mid/low back   Right lateral flexion WNL; pulling along back WNL  Left lateral flexion WNL WNL  Right rotation WNL WNL  Left rotation WNL; pain along Rt upper thoracic  WNL   (Blank rows = not tested)   LE MMT:   MMT Right 04/04/2021 Left 04/04/2021 05/23/21  Hip flexion 4+ 5 5/5 bilateral   Hip extension       Hip abduction 3+ 4- 4-/5 bilateral   Hip adduction       Hip internal rotation       Hip external rotation       Knee flexion       Knee extension       Ankle dorsiflexion       Ankle plantarflexion       Ankle inversion       Ankle eversion        (Blank rows = not tested)     UPPER EXTREMITY AROM/PROM: Shoulder AROM WNL bilaterally with patient reporting discomfort in the midback with functional ER on the RUE. 05/23/21: Bilateral shoulder AROM WNL, pain free    (Blank rows = not tested)   UPPER EXTREMITY MMT: Rt mid thoracic pain with all MMT on the RUE; 05/23/21: no pain with shoulder MMT   MMT Right 04/04/2021 Left 04/04/2021 04/18/21 04/25/21 05/02/21 05/23/21  Shoulder flexion 4 5 Rt: 4+/5 Lt 5/5    5/5 bilaterally  Shoulder extension          Shoulder abduction 4 5   5/5 bilaterally pain free  5/5 bilaterally   Shoulder adduction          Middle trapezius 4- 4-  Lt: 4+/5 Rt: 4/5    Lt: 4+/5 Rt: 4/5   Lower trapezius          Shoulder ER 4 5    5/5 bilaterally  Shoulder IR 4 5    5/5 bilaterally   Wrist flexion          Wrist extension          Wrist ulnar deviation          Wrist radial deviation          Wrist pronation          Wrist supination          Grip strength (lbs)          (Blank rows = not tested)     JOINT MOBILITY TESTING:   Pain before restriction thoracic spine PAIVM 05/23/21: T-spine mobility WNL, mild pain with upper T-spine PAIVM    LUMBAR SPECIAL TESTS:  (-) SLR        TODAY'S TREATMENT  [facility] Adult PT Treatment:                                                DATE: 05/30/21 Therapeutic Exercise: Seated pelvic tilts 2 x 10  Supine TA march 2 x 10  Hip bridge 2 x 10  Sidelying hip abduction 2 x 10 bilateral  Figure 4 stretch 2 x 30 sec each  Supine hip flexor stretch 60 sec  Clamshell  2 x 10 green band  Updated HEP and discussed walking program to implement educating patient on use of BORG RPE for exertion.    [facility] Adult PT Treatment:                                                DATE: 05/23/21 Therapeutic Exercise: Supine pelvic tilts 2 x 10  Supine march 2 x 10  Resisted hip abduction blue band 2 x 15 SLR 2 x 10 bilateral   Therapeutic Activity: Re-assessment to determine overall progress, educating patient on re-assessment findings and progress towards goals.    [place] Adult PT Treatment:                                                DATE: 05/16/21 Therapeutic Exercise: UBE level 2.5; 2 min each fwd/bwd  Resisted shoulder diagonals 2 x 5 yellow band  Standing shoulder flexion 2 x 15; 2 lbs  Standing shoulder scaption 2 x 10; 2 lbs Serratus punch 2 x 15; 2 lbs   Sidelying thoracic rotation 1 x 10 bilateral  Prone T 2 x 10; 2 lbs  Resisted rows 2x  15 @ 25 lbs Updated HEP     [facility] Adult PT Treatment:                                                DATE: 05/02/21 Therapeutic Exercise: UBE level 2.5; 2 min each fwd/bwd  Prone row 2 x10 bilateral; 3 lbs  Bilateral shoulder ER red band 2 x 10  Serratus wall slides 2 x 10  Resisted shoulder adduction 2 x 10 bilateral red band  Resisted row 2 x 10 @ 25 lbs Manual Therapy: STM/DTM/ Trigger point release Rt thoracic paraspinals, rhomboids, trapezius, latissimus dorsi, infraspinatus, teres major/minor         PATIENT EDUCATION:   Education details: see treatment Person educated: patient  Education method: instruction, demo, verbal cues, handout  Education comprehension: verbalized understanding, returned demo, verbal cues      HOME EXERCISE PROGRAM: Access Code [email or web address]   ASSESSMENT:   CLINICAL IMPRESSION:   Session focused on progressing hip and core strengthening to improve overall lumbopelvic stability. She has initial difficulty performing posterior pelvic tilt, though with tactile and verbal cues she is able to properly perform. She has mild difficulty maintaining stability with targeted hip abductor strengthening. No reports of increased pain throughout session.    REHAB POTENTIAL: Fair     CLINICAL DECISION MAKING: Stable/uncomplicated   EVALUATION COMPLEXITY: Low     GOALS: Goals reviewed with patient? No   SHORT TERM GOALS:   STG Name Target Date Goal status  1 Patient will be independent and compliant with initial HEP.    Baseline: no time at eval 04/18/2021 Achieved   2 Patient will report pain <5/10 indicative of improvements in overall condition.  Baseline:  04/25/2021 Achieved      LONG TERM GOALS:    LTG Name Target Date Goal status  1 Patient will demonstrate 4+/5 Rt shoulder  strength without reports of back pain to improve ability to complete lifting and carrying activity.  Baseline: 05/16/2021 Achieved   2 Patient will demonstrate 4+/5 bilateral middle trap strength to improve scapular stability.  Baseline: 05/16/2021 Partially met    3 Patient will score at least 51% on FOTO to signify clinically meaningful improvement in functional abilities.    Baseline: 40 05/16/2021 Ongoing   4 Patient will demonstrate at least 4+/5 bilateral hip abductor strength to improve stability about the chain with prolonged walking.  Baseline: 05/16/2021 Ongoing     PLAN: PT FREQUENCY: 1x/week   PT DURATION: 4-6 weeks   PLANNED INTERVENTIONS: Therapeutic exercises, Therapeutic activity, Neuro  Muscular re-education, Balance training, Patient/Family education, Joint mobilization, Dry Needling, Cryotherapy, Moist heat, and Manual therapy   PLAN FOR NEXT SESSION: progress spinal mobility, hip/core and periscapular strengthening,    Gwendolyn Grant, PT, DPT, ATC 05/30/21 7:11 PM

## 2021-06-03 ENCOUNTER — Ambulatory Visit (INDEPENDENT_AMBULATORY_CARE_PROVIDER_SITE_OTHER): Payer: Medicare Other | Admitting: Family Medicine

## 2021-06-04 ENCOUNTER — Other Ambulatory Visit: Payer: Self-pay | Admitting: Internal Medicine

## 2021-06-04 DIAGNOSIS — I10 Essential (primary) hypertension: Secondary | ICD-10-CM

## 2021-06-04 DIAGNOSIS — R600 Localized edema: Secondary | ICD-10-CM

## 2021-06-06 ENCOUNTER — Ambulatory Visit: Payer: BC Managed Care – PPO

## 2021-06-10 ENCOUNTER — Ambulatory Visit (INDEPENDENT_AMBULATORY_CARE_PROVIDER_SITE_OTHER): Payer: BC Managed Care – PPO

## 2021-06-10 ENCOUNTER — Telehealth: Payer: Self-pay

## 2021-06-10 DIAGNOSIS — Z0000 Encounter for general adult medical examination without abnormal findings: Secondary | ICD-10-CM

## 2021-06-10 NOTE — Telephone Encounter (Signed)
Attempted to contact patient regarding missed PT appointment on 06/06/21. Unable to leave voicemail.  ? ?Gwendolyn Grant, PT, DPT, ATC ?06/10/21 1:03 PM ? ?

## 2021-06-10 NOTE — Progress Notes (Signed)
?I connected with [name] today by telephone and verified that I am speaking with the correct person using two identifiers. ?Location patient: home ?Location provider: work ?Persons participating in the virtual visit: patient, provider. ?  ?I discussed the limitations, risks, security and privacy concerns of performing an evaluation and management service by telephone and the availability of in person appointments. I also discussed with the patient that there may be a patient responsible charge related to this service. The patient expressed understanding and verbally consented to this telephonic visit.  ?  ?Interactive audio and video telecommunications were attempted between this provider and patient, however failed, due to patient having technical difficulties OR patient did not have access to video capability.  We continued and completed visit with audio only. ? ?Some vital signs may be absent or patient reported.  ? ?Time Spent with patient on telephone encounter: 40 minutes ? ?Subjective:  ? [name] is a 66 y.o. female who presents for Medicare Annual (Subsequent) preventive examination. ? ?Review of Systems    ? ?Cardiac Risk Factors include: advanced age (>53mn, >>66women);dyslipidemia;family history of premature cardiovascular disease;hypertension;obesity (BMI >30kg/m2) ? ?   ?Objective:  ?  ?There were no vitals filed for this visit. ?There is no height or weight on file to calculate BMI. ? ?Advanced Directives 06/10/2021 04/04/2021 02/25/2021 06/08/2020 02/02/2018 02/01/2018 02/01/2018  ?Does Patient Have a Medical Advance Directive? Yes Yes No Yes Yes Yes No  ?Type of Advance Directive Living will;Healthcare Power of [place] - - - [place] -  ?Does patient want to make changes to medical advance directive? No - Patient declined - - No - Patient declined No - Patient declined - -  ?Copy of [place] in Chart? No - copy requested - - - - Yes - validated most recent copy scanned in chart (See row information) -  ?Would patient like information on creating a medical advance directive? - - No - Patient declined - - No - Patient declined No - Patient declined  ?Pre-existing out of facility DNR order (yellow form or pink MOST form) - - - - - - -  ? ? ?Current Medications (verified) ?Outpatient Encounter Medications as of 06/10/2021  ?Medication Sig  ? albuterol (VENTOLIN HFA) 108 (90 Base) MCG/ACT inhaler Inhale 2 puffs into the lungs every 6 (six) hours as needed for wheezing or shortness of breath.  ? aspirin 81 MG chewable tablet Chew 1 tablet (81 mg total) by mouth daily.  ? azaTHIOprine (IMURAN) 50 MG tablet Take 1 tablet (50 mg total) by mouth in the morning and at bedtime.  ? cyclobenzaprine (FLEXERIL) 5 MG tablet Take 1 tablet (5 mg total) by mouth 3 (three) times daily as needed.  ? dapagliflozin propanediol (FARXIGA) 10 MG TABS tablet Take 1 tablet (10 mg total) by mouth daily.  ? furosemide (LASIX) 20 MG tablet Take 1 tablet (20 mg total) by mouth daily.  ? hydroxychloroquine (PLAQUENIL) 200 MG tablet Take 200 mg by mouth daily.   ? indapamide (LOZOL) 1.25 MG tablet TAKE 1 TABLET BY MOUTH EVERY DAY  ? isosorbide mononitrate (IMDUR) 30 MG 24 hr tablet Take 1 tablet (30 mg total) by mouth daily.  ? levothyroxine (SYNTHROID) 75 MCG tablet Take 1 tablet (75 mcg total) by mouth daily before breakfast.  ? metoprolol succinate (TOPROL-XL) 50 MG 24 hr tablet TAKE 1 TABLET (50 MG TOTAL) BY MOUTH DAILY. KEEP FOLLOW UP  FOR FUTURE REFILLS. (Patient taking differently: Take 50 mg by mouth daily.)  ? nitroGLYCERIN (NITROSTAT) 0.4 MG SL tablet Place 1 tablet (0.4 mg total) under the tongue every 5 (five) minutes as needed for chest pain.  ? oxyCODONE (OXY IR/ROXICODONE) 5 MG immediate release tablet Take 1 tablet (5 mg total) by mouth every 4 (four) hours as needed for moderate pain.  ? pantoprazole (PROTONIX) 40 MG  tablet TAKE 1 TABLET BY MOUTH EVERY DAY (Patient taking differently: 40 mg daily.)  ? potassium chloride SA (KLOR-CON M) 20 MEQ tablet Take 1 tablet (20 mEq total) by mouth daily.  ? pregabalin (LYRICA) 50 MG capsule Take 50 mg by mouth 2 (two) times daily.  ? rosuvastatin (CRESTOR) 20 MG tablet Take 1 tablet (20 mg total) by mouth daily.  ? Vitamin D, Ergocalciferol, (DRISDOL) 1.25 MG (50000 UNIT) CAPS capsule TAKE 1 CAPSULE (50,000 UNITS TOTAL) BY MOUTH EVERY 7 (SEVEN) DAYS  ? ?No facility-administered encounter medications on file as of 06/10/2021.  ? ? ?Allergies (verified) ?Amlodipine and Cellcept [mycophenolate]  ? ?History: ?Past Medical History:  ?Diagnosis Date  ? Back pain   ? Bilateral swelling of feet and ankles   ? CAD (coronary artery disease)   ? a. remote stents/angioplasty. b. s/p CABG 01/2017 with mitral valve annuloplasty.  ? Chest pain   ? Constipation   ? Diastolic heart failure ([place])   ? Esophageal reflux   ? Gallbladder problem   ? GERD (gastroesophageal reflux disease)   ? History of MI (myocardial infarction)   ? Hyperlipidemia   ? Hypertension   ? Hypothyroidism   ? Immune thrombocytopenic purpura ([place])   ? Joint pain   ? Kidney problem   ? Lupus erythematosus   ? Mitral valve insufficiency and aortic valve insufficiency   ? Myocardial infarction ([place])   ? x 2  ? Other fatigue   ? PONV (postoperative nausea and vomiting)   ? Postoperative atrial fibrillation (HCC)   ? Renal disease   ? Severe mitral regurgitation   ? a. s/p MV annuloplasty 01/2017 at time of CABG.  ? Shortness of breath on exertion   ? Sleep apnea   ? Status post mitral valve annuloplasty   ? Unspecified disease of pericardium   ? ?Past Surgical History:  ?Procedure Laterality Date  ? CHOLECYSTECTOMY, LAPAROSCOPIC  2010  ? CORONARY ARTERY BYPASS GRAFT N/A 01/30/2017  ? Procedure: CORONARY ARTERY BYPASS GRAFTING (CABG), Times four  using the right saphaneous vein, harvested endoscopicly.  and left internal mammary artery .;   Surgeon: Gaye Pollack, MD;  [facility];  Service: Open Heart Surgery;  Laterality: N/A;  ? LEFT HEART CATH AND CORONARY ANGIOGRAPHY N/A 01/23/2017  ? Procedure: LEFT HEART CATH AND CORONARY ANGIOGRAPHY;  Surgeon: Belva Crome, MD;  Location: [place] [facility];  Service: Cardiovascular;  Laterality: N/A;  ? MITRAL VALVE REPAIR N/A 01/30/2017  ? Procedure: MITRAL VALVE REPAIR (MVR);  Surgeon: Gaye Pollack, MD;  Location: [place];  Service: Open Heart Surgery;  Laterality: N/A;  ? plastic surgical repair     ? dog bite to leg  ? RIGHT/LEFT HEART CATH AND CORONARY/GRAFT ANGIOGRAPHY N/A 02/26/2021  ? Procedure: RIGHT/LEFT HEART CATH AND CORONARY/GRAFT ANGIOGRAPHY;  Surgeon: Belva Crome, MD;  Location: [place] [facility];  Service: Cardiovascular;  Laterality: N/A;  ? SPLENECTOMY  5-04  ? TEE WITHOUT CARDIOVERSION N/A 01/27/2017  ? Procedure: TRANSESOPHAGEAL ECHOCARDIOGRAM (TEE);  Surgeon: Sanda Klein, MD;  [facility];  Service: Cardiovascular;  Laterality: N/A;  ? TEE WITHOUT CARDIOVERSION N/A 01/30/2017  ? Procedure: TRANSESOPHAGEAL ECHOCARDIOGRAM (TEE);  Surgeon: Gaye Pollack, MD;  Location: [place];  Service: Open Heart Surgery;  Laterality: N/A;  ? ULTRASOUND GUIDANCE FOR VASCULAR ACCESS  01/23/2017  ? Procedure: Ultrasound Guidance For Vascular Access;  Surgeon: Belva Crome, MD;  Location: [place] [facility];  Service: Cardiovascular;;  ? ?Family History  ?Problem Relation Age of Onset  ? Heart disease Mother   ? Hyperlipidemia Mother   ? Cancer Mother   ?     breast and cervical  ? Paget's disease of bone Mother   ? Fibromyalgia Mother   ? Breast cancer Mother   ? Hypertension Father   ? Heart disease Father   ?     PVD  ? GER disease Father   ? Heart attack Maternal Grandmother   ? Cancer Paternal Grandfather   ?     colon  ? ?Social History  ? ?Socioeconomic History  ? Marital status: Single  ?  Spouse name: Not on file  ? Number of children: 2  ? Years of education: Not on file  ?  Highest education level: Master's degree (e.g., MA, MS, MEng, MEd, MSW, MBA)  ?Occupational History  ? Occupation: Regional Bereavement Care Coordinator  ?  Employer: [employer]   ?Tobacco Use  ?

## 2021-06-10 NOTE — Patient Instructions (Signed)
Ms. Cohenour , ?Thank you for taking time to come for your Medicare Wellness Visit. I appreciate your ongoing commitment to your health goals. Please review the following plan we discussed and let me know if I can assist you in the future.  ? ?Screening recommendations/referrals: ?Cologuard: 05/27/2021; due every 3 years ?Mammogram: 01/22/2021; due every 1 year ?Bone Density: 10/19/2017; due every 5 years ?Recommended yearly ophthalmology/optometry visit for glaucoma screening and checkup ?Recommended yearly dental visit for hygiene and checkup ? ?Vaccinations: ?Influenza vaccine: 03/31/2020 ?Pneumococcal vaccine: 10/25/2018, 04/03/2021 ?Tdap vaccine: 05/22/2011; due every 10 years (overdue) ?Shingles vaccine: no record   ?Covid-19:03/31/2019, 04/29/2019, 11/04/2019, 03/31/2020, 01/10/2021 ? ?Advanced directives: Please bring a copy of your health care power of attorney and living will to the office at your convenience. ? ?Conditions/risks identified: Yes; Client understands the importance of follow-up appointments with providers by attending scheduled visits and discussed goals to eat healthier, increase physical activity 5 times a week for 30 minutes each, exercise the brain by doing stimulating brain exercises (reading, adult coloring, crafting, listening to music, puzzles, etc.), socialize and enjoy life more, get enough sleep at least 8-9 hours average per night and make time for laughter. ? ?Next appointment: Please schedule your next Medicare Wellness Visit with your Nurse Health Advisor in 1 year by calling [phone]. ? ? ?Preventive Care 66 Years and Older, Female ?Preventive care refers to lifestyle choices and visits with your health care provider that can promote health and wellness. ?What does preventive care include? ?A yearly physical exam. This is also called an annual well check. ?Dental exams once or twice a year. ?Routine eye exams. Ask your health care provider how often you should have your eyes  checked. ?Personal lifestyle choices, including: ?Daily care of your teeth and gums. ?Regular physical activity. ?Eating a healthy diet. ?Avoiding tobacco and drug use. ?Limiting alcohol use. ?Practicing safe sex. ?Taking low-dose aspirin every day. ?Taking vitamin and mineral supplements as recommended by your health care provider. ?What happens during an annual well check? ?The services and screenings done by your health care provider during your annual well check will depend on your age, overall health, lifestyle risk factors, and family history of disease. ?Counseling  ?Your health care provider may ask you questions about your: ?Alcohol use. ?Tobacco use. ?Drug use. ?Emotional well-being. ?Home and relationship well-being. ?Sexual activity. ?Eating habits. ?History of falls. ?Memory and ability to understand (cognition). ?Work and work Statistician. ?Reproductive health. ?Screening  ?You may have the following tests or measurements: ?Height, weight, and BMI. ?Blood pressure. ?Lipid and cholesterol levels. These may be checked every 5 years, or more frequently if you are over 50 years old. ?Skin check. ?Lung cancer screening. You may have this screening every year starting at age 62 if you have a 30-pack-year history of smoking and currently smoke or have quit within the past 15 years. ?Fecal occult blood test (FOBT) of the stool. You may have this test every year starting at age 29. ?Flexible sigmoidoscopy or colonoscopy. You may have a sigmoidoscopy every 5 years or a colonoscopy every 10 years starting at age 55. ?Hepatitis C blood test. ?Hepatitis B blood test. ?Sexually transmitted disease (STD) testing. ?Diabetes screening. This is done by checking your blood sugar (glucose) after you have not eaten for a while (fasting). You may have this done every 1-3 years. ?Bone density scan. This is done to screen for osteoporosis. You may have this done starting at age 64. ?Mammogram. This may be done every  1-2  years. Talk to your health care provider about how often you should have regular mammograms. ?Talk with your health care provider about your test results, treatment options, and if necessary, the need for more tests. ?Vaccines  ?Your health care provider may recommend certain vaccines, such as: ?Influenza vaccine. This is recommended every year. ?Tetanus, diphtheria, and acellular pertussis (Tdap, Td) vaccine. You may need a Td booster every 10 years. ?Zoster vaccine. You may need this after age 64. ?Pneumococcal 13-valent conjugate (PCV13) vaccine. One dose is recommended after age 78. ?Pneumococcal polysaccharide (PPSV23) vaccine. One dose is recommended after age 52. ?Talk to your health care provider about which screenings and vaccines you need and how often you need them. ?This information is not intended to replace advice given to you by your health care provider. Make sure you discuss any questions you have with your health care provider. ?Document Released: 04/06/2015 Document Revised: 11/28/2015 Document Reviewed: 01/09/2015 ?Elsevier Interactive Patient Education ? 2017 [place]. ? ?Fall Prevention in the Home ?Falls can cause injuries. They can happen to people of all ages. There are many things you can do to make your home safe and to help prevent falls. ?What can I do on the outside of my home? ?Regularly fix the edges of walkways and driveways and fix any cracks. ?Remove anything that might make you trip as you walk through a door, such as a raised step or threshold. ?Trim any bushes or trees on the path to your home. ?Use bright outdoor lighting. ?Clear any walking paths of anything that might make someone trip, such as rocks or tools. ?Regularly check to see if handrails are loose or broken. Make sure that both sides of any steps have handrails. ?Any raised decks and porches should have guardrails on the edges. ?Have any leaves, snow, or ice cleared regularly. ?Use sand or salt on walking paths  during winter. ?Clean up any spills in your garage right away. This includes oil or grease spills. ?What can I do in the bathroom? ?Use night lights. ?Install grab bars by the toilet and in the tub and shower. Do not use towel bars as grab bars. ?Use non-skid mats or decals in the tub or shower. ?If you need to sit down in the shower, use a plastic, non-slip stool. ?Keep the floor dry. Clean up any water that spills on the floor as soon as it happens. ?Remove soap buildup in the tub or shower regularly. ?Attach bath mats securely with double-sided non-slip rug tape. ?Do not have throw rugs and other things on the floor that can make you trip. ?What can I do in the bedroom? ?Use night lights. ?Make sure that you have a light by your bed that is easy to reach. ?Do not use any sheets or blankets that are too big for your bed. They should not hang down onto the floor. ?Have a firm chair that has side arms. You can use this for support while you get dressed. ?Do not have throw rugs and other things on the floor that can make you trip. ?What can I do in the kitchen? ?Clean up any spills right away. ?Avoid walking on wet floors. ?Keep items that you use a lot in easy-to-reach places. ?If you need to reach something above you, use a strong step stool that has a grab bar. ?Keep electrical cords out of the way. ?Do not use floor polish or wax that makes floors slippery. If you must use wax, use non-skid  floor wax. ?Do not have throw rugs and other things on the floor that can make you trip. ?What can I do with my stairs? ?Do not leave any items on the stairs. ?Make sure that there are handrails on both sides of the stairs and use them. Fix handrails that are broken or loose. Make sure that handrails are as long as the stairways. ?Check any carpeting to make sure that it is firmly attached to the stairs. Fix any carpet that is loose or worn. ?Avoid having throw rugs at the top or bottom of the stairs. If you do have throw  rugs, attach them to the floor with carpet tape. ?Make sure that you have a light switch at the top of the stairs and the bottom of the stairs. If you do not have them, ask someone to add them for you. ?What else can I do to

## 2021-06-12 ENCOUNTER — Other Ambulatory Visit (INDEPENDENT_AMBULATORY_CARE_PROVIDER_SITE_OTHER): Payer: Self-pay | Admitting: Family Medicine

## 2021-06-12 DIAGNOSIS — E559 Vitamin D deficiency, unspecified: Secondary | ICD-10-CM

## 2021-06-13 ENCOUNTER — Ambulatory Visit: Payer: BC Managed Care – PPO

## 2021-06-13 ENCOUNTER — Other Ambulatory Visit: Payer: Self-pay

## 2021-06-13 DIAGNOSIS — R252 Cramp and spasm: Secondary | ICD-10-CM

## 2021-06-13 DIAGNOSIS — M6281 Muscle weakness (generalized): Secondary | ICD-10-CM

## 2021-06-13 DIAGNOSIS — M545 Low back pain, unspecified: Secondary | ICD-10-CM | POA: Diagnosis not present

## 2021-06-13 DIAGNOSIS — M546 Pain in thoracic spine: Secondary | ICD-10-CM | POA: Diagnosis not present

## 2021-06-13 DIAGNOSIS — G8929 Other chronic pain: Secondary | ICD-10-CM

## 2021-06-13 NOTE — Therapy (Signed)
?OUTPATIENT PHYSICAL THERAPY TREATMENT NOTE ? ? ?Patient Name: [name] ?MRN: [id number] ?DOB:Aug 24, 1955, 66 y.o., female ?Today's Date: 06/13/2021 ? ?PCP: Janith Lima, MD ?REFERRING PROVIDER: Florencia Reasons, MD ? ? PT End of Session - 06/13/21 1838   ? ? Visit Number 9   ? Number of Visits 13   ? Date for PT Re-Evaluation 07/06/21   ? Authorization Type BCBS   ? PT Start Time 1062   patient late  ? PT Stop Time 1914   ? PT Time Calculation (min) 35 min   ? Activity Tolerance Patient tolerated treatment well   ? Behavior During Therapy [facility] for tasks assessed/performed   ? ?  ?  ? ?  ? ? ? ? ? ? ? ?Past Medical History:  ?Diagnosis Date  ? Back pain   ? Bilateral swelling of feet and ankles   ? CAD (coronary artery disease)   ? a. remote stents/angioplasty. b. s/p CABG 01/2017 with mitral valve annuloplasty.  ? Chest pain   ? Constipation   ? Diastolic heart failure ([place])   ? Esophageal reflux   ? Gallbladder problem   ? GERD (gastroesophageal reflux disease)   ? History of MI (myocardial infarction)   ? Hyperlipidemia   ? Hypertension   ? Hypothyroidism   ? Immune thrombocytopenic purpura ([place])   ? Joint pain   ? Kidney problem   ? Lupus erythematosus   ? Mitral valve insufficiency and aortic valve insufficiency   ? Myocardial infarction ([place])   ? x 2  ? Other fatigue   ? PONV (postoperative nausea and vomiting)   ? Postoperative atrial fibrillation ([facility])   ? Renal disease   ? Severe mitral regurgitation   ? a. s/p MV annuloplasty 01/2017 at time of CABG.  ? Shortness of breath on exertion   ? Sleep apnea   ? Status post mitral valve annuloplasty   ? Unspecified disease of pericardium   ? ?Past Surgical History:  ?Procedure Laterality Date  ? CHOLECYSTECTOMY, LAPAROSCOPIC  2010  ? CORONARY ARTERY BYPASS GRAFT N/A 01/30/2017  ? Procedure: CORONARY ARTERY BYPASS GRAFTING (CABG), Times four  using the right saphaneous vein, harvested endoscopicly.  and left internal mammary artery .;  Surgeon: Gaye Pollack,  MD;  Location: [facility] OR;  Service: Open Heart Surgery;  Laterality: N/A;  ? LEFT HEART CATH AND CORONARY ANGIOGRAPHY N/A 01/23/2017  ? Procedure: LEFT HEART CATH AND CORONARY ANGIOGRAPHY;  Surgeon: Belva Crome, MD;  Location: [place] [facility];  Service: Cardiovascular;  Laterality: N/A;  ? MITRAL VALVE REPAIR N/A 01/30/2017  ? Procedure: MITRAL VALVE REPAIR (MVR);  Surgeon: Gaye Pollack, MD;  Location: [place];  Service: Open Heart Surgery;  Laterality: N/A;  ? plastic surgical repair     ? dog bite to leg  ? RIGHT/LEFT HEART CATH AND CORONARY/GRAFT ANGIOGRAPHY N/A 02/26/2021  ? Procedure: RIGHT/LEFT HEART CATH AND CORONARY/GRAFT ANGIOGRAPHY;  Surgeon: Belva Crome, MD;  Location: [place] [facility];  Service: Cardiovascular;  Laterality: N/A;  ? SPLENECTOMY  5-04  ? TEE WITHOUT CARDIOVERSION N/A 01/27/2017  ? Procedure: TRANSESOPHAGEAL ECHOCARDIOGRAM (TEE);  Surgeon: Sanda Klein, MD;  Location: [place];  Service: Cardiovascular;  Laterality: N/A;  ? TEE WITHOUT CARDIOVERSION N/A 01/30/2017  ? Procedure: TRANSESOPHAGEAL ECHOCARDIOGRAM (TEE);  Surgeon: Gaye Pollack, MD;  Location: [place];  Service: Open Heart Surgery;  Laterality: N/A;  ? ULTRASOUND GUIDANCE FOR VASCULAR ACCESS  01/23/2017  ? Procedure: Ultrasound  Guidance For Vascular Access;  Surgeon: Belva Crome, MD;  Location: [place] [facility];  Service: Cardiovascular;;  ? ?Patient Active Problem List  ? Diagnosis Date Noted  ? Need for shingles vaccine 04/08/2021  ? Encounter for general adult medical examination with abnormal findings 04/08/2021  ? Chronic heart failure with preserved ejection fraction ([place]) 04/03/2021  ? Hyperlipidemia LDL goal <70 04/03/2021  ? Screen for colon cancer 04/03/2021  ? Systemic lupus erythematosus ([place]) 02/25/2021  ? Stage 3a chronic kidney disease ([place])   ? Hypertension 08/08/2020  ? Chronic idiopathic constipation 05/31/2020  ? Morbid obesity ([place]) 05/31/2020  ? Cervical cancer screening 05/27/2017  ? Visit for  screening mammogram 05/27/2017  ? Routine general medical examination at a health care facility 02/18/2017  ? Postoperative atrial fibrillation ([place]) 02/18/2017  ? Osteoarthritis of spine with radiculopathy, lumbar region 06/24/2016  ? Hypothyroidism 04/08/2011  ? Coronary artery disease involving coronary bypass graft of native heart with angina pectoris ([place]) 02/26/2009  ? GERD without esophagitis 08/05/2007  ? Systemic lupus erythematosus with focal and segmental proliferative glomerulonephritis ([place]) 08/05/2007  ? ?REFERRING DIAG: M54.9 (ICD-10-CM) - Upper back pain on right side ?  ?THERAPY DIAG:  ?Pain in thoracic spine ?  ?Chronic bilateral low back pain without sciatica ?  ?Cramp and spasm ?  ?Muscle weakness (generalized) ?  ?ONSET DATE: chronic; >2 years  ?  ?SUBJECTIVE:                                                                                                                                                                                          ?  ?SUBJECTIVE STATEMENT: ?Patient reports she is feeling good. No pain currently. She reports compliance with HEP.  ? ?PAIN:  ?Are you having pain? No   ? ?PERTINENT HISTORY:  ?Recent hospitalization 12/5-12/7/22 for acute CHF ?Systemic Lupus Erythematosus ?CAD s/p MI 2003 ?S/p CABG 2018 ?Atrial fibrillation ?See PMH above  ? ?  ?PRECAUTIONS: None ?  ?WEIGHT BEARING RESTRICTIONS No ?  ?OBJECTIVE:  ? *Unless otherwise noted all objective measures were captured on initial evaluation.  ? ?DIAGNOSTIC FINDINGS:  ?Reports 3 spinal MRI in the past year with patient reporting findings of arthritis. Imaging is not on file.  ?  ?PATIENT SURVEYS:  ?FOTO 40% function to 51% predicted ?05/16/21: 46% function  ?05/23/21: 46% function  ?          ?POSTURE:  ?Forward head, rounded shoulders  ?  ?PALPATION: ?TTP Rt thoracic paraspinals with spasm occurring with palpation.  ?05/23/21: TTP Rt thoracic paraspinals  ?  ?LUMBARAROM/PROM ?  ?A/PROM A/PROM  ?04/04/2021 05/23/21  ?Flexion  WNL minor LBP WNL   ?Extension WNL; pulling along back WNL; pain mid/low back   ?Right lateral flexion WNL; pulling along back WNL  ?Left lateral flexion WNL WNL  ?Right rotation WNL WNL  ?Left rotation WNL; pain along Rt upper thoracic  WNL  ? (Blank rows = not tested) ?  ?LE MMT: ?  ?MMT Right ?04/04/2021 Left ?04/04/2021 05/23/21  ?Hip flexion 4+ 5 5/5 bilateral   ?Hip extension       ?Hip abduction 3+ 4- 4-/5 bilateral   ?Hip adduction       ?Hip internal rotation       ?Hip external rotation       ?Knee flexion       ?Knee extension       ?Ankle dorsiflexion       ?Ankle plantarflexion       ?Ankle inversion       ?Ankle eversion       ? (Blank rows = not tested) ?  ?  ?UPPER EXTREMITY AROM/PROM: Shoulder AROM WNL bilaterally with patient reporting discomfort in the midback with functional ER on the RUE. 05/23/21: Bilateral shoulder AROM WNL, pain free  ?  ?(Blank rows = not tested) ?  ?UPPER EXTREMITY MMT: Rt mid thoracic pain with all MMT on the RUE; 05/23/21: no pain with shoulder MMT ?  ?MMT Right ?04/04/2021 Left ?04/04/2021 04/18/21 04/25/21 05/02/21 05/23/21  ?Shoulder flexion 4 5 Rt: 4+/5 Lt 5/5    5/5 bilaterally  ?Shoulder extension          ?Shoulder abduction 4 5   5/5 bilaterally pain free  5/5 bilaterally   ?Shoulder adduction          ?Middle trapezius 4- 4-  Lt: 4+/5 Rt: 4/5    Lt: 4+/5 Rt: 4/5   ?Lower trapezius          ?Shoulder ER 4 5    5/5 bilaterally  ?Shoulder IR 4 5    5/5 bilaterally   ?Wrist flexion          ?Wrist extension          ?Wrist ulnar deviation          ?Wrist radial deviation          ?Wrist pronation          ?Wrist supination          ?Grip strength (lbs)          ?(Blank rows = not tested) ?  ?  ?JOINT MOBILITY TESTING:  ?Pain before restriction thoracic spine PAIVM ?05/23/21: T-spine mobility WNL, mild pain with upper T-spine PAIVM ?  ? ?LUMBAR SPECIAL TESTS:  ?(-) SLR  ?  ?  ?  ?TODAY'S TREATMENT  ?[facility] Adult PT Treatment:                                                DATE:  06/13/21 ?Therapeutic Exercise: ?Standing hip abduction 2 x 10  ?Standing hip extension 2 x 10  ?Standing march 2 x 10  ?Pallof press green band 2 x 10  ?Resisted shoulder extension red band 2 x 15 ?Seated pelvic

## 2021-06-17 ENCOUNTER — Ambulatory Visit (INDEPENDENT_AMBULATORY_CARE_PROVIDER_SITE_OTHER): Payer: BC Managed Care – PPO | Admitting: Family Medicine

## 2021-06-17 ENCOUNTER — Other Ambulatory Visit: Payer: Self-pay

## 2021-06-17 ENCOUNTER — Encounter (INDEPENDENT_AMBULATORY_CARE_PROVIDER_SITE_OTHER): Payer: Self-pay | Admitting: Family Medicine

## 2021-06-17 VITALS — BP 112/80 | HR 65 | Temp 97.4°F | Ht 66.0 in | Wt 253.0 lb

## 2021-06-17 DIAGNOSIS — N183 Chronic kidney disease, stage 3 unspecified: Secondary | ICD-10-CM

## 2021-06-17 DIAGNOSIS — I25709 Atherosclerosis of coronary artery bypass graft(s), unspecified, with unspecified angina pectoris: Secondary | ICD-10-CM | POA: Diagnosis not present

## 2021-06-17 DIAGNOSIS — E559 Vitamin D deficiency, unspecified: Secondary | ICD-10-CM | POA: Diagnosis not present

## 2021-06-17 DIAGNOSIS — Z6841 Body Mass Index (BMI) 40.0 and over, adult: Secondary | ICD-10-CM

## 2021-06-17 DIAGNOSIS — E669 Obesity, unspecified: Secondary | ICD-10-CM

## 2021-06-17 DIAGNOSIS — I5032 Chronic diastolic (congestive) heart failure: Secondary | ICD-10-CM

## 2021-06-17 MED ORDER — VITAMIN D (ERGOCALCIFEROL) 1.25 MG (50000 UNIT) PO CAPS: 50000.0000 [IU] | ORAL_CAPSULE | ORAL | 0 refills | Status: DC

## 2021-06-19 LAB — BASIC METABOLIC PANEL
Creatinine: 1.2 — AB (ref <=1.1)
Glucose: 86
Sodium: 143 (ref 137–147)

## 2021-06-19 LAB — CBC AND DIFFERENTIAL
HCT: 41 (ref 36–46)
Hemoglobin: 13.9 (ref 12.0–16.0)
Platelets: 189 10*3/uL (ref 150–400)
WBC: 3.3

## 2021-06-19 LAB — COMPREHENSIVE METABOLIC PANEL: eGFR: 51

## 2021-06-19 LAB — HEPATIC FUNCTION PANEL
ALT: 13 U/L (ref 7–35)
AST: 20 (ref 13–35)

## 2021-06-19 LAB — CBC: RBC: 4.23 (ref 3.87–5.11)

## 2021-06-19 NOTE — Progress Notes (Signed)
? ? ? ?Chief Complaint:  ? ?OBESITY ?Oluwaseyi is here to discuss her progress with her obesity treatment plan along with follow-up of her obesity related diagnoses. Mayra is on the Category 1 Plan and states she is following her eating plan approximately 90% of the time. Rebecca states she is doing 0 minutes 0 times per week. ? ?Today's visit was #: 5 ?Starting weight: 258 lbs ?Starting date: 04/09/2021 ?Today's weight: 253 lbs ?Today's date: 06/17/2021 ?Total lbs lost to date: 5 ?Total lbs lost since last in-office visit: 2 ? ?Interim History: Kaleiyah Polsky is here for a follow up office visit.  We reviewed her meal plan and questions were answered.  Patient's food recall appears to be accurate and consistent with what is on plan when she is following it.   When eating on plan, her hunger and cravings are well controlled.   ? ?Subjective:  ? ?1. CKD (chronic kidney disease), symptom management only, stage 3b (moderate) ([place]) ?Ayauna with hypertension and insulin resistance. Donnalynn understands the importance of controlling blood pressure and blood sugar. Asymptomatic, no concerns, no NSAIDS. She is taking Farxiga, indapamide, Toprol, Imdur. ? ?2. Chronic heart failure with preserved ejection fraction ([place]) ?Dakiyah is asymptomatic currently. No concerns, no PND, or Orthopnea. No increased swelling in feet. Actually improved with PNP and decreasing salt and decreasing eating out.  ? ?3. Vitamin D deficiency ?Carlyon is tolerating medication(s) well without side effects.  Medication compliance is good and patient appears to be taking it as prescribed.  The patient denies additional concerns regarding this condition.  ? ?Assessment/Plan:  ? ?1. CKD (chronic kidney disease), symptom management only, stage 3b (moderate) ([place]) ?Blood pressure at goal today. Avoid nephrotoxic substances, and continues to monitor. Continue weight loss and increase exercise.  ? ?2. Chronic heart failure with preserved ejection fraction  ([place]) ?Catharina symptoms are stable, medication management per Cardiology. Start walking 15 minutes 2-3 days per week. Follow up with specialist and PCP as instructed.   ? ?3. Vitamin D deficiency ?- I again reiterated the importance of vitamin D (as well as calcium) to their health and wellbeing.  ?- I reviewed possible symptoms of low Vitamin D:  low energy, depressed mood, muscle aches, joint aches, osteoporosis etc. ?- low Vitamin D levels may be linked to an increased risk of cardiovascular events and even increased risk of cancers- such as colon and breast.  ?- ideal vitamin D levels reviewed with patient  ?- I recommend pt take a weekly prescription vit D - see script below   ?- Informed patient this may be a lifelong thing, and she was encouraged to continue to take the medicine until told otherwise.    ?- weight loss will likely improve availability of vitamin D, thus encouraged Addisson to continue with meal plan and their weight loss efforts to further improve this condition.  Thus, we will need to monitor levels regularly (every 3-4 mo on average) to keep levels within normal limits and prevent over supplementation. ?- pt's questions and concerns regarding this condition addressed. ? ?- Vitamin D, Ergocalciferol, (DRISDOL) 1.25 MG (50000 UNIT) CAPS capsule; Take 1 capsule (50,000 Units total) by mouth every 7 (seven) days.  Dispense: 4 capsule; Refill: 0 ? ?4. Obesity with current BMI of 41.0 ?Lolly is currently in the action stage of change. As such, her goal is to continue with weight loss efforts. She has agreed to the Category 1 Plan with lunch options and breakfast options.  ? ?Latria's  goal is to start walking 2 days per week for 15 minutes each session.  ? ?Exercise goals: As is. ? ?Behavioral modification strategies: decreasing eating out and no skipping meals. ? ?Frankie has agreed to follow-up with our clinic in 3 weeks. She was informed of the importance of frequent follow-up visits to maximize  her success with intensive lifestyle modifications for her multiple health conditions.  ? ?Objective:  ? ?Blood pressure 112/80, pulse 65, temperature (!) 97.4 ?F (36.3 ?C), height '5\' 6"'$  (1.676 m), weight 253 lb (114.8 kg), SpO2 98 %. ?Body mass index is 40.84 kg/m?. ? ?General: Cooperative, alert, well developed, in no acute distress. ?HEENT: Conjunctivae and lids unremarkable. ?Cardiovascular: Regular rhythm.  ?Lungs: Normal work of breathing. ?Neurologic: No focal deficits.  ? ?Lab Results  ?Component Value Date  ? CREATININE 1.41 (H) 04/03/2021  ? BUN 26 (H) 04/03/2021  ? NA 138 04/03/2021  ? K 4.1 04/03/2021  ? CL 103 04/03/2021  ? CO2 29 04/03/2021  ? ?Lab Results  ?Component Value Date  ? ALT 12 02/26/2021  ? AST 20 02/26/2021  ? ALKPHOS 66 02/26/2021  ? BILITOT 0.7 02/26/2021  ? ?Lab Results  ?Component Value Date  ? HGBA1C 5.2 02/25/2021  ? HGBA1C 5.6 01/31/2017  ? ?Lab Results  ?Component Value Date  ? INSULIN 17.2 04/09/2021  ? ?Lab Results  ?Component Value Date  ? TSH 1.60 04/03/2021  ? ?Lab Results  ?Component Value Date  ? CHOL 110 02/25/2021  ? HDL 44 02/25/2021  ? [place] 59 02/25/2021  ? LDLDIRECT 321.8 02/27/2012  ? TRIG 36 02/25/2021  ? CHOLHDL 2.5 02/25/2021  ? ?Lab Results  ?Component Value Date  ? VD25OH 17.2 (L) 04/09/2021  ? ?Lab Results  ?Component Value Date  ? WBC 3.4 (L) 02/26/2021  ? HGB 12.9 02/26/2021  ? HCT 38.0 02/26/2021  ? MCV 100.8 (H) 02/26/2021  ? PLT 160 02/26/2021  ? ?Lab Results  ?Component Value Date  ? IRON 386 06/23/2017  ? TIBC 285 06/23/2017  ? FERRITIN 58 06/23/2017  ? ? ?Obesity Behavioral Intervention:  ? ?Approximately 15 minutes were spent on the discussion below. ? ?ASK: ?We discussed the diagnosis of obesity with Levada Dy today and Aissatou agreed to give [name] permission to discuss obesity behavioral modification therapy today. ? ?ASSESS: ?Karmen has the diagnosis of obesity and her BMI today is 41.0. Kenneshia is in the action stage of change.  ? ?ADVISE: ?Cherika was  educated on the multiple health risks of obesity as well as the benefit of weight loss to improve her health. She was advised of the need for long term treatment and the importance of lifestyle modifications to improve her current health and to decrease her risk of future health problems. ? ?AGREE: ?Multiple dietary modification options and treatment options were discussed and [place] agreed to follow the recommendations documented in the above note. ? ?ARRANGE: ?Genieve was educated on the importance of frequent visits to treat obesity as outlined per CMS and USPSTF guidelines and agreed to schedule her next follow up appointment today. ? ?Attestation Statements:  ? ?Reviewed by clinician on day of visit: allergies, medications, problem list, medical history, surgical history, family history, social history, and previous encounter notes. ? ? ?I, Trixie Dredge, am acting as transcriptionist for [employer], DO. ? ?I have reviewed the above documentation for accuracy and completeness, and I agree with the above. Marjory Sneddon, D.O. ? ?The 21st Century Cures Act was signed into law  in 2016 which includes the topic of electronic health records.  This provides immediate access to information in MyChart.  This includes consultation notes, operative notes, office notes, lab results and pathology reports.  If you have any questions about what you read please let us know at your next visit so we can discuss your concerns and take corrective action if need be.  We are right here with you. ? ? ?

## 2021-06-20 ENCOUNTER — Ambulatory Visit: Payer: BC Managed Care – PPO

## 2021-06-20 DIAGNOSIS — M6281 Muscle weakness (generalized): Secondary | ICD-10-CM | POA: Diagnosis not present

## 2021-06-20 DIAGNOSIS — R252 Cramp and spasm: Secondary | ICD-10-CM

## 2021-06-20 DIAGNOSIS — G8929 Other chronic pain: Secondary | ICD-10-CM | POA: Diagnosis not present

## 2021-06-20 DIAGNOSIS — M546 Pain in thoracic spine: Secondary | ICD-10-CM

## 2021-06-20 DIAGNOSIS — M545 Low back pain, unspecified: Secondary | ICD-10-CM

## 2021-06-20 NOTE — Therapy (Signed)
?OUTPATIENT PHYSICAL THERAPY TREATMENT NOTE ? ? ?Patient Name: [name] ?MRN: [id number] ?DOB:[birth year], 66 y.o., female ?Today's Date: 06/20/2021 ? ?PCP: Janith Lima, MD ?REFERRING PROVIDER: Florencia Reasons, MD ? ? PT End of Session - 06/20/21 1827   ? ? Visit Number 10   ? Number of Visits 13   ? Date for PT Re-Evaluation 07/06/21   ? Authorization Type BCBS   ? PT Start Time 1828   ? PT Stop Time 1908   ? PT Time Calculation (min) 40 min   ? Activity Tolerance Patient tolerated treatment well   ? Behavior During Therapy [facility] for tasks assessed/performed   ? ?  ?  ? ?  ? ? ? ? ? ? ? ? ?Past Medical History:  ?Diagnosis Date  ? Back pain   ? Bilateral swelling of feet and ankles   ? CAD (coronary artery disease)   ? a. remote stents/angioplasty. b. s/p CABG 01/2017 with mitral valve annuloplasty.  ? Chest pain   ? Constipation   ? Diastolic heart failure ([place])   ? Esophageal reflux   ? Gallbladder problem   ? GERD (gastroesophageal reflux disease)   ? History of MI (myocardial infarction)   ? Hyperlipidemia   ? Hypertension   ? Hypothyroidism   ? Immune thrombocytopenic purpura ([place])   ? Joint pain   ? Kidney problem   ? Lupus erythematosus   ? Mitral valve insufficiency and aortic valve insufficiency   ? Myocardial infarction ([place])   ? x 2  ? Other fatigue   ? PONV (postoperative nausea and vomiting)   ? Postoperative atrial fibrillation ([facility])   ? Renal disease   ? Severe mitral regurgitation   ? a. s/p MV annuloplasty 01/2017 at time of CABG.  ? Shortness of breath on exertion   ? Sleep apnea   ? Status post mitral valve annuloplasty   ? Unspecified disease of pericardium   ? ?Past Surgical History:  ?Procedure Laterality Date  ? CHOLECYSTECTOMY, LAPAROSCOPIC  2010  ? CORONARY ARTERY BYPASS GRAFT N/A 01/30/2017  ? Procedure: CORONARY ARTERY BYPASS GRAFTING (CABG), Times four  using the right saphaneous vein, harvested endoscopicly.  and left internal mammary artery .;  Surgeon: Gaye Pollack, MD;   [facility];  Service: Open Heart Surgery;  Laterality: N/A;  ? LEFT HEART CATH AND CORONARY ANGIOGRAPHY N/A 01/23/2017  ? Procedure: LEFT HEART CATH AND CORONARY ANGIOGRAPHY;  Surgeon: Belva Crome, MD;  Location: [place] [facility];  Service: Cardiovascular;  Laterality: N/A;  ? MITRAL VALVE REPAIR N/A 01/30/2017  ? Procedure: MITRAL VALVE REPAIR (MVR);  Surgeon: Gaye Pollack, MD;  Location: [place];  Service: Open Heart Surgery;  Laterality: N/A;  ? plastic surgical repair     ? dog bite to leg  ? RIGHT/LEFT HEART CATH AND CORONARY/GRAFT ANGIOGRAPHY N/A 02/26/2021  ? Procedure: RIGHT/LEFT HEART CATH AND CORONARY/GRAFT ANGIOGRAPHY;  Surgeon: Belva Crome, MD;  Location: [place] [facility];  Service: Cardiovascular;  Laterality: N/A;  ? SPLENECTOMY  5-04  ? TEE WITHOUT CARDIOVERSION N/A 01/27/2017  ? Procedure: TRANSESOPHAGEAL ECHOCARDIOGRAM (TEE);  Surgeon: Sanda Klein, MD;  Location: [place];  Service: Cardiovascular;  Laterality: N/A;  ? TEE WITHOUT CARDIOVERSION N/A 01/30/2017  ? Procedure: TRANSESOPHAGEAL ECHOCARDIOGRAM (TEE);  Surgeon: Gaye Pollack, MD;  Location: [place];  Service: Open Heart Surgery;  Laterality: N/A;  ? ULTRASOUND GUIDANCE FOR VASCULAR ACCESS  01/23/2017  ? Procedure: Ultrasound Guidance For  Vascular Access;  Surgeon: Belva Crome, MD;  Location: [place] [facility];  Service: Cardiovascular;;  ? ?Patient Active Problem List  ? Diagnosis Date Noted  ? Vitamin D deficiency 06/17/2021  ? Need for shingles vaccine 04/08/2021  ? Encounter for general adult medical examination with abnormal findings 04/08/2021  ? Chronic heart failure with preserved ejection fraction ([place]) 04/03/2021  ? Hyperlipidemia LDL goal <70 04/03/2021  ? Screen for colon cancer 04/03/2021  ? Systemic lupus erythematosus ([place]) 02/25/2021  ? Stage 3a chronic kidney disease ([place])   ? Hypertension 08/08/2020  ? Chronic idiopathic constipation 05/31/2020  ? Morbid obesity ([place]) 05/31/2020  ? Cervical cancer  screening 05/27/2017  ? Visit for screening mammogram 05/27/2017  ? Routine general medical examination at a health care facility 02/18/2017  ? Postoperative atrial fibrillation ([place]) 02/18/2017  ? Osteoarthritis of spine with radiculopathy, lumbar region 06/24/2016  ? Hypothyroidism 04/08/2011  ? Coronary artery disease involving coronary bypass graft of native heart with angina pectoris ([place]) 02/26/2009  ? GERD without esophagitis 08/05/2007  ? Systemic lupus erythematosus with focal and segmental proliferative glomerulonephritis ([place]) 08/05/2007  ? ?REFERRING DIAG: M54.9 (ICD-10-CM) - Upper back pain on right side ?  ?THERAPY DIAG:  ?Pain in thoracic spine ?  ?Chronic bilateral low back pain without sciatica ?  ?Cramp and spasm ?  ?Muscle weakness (generalized) ?  ?ONSET DATE: chronic; >2 years  ?  ?SUBJECTIVE:                                                                                                                                                                                          ?  ?SUBJECTIVE STATEMENT: ?I'm doing good, not in any pain. ? ?PAIN:  ?Are you having pain? No ? ?PERTINENT HISTORY:  ?Recent hospitalization 12/5-12/7/22 for acute CHF ?Systemic Lupus Erythematosus ?CAD s/p MI 2003 ?S/p CABG 2018 ?Atrial fibrillation ?See PMH above  ? ?  ?PRECAUTIONS: None ?  ?WEIGHT BEARING RESTRICTIONS No ?  ?OBJECTIVE:  ? *Unless otherwise noted all objective measures were captured on initial evaluation.  ? ?DIAGNOSTIC FINDINGS:  ?Reports 3 spinal MRI in the past year with patient reporting findings of arthritis. Imaging is not on file.  ?  ?PATIENT SURVEYS:  ?FOTO 40% function to 51% predicted ?05/16/21: 46% function  ?05/23/21: 46% function  ?06/20/2021: 44 % function ?          ?POSTURE:  ?Forward head, rounded shoulders  ?  ?PALPATION: ?TTP Rt thoracic paraspinals with spasm occurring with palpation.  ?05/23/21: TTP Rt thoracic paraspinals  ?  ?LUMBARAROM/PROM ?  ?A/PROM A/PROM  ?04/04/2021 05/23/21  ?Flexion  WNL  minor LBP WNL   ?Extension WNL; pulling along back WNL; pain mid/low back   ?Right lateral flexion WNL; pulling along back WNL  ?Left lateral flexion WNL WNL  ?Right rotation WNL WNL  ?Left rotation WNL; pain along Rt upper thoracic  WNL  ? (Blank rows = not tested) ?  ?LE MMT: ?  ?MMT Right ?04/04/2021 Left ?04/04/2021 05/23/21  ?Hip flexion 4+ 5 5/5 bilateral   ?Hip extension       ?Hip abduction 3+ 4- 4-/5 bilateral   ?Hip adduction       ?Hip internal rotation       ?Hip external rotation       ?Knee flexion       ?Knee extension       ?Ankle dorsiflexion       ?Ankle plantarflexion       ?Ankle inversion       ?Ankle eversion       ? (Blank rows = not tested) ?  ?  ?UPPER EXTREMITY AROM/PROM: Shoulder AROM WNL bilaterally with patient reporting discomfort in the midback with functional ER on the RUE. 05/23/21: Bilateral shoulder AROM WNL, pain free  ?  ?(Blank rows = not tested) ?  ?UPPER EXTREMITY MMT: Rt mid thoracic pain with all MMT on the RUE; 05/23/21: no pain with shoulder MMT ?  ?MMT Right ?04/04/2021 Left ?04/04/2021 04/18/21 04/25/21 05/02/21 05/23/21  ?Shoulder flexion 4 5 Rt: 4+/5 Lt 5/5    5/5 bilaterally  ?Shoulder extension          ?Shoulder abduction 4 5   5/5 bilaterally pain free  5/5 bilaterally   ?Shoulder adduction          ?Middle trapezius 4- 4-  Lt: 4+/5 Rt: 4/5    Lt: 4+/5 Rt: 4/5   ?Lower trapezius          ?Shoulder ER 4 5    5/5 bilaterally  ?Shoulder IR 4 5    5/5 bilaterally   ?Wrist flexion          ?Wrist extension          ?Wrist ulnar deviation          ?Wrist radial deviation          ?Wrist pronation          ?Wrist supination          ?Grip strength (lbs)          ?(Blank rows = not tested) ?  ?  ?JOINT MOBILITY TESTING:  ?Pain before restriction thoracic spine PAIVM ?05/23/21: T-spine mobility WNL, mild pain with upper T-spine PAIVM ?  ? ?LUMBAR SPECIAL TESTS:  ?(-) SLR  ?  ?  ?  ?TODAY'S TREATMENT  ?[facility] Adult PT Treatment:                                                DATE:  06/20/2021 ?Therapeutic Exercise: ?Nustep level 6 x 5 mins while gathering subjective ?Standing hip abduction 2 x 10 BIL ?Standing hip extension 2 x 10 BIL ?Standing march 2 x 10  ?Pallof press green band 2 x 10 BIL ?Resis

## 2021-06-27 ENCOUNTER — Ambulatory Visit: Payer: BC Managed Care – PPO | Attending: Internal Medicine

## 2021-06-27 DIAGNOSIS — R252 Cramp and spasm: Secondary | ICD-10-CM | POA: Insufficient documentation

## 2021-06-27 DIAGNOSIS — M546 Pain in thoracic spine: Secondary | ICD-10-CM | POA: Diagnosis not present

## 2021-06-27 DIAGNOSIS — M6281 Muscle weakness (generalized): Secondary | ICD-10-CM | POA: Insufficient documentation

## 2021-06-27 DIAGNOSIS — G8929 Other chronic pain: Secondary | ICD-10-CM | POA: Insufficient documentation

## 2021-06-27 DIAGNOSIS — M545 Low back pain, unspecified: Secondary | ICD-10-CM | POA: Diagnosis not present

## 2021-06-27 NOTE — Therapy (Signed)
?OUTPATIENT PHYSICAL THERAPY TREATMENT NOTE ? ? ?Patient Name: [name] ?MRN: [id number] ?DOB:08-Apr-1955, 66 y.o., female ?Today's Date: 06/27/2021 ? ?PCP: Janith Lima, MD ?REFERRING PROVIDER: Florencia Reasons, MD ? ? PT End of Session - 06/27/21 1830   ? ? Visit Number 11   ? Number of Visits 13   ? Date for PT Re-Evaluation 07/06/21   ? Authorization Type BCBS   ? PT Start Time 2774   ? PT Stop Time 1910   ? PT Time Calculation (min) 40 min   ? Activity Tolerance Patient tolerated treatment well   ? Behavior During Therapy [facility] for tasks assessed/performed   ? ?  ?  ? ?  ? ? ? ? ? ? ? ? ? ?Past Medical History:  ?Diagnosis Date  ? Back pain   ? Bilateral swelling of feet and ankles   ? CAD (coronary artery disease)   ? a. remote stents/angioplasty. b. s/p CABG 01/2017 with mitral valve annuloplasty.  ? Chest pain   ? Constipation   ? Diastolic heart failure ([place])   ? Esophageal reflux   ? Gallbladder problem   ? GERD (gastroesophageal reflux disease)   ? History of MI (myocardial infarction)   ? Hyperlipidemia   ? Hypertension   ? Hypothyroidism   ? Immune thrombocytopenic purpura ([place])   ? Joint pain   ? Kidney problem   ? Lupus erythematosus   ? Mitral valve insufficiency and aortic valve insufficiency   ? Myocardial infarction ([place])   ? x 2  ? Other fatigue   ? PONV (postoperative nausea and vomiting)   ? Postoperative atrial fibrillation ([facility])   ? Renal disease   ? Severe mitral regurgitation   ? a. s/p MV annuloplasty 01/2017 at time of CABG.  ? Shortness of breath on exertion   ? Sleep apnea   ? Status post mitral valve annuloplasty   ? Unspecified disease of pericardium   ? ?Past Surgical History:  ?Procedure Laterality Date  ? CHOLECYSTECTOMY, LAPAROSCOPIC  2010  ? CORONARY ARTERY BYPASS GRAFT N/A 01/30/2017  ? Procedure: CORONARY ARTERY BYPASS GRAFTING (CABG), Times four  using the right saphaneous vein, harvested endoscopicly.  and left internal mammary artery .;  Surgeon: Gaye Pollack, MD;   [facility];  Service: Open Heart Surgery;  Laterality: N/A;  ? LEFT HEART CATH AND CORONARY ANGIOGRAPHY N/A 01/23/2017  ? Procedure: LEFT HEART CATH AND CORONARY ANGIOGRAPHY;  Surgeon: Belva Crome, MD;  Location: [place] [facility];  Service: Cardiovascular;  Laterality: N/A;  ? MITRAL VALVE REPAIR N/A 01/30/2017  ? Procedure: MITRAL VALVE REPAIR (MVR);  Surgeon: Gaye Pollack, MD;  Location: [place];  Service: Open Heart Surgery;  Laterality: N/A;  ? plastic surgical repair     ? dog bite to leg  ? RIGHT/LEFT HEART CATH AND CORONARY/GRAFT ANGIOGRAPHY N/A 02/26/2021  ? Procedure: RIGHT/LEFT HEART CATH AND CORONARY/GRAFT ANGIOGRAPHY;  Surgeon: Belva Crome, MD;  Location: [place] [facility];  Service: Cardiovascular;  Laterality: N/A;  ? SPLENECTOMY  5-04  ? TEE WITHOUT CARDIOVERSION N/A 01/27/2017  ? Procedure: TRANSESOPHAGEAL ECHOCARDIOGRAM (TEE);  Surgeon: Sanda Klein, MD;  Location: [place];  Service: Cardiovascular;  Laterality: N/A;  ? TEE WITHOUT CARDIOVERSION N/A 01/30/2017  ? Procedure: TRANSESOPHAGEAL ECHOCARDIOGRAM (TEE);  Surgeon: Gaye Pollack, MD;  Location: [place];  Service: Open Heart Surgery;  Laterality: N/A;  ? ULTRASOUND GUIDANCE FOR VASCULAR ACCESS  01/23/2017  ? Procedure: Ultrasound Guidance  For Vascular Access;  Surgeon: Belva Crome, MD;  Location: [place] [facility];  Service: Cardiovascular;;  ? ?Patient Active Problem List  ? Diagnosis Date Noted  ? Vitamin D deficiency 06/17/2021  ? Need for shingles vaccine 04/08/2021  ? Encounter for general adult medical examination with abnormal findings 04/08/2021  ? Chronic heart failure with preserved ejection fraction ([place]) 04/03/2021  ? Hyperlipidemia LDL goal <70 04/03/2021  ? Screen for colon cancer 04/03/2021  ? Systemic lupus erythematosus ([place]) 02/25/2021  ? Stage 3a chronic kidney disease ([place])   ? Hypertension 08/08/2020  ? Chronic idiopathic constipation 05/31/2020  ? Morbid obesity ([place]) 05/31/2020  ? Cervical cancer  screening 05/27/2017  ? Visit for screening mammogram 05/27/2017  ? Routine general medical examination at a health care facility 02/18/2017  ? Postoperative atrial fibrillation ([place]) 02/18/2017  ? Osteoarthritis of spine with radiculopathy, lumbar region 06/24/2016  ? Hypothyroidism 04/08/2011  ? Coronary artery disease involving coronary bypass graft of native heart with angina pectoris ([place]) 02/26/2009  ? GERD without esophagitis 08/05/2007  ? Systemic lupus erythematosus with focal and segmental proliferative glomerulonephritis ([place]) 08/05/2007  ? ?REFERRING DIAG: M54.9 (ICD-10-CM) - Upper back pain on right side ?  ?THERAPY DIAG:  ?Pain in thoracic spine ?  ?Chronic bilateral low back pain without sciatica ?  ?Cramp and spasm ?  ?Muscle weakness (generalized) ?  ?ONSET DATE: chronic; >2 years  ?  ?SUBJECTIVE:                                                                                                                                                                                          ?  ?SUBJECTIVE STATEMENT: ?I'm hurting today, I sat at my desk for a long time. ? ?PAIN:  ?Are you having pain? Yes 5/10 ?Upper thoracic spine, R side ? ?PERTINENT HISTORY:  ?Recent hospitalization 12/5-12/7/22 for acute CHF ?Systemic Lupus Erythematosus ?CAD s/p MI 2003 ?S/p CABG 2018 ?Atrial fibrillation ?See PMH above  ? ?  ?PRECAUTIONS: None ?  ?WEIGHT BEARING RESTRICTIONS No ?  ?OBJECTIVE:  ? *Unless otherwise noted all objective measures were captured on initial evaluation.  ? ?DIAGNOSTIC FINDINGS:  ?Reports 3 spinal MRI in the past year with patient reporting findings of arthritis. Imaging is not on file.  ?  ?PATIENT SURVEYS:  ?FOTO 40% function to 51% predicted ?05/16/21: 46% function  ?05/23/21: 46% function  ?06/20/2021: 44 % function ?          ?POSTURE:  ?Forward head, rounded shoulders  ?  ?PALPATION: ?TTP Rt thoracic paraspinals with spasm occurring with palpation.  ?05/23/21: TTP Rt thoracic paraspinals  ?   ?  LUMBARAROM/PROM ?  ?A/PROM A/PROM  ?04/04/2021 05/23/21  ?Flexion WNL minor LBP WNL   ?Extension WNL; pulling along back WNL; pain mid/low back   ?Right lateral flexion WNL; pulling along back WNL  ?Left lateral flexion WNL WNL  ?Right rotation WNL WNL  ?Left rotation WNL; pain along Rt upper thoracic  WNL  ? (Blank rows = not tested) ?  ?LE MMT: ?  ?MMT Right ?04/04/2021 Left ?04/04/2021 05/23/21  ?Hip flexion 4+ 5 5/5 bilateral   ?Hip extension       ?Hip abduction 3+ 4- 4-/5 bilateral   ?Hip adduction       ?Hip internal rotation       ?Hip external rotation       ?Knee flexion       ?Knee extension       ?Ankle dorsiflexion       ?Ankle plantarflexion       ?Ankle inversion       ?Ankle eversion       ? (Blank rows = not tested) ?  ?  ?UPPER EXTREMITY AROM/PROM: Shoulder AROM WNL bilaterally with patient reporting discomfort in the midback with functional ER on the RUE. 05/23/21: Bilateral shoulder AROM WNL, pain free  ?  ?(Blank rows = not tested) ?  ?UPPER EXTREMITY MMT: Rt mid thoracic pain with all MMT on the RUE; 05/23/21: no pain with shoulder MMT ?  ?MMT Right ?04/04/2021 Left ?04/04/2021 04/18/21 04/25/21 05/02/21 05/23/21  ?Shoulder flexion 4 5 Rt: 4+/5 Lt 5/5    5/5 bilaterally  ?Shoulder extension          ?Shoulder abduction 4 5   5/5 bilaterally pain free  5/5 bilaterally   ?Shoulder adduction          ?Middle trapezius 4- 4-  Lt: 4+/5 Rt: 4/5    Lt: 4+/5 Rt: 4/5   ?Lower trapezius          ?Shoulder ER 4 5    5/5 bilaterally  ?Shoulder IR 4 5    5/5 bilaterally   ?Wrist flexion          ?Wrist extension          ?Wrist ulnar deviation          ?Wrist radial deviation          ?Wrist pronation          ?Wrist supination          ?Grip strength (lbs)          ?(Blank rows = not tested) ?  ?  ?JOINT MOBILITY TESTING:  ?Pain before restriction thoracic spine PAIVM ?05/23/21: T-spine mobility WNL, mild pain with upper T-spine PAIVM ?  ? ?LUMBAR SPECIAL TESTS:  ?(-) SLR  ?  ?  ?  ?TODAY'S TREATMENT  ?[facility] Adult PT  Treatment:                                                DATE: 06/27/2021 ?Therapeutic Exercise: ?Nustep level 6 x 5 mins while gathering subjective ?Standing hip abduction 2 x 10 BIL ?Standing hip extension 2 x 10 BIL ?Standing

## 2021-07-04 ENCOUNTER — Ambulatory Visit: Payer: BC Managed Care – PPO

## 2021-07-04 DIAGNOSIS — M6281 Muscle weakness (generalized): Secondary | ICD-10-CM | POA: Diagnosis not present

## 2021-07-04 DIAGNOSIS — R252 Cramp and spasm: Secondary | ICD-10-CM

## 2021-07-04 DIAGNOSIS — M546 Pain in thoracic spine: Secondary | ICD-10-CM

## 2021-07-04 DIAGNOSIS — G8929 Other chronic pain: Secondary | ICD-10-CM | POA: Diagnosis not present

## 2021-07-04 DIAGNOSIS — M545 Low back pain, unspecified: Secondary | ICD-10-CM | POA: Diagnosis not present

## 2021-07-04 NOTE — Therapy (Addendum)
?OUTPATIENT PHYSICAL THERAPY TREATMENT NOTE/ RE-CERTIFICATION ? ? ?Patient Name: [name] ?MRN: [id number] ?DOB:Jul 27, 1955, 66 y.o., female ?Today's Date: 07/04/2021 ? ?PCP: Janith Lima, MD ?REFERRING PROVIDER: Florencia Reasons, MD ? ? PT End of Session - 07/04/21 1847   ? ? Visit Number 12   ? Number of Visits 16   ? Date for PT Re-Evaluation 08/08/21   ? Authorization Type BCBS   ? PT Start Time 1828   ? PT Stop Time 1914   8 minutes moist heat pack  ? PT Time Calculation (min) 46 min   ? Activity Tolerance Patient tolerated treatment well   ? Behavior During Therapy [facility] for tasks assessed/performed   ? ?  ?  ? ?  ? ? ? ? ? ? ? ? ? ? ?Past Medical History:  ?Diagnosis Date  ? Back pain   ? Bilateral swelling of feet and ankles   ? CAD (coronary artery disease)   ? a. remote stents/angioplasty. b. s/p CABG 01/2017 with mitral valve annuloplasty.  ? Chest pain   ? Constipation   ? Diastolic heart failure ([place])   ? Esophageal reflux   ? Gallbladder problem   ? GERD (gastroesophageal reflux disease)   ? History of MI (myocardial infarction)   ? Hyperlipidemia   ? Hypertension   ? Hypothyroidism   ? Immune thrombocytopenic purpura ([place])   ? Joint pain   ? Kidney problem   ? Lupus erythematosus   ? Mitral valve insufficiency and aortic valve insufficiency   ? Myocardial infarction ([place])   ? x 2  ? Other fatigue   ? PONV (postoperative nausea and vomiting)   ? Postoperative atrial fibrillation (H[facility])   ? Renal disease   ? Severe mitral regurgitation   ? a. s/p MV annuloplasty 01/2017 at time of CABG.  ? Shortness of breath on exertion   ? Sleep apnea   ? Status post mitral valve annuloplasty   ? Unspecified disease of pericardium   ? ?Past Surgical History:  ?Procedure Laterality Date  ? CHOLECYSTECTOMY, LAPAROSCOPIC  2010  ? CORONARY ARTERY BYPASS GRAFT N/A 01/30/2017  ? Procedure: CORONARY ARTERY BYPASS GRAFTING (CABG), Times four  using the right saphaneous vein, harvested endoscopicly.  and left internal mammary  artery .;  Surgeon: Gaye Pollack, MD;  [facility];  Service: Open Heart Surgery;  Laterality: N/A;  ? LEFT HEART CATH AND CORONARY ANGIOGRAPHY N/A 01/23/2017  ? Procedure: LEFT HEART CATH AND CORONARY ANGIOGRAPHY;  Surgeon: Belva Crome, MD;  Location: [place] [facility];  Service: Cardiovascular;  Laterality: N/A;  ? MITRAL VALVE REPAIR N/A 01/30/2017  ? Procedure: MITRAL VALVE REPAIR (MVR);  Surgeon: Gaye Pollack, MD;  Location: [place];  Service: Open Heart Surgery;  Laterality: N/A;  ? plastic surgical repair     ? dog bite to leg  ? RIGHT/LEFT HEART CATH AND CORONARY/GRAFT ANGIOGRAPHY N/A 02/26/2021  ? Procedure: RIGHT/LEFT HEART CATH AND CORONARY/GRAFT ANGIOGRAPHY;  Surgeon: Belva Crome, MD;  Location: [place] [facility];  Service: Cardiovascular;  Laterality: N/A;  ? SPLENECTOMY  5-04  ? TEE WITHOUT CARDIOVERSION N/A 01/27/2017  ? Procedure: TRANSESOPHAGEAL ECHOCARDIOGRAM (TEE);  Surgeon: Sanda Klein, MD;  Location: [place];  Service: Cardiovascular;  Laterality: N/A;  ? TEE WITHOUT CARDIOVERSION N/A 01/30/2017  ? Procedure: TRANSESOPHAGEAL ECHOCARDIOGRAM (TEE);  Surgeon: Gaye Pollack, MD;  Location: [place];  Service: Open Heart Surgery;  Laterality: N/A;  ? ULTRASOUND GUIDANCE FOR VASCULAR  ACCESS  01/23/2017  ? Procedure: Ultrasound Guidance For Vascular Access;  Surgeon: Belva Crome, MD;  Location: [place] [facility];  Service: Cardiovascular;;  ? ?Patient Active Problem List  ? Diagnosis Date Noted  ? Vitamin D deficiency 06/17/2021  ? Need for shingles vaccine 04/08/2021  ? Encounter for general adult medical examination with abnormal findings 04/08/2021  ? Chronic heart failure with preserved ejection fraction ([place]) 04/03/2021  ? Hyperlipidemia LDL goal <70 04/03/2021  ? Screen for colon cancer 04/03/2021  ? Systemic lupus erythematosus ([place]) 02/25/2021  ? Stage 3a chronic kidney disease ([place])   ? Hypertension 08/08/2020  ? Chronic idiopathic constipation 05/31/2020  ? Morbid  obesity ([place]) 05/31/2020  ? Cervical cancer screening 05/27/2017  ? Visit for screening mammogram 05/27/2017  ? Routine general medical examination at a health care facility 02/18/2017  ? Postoperative atrial fibrillation ([place]) 02/18/2017  ? Osteoarthritis of spine with radiculopathy, lumbar region 06/24/2016  ? Hypothyroidism 04/08/2011  ? Coronary artery disease involving coronary bypass graft of native heart with angina pectoris ([place]) 02/26/2009  ? GERD without esophagitis 08/05/2007  ? Systemic lupus erythematosus with focal and segmental proliferative glomerulonephritis ([place]) 08/05/2007  ? ?REFERRING DIAG: M54.9 (ICD-10-CM) - Upper back pain on right side ?  ?THERAPY DIAG:  ?Pain in thoracic spine ?  ?Chronic bilateral low back pain without sciatica ?  ?Cramp and spasm ?  ?Muscle weakness (generalized) ?  ?ONSET DATE: chronic; >2 years  ?  ?SUBJECTIVE:                                                                                                                                                                                          ?  ?SUBJECTIVE STATEMENT: ?Pt reports that she felt like she was making good progress with PT until yesterday, when her pain increased "out of nowhere." She reports no change in activity, and that her Rt thoracic region is very painful.  ? ?PAIN:  ?Are you having pain? Yes 8/10 ?Upper thoracic spine, R side ? ?PERTINENT HISTORY:  ?Recent hospitalization 12/5-12/7/22 for acute CHF ?Systemic Lupus Erythematosus ?CAD s/p MI 2003 ?S/p CABG 2018 ?Atrial fibrillation ?See PMH above  ? ?  ?PRECAUTIONS: None ?  ?WEIGHT BEARING RESTRICTIONS No ?  ?OBJECTIVE:  ? *Unless otherwise noted all objective measures were captured on initial evaluation.  ? ?DIAGNOSTIC FINDINGS:  ?Reports 3 spinal MRI in the past year with patient reporting findings of arthritis. Imaging is not on file.  ?  ?PATIENT SURVEYS:  ?FOTO 40% function to 51% predicted ?05/16/21: 46% function  ?05/23/21: 46% function   ?06/20/2021: 44 % function ?          ?  POSTURE:  ?Forward head, rounded shoulders  ?  ?PALPATION: ?TTP Rt thoracic paraspinals with spasm occurring with palpation.  ?05/23/21: TTP Rt thoracic paraspinals  ?  ?LUMBARAROM/PROM ?  ?A/PROM A/PROM  ?04/04/2021 05/23/21  ?Flexion WNL minor LBP WNL   ?Extension WNL; pulling along back WNL; pain mid/low back   ?Right lateral flexion WNL; pulling along back WNL  ?Left lateral flexion WNL WNL  ?Right rotation WNL WNL  ?Left rotation WNL; pain along Rt upper thoracic  WNL  ? (Blank rows = not tested) ?  ?LE MMT: ?  ?MMT Right ?04/04/2021 Left ?04/04/2021 05/23/21  ?Hip flexion 4+ 5 5/5 bilateral   ?Hip extension       ?Hip abduction 3+ 4- 4-/5 bilateral   ?Hip adduction       ?Hip internal rotation       ?Hip external rotation       ?Knee flexion       ?Knee extension       ?Ankle dorsiflexion       ?Ankle plantarflexion       ?Ankle inversion       ?Ankle eversion       ? (Blank rows = not tested) ?  ?  ?UPPER EXTREMITY AROM/PROM: Shoulder AROM WNL bilaterally with patient reporting discomfort in the midback with functional ER on the RUE. 05/23/21: Bilateral shoulder AROM WNL, pain free  ?  ?(Blank rows = not tested) ?  ?UPPER EXTREMITY MMT: Rt mid thoracic pain with all MMT on the RUE; 05/23/21: no pain with shoulder MMT ?  ?MMT Right ?04/04/2021 Left ?04/04/2021 04/18/21 04/25/21 05/02/21 05/23/21 07/04/2021  ?Shoulder flexion 4 5 Rt: 4+/5 Lt 5/5    5/5 bilaterally 5/5 bilaterally  ?Shoulder extension           ?Shoulder abduction 4 5   5/5 bilaterally pain free  5/5 bilaterally  5/5 bilaterally  ?Shoulder adduction           ?Middle trapezius 4- 4-  Lt: 4+/5 Rt: 4/5    Lt: 4+/5 Rt: 4/5  4+/5 BIL, p! on Rt  ?Lower trapezius           ?Shoulder ER 4 5    5/5 bilaterally 5/5 bilaterally  ?Shoulder IR 4 5    5/5 bilaterally  5/5 bilaterally  ?Wrist flexion           ?Wrist extension           ?Wrist ulnar deviation           ?Wrist radial deviation           ?Wrist pronation           ?Wrist  supination           ?Grip strength (lbs)           ?(Blank rows = not tested) ?  ?  ?JOINT MOBILITY TESTING:  ?Pain before restriction thoracic spine PAIVM ?05/23/21: T-spine mobility WNL, mild pain with upper T-spine PAIVM ?

## 2021-07-08 ENCOUNTER — Ambulatory Visit (INDEPENDENT_AMBULATORY_CARE_PROVIDER_SITE_OTHER): Payer: BC Managed Care – PPO | Admitting: Advanced Practice Midwife

## 2021-07-08 ENCOUNTER — Encounter (INDEPENDENT_AMBULATORY_CARE_PROVIDER_SITE_OTHER): Payer: Self-pay | Admitting: Family Medicine

## 2021-07-08 ENCOUNTER — Encounter: Payer: Self-pay | Admitting: Advanced Practice Midwife

## 2021-07-08 ENCOUNTER — Other Ambulatory Visit (HOSPITAL_COMMUNITY)
Admission: RE | Admit: 2021-07-08 | Discharge: 2021-07-08 | Disposition: A | Discharge status: Home / Self Care | Payer: BC Managed Care – PPO | Source: Ambulatory Visit | Attending: Advanced Practice Midwife | Admitting: Advanced Practice Midwife

## 2021-07-08 ENCOUNTER — Ambulatory Visit (INDEPENDENT_AMBULATORY_CARE_PROVIDER_SITE_OTHER): Payer: BC Managed Care – PPO | Admitting: Family Medicine

## 2021-07-08 VITALS — BP 115/73 | HR 69 | Ht 67.0 in | Wt 248.2 lb

## 2021-07-08 VITALS — BP 96/67 | HR 68 | Ht 67.0 in | Wt 247.0 lb

## 2021-07-08 DIAGNOSIS — Z6841 Body Mass Index (BMI) 40.0 and over, adult: Secondary | ICD-10-CM | POA: Diagnosis not present

## 2021-07-08 DIAGNOSIS — Z9189 Other specified personal risk factors, not elsewhere classified: Secondary | ICD-10-CM

## 2021-07-08 DIAGNOSIS — E559 Vitamin D deficiency, unspecified: Secondary | ICD-10-CM | POA: Diagnosis not present

## 2021-07-08 DIAGNOSIS — Z124 Encounter for screening for malignant neoplasm of cervix: Secondary | ICD-10-CM

## 2021-07-08 DIAGNOSIS — Z01419 Encounter for gynecological examination (general) (routine) without abnormal findings: Secondary | ICD-10-CM

## 2021-07-08 DIAGNOSIS — Z1151 Encounter for screening for human papillomavirus (HPV): Secondary | ICD-10-CM | POA: Diagnosis not present

## 2021-07-08 DIAGNOSIS — E669 Obesity, unspecified: Secondary | ICD-10-CM | POA: Diagnosis not present

## 2021-07-08 MED ORDER — VITAMIN D (ERGOCALCIFEROL) 1.25 MG (50000 UNIT) PO CAPS: 50000.0000 [IU] | ORAL_CAPSULE | ORAL | 0 refills | Status: DC

## 2021-07-08 NOTE — Progress Notes (Signed)
? ?  Subjective:  ?  ? [name] is a 66 y.o. female here at [facility] for a routine exam.  Her hx is significant for SLE with stage 3a CKD, HTN, hypothyroidism, and morbid obesity.  She has PCP and specialty care and follows up as recommended. ?Current complaints: none.  Not sexually active since 2000.  Last menses 2006.  She may get remarried and become sexually active so has questions about Pap recommendations, intercourse postmenopause, etc.  Personal health questionnaire reviewed: yes. ? ?Do you have a primary care provider? Yes and specialist care ?Do you feel safe at home? yes ? ?Flowsheet Row Clinical Support from 06/10/2021 in [place] at [place]  ?PHQ-2 Total Score 0  ? ?  ? ? ?Health Maintenance Due  ?Topic Date Due  ? Zoster Vaccines- Shingrix (1 of 2) Never done  ? COVID-19 Vaccine (5 - Booster for Moderna series) 05/26/2020  ? PAP SMEAR-Modifier  06/16/2020  ? COLONOSCOPY (Pts 45-52yr Insurance coverage will need to be confirmed)  09/19/2020  ? TETANUS/TDAP  05/21/2021  ?  ? ?Risk factors for chronic health problems: ?Smoking: ?Alchohol/how much: ?Pt BMI: There is no height or weight on file to calculate BMI. ?  ?Gynecologic History ?No LMP recorded. Patient is postmenopausal. ?Contraception: post menopausal status ?Last Pap: 2019. Results were: normal  ?Last mammogram: 01/22/21. Results were: normal ? ?Obstetric History ?OB History  ?Gravida Para Term Preterm AB Living  ?'2 2       2  '$ ?SAB IAB Ectopic Multiple Live Births  ?           ?  ?# Outcome Date GA Lbr Len/2nd Weight Sex Delivery Anes PTL Lv  ?2 Para 1975          ?1[place]         ? ? ? ?The following portions of the patient's history were reviewed and updated as appropriate: allergies, current medications, past family history, past medical history, past social history, past surgical history, and problem list. ? ?Review of Systems ?Pertinent items noted in HPI and remainder of comprehensive ROS otherwise negative.  ?   ?Objective:  ? ?There were no vitals taken for this visit. ?VS reviewed, nursing note reviewed,  ?Constitutional: well developed, well nourished, no distress ?HEENT: normocephalic ?CV: normal rate ?Pulm/chest wall: normal effort ?Breast Exam:  exam performed: right breast normal without mass, skin or nipple changes or axillary nodes, left breast normal without mass, skin or nipple changes or axillary nodes ?Abdomen: soft ?Neuro: alert and oriented x 3 ?Skin: warm, dry ?Psych: affect normal ?Pelvic exam: Performed: Cervix pink, visually closed, without lesion, scant white creamy discharge, vaginal walls and external genitalia normal ?Bimanual exam: Cervix 0/long/high, firm, anterior, neg CMT, uterus nontender, nonenlarged, adnexa without tenderness, enlargement, or mass  ? ? ?   ?Assessment/Plan:  ? ?1. Encounter for screening for cervical cancer ?--Pap today with hpv ? ?2. Well woman exam with routine gynecological exam ?--No gyn concerns ?--Pt to try lubrication first if she becomes sexually active, may consider topical estrogen too if needed. ?--No more Paps if this Pap is normal, per ASCCP guidelines, but pt can f/u with [place] for breast exam, or to discuss vaginal atrophy treatments PRN. ? ? ? ?No follow-ups on file.  ? ?LFatima Blank CNM ?8:06 AM   ?

## 2021-07-08 NOTE — Progress Notes (Signed)
New GYN pt in office for annual exam. She does not have any concerns today.  ?

## 2021-07-09 DIAGNOSIS — K5904 Chronic idiopathic constipation: Secondary | ICD-10-CM | POA: Diagnosis not present

## 2021-07-09 DIAGNOSIS — K219 Gastro-esophageal reflux disease without esophagitis: Secondary | ICD-10-CM | POA: Diagnosis not present

## 2021-07-09 DIAGNOSIS — K449 Diaphragmatic hernia without obstruction or gangrene: Secondary | ICD-10-CM | POA: Diagnosis not present

## 2021-07-09 LAB — CYTOLOGY - PAP
Comment: NEGATIVE
Diagnosis: NEGATIVE
High risk HPV: NEGATIVE

## 2021-07-10 ENCOUNTER — Other Ambulatory Visit: Payer: Self-pay | Admitting: Internal Medicine

## 2021-07-11 DIAGNOSIS — Z20822 Contact with and (suspected) exposure to covid-19: Secondary | ICD-10-CM | POA: Diagnosis not present

## 2021-07-13 ENCOUNTER — Telehealth: Payer: Self-pay | Admitting: Advanced Practice Midwife

## 2021-07-13 NOTE — Telephone Encounter (Signed)
Called pt to discuss normal Pap results.  Pt age 66, normal Pap hx and normal Pap on 07/08/2021.  No more Pap tests needed per ASCCP guidelines. Pt to f/u with [facility] for Gyn concerns, and/or follow up with PCP.  Pt states understanding.  ?

## 2021-07-18 ENCOUNTER — Ambulatory Visit: Payer: BC Managed Care – PPO | Admitting: Physical Therapy

## 2021-07-18 ENCOUNTER — Encounter: Payer: Self-pay | Admitting: Physical Therapy

## 2021-07-18 DIAGNOSIS — M545 Low back pain, unspecified: Secondary | ICD-10-CM

## 2021-07-18 DIAGNOSIS — R252 Cramp and spasm: Secondary | ICD-10-CM | POA: Diagnosis not present

## 2021-07-18 DIAGNOSIS — G8929 Other chronic pain: Secondary | ICD-10-CM | POA: Diagnosis not present

## 2021-07-18 DIAGNOSIS — M546 Pain in thoracic spine: Secondary | ICD-10-CM | POA: Diagnosis not present

## 2021-07-18 DIAGNOSIS — M6281 Muscle weakness (generalized): Secondary | ICD-10-CM | POA: Diagnosis not present

## 2021-07-18 NOTE — Therapy (Signed)
?OUTPATIENT PHYSICAL THERAPY TREATMENT NOTE/ RE-CERTIFICATION ? ? ?Patient Name: [name] ?MRN: [id number] ?DOB:04/03/1955, 66 y.o., female ?Today's Date: 07/19/2021 ? ?PCP: Janith Lima, MD ?REFERRING PROVIDER: Janith Lima, MD ? ? PT End of Session - 07/18/21 1829   ? ? Visit Number 13   ? Number of Visits 16   ? Date for PT Re-Evaluation 08/08/21   ? Authorization Type BCBS   ? PT Start Time 6433   ? PT Stop Time 1910   ? PT Time Calculation (min) 40 min   ? Activity Tolerance Patient tolerated treatment well   ? Behavior During Therapy [facility] for tasks assessed/performed   ? ?  ?  ? ?  ? ? ? ? ? ? ? ? ? ? ?Past Medical History:  ?Diagnosis Date  ? Back pain   ? Bilateral swelling of feet and ankles   ? CAD (coronary artery disease)   ? a. remote stents/angioplasty. b. s/p CABG 01/2017 with mitral valve annuloplasty.  ? Chest pain   ? Constipation   ? Diastolic heart failure ([place])   ? Esophageal reflux   ? Gallbladder problem   ? GERD (gastroesophageal reflux disease)   ? History of MI (myocardial infarction)   ? Hyperlipidemia   ? Hypertension   ? Hypothyroidism   ? Immune thrombocytopenic purpura ([place])   ? Joint pain   ? Kidney problem   ? Lupus erythematosus   ? Mitral valve insufficiency and aortic valve insufficiency   ? Myocardial infarction ([place])   ? x 2  ? Other fatigue   ? PONV (postoperative nausea and vomiting)   ? Postoperative atrial fibrillation ([facility])   ? Renal disease   ? Severe mitral regurgitation   ? a. s/p MV annuloplasty 01/2017 at time of CABG.  ? Shortness of breath on exertion   ? Sleep apnea   ? Status post mitral valve annuloplasty   ? Unspecified disease of pericardium   ? ?Past Surgical History:  ?Procedure Laterality Date  ? CHOLECYSTECTOMY, LAPAROSCOPIC  2010  ? CORONARY ARTERY BYPASS GRAFT N/A 01/30/2017  ? Procedure: CORONARY ARTERY BYPASS GRAFTING (CABG), Times four  using the right saphaneous vein, harvested endoscopicly.  and left internal mammary artery .;  Surgeon:  Gaye Pollack, MD;  [facility]: [facility];  Service: Open Heart Surgery;  Laterality: N/A;  ? LEFT HEART CATH AND CORONARY ANGIOGRAPHY N/A 01/23/2017  ? Procedure: LEFT HEART CATH AND CORONARY ANGIOGRAPHY;  Surgeon: Belva Crome, MD;  Location: [place] [facility];  Service: Cardiovascular;  Laterality: N/A;  ? MITRAL VALVE REPAIR N/A 01/30/2017  ? Procedure: MITRAL VALVE REPAIR (MVR);  Surgeon: Gaye Pollack, MD;  Location: [place];  Service: Open Heart Surgery;  Laterality: N/A;  ? plastic surgical repair     ? dog bite to leg  ? RIGHT/LEFT HEART CATH AND CORONARY/GRAFT ANGIOGRAPHY N/A 02/26/2021  ? Procedure: RIGHT/LEFT HEART CATH AND CORONARY/GRAFT ANGIOGRAPHY;  Surgeon: Belva Crome, MD;  Location: [place] [facility];  Service: Cardiovascular;  Laterality: N/A;  ? SPLENECTOMY  5-04  ? TEE WITHOUT CARDIOVERSION N/A 01/27/2017  ? Procedure: TRANSESOPHAGEAL ECHOCARDIOGRAM (TEE);  Surgeon: Sanda Klein, MD;  Location: [place];  Service: Cardiovascular;  Laterality: N/A;  ? TEE WITHOUT CARDIOVERSION N/A 01/30/2017  ? Procedure: TRANSESOPHAGEAL ECHOCARDIOGRAM (TEE);  Surgeon: Gaye Pollack, MD;  Location: [place];  Service: Open Heart Surgery;  Laterality: N/A;  ? ULTRASOUND GUIDANCE FOR VASCULAR ACCESS  01/23/2017  ?  Procedure: Ultrasound Guidance For Vascular Access;  Surgeon: Belva Crome, MD;  Location: [place] [facility];  Service: Cardiovascular;;  ? ?Patient Active Problem List  ? Diagnosis Date Noted  ? Vitamin D deficiency 06/17/2021  ? Need for shingles vaccine 04/08/2021  ? Encounter for general adult medical examination with abnormal findings 04/08/2021  ? Chronic heart failure with preserved ejection fraction ([place]) 04/03/2021  ? Hyperlipidemia LDL goal <70 04/03/2021  ? Screen for colon cancer 04/03/2021  ? Systemic lupus erythematosus ([place]) 02/25/2021  ? Stage 3a chronic kidney disease ([place])   ? Hypertension 08/08/2020  ? Chronic idiopathic constipation 05/31/2020  ? Morbid obesity ([place]) 05/31/2020   ? Postoperative atrial fibrillation ([place]) 02/18/2017  ? Osteoarthritis of spine with radiculopathy, lumbar region 06/24/2016  ? Hypothyroidism 04/08/2011  ? Coronary artery disease involving coronary bypass graft of native heart with angina pectoris ([place]) 02/26/2009  ? GERD without esophagitis 08/05/2007  ? Systemic lupus erythematosus with focal and segmental proliferative glomerulonephritis ([place]) 08/05/2007  ? ?REFERRING DIAG: M54.9 (ICD-10-CM) - Upper back pain on right side ?  ?THERAPY DIAG:  ?Pain in thoracic spine ?  ?Chronic bilateral low back pain without sciatica ?  ?Cramp and spasm ?  ?Muscle weakness (generalized) ?  ?ONSET DATE: chronic; >2 years  ?  ?SUBJECTIVE:                                                                                                                                                                                          ?  ?SUBJECTIVE STATEMENT: ?Pt report that she is feeling ok today with minimal pain. ? ?PAIN:  ?Are you having pain? Yes 3/10 ?Upper thoracic spine, R side ? ?PERTINENT HISTORY:  ?Recent hospitalization 12/5-12/7/22 for acute CHF ?Systemic Lupus Erythematosus ?CAD s/p MI 2003 ?S/p CABG 2018 ?Atrial fibrillation ?See PMH above  ? ?  ?PRECAUTIONS: None ?  ?WEIGHT BEARING RESTRICTIONS No ?  ?OBJECTIVE:  ? *Unless otherwise noted all objective measures were captured on initial evaluation.  ? ?DIAGNOSTIC FINDINGS:  ?Reports 3 spinal MRI in the past year with patient reporting findings of arthritis. Imaging is not on file.  ?  ?PATIENT SURVEYS:  ?FOTO 40% function to 51% predicted ?05/16/21: 46% function  ?05/23/21: 46% function  ?06/20/2021: 44 % function ?          ?POSTURE:  ?Forward head, rounded shoulders  ?  ?PALPATION: ?TTP Rt thoracic paraspinals with spasm occurring with palpation.  ?05/23/21: TTP Rt thoracic paraspinals  ?  ?LUMBARAROM/PROM ?  ?A/PROM A/PROM  ?04/04/2021 05/23/21  ?Flexion WNL minor LBP WNL   ?Extension WNL; pulling along back WNL; pain  mid/low back    ?Right lateral flexion WNL; pulling along back WNL  ?Left lateral flexion WNL WNL  ?Right rotation WNL WNL  ?Left rotation WNL; pain along Rt upper thoracic  WNL  ? (Blank rows = not tested) ?  ?LE MMT: ?  ?MMT Right ?04/04/2021 Left ?04/04/2021 05/23/21  ?Hip flexion 4+ 5 5/5 bilateral   ?Hip extension       ?Hip abduction 3+ 4- 4-/5 bilateral   ?Hip adduction       ?Hip internal rotation       ?Hip external rotation       ?Knee flexion       ?Knee extension       ?Ankle dorsiflexion       ?Ankle plantarflexion       ?Ankle inversion       ?Ankle eversion       ? (Blank rows = not tested) ?  ?  ?UPPER EXTREMITY AROM/PROM: Shoulder AROM WNL bilaterally with patient reporting discomfort in the midback with functional ER on the RUE. 05/23/21: Bilateral shoulder AROM WNL, pain free  ?  ?(Blank rows = not tested) ?  ?UPPER EXTREMITY MMT: Rt mid thoracic pain with all MMT on the RUE; 05/23/21: no pain with shoulder MMT ?  ?MMT Right ?04/04/2021 Left ?04/04/2021 04/18/21 04/25/21 05/02/21 05/23/21 07/04/2021  ?Shoulder flexion 4 5 Rt: 4+/5 Lt 5/5    5/5 bilaterally 5/5 bilaterally  ?Shoulder extension           ?Shoulder abduction 4 5   5/5 bilaterally pain free  5/5 bilaterally  5/5 bilaterally  ?Shoulder adduction           ?Middle trapezius 4- 4-  Lt: 4+/5 Rt: 4/5    Lt: 4+/5 Rt: 4/5  4+/5 BIL, p! on Rt  ?Lower trapezius           ?Shoulder ER 4 5    5/5 bilaterally 5/5 bilaterally  ?Shoulder IR 4 5    5/5 bilaterally  5/5 bilaterally  ?Wrist flexion           ?Wrist extension           ?Wrist ulnar deviation           ?Wrist radial deviation           ?Wrist pronation           ?Wrist supination           ?Grip strength (lbs)           ?(Blank rows = not tested) ?  ?  ?JOINT MOBILITY TESTING:  ?Pain before restriction thoracic spine PAIVM ?05/23/21: T-spine mobility WNL, mild pain with upper T-spine PAIVM ?  ? ?LUMBAR SPECIAL TESTS:  ?(-) SLR  ?  ?  ?  ?TODAY'S TREATMENT  ? ?[facility] Adult PT Treatment:                                                 DATE: 07/18/2021 ?Therapeutic Exercise: ?Standing 5# KB OH press - 2x10 ?Seated rows with 35# cable 2x10 ?Seated high rows with 35# cable 2x10 ?Seated lat pull-down with 35# cable 2x10 ? ?[place]

## 2021-07-19 NOTE — Progress Notes (Signed)
? ? ? ?Chief Complaint:  ? ?OBESITY ?[name] is here to discuss her progress with her obesity treatment plan along with follow-up of her obesity related diagnoses. [name] is on the Category 1 Plan with breakfast and lunch options and states she is following her eating plan approximately 100% of the time. [name] states she is walking for 25 minutes 2 times per week. ? ?Today's visit was #: 6 ?Starting weight: 258 lbs ?Starting date: 04/09/2021 ?Today's weight: 247 lbs ?Today's date: 07/08/2021 ?Total lbs lost to date: 11 ?Total lbs lost since last in-office visit: 6 ? ?Interim History: [name] had an easier time staying on the plan. She denies cravings or hunger when it is time to eat. She has been eating all of her protein. She is walking 2-3 days per week for 30 minutes. ? ?Subjective:  ? ?1. Vitamin D deficiency ?[name] is tolerating Ergocalciferol well without side effects.  Medication compliance is good as patient endorses taking it as prescribed.  The patient denies additional concerns regarding this condition.     ? ?2. At risk for activity intolerance ?[name] is at risk for exercise intolerance due to joint pain.  ? ?Assessment/Plan:  ?No orders of the defined types were placed in this encounter. ? ? ?Medications Discontinued During This Encounter  ?Medication Reason  ? Vitamin D, Ergocalciferol, (DRISDOL) 1.25 MG (50000 UNIT) CAPS capsule Reorder  ?  ? ?Meds ordered this encounter  ?Medications  ? Vitamin D, Ergocalciferol, (DRISDOL) 1.25 MG (50000 UNIT) CAPS capsule  ?  Sig: Take 1 capsule (50,000 Units total) by mouth every 7 (seven) days.  ?  Dispense:  4 capsule  ?  Refill:  0  ?  30 d supply;  ** OV for RF **   Do not send RF request  ?  ? ?1. Vitamin D deficiency ?[name] will continue prescription Vitamin D 50,000 IU every week, and we will refill for 1 month. She will follow-up for routine testing of Vitamin D, at least 2-3 times per year to avoid over-replacement. ? ?- Vitamin D,  Ergocalciferol, (DRISDOL) 1.25 MG (50000 UNIT) CAPS capsule; Take 1 capsule (50,000 Units total) by mouth every 7 (seven) days.  Dispense: 4 capsule; Refill: 0 ? ?2. At risk for activity intolerance ?[name] was given approximately 9 minutes of exercise intolerance counseling today. She is 66 y.o. female and has risk factors exercise intolerance including obesity. We discussed intensive lifestyle modifications today with an emphasis on specific weight loss instructions and strategies. Karlye will slowly increase activity as tolerated. ? ?Repetitive spaced learning was employed today to elicit superior memory formation and behavioral change. ? ?3. Obesity with current BMI of 40.0 ?[name] is currently in the action stage of change. As such, her goal is to continue with weight loss efforts. She has agreed to the Category 1 Plan with breakfast and lunch options.  ? ?Exercise goals: Increase to 30 minutes 3-5 days per week as tolerated.  ? ?Behavioral modification strategies: increasing lean protein intake, increasing water intake, avoiding temptations, and planning for success. ? ?[name] has agreed to follow-up with our clinic in 2 to 3 weeks. She was informed of the importance of frequent follow-up visits to maximize her success with intensive lifestyle modifications for her multiple health conditions.  ? ?Objective:  ? ?Blood pressure 96/67, pulse 68, height '5\' 7"'$  (1.702 m), weight 247 lb (112 kg), SpO2 98 %. ?Body mass index is 38.69 kg/m?. ? ?General: Cooperative, alert, well developed, in no acute  distress. ?HEENT: Conjunctivae and lids unremarkable. ?Cardiovascular: Regular rhythm.  ?Lungs: Normal work of breathing. ?Neurologic: No focal deficits.  ? ?Lab Results  ?Component Value Date  ? CREATININE 1.41 (H) 04/03/2021  ? BUN 26 (H) 04/03/2021  ? NA 138 04/03/2021  ? K 4.1 04/03/2021  ? CL 103 04/03/2021  ? CO2 29 04/03/2021  ? ?Lab Results  ?Component Value Date  ? ALT 12 02/26/2021  ? AST 20 02/26/2021  ? ALKPHOS  66 02/26/2021  ? BILITOT 0.7 02/26/2021  ? ?Lab Results  ?Component Value Date  ? HGBA1C 5.2 02/25/2021  ? HGBA1C 5.6 01/31/2017  ? ?Lab Results  ?Component Value Date  ? INSULIN 17.2 04/09/2021  ? ?Lab Results  ?Component Value Date  ? TSH 1.60 04/03/2021  ? ?Lab Results  ?Component Value Date  ? CHOL 110 02/25/2021  ? HDL 44 02/25/2021  ? Elizabeth 59 02/25/2021  ? LDLDIRECT 321.8 02/27/2012  ? TRIG 36 02/25/2021  ? CHOLHDL 2.5 02/25/2021  ? ?Lab Results  ?Component Value Date  ? VD25OH 17.2 (L) 04/09/2021  ? ?Lab Results  ?Component Value Date  ? WBC 3.4 (L) 02/26/2021  ? HGB 12.9 02/26/2021  ? HCT 38.0 02/26/2021  ? MCV 100.8 (H) 02/26/2021  ? PLT 160 02/26/2021  ? ?Lab Results  ?Component Value Date  ? IRON 386 06/23/2017  ? TIBC 285 06/23/2017  ? FERRITIN 58 06/23/2017  ? ?Attestation Statements:  ? ?Reviewed by clinician on day of visit: allergies, medications, problem list, medical history, surgical history, family history, social history, and previous encounter notes. ? ? ?I, Trixie Dredge, am acting as transcriptionist for [employer], DO. ? ?I have reviewed the above documentation for accuracy and completeness, and I agree with the above. Marjory Sneddon, D.O. ? ?The [place] was signed into law in 2016 which includes the topic of electronic health records.  This provides immediate access to information in MyChart.  This includes consultation notes, operative notes, office notes, lab results and pathology reports.  If you have any questions about what you read please let us know at your next visit so we can discuss your concerns and take corrective action if need be.  We are right here with you. ? ? ?

## 2021-07-25 ENCOUNTER — Ambulatory Visit: Payer: BC Managed Care – PPO

## 2021-07-25 NOTE — Therapy (Incomplete)
?OUTPATIENT PHYSICAL THERAPY TREATMENT NOTE/ RE-CERTIFICATION ? ? ?Patient Name: [name] ?MRN: [id number] ?DOB:1955/11/01, 66 y.o., female ?Today's Date: 07/25/2021 ? ?PCP: Janith Lima, MD ?REFERRING PROVIDER: Florencia Reasons, MD ? ? ? ? ? ? ? ? ? ? ? ? ?Past Medical History:  ?Diagnosis Date  ? Back pain   ? Bilateral swelling of feet and ankles   ? CAD (coronary artery disease)   ? a. remote stents/angioplasty. b. s/p CABG 01/2017 with mitral valve annuloplasty.  ? Chest pain   ? Constipation   ? Diastolic heart failure ([place])   ? Esophageal reflux   ? Gallbladder problem   ? GERD (gastroesophageal reflux disease)   ? History of MI (myocardial infarction)   ? Hyperlipidemia   ? Hypertension   ? Hypothyroidism   ? Immune thrombocytopenic purpura ([place])   ? Joint pain   ? Kidney problem   ? Lupus erythematosus   ? Mitral valve insufficiency and aortic valve insufficiency   ? Myocardial infarction ([place])   ? x 2  ? Other fatigue   ? PONV (postoperative nausea and vomiting)   ? Postoperative atrial fibrillation ([facility])   ? Renal disease   ? Severe mitral regurgitation   ? a. s/p MV annuloplasty 01/2017 at time of CABG.  ? Shortness of breath on exertion   ? Sleep apnea   ? Status post mitral valve annuloplasty   ? Unspecified disease of pericardium   ? ?Past Surgical History:  ?Procedure Laterality Date  ? CHOLECYSTECTOMY, LAPAROSCOPIC  2010  ? CORONARY ARTERY BYPASS GRAFT N/A 01/30/2017  ? Procedure: CORONARY ARTERY BYPASS GRAFTING (CABG), Times four  using the right saphaneous vein, harvested endoscopicly.  and left internal mammary artery .;  Surgeon: Gaye Pollack, MD;  Location: [facility];  Service: Open Heart Surgery;  Laterality: N/A;  ? LEFT HEART CATH AND CORONARY ANGIOGRAPHY N/A 01/23/2017  ? Procedure: LEFT HEART CATH AND CORONARY ANGIOGRAPHY;  Surgeon: Belva Crome, MD;  Location:  [facility];  Service: Cardiovascular;  Laterality: N/A;  ? MITRAL VALVE REPAIR N/A 01/30/2017  ? Procedure: MITRAL VALVE  REPAIR (MVR);  Surgeon: Gaye Pollack, MD;  Location: [facility];  Service: Open Heart Surgery;  Laterality: N/A;  ? plastic surgical repair     ? dog bite to leg  ? RIGHT/LEFT HEART CATH AND CORONARY/GRAFT ANGIOGRAPHY N/A 02/26/2021  ? Procedure: RIGHT/LEFT HEART CATH AND CORONARY/GRAFT ANGIOGRAPHY;  Surgeon: Belva Crome, MD;  Location: [facility];  Service: Cardiovascular;  Laterality: N/A;  ? SPLENECTOMY  5-04  ? TEE WITHOUT CARDIOVERSION N/A 01/27/2017  ? Procedure: TRANSESOPHAGEAL ECHOCARDIOGRAM (TEE);  Surgeon: Sanda Klein, MD;  Location: [facility];  Service: Cardiovascular;  Laterality: N/A;  ? TEE WITHOUT CARDIOVERSION N/A 01/30/2017  ? Procedure: TRANSESOPHAGEAL ECHOCARDIOGRAM (TEE);  Surgeon: Gaye Pollack, MD;  Location: [facility];  Service: Open Heart Surgery;  Laterality: N/A;  ? ULTRASOUND GUIDANCE FOR VASCULAR ACCESS  01/23/2017  ? Procedure: Ultrasound Guidance For Vascular Access;  Surgeon: Belva Crome, MD;  Location: [facility];  Service: Cardiovascular;;  ? ?Patient Active Problem List  ? Diagnosis Date Noted  ? Vitamin D deficiency 06/17/2021  ? Need for shingles vaccine 04/08/2021  ? Encounter for general adult medical examination with abnormal findings 04/08/2021  ? Chronic heart failure with preserved ejection fraction ([place]) 04/03/2021  ? Hyperlipidemia LDL goal <70 04/03/2021  ? Screen for colon cancer 04/03/2021  ? Systemic lupus erythematosus ([place])  02/25/2021  ? Stage 3a chronic kidney disease ([place])   ? Hypertension 08/08/2020  ? Chronic idiopathic constipation 05/31/2020  ? Morbid obesity ([place]) 05/31/2020  ? Postoperative atrial fibrillation ([place]) 02/18/2017  ? Osteoarthritis of spine with radiculopathy, lumbar region 06/24/2016  ? Hypothyroidism 04/08/2011  ? Coronary artery disease involving coronary bypass graft of native heart with angina pectoris ([place]) 02/26/2009  ? GERD without esophagitis 08/05/2007  ? Systemic lupus erythematosus with focal and segmental  proliferative glomerulonephritis ([place]) 08/05/2007  ? ?REFERRING DIAG: M54.9 (ICD-10-CM) - Upper back pain on right side ?  ?THERAPY DIAG:  ?Pain in thoracic spine ?  ?Chronic bilateral low back pain without sciatica ?  ?Cramp and spasm ?  ?Muscle weakness (generalized) ?  ?ONSET DATE: chronic; >2 years  ?  ?SUBJECTIVE:                                   ?  ?SUBJECTIVE STATEMENT: ?*** ? ?PAIN:  ?Are you having pain? Yes 3/10 ?Upper thoracic spine, R side ? ?PERTINENT HISTORY:  ?Recent hospitalization 12/5-12/7/22 for acute CHF ?Systemic Lupus Erythematosus ?CAD s/p MI 2003 ?S/p CABG 2018 ?Atrial fibrillation ?See PMH above  ? ?  ?PRECAUTIONS: None ?  ?WEIGHT BEARING RESTRICTIONS No ?  ?OBJECTIVE:  ? *Unless otherwise noted all objective measures were captured on initial evaluation.  ? ?DIAGNOSTIC FINDINGS:  ?Reports 3 spinal MRI in the past year with patient reporting findings of arthritis. Imaging is not on file.  ?  ?PATIENT SURVEYS:  ?FOTO 40% function to 51% predicted ?05/16/21: 46% function  ?05/23/21: 46% function  ?06/20/2021: 44 % function ?          ?POSTURE:  ?Forward head, rounded shoulders  ?  ?PALPATION: ?TTP Rt thoracic paraspinals with spasm occurring with palpation.  ?05/23/21: TTP Rt thoracic paraspinals  ?  ?LUMBARAROM/PROM ?  ?A/PROM A/PROM  ?04/04/2021 05/23/21  ?Flexion WNL minor LBP WNL   ?Extension WNL; pulling along back WNL; pain mid/low back   ?Right lateral flexion WNL; pulling along back WNL  ?Left lateral flexion WNL WNL  ?Right rotation WNL WNL  ?Left rotation WNL; pain along Rt upper thoracic  WNL  ? (Blank rows = not tested) ?  ?LE MMT: ?  ?MMT Right ?04/04/2021 Left ?04/04/2021 05/23/21  ?Hip flexion 4+ 5 5/5 bilateral   ?Hip extension       ?Hip abduction 3+ 4- 4-/5 bilateral   ?Hip adduction       ?Hip internal rotation       ?Hip external rotation       ?Knee flexion       ?Knee extension       ?Ankle dorsiflexion       ?Ankle plantarflexion       ?Ankle inversion       ?Ankle eversion       ?  (Blank rows = not tested) ?  ?  ?UPPER EXTREMITY AROM/PROM: Shoulder AROM WNL bilaterally with patient reporting discomfort in the midback with functional ER on the RUE. 05/23/21: Bilateral shoulder AROM WNL, pain free  ?  ?(Blank rows = not tested) ?  ?UPPER EXTREMITY MMT: Rt mid thoracic pain with all MMT on the RUE; 05/23/21: no pain with shoulder MMT ?  ?MMT Right ?04/04/2021 Left ?04/04/2021 04/18/21 04/25/21 05/02/21 05/23/21 07/04/2021  ?Shoulder flexion 4 5 Rt: 4+/5 Lt 5/5    5/5 bilaterally 5/5 bilaterally  ?Shoulder  extension           ?Shoulder abduction 4 5   5/5 bilaterally pain free  5/5 bilaterally  5/5 bilaterally  ?Shoulder adduction           ?Middle trapezius 4- 4-  Lt: 4+/5 Rt: 4/5    Lt: 4+/5 Rt: 4/5  4+/5 BIL, p! on Rt  ?Lower trapezius           ?Shoulder ER 4 5    5/5 bilaterally 5/5 bilaterally  ?Shoulder IR 4 5    5/5 bilaterally  5/5 bilaterally  ?Wrist flexion           ?Wrist extension           ?Wrist ulnar deviation           ?Wrist radial deviation           ?Wrist pronation           ?Wrist supination           ?Grip strength (lbs)           ?(Blank rows = not tested) ?  ?  ?JOINT MOBILITY TESTING:  ?Pain before restriction thoracic spine PAIVM ?05/23/21: T-spine mobility WNL, mild pain with upper T-spine PAIVM ?  ? ?LUMBAR SPECIAL TESTS:  ?(-) SLR  ?  ?  ?  ?TODAY'S TREATMENT  ? ?[facility] Adult PT Treatment:                                                DATE: 07/25/2021 ?Therapeutic Exercise: ?*** ?Manual Therapy: ?*** ?Neuromuscular re-ed: ?*** ?Therapeutic Activity: ?*** ?Modalities: ?*** ?Self Care: ?*** ? ? ?[facility] Adult PT Treatment:                                                DATE: 07/18/2021 ?Therapeutic Exercise: ?Standing 5# KB OH press - 2x10 ?Seated rows with 35# cable 2x10 ?Seated high rows with 35# cable 2x10 ?Seated lat pull-down with 35# cable 2x10 ? ?Manual Therapy: ?Sidelying scapular retraction/protraction contract/co-contract MET x10 cycles with increased protraction range each  cycle ?Deep tissue massage to medial scapular muscle attachments in shoulder IR x5 minutes ?IASTM with percussion device R rhomboid and mid trap, pt in prone ?PA and UPA tx spine, T4 - T7 pt in prone, G III - IV ?

## 2021-07-31 ENCOUNTER — Ambulatory Visit (INDEPENDENT_AMBULATORY_CARE_PROVIDER_SITE_OTHER): Payer: BC Managed Care – PPO | Admitting: Adult Health

## 2021-07-31 ENCOUNTER — Encounter (INDEPENDENT_AMBULATORY_CARE_PROVIDER_SITE_OTHER): Payer: Self-pay | Admitting: Adult Health

## 2021-07-31 VITALS — BP 100/68 | HR 70 | Temp 97.6°F | Ht 67.0 in | Wt 244.0 lb

## 2021-07-31 DIAGNOSIS — Z6838 Body mass index (BMI) 38.0-38.9, adult: Secondary | ICD-10-CM

## 2021-07-31 DIAGNOSIS — E669 Obesity, unspecified: Secondary | ICD-10-CM | POA: Diagnosis not present

## 2021-07-31 DIAGNOSIS — E162 Hypoglycemia, unspecified: Secondary | ICD-10-CM | POA: Diagnosis not present

## 2021-07-31 DIAGNOSIS — K219 Gastro-esophageal reflux disease without esophagitis: Secondary | ICD-10-CM

## 2021-07-31 DIAGNOSIS — E559 Vitamin D deficiency, unspecified: Secondary | ICD-10-CM | POA: Diagnosis not present

## 2021-07-31 DIAGNOSIS — I25709 Atherosclerosis of coronary artery bypass graft(s), unspecified, with unspecified angina pectoris: Secondary | ICD-10-CM | POA: Diagnosis not present

## 2021-07-31 DIAGNOSIS — E66813 Obesity, class 3: Secondary | ICD-10-CM

## 2021-07-31 MED ORDER — VITAMIN D (ERGOCALCIFEROL) 1.25 MG (50000 UNIT) PO CAPS: 50000.0000 [IU] | ORAL_CAPSULE | ORAL | 0 refills | Status: DC

## 2021-08-01 ENCOUNTER — Ambulatory Visit: Payer: BC Managed Care – PPO | Attending: Internal Medicine | Admitting: Physical Therapy

## 2021-08-01 ENCOUNTER — Ambulatory Visit (INDEPENDENT_AMBULATORY_CARE_PROVIDER_SITE_OTHER): Payer: BC Managed Care – PPO | Admitting: Nurse Practitioner

## 2021-08-01 ENCOUNTER — Telehealth: Payer: Self-pay | Admitting: Physical Therapy

## 2021-08-01 DIAGNOSIS — G8929 Other chronic pain: Secondary | ICD-10-CM | POA: Insufficient documentation

## 2021-08-01 DIAGNOSIS — R252 Cramp and spasm: Secondary | ICD-10-CM | POA: Insufficient documentation

## 2021-08-01 DIAGNOSIS — M546 Pain in thoracic spine: Secondary | ICD-10-CM | POA: Insufficient documentation

## 2021-08-01 DIAGNOSIS — M545 Low back pain, unspecified: Secondary | ICD-10-CM | POA: Insufficient documentation

## 2021-08-01 DIAGNOSIS — M6281 Muscle weakness (generalized): Secondary | ICD-10-CM | POA: Insufficient documentation

## 2021-08-01 NOTE — Telephone Encounter (Signed)
Called and informed patient of missed visit and provided reminder of next appt and attendance policy.  

## 2021-08-03 ENCOUNTER — Other Ambulatory Visit: Payer: Self-pay

## 2021-08-03 ENCOUNTER — Encounter (HOSPITAL_BASED_OUTPATIENT_CLINIC_OR_DEPARTMENT_OTHER): Payer: Self-pay | Admitting: Emergency Medicine

## 2021-08-03 ENCOUNTER — Emergency Department (HOSPITAL_BASED_OUTPATIENT_CLINIC_OR_DEPARTMENT_OTHER)
Admission: EM | Admit: 2021-08-03 | Discharge: 2021-08-03 | Disposition: A | Discharge status: Home / Self Care | Payer: BC Managed Care – PPO | Attending: Emergency Medicine | Admitting: Emergency Medicine

## 2021-08-03 DIAGNOSIS — R21 Rash and other nonspecific skin eruption: Secondary | ICD-10-CM | POA: Diagnosis not present

## 2021-08-03 DIAGNOSIS — E039 Hypothyroidism, unspecified: Secondary | ICD-10-CM | POA: Insufficient documentation

## 2021-08-03 DIAGNOSIS — I2510 Atherosclerotic heart disease of native coronary artery without angina pectoris: Secondary | ICD-10-CM | POA: Insufficient documentation

## 2021-08-03 DIAGNOSIS — I129 Hypertensive chronic kidney disease with stage 1 through stage 4 chronic kidney disease, or unspecified chronic kidney disease: Secondary | ICD-10-CM | POA: Diagnosis not present

## 2021-08-03 DIAGNOSIS — L270 Generalized skin eruption due to drugs and medicaments taken internally: Secondary | ICD-10-CM

## 2021-08-03 DIAGNOSIS — N183 Chronic kidney disease, stage 3 unspecified: Secondary | ICD-10-CM | POA: Insufficient documentation

## 2021-08-03 DIAGNOSIS — T50Z95A Adverse effect of other vaccines and biological substances, initial encounter: Secondary | ICD-10-CM

## 2021-08-03 MED ORDER — DIPHENHYDRAMINE HCL 25 MG PO TABS: 25.0000 mg | ORAL_TABLET | Freq: Four times a day (QID) | ORAL | 0 refills | Status: DC | PRN

## 2021-08-03 MED ORDER — DIPHENHYDRAMINE HCL 25 MG PO CAPS
25.0000 mg | ORAL_CAPSULE | Freq: Once | ORAL | Status: AC
  Administered 2021-08-03: 25 mg via ORAL
  Filled 2021-08-03: qty 1

## 2021-08-03 NOTE — ED Notes (Signed)
Patient given discharge instructions. Questions were answered. Patient verbalized understanding of discharge instructions and care at home.  Discharged with family 

## 2021-08-03 NOTE — ED Provider Notes (Signed)
?[facility] EMERGENCY DEPT ?Provider Note ? ? ?CSN: [id number] ?Arrival date & time: 08/03/21  1713 ? ?  ? ?History ? ?Chief Complaint  ?Patient presents with  ? Rash  ? ? ?[name] is a 66 y.o. female with chief complaint of sparse bodily rash.  Recently received her sixth COVID booster a week ago.  2 days later developed some scattered small red bumps on her body, described as red and itchy.  They all appeared at the same time.  Rash is without any discharge, bleeding, or pain.  Denies fever, nausea, vomiting, abdominal pain, headache, stiff neck, recent upper respiratory infection, shortness of breath, chest pain.  Tried 5 mg of Benadryl yesterday with no relief.  Denies being in close contact with anyone of similar symptoms, recent bug bites, or starting any new medications, topical products, or foods.  Hx of SLE, is on Plaquenil but not any chronic steroid therapies.  States she was unable to get in with her PCP yesterday for evaluation. ? ?The history is provided by the patient and medical records.  ?Rash ? ?  ? ?Home Medications ?Prior to Admission medications   ?Medication Sig Start Date End Date Taking? Authorizing Provider  ?diphenhydrAMINE (BENADRYL) 25 MG tablet Take 1 tablet (25 mg total) by mouth every 6 (six) hours as needed. 08/03/21  Yes Prince Rome, PA-C  ?albuterol (VENTOLIN HFA) 108 (90 Base) MCG/ACT inhaler Inhale 2 puffs into the lungs every 6 (six) hours as needed for wheezing or shortness of breath. 02/22/21   Michela Pitcher, NP  ?aspirin 81 MG chewable tablet Chew 1 tablet (81 mg total) by mouth daily. 02/28/21   Florencia Reasons, MD  ?azaTHIOprine (IMURAN) 50 MG tablet TAKE 1 TABLET (50 MG TOTAL) BY MOUTH IN THE MORNING AND AT BEDTIME. 07/10/21   Janith Lima, MD  ?cyclobenzaprine (FLEXERIL) 5 MG tablet Take 1 tablet (5 mg total) by mouth 3 (three) times daily as needed. 02/27/21   Florencia Reasons, MD  ?dapagliflozin propanediol (FARXIGA) 10 MG TABS tablet Take 1 tablet (10 mg  total) by mouth daily. 03/27/21   Belva Crome, MD  ?furosemide (LASIX) 20 MG tablet Take 1 tablet (20 mg total) by mouth daily. 03/27/21   Belva Crome, MD  ?hydroxychloroquine (PLAQUENIL) 200 MG tablet Take 200 mg by mouth daily.     [provider]  ?indapamide (LOZOL) 1.25 MG tablet TAKE 1 TABLET BY MOUTH EVERY DAY 06/04/21   Janith Lima, MD  ?isosorbide mononitrate (IMDUR) 30 MG 24 hr tablet Take 1 tablet (30 mg total) by mouth daily. 03/27/21   Belva Crome, MD  ?levothyroxine (SYNTHROID) 75 MCG tablet Take 1 tablet (75 mcg total) by mouth daily before breakfast. 05/20/21   Janith Lima, MD  ?metoprolol succinate (TOPROL-XL) 50 MG 24 hr tablet TAKE 1 TABLET (50 MG TOTAL) BY MOUTH DAILY. KEEP FOLLOW UP FOR FUTURE REFILLS. ?Patient taking differently: Take 50 mg by mouth daily. 02/13/21   Belva Crome, MD  ?nitroGLYCERIN (NITROSTAT) 0.4 MG SL tablet Place 1 tablet (0.4 mg total) under the tongue every 5 (five) minutes as needed for chest pain. 02/27/21   Florencia Reasons, MD  ?oxyCODONE (OXY IR/ROXICODONE) 5 MG immediate release tablet Take 1 tablet (5 mg total) by mouth every 4 (four) hours as needed for moderate pain. 02/27/21   Florencia Reasons, MD  ?pantoprazole (PROTONIX) 40 MG tablet TAKE 1 TABLET BY MOUTH EVERY DAY ?Patient taking differently: 40 mg daily.  10/22/20   Belva Crome, MD  ?potassium chloride SA (KLOR-CON M) 20 MEQ tablet Take 1 tablet (20 mEq total) by mouth daily. 02/27/21   Florencia Reasons, MD  ?pregabalin (LYRICA) 50 MG capsule Take 50 mg by mouth 2 (two) times daily.    [provider]  ?rosuvastatin (CRESTOR) 20 MG tablet Take 1 tablet (20 mg total) by mouth daily. 03/19/21   Belva Crome, MD  ?Vitamin D, Ergocalciferol, (DRISDOL) 1.25 MG (50000 UNIT) CAPS capsule Take 1 capsule (50,000 Units total) by mouth every 7 (seven) days. 07/31/21   Esaw Grandchild, NP  ?   ? ?Allergies    ?Amlodipine and Cellcept [mycophenolate]   ? ?Review of Systems   ?Review of Systems  ?Skin:  Positive for  rash.  ? ?Physical Exam ?Updated Vital Signs ?BP 114/76 (BP Location: Left Arm)   Pulse 62   Temp 98.3 ?F (36.8 ?C)   Resp 18   Ht '5\' 7"'$  (1.702 m)   Wt 110.2 kg   SpO2 100%   BMI 38.06 kg/m?  ?Physical Exam ?Vitals and nursing note reviewed.  ?Constitutional:   ?   General: She is not in acute distress. ?   Appearance: Normal appearance. She is well-developed. She is obese. She is not ill-appearing or diaphoretic.  ?HENT:  ?   Head: Normocephalic and atraumatic.  ?Eyes:  ?   Conjunctiva/sclera: Conjunctivae normal.  ?Cardiovascular:  ?   Rate and Rhythm: Normal rate and regular rhythm.  ?   Pulses: Normal pulses.  ?   Heart sounds: Murmur (Pre-existing) heard.  ?Pulmonary:  ?   Effort: Pulmonary effort is normal. No respiratory distress.  ?   Breath sounds: Normal breath sounds.  ?Abdominal:  ?   General: Bowel sounds are normal.  ?   Palpations: Abdomen is soft.  ?   Tenderness: There is no abdominal tenderness.  ?Musculoskeletal:     ?   General: No swelling.  ?   Cervical back: Neck supple. No rigidity or tenderness.  ?   Right lower leg: No edema.  ?   Left lower leg: No edema.  ?Skin: ?   General: Skin is warm and dry.  ?   Capillary Refill: Capillary refill takes less than 2 seconds.  ?   Findings: Rash present. Rash is macular.  ?   Comments: Rash consisting of scattered small 1-2 mm erythematous macular coin shaped lesions.  A few of them had a mild pale center.  Extremities had anywhere from 2-6 each.  2 noted on the back.  4-5 noted on the abdomen.  Rash did not appear ulcerated, urticarial, vesicular, or scaly.  No evidence of crusting or nodules.  Oral mucosa, palms, and soles are spared.  ?Neurological:  ?   Mental Status: She is alert and oriented to person, place, and time.  ?Psychiatric:     ?   Mood and Affect: Mood normal.  ? ? ?ED Results / Procedures / Treatments   ?Labs ?(all labs ordered are listed, but only abnormal results are displayed) ?Labs Reviewed - No data to  display ? ?EKG ?None ? ?Radiology ?No results found. ? ?Procedures ?Procedures  ? ? ?Medications Ordered in ED ?Medications  ?diphenhydrAMINE (BENADRYL) capsule 25 mg (25 mg Oral Given 08/03/21 2149)  ? ? ?ED Course/ Medical Decision Making/ A&P ?  ?                        ?  Medical Decision Making ?Amount and/or Complexity of Data Reviewed ?External Data Reviewed: notes. ?Labs:  Decision-making details documented in ED Course. ?Radiology:  Decision-making details documented in ED Course. ?ECG/medicine tests:  Decision-making details documented in ED Course. ? ?Risk ?OTC drugs. ?Prescription drug management. ? ? ?66 y.o. female presents to the ED for concern of Rash ?  ?This involves an extensive number of treatment options, and is a complaint that carries with it a high risk of complications and morbidity.  ? ?Past Medical History / Co-morbidities / Social History: ?Significant history for GERD, SLE, prior MI, CAD, severe mitral regurg, hyperlipidemia, OSA, chronic constipation, CHF, hypothyroidism, CKD stage III ?Recently received her sixth COVID booster ? ?Additional History:  ?Internal and external records from outside source obtained and reviewed including family medicine ? ?Physical Exam: ?Physical exam performed. The pertinent findings include: Sparse scattered erythematous pruritic lesions across the upper and lower extremities, back, and abdomen. ? ?Lab Tests: ?None ? ?Imaging Studies: ?None ? ?Medications: ?I ordered medication including Benadryl for pruritic relief.  Reevaluation of the patient after these medicines showed that the patient showed mild to moderate improvement.  Patient accompanied by friend who is acting as the patient's transportation.  I have reviewed the patients home medicines and have made adjustments as needed ? ?ED Course/Disposition: ?Pt well-appearing on exam.  Presenting with sparse pruritic rash 2 days following her sixth COVID booster.  Has never had a pruritic reaction like this  before to a COVID booster.  No new medications, topical products, foods, bug bites.  Considered erythematous multiform, history and physical exam not suggestive.  Considered Lyme or [facility] spotted fever or syphilis, but a

## 2021-08-03 NOTE — ED Triage Notes (Signed)
Reports rash 2 days after her 6th covid booster, also reports nose bleed. No active bleeding ,  ?Itching rash all over her body , started back , bilateral arms , bilateral legs .  ?Obvious red small skin irruption. No drainage .  ?

## 2021-08-03 NOTE — Discharge Instructions (Signed)
Prescription for Benadryl has been sent to your pharmacy.  These are 25 mg tablets.  He may take 1 every 6-8 hours as needed for symptom relief. ? ?Schedule a follow-up with your PCP within the next 3 to 4 days for reevaluation and continued medical management. ? ?Return to the ED for new or worsening symptoms as discussed. ?

## 2021-08-05 NOTE — Progress Notes (Signed)
? ? ? ?Chief Complaint:  ? ?OBESITY ?[name] is here to discuss her progress with her obesity treatment plan along with follow-up of her obesity related diagnoses. [name] is on the Category 1 Plan with breakfast and lunch options. She states she is following her eating plan approximately 100% of the time. [name] states she is walking and has physical therapy 15-30 minutes 2 times per week. ? ?Today's visit was #: 7 ?Starting weight: 258 lbs ?Starting date: 04/09/2021 ?Today's weight: 244 lbs ?Today's date: 07/22/2021 ?Total lbs lost to date: 71 ?Total lbs lost since last in-office visit: 3 ? 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  ?40 hr work week.  ? ?Subjective:  ? ?1. Gastroesophageal reflux disease, unspecified whether esophagitis present ?On 07/09/21 had office visit with GI/Dr. Collene Mares.  ?She was recommended to slowly increase fiber to 20-25 grams a day. She is to avoid NSAID's. ? ?2. Vitamin D deficiency ?On 04/09/21, her Vit d level at 1.2- well below goal of 50-70. ?She is on Ergocalciferol- denies N/V/muscle weakness. ? ?3. Hypoglycemia ?At least once a week, she will experience symptoms of hypoglycemia irritability and fatigue.  ?She will consume a snack, then symptoms resolve.  ? ?Assessment/Plan:  ? ?1. Gastroesophageal reflux disease, unspecified whether esophagitis present ?She will continue with daily Protonix 40 mg and dietary record, per GI. ? ?2. Vitamin D deficiency ?[name] will have her labs rechecked in 2-3 months.  ?We will refill ergocalciferol 50,000 UI once weekly for 1 month with no refills. ? ?-Refill Vitamin D, Ergocalciferol, (DRISDOL) 1.25 MG (50000 UNIT) CAPS capsule; Take 1 capsule (50,000 Units total) by mouth every 7 (seven) days.  Dispense: 4 capsule; Refill: 0 ? ?3. Hypoglycemia ?[name] is to eat every few hours. ? ?4. Obesity with current BMI of 38.2 ?[name] is currently in the action stage of change. As  such, her goal is to continue with weight loss efforts. She has agreed to the Category 1 Plan with breakfast options. ? ?Exercise goals: As is. ? ?Behavioral modification strategies: increasing lean protein intake, decreasing simple carbohydrates, meal planning and cooking strategies, keeping healthy foods in the home, and planning for success. ? ?[name] has agreed to follow-up with our clinic in 3 weeks. She was informed of the importance of frequent follow-up visits to maximize her success with intensive lifestyle modifications for her multiple health conditions.  ? ?Objective:  ? ?Blood pressure 100/68, pulse 70, temperature 97.6 ?F (36.4 ?C), height '5\' 7"'$  (1.702 m), weight 244 lb (110.7 kg), SpO2 97 %. ?Body mass index is 38.22 kg/m?. ? ?General: Cooperative, alert, well developed, in no acute distress. ?HEENT: Conjunctivae and lids unremarkable. ?Cardiovascular: Regular rhythm.  ?Lungs: Normal work of breathing. ?Neurologic: No focal deficits.  ? ?Lab Results  ?Component Value Date  ? CREATININE 1.41 (H) 04/03/2021  ? BUN 26 (H) 04/03/2021  ? NA 138 04/03/2021  ? K 4.1 04/03/2021  ? CL 103 04/03/2021  ? CO2 29 04/03/2021  ? ?Lab Results  ?Component Value Date  ? ALT 12 02/26/2021  ? AST 20 02/26/2021  ? ALKPHOS 66 02/26/2021  ? BILITOT 0.7 02/26/2021  ? ?Lab Results  ?Component Value Date  ? HGBA1C 5.2 02/25/2021  ? HGBA1C 5.6 01/31/2017  ? ?Lab Results  ?Component Value Date  ? INSULIN 17.2 04/09/2021  ? ?Lab Results  ?Component Value Date  ? TSH 1.60 04/03/2021  ? ?Lab Results  ?Component Value  Date  ? CHOL 110 02/25/2021  ? HDL 44 02/25/2021  ? [place] 59 02/25/2021  ? LDLDIRECT 321.8 02/27/2012  ? TRIG 36 02/25/2021  ? CHOLHDL 2.5 02/25/2021  ? ?Lab Results  ?Component Value Date  ? VD25OH 17.2 (L) 04/09/2021  ? ?Lab Results  ?Component Value Date  ? WBC 3.4 (L) 02/26/2021  ? HGB 12.9 02/26/2021  ? HCT 38.0 02/26/2021  ? MCV 100.8 (H) 02/26/2021  ? PLT 160 02/26/2021  ? ?Lab Results  ?Component Value Date   ? IRON 386 06/23/2017  ? TIBC 285 06/23/2017  ? FERRITIN 58 06/23/2017  ? ? ?Obesity Behavioral Intervention:  ? ?Approximately 15 minutes were spent on the discussion below. ? ?ASK: ?We discussed the diagnosis of obesity with Levada Dy today and Alaena agreed to give [name] permission to discuss obesity behavioral modification therapy today. ? ?ASSESS: ?Krissie has the diagnosis of obesity and her BMI today is 38.2. Gurtha is in the action stage of change.  ? ?ADVISE: ?Temima was educated on the multiple health risks of obesity as well as the benefit of weight loss to improve her health. She was advised of the need for long term treatment and the importance of lifestyle modifications to improve her current health and to decrease her risk of future health problems. ? ?AGREE: ?Multiple dietary modification options and treatment options were discussed and Karne agreed to follow the recommendations documented in the above note. ? ?ARRANGE: ?Jazzmyn was educated on the importance of frequent visits to treat obesity as outlined per CMS and USPSTF guidelines and agreed to schedule her next follow up appointment today. ? ?Attestation Statements:  ? ?Reviewed by clinician on day of visit: allergies, medications, problem list, medical history, surgical history, family history, social history, and previous encounter notes. ? ?I, Brendell Tyus, RMA, am acting as transcriptionist for Mina Marble, NP. ? ?I have reviewed the above documentation for accuracy and completeness, and I agree with the above. -  Phillipe Clemon d. Jyaire Koudelka, NP-C ? ?

## 2021-08-06 DIAGNOSIS — E162 Hypoglycemia, unspecified: Secondary | ICD-10-CM | POA: Insufficient documentation

## 2021-08-08 ENCOUNTER — Ambulatory Visit: Payer: BC Managed Care – PPO

## 2021-08-08 DIAGNOSIS — R252 Cramp and spasm: Secondary | ICD-10-CM | POA: Diagnosis not present

## 2021-08-08 DIAGNOSIS — M546 Pain in thoracic spine: Secondary | ICD-10-CM

## 2021-08-08 DIAGNOSIS — G8929 Other chronic pain: Secondary | ICD-10-CM | POA: Diagnosis not present

## 2021-08-08 DIAGNOSIS — M6281 Muscle weakness (generalized): Secondary | ICD-10-CM

## 2021-08-08 DIAGNOSIS — M545 Low back pain, unspecified: Secondary | ICD-10-CM | POA: Diagnosis not present

## 2021-08-08 NOTE — Therapy (Signed)
OUTPATIENT PHYSICAL THERAPY TREATMENT NOTE/ DISCHARGE SUMMARY   Patient Name: [name] MRN: [id number] DOB:Dec 06, 1955, 66 y.o., female Today's Date: 08/08/2021  PCP: Janith Lima, MD REFERRING PROVIDER: Janith Lima, MD   PT End of Session - 08/08/21 1833     Visit Number 14    Number of Visits 16    Date for PT Re-Evaluation 08/08/21    Authorization Type BCBS    PT Start Time 1826    PT Stop Time 1904    PT Time Calculation (min) 38 min    Activity Tolerance Patient tolerated treatment well    Behavior During Therapy WFL for tasks assessed/performed                      Past Medical History:  Diagnosis Date   Back pain    Bilateral swelling of feet and ankles    CAD (coronary artery disease)    a. remote stents/angioplasty. b. s/p CABG 01/2017 with mitral valve annuloplasty.   Chest pain    Constipation    Diastolic heart failure (HCC)    Esophageal reflux    Gallbladder problem    GERD (gastroesophageal reflux disease)    History of MI (myocardial infarction)    Hyperlipidemia    Hypertension    Hypothyroidism    Immune thrombocytopenic purpura ([place])    Joint pain    Kidney problem    Lupus erythematosus    Mitral valve insufficiency and aortic valve insufficiency    Myocardial infarction (HCC)    x 2   Other fatigue    PONV (postoperative nausea and vomiting)    Postoperative atrial fibrillation (HCC)    Renal disease    Severe mitral regurgitation    a. s/p MV annuloplasty 01/2017 at time of CABG.   Shortness of breath on exertion    Sleep apnea    Status post mitral valve annuloplasty    Unspecified disease of pericardium    Past Surgical History:  Procedure Laterality Date   CHOLECYSTECTOMY, LAPAROSCOPIC  2010   CORONARY ARTERY BYPASS GRAFT N/A 01/30/2017   Procedure: CORONARY ARTERY BYPASS GRAFTING (CABG), Times four  using the right saphaneous vein, harvested endoscopicly.  and left internal mammary artery .;   Surgeon: Gaye Pollack, MD;  Location: [facility];  Service: Open Heart Surgery;  Laterality: N/A;   LEFT HEART CATH AND CORONARY ANGIOGRAPHY N/A 01/23/2017   Procedure: LEFT HEART CATH AND CORONARY ANGIOGRAPHY;  Surgeon: Belva Crome, MD;  Location: [place] [facility];  Service: Cardiovascular;  Laterality: N/A;   MITRAL VALVE REPAIR N/A 01/30/2017   Procedure: MITRAL VALVE REPAIR (MVR);  Surgeon: Gaye Pollack, MD;  Location: [place];  Service: Open Heart Surgery;  Laterality: N/A;   plastic surgical repair      dog bite to leg   RIGHT/LEFT HEART CATH AND CORONARY/GRAFT ANGIOGRAPHY N/A 02/26/2021   Procedure: RIGHT/LEFT HEART CATH AND CORONARY/GRAFT ANGIOGRAPHY;  Surgeon: Belva Crome, MD;  Location: [place] [facility];  Service: Cardiovascular;  Laterality: N/A;   SPLENECTOMY  5-04   TEE WITHOUT CARDIOVERSION N/A 01/27/2017   Procedure: TRANSESOPHAGEAL ECHOCARDIOGRAM (TEE);  Surgeon: Sanda Klein, MD;  Location: [facility];  Service: Cardiovascular;  Laterality: N/A;   TEE WITHOUT CARDIOVERSION N/A 01/30/2017   Procedure: TRANSESOPHAGEAL ECHOCARDIOGRAM (TEE);  Surgeon: Gaye Pollack, MD;  Location: [place];  Service: Open Heart Surgery;  Laterality: N/A;   ULTRASOUND GUIDANCE FOR VASCULAR ACCESS  01/23/2017  Procedure: Ultrasound Guidance For Vascular Access;  Surgeon: Belva Crome, MD;  Location: [place] [facility];  Service: Cardiovascular;;   Patient Active Problem List   Diagnosis Date Noted   Hypoglycemia 08/06/2021   Vitamin D deficiency 06/17/2021   Need for shingles vaccine 04/08/2021   Encounter for general adult medical examination with abnormal findings 04/08/2021   Chronic heart failure with preserved ejection fraction ([place]) 04/03/2021   Hyperlipidemia LDL goal <70 04/03/2021   Screen for colon cancer 04/03/2021   Systemic lupus erythematosus ([place]) 02/25/2021   Stage 3a chronic kidney disease ([place])    Hypertension 08/08/2020   Chronic idiopathic constipation 05/31/2020    Class 3 severe obesity with serious comorbidity and body mass index (BMI) of 40.0 to 44.9 in adult [facility]) 05/31/2020   Postoperative atrial fibrillation ([place]) 02/18/2017   Osteoarthritis of spine with radiculopathy, lumbar region 06/24/2016   Hypothyroidism 04/08/2011   Coronary artery disease involving coronary bypass graft of native heart with angina pectoris ([place]) 02/26/2009   Gastroesophageal reflux disease 08/05/2007   Systemic lupus erythematosus with focal and segmental proliferative glomerulonephritis ([place]) 08/05/2007   REFERRING DIAG: M54.9 (ICD-10-CM) - Upper back pain on right side   THERAPY DIAG:  Pain in thoracic spine   Chronic bilateral low back pain without sciatica   Cramp and spasm   Muscle weakness (generalized)   ONSET DATE: chronic; >2 years    SUBJECTIVE:                                     SUBJECTIVE STATEMENT: Pt reports that she has seen a 90% improvement in her symptoms since starting PT and is ready for discharge at this time. She reports her HEP has been going well.  PAIN:  Are you having pain? Yes 0/10 Upper thoracic spine, R side  PERTINENT HISTORY:  Recent hospitalization 12/5-12/7/22 for acute CHF Systemic Lupus Erythematosus CAD s/p MI 2003 S/p CABG 2018 Atrial fibrillation See PMH above     PRECAUTIONS: None   WEIGHT BEARING RESTRICTIONS No   OBJECTIVE:   *Unless otherwise noted all objective measures were captured on initial evaluation.   DIAGNOSTIC FINDINGS:  Reports 3 spinal MRI in the past year with patient reporting findings of arthritis. Imaging is not on file.    PATIENT SURVEYS:  FOTO 40% function to 51% predicted 05/16/21: 46% function  05/23/21: 46% function  06/20/2021: 44 % function 08/08/2021: 48% function           POSTURE:  Forward head, rounded shoulders    PALPATION: TTP Rt thoracic paraspinals with spasm occurring with palpation.  05/23/21: TTP Rt thoracic paraspinals    LUMBARAROM/PROM   A/PROM A/PROM   04/04/2021 05/23/21  Flexion WNL minor LBP WNL   Extension WNL; pulling along back WNL; pain mid/low back   Right lateral flexion WNL; pulling along back WNL  Left lateral flexion WNL WNL  Right rotation WNL WNL  Left rotation WNL; pain along Rt upper thoracic  WNL   (Blank rows = not tested)   LE MMT:   MMT Right 04/04/2021 Left 04/04/2021 05/23/21 08/08/2021  Hip flexion 4+ 5 5/5 bilateral    Hip extension        Hip abduction 3+ 4- 4-/5 bilateral  5/5 BIL  Hip adduction        Hip internal rotation        Hip external rotation  Knee flexion        Knee extension        Ankle dorsiflexion        Ankle plantarflexion        Ankle inversion        Ankle eversion         (Blank rows = not tested)     UPPER EXTREMITY AROM/PROM: Shoulder AROM WNL bilaterally with patient reporting discomfort in the midback with functional ER on the RUE. 05/23/21: Bilateral shoulder AROM WNL, pain free    (Blank rows = not tested)   UPPER EXTREMITY MMT: Rt mid thoracic pain with all MMT on the RUE; 05/23/21: no pain with shoulder MMT   MMT Right 04/04/2021 Left 04/04/2021 04/18/21 04/25/21 05/02/21 05/23/21 07/04/2021 08/08/2021  Shoulder flexion 4 5 Rt: 4+/5 Lt 5/5    5/5 bilaterally 5/5 bilaterally   Shoulder extension            Shoulder abduction 4 5   5/5 bilaterally pain free  5/5 bilaterally  5/5 bilaterally   Shoulder adduction            Middle trapezius 4- 4-  Lt: 4+/5 Rt: 4/5    Lt: 4+/5 Rt: 4/5  4+/5 BIL, p! on Rt 4+/5 BIL  Lower trapezius          4+/5 BIL  Shoulder ER 4 5    5/5 bilaterally 5/5 bilaterally   Shoulder IR 4 5    5/5 bilaterally  5/5 bilaterally   Wrist flexion            Wrist extension            Wrist ulnar deviation            Wrist radial deviation            Wrist pronation            Wrist supination            Grip strength (lbs)            (Blank rows = not tested)     JOINT MOBILITY TESTING:  Pain before restriction thoracic spine PAIVM 05/23/21: T-spine  mobility WNL, mild pain with upper T-spine PAIVM    LUMBAR SPECIAL TESTS:  (-) SLR        TODAY'S TREATMENT   [facility] Adult PT Treatment:                                                DATE: 08/08/2021 Therapeutic Exercise: Seated rows with 40# cable 2x10 Seated high rows with 40# cable 2x10 Seated lat pull-down with 40# cable 2x10 Standing BIL shoulder scaption at wall with 4# dumbbells Standing low arm circles with 4# dumbbells 2x30sec Standing shoulder extension with 3# cables 2x10 Manual Therapy: N/A Neuromuscular re-ed: N/A Therapeutic Activity: Re-administration of FOTO with pt education Re-assessment of objective measures Updated HEP Modalities: N/A Self Care: N/A   [facility] Adult PT Treatment:                                                DATE: 07/18/2021 Therapeutic Exercise: Standing 5# KB OH press - 2x10 Seated rows with 35# cable 2x10 Seated  high rows with 35# cable 2x10 Seated lat pull-down with 35# cable 2x10  Manual Therapy: Sidelying scapular retraction/protraction contract/co-contract MET x10 cycles with increased protraction range each cycle Deep tissue massage to medial scapular muscle attachments in shoulder IR x5 minutes IASTM with percussion device R rhomboid and mid trap, pt in prone PA and UPA tx spine, T4 - T7 pt in prone, G III - IV  [facility] Adult PT Treatment:                                                DATE: 07/04/2021 Therapeutic Exercise: Standing single arm rear delt flies 2x8 BIL Seated rows with 30# cable 2x10 Seated high rows with 30# cable 2x10 Seated lat pull-down with 30# cable 2x10 Seated shoulder rolls 2x10 forward and backward Prone swimmers 3x20 Manual Therapy: Sidelying rhythmic stabilization of scapula x5 minutes Sidelying scapular retraction/protraction contract/co-contract MET x10 cycles with increased protraction range each cycle Deep tissue massage to medial scapular muscle attachments in shoulder IR x5  minutes Neuromuscular re-ed: N/A Therapeutic Activity: N/A Modalities: Moist heat pack to thoracic region x8 minutes with no adverse response Self Care: N/A      PATIENT EDUCATION:  Education details: n/a Person educated: n/a  Education method: n/a Education comprehension: n/a     HOME EXERCISE PROGRAM: Access Code: [email or web address] URL: [email or web address] Date: Updated 08/08/2021 Prepared by: Vanessa [place]  Exercises - Seated Thoracic Lumbar Extension with Pectoralis Stretch  - 1 x daily - 7 x weekly - 1 sets - 10 reps - Doorway Rhomboid Stretch  - 1 x daily - 7 x weekly - 3 sets - 30 sec  hold - Prone Scapular Retraction Arms at Side  - 1 x daily - 7 x weekly - 2 sets - 10 reps - Standing Shoulder Row with Anchored Resistance  - 1 x daily - 7 x weekly - 2 sets - 10 reps - Supine Bridge  - 1 x daily - 7 x weekly - 2 sets - 10 reps - Supine Figure 4 Piriformis Stretch  - 1 x daily - 7 x weekly - 3 sets - 30  hold - Modified Thomas Stretch  - 1 x daily - 7 x weekly - 3 sets - 30 sec  hold - Shoulder extension with resistance - Neutral  - 1 x daily - 7 x weekly - 3 sets - 10 reps  Patient Education - walking program    ASSESSMENT:   CLINICAL IMPRESSION:  Pt responded well to all interventions today, reporting 0/10 pain throughout the session. Upon re-assessment of objective measures, the pt has met her parascapular strength goal, hip strength goal, pain goal, and nearly met her FOTO goal. Due to these factors, the pt is discharged from PT at this time with an updated HEP.       GOALS: Goals reviewed with patient? No   SHORT TERM GOALS:   STG Name Target Date Goal status  1 Patient will be independent and compliant with initial HEP.    Baseline: no time at eval 04/18/2021 ACHIEVED  2 Patient will report pain <5/10 indicative of improvements in overall condition.  Baseline:  04/25/2021 ACHIEVED     LONG TERM GOALS:    LTG Name Target Date Goal status   1 Patient will demonstrate 4+/5 Rt shoulder strength without reports of back pain to improve  ability to complete lifting and carrying activity.  Baseline: 05/16/2021 ACHIEVED  2 Patient will demonstrate 4+/5 bilateral middle trap strength to improve scapular stability.  Baseline: 07/04/2021: See updated MMT chart 08/08/2021:4+/5 BIL 05/16/2021 ACHIEVED   3 Patient will score at least 51% on FOTO to signify clinically meaningful improvement in functional abilities.    Baseline: 40% 06/20/2021: 44% 08/08/2021: 48% 05/16/2021 Partially Met  4 Patient will demonstrate at least 4+/5 bilateral hip abductor strength to improve stability about the chain with prolonged walking.  Baseline: 08/08/2021:5/5 BIL 05/16/2021 ACHIEVED  5 *added 07/04/2021* Patient will report decrease in baseline thoracic pain to 0-2/10 in order to complete her work tasks with less limitation. 08/08/2021: 0/10 08/08/2021 ACHIEVED    PLAN: PT FREQUENCY: 1x/week   PT DURATION: 4 weeks   PLANNED INTERVENTIONS: Therapeutic exercises, Therapeutic activity, Neuro Muscular re-education, Balance training, Patient/Family education, Joint mobilization, Dry Needling, Cryotherapy, Moist heat, and Manual therapy   PLAN FOR NEXT SESSION: Pt is discharged from PT at this time.    PHYSICAL THERAPY DISCHARGE SUMMARY  Visits from Start of Care: 14  Current functional level related to goals / functional outcomes: Pt has met or nearly met all of her functional rehab goals.   Remaining deficits: Intermittent low-level thoracic pain   Education / Equipment: HEP   Patient agrees to discharge. Patient goals were met. Patient is being discharged due to meeting the stated rehab goals.   Carolann Littler Catherin Doorn PT 08/08/21 7:04 PM

## 2021-08-13 DIAGNOSIS — I25810 Atherosclerosis of coronary artery bypass graft(s) without angina pectoris: Secondary | ICD-10-CM | POA: Diagnosis not present

## 2021-08-13 DIAGNOSIS — D631 Anemia in chronic kidney disease: Secondary | ICD-10-CM | POA: Diagnosis not present

## 2021-08-13 DIAGNOSIS — M329 Systemic lupus erythematosus, unspecified: Secondary | ICD-10-CM | POA: Diagnosis not present

## 2021-08-13 DIAGNOSIS — R809 Proteinuria, unspecified: Secondary | ICD-10-CM | POA: Diagnosis not present

## 2021-08-13 DIAGNOSIS — N055 Unspecified nephritic syndrome with diffuse mesangiocapillary glomerulonephritis: Secondary | ICD-10-CM | POA: Diagnosis not present

## 2021-08-13 DIAGNOSIS — I2510 Atherosclerotic heart disease of native coronary artery without angina pectoris: Secondary | ICD-10-CM | POA: Diagnosis not present

## 2021-08-13 DIAGNOSIS — E039 Hypothyroidism, unspecified: Secondary | ICD-10-CM | POA: Diagnosis not present

## 2021-08-13 DIAGNOSIS — I4891 Unspecified atrial fibrillation: Secondary | ICD-10-CM | POA: Diagnosis not present

## 2021-08-13 DIAGNOSIS — N181 Chronic kidney disease, stage 1: Secondary | ICD-10-CM | POA: Diagnosis not present

## 2021-08-13 LAB — BASIC METABOLIC PANEL
BUN: 29 — AB (ref 4–21)
CO2: 26 — AB (ref 13–22)
Chloride: 105 (ref 99–108)
Creatinine: 1.2 — AB (ref 0.5–1.1)
Glucose: 90
Potassium: 4.4 mEq/L (ref 3.5–5.1)
Sodium: 139 (ref 137–147)

## 2021-08-13 LAB — COMPREHENSIVE METABOLIC PANEL
Albumin: 3.7 (ref 3.5–5.0)
Calcium: 9.4 (ref 8.7–10.7)

## 2021-08-13 LAB — TSH: TSH: 1.57 (ref 0.41–5.90)

## 2021-08-15 ENCOUNTER — Ambulatory Visit (INDEPENDENT_AMBULATORY_CARE_PROVIDER_SITE_OTHER): Payer: BC Managed Care – PPO | Admitting: Family Medicine

## 2021-08-20 ENCOUNTER — Ambulatory Visit (INDEPENDENT_AMBULATORY_CARE_PROVIDER_SITE_OTHER): Payer: BC Managed Care – PPO | Admitting: Family Medicine

## 2021-08-20 ENCOUNTER — Encounter (INDEPENDENT_AMBULATORY_CARE_PROVIDER_SITE_OTHER): Payer: Self-pay | Admitting: Family Medicine

## 2021-08-20 ENCOUNTER — Encounter (INDEPENDENT_AMBULATORY_CARE_PROVIDER_SITE_OTHER): Payer: Self-pay

## 2021-08-20 VITALS — BP 118/84 | HR 68 | Temp 98.0°F | Ht 67.0 in | Wt 244.0 lb

## 2021-08-20 DIAGNOSIS — E559 Vitamin D deficiency, unspecified: Secondary | ICD-10-CM | POA: Diagnosis not present

## 2021-08-20 DIAGNOSIS — Z9189 Other specified personal risk factors, not elsewhere classified: Secondary | ICD-10-CM

## 2021-08-20 DIAGNOSIS — E8881 Metabolic syndrome: Secondary | ICD-10-CM | POA: Diagnosis not present

## 2021-08-20 DIAGNOSIS — I25709 Atherosclerosis of coronary artery bypass graft(s), unspecified, with unspecified angina pectoris: Secondary | ICD-10-CM

## 2021-08-20 DIAGNOSIS — E669 Obesity, unspecified: Secondary | ICD-10-CM | POA: Diagnosis not present

## 2021-08-20 DIAGNOSIS — Z6838 Body mass index (BMI) 38.0-38.9, adult: Secondary | ICD-10-CM | POA: Diagnosis not present

## 2021-08-20 MED ORDER — VITAMIN D (ERGOCALCIFEROL) 1.25 MG (50000 UNIT) PO CAPS: 50000.0000 [IU] | ORAL_CAPSULE | ORAL | 0 refills | Status: DC

## 2021-08-22 ENCOUNTER — Encounter (INDEPENDENT_AMBULATORY_CARE_PROVIDER_SITE_OTHER): Payer: Self-pay | Admitting: Family Medicine

## 2021-08-22 NOTE — Progress Notes (Signed)
Chief Complaint:   OBESITY [name] is here to discuss her progress with her obesity treatment plan along with follow-up of her obesity related diagnoses. [name] is on the Category 1 Plan with breakfast options and states she is following her eating plan approximately 100% of the time. [name] states she has been walking, but not lately.  Today's visit was #: 8 Starting weight: 258 lbs Starting date: 04/09/2021 Today's weight: 244 lbs Today's date: 08/20/2021 Total lbs lost to date: 14 lbs Total lbs lost since last in-office visit: 0  Interim History: [name] has not been walking lately.  Breakfast, 2 slices of toast with laughing cow on it and yogurt.  Lunch, salad with croutons, onions, cucumbers, and 4 oz grilled chicken.  Dinner 4 oz chicken or salmon with spinach or [employer].  Subjective:   1. Insulin resistance For the last 2 weeks she has had increase sweet and sugary cravings all day long.  It is not worse any time of day.  Not better with eating appears to be following meal plan well, but lacks enough protein.  2. Vitamin D deficiency Compliance is good, takes weekly.  She denies any issues.  Vitamin D at 17.2, four months ago.    3. At risk for side effect of medication [name] is at risk for drug side effects due to starting [facility].   Assessment/Plan:  No orders of the defined types were placed in this encounter.   Medications Discontinued During This Encounter  Medication Reason   Vitamin D, Ergocalciferol, (DRISDOL) 1.25 MG (50000 UNIT) CAPS capsule Reorder     Meds ordered this encounter  Medications   Vitamin D, Ergocalciferol, (DRISDOL) 1.25 MG (50000 UNIT) CAPS capsule    Sig: Take 1 capsule (50,000 Units total) by mouth every 7 (seven) days.    Dispense:  4 capsule    Refill:  0    30 d supply;  ** OV for RF **   Do not send RF request     1. Insulin resistance Obtain labs from rheumatology doctor on CMP and renal function.  She will consider  medications in future to help with symptoms.  Increase protein intake and don't skip them.  2. Vitamin D deficiency Obtain labs from Rheumatology doctor, as Vitamin D recently drawn last week.  Refill Ergocalciferol once weekly.  See below.   - Vitamin D, Ergocalciferol, (DRISDOL) 1.25 MG (50000 UNIT) CAPS capsule; Take 1 capsule (50,000 Units total) by mouth every 7 (seven) days.  Dispense: 4 capsule; Refill: 0  3. At risk for side effect of medication [name] was given approximately 9 minutes of drug side effect counseling today.  We discussed side effect possibility and risk versus benefits. [name] agreed to the medication and will contact this office if these side effects are intolerable.  Repetitive spaced learning was employed today to elicit superior memory formation and behavioral change.   4. Obesity with current BMI of 38.2 [name] had labs with Rheumatology doctor last week, labs have been requested.  She has a history of pancreatitis, she denies history of thyroid cancer or Multiple endocrine neoplasia 1 or 2.  [name] will call renal doctor to see if they are ok with her starting [name].  [name] has weight loss medication coverage.   [name] is currently in the action stage of change. As such, her goal is to continue with weight loss efforts. She has agreed to the Category 1 Plan with breakfast and lunch options.   Exercise  goals:  Start walking.  Behavioral modification strategies: increasing lean protein intake and planning for success.  [name] has agreed to follow-up with our clinic in 2-3 weeks. She was informed of the importance of frequent follow-up visits to maximize her success with intensive lifestyle modifications for her multiple health conditions.   Objective:   Blood pressure 118/84, pulse 68, temperature 98 F (36.7 C), height '5\' 7"'$  (1.702 m), weight 244 lb (110.7 kg), SpO2 98 %. Body mass index is 38.22 kg/m.  General: Cooperative, alert, well developed, in no  acute distress. HEENT: Conjunctivae and lids unremarkable. Cardiovascular: Regular rhythm.  Lungs: Normal work of breathing. Neurologic: No focal deficits.   Lab Results  Component Value Date   CREATININE 1.2 (A) 06/19/2021   BUN 26 (H) 04/03/2021   NA 143 06/19/2021   K 4.1 04/03/2021   CL 103 04/03/2021   CO2 29 04/03/2021   Lab Results  Component Value Date   ALT 13 06/19/2021   AST 20 06/19/2021   ALKPHOS 66 02/26/2021   BILITOT 0.7 02/26/2021   Lab Results  Component Value Date   HGBA1C 5.2 02/25/2021   HGBA1C 5.6 01/31/2017   Lab Results  Component Value Date   INSULIN 17.2 04/09/2021   Lab Results  Component Value Date   TSH 1.60 04/03/2021   Lab Results  Component Value Date   CHOL 110 02/25/2021   HDL 44 02/25/2021   LDLCALC 59 02/25/2021   LDLDIRECT 321.8 02/27/2012   TRIG 36 02/25/2021   CHOLHDL 2.5 02/25/2021   Lab Results  Component Value Date   VD25OH 17.2 (L) 04/09/2021   Lab Results  Component Value Date   WBC 3.3 06/19/2021   HGB 13.9 06/19/2021   HCT 41 06/19/2021   MCV 100.8 (H) 02/26/2021   PLT 189 06/19/2021   Lab Results  Component Value Date   IRON 386 06/23/2017   TIBC 285 06/23/2017   FERRITIN 58 06/23/2017    Obesity Behavioral Intervention:   Approximately 15 minutes were spent on the discussion below.  ASK: We discussed the diagnosis of obesity with [name] today and [name] agreed to give [name] permission to discuss obesity behavioral modification therapy today.  ASSESS: [name] has the diagnosis of obesity and her BMI today is 38.3. [name] is in the action stage of change.   ADVISE: [name] was educated on the multiple health risks of obesity as well as the benefit of weight loss to improve her health. She was advised of the need for long term treatment and the importance of lifestyle modifications to improve her current health and to decrease her risk of future health problems.  AGREE: Multiple dietary modification  options and treatment options were discussed and Emera agreed to follow the recommendations documented in the above note.  ARRANGE: [name] was educated on the importance of frequent visits to treat obesity as outlined per CMS and USPSTF guidelines and agreed to schedule her next follow up appointment today.  Attestation Statements:   Reviewed by clinician on day of visit: allergies, medications, problem list, medical history, surgical history, family history, social history, and previous encounter notes.  I, Davy Pique, RMA, am acting as Location manager for [employer], DO.  I have reviewed the above documentation for accuracy and completeness, and I agree with the above. Marjory Sneddon, D.O.  The [place] was signed into law in 2016 which includes the topic of electronic health records.  This provides immediate access to information  in [name].  This includes consultation notes, operative notes, office notes, lab results and pathology reports.  If you have any questions about what you read please let us know at your next visit so we can discuss your concerns and take corrective action if need be.  We are right here with you.

## 2021-08-28 ENCOUNTER — Encounter (INDEPENDENT_AMBULATORY_CARE_PROVIDER_SITE_OTHER): Payer: Self-pay | Admitting: Family Medicine

## 2021-08-28 NOTE — Telephone Encounter (Signed)
Please advise 

## 2021-09-03 ENCOUNTER — Ambulatory Visit (INDEPENDENT_AMBULATORY_CARE_PROVIDER_SITE_OTHER): Payer: BC Managed Care – PPO | Admitting: Family Medicine

## 2021-09-03 ENCOUNTER — Encounter (INDEPENDENT_AMBULATORY_CARE_PROVIDER_SITE_OTHER): Payer: Self-pay | Admitting: Family Medicine

## 2021-09-03 ENCOUNTER — Telehealth: Payer: Self-pay | Admitting: Physical Medicine and Rehabilitation

## 2021-09-03 VITALS — BP 99/67 | HR 66 | Temp 98.0°F | Ht 67.0 in | Wt 243.0 lb

## 2021-09-03 DIAGNOSIS — E559 Vitamin D deficiency, unspecified: Secondary | ICD-10-CM | POA: Diagnosis not present

## 2021-09-03 DIAGNOSIS — E669 Obesity, unspecified: Secondary | ICD-10-CM

## 2021-09-03 DIAGNOSIS — Z6841 Body Mass Index (BMI) 40.0 and over, adult: Secondary | ICD-10-CM

## 2021-09-03 DIAGNOSIS — Z6838 Body mass index (BMI) 38.0-38.9, adult: Secondary | ICD-10-CM | POA: Diagnosis not present

## 2021-09-03 MED ORDER — VITAMIN D (ERGOCALCIFEROL) 1.25 MG (50000 UNIT) PO CAPS: 50000.0000 [IU] | ORAL_CAPSULE | ORAL | 0 refills | Status: DC

## 2021-09-03 MED ORDER — SEMAGLUTIDE-WEIGHT MANAGEMENT 0.25 MG/0.5ML ~~LOC~~ SOAJ
0.2500 mg | SUBCUTANEOUS | 0 refills | Status: DC
Start: 2021-09-03 — End: 2021-09-23

## 2021-09-03 NOTE — Telephone Encounter (Signed)
Pt called requesting a call back to set an appt. Please call pt at [phone].

## 2021-09-04 ENCOUNTER — Other Ambulatory Visit: Payer: Self-pay | Admitting: Internal Medicine

## 2021-09-04 DIAGNOSIS — R600 Localized edema: Secondary | ICD-10-CM

## 2021-09-04 DIAGNOSIS — I10 Essential (primary) hypertension: Secondary | ICD-10-CM

## 2021-09-05 ENCOUNTER — Telehealth: Payer: Self-pay | Admitting: Physical Medicine and Rehabilitation

## 2021-09-05 NOTE — Telephone Encounter (Signed)
Pt called requesting a call back about her upcoming appt. Please call pt at [phone].

## 2021-09-09 NOTE — Progress Notes (Signed)
Chief Complaint:   OBESITY [name] is here to discuss her progress with her obesity treatment plan along with follow-up of her obesity related diagnoses. [name] is on the Category 1 Plan with breakfast and lunch options and states she is following her eating plan approximately 100% of the time. [name] states she is not currently exercising.  Today's visit was #: 9 Starting weight: 258 lbs Starting date: 04/09/2021 Today's weight: 243 lbs Today's date: 09/03/2021 Total lbs lost to date: 15 Total lbs lost since last in-office visit: 1  Interim History: [name] is here for a follow up office visit.  We reviewed her meal plan and all questions were answered.  Patient's food recall appears to be accurate and consistent with what is on plan when she is following it.   When eating on plan, her hunger and cravings are well controlled.   Pt did great with losing fat mass and gaining muscle mass. However, she is frustrated with not losing weight faster. Pt really wants to go on Semaglutide injections.  Subjective:   1. Vitamin D deficiency She is currently taking prescription vitamin D 50,000 IU each week. She denies nausea, vomiting or muscle weakness.  2. Obesity current BMI 38.2 Pt has no personal history of pancreatitis, no family history of Medullary Thyroid cancer, and no family history of MEN I or II.  Assessment/Plan:  No orders of the defined types were placed in this encounter.   Medications Discontinued During This Encounter  Medication Reason   Vitamin D, Ergocalciferol, (DRISDOL) 1.25 MG (50000 UNIT) CAPS capsule Reorder     Meds ordered this encounter  Medications   Vitamin D, Ergocalciferol, (DRISDOL) 1.25 MG (50000 UNIT) CAPS capsule    Sig: Take 1 capsule (50,000 Units total) by mouth every 7 (seven) days.    Dispense:  4 capsule    Refill:  0    30 d supply;  ** OV for RF **   Do not send RF request   Semaglutide-Weight Management 0.25 MG/0.5ML SOAJ     Sig: Inject 0.25 mg into the skin once a week. Each Thursday    Dispense:  2 mL    Refill:  0     1. Vitamin D deficiency Low Vitamin D level contributes to fatigue and are associated with obesity, breast, and colon cancer. She agrees to continue to take prescription Vitamin D '@50'$ ,000 IU every week and will follow-up for routine testing of Vitamin D, at least 2-3 times per year to avoid over-replacement.  Start- Vitamin D, Ergocalciferol, (DRISDOL) 1.25 MG (50000 UNIT) CAPS capsule; Take 1 capsule (50,000 Units total) by mouth every 7 (seven) days.  Dispense: 4 capsule; Refill: 0  2. Obesity current BMI 38.2 [name] is currently in the action stage of change. As such, her goal is to continue with weight loss efforts. She has agreed to the Category 1 Plan with breakfast and lunch options.   I explained the importance of losing fat mass and maintaining muscle mass as best she could. Advised pt that we are very happy with her progress.  We discussed various medication options to help [name] with her weight loss efforts and we both agreed to [facility] at low dose of 0.25 mg every Thursday. Extensive discussion regarding risks and benefits of medication. Pt desires medication despite risks of side effects.  Start- Semaglutide-Weight Management 0.25 MG/0.5ML SOAJ; Inject 0.25 mg into the skin once a week. Each Thursday  Dispense: 2 mL; Refill:  0  Exercise goals: All adults should avoid inactivity. Some physical activity is better than none, and adults who participate in any amount of physical activity gain some health benefits.  Behavioral modification strategies: increasing lean protein intake and decreasing simple carbohydrates.  [name] has agreed to follow-up with our clinic in 2-3 weeks. She was informed of the importance of frequent follow-up visits to maximize her success with intensive lifestyle modifications for her multiple health conditions.   Objective:   Blood pressure 99/67, pulse  66, temperature 98 F (36.7 C), height '5\' 7"'$  (1.702 m), weight 243 lb (110.2 kg), SpO2 97 %. Body mass index is 38.06 kg/m.  General: Cooperative, alert, well developed, in no acute distress. HEENT: Conjunctivae and lids unremarkable. Cardiovascular: Regular rhythm.  Lungs: Normal work of breathing. Neurologic: No focal deficits.   Lab Results  Component Value Date   CREATININE 1.2 (A) 06/19/2021   BUN 26 (H) 04/03/2021   NA 143 06/19/2021   K 4.1 04/03/2021   CL 103 04/03/2021   CO2 29 04/03/2021   Lab Results  Component Value Date   ALT 13 06/19/2021   AST 20 06/19/2021   ALKPHOS 66 02/26/2021   BILITOT 0.7 02/26/2021   Lab Results  Component Value Date   HGBA1C 5.2 02/25/2021   HGBA1C 5.6 01/31/2017   Lab Results  Component Value Date   INSULIN 17.2 04/09/2021   Lab Results  Component Value Date   TSH 1.60 04/03/2021   Lab Results  Component Value Date   CHOL 110 02/25/2021   HDL 44 02/25/2021   LDLCALC 59 02/25/2021   LDLDIRECT 321.8 02/27/2012   TRIG 36 02/25/2021   CHOLHDL 2.5 02/25/2021   Lab Results  Component Value Date   VD25OH 17.2 (L) 04/09/2021   Lab Results  Component Value Date   WBC 3.3 06/19/2021   HGB 13.9 06/19/2021   HCT 41 06/19/2021   MCV 100.8 (H) 02/26/2021   PLT 189 06/19/2021   Lab Results  Component Value Date   IRON 386 06/23/2017   TIBC 285 06/23/2017   FERRITIN 58 06/23/2017    Obesity Behavioral Intervention:   Approximately 15 minutes were spent on the discussion below.  ASK: We discussed the diagnosis of obesity with [name] today and [name] agreed to give [name] permission to discuss obesity behavioral modification therapy today.  ASSESS: [name] has the diagnosis of obesity and her BMI today is 38.2. [name] is in the action stage of change.   ADVISE: [name] was educated on the multiple health risks of obesity as well as the benefit of weight loss to improve her health. She was advised of the need for long term  treatment and the importance of lifestyle modifications to improve her current health and to decrease her risk of future health problems.  AGREE: Multiple dietary modification options and treatment options were discussed and [name] agreed to follow the recommendations documented in the above note.  ARRANGE: [name] was educated on the importance of frequent visits to treat obesity as outlined per CMS and USPSTF guidelines and agreed to schedule her next follow up appointment today.  Attestation Statements:   Reviewed by clinician on day of visit: allergies, medications, problem list, medical history, surgical history, family history, social history, and previous encounter notes.  I, Kathlene November, BS, CMA, am acting as transcriptionist for [employer], DO.  I have reviewed the above documentation for accuracy and completeness, and I agree with the above. [name], D.O.  The [place] was signed into law in 2016 which includes the topic of electronic health records.  This provides immediate access to information in MyChart.  This includes consultation notes, operative notes, office notes, lab results and pathology reports.  If you have any questions about what you read please let us know at your next visit so we can discuss your concerns and take corrective action if need be.  We are right here with you.

## 2021-09-10 ENCOUNTER — Encounter (INDEPENDENT_AMBULATORY_CARE_PROVIDER_SITE_OTHER): Payer: Self-pay

## 2021-09-10 ENCOUNTER — Ambulatory Visit (INDEPENDENT_AMBULATORY_CARE_PROVIDER_SITE_OTHER): Payer: BC Managed Care – PPO | Admitting: Adult Health

## 2021-09-10 ENCOUNTER — Telehealth (INDEPENDENT_AMBULATORY_CARE_PROVIDER_SITE_OTHER): Payer: Self-pay | Admitting: Family Medicine

## 2021-09-10 ENCOUNTER — Ambulatory Visit: Payer: BC Managed Care – PPO | Admitting: Physical Medicine and Rehabilitation

## 2021-09-10 NOTE — Telephone Encounter (Signed)
Dr. Raliegh Scarlet - Prior authorization approved for [id number]. Effective: 09/04/2021 to 04/06/2022. Patient sent approval message via mychart.

## 2021-09-11 ENCOUNTER — Encounter: Payer: Self-pay | Admitting: Physical Medicine and Rehabilitation

## 2021-09-11 ENCOUNTER — Ambulatory Visit (INDEPENDENT_AMBULATORY_CARE_PROVIDER_SITE_OTHER): Payer: BC Managed Care – PPO | Admitting: Physical Medicine and Rehabilitation

## 2021-09-11 VITALS — BP 109/72 | HR 74

## 2021-09-11 DIAGNOSIS — M545 Low back pain, unspecified: Secondary | ICD-10-CM | POA: Diagnosis not present

## 2021-09-11 DIAGNOSIS — M47816 Spondylosis without myelopathy or radiculopathy, lumbar region: Secondary | ICD-10-CM

## 2021-09-11 DIAGNOSIS — I25709 Atherosclerosis of coronary artery bypass graft(s), unspecified, with unspecified angina pectoris: Secondary | ICD-10-CM

## 2021-09-11 DIAGNOSIS — G8929 Other chronic pain: Secondary | ICD-10-CM | POA: Diagnosis not present

## 2021-09-11 NOTE — Progress Notes (Signed)
Pt state lower back pain that travels down both legs. Pt state walking makes the pain worse. Pt state she takes over the counter pain meds to help ease her pain.  Numeric Pain Rating Scale and Functional Assessment Average Pain 10 Pain Right Now 8 My pain is constant, tingling, and aching Pain is worse with: walking Pain improves with: medication and injections   In the last MONTH (on 0-10 scale) has pain interfered with the following?  1. General activity like being  able to carry out your everyday physical activities such as walking, climbing stairs, carrying groceries, or moving a chair?  Rating(5)  2. Relation with others like being able to carry out your usual social activities and roles such as  activities at home, at work and in your community. Rating(6)  3. Enjoyment of life such that you have  been bothered by emotional problems such as feeling anxious, depressed or irritable?  Rating(7)

## 2021-09-11 NOTE — Progress Notes (Unsigned)
[name] - 66 y.o. female MRN [id number]  Date of birth: 1955-07-22  Office Visit Note: Visit Date: 09/11/2021 PCP: Janith Lima, MD Referred by: Janith Lima, MD  Subjective: Chief Complaint  Patient presents with   Lower Back - Pain   Left Leg - Pain   Right Leg - Pain   HPI: [name] is a 66 y.o. female who comes in today for evaluation of chronic, worsening and severe bilateral lower back pain. Patient reports pain has been ongoing for several years and is exacerbated by moving from sitting to standing position and standing, describes pain as constant sore and aching sensation, currently rates as 8 out of 10. She reports some relief of pain with home exercise regimen, rest and use of medications. Patient recently completed regimen of formal physical therapy at [facility], states short term relief of pain with these treatments. Patient's lumbar MRI from 2021 exhibits multi-level facet hypertrophy, most severe at L4-L5. There is improved mass effect on left S1 nerve root compared with the prior study from 2018.No high grade spinal canal stenosis noted. Patient had bilateral L4-L5 facet joint injections/medial branch blocks on 05/31/2020 and 07/30/2020, she reports significant and sustained pain relief with these injections until recently. No history of radiofrequency ablation. Patient denies focal weakness, numbness and tingling. Patient denies recent trauma or falls.      Review of Systems  Musculoskeletal:  Positive for back pain.  Neurological:  Negative for tingling, sensory change, focal weakness and weakness.  All other systems reviewed and are negative.  Otherwise per HPI.  Assessment & Plan: Visit Diagnoses:    ICD-10-CM   1. Chronic bilateral low back pain without sciatica  M54.50    G89.29     2. Spondylosis without myelopathy or radiculopathy, lumbar region  M47.816     3. Facet arthropathy, lumbar  M47.816         Plan: Findings:  Chronic, worsening and severe bilateral lower back pain. No radicular symptoms.  Patient continues to have severe pain despite good conservative therapy such as formal physical therapy, home exercise regimen, rest and use of medications.  We believe the next step is to repeat bilateral L4-L5 facet joint/medial branch blocks under fluoroscopic guidance.  If these facet injections provide her with significant and lasting relief we are able to repeat these infrequently as needed.  If her pain relief is short-lived we would consider possibility of longer sustained pain relief with radiofrequency ablation procedure. I did discuss radiofrequency ablation procedure with patient today and also provided her with educational material regarding this procedure to take home and review. Patient is agreeable with plan and has no questions at this time. No red flag symptoms noted upon exam today.     Meds & Orders: No orders of the defined types were placed in this encounter.  No orders of the defined types were placed in this encounter.   Follow-up: Return for Bilateral L4-L5 facet joint/medial branch blocks.   Procedures: No procedures performed      Clinical History: EXAM: MRI LUMBAR SPINE WITHOUT CONTRAST   TECHNIQUE: Multiplanar, multisequence MR imaging of the lumbar spine was performed. No intravenous contrast was administered.   COMPARISON:  Lumbar MRI 08/31/2016   FINDINGS: Segmentation:  Normal   Alignment:  Mild retrolisthesis L1-2, L2-3, L3-4.   Vertebrae: Negative for fracture or mass. Discogenic changes on the left at L5-S1 have progressed in the interval.   Conus medullaris  and cauda equina: Conus extends to the L1-2 level. Conus and cauda equina appear normal.   Paraspinal and other soft tissues: Negative for paraspinous mass, adenopathy, or fluid collection.   Disc levels:   L1-2: Mild degenerative change.  Negative for stenosis   L2-3: Mild facet  degeneration.  Negative for stenosis   L3-4: Mild to moderate facet degeneration. Mild disc bulging. No significant stenosis.   L4-5: Severe facet degeneration bilaterally with facet joint overgrowth and bilateral facet joint effusions. This is similar to the prior study. There is diffuse bulging of the disc. Mild spinal stenosis and moderate subarticular stenosis similar to the prior study.   L5-S1: Progressive disc degeneration and disc space narrowing. Sclerotic endplate changes and spurring on the left have progressed since the prior study. There is bilateral facet hypertrophy. There is moderate subarticular stenosis on the left which has progressed in the interval. Left paracentral disc protrusion shows significant interval improvement. There is improved mass effect on left S1 nerve root compared with the prior study.   IMPRESSION: Mild spinal stenosis and moderate subarticular stenosis bilaterally L4-5 unchanged. Severe facet degeneration L4-5   Improvement in left-sided disc protrusion L5-S1 with less mass effect on the S1 nerve root. There is progressive disc degeneration and spurring on the left at L5-S1 with progression of subarticular stenosis on the left due to spurring.     Electronically Signed   By: Franchot Gallo M.D.   On: 10/13/2019 15:05   She reports that she quit smoking about 5 years ago. Her smoking use included cigarettes. She started smoking about 35 years ago. She has a 30.00 pack-year smoking history. She has never used smokeless tobacco.  Recent Labs    02/25/21 0930  HGBA1C 5.2    Objective:  VS:  HT:    WT:   BMI:     BP:109/72  HR:74bpm  TEMP: ( )  RESP:  Physical Exam Vitals and nursing note reviewed.  HENT:     Head: Normocephalic and atraumatic.     Right Ear: External ear normal.     Left Ear: External ear normal.     Nose: Nose normal.     Mouth/Throat:     Mouth: Mucous membranes are moist.  Eyes:     Pupils: Pupils are  equal, round, and reactive to light.  Cardiovascular:     Rate and Rhythm: Normal rate.     Pulses: Normal pulses.  Pulmonary:     Effort: Pulmonary effort is normal.  Abdominal:     General: Abdomen is flat. There is no distension.  Musculoskeletal:        General: Tenderness present.     Cervical back: Normal range of motion.     Comments: Pt is slow to rise from seated position to standing. Concordant low back pain with facet loading, lumbar spine extension and rotation. Strong distal strength without clonus, no pain upon palpation of greater trochanters. Sensation intact bilaterally. Walks independently, gait steady.   Skin:    General: Skin is warm and dry.     Capillary Refill: Capillary refill takes less than 2 seconds.  Neurological:     General: No focal deficit present.     Mental Status: She is alert and oriented to person, place, and time.  Psychiatric:        Mood and Affect: Mood normal.        Behavior: Behavior normal.     Ortho Exam  Imaging: No results found.  Past Medical/Family/Surgical/Social History: Medications & Allergies reviewed per EMR, new medications updated. Patient Active Problem List   Diagnosis Date Noted   Hypoglycemia 08/06/2021   Vitamin D deficiency 06/17/2021   Need for shingles vaccine 04/08/2021   Encounter for general adult medical examination with abnormal findings 04/08/2021   Chronic heart failure with preserved ejection fraction ([place]) 04/03/2021   Hyperlipidemia LDL goal <70 04/03/2021   Screen for colon cancer 04/03/2021   Systemic lupus erythematosus ([place]) 02/25/2021   Stage 3a chronic kidney disease ([place])    Hypertension 08/08/2020   Chronic idiopathic constipation 05/31/2020   Class 3 severe obesity with serious comorbidity and body mass index (BMI) of 40.0 to 44.9 in adult [facility]) 05/31/2020   Postoperative atrial fibrillation ([place]) 02/18/2017   Osteoarthritis of spine with radiculopathy, lumbar region 06/24/2016    Hypothyroidism 04/08/2011   Coronary artery disease involving coronary bypass graft of native heart with angina pectoris ([place]) 02/26/2009   Gastroesophageal reflux disease 08/05/2007   Systemic lupus erythematosus with focal and segmental proliferative glomerulonephritis ([place]) 08/05/2007   Past Medical History:  Diagnosis Date   Back pain    Bilateral swelling of feet and ankles    CAD (coronary artery disease)    a. remote stents/angioplasty. b. s/p CABG 01/2017 with mitral valve annuloplasty.   Chest pain    Constipation    Diastolic heart failure (HCC)    Esophageal reflux    Gallbladder problem    GERD (gastroesophageal reflux disease)    History of MI (myocardial infarction)    Hyperlipidemia    Hypertension    Hypothyroidism    Immune thrombocytopenic purpura ([place])    Joint pain    Kidney problem    Lupus erythematosus    Mitral valve insufficiency and aortic valve insufficiency    Myocardial infarction (HCC)    x 2   Other fatigue    PONV (postoperative nausea and vomiting)    Postoperative atrial fibrillation (HCC)    Renal disease    Severe mitral regurgitation    a. s/p MV annuloplasty 01/2017 at time of CABG.   Shortness of breath on exertion    Sleep apnea    Status post mitral valve annuloplasty    Unspecified disease of pericardium    Family History  Problem Relation Age of Onset   Heart disease Mother    Hyperlipidemia Mother    Cancer Mother        breast and cervical   Paget's disease of bone Mother    Fibromyalgia Mother    Breast cancer Mother    Hypertension Father    Heart disease Father        PVD   GER disease Father    Breast cancer Sister    Heart attack Maternal Grandmother    Cancer Paternal Grandfather        colon   Past Surgical History:  Procedure Laterality Date   CHOLECYSTECTOMY, LAPAROSCOPIC  2010   CORONARY ARTERY BYPASS GRAFT N/A 01/30/2017   Procedure: CORONARY ARTERY BYPASS GRAFTING (CABG), Times four  using the right  saphaneous vein, harvested endoscopicly.  and left internal mammary artery .;  Surgeon: Gaye Pollack, MD;  Location: [facility] OR;  Service: Open Heart Surgery;  Laterality: N/A;   LEFT HEART CATH AND CORONARY ANGIOGRAPHY N/A 01/23/2017   Procedure: LEFT HEART CATH AND CORONARY ANGIOGRAPHY;  Surgeon: Belva Crome, MD;  Location: [facility];  Service: Cardiovascular;  Laterality: N/A;   MITRAL VALVE  REPAIR N/A 01/30/2017   Procedure: MITRAL VALVE REPAIR (MVR);  Surgeon: Gaye Pollack, MD;  Location: [place];  Service: Open Heart Surgery;  Laterality: N/A;   plastic surgical repair      dog bite to leg   RIGHT/LEFT HEART CATH AND CORONARY/GRAFT ANGIOGRAPHY N/A 02/26/2021   Procedure: RIGHT/LEFT HEART CATH AND CORONARY/GRAFT ANGIOGRAPHY;  Surgeon: Belva Crome, MD;  Location: [place];  Service: Cardiovascular;  Laterality: N/A;   SPLENECTOMY  5-04   TEE WITHOUT CARDIOVERSION N/A 01/27/2017   Procedure: TRANSESOPHAGEAL ECHOCARDIOGRAM (TEE);  Surgeon: Sanda Klein, MD;  Location: [facility];  Service: Cardiovascular;  Laterality: N/A;   TEE WITHOUT CARDIOVERSION N/A 01/30/2017   Procedure: TRANSESOPHAGEAL ECHOCARDIOGRAM (TEE);  Surgeon: Gaye Pollack, MD;  Location: [place];  Service: Open Heart Surgery;  Laterality: N/A;   ULTRASOUND GUIDANCE FOR VASCULAR ACCESS  01/23/2017   Procedure: Ultrasound Guidance For Vascular Access;  Surgeon: Belva Crome, MD;  Location: [place] [facility];  Service: Cardiovascular;;   Social History   Occupational History   OccupationPharmacist, community    Employer: [employer].   Tobacco Use   Smoking status: Former    Packs/day: 1.00    Years: 30.00    Total pack years: 30.00    Types: Cigarettes    Start date: 03/23/1986    Quit date: 12/23/2015    Years since quitting: 5.7   Smokeless tobacco: Never  Vaping Use   Vaping Use: Never used  Substance and Sexual Activity   Alcohol use: No   Drug use: No    Sexual activity: Not Currently    Comment: 1st intercourse 66 yo-More than 5 partners

## 2021-09-17 ENCOUNTER — Other Ambulatory Visit: Payer: Self-pay | Admitting: Physical Medicine and Rehabilitation

## 2021-09-17 DIAGNOSIS — M47816 Spondylosis without myelopathy or radiculopathy, lumbar region: Secondary | ICD-10-CM

## 2021-09-17 DIAGNOSIS — G8929 Other chronic pain: Secondary | ICD-10-CM

## 2021-09-18 ENCOUNTER — Other Ambulatory Visit: Payer: Self-pay | Admitting: Physical Medicine and Rehabilitation

## 2021-09-18 DIAGNOSIS — G8929 Other chronic pain: Secondary | ICD-10-CM

## 2021-09-18 DIAGNOSIS — M545 Low back pain, unspecified: Secondary | ICD-10-CM

## 2021-09-18 DIAGNOSIS — M47816 Spondylosis without myelopathy or radiculopathy, lumbar region: Secondary | ICD-10-CM

## 2021-09-19 ENCOUNTER — Ambulatory Visit (INDEPENDENT_AMBULATORY_CARE_PROVIDER_SITE_OTHER): Payer: Medicare Other | Admitting: Family Medicine

## 2021-09-19 ENCOUNTER — Encounter (INDEPENDENT_AMBULATORY_CARE_PROVIDER_SITE_OTHER): Payer: Self-pay | Admitting: Family Medicine

## 2021-09-22 ENCOUNTER — Other Ambulatory Visit (INDEPENDENT_AMBULATORY_CARE_PROVIDER_SITE_OTHER): Payer: Self-pay | Admitting: Family Medicine

## 2021-09-22 DIAGNOSIS — E559 Vitamin D deficiency, unspecified: Secondary | ICD-10-CM

## 2021-09-23 ENCOUNTER — Ambulatory Visit (INDEPENDENT_AMBULATORY_CARE_PROVIDER_SITE_OTHER): Payer: BC Managed Care – PPO | Admitting: Bariatrics

## 2021-09-23 ENCOUNTER — Other Ambulatory Visit (INDEPENDENT_AMBULATORY_CARE_PROVIDER_SITE_OTHER): Payer: Self-pay

## 2021-09-23 ENCOUNTER — Encounter (INDEPENDENT_AMBULATORY_CARE_PROVIDER_SITE_OTHER): Payer: Self-pay | Admitting: Bariatrics

## 2021-09-23 VITALS — BP 97/64 | HR 63 | Temp 97.6°F | Ht 67.0 in | Wt 244.0 lb

## 2021-09-23 DIAGNOSIS — E785 Hyperlipidemia, unspecified: Secondary | ICD-10-CM | POA: Diagnosis not present

## 2021-09-23 DIAGNOSIS — K5909 Other constipation: Secondary | ICD-10-CM | POA: Diagnosis not present

## 2021-09-23 DIAGNOSIS — E038 Other specified hypothyroidism: Secondary | ICD-10-CM | POA: Diagnosis not present

## 2021-09-23 DIAGNOSIS — Z6838 Body mass index (BMI) 38.0-38.9, adult: Secondary | ICD-10-CM

## 2021-09-23 DIAGNOSIS — E669 Obesity, unspecified: Secondary | ICD-10-CM | POA: Diagnosis not present

## 2021-09-23 MED ORDER — SEMAGLUTIDE-WEIGHT MANAGEMENT 0.25 MG/0.5ML ~~LOC~~ SOAJ: 0.2500 mg | SUBCUTANEOUS | 0 refills | Status: AC

## 2021-09-24 NOTE — Progress Notes (Unsigned)
Chief Complaint:   OBESITY Stefana is here to discuss her progress with her obesity treatment plan along with follow-up of her obesity related diagnoses. Cloey is on the Category 1 Plan with breakfast and lunch options and states she is following her eating plan approximately 100% of the time. Romelia states she is doing 0 minutes 0 times per week.  Today's visit was #: 10 Starting weight: 258 lbs Starting date: 04/09/2021 Today's weight: 244 lbs Today's date: 09/23/2021 Total lbs lost to date: 14 Total lbs lost since last in-office visit: 0  Interim History: Kenadee is up 1 pound since her last visit.  She is doing well with her food and water intake.  She has been constipated.  Subjective:   1. Hyperlipidemia LDL goal <70 Keylen is currently taking Crestor.  2. Other specified hypothyroidism Anmol is currently taking Synthroid.  3. Other constipation Kian is taking Linzess per her GI.  Assessment/Plan:   1. Hyperlipidemia LDL goal <70 Malayjah will continue Crestor as directed.  2. Other specified hypothyroidism Shekia will continue Synthroid as directed.  3. Other constipation Makaiyah will continue Linzess per GI, and she will increase her water and fiber intake.  4. Obesity current BMI 38.3 Anayah is currently in the action stage of change. As such, her goal is to continue with weight loss efforts. She has agreed to the Category 1 Plan.   Meal planning and intentional eating were discussed.  Snack sheet and smart fruit sheet were given.  Exercise goals: No exercise has been prescribed at this time.  Behavioral modification strategies: increasing lean protein intake, decreasing simple carbohydrates, increasing vegetables, increasing water intake, decreasing eating out, no skipping meals, meal planning and cooking strategies, keeping healthy foods in the home, and planning for success.  Cody has agreed to follow-up with our clinic in 3 weeks with Dr. Raliegh Scarlet. She  was informed of the importance of frequent follow-up visits to maximize her success with intensive lifestyle modifications for her multiple health conditions.   Objective:   Blood pressure 97/64, pulse 63, temperature 97.6 F (36.4 C), height '5\' 7"'$  (1.702 m), weight 244 lb (110.7 kg), SpO2 97 %. Body mass index is 38.22 kg/m.  General: Cooperative, alert, well developed, in no acute distress. HEENT: Conjunctivae and lids unremarkable. Cardiovascular: Regular rhythm.  Lungs: Normal work of breathing. Neurologic: No focal deficits.   Lab Results  Component Value Date   CREATININE 1.2 (A) 06/19/2021   BUN 26 (H) 04/03/2021   NA 143 06/19/2021   K 4.1 04/03/2021   CL 103 04/03/2021   CO2 29 04/03/2021   Lab Results  Component Value Date   ALT 13 06/19/2021   AST 20 06/19/2021   ALKPHOS 66 02/26/2021   BILITOT 0.7 02/26/2021   Lab Results  Component Value Date   HGBA1C 5.2 02/25/2021   HGBA1C 5.6 01/31/2017   Lab Results  Component Value Date   INSULIN 17.2 04/09/2021   Lab Results  Component Value Date   TSH 1.60 04/03/2021   Lab Results  Component Value Date   CHOL 110 02/25/2021   HDL 44 02/25/2021   LDLCALC 59 02/25/2021   LDLDIRECT 321.8 02/27/2012   TRIG 36 02/25/2021   CHOLHDL 2.5 02/25/2021   Lab Results  Component Value Date   VD25OH 17.2 (L) 04/09/2021   Lab Results  Component Value Date   WBC 3.3 06/19/2021   HGB 13.9 06/19/2021   HCT 41 06/19/2021   MCV 100.8 (H) 02/26/2021  PLT 189 06/19/2021   Lab Results  Component Value Date   IRON 386 06/23/2017   TIBC 285 06/23/2017   FERRITIN 58 06/23/2017   Attestation Statements:   Reviewed by clinician on day of visit: allergies, medications, problem list, medical history, surgical history, family history, social history, and previous encounter notes.   Wilhemena Durie, am acting as Location manager for [employer], DO.  I have reviewed the above documentation for accuracy and  completeness, and I agree with the above. Jearld Lesch, DO

## 2021-09-25 ENCOUNTER — Encounter (INDEPENDENT_AMBULATORY_CARE_PROVIDER_SITE_OTHER): Payer: Self-pay | Admitting: Bariatrics

## 2021-09-30 ENCOUNTER — Ambulatory Visit
Admission: RE | Admit: 2021-09-30 | Discharge: 2021-09-30 | Disposition: A | Discharge status: Home / Self Care | Payer: BC Managed Care – PPO | Source: Ambulatory Visit | Attending: Physical Medicine and Rehabilitation | Admitting: Physical Medicine and Rehabilitation

## 2021-09-30 ENCOUNTER — Other Ambulatory Visit: Payer: Self-pay | Admitting: Physical Medicine and Rehabilitation

## 2021-09-30 DIAGNOSIS — M545 Low back pain, unspecified: Secondary | ICD-10-CM

## 2021-09-30 DIAGNOSIS — M47816 Spondylosis without myelopathy or radiculopathy, lumbar region: Secondary | ICD-10-CM

## 2021-09-30 DIAGNOSIS — M47812 Spondylosis without myelopathy or radiculopathy, cervical region: Secondary | ICD-10-CM | POA: Diagnosis not present

## 2021-09-30 MED ORDER — METHYLPREDNISOLONE ACETATE 40 MG/ML INJ SUSP (RADIOLOG
80.0000 mg | Freq: Once | INTRAMUSCULAR | Status: AC
  Administered 2021-09-30: 80 mg via INTRALESIONAL

## 2021-09-30 NOTE — Discharge Instructions (Addendum)

## 2021-10-01 DIAGNOSIS — M2011 Hallux valgus (acquired), right foot: Secondary | ICD-10-CM | POA: Diagnosis not present

## 2021-10-01 DIAGNOSIS — M2012 Hallux valgus (acquired), left foot: Secondary | ICD-10-CM | POA: Diagnosis not present

## 2021-10-01 DIAGNOSIS — M722 Plantar fascial fibromatosis: Secondary | ICD-10-CM | POA: Diagnosis not present

## 2021-10-01 DIAGNOSIS — M2042 Other hammer toe(s) (acquired), left foot: Secondary | ICD-10-CM | POA: Diagnosis not present

## 2021-10-01 DIAGNOSIS — M2041 Other hammer toe(s) (acquired), right foot: Secondary | ICD-10-CM | POA: Diagnosis not present

## 2021-10-13 ENCOUNTER — Other Ambulatory Visit (INDEPENDENT_AMBULATORY_CARE_PROVIDER_SITE_OTHER): Payer: Self-pay | Admitting: Family Medicine

## 2021-10-13 DIAGNOSIS — E559 Vitamin D deficiency, unspecified: Secondary | ICD-10-CM

## 2021-10-15 ENCOUNTER — Other Ambulatory Visit: Payer: Self-pay | Admitting: Interventional Cardiology

## 2021-10-16 ENCOUNTER — Other Ambulatory Visit: Payer: Self-pay | Admitting: Internal Medicine

## 2021-10-22 DIAGNOSIS — M722 Plantar fascial fibromatosis: Secondary | ICD-10-CM | POA: Diagnosis not present

## 2021-10-22 DIAGNOSIS — M2011 Hallux valgus (acquired), right foot: Secondary | ICD-10-CM | POA: Diagnosis not present

## 2021-10-22 DIAGNOSIS — M2012 Hallux valgus (acquired), left foot: Secondary | ICD-10-CM | POA: Diagnosis not present

## 2021-10-22 DIAGNOSIS — M2041 Other hammer toe(s) (acquired), right foot: Secondary | ICD-10-CM | POA: Diagnosis not present

## 2021-10-22 DIAGNOSIS — M2042 Other hammer toe(s) (acquired), left foot: Secondary | ICD-10-CM | POA: Diagnosis not present

## 2021-10-24 ENCOUNTER — Encounter (INDEPENDENT_AMBULATORY_CARE_PROVIDER_SITE_OTHER): Payer: Self-pay | Admitting: Family Medicine

## 2021-10-24 ENCOUNTER — Ambulatory Visit (INDEPENDENT_AMBULATORY_CARE_PROVIDER_SITE_OTHER): Payer: BC Managed Care – PPO | Admitting: Family Medicine

## 2021-10-24 VITALS — BP 117/83 | HR 65 | Temp 97.7°F | Ht 67.0 in | Wt 238.0 lb

## 2021-10-24 DIAGNOSIS — I25709 Atherosclerosis of coronary artery bypass graft(s), unspecified, with unspecified angina pectoris: Secondary | ICD-10-CM

## 2021-10-24 DIAGNOSIS — E669 Obesity, unspecified: Secondary | ICD-10-CM

## 2021-10-24 DIAGNOSIS — E8881 Metabolic syndrome: Secondary | ICD-10-CM

## 2021-10-24 DIAGNOSIS — Z6837 Body mass index (BMI) 37.0-37.9, adult: Secondary | ICD-10-CM | POA: Diagnosis not present

## 2021-10-24 DIAGNOSIS — E559 Vitamin D deficiency, unspecified: Secondary | ICD-10-CM | POA: Diagnosis not present

## 2021-10-24 MED ORDER — VITAMIN D (ERGOCALCIFEROL) 1.25 MG (50000 UNIT) PO CAPS: 50000.0000 [IU] | ORAL_CAPSULE | ORAL | 0 refills | Status: DC

## 2021-10-30 ENCOUNTER — Encounter (INDEPENDENT_AMBULATORY_CARE_PROVIDER_SITE_OTHER): Payer: Self-pay

## 2021-11-01 NOTE — Progress Notes (Unsigned)
Chief Complaint:   OBESITY [name] is here to discuss her progress with her obesity treatment plan along with follow-up of her obesity related diagnoses. [name] is on the Category 1 Plan and states she is following her eating plan approximately 100% of the time. [name] states she is walking 15-20 minutes 2 times per week.  Today's visit was #: 11 Starting weight: 258 lbs Starting date: 04/09/2021 Today's weight: 238 lbs Today's date: 10/24/2021 Total lbs lost to date: 20 Total lbs lost since last in-office visit: 6  Interim History: [name] went to [place] on vacation and walked a lot. That is the only difference, as she had no change in foods.  Subjective:   1. Insulin resistance [name] started [place] approximately 4 weeks ago. She took 1 injection and within 1-2 days, and severe stomach cramping and blood in her stool.  2. Vitamin D deficiency She is currently taking prescription vitamin D 50,000 IU each week. She denies nausea, vomiting or muscle weakness.  Assessment/Plan:  No orders of the defined types were placed in this encounter.   Medications Discontinued During This Encounter  Medication Reason   Vitamin D, Ergocalciferol, (DRISDOL) 1.25 MG (50000 UNIT) CAPS capsule Reorder     Meds ordered this encounter  Medications   Vitamin D, Ergocalciferol, (DRISDOL) 1.25 MG (50000 UNIT) CAPS capsule    Sig: Take 1 capsule (50,000 Units total) by mouth every 7 (seven) days.    Dispense:  4 capsule    Refill:  0    30 d supply;  ** OV for RF **   Do not send RF request     1. Insulin resistance [name] will continue to work on weight loss, exercise, and decreasing simple carbohydrates to help decrease the risk of diabetes. [name] agreed to follow-up with [name] as directed to closely monitor her progress. Discontinue Wegovy due to intolerance. If blood persists or reoccurs, follow up with PCP. Check labs at next OV.  2. Vitamin D deficiency Low Vitamin D level contributes to  fatigue and are associated with obesity, breast, and colon cancer. She agrees to continue to take prescription Vitamin D '@50'$ ,000 IU every week and will follow-up for routine testing of Vitamin D, at least 2-3 times per year to avoid over-replacement. Check labs at next OV.  Refill- Vitamin D, Ergocalciferol, (DRISDOL) 1.25 MG (50000 UNIT) CAPS capsule; Take 1 capsule (50,000 Units total) by mouth every 7 (seven) days.  Dispense: 4 capsule; Refill: 0  3. Obesity current BMI 37.4 [name] is currently in the action stage of change. As such, her goal is to continue with weight loss efforts. She has agreed to the Category 1 Plan.   Pt's goal is 30 minutes of exercise 3 days a week.  Exercise goals:  As is  Behavioral modification strategies: planning for success.  [name] has agreed to follow-up with our clinic in 3-4 weeks, fasting for blood work. She was informed of the importance of frequent follow-up visits to maximize her success with intensive lifestyle modifications for her multiple health conditions.   Objective:   Blood pressure 117/83, pulse 65, temperature 97.7 F (36.5 C), height '5\' 7"'$  (1.702 m), weight 238 lb (108 kg), SpO2 97 %. Body mass index is 37.28 kg/m.  General: Cooperative, alert, well developed, in no acute distress. HEENT: Conjunctivae and lids unremarkable. Cardiovascular: Regular rhythm.  Lungs: Normal work of breathing. Neurologic: No focal deficits.   Lab Results  Component Value Date   CREATININE 1.2 (A)  06/19/2021   BUN 26 (H) 04/03/2021   NA 143 06/19/2021   K 4.1 04/03/2021   CL 103 04/03/2021   CO2 29 04/03/2021   Lab Results  Component Value Date   ALT 13 06/19/2021   AST 20 06/19/2021   ALKPHOS 66 02/26/2021   BILITOT 0.7 02/26/2021   Lab Results  Component Value Date   HGBA1C 5.2 02/25/2021   HGBA1C 5.6 01/31/2017   Lab Results  Component Value Date   INSULIN 17.2 04/09/2021   Lab Results  Component Value Date   TSH 1.60 04/03/2021    Lab Results  Component Value Date   CHOL 110 02/25/2021   HDL 44 02/25/2021   LDLCALC 59 02/25/2021   LDLDIRECT 321.8 02/27/2012   TRIG 36 02/25/2021   CHOLHDL 2.5 02/25/2021   Lab Results  Component Value Date   VD25OH 17.2 (L) 04/09/2021   Lab Results  Component Value Date   WBC 3.3 06/19/2021   HGB 13.9 06/19/2021   HCT 41 06/19/2021   MCV 100.8 (H) 02/26/2021   PLT 189 06/19/2021   Lab Results  Component Value Date   IRON 386 06/23/2017   TIBC 285 06/23/2017   FERRITIN 58 06/23/2017    Obesity Behavioral Intervention:   Approximately 15 minutes were spent on the discussion below.  ASK: We discussed the diagnosis of obesity with [name] today and [name] agreed to give [name] permission to discuss obesity behavioral modification therapy today.  ASSESS: [name] has the diagnosis of obesity and her BMI today is 37.4. [name] is in the action stage of change.   ADVISE: [name] was educated on the multiple health risks of obesity as well as the benefit of weight loss to improve her health. She was advised of the need for long term treatment and the importance of lifestyle modifications to improve her current health and to decrease her risk of future health problems.  AGREE: Multiple dietary modification options and treatment options were discussed and [name] agreed to follow the recommendations documented in the above note.  ARRANGE: [name] was educated on the importance of frequent visits to treat obesity as outlined per CMS and USPSTF guidelines and agreed to schedule her next follow up appointment today.  Attestation Statements:   Reviewed by clinician on day of visit: allergies, medications, problem list, medical history, surgical history, family history, social history, and previous encounter notes.  I, Kathlene November, BS, CMA, am acting as transcriptionist for [employer], DO.  I have reviewed the above documentation for accuracy and completeness, and I agree  with the above. Marjory Sneddon, D.O.  The [place] was signed into law in 2016 which includes the topic of electronic health records.  This provides immediate access to information in MyChart.  This includes consultation notes, operative notes, office notes, lab results and pathology reports.  If you have any questions about what you read please let us know at your next visit so we can discuss your concerns and take corrective action if need be.  We are right here with you.

## 2021-11-10 ENCOUNTER — Other Ambulatory Visit: Payer: Self-pay | Admitting: Internal Medicine

## 2021-11-10 DIAGNOSIS — E039 Hypothyroidism, unspecified: Secondary | ICD-10-CM

## 2021-11-13 ENCOUNTER — Ambulatory Visit (INDEPENDENT_AMBULATORY_CARE_PROVIDER_SITE_OTHER): Payer: BC Managed Care – PPO | Admitting: Internal Medicine

## 2021-11-13 ENCOUNTER — Encounter: Payer: Self-pay | Admitting: Internal Medicine

## 2021-11-13 VITALS — BP 124/84 | HR 61 | Temp 97.7°F | Ht 67.0 in | Wt 244.0 lb

## 2021-11-13 DIAGNOSIS — Z23 Encounter for immunization: Secondary | ICD-10-CM | POA: Diagnosis not present

## 2021-11-13 DIAGNOSIS — I10 Essential (primary) hypertension: Secondary | ICD-10-CM

## 2021-11-13 DIAGNOSIS — N1831 Chronic kidney disease, stage 3a: Secondary | ICD-10-CM

## 2021-11-13 DIAGNOSIS — I25709 Atherosclerosis of coronary artery bypass graft(s), unspecified, with unspecified angina pectoris: Secondary | ICD-10-CM

## 2021-11-13 DIAGNOSIS — E039 Hypothyroidism, unspecified: Secondary | ICD-10-CM

## 2021-11-13 MED ORDER — LEVOTHYROXINE SODIUM 75 MCG PO TABS: 75.0000 ug | ORAL_TABLET | Freq: Every day | ORAL | 1 refills | Status: DC

## 2021-11-13 NOTE — Patient Instructions (Signed)

## 2021-11-13 NOTE — Progress Notes (Signed)
Subjective:  Patient ID: [name], female    DOB: 07-23-1955  Age: 66 y.o. MRN: [id number]  CC: Hypothyroidism   HPI Ambrie Carte presents for f/up -  She walks nearly every day and does not experience chest pain, shortness of breath, diaphoresis, dizziness, lightheadedness, or edema.  Outpatient Medications Prior to Visit  Medication Sig Dispense Refill   albuterol (VENTOLIN HFA) 108 (90 Base) MCG/ACT inhaler Inhale 2 puffs into the lungs every 6 (six) hours as needed for wheezing or shortness of breath. 8 g 0   aspirin 81 MG chewable tablet Chew 1 tablet (81 mg total) by mouth daily.     azaTHIOprine (IMURAN) 50 MG tablet TAKE 1 TABLET (50 MG) BY MOUTH IN THE MORNING AND AT BEDTIME 180 tablet 0   cyclobenzaprine (FLEXERIL) 5 MG tablet Take 1 tablet (5 mg total) by mouth 3 (three) times daily as needed. 30 tablet 0   dapagliflozin propanediol (FARXIGA) 10 MG TABS tablet Take 1 tablet (10 mg total) by mouth daily. 90 tablet 2   furosemide (LASIX) 20 MG tablet Take 1 tablet (20 mg total) by mouth daily. 90 tablet 2   hydroxychloroquine (PLAQUENIL) 200 MG tablet Take 200 mg by mouth daily.      indapamide (LOZOL) 1.25 MG tablet TAKE 1 TABLET BY MOUTH EVERY DAY 90 tablet 0   isosorbide mononitrate (IMDUR) 30 MG 24 hr tablet Take 1 tablet (30 mg total) by mouth daily. 90 tablet 2   metoprolol succinate (TOPROL-XL) 50 MG 24 hr tablet TAKE 1 TABLET (50 MG TOTAL) BY MOUTH DAILY. KEEP FOLLOW UP FOR FUTURE REFILLS. (Patient taking differently: Take 50 mg by mouth daily.) 90 tablet 3   nitroGLYCERIN (NITROSTAT) 0.4 MG SL tablet Place 1 tablet (0.4 mg total) under the tongue every 5 (five) minutes as needed for chest pain. 30 tablet 0   oxyCODONE (OXY IR/ROXICODONE) 5 MG immediate release tablet Take 1 tablet (5 mg total) by mouth every 4 (four) hours as needed for moderate pain. 5 tablet 0   pantoprazole (PROTONIX) 40 MG tablet TAKE 1 TABLET BY MOUTH EVERY DAY 90 tablet 0    potassium chloride SA (KLOR-CON M) 20 MEQ tablet Take 1 tablet (20 mEq total) by mouth daily. 30 tablet 0   pregabalin (LYRICA) 50 MG capsule Take 50 mg by mouth 2 (two) times daily.     rosuvastatin (CRESTOR) 20 MG tablet Take 1 tablet (20 mg total) by mouth daily. 90 tablet 3   Vitamin D, Ergocalciferol, (DRISDOL) 1.25 MG (50000 UNIT) CAPS capsule Take 1 capsule (50,000 Units total) by mouth every 7 (seven) days. 4 capsule 0   diphenhydrAMINE (BENADRYL) 25 MG tablet Take 1 tablet (25 mg total) by mouth every 6 (six) hours as needed. 30 tablet 0   levothyroxine (SYNTHROID) 75 MCG tablet Take 1 tablet (75 mcg total) by mouth daily before breakfast. 90 tablet 1   No facility-administered medications prior to visit.    ROS Review of Systems  Constitutional:  Negative for diaphoresis and fatigue.  HENT: Negative.    Eyes: Negative.   Respiratory:  Negative for cough, chest tightness and wheezing.   Cardiovascular:  Negative for chest pain, palpitations and leg swelling.  Gastrointestinal:  Negative for abdominal pain, diarrhea, nausea and vomiting.  Endocrine: Negative.  Negative for cold intolerance and heat intolerance.  Genitourinary: Negative.  Negative for difficulty urinating.  Musculoskeletal: Negative.  Negative for arthralgias and myalgias.  Skin: Negative.   Neurological: Negative.  Negative for dizziness and light-headedness.  Hematological:  Negative for adenopathy. Does not bruise/bleed easily.  Psychiatric/Behavioral: Negative.      Objective:  BP 124/84 (BP Location: Left Arm, Patient Position: Sitting, Cuff Size: Large)   Pulse 61   Temp 97.7 F (36.5 C) (Oral)   Ht '5\' 7"'$  (1.702 m)   Wt 244 lb (110.7 kg)   SpO2 93%   BMI 38.22 kg/m   BP Readings from Last 3 Encounters:  11/13/21 124/84  10/24/21 117/83  09/30/21 118/68    Wt Readings from Last 3 Encounters:  11/13/21 244 lb (110.7 kg)  10/24/21 238 lb (108 kg)  09/23/21 244 lb (110.7 kg)    Physical  Exam Vitals reviewed.  Constitutional:      Appearance: She is not ill-appearing.  HENT:     Mouth/Throat:     Mouth: Mucous membranes are moist.  Eyes:     General: No scleral icterus.    Conjunctiva/sclera: Conjunctivae normal.  Cardiovascular:     Rate and Rhythm: Normal rate and regular rhythm.     Heart sounds: S1 normal and S2 normal. Murmur heard.     Systolic murmur is present with a grade of 1/6.     No friction rub. No gallop.  Pulmonary:     Effort: Pulmonary effort is normal.     Breath sounds: No stridor. No wheezing, rhonchi or rales.  Abdominal:     General: Abdomen is flat.     Palpations: There is no mass.     Tenderness: There is no abdominal tenderness. There is no guarding.     Hernia: No hernia is present.  Musculoskeletal:     Cervical back: Neck supple.     Right lower leg: No edema.     Left lower leg: No edema.  Lymphadenopathy:     Cervical: No cervical adenopathy.  Skin:    General: Skin is warm and dry.  Neurological:     General: No focal deficit present.     Mental Status: Mental status is at baseline.  Psychiatric:        Mood and Affect: Mood normal.        Behavior: Behavior normal.     Lab Results  Component Value Date   WBC 3.3 06/19/2021   HGB 13.9 06/19/2021   HCT 41 06/19/2021   PLT 189 06/19/2021   GLUCOSE 82 04/03/2021   CHOL 110 02/25/2021   TRIG 36 02/25/2021   HDL 44 02/25/2021   LDLDIRECT 321.8 02/27/2012   LDLCALC 59 02/25/2021   ALT 13 06/19/2021   AST 20 06/19/2021   NA 139 08/13/2021   K 4.4 08/13/2021   CL 105 08/13/2021   CREATININE 1.2 (A) 08/13/2021   BUN 29 (A) 08/13/2021   CO2 26 (A) 08/13/2021   TSH 1.57 08/13/2021   INR 1.09 07/01/2017   HGBA1C 5.2 02/25/2021    DG FACET JT INJ L /S SINGLE LEVEL LEFT W/FL/CT  Result Date: 09/30/2021 CLINICAL DATA:  Facet mediated low back pain. Previously sustained pain relief after bilateral L4-L5 facet medial branch blocks with steroid in March 2022 and  anesthetic only in May 2022, both at an outside facility. Repeat injection requested. Patient has discussed RFA as well. EXAM: FLUOROSCOPICALLY GUIDED BILATERAL L3 AND L4 MEDIAL BRANCH BLOCKS FLUOROSCOPY TIME:  Radiation Exposure Index (as provided by the fluoroscopic device): 17.7 mGy Kerma TECHNIQUE: The procedure, risks, benefits, and alternatives were explained to the patient. Questions regarding the procedure were  encouraged and answered. The patient understands and consents to the procedure. An appropriate skin entry site was determined fluoroscopically and marked. Site prepped with betadine, draped in usual sterile fashion, and infiltrated locally with 1% lidocaine. BILATERAL L3 MEDIAL BRANCH BLOCKS: A posterior oblique approach was taken to the junction of the superior articular process and transverse process on each side at L4 using 5 inch 22 gauge spinal needles, to lie along the course of the bilateral L3 medial branch nerves. 20 mg Depo-Medrol mixed with 0.5 mL of 0.5% bupivacaine were injected on each side. BILATERAL L4 MEDIAL BRANCH BLOCKS: A posterior oblique approach was taken to the junction of the superior articular process and transverse process on each side at L5 using 5 inch 22 gauge spinal needles, to lie along the course of the bilateral L4 medial branch nerves. 20 mg Depo-Medrol mixed with 0.5 mL of 0.5% bupivacaine were injected on each side. The procedure was well-tolerated. IMPRESSION: 1. Technically successful bilateral L4-L5 facet medial branch blocks. Electronically Signed   By: Titus Dubin M.D.   On: 09/30/2021 09:05   DG FACET JT INJ L /S SINGLE LEVEL RIGHT W/FL/CT  Result Date: 09/30/2021 CLINICAL DATA:  Facet mediated low back pain. Previously sustained pain relief after bilateral L4-L5 facet medial branch blocks with steroid in March 2022 and anesthetic only in May 2022, both at an outside facility. Repeat injection requested. Patient has discussed RFA as well. EXAM:  FLUOROSCOPICALLY GUIDED BILATERAL L3 AND L4 MEDIAL BRANCH BLOCKS FLUOROSCOPY TIME:  Radiation Exposure Index (as provided by the fluoroscopic device): 17.7 mGy Kerma TECHNIQUE: The procedure, risks, benefits, and alternatives were explained to the patient. Questions regarding the procedure were encouraged and answered. The patient understands and consents to the procedure. An appropriate skin entry site was determined fluoroscopically and marked. Site prepped with betadine, draped in usual sterile fashion, and infiltrated locally with 1% lidocaine. BILATERAL L3 MEDIAL BRANCH BLOCKS: A posterior oblique approach was taken to the junction of the superior articular process and transverse process on each side at L4 using 5 inch 22 gauge spinal needles, to lie along the course of the bilateral L3 medial branch nerves. 20 mg Depo-Medrol mixed with 0.5 mL of 0.5% bupivacaine were injected on each side. BILATERAL L4 MEDIAL BRANCH BLOCKS: A posterior oblique approach was taken to the junction of the superior articular process and transverse process on each side at L5 using 5 inch 22 gauge spinal needles, to lie along the course of the bilateral L4 medial branch nerves. 20 mg Depo-Medrol mixed with 0.5 mL of 0.5% bupivacaine were injected on each side. The procedure was well-tolerated. IMPRESSION: 1. Technically successful bilateral L4-L5 facet medial branch blocks. Electronically Signed   By: Titus Dubin M.D.   On: 09/30/2021 09:05   DG FACET JT INJ L /S 2ND LEVEL RIGHT W/FL/CT  Result Date: 09/30/2021 CLINICAL DATA:  Facet mediated low back pain. Previously sustained pain relief after bilateral L4-L5 facet medial branch blocks with steroid in March 2022 and anesthetic only in May 2022, both at an outside facility. Repeat injection requested. Patient has discussed RFA as well. EXAM: FLUOROSCOPICALLY GUIDED BILATERAL L3 AND L4 MEDIAL BRANCH BLOCKS FLUOROSCOPY TIME:  Radiation Exposure Index (as provided by the  fluoroscopic device): 17.7 mGy Kerma TECHNIQUE: The procedure, risks, benefits, and alternatives were explained to the patient. Questions regarding the procedure were encouraged and answered. The patient understands and consents to the procedure. An appropriate skin entry site was determined fluoroscopically and marked. Site  prepped with betadine, draped in usual sterile fashion, and infiltrated locally with 1% lidocaine. BILATERAL L3 MEDIAL BRANCH BLOCKS: A posterior oblique approach was taken to the junction of the superior articular process and transverse process on each side at L4 using 5 inch 22 gauge spinal needles, to lie along the course of the bilateral L3 medial branch nerves. 20 mg Depo-Medrol mixed with 0.5 mL of 0.5% bupivacaine were injected on each side. BILATERAL L4 MEDIAL BRANCH BLOCKS: A posterior oblique approach was taken to the junction of the superior articular process and transverse process on each side at L5 using 5 inch 22 gauge spinal needles, to lie along the course of the bilateral L4 medial branch nerves. 20 mg Depo-Medrol mixed with 0.5 mL of 0.5% bupivacaine were injected on each side. The procedure was well-tolerated. IMPRESSION: 1. Technically successful bilateral L4-L5 facet medial branch blocks. Electronically Signed   By: Titus Dubin M.D.   On: 09/30/2021 09:05   DG FACET JT INJ L /S  2ND LEVEL LEFT W/FL/CT  Result Date: 09/30/2021 CLINICAL DATA:  Facet mediated low back pain. Previously sustained pain relief after bilateral L4-L5 facet medial branch blocks with steroid in March 2022 and anesthetic only in May 2022, both at an outside facility. Repeat injection requested. Patient has discussed RFA as well. EXAM: FLUOROSCOPICALLY GUIDED BILATERAL L3 AND L4 MEDIAL BRANCH BLOCKS FLUOROSCOPY TIME:  Radiation Exposure Index (as provided by the fluoroscopic device): 17.7 mGy Kerma TECHNIQUE: The procedure, risks, benefits, and alternatives were explained to the patient.  Questions regarding the procedure were encouraged and answered. The patient understands and consents to the procedure. An appropriate skin entry site was determined fluoroscopically and marked. Site prepped with betadine, draped in usual sterile fashion, and infiltrated locally with 1% lidocaine. BILATERAL L3 MEDIAL BRANCH BLOCKS: A posterior oblique approach was taken to the junction of the superior articular process and transverse process on each side at L4 using 5 inch 22 gauge spinal needles, to lie along the course of the bilateral L3 medial branch nerves. 20 mg Depo-Medrol mixed with 0.5 mL of 0.5% bupivacaine were injected on each side. BILATERAL L4 MEDIAL BRANCH BLOCKS: A posterior oblique approach was taken to the junction of the superior articular process and transverse process on each side at L5 using 5 inch 22 gauge spinal needles, to lie along the course of the bilateral L4 medial branch nerves. 20 mg Depo-Medrol mixed with 0.5 mL of 0.5% bupivacaine were injected on each side. The procedure was well-tolerated. IMPRESSION: 1. Technically successful bilateral L4-L5 facet medial branch blocks. Electronically Signed   By: Titus Dubin M.D.   On: 09/30/2021 09:05    Assessment & Plan:   Belisa was seen today for hypothyroidism.  Diagnoses and all orders for this visit:  Hypertension, unspecified type- Her blood pressure is adequately well controlled.  Acquired hypothyroidism- She is euthyroid.  Will continue to current T4 dosage. -     levothyroxine (SYNTHROID) 75 MCG tablet; Take 1 tablet (75 mcg total) by mouth daily before breakfast.  Stage 3a chronic kidney disease ([place])- Her renal function is stable.  She is avoiding nephrotoxic agents.  Other orders -     Zoster Recombinant (Shingrix )   I have discontinued Nurah F. Friedel's diphenhydrAMINE. I am also having her maintain her hydroxychloroquine, metoprolol succinate, albuterol, cyclobenzaprine, aspirin, oxyCODONE, nitroGLYCERIN,  potassium chloride SA, rosuvastatin, dapagliflozin propanediol, furosemide, isosorbide mononitrate, pregabalin, indapamide, pantoprazole, azaTHIOprine, Vitamin D (Ergocalciferol), and levothyroxine.  Meds ordered this encounter  Medications   levothyroxine (SYNTHROID) 75 MCG tablet    Sig: Take 1 tablet (75 mcg total) by mouth daily before breakfast.    Dispense:  90 tablet    Refill:  1    DX Code Needed  .     Follow-up: Return in about 6 months (around 05/16/2022).  Scarlette Calico, MD

## 2021-11-14 DIAGNOSIS — M2041 Other hammer toe(s) (acquired), right foot: Secondary | ICD-10-CM | POA: Diagnosis not present

## 2021-11-14 DIAGNOSIS — M2042 Other hammer toe(s) (acquired), left foot: Secondary | ICD-10-CM | POA: Diagnosis not present

## 2021-11-14 DIAGNOSIS — M722 Plantar fascial fibromatosis: Secondary | ICD-10-CM | POA: Diagnosis not present

## 2021-11-14 DIAGNOSIS — M2012 Hallux valgus (acquired), left foot: Secondary | ICD-10-CM | POA: Diagnosis not present

## 2021-11-14 DIAGNOSIS — M2011 Hallux valgus (acquired), right foot: Secondary | ICD-10-CM | POA: Diagnosis not present

## 2021-11-20 ENCOUNTER — Other Ambulatory Visit (INDEPENDENT_AMBULATORY_CARE_PROVIDER_SITE_OTHER): Payer: Self-pay | Admitting: Family Medicine

## 2021-11-20 DIAGNOSIS — E559 Vitamin D deficiency, unspecified: Secondary | ICD-10-CM

## 2021-12-02 ENCOUNTER — Other Ambulatory Visit (INDEPENDENT_AMBULATORY_CARE_PROVIDER_SITE_OTHER): Payer: Self-pay | Admitting: Family Medicine

## 2021-12-02 ENCOUNTER — Encounter (INDEPENDENT_AMBULATORY_CARE_PROVIDER_SITE_OTHER): Payer: Self-pay | Admitting: Family Medicine

## 2021-12-02 ENCOUNTER — Ambulatory Visit (INDEPENDENT_AMBULATORY_CARE_PROVIDER_SITE_OTHER): Payer: BC Managed Care – PPO | Admitting: Family Medicine

## 2021-12-02 VITALS — BP 117/83 | HR 64 | Temp 98.0°F | Ht 67.0 in | Wt 237.0 lb

## 2021-12-02 DIAGNOSIS — E785 Hyperlipidemia, unspecified: Secondary | ICD-10-CM | POA: Diagnosis not present

## 2021-12-02 DIAGNOSIS — E669 Obesity, unspecified: Secondary | ICD-10-CM

## 2021-12-02 DIAGNOSIS — Z6837 Body mass index (BMI) 37.0-37.9, adult: Secondary | ICD-10-CM | POA: Diagnosis not present

## 2021-12-02 DIAGNOSIS — E559 Vitamin D deficiency, unspecified: Secondary | ICD-10-CM

## 2021-12-02 DIAGNOSIS — E8881 Metabolic syndrome: Secondary | ICD-10-CM

## 2021-12-02 DIAGNOSIS — E7849 Other hyperlipidemia: Secondary | ICD-10-CM | POA: Diagnosis not present

## 2021-12-02 DIAGNOSIS — Z6841 Body Mass Index (BMI) 40.0 and over, adult: Secondary | ICD-10-CM

## 2021-12-02 MED ORDER — VITAMIN D (ERGOCALCIFEROL) 1.25 MG (50000 UNIT) PO CAPS: 50000.0000 [IU] | ORAL_CAPSULE | ORAL | 0 refills | Status: DC

## 2021-12-03 LAB — LIPID PANEL
Chol/HDL Ratio: 3 ratio (ref 0.0–4.4)
Cholesterol, Total: 134 mg/dL (ref 100–199)
HDL: 45 mg/dL (ref >39)
LDL Chol Calc (NIH): 76 mg/dL (ref 0–99)
Triglycerides: 60 mg/dL (ref 0–149)
VLDL Cholesterol Cal: 13 mg/dL (ref 5–40)

## 2021-12-03 LAB — INSULIN, RANDOM: INSULIN: 8 u[IU]/mL (ref 2.6–24.9)

## 2021-12-03 LAB — HEMOGLOBIN A1C
Est. average glucose Bld gHb Est-mCnc: 108 mg/dL
Hgb A1c MFr Bld: 5.4 % (ref 4.8–5.6)

## 2021-12-03 LAB — VITAMIN D 25 HYDROXY (VIT D DEFICIENCY, FRACTURES): Vit D, 25-Hydroxy: 43.8 ng/mL (ref 30.0–100.0)

## 2021-12-06 NOTE — Progress Notes (Signed)
Chief Complaint:   OBESITY [name] is here to discuss her progress with her obesity treatment plan along with follow-up of her obesity related diagnoses. [name] is on the Category 1 Plan with breakfast and lunch options and states she is following her eating plan approximately 90% of the time. [name] states she is doing cardio and weight training 45 minutes 3 times per week.  Today's visit was #: 12 Starting weight: 258 lbs Starting date: 04/09/2021 Today's weight: 237 lbs Today's date: 12/02/2021 Total lbs lost to date: 21 Total lbs lost since last in-office visit: 1  Interim History: [name] was on vacation for 2 weeks and still lost a pound.   Subjective:   1. Insulin resistance [name] denies hunger or cravings. Goal is HgbA1c < 5.7, fasting insulin closer to 5.    2. Vitamin D deficiency She is currently taking prescription vitamin D 50,000 IU each week. She denies nausea, vomiting or muscle weakness.  3. Other hyperlipidemia [name] has hyperlipidemia and has been trying to improve her cholesterol levels with intensive lifestyle modifications including a low saturated fat diet, exercise, and weight loss. She denies any chest pain, claudication, or myalgias. Medication: Crestor 20 mg per PCP   Assessment/Plan:   Orders Placed This Encounter  Procedures   VITAMIN D 25 Hydroxy (Vit-D Deficiency, Fractures)   Lipid panel   Insulin, random   Hemoglobin A1c    Medications Discontinued During This Encounter  Medication Reason   Vitamin D, Ergocalciferol, (DRISDOL) 1.25 MG (50000 UNIT) CAPS capsule Reorder     Meds ordered this encounter  Medications   Vitamin D, Ergocalciferol, (DRISDOL) 1.25 MG (50000 UNIT) CAPS capsule    Sig: Take 1 capsule (50,000 Units total) by mouth every 7 (seven) days.    Dispense:  4 capsule    Refill:  0    30 d supply;  ** OV for RF **   Do not send RF request     1. Insulin resistance [name] will continue to work on weight loss,  exercise, and decreasing simple carbohydrates to help decrease the risk of diabetes. [name] agreed to follow-up with [name] as directed to closely monitor her progress.  Lab/Orders today or future: - Insulin, random - Hemoglobin A1c  2. Vitamin D deficiency Low Vitamin D level contributes to fatigue and are associated with obesity, breast, and colon cancer. She agrees to continue to take prescription Vitamin D '@50'$ ,000 IU every week and will follow-up for routine testing of Vitamin D, at least 2-3 times per year to avoid over-replacement.  Refill- Vitamin D, Ergocalciferol, (DRISDOL) 1.25 MG (50000 UNIT) CAPS capsule; Take 1 capsule (50,000 Units total) by mouth every 7 (seven) days.  Dispense: 4 capsule; Refill: 0  Lab/Orders today or future: - VITAMIN D 25 Hydroxy (Vit-D Deficiency, Fractures)  3. Other hyperlipidemia Cardiovascular risk and specific lipid/LDL goals reviewed.  We discussed several lifestyle modifications today and [name] will continue to work on diet, exercise and weight loss efforts. Orders and follow up as documented in patient record.   Counseling Intensive lifestyle modifications are the first line treatment for this issue. Dietary changes: Increase soluble fiber. Decrease simple carbohydrates. Exercise changes: Moderate to vigorous-intensity aerobic activity 150 minutes per week if tolerated. Lipid-lowering medications: see documented in medical record.  Lab/Orders today or future: - Lipid panel  4. Obesity current BMI 37.2 [name] is currently in the action stage of change. As such, her goal is to continue with weight loss efforts. She has  agreed to the Category 1 Plan with breakfast and lunch options.   Pt is in the gym twice weekly now and doing cardio 20-25 minutes and weight lifting 20-25 minutes with trainer. Exercise goals:  As is and increase as tolerated.  Behavioral modification strategies: increasing lean protein intake, decreasing simple carbohydrates,  avoiding temptations, and planning for success.  [name] has agreed to follow-up with our clinic in 4 weeks. She was informed of the importance of frequent follow-up visits to maximize her success with intensive lifestyle modifications for her multiple health conditions.   [name] was informed we would discuss her lab results at her next visit unless there is a critical issue that needs to be addressed sooner. [name] agreed to keep her next visit at the agreed upon time to discuss these results.  Objective:   Blood pressure 117/83, pulse 64, temperature 98 F (36.7 C), height '5\' 7"'$  (1.702 m), weight 237 lb (107.5 kg), SpO2 94 %. Body mass index is 37.12 kg/m.  General: Cooperative, alert, well developed, in no acute distress. HEENT: Conjunctivae and lids unremarkable. Cardiovascular: Regular rhythm.  Lungs: Normal work of breathing. Neurologic: No focal deficits.   Lab Results  Component Value Date   CREATININE 1.2 (A) 08/13/2021   BUN 29 (A) 08/13/2021   NA 139 08/13/2021   K 4.4 08/13/2021   CL 105 08/13/2021   CO2 26 (A) 08/13/2021   Lab Results  Component Value Date   ALT 13 06/19/2021   AST 20 06/19/2021   ALKPHOS 66 02/26/2021   BILITOT 0.7 02/26/2021   Lab Results  Component Value Date   HGBA1C 5.4 12/02/2021   HGBA1C 5.2 02/25/2021   HGBA1C 5.6 01/31/2017   Lab Results  Component Value Date   INSULIN 8.0 12/02/2021   INSULIN 17.2 04/09/2021   Lab Results  Component Value Date   TSH 1.57 08/13/2021   Lab Results  Component Value Date   CHOL 134 12/02/2021   HDL 45 12/02/2021   LDLCALC 76 12/02/2021   LDLDIRECT 321.8 02/27/2012   TRIG 60 12/02/2021   CHOLHDL 3.0 12/02/2021   Lab Results  Component Value Date   VD25OH 43.8 12/02/2021   VD25OH 17.2 (L) 04/09/2021   Lab Results  Component Value Date   WBC 3.3 06/19/2021   HGB 13.9 06/19/2021   HCT 41 06/19/2021   MCV 100.8 (H) 02/26/2021   PLT 189 06/19/2021   Lab Results  Component Value Date    IRON 386 06/23/2017   TIBC 285 06/23/2017   FERRITIN 58 06/23/2017    Obesity Behavioral Intervention:   Approximately 15 minutes were spent on the discussion below.  ASK: We discussed the diagnosis of obesity with [name] today and [name] agreed to give [name] permission to discuss obesity behavioral modification therapy today.  ASSESS: [name] has the diagnosis of obesity and her BMI today is 37.2. [name] is in the action stage of change.   ADVISE: [name] was educated on the multiple health risks of obesity as well as the benefit of weight loss to improve her health. She was advised of the need for long term treatment and the importance of lifestyle modifications to improve her current health and to decrease her risk of future health problems.  AGREE: Multiple dietary modification options and treatment options were discussed and [name] agreed to follow the recommendations documented in the above note.  ARRANGE: [name] was educated on the importance of frequent visits to treat obesity as outlined per CMS and USPSTF guidelines  and agreed to schedule her next follow up appointment today.  Attestation Statements:   Reviewed by clinician on day of visit: allergies, medications, problem list, medical history, surgical history, family history, social history, and previous encounter notes.  I, Kathlene November, BS, CMA, am acting as transcriptionist for [employer], DO.    I have reviewed the above documentation for accuracy and completeness, and I agree with the above. Marjory Sneddon, D.O.  The [place] was signed into law in 2016 which includes the topic of electronic health records.  This provides immediate access to information in MyChart.  This includes consultation notes, operative notes, office notes, lab results and pathology reports.  If you have any questions about what you read please let us know at your next visit so we can discuss your concerns and take  corrective action if need be.  We are right here with you.

## 2021-12-08 ENCOUNTER — Other Ambulatory Visit: Payer: Self-pay | Admitting: Internal Medicine

## 2021-12-08 DIAGNOSIS — R600 Localized edema: Secondary | ICD-10-CM

## 2021-12-08 DIAGNOSIS — I10 Essential (primary) hypertension: Secondary | ICD-10-CM

## 2021-12-18 ENCOUNTER — Other Ambulatory Visit: Payer: Self-pay | Admitting: Interventional Cardiology

## 2021-12-18 MED ORDER — DAPAGLIFLOZIN PROPANEDIOL 10 MG PO TABS: 10.0000 mg | ORAL_TABLET | Freq: Every day | ORAL | 0 refills | Status: DC

## 2021-12-20 ENCOUNTER — Other Ambulatory Visit: Payer: Self-pay | Admitting: Interventional Cardiology

## 2021-12-26 ENCOUNTER — Other Ambulatory Visit (INDEPENDENT_AMBULATORY_CARE_PROVIDER_SITE_OTHER): Payer: Self-pay | Admitting: Family Medicine

## 2021-12-26 DIAGNOSIS — E559 Vitamin D deficiency, unspecified: Secondary | ICD-10-CM

## 2021-12-30 ENCOUNTER — Other Ambulatory Visit (INDEPENDENT_AMBULATORY_CARE_PROVIDER_SITE_OTHER): Payer: Self-pay | Admitting: Family Medicine

## 2021-12-30 ENCOUNTER — Ambulatory Visit (INDEPENDENT_AMBULATORY_CARE_PROVIDER_SITE_OTHER): Payer: BC Managed Care – PPO | Admitting: Family Medicine

## 2021-12-30 ENCOUNTER — Encounter (INDEPENDENT_AMBULATORY_CARE_PROVIDER_SITE_OTHER): Payer: Self-pay | Admitting: Family Medicine

## 2021-12-30 VITALS — BP 115/74 | HR 65 | Temp 97.9°F | Ht 67.0 in | Wt 236.0 lb

## 2021-12-30 DIAGNOSIS — E559 Vitamin D deficiency, unspecified: Secondary | ICD-10-CM | POA: Diagnosis not present

## 2021-12-30 DIAGNOSIS — Z6837 Body mass index (BMI) 37.0-37.9, adult: Secondary | ICD-10-CM | POA: Diagnosis not present

## 2021-12-30 DIAGNOSIS — E669 Obesity, unspecified: Secondary | ICD-10-CM | POA: Diagnosis not present

## 2021-12-30 DIAGNOSIS — Z9189 Other specified personal risk factors, not elsewhere classified: Secondary | ICD-10-CM

## 2021-12-30 MED ORDER — VITAMIN D (ERGOCALCIFEROL) 1.25 MG (50000 UNIT) PO CAPS: 50000.0000 [IU] | ORAL_CAPSULE | ORAL | 0 refills | Status: DC

## 2022-01-06 NOTE — Progress Notes (Unsigned)
Chief Complaint:   OBESITY [name] is here to discuss her progress with her obesity treatment plan along with follow-up of her obesity related diagnoses. [name] is on the Category 1 Plan with breakfast and lunch options and states she is following her eating plan approximately 90-95% of the time. [name] states she is doing cardio and weights 45 minutes 2 times per week.  Today's visit was #: 69 Starting weight: 258 lbs Starting date: 04/09/2021 Today's weight: 236 lbs Today's date: 12/30/2021 Total lbs lost to date: 22 Total lbs lost since last in-office visit: 1  Interim History: "I'm concerned I only lost 1 lb." Pt lost fat mass and gained in muscle mass versus last OV. Pt is tearful today despite doing very well with her weight loss. She has no time to exercise more.  Subjective:   1. Vitamin D deficiency [name] is tolerating medication(s) well without side effects.  Medication compliance is good as patient endorses taking it as prescribed.  The patient denies additional concerns regarding this condition.      2. At risk for depression Alec is at risk for depression due to frustrations with her eight loss journey, feeling inadequate.  Assessment/Plan:  No orders of the defined types were placed in this encounter.   Medications Discontinued During This Encounter  Medication Reason   Vitamin D, Ergocalciferol, (DRISDOL) 1.25 MG (50000 UNIT) CAPS capsule Reorder     Meds ordered this encounter  Medications   Vitamin D, Ergocalciferol, (DRISDOL) 1.25 MG (50000 UNIT) CAPS capsule    Sig: Take 1 capsule (50,000 Units total) by mouth every 7 (seven) days.    Dispense:  4 capsule    Refill:  0    30 d supply;  ** OV for RF **   Do not send RF request     1. Vitamin D deficiency - I again reiterated the importance of vitamin D (as well as calcium) to their health and wellbeing.  - I reviewed possible symptoms of low Vitamin D:  low energy, depressed mood,  muscle aches, joint aches, osteoporosis etc. - low Vitamin D levels may be linked to an increased risk of cardiovascular events and even increased risk of cancers- such as colon and breast.  - ideal vitamin D levels reviewed with patient  - I recommend pt take a 50,000 IU weekly prescription vit D - see script below   - Informed patient this may be a lifelong thing, and she was encouraged to continue to take the medicine until told otherwise.    - weight loss will likely improve availability of vitamin D, thus encouraged [name] to continue with meal plan and their weight loss efforts to further improve this condition.  Thus, we will need to monitor levels regularly (every 3-4 mo on average) to keep levels within normal limits and prevent over supplementation. - pt's questions and concerns regarding this condition addressed.  Refill- Vitamin D, Ergocalciferol, (DRISDOL) 1.25 MG (50000 UNIT) CAPS capsule; Take 1 capsule (50,000 Units total) by mouth every 7 (seven) days.  Dispense: 4 capsule; Refill: 0  2. At risk for depression [name] was given approximately 9 minutes of depression prevention counseling today due to their higher than average risk for this condition. The patient has several risk factors for depression such as chronic medical conditions, sleep issues, major life stressors/events, etc., and we discussed these today.  [name] was also counseled on the importance of a healthy work-life balance,  a healthy relationship with food, and a good support system.  We discussed various strategies to help cope with these emotions as well.  I recommended counseling, meditation or prayer, healthy eating habits, sleep hygiene, and exercising to help manage these feelings.   3. Obesity current BMI 37 [name] is currently in the action stage of change. As such, her goal is to continue with weight loss efforts. She has agreed to the Category 1 Plan with breakfast and lunch options and  keeping a food journal and adhering to recommended goals of 1000 calories and 90+ grams protein.   Recommend pt: Get stricter with caloric/food intake and measure and weigh dinner and lunch proteins and/or veggies. Increase days per week of exercise.  Exercise goals: For substantial health benefits, adults should do at least 150 minutes (2 hours and 30 minutes) a week of moderate-intensity, or 75 minutes (1 hour and 15 minutes) a week of vigorous-intensity aerobic physical activity, or an equivalent combination of moderate- and vigorous-intensity aerobic activity. Aerobic activity should be performed in episodes of at least 10 minutes, and preferably, it should be spread throughout the week.  Behavioral modification strategies: decreasing simple carbohydrates and planning for success.  [name] has agreed to follow-up with our clinic in 3-4 weeks. She was informed of the importance of frequent follow-up visits to maximize her success with intensive lifestyle modifications for her multiple health conditions.   Objective:   Blood pressure 115/74, pulse 65, temperature 97.9 F (36.6 C), height '5\' 7"'$  (1.702 m), weight 236 lb (107 kg), SpO2 98 %. Body mass index is 36.96 kg/m.  General: Cooperative, alert, well developed, in no acute distress. HEENT: Conjunctivae and lids unremarkable. Cardiovascular: Regular rhythm.  Lungs: Normal work of breathing. Neurologic: No focal deficits.   Lab Results  Component Value Date   CREATININE 1.2 (A) 08/13/2021   BUN 29 (A) 08/13/2021   NA 139 08/13/2021   K 4.4 08/13/2021   CL 105 08/13/2021   CO2 26 (A) 08/13/2021   Lab Results  Component Value Date   ALT 13 06/19/2021   AST 20 06/19/2021   ALKPHOS 66 02/26/2021   BILITOT 0.7 02/26/2021   Lab Results  Component Value Date   HGBA1C 5.4 12/02/2021   HGBA1C 5.2 02/25/2021   HGBA1C 5.6 01/31/2017   Lab Results  Component Value Date   INSULIN 8.0 12/02/2021   INSULIN 17.2 04/09/2021   Lab  Results  Component Value Date   TSH 1.57 08/13/2021   Lab Results  Component Value Date   CHOL 134 12/02/2021   HDL 45 12/02/2021   LDLCALC 76 12/02/2021   LDLDIRECT 321.8 02/27/2012   TRIG 60 12/02/2021   CHOLHDL 3.0 12/02/2021   Lab Results  Component Value Date   VD25OH 43.8 12/02/2021   VD25OH 17.2 (L) 04/09/2021   Lab Results  Component Value Date   WBC 3.3 06/19/2021   HGB 13.9 06/19/2021   HCT 41 06/19/2021   MCV 100.8 (H) 02/26/2021   PLT 189 06/19/2021   Lab Results  Component Value Date   IRON 386 06/23/2017   TIBC 285 06/23/2017   FERRITIN 58 06/23/2017   Attestation Statements:   Reviewed by clinician on day of visit: allergies, medications, problem list, medical history, surgical history, family history, social history, and previous encounter notes.  I, Kathlene November, BS, CMA, am acting as transcriptionist for [employer], DO.   I have reviewed the above documentation for accuracy and completeness, and I agree with the  above. Marjory Sneddon, D.O.  The [place] was signed into law in 2016 which includes the topic of electronic health records.  This provides immediate access to information in MyChart.  This includes consultation notes, operative notes, office notes, lab results and pathology reports.  If you have any questions about what you read please let us know at your next visit so we can discuss your concerns and take corrective action if need be.  We are right here with you.

## 2022-01-08 ENCOUNTER — Ambulatory Visit: Payer: BC Managed Care – PPO

## 2022-01-11 ENCOUNTER — Other Ambulatory Visit: Payer: Self-pay | Admitting: Interventional Cardiology

## 2022-01-12 DIAGNOSIS — Z23 Encounter for immunization: Secondary | ICD-10-CM | POA: Diagnosis not present

## 2022-01-13 ENCOUNTER — Encounter: Payer: Self-pay | Admitting: Interventional Cardiology

## 2022-01-13 MED ORDER — DAPAGLIFLOZIN PROPANEDIOL 10 MG PO TABS: 10.0000 mg | ORAL_TABLET | Freq: Every day | ORAL | 0 refills | Status: DC

## 2022-01-18 ENCOUNTER — Other Ambulatory Visit: Payer: Self-pay | Admitting: Interventional Cardiology

## 2022-01-23 ENCOUNTER — Other Ambulatory Visit: Payer: Self-pay | Admitting: Internal Medicine

## 2022-01-24 ENCOUNTER — Encounter: Payer: Self-pay | Admitting: Internal Medicine

## 2022-01-24 ENCOUNTER — Ambulatory Visit (INDEPENDENT_AMBULATORY_CARE_PROVIDER_SITE_OTHER): Payer: BC Managed Care – PPO

## 2022-01-24 ENCOUNTER — Ambulatory Visit (INDEPENDENT_AMBULATORY_CARE_PROVIDER_SITE_OTHER): Payer: BC Managed Care – PPO | Admitting: Internal Medicine

## 2022-01-24 VITALS — BP 134/82 | HR 63 | Temp 98.1°F | Ht 67.0 in | Wt 238.0 lb

## 2022-01-24 DIAGNOSIS — R051 Acute cough: Secondary | ICD-10-CM | POA: Diagnosis not present

## 2022-01-24 DIAGNOSIS — J22 Unspecified acute lower respiratory infection: Secondary | ICD-10-CM | POA: Diagnosis not present

## 2022-01-24 DIAGNOSIS — R059 Cough, unspecified: Secondary | ICD-10-CM | POA: Diagnosis not present

## 2022-01-24 DIAGNOSIS — I25709 Atherosclerosis of coronary artery bypass graft(s), unspecified, with unspecified angina pectoris: Secondary | ICD-10-CM

## 2022-01-24 LAB — POC COVID19 BINAXNOW: SARS Coronavirus 2 Ag: NEGATIVE

## 2022-01-24 LAB — POCT INFLUENZA A/B
Influenza A, POC: NEGATIVE
Influenza B, POC: NEGATIVE

## 2022-01-24 MED ORDER — AZITHROMYCIN 500 MG PO TABS: 500.0000 mg | ORAL_TABLET | Freq: Every day | ORAL | 0 refills | Status: AC

## 2022-01-24 MED ORDER — PROMETHAZINE-DM 6.25-15 MG/5ML PO SYRP: 5.0000 mL | ORAL_SOLUTION | Freq: Four times a day (QID) | ORAL | 0 refills | Status: AC | PRN

## 2022-01-24 NOTE — Patient Instructions (Signed)

## 2022-01-24 NOTE — Progress Notes (Signed)
Subjective:  Patient ID: [name], female    DOB: 22-Sep-1955  Age: 66 y.o. MRN: [id number]  CC: URI   HPI Jose Alleyne presents for f/up -  She complains of a 3-day history of facial pain, sore throat, cough productive of yellow phlegm, substernal chest pain that she describes as a dull ache, nausea but no vomiting, sneezing, and runny nose.  Outpatient Medications Prior to Visit  Medication Sig Dispense Refill   albuterol (VENTOLIN HFA) 108 (90 Base) MCG/ACT inhaler Inhale 2 puffs into the lungs every 6 (six) hours as needed for wheezing or shortness of breath. 8 g 0   aspirin 81 MG chewable tablet Chew 1 tablet (81 mg total) by mouth daily.     azaTHIOprine (IMURAN) 50 MG tablet TAKE 1 TABLET (50 MG) BY MOUTH IN THE MORNING AND AT BEDTIME 180 tablet 0   cyclobenzaprine (FLEXERIL) 5 MG tablet Take 1 tablet (5 mg total) by mouth 3 (three) times daily as needed. 30 tablet 0   dapagliflozin propanediol (FARXIGA) 10 MG TABS tablet Take 1 tablet (10 mg total) by mouth daily. 30 tablet 0   furosemide (LASIX) 20 MG tablet TAKE 1 TABLET BY MOUTH EVERY DAY 90 tablet 0   hydroxychloroquine (PLAQUENIL) 200 MG tablet Take 200 mg by mouth daily.      indapamide (LOZOL) 1.25 MG tablet TAKE 1 TABLET BY MOUTH EVERY DAY 90 tablet 0   isosorbide mononitrate (IMDUR) 30 MG 24 hr tablet TAKE 1 TABLET BY MOUTH EVERY DAY 90 tablet 0   levothyroxine (SYNTHROID) 75 MCG tablet Take 1 tablet (75 mcg total) by mouth daily before breakfast. 90 tablet 1   metoprolol succinate (TOPROL-XL) 50 MG 24 hr tablet TAKE 1 TABLET (50 MG TOTAL) BY MOUTH DAILY. KEEP FOLLOW UP FOR FUTURE REFILLS. (Patient taking differently: Take 50 mg by mouth daily.) 90 tablet 3   nitroGLYCERIN (NITROSTAT) 0.4 MG SL tablet Place 1 tablet (0.4 mg total) under the tongue every 5 (five) minutes as needed for chest pain. 30 tablet 0   oxyCODONE (OXY IR/ROXICODONE) 5 MG immediate release tablet Take 1 tablet (5 mg total) by mouth  every 4 (four) hours as needed for moderate pain. 5 tablet 0   pantoprazole (PROTONIX) 40 MG tablet TAKE 1 TABLET BY MOUTH EVERY DAY 30 tablet 0   potassium chloride SA (KLOR-CON M) 20 MEQ tablet Take 1 tablet (20 mEq total) by mouth daily. 30 tablet 0   pregabalin (LYRICA) 50 MG capsule Take 50 mg by mouth 2 (two) times daily.     rosuvastatin (CRESTOR) 20 MG tablet Take 1 tablet (20 mg total) by mouth daily. 90 tablet 3   Vitamin D, Ergocalciferol, (DRISDOL) 1.25 MG (50000 UNIT) CAPS capsule Take 1 capsule (50,000 Units total) by mouth every 7 (seven) days. 4 capsule 0   No facility-administered medications prior to visit.    ROS Review of Systems  Constitutional:  Negative for chills, diaphoresis, fatigue and fever.  HENT:  Positive for postnasal drip, rhinorrhea, sinus pressure, sinus pain, sneezing and sore throat. Negative for facial swelling and trouble swallowing.   Respiratory:  Positive for cough. Negative for chest tightness, shortness of breath and wheezing.   Cardiovascular:  Positive for chest pain. Negative for palpitations and leg swelling.  Gastrointestinal:  Positive for nausea. Negative for abdominal pain, diarrhea and vomiting.  Genitourinary: Negative.  Negative for difficulty urinating.  Musculoskeletal:  Positive for arthralgias. Negative for myalgias and neck pain.  Skin:  Negative.  Negative for rash.  Neurological:  Positive for headaches. Negative for dizziness, weakness and light-headedness.  Hematological:  Negative for adenopathy. Does not bruise/bleed easily.  Psychiatric/Behavioral: Negative.      Objective:  BP 134/82 (BP Location: Left Arm, Patient Position: Sitting, Cuff Size: Large)   Pulse 63   Temp 98.1 F (36.7 C) (Oral)   Ht '5\' 7"'$  (1.702 m)   Wt 238 lb (108 kg)   SpO2 95%   BMI 37.28 kg/m   BP Readings from Last 3 Encounters:  01/24/22 134/82  12/30/21 115/74  12/02/21 117/83    Wt Readings from Last 3 Encounters:  01/24/22 238 lb (108  kg)  12/30/21 236 lb (107 kg)  12/02/21 237 lb (107.5 kg)    Physical Exam Vitals reviewed.  Constitutional:      Appearance: She is not ill-appearing.  HENT:     Nose: Rhinorrhea present. Rhinorrhea is purulent.     Right Nostril: No epistaxis.     Left Nostril: No epistaxis.     Mouth/Throat:     Mouth: Mucous membranes are moist.  Eyes:     General: No scleral icterus.    Conjunctiva/sclera: Conjunctivae normal.  Cardiovascular:     Rate and Rhythm: Normal rate and regular rhythm.     Heart sounds: No murmur heard. Pulmonary:     Effort: Pulmonary effort is normal.     Breath sounds: No stridor. No wheezing, rhonchi or rales.  Abdominal:     General: Abdomen is flat.     Palpations: There is no mass.     Tenderness: There is no abdominal tenderness. There is no guarding.     Hernia: No hernia is present.  Musculoskeletal:        General: Normal range of motion.     Cervical back: Neck supple.     Right lower leg: No edema.     Left lower leg: No edema.  Lymphadenopathy:     Cervical: No cervical adenopathy.  Skin:    General: Skin is warm and dry.  Neurological:     General: No focal deficit present.     Mental Status: She is alert.  Psychiatric:        Mood and Affect: Mood normal.        Behavior: Behavior normal.     Lab Results  Component Value Date   WBC 3.3 06/19/2021   HGB 13.9 06/19/2021   HCT 41 06/19/2021   PLT 189 06/19/2021   GLUCOSE 82 04/03/2021   CHOL 134 12/02/2021   TRIG 60 12/02/2021   HDL 45 12/02/2021   LDLDIRECT 321.8 02/27/2012   LDLCALC 76 12/02/2021   ALT 13 06/19/2021   AST 20 06/19/2021   NA 139 08/13/2021   K 4.4 08/13/2021   CL 105 08/13/2021   CREATININE 1.2 (A) 08/13/2021   BUN 29 (A) 08/13/2021   CO2 26 (A) 08/13/2021   TSH 1.57 08/13/2021   INR 1.09 07/01/2017   HGBA1C 5.4 12/02/2021    DG FACET JT INJ L /S SINGLE LEVEL LEFT W/FL/CT  Result Date: 09/30/2021 CLINICAL DATA:  Facet mediated low back pain.  Previously sustained pain relief after bilateral L4-L5 facet medial branch blocks with steroid in March 2022 and anesthetic only in May 2022, both at an outside facility. Repeat injection requested. Patient has discussed RFA as well. EXAM: FLUOROSCOPICALLY GUIDED BILATERAL L3 AND L4 MEDIAL BRANCH BLOCKS FLUOROSCOPY TIME:  Radiation Exposure Index (as provided by the fluoroscopic  device): 17.7 mGy Kerma TECHNIQUE: The procedure, risks, benefits, and alternatives were explained to the patient. Questions regarding the procedure were encouraged and answered. The patient understands and consents to the procedure. An appropriate skin entry site was determined fluoroscopically and marked. Site prepped with betadine, draped in usual sterile fashion, and infiltrated locally with 1% lidocaine. BILATERAL L3 MEDIAL BRANCH BLOCKS: A posterior oblique approach was taken to the junction of the superior articular process and transverse process on each side at L4 using 5 inch 22 gauge spinal needles, to lie along the course of the bilateral L3 medial branch nerves. 20 mg Depo-Medrol mixed with 0.5 mL of 0.5% bupivacaine were injected on each side. BILATERAL L4 MEDIAL BRANCH BLOCKS: A posterior oblique approach was taken to the junction of the superior articular process and transverse process on each side at L5 using 5 inch 22 gauge spinal needles, to lie along the course of the bilateral L4 medial branch nerves. 20 mg Depo-Medrol mixed with 0.5 mL of 0.5% bupivacaine were injected on each side. The procedure was well-tolerated. IMPRESSION: 1. Technically successful bilateral L4-L5 facet medial branch blocks. Electronically Signed   By: Titus Dubin M.D.   On: 09/30/2021 09:05   DG FACET JT INJ L /S SINGLE LEVEL RIGHT W/FL/CT  Result Date: 09/30/2021 CLINICAL DATA:  Facet mediated low back pain. Previously sustained pain relief after bilateral L4-L5 facet medial branch blocks with steroid in March 2022 and anesthetic only in  May 2022, both at an outside facility. Repeat injection requested. Patient has discussed RFA as well. EXAM: FLUOROSCOPICALLY GUIDED BILATERAL L3 AND L4 MEDIAL BRANCH BLOCKS FLUOROSCOPY TIME:  Radiation Exposure Index (as provided by the fluoroscopic device): 17.7 mGy Kerma TECHNIQUE: The procedure, risks, benefits, and alternatives were explained to the patient. Questions regarding the procedure were encouraged and answered. The patient understands and consents to the procedure. An appropriate skin entry site was determined fluoroscopically and marked. Site prepped with betadine, draped in usual sterile fashion, and infiltrated locally with 1% lidocaine. BILATERAL L3 MEDIAL BRANCH BLOCKS: A posterior oblique approach was taken to the junction of the superior articular process and transverse process on each side at L4 using 5 inch 22 gauge spinal needles, to lie along the course of the bilateral L3 medial branch nerves. 20 mg Depo-Medrol mixed with 0.5 mL of 0.5% bupivacaine were injected on each side. BILATERAL L4 MEDIAL BRANCH BLOCKS: A posterior oblique approach was taken to the junction of the superior articular process and transverse process on each side at L5 using 5 inch 22 gauge spinal needles, to lie along the course of the bilateral L4 medial branch nerves. 20 mg Depo-Medrol mixed with 0.5 mL of 0.5% bupivacaine were injected on each side. The procedure was well-tolerated. IMPRESSION: 1. Technically successful bilateral L4-L5 facet medial branch blocks. Electronically Signed   By: Titus Dubin M.D.   On: 09/30/2021 09:05   DG FACET JT INJ L /S 2ND LEVEL RIGHT W/FL/CT  Result Date: 09/30/2021 CLINICAL DATA:  Facet mediated low back pain. Previously sustained pain relief after bilateral L4-L5 facet medial branch blocks with steroid in March 2022 and anesthetic only in May 2022, both at an outside facility. Repeat injection requested. Patient has discussed RFA as well. EXAM: FLUOROSCOPICALLY GUIDED  BILATERAL L3 AND L4 MEDIAL BRANCH BLOCKS FLUOROSCOPY TIME:  Radiation Exposure Index (as provided by the fluoroscopic device): 17.7 mGy Kerma TECHNIQUE: The procedure, risks, benefits, and alternatives were explained to the patient. Questions regarding the procedure were encouraged  and answered. The patient understands and consents to the procedure. An appropriate skin entry site was determined fluoroscopically and marked. Site prepped with betadine, draped in usual sterile fashion, and infiltrated locally with 1% lidocaine. BILATERAL L3 MEDIAL BRANCH BLOCKS: A posterior oblique approach was taken to the junction of the superior articular process and transverse process on each side at L4 using 5 inch 22 gauge spinal needles, to lie along the course of the bilateral L3 medial branch nerves. 20 mg Depo-Medrol mixed with 0.5 mL of 0.5% bupivacaine were injected on each side. BILATERAL L4 MEDIAL BRANCH BLOCKS: A posterior oblique approach was taken to the junction of the superior articular process and transverse process on each side at L5 using 5 inch 22 gauge spinal needles, to lie along the course of the bilateral L4 medial branch nerves. 20 mg Depo-Medrol mixed with 0.5 mL of 0.5% bupivacaine were injected on each side. The procedure was well-tolerated. IMPRESSION: 1. Technically successful bilateral L4-L5 facet medial branch blocks. Electronically Signed   By: Titus Dubin M.D.   On: 09/30/2021 09:05   DG FACET JT INJ L /S  2ND LEVEL LEFT W/FL/CT  Result Date: 09/30/2021 CLINICAL DATA:  Facet mediated low back pain. Previously sustained pain relief after bilateral L4-L5 facet medial branch blocks with steroid in March 2022 and anesthetic only in May 2022, both at an outside facility. Repeat injection requested. Patient has discussed RFA as well. EXAM: FLUOROSCOPICALLY GUIDED BILATERAL L3 AND L4 MEDIAL BRANCH BLOCKS FLUOROSCOPY TIME:  Radiation Exposure Index (as provided by the fluoroscopic device): 17.7 mGy  Kerma TECHNIQUE: The procedure, risks, benefits, and alternatives were explained to the patient. Questions regarding the procedure were encouraged and answered. The patient understands and consents to the procedure. An appropriate skin entry site was determined fluoroscopically and marked. Site prepped with betadine, draped in usual sterile fashion, and infiltrated locally with 1% lidocaine. BILATERAL L3 MEDIAL BRANCH BLOCKS: A posterior oblique approach was taken to the junction of the superior articular process and transverse process on each side at L4 using 5 inch 22 gauge spinal needles, to lie along the course of the bilateral L3 medial branch nerves. 20 mg Depo-Medrol mixed with 0.5 mL of 0.5% bupivacaine were injected on each side. BILATERAL L4 MEDIAL BRANCH BLOCKS: A posterior oblique approach was taken to the junction of the superior articular process and transverse process on each side at L5 using 5 inch 22 gauge spinal needles, to lie along the course of the bilateral L4 medial branch nerves. 20 mg Depo-Medrol mixed with 0.5 mL of 0.5% bupivacaine were injected on each side. The procedure was well-tolerated. IMPRESSION: 1. Technically successful bilateral L4-L5 facet medial branch blocks. Electronically Signed   By: Titus Dubin M.D.   On: 09/30/2021 09:05   DG Chest 2 View  Result Date: 01/24/2022 CLINICAL DATA:  Cough for 3 days. EXAM: CHEST - 2 VIEW COMPARISON:  Chest two views 02/24/2021, CT chest 02/25/2021 FINDINGS: Status post median sternotomy and mitral valve replacement. Cardiac silhouette is again mildly enlarged. Mediastinal contours are within normal limits with mild calcification within the aortic arch. The lungs are clear. No pleural effusion or pneumothorax. Minimal multilevel degenerative disc changes of the thoracic spine. Surgical clips overlie the upper abdomen. IMPRESSION: No active cardiopulmonary disease. Electronically Signed   By: Yvonne Kendall M.D.   On: 01/24/2022 11:09      Assessment & Plan:   Leisa was seen today for uri.  Diagnoses and all orders for this  visit:  Acute cough- Her chest x-ray is negative for mass or infiltrate.  COVID and influenza are negative.  Will treat for atypical and typical causes of lower respiratory tract infection. -     DG Chest 2 View; Future -     POC COVID-19 -     POCT Influenza A/B -     promethazine-dextromethorphan (PROMETHAZINE-DM) 6.25-15 MG/5ML syrup; Take 5 mLs by mouth 4 (four) times daily as needed for up to 7 days for cough.  LRTI (lower respiratory tract infection) -     promethazine-dextromethorphan (PROMETHAZINE-DM) 6.25-15 MG/5ML syrup; Take 5 mLs by mouth 4 (four) times daily as needed for up to 7 days for cough. -     azithromycin (ZITHROMAX) 500 MG tablet; Take 1 tablet (500 mg total) by mouth daily for 3 days.   I am having [name] start on promethazine-dextromethorphan and azithromycin. I am also having her maintain her hydroxychloroquine, metoprolol succinate, albuterol, cyclobenzaprine, aspirin, oxyCODONE, nitroGLYCERIN, potassium chloride SA, rosuvastatin, pregabalin, levothyroxine, indapamide, isosorbide mononitrate, Vitamin D (Ergocalciferol), furosemide, dapagliflozin propanediol, pantoprazole, and azaTHIOprine.  Meds ordered this encounter  Medications   promethazine-dextromethorphan (PROMETHAZINE-DM) 6.25-15 MG/5ML syrup    Sig: Take 5 mLs by mouth 4 (four) times daily as needed for up to 7 days for cough.    Dispense:  118 mL    Refill:  0   azithromycin (ZITHROMAX) 500 MG tablet    Sig: Take 1 tablet (500 mg total) by mouth daily for 3 days.    Dispense:  3 tablet    Refill:  0     Follow-up: Return if symptoms worsen or fail to improve.  Scarlette Calico, MD

## 2022-01-27 ENCOUNTER — Encounter (INDEPENDENT_AMBULATORY_CARE_PROVIDER_SITE_OTHER): Payer: Self-pay | Admitting: Family Medicine

## 2022-01-27 ENCOUNTER — Ambulatory Visit (INDEPENDENT_AMBULATORY_CARE_PROVIDER_SITE_OTHER): Payer: BC Managed Care – PPO | Admitting: Family Medicine

## 2022-01-27 NOTE — Telephone Encounter (Signed)
Please address

## 2022-01-27 NOTE — Telephone Encounter (Signed)
I have followed up with patient in regard.

## 2022-01-28 ENCOUNTER — Other Ambulatory Visit: Payer: Self-pay | Admitting: Internal Medicine

## 2022-01-28 DIAGNOSIS — J22 Unspecified acute lower respiratory infection: Secondary | ICD-10-CM

## 2022-01-28 DIAGNOSIS — R051 Acute cough: Secondary | ICD-10-CM

## 2022-02-06 ENCOUNTER — Other Ambulatory Visit: Payer: Self-pay | Admitting: Interventional Cardiology

## 2022-02-10 ENCOUNTER — Ambulatory Visit: Payer: BC Managed Care – PPO | Attending: Cardiology | Admitting: Cardiology

## 2022-02-10 ENCOUNTER — Encounter: Payer: Self-pay | Admitting: Cardiology

## 2022-02-10 VITALS — BP 114/78 | HR 63 | Ht 67.0 in | Wt 240.0 lb

## 2022-02-10 DIAGNOSIS — I9789 Other postprocedural complications and disorders of the circulatory system, not elsewhere classified: Secondary | ICD-10-CM | POA: Diagnosis not present

## 2022-02-10 DIAGNOSIS — Z9889 Other specified postprocedural states: Secondary | ICD-10-CM | POA: Diagnosis not present

## 2022-02-10 DIAGNOSIS — I10 Essential (primary) hypertension: Secondary | ICD-10-CM

## 2022-02-10 DIAGNOSIS — I5032 Chronic diastolic (congestive) heart failure: Secondary | ICD-10-CM | POA: Diagnosis not present

## 2022-02-10 DIAGNOSIS — I4891 Unspecified atrial fibrillation: Secondary | ICD-10-CM

## 2022-02-10 DIAGNOSIS — N1831 Chronic kidney disease, stage 3a: Secondary | ICD-10-CM

## 2022-02-10 NOTE — Progress Notes (Unsigned)
Cardiology Office Note:    Date:  02/11/2022   ID:  [name], DOB 09/13/1955, MRN [id number]  PCP:  Janith Lima, MD  Cardiologist:  Sinclair Grooms, MD  Electrophysiologist:  None   Referring MD: Janith Lima, MD   " I am doing fine"  History of Present Illness:    [name] is a 66 y.o. female with a hx of lupus erythematous, coronary artery disease with prior history of stenting and ultimately four-vessel bypass in 2018, mitral valve annuloplasty at the time of her CABG in 2018, postoperative atrial fibrillation, peripheral artery disease and ascending aortic aneurysm.   The patient followed with Dr. Tamala Julian for many years and is now transitioning her care due to his upcoming retirement.  She last saw Dr. Tamala Julian in October 2022 at that time she appeared to be stable from a cardiovascular standpoint.  Her repeat echo was done and lots of counseling for secondary prevention was done at that time as well.  She was hospitalized in December 2022 at that time she presented for chest pain and was noted to be in acute exacerbation of heart failure.  She was diuresed appropriately and discharged to home.  Since her hospitalization she has not had any issues she has been doing well.  She offers no complaints in the office today.    Past Medical History:  Diagnosis Date   Back pain    Bilateral swelling of feet and ankles    CAD (coronary artery disease)    a. remote stents/angioplasty. b. s/p CABG 01/2017 with mitral valve annuloplasty.   Chest pain    Constipation    Diastolic heart failure (HCC)    Esophageal reflux    Gallbladder problem    GERD (gastroesophageal reflux disease)    History of MI (myocardial infarction)    Hyperlipidemia    Hypertension    Hypothyroidism    Immune thrombocytopenic purpura ([place])    Joint pain    Kidney problem    Lupus erythematosus    Mitral valve insufficiency and aortic valve insufficiency    Myocardial infarction  (HCC)    x 2   Other fatigue    PONV (postoperative nausea and vomiting)    Postoperative atrial fibrillation (HCC)    Renal disease    Severe mitral regurgitation    a. s/p MV annuloplasty 01/2017 at time of CABG.   Shortness of breath on exertion    Sleep apnea    Status post mitral valve annuloplasty    Unspecified disease of pericardium     Past Surgical History:  Procedure Laterality Date   CHOLECYSTECTOMY, LAPAROSCOPIC  2010   CORONARY ARTERY BYPASS GRAFT N/A 01/30/2017   Procedure: CORONARY ARTERY BYPASS GRAFTING (CABG), Times four  using the right saphaneous vein, harvested endoscopicly.  and left internal mammary artery .;  Surgeon: Gaye Pollack, MD;  Location: [facility];  Service: Open Heart Surgery;  Laterality: N/A;   LEFT HEART CATH AND CORONARY ANGIOGRAPHY N/A 01/23/2017   Procedure: LEFT HEART CATH AND CORONARY ANGIOGRAPHY;  Surgeon: Belva Crome, MD;  Location: [facility];  Service: Cardiovascular;  Laterality: N/A;   MITRAL VALVE REPAIR N/A 01/30/2017   Procedure: MITRAL VALVE REPAIR (MVR);  Surgeon: Gaye Pollack, MD;  Location: [place];  Service: Open Heart Surgery;  Laterality: N/A;   plastic surgical repair      dog bite to leg   RIGHT/LEFT HEART CATH AND CORONARY/GRAFT ANGIOGRAPHY  N/A 02/26/2021   Procedure: RIGHT/LEFT HEART CATH AND CORONARY/GRAFT ANGIOGRAPHY;  Surgeon: Belva Crome, MD;  Location: [facility] [facility];  Service: Cardiovascular;  Laterality: N/A;   SPLENECTOMY  5-04   TEE WITHOUT CARDIOVERSION N/A 01/27/2017   Procedure: TRANSESOPHAGEAL ECHOCARDIOGRAM (TEE);  Surgeon: Sanda Klein, MD;  Location: [facility];  Service: Cardiovascular;  Laterality: N/A;   TEE WITHOUT CARDIOVERSION N/A 01/30/2017   Procedure: TRANSESOPHAGEAL ECHOCARDIOGRAM (TEE);  Surgeon: Gaye Pollack, MD;  Location: [facility];  Service: Open Heart Surgery;  Laterality: N/A;   ULTRASOUND GUIDANCE FOR VASCULAR ACCESS  01/23/2017   Procedure: Ultrasound Guidance For Vascular  Access;  Surgeon: Belva Crome, MD;  Location: [place] [facility];  Service: Cardiovascular;;    Current Medications: Current Meds  Medication Sig   albuterol (VENTOLIN HFA) 108 (90 Base) MCG/ACT inhaler Inhale 2 puffs into the lungs every 6 (six) hours as needed for wheezing or shortness of breath.   aspirin 81 MG chewable tablet Chew 1 tablet (81 mg total) by mouth daily.   azaTHIOprine (IMURAN) 50 MG tablet TAKE 1 TABLET (50 MG) BY MOUTH IN THE MORNING AND AT BEDTIME   cyclobenzaprine (FLEXERIL) 5 MG tablet Take 1 tablet (5 mg total) by mouth 3 (three) times daily as needed.   dapagliflozin propanediol (FARXIGA) 10 MG TABS tablet Take 1 tablet (10 mg total) by mouth daily.   furosemide (LASIX) 20 MG tablet TAKE 1 TABLET BY MOUTH EVERY DAY   hydroxychloroquine (PLAQUENIL) 200 MG tablet Take 200 mg by mouth daily.    indapamide (LOZOL) 1.25 MG tablet TAKE 1 TABLET BY MOUTH EVERY DAY   isosorbide mononitrate (IMDUR) 30 MG 24 hr tablet TAKE 1 TABLET BY MOUTH EVERY DAY   levothyroxine (SYNTHROID) 75 MCG tablet Take 1 tablet (75 mcg total) by mouth daily before breakfast.   metoprolol succinate (TOPROL-XL) 50 MG 24 hr tablet Take 1 tablet (50 mg total) by mouth daily.   nitroGLYCERIN (NITROSTAT) 0.4 MG SL tablet Place 1 tablet (0.4 mg total) under the tongue every 5 (five) minutes as needed for chest pain.   oxyCODONE (OXY IR/ROXICODONE) 5 MG immediate release tablet Take 1 tablet (5 mg total) by mouth every 4 (four) hours as needed for moderate pain.   pantoprazole (PROTONIX) 40 MG tablet TAKE 1 TABLET BY MOUTH EVERY DAY   potassium chloride SA (KLOR-CON M) 20 MEQ tablet Take 1 tablet (20 mEq total) by mouth daily.   pregabalin (LYRICA) 50 MG capsule Take 50 mg by mouth 2 (two) times daily.   rosuvastatin (CRESTOR) 20 MG tablet Take 1 tablet (20 mg total) by mouth daily.   Vitamin D, Ergocalciferol, (DRISDOL) 1.25 MG (50000 UNIT) CAPS capsule Take 1 capsule (50,000 Units total) by mouth every  7 (seven) days.     Allergies:   Amlodipine and Cellcept [mycophenolate]   Social History   Socioeconomic History   Marital status: Single    Spouse name: Not on file   Number of children: 2   Years of education: Not on file   Highest education level: Master's degree (e.g., MA, MS, MEng, MEd, MSW, MBA)  Occupational History   OccupationPharmacist, community    Employer: [employer].   Tobacco Use   Smoking status: Former    Packs/day: 1.00    Years: 30.00    Total pack years: 30.00    Types: Cigarettes    Start date: 03/23/1986    Quit date: 12/23/2015  Years since quitting: 6.1   Smokeless tobacco: Never  Vaping Use   Vaping Use: Never used  Substance and Sexual Activity   Alcohol use: No   Drug use: No   Sexual activity: Not Currently    Comment: 1st intercourse 66 yo-More than 5 partners  Other Topics Concern   Not on file  Social History Narrative   HSG,  graduated from [employer] in [place] state. In college [name] -grad '13 with relgious studies. [employer] - Fall '13 for MA-divinity. Marrried '73 - 2 years/ divorced. 2 son- '73, '75 - CP.    Occupation: full-time Ship broker. She lives alone with her younger son living with her part-time but he resides in a managed care facility.    Social Determinants of Health   Financial Resource Strain: Low Risk  (06/10/2021)   Overall Financial Resource Strain (CARDIA)    Difficulty of Paying Living Expenses: Not hard at all  Food Insecurity: No Food Insecurity (06/10/2021)   Hunger Vital Sign    Worried About Running Out of Food in the Last Year: Never true    Ran Out of Food in the Last Year: Never true  Transportation Needs: No Transportation Needs (02/27/2021)   PRAPARE - Hydrologist (Medical): No    Lack of Transportation (Non-Medical): No  Physical Activity: Sufficiently Active (06/10/2021)   Exercise Vital Sign    Days of Exercise per Week: 5 days     Minutes of Exercise per Session: 30 min  Stress: Stress Concern Present (06/10/2021)   [place]    Feeling of Stress : To some extent  Social Connections: Moderately Integrated (06/10/2021)   Social Connection and Isolation Panel [NHANES]    Frequency of Communication with Friends and Family: More than three times a week    Frequency of Social Gatherings with Friends and Family: More than three times a week    Attends Religious Services: More than 4 times per year    Active Member of [employer] or Organizations: Yes    Attends Music therapist: More than 4 times per year    Marital Status: Divorced     Family History: The patient's family history includes Breast cancer in her mother and sister; Cancer in her mother and paternal grandfather; Fibromyalgia in her mother; GER disease in her father; Heart attack in her maternal grandmother; Heart disease in her father and mother; Hyperlipidemia in her mother; Hypertension in her father; Paget's disease of bone in her mother.  ROS:   Review of Systems  Constitution: Negative for decreased appetite, fever and weight gain.  HENT: Negative for congestion, ear discharge, hoarse voice and sore throat.   Eyes: Negative for discharge, redness, vision loss in right eye and visual halos.  Cardiovascular: Negative for chest pain, dyspnea on exertion, leg swelling, orthopnea and palpitations.  Respiratory: Negative for cough, hemoptysis, shortness of breath and snoring.   Endocrine: Negative for heat intolerance and polyphagia.  Hematologic/Lymphatic: Negative for bleeding problem. Does not bruise/bleed easily.  Skin: Negative for flushing, nail changes, rash and suspicious lesions.  Musculoskeletal: Negative for arthritis, joint pain, muscle cramps, myalgias, neck pain and stiffness.  Gastrointestinal: Negative for abdominal pain, bowel incontinence, diarrhea and excessive  appetite.  Genitourinary: Negative for decreased libido, genital sores and incomplete emptying.  Neurological: Negative for brief paralysis, focal weakness, headaches and loss of balance.  Psychiatric/Behavioral: Negative for altered mental status, depression and suicidal ideas.  Allergic/Immunologic: Negative for HIV exposure and persistent infections.    EKGs/Labs/Other Studies Reviewed:    The following studies were reviewed today:   EKG:  The ekg ordered today demonstrates sinus rhythm, heart rate is doing extremely well evidence of all  Recent Labs: 02/25/2021: B Natriuretic Peptide 310.9 02/27/2021: Magnesium 2.2 06/19/2021: ALT 13; Hemoglobin 13.9; Platelets 189 08/13/2021: BUN 29; Creatinine 1.2; Potassium 4.4; Sodium 139; TSH 1.57  Recent Lipid Panel    Component Value Date/Time   CHOL 134 12/02/2021 0905   TRIG 60 12/02/2021 0905   HDL 45 12/02/2021 0905   CHOLHDL 3.0 12/02/2021 0905   CHOLHDL 2.5 02/25/2021 0757   VLDL 7 02/25/2021 0757   LDLCALC 76 12/02/2021 0905   LDLCALC 68 11/14/2019 1537   LDLDIRECT 321.8 02/27/2012 1202    Physical Exam:    VS:  BP 114/78   Pulse 63   Ht '5\' 7"'$  (1.702 m)   Wt 240 lb (108.9 kg)   SpO2 99%   BMI 37.59 kg/m     Wt Readings from Last 3 Encounters:  02/10/22 240 lb (108.9 kg)  01/24/22 238 lb (108 kg)  12/30/21 236 lb (107 kg)     GEN: Well nourished, well developed in no acute distress HEENT: Normal NECK: No JVD; No carotid bruits LYMPHATICS: No lymphadenopathy CARDIAC: S1S2 noted,RRR, no murmurs, rubs, gallops RESPIRATORY:  Clear to auscultation without rales, wheezing or rhonchi  ABDOMEN: Soft, non-tender, non-distended, +bowel sounds, no guarding. EXTREMITIES: No edema, No cyanosis, no clubbing MUSCULOSKELETAL:  No deformity  SKIN: Warm and dry NEUROLOGIC:  Alert and oriented x 3, non-focal PSYCHIATRIC:  Normal affect, good insight  ASSESSMENT:    1. S/P MVR (mitral valve repair)   2. Postoperative atrial  fibrillation (HCC)   3. Primary hypertension   4. Chronic heart failure with preserved ejection fraction (HCC)   5. Stage 3a chronic kidney disease (HCC)    PLAN:    She is apprehensive about her transition of cardiovascular care.  We had a good conversation about this.  She had a few questions which I was able to answer.  She is doing well from a cardiovascular standpoint.  This is her transition of care from Dr. Tamala Julian due to his retirement to my practice.  We will get echocardiogram to reassess her valve given her recent echo in December 2022 showed increased gradients in the mitral valve suggesting mild to moderate mitral stenosis.  Clinically appears to be euvolemic no need for any medication changes at this time.  The patient understands the need to lose weight with diet and exercise. We have discussed specific strategies for this.  Blood pressure is acceptable, continue with current antihypertensive regimen.  Hyperlipidemia - continue with current statin medication.  The patient is in agreement with the above plan. The patient left the office in stable condition.  The patient will follow up in 1 year   Medication Adjustments/Labs and Tests Ordered: Current medicines are reviewed at length with the patient today.  Concerns regarding medicines are outlined above.  Orders Placed This Encounter  Procedures   EKG 12-Lead   ECHOCARDIOGRAM COMPLETE   No orders of the defined types were placed in this encounter.   Patient Instructions  Medication Instructions:  Your physician recommends that you continue on your current medications as directed. Please refer to the Current Medication list given to you today.  *If you need a refill on your cardiac medications before your next appointment, please call your pharmacy*  Lab Work: NONE If you have labs (blood work) drawn today and your tests are completely normal, you will receive your results only by: [place] (if you have  MyChart) OR A paper copy in the mail If you have any lab test that is abnormal or we need to change your treatment, we will call you to review the results.   Testing/Procedures: Your physician has requested that you have an echocardiogram. Echocardiography is a painless test that uses sound waves to create images of your heart. It provides your doctor with information about the size and shape of your heart and how well your heart's chambers and valves are working. This procedure takes approximately one hour. There are no restrictions for this procedure. Please do NOT wear cologne, perfume, aftershave, or lotions (deodorant is allowed). Please arrive 15 minutes prior to your appointment time.    Follow-Up: At [facility], you and your health needs are our priority.  As part of our continuing mission to provide you with exceptional heart care, we have created designated Provider Care Teams.  These Care Teams include your primary Cardiologist (physician) and Advanced Practice Providers (APPs -  Physician Assistants and Nurse Practitioners) who all work together to provide you with the care you need, when you need it.  We recommend signing up for the patient portal called "MyChart".  Sign up information is provided on this After Visit Summary.  MyChart is used to connect with patients for Virtual Visits (Telemedicine).  Patients are able to view lab/test results, encounter notes, upcoming appointments, etc.  Non-urgent messages can be sent to your provider as well.   To learn more about what you can do with MyChart, go to [email or web address].    Your next appointment:   1 year(s)  The format for your next appointment:   In Person  Provider:   Berniece Salines, DO    Adopting a Healthy Lifestyle.  Know what a healthy weight is for you (roughly BMI <25) and aim to maintain this   Aim for 7+ servings of fruits and vegetables daily   65-80+ fluid ounces of water or unsweet tea for  healthy kidneys   Limit to max 1 drink of alcohol per day; avoid smoking/tobacco   Limit animal fats in diet for cholesterol and heart health - choose grass fed whenever available   Avoid highly processed foods, and foods high in saturated/trans fats   Aim for low stress - take time to unwind and care for your mental health   Aim for 150 min of moderate intensity exercise weekly for heart health, and weights twice weekly for bone health   Aim for 7-9 hours of sleep daily   When it comes to diets, agreement about the perfect plan isnt easy to find, even among the experts. Experts at the [employer] developed an idea known as the Healthy Eating Plate. Just imagine a plate divided into logical, healthy portions.   The emphasis is on diet quality:   Load up on vegetables and fruits - one-half of your plate: Aim for color and variety, and remember that potatoes dont count.   Go for whole grains - one-quarter of your plate: Whole wheat, barley, wheat berries, quinoa, oats, brown rice, and foods made with them. If you want pasta, go with whole wheat pasta.   Protein power - one-quarter of your plate: Fish, chicken, beans, and nuts are all healthy, versatile protein sources. Limit red meat.   The diet,  however, does go beyond the plate, offering a few other suggestions.   Use healthy plant oils, such as olive, canola, soy, corn, sunflower and peanut. Check the labels, and avoid partially hydrogenated oil, which have unhealthy trans fats.   If youre thirsty, drink water. Coffee and tea are good in moderation, but skip sugary drinks and limit milk and dairy products to one or two daily servings.   The type of carbohydrate in the diet is more important than the amount. Some sources of carbohydrates, such as vegetables, fruits, whole grains, and beans-are healthier than others.   Finally, stay active  Signed, Berniece Salines, DO  02/11/2022 8:56 AM    [place]

## 2022-02-10 NOTE — Patient Instructions (Signed)
Medication Instructions:  Your physician recommends that you continue on your current medications as directed. Please refer to the Current Medication list given to you today.  *If you need a refill on your cardiac medications before your next appointment, please call your pharmacy*   Lab Work: NONE If you have labs (blood work) drawn today and your tests are completely normal, you will receive your results only by: [place] (if you have MyChart) OR A paper copy in the mail If you have any lab test that is abnormal or we need to change your treatment, we will call you to review the results.   Testing/Procedures: Your physician has requested that you have an echocardiogram. Echocardiography is a painless test that uses sound waves to create images of your heart. It provides your doctor with information about the size and shape of your heart and how well your heart's chambers and valves are working. This procedure takes approximately one hour. There are no restrictions for this procedure. Please do NOT wear cologne, perfume, aftershave, or lotions (deodorant is allowed). Please arrive 15 minutes prior to your appointment time.    Follow-Up: At [facility], you and your health needs are our priority.  As part of our continuing mission to provide you with exceptional heart care, we have created designated Provider Care Teams.  These Care Teams include your primary Cardiologist (physician) and Advanced Practice Providers (APPs -  Physician Assistants and Nurse Practitioners) who all work together to provide you with the care you need, when you need it.  We recommend signing up for the patient portal called "MyChart".  Sign up information is provided on this After Visit Summary.  MyChart is used to connect with patients for Virtual Visits (Telemedicine).  Patients are able to view lab/test results, encounter notes, upcoming appointments, etc.  Non-urgent messages can be sent to your  provider as well.   To learn more about what you can do with MyChart, go to [email or web address].    Your next appointment:   1 year(s)  The format for your next appointment:   In Person  Provider:   Berniece Salines, DO

## 2022-02-15 ENCOUNTER — Other Ambulatory Visit: Payer: Self-pay | Admitting: Interventional Cardiology

## 2022-02-19 ENCOUNTER — Other Ambulatory Visit: Payer: Self-pay | Admitting: Interventional Cardiology

## 2022-02-19 ENCOUNTER — Telehealth: Payer: Self-pay | Admitting: Cardiology

## 2022-02-19 NOTE — Telephone Encounter (Signed)
*  STAT* If patient is at the pharmacy, call can be transferred to refill team.   1. Which medications need to be refilled? (please list name of each medication and dose if known)   dapagliflozin propanediol (FARXIGA) 10 MG TABS tablet    2. Which pharmacy/location (including street and city if local pharmacy) is medication to be sent to?  [id number] - [address]   3. Do they need a 30 day or 90 day supply? [address]

## 2022-02-27 ENCOUNTER — Encounter (INDEPENDENT_AMBULATORY_CARE_PROVIDER_SITE_OTHER): Payer: Self-pay | Admitting: Family Medicine

## 2022-02-27 ENCOUNTER — Ambulatory Visit (INDEPENDENT_AMBULATORY_CARE_PROVIDER_SITE_OTHER): Payer: BC Managed Care – PPO | Admitting: Family Medicine

## 2022-02-27 VITALS — BP 105/70 | HR 66 | Temp 97.6°F | Ht 67.0 in | Wt 232.0 lb

## 2022-02-27 DIAGNOSIS — E559 Vitamin D deficiency, unspecified: Secondary | ICD-10-CM | POA: Diagnosis not present

## 2022-02-27 DIAGNOSIS — E669 Obesity, unspecified: Secondary | ICD-10-CM

## 2022-02-27 DIAGNOSIS — E88819 Insulin resistance, unspecified: Secondary | ICD-10-CM | POA: Diagnosis not present

## 2022-02-27 DIAGNOSIS — Z6836 Body mass index (BMI) 36.0-36.9, adult: Secondary | ICD-10-CM | POA: Diagnosis not present

## 2022-02-27 MED ORDER — VITAMIN D (ERGOCALCIFEROL) 1.25 MG (50000 UNIT) PO CAPS: 50000.0000 [IU] | ORAL_CAPSULE | ORAL | 0 refills | Status: DC

## 2022-03-11 ENCOUNTER — Other Ambulatory Visit: Payer: Self-pay | Admitting: Internal Medicine

## 2022-03-11 ENCOUNTER — Ambulatory Visit (HOSPITAL_COMMUNITY): Payer: BC Managed Care – PPO | Attending: Cardiovascular Disease

## 2022-03-11 DIAGNOSIS — Z98810 Dental sealant status: Secondary | ICD-10-CM | POA: Diagnosis not present

## 2022-03-11 DIAGNOSIS — I088 Other rheumatic multiple valve diseases: Secondary | ICD-10-CM | POA: Diagnosis not present

## 2022-03-11 DIAGNOSIS — Z9889 Other specified postprocedural states: Secondary | ICD-10-CM | POA: Insufficient documentation

## 2022-03-11 DIAGNOSIS — Z1231 Encounter for screening mammogram for malignant neoplasm of breast: Secondary | ICD-10-CM

## 2022-03-11 LAB — ECHOCARDIOGRAM COMPLETE
Area-P 1/2: 1.47 cm2
MV M vel: 5.66 m/s
MV Peak grad: 128.1 mmHg
MV VTI: 1.53 cm2
P 1/2 time: 603 msec
S' Lateral: 4.3 cm

## 2022-03-16 ENCOUNTER — Other Ambulatory Visit: Payer: Self-pay | Admitting: Interventional Cardiology

## 2022-03-21 ENCOUNTER — Other Ambulatory Visit: Payer: Self-pay | Admitting: Interventional Cardiology

## 2022-03-23 ENCOUNTER — Other Ambulatory Visit: Payer: Self-pay | Admitting: Internal Medicine

## 2022-03-25 DIAGNOSIS — E88819 Insulin resistance, unspecified: Secondary | ICD-10-CM | POA: Insufficient documentation

## 2022-03-31 ENCOUNTER — Encounter (INDEPENDENT_AMBULATORY_CARE_PROVIDER_SITE_OTHER): Payer: Self-pay | Admitting: Family Medicine

## 2022-03-31 ENCOUNTER — Ambulatory Visit (INDEPENDENT_AMBULATORY_CARE_PROVIDER_SITE_OTHER): Payer: BC Managed Care – PPO | Admitting: Family Medicine

## 2022-03-31 VITALS — BP 126/85 | HR 57 | Temp 98.1°F | Ht 67.0 in | Wt 233.2 lb

## 2022-03-31 DIAGNOSIS — E669 Obesity, unspecified: Secondary | ICD-10-CM

## 2022-03-31 DIAGNOSIS — E88819 Insulin resistance, unspecified: Secondary | ICD-10-CM | POA: Diagnosis not present

## 2022-03-31 DIAGNOSIS — E559 Vitamin D deficiency, unspecified: Secondary | ICD-10-CM

## 2022-03-31 DIAGNOSIS — I25709 Atherosclerosis of coronary artery bypass graft(s), unspecified, with unspecified angina pectoris: Secondary | ICD-10-CM

## 2022-03-31 DIAGNOSIS — Z6836 Body mass index (BMI) 36.0-36.9, adult: Secondary | ICD-10-CM | POA: Diagnosis not present

## 2022-03-31 DIAGNOSIS — E66813 Obesity, class 3: Secondary | ICD-10-CM

## 2022-03-31 DIAGNOSIS — E785 Hyperlipidemia, unspecified: Secondary | ICD-10-CM | POA: Diagnosis not present

## 2022-03-31 MED ORDER — TIRZEPATIDE-WEIGHT MANAGEMENT 2.5 MG/0.5ML ~~LOC~~ SOAJ: 2.5000 mg | SUBCUTANEOUS | 0 refills | Status: DC

## 2022-03-31 MED ORDER — VITAMIN D (ERGOCALCIFEROL) 1.25 MG (50000 UNIT) PO CAPS: 50000.0000 [IU] | ORAL_CAPSULE | ORAL | 0 refills | Status: DC

## 2022-04-01 ENCOUNTER — Telehealth (INDEPENDENT_AMBULATORY_CARE_PROVIDER_SITE_OTHER): Payer: Self-pay

## 2022-04-01 NOTE — Telephone Encounter (Signed)
[employer]ep[employer] Approved PA Case ID #: [id number]

## 2022-04-01 NOTE — Telephone Encounter (Signed)
Key: [id number] - Rx #: [id number]  Prior aut submitted thru covermymeds for zepbound.

## 2022-04-02 NOTE — Telephone Encounter (Signed)
Exp: 04/01/22-12/01/22.

## 2022-04-03 NOTE — Progress Notes (Signed)
Chief Complaint:   OBESITY [name] is here to discuss her progress with her obesity treatment plan along with follow-up of her obesity related diagnoses. [name] is on the Category 1 Plan with breakfast and lunch options and keeping a food journal and adhering to recommended goals of 1000 calories and 90 grams protein and states she is following her eating plan approximately 98% of the time. [name] states she is going to the gym 60 minutes 2 times per week.  Today's visit was #: 14 Starting weight: 258 lbs Starting date: 04/09/2021 Today's weight: 232 lbs Today's date: 02/27/2022 Total lbs lost to date: 26 Total lbs lost since last in-office visit: 4  Interim History: [name] is practicing mindful eating. She is doing 20-30 minutes of cardio and 30-40 minutes of weights at the gym 2 days a week. She is getting in 32 oz of water per day. Pt has been eating all foods on plan and is still hungry at times.  Subjective:   1. Insulin resistance In July 2023, we started [facility] on pt and within 2-3 days of injection, pt had severe stomach cramping and blood in her stools. However, pt still desires to try another GLP-1 (Zepbound) despite these side effects.  2. Vitamin D deficiency [name]'s Vitamin D level was 43.8 approximately 3 months ago. [name] is tolerating medication(s) well without side effects.  Medication compliance is good as patient endorses taking it as prescribed.  Symptoms are stable and the patient denies additional concerns regarding this condition.    Assessment/Plan:  No orders of the defined types were placed in this encounter.   Medications Discontinued During This Encounter  Medication Reason   Vitamin D, Ergocalciferol, (DRISDOL) 1.25 MG (50000 UNIT) CAPS capsule Reorder     Meds ordered this encounter  Medications   DISCONTD: Vitamin D, Ergocalciferol, (DRISDOL) 1.25 MG (50000 UNIT) CAPS capsule    Sig: Take 1 capsule (50,000 Units total) by mouth  every 7 (seven) days.    Dispense:  8 capsule    Refill:  0    60 d supply;  ** OV for RF **   Do not send RF request     1. Insulin resistance Pt is to call her insurance and inquire about Zepbound coverage. Continue prudent nutritional plan and weight loss.  2. Vitamin D deficiency Vitamin D at goal. Low Vitamin D level contributes to fatigue and are associated with obesity, breast, and colon cancer. She agrees to continue to take prescription Vitamin D '@50'$ ,000 IU every week and will follow-up for routine testing of Vitamin D, at least 2-3 times per year to avoid over-replacement. Continue prudent nutritional plan and weight loss.  3. Obesity current BMI 36.4 [name] is currently in the action stage of change. As such, her goal is to continue with weight loss efforts. She has agreed to the Category 1 Plan with breakfast and lunch options and keeping a food journal and adhering to recommended goals of 1000 calories and 90+ grams protein.   Pt will contact insurance regarding Zepbound coverage versus Wegovy versus Saxenda. We will start weight loss meds in the new year.  Exercise goals:  As is  Behavioral modification strategies: holiday eating strategies , avoiding temptations, and planning for success.  [name] has agreed to follow-up with our clinic in 3-4 weeks. She was informed of the importance of frequent follow-up visits to maximize her success with intensive lifestyle modifications for her multiple health conditions.   Objective:  Blood pressure 105/70, pulse 66, temperature 97.6 F (36.4 C), height '5\' 7"'$  (1.702 m), weight 232 lb (105.2 kg), SpO2 94 %. Body mass index is 36.34 kg/m.  General: Cooperative, alert, well developed, in no acute distress. HEENT: Conjunctivae and lids unremarkable. Cardiovascular: Regular rhythm.  Lungs: Normal work of breathing. Neurologic: No focal deficits.   Lab Results  Component Value Date   CREATININE 1.2 (A) 08/13/2021   BUN 29 (A)  08/13/2021   NA 139 08/13/2021   K 4.4 08/13/2021   CL 105 08/13/2021   CO2 26 (A) 08/13/2021   Lab Results  Component Value Date   ALT 13 06/19/2021   AST 20 06/19/2021   ALKPHOS 66 02/26/2021   BILITOT 0.7 02/26/2021   Lab Results  Component Value Date   HGBA1C 5.4 12/02/2021   HGBA1C 5.2 02/25/2021   HGBA1C 5.6 01/31/2017   Lab Results  Component Value Date   INSULIN 8.0 12/02/2021   INSULIN 17.2 04/09/2021   Lab Results  Component Value Date   TSH 1.57 08/13/2021   Lab Results  Component Value Date   CHOL 134 12/02/2021   HDL 45 12/02/2021   LDLCALC 76 12/02/2021   LDLDIRECT 321.8 02/27/2012   TRIG 60 12/02/2021   CHOLHDL 3.0 12/02/2021   Lab Results  Component Value Date   VD25OH 43.8 12/02/2021   VD25OH 17.2 (L) 04/09/2021   Lab Results  Component Value Date   WBC 3.3 06/19/2021   HGB 13.9 06/19/2021   HCT 41 06/19/2021   MCV 100.8 (H) 02/26/2021   PLT 189 06/19/2021   Lab Results  Component Value Date   IRON 386 06/23/2017   TIBC 285 06/23/2017   FERRITIN 58 06/23/2017    Attestation Statements:   Reviewed by clinician on day of visit: allergies, medications, problem list, medical history, surgical history, family history, social history, and previous encounter notes.  I, Kathlene November, BS, CMA, am acting as transcriptionist for [employer], DO.   I have reviewed the above documentation for accuracy and completeness, and I agree with the above. Marjory Sneddon, D.O.  The [place] was signed into law in 2016 which includes the topic of electronic health records.  This provides immediate access to information in MyChart.  This includes consultation notes, operative notes, office notes, lab results and pathology reports.  If you have any questions about what you read please let us know at your next visit so we can discuss your concerns and take corrective action if need be.  We are right here with you.

## 2022-04-10 ENCOUNTER — Other Ambulatory Visit: Payer: Self-pay | Admitting: Interventional Cardiology

## 2022-04-15 NOTE — Progress Notes (Signed)
Chief Complaint:   OBESITY [name] is here to discuss her progress with her obesity treatment plan along with follow-up of her obesity related diagnoses. [name] is on the Category 1 Plan and states she is following her eating plan approximately 95% of the time. [name] states she is gym 60 minutes 2 times per week.  Today's visit was #: 15 Starting weight: 258 lbs Starting date: 04/09/2021 Today's weight: 233 lbs Today's date: 03/31/2022 Total lbs lost to date: 25 lbs Total lbs lost since last in-office visit: +1 lb  Interim History: She lost 2.2 lbs in fat mass and gained 3 lbs in muscle mass.  Did great over the holidays. Patient denies a personal or family history of pancreatitis, medullary thyroid carcinoma or multiple endocrine neoplasia type II. Recommend reviewing pen training video online.  Subjective:   1. Hyperlipidemia LDL goal <70; at risk ASCVD Kimerly Rowand is tolerating medication(s) well without side effects.  Medication compliance is good as patient endorses taking it as prescribed.  Symptoms are stable and the patient denies additional concerns regarding this condition.    2. Coronary artery disease involving coronary bypass graft of native heart with angina pectoris ([place]) Positive history of stents and coronary artery.  Patient strongly desires GLP-1 especially since history of CHF and heart diagnosis.  3. Insulin resistance with polyphagia and carb cravings Patient has excepted carb cravings.  Declines metformin.  4. Vitamin D deficiency Shainna Faux is tolerating medication(s) well without side effects.  Medication compliance is good as patient endorses taking it as prescribed.  Symptoms are stable and the patient denies additional concerns regarding this condition.    Assessment/Plan:  No orders of the defined types were placed in this encounter.   Medications Discontinued During This Encounter  Medication Reason   Vitamin D, Ergocalciferol,  (DRISDOL) 1.25 MG (50000 UNIT) CAPS capsule Reorder     Meds ordered this encounter  Medications   DISCONTD: Vitamin D, Ergocalciferol, (DRISDOL) 1.25 MG (50000 UNIT) CAPS capsule    Sig: Take 1 capsule (50,000 Units total) by mouth every 7 (seven) days.    Dispense:  8 capsule    Refill:  0    60 d supply;  ** OV for RF **   Do not send RF request   DISCONTD: tirzepatide (ZEPBOUND) 2.5 MG/0.5ML Pen    Sig: Inject 2.5 mg into the skin once a week.    Dispense:  2 mL    Refill:  0     1. Hyperlipidemia LDL goal <70; at risk ASCVD LDL is 76, four months ago. Not quite at goal of <70. Continue Crestor.    2. Coronary artery disease involving coronary bypass graft of native heart with angina pectoris ([place]) I do feel a GLP-1 with patient's history would be a much better choice than other medications.  Start- tirzepatide (ZEPBOUND) 2.5 MG/0.5ML Pen; Inject 2.5 mg into the skin once a week.  Dispense: 2 mL; Refill: 0  3. Insulin resistance with polyphagia and carb cravings Start - tirzepatide (ZEPBOUND) 2.5 MG/0.5ML Pen; Inject 2.5 mg into the skin once a week.  Dispense: 2 mL; Refill: 0  4. Vitamin D deficiency Low Vitamin D level contributes to fatigue and are associated with obesity, breast, and colon cancer. She agrees to continue to take prescription Vitamin D '@50'$ ,000 IU every week and will follow-up for routine testing of Vitamin D, at least 2-3 times per year to avoid over-replacement.  Refill- Vitamin D, Ergocalciferol, (  DRISDOL) 1.25 MG (50000 UNIT) CAPS capsule; Take 1 capsule (50,000 Units total) by mouth every 7 (seven) days.  Dispense: 8 capsule; Refill: 0  5. Obesity current BMI 36.5 Increase cardio to 3-5 days per week for 30 minutes.  Continue 2 days per week strength training.  Bring in log at next office visit.   Start - tirzepatide (ZEPBOUND) 2.5 MG/0.5ML Pen; Inject 2.5 mg into the skin once a week.  Dispense: 2 mL; Refill: 0 We will increase dose as tolerated.    [name] is currently in the action stage of change. As such, her goal is to continue with weight loss efforts. She has agreed to the Category 1 Plan+ journal intake.   Exercise goals:  As is above.   Behavioral modification strategies: decreasing simple carbohydrates, increasing water intake, no skipping meals, and planning for success.  [name] has agreed to follow-up with our clinic in 3 weeks. She was informed of the importance of frequent follow-up visits to maximize her success with intensive lifestyle modifications for her multiple health conditions.   Objective:   Blood pressure 126/85, pulse (!) 57, temperature 98.1 F (36.7 C), height '5\' 7"'$  (1.702 m), weight 233 lb 3.2 oz (105.8 kg), SpO2 97 %. Body mass index is 36.52 kg/m.  General: Cooperative, alert, well developed, in no acute distress. HEENT: Conjunctivae and lids unremarkable. Cardiovascular: Regular rhythm.  Lungs: Normal work of breathing. Neurologic: No focal deficits.   Lab Results  Component Value Date   CREATININE 1.2 (A) 08/13/2021   BUN 29 (A) 08/13/2021   NA 139 08/13/2021   K 4.4 08/13/2021   CL 105 08/13/2021   CO2 26 (A) 08/13/2021   Lab Results  Component Value Date   ALT 13 06/19/2021   AST 20 06/19/2021   ALKPHOS 66 02/26/2021   BILITOT 0.7 02/26/2021   Lab Results  Component Value Date   HGBA1C 5.4 12/02/2021   HGBA1C 5.2 02/25/2021   HGBA1C 5.6 01/31/2017   Lab Results  Component Value Date   INSULIN 8.0 12/02/2021   INSULIN 17.2 04/09/2021   Lab Results  Component Value Date   TSH 1.57 08/13/2021   Lab Results  Component Value Date   CHOL 134 12/02/2021   HDL 45 12/02/2021   LDLCALC 76 12/02/2021   LDLDIRECT 321.8 02/27/2012   TRIG 60 12/02/2021   CHOLHDL 3.0 12/02/2021   Lab Results  Component Value Date   VD25OH 43.8 12/02/2021   VD25OH 17.2 (L) 04/09/2021   Lab Results  Component Value Date   WBC 3.3 06/19/2021   HGB 13.9 06/19/2021   HCT 41 06/19/2021   MCV  100.8 (H) 02/26/2021   PLT 189 06/19/2021   Lab Results  Component Value Date   IRON 386 06/23/2017   TIBC 285 06/23/2017   FERRITIN 58 06/23/2017   Attestation Statements:   Reviewed by clinician on day of visit: allergies, medications, problem list, medical history, surgical history, family history, social history, and previous encounter notes.  I, Davy Pique, RMA, am acting as Location manager for [employer], DO.   I have reviewed the above documentation for accuracy and completeness, and I agree with the above. Marjory Sneddon, D.O.  The [place] was signed into law in 2016 which includes the topic of electronic health records.  This provides immediate access to information in MyChart.  This includes consultation notes, operative notes, office notes, lab results and pathology reports.  If you have any questions about what you  read please let us know at your next visit so we can discuss your concerns and take corrective action if need be.  We are right here with you.

## 2022-04-23 ENCOUNTER — Other Ambulatory Visit (INDEPENDENT_AMBULATORY_CARE_PROVIDER_SITE_OTHER): Payer: Self-pay | Admitting: Family Medicine

## 2022-04-23 ENCOUNTER — Encounter (INDEPENDENT_AMBULATORY_CARE_PROVIDER_SITE_OTHER): Payer: Self-pay | Admitting: Family Medicine

## 2022-04-23 ENCOUNTER — Ambulatory Visit (INDEPENDENT_AMBULATORY_CARE_PROVIDER_SITE_OTHER): Payer: Medicare Other | Admitting: Family Medicine

## 2022-04-23 VITALS — BP 119/80 | HR 96 | Temp 97.5°F | Ht 67.0 in | Wt 229.8 lb

## 2022-04-23 DIAGNOSIS — Z6836 Body mass index (BMI) 36.0-36.9, adult: Secondary | ICD-10-CM | POA: Insufficient documentation

## 2022-04-23 DIAGNOSIS — E7849 Other hyperlipidemia: Secondary | ICD-10-CM | POA: Insufficient documentation

## 2022-04-23 DIAGNOSIS — E539 Vitamin B deficiency, unspecified: Secondary | ICD-10-CM

## 2022-04-23 DIAGNOSIS — E88819 Insulin resistance, unspecified: Secondary | ICD-10-CM | POA: Diagnosis not present

## 2022-04-23 DIAGNOSIS — E559 Vitamin D deficiency, unspecified: Secondary | ICD-10-CM

## 2022-04-23 MED ORDER — VITAMIN D (ERGOCALCIFEROL) 1.25 MG (50000 UNIT) PO CAPS: 50000.0000 [IU] | ORAL_CAPSULE | ORAL | 0 refills | Status: DC

## 2022-04-23 MED ORDER — ZEPBOUND 5 MG/0.5ML ~~LOC~~ SOAJ: 5.0000 mg | SUBCUTANEOUS | 0 refills | Status: DC

## 2022-04-24 LAB — HEMOGLOBIN A1C
Est. average glucose Bld gHb Est-mCnc: 103 mg/dL
Hgb A1c MFr Bld: 5.2 % (ref 4.8–5.6)

## 2022-04-25 LAB — VITAMIN D 25 HYDROXY (VIT D DEFICIENCY, FRACTURES): Vit D, 25-Hydroxy: 51.6 ng/mL (ref 30.0–100.0)

## 2022-04-25 LAB — LIPID PANEL WITH LDL/HDL RATIO
Cholesterol, Total: 134 mg/dL (ref 100–199)
HDL: 55 mg/dL (ref >39)
LDL Chol Calc (NIH): 67 mg/dL (ref 0–99)
LDL/HDL Ratio: 1.2 ratio (ref 0.0–3.2)
Triglycerides: 57 mg/dL (ref 0–149)
VLDL Cholesterol Cal: 12 mg/dL (ref 5–40)

## 2022-04-25 LAB — COMPREHENSIVE METABOLIC PANEL
ALT: 15 IU/L (ref 0–32)
AST: 21 IU/L (ref 0–40)
Albumin/Globulin Ratio: 1.4 (ref 1.2–2.2)
Albumin: 4.2 g/dL (ref 3.9–4.9)
Alkaline Phosphatase: 100 IU/L (ref 44–121)
BUN/Creatinine Ratio: 20 (ref 12–28)
BUN: 24 mg/dL (ref 8–27)
Bilirubin Total: 0.4 mg/dL (ref 0.0–1.2)
CO2: 20 mmol/L (ref 20–29)
Calcium: 9.4 mg/dL (ref 8.7–10.3)
Chloride: 103 mmol/L (ref 96–106)
Creatinine, Ser: 1.22 mg/dL — ABNORMAL HIGH (ref 0.57–1.00)
Globulin, Total: 3 g/dL (ref 1.5–4.5)
Glucose: 80 mg/dL (ref 70–99)
Potassium: 4.8 mmol/L (ref 3.5–5.2)
Sodium: 142 mmol/L (ref 134–144)
Total Protein: 7.2 g/dL (ref 6.0–8.5)
eGFR: 49 mL/min/{1.73_m2} — ABNORMAL LOW (ref >59)

## 2022-04-25 LAB — VITAMIN B12: Vitamin B-12: >2000 pg/mL — ABNORMAL HIGH (ref 232–1245)

## 2022-04-25 LAB — INSULIN, RANDOM: INSULIN: 16.5 u[IU]/mL (ref 2.6–24.9)

## 2022-04-28 ENCOUNTER — Ambulatory Visit (INDEPENDENT_AMBULATORY_CARE_PROVIDER_SITE_OTHER): Payer: BC Managed Care – PPO | Admitting: Family Medicine

## 2022-05-06 ENCOUNTER — Ambulatory Visit
Admission: RE | Admit: 2022-05-06 | Discharge: 2022-05-06 | Disposition: A | Discharge status: Home / Self Care | Payer: Medicare Other | Source: Ambulatory Visit | Attending: Internal Medicine | Admitting: Internal Medicine

## 2022-05-06 DIAGNOSIS — Z1231 Encounter for screening mammogram for malignant neoplasm of breast: Secondary | ICD-10-CM | POA: Diagnosis not present

## 2022-05-08 ENCOUNTER — Other Ambulatory Visit: Payer: Self-pay

## 2022-05-08 ENCOUNTER — Other Ambulatory Visit: Payer: Self-pay | Admitting: Internal Medicine

## 2022-05-08 DIAGNOSIS — E039 Hypothyroidism, unspecified: Secondary | ICD-10-CM

## 2022-05-08 MED ORDER — METOPROLOL SUCCINATE ER 50 MG PO TB24: 50.0000 mg | ORAL_TABLET | Freq: Every day | ORAL | 3 refills | Status: DC

## 2022-05-09 ENCOUNTER — Other Ambulatory Visit: Payer: Self-pay | Admitting: Internal Medicine

## 2022-05-09 DIAGNOSIS — R928 Other abnormal and inconclusive findings on diagnostic imaging of breast: Secondary | ICD-10-CM

## 2022-05-13 NOTE — Progress Notes (Unsigned)
Chief Complaint:   OBESITY [name] is here to discuss her progress with her obesity treatment plan along with follow-up of her obesity related diagnoses. [name] is on the Category 1 Plan and states she is following her eating plan approximately 98% of the time. [name] states she is going to gym 60 minutes 2 times per week.  Today's visit was #: 66 Starting weight: 258 LBS Starting date: 04/09/2021 Today's weight: 229 LBS Today's date: 04/23/2022 Total lbs lost to date: 29 LBS Total lbs lost since last in-office visit: 4  Interim History: [name] is on Zepbound at 440$/month with online coupon.  No intolerances.  Great control of hunger and cravings.  Has to force herself to eat.  Subjective:   1. Other hyperlipidemia On Crestor.  2. Insulin resistance with polyphagia and carb cravings Tolerates Zepbound well.  Devises increased dose.  3. Vitamin B deficiency [name] is tolerating medication(s) well without side effects.  Medication compliance is good as patient endorses taking it as prescribed.  Symptoms are stable and the patient denies additional concerns regarding this condition.      Assessment/Plan:   Orders Placed This Encounter  Procedures   Comprehensive metabolic panel   Hemoglobin A1c   Insulin, random   Lipid Panel With LDL/HDL Ratio   Vitamin B12   VITAMIN D 25 Hydroxy (Vit-D Deficiency, Fractures)    Medications Discontinued During This Encounter  Medication Reason   tirzepatide (ZEPBOUND) 2.5 MG/0.5ML Pen Dose change   Vitamin D, Ergocalciferol, (DRISDOL) 1.25 MG (50000 UNIT) CAPS capsule Reorder     Meds ordered this encounter  Medications   Vitamin D, Ergocalciferol, (DRISDOL) 1.25 MG (50000 UNIT) CAPS capsule    Sig: Take 1 capsule (50,000 Units total) by mouth every 7 (seven) days.    Dispense:  8 capsule    Refill:  0    60 d supply;  ** OV for RF **   Do not send RF request   tirzepatide (ZEPBOUND) 5 MG/0.5ML Pen    Sig: Inject 5  mg into the skin once a week.    Dispense:  2 mL    Refill:  0     1. Other hyperlipidemia We will obtain labs today.    - Comprehensive metabolic panel - Lipid Panel With LDL/HDL Ratio - Vitamin B12  2. Insulin resistance with polyphagia and carb cravings We will obtain labs today.  - Hemoglobin A1c - Insulin, random - Vitamin B12  3. Vitamin B deficiency We will obtain labs today.  We will refill ergocalciferol once a week for 1 month with 0 refills.  - B12 level is >2000, not at goal of over 500.  This diagnosis was reviewed with the patient and education was provided.  Lab Results  Component Value Date   VITAMINB12 >2000 (H) 04/23/2022   - We recommended patient start her prudent nutritional plan and also start OTC B12/ cyanocobalamin of [phone] mcg daily - We will continue to monitor as deemed clinically necessary. - Counseling provided today, see below: - Your Vitamin B12 level is low. This vitamin is very important for the proper functioning of nerves. A vitamin B12 deficiency can cause anemia and neurologic symptoms such as tingling and pain in the hands and feet and possible cognitive problems. - Vitamin B12 is naturally present in foods of animal origin, including fish, meat, poultry, eggs, and dairy products.  - Causes of vitamin B12 deficiency include difficulty absorbing vitamin B12 from food, lack  of intrinsic factor (e.g., because of pernicious anemia), surgery in the gastrointestinal tract, prolonged use of certain medications (e.g., metformin or proton pump inhibitors, etc and dietary deficiency.  - The effects of vitamin B12 deficiency:  can include the hallmark megaloblastic anemia, fatigue, heart palpitations; pale skin; dementia; and infertility.  Neurological changes, such as numbness and tingling in the hands and feet, can also occur.  These neurological symptoms can occur without anemia, so early diagnosis and intervention is important to avoid irreversible  damage. In addition, some studies have found associations between vitamin B12 deficiency or low vitamin B12 intakes and depression.   - Vitamin B12  - VITAMIN D 25 Hydroxy (Vit-D Deficiency, Fractures)  -Refill Vitamin D, Ergocalciferol, (DRISDOL) 1.25 MG (50000 UNIT) CAPS capsule; Take 1 capsule (50,000 Units total) by mouth every 7 (seven) days.  Dispense: 8 capsule; Refill: 0  4. Morbid obesity (HCC)-starting bmi 41.64 We will obtain labs today.    - Vitamin B12  -Refill tirzepatide (ZEPBOUND) 5 MG/0.5ML Pen; Inject 5 mg into the skin once a week.  Dispense: 2 mL; Refill: 0  5. BMI 36.0-36.9,adult-current bmi 36.0 [name] is currently in the action stage of change. As such, her goal is to continue with weight loss efforts. She has agreed to the Category 1 Plan.   Exercise goals: As is.  Behavioral modification strategies: increasing lean protein intake and decreasing simple carbohydrates.  [name] has agreed to follow-up with our clinic in 4 weeks. She was informed of the importance of frequent follow-up visits to maximize her success with intensive lifestyle modifications for her multiple health conditions.   [name] was informed we would discuss her lab results at her next visit unless there is a critical issue that needs to be addressed sooner. [name] agreed to keep her next visit at the agreed upon time to discuss these results.  Objective:   Blood pressure 119/80, pulse 96, temperature (!) 97.5 F (36.4 C), height 5' 7"$  (1.702 m), weight 229 lb 12.8 oz (104.2 kg), SpO2 97 %. Body mass index is 35.99 kg/m.  General: Cooperative, alert, well developed, in no acute distress. HEENT: Conjunctivae and lids unremarkable. Cardiovascular: Regular rhythm.  Lungs: Normal work of breathing. Neurologic: No focal deficits.   Lab Results  Component Value Date   CREATININE 1.22 (H) 04/23/2022   BUN 24 04/23/2022   NA 142 04/23/2022   K 4.8 04/23/2022   CL 103 04/23/2022   CO2 20  04/23/2022   Lab Results  Component Value Date   ALT 15 04/23/2022   AST 21 04/23/2022   ALKPHOS 100 04/23/2022   BILITOT 0.4 04/23/2022   Lab Results  Component Value Date   HGBA1C 5.2 04/23/2022   HGBA1C 5.4 12/02/2021   HGBA1C 5.2 02/25/2021   HGBA1C 5.6 01/31/2017   Lab Results  Component Value Date   INSULIN 16.5 04/23/2022   INSULIN 8.0 12/02/2021   INSULIN 17.2 04/09/2021   Lab Results  Component Value Date   TSH 1.57 08/13/2021   Lab Results  Component Value Date   CHOL 134 04/23/2022   HDL 55 04/23/2022   LDLCALC 67 04/23/2022   LDLDIRECT 321.8 02/27/2012   TRIG 57 04/23/2022   CHOLHDL 3.0 12/02/2021   Lab Results  Component Value Date   VD25OH 51.6 04/23/2022   VD25OH 43.8 12/02/2021   VD25OH 17.2 (L) 04/09/2021   Lab Results  Component Value Date   WBC 3.3 06/19/2021   HGB 13.9 06/19/2021   HCT 41 06/19/2021  MCV 100.8 (H) 02/26/2021   PLT 189 06/19/2021   Lab Results  Component Value Date   IRON 386 06/23/2017   TIBC 285 06/23/2017   FERRITIN 58 06/23/2017   Attestation Statements:   Reviewed by clinician on day of visit: allergies, medications, problem list, medical history, surgical history, family history, social history, and previous encounter notes.  I, Brendell Tyus, am acting as Location manager for [employer], DO.   I have reviewed the above documentation for accuracy and completeness, and I agree with the above. Marjory Sneddon, D.O.  The [place] was signed into law in 2016 which includes the topic of electronic health records.  This provides immediate access to information in MyChart.  This includes consultation notes, operative notes, office notes, lab results and pathology reports.  If you have any questions about what you read please let us know at your next visit so we can discuss your concerns and take corrective action if need be.  We are right here with you.

## 2022-05-14 ENCOUNTER — Other Ambulatory Visit (INDEPENDENT_AMBULATORY_CARE_PROVIDER_SITE_OTHER): Payer: Self-pay | Admitting: Family Medicine

## 2022-05-14 DIAGNOSIS — E539 Vitamin B deficiency, unspecified: Secondary | ICD-10-CM

## 2022-05-21 ENCOUNTER — Ambulatory Visit (INDEPENDENT_AMBULATORY_CARE_PROVIDER_SITE_OTHER): Payer: BC Managed Care – PPO | Admitting: Family Medicine

## 2022-05-22 ENCOUNTER — Ambulatory Visit: Payer: Medicare Other

## 2022-05-22 ENCOUNTER — Ambulatory Visit
Admission: RE | Admit: 2022-05-22 | Discharge: 2022-05-22 | Disposition: A | Discharge status: Home / Self Care | Payer: BC Managed Care – PPO | Source: Ambulatory Visit | Attending: Internal Medicine | Admitting: Internal Medicine

## 2022-05-22 DIAGNOSIS — R928 Other abnormal and inconclusive findings on diagnostic imaging of breast: Secondary | ICD-10-CM

## 2022-05-24 DIAGNOSIS — U071 COVID-19: Secondary | ICD-10-CM | POA: Diagnosis not present

## 2022-05-27 ENCOUNTER — Ambulatory Visit (INDEPENDENT_AMBULATORY_CARE_PROVIDER_SITE_OTHER): Payer: BC Managed Care – PPO | Admitting: Family Medicine

## 2022-05-28 NOTE — Progress Notes (Signed)
TeleHealth Visit:  This visit was completed with telemedicine (audio/video) technology. [name] has verbally consented to this TeleHealth visit. The patient is located at home, the provider is located at home. The participants in this visit include the listed provider and patient. The visit was conducted today via MyChart video.  OBESITY [name] is here to discuss her progress with her obesity treatment plan along with follow-up of her obesity related diagnoses.   Today's visit was # 17 Starting weight: 258 lbs Starting date: 04/09/2021 Weight at last in office visit: 229 lbs on 04/23/22 Total weight loss: 29 lbs at last in office visit on 04/23/22. Today's reported weight:  No weight reported.  Nutrition Plan: the Category 1 plan - 90-95% adherence.  Current exercise:  going to gym 60 minutes 2 times per week.  Interim History:  [name] has done very well with weight loss over the past year and has lost 29 pounds. She is adhering to plan exceptionally well and going to the gym a few times per week. She voices no particular issues today.  Eating all of the prescribed protein: yes  Skipping meals: No Drinking adequate water: Yes Drinking sugar sweetened beverages: rarely Hunger controlled: well controlled. Cravings controlled:  well controlled.   Pharmacotherapy: [name] is on Zepbound 5.0 mg SQ weekly.  Insurance paying part but still pays a large amount out-of-pocket. Adverse side effects: Constipation -uses dulcolax and mira lax, well controlled.  Hunger is well controlled.  Cravings are well controlled.  Assessment/Plan:  We discussed recent lab results in depth.  1. Hyperlipidemia LDL is at goal-67.  HDL and triglycerides are also at goal. Medication(s): Crestor 20 mg daily  Lab Results  Component Value Date   CHOL 134 04/23/2022   HDL 55 04/23/2022   LDLCALC 67 04/23/2022   LDLDIRECT 321.8 02/27/2012   TRIG 57 04/23/2022   CHOLHDL 3.0 12/02/2021   CHOLHDL  2.5 02/25/2021   CHOLHDL 2.3 11/14/2019   Lab Results  Component Value Date   ALT 15 04/23/2022   AST 21 04/23/2022   ALKPHOS 100 04/23/2022   BILITOT 0.4 04/23/2022   The 10-year ASCVD risk score (Arnett DK, et al., 2019) is: 11.9%   Values used to calculate the score:     Age: 67 years     Sex: Female     Is Non-Hispanic African American: Yes     Diabetic: No     Tobacco smoker: Yes     Systolic Blood Pressure: 123 mmHg     Is BP treated: Yes     HDL Cholesterol: 55 mg/dL     Total Cholesterol: 134 mg/dL  Plan: Continue Crestor 20 mg daily.  2. Vitamin D Deficiency Vitamin D is at goal of 50.  Most recent vitamin D level was 51.6 on 04/23/2022.Marland Kitchen She is on  prescription ergocalciferol 50,000 IU weekly. Lab Results  Component Value Date   VD25OH 51.6 04/23/2022   VD25OH 43.8 12/02/2021   VD25OH 17.2 (L) 04/09/2021    Plan: Refill prescription vitamin D 50,000 IU weekly.   3. Morbid Obesity: Current BMI 35 Pharmacotherapy Plan Continue and refill  Zepbound 5.0 mg SQ weekly [name] is currently in the action stage of change. As such, her goal is to continue with weight loss efforts.  She has agreed to the Category 1 plan.  Exercise goals: Add extra days of walking to do current regimen.  Behavioral modification strategies: planning for success.  [name] has agreed to follow-up with our clinic in 4  weeks.   No orders of the defined types were placed in this encounter.   Medications Discontinued During This Encounter  Medication Reason   Vitamin D, Ergocalciferol, (DRISDOL) 1.25 MG (50000 UNIT) CAPS capsule Reorder   tirzepatide (ZEPBOUND) 5 MG/0.5ML Pen Reorder     Meds ordered this encounter  Medications   tirzepatide (ZEPBOUND) 5 MG/0.5ML Pen    Sig: Inject 5 mg into the skin once a week.    Dispense:  2 mL    Refill:  0    Order Specific Question:   Supervising Provider    Answer:   Carolin Sicks   Vitamin D, Ergocalciferol, (DRISDOL) 1.25 MG  (50000 UNIT) CAPS capsule    Sig: Take 1 capsule (50,000 Units total) by mouth every 7 (seven) days.    Dispense:  8 capsule    Refill:  0    60 d supply;  ** OV for RF **   Do not send RF request    Order Specific Question:   Supervising Provider    Answer:   Glennis Brink [[id number]]      Objective:   VITALS: Per patient if applicable, see vitals. GENERAL: Alert and in no acute distress. CARDIOPULMONARY: No increased WOB. Speaking in clear sentences.  PSYCH: Pleasant and cooperative. Speech normal rate and rhythm. Affect is appropriate. Insight and judgement are appropriate. Attention is focused, linear, and appropriate.  NEURO: Oriented as arrived to appointment on time with no prompting.   Attestation Statements:   Reviewed by clinician on day of visit: allergies, medications, problem list, medical history, surgical history, family history, social history, and previous encounter notes.   This was prepared with the assistance of Engineer, civil (consulting).  Occasional wrong-word or sound-a-like substitutions may have occurred due to the inherent limitations of voice recognition software.

## 2022-05-29 ENCOUNTER — Telehealth (INDEPENDENT_AMBULATORY_CARE_PROVIDER_SITE_OTHER): Payer: BC Managed Care – PPO | Admitting: Family Medicine

## 2022-05-29 ENCOUNTER — Encounter (INDEPENDENT_AMBULATORY_CARE_PROVIDER_SITE_OTHER): Payer: Self-pay | Admitting: Family Medicine

## 2022-05-29 DIAGNOSIS — Z6835 Body mass index (BMI) 35.0-35.9, adult: Secondary | ICD-10-CM | POA: Diagnosis not present

## 2022-05-29 DIAGNOSIS — E559 Vitamin D deficiency, unspecified: Secondary | ICD-10-CM

## 2022-05-29 DIAGNOSIS — E7849 Other hyperlipidemia: Secondary | ICD-10-CM

## 2022-05-29 DIAGNOSIS — E785 Hyperlipidemia, unspecified: Secondary | ICD-10-CM

## 2022-05-29 MED ORDER — ZEPBOUND 5 MG/0.5ML ~~LOC~~ SOAJ: 5.0000 mg | SUBCUTANEOUS | 0 refills | Status: DC

## 2022-05-29 MED ORDER — VITAMIN D (ERGOCALCIFEROL) 1.25 MG (50000 UNIT) PO CAPS: 50000.0000 [IU] | ORAL_CAPSULE | ORAL | 0 refills | Status: DC

## 2022-06-02 ENCOUNTER — Telehealth: Payer: Self-pay

## 2022-06-02 NOTE — Telephone Encounter (Signed)
Nerstrand Nunley to schedule their annual wellness visit. Appointment made for 06/11/22.  Norton Blizzard, [place] ([facility])  [place] [phone]

## 2022-06-05 ENCOUNTER — Ambulatory Visit (INDEPENDENT_AMBULATORY_CARE_PROVIDER_SITE_OTHER): Payer: BC Managed Care – PPO | Admitting: Internal Medicine

## 2022-06-05 VITALS — BP 126/70 | HR 77 | Temp 97.6°F | Ht 67.0 in | Wt 225.0 lb

## 2022-06-05 DIAGNOSIS — E559 Vitamin D deficiency, unspecified: Secondary | ICD-10-CM

## 2022-06-05 DIAGNOSIS — J309 Allergic rhinitis, unspecified: Secondary | ICD-10-CM

## 2022-06-05 DIAGNOSIS — J019 Acute sinusitis, unspecified: Secondary | ICD-10-CM

## 2022-06-05 DIAGNOSIS — I10 Essential (primary) hypertension: Secondary | ICD-10-CM

## 2022-06-05 MED ORDER — DOXYCYCLINE HYCLATE 100 MG PO TABS: 100.0000 mg | ORAL_TABLET | Freq: Two times a day (BID) | ORAL | 0 refills | Status: DC

## 2022-06-05 MED ORDER — FLUTICASONE PROPIONATE 50 MCG/ACT NA SUSP: 2.0000 | Freq: Every day | NASAL | 6 refills | Status: DC

## 2022-06-05 NOTE — Patient Instructions (Signed)
Please take all new medication as prescribed - the antibiotic, and flonase  Please continue all other medications as before, and refills have been done if requested.  Please have the pharmacy call with any other refills you may need.  Please keep your appointments with your specialists as you may have planned

## 2022-06-05 NOTE — Progress Notes (Signed)
Patient ID: [name], female   DOB: 09-16-1955, 67 y.o.   MRN: [id number]        Chief Complaint: follow up sinus pain and congestion, allergies, htn       HPI:  [name] is a 67 y.o. female  Here with 2-3 days acute onset fever, facial pain, pressure, headache, general weakness and malaise, and greenish d/c, with mild ST and cough, but pt denies chest pain, wheezing, increased sob or doe, orthopnea, PND, increased LE swelling, palpitations, dizziness or syncope.  Does have several wks ongoing nasal allergy symptoms with clearish congestion, itch and sneezing, without fever, pain, ST, cough, swelling or wheezing.   Pt denies polydipsia, polyuria, or new focal neuro s/s.          Wt Readings from Last 3 Encounters:  06/05/22 225 lb (102.1 kg)  04/23/22 229 lb 12.8 oz (104.2 kg)  03/31/22 233 lb 3.2 oz (105.8 kg)   BP Readings from Last 3 Encounters:  06/05/22 126/70  04/23/22 119/80  03/31/22 126/85         Past Medical History:  Diagnosis Date   Back pain    Bilateral swelling of feet and ankles    CAD (coronary artery disease)    a. remote stents/angioplasty. b. s/p CABG 01/2017 with mitral valve annuloplasty.   Chest pain    Constipation    Diastolic heart failure (HCC)    Esophageal reflux    Gallbladder problem    GERD (gastroesophageal reflux disease)    History of MI (myocardial infarction)    Hyperlipidemia    Hypertension    Hypothyroidism    Immune thrombocytopenic purpura ([place])    Joint pain    Kidney problem    Lupus erythematosus    Mitral valve insufficiency and aortic valve insufficiency    Myocardial infarction (HCC)    x 2   Other fatigue    PONV (postoperative nausea and vomiting)    Postoperative atrial fibrillation (HCC)    Renal disease    Severe mitral regurgitation    a. s/p MV annuloplasty 01/2017 at time of CABG.   Shortness of breath on exertion    Sleep apnea    Status post mitral valve annuloplasty    Unspecified disease  of pericardium    Past Surgical History:  Procedure Laterality Date   CHOLECYSTECTOMY, LAPAROSCOPIC  2010   CORONARY ARTERY BYPASS GRAFT N/A 01/30/2017   Procedure: CORONARY ARTERY BYPASS GRAFTING (CABG), Times four  using the right saphaneous vein, harvested endoscopicly.  and left internal mammary artery .;  Surgeon: Gaye Pollack, MD;  Location: [facility];  Service: Open Heart Surgery;  Laterality: N/A;   LEFT HEART CATH AND CORONARY ANGIOGRAPHY N/A 01/23/2017   Procedure: LEFT HEART CATH AND CORONARY ANGIOGRAPHY;  Surgeon: Belva Crome, MD;  Location: [facility];  Service: Cardiovascular;  Laterality: N/A;   MITRAL VALVE REPAIR N/A 01/30/2017   Procedure: MITRAL VALVE REPAIR (MVR);  Surgeon: Gaye Pollack, MD;  Location: [facility];  Service: Open Heart Surgery;  Laterality: N/A;   plastic surgical repair      dog bite to leg   RIGHT/LEFT HEART CATH AND CORONARY/GRAFT ANGIOGRAPHY N/A 02/26/2021   Procedure: RIGHT/LEFT HEART CATH AND CORONARY/GRAFT ANGIOGRAPHY;  Surgeon: Belva Crome, MD;  Location: [facility];  Service: Cardiovascular;  Laterality: N/A;   SPLENECTOMY  5-04   TEE WITHOUT CARDIOVERSION N/A 01/27/2017   Procedure: TRANSESOPHAGEAL ECHOCARDIOGRAM (TEE);  Surgeon:  Croitoru, Dani Gobble, MD;  Location: [facility];  Service: Cardiovascular;  Laterality: N/A;   TEE WITHOUT CARDIOVERSION N/A 01/30/2017   Procedure: TRANSESOPHAGEAL ECHOCARDIOGRAM (TEE);  Surgeon: Gaye Pollack, MD;  Location: [facility];  Service: Open Heart Surgery;  Laterality: N/A;   ULTRASOUND GUIDANCE FOR VASCULAR ACCESS  01/23/2017   Procedure: Ultrasound Guidance For Vascular Access;  Surgeon: Belva Crome, MD;  Location: [facility];  Service: Cardiovascular;;    reports that she quit smoking about 6 years ago. Her smoking use included cigarettes. She started smoking about 36 years ago. She has a 30.00 pack-year smoking history. She has never used smokeless tobacco. She reports that she does not  drink alcohol and does not use drugs. family history includes Breast cancer in her mother and sister; Cancer in her mother and paternal grandfather; Fibromyalgia in her mother; GER disease in her father; Heart attack in her maternal grandmother; Heart disease in her father and mother; Hyperlipidemia in her mother; Hypertension in her father; Paget's disease of bone in her mother. Allergies  Allergen Reactions   Amlodipine Swelling    Leg edema   Cellcept [Mycophenolate] Nausea Only   Current Outpatient Medications on File Prior to Visit  Medication Sig Dispense Refill   albuterol (VENTOLIN HFA) 108 (90 Base) MCG/ACT inhaler Inhale 2 puffs into the lungs every 6 (six) hours as needed for wheezing or shortness of breath. 8 g 0   aspirin 81 MG chewable tablet Chew 1 tablet (81 mg total) by mouth daily.     azaTHIOprine (IMURAN) 50 MG tablet TAKE 1 TABLET BY MOUTH IN THE MORNING AND AT BEDTIME 180 tablet 0   cyclobenzaprine (FLEXERIL) 5 MG tablet Take 1 tablet (5 mg total) by mouth 3 (three) times daily as needed. 30 tablet 0   dapagliflozin propanediol (FARXIGA) 10 MG TABS tablet TAKE 1 TABLET BY MOUTH EVERY DAY 90 tablet 3   furosemide (LASIX) 20 MG tablet TAKE 1 TABLET BY MOUTH EVERY DAY 90 tablet 3   hydroxychloroquine (PLAQUENIL) 200 MG tablet Take 200 mg by mouth daily.      indapamide (LOZOL) 1.25 MG tablet TAKE 1 TABLET BY MOUTH EVERY DAY 90 tablet 0   isosorbide mononitrate (IMDUR) 30 MG 24 hr tablet TAKE 1 TABLET BY MOUTH EVERY DAY 90 tablet 3   levothyroxine (SYNTHROID) 75 MCG tablet TAKE 1 TABLET BY MOUTH DAILY BEFORE BREAKFAST. 90 tablet 1   metoprolol succinate (TOPROL-XL) 50 MG 24 hr tablet Take 1 tablet (50 mg total) by mouth daily. 90 tablet 3   nitroGLYCERIN (NITROSTAT) 0.4 MG SL tablet Place 1 tablet (0.4 mg total) under the tongue every 5 (five) minutes as needed for chest pain. 30 tablet 0   oxyCODONE (OXY IR/ROXICODONE) 5 MG immediate release tablet Take 1 tablet (5 mg total)  by mouth every 4 (four) hours as needed for moderate pain. 5 tablet 0   pantoprazole (PROTONIX) 40 MG tablet TAKE 1 TABLET BY MOUTH EVERY DAY 90 tablet 3   potassium chloride SA (KLOR-CON M) 20 MEQ tablet Take 1 tablet (20 mEq total) by mouth daily. 30 tablet 0   pregabalin (LYRICA) 50 MG capsule Take 50 mg by mouth 2 (two) times daily.     rosuvastatin (CRESTOR) 20 MG tablet Take 1 tablet (20 mg total) by mouth daily. 90 tablet 3   tirzepatide (ZEPBOUND) 5 MG/0.5ML Pen Inject 5 mg into the skin once a week. 2 mL 0   Vitamin D, Ergocalciferol, (DRISDOL) 1.25  MG (50000 UNIT) CAPS capsule Take 1 capsule (50,000 Units total) by mouth every 7 (seven) days. 8 capsule 0   No current facility-administered medications on file prior to visit.        ROS:  All others reviewed and negative.  Objective        PE:  BP 126/70   Pulse 77   Temp 97.6 F (36.4 C) (Oral)   Ht 5\' 7"  (1.702 m)   Wt 225 lb (102.1 kg)   SpO2 92%   BMI 35.24 kg/m                 Constitutional: Pt appears in NAD               HENT: Head: NCAT.                Right Ear: External ear normal.                 Left Ear: External ear normal. Bilat tm's with mild erythema.  Max sinus areas mild tender.  Pharynx with mild erythema, no exudate               Eyes: . Pupils are equal, round, and reactive to light. Conjunctivae and EOM are normal               Nose: without d/c or deformity               Neck: Neck supple. Gross normal ROM               Cardiovascular: Normal rate and regular rhythm.                 Pulmonary/Chest: Effort normal and breath sounds without rales or wheezing.                               Neurological: Pt is alert. At baseline orientation, motor grossly intact               Skin: Skin is warm. No rashes, no other new lesions, LE edema - none               Psychiatric: Pt behavior is normal without agitation   Micro: none  Cardiac tracings I have personally interpreted today:  none  Pertinent  Radiological findings (summarize): none   Lab Results  Component Value Date   WBC 3.3 06/19/2021   HGB 13.9 06/19/2021   HCT 41 06/19/2021   PLT 189 06/19/2021   GLUCOSE 80 04/23/2022   CHOL 134 04/23/2022   TRIG 57 04/23/2022   HDL 55 04/23/2022   LDLDIRECT 321.8 02/27/2012   LDLCALC 67 04/23/2022   ALT 15 04/23/2022   AST 21 04/23/2022   NA 142 04/23/2022   K 4.8 04/23/2022   CL 103 04/23/2022   CREATININE 1.22 (H) 04/23/2022   BUN 24 04/23/2022   CO2 20 04/23/2022   TSH 1.57 08/13/2021   INR 1.09 07/01/2017   HGBA1C 5.2 04/23/2022   Assessment/Plan:  Staci Doonan is a 67 y.o. Black or African American [2] female with  has a past medical history of Back pain, Bilateral swelling of feet and ankles, CAD (coronary artery disease), Chest pain, Constipation, Diastolic heart failure (HCC), Esophageal reflux, Gallbladder problem, GERD (gastroesophageal reflux disease), History of MI (myocardial infarction), Hyperlipidemia, Hypertension, Hypothyroidism, Immune thrombocytopenic purpura ([place]), Joint pain, Kidney problem, Lupus erythematosus, Mitral valve  insufficiency and aortic valve insufficiency, Myocardial infarction ([place]), Other fatigue, PONV (postoperative nausea and vomiting), Postoperative atrial fibrillation ([place]), Renal disease, Severe mitral regurgitation, Shortness of breath on exertion, Sleep apnea, Status post mitral valve annuloplasty, and Unspecified disease of pericardium.  Acute sinusitis Mild to mod, for antibx course doxycycline 100 bid,  to f/u any worsening symptoms or concerns   Allergic rhinitis Mild to mod, for restart flonase asd,  to f/u any worsening symptoms or concerns   Vitamin D deficiency Last vitamin D Lab Results  Component Value Date   VD25OH 51.6 04/23/2022   Stable, cont oral replacement   Hypertension BP Readings from Last 3 Encounters:  06/05/22 126/70  04/23/22 119/80  03/31/22 126/85   Stable, pt to continue medical treatment  lozol 1.25 qd, toprol xl 50 qd    Followup: Return if symptoms worsen or fail to improve.  Cathlean Cower, MD 06/08/2022 6:47 AM [facility]

## 2022-06-08 ENCOUNTER — Encounter: Payer: Self-pay | Admitting: Internal Medicine

## 2022-06-08 DIAGNOSIS — J019 Acute sinusitis, unspecified: Secondary | ICD-10-CM | POA: Insufficient documentation

## 2022-06-08 DIAGNOSIS — J309 Allergic rhinitis, unspecified: Secondary | ICD-10-CM | POA: Insufficient documentation

## 2022-06-08 NOTE — Assessment & Plan Note (Signed)
Mild to mod, for restart flonase asd, to f/u any worsening symptoms or concerns 

## 2022-06-08 NOTE — Assessment & Plan Note (Signed)
Last vitamin D Lab Results  Component Value Date   VD25OH 51.6 04/23/2022   Stable, cont oral replacement

## 2022-06-08 NOTE — Assessment & Plan Note (Signed)
BP Readings from Last 3 Encounters:  06/05/22 126/70  04/23/22 119/80  03/31/22 126/85   Stable, pt to continue medical treatment lozol 1.25 qd, toprol xl 50 qd

## 2022-06-08 NOTE — Assessment & Plan Note (Signed)
Mild to mod, for antibx course doxycycline 100 bid,  to f/u any worsening symptoms or concerns 

## 2022-06-12 ENCOUNTER — Ambulatory Visit: Payer: BC Managed Care – PPO

## 2022-06-12 VITALS — Ht 67.0 in | Wt 225.0 lb

## 2022-06-12 DIAGNOSIS — Z0000 Encounter for general adult medical examination without abnormal findings: Secondary | ICD-10-CM

## 2022-06-12 NOTE — Progress Notes (Signed)
I connected with  [name] on 06/12/22 by a audio enabled telemedicine application and verified that I am speaking with the correct person using two identifiers.  Patient Location: Home  Provider Location: Office/Clinic  I discussed the limitations of evaluation and management by telemedicine. The patient expressed understanding and agreed to proceed.  Subjective:   [name] is a 67 y.o. female who presents for Medicare Annual (Subsequent) preventive examination.  Review of Systems     Cardiac Risk Factors include: advanced age (>74men, >66 women);dyslipidemia;family history of premature cardiovascular disease;hypertension;obesity (BMI >30kg/m2)     Objective:    Today's Vitals   06/12/22 1502  Weight: 225 lb (102.1 kg)  Height: 5\' 7"  (1.702 m)  PainSc: 0-No pain   Body mass index is 35.24 kg/m.     06/12/2022    3:04 PM 08/03/2021    5:31 PM 06/10/2021    9:28 AM 04/04/2021    5:57 PM 02/25/2021    5:00 PM 06/08/2020    3:58 PM 02/02/2018   12:00 AM  Advanced Directives  Does Patient Have a Medical Advance Directive? Yes No Yes Yes No Yes Yes  Type of Paramedic of [place];Living will  Living will;Healthcare Power of [place];Living will     Does patient want to make changes to medical advance directive?   No - Patient declined   No - Patient declined No - Patient declined  Copy of [place] in Chart? No - copy requested  No - copy requested      Would patient like information on creating a medical advance directive?     No - Patient declined      Current Medications (verified) Outpatient Encounter Medications as of 06/12/2022  Medication Sig   albuterol (VENTOLIN HFA) 108 (90 Base) MCG/ACT inhaler Inhale 2 puffs into the lungs every 6 (six) hours as needed for wheezing or shortness of breath.   aspirin 81 MG chewable tablet Chew 1 tablet (81 mg total) by mouth daily.    azaTHIOprine (IMURAN) 50 MG tablet TAKE 1 TABLET BY MOUTH IN THE MORNING AND AT BEDTIME   cyclobenzaprine (FLEXERIL) 5 MG tablet Take 1 tablet (5 mg total) by mouth 3 (three) times daily as needed.   dapagliflozin propanediol (FARXIGA) 10 MG TABS tablet TAKE 1 TABLET BY MOUTH EVERY DAY   doxycycline (VIBRA-TABS) 100 MG tablet Take 1 tablet (100 mg total) by mouth 2 (two) times daily.   fluticasone (FLONASE) 50 MCG/ACT nasal spray Place 2 sprays into both nostrils daily.   furosemide (LASIX) 20 MG tablet TAKE 1 TABLET BY MOUTH EVERY DAY   hydroxychloroquine (PLAQUENIL) 200 MG tablet Take 200 mg by mouth daily.    indapamide (LOZOL) 1.25 MG tablet TAKE 1 TABLET BY MOUTH EVERY DAY   isosorbide mononitrate (IMDUR) 30 MG 24 hr tablet TAKE 1 TABLET BY MOUTH EVERY DAY   levothyroxine (SYNTHROID) 75 MCG tablet TAKE 1 TABLET BY MOUTH DAILY BEFORE BREAKFAST.   metoprolol succinate (TOPROL-XL) 50 MG 24 hr tablet Take 1 tablet (50 mg total) by mouth daily.   nitroGLYCERIN (NITROSTAT) 0.4 MG SL tablet Place 1 tablet (0.4 mg total) under the tongue every 5 (five) minutes as needed for chest pain.   oxyCODONE (OXY IR/ROXICODONE) 5 MG immediate release tablet Take 1 tablet (5 mg total) by mouth every 4 (four) hours as needed for moderate pain.   pantoprazole (PROTONIX) 40 MG tablet TAKE 1 TABLET BY  MOUTH EVERY DAY   potassium chloride SA (KLOR-CON M) 20 MEQ tablet Take 1 tablet (20 mEq total) by mouth daily.   pregabalin (LYRICA) 50 MG capsule Take 50 mg by mouth 2 (two) times daily.   rosuvastatin (CRESTOR) 20 MG tablet Take 1 tablet (20 mg total) by mouth daily.   tirzepatide (ZEPBOUND) 5 MG/0.5ML Pen Inject 5 mg into the skin once a week.   Vitamin D, Ergocalciferol, (DRISDOL) 1.25 MG (50000 UNIT) CAPS capsule Take 1 capsule (50,000 Units total) by mouth every 7 (seven) days.   No facility-administered encounter medications on file as of 06/12/2022.    Allergies (verified) Amlodipine and Cellcept  [mycophenolate]   History: Past Medical History:  Diagnosis Date   Back pain    Bilateral swelling of feet and ankles    CAD (coronary artery disease)    a. remote stents/angioplasty. b. s/p CABG 01/2017 with mitral valve annuloplasty.   Chest pain    Constipation    Diastolic heart failure (HCC)    Esophageal reflux    Gallbladder problem    GERD (gastroesophageal reflux disease)    History of MI (myocardial infarction)    Hyperlipidemia    Hypertension    Hypothyroidism    Immune thrombocytopenic purpura ([place])    Joint pain    Kidney problem    Lupus erythematosus    Mitral valve insufficiency and aortic valve insufficiency    Myocardial infarction (HCC)    x 2   Other fatigue    PONV (postoperative nausea and vomiting)    Postoperative atrial fibrillation (HCC)    Renal disease    Severe mitral regurgitation    a. s/p MV annuloplasty 01/2017 at time of CABG.   Shortness of breath on exertion    Sleep apnea    Status post mitral valve annuloplasty    Unspecified disease of pericardium    Past Surgical History:  Procedure Laterality Date   CHOLECYSTECTOMY, LAPAROSCOPIC  2010   CORONARY ARTERY BYPASS GRAFT N/A 01/30/2017   Procedure: CORONARY ARTERY BYPASS GRAFTING (CABG), Times four  using the right saphaneous vein, harvested endoscopicly.  and left internal mammary artery .;  Surgeon: Gaye Pollack, MD;  Location: [facility] OR;  Service: Open Heart Surgery;  Laterality: N/A;   LEFT HEART CATH AND CORONARY ANGIOGRAPHY N/A 01/23/2017   Procedure: LEFT HEART CATH AND CORONARY ANGIOGRAPHY;  Surgeon: Belva Crome, MD;  Location: [place] [facility];  Service: Cardiovascular;  Laterality: N/A;   MITRAL VALVE REPAIR N/A 01/30/2017   Procedure: MITRAL VALVE REPAIR (MVR);  Surgeon: Gaye Pollack, MD;  Location: [place];  Service: Open Heart Surgery;  Laterality: N/A;   plastic surgical repair      dog bite to leg   RIGHT/LEFT HEART CATH AND CORONARY/GRAFT ANGIOGRAPHY N/A 02/26/2021    Procedure: RIGHT/LEFT HEART CATH AND CORONARY/GRAFT ANGIOGRAPHY;  Surgeon: Belva Crome, MD;  Location: [place] [facility];  Service: Cardiovascular;  Laterality: N/A;   SPLENECTOMY  5-04   TEE WITHOUT CARDIOVERSION N/A 01/27/2017   Procedure: TRANSESOPHAGEAL ECHOCARDIOGRAM (TEE);  Surgeon: Sanda Klein, MD;  Location: [facility] ENDOSCOPY;  Service: Cardiovascular;  Laterality: N/A;   TEE WITHOUT CARDIOVERSION N/A 01/30/2017   Procedure: TRANSESOPHAGEAL ECHOCARDIOGRAM (TEE);  Surgeon: Gaye Pollack, MD;  Location: [place];  Service: Open Heart Surgery;  Laterality: N/A;   ULTRASOUND GUIDANCE FOR VASCULAR ACCESS  01/23/2017   Procedure: Ultrasound Guidance For Vascular Access;  Surgeon: Belva Crome, MD;  Location: [facility]  INVASIVE CV LAB;  Service: Cardiovascular;;   Family History  Problem Relation Age of Onset   Heart disease Mother    Hyperlipidemia Mother    Cancer Mother        breast and cervical   Paget's disease of bone Mother    Fibromyalgia Mother    Breast cancer Mother    Hypertension Father    Heart disease Father        PVD   GER disease Father    Breast cancer Sister    Heart attack Maternal Grandmother    Cancer Paternal Grandfather        colon   Social History   Socioeconomic History   Marital status: Single    Spouse name: Not on file   Number of children: 2   Years of education: Not on file   Highest education level: Master's degree (e.g., MA, MS, MEng, MEd, MSW, MBA)  Occupational History   OccupationPharmacist, community    Employer: [employer].   Tobacco Use   Smoking status: Former    Packs/day: 1.00    Years: 30.00    Additional pack years: 0.00    Total pack years: 30.00    Types: Cigarettes    Start date: 03/23/1986    Quit date: 12/23/2015    Years since quitting: 6.4   Smokeless tobacco: Never  Vaping Use   Vaping Use: Never used  Substance and Sexual Activity   Alcohol use: No   Drug use: No   Sexual activity:  Not Currently    Comment: 1st intercourse 67 yo-More than 5 partners  Other Topics Concern   Not on file  Social History Narrative   HSG,  graduated from [employer] in [place] state. In college [name] -grad '13 with relgious studies. [employer] - Fall '13 for MA-divinity. Marrried '73 - 2 years/ divorced. 2 son- '73, '75 - CP.    Occupation: full-time Ship broker. She lives alone with her younger son living with her part-time but he resides in a managed care facility.    Social Determinants of Health   Financial Resource Strain: Low Risk  (06/12/2022)   Overall Financial Resource Strain (CARDIA)    Difficulty of Paying Living Expenses: Not hard at all  Food Insecurity: No Food Insecurity (06/12/2022)   Hunger Vital Sign    Worried About Running Out of Food in the Last Year: Never true    Ran Out of Food in the Last Year: Never true  Transportation Needs: No Transportation Needs (06/12/2022)   PRAPARE - Hydrologist (Medical): No    Lack of Transportation (Non-Medical): No  Physical Activity: Sufficiently Active (06/12/2022)   Exercise Vital Sign    Days of Exercise per Week: 3 days    Minutes of Exercise per Session: 60 min  Stress: No Stress Concern Present (06/12/2022)   [place]    Feeling of Stress : Not at all  Social Connections: Moderately Integrated (06/12/2022)   Social Connection and Isolation Panel [NHANES]    Frequency of Communication with Friends and Family: More than three times a week    Frequency of Social Gatherings with Friends and Family: More than three times a week    Attends Religious Services: More than 4 times per year    Active Member of [employer] or Organizations: Yes    Attends Archivist Meetings: More than 4 times per  year    Marital Status: Never married    Tobacco Counseling Counseling given: Not Answered   Clinical Intake:  Pre-visit  preparation completed: Yes  Pain : No/denies pain Pain Score: 0-No pain     BMI - recorded: 35.24 Nutritional Status: BMI > 30  Obese Nutritional Risks: None Diabetes: No  How often do you need to have someone help you when you read instructions, pamphlets, or other written materials from your doctor or pharmacy?: 1 - Never What is the last grade level you completed in school?: MASTER'S DEGREE IN DIVIN[employer]  Diabetic? No  Interpreter Needed?: No  Information entered by :: Lisette Abu, LPN.   Activities of Daily Living    06/12/2022    3:08 PM  In your present state of health, do you have any difficulty performing the following activities:  Hearing? 0  Vision? 0  Difficulty concentrating or making decisions? 0  Walking or climbing stairs? 0  Dressing or bathing? 0  Doing errands, shopping? 0  Preparing Food and eating ? N  Using the Toilet? N  In the past six months, have you accidently leaked urine? N  Do you have problems with loss of bowel control? N  Managing your Medications? N  Managing your Finances? N  Housekeeping or managing your Housekeeping? N    Patient Care Team: Janith Lima, MD as PCP - General (Internal Medicine) Belva Crome, MD (Inactive) as PCP - Cardiology (Cardiology) Edrick Oh, MD (Nephrology) Gavin Pound, MD (Rheumatology) Gerda Diss, DO as Referring Physician (Sports Medicine) Camillo Flaming, OD as Referring Physician (Optometry)  Indicate any recent Medical Services you may have received from other than [facility] providers in the past year (date may be approximate).     Assessment:   This is a routine wellness examination for [name].  Hearing/Vision screen Hearing Screening - Comments:: Denies hearing difficulties   Vision Screening - Comments:: Wears contacts - up to date with routine eye exams with Camillo Flaming, OD.   Dietary issues and exercise activities discussed: Current Exercise Habits: Home exercise routine,  Type of exercise: walking;treadmill;stretching;strength training/weights;exercise ball;calisthenics;Other - see comments (Goes to gym 2 days per week and walking on the 3rd day), Time (Minutes): 60, Frequency (Times/Week): 3, Weekly Exercise (Minutes/Week): 180, Intensity: Moderate, Exercise limited by: None identified   Goals Addressed             This Visit's Progress    My goal for 2024 is to maintain good cardiac health, continue to lose weight and build up my muscle tone.        Depression Screen    06/12/2022    3:07 PM 07/08/2021    9:43 AM 06/10/2021    9:32 AM 04/09/2021   10:01 AM 06/08/2020    3:52 PM 11/14/2019    2:58 PM 10/25/2018    1:44 PM  PHQ 2/9 Scores  PHQ - 2 Score 0 0 0 2 0 0 0  PHQ- 9 Score 0 0  7       Fall Risk    06/12/2022    3:16 PM 06/12/2022    3:05 PM 06/10/2021    9:28 AM 06/08/2020    3:58 PM 11/14/2019    2:57 PM  [place] in the past year? 0 0 0 0 0  Number falls in past yr: 0 0 0 0 0  Injury with Fall? 0 0 0 0 0  Risk for fall due to : No  Fall Risks No Fall Risks No Fall Risks No Fall Risks   Follow up Falls prevention discussed Falls prevention discussed Falls evaluation completed Falls evaluation completed Falls evaluation completed    [place]:  Any stairs in or around the home? No  If so, are there any without handrails? No  Home free of loose throw rugs in walkways, pet beds, electrical cords, etc? Yes  Adequate lighting in your home to reduce risk of falls? Yes   ASSISTIVE DEVICES UTILIZED TO PREVENT FALLS:  Life alert? No  Use of a cane, walker or w/c? No  Grab bars in the bathroom? No  Shower chair or bench in shower? No  Elevated toilet seat or a handicapped toilet? No   TIMED UP AND GO:  Was the test performed? No . Telephonic Visit  Cognitive Function:        06/12/2022    3:08 PM  6CIT Screen  What Year? 0 points  What month? 0 points  What time? 0 points  Count back  from 20 0 points  Months in reverse 0 points  Repeat phrase 0 points  Total Score 0 points    Immunizations Immunization History  Administered Date(s) Administered   Covid-19, Mrna,Vaccine(Spikevax)45yrs and older 02/26/2022   Influenza Whole 01/21/2008, 01/16/2009, 11/21/2013   Influenza, High Dose Seasonal PF 01/12/2022   Influenza, Seasonal, Injecte, Preservative Fre 01/14/2017   Influenza,inj,Quad PF,6+ Mos 12/09/2018   Influenza-Unspecified 12/25/2015, 01/07/2018, 11/20/2020   Moderna Covid-19 Vaccine Bivalent Booster 57yrs & up 07/26/2021   Moderna Sars-Covid-2 Vaccination 03/31/2019, 04/29/2019, 11/04/2019, 03/31/2020   PNEUMOCOCCAL CONJUGATE-20 04/03/2021   PPD Test 09/13/2012, 09/15/2012, 04/15/2017, 03/30/2020   Pneumococcal Polysaccharide-23 01/16/2009, 02/23/2014, 10/25/2018   Tdap 05/22/2011   Zoster Recombinat (Shingrix) 11/13/2021    TDAP status: Due, Education has been provided regarding the importance of this vaccine. Advised may receive this vaccine at local pharmacy or Health Dept. Aware to provide a copy of the vaccination record if obtained from local pharmacy or Health Dept. Verbalized acceptance and understanding.  Flu Vaccine status: Up to date  Pneumococcal vaccine status: Up to date  Covid-19 vaccine status: Completed vaccines  Qualifies for Shingles Vaccine? Yes   Zostavax completed No   Shingrix Completed?: No.    Education has been provided regarding the importance of this vaccine. Patient has been advised to call insurance company to determine out of pocket expense if they have not yet received this vaccine. Advised may also receive vaccine at local pharmacy or Health Dept. Verbalized acceptance and understanding.  Screening Tests Health Maintenance  Topic Date Due   DTaP/Tdap/Td (2 - Td or Tdap) 05/21/2021   Zoster Vaccines- Shingrix (2 of 2) 01/08/2022   Lung Cancer Screening  02/25/2022   Medicare Annual Wellness (AWV)  06/12/2023    MAMMOGRAM  05/06/2024   Fecal DNA (Cologuard)  05/16/2024   Pneumonia Vaccine 48+ Years old  Completed   INFLUENZA VACCINE  Completed   DEXA SCAN  Completed   Hepatitis C Screening  Completed   HPV VACCINES  Aged Out   COLONOSCOPY (Pts 45-7yrs Insurance coverage will need to be confirmed)  Discontinued   COVID-19 Vaccine  Discontinued    Health Maintenance  Health Maintenance Due  Topic Date Due   DTaP/Tdap/Td (2 - Td or Tdap) 05/21/2021   Zoster Vaccines- Shingrix (2 of 2) 01/08/2022   Lung Cancer Screening  02/25/2022    Colorectal cancer screening: Type of screening: Cologuard. Completed 05/27/2021. Repeat  every 3 years  Mammogram status: Completed 05/22/2022. Repeat every year  Bone Density status: Completed 10/19/2017. Results reflect: Bone density results: NORMAL. Repeat every 5 years.  Lung Cancer Screening: (Low Dose CT Chest recommended if Age 26-80 years, 30 pack-year currently smoking OR have quit w/in 15years.) does qualify.   Lung Cancer Screening Referral: patient will speak with PCP.  Additional Screening:  Hepatitis C Screening: does qualify; Completed 05/27/2017  Vision Screening: Recommended annual ophthalmology exams for early detection of glaucoma and other disorders of the eye. Is the patient up to date with their annual eye exam?  Yes  Who is the provider or what is the name of the office in which the patient attends annual eye exams? Camillo Flaming, OD. If pt is not established with a provider, would they like to be referred to a provider to establish care? No .   Dental Screening: Recommended annual dental exams for proper oral hygiene  Community Resource Referral / Chronic Care Management: CRR required this visit?  No   CCM required this visit?  No      Plan:     I have personally reviewed and noted the following in the patient's chart:   Medical and social history Use of alcohol, tobacco or illicit drugs  Current medications and supplements  including opioid prescriptions. Patient is currently taking opioid prescriptions. Information provided to patient regarding non-opioid alternatives. Patient advised to discuss non-opioid treatment plan with their provider. Functional ability and status Nutritional status Physical activity Advanced directives List of other physicians Hospitalizations, surgeries, and ER visits in previous 12 months Vitals Screenings to include cognitive, depression, and falls Referrals and appointments  In addition, I have reviewed and discussed with patient certain preventive protocols, quality metrics, and best practice recommendations. A written personalized care plan for preventive services as well as general preventive health recommendations were provided to patient.     Sheral Flow, LPN   [id number]   Nurse Notes: Normal cognitive status assessed by direct observation by this Nurse Health Advisor. No abnormalities found.

## 2022-06-12 NOTE — Patient Instructions (Signed)
[name] , Thank you for taking time to come for your Medicare Wellness Visit. I appreciate your ongoing commitment to your health goals. Please review the following plan we discussed and let me know if I can assist you in the future.   These are the goals we discussed:  Goals      My goal for 2024 is to maintain good cardiac health, continue to lose weight and build up my muscle tone.        This is a list of the screening recommended for you and due dates:  Health Maintenance  Topic Date Due   DTaP/Tdap/Td vaccine (2 - Td or Tdap) 05/21/2021   Zoster (Shingles) Vaccine (2 of 2) 01/08/2022   Screening for Lung Cancer  02/25/2022   Medicare Annual Wellness Visit  06/12/2023   Mammogram  05/06/2024   Cologuard (Stool DNA test)  05/16/2024   Pneumonia Vaccine  Completed   Flu Shot  Completed   DEXA scan (bone density measurement)  Completed   Hepatitis C Screening: USPSTF Recommendation to screen - Ages 49-79 yo.  Completed   HPV Vaccine  Aged Out   Colon Cancer Screening  Discontinued   COVID-19 Vaccine  Discontinued    Advanced directives: Yes; Patient's family is aware of her wishes.  Conditions/risks identified: Yes; Continue to maintain weight by staying physically active and motivated.  Next appointment: Follow up in one year for your annual wellness visit.   Preventive Care 51 Years and Older, Female Preventive care refers to lifestyle choices and visits with your health care provider that can promote health and wellness. What does preventive care include? A yearly physical exam. This is also called an annual well check. Dental exams once or twice a year. Routine eye exams. Ask your health care provider how often you should have your eyes checked. Personal lifestyle choices, including: Daily care of your teeth and gums. Regular physical activity. Eating a healthy diet. Avoiding tobacco and drug use. Limiting alcohol use. Practicing safe sex. Taking low-dose  aspirin every day. Taking vitamin and mineral supplements as recommended by your health care provider. What happens during an annual well check? The services and screenings done by your health care provider during your annual well check will depend on your age, overall health, lifestyle risk factors, and family history of disease. Counseling  Your health care provider may ask you questions about your: Alcohol use. Tobacco use. Drug use. Emotional well-being. Home and relationship well-being. Sexual activity. Eating habits. History of falls. Memory and ability to understand (cognition). Work and work Statistician. Reproductive health. Screening  You may have the following tests or measurements: Height, weight, and BMI. Blood pressure. Lipid and cholesterol levels. These may be checked every 5 years, or more frequently if you are over 28 years old. Skin check. Lung cancer screening. You may have this screening every year starting at age 55 if you have a 30-pack-year history of smoking and currently smoke or have quit within the past 15 years. Fecal occult blood test (FOBT) of the stool. You may have this test every year starting at age 35. Flexible sigmoidoscopy or colonoscopy. You may have a sigmoidoscopy every 5 years or a colonoscopy every 10 years starting at age 3. Hepatitis C blood test. Hepatitis B blood test. Sexually transmitted disease (STD) testing. Diabetes screening. This is done by checking your blood sugar (glucose) after you have not eaten for a while (fasting). You may have this done every 1-3 years. Bone density  scan. This is done to screen for osteoporosis. You may have this done starting at age 71. Mammogram. This may be done every 1-2 years. Talk to your health care provider about how often you should have regular mammograms. Talk with your health care provider about your test results, treatment options, and if necessary, the need for more tests. Vaccines  Your  health care provider may recommend certain vaccines, such as: Influenza vaccine. This is recommended every year. Tetanus, diphtheria, and acellular pertussis (Tdap, Td) vaccine. You may need a Td booster every 10 years. Zoster vaccine. You may need this after age 59. Pneumococcal 13-valent conjugate (PCV13) vaccine. One dose is recommended after age 75. Pneumococcal polysaccharide (PPSV23) vaccine. One dose is recommended after age 87. Talk to your health care provider about which screenings and vaccines you need and how often you need them. This information is not intended to replace advice given to you by your health care provider. Make sure you discuss any questions you have with your health care provider. Document Released: 04/06/2015 Document Revised: 11/28/2015 Document Reviewed: 01/09/2015 Elsevier Interactive Patient Education  2017 [place] Prevention in the Home Falls can cause injuries. They can happen to people of all ages. There are many things you can do to make your home safe and to help prevent falls. What can I do on the outside of my home? Regularly fix the edges of walkways and driveways and fix any cracks. Remove anything that might make you trip as you walk through a door, such as a raised step or threshold. Trim any bushes or trees on the path to your home. Use bright outdoor lighting. Clear any walking paths of anything that might make someone trip, such as rocks or tools. Regularly check to see if handrails are loose or broken. Make sure that both sides of any steps have handrails. Any raised decks and porches should have guardrails on the edges. Have any leaves, snow, or ice cleared regularly. Use sand or salt on walking paths during winter. Clean up any spills in your garage right away. This includes oil or grease spills. What can I do in the bathroom? Use night lights. Install grab bars by the toilet and in the tub and shower. Do not use towel bars as  grab bars. Use non-skid mats or decals in the tub or shower. If you need to sit down in the shower, use a plastic, non-slip stool. Keep the floor dry. Clean up any water that spills on the floor as soon as it happens. Remove soap buildup in the tub or shower regularly. Attach bath mats securely with double-sided non-slip rug tape. Do not have throw rugs and other things on the floor that can make you trip. What can I do in the bedroom? Use night lights. Make sure that you have a light by your bed that is easy to reach. Do not use any sheets or blankets that are too big for your bed. They should not hang down onto the floor. Have a firm chair that has side arms. You can use this for support while you get dressed. Do not have throw rugs and other things on the floor that can make you trip. What can I do in the kitchen? Clean up any spills right away. Avoid walking on wet floors. Keep items that you use a lot in easy-to-reach places. If you need to reach something above you, use a strong step stool that has a grab bar. Keep electrical  cords out of the way. Do not use floor polish or wax that makes floors slippery. If you must use wax, use non-skid floor wax. Do not have throw rugs and other things on the floor that can make you trip. What can I do with my stairs? Do not leave any items on the stairs. Make sure that there are handrails on both sides of the stairs and use them. Fix handrails that are broken or loose. Make sure that handrails are as long as the stairways. Check any carpeting to make sure that it is firmly attached to the stairs. Fix any carpet that is loose or worn. Avoid having throw rugs at the top or bottom of the stairs. If you do have throw rugs, attach them to the floor with carpet tape. Make sure that you have a light switch at the top of the stairs and the bottom of the stairs. If you do not have them, ask someone to add them for you. What else can I do to help prevent  falls? Wear shoes that: Do not have high heels. Have rubber bottoms. Are comfortable and fit you well. Are closed at the toe. Do not wear sandals. If you use a stepladder: Make sure that it is fully opened. Do not climb a closed stepladder. Make sure that both sides of the stepladder are locked into place. Ask someone to hold it for you, if possible. Clearly mark and make sure that you can see: Any grab bars or handrails. First and last steps. Where the edge of each step is. Use tools that help you move around (mobility aids) if they are needed. These include: Canes. Walkers. Scooters. Crutches. Turn on the lights when you go into a dark area. Replace any light bulbs as soon as they burn out. Set up your furniture so you have a clear path. Avoid moving your furniture around. If any of your floors are uneven, fix them. If there are any pets around you, be aware of where they are. Review your medicines with your doctor. Some medicines can make you feel dizzy. This can increase your chance of falling. Ask your doctor what other things that you can do to help prevent falls. This information is not intended to replace advice given to you by your health care provider. Make sure you discuss any questions you have with your health care provider. Document Released: 01/04/2009 Document Revised: 08/16/2015 Document Reviewed: 04/14/2014 Elsevier Interactive Patient Education  2017 [employer].

## 2022-06-23 ENCOUNTER — Other Ambulatory Visit (INDEPENDENT_AMBULATORY_CARE_PROVIDER_SITE_OTHER): Payer: Self-pay | Admitting: Family Medicine

## 2022-06-23 DIAGNOSIS — E559 Vitamin D deficiency, unspecified: Secondary | ICD-10-CM

## 2022-06-26 ENCOUNTER — Encounter (INDEPENDENT_AMBULATORY_CARE_PROVIDER_SITE_OTHER): Payer: Self-pay | Admitting: Family Medicine

## 2022-06-26 ENCOUNTER — Ambulatory Visit (INDEPENDENT_AMBULATORY_CARE_PROVIDER_SITE_OTHER): Payer: BC Managed Care – PPO | Admitting: Family Medicine

## 2022-06-26 VITALS — BP 128/86 | HR 64 | Temp 97.9°F | Ht 66.0 in | Wt 219.8 lb

## 2022-06-26 DIAGNOSIS — E88819 Insulin resistance, unspecified: Secondary | ICD-10-CM

## 2022-06-26 DIAGNOSIS — K5903 Drug induced constipation: Secondary | ICD-10-CM | POA: Insufficient documentation

## 2022-06-26 DIAGNOSIS — Z6835 Body mass index (BMI) 35.0-35.9, adult: Secondary | ICD-10-CM | POA: Insufficient documentation

## 2022-06-26 DIAGNOSIS — E539 Vitamin B deficiency, unspecified: Secondary | ICD-10-CM | POA: Insufficient documentation

## 2022-06-26 DIAGNOSIS — Z6836 Body mass index (BMI) 36.0-36.9, adult: Secondary | ICD-10-CM

## 2022-06-26 DIAGNOSIS — E7849 Other hyperlipidemia: Secondary | ICD-10-CM

## 2022-06-26 DIAGNOSIS — I10 Essential (primary) hypertension: Secondary | ICD-10-CM | POA: Diagnosis not present

## 2022-06-26 DIAGNOSIS — E559 Vitamin D deficiency, unspecified: Secondary | ICD-10-CM | POA: Diagnosis not present

## 2022-06-26 MED ORDER — ZEPBOUND 5 MG/0.5ML ~~LOC~~ SOAJ: 5.0000 mg | SUBCUTANEOUS | 1 refills | Status: DC

## 2022-06-26 MED ORDER — VITAMIN D (ERGOCALCIFEROL) 1.25 MG (50000 UNIT) PO CAPS: 50000.0000 [IU] | ORAL_CAPSULE | ORAL | 0 refills | Status: DC

## 2022-06-26 MED ORDER — ZEPBOUND 5 MG/0.5ML ~~LOC~~ SOAJ: 5.0000 mg | SUBCUTANEOUS | 0 refills | Status: DC

## 2022-06-26 NOTE — Progress Notes (Signed)
[name], D.O.  ABFM, ABOM Specializing in Clinical Bariatric Medicine  Office located at: [address]  [place], [place]  [phone]     Assessment and Plan:   Medications Discontinued During This Encounter  Medication Reason   tirzepatide (ZEPBOUND) 5 MG/0.5ML Pen Reorder   Vitamin D, Ergocalciferol, (DRISDOL) 1.25 MG (50000 UNIT) CAPS capsule Reorder   tirzepatide (ZEPBOUND) 5 MG/0.5ML Pen Reorder     Meds ordered this encounter  Medications   Vitamin D, Ergocalciferol, (DRISDOL) 1.25 MG (50000 UNIT) CAPS capsule    Sig: Take 1 capsule (50,000 Units total) by mouth every 7 (seven) days.    Dispense:  8 capsule    Refill:  0    60 d supply;  ** OV for RF **   Do not send RF request   DISCONTD: tirzepatide (ZEPBOUND) 5 MG/0.5ML Pen    Sig: Inject 5 mg into the skin once a week.    Dispense:  2 mL    Refill:  0   tirzepatide (ZEPBOUND) 5 MG/0.5ML Pen    Sig: Inject 5 mg into the skin once a week.    Dispense:  2 mL    Refill:  1       Morbid obesity (HCC)-starting bmi 41.64 BMI 35.0-35.9,adult Assessment: Condition is Improving, but not optimized.. Biometric data collected today, was reviewed with patient.  Fat mass has decreased by 1lb. Muscle mass has decreased by 8.4 lb. Total body water has decreased by 1.2lb.  Patient reports experiencing constipation as a side effect of medications.   Plan: Continue category 1 meal plan. -Continue Zepbound 5 mg weekly. Will refill this today, pt declines need for increase in dose today.  -Increase MiraLax to 2x daily.      Other hyperlipidemia Assessment: Condition is At goal..  Lab Results  Component Value Date   CHOL 134 04/23/2022   HDL 55 04/23/2022   LDLCALC 67 04/23/2022   LDLDIRECT 321.8 02/27/2012   TRIG 57 04/23/2022   CHOLHDL 3.0 12/02/2021   Last lipid panel as above.  Plan: continue lifestyle and diet changes. Continue Crestor 20 mg daily.  [name] agrees to continue with meds  and/or our treatment plan of a heart-heathy, low cholesterol meal plan - Cardiovascular risk and specific lipid/LDL goals reviewed. - We extensively discussed several lifestyle modifications today and she will continue to work on diet, exercise and weight loss efforts.  - I stressed the importance that patient continue with our prudent nutritional plan that is low in saturated and trans fats, and low in fatty carbs to improve these numbers.  - We recommend: aerobic activity with eventual goal of a minimum of 150+ min wk plus 2 days/ week of resistance or strength training.   - We will continue routine screening as patient continues to achieve health goals along their weight loss journey     Drug Induced Constipation Assessment: Condition is Not at goal. Patient is experiencing constipation as a side effect of Zepbound.    Plan:   [name] was informed that a decrease in bowel movement frequency is normal while losing weight, but stools should not be hard or painful.  - Patient advised to Increase MiraLax to 2x daily. She will titrate for one softer, normal caliber bowel movement each day.    - Discussed addition of laxatives such as Peri-colace and Colace as needed for refractory constipation.   - Adequate daily water intake encouraged along with activity/ movement  -  Bowel habits and good bowel hygiene discussed with patient      Insulin resistance with polyphagia and carb cravings Assessment: Condition is Improving, stable.  Lab Results  Component Value Date   HGBA1C 5.2 04/23/2022   HGBA1C 5.4 12/02/2021   HGBA1C 5.2 02/25/2021   INSULIN 16.5 04/23/2022   INSULIN 8.0 12/02/2021   INSULIN 17.2 04/09/2021    Plan:Continue Zepbound 5 mg weekly, declines need for increase dose. Will refill this today.  - I counseled patient on pathophysiology of the disease process of I.R.  - Stressed importance of dietary and lifestyle modifications to result in weight loss as first line txmnt - In  addition, we discussed the risks and benefits of various medication options which can help [name] in the management of this disease process as well as with weight loss.  Will consider starting one of these meds in future as we will focus on prudent nutritional plan at this time.  - Continue to decrease simple carbs/ sugars; increase fiber and proteins -> follow her meal plan.   - Explained role of simple carbs and insulin levels on hunger and cravings - All concerns/questions addressed.   - Anticipatory guidance given.   - [name] will continue to work on weight loss, exercise, via their meal plan we devised to help decrease the risk of progressing to diabetes.  - We will recheck A1c and fasting insulin level in approximately 3 months from last check, or as deemed appropriate.     Vitamin D Deficiency  Assessment: Condition is At goal.. Labs were reviewed.  Lab Results  Component Value Date   VD25OH 51.6 04/23/2022   VD25OH 43.8 12/02/2021   VD25OH 17.2 (L) 04/09/2021   Plan:Continue Ergocalciferol 50K IU weekly. Will refill this today.  - I discussed the importance of vitamin D to the patient's health and well-being.  - I reviewed possible symptoms of low Vitamin D:  low energy, depressed mood, muscle aches, joint aches, osteoporosis etc. was reviewed with patient - low Vitamin D levels may be linked to an increased risk of cardiovascular events and even increased risk of cancers- such as colon and breast.  - ideal vitamin D levels reviewed with patient   - Informed patient this may be a lifelong thing, and she was encouraged to continue to take the medicine until told otherwise.    - weight loss will likely improve availability of vitamin D, thus encouraged [name] to continue with meal plan and their weight loss efforts to further improve this condition.  Thus, we will need to monitor levels regularly (every 3-4 mo on average) to keep levels within normal limits and prevent over  supplementation. - pt's questions and concerns regarding this condition addressed.      Essential HTN  Assessment: Condition is Improving, stable. Labs were reviewed.  Last 3 blood pressure readings in our office are as follows: BP Readings from Last 3 Encounters:  06/26/22 128/86  06/05/22 126/70  04/23/22 119/80   The 10-year ASCVD risk score (Arnett DK, et al., 2019) is: 6.7%  Lab Results  Component Value Date   CREATININE 1.22 (H) 04/23/2022  Patient denies any dizziness, visual imparity, and any other symptoms.    Plan:Continue medication per PCP recommendation.  BP is at goal today.  Counseled Candise Che Youtsey on pathophysiology of disease and discussed treatment plan, which always includes dietary and lifestyle modification as first line.  Lifestyle changes such as following our low salt, heart healthy meal plan and engaging  in a regular exercise program discussed  - Avoid buying foods that are: processed, frozen, or prepackaged to avoid excess salt. - Ambulatory blood pressure monitoring encouraged.  Reminded patient that if they ever feel poorly in any way, to check their blood pressure and pulse as well. - We will continue to monitor closely alongside PCP/ specialists.  Pt reminded to also f/up with those individuals as instructed by them.  - We will continue to monitor symptoms as they relate to the her weight loss journey.    TREATMENT PLAN FOR OBESITY:  Recommended Dietary Goals [name] is currently in the action stage of change. As such, her goal is to continue weight management plan. She has agreed to continue the Category 1 Plan.  Behavioral Intervention Additional resources provided today: patient declined Evidence-based interventions for health behavior change were utilized today including the discussion of self monitoring techniques, problem-solving barriers and SMART goal setting techniques.   Regarding patient's less desirable eating habits and patterns,  we employed the technique of small changes.  Pt will specifically work on: increase exercise from 2 to 3 days a week for next visit.    Recommended Physical Activity Goals [name] has been advised to work up to 150 minutes of moderate intensity aerobic activity a week and strengthening exercises 2-3 times per week for cardiovascular health, weight loss maintenance and preservation of muscle mass.  She has agreed to Continue current level of physical activity   Pharmacotherapy We discussed various medication options to help [name] with her weight loss efforts and we both agreed to continue Zepbound at current dose.   FOLLOW UP: Return in about 4 weeks (around 07/24/2022). She was informed of the importance of frequent follow up visits to maximize her success with intensive lifestyle modifications for her multiple health conditions.     Subjective:   Chief complaint: Obesity [name] is here to discuss her progress with her obesity treatment plan. She is on the the Category 1 Plan and states she is following her eating plan approximately 95% of the time. She states she is exercising 60 minutes 2 days per week.  Interval History:  [name] is here for a follow up office visit. We reviewed her meal plan and all questions were answered. Patient's food recall appears to be accurate and consistent with what is on plan when she is following it. When eating on plan, her hunger and cravings are well controlled.     Since last office visit she reports that she is following her meal plan and having an adequate intake of protein. She reports experiencing constipation and is doing mira lax once daily. She states she's increasing her exercising intake to 3 days a week.  Pharmacotherapy for weight loss: She is currently taking Zepbound for medical weight loss.  She's been experiencing constipation.  Review of Systems:  Pertinent positives were addressed with patient today.  Weight Summary and  Biometrics   Weight Lost Since Last Visit: 10lb  No data recorded   Vitals Temp: 97.9 F (36.6 C) BP: 128/86 Pulse Rate: 64 SpO2: 98 %   Anthropometric Measurements Height: 5\' 6"  (1.676 m) Weight: 219 lb 12.8 oz (99.7 kg) BMI (Calculated): 35.49 Weight at Last Visit: 229lb Weight Lost Since Last Visit: 10lb Starting Weight: 258lb Total Weight Loss (lbs): 39 lb (17.7 kg) Peak Weight: 280lb   Body Composition  Body Fat %: 49.1 % Fat Mass (lbs): 108 lbs Muscle Mass (lbs): 106.2 lbs Total Body Water (lbs): 76.4 lbs  Visceral Fat Rating : 15   Other Clinical Data Fasting: no Labs: no Today's Visit #: 18 Starting Date: 04/09/21    Objective:   PHYSICAL EXAM: Blood pressure 128/86, pulse 64, temperature 97.9 F (36.6 C), height 5\' 6"  (1.676 m), weight 219 lb 12.8 oz (99.7 kg), SpO2 98 %. Body mass index is 35.48 kg/m.  General: Well Developed, well nourished, and in no acute distress.  HEENT: Normocephalic, atraumatic Skin: Warm and dry, cap RF less 2 sec, good turgor Chest:  Normal excursion, shape, no gross abn Respiratory: speaking in full sentences, no conversational dyspnea NeuroM-Sk: Ambulates w/o assistance, moves * 4 Psych: A and O *3, insight good, mood-full  DIAGNOSTIC DATA REVIEWED: BMET    Component Value Date/Time   NA 142 04/23/2022 1456   K 4.8 04/23/2022 1456   CL 103 04/23/2022 1456   CO2 20 04/23/2022 1456   GLUCOSE 80 04/23/2022 1456   GLUCOSE 82 04/03/2021 1053   BUN 24 04/23/2022 1456   CREATININE 1.22 (H) 04/23/2022 1456   CREATININE 1.17 (H) 02/22/2021 1501   CALCIUM 9.4 04/23/2022 1456   GFRNONAA 45 (L) 03/07/2021 1248   GFRAA 52 (L) 08/04/2018 0921   Lab Results  Component Value Date   HGBA1C 5.2 04/23/2022   HGBA1C 5.6 01/31/2017   Lab Results  Component Value Date   INSULIN 16.5 04/23/2022   INSULIN 17.2 04/09/2021   Lab Results  Component Value Date   TSH 1.57 08/13/2021   CBC    Component Value Date/Time    WBC 3.3 06/19/2021 0000   WBC 3.4 (L) 02/26/2021 0108   RBC 4.23 06/19/2021 0000   HGB 13.9 06/19/2021 0000   HCT 41 06/19/2021 0000   PLT 189 06/19/2021 0000   MCV 100.8 (H) 02/26/2021 0108   MCH 33.7 02/26/2021 0108   MCHC 33.4 02/26/2021 0108   RDW 15.5 02/26/2021 0108   Iron Studies    Component Value Date/Time   IRON 386 06/23/2017 0000   TIBC 285 06/23/2017 0000   FERRITIN 58 06/23/2017 0000   IRONPCTSAT 26 06/23/2017 0000   Lipid Panel     Component Value Date/Time   CHOL 134 04/23/2022 1456   TRIG 57 04/23/2022 1456   HDL 55 04/23/2022 1456   CHOLHDL 3.0 12/02/2021 0905   CHOLHDL 2.5 02/25/2021 0757   VLDL 7 02/25/2021 0757   LDLCALC 67 04/23/2022 1456   LDLCALC 68 11/14/2019 1537   LDLDIRECT 321.8 02/27/2012 1202   Hepatic Function Panel     Component Value Date/Time   PROT 7.2 04/23/2022 1456   ALBUMIN 4.2 04/23/2022 1456   AST 21 04/23/2022 1456   ALT 15 04/23/2022 1456   ALKPHOS 100 04/23/2022 1456   BILITOT 0.4 04/23/2022 1456   BILIDIR 0.1 08/08/2020 1615   BILIDIR 0.10 11/03/2016 1155   IBILI 0.2 11/14/2019 1537      Component Value Date/Time   TSH 1.57 08/13/2021 0000   TSH 1.60 04/03/2021 1053   Nutritional Lab Results  Component Value Date   VD25OH 51.6 04/23/2022   VD25OH 43.8 12/02/2021   VD25OH 17.2 (L) 04/09/2021    Attestations:   Reviewed by clinician on day of visit: allergies, medications, problem list, medical history, surgical history, family history, social history, and previous encounter notes.  I,Safa M Kadhim,acting as a scribe for Thomasene Lot, DO.,have documented all relevant documentation on the behalf of Thomasene Lot, DO,as directed by  Thomasene Lot, DO while in the presence of [employer], DO.  Andrena Mews, DO, have reviewed all documentation for this visit. The documentation on 06/26/22 for the exam, diagnosis, procedures, and orders are all accurate and complete.

## 2022-06-30 DIAGNOSIS — M5451 Vertebrogenic low back pain: Secondary | ICD-10-CM | POA: Diagnosis not present

## 2022-06-30 DIAGNOSIS — M5416 Radiculopathy, lumbar region: Secondary | ICD-10-CM | POA: Diagnosis not present

## 2022-06-30 DIAGNOSIS — M79662 Pain in left lower leg: Secondary | ICD-10-CM | POA: Diagnosis not present

## 2022-06-30 DIAGNOSIS — M5116 Intervertebral disc disorders with radiculopathy, lumbar region: Secondary | ICD-10-CM | POA: Diagnosis not present

## 2022-07-12 ENCOUNTER — Other Ambulatory Visit: Payer: Self-pay | Admitting: Internal Medicine

## 2022-07-22 ENCOUNTER — Ambulatory Visit (INDEPENDENT_AMBULATORY_CARE_PROVIDER_SITE_OTHER): Payer: BC Managed Care – PPO | Admitting: Family Medicine

## 2022-07-22 ENCOUNTER — Other Ambulatory Visit (INDEPENDENT_AMBULATORY_CARE_PROVIDER_SITE_OTHER): Payer: Self-pay | Admitting: Family Medicine

## 2022-07-22 ENCOUNTER — Encounter (INDEPENDENT_AMBULATORY_CARE_PROVIDER_SITE_OTHER): Payer: Self-pay | Admitting: Family Medicine

## 2022-07-22 VITALS — BP 97/65 | HR 72 | Temp 98.1°F | Ht 66.0 in | Wt 220.6 lb

## 2022-07-22 DIAGNOSIS — R632 Polyphagia: Secondary | ICD-10-CM

## 2022-07-22 DIAGNOSIS — Z6836 Body mass index (BMI) 36.0-36.9, adult: Secondary | ICD-10-CM

## 2022-07-22 DIAGNOSIS — E88819 Insulin resistance, unspecified: Secondary | ICD-10-CM

## 2022-07-22 DIAGNOSIS — E559 Vitamin D deficiency, unspecified: Secondary | ICD-10-CM

## 2022-07-22 MED ORDER — VITAMIN D (ERGOCALCIFEROL) 1.25 MG (50000 UNIT) PO CAPS: 50000.0000 [IU] | ORAL_CAPSULE | ORAL | 0 refills | Status: DC

## 2022-07-22 MED ORDER — ZEPBOUND 5 MG/0.5ML ~~LOC~~ SOAJ: 5.0000 mg | SUBCUTANEOUS | 0 refills | Status: DC

## 2022-07-22 NOTE — Progress Notes (Signed)
[name], D.O.  ABFM, ABOM Specializing in Clinical Bariatric Medicine  Office located at: [address] [place], [place]  [address]     Assessment and Plan:   No orders of the defined types were placed in this encounter.   Medications Discontinued During This Encounter  Medication Reason   Vitamin D, Ergocalciferol, (DRISDOL) 1.25 MG (50000 UNIT) CAPS capsule Reorder   tirzepatide (ZEPBOUND) 5 MG/0.5ML Pen Reorder     Meds ordered this encounter  Medications   tirzepatide (ZEPBOUND) 5 MG/0.5ML Pen    Sig: Inject 5 mg into the skin once a week.    Dispense:  2 mL    Refill:  0   Vitamin D, Ergocalciferol, (DRISDOL) 1.25 MG (50000 UNIT) CAPS capsule    Sig: Take 1 capsule (50,000 Units total) by mouth every 7 (seven) days.    Dispense:  4 capsule    Refill:  0    60 d supply;  ** OV for RF **   Do not send RF request     Insulin resistance with polyphagia and carb cravings Assessment: Condition is improving, but not optimized. Lab Results  Component Value Date   HGBA1C 5.2 04/23/2022   HGBA1C 5.4 12/02/2021   HGBA1C 5.2 02/25/2021   INSULIN 16.5 04/23/2022   INSULIN 8.0 12/02/2021   INSULIN 17.2 04/09/2021  - Patient is not on any medicine for this condition. This is currently diet controlled. - She endorses that her hunger and cravings are controlled when eating on plan.   Plan:  - Continue to decrease simple carbs/ sugars; increase fiber and proteins -> follow her meal plan.   - Elizaveta will continue to work on weight loss, exercise, via their meal plan we devised to help decrease the risk of progressing to diabetes.     Vitamin D deficiency Assessment: Condition is stable.  Lab Results  Component Value Date   VD25OH 51.6 04/23/2022   VD25OH 43.8 12/02/2021   VD25OH 17.2 (L) 04/09/2021  - No issues with Ergocalciferol 50K IU weekly. Denies any side effects.  Plan: - Continue with med as prescribed. Will refill this today.   - weight loss  will likely improve availability of vitamin D, thus encouraged Ailish to continue with meal plan and their weight loss efforts to further improve this condition.  Thus, we will need to monitor levels regularly (every 3-4 mo on average) to keep levels within normal limits and prevent over supplementation.  TREATMENT PLAN FOR OBESITY: BMI 36.0-36.9,adult-current bmi 35.62 Morbid obesity (HCC)-starting bmi 41.64/date 04/09/21 Assessment: Condition is improving, but not optimized. Biometric data collected today, was reviewed with patient.  Fat mass has decreased by 1.2 lb. Muscle mass has increased by 1.8 lb. Total body water has increased by 2.2 lb.  - She has been compliant with Zepbound 5 mg weekly. Denies any side effects.  - She endorses that the "food noise in her head" has quieted down.  - Her hunger and cravings are well controlled.   Plan:  - Continue with med. Will refill this today.  Patient denies need for dose change. I informed Jashiya she may need to contact various pharmacies to obtain the Zepbound because there is a Acupuncturist on GLP1s.  - Carsen is currently in the action stage of change. As such, her goal is to continue weight management plan. - Addalee will work on healthier eating habits and continue the Category 1 meal plan.  - I recommended patient to increase  her protein and water intake on the days she exercises.   Behavioral Intervention Additional resources provided today: patient declined Evidence-based interventions for health behavior change were utilized today including the discussion of self monitoring techniques, problem-solving barriers and SMART goal setting techniques.   Regarding patient's less desirable eating habits and patterns, we employed the technique of small changes.  Pt will specifically work on: continue adherence to prudent nutritional plan for next visit.    Recommended Physical Activity Goals Lucillia has been advised to work up to 150 minutes  of moderate intensity aerobic activity a week and strengthening exercises 2-3 times per week for cardiovascular health, weight loss maintenance and preservation of muscle mass.  She has agreed to Continue current level of physical activity   FOLLOW UP: Return in about 3 weeks (around 08/12/2022). She was informed of the importance of frequent follow up visits to maximize her success with intensive lifestyle modifications for her multiple health conditions.  Subjective:   Chief complaint: Obesity Tomie is here to discuss her progress with her obesity treatment plan. She is on the the Category 1 Plan and states she is following her eating plan approximately 98% of the time. She states she is exercising (cardio/weights) 60 minutes 2-3 days per week.   Interval History:  Maygen Sirico is here for a follow up office visit.  Since last office visit: - She endorses that she has been drinking a lot more water recently.  - She has been mostly eating everything on the meal plan.  - She is concerned about obtaining Zepbound due to then national drug shortage. - When eating on plan, her hunger and cravings are well controlled.      Pharmacotherapy for weight loss: She is currently taking Zepbound for medical weight loss.  Denies side effects.    Review of Systems:  Pertinent positives were addressed with patient today.   Weight Summary and Biometrics   Weight Lost Since Last Visit: 0  Weight Gained Since Last Visit: 1 lb    Vitals Temp: 98.1 F (36.7 C) BP: 97/65 Pulse Rate: 72 SpO2: 95 %   Anthropometric Measurements Height: 5\' 6"  (1.676 m) Weight: 220 lb 9.6 oz (100.1 kg) BMI (Calculated): 35.62 Weight at Last Visit: 219 lb Weight Lost Since Last Visit: 0 Weight Gained Since Last Visit: 1 lb Starting Weight: 258 lb Total Weight Loss (lbs): 38 lb (17.2 kg) Peak Weight: 280 lb   Body Composition  Body Fat %: 48.4 % Fat Mass (lbs): 106.8 lbs Muscle Mass (lbs): 108  lbs Total Body Water (lbs): 78.8 lbs Visceral Fat Rating : 15   Other Clinical Data Fasting: No Labs: No Today's Visit #: 19 Starting Date: 04/09/21   Objective:   PHYSICAL EXAM: Blood pressure 97/65, pulse 72, temperature 98.1 F (36.7 C), height 5\' 6"  (1.676 m), weight 220 lb 9.6 oz (100.1 kg), SpO2 95 %. Body mass index is 35.61 kg/m.  General: Well Developed, well nourished, and in no acute distress.  HEENT: Normocephalic, atraumatic Skin: Warm and dry, cap RF less 2 sec, good turgor Chest:  Normal excursion, shape, no gross abn Respiratory: speaking in full sentences, no conversational dyspnea NeuroM-Sk: Ambulates w/o assistance, moves * 4 Psych: A and O *3, insight good, mood-full  DIAGNOSTIC DATA REVIEWED:  BMET    Component Value Date/Time   NA 142 04/23/2022 1456   K 4.8 04/23/2022 1456   CL 103 04/23/2022 1456   CO2 20 04/23/2022 1456   GLUCOSE 80  04/23/2022 1456   GLUCOSE 82 04/03/2021 1053   BUN 24 04/23/2022 1456   CREATININE 1.22 (H) 04/23/2022 1456   CREATININE 1.17 (H) 02/22/2021 1501   CALCIUM 9.4 04/23/2022 1456   GFRNONAA 45 (L) 03/07/2021 1248   GFRAA 52 (L) 08/04/2018 0921   Lab Results  Component Value Date   HGBA1C 5.2 04/23/2022   HGBA1C 5.6 01/31/2017   Lab Results  Component Value Date   INSULIN 16.5 04/23/2022   INSULIN 17.2 04/09/2021   Lab Results  Component Value Date   TSH 1.57 08/13/2021   CBC    Component Value Date/Time   WBC 3.3 06/19/2021 0000   WBC 3.4 (L) 02/26/2021 0108   RBC 4.23 06/19/2021 0000   HGB 13.9 06/19/2021 0000   HCT 41 06/19/2021 0000   PLT 189 06/19/2021 0000   MCV 100.8 (H) 02/26/2021 0108   MCH 33.7 02/26/2021 0108   MCHC 33.4 02/26/2021 0108   RDW 15.5 02/26/2021 0108   Iron Studies    Component Value Date/Time   IRON 386 06/23/2017 0000   TIBC 285 06/23/2017 0000   FERRITIN 58 06/23/2017 0000   IRONPCTSAT 26 06/23/2017 0000   Lipid Panel     Component Value Date/Time   CHOL  134 04/23/2022 1456   TRIG 57 04/23/2022 1456   HDL 55 04/23/2022 1456   CHOLHDL 3.0 12/02/2021 0905   CHOLHDL 2.5 02/25/2021 0757   VLDL 7 02/25/2021 0757   LDLCALC 67 04/23/2022 1456   LDLCALC 68 11/14/2019 1537   LDLDIRECT 321.8 02/27/2012 1202   Hepatic Function Panel     Component Value Date/Time   PROT 7.2 04/23/2022 1456   ALBUMIN 4.2 04/23/2022 1456   AST 21 04/23/2022 1456   ALT 15 04/23/2022 1456   ALKPHOS 100 04/23/2022 1456   BILITOT 0.4 04/23/2022 1456   BILIDIR 0.1 08/08/2020 1615   BILIDIR 0.10 11/03/2016 1155   IBILI 0.2 11/14/2019 1537      Component Value Date/Time   TSH 1.57 08/13/2021 0000   TSH 1.60 04/03/2021 1053   Nutritional Lab Results  Component Value Date   VD25OH 51.6 04/23/2022   VD25OH 43.8 12/02/2021   VD25OH 17.2 (L) 04/09/2021    Attestations:   Reviewed by clinician on day of visit: allergies, medications, problem list, medical history, surgical history, family history, social history, and previous encounter notes.  I,Special Puri,acting as a Neurosurgeon for [employer], DO.,have documented all relevant documentation on the behalf of Thomasene Lot, DO,as directed by  Thomasene Lot, DO while in the presence of Thomasene Lot, DO.   I, Thomasene Lot, DO, have reviewed all documentation for this visit. The documentation on 07/22/22 for the exam, diagnosis, procedures, and orders are all accurate and complete.

## 2022-08-12 ENCOUNTER — Ambulatory Visit (INDEPENDENT_AMBULATORY_CARE_PROVIDER_SITE_OTHER): Payer: BC Managed Care – PPO | Admitting: Family Medicine

## 2022-08-12 ENCOUNTER — Encounter (INDEPENDENT_AMBULATORY_CARE_PROVIDER_SITE_OTHER): Payer: Self-pay | Admitting: Family Medicine

## 2022-08-12 ENCOUNTER — Other Ambulatory Visit (INDEPENDENT_AMBULATORY_CARE_PROVIDER_SITE_OTHER): Payer: Self-pay | Admitting: Family Medicine

## 2022-08-12 VITALS — BP 130/81 | HR 68 | Temp 98.2°F | Ht 66.0 in | Wt 218.0 lb

## 2022-08-12 DIAGNOSIS — Z6836 Body mass index (BMI) 36.0-36.9, adult: Secondary | ICD-10-CM

## 2022-08-12 DIAGNOSIS — E559 Vitamin D deficiency, unspecified: Secondary | ICD-10-CM

## 2022-08-12 DIAGNOSIS — Z6835 Body mass index (BMI) 35.0-35.9, adult: Secondary | ICD-10-CM

## 2022-08-12 DIAGNOSIS — E88819 Insulin resistance, unspecified: Secondary | ICD-10-CM | POA: Diagnosis not present

## 2022-08-12 MED ORDER — VITAMIN D (ERGOCALCIFEROL) 1.25 MG (50000 UNIT) PO CAPS: 50000.0000 [IU] | ORAL_CAPSULE | ORAL | 0 refills | Status: DC

## 2022-08-12 MED ORDER — WEGOVY 0.5 MG/0.5ML ~~LOC~~ SOAJ: 0.5000 mg | SUBCUTANEOUS | 0 refills | Status: DC

## 2022-08-12 NOTE — Progress Notes (Signed)
[name], D.O.  ABFM, ABOM Specializing in Clinical Bariatric Medicine  Office located at: [address] [place], [place]  [address]     Assessment and Plan:   Medications Discontinued During This Encounter  Medication Reason   tirzepatide (ZEPBOUND) 5 MG/0.5ML Pen Cost of medication   Vitamin D, Ergocalciferol, (DRISDOL) 1.25 MG (50000 UNIT) CAPS capsule Reorder     Meds ordered this encounter  Medications   Vitamin D, Ergocalciferol, (DRISDOL) 1.25 MG (50000 UNIT) CAPS capsule    Sig: Take 1 capsule (50,000 Units total) by mouth every 7 (seven) days.    Dispense:  4 capsule    Refill:  0    60 d supply;  ** OV for RF **   Do not send RF request   Semaglutide-Weight Management (WEGOVY) 0.5 MG/0.5ML SOAJ    Sig: Inject 0.5 mg into the skin once a week.    Dispense:  2 mL    Refill:  0    Recheck Vitamin D, B12, fasting Insulin next OV   Vitamin D deficiency Assessment: Condition is stable. Lab Results  Component Value Date   VD25OH 51.6 04/23/2022   VD25OH 43.8 12/02/2021   VD25OH 17.2 (L) 04/09/2021  - She reports good compliance and tolerance with Ergocalciferol 50K IU weekly. Denies any side effects.  Plan: - Continue with med. Will reorder this today.   - Continue to monitor levels regularly  to keep levels within normal limits and prevent over supplementation.   Insulin resistance with polyphagia and carb cravings Assessment: Condition is not optimized. Lab Results  Component Value Date   HGBA1C 5.2 04/23/2022   HGBA1C 5.4 12/02/2021   HGBA1C 5.2 02/25/2021   INSULIN 16.5 04/23/2022   INSULIN 8.0 12/02/2021   INSULIN 17.2 04/09/2021  - Patient has not been taking Zepbound, unable to obtain due to national drug shortage. This condition is currently diet controlled.  - Pt endorses not having any cravings currently.   Plan: - Begin Wegovy. Risks/benefits of starting this medicine were reviewed with pt. I informed the patient that Reginal Lutes is  very high in demand and low in supply. If her insurance does coverage the medication, patient is also aware about the difficulties of finding a pharmacy that has the medication. All questions were answered appropriately.   -  Kimberla will continue to work on weight loss, exercise, via their meal plan we devised to help decrease the risk of progressing to diabetes.    TREATMENT PLAN FOR OBESITY: BMI 36.0-36.9,adult-current bmi 35.2 Morbid obesity (HCC)-starting bmi 41.64/date 04/09/21 Assessment:  Magic Capener is here to discuss her progress with her obesity treatment plan along with follow-up of her obesity related diagnoses. See Medical Weight Management Flowsheet for complete bioelectrical impedance results.  Condition is improving, but not optimized. Biometric data collected today, was reviewed with patient.   Since last office visit on 07/22/22 patient's Fat mass has decreased by 1.8 lb. Muscle mass has decreased by 0.4 lb. Total body water has increased by 1 lb.  Counseling done on how various foods will affect these numbers and how to maximize success  Total lbs lost to date: 40 Total weight loss percentage to date: 15.50   Plan:  - Miraya will work on healthier eating habits and continue with the Category 1 meal plan.    Behavioral Intervention Additional resources provided today: patient declined Evidence-based interventions for health behavior change were utilized today including the discussion of self monitoring techniques, problem-solving  barriers and SMART goal setting techniques.   Regarding patient's less desirable eating habits and patterns, we employed the technique of small changes.  Pt will specifically work on: continue adherence to prudent nutritional plan for next visit.    Recommended Physical Activity Goals  Alexsia has been advised to slowly work up to 150 minutes of moderate intensity aerobic activity a week and strengthening exercises 2-3 times per week for  cardiovascular health, weight loss maintenance and preservation of muscle mass.   She has agreed to Continue current level of physical activity   FOLLOW UP: Return in about 4 weeks (around 09/09/2022). She was informed of the importance of frequent follow up visits to maximize her success with intensive lifestyle modifications for her multiple health conditions.  Subjective:   Chief complaint: Obesity Mireyli is here to discuss her progress with her obesity treatment plan. She is on the the Category 1 Plan and states she is following her eating plan approximately 98% of the time. She states she is exercising (cardio and weights) 60 minutes 3 days per week.  Interval History:  Faryal Behr is here for a follow up office visit.     Since last office visit:   - She was not able to find a pharmacy that had Zepbound. - When eating on plan, her hunger and cravings are well controlled.      Review of Systems:  Pertinent positives were addressed with patient today.  Weight Summary and Biometrics   Weight Lost Since Last Visit: 2lb  No data recorded   Vitals Temp: 98.2 F (36.8 C) BP: 130/81 Pulse Rate: 68 SpO2: 92 %   Anthropometric Measurements Height: 5\' 6"  (1.676 m) Weight: 218 lb (98.9 kg) BMI (Calculated): 35.2 Weight at Last Visit: 220lb Weight Lost Since Last Visit: 2lb Starting Weight: 258lb Total Weight Loss (lbs): 39 lb (17.7 kg)   Body Composition  Body Fat %: 48.1 % Fat Mass (lbs): 105 lbs Muscle Mass (lbs): 107.6 lbs Total Body Water (lbs): 79.8 lbs Visceral Fat Rating : 14   Other Clinical Data Fasting: no Labs: no Today's Visit #: 20 Starting Date: 04/09/21   Objective:   PHYSICAL EXAM: Blood pressure 130/81, pulse 68, temperature 98.2 F (36.8 C), height 5\' 6"  (1.676 m), weight 218 lb (98.9 kg), SpO2 92 %. Body mass index is 35.19 kg/m.  General: Well Developed, well nourished, and in no acute distress.  HEENT: Normocephalic,  atraumatic Skin: Warm and dry, cap RF less 2 sec, good turgor Chest:  Normal excursion, shape, no gross abn Respiratory: speaking in full sentences, no conversational dyspnea NeuroM-Sk: Ambulates w/o assistance, moves * 4 Psych: A and O *3, insight good, mood-full  DIAGNOSTIC DATA REVIEWED:  BMET    Component Value Date/Time   NA 142 04/23/2022 1456   K 4.8 04/23/2022 1456   CL 103 04/23/2022 1456   CO2 20 04/23/2022 1456   GLUCOSE 80 04/23/2022 1456   GLUCOSE 82 04/03/2021 1053   BUN 24 04/23/2022 1456   CREATININE 1.22 (H) 04/23/2022 1456   CREATININE 1.17 (H) 02/22/2021 1501   CALCIUM 9.4 04/23/2022 1456   GFRNONAA 45 (L) 03/07/2021 1248   GFRAA 52 (L) 08/04/2018 0921   Lab Results  Component Value Date   HGBA1C 5.2 04/23/2022   HGBA1C 5.6 01/31/2017   Lab Results  Component Value Date   INSULIN 16.5 04/23/2022   INSULIN 17.2 04/09/2021   Lab Results  Component Value Date   TSH 1.57 08/13/2021  CBC    Component Value Date/Time   WBC 3.3 06/19/2021 0000   WBC 3.4 (L) 02/26/2021 0108   RBC 4.23 06/19/2021 0000   HGB 13.9 06/19/2021 0000   HCT 41 06/19/2021 0000   PLT 189 06/19/2021 0000   MCV 100.8 (H) 02/26/2021 0108   MCH 33.7 02/26/2021 0108   MCHC 33.4 02/26/2021 0108   RDW 15.5 02/26/2021 0108   Iron Studies    Component Value Date/Time   IRON 386 06/23/2017 0000   TIBC 285 06/23/2017 0000   FERRITIN 58 06/23/2017 0000   IRONPCTSAT 26 06/23/2017 0000   Lipid Panel     Component Value Date/Time   CHOL 134 04/23/2022 1456   TRIG 57 04/23/2022 1456   HDL 55 04/23/2022 1456   CHOLHDL 3.0 12/02/2021 0905   CHOLHDL 2.5 02/25/2021 0757   VLDL 7 02/25/2021 0757   LDLCALC 67 04/23/2022 1456   LDLCALC 68 11/14/2019 1537   LDLDIRECT 321.8 02/27/2012 1202   Hepatic Function Panel     Component Value Date/Time   PROT 7.2 04/23/2022 1456   ALBUMIN 4.2 04/23/2022 1456   AST 21 04/23/2022 1456   ALT 15 04/23/2022 1456   ALKPHOS 100 04/23/2022  1456   BILITOT 0.4 04/23/2022 1456   BILIDIR 0.1 08/08/2020 1615   BILIDIR 0.10 11/03/2016 1155   IBILI 0.2 11/14/2019 1537      Component Value Date/Time   TSH 1.57 08/13/2021 0000   TSH 1.60 04/03/2021 1053   Nutritional Lab Results  Component Value Date   VD25OH 51.6 04/23/2022   VD25OH 43.8 12/02/2021   VD25OH 17.2 (L) 04/09/2021    Attestations:   Reviewed by clinician on day of visit: allergies, medications, problem list, medical history, surgical history, family history, social history, and previous encounter notes.   I,Special Puri,acting as a Neurosurgeon for [employer], DO.,have documented all relevant documentation on the behalf of Thomasene Lot, DO,as directed by  Thomasene Lot, DO while in the presence of Thomasene Lot, DO.   I, Thomasene Lot, DO, have reviewed all documentation for this visit. The documentation on 08/12/22 for the exam, diagnosis, procedures, and orders are all accurate and complete.

## 2022-08-14 DIAGNOSIS — E039 Hypothyroidism, unspecified: Secondary | ICD-10-CM | POA: Diagnosis not present

## 2022-08-14 DIAGNOSIS — N055 Unspecified nephritic syndrome with diffuse mesangiocapillary glomerulonephritis: Secondary | ICD-10-CM | POA: Diagnosis not present

## 2022-08-14 DIAGNOSIS — D631 Anemia in chronic kidney disease: Secondary | ICD-10-CM | POA: Diagnosis not present

## 2022-08-14 DIAGNOSIS — R809 Proteinuria, unspecified: Secondary | ICD-10-CM | POA: Diagnosis not present

## 2022-08-14 DIAGNOSIS — N181 Chronic kidney disease, stage 1: Secondary | ICD-10-CM | POA: Diagnosis not present

## 2022-08-14 DIAGNOSIS — I2510 Atherosclerotic heart disease of native coronary artery without angina pectoris: Secondary | ICD-10-CM | POA: Diagnosis not present

## 2022-08-14 DIAGNOSIS — M329 Systemic lupus erythematosus, unspecified: Secondary | ICD-10-CM | POA: Diagnosis not present

## 2022-08-14 DIAGNOSIS — I25810 Atherosclerosis of coronary artery bypass graft(s) without angina pectoris: Secondary | ICD-10-CM | POA: Diagnosis not present

## 2022-08-14 DIAGNOSIS — I4891 Unspecified atrial fibrillation: Secondary | ICD-10-CM | POA: Diagnosis not present

## 2022-09-11 ENCOUNTER — Other Ambulatory Visit (INDEPENDENT_AMBULATORY_CARE_PROVIDER_SITE_OTHER): Payer: Self-pay | Admitting: Family Medicine

## 2022-09-11 ENCOUNTER — Ambulatory Visit (INDEPENDENT_AMBULATORY_CARE_PROVIDER_SITE_OTHER): Payer: BC Managed Care – PPO | Admitting: Family Medicine

## 2022-09-11 ENCOUNTER — Encounter (INDEPENDENT_AMBULATORY_CARE_PROVIDER_SITE_OTHER): Payer: Self-pay | Admitting: Family Medicine

## 2022-09-11 VITALS — HR 69 | Temp 97.8°F | Ht 66.0 in | Wt 216.0 lb

## 2022-09-11 DIAGNOSIS — E539 Vitamin B deficiency, unspecified: Secondary | ICD-10-CM

## 2022-09-11 DIAGNOSIS — I10 Essential (primary) hypertension: Secondary | ICD-10-CM

## 2022-09-11 DIAGNOSIS — Z6836 Body mass index (BMI) 36.0-36.9, adult: Secondary | ICD-10-CM

## 2022-09-11 DIAGNOSIS — E559 Vitamin D deficiency, unspecified: Secondary | ICD-10-CM

## 2022-09-11 DIAGNOSIS — E88819 Insulin resistance, unspecified: Secondary | ICD-10-CM

## 2022-09-11 DIAGNOSIS — Z6834 Body mass index (BMI) 34.0-34.9, adult: Secondary | ICD-10-CM

## 2022-09-11 MED ORDER — VITAMIN D (ERGOCALCIFEROL) 1.25 MG (50000 UNIT) PO CAPS: 50000.0000 [IU] | ORAL_CAPSULE | ORAL | 0 refills | Status: DC

## 2022-09-11 MED ORDER — TIRZEPATIDE 2.5 MG/0.5ML ~~LOC~~ SOAJ: 2.5000 mg | SUBCUTANEOUS | 0 refills | Status: DC

## 2022-09-11 NOTE — Progress Notes (Signed)
[name], D.O.  ABFM, ABOM Specializing in Clinical Bariatric Medicine  Office located at: [address] [place], [place]  [address]     Assessment and Plan:   Orders Placed This Encounter  Procedures   VITAMIN D 25 Hydroxy (Vit-D Deficiency, Fractures)   Insulin, random   Vitamin B12    Medications Discontinued During This Encounter  Medication Reason   Vitamin D, Ergocalciferol, (DRISDOL) 1.25 MG (50000 UNIT) CAPS capsule Reorder   Semaglutide-Weight Management (WEGOVY) 0.5 MG/0.5ML SOAJ      Meds ordered this encounter  Medications   Vitamin D, Ergocalciferol, (DRISDOL) 1.25 MG (50000 UNIT) CAPS capsule    Sig: Take 1 capsule (50,000 Units total) by mouth every 7 (seven) days.    Dispense:  4 capsule    Refill:  0    60 d supply;  ** OV for RF **   Do not send RF request   tirzepatide (MOUNJARO) 2.5 MG/0.5ML Pen    Sig: Inject 2.5 mg into the skin once a week.    Dispense:  2 mL    Refill:  0     Insulin resistance with polyphagia and carb cravings Assessment: A1c is stable, however fasting Insulin levels are not optimized. She is requesting a script for [employer]. Pt reports that despite following the meal plan 98% of the time, she has been having satiety issues and cravings at night.   Lab Results  Component Value Date   HGBA1C 5.2 04/23/2022   HGBA1C 5.4 12/02/2021   HGBA1C 5.2 02/25/2021   INSULIN 16.5 04/23/2022   INSULIN 8.0 12/02/2021   INSULIN 17.2 04/09/2021    Plan: Recheck labs. Begin Mounjaro. Patient denies a personal history of pancreatitis;  and denies a family history of medullary thyroid carcinoma or multiple endocrine neoplasia type II.  I informed the patient that the demand for GLP-1-like medications is high and supply is low, therefore the medication can be difficult to obtain.  Strategies to obtain discussed with patient.  Potential risks/ benefits of the medication were explained to patient. Explained that the more she eats  "off-plan", the more likely for her to have side effects of the drug. Pt endorses may having GI upset in the past with Ozempic, but now thinks that it came from something else. She understands the potential GI risk of starting Mounjaro and wishes to move forward with the medication at this time.    Vitamin D deficiency Assessment: Condition is stable. No issues with Ergocalciferol 50K IU weekly, tolerating well. No concerns.    Lab Results  Component Value Date   VD25OH 51.6 04/23/2022   VD25OH 43.8 12/02/2021   VD25OH 17.2 (L) 04/09/2021   Plan: Recheck labs. Continue with Vitamin D at current dose. Will refill today.    Continue her prudent nutritional plan that is low in simple carbohydrates, saturated fats and trans fats to goal of 5-10% weight loss to achieve significant health benefits.  Pt encouraged to continually advance exercise and cardiovascular fitness as tolerated throughout weight loss journey.    Vitamin B deficiency Assessment: Condition is high normal and diet controled. Ramonica is currently not taking any B12 supplement  Lab Results  Component Value Date   VITAMINB12 >2000 (H) 04/23/2022    Plan: Recheck labs.   Continue their prudent nutritional plan and focus on b12 rich foods such as lean red meats; poultry; eggs; seafood; beans, peas, and lentils; nuts and seeds; and soy products   Essential hypertension  Assessment: Blood pressure is 100/70 and slightly low today. Pt denies any dizziness or lightheadedness. No issues with Toprol-XL 50 mg daily, Lasix 20 mg daily, and Lozol 1.25 mg daily.  Denies any adverse effects.   Last 3 blood pressure readings in our office are as follows: BP Readings from Last 3 Encounters:  08/12/22 130/81  07/22/22 97/65  06/26/22 128/86   Plan: Unless pre-existing renal or cardiopulmonary conditions exist which patient was told to limit their fluid intake by another provider, I recommended roughly one half of their weight in  pounds, to be the approximate ounces of non-caloric, non-caffeinated beverages they should drink per day; including more if they are engaging in exercise.   I discussed with pt that if her blood pressure is < 100/65 at home to contact her cardiologist about medication management. Continue with all antihypertensive medications/diuretics as prescribed by specialist/PCP.   Avoid buying foods that are: processed, frozen, or prepackaged to avoid excess salt. Ambulatory blood pressure monitoring encouraged.  Reminded patient that if they ever feel poorly in any way, to check their blood pressure and pulse as well.We will continue to monitor closely alongside PCP/ specialists.  Pt reminded to also f/up with those individuals as instructed by them. We will continue to monitor symptoms as they relate to the her weight loss journey.    TREATMENT PLAN FOR OBESITY: BMI 36.0-36.9,adult-current bmi 35.62 Morbid obesity (HCC)-starting bmi 41.64/date 04/09/21 Assessment: Diavion Pinder is here to discuss her progress with her obesity treatment plan along with follow-up of her obesity related diagnoses. See Medical Weight Management Flowsheet for complete bioelectrical impedance results.  Condition is docourse: improving. Biometric data collected today, was reviewed with patient.   Since last office visit on 08/12/22 patient's  Muscle mass has increased by 13.4 lb. Fat mass has decreased by 15.8 lb. Total body water has increased by 1 lb.  Counseling done on how various foods will affect these numbers and how to maximize success  Total lbs lost to date: - 42  Total weight loss percentage to date: 16.28   Plan: Continue with the Category 1 meal plan   Behavioral Intervention Additional resources provided today: patient declined Evidence-based interventions for health behavior change were utilized today including the discussion of self monitoring techniques, problem-solving barriers and SMART goal setting  techniques.   Regarding patient's less desirable eating habits and patterns, we employed the technique of small changes.  Pt will specifically work on: increasing her water intake and continue with meal plan/ current exercise regiment  for next visit.    Recommended Physical Activity Goals  Donzetta has been advised to slowly work up to 150 minutes of moderate intensity aerobic activity a week and strengthening exercises 2-3 times per week for cardiovascular health, weight loss maintenance and preservation of muscle mass.   She has agreed to Continue current level of physical activity   FOLLOW UP: Return in about 4 weeks (around 10/09/2022). She was informed of the importance of frequent follow up visits to maximize her success with intensive lifestyle modifications for her multiple health conditions.  Tawsha [employer]ang[employer] is aware that we will review all of her lab results at our next visit.  She is aware that if anything is critical/ life threatening with the results, we will be contacting her via MyChart prior to the office visit to discuss management.    Subjective:   Chief complaint: Obesity Linnette is here to discuss her progress with her obesity treatment plan. She is  on the Category 1 Plan and states she is following her eating plan approximately 98% of the time. She states she is cardio and weights  60 minutes 2-3 days per week.  Interval History:  Skilynn Moist is here for a follow up office visit.  Zalina endorses not taking any GLP-1 in the past 2.5  months because she is having difficulty finding Wegovy and Zepbound. She is requesting a prescription for [facility] today. Pt endorses that despite following the meal plan closely, she has been having satiety issues and cravings in the evenings. She is drinking 48 ounces of water daily.  She reports that her clothes are fitting looser than before.   Review of Systems:  Pertinent positives were addressed with patient today.  Reviewed  by clinician on day of visit: allergies, medications, problem list, medical history, surgical history, family history, social history, and previous encounter notes.  Weight Summary and Biometrics   Weight Lost Since Last Visit: 2l  No data recorded   Vitals Temp: 97.8 F (36.6 C) Pulse Rate: 69 SpO2: 95 %   Anthropometric Measurements Height: 5\' 6"  (1.676 m) Weight: 216 lb (98 kg) BMI (Calculated): 34.88 Weight at Last Visit: 218lb Weight Lost Since Last Visit: 2l Starting Weight: 258lb Total Weight Loss (lbs): 41 lb (18.6 kg) Peak Weight: 280lb   Body Composition  Body Fat %: 41.2 % Fat Mass (lbs): 89.2 lbs Muscle Mass (lbs): 121 lbs Total Body Water (lbs): 80.8 lbs Visceral Fat Rating : 13   Other Clinical Data Fasting: yes Labs: yes Today's Visit #: 21 Starting Date: 04/09/21   Objective:   PHYSICAL EXAM: Pulse 69, temperature 97.8 F (36.6 C), height 5\' 6"  (1.676 m), weight 216 lb (98 kg), SpO2 95 %. Body mass index is 34.86 kg/m.  General: Well Developed, well nourished, and in no acute distress.  HEENT: Normocephalic, atraumatic Skin: Warm and dry, cap RF less 2 sec, good turgor Chest:  Normal excursion, shape, no gross abn Respiratory: speaking in full sentences, no conversational dyspnea NeuroM-Sk: Ambulates w/o assistance, moves * 4 Psych: A and O *3, insight good, mood-full  DIAGNOSTIC DATA REVIEWED:  BMET    Component Value Date/Time   NA 142 04/23/2022 1456   K 4.8 04/23/2022 1456   CL 103 04/23/2022 1456   CO2 20 04/23/2022 1456   GLUCOSE 80 04/23/2022 1456   GLUCOSE 82 04/03/2021 1053   BUN 24 04/23/2022 1456   CREATININE 1.22 (H) 04/23/2022 1456   CREATININE 1.17 (H) 02/22/2021 1501   CALCIUM 9.4 04/23/2022 1456   GFRNONAA 45 (L) 03/07/2021 1248   GFRAA 52 (L) 08/04/2018 0921   Lab Results  Component Value Date   HGBA1C 5.2 04/23/2022   HGBA1C 5.6 01/31/2017   Lab Results  Component Value Date   INSULIN 16.5 04/23/2022    INSULIN 17.2 04/09/2021   Lab Results  Component Value Date   TSH 1.57 08/13/2021   CBC    Component Value Date/Time   WBC 3.3 06/19/2021 0000   WBC 3.4 (L) 02/26/2021 0108   RBC 4.23 06/19/2021 0000   HGB 13.9 06/19/2021 0000   HCT 41 06/19/2021 0000   PLT 189 06/19/2021 0000   MCV 100.8 (H) 02/26/2021 0108   MCH 33.7 02/26/2021 0108   MCHC 33.4 02/26/2021 0108   RDW 15.5 02/26/2021 0108   Iron Studies    Component Value Date/Time   IRON 386 06/23/2017 0000   TIBC 285 06/23/2017 0000   FERRITIN 58 06/23/2017  0000   IRONPCTSAT 26 06/23/2017 0000   Lipid Panel     Component Value Date/Time   CHOL 134 04/23/2022 1456   TRIG 57 04/23/2022 1456   HDL 55 04/23/2022 1456   CHOLHDL 3.0 12/02/2021 0905   CHOLHDL 2.5 02/25/2021 0757   VLDL 7 02/25/2021 0757   LDLCALC 67 04/23/2022 1456   LDLCALC 68 11/14/2019 1537   LDLDIRECT 321.8 02/27/2012 1202   Hepatic Function Panel     Component Value Date/Time   PROT 7.2 04/23/2022 1456   ALBUMIN 4.2 04/23/2022 1456   AST 21 04/23/2022 1456   ALT 15 04/23/2022 1456   ALKPHOS 100 04/23/2022 1456   BILITOT 0.4 04/23/2022 1456   BILIDIR 0.1 08/08/2020 1615   BILIDIR 0.10 11/03/2016 1155   IBILI 0.2 11/14/2019 1537      Component Value Date/Time   TSH 1.57 08/13/2021 0000   TSH 1.60 04/03/2021 1053   Nutritional Lab Results  Component Value Date   VD25OH 51.6 04/23/2022   VD25OH 43.8 12/02/2021   VD25OH 17.2 (L) 04/09/2021    Attestations:   I, Special Puri, acting as a Stage manager for Thomasene Lot, DO., have compiled all relevant documentation for today's office visit on behalf of Thomasene Lot, DO, while in the presence of [employer], DO.  I have reviewed the above documentation for accuracy and completeness, and I agree with the above. [name], D.O.  The 21st Century Cures Act was signed into law in 2016 which includes the topic of electronic health records.  This provides immediate  access to information in MyChart.  This includes consultation notes, operative notes, office notes, lab results and pathology reports.  If you have any questions about what you read please let us know at your next visit so we can discuss your concerns and take corrective action if need be.  We are right here with you.

## 2022-09-12 LAB — VITAMIN D 25 HYDROXY (VIT D DEFICIENCY, FRACTURES): Vit D, 25-Hydroxy: 60.7 ng/mL (ref 30.0–100.0)

## 2022-09-12 LAB — INSULIN, RANDOM: INSULIN: 8.3 u[IU]/mL (ref 2.6–24.9)

## 2022-09-12 LAB — VITAMIN B12: Vitamin B-12: >2000 pg/mL — ABNORMAL HIGH (ref 232–1245)

## 2022-09-16 ENCOUNTER — Telehealth (INDEPENDENT_AMBULATORY_CARE_PROVIDER_SITE_OTHER): Payer: Self-pay

## 2022-09-16 NOTE — Telephone Encounter (Signed)
Prior Auth submitted for [employer].    Your demographic data has been sent to [facility] successfully!  [employer] typically takes 5-10 minutes to respond, but it may take a little longer in some cases. You will be notified by email when available. You can also check for an update later by opening this request from your dashboard. Please do not fax or call [employer] to resubmit this request. If you need assistance, please chat with CoverMyMeds or call us at [phone].  If it has been longer than 24 hours, please reach out to [employer].

## 2022-09-17 NOTE — Telephone Encounter (Signed)
Prior Auth for [name] has been denied.  Why your request was denied: Your plan only covers this drug when it is used for certain health conditions. Covered use is for type 2 diabetes. Your plan does not cover this drug for your health condition that your doctor told us you have. We reviewed the information we had. Your request has been denied. Your doctor can send [name] any new or missing information for [name] to review. For this drug, you may have to meet other criteria. You can request the drug policy for more details. You can also request other plan documents for your review.

## 2022-09-17 NOTE — Telephone Encounter (Signed)
Clinical questions submitted for [facility].    Your information has been submitted to [employer]. To check for an updated outcome later, reopen this PA request from your dashboard.  If [employer] has not responded to your request within 24 hours, contact [employer] at [phone]. If you think there may be a problem with your PA request, use our live chat feature at the bottom right.

## 2022-09-26 DIAGNOSIS — R208 Other disturbances of skin sensation: Secondary | ICD-10-CM | POA: Diagnosis not present

## 2022-09-26 DIAGNOSIS — S60451A Superficial foreign body of left index finger, initial encounter: Secondary | ICD-10-CM | POA: Diagnosis not present

## 2022-10-10 ENCOUNTER — Other Ambulatory Visit (INDEPENDENT_AMBULATORY_CARE_PROVIDER_SITE_OTHER): Payer: Self-pay | Admitting: Family Medicine

## 2022-10-10 DIAGNOSIS — E88819 Insulin resistance, unspecified: Secondary | ICD-10-CM

## 2022-10-13 ENCOUNTER — Ambulatory Visit (INDEPENDENT_AMBULATORY_CARE_PROVIDER_SITE_OTHER): Payer: BC Managed Care – PPO | Admitting: Family Medicine

## 2022-10-13 ENCOUNTER — Encounter (INDEPENDENT_AMBULATORY_CARE_PROVIDER_SITE_OTHER): Payer: Self-pay | Admitting: Family Medicine

## 2022-10-13 VITALS — BP 112/76 | HR 76 | Temp 98.0°F | Ht 66.0 in | Wt 218.0 lb

## 2022-10-13 DIAGNOSIS — E559 Vitamin D deficiency, unspecified: Secondary | ICD-10-CM

## 2022-10-13 DIAGNOSIS — K5903 Drug induced constipation: Secondary | ICD-10-CM

## 2022-10-13 DIAGNOSIS — Z6836 Body mass index (BMI) 36.0-36.9, adult: Secondary | ICD-10-CM

## 2022-10-13 DIAGNOSIS — I10 Essential (primary) hypertension: Secondary | ICD-10-CM | POA: Diagnosis not present

## 2022-10-13 DIAGNOSIS — Z6835 Body mass index (BMI) 35.0-35.9, adult: Secondary | ICD-10-CM

## 2022-10-13 DIAGNOSIS — E88819 Insulin resistance, unspecified: Secondary | ICD-10-CM

## 2022-10-13 MED ORDER — VITAMIN D (ERGOCALCIFEROL) 1.25 MG (50000 UNIT) PO CAPS: 50000.0000 [IU] | ORAL_CAPSULE | ORAL | 0 refills | Status: DC

## 2022-10-13 MED ORDER — TIRZEPATIDE 2.5 MG/0.5ML ~~LOC~~ SOAJ: 2.5000 mg | SUBCUTANEOUS | 0 refills | Status: DC

## 2022-10-13 MED ORDER — POLYETHYLENE GLYCOL 3350 17 GM/SCOOP PO POWD: ORAL | 0 refills | Status: AC

## 2022-10-13 NOTE — Progress Notes (Signed)
[name], D.O.  ABFM, ABOM Specializing in Clinical Bariatric Medicine  Office located at: [address] [place], [place]  [address]     Assessment and Plan:   Medications Discontinued During This Encounter  Medication Reason   tirzepatide [facility]) 2.5 MG/0.5ML Pen Reorder   Vitamin D, Ergocalciferol, (DRISDOL) 1.25 MG (50000 UNIT) CAPS capsule Reorder    Meds ordered this encounter  Medications   tirzepatide (MOUNJARO) 2.5 MG/0.5ML Pen    Sig: Inject 2.5 mg into the skin once a week.    Dispense:  2 mL    Refill:  0   polyethylene glycol powder (GLYCOLAX/MIRALAX) 17 GM/SCOOP powder    Sig: 17 gram po BID prn constipation    Dispense:  850 g    Refill:  0   Vitamin D, Ergocalciferol, (DRISDOL) 1.25 MG (50000 UNIT) CAPS capsule    Sig: Take 1 capsule (50,000 Units total) by mouth every 7 (seven) days.    Dispense:  4 capsule    Refill:  0    60 d supply;  ** OV for RF **   Do not send RF request     Insulin resistance with polyphagia and carb cravings Assessment: Pt condition is stable. Goal is HgbA1c < 5.7, fasting insulin closer to 5.  She continues Mounjaro with the only side effect being constipation. Pt states that she prefers to take [facility] on Mondays instead of Thursdays as it makes her tired on the weekend.  Lab Results  Component Value Date   HGBA1C 5.2 04/23/2022   Lab Results  Component Value Date   INSULIN 8.3 09/11/2022   INSULIN 16.5 04/23/2022   INSULIN 8.0 12/02/2021   INSULIN 17.2 04/09/2021   Plan:  - Continue Mounjaro 2.5mg  weekly as directed by Dr. Val Eagle. Will refill today. She denies a need for increase in dose.   - She will continue to focus on protein-rich, low simple carbohydrate foods. We reviewed the importance of hydration, regular exercise for stress reduction, and restorative sleep.   - We will continue to monitor closely alongside PCP/ specialists.    Vitamin D deficiency Assessment: Condition is At goal.. She  continues Ergocalciferol weekly and tolerates this well with no adverse side effects.  Lab Results  Component Value Date   VD25OH 60.7 09/11/2022   VD25OH 51.6 04/23/2022   VD25OH 43.8 12/02/2021   Plan: - Continue Ergocalciferol 50K IU weekly. Will refill today.   - weight loss will likely improve availability of vitamin D, thus encouraged [name] to continue with meal plan and their weight loss efforts to further improve this condition.  Thus, we will need to monitor levels regularly (every 3-4 mo on average) to keep levels within normal limits and prevent over supplementation.   Essential hypertension Assessment: Condition is At goal. This is well controlled with Toprol, Lasix, and Lozol. She tolerates these medications well and denies any adverse side effects.  Last 3 blood pressure readings in our office are as follows: BP Readings from Last 3 Encounters:  10/13/22 112/76  08/12/22 130/81  07/22/22 97/65    Plan: - BP is 112/76 at goal today.   - Continue Toprol-XL 50 mg daily, Lasix 20 mg daily, and Lozol 1.25 mg daily.   - Ambulatory blood pressure monitoring encouraged.   - We will continue to monitor closely alongside PCP/ specialists.  Pt reminded to also f/up with those individuals as instructed by them. We will continue to monitor symptoms as they relate  to the her weight loss journey.   Drug-induced constipation Assessment: Condition is Not optimized. This symptom is caused from her Mounjaro injections.   Plan: -Begin taking MiraLAX 2 times daily until she is regular then switch to once a day.   - Continue benefiber powder and increase cardio, water, and fiber intake.    TREATMENT PLAN FOR OBESITY: BMI 36.0-36.9,adult-current bmi 35.2 Morbid obesity (HCC)-starting bmi 41.64/date 04/09/21 Assessment:  [name] is here to discuss her progress with her obesity treatment plan along with follow-up of her obesity related diagnoses. See Medical Weight Management  Flowsheet for complete bioelectrical impedance results.  Condition is not optimized. Biometric data collected today, was reviewed with patient.   Since last office visit on 08/12/2022 patient's  Muscle mass has increased by 0.2lb. Fat mass has increased by 0.2lb. Total body water has increased by 2.6lb.  Counseling done on how various foods will affect these numbers and how to maximize success  Total lbs lost to date: 40 Total weight loss percentage to date: 15.50%   Plan: Continue to adhere to the Category 1 nutritional meal plan.   - Unless pre-existing renal or cardiopulmonary conditions exist which patient was told to limit their fluid intake by another provider, I recommended roughly one half of their weight in pounds, to be the approximate ounces of non-caloric, non-caffeinated beverages they should drink per day; including more if they are engaging in exercise.   Behavioral Intervention Additional resources provided today: patient declined Evidence-based interventions for health behavior change were utilized today including the discussion of self monitoring techniques, problem-solving barriers and SMART goal setting techniques.   Regarding patient's less desirable eating habits and patterns, we employed the technique of small changes.  Pt will specifically work on: increase water intake by 108oz for next visit.    Recommended Physical Activity Goals  [name] has been advised to slowly work up to 150 minutes of moderate intensity aerobic activity a week and strengthening exercises 2-3 times per week for cardiovascular health, weight loss maintenance and preservation of muscle mass.   She has agreed to Continue current level of physical activity   FOLLOW UP: Return in about 4 weeks (around 11/10/2022). She was informed of the importance of frequent follow up visits to maximize her success with intensive lifestyle modifications for her multiple health conditions.  Subjective:    Chief complaint: Obesity Amazing is here to discuss her progress with her obesity treatment plan. She is on the the Category 1 Plan and states she is following her eating plan approximately 70% of the time. She states she is doing cardio and weight lifting 60 minutes 3 days per week.  Interval History:  [name] is here for a follow up office visit.  Since last OV she has been doing well.   - Pt endorses that she drinks about 48oz daily.   - She continues to mainly eat chicken and seafood.   - Pt endorses following the meal plan to the best of her ability.     Pharmacotherapy for weight loss: She is currently taking  Mounjaro  for medical weight loss.  Denies side effects.    Review of Systems:  Pertinent positives were addressed with patient today.  Reviewed by clinician on day of visit: allergies, medications, problem list, medical history, surgical history, family history, social history, and previous encounter notes.  Weight Summary and Biometrics   Weight Lost Since Last Visit: 0lb  Weight Gained Since Last Visit: 2lb  Vitals Temp: 98 F (36.7 C) BP: 112/76 Pulse Rate: 76 SpO2: 98 %   Anthropometric Measurements Height: 5\' 6"  (1.676 m) Weight: 218 lb (98.9 kg) BMI (Calculated): 35.2 Weight at Last Visit: 216lb Weight Lost Since Last Visit: 0lb Weight Gained Since Last Visit: 2lb Starting Weight: 258lb Total Weight Loss (lbs): 39 lb (17.7 kg) Peak Weight: 280lb   Body Composition  Body Fat %: 48.1 % Fat Mass (lbs): 105.2 lbs Muscle Mass (lbs): 107.8 lbs Total Body Water (lbs): 82.4 lbs Visceral Fat Rating : 14   Other Clinical Data Fasting: no Labs: no Today's Visit #: 22 Starting Date: 04/09/21     Objective:   PHYSICAL EXAM: Blood pressure 112/76, pulse 76, temperature 98 F (36.7 C), height 5\' 6"  (1.676 m), weight 218 lb (98.9 kg), SpO2 98%. Body mass index is 35.19 kg/m.  General: Well Developed, well nourished, and in no  acute distress.  HEENT: Normocephalic, atraumatic Skin: Warm and dry, cap RF less 2 sec, good turgor Chest:  Normal excursion, shape, no gross abn Respiratory: speaking in full sentences, no conversational dyspnea NeuroM-Sk: Ambulates w/o assistance, moves * 4 Psych: A and O *3, insight good, mood-full  DIAGNOSTIC DATA REVIEWED:  BMET    Component Value Date/Time   NA 142 04/23/2022 1456   K 4.8 04/23/2022 1456   CL 103 04/23/2022 1456   CO2 20 04/23/2022 1456   GLUCOSE 80 04/23/2022 1456   GLUCOSE 82 04/03/2021 1053   BUN 24 04/23/2022 1456   CREATININE 1.22 (H) 04/23/2022 1456   CREATININE 1.17 (H) 02/22/2021 1501   CALCIUM 9.4 04/23/2022 1456   GFRNONAA 45 (L) 03/07/2021 1248   GFRAA 52 (L) 08/04/2018 0921   Lab Results  Component Value Date   HGBA1C 5.2 04/23/2022   HGBA1C 5.6 01/31/2017   Lab Results  Component Value Date   INSULIN 8.3 09/11/2022   INSULIN 17.2 04/09/2021   Lab Results  Component Value Date   TSH 1.57 08/13/2021   CBC    Component Value Date/Time   WBC 3.3 06/19/2021 0000   WBC 3.4 (L) 02/26/2021 0108   RBC 4.23 06/19/2021 0000   HGB 13.9 06/19/2021 0000   HCT 41 06/19/2021 0000   PLT 189 06/19/2021 0000   MCV 100.8 (H) 02/26/2021 0108   MCH 33.7 02/26/2021 0108   MCHC 33.4 02/26/2021 0108   RDW 15.5 02/26/2021 0108   Iron Studies    Component Value Date/Time   IRON 386 06/23/2017 0000   TIBC 285 06/23/2017 0000   FERRITIN 58 06/23/2017 0000   IRONPCTSAT 26 06/23/2017 0000   Lipid Panel     Component Value Date/Time   CHOL 134 04/23/2022 1456   TRIG 57 04/23/2022 1456   HDL 55 04/23/2022 1456   CHOLHDL 3.0 12/02/2021 0905   CHOLHDL 2.5 02/25/2021 0757   VLDL 7 02/25/2021 0757   LDLCALC 67 04/23/2022 1456   LDLCALC 68 11/14/2019 1537   LDLDIRECT 321.8 02/27/2012 1202   Hepatic Function Panel     Component Value Date/Time   PROT 7.2 04/23/2022 1456   ALBUMIN 4.2 04/23/2022 1456   AST 21 04/23/2022 1456   ALT 15  04/23/2022 1456   ALKPHOS 100 04/23/2022 1456   BILITOT 0.4 04/23/2022 1456   BILIDIR 0.1 08/08/2020 1615   BILIDIR 0.10 11/03/2016 1155   IBILI 0.2 11/14/2019 1537      Component Value Date/Time   TSH 1.57 08/13/2021 0000   TSH 1.60 04/03/2021 1053  Nutritional Lab Results  Component Value Date   VD25OH 60.7 09/11/2022   VD25OH 51.6 04/23/2022   VD25OH 43.8 12/02/2021    Attestations:   I, Clinical biochemist, acting as a Stage manager for [employer], DO., have compiled all relevant documentation for today's office visit on behalf of Thomasene Lot, DO, while in the presence of [employer], DO.  I have reviewed the above documentation for accuracy and completeness, and I agree with the above. [name], D.O.  The 21st Century Cures Act was signed into law in 2016 which includes the topic of electronic health records.  This provides immediate access to information in MyChart.  This includes consultation notes, operative notes, office notes, lab results and pathology reports.  If you have any questions about what you read please let us know at your next visit so we can discuss your concerns and take corrective action if need be.  We are right here with you.

## 2022-10-14 ENCOUNTER — Other Ambulatory Visit: Payer: Self-pay | Admitting: Internal Medicine

## 2022-10-20 ENCOUNTER — Telehealth (INDEPENDENT_AMBULATORY_CARE_PROVIDER_SITE_OTHER): Payer: Self-pay

## 2022-10-20 NOTE — Telephone Encounter (Signed)
  Request for coverage of Mounjaro (tirzepatide) has been denied.  Coverage for this medication is denied for the following reason(s). We reviewed the information we received about your condition and circumstances. We used plan approved criteria when making this decision. The policy states that this medication may be approved when:  - You have a diagnosis of type 2 diabetes mellitus

## 2022-10-20 NOTE — Telephone Encounter (Signed)
Prior Auth submitted for [employer].  Awaiting response.

## 2022-10-20 NOTE — Telephone Encounter (Signed)
Your information has been submitted to [employer]. To check for an updated outcome later, reopen this PA request from your dashboard. If [employer] has not responded to your request within 24 hours, contact [employer] at [phone]. If you think there may be a problem with your PA request, use our live chat feature at the bottom right.    

## 2022-11-01 ENCOUNTER — Other Ambulatory Visit: Payer: Self-pay | Admitting: Internal Medicine

## 2022-11-01 DIAGNOSIS — E039 Hypothyroidism, unspecified: Secondary | ICD-10-CM

## 2022-11-10 ENCOUNTER — Encounter (INDEPENDENT_AMBULATORY_CARE_PROVIDER_SITE_OTHER): Payer: Self-pay | Admitting: Family Medicine

## 2022-11-10 ENCOUNTER — Ambulatory Visit (INDEPENDENT_AMBULATORY_CARE_PROVIDER_SITE_OTHER): Payer: BC Managed Care – PPO | Admitting: Family Medicine

## 2022-11-10 VITALS — BP 115/79 | HR 66 | Temp 97.6°F | Ht 66.0 in | Wt 215.0 lb

## 2022-11-10 DIAGNOSIS — I10 Essential (primary) hypertension: Secondary | ICD-10-CM

## 2022-11-10 DIAGNOSIS — E88819 Insulin resistance, unspecified: Secondary | ICD-10-CM

## 2022-11-10 DIAGNOSIS — E559 Vitamin D deficiency, unspecified: Secondary | ICD-10-CM | POA: Diagnosis not present

## 2022-11-10 DIAGNOSIS — Z6834 Body mass index (BMI) 34.0-34.9, adult: Secondary | ICD-10-CM | POA: Diagnosis not present

## 2022-11-10 DIAGNOSIS — R632 Polyphagia: Secondary | ICD-10-CM

## 2022-11-10 DIAGNOSIS — K5903 Drug induced constipation: Secondary | ICD-10-CM

## 2022-11-10 DIAGNOSIS — Z6836 Body mass index (BMI) 36.0-36.9, adult: Secondary | ICD-10-CM

## 2022-11-10 MED ORDER — VITAMIN D (ERGOCALCIFEROL) 1.25 MG (50000 UNIT) PO CAPS: 50000.0000 [IU] | ORAL_CAPSULE | ORAL | 1 refills | Status: DC

## 2022-11-10 NOTE — Progress Notes (Signed)
[name], D.O.  ABFM, ABOM Specializing in Clinical Bariatric Medicine  Office located at: [address] [place], [place]  [address]     Assessment and Plan:   Medications Discontinued During This Encounter  Medication Reason   Vitamin D, Ergocalciferol, (DRISDOL) 1.25 MG (50000 UNIT) CAPS capsule Reorder    Meds ordered this encounter  Medications   Vitamin D, Ergocalciferol, (DRISDOL) 1.25 MG (50000 UNIT) CAPS capsule    Sig: Take 1 capsule (50,000 Units total) by mouth every 7 (seven) days.    Dispense:  4 capsule    Refill:  1    60 d supply;  ** OV for RF **   Do not send RF request    Vitamin D deficiency Assessment: Condition is Controlled.. Her vitamin D levels are within an optimal range. She continue with ERGO weekly and denies any adverse side effects.  Lab Results  Component Value Date   VD25OH 60.7 09/11/2022   VD25OH 51.6 04/23/2022   VD25OH 43.8 12/02/2021   Plan:  - Continue Ergocalciferol once weekly and she was encouraged to continue to take the medicine until told otherwise. Will refill today.   - Will monitor levels regularly every 3-4 mo on average to keep levels within normal limits.   Insulin resistance with polyphagia and carb cravings Assessment: Condition is Not optimized. Pt has discontinue Mounjaro on her own since last OV on 10/13/2022. She endorses having no hunger and cravings. She denies any GI upset, N/V/D, or adverse side effects.  Lab Results  Component Value Date   HGBA1C 5.2 04/23/2022   HGBA1C 5.4 12/02/2021   HGBA1C 5.2 02/25/2021   INSULIN 8.3 09/11/2022   INSULIN 16.5 04/23/2022   INSULIN 8.0 12/02/2021    Plan: - Continue Mounjaro once weekly. She denies need for refill at this time.   - Continue her prudent nutritional plan that is low in simple carbohydrates, saturated fats and trans fats to goal of 5-10% weight loss to achieve significant health benefits.  Pt encouraged to continually advance exercise and  cardiovascular fitness as tolerated throughout weight loss journey.  - Anticipatory guidance given.    - We will recheck A1c and fasting insulin level in approximately 3 months from last check or as deemed appropriate.    Essential hypertension Assessment: Condition is Controlled.. This is being treated with Toprol, Lasix, and Lozol. She monitors her BP at home and her lowest BP was 110's/70's.  She has an follow-up appointment with her cardiologist later and will discuss her BP medications.  Last 3 blood pressure readings in our office are as follows: BP Readings from Last 3 Encounters:  11/10/22 115/79  10/13/22 112/76  08/12/22 130/81    Plan: - Continue Toprol, Lasix, and Lozol at current dose. She denies need for refill at this time.   [name] BP is 115/79 at goal today.   - Lifestyle changes such as following our low salt and heart healthy meal plan.  - We will continue to monitor symptoms closely as they relate to the her weight loss journey.   - Encouraged to follow-up with PCP/ specialists regarding her health.    Drug-induced constipation Assessment: Condition is Controlled.. Her constipation has resolved. She continues to take MiraLAX 1tbsp and 1tbsp of benefiber twice daily.   Plan: - Continue MiraLAX and benefiber at current adjusted dose.    TREATMENT PLAN FOR OBESITY: BMI 36.0-36.9,adult-current bmi 35.2 Morbid obesity (HCC)-starting bmi 41.64/date 04/09/21 Assessment:  [name] is here to discuss her progress with her obesity treatment plan along with follow-up of her obesity related diagnoses. See Medical Weight Management Flowsheet for complete bioelectrical impedance results.  Condition is docourse: improving. Biometric data collected today, was reviewed with patient.   Since last office visit on 10/13/2022 patient's  Muscle mass has increased by 1.2lb. Fat mass has decreased by 5lb. Total body water has decreased by 4.6lb.  Counseling  done on how various foods will affect these numbers and how to maximize success  Total lbs lost to date: 43lbs Total weight loss percentage to date: 16.67%    Plan: - Continue with the Category 1 meal plan consistently.     Behavioral Intervention Additional resources provided today: patient declined Evidence-based interventions for health behavior change were utilized today including the discussion of self monitoring techniques, problem-solving barriers and SMART goal setting techniques.   Regarding patient's less desirable eating habits and patterns, we employed the technique of small changes.  Pt will specifically work on: Continue to follow the prescribed meal plan for next visit.     She has agreed to Continue current level of physical activity    FOLLOW UP: Return in about 4 weeks (around 12/08/2022).  She was informed of the importance of frequent follow up visits to maximize her success with intensive lifestyle modifications for her multiple health conditions.  Subjective:   Chief complaint: Obesity [name] is here to discuss her progress with her obesity treatment plan. She is on the the Category 1 Plan and states she is following her eating plan approximately 95% of the time. She states she is doing cardio and walking 60 minutes 3 days per week.  Interval History:  [name] is here for a follow up office visit.     Since last OV she has been doing good. She notes increasing her physical activity and has added a extra day of going to the gym. She does continue to weigh herself but does not do this as often as before. She has increased her water intake appropriately.   Pharmacotherapy for weight loss: She is currently taking  Mounjaro  for medical weight loss.  Denies side effects.    Review of Systems:  Pertinent positives were addressed with patient today.  Reviewed by clinician on day of visit: allergies, medications, problem list, medical history, surgical  history, family history, social history, and previous encounter notes.  Weight Summary and Biometrics   Weight Lost Since Last Visit: 3lb  Weight Gained Since Last Visit: 0lb   Vitals Temp: 97.6 F (36.4 C) BP: 115/79 Pulse Rate: 66 SpO2: 94 %   Anthropometric Measurements Height: 5\' 6"  (1.676 m) Weight: 215 lb (97.5 kg) BMI (Calculated): 34.72 Weight at Last Visit: 218lb Weight Lost Since Last Visit: 3lb Weight Gained Since Last Visit: 0lb Starting Weight: 258lb Total Weight Loss (lbs): 43 lb (19.5 kg) Peak Weight: 280lb   Body Composition  Body Fat %: 46.6 % Fat Mass (lbs): 100.2 lbs Muscle Mass (lbs): 109 lbs Total Body Water (lbs): 77.8 lbs Visceral Fat Rating : 14   Other Clinical Data Fasting: no Labs: no Today's Visit #: 23 Starting Date: 04/09/21     Objective:   PHYSICAL EXAM: Blood pressure 115/79, pulse 66, temperature 97.6 F (36.4 C), height 5\' 6"  (1.676 m), weight 215 lb (97.5 kg), SpO2 94%. Body mass index is 34.7 kg/m.  General: Well Developed, well nourished, and in no acute distress.  HEENT: Normocephalic, atraumatic Skin:  Warm and dry, cap RF less 2 sec, good turgor Chest:  Normal excursion, shape, no gross abn Respiratory: speaking in full sentences, no conversational dyspnea NeuroM-Sk: Ambulates w/o assistance, moves * 4 Psych: A and O *3, insight good, mood-full  DIAGNOSTIC DATA REVIEWED:  BMET    Component Value Date/Time   NA 142 04/23/2022 1456   K 4.8 04/23/2022 1456   CL 103 04/23/2022 1456   CO2 20 04/23/2022 1456   GLUCOSE 80 04/23/2022 1456   GLUCOSE 82 04/03/2021 1053   BUN 24 04/23/2022 1456   CREATININE 1.22 (H) 04/23/2022 1456   CREATININE 1.17 (H) 02/22/2021 1501   CALCIUM 9.4 04/23/2022 1456   GFRNONAA 45 (L) 03/07/2021 1248   GFRAA 52 (L) 08/04/2018 0921   Lab Results  Component Value Date   HGBA1C 5.2 04/23/2022   HGBA1C 5.6 01/31/2017   Lab Results  Component Value Date   INSULIN 8.3  09/11/2022   INSULIN 17.2 04/09/2021   Lab Results  Component Value Date   TSH 1.57 08/13/2021   CBC    Component Value Date/Time   WBC 3.3 06/19/2021 0000   WBC 3.4 (L) 02/26/2021 0108   RBC 4.23 06/19/2021 0000   HGB 13.9 06/19/2021 0000   HCT 41 06/19/2021 0000   PLT 189 06/19/2021 0000   MCV 100.8 (H) 02/26/2021 0108   MCH 33.7 02/26/2021 0108   MCHC 33.4 02/26/2021 0108   RDW 15.5 02/26/2021 0108   Iron Studies    Component Value Date/Time   IRON 386 06/23/2017 0000   TIBC 285 06/23/2017 0000   FERRITIN 58 06/23/2017 0000   IRONPCTSAT 26 06/23/2017 0000   Lipid Panel     Component Value Date/Time   CHOL 134 04/23/2022 1456   TRIG 57 04/23/2022 1456   HDL 55 04/23/2022 1456   CHOLHDL 3.0 12/02/2021 0905   CHOLHDL 2.5 02/25/2021 0757   VLDL 7 02/25/2021 0757   LDLCALC 67 04/23/2022 1456   LDLCALC 68 11/14/2019 1537   LDLDIRECT 321.8 02/27/2012 1202   Hepatic Function Panel     Component Value Date/Time   PROT 7.2 04/23/2022 1456   ALBUMIN 4.2 04/23/2022 1456   AST 21 04/23/2022 1456   ALT 15 04/23/2022 1456   ALKPHOS 100 04/23/2022 1456   BILITOT 0.4 04/23/2022 1456   BILIDIR 0.1 08/08/2020 1615   BILIDIR 0.10 11/03/2016 1155   IBILI 0.2 11/14/2019 1537      Component Value Date/Time   TSH 1.57 08/13/2021 0000   TSH 1.60 04/03/2021 1053   Nutritional Lab Results  Component Value Date   VD25OH 60.7 09/11/2022   VD25OH 51.6 04/23/2022   VD25OH 43.8 12/02/2021    Attestations:   I, Clinical biochemist, acting as a Stage manager for [employer], DO., have compiled all relevant documentation for today's office visit on behalf of Thomasene Lot, DO, while in the presence of [employer], DO.  I have reviewed the above documentation for accuracy and completeness, and I agree with the above. [name], D.O.  The 21st Century Cures Act was signed into law in 2016 which includes the topic of electronic health records.  This provides  immediate access to information in MyChart.  This includes consultation notes, operative notes, office notes, lab results and pathology reports.  If you have any questions about what you read please let us know at your next visit so we can discuss your concerns and take corrective action if need be.  We are right  here with you.

## 2022-11-30 ENCOUNTER — Other Ambulatory Visit: Payer: Self-pay | Admitting: Internal Medicine

## 2022-12-01 ENCOUNTER — Other Ambulatory Visit: Payer: Self-pay

## 2022-12-11 DIAGNOSIS — K219 Gastro-esophageal reflux disease without esophagitis: Secondary | ICD-10-CM | POA: Diagnosis not present

## 2022-12-11 DIAGNOSIS — K449 Diaphragmatic hernia without obstruction or gangrene: Secondary | ICD-10-CM | POA: Diagnosis not present

## 2022-12-11 DIAGNOSIS — K5904 Chronic idiopathic constipation: Secondary | ICD-10-CM | POA: Diagnosis not present

## 2022-12-15 ENCOUNTER — Ambulatory Visit (INDEPENDENT_AMBULATORY_CARE_PROVIDER_SITE_OTHER): Payer: BC Managed Care – PPO | Admitting: Family Medicine

## 2023-01-12 ENCOUNTER — Encounter (INDEPENDENT_AMBULATORY_CARE_PROVIDER_SITE_OTHER): Payer: Self-pay | Admitting: Family Medicine

## 2023-01-12 ENCOUNTER — Ambulatory Visit (INDEPENDENT_AMBULATORY_CARE_PROVIDER_SITE_OTHER): Payer: BC Managed Care – PPO | Admitting: Family Medicine

## 2023-01-12 VITALS — BP 100/66 | HR 55 | Temp 97.9°F | Ht 66.0 in | Wt 219.0 lb

## 2023-01-12 DIAGNOSIS — K5903 Drug induced constipation: Secondary | ICD-10-CM | POA: Diagnosis not present

## 2023-01-12 DIAGNOSIS — E559 Vitamin D deficiency, unspecified: Secondary | ICD-10-CM

## 2023-01-12 DIAGNOSIS — Z6836 Body mass index (BMI) 36.0-36.9, adult: Secondary | ICD-10-CM

## 2023-01-12 DIAGNOSIS — E88819 Insulin resistance, unspecified: Secondary | ICD-10-CM

## 2023-01-12 DIAGNOSIS — Z6835 Body mass index (BMI) 35.0-35.9, adult: Secondary | ICD-10-CM | POA: Diagnosis not present

## 2023-01-12 MED ORDER — VITAMIN D (ERGOCALCIFEROL) 1.25 MG (50000 UNIT) PO CAPS: 50000.0000 [IU] | ORAL_CAPSULE | ORAL | 1 refills | Status: DC

## 2023-01-12 MED ORDER — TIRZEPATIDE 5 MG/0.5ML ~~LOC~~ SOAJ: 5.0000 mg | SUBCUTANEOUS | 0 refills | Status: DC

## 2023-01-12 NOTE — Progress Notes (Signed)
[name], D.O.  ABFM, ABOM Specializing in Clinical Bariatric Medicine  Office located at: [address] [place], [place]  [address]     Assessment and Plan:   Medications Discontinued During This Encounter  Medication Reason   oxyCODONE (OXY IR/ROXICODONE) 5 MG immediate release tablet Patient Preference   doxycycline (VIBRA-TABS) 100 MG tablet Patient Preference   tirzepatide (MOUNJARO) 2.5 MG/0.5ML Pen    Vitamin D, Ergocalciferol, (DRISDOL) 1.25 MG (50000 UNIT) CAPS capsule Reorder    Meds ordered this encounter  Medications   tirzepatide (MOUNJARO) 5 MG/0.5ML Pen    Sig: Inject 5 mg into the skin once a week.    Dispense:  2 mL    Refill:  0   Vitamin D, Ergocalciferol, (DRISDOL) 1.25 MG (50000 UNIT) CAPS capsule    Sig: Take 1 capsule (50,000 Units total) by mouth every 7 (seven) days.    Dispense:  4 capsule    Refill:  1    60 d supply;  ** OV for RF **   Do not send RF request    Insulin resistance with polyphagia and carb cravings Assessment: Condition is Not optimized.Marland Kitchen She was taking Mounjaro once weekly but has been off of it for the last 3 weeks. She endorses her hunger and cravings are coming back. She tolerates this well and denies any N/V/D. She inquired about switching to Ozempic.  Lab Results  Component Value Date   HGBA1C 5.2 04/23/2022   HGBA1C 5.4 12/02/2021   HGBA1C 5.2 02/25/2021   INSULIN 8.3 09/11/2022   INSULIN 16.5 04/23/2022   INSULIN 8.0 12/02/2021    Plan:  - I will increase her Mounjaro from 2.5 to 5mg  once weekly.   - Continue to decrease simple carbs/ sugars; increase fiber and proteins -> follow her meal plan.  I explained role of simple carbs and insulin levels on hunger and cravings  - Anticipatory guidance given.    - [name] will continue to work on weight loss, exercise, via their meal plan especially when increasing her Mounjaro dosage.   - We will recheck A1c and fasting insulin level in approximately 3 months  from last check, or as deemed appropriate.    Vitamin D deficiency Assessment: Condition is Controlled.. Patient reports good compliance and tolerance of ERGO. She denies any adverse side effects on this medication at this time.  Lab Results  Component Value Date   VD25OH 60.7 09/11/2022   VD25OH 51.6 04/23/2022   VD25OH 43.8 12/02/2021   Plan: - Continue with ERGO once weekly at current dose. I will refill today.     - weight loss will likely improve availability of vitamin D, thus encouraged [name] to continue with meal plan and their weight loss efforts to further improve this condition.  With Winter approaching her vitamin D levels are prone to dropping so we will need to monitor levels regularly (every 3-4 mo on average) to keep levels within normal limits and prevent over supplementation.   Drug-induced constipation Assessment: She takes MiraLAX once a day and informed me that she was prescribed tablets that she feels is too strong.   Plan: - I informed her to contact her PCP and see if she can get a lower dose of Linzess.   - Take miraLAX twice daily when not taking Lizness.   - Increase water and fiber intake.    TREATMENT PLAN FOR OBESITY: BMI 36.0-36.9,adult-current bmi 35.2 Morbid obesity (HCC)-starting bmi 41.64/date 04/09/21 Assessment:  [name]  [name] is here to discuss her progress with her obesity treatment plan along with follow-up of her obesity related diagnoses. See Medical Weight Management Flowsheet for complete bioelectrical impedance results.  Condition is not optimized. Biometric data collected today, was reviewed with patient.   Since last office visit on 08/19//2024 patient's  Muscle mass has decreased by 0.6lb. Fat mass has increased by 5.2lb. Total body water has increased by 5.6lb.  Counseling done on how various foods will affect these numbers and how to maximize success  Total lbs lost to date: 39 Total weight loss percentage to date:  15.12%   Plan: - [name] will work on healthier eating habits and try to follow the Category 1 meal plan.   Behavioral Intervention Additional resources provided today: patient declined Evidence-based interventions for health behavior change were utilized today including the discussion of self monitoring techniques, problem-solving barriers and SMART goal setting techniques.   Regarding patient's less desirable eating habits and patterns, we employed the technique of small changes.  Pt will specifically work on: try to journal for next visit.    She has agreed to Continue current level of physical activity   FOLLOW UP: Return in about 3 weeks (around 02/02/2023).  She was informed of the importance of frequent follow up visits to maximize her success with intensive lifestyle modifications for her multiple health conditions.  Subjective:   Chief complaint: Obesity [name] is here to discuss her progress with her obesity treatment plan. She is on the the Category 1 Plan and states she is following her eating plan approximately 90% of the time. She states she is doing cardio and lifting weights 60 minutes 3 days per week.  Interval History:  [name] is here for a follow up office visit.     Since last office visit: She was last seen on 11/10/2022.  She states that she hasn't been eating on plan completely. She endorses not eating enough protein as she should. She has been eating salty and some semi high fat foods.   We reviewed her meal plan and all questions were answered.   Pharmacotherapy for weight loss: She is currently taking  Mounjaro  for medical weight loss.  Denies side effects.    Review of Systems:  Pertinent positives were addressed with patient today.  Reviewed by clinician on day of visit: allergies, medications, problem list, medical history, surgical history, family history, social history, and previous encounter notes.  Weight Summary and Biometrics    Weight Lost Since Last Visit: 0  Weight Gained Since Last Visit: 4   Vitals Temp: 97.9 F (36.6 C) BP: 100/66 Pulse Rate: (!) 55 SpO2: 99 %   Anthropometric Measurements Height: 5\' 6"  (1.676 m) Weight: 219 lb (99.3 kg) BMI (Calculated): 35.36 Weight at Last Visit: 215 lb Weight Lost Since Last Visit: 0 Weight Gained Since Last Visit: 4 Starting Weight: 258 lb Total Weight Loss (lbs): 39 lb (17.7 kg)   Body Composition  Body Fat %: 48 % Fat Mass (lbs): 105.4 lbs Muscle Mass (lbs): 108.4 lbs Total Body Water (lbs): 83.4 lbs Visceral Fat Rating : 14   Other Clinical Data Today's Visit #: 24 Starting Date: 04/09/21     Objective:   PHYSICAL EXAM: Blood pressure 100/66, pulse (!) 55, temperature 97.9 F (36.6 C), height 5\' 6"  (1.676 m), weight 219 lb (99.3 kg), SpO2 99%. Body mass index is 35.35 kg/m.  General: Well Developed, well nourished, and in no acute distress.  HEENT:  Normocephalic, atraumatic Skin: Warm and dry, cap RF less 2 sec, good turgor Chest:  Normal excursion, shape, no gross abn Respiratory: speaking in full sentences, no conversational dyspnea NeuroM-Sk: Ambulates w/o assistance, moves * 4 Psych: A and O *3, insight good, mood-full  DIAGNOSTIC DATA REVIEWED:  BMET    Component Value Date/Time   NA 142 04/23/2022 1456   K 4.8 04/23/2022 1456   CL 103 04/23/2022 1456   CO2 20 04/23/2022 1456   GLUCOSE 80 04/23/2022 1456   GLUCOSE 82 04/03/2021 1053   BUN 24 04/23/2022 1456   CREATININE 1.22 (H) 04/23/2022 1456   CREATININE 1.17 (H) 02/22/2021 1501   CALCIUM 9.4 04/23/2022 1456   GFRNONAA 45 (L) 03/07/2021 1248   GFRAA 52 (L) 08/04/2018 0921   Lab Results  Component Value Date   HGBA1C 5.2 04/23/2022   HGBA1C 5.6 01/31/2017   Lab Results  Component Value Date   INSULIN 8.3 09/11/2022   INSULIN 17.2 04/09/2021   Lab Results  Component Value Date   TSH 1.57 08/13/2021   CBC    Component Value Date/Time   WBC 3.3  06/19/2021 0000   WBC 3.4 (L) 02/26/2021 0108   RBC 4.23 06/19/2021 0000   HGB 13.9 06/19/2021 0000   HCT 41 06/19/2021 0000   PLT 189 06/19/2021 0000   MCV 100.8 (H) 02/26/2021 0108   MCH 33.7 02/26/2021 0108   MCHC 33.4 02/26/2021 0108   RDW 15.5 02/26/2021 0108   Iron Studies    Component Value Date/Time   IRON 386 06/23/2017 0000   TIBC 285 06/23/2017 0000   FERRITIN 58 06/23/2017 0000   IRONPCTSAT 26 06/23/2017 0000   Lipid Panel     Component Value Date/Time   CHOL 134 04/23/2022 1456   TRIG 57 04/23/2022 1456   HDL 55 04/23/2022 1456   CHOLHDL 3.0 12/02/2021 0905   CHOLHDL 2.5 02/25/2021 0757   VLDL 7 02/25/2021 0757   LDLCALC 67 04/23/2022 1456   LDLCALC 68 11/14/2019 1537   LDLDIRECT 321.8 02/27/2012 1202   Hepatic Function Panel     Component Value Date/Time   PROT 7.2 04/23/2022 1456   ALBUMIN 4.2 04/23/2022 1456   AST 21 04/23/2022 1456   ALT 15 04/23/2022 1456   ALKPHOS 100 04/23/2022 1456   BILITOT 0.4 04/23/2022 1456   BILIDIR 0.1 08/08/2020 1615   BILIDIR 0.10 11/03/2016 1155   IBILI 0.2 11/14/2019 1537      Component Value Date/Time   TSH 1.57 08/13/2021 0000   TSH 1.60 04/03/2021 1053   Nutritional Lab Results  Component Value Date   VD25OH 60.7 09/11/2022   VD25OH 51.6 04/23/2022   VD25OH 43.8 12/02/2021    Attestations:   I, Clinical biochemist, acting as a Stage manager for [employer], DO., have compiled all relevant documentation for today's office visit on behalf of Meghan Lot, DO, while in the presence of [employer], DO.  I have reviewed the above documentation for accuracy and completeness, and I agree with the above. [name], D.O.  The 21st Century Cures Act was signed into law in 2016 which includes the topic of electronic health records.  This provides immediate access to information in MyChart.  This includes consultation notes, operative notes, office notes, lab results and pathology reports.  If you  have any questions about what you read please let us know at your next visit so we can discuss your concerns and take corrective action if need be.  We are right here with you.

## 2023-01-13 ENCOUNTER — Telehealth (INDEPENDENT_AMBULATORY_CARE_PROVIDER_SITE_OTHER): Payer: Self-pay

## 2023-01-13 NOTE — Telephone Encounter (Signed)
Prior auth started via cover my meds for [facility].  Awaiting questions.

## 2023-01-19 NOTE — Telephone Encounter (Signed)
PA for [name] has been denied.    Date: 01/13/2023 [facility] [address] [place], [place] [address] An initial coverage request was denied; talk to your doctor about the best option for you. Dear [name]: Request for coverage of Mounjaro (tirzepatide) has been denied. A request for prescription coverage for Mounjaro (tirzepatide) was recently submitted on your behalf. After careful consideration and review of the information sent to [name], this request was not approved. We understand that this decision may not be what you and your doctor expected. This letter and the enclosed information will explain your options and help you decide what to do next. We reviewed all the supporting information sent to [name] and used your plan's guidelines to make our decision. Why your request was denied: Current plan approved criteria allows coverage of Mounjaro if the following requirement is met: -The patient have a diagnosis of type 2 diabetes mellitus Your use of this drug is either unknown or does not meet the requirement. We've let your doctor know about this denial, so they will be ready to work with you on your next steps or an alternative treatment plan. [facility] Enclosures cc: Dr. Thomasene Lot To learn more about the Prescription Coverage Review Process Scan the QR code or visit [email or web address] KEEP READING FOR STEPS YOU AND YOUR DOCTOR CAN TAKE   Pt notified via MyChart.

## 2023-01-30 ENCOUNTER — Other Ambulatory Visit: Payer: Self-pay

## 2023-01-30 ENCOUNTER — Other Ambulatory Visit: Payer: Self-pay | Admitting: Internal Medicine

## 2023-01-30 ENCOUNTER — Telehealth: Payer: Self-pay | Admitting: Internal Medicine

## 2023-01-30 DIAGNOSIS — E039 Hypothyroidism, unspecified: Secondary | ICD-10-CM

## 2023-01-30 NOTE — Telephone Encounter (Signed)
 Medication refill sent to Dr. Yetta Barre

## 2023-01-30 NOTE — Telephone Encounter (Signed)
Patient has scheduled an appointment for Dr. Yetta Barre' soonest opening on 03/02/2023. She said she has 2 days worth of medication left and would like to know if she can get enough to last until her appointment.   Prescription Request  01/30/2023  LOV: 01/24/2022  What is the name of the medication or equipment?  levothyroxine (SYNTHROID) 75 MCG tablet  Have you contacted your pharmacy to request a refill? Yes   Which pharmacy would you like this sent to?  [id number] [place], [place] - [address] [place] [address] Phone: [phone] Fax: [phone]    Patient notified that their request is being sent to the clinical staff for review and that they should receive a response within 2 business days.   Please advise at Mobile [phone] (mobile)

## 2023-02-02 ENCOUNTER — Encounter (INDEPENDENT_AMBULATORY_CARE_PROVIDER_SITE_OTHER): Payer: Self-pay | Admitting: Family Medicine

## 2023-02-02 ENCOUNTER — Ambulatory Visit (INDEPENDENT_AMBULATORY_CARE_PROVIDER_SITE_OTHER): Payer: BC Managed Care – PPO | Admitting: Family Medicine

## 2023-02-02 VITALS — BP 108/71 | HR 62 | Temp 97.8°F | Ht 66.0 in | Wt 218.0 lb

## 2023-02-02 DIAGNOSIS — E88819 Insulin resistance, unspecified: Secondary | ICD-10-CM

## 2023-02-02 DIAGNOSIS — Z6835 Body mass index (BMI) 35.0-35.9, adult: Secondary | ICD-10-CM | POA: Diagnosis not present

## 2023-02-02 DIAGNOSIS — Z6836 Body mass index (BMI) 36.0-36.9, adult: Secondary | ICD-10-CM

## 2023-02-02 DIAGNOSIS — E559 Vitamin D deficiency, unspecified: Secondary | ICD-10-CM

## 2023-02-02 MED ORDER — TIRZEPATIDE 5 MG/0.5ML ~~LOC~~ SOAJ: 5.0000 mg | SUBCUTANEOUS | 0 refills | Status: DC

## 2023-02-02 MED ORDER — VITAMIN D (ERGOCALCIFEROL) 1.25 MG (50000 UNIT) PO CAPS: 50000.0000 [IU] | ORAL_CAPSULE | ORAL | 1 refills | Status: DC

## 2023-02-02 NOTE — Progress Notes (Signed)
[name], D.O.  ABFM, ABOM Specializing in Clinical Bariatric Medicine  Office located at: [address] [place], [place]  [address]     Assessment and Plan:   Medications Discontinued During This Encounter  Medication Reason   tirzepatide [facility]) 5 MG/0.5ML Pen Reorder   Vitamin D, Ergocalciferol, (DRISDOL) 1.25 MG (50000 UNIT) CAPS capsule Reorder     Meds ordered this encounter  Medications   tirzepatide (MOUNJARO) 5 MG/0.5ML Pen    Sig: Inject 5 mg into the skin once a week.    Dispense:  2 mL    Refill:  0   Vitamin D, Ergocalciferol, (DRISDOL) 1.25 MG (50000 UNIT) CAPS capsule    Sig: Take 1 capsule (50,000 Units total) by mouth every 7 (seven) days.    Dispense:  4 capsule    Refill:  1    60 d supply;  ** OV for RF **   Do not send RF request    Insulin resistance with polyphagia and carb cravings Assessment: Condition is Not optimized.. Since the increase in [facility] she has tolerated this well and feels as this helps control her hunger and cravings more. She denies any N/V/D on this medications.  Lab Results  Component Value Date   HGBA1C 5.2 04/23/2022   HGBA1C 5.4 12/02/2021   HGBA1C 5.2 02/25/2021   INSULIN 8.3 09/11/2022   INSULIN 16.5 04/23/2022   INSULIN 8.0 12/02/2021    Plan: -  Continue Mounjaro 5mg  once weekly. I will refill.   - She will continue to focus on protein-rich, low simple carbohydrate foods. We reviewed the importance of hydration, regular exercise for stress reduction, and restorative sleep.   - Anticipatory guidance given.    - We will recheck A1c and fasting insulin level from last check, or as deemed appropriate.    Vitamin D deficiency Assessment: Condition is Controlled.. This is being well controlled with Ergocalciferol 50K IU and pt tolerates this well without difficulty. She denies any adverse side effects especially regarding her bone health.  Lab Results  Component Value Date   VD25OH 60.7 09/11/2022    VD25OH 51.6 04/23/2022   VD25OH 43.8 12/02/2021   Plan: - Continue with Ergocalciferol 50K IU weekly and she was encouraged to continue to take the medicine until told otherwise. I will refill.   - ideal vitamin D levels reviewed with patient    - We will continue to monitor levels regularly to keep levels within normal limits and prevent over supplementation.   TREATMENT PLAN FOR OBESITY: BMI 36.0-36.9,adult-current bmi 35.2 Morbid obesity (HCC)-starting bmi 41.64/date 04/09/21 Assessment:  [name] is here to discuss her progress with her obesity treatment plan along with follow-up of her obesity related diagnoses. See Medical Weight Management Flowsheet for complete bioelectrical impedance results.  Condition is not optimized. Biometric data collected today, was reviewed with patient.   Since last office visit on 01/12/2023 patient's  Muscle mass has decreased by 1.6lb. Fat mass has increased by 0.2lb. Total body water has decreased by 3.2lb.  Counseling done on how various foods will affect these numbers and how to maximize success  Total lbs lost to date: 40 Total weight loss percentage to date: 15.50%  Plan: - [name] will continue to follow the Category 1 nutritional plan while staying within the recommended goals of [phone] calories and 75+g of protein per day.   Behavioral Intervention Additional resources provided today: food log Evidence-based interventions for health behavior change were utilized today including  the discussion of self monitoring techniques, problem-solving barriers and SMART goal setting techniques.   Regarding patient's less desirable eating habits and patterns, we employed the technique of small changes.  Pt will specifically work on: journal and bring in log for next visit.     She has agreed to Continue current level of physical activity  and continue to gradually increase the amount and intensity of exercise    FOLLOW UP: Return in about  4 weeks (around 03/02/2023).  She was informed of the importance of frequent follow up visits to maximize her success with intensive lifestyle modifications for her multiple health conditions.  Subjective:   Chief complaint: Obesity [name] is here to discuss her progress with her obesity treatment plan. She is on the the Category 1 Plan and states she is following her eating plan approximately 95% of the time. She states she is cardio and weight lifting 60 minutes 3 days per week.  Interval History:  [name] is here for a follow up office visit.     Since last office visit:  Since last OV she has been well. She feels as her face looks slimmer. Pt notes consuming a Adams of chicken and following the prescribed meal plan. She informed me that she works out with a physical training and would not want to lift weights too much in fear of getting too muscular.   We reviewed her meal plan and all questions were answered.   Pharmacotherapy for weight loss: She is currently taking  Mounjaro  for medical weight loss.  Denies side effects.    Review of Systems:  Pertinent positives were addressed with patient today.  Reviewed by clinician on day of visit: allergies, medications, problem list, medical history, surgical history, family history, social history, and previous encounter notes.  Weight Summary and Biometrics   Weight Lost Since Last Visit: 1lb  Weight Gained Since Last Visit: 0lb   Vitals Temp: 97.8 F (36.6 C) BP: 108/71 Pulse Rate: 62 SpO2: 98 %   Anthropometric Measurements Height: 5\' 6"  (1.676 m) Weight: 218 lb (98.9 kg) BMI (Calculated): 35.2 Weight at Last Visit: 219lb Weight Lost Since Last Visit: 1lb Weight Gained Since Last Visit: 0lb Starting Weight: 258lb Total Weight Loss (lbs): 40 lb (18.1 kg)   Body Composition  Body Fat %: 48.4 % Fat Mass (lbs): 105.6 lbs Muscle Mass (lbs): 106.8 lbs Total Body Water (lbs): 80.2 lbs Visceral Fat Rating :  15   Other Clinical Data Fasting: No Labs: No Today's Visit #: 25 Starting Date: 04/09/21     Objective:   PHYSICAL EXAM: Blood pressure 108/71, pulse 62, temperature 97.8 F (36.6 C), height 5\' 6"  (1.676 m), weight 218 lb (98.9 kg), SpO2 98%. Body mass index is 35.19 kg/m.  General: Well Developed, well nourished, and in no acute distress.  HEENT: Normocephalic, atraumatic Skin: Warm and dry, cap RF less 2 sec, good turgor Chest:  Normal excursion, shape, no gross abn Respiratory: speaking in full sentences, no conversational dyspnea NeuroM-Sk: Ambulates w/o assistance, moves * 4 Psych: A and O *3, insight good, mood-full  DIAGNOSTIC DATA REVIEWED:  BMET    Component Value Date/Time   NA 142 04/23/2022 1456   K 4.8 04/23/2022 1456   CL 103 04/23/2022 1456   CO2 20 04/23/2022 1456   GLUCOSE 80 04/23/2022 1456   GLUCOSE 82 04/03/2021 1053   BUN 24 04/23/2022 1456   CREATININE 1.22 (H) 04/23/2022 1456   CREATININE 1.17 (H) 02/22/2021  1501   CALCIUM 9.4 04/23/2022 1456   GFRNONAA 45 (L) 03/07/2021 1248   GFRAA 52 (L) 08/04/2018 0921   Lab Results  Component Value Date   HGBA1C 5.2 04/23/2022   HGBA1C 5.6 01/31/2017   Lab Results  Component Value Date   INSULIN 8.3 09/11/2022   INSULIN 17.2 04/09/2021   Lab Results  Component Value Date   TSH 1.57 08/13/2021   CBC    Component Value Date/Time   WBC 3.3 06/19/2021 0000   WBC 3.4 (L) 02/26/2021 0108   RBC 4.23 06/19/2021 0000   HGB 13.9 06/19/2021 0000   HCT 41 06/19/2021 0000   PLT 189 06/19/2021 0000   MCV 100.8 (H) 02/26/2021 0108   MCH 33.7 02/26/2021 0108   MCHC 33.4 02/26/2021 0108   RDW 15.5 02/26/2021 0108   Iron Studies    Component Value Date/Time   IRON 386 06/23/2017 0000   TIBC 285 06/23/2017 0000   FERRITIN 58 06/23/2017 0000   IRONPCTSAT 26 06/23/2017 0000   Lipid Panel     Component Value Date/Time   CHOL 134 04/23/2022 1456   TRIG 57 04/23/2022 1456   HDL 55 04/23/2022  1456   CHOLHDL 3.0 12/02/2021 0905   CHOLHDL 2.5 02/25/2021 0757   VLDL 7 02/25/2021 0757   LDLCALC 67 04/23/2022 1456   LDLCALC 68 11/14/2019 1537   LDLDIRECT 321.8 02/27/2012 1202   Hepatic Function Panel     Component Value Date/Time   PROT 7.2 04/23/2022 1456   ALBUMIN 4.2 04/23/2022 1456   AST 21 04/23/2022 1456   ALT 15 04/23/2022 1456   ALKPHOS 100 04/23/2022 1456   BILITOT 0.4 04/23/2022 1456   BILIDIR 0.1 08/08/2020 1615   BILIDIR 0.10 11/03/2016 1155   IBILI 0.2 11/14/2019 1537      Component Value Date/Time   TSH 1.57 08/13/2021 0000   TSH 1.60 04/03/2021 1053   Nutritional Lab Results  Component Value Date   VD25OH 60.7 09/11/2022   VD25OH 51.6 04/23/2022   VD25OH 43.8 12/02/2021    Attestations:   This encounter took 40 total minutes of time including any pre-visit and post-visit time spent on this date of service, including taking a thorough history, reviewing any labs and/or imaging, reviewing prior notes, as well as documenting in the electronic health record on the date of service. Over 50% of that time was in direct face-to-face counseling and coordinating care for the patient today  I, Clinical biochemist, acting as a Stage manager for [employer], DO., have compiled all relevant documentation for today's office visit on behalf of Meghan Lot, DO, while in the presence of [employer], DO.  I have reviewed the above documentation for accuracy and completeness, and I agree with the above. [name], D.O.  The 21st Century Cures Act was signed into law in 2016 which includes the topic of electronic health records.  This provides immediate access to information in MyChart.  This includes consultation notes, operative notes, office notes, lab results and pathology reports.  If you have any questions about what you read please let us know at your next visit so we can discuss your concerns and take corrective action if need be.  We are right  here with you.

## 2023-02-03 ENCOUNTER — Telehealth: Payer: Self-pay | Admitting: Internal Medicine

## 2023-02-03 ENCOUNTER — Other Ambulatory Visit: Payer: Self-pay

## 2023-02-03 DIAGNOSIS — E039 Hypothyroidism, unspecified: Secondary | ICD-10-CM

## 2023-02-03 MED ORDER — LEVOTHYROXINE SODIUM 75 MCG PO TABS: 75.0000 ug | ORAL_TABLET | Freq: Every day | ORAL | 0 refills | Status: DC

## 2023-02-03 NOTE — Telephone Encounter (Signed)
Medication refill request has been sent to Dr. Yetta Barre

## 2023-02-03 NOTE — Telephone Encounter (Signed)
Pt called once before about her refill on levothyroxine (SYNTHROID) 75 MCG tablet on 11.8.24 and was just calling back to see what to status was on it.  PLEASE ADVISE, THANKS

## 2023-02-05 ENCOUNTER — Ambulatory Visit (INDEPENDENT_AMBULATORY_CARE_PROVIDER_SITE_OTHER): Payer: BC Managed Care – PPO | Admitting: Family Medicine

## 2023-02-05 ENCOUNTER — Ambulatory Visit: Payer: BC Managed Care – PPO | Attending: Cardiology | Admitting: Cardiology

## 2023-02-05 ENCOUNTER — Encounter: Payer: Self-pay | Admitting: Cardiology

## 2023-02-05 VITALS — BP 112/72 | Ht 67.0 in | Wt 223.8 lb

## 2023-02-05 DIAGNOSIS — I5032 Chronic diastolic (congestive) heart failure: Secondary | ICD-10-CM

## 2023-02-05 DIAGNOSIS — I10 Essential (primary) hypertension: Secondary | ICD-10-CM

## 2023-02-05 DIAGNOSIS — I2510 Atherosclerotic heart disease of native coronary artery without angina pectoris: Secondary | ICD-10-CM | POA: Diagnosis not present

## 2023-02-05 MED ORDER — ISOSORBIDE MONONITRATE ER 30 MG PO TB24: 30.0000 mg | ORAL_TABLET | Freq: Every day | ORAL | 3 refills | Status: DC

## 2023-02-05 MED ORDER — NITROGLYCERIN 0.4 MG SL SUBL: 0.4000 mg | SUBLINGUAL_TABLET | SUBLINGUAL | 3 refills | Status: AC | PRN

## 2023-02-05 MED ORDER — DAPAGLIFLOZIN PROPANEDIOL 10 MG PO TABS: 10.0000 mg | ORAL_TABLET | Freq: Every day | ORAL | 3 refills | Status: DC

## 2023-02-05 MED ORDER — METOPROLOL SUCCINATE ER 50 MG PO TB24: 50.0000 mg | ORAL_TABLET | Freq: Every day | ORAL | 3 refills | Status: DC

## 2023-02-05 NOTE — Patient Instructions (Signed)
Medication Instructions:  Your physician recommends that you continue on your current medications as directed. Please refer to the Current Medication list given to you today.  *If you need a refill on your cardiac medications before your next appointment, please call your pharmacy*   Follow-Up: At [facility], you and your health needs are our priority.  As part of our continuing mission to provide you with exceptional heart care, we have created designated Provider Care Teams.  These Care Teams include your primary Cardiologist (physician) and Advanced Practice Providers (APPs -  Physician Assistants and Nurse Practitioners) who all work together to provide you with the care you need, when you need it.   Your next appointment:   1 year(s)  Provider:   Thomasene Ripple, DO   Other Instructions You are invited to attend: [facility] event on February 1st at [facility] [place], [place], [place] [address]) from 8am-12pm. Feel free to invite other women to attend!  See you there!

## 2023-02-05 NOTE — Progress Notes (Signed)
Cardiology Office Note:    Date:  02/09/2023   ID:  [name], DOB 18-Apr-1955, MRN [id number]  PCP:  Etta Grandchild, MD  Cardiologist:  Thomasene Ripple, DO  Electrophysiologist:  None   Referring MD: Etta Grandchild, MD   " I am doing fine"  History of Present Illness:    [name] is a 67 y.o. female with a hx of lupus erythematous, coronary artery disease with prior history of stenting and ultimately four-vessel bypass in 2018, mitral valve annuloplasty at the time of her CABG in 2018, postoperative atrial fibrillation, peripheral artery disease and ascending aortic aneurysm.   Here today for a follow up visit. She presents with a chronic upper back pain. The pain is constant and does not seem to be associated with any cardiac symptoms such as shortness of breath or chest pain. The patient is active, engaging in regular exercise including cardio and weight lifting. The patient has been monitoring her heart rate, which remains in the 64-65 range. The patient has lost 75 pounds and is working on losing more weight. The patient is currently on multiple medications including aspirin, Farxiga, a diuretic, Imdur, metoprolol, nitroglycerin, and Crestor. The patient has not needed to use nitroglycerin for the past two years.   Past Medical History:  Diagnosis Date   Back pain    Bilateral swelling of feet and ankles    CAD (coronary artery disease)    a. remote stents/angioplasty. b. s/p CABG 01/2017 with mitral valve annuloplasty.   Chest pain    Constipation    Diastolic heart failure (HCC)    Esophageal reflux    Gallbladder problem    GERD (gastroesophageal reflux disease)    History of MI (myocardial infarction)    Hyperlipidemia    Hypertension    Hypothyroidism    Immune thrombocytopenic purpura (HCC)    Joint pain    Kidney problem    Lupus erythematosus    Mitral valve insufficiency and aortic valve insufficiency    Myocardial infarction (HCC)    x 2    Other fatigue    PONV (postoperative nausea and vomiting)    Postoperative atrial fibrillation (HCC)    Renal disease    Severe mitral regurgitation    a. s/p MV annuloplasty 01/2017 at time of CABG.   Shortness of breath on exertion    Sleep apnea    Status post mitral valve annuloplasty    Unspecified disease of pericardium     Past Surgical History:  Procedure Laterality Date   CHOLECYSTECTOMY, LAPAROSCOPIC  2010   CORONARY ARTERY BYPASS GRAFT N/A 01/30/2017   Procedure: CORONARY ARTERY BYPASS GRAFTING (CABG), Times four  using the right saphaneous vein, harvested endoscopicly.  and left internal mammary artery .;  Surgeon: Alleen Borne, MD;  Location: [facility] OR;  Service: Open Heart Surgery;  Laterality: N/A;   LEFT HEART CATH AND CORONARY ANGIOGRAPHY N/A 01/23/2017   Procedure: LEFT HEART CATH AND CORONARY ANGIOGRAPHY;  Surgeon: Lyn Records, MD;  Location: [facility] INVASIVE CV LAB;  Service: Cardiovascular;  Laterality: N/A;   MITRAL VALVE REPAIR N/A 01/30/2017   Procedure: MITRAL VALVE REPAIR (MVR);  Surgeon: Alleen Borne, MD;  Location: [facility] OR;  Service: Open Heart Surgery;  Laterality: N/A;   plastic surgical repair      dog bite to leg   RIGHT/LEFT HEART CATH AND CORONARY/GRAFT ANGIOGRAPHY N/A 02/26/2021   Procedure: RIGHT/LEFT HEART CATH AND CORONARY/GRAFT ANGIOGRAPHY;  Surgeon: Verdis Prime  W, MD;  Location: [facility] INVASIVE CV LAB;  Service: Cardiovascular;  Laterality: N/A;   SPLENECTOMY  5-04   TEE WITHOUT CARDIOVERSION N/A 01/27/2017   Procedure: TRANSESOPHAGEAL ECHOCARDIOGRAM (TEE);  Surgeon: Thurmon Fair, MD;  Location: [facility] ENDOSCOPY;  Service: Cardiovascular;  Laterality: N/A;   TEE WITHOUT CARDIOVERSION N/A 01/30/2017   Procedure: TRANSESOPHAGEAL ECHOCARDIOGRAM (TEE);  Surgeon: Alleen Borne, MD;  Location: [facility] OR;  Service: Open Heart Surgery;  Laterality: N/A;   ULTRASOUND GUIDANCE FOR VASCULAR ACCESS  01/23/2017   Procedure: Ultrasound Guidance For Vascular Access;  Surgeon:  Lyn Records, MD;  Location: [facility] INVASIVE CV LAB;  Service: Cardiovascular;;    Current Medications: Current Meds  Medication Sig   albuterol (VENTOLIN HFA) 108 (90 Base) MCG/ACT inhaler Inhale 2 puffs into the lungs every 6 (six) hours as needed for wheezing or shortness of breath.   aspirin 81 MG chewable tablet Chew 1 tablet (81 mg total) by mouth daily.   azaTHIOprine (IMURAN) 50 MG tablet TAKE 1 TABLET BY MOUTH IN THE MORNING AND IN THE EVENING   cyclobenzaprine (FLEXERIL) 5 MG tablet Take 1 tablet (5 mg total) by mouth 3 (three) times daily as needed.   fluticasone (FLONASE) 50 MCG/ACT nasal spray SPRAY 2 SPRAYS INTO EACH NOSTRIL EVERY DAY   furosemide (LASIX) 20 MG tablet TAKE 1 TABLET BY MOUTH EVERY DAY   hydroxychloroquine (PLAQUENIL) 200 MG tablet Take 200 mg by mouth daily.    indapamide (LOZOL) 1.25 MG tablet TAKE 1 TABLET BY MOUTH EVERY DAY   levothyroxine (SYNTHROID) 75 MCG tablet Take 1 tablet (75 mcg total) by mouth daily before breakfast. Please keep upcoming appointment with Dr. Yetta Barre for further refills.   LINZESS 290 MCG CAPS capsule Take 290 mcg by mouth daily.   pantoprazole (PROTONIX) 40 MG tablet TAKE 1 TABLET BY MOUTH EVERY DAY   polyethylene glycol powder (GLYCOLAX/MIRALAX) 17 GM/SCOOP powder 17 gram po BID prn constipation   potassium chloride SA (KLOR-CON M) 20 MEQ tablet Take 1 tablet (20 mEq total) by mouth daily.   pregabalin (LYRICA) 50 MG capsule Take 50 mg by mouth 2 (two) times daily.   rosuvastatin (CRESTOR) 20 MG tablet Take 1 tablet (20 mg total) by mouth daily.   tirzepatide [facility] 5 MG/0.5ML Pen Inject 5 mg into the skin once a week.   Vitamin D, Ergocalciferol, (DRISDOL) 1.25 MG (50000 UNIT) CAPS capsule Take 1 capsule (50,000 Units total) by mouth every 7 (seven) days.   [DISCONTINUED] dapagliflozin propanediol (FARXIGA) 10 MG TABS tablet TAKE 1 TABLET BY MOUTH EVERY DAY   [DISCONTINUED] isosorbide mononitrate (IMDUR) 30 MG 24 hr tablet TAKE 1  TABLET BY MOUTH EVERY DAY   [DISCONTINUED] metoprolol succinate (TOPROL-XL) 50 MG 24 hr tablet Take 1 tablet (50 mg total) by mouth daily.   [DISCONTINUED] nitroGLYCERIN (NITROSTAT) 0.4 MG SL tablet Place 1 tablet (0.4 mg total) under the tongue every 5 (five) minutes as needed for chest pain.     Allergies:   Amlodipine and Cellcept [mycophenolate]   Social History   Socioeconomic History   Marital status: Single    Spouse name: Not on file   Number of children: 2   Years of education: Not on file   Highest education level: Master's degree (e.g., MA, MS, MEng, MEd, MSW, MBA)  Occupational History   OccupationPassenger transport manager    Employer: [employer]   Tobacco Use   Smoking status: Former    Current packs/day: 0.00  Average packs/day: 1 pack/day for 30.0 years (30.0 ttl pk-yrs)    Types: Cigarettes    Start date: 03/23/1986    Quit date: 12/23/2015    Years since quitting: 7.1   Smokeless tobacco: Never  Vaping Use   Vaping status: Never Used  Substance and Sexual Activity   Alcohol use: No   Drug use: No   Sexual activity: Not Currently    Comment: 1st intercourse 67 yo-More than 5 partners  Other Topics Concern   Not on file  Social History Narrative   HSG,  graduated from [employer] in [place]. In college [name] -grad '13 with relgious studies. [employer] - Fall '13 for MA-divinity. Marrried '73 - 2 years/ divorced. 2 son- '73, '75 - CP.    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    Social Determinants of Health   Financial Resource Strain: Low Risk  (06/12/2022)   Overall Financial Resource Strain (CARDIA)    Difficulty of Paying Living Expenses: Not hard at all  Food Insecurity: No Food Insecurity (06/12/2022)   Hunger Vital Sign    Worried About Running Out of Food in the Last Year: Never true    Ran Out of Food in the Last  Year: Never true  Transportation Needs: No Transportation Needs (06/12/2022)   PRAPARE - Administrator, Civil Service (Medical): No    Lack of Transportation (Non-Medical): No  Physical Activity: Sufficiently Active (06/12/2022)   Exercise Vital Sign    Days of Exercise per Week: 3 days    Minutes of Exercise per Session: 60 min  Stress: No Stress Concern Present (06/12/2022)   [employer] of Occupational Health - Occupational Stress Questionnaire    Feeling of Stress : Not at all  Social Connections: Moderately Integrated (06/12/2022)   Social Connection and Isolation Panel [NHANES]    Frequency of Communication with Friends and Family: More than three times a week    Frequency of Social Gatherings with Friends and Family: More than three times a week    Attends Religious Services: More than 4 times per year    Active Member of [employer] or Organizations: Yes    Attends Engineer, structural: More than 4 times per year    Marital Status: Never married     Family History: The patient's family history includes Breast cancer in her mother and sister; Cancer in her mother and paternal grandfather; Fibromyalgia in her mother; GER disease in her father; Heart attack in her maternal grandmother; Heart disease in her father and mother; Hyperlipidemia in her mother; Hypertension in her father; Paget's disease of bone in her mother.  ROS:   Review of Systems  Constitution: Negative for decreased appetite, fever and weight gain.  HENT: Negative for congestion, ear discharge, hoarse voice and sore throat.   Eyes: Negative for discharge, redness, vision loss in right eye and visual halos.  Cardiovascular: Negative for chest pain, dyspnea on exertion, leg swelling, orthopnea and palpitations.  Respiratory: Negative for cough, hemoptysis, shortness of breath and snoring.   Endocrine: Negative for heat intolerance and polyphagia.  Hematologic/Lymphatic: Negative for bleeding  problem. Does not bruise/bleed easily.  Skin: Negative for flushing, nail changes, rash and suspicious lesions.  Musculoskeletal: Negative for arthritis, joint pain, muscle cramps, myalgias, neck pain and stiffness.  Gastrointestinal: Negative for abdominal pain, bowel incontinence, diarrhea and excessive appetite.  Genitourinary: Negative  for decreased libido, genital sores and incomplete emptying.  Neurological: Negative for brief paralysis, focal weakness, headaches and loss of balance.  Psychiatric/Behavioral: Negative for altered mental status, depression and suicidal ideas.  Allergic/Immunologic: Negative for HIV exposure and persistent infections.    EKGs/Labs/Other Studies Reviewed:    The following studies were reviewed today:   EKG:  The ekg ordered today demonstrates sinus rhythm,   Recent Labs: 04/23/2022: ALT 15; BUN 24; Creatinine, Ser 1.22; Potassium 4.8; Sodium 142  Recent Lipid Panel    Component Value Date/Time   CHOL 134 04/23/2022 1456   TRIG 57 04/23/2022 1456   HDL 55 04/23/2022 1456   CHOLHDL 3.0 12/02/2021 0905   CHOLHDL 2.5 02/25/2021 0757   VLDL 7 02/25/2021 0757   LDLCALC 67 04/23/2022 1456   LDLCALC 68 11/14/2019 1537   LDLDIRECT 321.8 02/27/2012 1202    Physical Exam:    VS:  BP 112/72 (BP Location: Right Arm, Patient Position: Sitting, Cuff Size: Normal)   Ht 5\' 7"  (1.702 m)   Wt 223 lb 12.8 oz (101.5 kg)   SpO2 97%   BMI 35.05 kg/m     Wt Readings from Last 3 Encounters:  02/05/23 223 lb 12.8 oz (101.5 kg)  02/02/23 218 lb (98.9 kg)  01/12/23 219 lb (99.3 kg)     GEN: Well nourished, well developed in no acute distress HEENT: Normal NECK: No JVD; No carotid bruits LYMPHATICS: No lymphadenopathy CARDIAC: S1S2 noted,RRR, no murmurs, rubs, gallops RESPIRATORY:  Clear to auscultation without rales, wheezing or rhonchi  ABDOMEN: Soft, non-tender, non-distended, +bowel sounds, no guarding. EXTREMITIES: No edema, No cyanosis, no  clubbing MUSCULOSKELETAL:  No deformity  SKIN: Warm and dry NEUROLOGIC:  Alert and oriented x 3, non-focal PSYCHIATRIC:  Normal affect, good insight  ASSESSMENT:    1. Primary hypertension   2. Chronic heart failure with preserved ejection fraction (HCC)   3. Essential hypertension   4. Coronary artery disease involving native coronary artery of native heart without angina pectoris    PLAN:    Upper Back Pain Chronic, constant pain in the upper back. No associated cardiac symptoms. Likely musculoskeletal in nature due to weightlifting. -Recommend obtaining an x-ray through primary care provider. -Advise use of lidocaine roll-on for pain relief and muscle relaxation.  Ischemic Cardiomyopathy Stable on current regimen. No new symptoms of heart failure. Patient is active and exercising regularly. -Continue current medications:Aspirin, Farxiga, Isosorbide, Metoprolol, Nitroglycerin, and Crestor. -Consider increasing Crestor dose or adding additional medication if LDL remains above 55 at next visit.  General Health Maintenance -Encourage continued exercise and weight loss. -Invite to upcoming women's heart community event in February. Clinically appears to be euvolemic no need for any medication changes at this time.  The patient understands the need to lose weight with diet and exercise. We have discussed specific strategies for this.  Blood pressure is acceptable, continue with current antihypertensive regimen.  Hyperlipidemia - continue with current statin medication.  The patient is in agreement with the above plan. The patient left the office in stable condition.  The patient will follow up in 1 year   Medication Adjustments/Labs and Tests Ordered: Current medicines are reviewed at length with the patient today.  Concerns regarding medicines are outlined above.  Orders Placed This Encounter  Procedures   EKG 12-Lead   Meds ordered this encounter  Medications    metoprolol succinate (TOPROL-XL) 50 MG 24 hr tablet    Sig: Take 1 tablet (50 mg total) by mouth  daily.    Dispense:  90 tablet    Refill:  3   isosorbide mononitrate (IMDUR) 30 MG 24 hr tablet    Sig: Take 1 tablet (30 mg total) by mouth daily.    Dispense:  90 tablet    Refill:  3   nitroGLYCERIN (NITROSTAT) 0.4 MG SL tablet    Sig: Place 1 tablet (0.4 mg total) under the tongue every 5 (five) minutes as needed for chest pain.    Dispense:  25 tablet    Refill:  3   dapagliflozin propanediol (FARXIGA) 10 MG TABS tablet    Sig: Take 1 tablet (10 mg total) by mouth daily.    Dispense:  90 tablet    Refill:  3    Patient Instructions  Medication Instructions:  Your physician recommends that you continue on your current medications as directed. Please refer to the Current Medication list given to you today.  *If you need a refill on your cardiac medications before your next appointment, please call your pharmacy*   Follow-Up: At [facility], you and your health needs are our priority.  As part of our continuing mission to provide you with exceptional heart care, we have created designated Provider Care Teams.  These Care Teams include your primary Cardiologist (physician) and Advanced Practice Providers (APPs -  Physician Assistants and Nurse Practitioners) who all work together to provide you with the care you need, when you need it.   Your next appointment:   1 year(s)  Provider:   Thomasene Ripple, DO   Other Instructions You are invited to attend: Women's Heart Community event on February 1st at [facility] [place], [place], [place] [address]) from 8am-12pm. Feel free to invite other women to attend!  See you there!      Adopting a Healthy Lifestyle.  Know what a healthy weight is for you (roughly BMI <25) and aim to maintain this   Aim for 7+ servings of fruits and vegetables daily   65-80+ fluid ounces of water or unsweet tea for healthy kidneys    Limit to max 1 drink of alcohol per day; avoid smoking/tobacco   Limit animal fats in diet for cholesterol and heart health - choose grass fed whenever available   Avoid highly processed foods, and foods high in saturated/trans fats   Aim for low stress - take time to unwind and care for your mental health   Aim for 150 min of moderate intensity exercise weekly for heart health, and weights twice weekly for bone health   Aim for 7-9 hours of sleep daily   When it comes to diets, agreement about the perfect plan isnt easy to find, even among the experts. Experts at the [facility] [employer] developed an idea known as the Healthy Eating Plate. Just imagine a plate divided into logical, healthy portions.   The emphasis is on diet quality:   Load up on vegetables and fruits - one-half of your plate: Aim for color and variety, and remember that potatoes dont count.   Go for whole grains - one-quarter of your plate: Whole wheat, barley, wheat berries, quinoa, oats, brown rice, and foods made with them. If you want pasta, go with whole wheat pasta.   Protein power - one-quarter of your plate: Fish, chicken, beans, and nuts are all healthy, versatile protein sources. Limit red meat.   The diet, however, does go beyond the plate, offering a few other suggestions.  Use healthy plant oils, such as olive, canola, soy, corn, sunflower and peanut. Check the labels, and avoid partially hydrogenated oil, which have unhealthy trans fats.   If youre thirsty, drink water. Coffee and tea are good in moderation, but skip sugary drinks and limit milk and dairy products to one or two daily servings.   The type of carbohydrate in the diet is more important than the amount. Some sources of carbohydrates, such as vegetables, fruits, whole grains, and beans-are healthier than others.   Finally, stay active  Signed, Thomasene Ripple, DO  02/09/2023 7:09 PM    [facility]

## 2023-02-28 ENCOUNTER — Other Ambulatory Visit: Payer: Self-pay | Admitting: Internal Medicine

## 2023-02-28 DIAGNOSIS — E039 Hypothyroidism, unspecified: Secondary | ICD-10-CM

## 2023-03-02 ENCOUNTER — Ambulatory Visit (INDEPENDENT_AMBULATORY_CARE_PROVIDER_SITE_OTHER): Payer: BC Managed Care – PPO | Admitting: Family Medicine

## 2023-03-02 ENCOUNTER — Encounter: Payer: Self-pay | Admitting: Internal Medicine

## 2023-03-02 ENCOUNTER — Ambulatory Visit (INDEPENDENT_AMBULATORY_CARE_PROVIDER_SITE_OTHER): Payer: BC Managed Care – PPO | Admitting: Internal Medicine

## 2023-03-02 VITALS — BP 120/80 | HR 68 | Temp 97.6°F | Resp 16 | Ht 67.0 in | Wt 221.6 lb

## 2023-03-02 DIAGNOSIS — Z0000 Encounter for general adult medical examination without abnormal findings: Secondary | ICD-10-CM

## 2023-03-02 DIAGNOSIS — N1831 Chronic kidney disease, stage 3a: Secondary | ICD-10-CM

## 2023-03-02 DIAGNOSIS — E785 Hyperlipidemia, unspecified: Secondary | ICD-10-CM | POA: Diagnosis not present

## 2023-03-02 DIAGNOSIS — Z2911 Encounter for prophylactic immunotherapy for respiratory syncytial virus (RSV): Secondary | ICD-10-CM | POA: Insufficient documentation

## 2023-03-02 DIAGNOSIS — I10 Essential (primary) hypertension: Secondary | ICD-10-CM

## 2023-03-02 DIAGNOSIS — E039 Hypothyroidism, unspecified: Secondary | ICD-10-CM | POA: Diagnosis not present

## 2023-03-02 DIAGNOSIS — Z0001 Encounter for general adult medical examination with abnormal findings: Secondary | ICD-10-CM

## 2023-03-02 DIAGNOSIS — Z23 Encounter for immunization: Secondary | ICD-10-CM | POA: Insufficient documentation

## 2023-03-02 LAB — URINALYSIS, ROUTINE W REFLEX MICROSCOPIC
Bilirubin Urine: NEGATIVE
Hgb urine dipstick: NEGATIVE
Ketones, ur: NEGATIVE
Leukocytes,Ua: NEGATIVE
Nitrite: NEGATIVE
RBC / HPF: NONE SEEN (ref 0–?)
Specific Gravity, Urine: 1.01 (ref 1.000–1.030)
Total Protein, Urine: NEGATIVE
Urine Glucose: >=1000 — AB
Urobilinogen, UA: 0.2 (ref 0.0–1.0)
pH: 6.5 (ref 5.0–8.0)

## 2023-03-02 LAB — CBC WITH DIFFERENTIAL/PLATELET
Basophils Absolute: 0 10*3/uL (ref 0.0–0.1)
Basophils Relative: 1 % (ref 0.0–3.0)
Eosinophils Absolute: 0.1 10*3/uL (ref 0.0–0.7)
Eosinophils Relative: 4.8 % (ref 0.0–5.0)
HCT: 43.1 % (ref 36.0–46.0)
Hemoglobin: 14.2 g/dL (ref 12.0–15.0)
Lymphocytes Relative: 19.8 % (ref 12.0–46.0)
Lymphs Abs: 0.6 10*3/uL — ABNORMAL LOW (ref 0.7–4.0)
MCHC: 33 g/dL (ref 30.0–36.0)
MCV: 102.7 fL — ABNORMAL HIGH (ref 78.0–100.0)
Monocytes Absolute: 0.5 10*3/uL (ref 0.1–1.0)
Monocytes Relative: 17.9 % — ABNORMAL HIGH (ref 3.0–12.0)
Neutro Abs: 1.6 10*3/uL (ref 1.4–7.7)
Neutrophils Relative %: 56.5 % (ref 43.0–77.0)
Platelets: 187 10*3/uL (ref 150.0–400.0)
RBC: 4.2 Mil/uL (ref 3.87–5.11)
RDW: 14.9 % (ref 11.5–15.5)
WBC: 2.9 10*3/uL — ABNORMAL LOW (ref 4.0–10.5)

## 2023-03-02 LAB — TSH: TSH: 1.06 u[IU]/mL (ref 0.35–5.50)

## 2023-03-02 MED ORDER — AREXVY 120 MCG/0.5ML IM SUSR: 0.5000 mL | Freq: Once | INTRAMUSCULAR | 0 refills | Status: AC

## 2023-03-02 MED ORDER — BOOSTRIX 5-2.5-18.5 LF-MCG/0.5 IM SUSP: 0.5000 mL | Freq: Once | INTRAMUSCULAR | 0 refills | Status: AC

## 2023-03-02 NOTE — Progress Notes (Unsigned)
Subjective:  Patient ID: [name], female    DOB: 30-Mar-1955  Age: 67 y.o. MRN: [id number]  CC: Annual Exam, Hyperlipidemia, and Hypothyroidism   HPI [name] presents for a CPX and f/up ---    Discussed the use of AI scribe software for clinical note transcription with the patient, who gave verbal consent to proceed.  History of Present Illness   The patient, with a history of thyroid disease and constipation, reports no new symptoms related to her thyroid condition. She has been experiencing constipation, which has been managed with Linzess. The patient reports that the medication has been effective, perhaps too effective. She denies any symptoms of dizziness, lightheadedness, chest pain, or shortness of breath. There is no reported swelling in the legs or feet.  The patient has received a flu shot and COVID-19 vaccination earlier in the year.  The patient's blood pressure is typically low, with readings around 110/60. Today's reading was 120/80, which is within her normal range.       Outpatient Medications Prior to Visit  Medication Sig Dispense Refill   albuterol (VENTOLIN HFA) 108 (90 Base) MCG/ACT inhaler Inhale 2 puffs into the lungs every 6 (six) hours as needed for wheezing or shortness of breath. 8 g 0   aspirin 81 MG chewable tablet Chew 1 tablet (81 mg total) by mouth daily.     azaTHIOprine (IMURAN) 50 MG tablet TAKE 1 TABLET BY MOUTH IN THE MORNING AND IN THE EVENING 180 tablet 0   cyclobenzaprine (FLEXERIL) 5 MG tablet Take 1 tablet (5 mg total) by mouth 3 (three) times daily as needed. 30 tablet 0   dapagliflozin propanediol (FARXIGA) 10 MG TABS tablet Take 1 tablet (10 mg total) by mouth daily. 90 tablet 3   fluticasone (FLONASE) 50 MCG/ACT nasal spray SPRAY 2 SPRAYS INTO EACH NOSTRIL EVERY DAY (Patient taking differently: Place 2 sprays into both nostrils daily as needed for allergies or rhinitis.) 48 mL 2   furosemide (LASIX) 20 MG tablet TAKE 1  TABLET BY MOUTH EVERY DAY 90 tablet 3   hydroxychloroquine (PLAQUENIL) 200 MG tablet Take 200 mg by mouth daily.      isosorbide mononitrate (IMDUR) 30 MG 24 hr tablet Take 1 tablet (30 mg total) by mouth daily. 90 tablet 3   LINZESS 290 MCG CAPS capsule Take 290 mcg by mouth daily. Patient takes as needed     metoprolol succinate (TOPROL-XL) 50 MG 24 hr tablet Take 1 tablet (50 mg total) by mouth daily. 90 tablet 3   nitroGLYCERIN (NITROSTAT) 0.4 MG SL tablet Place 1 tablet (0.4 mg total) under the tongue every 5 (five) minutes as needed for chest pain. 25 tablet 3   polyethylene glycol powder (GLYCOLAX/MIRALAX) 17 GM/SCOOP powder 17 gram po BID prn constipation 850 g 0   potassium chloride SA (KLOR-CON M) 20 MEQ tablet Take 1 tablet (20 mEq total) by mouth daily. 30 tablet 0   pregabalin (LYRICA) 50 MG capsule Take 50 mg by mouth 2 (two) times daily. Patient taking as needed     rosuvastatin (CRESTOR) 20 MG tablet Take 1 tablet (20 mg total) by mouth daily. 90 tablet 3   Vitamin D, Ergocalciferol, (DRISDOL) 1.25 MG (50000 UNIT) CAPS capsule Take 1 capsule (50,000 Units total) by mouth every 7 (seven) days. 4 capsule 1   levothyroxine (SYNTHROID) 75 MCG tablet Take 1 tablet (75 mcg total) by mouth daily before breakfast. Please keep upcoming appointment with Dr. Yetta Barre for further  refills. 27 tablet 0   pantoprazole (PROTONIX) 40 MG tablet TAKE 1 TABLET BY MOUTH EVERY DAY 90 tablet 3   tirzepatide (MOUNJARO) 5 MG/0.5ML Pen Inject 5 mg into the skin once a week. (Patient not taking: Reported on 03/02/2023) 2 mL 0   indapamide (LOZOL) 1.25 MG tablet TAKE 1 TABLET BY MOUTH EVERY DAY (Patient not taking: Reported on 03/02/2023) 90 tablet 0   No facility-administered medications prior to visit.    ROS Review of Systems  Constitutional: Negative.  Negative for chills, diaphoresis, fatigue and fever.  HENT: Negative.    Eyes: Negative.   Respiratory:  Negative for cough, chest tightness, shortness of  breath and wheezing.   Cardiovascular:  Negative for chest pain, palpitations and leg swelling.  Gastrointestinal:  Positive for constipation. Negative for abdominal pain, blood in stool, nausea and vomiting.  Endocrine: Negative.  Negative for cold intolerance and heat intolerance.  Genitourinary: Negative.  Negative for difficulty urinating.  Musculoskeletal:  Positive for arthralgias. Negative for joint swelling and myalgias.  Skin: Negative.   Neurological: Negative.  Negative for dizziness and weakness.  Hematological:  Negative for adenopathy. Does not bruise/bleed easily.  Psychiatric/Behavioral: Negative.      Objective:  BP 120/80 (BP Location: Left Arm, Patient Position: Sitting, Cuff Size: Normal)   Pulse 68   Temp 97.6 F (36.4 C) (Oral)   Resp 16   Ht 5\' 7"  (1.702 m)   Wt 221 lb 9.6 oz (100.5 kg)   SpO2 97%   BMI 34.71 kg/m   BP Readings from Last 3 Encounters:  03/02/23 120/80  02/05/23 112/72  02/02/23 108/71    Wt Readings from Last 3 Encounters:  03/02/23 221 lb 9.6 oz (100.5 kg)  02/05/23 223 lb 12.8 oz (101.5 kg)  02/02/23 218 lb (98.9 kg)    Physical Exam Vitals reviewed.  Constitutional:      Appearance: Normal appearance.  HENT:     Nose: Nose normal.     Mouth/Throat:     Mouth: Mucous membranes are moist.  Eyes:     General: No scleral icterus.    Conjunctiva/sclera: Conjunctivae normal.  Cardiovascular:     Rate and Rhythm: Normal rate and regular rhythm.     Heart sounds: Murmur heard.     Systolic murmur is present with a grade of 1/6.     No diastolic murmur is present.     No friction rub. No gallop.     Comments: 1/6 SEM LLSB Pulmonary:     Effort: Pulmonary effort is normal.     Breath sounds: No stridor. No wheezing, rhonchi or rales.  Abdominal:     General: Abdomen is flat.     Palpations: There is no mass.     Tenderness: There is no abdominal tenderness. There is no guarding.     Hernia: No hernia is present.   Musculoskeletal:        General: Normal range of motion.     Cervical back: Neck supple.     Right lower leg: No edema.     Left lower leg: No edema.  Lymphadenopathy:     Cervical: No cervical adenopathy.  Skin:    General: Skin is warm and dry.     Coloration: Skin is not pale.     Findings: No lesion or rash.  Neurological:     General: No focal deficit present.     Mental Status: She is alert.  Psychiatric:  Mood and Affect: Mood normal.        Behavior: Behavior normal.     Lab Results  Component Value Date   WBC 2.9 (L) 03/02/2023   HGB 14.2 03/02/2023   HCT 43.1 03/02/2023   PLT 187.0 03/02/2023   GLUCOSE 88 03/02/2023   CHOL 129 03/02/2023   TRIG 60.0 03/02/2023   HDL 45.50 03/02/2023   LDLDIRECT 321.8 02/27/2012   LDLCALC 71 03/02/2023   ALT 16 03/02/2023   AST 26 03/02/2023   NA 142 03/02/2023   K 4.1 03/02/2023   CL 105 03/02/2023   CREATININE 1.23 (H) 03/02/2023   BUN 24 (H) 03/02/2023   CO2 29 03/02/2023   TSH 1.06 03/02/2023   INR 1.09 07/01/2017   HGBA1C 5.2 04/23/2022    MM DIAG BREAST TOMO UNI LEFT  Result Date: 05/22/2022 CLINICAL DATA:  67 year old female for further evaluation of possible LEFT breast asymmetry on screening mammogram. EXAM: DIGITAL DIAGNOSTIC UNILATERAL LEFT MAMMOGRAM WITH TOMOSYNTHESIS TECHNIQUE: Left digital diagnostic mammography and breast tomosynthesis was performed. COMPARISON:  Prior mammograms dating back to 2007. ACR Breast Density Category b: There are scattered areas of fibroglandular density. FINDINGS: 2D/3D spot compression views of the LEFT breast demonstrate dispersal of the screening study asymmetry without persistent abnormality in this area. IMPRESSION: No persistent abnormality at the site of the screening study finding. RECOMMENDATION: Bilateral screening mammogram in 1 year. I have discussed the findings and recommendations with the patient. If applicable, a reminder letter will be sent to the patient  regarding the next appointment. BI-RADS CATEGORY  1: Negative. Electronically Signed   By: Harmon Pier M.D.   On: 05/22/2022 16:15   Assessment & Plan:   Acquired hypothyroidism- She is euthyroid. -     TSH; Future -     CBC with Differential/Platelet; Future -     Levothyroxine Sodium; Take 1 tablet (75 mcg total) by mouth daily before breakfast.  Dispense: 90 tablet; Refill: 0  Hyperlipidemia LDL goal <70 - LDL goal achieved. Doing well on the statin  -     Lipid panel; Future -     TSH; Future -     Hepatic function panel; Future  Stage 3a chronic kidney disease (HCC) - Renal function is stable. -     Urinalysis, Routine w reflex microscopic; Future -     Basic metabolic panel; Future  Essential hypertension- BP is well controlled. -     Urinalysis, Routine w reflex microscopic; Future -     Hepatic function panel; Future -     CBC with Differential/Platelet; Future -     Basic metabolic panel; Future  Need for prophylactic vaccination with combined diphtheria-tetanus-pertussis (DTP) vaccine -     Boostrix; Inject 0.5 mLs into the muscle once for 1 dose.  Dispense: 0.5 mL; Refill: 0  Need for RSV vaccination -     Arexvy; Inject 0.5 mLs into the muscle once for 1 dose.  Dispense: 0.5 mL; Refill: 0  Encounter for general adult medical examination with abnormal findings- Exam completed, labs reviewed, vaccines reviewed and updated, cancer screenings are UTD, pt ed material was given.      Follow-up: Return in about 6 months (around 08/31/2023).  Sanda Linger, MD

## 2023-03-02 NOTE — Patient Instructions (Signed)

## 2023-03-03 ENCOUNTER — Other Ambulatory Visit: Payer: Self-pay

## 2023-03-03 LAB — HEPATIC FUNCTION PANEL
ALT: 16 U/L (ref 0–35)
AST: 26 U/L (ref 0–37)
Albumin: 3.8 g/dL (ref 3.5–5.2)
Alkaline Phosphatase: 84 U/L (ref 39–117)
Bilirubin, Direct: 0.1 mg/dL (ref 0.0–0.3)
Total Bilirubin: 0.6 mg/dL (ref 0.2–1.2)
Total Protein: 7.2 g/dL (ref 6.0–8.3)

## 2023-03-03 LAB — LIPID PANEL
Cholesterol: 129 mg/dL (ref 0–200)
HDL: 45.5 mg/dL (ref >39.00)
LDL Cholesterol: 71 mg/dL (ref 0–99)
NonHDL: 83.47
Total CHOL/HDL Ratio: 3
Triglycerides: 60 mg/dL (ref 0.0–149.0)
VLDL: 12 mg/dL (ref 0.0–40.0)

## 2023-03-03 LAB — BASIC METABOLIC PANEL
BUN: 24 mg/dL — ABNORMAL HIGH (ref 6–23)
CO2: 29 meq/L (ref 19–32)
Calcium: 9.2 mg/dL (ref 8.4–10.5)
Chloride: 105 meq/L (ref 96–112)
Creatinine, Ser: 1.23 mg/dL — ABNORMAL HIGH (ref 0.40–1.20)
GFR: 45.59 mL/min — ABNORMAL LOW (ref >60.00)
Glucose, Bld: 88 mg/dL (ref 70–99)
Potassium: 4.1 meq/L (ref 3.5–5.1)
Sodium: 142 meq/L (ref 135–145)

## 2023-03-03 MED ORDER — PANTOPRAZOLE SODIUM 40 MG PO TBEC: 40.0000 mg | DELAYED_RELEASE_TABLET | Freq: Every day | ORAL | 3 refills | Status: DC

## 2023-03-04 ENCOUNTER — Telehealth: Payer: Self-pay | Admitting: Internal Medicine

## 2023-03-04 MED ORDER — LEVOTHYROXINE SODIUM 75 MCG PO TABS: 75.0000 ug | ORAL_TABLET | Freq: Every day | ORAL | 0 refills | Status: DC

## 2023-03-04 NOTE — Telephone Encounter (Signed)
The levo thryroxine was sent to the wrong pharmacy please resend to [facility] [address] [place]

## 2023-03-04 NOTE — Telephone Encounter (Signed)
Pt called again about message down below

## 2023-03-05 ENCOUNTER — Other Ambulatory Visit: Payer: Self-pay

## 2023-03-05 DIAGNOSIS — E039 Hypothyroidism, unspecified: Secondary | ICD-10-CM

## 2023-03-05 MED ORDER — LEVOTHYROXINE SODIUM 75 MCG PO TABS: 75.0000 ug | ORAL_TABLET | Freq: Every day | ORAL | 1 refills | Status: DC

## 2023-03-05 NOTE — Telephone Encounter (Signed)
Medication has been sent to the correct pharmacy. Patient is aware

## 2023-03-09 ENCOUNTER — Ambulatory Visit (INDEPENDENT_AMBULATORY_CARE_PROVIDER_SITE_OTHER): Payer: BC Managed Care – PPO | Admitting: Physician Assistant

## 2023-03-09 ENCOUNTER — Encounter (INDEPENDENT_AMBULATORY_CARE_PROVIDER_SITE_OTHER): Payer: Self-pay | Admitting: Physician Assistant

## 2023-03-09 VITALS — BP 105/69 | HR 60 | Temp 97.5°F | Ht 67.0 in | Wt 217.0 lb

## 2023-03-09 DIAGNOSIS — Z6833 Body mass index (BMI) 33.0-33.9, adult: Secondary | ICD-10-CM

## 2023-03-09 DIAGNOSIS — E559 Vitamin D deficiency, unspecified: Secondary | ICD-10-CM | POA: Diagnosis not present

## 2023-03-09 DIAGNOSIS — I10 Essential (primary) hypertension: Secondary | ICD-10-CM

## 2023-03-09 DIAGNOSIS — E88819 Insulin resistance, unspecified: Secondary | ICD-10-CM | POA: Diagnosis not present

## 2023-03-09 DIAGNOSIS — K5903 Drug induced constipation: Secondary | ICD-10-CM

## 2023-03-09 DIAGNOSIS — I25709 Atherosclerosis of coronary artery bypass graft(s), unspecified, with unspecified angina pectoris: Secondary | ICD-10-CM | POA: Diagnosis not present

## 2023-03-09 DIAGNOSIS — Z6835 Body mass index (BMI) 35.0-35.9, adult: Secondary | ICD-10-CM

## 2023-03-09 MED ORDER — VITAMIN D (ERGOCALCIFEROL) 1.25 MG (50000 UNIT) PO CAPS: 50000.0000 [IU] | ORAL_CAPSULE | ORAL | 1 refills | Status: DC

## 2023-03-09 MED ORDER — TIRZEPATIDE 5 MG/0.5ML ~~LOC~~ SOAJ: 5.0000 mg | SUBCUTANEOUS | 0 refills | Status: DC

## 2023-03-09 NOTE — Progress Notes (Signed)
SUBJECTIVE:  Chief Complaint: Obesity  Interim History: [name] is down 1 lb since her last visit.  Down 41 lbs since starting program. TBW loss of 15.9% since starting program 04/09/21. Down 67 lbs from peak weight of 284 lbs. - 23.6% from peak weight!!  [name], a 67 year old patient with a history of obesity, type 2 diabetes, coronary artery disease (CAD), and insulin resistance, presents for a follow-up visit regarding her obesity treatment plan. The patient has been on a regimen of Mounjaro 5 mg weekly for diabetes and Farxiga 10 mg daily. She has a history of insulin resistance characterized by polyphagia and carbohydrate cravings, for which she has been taking Mounjaro, but she has been out of her medication for several weeks and would like to resume.   The patient's CAD history includes previous stenting and a coronary artery bypass graft times four in 2018, along with a mitral valve annuloplasty. She also has a history of postoperative atrial fibrillation, peripheral arterial disease, and an ascending aortic aneurysm. She is regularly followed by cardiology for ischemic cardiomyopathy and is considered stable on her current regimen with no new symptoms of heart failure.  She also takes vitamin D, with no reported problems.  The patient has been experiencing weight loss and an increase in muscle mass. She reports smaller portion sizes in her meals, but still struggles with cravings. She has been maintaining regular physical activity, including cardio and weight lifting.  The patient also reports a history of bowel irregularity, for which she takes Linzess approximately once a Adams. [name] is here to discuss her progress with her obesity treatment plan. She is on the Category 1 Plan and states she is following her eating plan approximately 90 % of the time. She states she is exercising 60 minutes, cardio and weights 2-3 times per Adams.   OBJECTIVE: Visit Diagnoses: Problem List  Items Addressed This Visit     Vitamin D deficiency   Relevant Medications   Vitamin D, Ergocalciferol, (DRISDOL) 1.25 MG (50000 UNIT) CAPS capsule   Insulin resistance - Primary   Relevant Medications   tirzepatide (MOUNJARO) 5 MG/0.5ML Pen   Morbid obesity (HCC)-starting bmi 41.64   Relevant Medications   tirzepatide (MOUNJARO) 5 MG/0.5ML Pen   BMI 35.0-35.9,adult   Drug-induced constipation   Essential hypertension     Assessment & Plan Obesity [name] has lost 41 pounds on a weight management plan. Currently on Mounjaro 5 mg weekly but reports reduced efficacy in curbing appetite and cravings. Engages in regular cardio and weight lifting. Discussed importance of regular meals, adequate protein intake, and hydration to manage side effects. Potential dose increase of Mounjaro if cravings persist after another month. - Continue/refill Mounjaro 5 mg weekly - Monitor for another month before considering dose increase as has been off for several weeks.  - Encourage regular meals, adequate protein intake, and hydration - Continue regular physical activity -Has  Follow up with endocrinologist in January  Insulin Resistance with cravings On Mounjaro 5 mg weekly and Farxiga 10 mg daily. History of insulin resistance with polyphagia and carb cravings. Discussed importance of monitoring blood glucose levels and benefits of current medication regimen. Continue/refill Mounjaro 5 mg weekly  - Continue Farxiga 10 mg daily - Monitor blood glucose levels - Follow up with endocrinologist in January  Coronary Artery Disease (CAD) Previous stenting and CABG x4 in 2018, with mitral valve annuloplasty. Well-managed on current regimen with no new heart failure symptoms. Current medications include daily aspirin and  Isosorbide Mononitrate. Discussed potential need to increase Crestor dose or add medication if LDL >55 at next visit. - Continue current cardiac medications - Monitor LDL levels and  consider increasing Crestor dose or adding medication if LDL >55 at next visit - Follow up with cardiology regularly  Vitamin D Deficiency Vitamin D is at goal of 50.  Most recent vitamin D level was 60.7. She is on  prescription ergocalciferol 50,000 IU weekly. Reports no N/V or muscle weakness with Ergocalciferol.  Lab Results  Component Value Date   VD25OH 60.7 09/11/2022   VD25OH 51.6 04/23/2022   VD25OH 43.8 12/02/2021    Plan: Continue and refill  prescription ergocalciferol 50,000 IU weekly Low vitamin D levels can be associated with adiposity and may result in leptin resistance and weight gain. Also associated with fatigue. Currently on vitamin D supplementation without any adverse effects.  Recheck vitamin D levels several times yearly to optimize supplementation/avoid over supplementation.   Constipation [name] notes constipation.   This is likely related to GLP-1.  She has been taking Linzess about once weekly. Constipation is moderately controlled.   Plan: Continue Linzess ordered by Endocrinology as directed and monitor closely .  Increase fiber and water. Add MiraLAX once daily.  May take twice a day or every other day based on results. Add Metamucil or Citrucel daily. Add magnesium citrate daily.   General Health Maintenance Refilling vitamin D with no reported issues. Advised to maintain hydration and regular meals to manage potential medication side effects. - Refill vitamin D - Encourage regular meals and hydration  Follow-up - Follow up with endocrinologist in January - Follow up with cardiologist regularly.       Vitals Temp: (!) 97.5 F (36.4 C) BP: 105/69 Pulse Rate: 60 SpO2: 99 %   Anthropometric Measurements Height: 5\' 7"  (1.702 m) Weight: 217 lb (98.4 kg) BMI (Calculated): 33.98 Weight at Last Visit: 218 lb Weight Lost Since Last Visit: 1 lb Weight Gained Since Last Visit: 0 Starting Weight: 258 lb Total Weight Loss (lbs): 41 lb  (18.6 kg) Peak Weight: 284 lb   Body Composition  Body Fat %: 47.1 % Fat Mass (lbs): 102.4 lbs Muscle Mass (lbs): 109 lbs Total Body Water (lbs): 78.8 lbs Visceral Fat Rating : 14   Other Clinical Data Fasting: no Labs: no Today's Visit #: 26 Starting Date: 04/09/21     ASSESSMENT AND PLAN:  Diet: [name] is currently in the action stage of change. As such, her goal is to continue with weight loss efforts. She has agreed to Category 1 Plan.  Exercise: [name] has been instructed to continue exercising as is for weight loss and overall health benefits.   Behavior Modification:  We discussed the following Behavioral Modification Strategies today: increasing lean protein intake, decreasing simple carbohydrates, increasing vegetables, increase H2O intake, increase high fiber foods, no skipping meals, emotional eating strategies , holiday eating strategies, and planning for success. We discussed various medication options to help [name] with her weight loss efforts and we both agreed to resume/continue Mounjaro 5 mg weekly for primary indication of insulin resistance with polyphagia and cravings.   Return in about 4 weeks (around 04/06/2023).Marland Kitchen She was informed of the importance of frequent follow up visits to maximize her success with intensive lifestyle modifications for her multiple health conditions.  Attestation Statements:   Reviewed by clinician on day of visit: allergies, medications, problem list, medical history, surgical history, family history, social history, and previous encounter notes.   Time  spent on visit including pre-visit chart review and post-visit care and charting was 41 minutes.    Awad Gladd, PA-C

## 2023-03-10 ENCOUNTER — Telehealth (INDEPENDENT_AMBULATORY_CARE_PROVIDER_SITE_OTHER): Payer: Self-pay

## 2023-03-10 NOTE — Telephone Encounter (Signed)
Prior auth started: Key: [id number]

## 2023-03-10 NOTE — Telephone Encounter (Signed)
N/A on December 16 by [employer] NCPDP 2017 Your PA request cannot be processed. The corresponding PA request is closed. If a prior authorization is required please submit a new request. (Message [id number])

## 2023-03-11 ENCOUNTER — Telehealth (INDEPENDENT_AMBULATORY_CARE_PROVIDER_SITE_OTHER): Payer: Self-pay

## 2023-03-11 ENCOUNTER — Encounter (INDEPENDENT_AMBULATORY_CARE_PROVIDER_SITE_OTHER): Payer: Self-pay

## 2023-03-11 NOTE — Telephone Encounter (Signed)
Prior auth resubmitted Key: [id number]

## 2023-03-11 NOTE — Telephone Encounter (Signed)
Your PA request has been closed. Aborting PA. There are 2 denials within 60 days for this medication. Pleaser refer to the latest denial letter for Appeal Process.

## 2023-03-19 LAB — BASIC METABOLIC PANEL
BUN: 18 (ref 4–21)
CO2: 30 — AB (ref 13–22)
Chloride: 105 (ref 99–108)
Creatinine: 1.3 — AB (ref 0.5–1.1)
Glucose: 89
Potassium: 4.1 meq/L (ref 3.5–5.1)
Sodium: 141 (ref 137–147)

## 2023-03-19 LAB — HEPATIC FUNCTION PANEL
ALT: 22 U/L (ref 7–35)
AST: 31 (ref 13–35)
Alkaline Phosphatase: 101 (ref 25–125)
Bilirubin, Direct: 0.2
Bilirubin, Total: 0.4

## 2023-03-19 LAB — TSH: TSH: 1.5 (ref 0.41–5.90)

## 2023-03-19 LAB — CBC AND DIFFERENTIAL
HCT: 43 (ref 36–46)
Hemoglobin: 15.1 (ref 12.0–16.0)
Platelets: 208 10*3/uL (ref 150–400)
WBC: 4

## 2023-03-19 LAB — VITAMIN B12: Vitamin B-12: 2790

## 2023-03-19 LAB — HEMOGLOBIN A1C: Hemoglobin A1C: 5.3

## 2023-03-19 LAB — LIPID PANEL
Cholesterol: 130 (ref 0–200)
HDL: 49 (ref 35–70)
LDL Cholesterol: 64
Triglycerides: 47 (ref 40–160)

## 2023-03-19 LAB — VITAMIN D 25 HYDROXY (VIT D DEFICIENCY, FRACTURES): Vit D, 25-Hydroxy: 90.6

## 2023-03-19 LAB — CBC: RBC: 4.43 (ref 3.87–5.11)

## 2023-03-19 LAB — COMPREHENSIVE METABOLIC PANEL
Albumin: 4.3 (ref 3.5–5.0)
Calcium: 9.5 (ref 8.7–10.7)
eGFR: 47

## 2023-03-20 ENCOUNTER — Other Ambulatory Visit: Payer: Self-pay

## 2023-03-20 MED ORDER — ISOSORBIDE MONONITRATE ER 30 MG PO TB24: 30.0000 mg | ORAL_TABLET | Freq: Every day | ORAL | 3 refills | Status: AC

## 2023-03-20 MED ORDER — ROSUVASTATIN CALCIUM 20 MG PO TABS: 20.0000 mg | ORAL_TABLET | Freq: Every day | ORAL | 3 refills | Status: DC

## 2023-04-02 ENCOUNTER — Other Ambulatory Visit: Payer: Self-pay | Admitting: Internal Medicine

## 2023-04-02 DIAGNOSIS — Z1231 Encounter for screening mammogram for malignant neoplasm of breast: Secondary | ICD-10-CM

## 2023-04-12 ENCOUNTER — Other Ambulatory Visit: Payer: Self-pay | Admitting: Cardiology

## 2023-04-13 ENCOUNTER — Ambulatory Visit (INDEPENDENT_AMBULATORY_CARE_PROVIDER_SITE_OTHER): Payer: BC Managed Care – PPO | Admitting: Family Medicine

## 2023-04-13 ENCOUNTER — Encounter (INDEPENDENT_AMBULATORY_CARE_PROVIDER_SITE_OTHER): Payer: Self-pay | Admitting: Family Medicine

## 2023-04-13 VITALS — BP 123/72 | HR 62 | Temp 98.0°F | Ht 67.0 in | Wt 217.0 lb

## 2023-04-13 DIAGNOSIS — E88819 Insulin resistance, unspecified: Secondary | ICD-10-CM

## 2023-04-13 DIAGNOSIS — E559 Vitamin D deficiency, unspecified: Secondary | ICD-10-CM | POA: Diagnosis not present

## 2023-04-13 DIAGNOSIS — R632 Polyphagia: Secondary | ICD-10-CM

## 2023-04-13 DIAGNOSIS — Z6834 Body mass index (BMI) 34.0-34.9, adult: Secondary | ICD-10-CM

## 2023-04-13 DIAGNOSIS — Z6833 Body mass index (BMI) 33.0-33.9, adult: Secondary | ICD-10-CM

## 2023-04-13 MED ORDER — TIRZEPATIDE 7.5 MG/0.5ML ~~LOC~~ SOAJ
7.5000 mg | SUBCUTANEOUS | 0 refills | Status: DC
Start: 2023-04-13 — End: 2023-05-11

## 2023-04-13 NOTE — Progress Notes (Signed)
[name], D.O.  ABFM, ABOM Specializing in Clinical Bariatric Medicine  Office located at: [address] [place], [place]  [address]   Assessment and Plan:   [facility]   FOR THE DISEASE OF OBESITY: BMI 35.0-35.9,adult, current BMI 33.98 Morbid obesity (HCC)-starting bmi 41.64/date 04/09/21 Assessment & Plan: Since last office visit on 03/09/23 patient's  Muscle mass has increased by 1 lb. Fat mass has decreased by 0.6 lb. Total body water has increased by 1.8 lb.  Counseling done on how various foods will affect these numbers and how to maximize success  Total lbs lost to date: 41 lbs  Total weight loss percentage to date: 15.89%    Recommended Dietary Goals [name] is currently in the action stage of change. As such, her goal is to continue weight management plan.  She has agreed to: continue current plan   Behavioral Intervention We discussed the following today: continue to work on maintaining a reduced calorie state, getting the recommended amount of protein, incorporating whole foods, making healthy choices, staying well hydrated and practicing mindfulness when eating.  Additional resources provided today: None  Evidence-based interventions for health behavior change were utilized today including the discussion of self monitoring techniques, problem-solving barriers and SMART goal setting techniques.   Regarding patient's less desirable eating habits and patterns, we employed the technique of small changes.   Pt will specifically work on: n/a   Recommended Physical Activity Goals [name] has been advised to work up to 150 minutes of moderate intensity aerobic activity a week and strengthening exercises 2-3 times per week for cardiovascular health, weight loss maintenance and preservation of muscle mass.   She has agreed to : Continue current level of physical activity    Pharmacotherapy We both agreed to : continue with  nutritional and behavioral strategies and continue current anti-obesity medication regimen   FOR ASSOCIATED CONDITIONS ADDRESSED TODAY:  Vitamin D deficiency Assessment & Plan: Pt [facility]. Pt states that her [facility] instructed her to hold off on high dose Vitamin D because her levels were too high (100+). I discussed the importance of vitamin D to the patient's health and well-being as well as to their ability to lose weight per pt request.  Continue to hold off on supplementation. Bring in [place] labs next OV. Will recheck levels in 2.5 - 3 months.    Insulin resistance with polyphagia and carb cravings Assessment & Plan: Relevant medication: Mounjaro 5 mg once weekly. She denies gastrointestinal issues. She notes that when she skips doses and then restarts the [facility], she develops headaches for a few days. She reports a waning affect on the Mounjaro 5 mg and would like to go up. Will increase Mounjaro to 7.5 mg once weekly, pt advised not to skip doses. Continue to increase protein intake and decrease simple carbs. Maintain adequate hydration.  Orders:  -     Tirzepatide; Inject 7.5 mg into the skin once a week.  Dispense: 2 mL; Refill: 0   Follow up:   No follow-ups on file. She was informed of the importance of frequent follow up visits to maximize her success with intensive lifestyle modifications for her multiple health conditions.  Subjective:   Chief complaint: Obesity [name] is here to discuss her progress with her obesity treatment plan. She is on the Category 1 Plan + Journaling and states she is following her eating plan approximately 90% of  the time. She states she is ding cardio and weights 60 minutes 3 days per week.  Interval History:  [name] is here for a follow up office visit. Since last OV on 03/09/2023, pt's weight has not changed. Pt had full set of labs with [facility]. She would like to discuss Vitamin D in  particular as her physician told her to hold off on supplementation. Food wise, pt did well with her protein intake but admits to overindulging a bit during the holidays.   Pharmacotherapy for weight loss: She is currently taking  Mounjaro 5 mg once weekly .   Review of Systems:  Pertinent positives were addressed with patient today.  Reviewed by clinician on day of visit: allergies, medications, problem list, medical history, surgical history, family history, social history, and previous encounter notes.  Weight Summary and Biometrics   Weight Lost Since Last Visit: 0  Weight Gained Since Last Visit: 0   Vitals Temp: 98 F (36.7 C) BP: 123/72 Pulse Rate: 62 SpO2: 93 %   Anthropometric Measurements Height: 5\' 7"  (1.702 m) Weight: 217 lb (98.4 kg) BMI (Calculated): 33.98 Weight at Last Visit: 217 lb Weight Lost Since Last Visit: 0 Weight Gained Since Last Visit: 0 Starting Weight: 258 lb Total Weight Loss (lbs): 41 lb (18.6 kg) Peak Weight: 284 lb   Body Composition  Body Fat %: 46.8 % Fat Mass (lbs): 101.8 lbs Muscle Mass (lbs): 110 lbs Total Body Water (lbs): 80.6 lbs Visceral Fat Rating : 14   Other Clinical Data Fasting: No Labs: No Today's Visit #: 27 Starting Date: 04/09/21   Objective:   PHYSICAL EXAM: Blood pressure 123/72, pulse 62, temperature 98 F (36.7 C), height 5\' 7"  (1.702 m), weight 217 lb (98.4 kg), SpO2 93%. Body mass index is 33.99 kg/m.  General: she is overweight, cooperative and in no acute distress. PSYCH: Has normal mood, affect and thought process.   HEENT: EOMI, sclerae are anicteric. Lungs: Normal breathing effort, no conversational dyspnea. Extremities: Moves * 4 Neurologic: A and O * 3, good insight  DIAGNOSTIC DATA REVIEWED: BMET    Component Value Date/Time   NA 142 03/02/2023 1552   NA 142 04/23/2022 1456   K 4.1 03/02/2023 1552   CL 105 03/02/2023 1552   CO2 29 03/02/2023 1552   GLUCOSE 88 03/02/2023 1552    BUN 24 (H) 03/02/2023 1552   BUN 24 04/23/2022 1456   CREATININE 1.23 (H) 03/02/2023 1552   CREATININE 1.17 (H) 02/22/2021 1501   CALCIUM 9.2 03/02/2023 1552   GFRNONAA 45 (L) 03/07/2021 1248   GFRAA 52 (L) 08/04/2018 0921   Lab Results  Component Value Date   HGBA1C 5.2 04/23/2022   HGBA1C 5.6 01/31/2017   Lab Results  Component Value Date   INSULIN 8.3 09/11/2022   INSULIN 17.2 04/09/2021   Lab Results  Component Value Date   TSH 1.06 03/02/2023   CBC    Component Value Date/Time   WBC 2.9 (L) 03/02/2023 1552   RBC 4.20 03/02/2023 1552   HGB 14.2 03/02/2023 1552   HCT 43.1 03/02/2023 1552   PLT 187.0 03/02/2023 1552   MCV 102.7 (H) 03/02/2023 1552   MCH 33.7 02/26/2021 0108   MCHC 33.0 03/02/2023 1552   RDW 14.9 03/02/2023 1552   Iron Studies    Component Value Date/Time   IRON 386 06/23/2017 0000   TIBC 285 06/23/2017 0000   FERRITIN 58 06/23/2017 0000   IRONPCTSAT 26 06/23/2017 0000  Lipid Panel     Component Value Date/Time   CHOL 129 03/02/2023 1552   CHOL 134 04/23/2022 1456   TRIG 60.0 03/02/2023 1552   HDL 45.50 03/02/2023 1552   HDL 55 04/23/2022 1456   CHOLHDL 3 03/02/2023 1552   VLDL 12.0 03/02/2023 1552   LDLCALC 71 03/02/2023 1552   LDLCALC 67 04/23/2022 1456   LDLCALC 68 11/14/2019 1537   LDLDIRECT 321.8 02/27/2012 1202   Hepatic Function Panel     Component Value Date/Time   PROT 7.2 03/02/2023 1552   PROT 7.2 04/23/2022 1456   ALBUMIN 3.8 03/02/2023 1552   ALBUMIN 4.2 04/23/2022 1456   AST 26 03/02/2023 1552   ALT 16 03/02/2023 1552   ALKPHOS 84 03/02/2023 1552   BILITOT 0.6 03/02/2023 1552   BILITOT 0.4 04/23/2022 1456   BILIDIR 0.1 03/02/2023 1552   BILIDIR 0.10 11/03/2016 1155   IBILI 0.2 11/14/2019 1537      Component Value Date/Time   TSH 1.06 03/02/2023 1552   Nutritional Lab Results  Component Value Date   VD25OH 60.7 09/11/2022   VD25OH 51.6 04/23/2022   VD25OH 43.8 12/02/2021    Attestations:   I,  Special Puri, acting as a Stage manager for [employer], DO., have compiled all relevant documentation for today's office visit on behalf of Thomasene Lot, DO, while in the presence of [employer], DO.  Reviewed by clinician on day of visit: allergies, medications, problem list, medical history, surgical history, family history, social history, and previous encounter notes pertinent to patient's obesity diagnosis. I have spent X minutes in the care of the patient today including: preparing to see patient (e.g. review and interpretation of tests, old notes ), obtaining and/or reviewing separately obtained history, performing a medically appropriate examination or evaluation, counseling and educating the patient, ordering medications, test or procedures, documenting clinical information in the electronic or other health care record, and independently interpreting results and communicating results to the patient, family, or caregiver   I have reviewed the above documentation for accuracy and completeness, and I agree with the above. [name], D.O.  The 21st Century Cures Act was signed into law in 2016 which includes the topic of electronic health records.  This provides immediate access to information in MyChart.  This includes consultation notes, operative notes, office notes, lab results and pathology reports.  If you have any questions about what you read please let us know at your next visit so we can discuss your concerns and take corrective action if need be.  We are right here with you.

## 2023-04-20 ENCOUNTER — Telehealth (INDEPENDENT_AMBULATORY_CARE_PROVIDER_SITE_OTHER): Payer: Self-pay | Admitting: *Deleted

## 2023-04-20 NOTE — Telephone Encounter (Signed)
PA SUBMITTED VIA COVERMYMEDS.   Findley Naramore (Key: [id number])  Your demographic data has been sent to [employer] successfully!  [employer] typically takes 5-10 minutes to respond, but it may take a little longer in some cases. You will be notified by email when available. You can also check for an update later by opening this request from your dashboard. Please do not fax or call [employer] to resubmit this request. If you need assistance, please chat with CoverMyMeds or call us at [phone].  If it has been longer than 24 hours, please reach out to [employer].

## 2023-05-08 ENCOUNTER — Ambulatory Visit
Admission: RE | Admit: 2023-05-08 | Discharge: 2023-05-08 | Disposition: A | Discharge status: Home / Self Care | Payer: BC Managed Care – PPO | Source: Ambulatory Visit | Attending: Internal Medicine | Admitting: Internal Medicine

## 2023-05-08 DIAGNOSIS — Z1231 Encounter for screening mammogram for malignant neoplasm of breast: Secondary | ICD-10-CM

## 2023-05-11 ENCOUNTER — Encounter (INDEPENDENT_AMBULATORY_CARE_PROVIDER_SITE_OTHER): Payer: Self-pay | Admitting: Family Medicine

## 2023-05-11 ENCOUNTER — Telehealth (INDEPENDENT_AMBULATORY_CARE_PROVIDER_SITE_OTHER): Payer: BC Managed Care – PPO | Admitting: Family Medicine

## 2023-05-11 VITALS — Ht 67.0 in

## 2023-05-11 DIAGNOSIS — E669 Obesity, unspecified: Secondary | ICD-10-CM | POA: Diagnosis not present

## 2023-05-11 DIAGNOSIS — E559 Vitamin D deficiency, unspecified: Secondary | ICD-10-CM

## 2023-05-11 DIAGNOSIS — E88819 Insulin resistance, unspecified: Secondary | ICD-10-CM

## 2023-05-11 DIAGNOSIS — R632 Polyphagia: Secondary | ICD-10-CM

## 2023-05-11 DIAGNOSIS — Z6834 Body mass index (BMI) 34.0-34.9, adult: Secondary | ICD-10-CM

## 2023-05-11 MED ORDER — TIRZEPATIDE 10 MG/0.5ML ~~LOC~~ SOAJ: 10.0000 mg | SUBCUTANEOUS | 0 refills | Status: DC

## 2023-05-11 NOTE — Progress Notes (Signed)
 [name], D.O.  ABFM, ABOM Specializing in Clinical Bariatric Medicine  Office located at: [address] [place], [place]  [address]   Assessment and Plan:  - A live video visit was scheduled and initiated for today's office visit d/t pt having viral GI bug.  - I connected with current patient today and verified that I am speaking with the correct person using 2 identifiers  Location of the patient: Home  Location of the provider + scribe: Office - No physical exam could be performed with this format, beyond that communicated to [name] by the patient/ family members as noted.   - Additionally members of the medical staff at [facility] discussed the limitations of evaluation and management by telemedicine with the patient and that there may be a monetary charge related to this service, depending on their medical insurance. The patient expressed understanding, and agreed to proceed.   FOR THE DISEASE OF OBESITY: BMI 34.0-34.9,adult Assessment & Plan: Bariatric information was not obtained today.   Recommended Dietary Goals [name] is currently in the action stage of change. As such, her goal is to continue weight management plan.  She has agreed to: continue current plan   Behavioral Intervention We discussed the following today: increasing lean protein intake to established goals, decreasing simple carbohydrates , increasing water intake , keeping healthy foods at home, and continue to work on maintaining a reduced calorie state, getting the recommended amount of protein, incorporating whole foods, making healthy choices, staying well hydrated and practicing mindfulness when eating.  Additional resources provided today: None  Evidence-based interventions for health behavior change were utilized today including the discussion of self monitoring techniques, problem-solving barriers and SMART goal setting techniques.   Regarding patient's less desirable eating  habits and patterns, we employed the technique of small changes.   Pt will specifically work on: n/a   Recommended Physical Activity Goals [name] has been advised to work up to 150 minutes of moderate intensity aerobic activity a week and strengthening exercises 2-3 times per week for cardiovascular health, weight loss maintenance and preservation of muscle mass.   She has agreed to :  Continue current level of physical activity  and Think about enjoyable ways to increase daily physical activity and overcoming barriers to exercise   Pharmacotherapy We both agreed to : continue with nutritional and behavioral strategies and increase Mounjaro to 10 mg once weekly   FOR ASSOCIATED CONDITIONS ADDRESSED TODAY:  Insulin resistance with polyphagia and carb cravings Assessment & Plan: Lab Results  Component Value Date   HGBA1C 5.2 04/23/2022   HGBA1C 5.4 12/02/2021   HGBA1C 5.2 02/25/2021   INSULIN 8.3 09/11/2022   INSULIN 16.5 04/23/2022   INSULIN 8.0 12/02/2021    [name] is currently managing condition with Mounjaro 7.5 mg weekly, taking on Fridays. She denies any GI upset, however reports associated headaches occasionally. Mounjaro was increased from 5 mg to 7.5 mg at her last OV. Pt states she has not noticed improvements with her cravings (fruit/carbs) or food noise. Hunger is well controlled. Of note, pt has viral GI bug today.  Given pt states her food noise can be better controlled and has an increase in cravings we will increase her Mounjaro to 10 mg once weekly today. Discussed possible side effects with Mounjaro increase, pt verbalized understanding. Encouraged pt to adhere to B.R.A.T diet while recovering from GI bug and avoid any spicy, fatty, or fried foods. Will continue to monitor condition.  Orders: - Increase Mounjaro to 10 mg once weekly    Vitamin D deficiency Assessment & Plan: Lab Results  Component Value Date   VD25OH 60.7 09/11/2022   VD25OH 51.6 04/23/2022    VD25OH 43.8 12/02/2021   Vitamin D was 90.6 when last checked at the [place] on 03/19/23. Pt was advised to discontinue vitamin D supplementation by her [facility] provider prior to her last OV on 04/13/23. She reports recently resuming supplementation, stating she was low on energy and "needed energy" back.   Reviewed ideal vitamin D levels of 50-70 with pt. Refill not ordered today. Bring in [place] labs to the clinic or scan them in on MyChart. Will continue to monitor condition and will recheck labs in the next 1-2 months.    Follow up:   Return in about 4 weeks (around 06/08/2023). She was informed of the importance of frequent follow up visits to maximize her success with intensive lifestyle modifications for her multiple health conditions.  Subjective:   Chief complaint: Obesity [name] is here to discuss her progress with her obesity treatment plan. She is on journaling using the Category 1 Plan as a guide and states she is following her eating plan approximately 90% of the time. She states she is going to the gym 60 minutes 3 days per week.  Interval History:  [name] is here for a follow up office visit. Since last OV on 04/13/23, she has been working to follow her meal plan. She reports having a viral GI bug today. Has been trying to prioritize proteins. Endorses cravings for fruits and carbs, but fruits more than anything else. Admits she has not been journaling as consistently.   Pharmacotherapy for weight loss: She is currently taking Mounjaro with prediabetes / insulin resistance as the primary indication with adequate clinical response  and experiencing the following side effects: occasional headaches.  Review of Systems:  Pertinent positives were addressed with patient today.  Reviewed by clinician on day of visit: allergies, medications, problem list, medical history, surgical history, family history, social history, and previous encounter notes.  Weight Summary and Biometrics    Weight Lost Since Last Visit: Video Visit  Weight Gained Since Last Visit: Video Visit   Vitals Temp: 0 F (-17.8 C) (Video Visit) BP: -- (Video Visit) Pulse Rate: 0 (Video Visit) SpO2: 0 % (Video Visit)   Anthropometric Measurements Height: 5\' 7"  (1.702 m) Weight:  (0 kg) (Video Visit) Weight at Last Visit: 217lb Weight Lost Since Last Visit: Video Visit Weight Gained Since Last Visit: Video Visit Starting Weight: 258lb Total Weight Loss (lbs): 41 lb (18.6 kg) Peak Weight: 284lb   Body Composition  Body Fat %: 0 % (Video Visit) Fat Mass (lbs): 0 lbs (Video Visit) Muscle Mass (lbs): 0 lbs (Video Visit) Total Body Water (lbs): 0 lbs (Video Visit) Visceral Fat Rating : 0 (Video Visit)   Other Clinical Data Fasting: No Labs: No Today's Visit #: 28 Starting Date: 04/09/21    Objective:   PHYSICAL EXAM: Height 5\' 7"  (1.702 m). Body mass index is 33.99 kg/m.  General: she is overweight, cooperative and in no acute distress. PSYCH: Has normal mood, affect and thought process.   HEENT: EOMI, sclerae are anicteric. Lungs: Normal breathing effort, no conversational dyspnea. Extremities: Moves * 4 Neurologic: A and O * 3, good insight  DIAGNOSTIC DATA REVIEWED: BMET    Component Value Date/Time   NA 142 03/02/2023 1552   NA 142 04/23/2022 1456   K  4.1 03/02/2023 1552   CL 105 03/02/2023 1552   CO2 29 03/02/2023 1552   GLUCOSE 88 03/02/2023 1552   BUN 24 (H) 03/02/2023 1552   BUN 24 04/23/2022 1456   CREATININE 1.23 (H) 03/02/2023 1552   CREATININE 1.17 (H) 02/22/2021 1501   CALCIUM 9.2 03/02/2023 1552   GFRNONAA 45 (L) 03/07/2021 1248   GFRAA 52 (L) 08/04/2018 0921   Lab Results  Component Value Date   HGBA1C 5.2 04/23/2022   HGBA1C 5.6 01/31/2017   Lab Results  Component Value Date   INSULIN 8.3 09/11/2022   INSULIN 17.2 04/09/2021   Lab Results  Component Value Date   TSH 1.06 03/02/2023   CBC    Component Value Date/Time   WBC 2.9  (L) 03/02/2023 1552   RBC 4.20 03/02/2023 1552   HGB 14.2 03/02/2023 1552   HCT 43.1 03/02/2023 1552   PLT 187.0 03/02/2023 1552   MCV 102.7 (H) 03/02/2023 1552   MCH 33.7 02/26/2021 0108   MCHC 33.0 03/02/2023 1552   RDW 14.9 03/02/2023 1552   Iron Studies    Component Value Date/Time   IRON 386 06/23/2017 0000   TIBC 285 06/23/2017 0000   FERRITIN 58 06/23/2017 0000   IRONPCTSAT 26 06/23/2017 0000   Lipid Panel     Component Value Date/Time   CHOL 129 03/02/2023 1552   CHOL 134 04/23/2022 1456   TRIG 60.0 03/02/2023 1552   HDL 45.50 03/02/2023 1552   HDL 55 04/23/2022 1456   CHOLHDL 3 03/02/2023 1552   VLDL 12.0 03/02/2023 1552   LDLCALC 71 03/02/2023 1552   LDLCALC 67 04/23/2022 1456   LDLCALC 68 11/14/2019 1537   LDLDIRECT 321.8 02/27/2012 1202   Hepatic Function Panel     Component Value Date/Time   PROT 7.2 03/02/2023 1552   PROT 7.2 04/23/2022 1456   ALBUMIN 3.8 03/02/2023 1552   ALBUMIN 4.2 04/23/2022 1456   AST 26 03/02/2023 1552   ALT 16 03/02/2023 1552   ALKPHOS 84 03/02/2023 1552   BILITOT 0.6 03/02/2023 1552   BILITOT 0.4 04/23/2022 1456   BILIDIR 0.1 03/02/2023 1552   BILIDIR 0.10 11/03/2016 1155   IBILI 0.2 11/14/2019 1537      Component Value Date/Time   TSH 1.06 03/02/2023 1552   Nutritional Lab Results  Component Value Date   VD25OH 60.7 09/11/2022   VD25OH 51.6 04/23/2022   VD25OH 43.8 12/02/2021    Attestations:   I, Isabelle Course, acting as a Stage manager for Thomasene Lot, DO., have compiled all relevant documentation for today's office visit on behalf of Thomasene Lot, DO, while in the presence of [employer], DO.  Reviewed by clinician on day of visit: allergies, medications, problem list, medical history, surgical history, family history, social history, and previous encounter notes pertinent to patient's obesity diagnosis.  I have reviewed the above documentation for accuracy and completeness, and I agree with the  above. [name], D.O.  The 21st Century Cures Act was signed into law in 2016 which includes the topic of electronic health records.  This provides immediate access to information in MyChart.  This includes consultation notes, operative notes, office notes, lab results and pathology reports.  If you have any questions about what you read please let us know at your next visit so we can discuss your concerns and take corrective action if need be.  We are right here with you.

## 2023-05-12 ENCOUNTER — Telehealth (INDEPENDENT_AMBULATORY_CARE_PROVIDER_SITE_OTHER): Payer: Self-pay

## 2023-05-12 NOTE — Telephone Encounter (Signed)
Dear [name]:  Request for coverage of Mounjaro (tirzepatide) has been denied. A request for prescription coverage for Mounjaro (tirzepatide) was recently submitted on your behalf. After careful consideration and review of the information sent to [name], this request was not approved.   We understand that this decision may not be what you and your doctor expected. This letter and the enclosed information will explain your options and help you decide what to do next. We reviewed all the supporting information sent to [name] and used your plan's guidelines to make our decision.  Why your request was denied:  Current plan approved criteria allows coverage of Mounjaro if the following requirement is met: - The patient have a diagnosis of type 2 diabetes mellitus Your use of this drug is either unknown or does not meet the requirement. This is based on the information that was provided.  Other options can be discussed at the next visit.

## 2023-05-14 ENCOUNTER — Encounter (INDEPENDENT_AMBULATORY_CARE_PROVIDER_SITE_OTHER): Payer: Self-pay | Admitting: Family Medicine

## 2023-05-14 NOTE — Telephone Encounter (Signed)
Labs have been abstracted and sent to [facility] for scanning.

## 2023-05-15 ENCOUNTER — Other Ambulatory Visit: Payer: Self-pay | Admitting: Internal Medicine

## 2023-05-15 ENCOUNTER — Other Ambulatory Visit (INDEPENDENT_AMBULATORY_CARE_PROVIDER_SITE_OTHER): Payer: BC Managed Care – PPO

## 2023-05-15 ENCOUNTER — Encounter: Payer: Self-pay | Admitting: Physician Assistant

## 2023-05-15 ENCOUNTER — Ambulatory Visit (INDEPENDENT_AMBULATORY_CARE_PROVIDER_SITE_OTHER): Payer: BC Managed Care – PPO | Admitting: Physician Assistant

## 2023-05-15 DIAGNOSIS — M25551 Pain in right hip: Secondary | ICD-10-CM

## 2023-05-15 DIAGNOSIS — R928 Other abnormal and inconclusive findings on diagnostic imaging of breast: Secondary | ICD-10-CM

## 2023-05-15 MED ORDER — HYDROCODONE-ACETAMINOPHEN 5-325 MG PO TABS: 1.0000 | ORAL_TABLET | Freq: Four times a day (QID) | ORAL | 0 refills | Status: DC | PRN

## 2023-05-15 MED ORDER — METHYLPREDNISOLONE 4 MG PO TBPK: ORAL_TABLET | ORAL | 0 refills | Status: DC

## 2023-05-15 NOTE — Progress Notes (Signed)
Office Visit Note   Patient: [name]           Date of Birth: 1956/01/01           MRN: [id number] Visit Date: 05/15/2023              Requested by: Etta Grandchild, MD [address] [place],  [place] [address] PCP: Etta Grandchild, MD   Assessment & Plan: Visit Diagnoses:  1. Pain of right hip     Plan: Pleasant 68 year old woman with a chief complaint of right hip pain.  She denies any fall or injuries.  She says it hurts to walk sit or stand.  She said it is in her right groin and radiates to her right mid thigh.  She has been taking Tylenol without much improvement.  She does have a history of lumbar back issues for which she sees Dr. Alvester Morin.  She says this feels different to her.  Denies any fever or chills.  She has some minimal degenerative changes but certainly her exam is consistent with hip issues.  She also has similar issues on the left.  We talked about trying a steroid Dosepak she is taken steroids in the past without difficulty.  May follow-up with me for reexam in about a week  Follow-Up Instructions: Return in about 1 week (around 05/22/2023).   Orders:  Orders Placed This Encounter  Procedures   XR HIP UNILAT W OR W/O PELVIS 2-3 VIEWS RIGHT   Meds ordered this encounter  Medications   methylPREDNISolone (MEDROL DOSEPAK) 4 MG TBPK tablet    Sig: Take as directed    Dispense:  21 tablet    Refill:  0   HYDROcodone-acetaminophen (NORCO/VICODIN) 5-325 MG tablet    Sig: Take 1 tablet by mouth every 6 (six) hours as needed for moderate pain (pain score 4-6).    Dispense:  30 tablet    Refill:  0      Procedures: No procedures performed   Clinical Data: No additional findings.   Subjective: Chief Complaint  Patient presents with   Right Hip - Pain    HPI pleasant 68 year old woman with a increasing right hip pain without injuries or falls.  She says it hits hurts to walk sit and stand no paresthesias no weakness.  She has been taking  Tylenol.  Review of Systems  All other systems reviewed and are negative.    Objective: Vital Signs: There were no vitals taken for this visit.  Physical Exam Constitutional:      Appearance: Normal appearance.  Pulmonary:     Effort: Pulmonary effort is normal.  Skin:    General: Skin is warm and dry.  Neurological:     General: No focal deficit present.     Mental Status: She is alert and oriented to person, place, and time.    Ortho Exam Examination of her right hip she is neurovascular intact negative straight leg raise compartments are soft and nontender no erythema or cellulitis.  She does have some pain with logrolling of the hip reproduced in the anterior hip into the groin.  Strength is intact Specialty Comments:  EXAM: MRI LUMBAR SPINE WITHOUT CONTRAST   TECHNIQUE: Multiplanar, multisequence MR imaging of the lumbar spine was performed. No intravenous contrast was administered.   COMPARISON:  Lumbar MRI 08/31/2016   FINDINGS: Segmentation:  Normal   Alignment:  Mild retrolisthesis L1-2, L2-3, L3-4.   Vertebrae: Negative for fracture or mass. Discogenic  changes on the left at L5-S1 have progressed in the interval.   Conus medullaris and cauda equina: Conus extends to the L1-2 level. Conus and cauda equina appear normal.   Paraspinal and other soft tissues: Negative for paraspinous mass, adenopathy, or fluid collection.   Disc levels:   L1-2: Mild degenerative change.  Negative for stenosis   L2-3: Mild facet degeneration.  Negative for stenosis   L3-4: Mild to moderate facet degeneration. Mild disc bulging. No significant stenosis.   L4-5: Severe facet degeneration bilaterally with facet joint overgrowth and bilateral facet joint effusions. This is similar to the prior study. There is diffuse bulging of the disc. Mild spinal stenosis and moderate subarticular stenosis similar to the prior study.   L5-S1: Progressive disc degeneration and disc  space narrowing. Sclerotic endplate changes and spurring on the left have progressed since the prior study. There is bilateral facet hypertrophy. There is moderate subarticular stenosis on the left which has progressed in the interval. Left paracentral disc protrusion shows significant interval improvement. There is improved mass effect on left S1 nerve root compared with the prior study.   IMPRESSION: Mild spinal stenosis and moderate subarticular stenosis bilaterally L4-5 unchanged. Severe facet degeneration L4-5   Improvement in left-sided disc protrusion L5-S1 with less mass effect on the S1 nerve root. There is progressive disc degeneration and spurring on the left at L5-S1 with progression of subarticular stenosis on the left due to spurring.     Electronically Signed   By: Marlan Palau M.D.   On: 10/13/2019 15:05  Imaging: XR HIP UNILAT W OR W/O PELVIS 2-3 VIEWS RIGHT Result Date: 05/15/2023 Radiographs of her right hip demonstrate some sclerotic changes no acute fractures    PMFS History: Patient Active Problem List   Diagnosis Date Noted   Need for prophylactic vaccination with combined diphtheria-tetanus-pertussis (DTP) vaccine 03/02/2023   Need for RSV vaccination 03/02/2023   Vitamin B deficiency 06/26/2022   BMI 35.0-35.9,adult 06/26/2022   Drug-induced constipation 06/26/2022   Essential hypertension 06/26/2022   Allergic rhinitis 06/08/2022   Other hyperlipidemia 04/23/2022   Morbid obesity (HCC)-starting bmi 41.64 04/23/2022   BMI 36.0-36.9,adult-current bmi 04/23/2022   Insulin resistance 03/25/2022   LRTI (lower respiratory tract infection) 01/24/2022   Vitamin D deficiency 06/17/2021   Need for shingles vaccine 04/08/2021   Encounter for general adult medical examination with abnormal findings 04/08/2021   Chronic heart failure with preserved ejection fraction (HCC) 04/03/2021   Hyperlipidemia LDL goal <70 04/03/2021   Screen for colon cancer  04/03/2021   Systemic lupus erythematosus (HCC) 02/25/2021   Stage 3a chronic kidney disease (HCC)    Hypertension 08/08/2020   Chronic idiopathic constipation 05/31/2020   Class 3 severe obesity with serious comorbidity and body mass index (BMI) of 40.0 to 44.9 in adult [facility]) 05/31/2020   Postoperative atrial fibrillation (HCC) 02/18/2017   Pain in right hip 07/21/2016   Osteoarthritis of spine with radiculopathy, lumbar region 06/24/2016   Hypothyroidism 04/08/2011   Coronary artery disease involving coronary bypass graft of native heart with angina pectoris (HCC) 02/26/2009   Gastroesophageal reflux disease 08/05/2007   Systemic lupus erythematosus with focal and segmental proliferative glomerulonephritis (HCC) 08/05/2007   Past Medical History:  Diagnosis Date   Back pain    Bilateral swelling of feet and ankles    CAD (coronary artery disease)    a. remote stents/angioplasty. b. s/p CABG 01/2017 with mitral valve annuloplasty.   Chest pain    Constipation  Diastolic heart failure (HCC)    Esophageal reflux    Gallbladder problem    GERD (gastroesophageal reflux disease)    History of MI (myocardial infarction)    Hyperlipidemia    Hypertension    Hypothyroidism    Immune thrombocytopenic purpura (HCC)    Joint pain    Kidney problem    Lupus erythematosus    Mitral valve insufficiency and aortic valve insufficiency    Myocardial infarction (HCC)    x 2   Other fatigue    PONV (postoperative nausea and vomiting)    Postoperative atrial fibrillation (HCC)    Renal disease    Severe mitral regurgitation    a. s/p MV annuloplasty 01/2017 at time of CABG.   Shortness of breath on exertion    Sleep apnea    Status post mitral valve annuloplasty    Unspecified disease of pericardium     Family History  Problem Relation Age of Onset   Heart disease Mother    Hyperlipidemia Mother    Cancer Mother        breast and cervical   Paget's disease of bone Mother     Fibromyalgia Mother    Breast cancer Mother    Hypertension Father    Heart disease Father        PVD   GER disease Father    Breast cancer Sister    Heart attack Maternal Grandmother    Cancer Paternal Grandfather        colon    Past Surgical History:  Procedure Laterality Date   CHOLECYSTECTOMY, LAPAROSCOPIC  2010   CORONARY ARTERY BYPASS GRAFT N/A 01/30/2017   Procedure: CORONARY ARTERY BYPASS GRAFTING (CABG), Times four  using the right saphaneous vein, harvested endoscopicly.  and left internal mammary artery .;  Surgeon: Alleen Borne, MD;  Location: [facility] OR;  Service: Open Heart Surgery;  Laterality: N/A;   LEFT HEART CATH AND CORONARY ANGIOGRAPHY N/A 01/23/2017   Procedure: LEFT HEART CATH AND CORONARY ANGIOGRAPHY;  Surgeon: Lyn Records, MD;  Location: [facility] INVASIVE CV LAB;  Service: Cardiovascular;  Laterality: N/A;   MITRAL VALVE REPAIR N/A 01/30/2017   Procedure: MITRAL VALVE REPAIR (MVR);  Surgeon: Alleen Borne, MD;  Location: [facility] OR;  Service: Open Heart Surgery;  Laterality: N/A;   plastic surgical repair      dog bite to leg   RIGHT/LEFT HEART CATH AND CORONARY/GRAFT ANGIOGRAPHY N/A 02/26/2021   Procedure: RIGHT/LEFT HEART CATH AND CORONARY/GRAFT ANGIOGRAPHY;  Surgeon: Lyn Records, MD;  Location: [facility] INVASIVE CV LAB;  Service: Cardiovascular;  Laterality: N/A;   SPLENECTOMY  5-04   TEE WITHOUT CARDIOVERSION N/A 01/27/2017   Procedure: TRANSESOPHAGEAL ECHOCARDIOGRAM (TEE);  Surgeon: Thurmon Fair, MD;  Location: [facility] ENDOSCOPY;  Service: Cardiovascular;  Laterality: N/A;   TEE WITHOUT CARDIOVERSION N/A 01/30/2017   Procedure: TRANSESOPHAGEAL ECHOCARDIOGRAM (TEE);  Surgeon: Alleen Borne, MD;  Location: [facility] OR;  Service: Open Heart Surgery;  Laterality: N/A;   ULTRASOUND GUIDANCE FOR VASCULAR ACCESS  01/23/2017   Procedure: Ultrasound Guidance For Vascular Access;  Surgeon: Lyn Records, MD;  Location: [facility] INVASIVE CV LAB;  Service: Cardiovascular;;   Social History    Occupational History   OccupationPassenger transport manager    Employer: [employer]   Tobacco Use   Smoking status: Former    Current packs/day: 0.00    Average packs/day: 1 pack/day for 30.0 years (30.0 ttl pk-yrs)    Types: Cigarettes  Start date: 03/23/1986    Quit date: 12/23/2015    Years since quitting: 7.3   Smokeless tobacco: Never  Vaping Use   Vaping status: Never Used  Substance and Sexual Activity   Alcohol use: No   Drug use: No   Sexual activity: Not Currently    Comment: 1st intercourse 68 yo-More than 5 partners

## 2023-05-22 ENCOUNTER — Encounter: Payer: Self-pay | Admitting: Physician Assistant

## 2023-05-22 ENCOUNTER — Ambulatory Visit: Payer: BC Managed Care – PPO | Admitting: Physician Assistant

## 2023-05-22 DIAGNOSIS — M25551 Pain in right hip: Secondary | ICD-10-CM

## 2023-05-22 NOTE — Progress Notes (Signed)
 Office Visit Note   Patient: [name]           Date of Birth: [birth year]           MRN: [id number] Visit Date: 05/22/2023              Requested by: Etta Grandchild, MD [address] [place],  [place] [address] PCP: Etta Grandchild, MD  Chief Complaint  Patient presents with   Right Hip - Pain      HPI: Patient is a pleasant 68 year old woman who I treated about a week ago who had onset of right hip pain.  She denied any particular injury pain seem to be consistent in the groin and ran down about halfway down her anterior thigh.  She denied any paresthesias.  She does have a history of back issues but felt this was separate.  She did take a Medrol Dosepak and does feel a lot better.  That being said she still has some pain she would like to get an MRI of the hip  Assessment & Plan: Visit Diagnoses:  1. Pain of right hip   2. Pain in right hip     Plan: Right hip pain.  Some of this still could be coming from her back that she thinks it is much different than her back.  She would like an MRI and follow-up with Dr. Magnus Ivan per her request  Ortho Exam  Patient is alert, oriented, no adenopathy, well-dressed, normal affect, normal respiratory effort. Right hip she has much more fluid range of motion still has some pain in the groin but has significantly decreased she is neurovascular intact negative straight leg raise she does have some mild pain in the posterior buttock strength is intact  Imaging: No results found. No images are attached to the encounter.  Labs: Lab Results  Component Value Date   HGBA1C 5.3 03/19/2023   HGBA1C 5.2 04/23/2022   HGBA1C 5.4 12/02/2021   ESRSEDRATE 90 (H) 03/07/2012   ESRSEDRATE 40 (H) 04/22/2011   REPTSTATUS 03/10/2016 FINAL 03/09/2016   CULT MULTIPLE SPECIES PRESENT, SUGGEST RECOLLECTION (A) 03/09/2016   LABORGA ESCHERICHIA COLI 05/19/2009     Lab Results  Component Value Date   ALBUMIN 4.3 03/19/2023   ALBUMIN 3.8  03/02/2023   ALBUMIN 4.2 04/23/2022    Lab Results  Component Value Date   MG 2.2 02/27/2021   MG 2.1 02/26/2021   MG 2.1 02/25/2021   Lab Results  Component Value Date   VD25OH 90.6 03/19/2023   VD25OH 60.7 09/11/2022   VD25OH 51.6 04/23/2022    No results found for: "PREALBUMIN"    Latest Ref Rng & Units 03/19/2023   12:00 AM 03/02/2023    3:52 PM 06/19/2021   12:00 AM  CBC EXTENDED  WBC  4.0     2.9  3.3      RBC 3.87 - 5.11 4.43     4.20  4.23      Hemoglobin 12.0 - 16.0 15.1     14.2  13.9      HCT 36 - 46 43     43.1  41      Platelets 150 - 400 K/uL 208     187.0  189      NEUT# 1.4 - 7.7 K/uL  1.6    Lymph# 0.7 - 4.0 K/uL  0.6       This result is from an external source.  There is no height or weight on file to calculate BMI.  Orders:  Orders Placed This Encounter  Procedures   MR Hip Right w/o contrast   Ambulatory referral to Orthopedic Surgery   No orders of the defined types were placed in this encounter.    Procedures: No procedures performed  Clinical Data: No additional findings.  ROS:  All other systems negative, except as noted in the HPI. Review of Systems  Objective: Vital Signs: There were no vitals taken for this visit.  Specialty Comments:  EXAM: MRI LUMBAR SPINE WITHOUT CONTRAST   TECHNIQUE: Multiplanar, multisequence MR imaging of the lumbar spine was performed. No intravenous contrast was administered.   COMPARISON:  Lumbar MRI 08/31/2016   FINDINGS: Segmentation:  Normal   Alignment:  Mild retrolisthesis L1-2, L2-3, L3-4.   Vertebrae: Negative for fracture or mass. Discogenic changes on the left at L5-S1 have progressed in the interval.   Conus medullaris and cauda equina: Conus extends to the L1-2 level. Conus and cauda equina appear normal.   Paraspinal and other soft tissues: Negative for paraspinous mass, adenopathy, or fluid collection.   Disc levels:   L1-2: Mild degenerative change.  Negative for  stenosis   L2-3: Mild facet degeneration.  Negative for stenosis   L3-4: Mild to moderate facet degeneration. Mild disc bulging. No significant stenosis.   L4-5: Severe facet degeneration bilaterally with facet joint overgrowth and bilateral facet joint effusions. This is similar to the prior study. There is diffuse bulging of the disc. Mild spinal stenosis and moderate subarticular stenosis similar to the prior study.   L5-S1: Progressive disc degeneration and disc space narrowing. Sclerotic endplate changes and spurring on the left have progressed since the prior study. There is bilateral facet hypertrophy. There is moderate subarticular stenosis on the left which has progressed in the interval. Left paracentral disc protrusion shows significant interval improvement. There is improved mass effect on left S1 nerve root compared with the prior study.   IMPRESSION: Mild spinal stenosis and moderate subarticular stenosis bilaterally L4-5 unchanged. Severe facet degeneration L4-5   Improvement in left-sided disc protrusion L5-S1 with less mass effect on the S1 nerve root. There is progressive disc degeneration and spurring on the left at L5-S1 with progression of subarticular stenosis on the left due to spurring.     Electronically Signed   By: Marlan Palau M.D.   On: 10/13/2019 15:05  PMFS History: Patient Active Problem List   Diagnosis Date Noted   Need for prophylactic vaccination with combined diphtheria-tetanus-pertussis (DTP) vaccine 03/02/2023   Need for RSV vaccination 03/02/2023   Vitamin B deficiency 06/26/2022   BMI 35.0-35.9,adult 06/26/2022   Drug-induced constipation 06/26/2022   Essential hypertension 06/26/2022   Allergic rhinitis 06/08/2022   Other hyperlipidemia 04/23/2022   Morbid obesity (HCC)-starting bmi 41.64 04/23/2022   BMI 36.0-36.9,adult-current bmi 04/23/2022   Insulin resistance 03/25/2022   LRTI (lower respiratory tract infection)  01/24/2022   Vitamin D deficiency 06/17/2021   Need for shingles vaccine 04/08/2021   Encounter for general adult medical examination with abnormal findings 04/08/2021   Chronic heart failure with preserved ejection fraction (HCC) 04/03/2021   Hyperlipidemia LDL goal <70 04/03/2021   Screen for colon cancer 04/03/2021   Systemic lupus erythematosus (HCC) 02/25/2021   Stage 3a chronic kidney disease (HCC)    Hypertension 08/08/2020   Chronic idiopathic constipation 05/31/2020   Class 3 severe obesity with serious comorbidity and body mass index (BMI) of 40.0 to 44.9 in adult (  HCC) 05/31/2020   Postoperative atrial fibrillation (HCC) 02/18/2017   Pain in right hip 07/21/2016   Osteoarthritis of spine with radiculopathy, lumbar region 06/24/2016   Hypothyroidism 04/08/2011   Coronary artery disease involving coronary bypass graft of native heart with angina pectoris (HCC) 02/26/2009   Gastroesophageal reflux disease 08/05/2007   Systemic lupus erythematosus with focal and segmental proliferative glomerulonephritis (HCC) 08/05/2007   Past Medical History:  Diagnosis Date   Back pain    Bilateral swelling of feet and ankles    CAD (coronary artery disease)    a. remote stents/angioplasty. b. s/p CABG 01/2017 with mitral valve annuloplasty.   Chest pain    Constipation    Diastolic heart failure (HCC)    Esophageal reflux    Gallbladder problem    GERD (gastroesophageal reflux disease)    History of MI (myocardial infarction)    Hyperlipidemia    Hypertension    Hypothyroidism    Immune thrombocytopenic purpura (HCC)    Joint pain    Kidney problem    Lupus erythematosus    Mitral valve insufficiency and aortic valve insufficiency    Myocardial infarction (HCC)    x 2   Other fatigue    PONV (postoperative nausea and vomiting)    Postoperative atrial fibrillation (HCC)    Renal disease    Severe mitral regurgitation    a. s/p MV annuloplasty 01/2017 at time of CABG.    Shortness of breath on exertion    Sleep apnea    Status post mitral valve annuloplasty    Unspecified disease of pericardium     Family History  Problem Relation Age of Onset   Heart disease Mother    Hyperlipidemia Mother    Cancer Mother        breast and cervical   Paget's disease of bone Mother    Fibromyalgia Mother    Breast cancer Mother    Hypertension Father    Heart disease Father        PVD   GER disease Father    Breast cancer Sister    Heart attack Maternal Grandmother    Cancer Paternal Grandfather        colon    Past Surgical History:  Procedure Laterality Date   CHOLECYSTECTOMY, LAPAROSCOPIC  2010   CORONARY ARTERY BYPASS GRAFT N/A 01/30/2017   Procedure: CORONARY ARTERY BYPASS GRAFTING (CABG), Times four  using the right saphaneous vein, harvested endoscopicly.  and left internal mammary artery .;  Surgeon: Alleen Borne, MD;  Location: [facility] OR;  Service: Open Heart Surgery;  Laterality: N/A;   LEFT HEART CATH AND CORONARY ANGIOGRAPHY N/A 01/23/2017   Procedure: LEFT HEART CATH AND CORONARY ANGIOGRAPHY;  Surgeon: Lyn Records, MD;  Location: [facility] INVASIVE CV LAB;  Service: Cardiovascular;  Laterality: N/A;   MITRAL VALVE REPAIR N/A 01/30/2017   Procedure: MITRAL VALVE REPAIR (MVR);  Surgeon: Alleen Borne, MD;  Location: [facility] OR;  Service: Open Heart Surgery;  Laterality: N/A;   plastic surgical repair      dog bite to leg   RIGHT/LEFT HEART CATH AND CORONARY/GRAFT ANGIOGRAPHY N/A 02/26/2021   Procedure: RIGHT/LEFT HEART CATH AND CORONARY/GRAFT ANGIOGRAPHY;  Surgeon: Lyn Records, MD;  Location: [facility] INVASIVE CV LAB;  Service: Cardiovascular;  Laterality: N/A;   SPLENECTOMY  5-04   TEE WITHOUT CARDIOVERSION N/A 01/27/2017   Procedure: TRANSESOPHAGEAL ECHOCARDIOGRAM (TEE);  Surgeon: Thurmon Fair, MD;  Location: [facility] ENDOSCOPY;  Service: Cardiovascular;  Laterality: N/A;  TEE WITHOUT CARDIOVERSION N/A 01/30/2017   Procedure: TRANSESOPHAGEAL ECHOCARDIOGRAM (TEE);   Surgeon: Alleen Borne, MD;  Location: [facility] OR;  Service: Open Heart Surgery;  Laterality: N/A;   ULTRASOUND GUIDANCE FOR VASCULAR ACCESS  01/23/2017   Procedure: Ultrasound Guidance For Vascular Access;  Surgeon: Lyn Records, MD;  Location: [facility] INVASIVE CV LAB;  Service: Cardiovascular;;   Social History   Occupational History   OccupationPassenger transport manager    Employer: [employer]   Tobacco Use   Smoking status: Former    Current packs/day: 0.00    Average packs/day: 1 pack/day for 30.0 years (30.0 ttl pk-yrs)    Types: Cigarettes    Start date: 03/23/1986    Quit date: 12/23/2015    Years since quitting: 7.4   Smokeless tobacco: Never  Vaping Use   Vaping status: Never Used  Substance and Sexual Activity   Alcohol use: No   Drug use: No   Sexual activity: Not Currently    Comment: 1st intercourse 68 yo-More than 5 partners

## 2023-05-26 ENCOUNTER — Ambulatory Visit
Admission: RE | Admit: 2023-05-26 | Discharge: 2023-05-26 | Disposition: A | Discharge status: Home / Self Care | Payer: BC Managed Care – PPO | Source: Ambulatory Visit | Attending: Internal Medicine | Admitting: Internal Medicine

## 2023-05-26 DIAGNOSIS — R928 Other abnormal and inconclusive findings on diagnostic imaging of breast: Secondary | ICD-10-CM

## 2023-05-28 ENCOUNTER — Other Ambulatory Visit: Payer: Self-pay | Admitting: Internal Medicine

## 2023-05-28 ENCOUNTER — Encounter: Payer: Self-pay | Admitting: Internal Medicine

## 2023-05-28 DIAGNOSIS — R921 Mammographic calcification found on diagnostic imaging of breast: Secondary | ICD-10-CM

## 2023-06-01 ENCOUNTER — Encounter: Payer: Self-pay | Admitting: Physician Assistant

## 2023-06-03 ENCOUNTER — Ambulatory Visit
Admission: RE | Admit: 2023-06-03 | Discharge: 2023-06-03 | Disposition: A | Discharge status: Home / Self Care | Source: Ambulatory Visit | Attending: Internal Medicine | Admitting: Internal Medicine

## 2023-06-03 DIAGNOSIS — R921 Mammographic calcification found on diagnostic imaging of breast: Secondary | ICD-10-CM | POA: Diagnosis not present

## 2023-06-03 HISTORY — PX: BREAST BIOPSY: SHX20

## 2023-06-04 LAB — SURGICAL PATHOLOGY

## 2023-06-07 ENCOUNTER — Ambulatory Visit
Admission: RE | Admit: 2023-06-07 | Discharge: 2023-06-07 | Disposition: A | Discharge status: Home / Self Care | Payer: BC Managed Care – PPO | Source: Ambulatory Visit | Attending: Physician Assistant | Admitting: Physician Assistant

## 2023-06-07 DIAGNOSIS — M25551 Pain in right hip: Secondary | ICD-10-CM

## 2023-06-16 ENCOUNTER — Ambulatory Visit (INDEPENDENT_AMBULATORY_CARE_PROVIDER_SITE_OTHER): Payer: Medicare Other

## 2023-06-16 VITALS — Ht 67.0 in | Wt 217.0 lb

## 2023-06-16 DIAGNOSIS — Z87891 Personal history of nicotine dependence: Secondary | ICD-10-CM

## 2023-06-16 DIAGNOSIS — Z0000 Encounter for general adult medical examination without abnormal findings: Secondary | ICD-10-CM | POA: Diagnosis not present

## 2023-06-16 NOTE — Progress Notes (Signed)
 Subjective:   [name] is a 68 y.o. who presents for a Medicare Wellness preventive visit.  Visit Complete: Virtual I connected with  [name] on 06/16/23 by a audio enabled telemedicine application and verified that I am speaking with the correct person using two identifiers.  Patient Location: Home  Provider Location: Office/Clinic  I discussed the limitations of evaluation and management by telemedicine. The patient expressed understanding and agreed to proceed.  Vital Signs: Because this visit was a virtual/telehealth visit, some criteria may be missing or patient reported. Any vitals not documented were not able to be obtained and vitals that have been documented are patient reported.  VideoDeclined- This patient declined Librarian, academic. Therefore the visit was completed with audio only.  Persons Participating in Visit: Patient.  AWV Questionnaire: No: Patient Medicare AWV questionnaire was not completed prior to this visit.  Cardiac Risk Factors include: advanced age (>3men, >30 women);dyslipidemia;hypertension;obesity (BMI >30kg/m2)     Objective:    Today's Vitals   06/16/23 1516  Weight: 217 lb (98.4 kg)  Height: 5\' 7"  (1.702 m)   Body mass index is 33.99 kg/m.     06/16/2023    3:14 PM 06/12/2022    3:04 PM 08/03/2021    5:31 PM 06/10/2021    9:28 AM 04/04/2021    5:57 PM 02/25/2021    5:00 PM 06/08/2020    3:58 PM  Advanced Directives  Does Patient Have a Medical Advance Directive? No Yes No Yes Yes No Yes  Type of Special educational needs teacher of [place];Living will  Living will;Healthcare Power of [employer] Power of [place];Living will    Does patient want to make changes to medical advance directive?    No - Patient declined   No - Patient declined  Copy of Healthcare Power of Attorney in Chart?  No - copy requested  No - copy requested     Would patient like information on creating a  medical advance directive? Yes (MAU/Ambulatory/Procedural Areas - Information given)     No - Patient declined     Current Medications (verified) Outpatient Encounter Medications as of 06/16/2023  Medication Sig   albuterol (VENTOLIN HFA) 108 (90 Base) MCG/ACT inhaler Inhale 2 puffs into the lungs every 6 (six) hours as needed for wheezing or shortness of breath.   aspirin 81 MG chewable tablet Chew 1 tablet (81 mg total) by mouth daily.   azaTHIOprine (IMURAN) 50 MG tablet TAKE 1 TABLET BY MOUTH IN THE MORNING AND IN THE EVENING   cyclobenzaprine (FLEXERIL) 5 MG tablet Take 1 tablet (5 mg total) by mouth 3 (three) times daily as needed.   dapagliflozin propanediol (FARXIGA) 10 MG TABS tablet Take 1 tablet (10 mg total) by mouth daily.   fluticasone (FLONASE) 50 MCG/ACT nasal spray SPRAY 2 SPRAYS INTO EACH NOSTRIL EVERY DAY (Patient taking differently: Place 2 sprays into both nostrils daily as needed for allergies or rhinitis.)   furosemide (LASIX) 20 MG tablet TAKE 1 TABLET BY MOUTH EVERY DAY   HYDROcodone-acetaminophen (NORCO/VICODIN) 5-325 MG tablet Take 1 tablet by mouth every 6 (six) hours as needed for moderate pain (pain score 4-6).   hydroxychloroquine (PLAQUENIL) 200 MG tablet Take 200 mg by mouth daily.    isosorbide mononitrate (IMDUR) 30 MG 24 hr tablet Take 1 tablet (30 mg total) by mouth daily.   levothyroxine (SYNTHROID) 75 MCG tablet Take 1 tablet (75 mcg total) by mouth daily before breakfast.  LINZESS 290 MCG CAPS capsule Take 290 mcg by mouth daily. Patient takes as needed   metoprolol succinate (TOPROL-XL) 50 MG 24 hr tablet Take 1 tablet (50 mg total) by mouth daily.   nitroGLYCERIN (NITROSTAT) 0.4 MG SL tablet Place 1 tablet (0.4 mg total) under the tongue every 5 (five) minutes as needed for chest pain.   pantoprazole (PROTONIX) 40 MG tablet Take 1 tablet (40 mg total) by mouth daily.   polyethylene glycol powder (GLYCOLAX/MIRALAX) 17 GM/SCOOP powder 17 gram po BID prn  constipation   potassium chloride SA (KLOR-CON M) 20 MEQ tablet Take 1 tablet (20 mEq total) by mouth daily.   pregabalin (LYRICA) 50 MG capsule Take 50 mg by mouth 2 (two) times daily. Patient taking as needed   rosuvastatin (CRESTOR) 20 MG tablet Take 1 tablet (20 mg total) by mouth daily.   tirzepatide (MOUNJARO) 10 MG/0.5ML Pen Inject 10 mg into the skin once a week.   [DISCONTINUED] methylPREDNISolone (MEDROL DOSEPAK) 4 MG TBPK tablet Take as directed   No facility-administered encounter medications on file as of 06/16/2023.    Allergies (verified) Amlodipine and Cellcept [mycophenolate]   History: Past Medical History:  Diagnosis Date   Back pain    Bilateral swelling of feet and ankles    CAD (coronary artery disease)    a. remote stents/angioplasty. b. s/p CABG 01/2017 with mitral valve annuloplasty.   Chest pain    Constipation    Diastolic heart failure (HCC)    Esophageal reflux    Gallbladder problem    GERD (gastroesophageal reflux disease)    History of MI (myocardial infarction)    Hyperlipidemia    Hypertension    Hypothyroidism    Immune thrombocytopenic purpura (HCC)    Joint pain    Kidney problem    Lupus erythematosus    Mitral valve insufficiency and aortic valve insufficiency    Myocardial infarction (HCC)    x 2   Other fatigue    PONV (postoperative nausea and vomiting)    Postoperative atrial fibrillation (HCC)    Renal disease    Severe mitral regurgitation    a. s/p MV annuloplasty 01/2017 at time of CABG.   Shortness of breath on exertion    Sleep apnea    Status post mitral valve annuloplasty    Unspecified disease of pericardium    Past Surgical History:  Procedure Laterality Date   BREAST BIOPSY Right 06/03/2023   MM RT BREAST BX W LOC DEV 1ST LESION IMAGE BX SPEC STEREO GUIDE 06/03/2023 [facility] M[facility]MO[facility]HY   CHOLECYSTECTOMY, LAPAROSCOPIC  2010   CORONARY ARTERY BYPASS GRAFT N/A 01/30/2017   Procedure: CORONARY ARTERY BYPASS GRAFTING  (CABG), Times four  using the right saphaneous vein, harvested endoscopicly.  and left internal mammary artery .;  Surgeon: Alleen Borne, MD;  Location: [facility] OR;  Service: Open Heart Surgery;  Laterality: N/A;   LEFT HEART CATH AND CORONARY ANGIOGRAPHY N/A 01/23/2017   Procedure: LEFT HEART CATH AND CORONARY ANGIOGRAPHY;  Surgeon: Lyn Records, MD;  Location: [facility] INVASIVE CV LAB;  Service: Cardiovascular;  Laterality: N/A;   MITRAL VALVE REPAIR N/A 01/30/2017   Procedure: MITRAL VALVE REPAIR (MVR);  Surgeon: Alleen Borne, MD;  Location: [facility] OR;  Service: Open Heart Surgery;  Laterality: N/A;   plastic surgical repair      dog bite to leg   RIGHT/LEFT HEART CATH AND CORONARY/GRAFT ANGIOGRAPHY N/A 02/26/2021   Procedure: RIGHT/LEFT HEART CATH AND CORONARY/GRAFT ANGIOGRAPHY;  Surgeon:  Lyn Records, MD;  Location: [facility] INVASIVE CV LAB;  Service: Cardiovascular;  Laterality: N/A;   SPLENECTOMY  5-04   TEE WITHOUT CARDIOVERSION N/A 01/27/2017   Procedure: TRANSESOPHAGEAL ECHOCARDIOGRAM (TEE);  Surgeon: Thurmon Fair, MD;  Location: [facility] ENDOSCOPY;  Service: Cardiovascular;  Laterality: N/A;   TEE WITHOUT CARDIOVERSION N/A 01/30/2017   Procedure: TRANSESOPHAGEAL ECHOCARDIOGRAM (TEE);  Surgeon: Alleen Borne, MD;  Location[facility] OR;  Service: Open Heart Surgery;  Laterality: N/A;   ULTRASOUND GUIDANCE FOR VASCULAR ACCESS  01/23/2017   Procedure: Ultrasound Guidance For Vascular Access;  Surgeon: Lyn Records, MD;  Location[facility] INVASIVE CV LAB;  Service: Cardiovascular;;   Family History  Problem Relation Age of Onset   Heart disease Mother    Hyperlipidemia Mother    Cancer Mother        breast and cervical   Paget's disease of bone Mother    Fibromyalgia Mother    Breast cancer Mother    Hypertension Father    Heart disease Father        PVD   GER disease Father    Breast cancer Sister    Heart attack Maternal Grandmother    Cancer Paternal Grandfather        colon   Social History    Socioeconomic History   Marital status: Divorced    Spouse name: Not on file   Number of children: 2   Years of education: Not on file   Highest education level: Master's degree (e.g., MA, MS, MEng, MEd, MSW, MBA)  Occupational History   OccupationPassenger transport manager    Employer: [employer]   Tobacco Use   Smoking status: Former    Current packs/day: 0.00    Average packs/day: 1 pack/day for 30.0 years (30.0 ttl pk-yrs)    Types: Cigarettes    Start date: 03/23/1986    Quit date: 12/23/2015    Years since quitting: 7.4    Passive exposure: Past   Smokeless tobacco: Never  Vaping Use   Vaping status: Never Used  Substance and Sexual Activity   Alcohol use: No   Drug use: No   Sexual activity: Not Currently    Comment: 1st intercourse 68 yo-More than 5 partners  Other Topics Concern   Not on file  Social History Narrative   HSG,  graduated from [employer] in [place]. In college [name] -grad '13 with relgious studies. [employer] - Fall '13 for MA-divinity. Marrried '73 - 2 years/ divorced. 2 son- '73, '75 - CP.    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    Social Drivers of Corporate investment banker Strain: Low Risk  (06/16/2023)   Overall Financial Resource Strain (CARDIA)    Difficulty of Paying Living Expenses: Not hard at all  Food Insecurity: No Food Insecurity (06/16/2023)   Hunger Vital Sign    Worried About Running Out of Food in the Last Year: Never true    Ran Out of Food in the Last Year: Never true  Transportation Needs: No Transportation Needs (06/16/2023)   PRAPARE - Administrator, Civil Service (Medical): No    Lack of Transportation (Non-Medical): No  Physical Activity: Sufficiently Active (06/16/2023)   Exercise Vital Sign    Days of Exercise per Week: 3 days    Minutes of Exercise per Session: 60 min   Stress:  No Stress Concern Present (06/16/2023)   [employer] of Occupational Health - Occupational Stress Questionnaire    Feeling of Stress : Not at all  Social Connections: Moderately Integrated (06/16/2023)   Social Connection and Isolation Panel [NHANES]    Frequency of Communication with Friends and Family: More than three times a week    Frequency of Social Gatherings with Friends and Family: More than three times a week    Attends Religious Services: More than 4 times per year    Active Member of [employer] or Organizations: Yes    Attends Engineer, structural: More than 4 times per year    Marital Status: Divorced    Tobacco Counseling Counseling given: No    Clinical Intake:  Pre-visit preparation completed: Yes  Pain : No/denies pain     BMI - recorded: 33.99 Nutritional Risks: None Diabetes: No  Lab Results  Component Value Date   HGBA1C 5.3 03/19/2023   HGBA1C 5.2 04/23/2022   HGBA1C 5.4 12/02/2021     How often do you need to have someone help you when you read instructions, pamphlets, or other written materials from your doctor or pharmacy?: 1 - Never  Interpreter Needed?: No  Information entered by :: Hassell Halim, CMA   Activities of Daily Living     06/16/2023    3:19 PM  In your present state of health, do you have any difficulty performing the following activities:  Hearing? 0  Vision? 0  Difficulty concentrating or making decisions? 0  Walking or climbing stairs? 0  Dressing or bathing? 0  Doing errands, shopping? 0  Preparing Food and eating ? N  Using the Toilet? N  In the past six months, have you accidently leaked urine? N  Do you have problems with loss of bowel control? N  Managing your Medications? N  Managing your Finances? N  Housekeeping or managing your Housekeeping? N    Patient Care Team: Etta Grandchild, MD as PCP - General (Internal Medicine) Thomasene Ripple, DO as PCP - Cardiology (Cardiology) Elvis Coil,  MD (Nephrology) Zenovia Jordan, MD (Rheumatology) Andrena Mews, DO as Referring Physician (Sports Medicine) Gelene Mink, OD as Referring Physician (Optometry)  Indicate any recent Medical Services you may have received from other than [facility] providers in the past year (date may be approximate).     Assessment:   This is a routine wellness examination for Anwita.  Hearing/Vision screen Hearing Screening - Comments:: Denies hearing difficulties   Vision Screening - Comments:: Wears rx glasses - up to date with routine eye exams with Dr Elliot Cousin   Goals Addressed               This Visit's Progress     Weight (lb) < 200 lb (90.7 kg) (pt-stated)   217 lb (98.4 kg)     Patient stated she wants to lose weight (about 20lbs).  Continue exercising.       Depression Screen     06/16/2023    3:21 PM 06/12/2022    3:07 PM 07/08/2021    9:43 AM 06/10/2021    9:32 AM 04/09/2021   10:01 AM 06/08/2020    3:52 PM 11/14/2019    2:58 PM  PHQ 2/9 Scores  PHQ - 2 Score 0 0 0 0 2 0 0  PHQ- 9 Score  0 0  7      Fall Risk     06/16/2023    3:19 PM 06/12/2022  3:16 PM 06/12/2022    3:05 PM 06/10/2021    9:28 AM 06/08/2020    3:58 PM  Fall Risk   Falls in the past year? 0 0 0 0 0  Number falls in past yr: 0 0 0 0 0  Injury with Fall? 0 0 0 0 0  Risk for fall due to : No Fall Risks No Fall Risks No Fall Risks No Fall Risks No Fall Risks  Follow up Falls prevention discussed;Falls evaluation completed Falls prevention discussed Falls prevention discussed Falls evaluation completed Falls evaluation completed    MEDICARE RISK AT HOME:  Medicare Risk at Home Any stairs in or around the home?: Yes If so, are there any without handrails?: No Home free of loose throw rugs in walkways, pet beds, electrical cords, etc?: Yes Adequate lighting in your home to reduce risk of falls?: Yes Life alert?: No Use of a cane, walker or w/c?: No Grab bars in the bathroom?: No Shower chair or bench in  shower?: No Elevated toilet seat or a handicapped toilet?: No  TIMED UP AND GO:  Was the test performed?  No  Cognitive Function: 6CIT completed        06/16/2023    3:22 PM 06/12/2022    3:08 PM  6CIT Screen  What Year? 0 points 0 points  What month? 0 points 0 points  What time? 0 points 0 points  Count back from 20 0 points 0 points  Months in reverse 0 points 0 points  Repeat phrase 0 points 0 points  Total Score 0 points 0 points    Immunizations Immunization History  Administered Date(s) Administered   Fluad Quad(high Dose 65+) 11/27/2022   Influenza Whole 01/21/2008, 01/16/2009, 11/21/2013   Influenza, High Dose Seasonal PF 01/12/2022   Influenza, Seasonal, Injecte, Preservative Fre 01/14/2017   Influenza,inj,Quad PF,6+ Mos 12/09/2018   Influenza-Unspecified 12/12/2014, 12/25/2015, 01/07/2018, 11/20/2020   Moderna Covid-19 Fall Seasonal Vaccine 66yrs & older 02/26/2022   Moderna Covid-19 Vaccine Bivalent Booster 36yrs & up 07/26/2021   Moderna Sars-Covid-2 Vaccination 03/31/2019, 04/29/2019, 11/04/2019, 03/31/2020   PNEUMOCOCCAL CONJUGATE-20 04/03/2021   PPD Test 09/13/2012, 09/15/2012, 04/15/2017, 03/30/2020   Pneumococcal Polysaccharide-23 01/16/2009, 02/23/2014, 10/25/2018   Tdap 05/22/2011   Zoster Recombinant(Shingrix) 11/13/2021, 04/09/2022    Screening Tests Health Maintenance  Topic Date Due   DTaP/Tdap/Td (2 - Td or Tdap) 05/21/2021   Lung Cancer Screening  02/25/2022   Fecal DNA (Cologuard)  05/16/2024   Medicare Annual Wellness (AWV)  06/15/2024   MAMMOGRAM  05/25/2025   Pneumonia Vaccine 39+ Years old  Completed   INFLUENZA VACCINE  Completed   DEXA SCAN  Completed   Hepatitis C Screening  Completed   Zoster Vaccines- Shingrix  Completed   HPV VACCINES  Aged Out   Colonoscopy  Discontinued   COVID-19 Vaccine  Discontinued    Health Maintenance  Health Maintenance Due  Topic Date Due   DTaP/Tdap/Td (2 - Td or Tdap) 05/21/2021   Lung  Cancer Screening  02/25/2022   Health Maintenance Items Addressed: Lung Cancer Screening ordered today.    Tdap vaccine: pt stated received vaccine at [facility] - asked to obtain report for PCP.  Additional Screening:  Vision Screening: Recommended annual ophthalmology exams for early detection of glaucoma and other disorders of the eye.  Dental Screening: Recommended annual dental exams for proper oral hygiene  Community Resource Referral / Chronic Care Management: CRR required this visit?  No   CCM required this  visit?  No     Plan:     I have personally reviewed and noted the following in the patient's chart:   Medical and social history Use of alcohol, tobacco or illicit drugs  Current medications and supplements including opioid prescriptions. Patient is currently taking opioid prescriptions. Information provided to patient regarding non-opioid alternatives. Patient advised to discuss non-opioid treatment plan with their provider. Functional ability and status Nutritional status Physical activity Advanced directives List of other physicians Hospitalizations, surgeries, and ER visits in previous 12 months Vitals Screenings to include cognitive, depression, and falls Referrals and appointments  In addition, I have reviewed and discussed with patient certain preventive protocols, quality metrics, and best practice recommendations. A written personalized care plan for preventive services as well as general preventive health recommendations were provided to patient.     Darreld Mclean, CMA   06/16/2023   After Visit Summary: (MyChart) Due to this being a telephonic visit, the after visit summary with patients personalized plan was offered to patient via MyChart   Notes: Please refer to Routing Comments.

## 2023-06-16 NOTE — Patient Instructions (Signed)
 [name] , Thank you for taking time to come for your Medicare Wellness Visit. I appreciate your ongoing commitment to your health goals. Please review the following plan we discussed and let me know if I can assist you in the future.   Referrals/Orders/Follow-Ups/Clinician Recommendations: Aim for 30 minutes of exercise or brisk walking, 6-8 glasses of water, and 5 servings of fruits and vegetables each day.   This is a list of the screening recommended for you and due dates:  Health Maintenance  Topic Date Due   DTaP/Tdap/Td vaccine (2 - Td or Tdap) 05/21/2021   Screening for Lung Cancer  02/25/2022   Cologuard (Stool DNA test)  05/16/2024   Medicare Annual Wellness Visit  06/15/2024   Mammogram  05/25/2025   Pneumonia Vaccine  Completed   Flu Shot  Completed   DEXA scan (bone density measurement)  Completed   Hepatitis C Screening  Completed   Zoster (Shingles) Vaccine  Completed   HPV Vaccine  Aged Out   Colon Cancer Screening  Discontinued   COVID-19 Vaccine  Discontinued    Advanced directives: (Provided) Advance directive discussed with you today. I have provided a copy for you to complete at home and have notarized. Once this is complete, please bring a copy in to our office so we can scan it into your chart.   Next Medicare Annual Wellness Visit scheduled for next year: Yes   Managing Pain Without Opioids Opioids are strong medicines used to treat moderate to severe pain. For some people, especially those who have long-term (chronic) pain, opioids may not be the best choice for pain management due to: Side effects like nausea, constipation, and sleepiness. The risk of addiction (opioid use disorder). The longer you take opioids, the greater your risk of addiction. Pain that lasts for more than 3 months is called chronic pain. Managing chronic pain usually requires more than one approach and is often provided by a team of health care providers working together  (multidisciplinary approach). Pain management may be done at a pain management center or pain clinic. How to manage pain without the use of opioids Use non-opioid medicines Non-opioid medicines for pain may include: Over-the-counter or prescription non-steroidal anti-inflammatory drugs (NSAIDs). These may be the first medicines used for pain. They work well for muscle and bone pain, and they reduce swelling. Acetaminophen. This over-the-counter medicine may work well for milder pain but not swelling. Antidepressants. These may be used to treat chronic pain. A certain type of antidepressant (tricyclics) is often used. These medicines are given in lower doses for pain than when used for depression. Anticonvulsants. These are usually used to treat seizures but may also reduce nerve (neuropathic) pain. Muscle relaxants. These relieve pain caused by sudden muscle tightening (spasms). You may also use a pain medicine that is applied to the skin as a patch, cream, or gel (topical analgesic), such as a numbing medicine. These may cause fewer side effects than medicines taken by mouth. Do certain therapies as directed Some therapies can help with pain management. They include: Physical therapy. You will do exercises to gain strength and flexibility. A physical therapist may teach you exercises to move and stretch parts of your body that are weak, stiff, or painful. You can learn these exercises at physical therapy visits and practice them at home. Physical therapy may also involve: Massage. Heat wraps or applying heat or cold to affected areas. Electrical signals that interrupt pain signals (transcutaneous electrical nerve stimulation, TENS). Weak lasers  that reduce pain and swelling (low-level laser therapy). Signals from your body that help you learn to regulate pain (biofeedback). Occupational therapy. This helps you to learn ways to function at home and work with less pain. Recreational therapy. This  involves trying new activities or hobbies, such as a physical activity or drawing. Mental health therapy, including: Cognitive behavioral therapy (CBT). This helps you learn coping skills for dealing with pain. Acceptance and commitment therapy (ACT) to change the way you think and react to pain. Relaxation therapies, including muscle relaxation exercises and mindfulness-based stress reduction. Pain management counseling. This may be individual, family, or group counseling.  Receive medical treatments Medical treatments for pain management include: Nerve block injections. These may include a pain blocker and anti-inflammatory medicines. You may have injections: Near the spine to relieve chronic back or neck pain. Into joints to relieve back or joint pain. Into nerve areas that supply a painful area to relieve body pain. Into muscles (trigger point injections) to relieve some painful muscle conditions. A medical device placed near your spine to help block pain signals and relieve nerve pain or chronic back pain (spinal cord stimulation device). Acupuncture. Follow these instructions at home Medicines Take over-the-counter and prescription medicines only as told by your health care provider. If you are taking pain medicine, ask your health care providers about possible side effects to watch out for. Do not drive or use heavy machinery while taking prescription opioid pain medicine. Lifestyle  Do not use drugs or alcohol to reduce pain. If you drink alcohol, limit how much you have to: 0-1 drink a day for women who are not pregnant. 0-2 drinks a day for men. Know how much alcohol is in a drink. In the [place] one drink equals one 12 oz bottle of beer (355 mL), one 5 oz glass of wine (148 mL), or one 1 oz glass of hard liquor (44 mL). Do not use any products that contain nicotine or tobacco. These products include cigarettes, chewing tobacco, and vaping devices, such as e-cigarettes. If you  need help quitting, ask your health care provider. Eat a healthy diet and maintain a healthy weight. Poor diet and excess weight may make pain worse. Eat foods that are high in fiber. These include fresh fruits and vegetables, whole grains, and beans. Limit foods that are high in fat and processed sugars, such as fried and sweet foods. Exercise regularly. Exercise lowers stress and may help relieve pain. Ask your health care provider what activities and exercises are safe for you. If your health care provider approves, join an exercise class that combines movement and stress reduction. Examples include yoga and tai chi. Get enough sleep. Lack of sleep may make pain worse. Lower stress as much as possible. Practice stress reduction techniques as told by your therapist. General instructions Work with all your pain management providers to find the treatments that work best for you. You are an important member of your pain management team. There are many things you can do to reduce pain on your own. Consider joining an online or in-person support group for people who have chronic pain. Keep all follow-up visits. This is important. Where to find more information You can find more information about managing pain without opioids from: American Academy of Pain Medicine: painmed.org Institute for Chronic Pain: instituteforchronicpain.org American Chronic Pain Association: theacpa.org Contact a health care provider if: You have side effects from pain medicine. Your pain gets worse or does not get better with treatments  or home therapy. You are struggling with anxiety or depression. Summary Many types of pain can be managed without opioids. Chronic pain may respond better to pain management without opioids. Pain is best managed when you and a team of health care providers work together. Pain management without opioids may include non-opioid medicines, medical treatments, physical therapy, mental health  therapy, and lifestyle changes. Tell your health care providers if your pain gets worse or is not being managed well enough. This information is not intended to replace advice given to you by your health care provider. Make sure you discuss any questions you have with your health care provider. Document Revised: 06/20/2020 Document Reviewed: 06/20/2020 Elsevier Patient Education  2024 [employer].

## 2023-06-29 ENCOUNTER — Ambulatory Visit (INDEPENDENT_AMBULATORY_CARE_PROVIDER_SITE_OTHER): Admitting: Orthopaedic Surgery

## 2023-06-29 VITALS — Wt 222.0 lb

## 2023-06-29 DIAGNOSIS — M87051 Idiopathic aseptic necrosis of right femur: Secondary | ICD-10-CM | POA: Diagnosis not present

## 2023-06-29 DIAGNOSIS — M87052 Idiopathic aseptic necrosis of left femur: Secondary | ICD-10-CM | POA: Diagnosis not present

## 2023-06-29 NOTE — Progress Notes (Signed)
 The patient is someone I am seeing for the first time but she is sent to me by one of our own providers in follow-up after having a MRI of her right hip.  She has an acute onset of hip pain from earlier this year that is rapidly worsening.  She does have left hip pain as well.  She is 68 years old.  She does have a history of lupus and does let me know that she has been on steroids for about 7 years.  She does see Dr. Alvester Morin for her spine.  She has had facet joint injections as well.  I was able to review all of her past medical history and medications within epic.  She is not a diabetic.  Her BMI is 34.77.  Examination of both hips shows that moves smoothly but have significant pain in the groin with the right much worse than the left.  The MRI is reviewed with her and I showed her the MRI study report as well as the actual images.  She does have significant findings of avascular necrosis in the femoral head on both sides with subchondral reactive edema as well.  There is no collapse as of yet but it is quite significant with both hips.  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  I explained in detail what this means showing her hip replacement model and describing treatment modalities.  Given the severity of her AVN which is likely related to longtime steroid use, we are recommending a right total hip arthroplasty.  I discussed the risks and benefits of the surgery and what to expect from an intraoperative and postoperative standpoint.  I went over hip replacement model and gave her hip replacement handout.  This is something I think we do need to address in the near future and I want her to offload her hip using a cane in her opposite left hand and to avoid high impact aerobic activities well we will work on getting her scheduled for a right total hip arthroplasty.  All questions and concerns were answered and addressed.  She will likely need to have her left hip  replaced later this year as well.

## 2023-06-30 ENCOUNTER — Other Ambulatory Visit: Payer: Self-pay | Admitting: *Deleted

## 2023-06-30 ENCOUNTER — Telehealth: Payer: Self-pay | Admitting: *Deleted

## 2023-06-30 DIAGNOSIS — Z122 Encounter for screening for malignant neoplasm of respiratory organs: Secondary | ICD-10-CM

## 2023-06-30 DIAGNOSIS — Z87891 Personal history of nicotine dependence: Secondary | ICD-10-CM

## 2023-06-30 NOTE — Telephone Encounter (Signed)
 Lung Cancer Screening Narrative/Criteria Questionnaire (Cigarette Smokers Only- No Cigars/Pipes/vapes)   [name]   SDMV:07/21/23 1:30- Maralyn Sago                                           1956-03-23              LDCT: 07/21/23 4:30- GI    68 y.o.   Phone: [phone]  Lung Screening Narrative (confirm age 59-77 yrs Medicare / 50-80 yrs Private pay insurance)   Insurance information:BCBS   Referring Provider:Jones   This screening involves an initial phone call with a team member from our program. It is called a shared decision making visit. The initial meeting is required by insurance and Medicare to make sure you understand the program. This appointment takes about 15-20 minutes to complete. The CT scan will completed at a separate date/time. This scan takes about 5-10 minutes to complete and you may eat and drink before and after the scan.  Criteria questions for Lung Cancer Screening:   Are you a current or former smoker? Former Age began smoking: 21   If you are a former smoker, what year did you quit smoking?  8 yrs ago(within 15 yrs)   To calculate your smoking history, I need an accurate estimate of how many packs of cigarettes you smoked per day and for how many years. (Not just the number of PPD you are now smoking)   Years smoking 38 x Packs per day 1/2 - 3/4 = Pack years 20   (at least 20 pack yrs)   (Make sure they understand that we need to know how much they have smoked in the past, not just the number of PPD they are smoking now)  Do you have a personal history of cancer?  No    Do you have a family history of cancer? Yes  (cancer type and and relative) mother(breast & cervical) Brother (lung) Sister (breast)   Are you coughing up blood?  No  Have you had unexplained weight loss of 15 lbs or more in the last 6 months? No  It looks like you meet all criteria.     Additional information: N/A

## 2023-07-01 ENCOUNTER — Encounter: Payer: Self-pay | Admitting: Cardiology

## 2023-07-01 NOTE — Telephone Encounter (Signed)
 MEDICATION DENIED.  Request for coverage of Mounjaro (tirzepatide) has been denied. A request for prescription coverage for Mounjaro (tirzepatide) was recently submitted on your behalf. After careful consideration and review of the information sent to [name], this request was not approved. We understand that this decision may not be what you and your doctor expected. This letter and the enclosed information will explain your options and help you decide what to do next. We reviewed all the supporting information sent to [name] and used your plan's guidelines to make our decision. Why your request was denied: Current plan approved criteria allows coverage of Mounjaro if the following requirement is met: -Does the patient have a diagnosis of type 2 diabetes mellitus? Your use of this drug is either unknown or does not meet the requirement. This is based on the information that was provided. We've let your doctor know about this denial, so they will be ready to work with you on your next steps or an alternative treatment plan.

## 2023-07-02 ENCOUNTER — Telehealth: Payer: Self-pay | Admitting: Cardiology

## 2023-07-02 NOTE — Telephone Encounter (Signed)
 Attempted to call patient, no answer left message requesting a call back.

## 2023-07-02 NOTE — Telephone Encounter (Signed)
 Spoke with pt   Appt made

## 2023-07-02 NOTE — Telephone Encounter (Signed)
 Pt states that she was speaking with Dr Servando Salina yesterday ans she was supposed to call her back regarding getting in to she her sooner. Requesting cb from Dr Servando Salina

## 2023-07-06 ENCOUNTER — Ambulatory Visit (INDEPENDENT_AMBULATORY_CARE_PROVIDER_SITE_OTHER): Admitting: Family Medicine

## 2023-07-06 NOTE — Telephone Encounter (Signed)
 Spoke with pt, she is aware she has a follow up appointment with dr Emmette Harms tomorrow.

## 2023-07-07 ENCOUNTER — Encounter: Payer: Self-pay | Admitting: Cardiology

## 2023-07-07 ENCOUNTER — Ambulatory Visit: Attending: Cardiology | Admitting: Cardiology

## 2023-07-07 VITALS — BP 112/72 | HR 56 | Ht 67.0 in | Wt 224.2 lb

## 2023-07-07 DIAGNOSIS — I10 Essential (primary) hypertension: Secondary | ICD-10-CM | POA: Diagnosis not present

## 2023-07-07 DIAGNOSIS — I2510 Atherosclerotic heart disease of native coronary artery without angina pectoris: Secondary | ICD-10-CM

## 2023-07-07 DIAGNOSIS — Z9889 Other specified postprocedural states: Secondary | ICD-10-CM

## 2023-07-07 DIAGNOSIS — I255 Ischemic cardiomyopathy: Secondary | ICD-10-CM

## 2023-07-07 DIAGNOSIS — E785 Hyperlipidemia, unspecified: Secondary | ICD-10-CM | POA: Diagnosis not present

## 2023-07-07 DIAGNOSIS — I739 Peripheral vascular disease, unspecified: Secondary | ICD-10-CM

## 2023-07-07 DIAGNOSIS — I7121 Aneurysm of the ascending aorta, without rupture: Secondary | ICD-10-CM

## 2023-07-07 NOTE — Patient Instructions (Signed)
 Medication Instructions:  Your physician recommends that you continue on your current medications as directed. Please refer to the Current Medication list given to you today.  *If you need a refill on your cardiac medications before your next appointment, please call your pharmacy*   Follow-Up: At [facility], you and your health needs are our priority.  As part of our continuing mission to provide you with exceptional heart care, our providers are all part of one team.  This team includes your primary Cardiologist (physician) and Advanced Practice Providers or APPs (Physician Assistants and Nurse Practitioners) who all work together to provide you with the care you need, when you need it.  Your next appointment:   16 week(s)  Provider:   Thomasene Ripple, DO   Other Instructions   1st Floor: - Lobby - Registration  - Pharmacy  - Lab - Cafe  2nd Floor: - PV Lab - Diagnostic Testing (echo, CT, nuclear med)  3rd Floor: - Vacant  4th Floor: - TCTS (cardiothoracic surgery) - AFib Clinic - Structural Heart Clinic - Vascular Surgery  - Vascular Ultrasound  5th Floor: - [facility] Cardiology (general and EP) - Clinical Pharmacy for coumadin, hypertension, lipid, weight-loss medications, and med management appointments    Valet parking services will be available as well.

## 2023-07-07 NOTE — Progress Notes (Unsigned)
 Cardiology Office Note:    Date:  07/08/2023   ID:  [name], DOB Mar 15, 1956, MRN [id number]  PCP:  Etta Grandchild, MD  Cardiologist:  Thomasene Ripple, DO  Electrophysiologist:  None   Referring MD: Etta Grandchild, MD   " I am doing fine"  History of Present Illness:    [name] is a 68 y.o. female with a hx of lupus erythematous, coronary artery disease with prior history of stenting and ultimately four-vessel bypass in 2018, mitral valve annuloplasty at the time of her CABG in 2018, postoperative atrial fibrillation, peripheral artery disease and ascending aortic aneurysm.   At her last visit on 02/05/2023 at that time she was doing well from a Cv standpoint. No medication changes were done.   She is here today because she is getting ready for hip surgery. She needs booth hips repair. She is not experiencing any chest pain or shortness of breath. She works out routinely. Most of the issues she is  currently experiencing is from her hip.     Past Medical History:  Diagnosis Date   Back pain    Bilateral swelling of feet and ankles    CAD (coronary artery disease)    a. remote stents/angioplasty. b. s/p CABG 01/2017 with mitral valve annuloplasty.   Chest pain    Constipation    Diastolic heart failure (HCC)    Esophageal reflux    Gallbladder problem    GERD (gastroesophageal reflux disease)    History of MI (myocardial infarction)    Hyperlipidemia    Hypertension    Hypothyroidism    Immune thrombocytopenic purpura (HCC)    Joint pain    Kidney problem    Lupus erythematosus    Mitral valve insufficiency and aortic valve insufficiency    Myocardial infarction (HCC)    x 2   Other fatigue    PONV (postoperative nausea and vomiting)    Postoperative atrial fibrillation (HCC)    Renal disease    Severe mitral regurgitation    a. s/p MV annuloplasty 01/2017 at time of CABG.   Shortness of breath on exertion    Sleep apnea    Status post mitral  valve annuloplasty    Unspecified disease of pericardium     Past Surgical History:  Procedure Laterality Date   BREAST BIOPSY Right 06/03/2023   MM RT BREAST BX W LOC DEV 1ST LESION IMAGE BX SPEC STEREO GUIDE 06/03/2023 [facility]   CHOLECYSTECTOMY, LAPAROSCOPIC  2010   CORONARY ARTERY BYPASS GRAFT N/A 01/30/2017   Procedure: CORONARY ARTERY BYPASS GRAFTING (CABG), Times four  using the right saphaneous vein, harvested endoscopicly.  and left internal mammary artery .;  Surgeon: Alleen Borne, MD;  Location: [facility] OR;  Service: Open Heart Surgery;  Laterality: N/A;   LEFT HEART CATH AND CORONARY ANGIOGRAPHY N/A 01/23/2017   Procedure: LEFT HEART CATH AND CORONARY ANGIOGRAPHY;  Surgeon: Lyn Records, MD;  Location: [facility] INVASIVE CV LAB;  Service: Cardiovascular;  Laterality: N/A;   MITRAL VALVE REPAIR N/A 01/30/2017   Procedure: MITRAL VALVE REPAIR (MVR);  Surgeon: Alleen Borne, MD;  Location: [facility] OR;  Service: Open Heart Surgery;  Laterality: N/A;   plastic surgical repair      dog bite to leg   RIGHT/LEFT HEART CATH AND CORONARY/GRAFT ANGIOGRAPHY N/A 02/26/2021   Procedure: RIGHT/LEFT HEART CATH AND CORONARY/GRAFT ANGIOGRAPHY;  Surgeon: Lyn Records, MD;  Location: [facility] INVASIVE CV LAB;  Service: Cardiovascular;  Laterality: N/A;   SPLENECTOMY  5-04   TEE WITHOUT CARDIOVERSION N/A 01/27/2017   Procedure: TRANSESOPHAGEAL ECHOCARDIOGRAM (TEE);  Surgeon: Luana Rumple, MD;  Location: [facility] ENDOSCOPY;  Service: Cardiovascular;  Laterality: N/A;   TEE WITHOUT CARDIOVERSION N/A 01/30/2017   Procedure: TRANSESOPHAGEAL ECHOCARDIOGRAM (TEE);  Surgeon: Bartley Lightning, MD;  Location: [facility] OR;  Service: Open Heart Surgery;  Laterality: N/A;   ULTRASOUND GUIDANCE FOR VASCULAR ACCESS  01/23/2017   Procedure: Ultrasound Guidance For Vascular Access;  Surgeon: Arty Binning, MD;  Location: [facility] INVASIVE CV LAB;  Service: Cardiovascular;;    Current Medications: Current Meds  Medication Sig   albuterol  (VENTOLIN HFA) 108 (90 Base) MCG/ACT inhaler Inhale 2 puffs into the lungs every 6 (six) hours as needed for wheezing or shortness of breath.   aspirin 81 MG chewable tablet Chew 1 tablet (81 mg total) by mouth daily.   azaTHIOprine (IMURAN) 50 MG tablet TAKE 1 TABLET BY MOUTH IN THE MORNING AND IN THE EVENING   cyclobenzaprine (FLEXERIL) 5 MG tablet Take 1 tablet (5 mg total) by mouth 3 (three) times daily as needed.   dapagliflozin propanediol (FARXIGA) 10 MG TABS tablet Take 1 tablet (10 mg total) by mouth daily.   fluticasone (FLONASE) 50 MCG/ACT nasal spray SPRAY 2 SPRAYS INTO EACH NOSTRIL EVERY DAY (Patient taking differently: Place 2 sprays into both nostrils daily as needed for allergies or rhinitis.)   furosemide (LASIX) 20 MG tablet TAKE 1 TABLET BY MOUTH EVERY DAY   HYDROcodone-acetaminophen (NORCO/VICODIN) 5-325 MG tablet Take 1 tablet by mouth every 6 (six) hours as needed for moderate pain (pain score 4-6).   hydroxychloroquine (PLAQUENIL) 200 MG tablet Take 200 mg by mouth daily.    isosorbide mononitrate (IMDUR) 30 MG 24 hr tablet Take 1 tablet (30 mg total) by mouth daily.   levothyroxine (SYNTHROID) 75 MCG tablet Take 1 tablet (75 mcg total) by mouth daily before breakfast.   LINZESS 290 MCG CAPS capsule Take 290 mcg by mouth daily. Patient takes as needed   metoprolol succinate (TOPROL-XL) 50 MG 24 hr tablet Take 1 tablet (50 mg total) by mouth daily.   nitroGLYCERIN (NITROSTAT) 0.4 MG SL tablet Place 1 tablet (0.4 mg total) under the tongue every 5 (five) minutes as needed for chest pain.   pantoprazole (PROTONIX) 40 MG tablet Take 1 tablet (40 mg total) by mouth daily.   polyethylene glycol powder (GLYCOLAX/MIRALAX) 17 GM/SCOOP powder 17 gram po BID prn constipation   potassium chloride SA (KLOR-CON M) 20 MEQ tablet Take 1 tablet (20 mEq total) by mouth daily.   pregabalin (LYRICA) 50 MG capsule Take 50 mg by mouth 2 (two) times daily. Patient taking as needed   rosuvastatin  (CRESTOR) 20 MG tablet Take 1 tablet (20 mg total) by mouth daily.   tirzepatide (MOUNJARO) 10 MG/0.5ML Pen Inject 10 mg into the skin once a week.     Allergies:   Amlodipine and Cellcept [mycophenolate]   Social History   Socioeconomic History   Marital status: Divorced    Spouse name: Not on file   Number of children: 2   Years of education: Not on file   Highest education level: Master's degree (e.g., MA, MS, MEng, MEd, MSW, MBA)  Occupational History   OccupationPassenger transport manager    Employer: [employer]   Tobacco Use   Smoking status: Former    Current packs/day: 0.00    Average packs/day: 1 pack/day for 30.0 years (30.0  ttl pk-yrs)    Types: Cigarettes    Start date: 03/23/1986    Quit date: 12/23/2015    Years since quitting: 7.5    Passive exposure: Past   Smokeless tobacco: Never  Vaping Use   Vaping status: Never Used  Substance and Sexual Activity   Alcohol use: No   Drug use: No   Sexual activity: Not Currently    Comment: 1st intercourse 68 yo-More than 5 partners  Other Topics Concern   Not on file  Social History Narrative   HSG,  graduated from [employer] in [place]. In college [name] -grad '13 with relgious studies. [employer] - Fall '13 for MA-divinity. Marrried '73 - 2 years/ divorced. 2 son- '73, '75 - CP.    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    Social Drivers of Corporate investment banker Strain: Low Risk  (06/16/2023)   Overall Financial Resource Strain (CARDIA)    Difficulty of Paying Living Expenses: Not hard at all  Food Insecurity: No Food Insecurity (06/16/2023)   Hunger Vital Sign    Worried About Running Out of Food in the Last Year: Never true    Ran Out of Food in the Last Year: Never true  Transportation Needs: No Transportation Needs (06/16/2023)   PRAPARE - Scientist, research (physical sciences) (Medical): No    Lack of Transportation (Non-Medical): No  Physical Activity: Sufficiently Active (06/16/2023)   Exercise Vital Sign    Days of Exercise per Week: 3 days    Minutes of Exercise per Session: 60 min  Stress: No Stress Concern Present (06/16/2023)   [employer] of Occupational Health - Occupational Stress Questionnaire    Feeling of Stress : Not at all  Social Connections: Moderately Integrated (06/16/2023)   Social Connection and Isolation Panel [NHANES]    Frequency of Communication with Friends and Family: More than three times a week    Frequency of Social Gatherings with Friends and Family: More than three times a week    Attends Religious Services: More than 4 times per year    Active Member of [employer] or Organizations: Yes    Attends Engineer, structural: More than 4 times per year    Marital Status: Divorced     Family History: The patient's family history includes Breast cancer in her mother and sister; Cancer in her mother and paternal grandfather; Fibromyalgia in her mother; GER disease in her father; Heart attack in her maternal grandmother; Heart disease in her father and mother; Hyperlipidemia in her mother; Hypertension in her father; Paget's disease of bone in her mother.  ROS:   Review of Systems  Constitution: Negative for decreased appetite, fever and weight gain.  HENT: Negative for congestion, ear discharge, hoarse voice and sore throat.   Eyes: Negative for discharge, redness, vision loss in right eye and visual halos.  Cardiovascular: Negative for chest pain, dyspnea on exertion, leg swelling, orthopnea and palpitations.  Respiratory: Negative for cough, hemoptysis, shortness of breath and snoring.   Endocrine: Negative for heat intolerance and polyphagia.  Hematologic/Lymphatic: Negative for bleeding problem. Does not bruise/bleed easily.  Skin: Negative for flushing, nail changes, rash and suspicious lesions.   Musculoskeletal: Negative for arthritis, joint pain, muscle cramps, myalgias, neck pain and stiffness.  Gastrointestinal: Negative for abdominal pain, bowel incontinence, diarrhea and excessive appetite.  Genitourinary: Negative for decreased libido,  genital sores and incomplete emptying.  Neurological: Negative for brief paralysis, focal weakness, headaches and loss of balance.  Psychiatric/Behavioral: Negative for altered mental status, depression and suicidal ideas.  Allergic/Immunologic: Negative for HIV exposure and persistent infections.    EKGs/Labs/Other Studies Reviewed:    The following studies were reviewed today:   EKG:  The ekg ordered today demonstrates sinus rhythm,   Recent Labs: 03/19/2023: ALT 22; BUN 18; Creatinine 1.3; Hemoglobin 15.1; Platelets 208; Potassium 4.1; Sodium 141; TSH 1.50  Recent Lipid Panel    Component Value Date/Time   CHOL 130 03/19/2023 0000   CHOL 134 04/23/2022 1456   TRIG 47 03/19/2023 0000   HDL 49 03/19/2023 0000   HDL 55 04/23/2022 1456   CHOLHDL 3 03/02/2023 1552   VLDL 12.0 03/02/2023 1552   LDLCALC 64 03/19/2023 0000   LDLCALC 67 04/23/2022 1456   LDLCALC 68 11/14/2019 1537   LDLDIRECT 321.8 02/27/2012 1202    Physical Exam:    VS:  BP 112/72 (BP Location: Right Arm, Patient Position: Sitting, Cuff Size: Normal)   Pulse (!) 56   Ht 5\' 7"  (1.702 m)   Wt 224 lb 3.2 oz (101.7 kg)   SpO2 95%   BMI 35.11 kg/m     Wt Readings from Last 3 Encounters:  07/07/23 224 lb 3.2 oz (101.7 kg)  06/29/23 222 lb (100.7 kg)  06/16/23 217 lb (98.4 kg)     GEN: Well nourished, well developed in no acute distress HEENT: Normal NECK: No JVD; No carotid bruits LYMPHATICS: No lymphadenopathy CARDIAC: S1S2 noted,RRR, no murmurs, rubs, gallops RESPIRATORY:  Clear to auscultation without rales, wheezing or rhonchi  ABDOMEN: Soft, non-tender, non-distended, +bowel sounds, no guarding. EXTREMITIES: No edema, No cyanosis, no  clubbing MUSCULOSKELETAL:  No deformity  SKIN: Warm and dry NEUROLOGIC:  Alert and oriented x 3, non-focal PSYCHIATRIC:  Normal affect, good insight  ASSESSMENT:    1. Ischemic cardiomyopathy   2. Primary hypertension   3. Hyperlipidemia LDL goal <55   4. Coronary artery disease involving native coronary artery of native heart without angina pectoris   5. PAD (peripheral artery disease) (HCC)   6. Aneurysm of ascending aorta without rupture (HCC)   7. Morbid obesity (HCC)   8. S/P MVR (mitral valve repair)     PLAN:    Ischemic Cardiomyopathy - Stable on current regimen. No new symptoms of heart failure. Patient is active and exercising regularly.  Continue current medications:Aspirin, Farxiga, Isosorbide, Metoprolol, Nitroglycerin, and Crestor.  LDL is still greater that 55. But she has labs coming up soon  Blood pressure is acceptable, continue with current antihypertensive regimen.  Hyperlipidemia - continue with current statin medication.  S/p mitral valve repair - will get a repeat echo. Last echo was in 2023.   The patient does not have any unstable cardiac conditions.  Upon evaluation today, she can achieve 4 METs or greater without anginal symptoms.  According to [facility] and AHA guidelines, she requires no further cardiac workup prior to her noncardiac surgery and should be at acceptable risk.  Our service is available as necessary in the perioperative period.  The patient is in agreement with the above plan. The patient left the office in stable condition.  The patient will follow up in 1 year   Medication Adjustments/Labs and Tests Ordered: Current medicines are reviewed at length with the patient today.  Concerns regarding medicines are outlined above.  No orders of the defined types were placed in this encounter.  No orders of the defined types were placed in this encounter.   Patient Instructions  Medication Instructions:  Your physician recommends that you  continue on your current medications as directed. Please refer to the Current Medication list given to you today.  *If you need a refill on your cardiac medications before your next appointment, please call your pharmacy*  Follow-Up: At [facility], you and your health needs are our priority.  As part of our continuing mission to provide you with exceptional heart care, our providers are all part of one team.  This team includes your primary Cardiologist (physician) and Advanced Practice Providers or APPs (Physician Assistants and Nurse Practitioners) who all work together to provide you with the care you need, when you need it.  Your next appointment:   16 week(s)  Provider:   Jerryl Morin, DO     Other Instructions:   1st Floor: - Lobby - Registration  - Pharmacy  - Lab - Cafe  2nd Floor: - PV Lab - Diagnostic Testing (echo, CT, nuclear med)  3rd Floor: - Vacant  4th Floor: - TCTS (cardiothoracic surgery) - AFib Clinic - Structural Heart Clinic - Vascular Surgery  - Vascular Ultrasound  5th Floor: - HeartCare Cardiology (general and EP) - Clinical Pharmacy for coumadin, hypertension, lipid, weight-loss medications, and med management appointments    Valet parking services will be available as well.      Adopting a Healthy Lifestyle.  Know what a healthy weight is for you (roughly BMI <25) and aim to maintain this   Aim for 7+ servings of fruits and vegetables daily   65-80+ fluid ounces of water or unsweet tea for healthy kidneys   Limit to max 1 drink of alcohol per day; avoid smoking/tobacco   Limit animal fats in diet for cholesterol and heart health - choose grass fed whenever available   Avoid highly processed foods, and foods high in saturated/trans fats   Aim for low stress - take time to unwind and care for your mental health   Aim for 150 min of moderate intensity exercise weekly for heart health, and weights twice weekly for bone  health   Aim for 7-9 hours of sleep daily   When it comes to diets, agreement about the perfect plan isnt easy to find, even among the experts. Experts at the [facility] of [employer] developed an idea known as the Healthy Eating Plate. Just imagine a plate divided into logical, healthy portions.   The emphasis is on diet quality:   Load up on vegetables and fruits - one-half of your plate: Aim for color and variety, and remember that potatoes dont count.   Go for whole grains - one-quarter of your plate: Whole wheat, barley, wheat berries, quinoa, oats, brown rice, and foods made with them. If you want pasta, go with whole wheat pasta.   Protein power - one-quarter of your plate: Fish, chicken, beans, and nuts are all healthy, versatile protein sources. Limit red meat.   The diet, however, does go beyond the plate, offering a few other suggestions.   Use healthy plant oils, such as olive, canola, soy, corn, sunflower and peanut. Check the labels, and avoid partially hydrogenated oil, which have unhealthy trans fats.   If youre thirsty, drink water. Coffee and tea are good in moderation, but skip sugary drinks and limit milk and dairy products to one or two daily servings.   The type of carbohydrate in the diet is  more important than the amount. Some sources of carbohydrates, such as vegetables, fruits, whole grains, and beans-are healthier than others.   Finally, stay active  Signed, Jerryl Morin, DO  07/08/2023 7:29 PM    [facility]

## 2023-07-10 LAB — LAB REPORT - SCANNED: EGFR: 56

## 2023-07-21 ENCOUNTER — Ambulatory Visit: Admitting: Acute Care

## 2023-07-21 ENCOUNTER — Ambulatory Visit
Admission: RE | Admit: 2023-07-21 | Discharge: 2023-07-21 | Disposition: A | Discharge status: Home / Self Care | Source: Ambulatory Visit | Attending: Acute Care | Admitting: Acute Care

## 2023-07-21 ENCOUNTER — Encounter: Payer: Self-pay | Admitting: Acute Care

## 2023-07-21 DIAGNOSIS — Z87891 Personal history of nicotine dependence: Secondary | ICD-10-CM

## 2023-07-21 DIAGNOSIS — Z122 Encounter for screening for malignant neoplasm of respiratory organs: Secondary | ICD-10-CM

## 2023-07-21 NOTE — Progress Notes (Signed)
 Virtual Visit via Video Note  I connected with [name] on 07/21/23 at  1:30 PM EDT by a video enabled telemedicine application and verified that I am speaking with the correct person using two identifiers.  Location: Patient:  At home Provider:  [address], [place], [place], [address]    I discussed the limitations of evaluation and management by telemedicine and the availability of in person appointments. The patient expressed understanding and agreed to proceed.   Shared Decision Making Visit Lung Cancer Screening Program [phone])   Eligibility: Age 68 y.o. Pack Years Smoking History Calculation  20 pack year smoking history (# packs/per year x # years smoked) Recent History of coughing up blood  no Unexplained weight loss? no ( >Than 15 pounds within the last 6 months ) Prior History Lung / other cancer no (Diagnosis within the last 5 years already requiring surveillance chest CT Scans). Smoking Status Former Smoker Former Smokers: Years since quit: 8 years  Quit Date: 2017  Visit Components: Discussion included one or more decision making aids. yes Discussion included risk/benefits of screening. yes Discussion included potential follow up diagnostic testing for abnormal scans. yes Discussion included meaning and risk of over diagnosis. yes Discussion included meaning and risk of False Positives. yes Discussion included meaning of total radiation exposure. yes  Counseling Included: Importance of adherence to annual lung cancer LDCT screening. yes Impact of comorbidities on ability to participate in the program. yes Ability and willingness to under diagnostic treatment. yes  Smoking Cessation Counseling: Current Smokers:  Discussed importance of smoking cessation. yes Information about tobacco cessation classes and interventions provided to patient. yes Patient provided with "ticket" for LDCT Scan. yes Symptomatic Patient. no  Counseling  NA Diagnosis Code: Tobacco Use Z72.0 Asymptomatic Patient yes  Counseling (Intermediate counseling: > three minutes counseling) [id number] Former Smokers:  Discussed the importance of maintaining cigarette abstinence. yes Diagnosis Code: Personal History of Nicotine Dependence. Y77.412 Information about tobacco cessation classes and interventions provided to patient. Yes Patient provided with "ticket" for LDCT Scan. yes Written Order for Lung Cancer Screening with LDCT placed in Epic. Yes (CT Chest Lung Cancer Screening Low Dose W/O CM) [id number] Z12.2-Screening of respiratory organs Z87.891-Personal history of nicotine dependence  Former smoker quit 2017   Raejean Bullock, NP

## 2023-07-21 NOTE — Patient Instructions (Signed)

## 2023-07-29 NOTE — Progress Notes (Signed)
 Surgical Instructions   Your procedure is scheduled on Tuesday, May 20th.. Report to [facility] "A" at 5:30 A.M., then check in with the Admitting office. Any questions or running late day of surgery: call [phone]  Questions prior to your surgery date: call [phone], Monday-Friday, 8am-4pm. If you experience any cold or flu symptoms such as cough, fever, chills, shortness of breath, etc. between now and your scheduled surgery, please notify us  at the above number.     Remember:  Do not eat after midnight the night before your surgery  You may drink clear liquids until 4:30 the morning of your surgery.   Clear liquids allowed are: Water, Non-Citrus Juices (without pulp), Carbonated Beverages, Clear Tea (no milk, honey, etc.), Black Coffee Only (NO MILK, CREAM OR POWDERED CREAMER of any kind), and Gatorade.    Take these medicines the morning of surgery with A SIP OF WATER  isosorbide  mononitrate (IMDUR )  levothyroxine  (SYNTHROID )  metoprolol  succinate (TOPROL -XL)  pantoprazole  (PROTONIX )  pregabalin (LYRICA)  rosuvastatin  (CRESTOR )    May take these medicines IF NEEDED: albuterol  (VENTOLIN  HFA) inhaler - please bring with you on day of surgery  cyclobenzaprine  (FLEXERIL )  HYDROcodone -acetaminophen  (NORCO/VICODIN)  nitroGLYCERIN  (NITROSTAT ) - please call [phone] after taking this medication    Follow your surgeon's instructions on when to stop Asprin and hydroxycloroquine (PLAQUENIL ).  If no instructions were given by your surgeon then you will need to call the office to get those instructions.     One week prior to surgery, STOP taking any Aspirin  (unless otherwise instructed by your surgeon) Aleve, Naproxen, Ibuprofen, Motrin, Advil, Goody's, BC's, all herbal medications, fish oil, and non-prescription vitamins.                     Do NOT Smoke (Tobacco/Vaping) for 24 hours prior to your procedure.  If you use a CPAP at night, you may bring your  mask/headgear for your overnight stay.   You will be asked to remove any contacts, glasses, piercing's, hearing aid's, dentures/partials prior to surgery. Please bring cases for these items if needed.    Patients discharged the day of surgery will not be allowed to drive home, and someone needs to stay with them for 24 hours.  SURGICAL WAITING ROOM VISITATION Patients may have no more than 2 support people in the waiting area - these visitors may rotate.   Pre-op nurse will coordinate an appropriate time for 1 ADULT support person, who may not rotate, to accompany patient in pre-op.  Children under the age of 71 must have an adult with them who is not the patient and must remain in the main waiting area with an adult.  If the patient needs to stay at the hospital during part of their recovery, the visitor guidelines for inpatient rooms apply.  Please refer to the [facility] website for the visitor guidelines for any additional information.   If you received a COVID test during your pre-op visit  it is requested that you wear a mask when out in public, stay away from anyone that may not be feeling well and notify your surgeon if you develop symptoms. If you have been in contact with anyone that has tested positive in the last 10 days please notify you surgeon.      Pre-operative 5 CHG Bathing Instructions   You can play a key role in reducing the risk of infection after surgery. Your skin needs to be as free of germs as  possible. You can reduce the number of germs on your skin by washing with CHG (chlorhexidine  gluconate) soap before surgery. CHG is an antiseptic soap that kills germs and continues to kill germs even after washing.   DO NOT use if you have an allergy to chlorhexidine /CHG or antibacterial soaps. If your skin becomes reddened or irritated, stop using the CHG and notify one of our RNs at [phone].   Please shower with the CHG soap starting 4 days before surgery using the  following schedule:     Please keep in mind the following:  DO NOT shave, including legs and underarms, starting the day of your first shower.   You may shave your face at any point before/day of surgery.  Place clean sheets on your bed the day you start using CHG soap. Use a clean washcloth (not used since being washed) for each shower. DO NOT sleep with pets once you start using the CHG.   CHG Shower Instructions:  Wash your face and private area with normal soap. If you choose to wash your hair, wash first with your normal shampoo.  After you use shampoo/soap, rinse your hair and body thoroughly to remove shampoo/soap residue.  Turn the water OFF and apply about 3 tablespoons (45 ml) of CHG soap to a CLEAN washcloth.  Apply CHG soap ONLY FROM YOUR NECK DOWN TO YOUR TOES (washing for 3-5 minutes)  DO NOT use CHG soap on face, private areas, open wounds, or sores.  Pay special attention to the area where your surgery is being performed.  If you are having back surgery, having someone wash your back for you may be helpful. Wait 2 minutes after CHG soap is applied, then you may rinse off the CHG soap.  Pat dry with a clean towel  Put on clean clothes/pajamas   If you choose to wear lotion, please use ONLY the CHG-compatible lotions that are listed below.  Additional instructions for the day of surgery: DO NOT APPLY any lotions, deodorants, cologne, or perfumes.   Do not bring valuables to the hospital. [facility] is not responsible for any belongings/valuables. Do not wear nail polish, gel polish, artificial nails, or any other type of covering on natural nails (fingers and toes) Do not wear jewelry or makeup Put on clean/comfortable clothes.  Please brush your teeth.  Ask your nurse before applying any prescription medications to the skin.     CHG Compatible Lotions   Aveeno Moisturizing lotion  Cetaphil Moisturizing Cream  Cetaphil Moisturizing Lotion  Clairol Herbal  Essence Moisturizing Lotion, Dry Skin  Clairol Herbal Essence Moisturizing Lotion, Extra Dry Skin  Clairol Herbal Essence Moisturizing Lotion, Normal Skin  Curel Age Defying Therapeutic Moisturizing Lotion with Alpha Hydroxy  Curel Extreme Care Body Lotion  Curel Soothing Hands Moisturizing Hand Lotion  Curel Therapeutic Moisturizing Cream, Fragrance-Free  Curel Therapeutic Moisturizing Lotion, Fragrance-Free  Curel Therapeutic Moisturizing Lotion, Original Formula  Eucerin Daily Replenishing Lotion  Eucerin Dry Skin Therapy Plus Alpha Hydroxy Crme  Eucerin Dry Skin Therapy Plus Alpha Hydroxy Lotion  Eucerin Original Crme  Eucerin Original Lotion  Eucerin Plus Crme Eucerin Plus Lotion  Eucerin TriLipid Replenishing Lotion  Keri Anti-Bacterial Hand Lotion  Keri Deep Conditioning Original Lotion Dry Skin Formula Softly Scented  Keri Deep Conditioning Original Lotion, Fragrance Free Sensitive Skin Formula  Keri Lotion Fast Absorbing Fragrance Free Sensitive Skin Formula  Keri Lotion Fast Absorbing Softly Scented Dry Skin Formula  Keri Original Lotion  Keri Skin Renewal Lotion  Keri Silky Smooth Lotion  Keri Silky Smooth Sensitive Skin Lotion  Nivea Body Creamy Conditioning Patent examiner Moisturizing Lotion Nivea Crme  Nivea Skin Firming Lotion  NutraDerm 30 Skin Lotion  NutraDerm Skin Lotion  NutraDerm Therapeutic Skin Cream  NutraDerm Therapeutic Skin Lotion  ProShield Protective Hand Cream  Provon moisturizing lotion  Please read over the following fact sheets that you were given.

## 2023-07-30 ENCOUNTER — Encounter (HOSPITAL_COMMUNITY)
Admission: RE | Admit: 2023-07-30 | Discharge: 2023-07-30 | Disposition: A | Discharge status: Home / Self Care | Source: Ambulatory Visit | Attending: Orthopaedic Surgery | Admitting: Orthopaedic Surgery

## 2023-07-30 ENCOUNTER — Encounter (HOSPITAL_COMMUNITY): Payer: Self-pay

## 2023-07-30 ENCOUNTER — Other Ambulatory Visit: Payer: Self-pay

## 2023-07-30 VITALS — BP 101/71 | HR 57 | Temp 97.8°F | Resp 16 | Ht 67.0 in | Wt 221.0 lb

## 2023-07-30 DIAGNOSIS — M3210 Systemic lupus erythematosus, organ or system involvement unspecified: Secondary | ICD-10-CM | POA: Diagnosis not present

## 2023-07-30 DIAGNOSIS — Z9081 Acquired absence of spleen: Secondary | ICD-10-CM | POA: Insufficient documentation

## 2023-07-30 DIAGNOSIS — G4733 Obstructive sleep apnea (adult) (pediatric): Secondary | ICD-10-CM | POA: Diagnosis not present

## 2023-07-30 DIAGNOSIS — N183 Chronic kidney disease, stage 3 unspecified: Secondary | ICD-10-CM | POA: Diagnosis not present

## 2023-07-30 DIAGNOSIS — E039 Hypothyroidism, unspecified: Secondary | ICD-10-CM | POA: Diagnosis not present

## 2023-07-30 DIAGNOSIS — D693 Immune thrombocytopenic purpura: Secondary | ICD-10-CM | POA: Insufficient documentation

## 2023-07-30 DIAGNOSIS — Z01812 Encounter for preprocedural laboratory examination: Secondary | ICD-10-CM | POA: Diagnosis present

## 2023-07-30 DIAGNOSIS — Z87891 Personal history of nicotine dependence: Secondary | ICD-10-CM | POA: Diagnosis not present

## 2023-07-30 DIAGNOSIS — Z01818 Encounter for other preprocedural examination: Secondary | ICD-10-CM

## 2023-07-30 DIAGNOSIS — M87051 Idiopathic aseptic necrosis of right femur: Secondary | ICD-10-CM | POA: Insufficient documentation

## 2023-07-30 DIAGNOSIS — Z951 Presence of aortocoronary bypass graft: Secondary | ICD-10-CM | POA: Diagnosis not present

## 2023-07-30 DIAGNOSIS — Z955 Presence of coronary angioplasty implant and graft: Secondary | ICD-10-CM | POA: Insufficient documentation

## 2023-07-30 DIAGNOSIS — I2510 Atherosclerotic heart disease of native coronary artery without angina pectoris: Secondary | ICD-10-CM | POA: Insufficient documentation

## 2023-07-30 DIAGNOSIS — I739 Peripheral vascular disease, unspecified: Secondary | ICD-10-CM | POA: Diagnosis not present

## 2023-07-30 DIAGNOSIS — I7121 Aneurysm of the ascending aorta, without rupture: Secondary | ICD-10-CM | POA: Diagnosis not present

## 2023-07-30 DIAGNOSIS — K219 Gastro-esophageal reflux disease without esophagitis: Secondary | ICD-10-CM | POA: Insufficient documentation

## 2023-07-30 DIAGNOSIS — R944 Abnormal results of kidney function studies: Secondary | ICD-10-CM | POA: Diagnosis not present

## 2023-07-30 DIAGNOSIS — Z79899 Other long term (current) drug therapy: Secondary | ICD-10-CM | POA: Diagnosis not present

## 2023-07-30 HISTORY — DX: Pneumonia, unspecified organism: J18.9

## 2023-07-30 HISTORY — DX: Unspecified osteoarthritis, unspecified site: M19.90

## 2023-07-30 LAB — TYPE AND SCREEN
ABO/RH(D): O POS
Antibody Screen: NEGATIVE

## 2023-07-30 LAB — BASIC METABOLIC PANEL WITH GFR
Anion gap: 7 (ref 5–15)
BUN: 22 mg/dL (ref 8–23)
CO2: 29 mmol/L (ref 22–32)
Calcium: 9 mg/dL (ref 8.9–10.3)
Chloride: 105 mmol/L (ref 98–111)
Creatinine, Ser: 1.2 mg/dL — ABNORMAL HIGH (ref 0.44–1.00)
GFR, Estimated: 50 mL/min — ABNORMAL LOW (ref >60)
Glucose, Bld: 76 mg/dL (ref 70–99)
Potassium: 3.9 mmol/L (ref 3.5–5.1)
Sodium: 141 mmol/L (ref 135–145)

## 2023-07-30 LAB — SURGICAL PCR SCREEN
MRSA, PCR: NEGATIVE
Staphylococcus aureus: NEGATIVE

## 2023-07-30 LAB — CBC
HCT: 43.3 % (ref 36.0–46.0)
Hemoglobin: 14.4 g/dL (ref 12.0–15.0)
MCH: 33.2 pg (ref 26.0–34.0)
MCHC: 33.3 g/dL (ref 30.0–36.0)
MCV: 99.8 fL (ref 80.0–100.0)
Platelets: 183 10*3/uL (ref 150–400)
RBC: 4.34 MIL/uL (ref 3.87–5.11)
RDW: 16.4 % — ABNORMAL HIGH (ref 11.5–15.5)
WBC: 3.2 10*3/uL — ABNORMAL LOW (ref 4.0–10.5)
nRBC: 0 % (ref 0.0–0.2)

## 2023-07-30 NOTE — Progress Notes (Signed)
 PCP - Dr Sandra Crouch  Cardiologist - Dr Chyrl Crawford Tobb (clearance in Epic on 07/07/23)  Chest x-ray - n/a EKG - 02/05/23 Stress Test - 01/11/14 ECHO - 03/11/22 Cardiac Cath - 02/26/21  ICD Pacemaker/Loop - n/a  Sleep Study -  Yes CPAP - does not use CPAP  Diabetes n/a  Stop Farxiga  72 hours prior to procedure.  Last dose will be on Friday, 08/07/23.  Aspirin /Plaquenil /Imuran  Instructions: Patient agreed to call MD for instructions on the above meds for her upcoming surgery on 08/11/23.  ERAS -  Clear liquids til 4:30 AM DOS.  Ensure Pre-Surgery drink was given at PAT appt. with instructions for day of surgery.     Anesthesia review: Yes  STOP now taking any Aspirin  (unless otherwise instructed by your surgeon), Aleve, Naproxen, Ibuprofen, Motrin, Advil, Goody's, BC's, all herbal medications, fish oil, and all vitamins.   Coronavirus Screening Do you have any of the following symptoms:  Cough yes/no: No Fever (>100.64F)  yes/no: No Runny nose yes/no: No Sore throat yes/no: No Difficulty breathing/shortness of breath  yes/no: No  Have you traveled in the last 14 days and where? yes/no: No  Patient verbalized understanding of instructions that were given to them at the PAT appointment. Patient was also instructed that they will need to review over the PAT instructions again at home before surgery.

## 2023-07-30 NOTE — Progress Notes (Signed)
 Surgical Instructions   Your procedure is scheduled on Tuesday, May 20th.. Report to [facility] "A" at 5:30 A.M., then check in with the Admitting office. Any questions or running late day of surgery: call [phone]  Questions prior to your surgery date: call [phone], Monday-Friday, 8am-4pm. If you experience any cold or flu symptoms such as cough, fever, chills, shortness of breath, etc. between now and your scheduled surgery, please notify us  at the above number.     Remember:  Do not eat after midnight the night before your surgery- Mon  You may drink clear liquids until 4:30 the morning of your surgery- Tues   Clear liquids allowed are: Water, Non-Citrus Juices (without pulp), Carbonated Beverages, Clear Tea (no milk, honey, etc.), Black Coffee Only (NO MILK, CREAM OR POWDERED CREAMER of any kind), and Gatorade.    Take these medicines the morning of surgery with A SIP OF WATER  isosorbide  mononitrate (IMDUR )  levothyroxine  (SYNTHROID )  metoprolol  succinate (TOPROL -XL)  pantoprazole  (PROTONIX )  pregabalin (LYRICA)  rosuvastatin  (CRESTOR )    May take these medicines IF NEEDED: albuterol  (VENTOLIN  HFA) inhaler - please bring with you on day of surgery  cyclobenzaprine  (FLEXERIL )  HYDROcodone -acetaminophen  (NORCO/VICODIN)  nitroGLYCERIN  (NITROSTAT ) - please call [phone] after taking this medication   Please complete your PRE-SURGERY ENSURE that was provided to you by  4:30 AM, the morning of surgery.  Please, if able, drink it in one setting. DO NOT SIP.  This will be the last thing that you will drink on morning of surgery.  Stop Farxiga  72 hours prior to procedure.  Last dose will be on  Friday, 08/07/23.   Follow your surgeon's instructions on when to stop Asprin, hydroxycloroquine (PLAQUENIL ) and Imuran .  If no instructions were given by your surgeon then you will need to call the office to get those instructions.     One week prior to surgery,  STOP taking any Aspirin  (unless otherwise instructed by your surgeon) Aleve, Naproxen, Ibuprofen, Motrin, Advil, Goody's, BC's, all herbal medications, fish oil, and non-prescription vitamins.                     Do NOT Smoke (Tobacco/Vaping) for 24 hours prior to your procedure.  If you use a CPAP at night, you may bring your mask/headgear for your overnight stay.   You will be asked to remove any contacts, glasses, piercing's, hearing aid's, dentures/partials prior to surgery. Please bring cases for these items if needed.    Patients discharged the day of surgery will not be allowed to drive home, and someone needs to stay with them for 24 hours.  SURGICAL WAITING ROOM VISITATION Patients may have no more than 2 support people in the waiting area - these visitors may rotate.   Pre-op nurse will coordinate an appropriate time for 1 ADULT support person, who may not rotate, to accompany patient in pre-op.  Children under the age of 62 must have an adult with them who is not the patient and must remain in the main waiting area with an adult.  If the patient needs to stay at the hospital during part of their recovery, the visitor guidelines for inpatient rooms apply.  Please refer to the [facility] website for the visitor guidelines for any additional information.   If you received a COVID test during your pre-op visit  it is requested that you wear a mask when out in public, stay away from anyone that may not be feeling  well and notify your surgeon if you develop symptoms. If you have been in contact with anyone that has tested positive in the last 10 days please notify you surgeon.      Pre-operative 5 CHG Bathing Instructions   You can play a key role in reducing the risk of infection after surgery. Your skin needs to be as free of germs as possible. You can reduce the number of germs on your skin by washing with CHG (chlorhexidine  gluconate) soap before surgery. CHG is an antiseptic  soap that kills germs and continues to kill germs even after washing.   DO NOT use if you have an allergy to chlorhexidine /CHG or antibacterial soaps. If your skin becomes reddened or irritated, stop using the CHG and notify one of our RNs at [phone].   Please shower with the CHG soap starting 4 days before surgery using the following schedule:     Please keep in mind the following:  DO NOT shave, including legs and underarms, starting the day of your first shower.   You may shave your face at any point before/day of surgery.  Place clean sheets on your bed the day you start using CHG soap. Use a clean washcloth (not used since being washed) for each shower. DO NOT sleep with pets once you start using the CHG.   CHG Shower Instructions:  Wash your face and private area with normal soap. If you choose to wash your hair, wash first with your normal shampoo.  After you use shampoo/soap, rinse your hair and body thoroughly to remove shampoo/soap residue.  Turn the water OFF and apply about 3 tablespoons (45 ml) of CHG soap to a CLEAN washcloth.  Apply CHG soap ONLY FROM YOUR NECK DOWN TO YOUR TOES (washing for 3-5 minutes)  DO NOT use CHG soap on face, private areas, open wounds, or sores.  Pay special attention to the area where your surgery is being performed.  If you are having back surgery, having someone wash your back for you may be helpful. Wait 2 minutes after CHG soap is applied, then you may rinse off the CHG soap.  Pat dry with a clean towel  Put on clean clothes/pajamas   If you choose to wear lotion, please use ONLY the CHG-compatible lotions that are listed below.  Additional instructions for the day of surgery: DO NOT APPLY any lotions, deodorants, cologne, or perfumes.   Do not bring valuables to the hospital. [facility] is not responsible for any belongings/valuables. Do not wear nail polish, gel polish, artificial nails, or any other type of covering on natural  nails (fingers and toes) Do not wear jewelry or makeup Put on clean/comfortable clothes.  Please brush your teeth.  Ask your nurse before applying any prescription medications to the skin.     CHG Compatible Lotions   Aveeno Moisturizing lotion  Cetaphil Moisturizing Cream  Cetaphil Moisturizing Lotion  Clairol Herbal Essence Moisturizing Lotion, Dry Skin  Clairol Herbal Essence Moisturizing Lotion, Extra Dry Skin  Clairol Herbal Essence Moisturizing Lotion, Normal Skin  Curel Age Defying Therapeutic Moisturizing Lotion with Alpha Hydroxy  Curel Extreme Care Body Lotion  Curel Soothing Hands Moisturizing Hand Lotion  Curel Therapeutic Moisturizing Cream, Fragrance-Free  Curel Therapeutic Moisturizing Lotion, Fragrance-Free  Curel Therapeutic Moisturizing Lotion, Original Formula  Eucerin Daily Replenishing Lotion  Eucerin Dry Skin Therapy Plus Alpha Hydroxy Crme  Eucerin Dry Skin Therapy Plus Alpha Hydroxy Lotion  Eucerin Original Crme  Eucerin Original Lotion  Eucerin  Plus Crme Eucerin Plus Lotion  Eucerin TriLipid Replenishing Lotion  Keri Anti-Bacterial Hand Lotion  Keri Deep Conditioning Original Lotion Dry Skin Formula Softly Scented  Keri Deep Conditioning Original Lotion, Fragrance Free Sensitive Skin Formula  Keri Lotion Fast Absorbing Fragrance Free Sensitive Skin Formula  Keri Lotion Fast Absorbing Softly Scented Dry Skin Formula  Keri Original Lotion  Keri Skin Renewal Lotion Keri Silky Smooth Lotion  Keri Silky Smooth Sensitive Skin Lotion  Nivea Body Creamy Conditioning Oil  Nivea Body Extra Enriched Lotion  Nivea Body Original Lotion  Nivea Body Sheer Moisturizing Lotion Nivea Crme  Nivea Skin Firming Lotion  NutraDerm 30 Skin Lotion  NutraDerm Skin Lotion  NutraDerm Therapeutic Skin Cream  NutraDerm Therapeutic Skin Lotion  ProShield Protective Hand Cream  Provon moisturizing lotion  Please read over the following fact sheets that you were  given.

## 2023-07-31 NOTE — Progress Notes (Signed)
 Anesthesia Chart Review:  68 year old female follows with cardiology for history of lupus erythematous (in remission), coronary artery disease with prior history of stenting and ultimately four-vessel bypass in 2018, mitral valve annuloplasty at the time of her CABG in 2018, postoperative atrial fibrillation, peripheral artery disease and ascending aortic aneurysm.  Cath 02/2019 showed CTO of proximal RCA with collaterals from the LAD and left circumflex, patent LIMA to LAD, patent SVG to the 2 large obtuse marginal branches, no intervention at that time, antianginal therapy recommended.  Echo 02/2022 showed EF 50 to 55%, normal RV function, left atrium severely dilated, mild mitral regurgitation.  Last seen by Dr. Emmette Harms on 07/07/2023 for preop eval.  Per note, "The patient does not have any unstable cardiac conditions.  Upon evaluation today, she can achieve 4 METs or greater without anginal symptoms.  According to [facility] and AHA guidelines, she requires no further cardiac workup prior to her noncardiac surgery and should be at acceptable risk.  Our service is available as necessary in the perioperative period."  Other pertinent history includes former smoker (30 pack years, quit 2017), PONV, GERD on PPI, hypothyroid, OSA not on CPAP, CKD 3, immune thrombocytopenic purpura s/p splenectomy.  Preop labs reviewed, creatinine mildly elevated 1.20, otherwise unremarkable.  EKG 02/05/2023: Normal sinus rhythm.  Rate 65. Low voltage QRS  TTE 03/11/22: 1. Left ventricular ejection fraction, by estimation, is 50 to 55%. The  left ventricle has low normal function. The left ventricle has no regional  wall motion abnormalities. Left ventricular diastolic function could not  be evaluated. Elevated left  ventricular end-diastolic pressure.   2. Right ventricular systolic function is normal. The right ventricular  size is normal. There is normal pulmonary artery systolic pressure.   3. Left atrial size was severely  dilated.   4. Right atrial size was mildly dilated.   5. The mitral valve has been repaired/replaced. Mild mitral valve  regurgitation. No evidence of mitral stenosis. The mean mitral valve  gradient is 4.0 mmHg. There is a prosthetic annuloplasty ring present in  the mitral position.   6. Tricuspid valve regurgitation is mild to moderate.   7. The aortic valve is tricuspid. Aortic valve regurgitation is mild. No  aortic stenosis is present. Aortic regurgitation PHT measures 603 msec.   8. Aortic dilatation noted. There is mild dilatation of the aortic root,  measuring 36 mm. There is mild dilatation of the ascending aorta,  measuring 42 mm.   9. The inferior vena cava is normal in size with greater than 50%  respiratory variability, suggesting right atrial pressure of 3 mmHg.   Cath 02/27/2019: Total occlusion of the proximal native RCA which receives collaterals from the LAD and left circumflex territory. Patent left main Focal 90% stenosis in the proximal LAD.  LAD fills competitively because of patent LIMA to the mid vessel. Diffuse disease in the proximal and mid circumflex with patent saphenous vein graft to the 2 large obtuse marginal branches. Widely patent LIMA to LAD. Widely patent sequential saphenous vein graft to the first and second obtuse marginal branches. Total occlusion at aorto-ostial junction, appearing chronic.      [name] [facility] Short Stay Center/Anesthesiology Phone [phone] 07/31/2023 3:02 PM

## 2023-07-31 NOTE — Anesthesia Preprocedure Evaluation (Addendum)
 Anesthesia Evaluation  Patient identified by MRN, date of birth, ID band Patient awake    Reviewed: Allergy & Precautions, H&P , NPO status , Patient's Chart, lab work & pertinent test results  History of Anesthesia Complications (+) PONV and history of anesthetic complications  Airway Mallampati: I  TM Distance: >3 FB Neck ROM: Full    Dental no notable dental hx. (+) Edentulous Upper, Edentulous Lower,    Pulmonary sleep apnea , pneumonia, former smoker   Pulmonary exam normal breath sounds clear to auscultation       Cardiovascular Exercise Tolerance: Good hypertension, + angina  + CAD and + Past MI  Normal cardiovascular exam Rhythm:Regular Rate:Normal     Neuro/Psych  Neuromuscular disease negative neurological ROS  negative psych ROS   GI/Hepatic negative GI ROS, Neg liver ROS,GERD  ,,  Endo/Other  negative endocrine ROSHypothyroidism    Renal/GU Renal diseasenegative Renal ROS  negative genitourinary   Musculoskeletal negative musculoskeletal ROS (+) Arthritis ,    Abdominal   Peds negative pediatric ROS (+)  Hematology negative hematology ROS (+) Blood dyscrasia, anemia   Anesthesia Other Findings   Reproductive/Obstetrics negative OB ROS                              Anesthesia Physical Anesthesia Plan  ASA: 3  Anesthesia Plan: MAC and Spinal   Post-op Pain Management: Minimal or no pain anticipated, Tylenol  PO (pre-op)* and Celebrex PO (pre-op)*   Induction: Intravenous  PONV Risk Score and Plan: 3 and Propofol  infusion  Airway Management Planned: Mask, Natural Airway and Simple Face Mask  Additional Equipment: None  Intra-op Plan:   Post-operative Plan:   Informed Consent: I have reviewed the patients History and Physical, chart, labs and discussed the procedure including the risks, benefits and alternatives for the proposed anesthesia with the patient or  authorized representative who has indicated his/her understanding and acceptance.       Plan Discussed with: Anesthesiologist  Anesthesia Plan Comments: (PAT note by Rudy Costain, PA-C: 68 year old female follows with cardiology for history of lupus erythematous (in remission), coronary artery disease with prior history of stenting and ultimately four-vessel bypass in 2018, mitral valve annuloplasty at the time of her CABG in 2018, postoperative atrial fibrillation, peripheral artery disease and ascending aortic aneurysm.  Cath 02/2019 showed CTO of proximal RCA with collaterals from the LAD and left circumflex, patent LIMA to LAD, patent SVG to the 2 large obtuse marginal branches, no intervention at that time, antianginal therapy recommended.  Echo 02/2022 showed EF 50 to 55%, normal RV function, left atrium severely dilated, mild mitral regurgitation.  Last seen by Dr. Emmette Harms on 07/07/2023 for preop eval.  Per note, "The patient does not have any unstable cardiac conditions.  Upon evaluation today, she can achieve 4 METs or greater without anginal symptoms.  According to [facility] and AHA guidelines, she requires no further cardiac workup prior to her noncardiac surgery and should be at acceptable risk.  Our service is available as necessary in the perioperative period."  Other pertinent history includes former smoker (30 pack years, quit 2017), PONV, GERD on PPI, hypothyroid, OSA not on CPAP, CKD 3, immune thrombocytopenic purpura s/p splenectomy.  Preop labs reviewed, creatinine mildly elevated 1.20, otherwise unremarkable.  EKG 02/05/2023: Normal sinus rhythm.  Rate 65. Low voltage QRS  TTE 03/11/22: 1. Left ventricular ejection fraction, by estimation, is 50 to 55%. The  left ventricle has  low normal function. The left ventricle has no regional  wall motion abnormalities. Left ventricular diastolic function could not  be evaluated. Elevated left  ventricular end-diastolic pressure.   2. Right  ventricular systolic function is normal. The right ventricular  size is normal. There is normal pulmonary artery systolic pressure.   3. Left atrial size was severely dilated.   4. Right atrial size was mildly dilated.   5. The mitral valve has been repaired/replaced. Mild mitral valve  regurgitation. No evidence of mitral stenosis. The mean mitral valve  gradient is 4.0 mmHg. There is a prosthetic annuloplasty ring present in  the mitral position.   6. Tricuspid valve regurgitation is mild to moderate.   7. The aortic valve is tricuspid. Aortic valve regurgitation is mild. No  aortic stenosis is present. Aortic regurgitation PHT measures 603 msec.   8. Aortic dilatation noted. There is mild dilatation of the aortic root,  measuring 36 mm. There is mild dilatation of the ascending aorta,  measuring 42 mm.   9. The inferior vena cava is normal in size with greater than 50%  respiratory variability, suggesting right atrial pressure of 3 mmHg.   Cath 02/27/2019:  Total occlusion of the proximal native RCA which receives collaterals from the LAD and left circumflex territory.  Patent left main  Focal 90% stenosis in the proximal LAD.  LAD fills competitively because of patent LIMA to the mid vessel.  Diffuse disease in the proximal and mid circumflex with patent saphenous vein graft to the 2 large obtuse marginal branches.  Widely patent LIMA to LAD.  Widely patent sequential saphenous vein graft to the first and second obtuse marginal branches.  Total occlusion at aorto-ostial junction, appearing chronic.    )         Anesthesia Quick Evaluation

## 2023-08-04 ENCOUNTER — Other Ambulatory Visit: Payer: Self-pay | Admitting: Internal Medicine

## 2023-08-04 DIAGNOSIS — E039 Hypothyroidism, unspecified: Secondary | ICD-10-CM

## 2023-08-06 ENCOUNTER — Ambulatory Visit (INDEPENDENT_AMBULATORY_CARE_PROVIDER_SITE_OTHER): Admitting: Family Medicine

## 2023-08-06 ENCOUNTER — Encounter (INDEPENDENT_AMBULATORY_CARE_PROVIDER_SITE_OTHER): Payer: Self-pay | Admitting: Family Medicine

## 2023-08-06 VITALS — BP 112/72 | HR 53 | Temp 97.7°F | Ht 64.5 in | Wt 219.0 lb

## 2023-08-06 DIAGNOSIS — E559 Vitamin D deficiency, unspecified: Secondary | ICD-10-CM | POA: Diagnosis not present

## 2023-08-06 DIAGNOSIS — Z6837 Body mass index (BMI) 37.0-37.9, adult: Secondary | ICD-10-CM

## 2023-08-06 DIAGNOSIS — E88819 Insulin resistance, unspecified: Secondary | ICD-10-CM

## 2023-08-06 DIAGNOSIS — E669 Obesity, unspecified: Secondary | ICD-10-CM

## 2023-08-06 DIAGNOSIS — Z6841 Body Mass Index (BMI) 40.0 and over, adult: Secondary | ICD-10-CM

## 2023-08-06 MED ORDER — METFORMIN HCL 500 MG PO TABS: 500.0000 mg | ORAL_TABLET | Freq: Two times a day (BID) | ORAL | 0 refills | Status: DC

## 2023-08-06 NOTE — Progress Notes (Signed)
 Office: [phone]  /  Fax: [phone]  WEIGHT SUMMARY AND BIOMETRICS  Anthropometric Measurements Height: 5' 4.5" (1.638 m) (rechecked height today) Weight: 219 lb (99.3 kg) BMI (Calculated): 37.02 Weight at Last Visit: 217 lb Weight Lost Since Last Visit: 2 lb Weight Gained Since Last Visit: 0 Starting Weight: 258 lb Total Weight Loss (lbs): 39 lb (17.7 kg) Peak Weight: 284 lb   Body Composition  Body Fat %: 50.1 % Fat Mass (lbs): 110.2 lbs Muscle Mass (lbs): 104 lbs Total Body Water (lbs): 80.2 lbs Visceral Fat Rating : 16   Other Clinical Data Fasting: no Labs: no Today's Visit #: 28 Starting Date: 04/09/21 Comments: rechecked height today    Chief Complaint: OBESITY    History of Present Illness [name] is a 68 year old female who presents for a follow-up on her obesity treatment and progress.  She has been adhering to a category one eating plan with approximately 75% compliance, resulting in a weight loss of two pounds over the past three months. She engages in cardiovascular and weight exercises for 30 to 60 minutes twice weekly. Despite challenges with stress eating, she maintains a high protein diet, including chicken and protein drinks.  She is preparing for hip replacement surgery, with the right hip scheduled for Tuesday and the left in August. She has been cleared by cardiology for the surgery and plans to have physical therapy at home post-surgery. She ensures adequate hydration, which has improved her renal function. She plans to take a week off work for her surgery, with a friend assisting with cooking and dietary needs. She does not expect to receive food from family or friends post-surgery due to dietary restrictions.  She has been using Mounjaro  without effect and is interested in switching to metformin, which she has not used before. She is aware of potential side effects, such as queasiness if taken on an empty stomach.  She has  not taken vitamin D  supplements for three to four months due to previously high levels and will have her vitamin D  levels checked to determine if supplementation is needed.      PHYSICAL EXAM:  Blood pressure 112/72, pulse (!) 53, temperature 97.7 F (36.5 C), height 5' 4.5" (1.638 m), weight 219 lb (99.3 kg), SpO2 98%. Body mass index is 37.01 kg/m.  DIAGNOSTIC DATA REVIEWED:  BMET    Component Value Date/Time   NA 141 07/30/2023 1430   NA 141 03/19/2023 0000   K 3.9 07/30/2023 1430   CL 105 07/30/2023 1430   CO2 29 07/30/2023 1430   GLUCOSE 76 07/30/2023 1430   BUN 22 07/30/2023 1430   BUN 18 03/19/2023 0000   CREATININE 1.20 (H) 07/30/2023 1430   CREATININE 1.17 (H) 02/22/2021 1501   CALCIUM  9.0 07/30/2023 1430   GFRNONAA 50 (L) 07/30/2023 1430   GFRAA 52 (L) 08/04/2018 0921   Lab Results  Component Value Date   HGBA1C 5.3 03/19/2023   HGBA1C 5.6 01/31/2017   Lab Results  Component Value Date   INSULIN  8.3 09/11/2022   INSULIN  17.2 04/09/2021   Lab Results  Component Value Date   TSH 1.50 03/19/2023   CBC    Component Value Date/Time   WBC 3.2 (L) 07/30/2023 1430   RBC 4.34 07/30/2023 1430   HGB 14.4 07/30/2023 1430   HCT 43.3 07/30/2023 1430   PLT 183 07/30/2023 1430   MCV 99.8 07/30/2023 1430   MCH 33.2 07/30/2023 1430   MCHC 33.3 07/30/2023  1430   RDW 16.4 (H) 07/30/2023 1430   Iron Studies    Component Value Date/Time   IRON 386 06/23/2017 0000   TIBC 285 06/23/2017 0000   FERRITIN 58 06/23/2017 0000   IRONPCTSAT 26 06/23/2017 0000   Lipid Panel     Component Value Date/Time   CHOL 130 03/19/2023 0000   CHOL 134 04/23/2022 1456   TRIG 47 03/19/2023 0000   HDL 49 03/19/2023 0000   HDL 55 04/23/2022 1456   CHOLHDL 3 03/02/2023 1552   VLDL 12.0 03/02/2023 1552   LDLCALC 64 03/19/2023 0000   LDLCALC 67 04/23/2022 1456   LDLCALC 68 11/14/2019 1537   LDLDIRECT 321.8 02/27/2012 1202   Hepatic Function Panel     Component Value  Date/Time   PROT 7.2 03/02/2023 1552   PROT 7.2 04/23/2022 1456   ALBUMIN  4.3 03/19/2023 0000   ALBUMIN  4.2 04/23/2022 1456   AST 31 03/19/2023 0000   ALT 22 03/19/2023 0000   ALKPHOS 101 03/19/2023 0000   BILITOT 0.6 03/02/2023 1552   BILITOT 0.4 04/23/2022 1456   BILIDIR 0.2 03/19/2023 0000   BILIDIR 0.10 11/03/2016 1155   IBILI 0.2 11/14/2019 1537      Component Value Date/Time   TSH 1.50 03/19/2023 0000   TSH 1.06 03/02/2023 1552   Nutritional Lab Results  Component Value Date   VD25OH 90.6 03/19/2023   VD25OH 60.7 09/11/2022   VD25OH 51.6 04/23/2022     Assessment and Plan Assessment & Plan Obesity Obesity management with a focus on weight loss and lifestyle modification. She follows a category one eating plan approximately 75% of the time and has lost 2 pounds since the last visit. Engages in cardio and weight exercises 30-60 minutes twice a week. Preparing for upcoming hip surgery, which may impact dietary habits. Discussed the importance of protein intake for surgical recovery and strategies to maintain diet post-surgery. - Continue category one eating plan. - Continue cardio and weight exercises 30-60 minutes twice a week. - Encourage protein intake to aid in surgical recovery. - Discuss potential challenges in maintaining diet post-surgery and strategies to mitigate them.  Insulin  resistance Current treatment with Mounjaro  deemed ineffective. Decision made to switch to metformin due to its effectiveness, safety profile, and cost-effectiveness. Discussed the mechanism of action of metformin and its common side effects, including potential gastrointestinal upset if taken on an empty stomach. Recommended starting with a lower dose to assess tolerance. - Discontinue Mounjaro . - Initiate metformin 500 mg once daily for the first week, then increase to 500 mg twice daily if tolerated. - Instruct to take metformin with food to minimize gastrointestinal side effects. -  Provide handout on metformin.  Vitamin D  monitoring Previous vitamin D  levels were elevated, leading to discontinuation of supplementation. Plan to reassess vitamin D  levels to ensure she is within the optimal range for bone health, especially important post-surgery. Discussed the importance of vitamin D  in calcium  absorption and bone recovery post-surgery. - Order vitamin D  level test. - Advise to resume vitamin D  supplementation if levels are below the optimal range (30-40 ng/mL).    She was informed of the importance of frequent follow up visits to maximize her success with intensive lifestyle modifications for her multiple health conditions.    Jasmine Mesi, MD

## 2023-08-07 LAB — VITAMIN D 25 HYDROXY (VIT D DEFICIENCY, FRACTURES): Vit D, 25-Hydroxy: 36.7 ng/mL (ref 30.0–100.0)

## 2023-08-10 ENCOUNTER — Telehealth: Payer: Self-pay

## 2023-08-10 NOTE — Telephone Encounter (Signed)
 Patient aware of the below message

## 2023-08-10 NOTE — Telephone Encounter (Signed)
 Patient left voice mail on triage phone and someone transferred it to me.  Patient is scheduled for surgery tomorrow and said pre-op told her to call here about several medications.  Needs instructions about Plaquenil , Lasix  and Imuran .  Please call [phone].

## 2023-08-10 NOTE — H&P (Signed)
 TOTAL HIP ADMISSION H&P  Patient is admitted for right total hip arthroplasty.  Subjective:  Chief Complaint: right hip pain  HPI: [name], 68 y.o. female, has a history of pain and functional disability in the right hip(s) due to avascular necrosis and patient has failed non-surgical conservative treatments for greater than 12 weeks to include NSAID's and/or analgesics, use of assistive devices, weight reduction as appropriate, and activity modification.  Onset of symptoms was abrupt starting  about 7 months ago with rapidlly worsening course since that time.The patient noted no past surgery on the right hip(s).  Patient currently rates pain in the right hip at 10 out of 10 with activity. Patient has night pain, worsening of pain with activity and weight bearing, pain that interfers with activities of daily living, and pain with passive range of motion. Patient has evidence of AVN by imaging studies. This condition presents safety issues increasing the risk of falls. This patient has had avascular necrosis of the hip, acetabular fracture, hip dysplasia.  There is no current active infection.  Patient Active Problem List   Diagnosis Date Noted   Avascular necrosis of bone of right hip (HCC) 06/29/2023   Avascular necrosis of bone of left hip (HCC) 06/29/2023   Need for prophylactic vaccination with combined diphtheria-tetanus-pertussis (DTP) vaccine 03/02/2023   Need for RSV vaccination 03/02/2023   Vitamin B deficiency 06/26/2022   BMI 35.0-35.9,adult 06/26/2022   Drug-induced constipation 06/26/2022   Essential hypertension 06/26/2022   Allergic rhinitis 06/08/2022   Other hyperlipidemia 04/23/2022   Morbid obesity (HCC)-starting bmi 41.64 04/23/2022   BMI 36.0-36.9,adult-current bmi 04/23/2022   Insulin  resistance 03/25/2022   LRTI (lower respiratory tract infection) 01/24/2022   Vitamin D  deficiency 06/17/2021   Need for shingles vaccine 04/08/2021   Encounter for general  adult medical examination with abnormal findings 04/08/2021   Chronic heart failure with preserved ejection fraction (HCC) 04/03/2021   Hyperlipidemia LDL goal <70 04/03/2021   Screen for colon cancer 04/03/2021   Systemic lupus erythematosus (HCC) 02/25/2021   Stage 3a chronic kidney disease (HCC)    Hypertension 08/08/2020   Chronic idiopathic constipation 05/31/2020   Class 3 severe obesity with serious comorbidity and body mass index (BMI) of 40.0 to 44.9 in adult 05/31/2020   Postoperative atrial fibrillation (HCC) 02/18/2017   Pain in right hip 07/21/2016   Osteoarthritis of spine with radiculopathy, lumbar region 06/24/2016   Hypothyroidism 04/08/2011   Coronary artery disease involving coronary bypass graft of native heart with angina pectoris (HCC) 02/26/2009   Gastroesophageal reflux disease 08/05/2007   Systemic lupus erythematosus with focal and segmental proliferative glomerulonephritis (HCC) 08/05/2007   Past Medical History:  Diagnosis Date   Arthritis    Back pain    Bilateral swelling of feet and ankles    no current problems per pt 07/30/23   CAD (coronary artery disease)    a. remote stents/angioplasty. b. s/p CABG 01/2017 with mitral valve annuloplasty.   Chest pain    Constipation    Diastolic heart failure (HCC)    Esophageal reflux    Gallbladder problem    GERD (gastroesophageal reflux disease)    History of MI (myocardial infarction)    Hyperlipidemia    Hypertension    Hypothyroidism    Immune thrombocytopenic purpura (HCC)    Joint pain    Lupus erythematosus    in remission since 2013   Mitral valve insufficiency and aortic valve insufficiency    Myocardial infarction (HCC)  x 2   Pneumonia    x several   PONV (postoperative nausea and vomiting)    Postoperative atrial fibrillation (HCC)    Renal disease    stage 3   Severe mitral regurgitation    a. s/p MV annuloplasty 01/2017 at time of CABG.   Shortness of breath on exertion    no  current problems - lost weight, stop smoking and hgb wnl in 02/2023   Sleep apnea    does not use CPAP - patient states "mild", lost weight   Status post mitral valve annuloplasty    Unspecified disease of pericardium     Past Surgical History:  Procedure Laterality Date   BREAST BIOPSY Right 06/03/2023   MM RT BREAST BX W LOC DEV 1ST LESION IMAGE BX SPEC STEREO GUIDE 06/03/2023 [facility] MAMMOGRAPHY   CHOLECYSTECTOMY, LAPAROSCOPIC  2010   CORONARY ARTERY BYPASS GRAFT N/A 01/30/2017   Procedure: CORONARY ARTERY BYPASS GRAFTING (CABG), Times four  using the right saphaneous vein, harvested endoscopicly.  and left internal mammary artery .;  Surgeon: Bartley Lightning, MD;  Location: [facility] OR;  Service: Open Heart Surgery;  Laterality: N/A;   LEFT HEART CATH AND CORONARY ANGIOGRAPHY N/A 01/23/2017   Procedure: LEFT HEART CATH AND CORONARY ANGIOGRAPHY;  Surgeon: Arty Binning, MD;  Location: [facility] INVASIVE CV LAB;  Service: Cardiovascular;  Laterality: N/A;   MITRAL VALVE REPAIR N/A 01/30/2017   Procedure: MITRAL VALVE REPAIR (MVR);  Surgeon: Bartley Lightning, MD;  Location: [facility] OR;  Service: Open Heart Surgery;  Laterality: N/A;   plastic surgical repair      dog bite to leg   RIGHT/LEFT HEART CATH AND CORONARY/GRAFT ANGIOGRAPHY N/A 02/26/2021   Procedure: RIGHT/LEFT HEART CATH AND CORONARY/GRAFT ANGIOGRAPHY;  Surgeon: Arty Binning, MD;  Location: [facility] INVASIVE CV LAB;  Service: Cardiovascular;  Laterality: N/A;   SPLENECTOMY  5-04   TEE WITHOUT CARDIOVERSION N/A 01/27/2017   Procedure: TRANSESOPHAGEAL ECHOCARDIOGRAM (TEE);  Surgeon: Luana Rumple, MD;  Location: [facility] ENDOSCOPY;  Service: Cardiovascular;  Laterality: N/A;   TEE WITHOUT CARDIOVERSION N/A 01/30/2017   Procedure: TRANSESOPHAGEAL ECHOCARDIOGRAM (TEE);  Surgeon: Bartley Lightning, MD;  Location: [facility] OR;  Service: Open Heart Surgery;  Laterality: N/A;   ULTRASOUND GUIDANCE FOR VASCULAR ACCESS  01/23/2017   Procedure: Ultrasound Guidance For Vascular  Access;  Surgeon: Arty Binning, MD;  Location: [facility] INVASIVE CV LAB;  Service: Cardiovascular;;    No current facility-administered medications for this encounter.   Current Outpatient Medications  Medication Sig Dispense Refill Last Dose/Taking   albuterol  (VENTOLIN  HFA) 108 (90 Base) MCG/ACT inhaler Inhale 2 puffs into the lungs every 6 (six) hours as needed for wheezing or shortness of breath. 8 g 0 Taking As Needed   aspirin  81 MG chewable tablet Chew 1 tablet (81 mg total) by mouth daily.   Taking   azaTHIOprine  (IMURAN ) 50 MG tablet TAKE 1 TABLET BY MOUTH IN THE MORNING AND IN THE EVENING 180 tablet 0 Taking   dapagliflozin  propanediol (FARXIGA ) 10 MG TABS tablet Take 1 tablet (10 mg total) by mouth daily. 90 tablet 3 Taking   furosemide  (LASIX ) 20 MG tablet TAKE 1 TABLET BY MOUTH EVERY DAY 90 tablet 3 Taking   HYDROcodone -acetaminophen  (NORCO/VICODIN) 5-325 MG tablet Take 1 tablet by mouth every 6 (six) hours as needed for moderate pain (pain score 4-6). 30 tablet 0 Taking As Needed   hydroxychloroquine  (PLAQUENIL ) 200 MG tablet Take 200 mg by mouth 2 (two) times daily.  Taking   isosorbide  mononitrate (IMDUR ) 30 MG 24 hr tablet Take 1 tablet (30 mg total) by mouth daily. 90 tablet 3 Taking   metoprolol  succinate (TOPROL -XL) 50 MG 24 hr tablet Take 1 tablet (50 mg total) by mouth daily. 90 tablet 3 Taking   nitroGLYCERIN  (NITROSTAT ) 0.4 MG SL tablet Place 1 tablet (0.4 mg total) under the tongue every 5 (five) minutes as needed for chest pain. 25 tablet 3 Taking As Needed   pantoprazole  (PROTONIX ) 40 MG tablet Take 1 tablet (40 mg total) by mouth daily. 90 tablet 3 Taking   polyethylene glycol powder (GLYCOLAX /MIRALAX ) 17 GM/SCOOP powder 17 gram po BID prn constipation 850 g 0 Taking   potassium chloride  SA (KLOR-CON  M) 20 MEQ tablet Take 1 tablet (20 mEq total) by mouth daily. 30 tablet 0 Taking   pregabalin (LYRICA) 50 MG capsule Take 50 mg by mouth 2 (two) times daily as needed (pain).    Taking As Needed   rosuvastatin  (CRESTOR ) 20 MG tablet Take 1 tablet (20 mg total) by mouth daily. 90 tablet 3 Taking   cyclobenzaprine  (FLEXERIL ) 5 MG tablet Take 1 tablet (5 mg total) by mouth 3 (three) times daily as needed. 30 tablet 0    fluticasone  (FLONASE ) 50 MCG/ACT nasal spray SPRAY 2 SPRAYS INTO EACH NOSTRIL EVERY DAY 48 mL 2 Not Taking   levothyroxine  (SYNTHROID ) 75 MCG tablet TAKE 1 TABLET BY MOUTH DAILY BEFORE BREAKFAST. 90 tablet 0    metFORMIN  (GLUCOPHAGE ) 500 MG tablet Take 1 tablet (500 mg total) by mouth 2 (two) times daily with a meal. 60 tablet 0    tirzepatide  (MOUNJARO ) 10 MG/0.5ML Pen Inject 10 mg into the skin once a week. 2 mL 0 Not Taking   Allergies  Allergen Reactions   Amlodipine  Swelling    Leg edema   Cellcept  [Mycophenolate ] Nausea Only    Social History   Tobacco Use   Smoking status: Former    Current packs/day: 0.00    Average packs/day: 1 pack/day for 30.0 years (30.0 ttl pk-yrs)    Types: Cigarettes    Start date: 03/23/1986    Quit date: 12/23/2015    Years since quitting: 7.6    Passive exposure: Past   Smokeless tobacco: Never  Substance Use Topics   Alcohol use: No    Family History  Problem Relation Age of Onset   Heart disease Mother    Hyperlipidemia Mother    Cancer Mother        breast and cervical   Paget's disease of bone Mother    Fibromyalgia Mother    Breast cancer Mother    Hypertension Father    Heart disease Father        PVD   GER disease Father    Breast cancer Sister    Heart attack Maternal Grandmother    Cancer Paternal Grandfather        colon     Review of Systems  Objective:  Physical Exam Vitals reviewed.  Constitutional:      Appearance: Normal appearance. She is obese.  HENT:     Head: Normocephalic and atraumatic.  Eyes:     Extraocular Movements: Extraocular movements intact.     Pupils: Pupils are equal, round, and reactive to light.  Cardiovascular:     Rate and Rhythm: Normal rate.   Pulmonary:     Effort: Pulmonary effort is normal.     Breath sounds: Normal breath sounds.  Abdominal:  Palpations: Abdomen is soft.  Musculoskeletal:     Cervical back: Normal range of motion and neck supple.     Right hip: Tenderness and bony tenderness present. Decreased range of motion. Decreased strength.  Neurological:     Mental Status: She is alert and oriented to person, place, and time.  Psychiatric:        Behavior: Behavior normal.     Vital signs in last 24 hours:    Labs:   Estimated body mass index is 37.01 kg/m as calculated from the following:   Height as of 08/06/23: 5' 4.5" (1.638 m).   Weight as of 08/06/23: 99.3 kg.   Imaging Review MRI demonstrates significant AVN of the right hip(s). The bone quality appears to be excellent for age and reported activity level.      Assessment/Plan:  Avascular necrosis right hip(s)  The patient history, physical examination, clinical judgement of the provider and imaging studies are consistent with end stage degenerative joint disease of the right hip(s) and total hip arthroplasty is deemed medically necessary. The treatment options including medical management, injection therapy, arthroscopy and arthroplasty were discussed at length. The risks and benefits of total hip arthroplasty were presented and reviewed. The risks due to aseptic loosening, infection, stiffness, dislocation/subluxation,  thromboembolic complications and other imponderables were discussed.  The patient acknowledged the explanation, agreed to proceed with the plan and consent was signed. Patient is being admitted for inpatient treatment for surgery, pain control, PT, OT, prophylactic antibiotics, VTE prophylaxis, progressive ambulation and ADL's and discharge planning.The patient is planning to be discharged home with home health services

## 2023-08-11 ENCOUNTER — Observation Stay (HOSPITAL_COMMUNITY)

## 2023-08-11 ENCOUNTER — Ambulatory Visit (HOSPITAL_COMMUNITY): Payer: Self-pay | Admitting: Physician Assistant

## 2023-08-11 ENCOUNTER — Ambulatory Visit (HOSPITAL_COMMUNITY): Payer: Self-pay | Admitting: Anesthesiology

## 2023-08-11 ENCOUNTER — Other Ambulatory Visit: Payer: Self-pay

## 2023-08-11 ENCOUNTER — Observation Stay (HOSPITAL_COMMUNITY)
Admission: RE | Admit: 2023-08-11 | Discharge: 2023-08-12 | Disposition: A | Discharge status: Home / Self Care | Attending: Orthopaedic Surgery | Admitting: Orthopaedic Surgery

## 2023-08-11 ENCOUNTER — Ambulatory Visit (HOSPITAL_COMMUNITY)

## 2023-08-11 ENCOUNTER — Encounter (HOSPITAL_COMMUNITY): Payer: Self-pay | Admitting: Orthopaedic Surgery

## 2023-08-11 ENCOUNTER — Encounter (HOSPITAL_COMMUNITY)
Admission: RE | Disposition: A | Discharge status: Home / Self Care | Payer: Self-pay | Source: Home / Self Care | Attending: Orthopaedic Surgery

## 2023-08-11 DIAGNOSIS — I5032 Chronic diastolic (congestive) heart failure: Secondary | ICD-10-CM | POA: Diagnosis not present

## 2023-08-11 DIAGNOSIS — I2510 Atherosclerotic heart disease of native coronary artery without angina pectoris: Secondary | ICD-10-CM | POA: Diagnosis not present

## 2023-08-11 DIAGNOSIS — Z96641 Presence of right artificial hip joint: Secondary | ICD-10-CM

## 2023-08-11 DIAGNOSIS — Z7902 Long term (current) use of antithrombotics/antiplatelets: Secondary | ICD-10-CM | POA: Insufficient documentation

## 2023-08-11 DIAGNOSIS — Z7984 Long term (current) use of oral hypoglycemic drugs: Secondary | ICD-10-CM | POA: Insufficient documentation

## 2023-08-11 DIAGNOSIS — Z7982 Long term (current) use of aspirin: Secondary | ICD-10-CM | POA: Insufficient documentation

## 2023-08-11 DIAGNOSIS — M87051 Idiopathic aseptic necrosis of right femur: Principal | ICD-10-CM | POA: Diagnosis present

## 2023-08-11 DIAGNOSIS — N1831 Chronic kidney disease, stage 3a: Secondary | ICD-10-CM | POA: Diagnosis not present

## 2023-08-11 DIAGNOSIS — E039 Hypothyroidism, unspecified: Secondary | ICD-10-CM | POA: Diagnosis not present

## 2023-08-11 DIAGNOSIS — Z79899 Other long term (current) drug therapy: Secondary | ICD-10-CM | POA: Insufficient documentation

## 2023-08-11 DIAGNOSIS — I130 Hypertensive heart and chronic kidney disease with heart failure and stage 1 through stage 4 chronic kidney disease, or unspecified chronic kidney disease: Secondary | ICD-10-CM | POA: Insufficient documentation

## 2023-08-11 DIAGNOSIS — Z951 Presence of aortocoronary bypass graft: Secondary | ICD-10-CM | POA: Diagnosis not present

## 2023-08-11 DIAGNOSIS — M1611 Unilateral primary osteoarthritis, right hip: Secondary | ICD-10-CM | POA: Diagnosis present

## 2023-08-11 DIAGNOSIS — Z87891 Personal history of nicotine dependence: Secondary | ICD-10-CM | POA: Insufficient documentation

## 2023-08-11 DIAGNOSIS — I4891 Unspecified atrial fibrillation: Secondary | ICD-10-CM | POA: Insufficient documentation

## 2023-08-11 HISTORY — DX: Prediabetes: R73.03

## 2023-08-11 HISTORY — PX: TOTAL HIP ARTHROPLASTY: SHX124

## 2023-08-11 LAB — GLUCOSE, CAPILLARY
Glucose-Capillary: 66 mg/dL — ABNORMAL LOW (ref 70–99)
Glucose-Capillary: 82 mg/dL (ref 70–99)

## 2023-08-11 SURGERY — ARTHROPLASTY, HIP, TOTAL, ANTERIOR APPROACH
Anesthesia: Monitor Anesthesia Care | Site: Hip | Laterality: Right

## 2023-08-11 MED ORDER — DAPAGLIFLOZIN PROPANEDIOL 10 MG PO TABS
10.0000 mg | ORAL_TABLET | Freq: Every day | ORAL | Status: DC
  Administered 2023-08-11: 10 mg via ORAL
  Filled 2023-08-11: qty 1

## 2023-08-11 MED ORDER — MIDAZOLAM HCL 2 MG/2ML IJ SOLN
INTRAMUSCULAR | Status: DC | PRN
  Administered 2023-08-11: 2 mg via INTRAVENOUS

## 2023-08-11 MED ORDER — ONDANSETRON HCL 4 MG/2ML IJ SOLN: 4.0000 mg | Freq: Once | INTRAMUSCULAR | Status: DC | PRN

## 2023-08-11 MED ORDER — ALBUMIN HUMAN 5 % IV SOLN
12.5000 g | Freq: Once | INTRAVENOUS | Status: AC
  Administered 2023-08-11: 12.5 g via INTRAVENOUS

## 2023-08-11 MED ORDER — CEFAZOLIN SODIUM-DEXTROSE 2-4 GM/100ML-% IV SOLN
2.0000 g | Freq: Four times a day (QID) | INTRAVENOUS | Status: AC
  Administered 2023-08-11 (×2): 2 g via INTRAVENOUS
  Filled 2023-08-11 (×2): qty 100

## 2023-08-11 MED ORDER — CELECOXIB 200 MG PO CAPS
200.0000 mg | ORAL_CAPSULE | Freq: Once | ORAL | Status: AC
Start: 2023-08-11 — End: 2023-08-11
  Administered 2023-08-11: 200 mg via ORAL
  Filled 2023-08-11: qty 1

## 2023-08-11 MED ORDER — PANTOPRAZOLE SODIUM 40 MG PO TBEC
40.0000 mg | DELAYED_RELEASE_TABLET | Freq: Every day | ORAL | Status: DC
  Administered 2023-08-12: 40 mg via ORAL
  Filled 2023-08-11: qty 1

## 2023-08-11 MED ORDER — METOCLOPRAMIDE HCL 5 MG/ML IJ SOLN: 5.0000 mg | Freq: Three times a day (TID) | INTRAMUSCULAR | Status: DC | PRN

## 2023-08-11 MED ORDER — PHENYLEPHRINE HCL-NACL 20-0.9 MG/250ML-% IV SOLN
INTRAVENOUS | Status: DC | PRN
  Administered 2023-08-11: 50 ug/min via INTRAVENOUS

## 2023-08-11 MED ORDER — BUPIVACAINE IN DEXTROSE 0.75-8.25 % IT SOLN
INTRATHECAL | Status: DC | PRN
  Administered 2023-08-11: 1.8 mL via INTRATHECAL

## 2023-08-11 MED ORDER — MENTHOL 3 MG MT LOZG: 1.0000 | LOZENGE | OROMUCOSAL | Status: DC | PRN

## 2023-08-11 MED ORDER — DOCUSATE SODIUM 100 MG PO CAPS
100.0000 mg | ORAL_CAPSULE | Freq: Two times a day (BID) | ORAL | Status: DC
  Administered 2023-08-11 – 2023-08-12 (×3): 100 mg via ORAL
  Filled 2023-08-11 (×3): qty 1

## 2023-08-11 MED ORDER — ONDANSETRON HCL 4 MG/2ML IJ SOLN
4.0000 mg | Freq: Four times a day (QID) | INTRAMUSCULAR | Status: DC | PRN
  Administered 2023-08-11: 4 mg via INTRAVENOUS
  Filled 2023-08-11: qty 2

## 2023-08-11 MED ORDER — OXYCODONE HCL 5 MG PO TABS
5.0000 mg | ORAL_TABLET | ORAL | Status: DC | PRN
  Administered 2023-08-11: 10 mg via ORAL
  Filled 2023-08-11: qty 2

## 2023-08-11 MED ORDER — PROPOFOL 10 MG/ML IV BOLUS
INTRAVENOUS | Status: AC
  Filled 2023-08-11: qty 20

## 2023-08-11 MED ORDER — LEVOTHYROXINE SODIUM 75 MCG PO TABS
75.0000 ug | ORAL_TABLET | Freq: Every day | ORAL | Status: DC
  Administered 2023-08-12: 75 ug via ORAL
  Filled 2023-08-11: qty 1

## 2023-08-11 MED ORDER — CHLORHEXIDINE GLUCONATE 0.12 % MT SOLN
15.0000 mL | Freq: Once | OROMUCOSAL | Status: AC
  Administered 2023-08-11: 15 mL via OROMUCOSAL
  Filled 2023-08-11: qty 15

## 2023-08-11 MED ORDER — ALUM & MAG HYDROXIDE-SIMETH 200-200-20 MG/5ML PO SUSP: 30.0000 mL | ORAL | Status: DC | PRN

## 2023-08-11 MED ORDER — FENTANYL CITRATE (PF) 250 MCG/5ML IJ SOLN
INTRAMUSCULAR | Status: AC
  Filled 2023-08-11: qty 5

## 2023-08-11 MED ORDER — MEPERIDINE HCL 25 MG/ML IJ SOLN: 6.2500 mg | INTRAMUSCULAR | Status: DC | PRN

## 2023-08-11 MED ORDER — POVIDONE-IODINE 10 % EX SWAB: 2.0000 | Freq: Once | CUTANEOUS | Status: DC

## 2023-08-11 MED ORDER — EPHEDRINE 5 MG/ML INJ
INTRAVENOUS | Status: AC
  Filled 2023-08-11: qty 5

## 2023-08-11 MED ORDER — LIDOCAINE 2% (20 MG/ML) 5 ML SYRINGE
INTRAMUSCULAR | Status: DC | PRN
  Administered 2023-08-11: 40 mg via INTRAVENOUS

## 2023-08-11 MED ORDER — PHENYLEPHRINE 80 MCG/ML (10ML) SYRINGE FOR IV PUSH (FOR BLOOD PRESSURE SUPPORT)
PREFILLED_SYRINGE | INTRAVENOUS | Status: DC | PRN
  Administered 2023-08-11: 140 ug via INTRAVENOUS
  Administered 2023-08-11: 120 ug via INTRAVENOUS
  Administered 2023-08-11 (×3): 160 ug via INTRAVENOUS

## 2023-08-11 MED ORDER — DIPHENHYDRAMINE HCL 12.5 MG/5ML PO ELIX: 12.5000 mg | ORAL_SOLUTION | ORAL | Status: DC | PRN

## 2023-08-11 MED ORDER — ALBUTEROL SULFATE (2.5 MG/3ML) 0.083% IN NEBU: 3.0000 mL | INHALATION_SOLUTION | Freq: Four times a day (QID) | RESPIRATORY_TRACT | Status: DC | PRN

## 2023-08-11 MED ORDER — ASPIRIN 81 MG PO CHEW
81.0000 mg | CHEWABLE_TABLET | Freq: Two times a day (BID) | ORAL | Status: DC
  Administered 2023-08-11 – 2023-08-12 (×2): 81 mg via ORAL
  Filled 2023-08-11 (×2): qty 1

## 2023-08-11 MED ORDER — ISOSORBIDE MONONITRATE ER 60 MG PO TB24: 30.0000 mg | ORAL_TABLET | Freq: Every day | ORAL | Status: DC

## 2023-08-11 MED ORDER — 0.9 % SODIUM CHLORIDE (POUR BTL) OPTIME
TOPICAL | Status: DC | PRN
  Administered 2023-08-11: 1000 mL

## 2023-08-11 MED ORDER — ORAL CARE MOUTH RINSE: 15.0000 mL | Freq: Once | OROMUCOSAL | Status: AC

## 2023-08-11 MED ORDER — PHENYLEPHRINE 80 MCG/ML (10ML) SYRINGE FOR IV PUSH (FOR BLOOD PRESSURE SUPPORT)
PREFILLED_SYRINGE | INTRAVENOUS | Status: AC
  Filled 2023-08-11: qty 10

## 2023-08-11 MED ORDER — EPHEDRINE SULFATE-NACL 50-0.9 MG/10ML-% IV SOSY
PREFILLED_SYRINGE | INTRAVENOUS | Status: DC | PRN
  Administered 2023-08-11 (×2): 10 mg via INTRAVENOUS
  Administered 2023-08-11: 5 mg via INTRAVENOUS

## 2023-08-11 MED ORDER — ACETAMINOPHEN 500 MG PO TABS
1000.0000 mg | ORAL_TABLET | Freq: Once | ORAL | Status: AC
  Administered 2023-08-11: 1000 mg via ORAL
  Filled 2023-08-11: qty 2

## 2023-08-11 MED ORDER — LIDOCAINE 2% (20 MG/ML) 5 ML SYRINGE
INTRAMUSCULAR | Status: AC
  Filled 2023-08-11: qty 5

## 2023-08-11 MED ORDER — OXYCODONE HCL 5 MG PO TABS: 5.0000 mg | ORAL_TABLET | Freq: Once | ORAL | Status: DC | PRN

## 2023-08-11 MED ORDER — HYDROMORPHONE HCL 1 MG/ML IJ SOLN: 0.5000 mg | INTRAMUSCULAR | Status: DC | PRN

## 2023-08-11 MED ORDER — NITROGLYCERIN 0.4 MG SL SUBL: 0.4000 mg | SUBLINGUAL_TABLET | SUBLINGUAL | Status: DC | PRN

## 2023-08-11 MED ORDER — OXYCODONE HCL 5 MG PO TABS
10.0000 mg | ORAL_TABLET | ORAL | Status: DC | PRN
  Administered 2023-08-11: 10 mg via ORAL
  Filled 2023-08-11 (×2): qty 2

## 2023-08-11 MED ORDER — AZATHIOPRINE 50 MG PO TABS
50.0000 mg | ORAL_TABLET | Freq: Two times a day (BID) | ORAL | Status: DC
  Administered 2023-08-11 (×2): 50 mg via ORAL
  Filled 2023-08-11 (×4): qty 1

## 2023-08-11 MED ORDER — METFORMIN HCL 500 MG PO TABS
500.0000 mg | ORAL_TABLET | Freq: Two times a day (BID) | ORAL | Status: DC
  Administered 2023-08-11: 500 mg via ORAL
  Filled 2023-08-11: qty 1

## 2023-08-11 MED ORDER — FENTANYL CITRATE (PF) 100 MCG/2ML IJ SOLN: 25.0000 ug | INTRAMUSCULAR | Status: DC | PRN

## 2023-08-11 MED ORDER — LACTATED RINGERS IV SOLN: INTRAVENOUS | Status: DC

## 2023-08-11 MED ORDER — METOPROLOL SUCCINATE ER 50 MG PO TB24: 50.0000 mg | ORAL_TABLET | Freq: Every day | ORAL | Status: DC

## 2023-08-11 MED ORDER — OXYCODONE HCL 5 MG/5ML PO SOLN: 5.0000 mg | Freq: Once | ORAL | Status: DC | PRN

## 2023-08-11 MED ORDER — SODIUM CHLORIDE 0.9 % IV BOLUS
1000.0000 mL | Freq: Once | INTRAVENOUS | Status: AC
  Administered 2023-08-11: 1000 mL via INTRAVENOUS

## 2023-08-11 MED ORDER — FUROSEMIDE 20 MG PO TABS
20.0000 mg | ORAL_TABLET | Freq: Every day | ORAL | Status: DC
  Administered 2023-08-11: 20 mg via ORAL
  Filled 2023-08-11: qty 1

## 2023-08-11 MED ORDER — POLYETHYLENE GLYCOL 3350 17 G PO PACK: 17.0000 g | PACK | Freq: Every day | ORAL | Status: DC | PRN

## 2023-08-11 MED ORDER — METOCLOPRAMIDE HCL 5 MG PO TABS: 5.0000 mg | ORAL_TABLET | Freq: Three times a day (TID) | ORAL | Status: DC | PRN

## 2023-08-11 MED ORDER — CYCLOBENZAPRINE HCL 10 MG PO TABS
10.0000 mg | ORAL_TABLET | Freq: Three times a day (TID) | ORAL | Status: DC | PRN
  Administered 2023-08-11: 10 mg via ORAL
  Filled 2023-08-11 (×2): qty 1

## 2023-08-11 MED ORDER — ALBUMIN HUMAN 5 % IV SOLN
INTRAVENOUS | Status: AC
  Filled 2023-08-11: qty 250

## 2023-08-11 MED ORDER — MIDAZOLAM HCL 2 MG/2ML IJ SOLN
INTRAMUSCULAR | Status: AC
  Filled 2023-08-11: qty 2

## 2023-08-11 MED ORDER — PHENOL 1.4 % MT LIQD: 1.0000 | OROMUCOSAL | Status: DC | PRN

## 2023-08-11 MED ORDER — POTASSIUM CHLORIDE CRYS ER 20 MEQ PO TBCR: 20.0000 meq | EXTENDED_RELEASE_TABLET | Freq: Every day | ORAL | Status: DC

## 2023-08-11 MED ORDER — CEFAZOLIN SODIUM-DEXTROSE 2-4 GM/100ML-% IV SOLN
2.0000 g | INTRAVENOUS | Status: AC
  Administered 2023-08-11: 2 g via INTRAVENOUS
  Filled 2023-08-11: qty 100

## 2023-08-11 MED ORDER — TRANEXAMIC ACID-NACL 1000-0.7 MG/100ML-% IV SOLN
1000.0000 mg | INTRAVENOUS | Status: AC
  Administered 2023-08-11: 1000 mg via INTRAVENOUS
  Filled 2023-08-11: qty 100

## 2023-08-11 MED ORDER — ONDANSETRON HCL 4 MG PO TABS: 4.0000 mg | ORAL_TABLET | Freq: Four times a day (QID) | ORAL | Status: DC | PRN

## 2023-08-11 MED ORDER — PROPOFOL 500 MG/50ML IV EMUL
INTRAVENOUS | Status: DC | PRN
  Administered 2023-08-11: 50 ug/kg/min via INTRAVENOUS

## 2023-08-11 MED ORDER — ACETAMINOPHEN 325 MG PO TABS
325.0000 mg | ORAL_TABLET | Freq: Four times a day (QID) | ORAL | Status: DC | PRN
  Administered 2023-08-12 (×2): 650 mg via ORAL
  Filled 2023-08-11 (×2): qty 2

## 2023-08-11 MED ORDER — ROSUVASTATIN CALCIUM 20 MG PO TABS
20.0000 mg | ORAL_TABLET | Freq: Every day | ORAL | Status: DC
  Administered 2023-08-11: 20 mg via ORAL
  Filled 2023-08-11: qty 1

## 2023-08-11 MED ORDER — SODIUM CHLORIDE 0.9 % IV SOLN: INTRAVENOUS | Status: DC

## 2023-08-11 MED ORDER — SODIUM CHLORIDE 0.9 % IR SOLN
Status: DC | PRN
  Administered 2023-08-11: 1000 mL

## 2023-08-11 MED ORDER — PREGABALIN 25 MG PO CAPS
50.0000 mg | ORAL_CAPSULE | Freq: Two times a day (BID) | ORAL | Status: DC | PRN
  Administered 2023-08-12: 50 mg via ORAL
  Filled 2023-08-11: qty 2

## 2023-08-11 SURGICAL SUPPLY — 44 items
BAG COUNTER SPONGE SURGICOUNT (BAG) ×1 IMPLANT
BALL HIP CERAMIC 32MM PLUS 9 IMPLANT
BENZOIN TINCTURE PRP APPL 2/3 (GAUZE/BANDAGES/DRESSINGS) ×1 IMPLANT
BLADE CLIPPER SURG (BLADE) IMPLANT
BLADE SAW SGTL 18X1.27X75 (BLADE) ×1 IMPLANT
COVER SURGICAL LIGHT HANDLE (MISCELLANEOUS) ×1 IMPLANT
CUP SECTOR GRIPTON 50MM (Cup) IMPLANT
DRAPE C-ARM 42X72 X-RAY (DRAPES) ×1 IMPLANT
DRAPE STERI IOBAN 125X83 (DRAPES) ×1 IMPLANT
DRAPE U-SHAPE 47X51 STRL (DRAPES) ×3 IMPLANT
DRSG AQUACEL AG ADV 3.5X10 (GAUZE/BANDAGES/DRESSINGS) ×1 IMPLANT
DRSG XEROFORM 1X8 (GAUZE/BANDAGES/DRESSINGS) IMPLANT
DURAPREP 26ML APPLICATOR (WOUND CARE) ×1 IMPLANT
ELECT BLADE 6.5 EXT (BLADE) IMPLANT
ELECTRODE BLDE 4.0 EZ CLN MEGD (MISCELLANEOUS) ×1 IMPLANT
ELECTRODE REM PT RTRN 9FT ADLT (ELECTROSURGICAL) ×1 IMPLANT
FACESHIELD WRAPAROUND (MASK) ×2 IMPLANT
FACESHIELD WRAPAROUND OR TEAM (MASK) ×2 IMPLANT
GLOVE BIOGEL PI IND STRL 8 (GLOVE) ×2 IMPLANT
GLOVE ECLIPSE 8.0 STRL XLNG CF (GLOVE) ×1 IMPLANT
GLOVE ORTHO TXT STRL SZ7.5 (GLOVE) ×2 IMPLANT
GOWN STRL REUS W/ TWL LRG LVL3 (GOWN DISPOSABLE) ×2 IMPLANT
GOWN STRL REUS W/ TWL XL LVL3 (GOWN DISPOSABLE) ×2 IMPLANT
KIT BASIN OR (CUSTOM PROCEDURE TRAY) ×1 IMPLANT
KIT TURNOVER KIT B (KITS) ×1 IMPLANT
LINER ACET PNNCL PLUS4 NEUTRAL (Hips) IMPLANT
MANIFOLD NEPTUNE II (INSTRUMENTS) ×1 IMPLANT
NS IRRIG 1000ML POUR BTL (IV SOLUTION) ×1 IMPLANT
PACK TOTAL JOINT (CUSTOM PROCEDURE TRAY) ×1 IMPLANT
PAD ARMBOARD POSITIONER FOAM (MISCELLANEOUS) ×1 IMPLANT
SET HNDPC FAN SPRY TIP SCT (DISPOSABLE) ×1 IMPLANT
STAPLER VISISTAT 35W (STAPLE) IMPLANT
STEM FEM ACTIS HIGH SZ2 (Stem) IMPLANT
STRIP CLOSURE SKIN 1/2X4 (GAUZE/BANDAGES/DRESSINGS) ×2 IMPLANT
SUT ETHIBOND NAB CT1 #1 30IN (SUTURE) ×1 IMPLANT
SUT ETHILON 2 0 FS 18 (SUTURE) IMPLANT
SUT MNCRL AB 4-0 PS2 18 (SUTURE) IMPLANT
SUT VIC AB 0 CT1 27XBRD ANBCTR (SUTURE) ×1 IMPLANT
SUT VIC AB 1 CT1 27XBRD ANBCTR (SUTURE) ×1 IMPLANT
SUT VIC AB 2-0 CT1 TAPERPNT 27 (SUTURE) ×1 IMPLANT
TOWEL GREEN STERILE (TOWEL DISPOSABLE) ×1 IMPLANT
TOWEL GREEN STERILE FF (TOWEL DISPOSABLE) ×1 IMPLANT
TRAY FOLEY W/BAG SLVR 16FR ST (SET/KITS/TRAYS/PACK) IMPLANT
WATER STERILE IRR 1000ML POUR (IV SOLUTION) ×2 IMPLANT

## 2023-08-11 NOTE — Discharge Instructions (Signed)
 Per [facility] clinic policy, our goal is ensure optimal postoperative pain control with a multimodal pain management strategy. For all [facility] patients, our goal is to wean post-operative narcotic medications by 6 weeks post-operatively. If this is not possible due to utilization of pain medication prior to surgery, your [facility] doctor will support your acute post-operative pain control for the first 6 weeks postoperatively, with a plan to transition you back to your primary pain team following that. [name] will work to ensure a Therapist, occupational.  INSTRUCTIONS AFTER JOINT REPLACEMENT   Remove items at home which could result in a fall. This includes throw rugs or furniture in walking pathways ICE to the affected joint every three hours while awake for 30 minutes at a time, for at least the first 3-5 days, and then as needed for pain and swelling.  Continue to use ice for pain and swelling. You may notice swelling that will progress down to the foot and ankle.  This is normal after surgery.  Elevate your leg when you are not up walking on it.   Continue to use the breathing machine you got in the hospital (incentive spirometer) which will help keep your temperature down.  It is common for your temperature to cycle up and down following surgery, especially at night when you are not up moving around and exerting yourself.  The breathing machine keeps your lungs expanded and your temperature down.   DIET:  As you were doing prior to hospitalization, we recommend a well-balanced diet.  DRESSING / WOUND CARE / SHOWERING  Keep the surgical dressing until follow up.  The dressing is water proof, so you can shower without any extra covering.  IF THE DRESSING FALLS OFF or the wound gets wet inside, change the dressing with sterile gauze.  Please use good hand washing techniques before changing the dressing.  Do not use any lotions or creams on the incision until instructed by your surgeon.     ACTIVITY  Increase activity slowly as tolerated, but follow the weight bearing instructions below.   No driving for 6 weeks or until further direction given by your physician.  You cannot drive while taking narcotics.  No lifting or carrying greater than 10 lbs. until further directed by your surgeon. Avoid periods of inactivity such as sitting longer than an hour when not asleep. This helps prevent blood clots.  You may return to work once you are authorized by your doctor.     WEIGHT BEARING   Weight bearing as tolerated with assist device (walker, cane, etc) as directed, use it as long as suggested by your surgeon or therapist, typically at least 4-6 weeks.   EXERCISES  Results after joint replacement surgery are often greatly improved when you follow the exercise, range of motion and muscle strengthening exercises prescribed by your doctor. Safety measures are also important to protect the joint from further injury. Any time any of these exercises cause you to have increased pain or swelling, decrease what you are doing until you are comfortable again and then slowly increase them. If you have problems or questions, call your caregiver or physical therapist for advice.   Rehabilitation is important following a joint replacement. After just a few days of immobilization, the muscles of the leg can become weakened and shrink (atrophy).  These exercises are designed to build up the tone and strength of the thigh and leg muscles and to improve motion. Often times heat used for twenty to thirty minutes before  working out will loosen up your tissues and help with improving the range of motion but do not use heat for the first two weeks following surgery (sometimes heat can increase post-operative swelling).   These exercises can be done on a training (exercise) mat, on the floor, on a table or on a bed. Use whatever works the best and is most comfortable for you.    Use music or television  while you are exercising so that the exercises are a pleasant break in your day. This will make your life better with the exercises acting as a break in your routine that you can look forward to.   Perform all exercises about fifteen times, three times per day or as directed.  You should exercise both the operative leg and the other leg as well.  Exercises include:   Quad Sets - Tighten up the muscle on the front of the thigh (Quad) and hold for 5-10 seconds.   Straight Leg Raises - With your knee straight (if you were given a brace, keep it on), lift the leg to 60 degrees, hold for 3 seconds, and slowly lower the leg.  Perform this exercise against resistance later as your leg gets stronger.  Leg Slides: Lying on your back, slowly slide your foot toward your buttocks, bending your knee up off the floor (only go as far as is comfortable). Then slowly slide your foot back down until your leg is flat on the floor again.  Angel Wings: Lying on your back spread your legs to the side as far apart as you can without causing discomfort.  Hamstring Strength:  Lying on your back, push your heel against the floor with your leg straight by tightening up the muscles of your buttocks.  Repeat, but this time bend your knee to a comfortable angle, and push your heel against the floor.  You may put a pillow under the heel to make it more comfortable if necessary.   A rehabilitation program following joint replacement surgery can speed recovery and prevent re-injury in the future due to weakened muscles. Contact your doctor or a physical therapist for more information on knee rehabilitation.    CONSTIPATION  Constipation is defined medically as fewer than three stools per week and severe constipation as less than one stool per week.  Even if you have a regular bowel pattern at home, your normal regimen is likely to be disrupted due to multiple reasons following surgery.  Combination of anesthesia, postoperative  narcotics, change in appetite and fluid intake all can affect your bowels.   YOU MUST use at least one of the following options; they are listed in order of increasing strength to get the job done.  They are all available over the counter, and you may need to use some, POSSIBLY even all of these options:    Drink plenty of fluids (prune juice may be helpful) and high fiber foods Colace 100 mg by mouth twice a day  Senokot for constipation as directed and as needed Dulcolax (bisacodyl), take with full glass of water  Miralax (polyethylene glycol) once or twice a day as needed.  If you have tried all these things and are unable to have a bowel movement in the first 3-4 days after surgery call either your surgeon or your primary doctor.    If you experience loose stools or diarrhea, hold the medications until you stool forms back up.  If your symptoms do not get better within 1 week  or if they get worse, check with your doctor.  If you experience "the worst abdominal pain ever" or develop nausea or vomiting, please contact the office immediately for further recommendations for treatment.   ITCHING:  If you experience itching with your medications, try taking only a single pain pill, or even half a pain pill at a time.  You can also use Benadryl over the counter for itching or also to help with sleep.   TED HOSE STOCKINGS:  Use stockings on both legs until for at least 2 weeks or as directed by physician office. They may be removed at night for sleeping.  MEDICATIONS:  See your medication summary on the "After Visit Summary" that nursing will review with you.  You may have some home medications which will be placed on hold until you complete the course of blood thinner medication.  It is important for you to complete the blood thinner medication as prescribed.  PRECAUTIONS:  If you experience chest pain or shortness of breath - call 911 immediately for transfer to the hospital emergency department.    If you develop a fever greater that 101 F, purulent drainage from wound, increased redness or drainage from wound, foul odor from the wound/dressing, or calf pain - CONTACT YOUR SURGEON.                                                   FOLLOW-UP APPOINTMENTS:  If you do not already have a post-op appointment, please call the office for an appointment to be seen by your surgeon.  Guidelines for how soon to be seen are listed in your "After Visit Summary", but are typically between 1-4 weeks after surgery.  OTHER INSTRUCTIONS:   Knee Replacement:  Do not place pillow under knee, focus on keeping the knee straight while resting. CPM instructions: 0-90 degrees, 2 hours in the morning, 2 hours in the afternoon, and 2 hours in the evening. Place foam block, curve side up under heel at all times except when in CPM or when walking.  DO NOT modify, tear, cut, or change the foam block in any way.  POST-OPERATIVE OPIOID TAPER INSTRUCTIONS: It is important to wean off of your opioid medication as soon as possible. If you do not need pain medication after your surgery it is ok to stop day one. Opioids include: Codeine, Hydrocodone(Norco, Vicodin), Oxycodone(Percocet, oxycontin) and hydromorphone amongst others.  Long term and even short term use of opiods can cause: Increased pain response Dependence Constipation Depression Respiratory depression And more.  Withdrawal symptoms can include Flu like symptoms Nausea, vomiting And more Techniques to manage these symptoms Hydrate well Eat regular healthy meals Stay active Use relaxation techniques(deep breathing, meditating, yoga) Do Not substitute Alcohol to help with tapering If you have been on opioids for less than two weeks and do not have pain than it is ok to stop all together.  Plan to wean off of opioids This plan should start within one week post op of your joint replacement. Maintain the same interval or time between taking each dose  and first decrease the dose.  Cut the total daily intake of opioids by one tablet each day Next start to increase the time between doses. The last dose that should be eliminated is the evening dose.   MAKE SURE YOU:  Understand these instructions.  Get help right away if you are not doing well or get worse.    Thank you for letting us be a part of your medical care team.  It is a privilege we respect greatly.  We hope these instructions will help you stay on track for a fast and full recovery!      Dental Antibiotics:  In most cases prophylactic antibiotics for Dental procdeures after total joint surgery are not necessary.  Exceptions are as follows:  1. History of prior total joint infection  2. Severely immunocompromised (Organ Transplant, cancer chemotherapy, Rheumatoid biologic meds such as Humera)  3. Poorly controlled diabetes (A1C &gt; 8.0, blood glucose over 200)  If you have one of these conditions, contact your surgeon for an antibiotic prescription, prior to your dental procedure.

## 2023-08-11 NOTE — Care Management (Signed)
  Unit staff to provide DME needed for home.    Patient set up from the office prior to admission with [facility] services through [facility]   Information added to AVS

## 2023-08-11 NOTE — Interval H&P Note (Signed)
 History and Physical Interval Note: The patient understands that she is here today for a right total hip replacement to treat her right hip avascular necrosis.  There has been no acute or interval change in her medical status.  The risks and benefits of surgery have been discussed in detail and informed consent has been obtained.  The right operative hip has been marked.  08/11/2023 7:11 AM  [name]  has presented today for surgery, with the diagnosis of avascular necrosis right hip.  The various methods of treatment have been discussed with the patient and family. After consideration of risks, benefits and other options for treatment, the patient has consented to  Procedure(s): ARTHROPLASTY, HIP, TOTAL, ANTERIOR APPROACH (Right) as a surgical intervention.  The patient's history has been reviewed, patient examined, no change in status, stable for surgery.  I have reviewed the patient's chart and labs.  Questions were answered to the patient's satisfaction.     Arnie Lao

## 2023-08-11 NOTE — Transfer of Care (Signed)
 Immediate Anesthesia Transfer of Care Note  Patient: [name]  Procedure(s) Performed: ARTHROPLASTY, HIP, TOTAL, ANTERIOR APPROACH (Right: Hip)  Patient Location: PACU  Anesthesia Type:MAC combined with regional for post-op pain  Level of Consciousness: awake and alert   Airway & Oxygen Therapy: Patient Spontanous Breathing and Patient connected to face mask oxygen  Post-op Assessment: Report given to RN and Post -op Vital signs reviewed and stable  Post vital signs: Reviewed and stable  Last Vitals:  Vitals Value Taken Time  BP 84/55 08/11/23 0920  Temp 36.3 C 08/11/23 0915  Pulse 54 08/11/23 0922  Resp 16 08/11/23 0922  SpO2 92 % 08/11/23 0922  Vitals shown include unfiled device data.  Last Pain:  Vitals:   08/11/23 0617  TempSrc:   PainSc: 2       Patients Stated Pain Goal: 0 (08/11/23 0617)  Complications: No notable events documented.

## 2023-08-11 NOTE — Progress Notes (Signed)
 Pt with vagal response due to bathroom trip. BP 50/32 upon returning to bed after unsuccessful urination attempt. Pt stated she was straining during attempt. Bolus given. BP 101/54 after bolus initiated. Will continue to monitor.

## 2023-08-11 NOTE — Op Note (Signed)
 Operative Note  Date of operation: 08/11/2023 Preoperative diagnosis: Right hip primary osteoarthritis Postoperative diagnosis: Same  Procedure: Right direct anterior total hip arthroplasty  Implants: Implant Name Type Inv. Item Serial No. Manufacturer Lot No. LRB No. Used Action  CUP SECTOR GRIPTON - [id number] Cup CUP SECTOR GRIPTON  [employer]PU[employer] [id number]  1 Implanted  LINER ACET PNNCL PLUS4 NEUTRAL - [id number] Hips LINER ACET PNNCL PLUS4 NEUTRAL  DEPUY ORTHOPA[employer] [id number] Right 1 Implanted  STEM FEM ACTIS HIGH SZ2 - [id number] Stem STEM FEM ACTIS HIGH SZ2  DEPUY ORTHOPA[employer]ICS [id number] Right 1 Implanted  BALL HIP CERAMIC PLUS 9 - [id number]  BALL HIP CERAMIC PLUS 9  DEPUY ORTHOPAEDICS [id number] Right 1 Implanted   Surgeon: Jeanella Milan. Lucienne Ryder, MD Assistant: Malena Scull, PA-C  Anesthesia: Spinal EBL: 250 cc Antibiotics: 2 g IV Ancef Complications: None  Indications: The patient is a very pleasant 68 year old female with avascular necrosis of her right hip with superior labral tearing as well.  This has become quite painful and symptomatic for her.  There is also some underlying osteoarthritis.  At this point her pain is daily with her right hip and it is detrimentally affecting her mobility, her quality of life, and her actives daily living to the point she wished to proceed with a total hip arthroplasty.  We agree with this as well.  The case certainly will be difficult given her morbid obesity.  There is a heightened risks of acute blood loss anemia, nerve or vessel injury, fracture, infection, DVT, dislocation, implant failure, leg length differences and wound healing issues.  She does have some AVN on her other hip to which we will have to address at a later time.  It is not as severe as the right side.  She understands that our goals are hopefully decreased pain, improved mobility, and improved quality of life.  Procedure description: After informed consent was  obtained and the appropriate right hip was marked, the patient was brought to the operating room and set up on the stretcher where spinal anesthesia was obtained.  She was then laid in a supine position on the stretcher and a Foley catheter was placed.  Traction boots were placed on both her feet and next she was placed supine on the Hana fracture table with a perineal post in place and both legs in inline skeletal traction devices but no traction applied.  Her right operative hip and pelvis were assessed radiographically.  The right hip was prepped and draped with DuraPrep and sterile drapes.  A timeout was called and she was identified as the correct patient the correct right hip.  An incision was then made just inferior and posterior to the ASIS and carried slightly obliquely down the leg.  Dissection was carried down to the tensor fascia lata muscle and the tensor fascia was then divided longitudinally to proceed with a direct anterior approach to the hip.  Circumflex vessels were identified and cauterized.  The hip capsule was identified and opened up in L-type format finding a moderate joint effusion.  Cobra retractors were placed around the medial and lateral femoral neck and a femoral neck cut was made with an oscillating saw just proximal to the lesser trochanter.  This cut was completed with an osteotome.  A corkscrew guide was placed in the femoral head and the femoral head was removed in its entirety.  A bent Hohmann was then placed over the medial acetabular rim and remnants of the acetabular labrum  were removed.  Reaming was then initiated from a size 43 reamer and stepwise increments going up to a size 50 reamer with all reamers placed under direct visualization in the last 2 reamers also placed under direct fluoroscopy in order to obtain the depth of reaming, the inclination and the anteversion.  The real DePuy sector GRIPTION acetabular component size 50 was then placed without difficulty followed  by a 32+4 polythene liner.  Attention was then turned to the femur.  With the right leg externally rotated to 120 degrees, extended and abducted, a Mueller retractor was placed medially and a Hohmann retractor behind a greater trochanter did a box cutting osteotome was used into the femoral canal and broaching was then initiated using the Actis broaching system from a size 0 going to a size 2.  With a size 2 in place we trialed a standard offset femoral neck and a 32+1 head ball.  The right leg was brought over and up and it reduced in the pelvis easily and was definitely not stable.  We assessed it radiographically to make sure that our stem size is correct but based on radiographic assessment we definitely needed more offset and leg length.  We dislocated the hip and remove the trial components.  We placed the real Actis femoral component size 2 with high offset and went with a 32+9 ceramic head ball knowing that we needed the stability and would likely lengthen her leg lengths.  She was slightly shorter on the right side before surgery and we know that we will have to address the left side at some point.  We are pleased with range of motion and stability assessed radiographically and mechanically.  We then irrigated the soft tissue with normal saline solution.  Remnants of the joint capsule were closed with interrupted #1 Ethibond suture followed by #1 Vicryl close the tensor fascia.  0 Vicryl was used to close the deep tissue and 2-0 Vicryl was used to close subcutaneous tissue.  The skin was closed with interrupted 2-0 nylon suture.  An Aquacel dressing was applied.  The patient was taken off of the Hana table and taken to the recovery room.  Malena Scull, PA-C did assist during the entire case from beginning the end and his assistance was crucial and medically necessary for soft tissue management and retraction, helping guide implant placement and a layered closure of the wound.

## 2023-08-11 NOTE — Anesthesia Procedure Notes (Signed)
 Spinal  Patient location during procedure: OR Start time: 08/11/2023 7:38 AM End time: 08/11/2023 7:44 AM Reason for block: surgical anesthesia Staffing Anesthesiologist: Rhenda Cedars, MD Performed by: Rhenda Cedars, MD Authorized by: Rhenda Cedars, MD   Preanesthetic Checklist Completed: patient identified, IV checked, site marked, risks and benefits discussed, surgical consent, monitors and equipment checked, pre-op evaluation and timeout performed Spinal Block Patient position: sitting Prep: DuraPrep Patient monitoring: heart rate, cardiac monitor, continuous pulse ox and blood pressure Approach: midline Location: L3-4 Injection technique: single-shot Needle Needle type: Sprotte  Needle gauge: 24 G Needle length: 9 cm Assessment Sensory level: T4 Events: CSF return

## 2023-08-11 NOTE — Evaluation (Signed)
 Physical Therapy Evaluation Patient Details Name: [name] MRN: [id number] DOB: 02/19/1956 Today's Date: 08/11/2023  History of Present Illness  Pt is a 68 y.o. F who presents s/p R direct anterior total hip arthroplasty 5/20. Significant PMH: AVN of bilateral hips, obesity, chronic heart failure, CKD 3a, CAD s/p CABG.  Clinical Impression  Pt admitted s/p R THA. Pt reports good pain control and overall is mobilizing well. Ambulating 150 ft with a walker, progressing to step through pattern. Has hip hiking due to leg length discrepancy. Will benefit from stair training tomorrow prior to d/c as she has 17 steps to her bedroom.         If plan is discharge home, recommend the following: Assistance with cooking/housework;Assist for transportation   Can travel by private vehicle        Equipment Recommendations None recommended by PT (pt said friend will provide RW and 3 in1)  Recommendations for Other Services       Functional Status Assessment Patient has had a recent decline in their functional status and demonstrates the ability to make significant improvements in function in a reasonable and predictable amount of time.     Precautions / Restrictions Precautions Precautions: Fall Restrictions Weight Bearing Restrictions Per Provider Order: No      Mobility  Bed Mobility Overal bed mobility: Modified Independent                  Transfers Overall transfer level: Needs assistance Equipment used: Rolling walker (2 wheels) Transfers: Sit to/from Stand Sit to Stand: Supervision           General transfer comment: Cues for hand placement, increased time to power up    Ambulation/Gait Ambulation/Gait assistance: Contact guard assist Gait Distance (Feet): 150 Feet Assistive device: Rolling walker (2 wheels) Gait Pattern/deviations: Step-to pattern, Step-through pattern, Decreased stance time - right Gait velocity: decreased     General Gait Details:  Step to progressing to step through pattern. Hip hike on L due to leg length discrepancy. Cues for walker use and decreased RLE circumduction  Stairs            Wheelchair Mobility     Tilt Bed    Modified Rankin (Stroke Patients Only)       Balance Overall balance assessment: Needs assistance Sitting-balance support: Feet supported Sitting balance-Leahy Scale: Good     Standing balance support: Bilateral upper extremity supported Standing balance-Leahy Scale: Poor                               Pertinent Vitals/Pain Pain Assessment Pain Assessment: Faces Faces Pain Scale: Hurts a little bit Pain Location: L hip Pain Descriptors / Indicators: Operative site guarding Pain Intervention(s): Monitored during session    Home Living Family/patient expects to be discharged to:: Private residence Living Arrangements: Alone Available Help at Discharge: Friend(s) Type of Home: House Home Access: Level entry     Alternate Level Stairs-Number of Steps: 17 Home Layout: Two level Home Equipment: None      Prior Function Prior Level of Function : Independent/Modified Independent             Mobility Comments: works from home       Extremity/Trunk Assessment   Upper Extremity Assessment Upper Extremity Assessment: Overall WFL for tasks assessed    Lower Extremity Assessment Lower Extremity Assessment: RLE deficits/detail RLE Deficits / Details: s/p THA. Able to perform LAQ,  ankle dorsiflexion WFL    Cervical / Trunk Assessment Cervical / Trunk Assessment: Normal  Communication   Communication Communication: No apparent difficulties    Cognition Arousal: Alert Behavior During Therapy: WFL for tasks assessed/performed   PT - Cognitive impairments: No apparent impairments                         Following commands: Intact       Cueing Cueing Techniques: Verbal cues     General Comments      Exercises General Exercises -  Lower Extremity Long Arc Quad: Both, 10 reps, Seated Toe Raises: Both, 10 reps, Seated Heel Raises: Both, 10 reps, Seated   Assessment/Plan    PT Assessment Patient needs continued PT services  PT Problem List Decreased strength;Decreased activity tolerance;Decreased balance;Decreased mobility;Pain       PT Treatment Interventions DME instruction;Gait training;Stair training;Functional mobility training;Therapeutic activities;Therapeutic exercise;Balance training;Patient/family education    PT Goals (Current goals can be found in the Care Plan section)  Acute Rehab PT Goals Patient Stated Goal: to get left hip replaced next PT Goal Formulation: With patient Time For Goal Achievement: 08/25/23 Potential to Achieve Goals: Good    Frequency 7X/week     Co-evaluation               AM-PAC PT "6 Clicks" Mobility  Outcome Measure Help needed turning from your back to your side while in a flat bed without using bedrails?: None Help needed moving from lying on your back to sitting on the side of a flat bed without using bedrails?: None Help needed moving to and from a bed to a chair (including a wheelchair)?: A Little Help needed standing up from a chair using your arms (e.g., wheelchair or bedside chair)?: A Little Help needed to walk in hospital room?: A Little Help needed climbing 3-5 steps with a railing? : A Little 6 Click Score: 20    End of Session   Activity Tolerance: Patient tolerated treatment well Patient left: in chair;with call bell/phone within reach;with family/visitor present Nurse Communication: Mobility status PT Visit Diagnosis: Other abnormalities of gait and mobility (R26.89);Pain;Difficulty in walking, not elsewhere classified (R26.2) Pain - Right/Left: Right Pain - part of body: Hip    Time: 1413-1436 PT Time Calculation (min) (ACUTE ONLY): 23 min   Charges:   PT Evaluation $PT Eval Low Complexity: 1 Low PT Treatments $Gait Training: 8-22  mins PT General Charges $$ ACUTE PT VISIT: 1 Visit         Verdia Glad, PT, DPT Acute Rehabilitation Services Office [phone]   Claria Crofts 08/11/2023, 4:30 PM

## 2023-08-12 ENCOUNTER — Encounter (HOSPITAL_COMMUNITY): Payer: Self-pay | Admitting: Orthopaedic Surgery

## 2023-08-12 DIAGNOSIS — M1611 Unilateral primary osteoarthritis, right hip: Secondary | ICD-10-CM | POA: Diagnosis not present

## 2023-08-12 LAB — CBC
HCT: 32.3 % — ABNORMAL LOW (ref 36.0–46.0)
Hemoglobin: 11 g/dL — ABNORMAL LOW (ref 12.0–15.0)
MCH: 33.5 pg (ref 26.0–34.0)
MCHC: 34.1 g/dL (ref 30.0–36.0)
MCV: 98.5 fL (ref 80.0–100.0)
Platelets: 134 10*3/uL — ABNORMAL LOW (ref 150–400)
RBC: 3.28 MIL/uL — ABNORMAL LOW (ref 3.87–5.11)
RDW: 16.4 % — ABNORMAL HIGH (ref 11.5–15.5)
WBC: 6 10*3/uL (ref 4.0–10.5)
nRBC: 0 % (ref 0.0–0.2)

## 2023-08-12 LAB — GLUCOSE, CAPILLARY: Glucose-Capillary: 101 mg/dL — ABNORMAL HIGH (ref 70–99)

## 2023-08-12 LAB — BASIC METABOLIC PANEL WITH GFR
Anion gap: 8 (ref 5–15)
BUN: 14 mg/dL (ref 8–23)
CO2: 22 mmol/L (ref 22–32)
Calcium: 7.7 mg/dL — ABNORMAL LOW (ref 8.9–10.3)
Chloride: 109 mmol/L (ref 98–111)
Creatinine, Ser: 1.17 mg/dL — ABNORMAL HIGH (ref 0.44–1.00)
GFR, Estimated: 51 mL/min — ABNORMAL LOW (ref >60)
Glucose, Bld: 109 mg/dL — ABNORMAL HIGH (ref 70–99)
Potassium: 4.1 mmol/L (ref 3.5–5.1)
Sodium: 139 mmol/L (ref 135–145)

## 2023-08-12 MED ORDER — ASPIRIN 81 MG PO CHEW
81.0000 mg | CHEWABLE_TABLET | Freq: Two times a day (BID) | ORAL | 0 refills | Status: DC
Start: 2023-08-12 — End: 2023-08-19

## 2023-08-12 MED ORDER — OXYCODONE HCL 5 MG PO TABS
5.0000 mg | ORAL_TABLET | Freq: Four times a day (QID) | ORAL | 0 refills | Status: DC | PRN
Start: 2023-08-12 — End: 2023-08-25

## 2023-08-12 NOTE — Progress Notes (Signed)
 Patient alert and oriented, ambulate, void. Vital sign stable, surgical site clean and dry no sign of infection. D/c instructions explain and given to the patient, all questions answered. Patient d/c home with RW, and 3 in 1 per order.

## 2023-08-12 NOTE — Progress Notes (Signed)
 Physical Therapy Treatment  Patient Details Name: [name] MRN: [id number] DOB: 1955/09/13 Today's Date: 08/12/2023   History of Present Illness Pt is a 68 y.o. F who presents s/p R direct anterior total hip arthroplasty 5/20. Significant PMH: AVN of bilateral hips, obesity, chronic heart failure, CKD 3a, CAD s/p CABG.    PT Comments  Pt reports feeling better this afternoon with decreased pain. Reviewed HEP, car transfer and general safety with activity progression at home. Pt completed stair training and was able to improve gait distance. Pt anticipates d/c home this afternoon. Will continue to follow.     If plan is discharge home, recommend the following: Assistance with cooking/housework;Assist for transportation   Can travel by private vehicle        Equipment Recommendations  Rolling walker (2 wheels);BSC/3in1    Recommendations for Other Services       Precautions / Restrictions Precautions Precautions: Fall Recall of Precautions/Restrictions: Intact Restrictions Weight Bearing Restrictions Per Provider Order: No     Mobility  Bed Mobility Overal bed mobility: Needs Assistance Bed Mobility: Supine to Sit     Supine to sit: Min assist     General bed mobility comments: Pt was received sitting up in recliner.    Transfers Overall transfer level: Needs assistance Equipment used: Rolling walker (2 wheels) Transfers: Sit to/from Stand Sit to Stand: Contact guard assist           General transfer comment: VC's for hand placement on seated surface for safety. Hands on guarding for safety but no assist required for power up to full stand.    Ambulation/Gait Ambulation/Gait assistance: Contact guard assist Gait Distance (Feet): 175 Feet Assistive device: Rolling walker (2 wheels) Gait Pattern/deviations: Step-through pattern, Decreased stance time - right Gait velocity: Decreased Gait velocity interpretation: 1.31 - 2.62 ft/sec, indicative of  limited community ambulator   General Gait Details: Pt was able to ambulate out in the hall with RW for support. VC's for improved heel strike, improved posture, closer walker proximity and forward gaze. No overt LOB noted.   Stairs Stairs: Yes Stairs assistance: Contact guard assist Stair Management: One rail Right, Step to pattern, Sideways, Forwards Number of Stairs: 7 General stair comments: VC's for sequencing and general safety. Hands on guarding for safety. Pt initially facing forward and then turned to face sideways to hold to railing with both hands. Pt with no overt LOB, no knee buckling noted either.   Wheelchair Mobility     Tilt Bed    Modified Rankin (Stroke Patients Only)       Balance Overall balance assessment: Needs assistance Sitting-balance support: Feet supported Sitting balance-Leahy Scale: Good     Standing balance support: Bilateral upper extremity supported Standing balance-Leahy Scale: Poor                              Communication Communication Communication: No apparent difficulties  Cognition Arousal: Alert Behavior During Therapy: WFL for tasks assessed/performed   PT - Cognitive impairments: No apparent impairments                         Following commands: Intact      Cueing Cueing Techniques: Verbal cues  Exercises General Exercises - Lower Extremity Ankle Circles/Pumps: 10 reps Quad Sets: 10 reps Long Arc Quad: 10 reps Heel Slides: 10 reps, AAROM Hip ABduction/ADduction: 10 reps, AAROM    General Comments  Pertinent Vitals/Pain Pain Assessment Pain Assessment: Faces Faces Pain Scale: Hurts little more Pain Location: L hip Pain Descriptors / Indicators: Operative site guarding Pain Intervention(s): Monitored during session, Limited activity within patient's tolerance, Repositioned    Home Living                          Prior Function            PT Goals (current goals  can now be found in the care plan section) Acute Rehab PT Goals Patient Stated Goal: to get left hip replaced next PT Goal Formulation: With patient Time For Goal Achievement: 08/25/23 Potential to Achieve Goals: Good Progress towards PT goals: Progressing toward goals    Frequency    7X/week      PT Plan      Co-evaluation              AM-PAC PT "6 Clicks" Mobility   Outcome Measure  Help needed turning from your back to your side while in a flat bed without using bedrails?: A Little Help needed moving from lying on your back to sitting on the side of a flat bed without using bedrails?: A Little Help needed moving to and from a bed to a chair (including a wheelchair)?: A Little Help needed standing up from a chair using your arms (e.g., wheelchair or bedside chair)?: A Little Help needed to walk in hospital room?: A Little Help needed climbing 3-5 steps with a railing? : A Little 6 Click Score: 18    End of Session Equipment Utilized During Treatment: Gait belt Activity Tolerance: Patient tolerated treatment well Patient left: in chair;with call bell/phone within reach;with family/visitor present Nurse Communication: Mobility status PT Visit Diagnosis: Other abnormalities of gait and mobility (R26.89);Pain;Difficulty in walking, not elsewhere classified (R26.2) Pain - Right/Left: Right Pain - part of body: Hip     Time: 4098-1191 PT Time Calculation (min) (ACUTE ONLY): 32 min  Charges:    $Gait Training: 8-22 mins $Therapeutic Exercise: 8-22 mins PT General Charges $$ ACUTE PT VISIT: 1 Visit                     [name], PT, DPT Acute Rehabilitation Services Secure Chat Preferred Office: [phone]    [name] 08/12/2023, 2:37 PM

## 2023-08-12 NOTE — Progress Notes (Signed)
 Ambulate patient to the bathroom, patient denies dizziness, Vital sign stable.

## 2023-08-12 NOTE — Progress Notes (Signed)
 Physical Therapy Treatment  Patient Details Name: [name] MRN: [id number] DOB: 1955-03-27 Today's Date: 08/12/2023   History of Present Illness Pt is a 68 y.o. F who presents s/p R direct anterior total hip arthroplasty 5/20. Significant PMH: AVN of bilateral hips, obesity, chronic heart failure, CKD 3a, CAD s/p CABG.    PT Comments  Pt progressing slowly with post-op mobility. She was able to demonstrate transfers and in-room ambulation with gross CGA and RW for support. Pt refuses further mobility due to pain. Pt left in chair with RLE resting on pillow, ice pack placed, and RN notified of pt's desire for pain meds. Will continue to follow.      If plan is discharge home, recommend the following: Assistance with cooking/housework;Assist for transportation   Can travel by private vehicle        Equipment Recommendations  None recommended by PT (pt said friend will provide RW and 3 in1)    Recommendations for Other Services       Precautions / Restrictions Precautions Precautions: Fall Recall of Precautions/Restrictions: Intact Restrictions Weight Bearing Restrictions Per Provider Order: No     Mobility  Bed Mobility Overal bed mobility: Needs Assistance Bed Mobility: Supine to Sit     Supine to sit: Min assist     General bed mobility comments: Assist for RLE advancement towards EOB. Pt with increased time and effort to complete transition.    Transfers Overall transfer level: Needs assistance Equipment used: Rolling walker (2 wheels) Transfers: Sit to/from Stand Sit to Stand: Contact guard assist           General transfer comment: VC's for hand placement on seated surface for safety. Hands on guarding for safety but no assist required for power up to full stand.    Ambulation/Gait Ambulation/Gait assistance: Contact guard assist Gait Distance (Feet): 25 Feet Assistive device: Rolling walker (2 wheels) Gait Pattern/deviations: Step-to pattern,  Step-through pattern, Decreased stance time - right Gait velocity: Decreased Gait velocity interpretation: <1.31 ft/sec, indicative of household ambulator   General Gait Details: In room only to bathroom and back. Pt refuses further ambulation in hall at this time.   Stairs             Wheelchair Mobility     Tilt Bed    Modified Rankin (Stroke Patients Only)       Balance Overall balance assessment: Needs assistance Sitting-balance support: Feet supported Sitting balance-Leahy Scale: Good     Standing balance support: Bilateral upper extremity supported Standing balance-Leahy Scale: Poor                              Communication Communication Communication: No apparent difficulties  Cognition Arousal: Alert Behavior During Therapy: WFL for tasks assessed/performed   PT - Cognitive impairments: No apparent impairments                         Following commands: Intact      Cueing Cueing Techniques: Verbal cues  Exercises      General Comments        Pertinent Vitals/Pain Pain Assessment Pain Assessment: Faces Faces Pain Scale: Hurts even more Pain Location: L hip Pain Descriptors / Indicators: Operative site guarding Pain Intervention(s): Limited activity within patient's tolerance, Monitored during session, Repositioned    Home Living  Prior Function            PT Goals (current goals can now be found in the care plan section) Acute Rehab PT Goals Patient Stated Goal: to get left hip replaced next PT Goal Formulation: With patient Time For Goal Achievement: 08/25/23 Potential to Achieve Goals: Good Progress towards PT goals: Progressing toward goals    Frequency    7X/week      PT Plan      Co-evaluation              AM-PAC PT "6 Clicks" Mobility   Outcome Measure  Help needed turning from your back to your side while in a flat bed without using bedrails?: A  Little Help needed moving from lying on your back to sitting on the side of a flat bed without using bedrails?: A Little Help needed moving to and from a bed to a chair (including a wheelchair)?: A Little Help needed standing up from a chair using your arms (e.g., wheelchair or bedside chair)?: A Little Help needed to walk in hospital room?: A Little Help needed climbing 3-5 steps with a railing? : Total (Pain limiting) 6 Click Score: 16    End of Session   Activity Tolerance: Patient tolerated treatment well Patient left: in chair;with call bell/phone within reach;with family/visitor present Nurse Communication: Mobility status PT Visit Diagnosis: Other abnormalities of gait and mobility (R26.89);Pain;Difficulty in walking, not elsewhere classified (R26.2) Pain - Right/Left: Right Pain - part of body: Hip     Time: 1100-1115 PT Time Calculation (min) (ACUTE ONLY): 15 min  Charges:    $Gait Training: 8-22 mins PT General Charges $$ ACUTE PT VISIT: 1 Visit                     Simone Dubois, PT, DPT Acute Rehabilitation Services Secure Chat Preferred Office: [phone]    [name] 08/12/2023, 11:59 AM

## 2023-08-12 NOTE — Progress Notes (Signed)
 Pt continues with mild to moderate dizziness/ lightheadedness while using the restroom and shortly afterwards. Unable to ambulate pt any further than the restroom at this time. Pt states chronic hypotension. Pt also states high sensitivity to narcotics, however no narcotics were given during this shift. IVF hydration continues.

## 2023-08-12 NOTE — Progress Notes (Signed)
 Subjective: 1 Day Post-Op Procedure(s) (LRB): ARTHROPLASTY, HIP, TOTAL, ANTERIOR APPROACH (Right) Patient reports pain as moderate.  Some hypotension, but apparently she runs low.  Has had fluid bolus.  Objective: Vital signs in last 24 hours: Temp:  [97.4 F (36.3 C)-98.8 F (37.1 C)] 98.8 F (37.1 C) (05/21 0308) Pulse Rate:  [50-87] 74 (05/21 0503) Resp:  [13-20] 20 (05/21 0308) BP: (50-128)/(32-83) 105/47 (05/21 0503) SpO2:  [94 %-100 %] 95 % (05/21 0503)  Intake/Output from previous day: 05/20 0701 - 05/21 0700 In: 2120 [P.O.:1920; IV Piggyback:200] Out: 2300 [Urine:2050; Blood:250] Intake/Output this shift: No intake/output data recorded.  No results for input(s): "HGB" in the last 72 hours. No results for input(s): "WBC", "RBC", "HCT", "PLT" in the last 72 hours. No results for input(s): "NA", "K", "CL", "CO2", "BUN", "CREATININE", "GLUCOSE", "CALCIUM " in the last 72 hours. No results for input(s): "LABPT", "INR" in the last 72 hours.  Sensation intact distally Intact pulses distally Dorsiflexion/Plantar flexion intact Incision: scant drainage   Assessment/Plan: 1 Day Post-Op Procedure(s) (LRB): ARTHROPLASTY, HIP, TOTAL, ANTERIOR APPROACH (Right) Up with therapy Discharge home with home health this afternoon.      [name] 08/12/2023, 7:41 AM

## 2023-08-13 ENCOUNTER — Encounter (HOSPITAL_COMMUNITY): Payer: Self-pay | Admitting: Orthopaedic Surgery

## 2023-08-13 ENCOUNTER — Telehealth: Payer: Self-pay

## 2023-08-13 NOTE — Transitions of Care (Post Inpatient/ED Visit) (Signed)
   08/13/2023  Name: [name] MRN: [id number] DOB: [birth year]  Today's TOC FU Call Status: Today's TOC FU Call Status:: Successful TOC FU Call Completed TOC FU Call Complete Date: 08/13/23 Patient's Name and Date of Birth confirmed.  Transition Care Management Follow-up Telephone Call Date of Discharge: 08/12/23 Discharge Facility: [facility]) Type of Discharge: Inpatient Admission Primary Inpatient Discharge Diagnosis:: Necorisi of right femur How have you been since you were released from the hospital?: Same Any questions or concerns?: No  Items Reviewed: Did you receive and understand the discharge instructions provided?: Yes Medications obtained,verified, and reconciled?: Yes (Medications Reviewed) Any new allergies since your discharge?: No Dietary orders reviewed?: No Do you have support at home?: Yes People in Home [RPT]: friend(s)  Medications Reviewed Today: Medications Reviewed Today   Medications were not reviewed in this encounter     Home Care and Equipment/Supplies: Were Home Health Services Ordered?: Yes Name of Home Health Agency:: [facility] Has Agency set up a time to come to your home?: Yes First Home Health Visit Date: 08/14/23 Any new equipment or medical supplies ordered?: Yes Name of Medical supply agency?: Provided at hospital, walker and bedside toilet Were you able to get the equipment/medical supplies?: Yes Do you have any questions related to the use of the equipment/supplies?: No  Functional Questionnaire: Do you need assistance with bathing/showering or dressing?: Yes Do you need assistance with meal preparation?: Yes Do you need assistance with eating?: No Do you have difficulty maintaining continence: No Do you need assistance with getting out of bed/getting out of a chair/moving?: Yes Do you have difficulty managing or taking your medications?: No  Follow up appointments reviewed: PCP Follow-up appointment  confirmed?: No MD Provider Line [phone] Given: Yes Specialist Hospital Follow-up appointment confirmed?: Yes Date of Specialist follow-up appointment?: 08/25/23 Follow-Up Specialty Provider:: Layton Prima Do you need transportation to your follow-up appointment?: No Do you understand care options if your condition(s) worsen?: Yes-patient verbalized understanding    SIGNATURE

## 2023-08-13 NOTE — Anesthesia Postprocedure Evaluation (Signed)
 Anesthesia Post Note  Patient: [name]  Procedure(s) Performed: ARTHROPLASTY, HIP, TOTAL, ANTERIOR APPROACH (Right: Hip)     Patient location during evaluation: PACU Anesthesia Type: MAC Level of consciousness: awake and alert Pain management: pain level controlled Vital Signs Assessment: post-procedure vital signs reviewed and stable Respiratory status: spontaneous breathing, nonlabored ventilation, respiratory function stable and patient connected to nasal cannula oxygen Cardiovascular status: stable and blood pressure returned to baseline Postop Assessment: no apparent nausea or vomiting Anesthetic complications: no   No notable events documented.  Last Vitals:  Vitals:   08/12/23 0503 08/12/23 0759  BP: (!) 105/47 (!) 101/56  Pulse: 74 80  Resp:  18  Temp:  37.7 C  SpO2: 95% 96%    Last Pain:  Vitals:   08/12/23 1123  TempSrc:   PainSc: 3    Pain Goal: Patients Stated Pain Goal: 2 (08/11/23 1905)                 [name]

## 2023-08-13 NOTE — Discharge Summary (Signed)
 Patient ID: [name] MRN: [id number] DOB/AGE: 1955-10-09 68 y.o.  Admit date: 08/11/2023 Discharge date: 08/12/2023  Admission Diagnoses:  Principal Problem:   Avascular necrosis of bone of right hip [facility]) Active Problems:   Status post total replacement of right hip   Discharge Diagnoses:  Same  Past Medical History:  Diagnosis Date   Arthritis    Back pain    Bilateral swelling of feet and ankles    no current problems per pt 07/30/23   CAD (coronary artery disease)    a. remote stents/angioplasty. b. s/p CABG 01/2017 with mitral valve annuloplasty.   Chest pain    Constipation    Diastolic heart failure (HCC)    Esophageal reflux    Gallbladder problem    GERD (gastroesophageal reflux disease)    History of MI (myocardial infarction)    Hyperlipidemia    Hypertension    Hypothyroidism    Immune thrombocytopenic purpura (HCC)    Joint pain    Lupus erythematosus    in remission since 2013   Mitral valve insufficiency and aortic valve insufficiency    Myocardial infarction (HCC)    x 2   Pneumonia    x several   PONV (postoperative nausea and vomiting)    Postoperative atrial fibrillation (HCC)    Pre-diabetes    Renal disease    stage 3   Severe mitral regurgitation    a. s/p MV annuloplasty 01/2017 at time of CABG.   Shortness of breath on exertion    no current problems - lost weight, stop smoking and hgb wnl in 02/2023   Sleep apnea    does not use CPAP - patient states "mild", lost weight   Status post mitral valve annuloplasty    Unspecified disease of pericardium     Surgeries: Procedure(s): ARTHROPLASTY, HIP, TOTAL, ANTERIOR APPROACH on 08/11/2023   Consultants:   Discharged Condition: Improved  Hospital Course: Davielle Lingelbach is an 68 y.o. female who was admitted 08/11/2023 for operative treatment ofAvascular necrosis of bone of right hip (HCC). Patient has severe unremitting pain that affects sleep, daily activities, and  work/hobbies. After pre-op clearance the patient was taken to the operating room on 08/11/2023 and underwent  Procedure(s): ARTHROPLASTY, HIP, TOTAL, ANTERIOR APPROACH.    Patient was given perioperative antibiotics:  Anti-infectives (From admission, onward)    Start     Dose/Rate Route Frequency Ordered Stop   08/11/23 1115  ceFAZolin (ANCEF) IVPB 2g/100 mL premix        2 g 200 mL/hr over 30 Minutes Intravenous Every 6 hours 08/11/23 1102 08/12/23 0816   08/11/23 0600  ceFAZolin (ANCEF) IVPB 2g/100 mL premix        2 g 200 mL/hr over 30 Minutes Intravenous On call to O.R. 08/11/23 0541 08/11/23 0739        Patient was given sequential compression devices, early ambulation, and chemoprophylaxis to prevent DVT.  Patient benefited maximally from hospital stay and there were no complications.    Recent vital signs: Patient Vitals for the past 24 hrs:  BP Temp Temp src Pulse Resp SpO2  08/12/23 0759 (!) 101/56 99.8 F (37.7 C) Oral 80 18 96 %     Recent laboratory studies:  Recent Labs    08/12/23 0720  WBC 6.0  HGB 11.0*  HCT 32.3*  PLT 134*  NA 139  K 4.1  CL 109  CO2 22  BUN 14  CREATININE 1.17*  GLUCOSE 109*  CALCIUM  7.7*  Discharge Medications:   Allergies as of 08/12/2023       Reactions   Amlodipine  Swelling   Leg edema   Cellcept  [mycophenolate ] Nausea Only        Medication List     TAKE these medications    albuterol  108 (90 Base) MCG/ACT inhaler Commonly known as: VENTOLIN  HFA Inhale 2 puffs into the lungs every 6 (six) hours as needed for wheezing or shortness of breath.   aspirin  81 MG chewable tablet Chew 1 tablet (81 mg total) by mouth 2 (two) times daily. What changed: when to take this   azaTHIOprine  50 MG tablet Commonly known as: IMURAN  TAKE 1 TABLET BY MOUTH IN THE MORNING AND IN THE EVENING   cyclobenzaprine  5 MG tablet Commonly known as: FLEXERIL  Take 1 tablet (5 mg total) by mouth 3 (three) times daily as needed.    dapagliflozin  propanediol 10 MG Tabs tablet Commonly known as: Farxiga  Take 1 tablet (10 mg total) by mouth daily.   fluticasone  50 MCG/ACT nasal spray Commonly known as: FLONASE  SPRAY 2 SPRAYS INTO EACH NOSTRIL EVERY DAY   furosemide  20 MG tablet Commonly known as: LASIX  TAKE 1 TABLET BY MOUTH EVERY DAY   hydroxychloroquine  200 MG tablet Commonly known as: PLAQUENIL  Take 200 mg by mouth 2 (two) times daily.   isosorbide  mononitrate 30 MG 24 hr tablet Commonly known as: IMDUR  Take 1 tablet (30 mg total) by mouth daily.   levothyroxine  75 MCG tablet Commonly known as: SYNTHROID  TAKE 1 TABLET BY MOUTH DAILY BEFORE BREAKFAST.   metFORMIN  500 MG tablet Commonly known as: GLUCOPHAGE  Take 1 tablet (500 mg total) by mouth 2 (two) times daily with a meal.   metoprolol  succinate 50 MG 24 hr tablet Commonly known as: TOPROL -XL Take 1 tablet (50 mg total) by mouth daily.   nitroGLYCERIN  0.4 MG SL tablet Commonly known as: NITROSTAT  Place 1 tablet (0.4 mg total) under the tongue every 5 (five) minutes as needed for chest pain.   oxyCODONE  5 MG immediate release tablet Commonly known as: Oxy IR/ROXICODONE  Take 1-2 tablets (5-10 mg total) by mouth every 6 (six) hours as needed for moderate pain (pain score 4-6) (pain score 4-6; 5 mg for pain score 4-5, 10 mg for pain score 6).   pantoprazole  40 MG tablet Commonly known as: PROTONIX  Take 1 tablet (40 mg total) by mouth daily.   polyethylene glycol powder 17 GM/SCOOP powder Commonly known as: GLYCOLAX /MIRALAX  17 gram po BID prn constipation   potassium chloride  SA 20 MEQ tablet Commonly known as: KLOR-CON  M Take 1 tablet (20 mEq total) by mouth daily.   pregabalin 50 MG capsule Commonly known as: LYRICA Take 50 mg by mouth 2 (two) times daily as needed (pain).   rosuvastatin  20 MG tablet Commonly known as: CRESTOR  Take 1 tablet (20 mg total) by mouth daily.   tirzepatide  10 MG/0.5ML Pen Commonly known as:  MOUNJARO  Inject 10 mg into the skin once a week.        Diagnostic Studies: DG Pelvis Portable Result Date: 08/11/2023 CLINICAL DATA:  Status post right hip arthroplasty. EXAM: PORTABLE PELVIS 1-2 VIEWS COMPARISON:  None Available. FINDINGS: Right hip arthroplasty in expected alignment. No periprosthetic lucency or fracture. Recent postsurgical change includes air and edema in the soft tissues. IMPRESSION: Right hip arthroplasty without immediate postoperative complication. Electronically Signed   By: Chadwick Colonel M.D.   On: 08/11/2023 10:26   DG HIP UNILAT WITH PELVIS 1V RIGHT Result Date: 08/11/2023 CLINICAL DATA:  Elective surgery. EXAM: DG HIP (WITH OR WITHOUT PELVIS) 1V RIGHT COMPARISON:  None Available. FINDINGS: Three fluoroscopic spot views of the pelvis and right hip obtained in the operating room. Images during hip arthroplasty. Fluoroscopy time 24 seconds. Dose 3.48 mGy. IMPRESSION: Intraoperative fluoroscopy during right hip arthroplasty. Electronically Signed   By: Chadwick Colonel M.D.   On: 08/11/2023 10:26   DG C-Arm 1-60 Min-No Report Result Date: 08/11/2023 Fluoroscopy was utilized by the requesting physician.  No radiographic interpretation.    Disposition: Discharge disposition: 01-Home or Self Care          Follow-up Information     [facility] Follow up.   Specialty: Home Health Services Why: for home health services Contact information: [address] [place] [address] [place] [address] [phone]         Arnie Lao, MD Follow up in 2 week(s).   Specialty: Orthopedic Surgery Contact information: [address] [place]  [place] [address] [phone]                  Signed: Arnie Lao 08/13/2023, 7:09 AM

## 2023-08-14 ENCOUNTER — Telehealth: Payer: Self-pay

## 2023-08-14 NOTE — Telephone Encounter (Signed)
 Called patient and LVM to review results.   IMPRESSION: 1. Lung-RADS 3, probably benign findings. Short-term follow-up in 6 months is recommended with repeat low-dose chest CT without contrast (please use the following order, CT CHEST LCS NODULE FOLLOW-UP W/O CM). Subpleural 6.5 mm mean diameter nodule in the subpleural right lower lobe. 2. Aortic atherosclerosis. Dilated ascending aorta at 4.1 cm. Recommend annual imaging followup by CTA or MRA. This recommendation follows 2010 ACCF/AHA/AATS/ACR/ASA/SCA/SCAI/SIR/STS/SVM Guidelines for the Diagnosis and Management of Patients with Thoracic Aortic Disease. Circulation. 2010; 121: Z610-R604. Aortic aneurysm NOS (ICD10-I71.9) 3. Minimal hiatal hernia.

## 2023-08-18 ENCOUNTER — Other Ambulatory Visit: Payer: Self-pay

## 2023-08-18 DIAGNOSIS — R911 Solitary pulmonary nodule: Secondary | ICD-10-CM

## 2023-08-18 DIAGNOSIS — Z122 Encounter for screening for malignant neoplasm of respiratory organs: Secondary | ICD-10-CM

## 2023-08-18 DIAGNOSIS — Z87891 Personal history of nicotine dependence: Secondary | ICD-10-CM

## 2023-08-18 NOTE — Telephone Encounter (Signed)
 Spoke with patient and reviewed recent Lung CT results. She is in agreement to complete a 6 month f/u scan due to evaluate new 6.5 mm nodule as this was her first LCS. Results and plan to PCP. 6 month order placed and schedule for 01/21/2024. Pt had no questions or concerns.

## 2023-08-19 ENCOUNTER — Other Ambulatory Visit: Payer: Self-pay | Admitting: Orthopaedic Surgery

## 2023-08-25 ENCOUNTER — Ambulatory Visit (INDEPENDENT_AMBULATORY_CARE_PROVIDER_SITE_OTHER): Admitting: Physician Assistant

## 2023-08-25 ENCOUNTER — Encounter: Payer: Self-pay | Admitting: Physician Assistant

## 2023-08-25 DIAGNOSIS — Z96641 Presence of right artificial hip joint: Secondary | ICD-10-CM

## 2023-08-25 DIAGNOSIS — M87052 Idiopathic aseptic necrosis of left femur: Secondary | ICD-10-CM

## 2023-08-25 MED ORDER — HYDROCODONE-ACETAMINOPHEN 5-325 MG PO TABS: 1.0000 | ORAL_TABLET | Freq: Four times a day (QID) | ORAL | 0 refills | Status: DC | PRN

## 2023-08-25 MED ORDER — PREGABALIN 50 MG PO CAPS: 50.0000 mg | ORAL_CAPSULE | Freq: Two times a day (BID) | ORAL | 1 refills | Status: DC | PRN

## 2023-08-25 NOTE — Progress Notes (Signed)
 HPI: [name] returns today status post right total hip arthroplasty 08/11/2023.  She has been on aspirin  for DVT prophylaxis.  She been taking Percocet and Tylenol  for pain.  She is asking about switching to Norco.  She also needs a refill on her Lyrica .  She denies any fevers chills.  She is using a cane.  Ranks her pain to be severe at times.  However she is wanting to schedule left total hip arthroplasty for sometime in late August for the first week of September.  She does note that she has leg length discrepancy and has questions about this.  Review of systems: See HPI otherwise negative  Physical exam: General Well-developed well-nourished female no acute distress walks with a cane slight antalgic gait. Right hip: Surgical incisions healing well.  Slight abrasion most proximal portion of the incision but no dehiscence.  Nylon sutures removed Steri-Strips applied.  Calf supple nontender.  Dorsiflexion plantarflexion right ankle intact.  Leg length discrepancy of approximately 1 cm with the right leg being longer than the left.  Impression: Status post right total hip arthroplasty  Plan per patient's request she is to return to work on 08/31/2023 full duties.  Refills of medications given.  She will go back on her 81 mg aspirin  that she was taking prior to surgery.  Will see her back in 2 weeks for wound check.  She will wash the wound with an antibacterial soap and apply a small amount of mupirocin ointment which she was given.  Questions were encouraged and answered at length.

## 2023-08-29 ENCOUNTER — Other Ambulatory Visit (INDEPENDENT_AMBULATORY_CARE_PROVIDER_SITE_OTHER): Payer: Self-pay | Admitting: Family Medicine

## 2023-08-29 DIAGNOSIS — E88819 Insulin resistance, unspecified: Secondary | ICD-10-CM

## 2023-08-30 ENCOUNTER — Encounter: Payer: Self-pay | Admitting: Orthopaedic Surgery

## 2023-08-31 ENCOUNTER — Telehealth: Payer: Self-pay | Admitting: Orthopaedic Surgery

## 2023-08-31 NOTE — Telephone Encounter (Signed)
 Patient called regarding severe hip pain. Would like to be seen ASAP for this. Wants to get an xray and make sure her hip has not "dislocated" Please call patient with an appointment

## 2023-09-01 ENCOUNTER — Encounter (HOSPITAL_BASED_OUTPATIENT_CLINIC_OR_DEPARTMENT_OTHER): Payer: Self-pay | Admitting: Student

## 2023-09-01 ENCOUNTER — Ambulatory Visit (HOSPITAL_BASED_OUTPATIENT_CLINIC_OR_DEPARTMENT_OTHER): Admitting: Student

## 2023-09-01 ENCOUNTER — Telehealth: Payer: Self-pay

## 2023-09-01 ENCOUNTER — Ambulatory Visit (HOSPITAL_BASED_OUTPATIENT_CLINIC_OR_DEPARTMENT_OTHER)

## 2023-09-01 DIAGNOSIS — Z96641 Presence of right artificial hip joint: Secondary | ICD-10-CM

## 2023-09-01 NOTE — Telephone Encounter (Signed)
 Pt called triage and demanded that she needs to be seen today.Dr Lucienne Ryder did a left tha on 5/20 Concerned for dislocation. I scheduled her to come see Cleora Daft today for a xray. She is scheduled to see Dr. Lucienne Ryder tomorrow. Advised to be there by 4:30 at the latest

## 2023-09-01 NOTE — Progress Notes (Signed)
 Post Operative Evaluation    Procedure/Date of Surgery: Right total hip arthroplasty 08/11/2023  Interval History:   Patient presents to clinic 3 weeks status post right hip total arthroplasty with Dr. Lucienne Ryder.  She reports that recovery had been going well up until about 3 days ago, when her hip and leg began becoming severely painful.  She states she was walking prior to this with little assistance from the walker however now is not able to weight-bear.  She is concerned that the hip may be dislocated.  She has been taking hydrocodone  occasionally for pain as well as 81 mg aspirin .  She does note some pain in the right calf and describes experiencing spasms which are worse at night.  She does report a history of a DVT and states that the feeling in her leg does feel similar.   PMH/PSH/Family History/Social History/Meds/Allergies:    Past Medical History:  Diagnosis Date   Arthritis    Back pain    Bilateral swelling of feet and ankles    no current problems per pt 07/30/23   CAD (coronary artery disease)    a. remote stents/angioplasty. b. s/p CABG 01/2017 with mitral valve annuloplasty.   Chest pain    Constipation    Diastolic heart failure (HCC)    Esophageal reflux    Gallbladder problem    GERD (gastroesophageal reflux disease)    History of MI (myocardial infarction)    Hyperlipidemia    Hypertension    Hypothyroidism    Immune thrombocytopenic purpura (HCC)    Joint pain    Lupus erythematosus    in remission since 2013   Mitral valve insufficiency and aortic valve insufficiency    Myocardial infarction (HCC)    x 2   Pneumonia    x several   PONV (postoperative nausea and vomiting)    Postoperative atrial fibrillation (HCC)    Pre-diabetes    Renal disease    stage 3   Severe mitral regurgitation    a. s/p MV annuloplasty 01/2017 at time of CABG.   Shortness of breath on exertion    no current problems - lost weight, stop  smoking and hgb wnl in 02/2023   Sleep apnea    does not use CPAP - patient states "mild", lost weight   Status post mitral valve annuloplasty    Unspecified disease of pericardium    Past Surgical History:  Procedure Laterality Date   BREAST BIOPSY Right 06/03/2023   MM RT BREAST BX W LOC DEV 1ST LESION IMAGE BX SPEC STEREO GUIDE 06/03/2023 [facility] MAMMOGRAPHY   CHOLECYSTECTOMY, LAPAROSCOPIC  2010   CORONARY ARTERY BYPASS GRAFT N/A 01/30/2017   Procedure: CORONARY ARTERY BYPASS GRAFTING (CABG), Times four  using the right saphaneous vein, harvested endoscopicly.  and left internal mammary artery .;  Surgeon: Bartley Lightning, MD;  Location: [facility] OR;  Service: Open Heart Surgery;  Laterality: N/A;   LEFT HEART CATH AND CORONARY ANGIOGRAPHY N/A 01/23/2017   Procedure: LEFT HEART CATH AND CORONARY ANGIOGRAPHY;  Surgeon: Arty Binning, MD;  Location: [facility] INVASIVE CV LAB;  Service: Cardiovascular;  Laterality: N/A;   MITRAL VALVE REPAIR N/A 01/30/2017   Procedure: MITRAL VALVE REPAIR (MVR);  Surgeon: Bartley Lightning, MD;  Location: [facility] OR;  Service: Open Heart Surgery;  Laterality: N/A;  plastic surgical repair      dog bite to leg   RIGHT/LEFT HEART CATH AND CORONARY/GRAFT ANGIOGRAPHY N/A 02/26/2021   Procedure: RIGHT/LEFT HEART CATH AND CORONARY/GRAFT ANGIOGRAPHY;  Surgeon: Arty Binning, MD;  Location: [facility] INVASIVE CV LAB;  Service: Cardiovascular;  Laterality: N/A;   SPLENECTOMY  5-04   TEE WITHOUT CARDIOVERSION N/A 01/27/2017   Procedure: TRANSESOPHAGEAL ECHOCARDIOGRAM (TEE);  Surgeon: Luana Rumple, MD;  Location: [facility] ENDOSCOPY;  Service: Cardiovascular;  Laterality: N/A;   TEE WITHOUT CARDIOVERSION N/A 01/30/2017   Procedure: TRANSESOPHAGEAL ECHOCARDIOGRAM (TEE);  Surgeon: Bartley Lightning, MD;  Location: [facility] OR;  Service: Open Heart Surgery;  Laterality: N/A;   TOTAL HIP ARTHROPLASTY Right 08/11/2023   Procedure: ARTHROPLASTY, HIP, TOTAL, ANTERIOR APPROACH;  Surgeon: Arnie Lao, MD;   Location: [facility] OR;  Service: Orthopedics;  Laterality: Right;   ULTRASOUND GUIDANCE FOR VASCULAR ACCESS  01/23/2017   Procedure: Ultrasound Guidance For Vascular Access;  Surgeon: Arty Binning, MD;  Location: [facility] INVASIVE CV LAB;  Service: Cardiovascular;;   Social History   Socioeconomic History   Marital status: Divorced    Spouse name: Not on file   Number of children: 2   Years of education: Not on file   Highest education level: Master's degree (e.g., MA, MS, MEng, MEd, MSW, MBA)  Occupational History   OccupationPassenger transport manager    Employer: [employer]   Tobacco Use   Smoking status: Former    Current packs/day: 0.00    Average packs/day: 1 pack/day for 30.0 years (30.0 ttl pk-yrs)    Types: Cigarettes    Start date: 03/23/1986    Quit date: 12/23/2015    Years since quitting: 7.6    Passive exposure: Past   Smokeless tobacco: Never  Vaping Use   Vaping status: Never Used  Substance and Sexual Activity   Alcohol use: No   Drug use: No   Sexual activity: Not Currently    Birth control/protection: Post-menopausal    Comment: 1st intercourse 68 yo-More than 5 partners  Other Topics Concern   Not on file  Social History Narrative   HSG,  graduated from [employer] in [place]. In college [name] -grad '13 with relgious studies. [employer] - Fall '13 for MA-divinity. Marrried '73 - 2 years/ divorced. 2 son- '73, '75 - CP.    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    Social Drivers of Corporate investment banker Strain: Low Risk  (06/16/2023)   Overall Financial Resource Strain (CARDIA)    Difficulty of Paying Living Expenses: Not hard at all  Food Insecurity: No Food Insecurity (06/16/2023)   Hunger Vital Sign    Worried About Running Out of Food in the Last Year: Never true    Ran Out of Food in the Last Year: Never true   Transportation Needs: No Transportation Needs (06/16/2023)   PRAPARE - Administrator, Civil Service (Medical): No    Lack of Transportation (Non-Medical): No  Physical Activity: Sufficiently Active (06/16/2023)   Exercise Vital Sign    Days of Exercise per Week: 3 days    Minutes of Exercise per Session: 60 min  Stress: No Stress Concern Present (06/16/2023)   [employer] of Occupational Health - Occupational Stress Questionnaire    Feeling of Stress : Not at all  Social Connections: Moderately Integrated (06/16/2023)  Social Advertising account executive [NHANES]    Frequency of Communication with Friends and Family: More than three times a week    Frequency of Social Gatherings with Friends and Family: More than three times a week    Attends Religious Services: More than 4 times per year    Active Member of [employer] or Organizations: Yes    Attends Engineer, structural: More than 4 times per year    Marital Status: Divorced   Family History  Problem Relation Age of Onset   Heart disease Mother    Hyperlipidemia Mother    Cancer Mother        breast and cervical   Paget's disease of bone Mother    Fibromyalgia Mother    Breast cancer Mother    Hypertension Father    Heart disease Father        PVD   GER disease Father    Breast cancer Sister    Heart attack Maternal Grandmother    Cancer Paternal Grandfather        colon   Allergies  Allergen Reactions   Amlodipine  Swelling    Leg edema   Cellcept  [Mycophenolate ] Nausea Only   Current Outpatient Medications  Medication Sig Dispense Refill   albuterol  (VENTOLIN  HFA) 108 (90 Base) MCG/ACT inhaler Inhale 2 puffs into the lungs every 6 (six) hours as needed for wheezing or shortness of breath. 8 g 0   azaTHIOprine  (IMURAN ) 50 MG tablet TAKE 1 TABLET BY MOUTH IN THE MORNING AND IN THE EVENING 180 tablet 0   [facility] ASPIRIN  ADULT LOW DOSE 81 MG chewable tablet CHEW 1 TABLET (81 MG TOTAL) BY MOUTH 2 (TWO)  TIMES DAILY. 180 tablet 1   cyclobenzaprine  (FLEXERIL ) 5 MG tablet Take 1 tablet (5 mg total) by mouth 3 (three) times daily as needed. 30 tablet 0   dapagliflozin  propanediol (FARXIGA ) 10 MG TABS tablet Take 1 tablet (10 mg total) by mouth daily. 90 tablet 3   fluticasone  (FLONASE ) 50 MCG/ACT nasal spray SPRAY 2 SPRAYS INTO EACH NOSTRIL EVERY DAY 48 mL 2   furosemide  (LASIX ) 20 MG tablet TAKE 1 TABLET BY MOUTH EVERY DAY 90 tablet 3   HYDROcodone -acetaminophen  (NORCO/VICODIN) 5-325 MG tablet Take 1-2 tablets by mouth every 6 (six) hours as needed for moderate pain (pain score 4-6). 30 tablet 0   hydroxychloroquine  (PLAQUENIL ) 200 MG tablet Take 200 mg by mouth 2 (two) times daily.     isosorbide  mononitrate (IMDUR ) 30 MG 24 hr tablet Take 1 tablet (30 mg total) by mouth daily. 90 tablet 3   levothyroxine  (SYNTHROID ) 75 MCG tablet TAKE 1 TABLET BY MOUTH DAILY BEFORE BREAKFAST. 90 tablet 0   metFORMIN  (GLUCOPHAGE ) 500 MG tablet Take 1 tablet (500 mg total) by mouth 2 (two) times daily with a meal. 60 tablet 0   metoprolol  succinate (TOPROL -XL) 50 MG 24 hr tablet Take 1 tablet (50 mg total) by mouth daily. 90 tablet 3   nitroGLYCERIN  (NITROSTAT ) 0.4 MG SL tablet Place 1 tablet (0.4 mg total) under the tongue every 5 (five) minutes as needed for chest pain. 25 tablet 3   pantoprazole  (PROTONIX ) 40 MG tablet Take 1 tablet (40 mg total) by mouth daily. 90 tablet 3   polyethylene glycol powder (GLYCOLAX /MIRALAX ) 17 GM/SCOOP powder 17 gram po BID prn constipation 850 g 0   potassium chloride  SA (KLOR-CON  M) 20 MEQ tablet Take 1 tablet (20 mEq total) by mouth daily. 30 tablet 0   pregabalin  (  LYRICA ) 50 MG capsule Take 1 capsule (50 mg total) by mouth 2 (two) times daily as needed (pain). 60 capsule 1   rosuvastatin  (CRESTOR ) 20 MG tablet Take 1 tablet (20 mg total) by mouth daily. 90 tablet 3   tirzepatide  (MOUNJARO ) 10 MG/0.5ML Pen Inject 10 mg into the skin once a week. 2 mL 0   No current  facility-administered medications for this visit.   No results found.  Review of Systems:   A ROS was performed including pertinent positives and negatives as documented in the HPI.   Musculoskeletal Exam:    There were no vitals taken for this visit.  Right hip incision is well-appearing without evidence of erythema or drainage with some Steri-Strips still in place.  Patient is nonweightbearing.  Calf is supple and mildly tender with negative Homans.  Imaging:   Xray (AP pelvis): Right hip total arthroplasty components in good positioning without any notable changes compared to prior x-rays on 5/20   I personally reviewed and interpreted the radiographs.   Assessment:   Patient is 3 weeks status post right THA.  3 days ago she began developing significant pain within the hip, knee, and lower leg which has made it extremely difficult to weight-bear.  Patient is concerned given her sudden regression.  X-rays today show no significant changes of the replacement components.  Incision is well-appearing without signs of infection or wound complication.  She has been taking 81 mg aspirin  although does reportedly have a previous history of DVT, therefore I will plan to go ahead and order a Doppler ultrasound of the right lower extremity.  She already has a follow-up scheduled with Dr. Lucienne Ryder tomorrow morning, so he will likely be able to reassess prior to this and determine if study is still needed for DVT rule out.  Plan :    - Order placed for right lower extremity Doppler ultrasound for DVT rule out - Follow-up with Dr. Lucienne Ryder tomorrow as scheduled      I personally saw and evaluated the patient, and participated in the management and treatment plan.  Sharrell Deck, PA-C Orthopedics

## 2023-09-02 ENCOUNTER — Ambulatory Visit (INDEPENDENT_AMBULATORY_CARE_PROVIDER_SITE_OTHER): Admitting: Orthopaedic Surgery

## 2023-09-02 ENCOUNTER — Other Ambulatory Visit (INDEPENDENT_AMBULATORY_CARE_PROVIDER_SITE_OTHER): Payer: Self-pay | Admitting: Family Medicine

## 2023-09-02 ENCOUNTER — Ambulatory Visit (HOSPITAL_COMMUNITY)
Admission: RE | Admit: 2023-09-02 | Discharge: 2023-09-02 | Disposition: A | Discharge status: Home / Self Care | Source: Ambulatory Visit | Attending: Vascular Surgery | Admitting: Vascular Surgery

## 2023-09-02 DIAGNOSIS — E88819 Insulin resistance, unspecified: Secondary | ICD-10-CM

## 2023-09-02 DIAGNOSIS — Z96641 Presence of right artificial hip joint: Secondary | ICD-10-CM | POA: Diagnosis not present

## 2023-09-02 NOTE — Progress Notes (Signed)
 The patient is an active 68 year old female who is now 3 weeks status post a right total hip arthroplasty.  She actually saw Sharrell Deck yesterday in his clinic secondary to worsening right hip and lower extremity pain.  She does have a remote history of DVT and she was concerned about the possibility of a DVT.  She does report some shin pain and calf pain.  She is set up for a Doppler ultrasound today.  Her right hip incision looks good.  All the Steri-Strips are now removed.  I tried to aspirate a seroma from her right hip but showed no significant fluid collection fortunately.  Her calf is soft today to me and she has a negative Homans' sign.  She does have good range of motion of her right hip, her right knee and her right foot and ankle.  She is ambulating with a cane.  I told her to go slow and to stop all exercises to allow her hip to really just calm down.  Will see what the Doppler ultrasound shows.  I did see x-rays of her pelvis and right hip yesterday which show well-seated implant with no complicating features.  Once we know the Doppler results we will let her know what treatment is indicated.  She is on Flexeril  and that is helping.  From our standpoint we will keep her regular follow-up appointment in a few weeks but no x-rays are needed.

## 2023-09-07 ENCOUNTER — Ambulatory Visit (INDEPENDENT_AMBULATORY_CARE_PROVIDER_SITE_OTHER): Admitting: Family Medicine

## 2023-09-07 ENCOUNTER — Encounter (INDEPENDENT_AMBULATORY_CARE_PROVIDER_SITE_OTHER): Payer: Self-pay | Admitting: Family Medicine

## 2023-09-07 VITALS — BP 109/73 | HR 67 | Temp 97.8°F | Ht 64.5 in | Wt 216.0 lb

## 2023-09-07 DIAGNOSIS — E88819 Insulin resistance, unspecified: Secondary | ICD-10-CM

## 2023-09-07 DIAGNOSIS — E559 Vitamin D deficiency, unspecified: Secondary | ICD-10-CM | POA: Diagnosis not present

## 2023-09-07 DIAGNOSIS — I10 Essential (primary) hypertension: Secondary | ICD-10-CM | POA: Diagnosis not present

## 2023-09-07 DIAGNOSIS — Z6836 Body mass index (BMI) 36.0-36.9, adult: Secondary | ICD-10-CM

## 2023-09-07 DIAGNOSIS — R632 Polyphagia: Secondary | ICD-10-CM

## 2023-09-07 NOTE — Progress Notes (Signed)
 [name], D.O.  ABFM, ABOM Specializing in Clinical Bariatric Medicine  Office located at: [address] [place], [place]  [address]   Assessment and Plan:  No orders of the defined types were placed in this encounter.  There are no discontinued medications.   No orders of the defined types were placed in this encounter.   FOR THE DISEASE OF OBESITY: BMI 36.0-36.9,adult -- Current BMI 36.52 Morbid obesity (HCC)-starting bmi 41.64/date 04/09/21 Assessment & Plan: Since last office visit with Dr. Verdon on 5/15 patient's muscle mass has increased by 1.2 lbs. Fat mass has decreased by 4.2 lbs. Total body water has decreased by 3.6 lbs.  Counseling done on how various foods will affect these numbers and how to maximize success  Total lbs lost to date: 42 lbs  Total weight loss percentage to date: -16.28%    Recommended Dietary Goals [name] is currently in the action stage of change. As such, her goal is to continue weight management plan.  She has agreed to: continue current plan   Behavioral Intervention We discussed the following today: increasing lean protein intake to established goals, decreasing eating out or consumption of processed foods, and making healthy choices when eating convenient foods, and planning for success  Additional resources provided today: None  Evidence-based interventions for health behavior change were utilized today including the discussion of self monitoring techniques, problem-solving barriers and SMART goal setting techniques.   Regarding patient's less desirable eating habits and patterns, we employed the technique of small changes.   Pt will specifically work on: n/a  Recommended Physical Activity Goals [name] has been advised to work up to 300-450 minutes of moderate intensity aerobic activity a week and strengthening exercises 2-3 times per week for cardiovascular health, weight loss maintenance and preservation of muscle mass.    She has agreed to: Think about enjoyable ways to increase daily physical activity and overcoming barriers to exercise   Pharmacotherapy We both agreed to: Continue with current nutritional and behavioral strategies   ASSOCIATED CONDITIONS ADDRESSED TODAY: Essential hypertension Assessment & Plan: BP Readings from Last 3 Encounters:  09/07/23 109/73  08/12/23 (!) 101/56  08/06/23 112/72   BP at goal today. Compliant with metoprolol  succinate 50 mg daily, Imdur  30 mg daily, Lasix  20 mg daily. Tolerating overall well with no adverse side effects reported. States she stopped in her post surgically has since resumed. At home blood pressures are running 120s/80s. Continue current regimen as advised. Will differ antihypertensive treatment management to cardiology care team as well as surgical orthopedic care team when she plans to have her other hip replaced. Will continue monitoring as it relates to her weight loss journey.    Insulin  resistance with polyphagia and carb cravings Assessment & Plan: Lab Results  Component Value Date   HGBA1C 5.3 03/19/2023   HGBA1C 5.2 04/23/2022   HGBA1C 5.4 12/02/2021   INSULIN  8.3 09/11/2022   INSULIN  16.5 04/23/2022   INSULIN  8.0 12/02/2021    Not currently taking metformin , stating she had concerns it significantly dropped her BP post-op. She has also stopped Mounjaro , stating her orthopedic team does not want her on these medicines prior to her next surgery, which is already scheduled for 10/2023. Pt has an appt with her nephrologist tomorrow. Her nephrologist did not have a problem with her on Metformin  in the past. Had occasional sweets cravings while on Metformin .  Advised patient to follow recommendations of orthopedics, endocrinology, and nephrology specialists. Will continue monitoring alongside  the specialist as it relates to her weight loss journey.   Vitamin D  deficiency Assessment & Plan: Lab Results  Component Value Date   VD25OH 36.7  08/06/2023   VD25OH 90.6 03/19/2023   VD25OH 60.7 09/11/2022   Complaint with ERGO once weekly, good tolerance. Reviewed ideal vit D levels of 50-70. Advised pt to take every 10 days. Plan to recheck vit D in early August. Pt verbalized understanding/agreement to this.    Follow up:   Return in about 4 weeks (around 10/05/2023) for 1 mo f/u. She was informed of the importance of frequent follow up visits to maximize her success with intensive lifestyle modifications for her multiple health conditions.  Subjective:   Chief complaint: Obesity [name] is here to discuss her progress with her obesity treatment plan. She is on the Category 1 Plan and journaling daily intake and states she is following her eating plan approximately 50% of the time. She had a right hip replacement on 5/20 and has not been exercising.   Interval History:  [name] is here for a follow up office visit. Since last OV with Dr. Verdon on 5/15, she is down 3 lbs.  States it was difficult to stay on plan while recovering from her hip replacement. Was trying to be more conscious of what she is eating. Prioritizing her lean protein intake. She has another hip replacement surgery done in early August. She states her right leg is slightly longer than left leg which will be corrected with her next surgery.   Pharmacotherapy for weight loss: She is currently taking no anti-obesity medication.   Review of Systems:  Pertinent positives were addressed with patient today.  Reviewed by clinician on day of visit: allergies, medications, problem list, medical history, surgical history, family history, social history, and previous encounter notes.  Weight Summary and Biometrics   Weight Lost Since Last Visit: 3lb  Weight Gained Since Last Visit: 0lb    Vitals Temp: 97.8 F (36.6 C) BP: 109/73 Pulse Rate: 67 SpO2: 98 %   Anthropometric Measurements Height: 5' 4.5 (1.638 m) Weight: 216 lb (98 kg) BMI  (Calculated): 36.52 Weight at Last Visit: 219lb Weight Lost Since Last Visit: 3lb Weight Gained Since Last Visit: 0lb Starting Weight: 258lb Total Weight Loss (lbs): 42 lb (19.1 kg) Peak Weight: 284lb   Body Composition  Body Fat %: 48.9 % Fat Mass (lbs): 106 lbs Muscle Mass (lbs): 105.2 lbs Total Body Water (lbs): 76.6 lbs Visceral Fat Rating : 15   Other Clinical Data Fasting: No Labs: No Today's Visit #: 29 Starting Date: 04/09/21    Objective:   PHYSICAL EXAM: Blood pressure 109/73, pulse 67, temperature 97.8 F (36.6 C), height 5' 4.5 (1.638 m), weight 216 lb (98 kg), SpO2 98%. Body mass index is 36.5 kg/m.  General: she is overweight, cooperative and in no acute distress. PSYCH: Has normal mood, affect and thought process.   HEENT: EOMI, sclerae are anicteric. Lungs: Normal breathing effort, no conversational dyspnea. Extremities: Moves * 4 Neurologic: A and O * 3, good insight  DIAGNOSTIC DATA REVIEWED: BMET    Component Value Date/Time   NA 139 08/12/2023 0720   NA 141 03/19/2023 0000   K 4.1 08/12/2023 0720   CL 109 08/12/2023 0720   CO2 22 08/12/2023 0720   GLUCOSE 109 (H) 08/12/2023 0720   BUN 14 08/12/2023 0720   BUN 18 03/19/2023 0000   CREATININE 1.17 (H) 08/12/2023 0720   CREATININE 1.17 (H) 02/22/2021  1501   CALCIUM  7.7 (L) 08/12/2023 0720   GFRNONAA 51 (L) 08/12/2023 0720   GFRAA 52 (L) 08/04/2018 0921   Lab Results  Component Value Date   HGBA1C 5.3 03/19/2023   HGBA1C 5.6 01/31/2017   Lab Results  Component Value Date   INSULIN  8.3 09/11/2022   INSULIN  17.2 04/09/2021   Lab Results  Component Value Date   TSH 1.50 03/19/2023   CBC    Component Value Date/Time   WBC 6.0 08/12/2023 0720   RBC 3.28 (L) 08/12/2023 0720   HGB 11.0 (L) 08/12/2023 0720   HCT 32.3 (L) 08/12/2023 0720   PLT 134 (L) 08/12/2023 0720   MCV 98.5 08/12/2023 0720   MCH 33.5 08/12/2023 0720   MCHC 34.1 08/12/2023 0720   RDW 16.4 (H) 08/12/2023  0720   Iron Studies    Component Value Date/Time   IRON 386 06/23/2017 0000   TIBC 285 06/23/2017 0000   FERRITIN 58 06/23/2017 0000   IRONPCTSAT 26 06/23/2017 0000   Lipid Panel     Component Value Date/Time   CHOL 130 03/19/2023 0000   CHOL 134 04/23/2022 1456   TRIG 47 03/19/2023 0000   HDL 49 03/19/2023 0000   HDL 55 04/23/2022 1456   CHOLHDL 3 03/02/2023 1552   VLDL 12.0 03/02/2023 1552   LDLCALC 64 03/19/2023 0000   LDLCALC 67 04/23/2022 1456   LDLCALC 68 11/14/2019 1537   LDLDIRECT 321.8 02/27/2012 1202   Hepatic Function Panel     Component Value Date/Time   PROT 7.2 03/02/2023 1552   PROT 7.2 04/23/2022 1456   ALBUMIN  4.3 03/19/2023 0000   ALBUMIN  4.2 04/23/2022 1456   AST 31 03/19/2023 0000   ALT 22 03/19/2023 0000   ALKPHOS 101 03/19/2023 0000   BILITOT 0.6 03/02/2023 1552   BILITOT 0.4 04/23/2022 1456   BILIDIR 0.2 03/19/2023 0000   BILIDIR 0.10 11/03/2016 1155   IBILI 0.2 11/14/2019 1537      Component Value Date/Time   TSH 1.50 03/19/2023 0000   TSH 1.06 03/02/2023 1552   Nutritional Lab Results  Component Value Date   VD25OH 36.7 08/06/2023   VD25OH 90.6 03/19/2023   VD25OH 60.7 09/11/2022    Attestations:   I, Vernell Forest, acting as a Stage manager for Meghan Jenkins, DO., have compiled all relevant documentation for today's office visit on behalf of Meghan Jenkins, DO, while in the presence of [employer], DO.  Reviewed by clinician on day of visit: allergies, medications, problem list, medical history, surgical history, family history, social history, and previous encounter notes pertinent to patient's obesity diagnosis.  I have reviewed the above documentation for accuracy and completeness, and I agree with the above. Meghan JINNY Adams, D.O.  The 21st Century Cures Act was signed into law in 2016 which includes the topic of electronic health records.  This provides immediate access to information in MyChart.  This includes  consultation notes, operative notes, office notes, lab results and pathology reports.  If you have any questions about what you read please let us  know at your next visit so we can discuss your concerns and take corrective action if need be.  We are right here with you.

## 2023-09-08 ENCOUNTER — Ambulatory Visit (INDEPENDENT_AMBULATORY_CARE_PROVIDER_SITE_OTHER): Admitting: Family Medicine

## 2023-09-09 ENCOUNTER — Encounter: Payer: Self-pay | Admitting: Physician Assistant

## 2023-09-09 ENCOUNTER — Ambulatory Visit (INDEPENDENT_AMBULATORY_CARE_PROVIDER_SITE_OTHER): Admitting: Physician Assistant

## 2023-09-09 DIAGNOSIS — M87052 Idiopathic aseptic necrosis of left femur: Secondary | ICD-10-CM

## 2023-09-09 DIAGNOSIS — Z96641 Presence of right artificial hip joint: Secondary | ICD-10-CM

## 2023-09-09 MED ORDER — OXYCODONE HCL 5 MG PO CAPS: 5.0000 mg | ORAL_CAPSULE | ORAL | 0 refills | Status: DC | PRN

## 2023-09-09 NOTE — Progress Notes (Signed)
 HPI: [name] returns today status post right total hip arthroplasty 08/11/2023.  She is using a cane.  She feels like she is slowly progressing.  Currently ranks her pain to be 4 out of 10 at worst.  She describes pain as being in the groin region.  She notes stiffness when first beginning ambulate.  Notes that the leg length discrepancy also causes her to have some pain in her right knee.  Currently asking for refill on her oxycodone .  She has been taking hydrocodone  at night.  Notes that the Tylenol  and the hydrocodone  causes her to feel sleepy and therefore she wants to take the oxycodone  during the day and not hydrocodone .  Review of systems: See HPI otherwise negative  Physical exam: General Well-developed well-nourished female no acute distress ambulates with a cane. Right hip good range of motion without significant pain.  Right calf supple nontender.  Left hip good range of motion but pain with internal rotation.  Impression: Status post right total hip arthroplasty 08/11/2023 Avascular necrosis left hip  Plan: Will have her continue to work on range of motion strengthening.  Did call in oxycodone  for daytime use this to be the last refill on the oxycodone .  In regards to her left hip we will go ahead and fill out a surgical sheet for left total hip arthroplasty to be performed last week of August or the first week of September.  She understands risk benefits and is she just recently underwent right total hip arthroplasty.  See her back for right total hip follow-up in 1 month.  Questions were encouraged and answered at length.

## 2023-10-01 ENCOUNTER — Other Ambulatory Visit (INDEPENDENT_AMBULATORY_CARE_PROVIDER_SITE_OTHER): Payer: Self-pay | Admitting: Family Medicine

## 2023-10-01 DIAGNOSIS — E88819 Insulin resistance, unspecified: Secondary | ICD-10-CM

## 2023-10-05 ENCOUNTER — Ambulatory Visit (INDEPENDENT_AMBULATORY_CARE_PROVIDER_SITE_OTHER): Admitting: Family Medicine

## 2023-10-07 ENCOUNTER — Ambulatory Visit (INDEPENDENT_AMBULATORY_CARE_PROVIDER_SITE_OTHER): Admitting: Orthopaedic Surgery

## 2023-10-07 ENCOUNTER — Encounter: Payer: Self-pay | Admitting: Orthopaedic Surgery

## 2023-10-07 DIAGNOSIS — Z96641 Presence of right artificial hip joint: Secondary | ICD-10-CM

## 2023-10-07 DIAGNOSIS — M87052 Idiopathic aseptic necrosis of left femur: Secondary | ICD-10-CM

## 2023-10-07 MED ORDER — HYDROCODONE-ACETAMINOPHEN 5-325 MG PO TABS: 1.0000 | ORAL_TABLET | Freq: Three times a day (TID) | ORAL | 0 refills | Status: DC | PRN

## 2023-10-07 NOTE — Progress Notes (Signed)
 The patient is now 8 weeks status post a right total hip arthroplasty to treat avascular necrosis.  She does have AVN in her left hip and is scheduled for a left total hip arthroplasty in early September.  She does have a leg length difference and knows that we will work to correct that and we replaced her left hip.  This has been causing some right knee pain.  She does wear a knee sleeve and brace on the right knee.  Her right hip moves smoothly and fluidly with no significant pain or blocks or rotation.  Her left hip hurts quite a bit with internal and external rotation.  The previous MRI that showed both hips had significant AVN in both hips.  I did offer her a steroid injection in her right knee today which she agreed to and tolerated well.  We will see her on the day of surgery for her left hip replacement and then at her 2-week postoperative visit after that.  All question concerns were answered addressed.

## 2023-10-12 ENCOUNTER — Encounter (INDEPENDENT_AMBULATORY_CARE_PROVIDER_SITE_OTHER): Payer: Self-pay | Admitting: Family Medicine

## 2023-10-12 ENCOUNTER — Ambulatory Visit (INDEPENDENT_AMBULATORY_CARE_PROVIDER_SITE_OTHER): Admitting: Family Medicine

## 2023-10-12 VITALS — BP 124/74 | HR 51 | Temp 97.7°F | Ht 64.5 in | Wt 216.0 lb

## 2023-10-12 DIAGNOSIS — E88819 Insulin resistance, unspecified: Secondary | ICD-10-CM

## 2023-10-12 DIAGNOSIS — Z6836 Body mass index (BMI) 36.0-36.9, adult: Secondary | ICD-10-CM

## 2023-10-12 DIAGNOSIS — F5089 Other specified eating disorder: Secondary | ICD-10-CM | POA: Diagnosis not present

## 2023-10-12 DIAGNOSIS — E559 Vitamin D deficiency, unspecified: Secondary | ICD-10-CM | POA: Diagnosis not present

## 2023-10-12 DIAGNOSIS — F4320 Adjustment disorder, unspecified: Secondary | ICD-10-CM | POA: Diagnosis not present

## 2023-10-12 DIAGNOSIS — E669 Obesity, unspecified: Secondary | ICD-10-CM

## 2023-10-12 MED ORDER — TIRZEPATIDE 5 MG/0.5ML ~~LOC~~ SOAJ
5.0000 mg | SUBCUTANEOUS | 0 refills | Status: DC
Start: 2023-10-12 — End: 2023-11-09

## 2023-10-12 MED ORDER — VITAMIN D (ERGOCALCIFEROL) 1.25 MG (50000 UNIT) PO CAPS: 50000.0000 [IU] | ORAL_CAPSULE | ORAL | 0 refills | Status: DC

## 2023-10-12 NOTE — Progress Notes (Signed)
 [name], D.O.  ABFM, ABOM Specializing in Clinical Bariatric Medicine  Office located at: [address] [place], [place]  [address]   Assessment and Plan:   Medications Discontinued During This Encounter  Medication Reason   metFORMIN  (GLUCOPHAGE ) 500 MG tablet Side effect (s)     Meds ordered this encounter  Medications   tirzepatide  (MOUNJARO ) 5 MG/0.5ML Pen    Sig: Inject 5 mg into the skin once a week.    Dispense:  2 mL    Refill:  0   Vitamin D , Ergocalciferol , (DRISDOL ) 1.25 MG (50000 UNIT) CAPS capsule    Sig: Take 1 capsule (50,000 Units total) by mouth every 7 (seven) days.    Dispense:  4 capsule    Refill:  0    60 d supply;  ** OV for RF **   Do not send RF request     Recheck Vit D next OV   FOR THE DISEASE OF OBESITY:  BMI 36.0-36.9,adult Obesity (HCC)-starting bmi 41.64 Assessment & Plan: Since last office visit on 09/07/2023 patient's muscle mass has decreased by 1.6 lbs. Fat mass has increased by 1.4 lbs. Total body water has decreased by 0.8 lbs.  Counseling done on how various foods will affect these numbers and how to maximize success  Total lbs lost to date: - 42 lbs Total weight loss percentage to date: -16.28 %   Recommended Dietary Goals [name] is currently in the action stage of change. As such, her goal is to continue weight management plan.  She has agreed to: continue current plan   Behavioral Intervention We discussed the following today: increasing lean protein intake to established goals, decreasing simple carbohydrates, using CHAT-GPT for recipe ideas, and work on managing stress/creating time for self-care  Additional resources provided today: None  Evidence-based interventions for health behavior change were utilized today including the discussion of self monitoring techniques, problem-solving barriers and SMART goal setting techniques.   Regarding patient's less desirable eating habits and patterns, we employed  the technique of small changes.   Pt will specifically work on: n/a   Recommended Physical Activity Goals [name] has been advised to work up to 300-450 minutes of moderate intensity aerobic activity a week and strengthening exercises 2-3 times per week for cardiovascular health, weight loss maintenance and preservation of muscle mass.   She has agreed to: continue to gradually increase the amount and intensity of exercise routine   Pharmacotherapy Pt states she has not taken Mounjaro  at all this year. She struggles with cravings and has been eating more comfort foods. Plan: RESTART Mounjaro  at 5 mg weekly per patient request. Potential risks/ benefits of the medication were explained to patient. Explained that the more she eats off-plan, the more likely for her to have side effects of the drug.     ASSOCIATED CONDITIONS ADDRESSED TODAY:   Grief reaction - emotional eating Assessment & Plan: Her mother died last week from pancreatic and liver cancer. Her father and brother are also in hospice. States she has been doing more comfort eating, but is overall okay mentally. She has a good support network. 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   - Discussed the benefits of obtaining a counselor. - Encouraged exercise (e.g walking) for stress management.  - Continue prudent nutritional plan which can support emotional well-being.  - She declines medications at this time.     Insulin   resistance with polyphagia and carb cravings Assessment & Plan: Lab Results  Component Value Date   HGBA1C 5.3 03/19/2023   HGBA1C 5.2 04/23/2022   HGBA1C 5.4 12/02/2021   INSULIN  8.3 09/11/2022   INSULIN  16.5 04/23/2022   INSULIN  8.0 12/02/2021    Currently managed with diet and life-style interventions. The last time she took Metformin  was back in May; she discontinued it as it caused her blood pressure to lower significantly  -  Continue working on nutrition plan to decrease simple carbohydrates, increase lean proteins and exercise to promote weight loss and prevent progression to pre-dm.     Vitamin D  deficiency Assessment & Plan: Lab Results  Component Value Date   VD25OH 36.7 08/06/2023   VD25OH 90.6 03/19/2023   VD25OH 60.7 09/11/2022   She restarted prescription strength Vit D around 08/06/2023. No acute concerns.   - Continue vit D supplementation and weight loss efforts.  - Consider rechecking levels next OV.    Follow up:   Return 11/09/2023 at 4:00 PM.  She was informed of the importance of frequent follow up visits to maximize her success with intensive lifestyle modifications for her multiple health conditions.   Subjective:   Chief complaint: Obesity [name] is here to discuss her progress with her obesity treatment plan. She is on the Category 1 Plan and journaling daily intake and states she is following her eating plan approximately 80% of the time. She states she is not exercising (delete one)   Interval History:  [name] is here for a follow up office visit. Since last OV on 09/07/2023 , her  weight has not changed. Her mother died last week from pancreatic and liver cancer. Her father and brother are also in hospice. States she has been doing more comfort eating, but is overall okay mentally. She has a good support network. 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    Pharmacotherapy that aid with weight loss:  none   Review of Systems:  Pertinent positives were addressed with patient today.  Reviewed by clinician on day of visit: allergies, medications, problem list, medical history, surgical history, family history, social history, and previous encounter notes.   Weight Summary and Biometrics   Weight Lost Since Last Visit: 0  Weight Gained Since Last Visit: 0   Vitals Temp: 97.7 F (36.5 C) BP:  124/74 Pulse Rate: (!) 51 SpO2: 100 %   Anthropometric Measurements Height: 5' 4.5 (1.638 m) Weight: 216 lb (98 kg) BMI (Calculated): 36.52 Weight at Last Visit: 216 lb Weight Lost Since Last Visit: 0 Weight Gained Since Last Visit: 0 Starting Weight: 258 lb Total Weight Loss (lbs): 42 lb (19.1 kg) Peak Weight: 284 lb   Body Composition  Body Fat %: 49.6 % Fat Mass (lbs): 107.4 lbs Muscle Mass (lbs): 103.6 lbs Total Body Water (lbs): 75.8 lbs Visceral Fat Rating : 15   Other Clinical Data Today's Visit #: 30 Starting Date: 04/09/21 Comments: Cat 1    Objective:   PHYSICAL EXAM: Blood pressure 124/74, pulse (!) 51, temperature 97.7 F (36.5 C), height 5' 4.5 (1.638 m), weight 216 lb (98 kg), SpO2 100%. Body mass index is 36.5 kg/m.  General: she is overweight, cooperative and in no acute distress. PSYCH: Has normal mood, affect and thought process.   HEENT: EOMI, sclerae are anicteric. Lungs: Normal breathing effort, no conversational dyspnea. Extremities: Moves * 4 Neurologic: A and O *  3, good insight  DIAGNOSTIC DATA REVIEWED: BMET    Component Value Date/Time   NA 139 08/12/2023 0720   NA 141 03/19/2023 0000   K 4.1 08/12/2023 0720   CL 109 08/12/2023 0720   CO2 22 08/12/2023 0720   GLUCOSE 109 (H) 08/12/2023 0720   BUN 14 08/12/2023 0720   BUN 18 03/19/2023 0000   CREATININE 1.17 (H) 08/12/2023 0720   CREATININE 1.17 (H) 02/22/2021 1501   CALCIUM  7.7 (L) 08/12/2023 0720   GFRNONAA 51 (L) 08/12/2023 0720   GFRAA 52 (L) 08/04/2018 0921   Lab Results  Component Value Date   HGBA1C 5.3 03/19/2023   HGBA1C 5.6 01/31/2017   Lab Results  Component Value Date   INSULIN  8.3 09/11/2022   INSULIN  17.2 04/09/2021   Lab Results  Component Value Date   TSH 1.50 03/19/2023   CBC    Component Value Date/Time   WBC 6.0 08/12/2023 0720   RBC 3.28 (L) 08/12/2023 0720   HGB 11.0 (L) 08/12/2023 0720   HCT 32.3 (L) 08/12/2023 0720   PLT 134 (L)  08/12/2023 0720   MCV 98.5 08/12/2023 0720   MCH 33.5 08/12/2023 0720   MCHC 34.1 08/12/2023 0720   RDW 16.4 (H) 08/12/2023 0720   Iron Studies    Component Value Date/Time   IRON 386 06/23/2017 0000   TIBC 285 06/23/2017 0000   FERRITIN 58 06/23/2017 0000   IRONPCTSAT 26 06/23/2017 0000   Lipid Panel     Component Value Date/Time   CHOL 130 03/19/2023 0000   CHOL 134 04/23/2022 1456   TRIG 47 03/19/2023 0000   HDL 49 03/19/2023 0000   HDL 55 04/23/2022 1456   CHOLHDL 3 03/02/2023 1552   VLDL 12.0 03/02/2023 1552   LDLCALC 64 03/19/2023 0000   LDLCALC 67 04/23/2022 1456   LDLCALC 68 11/14/2019 1537   LDLDIRECT 321.8 02/27/2012 1202   Hepatic Function Panel     Component Value Date/Time   PROT 7.2 03/02/2023 1552   PROT 7.2 04/23/2022 1456   ALBUMIN  4.3 03/19/2023 0000   ALBUMIN  4.2 04/23/2022 1456   AST 31 03/19/2023 0000   ALT 22 03/19/2023 0000   ALKPHOS 101 03/19/2023 0000   BILITOT 0.6 03/02/2023 1552   BILITOT 0.4 04/23/2022 1456   BILIDIR 0.2 03/19/2023 0000   BILIDIR 0.10 11/03/2016 1155   IBILI 0.2 11/14/2019 1537      Component Value Date/Time   TSH 1.50 03/19/2023 0000   TSH 1.06 03/02/2023 1552   Nutritional Lab Results  Component Value Date   VD25OH 36.7 08/06/2023   VD25OH 90.6 03/19/2023   VD25OH 60.7 09/11/2022    Attestations:   I, Special Puri, acting as a Stage manager for [employer], DO., have compiled all relevant documentation for today's office visit on behalf of Meghan Jenkins, DO, while in the presence of [employer], DO.  I have reviewed the above documentation for accuracy and completeness, and I agree with the above. [name], D.O.  The 21st Century Cures Act was signed into law in 2016 which includes the topic of electronic health records.  This provides immediate access to information in MyChart. This includes consultation notes, operative notes, office notes, lab results and pathology reports.  If you  have any questions about what you read please let us  know at your next visit so we can discuss your concerns and take corrective action if need be. We are right here with you.

## 2023-10-21 ENCOUNTER — Ambulatory Visit: Attending: Cardiology | Admitting: Cardiology

## 2023-10-21 ENCOUNTER — Encounter: Payer: Self-pay | Admitting: Cardiology

## 2023-10-21 VITALS — BP 124/82 | HR 66 | Ht 66.0 in | Wt 214.0 lb

## 2023-10-21 DIAGNOSIS — I739 Peripheral vascular disease, unspecified: Secondary | ICD-10-CM

## 2023-10-21 DIAGNOSIS — Z01810 Encounter for preprocedural cardiovascular examination: Secondary | ICD-10-CM | POA: Diagnosis not present

## 2023-10-21 DIAGNOSIS — I7121 Aneurysm of the ascending aorta, without rupture: Secondary | ICD-10-CM

## 2023-10-21 DIAGNOSIS — I2510 Atherosclerotic heart disease of native coronary artery without angina pectoris: Secondary | ICD-10-CM | POA: Diagnosis not present

## 2023-10-21 DIAGNOSIS — Z9889 Other specified postprocedural states: Secondary | ICD-10-CM

## 2023-10-21 NOTE — Patient Instructions (Signed)
 Medication Instructions:  Your physician recommends that you continue on your current medications as directed. Please refer to the Current Medication list given to you today.  *If you need a refill on your cardiac medications before your next appointment, please call your pharmacy*  Testing/Procedures: Your physician has requested that you have an echocardiogram. Echocardiography is a painless test that uses sound waves to create images of your heart. It provides your doctor with information about the size and shape of your heart and how well your heart's chambers and valves are working. This procedure takes approximately one hour. There are no restrictions for this procedure. Please do NOT wear cologne, perfume, aftershave, or lotions (deodorant is allowed). Please arrive 15 minutes prior to your appointment time.  Please note: We ask at that you not bring children with you during ultrasound (echo/ vascular) testing. Due to room size and safety concerns, children are not allowed in the ultrasound rooms during exams. Our front office staff cannot provide observation of children in our lobby area while testing is being conducted. An adult accompanying a patient to their appointment will only be allowed in the ultrasound room at the discretion of the ultrasound technician under special circumstances. We apologize for any inconvenience.   Follow-Up: At [facility] you and your health needs are our priority.  As part of our continuing mission to provide you with exceptional heart care, our providers are all part of one team.  This team includes your primary Cardiologist (physician) and Advanced Practice Providers or APPs (Physician Assistants and Nurse Practitioners) who all work together to provide you with the care you need, when you need it.  Your next appointment:   9 month(s)  Provider:   Kardie Tobb, DO

## 2023-10-21 NOTE — Progress Notes (Signed)
 Cardiology Office Note:    Date:  10/27/2023   ID:  [name], DOB 1956-01-31, MRN [id number]  PCP:  Joshua Debby CROME, MD  Cardiologist:  Dub Huntsman, DO  Electrophysiologist:  None   Referring MD: Joshua Debby CROME, MD    I am doing fine  History of Present Illness:    [name] is a 68 y.o. female with a hx of lupus erythematous, coronary artery disease with prior history of stenting and ultimately four-vessel bypass in 2018, mitral valve annuloplasty at the time of her CABG in 2018, postoperative atrial fibrillation, peripheral artery disease and ascending aortic aneurysm.   At her last visit on 02/05/2023 at that time she was doing well from a Cv standpoint. No medication changes were done.   She is here today because she is getting ready for surgery.    Past Medical History:  Diagnosis Date   Arthritis    Back pain    Bilateral swelling of feet and ankles    no current problems per pt 07/30/23   CAD (coronary artery disease)    a. remote stents/angioplasty. b. s/p CABG 01/2017 with mitral valve annuloplasty.   Chest pain    Constipation    Diastolic heart failure (HCC)    Esophageal reflux    Gallbladder problem    GERD (gastroesophageal reflux disease)    History of MI (myocardial infarction)    Hyperlipidemia    Hypertension    Hypothyroidism    Immune thrombocytopenic purpura (HCC)    Joint pain    Lupus erythematosus    in remission since 2013   Mitral valve insufficiency and aortic valve insufficiency    Myocardial infarction (HCC)    x 2   Pneumonia    x several   PONV (postoperative nausea and vomiting)    Postoperative atrial fibrillation (HCC)    Pre-diabetes    Renal disease    stage 3   Severe mitral regurgitation    a. s/p MV annuloplasty 01/2017 at time of CABG.   Shortness of breath on exertion    no current problems - lost weight, stop smoking and hgb wnl in 02/2023   Sleep apnea    does not use CPAP - patient states  mild, lost weight   Status post mitral valve annuloplasty    Unspecified disease of pericardium     Past Surgical History:  Procedure Laterality Date   BREAST BIOPSY Right 06/03/2023   MM RT BREAST BX W LOC DEV 1ST LESION IMAGE BX SPEC STEREO GUIDE 06/03/2023 [facility]   CHOLECYSTECTOMY, LAPAROSCOPIC  2010   CORONARY ARTERY BYPASS GRAFT N/A 01/30/2017   Procedure: CORONARY ARTERY BYPASS GRAFTING (CABG), Times four  using the right saphaneous vein, harvested endoscopicly.  and left internal mammary artery .;  Surgeon: Lucas Dorise POUR, MD;  Location: [facility] OR;  Service: Open Heart Surgery;  Laterality: N/A;   LEFT HEART CATH AND CORONARY ANGIOGRAPHY N/A 01/23/2017   Procedure: LEFT HEART CATH AND CORONARY ANGIOGRAPHY;  Surgeon: Claudene Victory ORN, MD;  Location: [facility] INVASIVE CV LAB;  Service: Cardiovascular;  Laterality: N/A;   MITRAL VALVE REPAIR N/A 01/30/2017   Procedure: MITRAL VALVE REPAIR (MVR);  Surgeon: Lucas Dorise POUR, MD;  Location: [facility] OR;  Service: Open Heart Surgery;  Laterality: N/A;   plastic surgical repair      dog bite to leg   RIGHT/LEFT HEART CATH AND CORONARY/GRAFT ANGIOGRAPHY N/A 02/26/2021   Procedure: RIGHT/LEFT HEART CATH AND CORONARY/GRAFT ANGIOGRAPHY;  Surgeon: Claudene Victory ORN, MD;  Location: [facility] INVASIVE CV LAB;  Service: Cardiovascular;  Laterality: N/A;   SPLENECTOMY  5-04   TEE WITHOUT CARDIOVERSION N/A 01/27/2017   Procedure: TRANSESOPHAGEAL ECHOCARDIOGRAM (TEE);  Surgeon: Francyne Headland, MD;  Location: [facility] ENDOSCOPY;  Service: Cardiovascular;  Laterality: N/A;   TEE WITHOUT CARDIOVERSION N/A 01/30/2017   Procedure: TRANSESOPHAGEAL ECHOCARDIOGRAM (TEE);  Surgeon: Lucas Dorise POUR, MD;  Location: [facility] OR;  Service: Open Heart Surgery;  Laterality: N/A;   TOTAL HIP ARTHROPLASTY Right 08/11/2023   Procedure: ARTHROPLASTY, HIP, TOTAL, ANTERIOR APPROACH;  Surgeon: Vernetta Lonni GRADE, MD;  Location: [facility] OR;  Service: Orthopedics;  Laterality: Right;   ULTRASOUND GUIDANCE  FOR VASCULAR ACCESS  01/23/2017   Procedure: Ultrasound Guidance For Vascular Access;  Surgeon: Claudene Victory ORN, MD;  Location: [facility] INVASIVE CV LAB;  Service: Cardiovascular;;    Current Medications: Current Meds  Medication Sig   albuterol  (VENTOLIN  HFA) 108 (90 Base) MCG/ACT inhaler Inhale 2 puffs into the lungs every 6 (six) hours as needed for wheezing or shortness of breath.   azaTHIOprine  (IMURAN ) 50 MG tablet TAKE 1 TABLET BY MOUTH IN THE MORNING AND IN THE EVENING   [facility] ASPIRIN  ADULT LOW DOSE 81 MG chewable tablet CHEW 1 TABLET (81 MG TOTAL) BY MOUTH 2 (TWO) TIMES DAILY.   cyclobenzaprine  (FLEXERIL ) 5 MG tablet Take 1 tablet (5 mg total) by mouth 3 (three) times daily as needed.   dapagliflozin  propanediol (FARXIGA ) 10 MG TABS tablet Take 1 tablet (10 mg total) by mouth daily.   fluticasone  (FLONASE ) 50 MCG/ACT nasal spray SPRAY 2 SPRAYS INTO EACH NOSTRIL EVERY DAY   furosemide  (LASIX ) 20 MG tablet TAKE 1 TABLET BY MOUTH EVERY DAY   HYDROcodone -acetaminophen  (NORCO/VICODIN) 5-325 MG tablet Take 1-2 tablets by mouth 3 (three) times daily as needed for moderate pain (pain score 4-6).   hydroxychloroquine  (PLAQUENIL ) 200 MG tablet Take 200 mg by mouth 2 (two) times daily.   isosorbide  mononitrate (IMDUR ) 30 MG 24 hr tablet Take 1 tablet (30 mg total) by mouth daily.   levothyroxine  (SYNTHROID ) 75 MCG tablet TAKE 1 TABLET BY MOUTH DAILY BEFORE BREAKFAST.   metoprolol  succinate (TOPROL -XL) 50 MG 24 hr tablet Take 1 tablet (50 mg total) by mouth daily.   nitroGLYCERIN  (NITROSTAT ) 0.4 MG SL tablet Place 1 tablet (0.4 mg total) under the tongue every 5 (five) minutes as needed for chest pain.   oxycodone  (OXY-IR) 5 MG capsule Take 1 capsule (5 mg total) by mouth every 4 (four) hours as needed.   pantoprazole  (PROTONIX ) 40 MG tablet Take 1 tablet (40 mg total) by mouth daily.   polyethylene glycol powder (GLYCOLAX /MIRALAX ) 17 GM/SCOOP powder 17 gram po BID prn constipation   potassium chloride  SA  (KLOR-CON  M) 20 MEQ tablet Take 1 tablet (20 mEq total) by mouth daily.   pregabalin  (LYRICA ) 50 MG capsule Take 1 capsule (50 mg total) by mouth 2 (two) times daily as needed (pain).   rosuvastatin  (CRESTOR ) 20 MG tablet Take 1 tablet (20 mg total) by mouth daily.   tirzepatide  (MOUNJARO ) 5 MG/0.5ML Pen Inject 5 mg into the skin once a week.   Vitamin D , Ergocalciferol , (DRISDOL ) 1.25 MG (50000 UNIT) CAPS capsule Take 1 capsule (50,000 Units total) by mouth every 7 (seven) days.     Allergies:   Amlodipine  and Cellcept  [email or web address] ]   Social History   Socioeconomic History   Marital status: Divorced    Spouse name: Not on file   Number of children:  2   Years of education: Not on file   Highest education level: Master's degree (e.g., MA, MS, MEng, MEd, MSW, MBA)  Occupational History   Occupation: Regional Bereavement Care Coordinator    Employer: [employer]   Tobacco Use   Smoking status: Former    Current packs/day: 0.00    Average packs/day: 1 pack/day for 30.0 years (30.0 ttl pk-yrs)    Types: Cigarettes    Start date: 03/23/1986    Quit date: 12/23/2015    Years since quitting: 7.8    Passive exposure: Past   Smokeless tobacco: Never  Vaping Use   Vaping status: Never Used  Substance and Sexual Activity   Alcohol use: No   Drug use: No   Sexual activity: Not Currently    Birth control/protection: Post-menopausal    Comment: 1st intercourse 68 yo-More than 5 partners  Other Topics Concern   Not on file  Social History Narrative   HSG,  graduated from [employer] in [place]. In college [name] -grad '13 with relgious studies. [employer] - Fall '13 for MA-divinity. Marrried '73 - 2 years/ divorced. 2 son- '73, '75 - CP.    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    Social Drivers of Corporate investment banker Strain: Low Risk  (06/16/2023)   Overall  Financial Resource Strain (CARDIA)    Difficulty of Paying Living Expenses: Not hard at all  Food Insecurity: No Food Insecurity (06/16/2023)   Hunger Vital Sign    Worried About Running Out of Food in the Last Year: Never true    Ran Out of Food in the Last Year: Never true  Transportation Needs: No Transportation Needs (06/16/2023)   PRAPARE - Administrator, Civil Service (Medical): No    Lack of Transportation (Non-Medical): No  Physical Activity: Sufficiently Active (06/16/2023)   Exercise Vital Sign    Days of Exercise per Week: 3 days    Minutes of Exercise per Session: 60 min  Stress: No Stress Concern Present (06/16/2023)   [employer] of Occupational Health - Occupational Stress Questionnaire    Feeling of Stress : Not at all  Social Connections: Moderately Integrated (06/16/2023)   Social Connection and Isolation Panel    Frequency of Communication with Friends and Family: More than three times a week    Frequency of Social Gatherings with Friends and Family: More than three times a week    Attends Religious Services: More than 4 times per year    Active Member of [employer] or Organizations: Yes    Attends Engineer, structural: More than 4 times per year    Marital Status: Divorced     Family History: The patient's family history includes Breast cancer in her mother and sister; Cancer in her mother and paternal grandfather; Fibromyalgia in her mother; GER disease in her father; Heart attack in her maternal grandmother; Heart disease in her father and mother; Hyperlipidemia in her mother; Hypertension in her father; Paget's disease of bone in her mother.  ROS:   Review of Systems  Constitution: Negative for decreased appetite, fever and weight gain.  HENT: Negative for congestion, ear discharge, hoarse voice and sore throat.   Eyes: Negative for discharge, redness, vision loss in right eye and visual halos.  Cardiovascular: Negative for chest pain,  dyspnea on exertion, leg swelling, orthopnea and palpitations.  Respiratory:  Negative for cough, hemoptysis, shortness of breath and snoring.   Endocrine: Negative for heat intolerance and polyphagia.  Hematologic/Lymphatic: Negative for bleeding problem. Does not bruise/bleed easily.  Skin: Negative for flushing, nail changes, rash and suspicious lesions.  Musculoskeletal: Negative for arthritis, joint pain, muscle cramps, myalgias, neck pain and stiffness.  Gastrointestinal: Negative for abdominal pain, bowel incontinence, diarrhea and excessive appetite.  Genitourinary: Negative for decreased libido, genital sores and incomplete emptying.  Neurological: Negative for brief paralysis, focal weakness, headaches and loss of balance.  Psychiatric/Behavioral: Negative for altered mental status, depression and suicidal ideas.  Allergic/Immunologic: Negative for HIV exposure and persistent infections.    EKGs/Labs/Other Studies Reviewed:    The following studies were reviewed today:   EKG:  The ekg ordered today demonstrates sinus rhythm,   Recent Labs: 03/19/2023: ALT 22; TSH 1.50 08/12/2023: BUN 14; Creatinine, Ser 1.17; Hemoglobin 11.0; Platelets 134; Potassium 4.1; Sodium 139  Recent Lipid Panel    Component Value Date/Time   CHOL 130 03/19/2023 0000   CHOL 134 04/23/2022 1456   TRIG 47 03/19/2023 0000   HDL 49 03/19/2023 0000   HDL 55 04/23/2022 1456   CHOLHDL 3 03/02/2023 1552   VLDL 12.0 03/02/2023 1552   LDLCALC 64 03/19/2023 0000   LDLCALC 67 04/23/2022 1456   LDLCALC 68 11/14/2019 1537   LDLDIRECT 321.8 02/27/2012 1202    Physical Exam:    VS:  BP 124/82 (BP Location: Right Arm, Patient Position: Sitting, Cuff Size: Large)   Pulse 66   Ht 5' 6 (1.676 m)   Wt 214 lb (97.1 kg)   SpO2 95%   BMI 34.54 kg/m     Wt Readings from Last 3 Encounters:  10/21/23 214 lb (97.1 kg)  10/12/23 216 lb (98 kg)  09/07/23 216 lb (98 kg)     GEN: Well nourished, well developed  in no acute distress HEENT: Normal NECK: No JVD; No carotid bruits LYMPHATICS: No lymphadenopathy CARDIAC: S1S2 noted,RRR, no murmurs, rubs, gallops RESPIRATORY:  Clear to auscultation without rales, wheezing or rhonchi  ABDOMEN: Soft, non-tender, non-distended, +bowel sounds, no guarding. EXTREMITIES: No edema, No cyanosis, no clubbing MUSCULOSKELETAL:  No deformity  SKIN: Warm and dry NEUROLOGIC:  Alert and oriented x 3, non-focal PSYCHIATRIC:  Normal affect, good insight  ASSESSMENT:    1. S/P MVR (mitral valve repair)   2. Coronary artery disease involving native coronary artery of native heart without angina pectoris   3. PAD (peripheral artery disease) (HCC)   4. Aneurysm of ascending aorta without rupture (HCC)   5. Preoperative cardiovascular examination      PLAN:    Ischemic Cardiomyopathy - Stable on current regimen. No new symptoms of heart failure. Patient is active and exercising regularly.  Continue current medications:Aspirin , Farxiga , Isosorbide , Metoprolol , Nitroglycerin , and Crestor .  Hyperlipidemia - continue with current statin medication.  Blood pressure is acceptable, continue with current antihypertensive regimen.  Hyperlipidemia - continue with current statin medication.  S/p mitral valve repair -will order an echocardiogram today to reassess her mitral valve.  She is planned for surgery on September 9: The patient does not have any unstable cardiac conditions.  Upon evaluation today, she can achieve 4 METs or greater without anginal symptoms.  According to [facility] and AHA guidelines, she requires no further cardiac workup prior to her noncardiac surgery and should be at acceptable risk.  Our service is available as necessary in the perioperative period.  The patient is in agreement with the above plan. The  patient left the office in stable condition.  The patient will follow up in 1 year   Medication Adjustments/Labs and Tests Ordered: Current  medicines are reviewed at length with the patient today.  Concerns regarding medicines are outlined above.  Orders Placed This Encounter  Procedures   ECHOCARDIOGRAM COMPLETE   No orders of the defined types were placed in this encounter.   Patient Instructions  Medication Instructions:  Your physician recommends that you continue on your current medications as directed. Please refer to the Current Medication list given to you today.  *If you need a refill on your cardiac medications before your next appointment, please call your pharmacy*   Testing/Procedures: Your physician has requested that you have an echocardiogram. Echocardiography is a painless test that uses sound waves to create images of your heart. It provides your doctor with information about the size and shape of your heart and how well your heart's chambers and valves are working. This procedure takes approximately one hour. There are no restrictions for this procedure. Please do NOT wear cologne, perfume, aftershave, or lotions (deodorant is allowed). Please arrive 15 minutes prior to your appointment time.  Please note: We ask at that you not bring children with you during ultrasound (echo/ vascular) testing. Due to room size and safety concerns, children are not allowed in the ultrasound rooms during exams. Our front office staff cannot provide observation of children in our lobby area while testing is being conducted. An adult accompanying a patient to their appointment will only be allowed in the ultrasound room at the discretion of the ultrasound technician under special circumstances. We apologize for any inconvenience.   Follow-Up: At [facility], you and your health needs are our priority.  As part of our continuing mission to provide you with exceptional heart care, our providers are all part of one team.  This team includes your primary Cardiologist (physician) and Advanced Practice Providers or APPs  (Physician Assistants and Nurse Practitioners) who all work together to provide you with the care you need, when you need it.  Your next appointment:   9 month(s)  Provider:   Rosea Dory, DO         Adopting a Healthy Lifestyle.  Know what a healthy weight is for you (roughly BMI <25) and aim to maintain this   Aim for 7+ servings of fruits and vegetables daily   65-80+ fluid ounces of water or unsweet tea for healthy kidneys   Limit to max 1 drink of alcohol per day; avoid smoking/tobacco   Limit animal fats in diet for cholesterol and heart health - choose grass fed whenever available   Avoid highly processed foods, and foods high in saturated/trans fats   Aim for low stress - take time to unwind and care for your mental health   Aim for 150 min of moderate intensity exercise weekly for heart health, and weights twice weekly for bone health   Aim for 7-9 hours of sleep daily   When it comes to diets, agreement about the perfect plan isnt easy to find, even among the experts. Experts at the [facility] [employer] developed an idea known as the Healthy Eating Plate. Just imagine a plate divided into logical, healthy portions.   The emphasis is on diet quality:   Load up on vegetables and fruits - one-half of your plate: Aim for color and variety, and remember that potatoes dont count.   Go for whole grains - one-quarter  of your plate: Whole wheat, barley, wheat berries, quinoa, oats, brown rice, and foods made with them. If you want pasta, go with whole wheat pasta.   Protein power - one-quarter of your plate: Fish, chicken, beans, and nuts are all healthy, versatile protein sources. Limit red meat.   The diet, however, does go beyond the plate, offering a few other suggestions.   Use healthy plant oils, such as olive, canola, soy, corn, sunflower and peanut. Check the labels, and avoid partially hydrogenated oil, which have unhealthy trans fats.   If youre  thirsty, drink water. Coffee and tea are good in moderation, but skip sugary drinks and limit milk and dairy products to one or two daily servings.   The type of carbohydrate in the diet is more important than the amount. Some sources of carbohydrates, such as vegetables, fruits, whole grains, and beans-are healthier than others.   Finally, stay active  Signed, Brittanni Cariker, DO  10/27/2023 11:33 AM    [facility]

## 2023-10-30 ENCOUNTER — Ambulatory Visit: Admitting: Cardiology

## 2023-10-30 ENCOUNTER — Other Ambulatory Visit: Payer: Self-pay | Admitting: Physician Assistant

## 2023-11-02 ENCOUNTER — Other Ambulatory Visit (INDEPENDENT_AMBULATORY_CARE_PROVIDER_SITE_OTHER): Payer: Self-pay | Admitting: Family Medicine

## 2023-11-02 DIAGNOSIS — E559 Vitamin D deficiency, unspecified: Secondary | ICD-10-CM

## 2023-11-09 ENCOUNTER — Ambulatory Visit (INDEPENDENT_AMBULATORY_CARE_PROVIDER_SITE_OTHER): Admitting: Family Medicine

## 2023-11-09 ENCOUNTER — Other Ambulatory Visit: Payer: Self-pay | Admitting: Internal Medicine

## 2023-11-09 ENCOUNTER — Encounter (INDEPENDENT_AMBULATORY_CARE_PROVIDER_SITE_OTHER): Payer: Self-pay | Admitting: Family Medicine

## 2023-11-09 VITALS — BP 93/62 | HR 67 | Temp 98.0°F | Ht 64.5 in | Wt 215.0 lb

## 2023-11-09 DIAGNOSIS — I10 Essential (primary) hypertension: Secondary | ICD-10-CM

## 2023-11-09 DIAGNOSIS — E039 Hypothyroidism, unspecified: Secondary | ICD-10-CM

## 2023-11-09 DIAGNOSIS — E669 Obesity, unspecified: Secondary | ICD-10-CM

## 2023-11-09 DIAGNOSIS — E88819 Insulin resistance, unspecified: Secondary | ICD-10-CM | POA: Diagnosis not present

## 2023-11-09 DIAGNOSIS — F4320 Adjustment disorder, unspecified: Secondary | ICD-10-CM | POA: Diagnosis not present

## 2023-11-09 DIAGNOSIS — Z6836 Body mass index (BMI) 36.0-36.9, adult: Secondary | ICD-10-CM

## 2023-11-09 DIAGNOSIS — E559 Vitamin D deficiency, unspecified: Secondary | ICD-10-CM | POA: Diagnosis not present

## 2023-11-09 DIAGNOSIS — Z6835 Body mass index (BMI) 35.0-35.9, adult: Secondary | ICD-10-CM

## 2023-11-09 MED ORDER — VITAMIN D (ERGOCALCIFEROL) 1.25 MG (50000 UNIT) PO CAPS
50000.0000 [IU] | ORAL_CAPSULE | ORAL | 0 refills | Status: DC
Start: 2023-11-09 — End: 2023-12-09

## 2023-11-09 MED ORDER — TIRZEPATIDE 7.5 MG/0.5ML ~~LOC~~ SOAJ: 7.5000 mg | SUBCUTANEOUS | 0 refills | Status: DC

## 2023-11-09 NOTE — Progress Notes (Signed)
 [name], D.O.  ABFM, ABOM Specializing in Clinical Bariatric Medicine  Office located at: [address] [place], [place]  [address]   Assessment and Plan:   Orders Placed This Encounter  Procedures   VITAMIN D  25 Hydroxy (Vit-D Deficiency, Fractures)   Vitamin B12   Insulin , random   Hemoglobin A1c    Medications Discontinued During This Encounter  Medication Reason   tirzepatide  (MOUNJARO ) 5 MG/0.5ML Pen    Vitamin D , Ergocalciferol , (DRISDOL ) 1.25 MG (50000 UNIT) CAPS capsule      Meds ordered this encounter  Medications   tirzepatide  (MOUNJARO ) 7.5 MG/0.5ML Pen    Sig: Inject 7.5 mg into the skin once a week.    Dispense:  2 mL    Refill:  0   Vitamin D , Ergocalciferol , (DRISDOL ) 1.25 MG (50000 UNIT) CAPS capsule    Sig: Take 1 capsule (50,000 Units total) by mouth every 7 (seven) days.    Dispense:  4 capsule    Refill:  0    60 d supply;  ** OV for RF **   Do not send RF request     Will order labs today; pt will come in 3-4 business days prior to next OV to obtain them    FOR THE DISEASE OF OBESITY:  BMI 35.0-35.9,adult, current BMI 36.35 Obesity (HCC)-starting bmi 41.64 Assessment & Plan: Since last office visit on 10/12/2023 patient's muscle mass has increased by 0.6 lbs. Fat mass has decreased by 2 lbs. Total body water has decreased by 0.8 lbs.  Body fat % has decreased by 0.3%. Counseling done on how various foods will affect these numbers and how to maximize success  Total lbs lost to date: - 43 lbs Total weight loss percentage to date: -16.67 %   Recommended Dietary Goals Praise is currently in the action stage of change. As such, her goal is to continue weight management plan.  She has agreed to: continue current plan   Behavioral Intervention We discussed the following today: increasing lean protein intake to established goals and increasing water intake   Additional resources provided today: n/a  Evidence-based interventions  for health behavior change were utilized today including the discussion of self monitoring techniques, problem-solving barriers and SMART goal setting techniques.   Regarding patient's less desirable eating habits and patterns, we employed the technique of small changes.   Goal: increase water intake to 70 ounces daily   Recommended Physical Activity Goals [name] has been advised to work up to 300-450 minutes of moderate intensity aerobic activity a week and strengthening exercises 2-3 times per week for cardiovascular health, weight loss maintenance and preservation of muscle mass.   She may continue to gradually increase the amount and intensity of exercise routine   Pharmacotherapy She restarted Mounjaro  5 mg weekly after LOV. She is not experiencing any GI side effects apart from tolerable constipation. Plan: INCREASE to Mounjaro  7.5 mg weekly per patient request. Of note, she will be off her Mounjaro  for 2 weeks prior to her hip replacement. Continue PNP; encouraged adequate hydration and exercise.    ASSOCIATED CONDITIONS ADDRESSED TODAY:   Grief reaction Assessment & Plan: Her mother passed away around 29-Oct-2023 from pancreatic and liver cancer. Additionally, her 110 y.o brother passed away from lung cancer on 10/25/2023. She is overall doing okay mentally; pt is a grief counselor herself and states she has personal strategies that are helping her at this time. States her  colleagues and friends have also  been very supportive. Continue personal strategies. Encouraged her to obtain a general counselor. Continue prudent nutritional plan which can support emotional well-being.     Insulin  resistance with polyphagia and carb cravings Assessment & Plan: Lab Results  Component Value Date   HGBA1C 5.3 03/19/2023   HGBA1C 5.2 04/23/2022   HGBA1C 5.4 12/02/2021   INSULIN  8.3 09/11/2022   INSULIN  16.5 04/23/2022   INSULIN  8.0 12/02/2021    I.R currently managed with diet and lifestyle  interventions. Denies excessive hunger and cravings. She will continue working on nutrition plan to decrease simple carbohydrates, increase lean proteins and exercise to promote weight loss and prevent progression to pre-dm. Order labs today.     Essential hypertension Assessment & Plan: Last 3 blood pressure readings in our office are as follows: BP Readings from Last 3 Encounters:  11/09/23 93/62  10/21/23 124/82  10/12/23 124/74   On Metoprolol  Succinate 50 mg daily, Imdur  30 mg daily, and Lasix  20 mg daily. Blood pressure is low today. Pt is completely asx and has no concerns. She will continue all medications and her low sodium diet. Will monitor for orthostasis while losing weight.     Vitamin D  deficiency Assessment & Plan: Lab Results  Component Value Date   VD25OH 36.7 08/06/2023   VD25OH 90.6 03/19/2023   VD25OH 60.7 09/11/2022   She has been taking prescription strength Vit D q 2 weeks since early 2025. Her Vitamin D  levels are not a goal of 50 to 70. Plan: Take Ergocalciferol  50,000 units every 10 days.  Continue weight loss efforts. Order labs today.      Follow up:   Return 12/09/2023 2:40 PM.  She was informed of the importance of frequent follow up visits to maximize her success with intensive lifestyle modifications for her multiple health conditions.  [name] is aware that we will review all of her lab results at our next visit together in person.  She is aware that if anything is critical/ life threatening with the results, we will be contacting her via MyChart or by my CMA will be calling them prior to the office visit to discuss acute management.     Subjective:   Chief complaint: Obesity [name] is here to discuss her progress with her obesity treatment plan. She is on the Category 1 Plan and journaling daily intake and states she is following her eating plan approximately 90% of the time. She states she is doing weights and cardio at the gym  45-60 minutes 3 days per week.   Interval History:  [name] is here for a follow up office visit. Since last OV on 10/12/2023 , she is down 1 lb. She is eating regularly but states she could do better with her protein intake; she has difficulty finishing all her proteins. She drinks about 4-5 bottles of water daily.     Pharmacotherapy that aid with weight loss: She is currently taking Mounjaro  5 mg weekly    Review of Systems:  Pertinent positives were addressed with patient today.  Reviewed by clinician on day of visit: allergies, medications, problem list, medical history, surgical history, family history, social history, and previous encounter notes.  Weight Summary and Biometrics   Weight Lost Since Last Visit: 1lb  Weight Gained Since Last Visit: 0    Vitals Temp: 98 F (36.7 C) BP: 93/62 Pulse Rate: 67 SpO2: 96 %   Anthropometric Measurements Height: 5' 4.5 (1.638 m) Weight: 215 lb (97.5 kg) BMI (  Calculated): 36.35 Weight at Last Visit: 216lb Weight Lost Since Last Visit: 1lb Weight Gained Since Last Visit: 0 Starting Weight: 258lb Total Weight Loss (lbs): 43 lb (19.5 kg) Peak Weight: 284lb   Body Composition  Body Fat %: 49 % Fat Mass (lbs): 105.4 lbs Muscle Mass (lbs): 104.2 lbs Total Body Water (lbs): 75 lbs Visceral Fat Rating : 15   Other Clinical Data Fasting: no Labs: no Today's Visit #: 31lb Starting Date: 04/09/21    Objective:   PHYSICAL EXAM: Blood pressure 93/62, pulse 67, temperature 98 F (36.7 C), height 5' 4.5 (1.638 m), weight 215 lb (97.5 kg), SpO2 96%. Body mass index is 36.33 kg/m.  General: she is overweight, cooperative and in no acute distress. PSYCH: Has normal mood, affect and thought process.   HEENT: EOMI, sclerae are anicteric. Lungs: Normal breathing effort, no conversational dyspnea. Extremities: Moves * 4 Neurologic: A and O * 3, good insight  DIAGNOSTIC DATA REVIEWED: BMET    Component Value  Date/Time   NA 139 08/12/2023 0720   NA 141 03/19/2023 0000   K 4.1 08/12/2023 0720   CL 109 08/12/2023 0720   CO2 22 08/12/2023 0720   GLUCOSE 109 (H) 08/12/2023 0720   BUN 14 08/12/2023 0720   BUN 18 03/19/2023 0000   CREATININE 1.17 (H) 08/12/2023 0720   CREATININE 1.17 (H) 02/22/2021 1501   CALCIUM  7.7 (L) 08/12/2023 0720   GFRNONAA 51 (L) 08/12/2023 0720   GFRAA 52 (L) 08/04/2018 0921   Lab Results  Component Value Date   HGBA1C 5.3 03/19/2023   HGBA1C 5.6 01/31/2017   Lab Results  Component Value Date   INSULIN  8.3 09/11/2022   INSULIN  17.2 04/09/2021   Lab Results  Component Value Date   TSH 1.50 03/19/2023   CBC    Component Value Date/Time   WBC 6.0 08/12/2023 0720   RBC 3.28 (L) 08/12/2023 0720   HGB 11.0 (L) 08/12/2023 0720   HCT 32.3 (L) 08/12/2023 0720   PLT 134 (L) 08/12/2023 0720   MCV 98.5 08/12/2023 0720   MCH 33.5 08/12/2023 0720   MCHC 34.1 08/12/2023 0720   RDW 16.4 (H) 08/12/2023 0720   Iron Studies    Component Value Date/Time   IRON 386 06/23/2017 0000   TIBC 285 06/23/2017 0000   FERRITIN 58 06/23/2017 0000   IRONPCTSAT 26 06/23/2017 0000   Lipid Panel     Component Value Date/Time   CHOL 130 03/19/2023 0000   CHOL 134 04/23/2022 1456   TRIG 47 03/19/2023 0000   HDL 49 03/19/2023 0000   HDL 55 04/23/2022 1456   CHOLHDL 3 03/02/2023 1552   VLDL 12.0 03/02/2023 1552   LDLCALC 64 03/19/2023 0000   LDLCALC 67 04/23/2022 1456   LDLCALC 68 11/14/2019 1537   LDLDIRECT 321.8 02/27/2012 1202   Hepatic Function Panel     Component Value Date/Time   PROT 7.2 03/02/2023 1552   PROT 7.2 04/23/2022 1456   ALBUMIN  4.3 03/19/2023 0000   ALBUMIN  4.2 04/23/2022 1456   AST 31 03/19/2023 0000   ALT 22 03/19/2023 0000   ALKPHOS 101 03/19/2023 0000   BILITOT 0.6 03/02/2023 1552   BILITOT 0.4 04/23/2022 1456   BILIDIR 0.2 03/19/2023 0000   BILIDIR 0.10 11/03/2016 1155   IBILI 0.2 11/14/2019 1537      Component Value Date/Time   TSH  1.50 03/19/2023 0000   TSH 1.06 03/02/2023 1552   Nutritional Lab Results  Component Value Date  VD25OH 36.7 08/06/2023   VD25OH 90.6 03/19/2023   VD25OH 60.7 09/11/2022    Attestations:   I, Special Puri, acting as a Stage manager for [employer], DO., have compiled all relevant documentation for today's office visit on behalf of Meghan Jenkins, DO, while in the presence of [employer], DO.  I have spent 45 minutes in the care of the patient today including 35 minutes face-to-face assessing and reviewing listed medical problems above as outlined in office visit note and providing nutritional and behavioral counseling as outlined in obesity care plan.   I have reviewed the above documentation for accuracy and completeness, and I agree with the above. Meghan JINNY Adams, D.O.  The 21st Century Cures Act was signed into law in 2016 which includes the topic of electronic health records.  This provides immediate access to information in MyChart.  This includes consultation notes, operative notes, office notes, lab results and pathology reports.  If you have any questions about what you read please let us  know at your next visit so we can discuss your concerns and take corrective action if need be.  We are right here with you.

## 2023-11-10 ENCOUNTER — Other Ambulatory Visit (INDEPENDENT_AMBULATORY_CARE_PROVIDER_SITE_OTHER): Payer: Self-pay | Admitting: Family Medicine

## 2023-11-11 LAB — HEMOGLOBIN A1C
Est. average glucose Bld gHb Est-mCnc: 103 mg/dL
Hgb A1c MFr Bld: 5.2 % (ref 4.8–5.6)

## 2023-11-11 LAB — INSULIN, RANDOM: INSULIN: 11.5 u[IU]/mL (ref 2.6–24.9)

## 2023-11-11 LAB — VITAMIN B12: Vitamin B-12: 1803 pg/mL — ABNORMAL HIGH (ref 232–1245)

## 2023-11-11 LAB — VITAMIN D 25 HYDROXY (VIT D DEFICIENCY, FRACTURES): Vit D, 25-Hydroxy: 49.2 ng/mL (ref 30.0–100.0)

## 2023-11-17 ENCOUNTER — Other Ambulatory Visit: Payer: Self-pay | Admitting: Physician Assistant

## 2023-11-17 DIAGNOSIS — Z01818 Encounter for other preprocedural examination: Secondary | ICD-10-CM

## 2023-11-19 ENCOUNTER — Telehealth: Payer: Self-pay

## 2023-11-19 NOTE — Telephone Encounter (Signed)
 Copied from CRM [id number]. Topic: Appointments - Scheduling Inquiry for Clinic >> Nov 19, 2023  1:58 PM Robinson H wrote: Reason for CRM: Patient needs to have an appointment before getting her medication refilled, patient is having left hip replacement surgery on 9/8, provider next available appointment isn't until 9/16 and patient not sure if she will be able to come in and wants to know can she get in before the 8th only has 2 pills of the levothyroxine  (SYNTHROID ) 75 MCG tablet left.  [name] [phone]

## 2023-11-20 NOTE — Telephone Encounter (Signed)
 Copied from CRM [id number]. Topic: Clinical - Medication Question >> Nov 20, 2023  4:22 PM Lauren C wrote: Reason for CRM: Pt says she was supposed to receive a call back from a nurse in regard to synthroid  prescription. She also says she needs a prescription for a COVID vaccine sent to the same pharmacy, [facility] [address].

## 2023-11-24 ENCOUNTER — Other Ambulatory Visit: Payer: Self-pay | Admitting: Internal Medicine

## 2023-11-24 DIAGNOSIS — E039 Hypothyroidism, unspecified: Secondary | ICD-10-CM

## 2023-11-24 MED ORDER — LEVOTHYROXINE SODIUM 75 MCG PO TABS: 75.0000 ug | ORAL_TABLET | Freq: Every day | ORAL | 0 refills | Status: DC

## 2023-11-24 NOTE — Progress Notes (Incomplete)
 PCP - Dr Debby Molt Cardiologist -  Dr Arlyce Huntsman  CT Chest x-ray - 07/21/23 EKG - 02/05/23 Stress Test - 01/11/14 ECHO - 03/11/22 Cardiac Cath - 02/26/21  ICD Pacemaker/Loop - n/a  Sleep Study -  Yes, mild CPAP - does not use CPAP  Pre-diabetes.   Hold Mounjaro  (weight loss) for 7 days prior to procedure.GLP1 instructions.  Last dose of Mounjaro  was on 11/03/23.  Hold Farxiga  for 72 hours prior to procedure.  Last dose will be on Friday, 11/27/23.  Aspirin  and Plaquenil  Instructions:  Patient agreed to call MD for instructions on the above meds for her upcoming surgery 12/01/23.  ERAS -clear liquids til 4:30 AM DOS.  Ensure PRE-SURGERY drink was given with instructions for DOS.   Anesthesia review: Yes   STOP now taking any Aspirin  (unless otherwise instructed by your surgeon), Aleve, Naproxen, Ibuprofen, Motrin, Advil, Goody's, BC's, all herbal medications, fish oil, and all vitamins.   Coronavirus Screening Do you have any of the following symptoms:  Cough yes/no: No Fever (>100.61F)  yes/no: No Runny nose yes/no: No Sore throat yes/no: No Difficulty breathing/shortness of breath  yes/no: No  Have you traveled in the last 14 days and where? yes/no: No  Patient verbalized understanding of instructions that were given to them at the PAT appointment. Patient was also instructed that they will need to review over the PAT instructions again at home before surgery.

## 2023-11-24 NOTE — Telephone Encounter (Signed)
 Copied from CRM #[id number]. Topic: Clinical - Medication Refill >> Nov 24, 2023 10:30 AM Macario HERO wrote: Medication: levothyroxine  (SYNTHROID ) 75 MCG tablet [[id number]]  Has the patient contacted their pharmacy? Yes (Agent: If no, request that the patient contact the pharmacy for the refill. If patient does not wish to contact the pharmacy document the reason why and proceed with request.) (Agent: If yes, when and what did the pharmacy advise?)  This is the patient's preferred pharmacy:  [id number] [place], [place] - [address] [place] [address] Phone: [phone] Fax: [phone]   Is this the correct pharmacy for this prescription? Yes If no, delete pharmacy and type the correct one.   Has the prescription been filled recently? No  Is the patient out of the medication? Yes  Has the patient been seen for an appointment in the last year OR does the patient have an upcoming appointment? Yes  Can we respond through MyChart? Yes  Agent: Please be advised that Rx refills may take up to 3 business days. We ask that you follow-up with your pharmacy.

## 2023-11-24 NOTE — Pre-Procedure Instructions (Signed)
 Surgical Instructions    Your procedure is scheduled on Tuesday, September 9th.    Report to [facility] Main Entrance A at 0530 A.M., then check in with the Admitting office.  Call this number if you have problems the morning of surgery:  [phone]  If you have any questions prior to your surgery date call [phone]: Open Monday-Friday 8am-4pm If you experience any cold or flu symptoms such as cough, fever, chills, shortness of breath, etc. between now and your scheduled surgery, please notify us  at the above number.     Remember:  Do not eat after midnight the night before your surgery  You may drink clear liquids until 0430 the morning of your surgery.   Clear liquids allowed are: Water, Non-Citrus Juices (without pulp), Carbonated Beverages, Clear Tea, Black Coffee Only (NO MILK, CREAM OR POWDERED CREAMER of any kind), and Gatorade.   The day of surgery (if you have diabetes): Drink ONE (1) 12 oz G2 given to you in your pre admission testing appointment by 0430 the morning of surgery. Drink in one sitting. Do not sip.  This drink was given to you during your hospital  pre-op appointment visit.  Nothing else to drink after completing the  12 oz bottle of G2.         If you have questions, please contact your surgeon's office.     Take these medicines the morning of surgery with A SIP OF WATER:             azaTHIOprine  (IMURAN )              fluticasone  (FLONASE )              isosorbide  mononitrate (IMDUR )              levothyroxine  (SYNTHROID )             metoprolol  succinate (TOPROL -XL)              pantoprazole  (PROTONIX )              rosuvastatin  (CRESTOR )               As needed:           albuterol  (VENTOLIN  HFA)            cyclobenzaprine  (FLEXERIL )            HYDROcodone -acetaminophen  (NORCO/VICODIN)            nitroGLYCERIN  (NITROSTAT )            oxycodone  (OXY-IR)            pregabalin  (LYRICA )   Please follow your surgeon's instructions on when to  stop aspirin . If no instructions are given- please call their office.   Please call your provider who prescribed you hydroxychloroquine  (PLAQUENIL ) to get instructions on when to stop it.   WHAT DO I DO ABOUT MY DIABETES MEDICATION?  Please hold dapagliflozin  propanediol (FARXIGA ) for 72 hours prior to procedure. Last dose - September 5th.   Please hold tirzepatide  (MOUNJARO ) for 7 days prior to your procedure. The last day should be no later than September 1st.    HOW TO MANAGE YOUR DIABETES BEFORE AND AFTER SURGERY  Why is it important to control my blood sugar before and after surgery? Improving blood sugar levels before and after surgery helps healing and can limit problems. A way of improving blood sugar control is eating a healthy diet by:  Eating less sugar and carbohydrates  Increasing activity/exercise  Talking with your doctor about reaching your blood sugar goals High blood sugars (greater than 180 mg/dL) can raise your risk of infections and slow your recovery, so you will need to focus on controlling your diabetes during the weeks before surgery. Make sure that the doctor who takes care of your diabetes knows about your planned surgery including the date and location.  How do I manage my blood sugar before surgery? Check your blood sugar at least 4 times a day, starting 2 days before surgery, to make sure that the level is not too high or low.  Check your blood sugar the morning of your surgery when you wake up and every 2 hours until you get to the Short Stay unit.  If your blood sugar is less than 70 mg/dL, you will need to treat for low blood sugar: Do not take insulin . Treat a low blood sugar (less than 70 mg/dL) with  cup of clear juice (cranberry or apple), 4 glucose tablets, OR glucose gel. Recheck blood sugar in 15 minutes after treatment (to make sure it is greater than 70 mg/dL). If your blood sugar is not greater than 70 mg/dL on recheck, call [phone] for  further instructions. Report your blood sugar to the short stay nurse when you get to Short Stay.  If you are admitted to the hospital after surgery: Your blood sugar will be checked by the staff and you will probably be given insulin  after surgery (instead of oral diabetes medicines) to make sure you have good blood sugar levels. The goal for blood sugar control after surgery is 80-180 mg/dL.   As of today, STOP taking any Aspirin  (unless otherwise instructed by your surgeon) Aleve, Naproxen, Ibuprofen, Motrin, Advil, Goody's, BC's, all herbal medications, fish oil, and all vitamins.                     Do NOT Smoke (Tobacco/Vaping) for 24 hours prior to your procedure.  If you use a CPAP at night, you may bring your mask/headgear for your overnight stay.   Contacts, glasses, piercing's, hearing aid's, dentures or partials may not be worn into surgery, please bring cases for these belongings.    For patients admitted to the hospital, discharge time will be determined by your treatment team.   Patients discharged the day of surgery will not be allowed to drive home, and someone needs to stay with them for 24 hours.  SURGICAL WAITING ROOM VISITATION Patients having surgery or a procedure may have no more than 2 support people in the waiting area - these visitors may rotate.   Children under the age of 71 must have an adult with them who is not the patient. If the patient needs to stay at the hospital during part of their recovery, the visitor guidelines for inpatient rooms apply. Pre-op nurse will coordinate an appropriate time for 1 support person to accompany patient in pre-op.  This support person may not rotate.   Please refer to the [facility] website for the visitor guidelines for Inpatients (after your surgery is over and you are in a regular room).    Special instructions:     Pre-operative 5 CHG Bath Instructions   You can play a key role in reducing the risk of infection  after surgery. Your skin needs to be as free of germs as possible. You can reduce the number of germs on your skin by washing with CHG (chlorhexidine  gluconate) soap before surgery.  CHG is an antiseptic soap that kills germs and continues to kill germs even after washing.   DO NOT use if you have an allergy to chlorhexidine /CHG or antibacterial soaps. If your skin becomes reddened or irritated, stop using the CHG and notify one of our RNs at [phone].   Please shower with the CHG soap starting 4 days before surgery using the following schedule:     Please keep in mind the following:  DO NOT shave, including legs and underarms, starting the day of your first shower.   You may shave your face at any point before/day of surgery.  Place clean sheets on your bed the day you start using CHG soap. Use a clean washcloth (not used since being washed) for each shower. DO NOT sleep with pets once you start using the CHG.   CHG Shower Instructions:  If you choose to wash your hair and private area, wash first with your normal shampoo/soap.  After you use shampoo/soap, rinse your hair and body thoroughly to remove shampoo/soap residue.  Turn the water OFF and apply about 3 tablespoons (45 ml) of CHG soap to a CLEAN washcloth.  Apply CHG soap ONLY FROM YOUR NECK DOWN TO YOUR TOES (washing for 3-5 minutes)  DO NOT use CHG soap on face, private areas, open wounds, or sores.  Pay special attention to the area where your surgery is being performed.  If you are having back surgery, having someone wash your back for you may be helpful. Wait 2 minutes after CHG soap is applied, then you may rinse off the CHG soap.  Pat dry with a clean towel  Put on clean clothes/pajamas   If you choose to wear lotion, please use ONLY the CHG-compatible lotions on the back of this paper.     Additional instructions for the day of surgery: DO NOT APPLY any lotions, deodorants, cologne, or perfumes.   Put on  clean/comfortable clothes.  Brush your teeth.  Ask your nurse before applying any prescription medications to the skin.      CHG Compatible Lotions   Aveeno Moisturizing lotion  Cetaphil Moisturizing Cream  Cetaphil Moisturizing Lotion  Clairol Herbal Essence Moisturizing Lotion, Dry Skin  Clairol Herbal Essence Moisturizing Lotion, Extra Dry Skin  Clairol Herbal Essence Moisturizing Lotion, Normal Skin  Curel Age Defying Therapeutic Moisturizing Lotion with Alpha Hydroxy  Curel Extreme Care Body Lotion  Curel Soothing Hands Moisturizing Hand Lotion  Curel Therapeutic Moisturizing Cream, Fragrance-Free  Curel Therapeutic Moisturizing Lotion, Fragrance-Free  Curel Therapeutic Moisturizing Lotion, Original Formula  Eucerin Daily Replenishing Lotion  Eucerin Dry Skin Therapy Plus Alpha Hydroxy Crme  Eucerin Dry Skin Therapy Plus Alpha Hydroxy Lotion  Eucerin Original Crme  Eucerin Original Lotion  Eucerin Plus Crme Eucerin Plus Lotion  Eucerin TriLipid Replenishing Lotion  Keri Anti-Bacterial Hand Lotion  Keri Deep Conditioning Original Lotion Dry Skin Formula Softly Scented  Keri Deep Conditioning Original Lotion, Fragrance Free Sensitive Skin Formula  Keri Lotion Fast Absorbing Fragrance Free Sensitive Skin Formula  Keri Lotion Fast Absorbing Softly Scented Dry Skin Formula  Keri Original Lotion  Keri Skin Renewal Lotion Keri Silky Smooth Lotion  Keri Silky Smooth Sensitive Skin Lotion  Nivea Body Creamy Conditioning Oil  Nivea Body Extra Enriched Teacher, adult education Moisturizing Lotion Nivea Crme  Nivea Skin Firming Lotion  NutraDerm 30 Skin Lotion  NutraDerm Skin Lotion  NutraDerm Therapeutic Skin Cream  NutraDerm Therapeutic Skin Lotion  ProShield Protective Hand  Cream  Provon moisturizing lotion   Please read over the following fact sheets that you were given.    If you received a COVID test during your pre-op visit  it is  requested that you wear a mask when out in public, stay away from anyone that may not be feeling well and notify your surgeon if you develop symptoms. If you have been in contact with anyone that has tested positive in the last 10 days please notify you surgeon.

## 2023-11-25 ENCOUNTER — Other Ambulatory Visit: Payer: Self-pay

## 2023-11-25 ENCOUNTER — Encounter (HOSPITAL_COMMUNITY)
Admission: RE | Admit: 2023-11-25 | Discharge: 2023-11-25 | Disposition: A | Discharge status: Home / Self Care | Source: Ambulatory Visit | Attending: Orthopaedic Surgery | Admitting: Orthopaedic Surgery

## 2023-11-25 ENCOUNTER — Encounter (HOSPITAL_COMMUNITY): Payer: Self-pay

## 2023-11-25 VITALS — BP 95/71 | HR 62 | Temp 98.4°F | Resp 18 | Ht 66.0 in | Wt 212.9 lb

## 2023-11-25 DIAGNOSIS — Z79899 Other long term (current) drug therapy: Secondary | ICD-10-CM | POA: Diagnosis not present

## 2023-11-25 DIAGNOSIS — I081 Rheumatic disorders of both mitral and tricuspid valves: Secondary | ICD-10-CM | POA: Insufficient documentation

## 2023-11-25 DIAGNOSIS — Z01812 Encounter for preprocedural laboratory examination: Secondary | ICD-10-CM | POA: Diagnosis not present

## 2023-11-25 DIAGNOSIS — Z955 Presence of coronary angioplasty implant and graft: Secondary | ICD-10-CM | POA: Insufficient documentation

## 2023-11-25 DIAGNOSIS — Z87891 Personal history of nicotine dependence: Secondary | ICD-10-CM | POA: Diagnosis not present

## 2023-11-25 DIAGNOSIS — Z951 Presence of aortocoronary bypass graft: Secondary | ICD-10-CM | POA: Diagnosis not present

## 2023-11-25 DIAGNOSIS — G4733 Obstructive sleep apnea (adult) (pediatric): Secondary | ICD-10-CM | POA: Diagnosis not present

## 2023-11-25 DIAGNOSIS — Z9889 Other specified postprocedural states: Secondary | ICD-10-CM | POA: Diagnosis not present

## 2023-11-25 DIAGNOSIS — N183 Chronic kidney disease, stage 3 unspecified: Secondary | ICD-10-CM | POA: Insufficient documentation

## 2023-11-25 DIAGNOSIS — I739 Peripheral vascular disease, unspecified: Secondary | ICD-10-CM | POA: Insufficient documentation

## 2023-11-25 DIAGNOSIS — I2510 Atherosclerotic heart disease of native coronary artery without angina pectoris: Secondary | ICD-10-CM | POA: Diagnosis not present

## 2023-11-25 DIAGNOSIS — I7121 Aneurysm of the ascending aorta, without rupture: Secondary | ICD-10-CM | POA: Insufficient documentation

## 2023-11-25 DIAGNOSIS — K219 Gastro-esophageal reflux disease without esophagitis: Secondary | ICD-10-CM | POA: Diagnosis not present

## 2023-11-25 DIAGNOSIS — Z01818 Encounter for other preprocedural examination: Secondary | ICD-10-CM

## 2023-11-25 LAB — TYPE AND SCREEN
ABO/RH(D): O POS
Antibody Screen: NEGATIVE

## 2023-11-25 LAB — CBC
HCT: 43.5 % (ref 36.0–46.0)
Hemoglobin: 14.5 g/dL (ref 12.0–15.0)
MCH: 32.8 pg (ref 26.0–34.0)
MCHC: 33.3 g/dL (ref 30.0–36.0)
MCV: 98.4 fL (ref 80.0–100.0)
Platelets: 235 10*3/uL (ref 150–400)
RBC: 4.42 MIL/uL (ref 3.87–5.11)
RDW: 16.8 % — ABNORMAL HIGH (ref 11.5–15.5)
WBC: 2.9 10*3/uL — ABNORMAL LOW (ref 4.0–10.5)
nRBC: 0 % (ref 0.0–0.2)

## 2023-11-25 LAB — BASIC METABOLIC PANEL WITH GFR
Anion gap: 9 (ref 5–15)
BUN: 19 mg/dL (ref 8–23)
CO2: 26 mmol/L (ref 22–32)
Calcium: 9.2 mg/dL (ref 8.9–10.3)
Chloride: 102 mmol/L (ref 98–111)
Creatinine, Ser: 1.18 mg/dL — ABNORMAL HIGH (ref 0.44–1.00)
GFR, Estimated: 51 mL/min — ABNORMAL LOW
Glucose, Bld: 88 mg/dL (ref 70–99)
Potassium: 4 mmol/L (ref 3.5–5.1)
Sodium: 137 mmol/L (ref 135–145)

## 2023-11-25 LAB — SURGICAL PCR SCREEN
MRSA, PCR: NEGATIVE
Staphylococcus aureus: NEGATIVE

## 2023-11-25 NOTE — Pre-Procedure Instructions (Addendum)
 Surgical Instructions    Your procedure is scheduled on Tuesday, September 9th.    Report to [facility] Main Entrance A at 0530 A.M., then check in with the Admitting office.  Call this number if you have problems the morning of surgery:  [phone]  If you have any questions prior to your surgery date call [phone]: Open Monday-Friday 8am-4pm If you experience any cold or flu symptoms such as cough, fever, chills, shortness of breath, etc. between now and your scheduled surgery, please notify us  at the above number.     Remember:  Do not eat after midnight the night before your surgery  You may drink clear liquids until 0430 the morning of your surgery.   Clear liquids allowed are: Water, Non-Citrus Juices (without pulp), Carbonated Beverages, Clear Tea, Black Coffee Only (NO MILK, CREAM OR POWDERED CREAMER of any kind), and Gatorade.   The day of surgery Drink ONE (1) 12 oz Ensure drink given to you in your pre admission testing appointment by 0430 the morning of surgery. Drink in one sitting. Do not sip.  This drink was given to you during your hospital  pre-op appointment visit.  Nothing else to drink after completing the  12 oz bottle of G2.         If you have questions, please contact your surgeon's office.     Take these medicines the morning of surgery with A SIP OF WATER:             azaTHIOprine  (IMURAN )              fluticasone  (FLONASE )              isosorbide  mononitrate (IMDUR )              levothyroxine  (SYNTHROID )             metoprolol  succinate (TOPROL -XL)              pantoprazole  (PROTONIX )              rosuvastatin  (CRESTOR )               As needed:           albuterol  (VENTOLIN  HFA)            cyclobenzaprine  (FLEXERIL )            HYDROcodone -acetaminophen  (NORCO/VICODIN)            nitroGLYCERIN  (NITROSTAT )            oxycodone  (OXY-IR)            pregabalin  (LYRICA )   Please follow your surgeon's instructions on when to stop aspirin . If  no instructions are given- please call their office.   Please call your provider who prescribed you hydroxychloroquine  (PLAQUENIL ) to get instructions on when to stop it.    Please hold dapagliflozin  propanediol (FARXIGA ) for 72 hours prior to procedure. Last dose was on 11/27/23.    Please hold tirzepatide  (MOUNJARO ) for 7 days prior to your procedure. The last dose was on 11/03/23.     As of today, STOP taking any Aspirin  (unless otherwise instructed by your surgeon) Aleve, Naproxen, Ibuprofen, Motrin, Advil, Goody's, BC's, all herbal medications, fish oil, and all vitamins.            Contacts, glasses, piercing's, hearing aid's, dentures or partials may not be worn into surgery, please bring cases for these belongings.    For patients admitted to the hospital, discharge time  will be determined by your treatment team.    SURGICAL WAITING ROOM VISITATION Patients having surgery or a procedure may have no more than 2 support people in the waiting area - these visitors may rotate.   Children under the age of 55 must have an adult with them who is not the patient. If the patient needs to stay at the hospital during part of their recovery, the visitor guidelines for inpatient rooms apply. Pre-op nurse will coordinate an appropriate time for 1 support person to accompany patient in pre-op.  This support person may not rotate.   Please refer to the [facility] website for the visitor guidelines for Inpatients (after your surgery is over and you are in a regular room).    Special instructions:     Pre-operative 5 CHG Bath Instructions   You can play a key role in reducing the risk of infection after surgery. Your skin needs to be as free of germs as possible. You can reduce the number of germs on your skin by washing with CHG (chlorhexidine  gluconate) soap before surgery. CHG is an antiseptic soap that kills germs and continues to kill germs even after washing.   DO NOT use if you have an  allergy to chlorhexidine /CHG or antibacterial soaps. If your skin becomes reddened or irritated, stop using the CHG and notify one of our RNs at [phone].   Please shower with the CHG soap starting 4 days before surgery using the following schedule:     Please keep in mind the following:  DO NOT shave, including legs and underarms, starting the day of your first shower.   You may shave your face at any point before/day of surgery.  Place clean sheets on your bed the day you start using CHG soap. Use a clean washcloth (not used since being washed) for each shower. DO NOT sleep with pets once you start using the CHG.   CHG Shower Instructions:  If you choose to wash your hair and private area, wash first with your normal shampoo/soap.  After you use shampoo/soap, rinse your hair and body thoroughly to remove shampoo/soap residue.  Turn the water OFF and apply about 3 tablespoons (45 ml) of CHG soap to a CLEAN washcloth.  Apply CHG soap ONLY FROM YOUR NECK DOWN TO YOUR TOES (washing for 3-5 minutes)  DO NOT use CHG soap on face, private areas, open wounds, or sores.  Pay special attention to the area where your surgery is being performed.  If you are having back surgery, having someone wash your back for you may be helpful. Wait 2 minutes after CHG soap is applied, then you may rinse off the CHG soap.  Pat dry with a clean towel  Put on clean clothes/pajamas   If you choose to wear lotion, please use ONLY the CHG-compatible lotions on the back of this paper.     Additional instructions for the day of surgery: DO NOT APPLY any lotions, deodorants, cologne, or perfumes.   Put on clean/comfortable clothes.  Brush your teeth.  Ask your nurse before applying any prescription medications to the skin.      CHG Compatible Lotions   Aveeno Moisturizing lotion  Cetaphil Moisturizing Cream  Cetaphil Moisturizing Lotion  Clairol Herbal Essence Moisturizing Lotion, Dry Skin  Clairol  Herbal Essence Moisturizing Lotion, Extra Dry Skin  Clairol Herbal Essence Moisturizing Lotion, Normal Skin  Curel Age Defying Therapeutic Moisturizing Lotion with Alpha Hydroxy  Curel Extreme Care Body Lotion  Curel  Soothing Hands Moisturizing Hand Lotion  Curel Therapeutic Moisturizing Cream, Fragrance-Free  Curel Therapeutic Moisturizing Lotion, Fragrance-Free  Curel Therapeutic Moisturizing Lotion, Original Formula  Eucerin Daily Replenishing Lotion  Eucerin Dry Skin Therapy Plus Alpha Hydroxy Crme  Eucerin Dry Skin Therapy Plus Alpha Hydroxy Lotion  Eucerin Original Crme  Eucerin Original Lotion  Eucerin Plus Crme Eucerin Plus Lotion  Eucerin TriLipid Replenishing Lotion  Keri Anti-Bacterial Hand Lotion  Keri Deep Conditioning Original Lotion Dry Skin Formula Softly Scented  Keri Deep Conditioning Original Lotion, Fragrance Free Sensitive Skin Formula  Keri Lotion Fast Absorbing Fragrance Free Sensitive Skin Formula  Keri Lotion Fast Absorbing Softly Scented Dry Skin Formula  Keri Original Lotion  Keri Skin Renewal Lotion Keri Silky Smooth Lotion  Keri Silky Smooth Sensitive Skin Lotion  Nivea Body Creamy Conditioning Oil  Nivea Body Extra Enriched Lotion  Nivea Body Original Lotion  Nivea Body Sheer Moisturizing Lotion Nivea Crme  Nivea Skin Firming Lotion  NutraDerm 30 Skin Lotion  NutraDerm Skin Lotion  NutraDerm Therapeutic Skin Cream  NutraDerm Therapeutic Skin Lotion  ProShield Protective Hand Cream  Provon moisturizing lotion   Please read over the following fact sheets that you were given.    If you received a COVID test during your pre-op visit  it is requested that you wear a mask when out in public, stay away from anyone that may not be feeling well and notify your surgeon if you develop symptoms. If you have been in contact with anyone that has tested positive in the last 10 days please notify you surgeon.

## 2023-11-26 ENCOUNTER — Ambulatory Visit (HOSPITAL_COMMUNITY)
Admission: RE | Admit: 2023-11-26 | Discharge: 2023-11-26 | Disposition: A | Discharge status: Home / Self Care | Source: Ambulatory Visit | Attending: Cardiology | Admitting: Cardiology

## 2023-11-26 DIAGNOSIS — I082 Rheumatic disorders of both aortic and tricuspid valves: Secondary | ICD-10-CM | POA: Insufficient documentation

## 2023-11-26 DIAGNOSIS — Z9889 Other specified postprocedural states: Secondary | ICD-10-CM | POA: Diagnosis present

## 2023-11-26 DIAGNOSIS — Z48812 Encounter for surgical aftercare following surgery on the circulatory system: Secondary | ICD-10-CM | POA: Diagnosis not present

## 2023-11-26 DIAGNOSIS — R079 Chest pain, unspecified: Secondary | ICD-10-CM | POA: Insufficient documentation

## 2023-11-26 DIAGNOSIS — Z87891 Personal history of nicotine dependence: Secondary | ICD-10-CM | POA: Diagnosis not present

## 2023-11-26 DIAGNOSIS — I252 Old myocardial infarction: Secondary | ICD-10-CM | POA: Insufficient documentation

## 2023-11-26 DIAGNOSIS — I7121 Aneurysm of the ascending aorta, without rupture: Secondary | ICD-10-CM | POA: Insufficient documentation

## 2023-11-26 DIAGNOSIS — I739 Peripheral vascular disease, unspecified: Secondary | ICD-10-CM | POA: Diagnosis not present

## 2023-11-26 DIAGNOSIS — M329 Systemic lupus erythematosus, unspecified: Secondary | ICD-10-CM | POA: Diagnosis not present

## 2023-11-26 DIAGNOSIS — Z952 Presence of prosthetic heart valve: Secondary | ICD-10-CM | POA: Insufficient documentation

## 2023-11-26 DIAGNOSIS — R7303 Prediabetes: Secondary | ICD-10-CM | POA: Diagnosis not present

## 2023-11-26 DIAGNOSIS — T8203XA Leakage of heart valve prosthesis, initial encounter: Secondary | ICD-10-CM | POA: Diagnosis not present

## 2023-11-26 DIAGNOSIS — Z951 Presence of aortocoronary bypass graft: Secondary | ICD-10-CM | POA: Insufficient documentation

## 2023-11-26 DIAGNOSIS — E785 Hyperlipidemia, unspecified: Secondary | ICD-10-CM | POA: Diagnosis not present

## 2023-11-26 DIAGNOSIS — I2510 Atherosclerotic heart disease of native coronary artery without angina pectoris: Secondary | ICD-10-CM | POA: Insufficient documentation

## 2023-11-26 DIAGNOSIS — I10 Essential (primary) hypertension: Secondary | ICD-10-CM | POA: Diagnosis not present

## 2023-11-26 LAB — ECHOCARDIOGRAM COMPLETE
Area-P 1/2: 1.6 cm2
MV VTI: 1.22 cm2
S' Lateral: 4.19 cm

## 2023-11-26 NOTE — Progress Notes (Signed)
 Anesthesia Chart Review:  68 year old female follows with cardiology for history of lupus erythematous (in remission), coronary artery disease with prior history of stenting and ultimately four-vessel bypass in 2018, mitral valve annuloplasty at the time of her CABG in 2018, postoperative atrial fibrillation, peripheral artery disease and ascending aortic aneurysm.  Cath 02/2019 showed CTO of proximal RCA with collaterals from the LAD and left circumflex, patent LIMA to LAD, patent SVG to the 2 large obtuse marginal branches, no intervention at that time, antianginal therapy recommended.  Echo 02/2022 showed EF 50 to 55%, normal RV function, left atrium severely dilated, mild mitral regurgitation.  Last seen by Dr. Sheena on 10/21/23 and noted to be doing well.  Updated echo was ordered to reassess previously repaired mitral valve.  Upcoming surgery was also discussed.  Per note, She is planned for surgery on September 9: The patient does not have any unstable cardiac conditions.  Upon evaluation today, she can achieve 4 METs or greater without anginal symptoms.  According to [facility] and AHA guidelines, she requires no further cardiac workup prior to her noncardiac surgery and should be at acceptable risk.  Our service is available as necessary in the perioperative period.  Echo 11/26/2023 showed LVEF 50%, normal RV, moderate tricuspid regurgitation, mild to moderate mitral regurgitation.  Other pertinent history includes former smoker (30 pack years, quit 2017), PONV, GERD on PPI, hypothyroid, OSA not on CPAP, CKD 3, immune thrombocytopenic purpura s/p splenectomy.   Patient reports last dose of Mounjaro  11/03/2023.  Preop labs reviewed, creatinine mildly elevated at 1.18, mild leukocytopenia with WBC 2.9, otherwise unremarkable.   EKG 02/05/2023: Normal sinus rhythm.  Rate 65. Low voltage QRS   TTE 11/26/2023: 1. Left ventricular ejection fraction, by estimation, is 45 to 50%. Left  ventricular ejection  fraction by 3D volume is 50 %. The left ventricle has  mildly decreased function. The left ventricle has no regional wall motion  abnormalities. Left ventricular   diastolic function could not be evaluated. The average left ventricular  global longitudinal strain is -15.6 %. The global longitudinal strain is  abnormal.   2. Right ventricular systolic function is normal. The right ventricular  size is normal.   3. Left atrial size was moderately dilated.   4. Tricuspid valve regurgitation is moderate.   5. The aortic valve is normal in structure. Aortic valve regurgitation is  mild. PHT .   6. The mitral valve has been repaired/replaced. Mild to moderate mitral  valve regurgitation. The mean mitral valve gradient is 5.0 mmHg. There is  a prosthetic annuloplasty ring present in the mitral position.    Cath 02/27/2019: Total occlusion of the proximal native RCA which receives collaterals from the LAD and left circumflex territory. Patent left main Focal 90% stenosis in the proximal LAD.  LAD fills competitively because of patent LIMA to the mid vessel. Diffuse disease in the proximal and mid circumflex with patent saphenous vein graft to the 2 large obtuse marginal branches. Widely patent LIMA to LAD. Widely patent sequential saphenous vein graft to the first and second obtuse marginal branches. Total occlusion at aorto-ostial junction, appearing chronic.   Lynwood Geofm RIGGERS [facility] Short Stay Center/Anesthesiology Phone [phone] 11/27/2023 7:54 AM

## 2023-11-26 NOTE — Telephone Encounter (Signed)
 Patient has been made aware that her medication has been sent to her local pharmacy and she did get her COVID vaccine.

## 2023-11-27 NOTE — Anesthesia Preprocedure Evaluation (Addendum)
 Anesthesia Evaluation  Patient identified by MRN, date of birth, ID band Patient awake    Reviewed: Allergy & Precautions, NPO status , Patient's Chart, lab work & pertinent test results  History of Anesthesia Complications (+) PONV and history of anesthetic complications  Airway Mallampati: II  TM Distance: >3 FB     Dental  (+) Edentulous Upper, Edentulous Lower   Pulmonary sleep apnea , pneumonia, resolved, former smoker   Pulmonary exam normal breath sounds clear to auscultation       Cardiovascular hypertension, Pt. on medications + angina with exertion + CAD, + Past MI and + Cardiac Stents  Normal cardiovascular exam+ dysrhythmias Atrial Fibrillation + Valvular Problems/Murmurs AI and MR  Rhythm:Regular Rate:Normal  CABG + Mitral valve annuloplasty 01/30/2017  EKG 02/05/23 Low voltage QRS Cannot rule out Inferior infarct , age undeterminate   Echo 11/26/23  1. Left ventricular ejection fraction, by estimation, is 45 to 50%. Left  ventricular ejection fraction by 3D volume is 50 %. The left ventricle has  mildly decreased function. The left ventricle has no regional wall motion  abnormalities. Left ventricular   diastolic function could not be evaluated. The average left ventricular  global longitudinal strain is -15.6 %. The global longitudinal strain is  abnormal.   2. Right ventricular systolic function is normal. The right ventricular  size is normal.   3. Left atrial size was moderately dilated.   4. Tricuspid valve regurgitation is moderate.   5. The aortic valve is normal in structure. Aortic valve regurgitation is  mild. PHT .   6. The mitral valve has been repaired/replaced. Mild to moderate mitral  valve regurgitation. The mean mitral valve gradient is 5.0 mmHg. There is  a prosthetic annuloplasty ring present in the mitral position.   Cardiac Cath 02/26/21  Total occlusion of the proximal native RCA  which receives collaterals from the LAD and left circumflex territory.  Patent left main  Focal 90% stenosis in the proximal LAD.  LAD fills competitively because of patent LIMA to the mid vessel.  Diffuse disease in the proximal and mid circumflex with patent saphenous vein graft to the 2 large obtuse marginal branches.  Widely patent LIMA to LAD.  Widely patent sequential saphenous vein graft to the first and second obtuse marginal branches.  Total occlusion at aorto-ostial junction, appearing chronic.    Neuro/Psych  Neuromuscular disease  negative psych ROS   GI/Hepatic negative GI ROS, Neg liver ROS,GERD  Medicated,,  Endo/Other  Hypothyroidism  Obesity HLD GLP-1 RA therapy- last dose 8/12  Renal/GU Renal InsufficiencyRenal disease  negative genitourinary   Musculoskeletal  (+) Arthritis , Osteoarthritis,  AVN left hip   Abdominal  (+) + obese  Peds  Hematology  (+) Blood dyscrasia, anemia SLE Hx/o ITP- Plt 235k   Anesthesia Other Findings   Reproductive/Obstetrics                              Anesthesia Physical Anesthesia Plan  ASA: 3  Anesthesia Plan: Spinal   Post-op Pain Management: Minimal or no pain anticipated   Induction: Intravenous  PONV Risk Score and Plan: 4 or greater and Treatment may vary due to age or medical condition and Propofol  infusion  Airway Management Planned: Natural Airway and Simple Face Mask  Additional Equipment: None  Intra-op Plan:   Post-operative Plan:   Informed Consent: I have reviewed the patients History and Physical, chart, labs and discussed  the procedure including the risks, benefits and alternatives for the proposed anesthesia with the patient or authorized representative who has indicated his/her understanding and acceptance.     Dental advisory given  Plan Discussed with: Anesthesiologist and CRNA  Anesthesia Plan Comments: (PAT note by Lynwood Hope, PA-C: 68 year old female  follows with cardiology for history of lupus erythematous (in remission), coronary artery disease with prior history of stenting and ultimately four-vessel bypass in 2018, mitral valve annuloplasty at the time of her CABG in 2018, postoperative atrial fibrillation, peripheral artery disease and ascending aortic aneurysm.  Cath 02/2019 showed CTO of proximal RCA with collaterals from the LAD and left circumflex, patent LIMA to LAD, patent SVG to the 2 large obtuse marginal branches, no intervention at that time, antianginal therapy recommended.  Echo 02/2022 showed EF 50 to 55%, normal RV function, left atrium severely dilated, mild mitral regurgitation.  Last seen by Dr. Sheena on 10/21/23 and noted to be doing well.  Updated echo was ordered to reassess previously repaired mitral valve.  Upcoming surgery was also discussed.  Per note, She is planned for surgery on September 9: The patient does not have any unstable cardiac conditions.  Upon evaluation today, she can achieve 4 METs or greater without anginal symptoms.  According to [facility] and AHA guidelines, she requires no further cardiac workup prior to her noncardiac surgery and should be at acceptable risk.  Our service is available as necessary in the perioperative period.  Echo 11/26/2023 showed LVEF 50%, normal RV, moderate tricuspid regurgitation, mild to moderate mitral regurgitation.  Other pertinent history includes former smoker (30 pack years, quit 2017), PONV, GERD on PPI, hypothyroid, OSA not on CPAP, CKD 3, immune thrombocytopenic purpura s/p splenectomy.  Patient reports last dose of Mounjaro  11/03/2023.  Preop labs reviewed, creatinine mildly elevated at 1.18, mild leukocytopenia with WBC 2.9, otherwise unremarkable.  EKG 02/05/2023: Normal sinus rhythm.  Rate 65. Low voltage QRS  TTE 11/26/2023: 1. Left ventricular ejection fraction, by estimation, is 45 to 50%. Left  ventricular ejection fraction by 3D volume is 50 %. The left ventricle  has  mildly decreased function. The left ventricle has no regional wall motion  abnormalities. Left ventricular  diastolic function could not be evaluated. The average left ventricular  global longitudinal strain is -15.6 %. The global longitudinal strain is  abnormal.  2. Right ventricular systolic function is normal. The right ventricular  size is normal.  3. Left atrial size was moderately dilated.  4. Tricuspid valve regurgitation is moderate.  5. The aortic valve is normal in structure. Aortic valve regurgitation is  mild. PHT .  6. The mitral valve has been repaired/replaced. Mild to moderate mitral  valve regurgitation. The mean mitral valve gradient is 5.0 mmHg. There is  a prosthetic annuloplasty ring present in the mitral position.   Cath 02/27/2019:  Total occlusion of the proximal native RCA which receives collaterals from the LAD and left circumflex territory.  Patent left main  Focal 90% stenosis in the proximal LAD.  LAD fills competitively because of patent LIMA to the mid vessel.  Diffuse disease in the proximal and mid circumflex with patent saphenous vein graft to the 2 large obtuse marginal branches.  Widely patent LIMA to LAD.  Widely patent sequential saphenous vein graft to the first and second obtuse marginal branches.  Total occlusion at aorto-ostial junction, appearing chronic.   )         Anesthesia Quick Evaluation

## 2023-11-30 ENCOUNTER — Encounter (HOSPITAL_COMMUNITY): Payer: Self-pay | Admitting: Orthopaedic Surgery

## 2023-11-30 NOTE — H&P (Signed)
 TOTAL HIP ADMISSION H&P  Patient is admitted for left total hip arthroplasty.  Subjective:  Chief Complaint: left hip pain  HPI: [name], 68 y.o. female, has a history of pain and functional disability in the left hip(s) due to avascular necrosis and patient has failed non-surgical conservative treatments for greater than 12 weeks to include use of assistive devices, weight reduction as appropriate, and activity modification.  Onset of symptoms was abrupt starting not long ago with gradually worsening course since that time.The patient noted no past surgery on the left hip(s).  Patient currently rates pain in the left hip at 10 out of 10 with activity. Patient has night pain, worsening of pain with activity and weight bearing, trendelenberg gait, pain that interfers with activities of daily living, and pain with passive range of motion. Patient has evidence of subchondral cysts by imaging studies. This condition presents safety issues increasing the risk of falls. This patient has had avascular necrosis of the hip, acetabular fracture, hip dysplasia.  There is no current active infection.  Patient Active Problem List   Diagnosis Date Noted   Status post total replacement of right hip 08/11/2023   Avascular necrosis of bone of right hip (HCC) 06/29/2023   Avascular necrosis of bone of left hip (HCC) 06/29/2023   Need for prophylactic vaccination with combined diphtheria-tetanus-pertussis (DTP) vaccine 03/02/2023   Need for RSV vaccination 03/02/2023   Vitamin B deficiency 06/26/2022   BMI 35.0-35.9,adult 06/26/2022   Drug-induced constipation 06/26/2022   Essential hypertension 06/26/2022   Allergic rhinitis 06/08/2022   Other hyperlipidemia 04/23/2022   Morbid obesity (HCC)-starting bmi 41.64 04/23/2022   BMI 36.0-36.9,adult-current bmi 04/23/2022   Insulin  resistance 03/25/2022   LRTI (lower respiratory tract infection) 01/24/2022   Vitamin D  deficiency 06/17/2021   Need for  shingles vaccine 04/08/2021   Encounter for general adult medical examination with abnormal findings 04/08/2021   Chronic heart failure with preserved ejection fraction (HCC) 04/03/2021   Hyperlipidemia LDL goal <70 04/03/2021   Screen for colon cancer 04/03/2021   Systemic lupus erythematosus (HCC) 02/25/2021   Stage 3a chronic kidney disease (HCC)    Hypertension 08/08/2020   Chronic idiopathic constipation 05/31/2020   Class 3 severe obesity with serious comorbidity and body mass index (BMI) of 40.0 to 44.9 in adult 05/31/2020   Postoperative atrial fibrillation (HCC) 02/18/2017   Pain in right hip 07/21/2016   Osteoarthritis of spine with radiculopathy, lumbar region 06/24/2016   Hypothyroidism 04/08/2011   Coronary artery disease involving coronary bypass graft of native heart with angina pectoris (HCC) 02/26/2009   Gastroesophageal reflux disease 08/05/2007   Systemic lupus erythematosus with focal and segmental proliferative glomerulonephritis (HCC) 08/05/2007   Past Medical History:  Diagnosis Date   Arthritis    Back pain    Bilateral swelling of feet and ankles    no current problems per pt 07/30/23   CAD (coronary artery disease)    a. remote stents/angioplasty. b. s/p CABG 01/2017 with mitral valve annuloplasty.   Chest pain    Constipation    Diastolic heart failure (HCC)    Esophageal reflux    Gallbladder problem    GERD (gastroesophageal reflux disease)    History of MI (myocardial infarction)    Hyperlipidemia    Hypertension    Hypothyroidism    Immune thrombocytopenic purpura (HCC)    Joint pain    Lupus erythematosus    in remission since 2013   Mitral valve insufficiency and aortic valve insufficiency  Myocardial infarction (HCC)    x 2   Pneumonia    x several   PONV (postoperative nausea and vomiting)    Postoperative atrial fibrillation (HCC)    Pre-diabetes    Renal disease    stage 3   Severe mitral regurgitation    a. s/p MV annuloplasty  01/2017 at time of CABG.   Shortness of breath on exertion    no current problems - lost weight, stop smoking and hgb wnl in 02/2023   Sleep apnea    does not use CPAP - patient states mild, lost weight   Status post mitral valve annuloplasty    Unspecified disease of pericardium     Past Surgical History:  Procedure Laterality Date   BREAST BIOPSY Right 06/03/2023   MM RT BREAST BX W LOC DEV 1ST LESION IMAGE BX SPEC STEREO GUIDE 06/03/2023 [facility] MAMMOGRAPHY   CHOLECYSTECTOMY, LAPAROSCOPIC  2010   CORONARY ARTERY BYPASS GRAFT N/A 01/30/2017   Procedure: CORONARY ARTERY BYPASS GRAFTING (CABG), Times four  using the right saphaneous vein, harvested endoscopicly.  and left internal mammary artery .;  Surgeon: Lucas Dorise POUR, MD;  Location: [facility] OR;  Service: Open Heart Surgery;  Laterality: N/A;   LEFT HEART CATH AND CORONARY ANGIOGRAPHY N/A 01/23/2017   Procedure: LEFT HEART CATH AND CORONARY ANGIOGRAPHY;  Surgeon: Claudene Victory ORN, MD;  Location: [facility] INVASIVE CV LAB;  Service: Cardiovascular;  Laterality: N/A;   MITRAL VALVE REPAIR N/A 01/30/2017   Procedure: MITRAL VALVE REPAIR (MVR);  Surgeon: Lucas Dorise POUR, MD;  Location: [facility] OR;  Service: Open Heart Surgery;  Laterality: N/A;   plastic surgical repair      dog bite to leg   RIGHT/LEFT HEART CATH AND CORONARY/GRAFT ANGIOGRAPHY N/A 02/26/2021   Procedure: RIGHT/LEFT HEART CATH AND CORONARY/GRAFT ANGIOGRAPHY;  Surgeon: Claudene Victory ORN, MD;  Location: [facility] INVASIVE CV LAB;  Service: Cardiovascular;  Laterality: N/A;   SPLENECTOMY  5-04   TEE WITHOUT CARDIOVERSION N/A 01/27/2017   Procedure: TRANSESOPHAGEAL ECHOCARDIOGRAM (TEE);  Surgeon: Francyne Headland, MD;  Location: [facility] ENDOSCOPY;  Service: Cardiovascular;  Laterality: N/A;   TEE WITHOUT CARDIOVERSION N/A 01/30/2017   Procedure: TRANSESOPHAGEAL ECHOCARDIOGRAM (TEE);  Surgeon: Lucas Dorise POUR, MD;  Location: [facility] OR;  Service: Open Heart Surgery;  Laterality: N/A;   TOTAL HIP ARTHROPLASTY Right  08/11/2023   Procedure: ARTHROPLASTY, HIP, TOTAL, ANTERIOR APPROACH;  Surgeon: Vernetta Lonni GRADE, MD;  Location: [facility] OR;  Service: Orthopedics;  Laterality: Right;   ULTRASOUND GUIDANCE FOR VASCULAR ACCESS  01/23/2017   Procedure: Ultrasound Guidance For Vascular Access;  Surgeon: Claudene Victory ORN, MD;  Location: [facility] INVASIVE CV LAB;  Service: Cardiovascular;;    No current facility-administered medications for this encounter.   Current Outpatient Medications  Medication Sig Dispense Refill Last Dose/Taking   albuterol  (VENTOLIN  HFA) 108 (90 Base) MCG/ACT inhaler Inhale 2 puffs into the lungs every 6 (six) hours as needed for wheezing or shortness of breath. 8 g 0 Taking As Needed   azaTHIOprine  (IMURAN ) 50 MG tablet TAKE 1 TABLET BY MOUTH IN THE MORNING AND IN THE EVENING 180 tablet 0 Taking   [employer] ASPIRIN  ADULT LOW DOSE 81 MG chewable tablet CHEW 1 TABLET (81 MG TOTAL) BY MOUTH 2 (TWO) TIMES DAILY. (Patient taking differently: Chew 81 mg by mouth daily.) 180 tablet 1 Taking Differently   cyclobenzaprine  (FLEXERIL ) 5 MG tablet Take 1 tablet (5 mg total) by mouth 3 (three) times daily as needed. (Patient taking differently: Take 5 mg  by mouth daily as needed for muscle spasms.) 30 tablet 0 Taking Differently   dapagliflozin  propanediol (FARXIGA ) 10 MG TABS tablet Take 1 tablet (10 mg total) by mouth daily. 90 tablet 3 Taking   fluticasone  (FLONASE ) 50 MCG/ACT nasal spray SPRAY 2 SPRAYS INTO EACH NOSTRIL EVERY DAY 48 mL 2 Unknown   furosemide  (LASIX ) 20 MG tablet TAKE 1 TABLET BY MOUTH EVERY DAY 90 tablet 3 Taking   HYDROcodone -acetaminophen  (NORCO/VICODIN) 5-325 MG tablet Take 1-2 tablets by mouth 3 (three) times daily as needed for moderate pain (pain score 4-6). (Patient taking differently: Take 1 tablet by mouth daily as needed for moderate pain (pain score 4-6).) 30 tablet 0 Taking Differently   hydroxychloroquine  (PLAQUENIL ) 200 MG tablet Take 200 mg by mouth 2 (two) times daily.   Taking    isosorbide  mononitrate (IMDUR ) 30 MG 24 hr tablet Take 1 tablet (30 mg total) by mouth daily. 90 tablet 3 Taking   levothyroxine  (SYNTHROID ) 75 MCG tablet Take 1 tablet (75 mcg total) by mouth daily before breakfast. 90 tablet 0 Taking   metoprolol  succinate (TOPROL -XL) 50 MG 24 hr tablet Take 1 tablet (50 mg total) by mouth daily. 90 tablet 3 Taking   nitroGLYCERIN  (NITROSTAT ) 0.4 MG SL tablet Place 1 tablet (0.4 mg total) under the tongue every 5 (five) minutes as needed for chest pain. 25 tablet 3 Taking As Needed   oxycodone  (OXY-IR) 5 MG capsule Take 1 capsule (5 mg total) by mouth every 4 (four) hours as needed. (Patient taking differently: Take 5 mg by mouth daily as needed for pain.) 20 capsule 0 Taking Differently   pantoprazole  (PROTONIX ) 40 MG tablet Take 1 tablet (40 mg total) by mouth daily. 90 tablet 3 Taking   polyethylene glycol powder (GLYCOLAX /MIRALAX ) 17 GM/SCOOP powder 17 gram po BID prn constipation (Patient taking differently: Take 17 g by mouth daily as needed for mild constipation or moderate constipation.) 850 g 0 Taking Differently   potassium chloride  SA (KLOR-CON  M) 20 MEQ tablet Take 1 tablet (20 mEq total) by mouth daily. 30 tablet 0 Taking   rosuvastatin  (CRESTOR ) 20 MG tablet Take 1 tablet (20 mg total) by mouth daily. 90 tablet 3 Taking   tirzepatide  (MOUNJARO ) 7.5 MG/0.5ML Pen Inject 7.5 mg into the skin once a week. (Patient taking differently: Inject 7.5 mg into the skin once a week. Takes on Tuesday) 2 mL 0 Taking Differently   Vitamin D , Ergocalciferol , (DRISDOL ) 1.25 MG (50000 UNIT) CAPS capsule Take 1 capsule (50,000 Units total) by mouth every 7 (seven) days. (Patient taking differently: Take 50,000 Units by mouth See admin instructions. Take 50,000 units by mouth every 10 days) 4 capsule 0 Taking Differently   pregabalin  (LYRICA ) 50 MG capsule TAKE 1 CAPSULE (50 MG TOTAL) BY MOUTH 2 (TWO) TIMES DAILY AS NEEDED (PAIN). (Patient not taking: Reported on 11/25/2023) 60  capsule 1 Not Taking   Allergies  Allergen Reactions   Amlodipine  Swelling    Leg edema   Cellcept  [Mycophenolate ] Nausea Only    Social History   Tobacco Use   Smoking status: Former    Current packs/day: 0.00    Average packs/day: 1 pack/day for 30.0 years (30.0 ttl pk-yrs)    Types: Cigarettes    Start date: 03/23/1986    Quit date: 12/23/2015    Years since quitting: 7.9    Passive exposure: Past   Smokeless tobacco: Never  Substance Use Topics   Alcohol use: No    Family History  Problem Relation Age of Onset   Heart disease Mother    Hyperlipidemia Mother    Cancer Mother        breast and cervical   Paget's disease of bone Mother    Fibromyalgia Mother    Breast cancer Mother    Hypertension Father    Heart disease Father        PVD   GER disease Father    Breast cancer Sister    Heart attack Maternal Grandmother    Cancer Paternal Grandfather        colon     Review of Systems  Objective:  Physical Exam Vitals reviewed.  Constitutional:      Appearance: Normal appearance. She is obese.  HENT:     Head: Normocephalic and atraumatic.  Eyes:     Extraocular Movements: Extraocular movements intact.     Pupils: Pupils are equal, round, and reactive to light.  Cardiovascular:     Rate and Rhythm: Normal rate.  Pulmonary:     Effort: Pulmonary effort is normal.     Breath sounds: Normal breath sounds.  Abdominal:     Palpations: Abdomen is soft.  Musculoskeletal:     Cervical back: Normal range of motion and neck supple.     Left hip: Tenderness and bony tenderness present. Decreased range of motion. Decreased strength.  Neurological:     Mental Status: She is alert and oriented to person, place, and time.  Psychiatric:        Behavior: Behavior normal.     Vital signs in last 24 hours:    Labs:   Estimated body mass index is 34.36 kg/m as calculated from the following:   Height as of 11/25/23: 5' 6 (1.676 m).   Weight as of 11/25/23: 96.6  kg.   Imaging Review A MRI demonstrates severe AVN of the left hip(s). The bone quality appears to be excellent for age and reported activity level.      Assessment/Plan:  Avascular necrosis, left hip(s)  The patient history, physical examination, clinical judgement of the provider and imaging studies are consistent with AVN of the left hip(s) and total hip arthroplasty is deemed medically necessary. The treatment options including medical management, injection therapy, arthroscopy and arthroplasty were discussed at length. The risks and benefits of total hip arthroplasty were presented and reviewed. The risks due to aseptic loosening, infection, stiffness, dislocation/subluxation,  thromboembolic complications and other imponderables were discussed.  The patient acknowledged the explanation, agreed to proceed with the plan and consent was signed. Patient is being admitted for inpatient treatment for surgery, pain control, PT, OT, prophylactic antibiotics, VTE prophylaxis, progressive ambulation and ADL's and discharge planning.The patient is planning to be discharged home with home health services

## 2023-12-01 ENCOUNTER — Ambulatory Visit (HOSPITAL_COMMUNITY): Payer: Self-pay | Admitting: Physician Assistant

## 2023-12-01 ENCOUNTER — Encounter (HOSPITAL_COMMUNITY): Payer: Self-pay | Admitting: Orthopaedic Surgery

## 2023-12-01 ENCOUNTER — Other Ambulatory Visit: Payer: Self-pay

## 2023-12-01 ENCOUNTER — Encounter (HOSPITAL_COMMUNITY)
Admission: RE | Disposition: A | Discharge status: Home Health | Payer: Self-pay | Source: Home / Self Care | Attending: Orthopaedic Surgery

## 2023-12-01 ENCOUNTER — Inpatient Hospital Stay (HOSPITAL_COMMUNITY): Payer: Self-pay | Admitting: Anesthesiology

## 2023-12-01 ENCOUNTER — Observation Stay (HOSPITAL_COMMUNITY)

## 2023-12-01 ENCOUNTER — Inpatient Hospital Stay (HOSPITAL_COMMUNITY)
Admission: RE | Admit: 2023-12-01 | Discharge: 2023-12-03 | DRG: 470 | Disposition: A | Discharge status: Home Health | Attending: Orthopaedic Surgery | Admitting: Orthopaedic Surgery

## 2023-12-01 ENCOUNTER — Ambulatory Visit (HOSPITAL_COMMUNITY)

## 2023-12-01 DIAGNOSIS — M879 Osteonecrosis, unspecified: Principal | ICD-10-CM | POA: Diagnosis present

## 2023-12-01 DIAGNOSIS — Z79899 Other long term (current) drug therapy: Secondary | ICD-10-CM

## 2023-12-01 DIAGNOSIS — I2510 Atherosclerotic heart disease of native coronary artery without angina pectoris: Secondary | ICD-10-CM | POA: Diagnosis present

## 2023-12-01 DIAGNOSIS — Z8249 Family history of ischemic heart disease and other diseases of the circulatory system: Secondary | ICD-10-CM

## 2023-12-01 DIAGNOSIS — Z83438 Family history of other disorder of lipoprotein metabolism and other lipidemia: Secondary | ICD-10-CM

## 2023-12-01 DIAGNOSIS — N1831 Chronic kidney disease, stage 3a: Secondary | ICD-10-CM | POA: Diagnosis present

## 2023-12-01 DIAGNOSIS — E7849 Other hyperlipidemia: Secondary | ICD-10-CM | POA: Diagnosis present

## 2023-12-01 DIAGNOSIS — I130 Hypertensive heart and chronic kidney disease with heart failure and stage 1 through stage 4 chronic kidney disease, or unspecified chronic kidney disease: Secondary | ICD-10-CM | POA: Diagnosis present

## 2023-12-01 DIAGNOSIS — M87052 Idiopathic aseptic necrosis of left femur: Principal | ICD-10-CM | POA: Diagnosis present

## 2023-12-01 DIAGNOSIS — Z7985 Long-term (current) use of injectable non-insulin antidiabetic drugs: Secondary | ICD-10-CM

## 2023-12-01 DIAGNOSIS — Z7982 Long term (current) use of aspirin: Secondary | ICD-10-CM

## 2023-12-01 DIAGNOSIS — I5032 Chronic diastolic (congestive) heart failure: Secondary | ICD-10-CM | POA: Diagnosis present

## 2023-12-01 DIAGNOSIS — I252 Old myocardial infarction: Secondary | ICD-10-CM

## 2023-12-01 DIAGNOSIS — Z96642 Presence of left artificial hip joint: Secondary | ICD-10-CM

## 2023-12-01 DIAGNOSIS — Z79890 Hormone replacement therapy (postmenopausal): Secondary | ICD-10-CM

## 2023-12-01 DIAGNOSIS — E039 Hypothyroidism, unspecified: Secondary | ICD-10-CM | POA: Diagnosis present

## 2023-12-01 DIAGNOSIS — K219 Gastro-esophageal reflux disease without esophagitis: Secondary | ICD-10-CM | POA: Diagnosis present

## 2023-12-01 DIAGNOSIS — Z87891 Personal history of nicotine dependence: Secondary | ICD-10-CM

## 2023-12-01 DIAGNOSIS — M329 Systemic lupus erythematosus, unspecified: Secondary | ICD-10-CM | POA: Diagnosis present

## 2023-12-01 DIAGNOSIS — Z803 Family history of malignant neoplasm of breast: Secondary | ICD-10-CM

## 2023-12-01 DIAGNOSIS — Z9049 Acquired absence of other specified parts of digestive tract: Secondary | ICD-10-CM

## 2023-12-01 DIAGNOSIS — Z9081 Acquired absence of spleen: Secondary | ICD-10-CM

## 2023-12-01 HISTORY — PX: TOTAL HIP ARTHROPLASTY: SHX124

## 2023-12-01 LAB — GLUCOSE, CAPILLARY: Glucose-Capillary: 100 mg/dL — ABNORMAL HIGH (ref 70–99)

## 2023-12-01 SURGERY — ARTHROPLASTY, HIP, TOTAL, ANTERIOR APPROACH
Anesthesia: Spinal | Site: Hip | Laterality: Left

## 2023-12-01 MED ORDER — METOCLOPRAMIDE HCL 5 MG PO TABS
5.0000 mg | ORAL_TABLET | Freq: Three times a day (TID) | ORAL | Status: DC | PRN
  Filled 2023-12-01: qty 2

## 2023-12-01 MED ORDER — POLYETHYLENE GLYCOL 3350 17 G PO PACK: 17.0000 g | PACK | Freq: Every day | ORAL | Status: DC | PRN

## 2023-12-01 MED ORDER — ISOSORBIDE MONONITRATE ER 30 MG PO TB24
30.0000 mg | ORAL_TABLET | Freq: Every day | ORAL | Status: DC
Start: 2023-12-02 — End: 2023-12-03
  Administered 2023-12-02 – 2023-12-03 (×2): 30 mg via ORAL
  Filled 2023-12-01 (×2): qty 1

## 2023-12-01 MED ORDER — ALBUTEROL SULFATE (2.5 MG/3ML) 0.083% IN NEBU: 3.0000 mL | INHALATION_SOLUTION | Freq: Four times a day (QID) | RESPIRATORY_TRACT | Status: DC | PRN

## 2023-12-01 MED ORDER — OXYCODONE HCL 5 MG/5ML PO SOLN: 5.0000 mg | Freq: Once | ORAL | Status: DC | PRN

## 2023-12-01 MED ORDER — CYCLOBENZAPRINE HCL 5 MG PO TABS
5.0000 mg | ORAL_TABLET | Freq: Three times a day (TID) | ORAL | Status: DC | PRN
  Administered 2023-12-01 – 2023-12-03 (×3): 5 mg via ORAL
  Filled 2023-12-01 (×3): qty 1

## 2023-12-01 MED ORDER — METOCLOPRAMIDE HCL 5 MG/ML IJ SOLN: 5.0000 mg | Freq: Three times a day (TID) | INTRAMUSCULAR | Status: DC | PRN

## 2023-12-01 MED ORDER — OXYCODONE HCL 5 MG PO TABS
ORAL_TABLET | ORAL | Status: AC
  Filled 2023-12-01: qty 2

## 2023-12-01 MED ORDER — ALBUMIN HUMAN 5 % IV SOLN: INTRAVENOUS | Status: DC | PRN

## 2023-12-01 MED ORDER — CEFAZOLIN SODIUM-DEXTROSE 2-4 GM/100ML-% IV SOLN
2.0000 g | Freq: Four times a day (QID) | INTRAVENOUS | Status: AC
  Administered 2023-12-01 (×2): 2 g via INTRAVENOUS
  Filled 2023-12-01: qty 100

## 2023-12-01 MED ORDER — PROPOFOL 10 MG/ML IV BOLUS
INTRAVENOUS | Status: AC
  Filled 2023-12-01: qty 20

## 2023-12-01 MED ORDER — POVIDONE-IODINE 10 % EX SWAB
2.0000 | Freq: Once | CUTANEOUS | Status: AC
Start: 2023-12-01 — End: 2023-12-01
  Administered 2023-12-01: 2 via TOPICAL

## 2023-12-01 MED ORDER — OXYCODONE HCL 5 MG PO TABS
10.0000 mg | ORAL_TABLET | ORAL | Status: DC | PRN
  Administered 2023-12-01 – 2023-12-02 (×2): 10 mg via ORAL
  Filled 2023-12-01 (×3): qty 2

## 2023-12-01 MED ORDER — OXYCODONE HCL 5 MG PO TABS: 5.0000 mg | ORAL_TABLET | Freq: Once | ORAL | Status: DC | PRN

## 2023-12-01 MED ORDER — CEFAZOLIN SODIUM-DEXTROSE 2-4 GM/100ML-% IV SOLN
INTRAVENOUS | Status: AC
  Filled 2023-12-01: qty 100

## 2023-12-01 MED ORDER — CEFAZOLIN SODIUM-DEXTROSE 2-4 GM/100ML-% IV SOLN
2.0000 g | INTRAVENOUS | Status: AC
  Administered 2023-12-01: 2 g via INTRAVENOUS
  Filled 2023-12-01: qty 100

## 2023-12-01 MED ORDER — PHENYLEPHRINE HCL-NACL 20-0.9 MG/250ML-% IV SOLN
INTRAVENOUS | Status: DC | PRN
  Administered 2023-12-01: 40 ug/min via INTRAVENOUS

## 2023-12-01 MED ORDER — SODIUM CHLORIDE 0.9 % IR SOLN
Status: DC | PRN
  Administered 2023-12-01: 1

## 2023-12-01 MED ORDER — PHENOL 1.4 % MT LIQD: 1.0000 | OROMUCOSAL | Status: DC | PRN

## 2023-12-01 MED ORDER — NITROGLYCERIN 0.4 MG SL SUBL: 0.4000 mg | SUBLINGUAL_TABLET | SUBLINGUAL | Status: DC | PRN

## 2023-12-01 MED ORDER — DIPHENHYDRAMINE HCL 12.5 MG/5ML PO ELIX: 12.5000 mg | ORAL_SOLUTION | ORAL | Status: DC | PRN

## 2023-12-01 MED ORDER — ALBUMIN HUMAN 5 % IV SOLN
INTRAVENOUS | Status: AC
  Filled 2023-12-01: qty 250

## 2023-12-01 MED ORDER — PANTOPRAZOLE SODIUM 40 MG PO TBEC
40.0000 mg | DELAYED_RELEASE_TABLET | Freq: Every day | ORAL | Status: DC
Start: 2023-12-02 — End: 2023-12-03
  Administered 2023-12-02 – 2023-12-03 (×2): 40 mg via ORAL
  Filled 2023-12-01 (×2): qty 1

## 2023-12-01 MED ORDER — DAPAGLIFLOZIN PROPANEDIOL 10 MG PO TABS
10.0000 mg | ORAL_TABLET | Freq: Every day | ORAL | Status: DC
Start: 2023-12-02 — End: 2023-12-03
  Administered 2023-12-02 – 2023-12-03 (×2): 10 mg via ORAL
  Filled 2023-12-01 (×2): qty 1

## 2023-12-01 MED ORDER — PROPOFOL 10 MG/ML IV BOLUS
INTRAVENOUS | Status: DC | PRN
  Administered 2023-12-01: 20 mg via INTRAVENOUS

## 2023-12-01 MED ORDER — DOCUSATE SODIUM 100 MG PO CAPS
100.0000 mg | ORAL_CAPSULE | Freq: Two times a day (BID) | ORAL | Status: DC
  Administered 2023-12-01 – 2023-12-02 (×3): 100 mg via ORAL
  Filled 2023-12-01 (×4): qty 1

## 2023-12-01 MED ORDER — DEXAMETHASONE SODIUM PHOSPHATE 10 MG/ML IJ SOLN
INTRAMUSCULAR | Status: AC
  Filled 2023-12-01: qty 1

## 2023-12-01 MED ORDER — HYDROMORPHONE HCL 1 MG/ML IJ SOLN: 0.5000 mg | INTRAMUSCULAR | Status: DC | PRN

## 2023-12-01 MED ORDER — TRANEXAMIC ACID-NACL 1000-0.7 MG/100ML-% IV SOLN
1000.0000 mg | INTRAVENOUS | Status: AC
Start: 2023-12-01 — End: 2023-12-01
  Administered 2023-12-01: 1000 mg via INTRAVENOUS
  Filled 2023-12-01: qty 100

## 2023-12-01 MED ORDER — ASPIRIN 81 MG PO CHEW
81.0000 mg | CHEWABLE_TABLET | Freq: Two times a day (BID) | ORAL | Status: DC
  Administered 2023-12-01 – 2023-12-03 (×4): 81 mg via ORAL
  Filled 2023-12-01 (×4): qty 1

## 2023-12-01 MED ORDER — FENTANYL CITRATE (PF) 100 MCG/2ML IJ SOLN: 25.0000 ug | INTRAMUSCULAR | Status: DC | PRN

## 2023-12-01 MED ORDER — ALBUMIN HUMAN 5 % IV SOLN
12.5000 g | Freq: Once | INTRAVENOUS | Status: AC
  Administered 2023-12-01: 12.5 g via INTRAVENOUS

## 2023-12-01 MED ORDER — CHLORHEXIDINE GLUCONATE 0.12 % MT SOLN
15.0000 mL | Freq: Once | OROMUCOSAL | Status: AC
  Administered 2023-12-01: 15 mL via OROMUCOSAL
  Filled 2023-12-01: qty 15

## 2023-12-01 MED ORDER — FENTANYL CITRATE (PF) 250 MCG/5ML IJ SOLN
INTRAMUSCULAR | Status: AC
  Filled 2023-12-01: qty 5

## 2023-12-01 MED ORDER — METOPROLOL SUCCINATE ER 50 MG PO TB24
50.0000 mg | ORAL_TABLET | Freq: Every day | ORAL | Status: DC
Start: 2023-12-02 — End: 2023-12-03
  Administered 2023-12-03: 50 mg via ORAL
  Filled 2023-12-01: qty 1

## 2023-12-01 MED ORDER — MENTHOL 3 MG MT LOZG: 1.0000 | LOZENGE | OROMUCOSAL | Status: DC | PRN

## 2023-12-01 MED ORDER — PROPOFOL 500 MG/50ML IV EMUL
INTRAVENOUS | Status: DC | PRN
  Administered 2023-12-01: 40 ug/kg/min via INTRAVENOUS

## 2023-12-01 MED ORDER — ONDANSETRON HCL 4 MG/2ML IJ SOLN: 4.0000 mg | Freq: Once | INTRAMUSCULAR | Status: DC | PRN

## 2023-12-01 MED ORDER — OXYCODONE HCL 5 MG PO TABS
5.0000 mg | ORAL_TABLET | ORAL | Status: DC | PRN
  Administered 2023-12-01 – 2023-12-02 (×2): 5 mg via ORAL
  Administered 2023-12-02: 10 mg via ORAL
  Filled 2023-12-01 (×2): qty 2

## 2023-12-01 MED ORDER — ZOLPIDEM TARTRATE 5 MG PO TABS: 5.0000 mg | ORAL_TABLET | Freq: Every evening | ORAL | Status: DC | PRN

## 2023-12-01 MED ORDER — ACETAMINOPHEN 325 MG PO TABS
325.0000 mg | ORAL_TABLET | Freq: Four times a day (QID) | ORAL | Status: DC | PRN
  Administered 2023-12-02 – 2023-12-03 (×3): 650 mg via ORAL
  Filled 2023-12-01 (×3): qty 2

## 2023-12-01 MED ORDER — MIDAZOLAM HCL 2 MG/2ML IJ SOLN
INTRAMUSCULAR | Status: AC
  Filled 2023-12-01: qty 2

## 2023-12-01 MED ORDER — MIDAZOLAM HCL 2 MG/2ML IJ SOLN
INTRAMUSCULAR | Status: DC | PRN
  Administered 2023-12-01 (×2): 1 mg via INTRAVENOUS

## 2023-12-01 MED ORDER — DEXAMETHASONE SODIUM PHOSPHATE 10 MG/ML IJ SOLN
INTRAMUSCULAR | Status: DC | PRN
  Administered 2023-12-01: 5 mg via INTRAVENOUS

## 2023-12-01 MED ORDER — HYDROXYCHLOROQUINE SULFATE 200 MG PO TABS
200.0000 mg | ORAL_TABLET | Freq: Every day | ORAL | Status: DC
  Administered 2023-12-01 – 2023-12-03 (×3): 200 mg via ORAL
  Filled 2023-12-01 (×3): qty 1

## 2023-12-01 MED ORDER — ONDANSETRON HCL 4 MG/2ML IJ SOLN: 4.0000 mg | Freq: Four times a day (QID) | INTRAMUSCULAR | Status: DC | PRN

## 2023-12-01 MED ORDER — POTASSIUM CHLORIDE CRYS ER 20 MEQ PO TBCR
20.0000 meq | EXTENDED_RELEASE_TABLET | Freq: Every day | ORAL | Status: DC
  Administered 2023-12-02 – 2023-12-03 (×2): 20 meq via ORAL
  Filled 2023-12-01 (×2): qty 1

## 2023-12-01 MED ORDER — EPHEDRINE 5 MG/ML INJ
INTRAVENOUS | Status: AC
  Filled 2023-12-01: qty 5

## 2023-12-01 MED ORDER — SODIUM CHLORIDE 0.9 % IV SOLN: INTRAVENOUS | Status: DC

## 2023-12-01 MED ORDER — ONDANSETRON HCL 4 MG PO TABS: 4.0000 mg | ORAL_TABLET | Freq: Four times a day (QID) | ORAL | Status: DC | PRN

## 2023-12-01 MED ORDER — PROPOFOL 1000 MG/100ML IV EMUL
INTRAVENOUS | Status: AC
  Filled 2023-12-01: qty 100

## 2023-12-01 MED ORDER — ORAL CARE MOUTH RINSE: 15.0000 mL | Freq: Once | OROMUCOSAL | Status: AC

## 2023-12-01 MED ORDER — BUPIVACAINE IN DEXTROSE 0.75-8.25 % IT SOLN
INTRATHECAL | Status: DC | PRN
  Administered 2023-12-01: 1.8 mL via INTRATHECAL

## 2023-12-01 MED ORDER — AZATHIOPRINE 50 MG PO TABS
50.0000 mg | ORAL_TABLET | Freq: Two times a day (BID) | ORAL | Status: DC
  Administered 2023-12-01 – 2023-12-03 (×4): 50 mg via ORAL
  Filled 2023-12-01 (×5): qty 1

## 2023-12-01 MED ORDER — LACTATED RINGERS IV SOLN
INTRAVENOUS | Status: DC
Start: 2023-12-01 — End: 2023-12-01

## 2023-12-01 MED ORDER — ROSUVASTATIN CALCIUM 20 MG PO TABS
20.0000 mg | ORAL_TABLET | Freq: Every day | ORAL | Status: DC
  Administered 2023-12-01 – 2023-12-03 (×3): 20 mg via ORAL
  Filled 2023-12-01 (×3): qty 1

## 2023-12-01 MED ORDER — LEVOTHYROXINE SODIUM 75 MCG PO TABS
75.0000 ug | ORAL_TABLET | Freq: Every day | ORAL | Status: DC
  Administered 2023-12-02 – 2023-12-03 (×2): 75 ug via ORAL
  Filled 2023-12-01 (×2): qty 1

## 2023-12-01 MED ORDER — EPHEDRINE SULFATE-NACL 50-0.9 MG/10ML-% IV SOSY
PREFILLED_SYRINGE | INTRAVENOUS | Status: DC | PRN
  Administered 2023-12-01: 5 mg via INTRAVENOUS

## 2023-12-01 MED ORDER — 0.9 % SODIUM CHLORIDE (POUR BTL) OPTIME
TOPICAL | Status: DC | PRN
  Administered 2023-12-01: 1000 mL

## 2023-12-01 MED ORDER — ALUM & MAG HYDROXIDE-SIMETH 200-200-20 MG/5ML PO SUSP: 30.0000 mL | ORAL | Status: DC | PRN

## 2023-12-01 MED ORDER — ONDANSETRON HCL 4 MG/2ML IJ SOLN
INTRAMUSCULAR | Status: AC
  Filled 2023-12-01: qty 2

## 2023-12-01 SURGICAL SUPPLY — 45 items
BAG COUNTER SPONGE SURGICOUNT (BAG) ×1 IMPLANT
BALL HIP CERAMIC 32MM PLUS 9 IMPLANT
BENZOIN TINCTURE PRP APPL 2/3 (GAUZE/BANDAGES/DRESSINGS) ×1 IMPLANT
BLADE CLIPPER SURG (BLADE) IMPLANT
BLADE SAW SGTL 18X1.27X75 (BLADE) ×1 IMPLANT
COVER SURGICAL LIGHT HANDLE (MISCELLANEOUS) ×1 IMPLANT
CUP SECTOR GRIPTON 50MM (Cup) IMPLANT
DRAPE C-ARM 42X72 X-RAY (DRAPES) ×1 IMPLANT
DRAPE STERI IOBAN 125X83 (DRAPES) ×1 IMPLANT
DRAPE U-SHAPE 47X51 STRL (DRAPES) ×3 IMPLANT
DRSG AQUACEL AG ADV 3.5X10 (GAUZE/BANDAGES/DRESSINGS) ×1 IMPLANT
DURAPREP 26ML APPLICATOR (WOUND CARE) ×1 IMPLANT
ELECT BLADE 6.5 EXT (BLADE) IMPLANT
ELECTRODE BLDE 4.0 EZ CLN MEGD (MISCELLANEOUS) ×1 IMPLANT
ELECTRODE REM PT RTRN 9FT ADLT (ELECTROSURGICAL) ×1 IMPLANT
FACESHIELD WRAPAROUND OR TEAM (MASK) ×2 IMPLANT
GAUZE XEROFORM 1X8 LF (GAUZE/BANDAGES/DRESSINGS) IMPLANT
GLOVE BIOGEL PI IND STRL 8 (GLOVE) ×2 IMPLANT
GLOVE ECLIPSE 8.0 STRL XLNG CF (GLOVE) ×1 IMPLANT
GLOVE ORTHO TXT STRL SZ7.5 (GLOVE) ×2 IMPLANT
GOWN STRL REUS W/ TWL LRG LVL3 (GOWN DISPOSABLE) ×2 IMPLANT
GOWN STRL REUS W/ TWL XL LVL3 (GOWN DISPOSABLE) ×2 IMPLANT
KIT BASIN OR (CUSTOM PROCEDURE TRAY) ×1 IMPLANT
KIT TURNOVER KIT B (KITS) ×1 IMPLANT
LINER ACET PNNCL PLUS4 NEUTRAL (Hips) IMPLANT
MANIFOLD NEPTUNE II (INSTRUMENTS) ×1 IMPLANT
NS IRRIG 1000ML POUR BTL (IV SOLUTION) ×1 IMPLANT
PACK TOTAL JOINT (CUSTOM PROCEDURE TRAY) ×1 IMPLANT
PAD ARMBOARD POSITIONER FOAM (MISCELLANEOUS) ×1 IMPLANT
SET HNDPC FAN SPRY TIP SCT (DISPOSABLE) ×1 IMPLANT
STAPLER SKIN PROX 35W (STAPLE) IMPLANT
STEM FEM ACTIS HIGH SZ2 (Stem) IMPLANT
STRIP CLOSURE SKIN 1/2X4 (GAUZE/BANDAGES/DRESSINGS) ×2 IMPLANT
SUT ETHIBOND NAB CT1 #1 30IN (SUTURE) ×1 IMPLANT
SUT ETHILON 2 0 FS 18 (SUTURE) IMPLANT
SUT MNCRL AB 4-0 PS2 18 (SUTURE) IMPLANT
SUT VIC AB 0 CT1 27XBRD ANBCTR (SUTURE) ×1 IMPLANT
SUT VIC AB 1 CT1 27XBRD ANBCTR (SUTURE) ×1 IMPLANT
SUT VIC AB 2-0 CT1 TAPERPNT 27 (SUTURE) ×1 IMPLANT
TOWEL GREEN STERILE (TOWEL DISPOSABLE) ×1 IMPLANT
TOWEL GREEN STERILE FF (TOWEL DISPOSABLE) ×1 IMPLANT
TRAY CATH INTERMITTENT SS 16FR (CATHETERS) IMPLANT
TRAY FOLEY W/BAG SLVR 14FR (SET/KITS/TRAYS/PACK) IMPLANT
TRAY FOLEY W/BAG SLVR 16FR ST (SET/KITS/TRAYS/PACK) IMPLANT
WATER STERILE IRR 1000ML POUR (IV SOLUTION) ×2 IMPLANT

## 2023-12-01 NOTE — Anesthesia Procedure Notes (Signed)
 Spinal  Patient location during procedure: OR Start time: 12/01/2023 7:36 AM End time: 12/01/2023 7:40 AM Reason for block: surgical anesthesia Staffing Performed: anesthesiologist  Anesthesiologist: Jerrye Sharper, MD Performed by: Jerrye Sharper, MD Authorized by: Jerrye Sharper, MD   Preanesthetic Checklist Completed: patient identified, IV checked, site marked, risks and benefits discussed, surgical consent, monitors and equipment checked, pre-op evaluation and timeout performed Spinal Block Patient position: sitting Prep: DuraPrep and site prepped and draped Patient monitoring: heart rate, cardiac monitor, continuous pulse ox and blood pressure Approach: midline Location: L3-4 Injection technique: single-shot Needle Needle type: Pencan  Needle gauge: 24 G Needle length: 9 cm Needle insertion depth: 8 cm Assessment Sensory level: T6 Events: CSF return Additional Notes Patient tolerated procedure well. Adequate sensory level.

## 2023-12-01 NOTE — Anesthesia Procedure Notes (Signed)
 Procedure Name: MAC Date/Time: 12/01/2023 7:31 AM  Performed by: Jolynn Mage, CRNAPre-anesthesia Checklist: Patient identified, Emergency Drugs available, Suction available, Timeout performed and Patient being monitored Patient Re-evaluated:Patient Re-evaluated prior to induction Oxygen Delivery Method: Simple face mask

## 2023-12-01 NOTE — Op Note (Signed)
 Operative Note  Date of operation: 12/01/2023 Preoperative diagnosis: Left hip avascular necrosis Postoperative diagnosis: Same  Procedure: Left direct anterior total hip arthroplasty  Implants: Implant Name Type Inv. Item Serial No. Manufacturer Lot No. LRB No. Used Action  CUP SECTOR GRIPTON - [id number] Cup CUP SECTOR GRIPTON  [employer]PU[employer] [id number] Left 1 Implanted  LINER ACET PNNCL PLUS4 NEUTRAL - [id number] Hips LINER ACET PNNCL PLUS4 NEUTRAL  DEPUY ORTHOPAEDICS [id number] Left 1 Implanted  STEM FEM ACTIS HIGH SZ2 - [id number] Stem STEM FEM ACTIS HIGH SZ2  DEPUY ORTHOPAEDICS [id number] Left 1 Implanted  BALL HIP CERAMIC PLUS 9 - [id number]  BALL HIP CERAMIC PLUS 9  DEPUY ORTHOPA[employer] [id number] Left 1 Implanted   Surgeon: Lonni GRADE. Vernetta, MD Assistant: Tory Gaskins, PA-C  Anesthesia: Spinal Antibiotics: IV Ancef  EBL 100 cc Complications none  Indications: The patient is a 68 year old female with known avascular porosis of her left hip.  We actually replaced her right hip back in May of this year secondary to AVN as well.  She does have daily left hip pain in the left hip does have radiographic findings on MRI consistent with avascular necrosis.  She has done well with her right hip replacement.  Having had this before she is fully aware of the risks of acute blood loss anemia, nerve and vessel injury, fracture, infection, DVT, dislocation, implant failure, leg length differences and wound healing issues.  She understands that our goals are hopefully decreased pain, improved mobility and improve quality of life.  Procedure description: After informed consent was obtained and the appropriate left hip was marked, the patient was brought to the operating room and set up on the stretcher where spinal anesthesia was obtained.  She was then laid in the supine position on stretcher and a Foley catheter was placed.  Next traction boots were placed on both her feet and she  was placed supine on the Hana fracture table with a perineal post and placed in both legs and inline skeletal traction devices no traction applied.  Her left operative hip and pelvis were assessed radiographically.  The left hip was then prepped and draped in DuraPrep and sterile drapes.  A timeout was called and she was identified as the correct patient the correct left hip.  An incision was then made just inferior and posterior to the ASIS and carried slightly obliquely down the leg.  Dissection was carried down to the tensor fascia lata muscle and the tensor fascia was then divided longitudinally to proceed with a direct intra approach the hip.  Circumflex vessels were identified and cauterized.  The hip capsule was identified and opened up in L-type format.  Cobra tractors were placed around the medial and lateral femoral neck and a femoral neck cut was made with an oscillating saw just proximal to the lesser trochanter and this cut was completed with an osteotome.  A corkscrew guide was placed in the femoral head and the femoral head was removed in its entirety.  A bent Hohmann was then placed over the medial acetabular rim and remnants of the acetabular labrum and other debris removed.  Reaming was then initiated from a size 43 reamer and stepwise increments going up to a size 49 reamer with all reamers placed under direct visualization and the last reamer also placed under direct fluoroscopy in order to obtain our depth of reaming, the inclination and the anteversion.  The real DePuy sector GRIPTION acetabular component size 50 was then placed  without difficulty followed by a 32+4 polythene liner.  Attention was then turned to the femur.  With the left leg externally rotated to 120 degrees, extended and adducted, a Mueller retractor was placed medially and Hohmann tractor migrated trochanter.  The lateral joint capsule was released and a box cutting osteotome was used into the femoral canal.  Broaching was  then initiated using the Actis broaching system from a size 0 going up to a size 2.  With a size 2 in place we trialed a high offset femoral neck and a 32+1 trial head ball.  The left leg was brought over and up and with traction and internal rotation reduced in the pelvis.  Based on radiograph and clinical assessment we needed more leg length like her other side.  We dislocated the hip and remove the trial components.  We then placed the real Actis femoral component with high offset size 2 and with a 32+9 ceramic head ball.  Again this reduced the pelvis and replaced with stability and radiographic assessment including assessment of leg length and offset.  Stability was the most important assessment.  It was stable.  We then irrigated the soft tissue with normal saline solution.  Remnants of the joint capsule were closed with interrupted #1 Ethibond suture followed by #1 Vicryl to close the tensor fascia.  0 Vicryl was used to close the deep tissue and 2-0 Vicryl was used to close subcutaneous tissue.  The skin was closed with interrupted 2-0 nylon suture.  An Aquacel dressing was applied.  The patient was taken off the Hana table and taken recovery room.  Tory Gaskins, PA-C did assist during the entire case and beginning in and his assistance was crucial and medically necessary for soft tissue management and retraction, helping guide implant placement and a layered closure of the wound.

## 2023-12-01 NOTE — Evaluation (Signed)
 Physical Therapy Evaluation Patient Details Name: [name] MRN: [id number] DOB: 1956/03/18 Today's Date: 12/01/2023  History of Present Illness  68 y.o. female presents to [facility] hospital on 12/01/2023 for elective L THA. PMH: R THA, obesity, chronic heart failure, CKD 3a, CAD s/p CABG.  Clinical Impression  Pt presents to PT with deficits in functional mobility, gait, balance, strength, ROM. Pt is able to ambulate for household distances with support of the RW. PT provides education on the THA exercise packet and encourages frequent mobilization with staff assistance. PT will follow up tomorrow for a progression of gait and to initiate stair training.           If plan is discharge home, recommend the following: A little help with bathing/dressing/bathroom;Assistance with cooking/housework;Assist for transportation;Help with stairs or ramp for entrance   Can travel by private vehicle        Equipment Recommendations None recommended by PT  Recommendations for Other Services       Functional Status Assessment Patient has had a recent decline in their functional status and demonstrates the ability to make significant improvements in function in a reasonable and predictable amount of time.     Precautions / Restrictions Precautions Precautions: Fall Recall of Precautions/Restrictions: Intact Precaution/Restrictions Comments: direct anterior THA, no precautions Restrictions Weight Bearing Restrictions Per Provider Order: Yes LLE Weight Bearing Per Provider Order: Weight bearing as tolerated      Mobility  Bed Mobility Overal bed mobility: Needs Assistance Bed Mobility: Supine to Sit, Sit to Supine     Supine to sit: Supervision Sit to supine: Supervision        Transfers Overall transfer level: Needs assistance Equipment used: Rolling walker (2 wheels) Transfers: Sit to/from Stand Sit to Stand: Supervision                Ambulation/Gait Ambulation/Gait  assistance: Supervision Gait Distance (Feet): 200 Feet Assistive device: Rolling walker (2 wheels) Gait Pattern/deviations: Step-through pattern Gait velocity: reduced Gait velocity interpretation: <1.8 ft/sec, indicate of risk for recurrent falls   General Gait Details: slowed step-through gait, pt with LLE internal rotation near end of swing phase of gait, reports this to be baseline  Careers information officer     Tilt Bed    Modified Rankin (Stroke Patients Only)       Balance Overall balance assessment: Needs assistance Sitting-balance support: No upper extremity supported, Feet supported Sitting balance-Leahy Scale: Good     Standing balance support: Single extremity supported, Reliant on assistive device for balance Standing balance-Leahy Scale: Poor                               Pertinent Vitals/Pain Pain Assessment Pain Assessment: 0-10 Pain Score: 4  Pain Location: L thigh Pain Descriptors / Indicators: Sore Pain Intervention(s): Monitored during session    Home Living Family/patient expects to be discharged to:: Private residence Living Arrangements: Alone Available Help at Discharge: Friend(s) Type of Home: House Home Access: Level entry     Alternate Level Stairs-Number of Steps: 17 Home Layout: Two level Home Equipment: Agricultural consultant (2 wheels);Cane - single point;BSC/3in1 Additional Comments: has a stair lift but does not typically utilize it    Prior Function Prior Level of Function : Independent/Modified Independent             Mobility Comments: ambulatory without DME  Extremity/Trunk Assessment   Upper Extremity Assessment Upper Extremity Assessment: Overall WFL for tasks assessed    Lower Extremity Assessment Lower Extremity Assessment: LLE deficits/detail LLE Deficits / Details: generalized post-op weakness as anticipated on POD 0    Cervical / Trunk Assessment Cervical / Trunk  Assessment: Normal  Communication   Communication Communication: No apparent difficulties    Cognition Arousal: Alert Behavior During Therapy: WFL for tasks assessed/performed   PT - Cognitive impairments: No apparent impairments                         Following commands: Intact       Cueing Cueing Techniques: Verbal cues     General Comments General comments (skin integrity, edema, etc.): VSS on RA    Exercises Other Exercises Other Exercises: PT provides education on surgical hip exercise packet   Assessment/Plan    PT Assessment Patient needs continued PT services  PT Problem List Decreased strength;Decreased activity tolerance;Decreased mobility;Decreased balance;Decreased knowledge of use of DME;Pain       PT Treatment Interventions DME instruction;Gait training;Stair training;Functional mobility training;Therapeutic activities;Therapeutic exercise;Balance training;Neuromuscular re-education;Patient/family education    PT Goals (Current goals can be found in the Care Plan section)  Acute Rehab PT Goals Patient Stated Goal: to return to independence PT Goal Formulation: With patient Time For Goal Achievement: 12/05/23 Potential to Achieve Goals: Good    Frequency 7X/week     Co-evaluation               AM-PAC PT 6 Clicks Mobility  Outcome Measure Help needed turning from your back to your side while in a flat bed without using bedrails?: A Little Help needed moving from lying on your back to sitting on the side of a flat bed without using bedrails?: A Little Help needed moving to and from a bed to a chair (including a wheelchair)?: A Little Help needed standing up from a chair using your arms (e.g., wheelchair or bedside chair)?: A Little Help needed to walk in hospital room?: A Little Help needed climbing 3-5 steps with a railing? : A Lot 6 Click Score: 17    End of Session Equipment Utilized During Treatment: Gait belt Activity  Tolerance: Patient tolerated treatment well Patient left: in bed;with call bell/phone within reach Nurse Communication: Mobility status PT Visit Diagnosis: Other abnormalities of gait and mobility (R26.89);Muscle weakness (generalized) (M62.81);Pain Pain - Right/Left: Left Pain - part of body: Hip    Time: 8471-8451 PT Time Calculation (min) (ACUTE ONLY): 20 min   Charges:   PT Evaluation $PT Eval Low Complexity: 1 Low   PT General Charges $$ ACUTE PT VISIT: 1 Visit         Bernardino JINNY Ruth, PT, DPT Acute Rehabilitation Office [phone]   Bernardino JINNY Ruth 12/01/2023, 3:56 PM

## 2023-12-01 NOTE — Interval H&P Note (Signed)
 History and Physical Interval Note: The patient understands that she is here today for a left total hip replacement to treat her significant left hip pain and avascular necrosis.  There has been no acute or interval change in her medical status.  The risks and benefits of surgery have been discussed in detail and informed consent has been obtained.  The left operative hip has been marked.  12/01/2023 7:11 AM  [name]  has presented today for surgery, with the diagnosis of avascular necrosis left hip.  The various methods of treatment have been discussed with the patient and family. After consideration of risks, benefits and other options for treatment, the patient has consented to  Procedure(s): ARTHROPLASTY, HIP, TOTAL, ANTERIOR APPROACH (Left) as a surgical intervention.  The patient's history has been reviewed, patient examined, no change in status, stable for surgery.  I have reviewed the patient's chart and labs.  Questions were answered to the patient's satisfaction.     [name]

## 2023-12-01 NOTE — Transfer of Care (Signed)
 Immediate Anesthesia Transfer of Care Note  Patient: [name]  Procedure(s) Performed: ARTHROPLASTY, RIGHT HIP, TOTAL, ANTERIOR APPROACH (Left: Hip)  Patient Location: PACU  Anesthesia Type:Spinal  Level of Consciousness: awake, alert , and patient cooperative  Airway & Oxygen Therapy: Patient Spontanous Breathing  Post-op Assessment: Report given to RN and Post -op Vital signs reviewed and stable  Post vital signs: Reviewed and stable  Last Vitals:  Vitals Value Taken Time  BP 99/60 12/01/23 09:18  Temp 36.3 C 12/01/23 09:16  Pulse 54 12/01/23 09:21  Resp 15 12/01/23 09:21  SpO2 92 % 12/01/23 09:21  Vitals shown include unfiled device data.  Last Pain:  Vitals:   12/01/23 0610  TempSrc:   PainSc: 5       Patients Stated Pain Goal: 3 (12/01/23 0610)  Complications: No notable events documented.

## 2023-12-01 NOTE — Anesthesia Postprocedure Evaluation (Signed)
 Anesthesia Post Note  Patient: [name]  Procedure(s) Performed: ARTHROPLASTY, RIGHT HIP, TOTAL, ANTERIOR APPROACH (Left: Hip)     Patient location during evaluation: PACU Anesthesia Type: Spinal Level of consciousness: oriented and awake and alert Pain management: pain level controlled Vital Signs Assessment: post-procedure vital signs reviewed and stable Respiratory status: spontaneous breathing, respiratory function stable and nonlabored ventilation Cardiovascular status: blood pressure returned to baseline and stable Postop Assessment: no headache, no backache, no apparent nausea or vomiting, spinal receding and patient able to bend at knees Anesthetic complications: no   No notable events documented.  Last Vitals:  Vitals:   12/01/23 1030 12/01/23 1045  BP: 92/61 100/64  Pulse: (!) 52 (!) 50  Resp: 11 13  Temp:    SpO2: 98% 93%    Last Pain:  Vitals:   12/01/23 1045  TempSrc:   PainSc: 0-No pain    LLE Motor Response: Purposeful movement (12/01/23 1045) LLE Sensation: Tingling (12/01/23 1045) RLE Motor Response: Purposeful movement (12/01/23 1045) RLE Sensation: Tingling (12/01/23 1045) L Sensory Level: L1-Inguinal (groin) region (12/01/23 1045) R Sensory Level: L1-Inguinal (groin) region (12/01/23 1045)  Aila Terra A.

## 2023-12-02 ENCOUNTER — Encounter (HOSPITAL_COMMUNITY): Payer: Self-pay | Admitting: Orthopaedic Surgery

## 2023-12-02 DIAGNOSIS — I130 Hypertensive heart and chronic kidney disease with heart failure and stage 1 through stage 4 chronic kidney disease, or unspecified chronic kidney disease: Secondary | ICD-10-CM | POA: Diagnosis present

## 2023-12-02 DIAGNOSIS — I2510 Atherosclerotic heart disease of native coronary artery without angina pectoris: Secondary | ICD-10-CM | POA: Diagnosis present

## 2023-12-02 DIAGNOSIS — I5032 Chronic diastolic (congestive) heart failure: Secondary | ICD-10-CM | POA: Diagnosis present

## 2023-12-02 DIAGNOSIS — Z7982 Long term (current) use of aspirin: Secondary | ICD-10-CM | POA: Diagnosis not present

## 2023-12-02 DIAGNOSIS — Z79890 Hormone replacement therapy (postmenopausal): Secondary | ICD-10-CM | POA: Diagnosis not present

## 2023-12-02 DIAGNOSIS — Z7985 Long-term (current) use of injectable non-insulin antidiabetic drugs: Secondary | ICD-10-CM | POA: Diagnosis not present

## 2023-12-02 DIAGNOSIS — I252 Old myocardial infarction: Secondary | ICD-10-CM | POA: Diagnosis not present

## 2023-12-02 DIAGNOSIS — Z8249 Family history of ischemic heart disease and other diseases of the circulatory system: Secondary | ICD-10-CM | POA: Diagnosis not present

## 2023-12-02 DIAGNOSIS — Z83438 Family history of other disorder of lipoprotein metabolism and other lipidemia: Secondary | ICD-10-CM | POA: Diagnosis not present

## 2023-12-02 DIAGNOSIS — M87052 Idiopathic aseptic necrosis of left femur: Secondary | ICD-10-CM | POA: Diagnosis present

## 2023-12-02 DIAGNOSIS — M329 Systemic lupus erythematosus, unspecified: Secondary | ICD-10-CM | POA: Diagnosis present

## 2023-12-02 DIAGNOSIS — E039 Hypothyroidism, unspecified: Secondary | ICD-10-CM | POA: Diagnosis present

## 2023-12-02 DIAGNOSIS — Z9081 Acquired absence of spleen: Secondary | ICD-10-CM | POA: Diagnosis not present

## 2023-12-02 DIAGNOSIS — Z79899 Other long term (current) drug therapy: Secondary | ICD-10-CM | POA: Diagnosis not present

## 2023-12-02 DIAGNOSIS — Z9049 Acquired absence of other specified parts of digestive tract: Secondary | ICD-10-CM | POA: Diagnosis not present

## 2023-12-02 DIAGNOSIS — Z87891 Personal history of nicotine dependence: Secondary | ICD-10-CM | POA: Diagnosis not present

## 2023-12-02 DIAGNOSIS — E7849 Other hyperlipidemia: Secondary | ICD-10-CM | POA: Diagnosis present

## 2023-12-02 DIAGNOSIS — N1831 Chronic kidney disease, stage 3a: Secondary | ICD-10-CM | POA: Diagnosis present

## 2023-12-02 DIAGNOSIS — M879 Osteonecrosis, unspecified: Secondary | ICD-10-CM | POA: Diagnosis present

## 2023-12-02 DIAGNOSIS — Z803 Family history of malignant neoplasm of breast: Secondary | ICD-10-CM | POA: Diagnosis not present

## 2023-12-02 DIAGNOSIS — K219 Gastro-esophageal reflux disease without esophagitis: Secondary | ICD-10-CM | POA: Diagnosis present

## 2023-12-02 LAB — BASIC METABOLIC PANEL WITH GFR
Anion gap: 9 (ref 5–15)
BUN: 15 mg/dL (ref 8–23)
CO2: 25 mmol/L (ref 22–32)
Calcium: 8.5 mg/dL — ABNORMAL LOW (ref 8.9–10.3)
Chloride: 106 mmol/L (ref 98–111)
Creatinine, Ser: 1.06 mg/dL — ABNORMAL HIGH (ref 0.44–1.00)
GFR, Estimated: 58 mL/min — ABNORMAL LOW (ref >60)
Glucose, Bld: 113 mg/dL — ABNORMAL HIGH (ref 70–99)
Potassium: 4 mmol/L (ref 3.5–5.1)
Sodium: 140 mmol/L (ref 135–145)

## 2023-12-02 LAB — CBC
HCT: 33.4 % — ABNORMAL LOW (ref 36.0–46.0)
Hemoglobin: 11.3 g/dL — ABNORMAL LOW (ref 12.0–15.0)
MCH: 32.8 pg (ref 26.0–34.0)
MCHC: 33.8 g/dL (ref 30.0–36.0)
MCV: 96.8 fL (ref 80.0–100.0)
Platelets: 180 K/uL (ref 150–400)
RBC: 3.45 MIL/uL — ABNORMAL LOW (ref 3.87–5.11)
RDW: 17.2 % — ABNORMAL HIGH (ref 11.5–15.5)
WBC: 6.7 K/uL (ref 4.0–10.5)
nRBC: 0 % (ref 0.0–0.2)

## 2023-12-02 MED ORDER — SODIUM CHLORIDE 0.9 % IV SOLN
500.0000 mL | Freq: Once | INTRAVENOUS | Status: AC
  Administered 2023-12-02: 500 mL via INTRAVENOUS

## 2023-12-02 MED ORDER — SODIUM CHLORIDE 0.9 % IV BOLUS
500.0000 mL | Freq: Once | INTRAVENOUS | Status: AC
  Administered 2023-12-02: 500 mL via INTRAVENOUS

## 2023-12-02 NOTE — Progress Notes (Signed)
 Physical Therapy Treatment Patient Details Name: [name] MRN: [id number] DOB: [birth year] Today's Date: 12/02/2023   History of Present Illness 68 y.o. female presents to [facility] hospital on 12/01/2023 for elective L THA. PMH: R THA, obesity, chronic heart failure, CKD 3a, CAD s/p CABG.    PT Comments  Pt greeted seated in recliner chair, pleasant and agreeable to attempt limited PT session. She engaged in sit<>stand using RW with CGA. Deferred further mobility d/t symptomatic orthostatic hypotension. Quickly transitioned pt back to recliner chair given significant drop in SBP and DBP. Pt's BP recovered to  67/47 (55) seated and 76/51 (60) with feet elevated. MD and RN notified of pt's vital sign response. Will continue to follow acutely and advance appropriately.    12/02/23 1300  Vital Signs  BP Location Left Arm  BP Method Automatic  Patient Position (if appropriate) Orthostatic Vitals  Orthostatic Sitting  BP- Sitting (!) 88/53  Orthostatic Standing at 0 minutes  BP- Standing at 0 minutes (!) 55/26      If plan is discharge home, recommend the following: A little help with bathing/dressing/bathroom;Assistance with cooking/housework;Assist for transportation;Help with stairs or ramp for entrance   Can travel by private vehicle        Equipment Recommendations  None recommended by PT    Recommendations for Other Services       Precautions / Restrictions Precautions Precautions: Fall Recall of Precautions/Restrictions: Intact Precaution/Restrictions Comments: direct anterior approach, no precautions Restrictions Weight Bearing Restrictions Per Provider Order: Yes LLE Weight Bearing Per Provider Order: Weight bearing as tolerated     Mobility  Bed Mobility               General bed mobility comments: Not assessed. Pt greeted seated in recliner chair and returned there at end of session.    Transfers Overall transfer level: Needs assistance Equipment used:  Rolling walker (2 wheels) Transfers: Sit to/from Stand Sit to Stand: Contact guard assist           General transfer comment: Pt stood from recliner chair. Cued proper hand placement using RW. Powered up with CGA. Pt slowly transitioned RUE from arm rest to RW. Increased time to achieve upright and stabilize. Fair eccentric control with sitting.    Ambulation/Gait               General Gait Details: Deferred d/t symptomatic orthostatic hypotension   Stairs             Wheelchair Mobility     Tilt Bed    Modified Rankin (Stroke Patients Only)       Balance Overall balance assessment: Needs assistance Sitting-balance support: No upper extremity supported, Feet supported Sitting balance-Leahy Scale: Good     Standing balance support: Bilateral upper extremity supported, During functional activity, Reliant on assistive device for balance Standing balance-Leahy Scale: Poor                              Communication Communication Communication: No apparent difficulties  Cognition Arousal: Alert Behavior During Therapy: WFL for tasks assessed/performed   PT - Cognitive impairments: No apparent impairments                         Following commands: Intact      Cueing Cueing Techniques: Verbal cues  Exercises      General Comments General comments (skin integrity, edema, etc.): Pt limited  by symptomatic orthostatic hypotension. Assessed BP: seated in recliner chair with legs elevated 88/53 (65), standing 55/26 (35), seated with feet down 67/47 (55), and seated with legs elevated 76/51 (60).      Pertinent Vitals/Pain Pain Assessment Pain Assessment: Faces Faces Pain Scale: Hurts even more Pain Location: L hip Pain Descriptors / Indicators: Grimacing, Discomfort, Aching, Sore Pain Intervention(s): Monitored during session, Limited activity within patient's tolerance, Repositioned, Ice applied    Home Living                           Prior Function            PT Goals (current goals can now be found in the care plan section) Acute Rehab PT Goals Patient Stated Goal: Get my BP under control so I can return home Progress towards PT goals: Not progressing toward goals - comment (limited by symptomatic orthostatic hypotension)    Frequency    7X/week      PT Plan      Co-evaluation              AM-PAC PT 6 Clicks Mobility   Outcome Measure  Help needed turning from your back to your side while in a flat bed without using bedrails?: A Little Help needed moving from lying on your back to sitting on the side of a flat bed without using bedrails?: A Little Help needed moving to and from a bed to a chair (including a wheelchair)?: A Little Help needed standing up from a chair using your arms (e.g., wheelchair or bedside chair)?: A Little Help needed to walk in hospital room?: A Little Help needed climbing 3-5 steps with a railing? : A Lot 6 Click Score: 17    End of Session Equipment Utilized During Treatment: Gait belt Activity Tolerance: Treatment limited secondary to medical complications (Comment) (symptomatic orthostatic hypotension) Patient left: in chair;with call bell/phone within reach;with chair alarm set Nurse Communication: Mobility status;Other (comment) (VS Response, symptomatic orthostatic hypotension) PT Visit Diagnosis: Other abnormalities of gait and mobility (R26.89);Muscle weakness (generalized) (M62.81);Pain Pain - Right/Left: Left Pain - part of body: Hip     Time: 1359-1416 PT Time Calculation (min) (ACUTE ONLY): 17 min  Charges:    $Therapeutic Activity: 8-22 mins PT General Charges $$ ACUTE PT VISIT: 1 Visit                     Randall SAUNDERS, PT, DPT Acute Rehabilitation Services Office: [phone] Secure Chat Preferred  [name] 12/02/2023, 2:30 PM

## 2023-12-02 NOTE — Progress Notes (Signed)
 PT Cancellation Note  Patient Details Name: [name] MRN: [id number] DOB: Aug 07, 1955   Cancelled Treatment:    Reason Eval/Treat Not Completed: Fatigue/lethargy limiting ability to participate;Patient declined, no reason specified (Pt greeted supine in bed. Discussed advancing gait and practicing stairs during PT session. Pt reported I don't need to do that. I have a chair lift for my stairs and this is my second surgery, I know how to complete stairs. Reviewed stair education with pt stating up with the good and down with the bad.) Pt declined to mobilize at this time reporting she is sleepy and requested PT come back this afternoon. Will follow-up as schedule permits.  Randall SAUNDERS, PT, DPT Acute Rehabilitation Services Office: [phone] Secure Chat Preferred  [name] 12/02/2023, 8:33 AM

## 2023-12-02 NOTE — Plan of Care (Signed)

## 2023-12-02 NOTE — Progress Notes (Signed)
 Subjective: 1 Day Post-Op Procedure(s) (LRB): ARTHROPLASTY, LEFT HIP, TOTAL, ANTERIOR APPROACH (Left) Patient reports pain as moderate.    Objective: Vital signs in last 24 hours: Temp:  [97.4 F (36.3 C)-98.8 F (37.1 C)] 98.3 F (36.8 C) (09/10 0630) Pulse Rate:  [49-70] 70 (09/10 0630) Resp:  [11-20] 18 (09/10 0630) BP: (67-126)/(50-93) 105/57 (09/10 0630) SpO2:  [92 %-99 %] 99 % (09/10 0630)  Intake/Output from previous day: 09/09 0701 - 09/10 0700 In: 1050 [I.V.:600; IV Piggyback:450] Out: 3000 [Urine:2900; Blood:100] Intake/Output this shift: No intake/output data recorded.  Recent Labs    12/02/23 0429  HGB 11.3*   Recent Labs    12/02/23 0429  WBC 6.7  RBC 3.45*  HCT 33.4*  PLT 180   Recent Labs    12/02/23 0429  NA 140  K 4.0  CL 106  CO2 25  BUN 15  CREATININE 1.06*  GLUCOSE 113*  CALCIUM  8.5*   No results for input(s): LABPT, INR in the last 72 hours.  Sensation intact distally Intact pulses distally Dorsiflexion/Plantar flexion intact Incision: scant drainage   Assessment/Plan: 1 Day Post-Op Procedure(s) (LRB): ARTHROPLASTY, LEFT HIP, TOTAL, ANTERIOR APPROACH (Left) Up with therapy      [name] 12/02/2023, 7:09 AM

## 2023-12-02 NOTE — Care Management Obs Status (Signed)
 MEDICARE OBSERVATION STATUS NOTIFICATION   Patient Details  Name: [name] MRN: [id number] Date of Birth: 1955/03/29   Medicare Observation Status Notification Given:  Yes    Bridget Cordella Simmonds, LCSW 12/02/2023, 2:51 PM

## 2023-12-02 NOTE — Discharge Instructions (Signed)

## 2023-12-03 MED ORDER — ASPIRIN 81 MG PO CHEW: 81.0000 mg | CHEWABLE_TABLET | Freq: Two times a day (BID) | ORAL | 0 refills | Status: AC

## 2023-12-03 MED ORDER — OXYCODONE HCL 5 MG PO TABS: 5.0000 mg | ORAL_TABLET | Freq: Four times a day (QID) | ORAL | 0 refills | Status: DC | PRN

## 2023-12-03 MED ORDER — CYCLOBENZAPRINE HCL 5 MG PO TABS: 5.0000 mg | ORAL_TABLET | Freq: Three times a day (TID) | ORAL | 0 refills | Status: AC | PRN

## 2023-12-03 NOTE — Plan of Care (Signed)

## 2023-12-03 NOTE — TOC Transition Note (Signed)
 Transition of Care [facility]) - Discharge Note   Patient Details  Name: Alean Kromer MRN: [id number] Date of Birth: July 04, 1955  Transition of Care [facility]) CM/SW Contact:  Rosalva Jon Bloch, RN Phone Number: 12/03/2023, 1:58 PM   Clinical Narrative:    Patient will DC to: home Anticipated DC date: 12/03/2023 Family notified: yes Transport by: car         - S/P L THA , 12/01/2023 Per MD patient ready for DC today . RN, patient, patient's family, and [facility] notified of DC. Pt without DME need states has DME @ home. Post hospital f/u noted on AVS. Pt without RX meds and transportation issues.  RNCM will sign off for now as intervention is no longer needed. Please consult us  again if new needs arise.    Final next level of care: Home w Home Health Services Barriers to Discharge: No Barriers Identified   Patient Goals and CMS Choice            Discharge Placement                       Discharge Plan and Services Additional resources added to the After Visit Summary for                            [facility] Arranged: PT HH Agency: [facility] Date [facility] Agency Contacted: 12/03/23 Time HH Agency Contacted: [phone]    Social Drivers of Health (SDOH) Interventions SDOH Screenings   Food Insecurity: No Food Insecurity (12/01/2023)  Housing: Low Risk  (12/01/2023)  Transportation Needs: No Transportation Needs (12/01/2023)  Utilities: Not At Risk (12/01/2023)  Alcohol Screen: Low Risk  (06/16/2023)  Depression (PHQ2-9): Low Risk  (06/16/2023)  Financial Resource Strain: Low Risk  (06/16/2023)  Physical Activity: Sufficiently Active (06/16/2023)  Social Connections: Moderately Integrated (12/01/2023)  Stress: No Stress Concern Present (06/16/2023)  Tobacco Use: Medium Risk (12/01/2023)  Health Literacy: Adequate Health Literacy (06/16/2023)     Readmission Risk Interventions     No data to display

## 2023-12-03 NOTE — Progress Notes (Signed)
 Physical Therapy Treatment Patient Details Name: [name] MRN: [id number] DOB: [birth year] Today's Date: 12/03/2023   History of Present Illness 68 y.o. female presents to [facility] hospital on 12/01/2023 for elective L THA. PMH: R THA, obesity, chronic heart failure, CKD 3a, CAD s/p CABG.    PT Comments  Pt's BP was stable throughout session. She denied dizziness or lightheadedness with mobility. Pt ambulated a household distance using RW with supervision. Gait distance self-limited by pt d/t fatigue. Encouraged her to frequently mobilize with staff assistance. Pt declined stair training reporting she plans to use the chair lift inside her home to navigate the 17 steps. Reviewed proper technique with pt verbalizing to ascend with RLE and descend with LLE. Will continue to follow acutely and advance appropriately.     If plan is discharge home, recommend the following: A little help with bathing/dressing/bathroom;Assistance with cooking/housework;Assist for transportation;Help with stairs or ramp for entrance   Can travel by private vehicle        Equipment Recommendations  None recommended by PT    Recommendations for Other Services       Precautions / Restrictions Precautions Precautions: Fall Recall of Precautions/Restrictions: Intact Precaution/Restrictions Comments: direct anterior approach, no precautions Restrictions Weight Bearing Restrictions Per Provider Order: Yes LLE Weight Bearing Per Provider Order: Weight bearing as tolerated     Mobility  Bed Mobility               General bed mobility comments: Not assessed. Pt greeted seated in recliner chair and returned there at end of session.    Transfers Overall transfer level: Needs assistance Equipment used: Rolling walker (2 wheels) Transfers: Sit to/from Stand Sit to Stand: Supervision           General transfer comment: Pt demonstrated proper hand placement using RW. Powered up without physical  assist. Good eccentric control.    Ambulation/Gait Ambulation/Gait assistance: Supervision Gait Distance (Feet): 60 Feet Assistive device: Rolling walker (2 wheels) Gait Pattern/deviations: Step-through pattern, Decreased stride length, Decreased stance time - left, Trunk flexed Gait velocity: reduced Gait velocity interpretation: <1.8 ft/sec, indicate of risk for recurrent falls   General Gait Details: Pt ambulated with short, slow steps. She demonstrated a fwd lean. Cued upright posture and closer proximity to RW. Instructed pt to increase WBing thru BUE support on RW to offload LLE. Distance self-limited by pt. She declined further mobility. Encouraged frequent mobilization with nursing staff.   Stairs Stairs:  (Pt declined stair training. She has 17 steps within home and has a chair lift. Pt has previously not utilized the lift chair, but intends to use it currently. Reviewed proper stair technique with pt verbalizing to ascend with RLE and descend with LLE.)           Wheelchair Mobility     Tilt Bed    Modified Rankin (Stroke Patients Only)       Balance Overall balance assessment: Needs assistance Sitting-balance support: No upper extremity supported, Feet supported Sitting balance-Leahy Scale: Good     Standing balance support: Bilateral upper extremity supported, During functional activity, Reliant on assistive device for balance Standing balance-Leahy Scale: Poor                              Communication Communication Communication: No apparent difficulties  Cognition Arousal: Alert Behavior During Therapy: WFL for tasks assessed/performed   PT - Cognitive impairments: No apparent impairments  Following commands: Intact      Cueing Cueing Techniques: Verbal cues  Exercises      General Comments General comments (skin integrity, edema, etc.): BP: seated 110/70 (81), standing 121/67 (77), post-gait seated  118/63 (80). Pt denied dizziness/lightheadedness.      Pertinent Vitals/Pain Pain Assessment Pain Assessment: Faces Faces Pain Scale: Hurts little more Pain Location: L hip Pain Descriptors / Indicators: Discomfort, Aching, Sore Pain Intervention(s): Monitored during session, Limited activity within patient's tolerance, Repositioned, Ice applied    Home Living                          Prior Function            PT Goals (current goals can now be found in the care plan section) Acute Rehab PT Goals Patient Stated Goal: Return Home and regain my independence. Progress towards PT goals: Progressing toward goals    Frequency    7X/week      PT Plan      Co-evaluation              AM-PAC PT 6 Clicks Mobility   Outcome Measure  Help needed turning from your back to your side while in a flat bed without using bedrails?: A Little Help needed moving from lying on your back to sitting on the side of a flat bed without using bedrails?: A Little Help needed moving to and from a bed to a chair (including a wheelchair)?: A Little Help needed standing up from a chair using your arms (e.g., wheelchair or bedside chair)?: A Little Help needed to walk in hospital room?: A Little Help needed climbing 3-5 steps with a railing? : A Lot 6 Click Score: 17    End of Session Equipment Utilized During Treatment: Gait belt Activity Tolerance: Patient tolerated treatment well;Patient limited by fatigue Patient left: in chair;with call bell/phone within reach Nurse Communication: Mobility status PT Visit Diagnosis: Other abnormalities of gait and mobility (R26.89);Muscle weakness (generalized) (M62.81);Pain Pain - Right/Left: Left Pain - part of body: Hip     Time: 9050-8992 PT Time Calculation (min) (ACUTE ONLY): 18 min  Charges:    $Gait Training: 8-22 mins PT General Charges $$ ACUTE PT VISIT: 1 Visit                     Randall SAUNDERS, PT, DPT Acute Rehabilitation  Services Office: [phone] Secure Chat Preferred  [name] 12/03/2023, 10:37 AM

## 2023-12-03 NOTE — Progress Notes (Signed)
 Patient ID: [name], female   DOB: [birth year], 68 y.o.   MRN: [id number] The patient is resting in a bedside chair this morning.  She is awake and alert and we have encouraged her to mobilize is much as possible this morning and this afternoon for discharge to home later today.  Her vital signs are stable.  Her left operative hip is stable with her dressing being clean and dry.  Hopefully she will do well with therapy today.  We will put in a discharge order for later this afternoon.

## 2023-12-03 NOTE — Progress Notes (Signed)
 Physical Therapy Treatment Patient Details Name: [name] MRN: [id number] DOB: 28-Dec-1955 Today's Date: 12/03/2023   History of Present Illness 68 y.o. female presents to [facility] hospital on 12/01/2023 for elective L THA. PMH: R THA, obesity, chronic heart failure, CKD 3a, CAD s/p CABG.    PT Comments  Pt is making steady progress towards her acute PT goals demonstrated by advanced functional mobility with decreased physical assistance. Overall, pt is supervision to modI using RW. She increased her gait distance, ambulating ~136ft with a step-through gait pattern. Reviewed L THA HEP. Instructed pt to complete 10 reps of each exercise 2-3x/day. Encouraged frequent mobilization. Patient feels ready and safe for discharge Home with HHPT. I have answered all her questions related to mobility.      If plan is discharge home, recommend the following: A little help with bathing/dressing/bathroom;Assistance with cooking/housework;Assist for transportation;Help with stairs or ramp for entrance   Can travel by private vehicle        Equipment Recommendations  None recommended by PT    Recommendations for Other Services       Precautions / Restrictions Precautions Precautions: Fall Recall of Precautions/Restrictions: Intact Precaution/Restrictions Comments: direct anterior approach, no precautions Restrictions Weight Bearing Restrictions Per Provider Order: Yes LLE Weight Bearing Per Provider Order: Weight bearing as tolerated     Mobility  Bed Mobility               General bed mobility comments: Not assessed. Pt greeted seated in recliner chair and returned there at end of session.    Transfers Overall transfer level: Modified independent Equipment used: Rolling walker (2 wheels) Transfers: Sit to/from Stand Sit to Stand: Supervision           General transfer comment: Pt performed sit<>stand and bed<>bathroom/chair with modI. Good eccentric control with sitting.     Ambulation/Gait Ambulation/Gait assistance: Supervision Gait Distance (Feet): 125 Feet Assistive device: Rolling walker (2 wheels) Gait Pattern/deviations: Step-through pattern, Decreased stride length, Decreased stance time - left, Trunk flexed Gait velocity: reduced Gait velocity interpretation: <1.8 ft/sec, indicate of risk for recurrent falls   General Gait Details: Pt initially with a step-to gait pattern quickly advanced to step-through. She has a tendency to allow RW to be too far infront of her. VC to improve posture and proximity to AD. Pt navigated room/hallway well. No LOB. Distance limited d/t fatigue.   Stairs Stairs:  (Pt declined stair training. She has 17 steps within home and has a chair lift. Pt has previously not utilized the lift chair, but intends to use it currently. Reviewed proper stair technique with pt verbalizing to ascend with RLE and descend with LLE.)           Wheelchair Mobility     Tilt Bed    Modified Rankin (Stroke Patients Only)       Balance Overall balance assessment: Needs assistance Sitting-balance support: No upper extremity supported, Feet supported Sitting balance-Leahy Scale: Good     Standing balance support: Bilateral upper extremity supported, During functional activity, Reliant on assistive device for balance Standing balance-Leahy Scale: Poor                              Communication Communication Communication: No apparent difficulties  Cognition Arousal: Alert Behavior During Therapy: WFL for tasks assessed/performed   PT - Cognitive impairments: No apparent impairments  Following commands: Intact      Cueing Cueing Techniques: Verbal cues  Exercises Total Joint Exercises Marching in Standing: Standing, Both, AROM, 15 reps    General Comments General comments (skin integrity, edema, etc.): VSS on RA      Pertinent Vitals/Pain Pain Assessment Pain  Assessment: Faces Faces Pain Scale: Hurts little more Pain Location: L hip Pain Descriptors / Indicators: Discomfort, Aching, Sore Pain Intervention(s): Monitored during session, Limited activity within patient's tolerance, Repositioned    Home Living                          Prior Function            PT Goals (current goals can now be found in the care plan section) Acute Rehab PT Goals Patient Stated Goal: Return Home and regain my independence. Progress towards PT goals: Progressing toward goals    Frequency    7X/week      PT Plan      Co-evaluation              AM-PAC PT 6 Clicks Mobility   Outcome Measure  Help needed turning from your back to your side while in a flat bed without using bedrails?: A Little Help needed moving from lying on your back to sitting on the side of a flat bed without using bedrails?: A Little Help needed moving to and from a bed to a chair (including a wheelchair)?: None Help needed standing up from a chair using your arms (e.g., wheelchair or bedside chair)?: None Help needed to walk in hospital room?: A Little Help needed climbing 3-5 steps with a railing? : A Lot 6 Click Score: 19    End of Session Equipment Utilized During Treatment: Gait belt Activity Tolerance: Patient tolerated treatment well;Patient limited by fatigue Patient left: in chair;with call bell/phone within reach Nurse Communication: Mobility status PT Visit Diagnosis: Other abnormalities of gait and mobility (R26.89);Muscle weakness (generalized) (M62.81);Pain Pain - Right/Left: Left Pain - part of body: Hip     Time: 1450-1503 PT Time Calculation (min) (ACUTE ONLY): 13 min  Charges:    $Gait Training: 8-22 mins PT General Charges $$ ACUTE PT VISIT: 1 Visit                     Randall SAUNDERS, PT, DPT Acute Rehabilitation Services Office: [phone] Secure Chat Preferred  Delon CHRISTELLA Callander 12/03/2023, 4:33 PM

## 2023-12-04 ENCOUNTER — Telehealth: Payer: Self-pay

## 2023-12-04 NOTE — Discharge Summary (Signed)
 Patient ID: [name] MRN: [id number] DOB/AGE: 08-17-1955 68 y.o.  Admit date: 12/01/2023 Discharge date: 12/03/23  Admission Diagnoses:  Principal Problem:   Avascular necrosis of bone of left hip [facility]) Active Problems:   Status post total replacement of left hip   Discharge Diagnoses:  Same  Past Medical History:  Diagnosis Date   Arthritis    Back pain    Bilateral swelling of feet and ankles    no current problems per pt 07/30/23   CAD (coronary artery disease)    a. remote stents/angioplasty. b. s/p CABG 01/2017 with mitral valve annuloplasty.   Chest pain    Constipation    Diastolic heart failure (HCC)    Esophageal reflux    Gallbladder problem    GERD (gastroesophageal reflux disease)    History of MI (myocardial infarction)    Hyperlipidemia    Hypertension    Hypothyroidism    Immune thrombocytopenic purpura (HCC)    Joint pain    Lupus erythematosus    in remission since 2013   Mitral valve insufficiency and aortic valve insufficiency    Myocardial infarction (HCC)    x 2   Pneumonia    x several   PONV (postoperative nausea and vomiting)    Postoperative atrial fibrillation (HCC)    Pre-diabetes    Renal disease    stage 3   Severe mitral regurgitation    a. s/p MV annuloplasty 01/2017 at time of CABG.   Shortness of breath on exertion    no current problems - lost weight, stop smoking and hgb wnl in 02/2023   Sleep apnea    does not use CPAP - patient states mild, lost weight   Status post mitral valve annuloplasty    Unspecified disease of pericardium     Surgeries: Procedure(s): ARTHROPLASTY, LEFT HIP, TOTAL, ANTERIOR APPROACH on 12/01/2023   Consultants:   Discharged Condition: Improved  Hospital Course: [name] is an 68 y.o. female who was admitted 12/01/2023 for operative treatment ofAvascular necrosis of bone of left hip (HCC). Patient has severe unremitting pain that affects sleep, daily activities, and work/hobbies.  After pre-op clearance the patient was taken to the operating room on 12/01/2023 and underwent  Procedure(s): ARTHROPLASTY, LEFT HIP, TOTAL, ANTERIOR APPROACH.    Patient was given perioperative antibiotics:  Anti-infectives (From admission, onward)    Start     Dose/Rate Route Frequency Ordered Stop   12/01/23 2200  hydroxychloroquine  (PLAQUENIL ) tablet 200 mg  Status:  Discontinued        200 mg Oral Daily 12/01/23 2057 12/03/23 2159   12/01/23 1400  ceFAZolin  (ANCEF ) IVPB 2g/100 mL premix        2 g 200 mL/hr over 30 Minutes Intravenous Every 6 hours 12/01/23 1330 12/01/23 2025   12/01/23 0600  ceFAZolin  (ANCEF ) IVPB 2g/100 mL premix        2 g 200 mL/hr over 30 Minutes Intravenous On call to O.R. 12/01/23 0545 12/01/23 0815        Patient was given sequential compression devices, early ambulation, and chemoprophylaxis to prevent DVT.  Inpatient Morphine  Milligram Equivalents Per Day 9/9 - 9/11   Values displayed are in units of MME/Day    Order Start / End Date 9/9 9/10 Yesterday    oxyCODONE  (Oxy IR/ROXICODONE ) immediate release tablet 5 mg 9/9 - 9/9 0 of Unknown -- --    oxyCODONE  (ROXICODONE ) 5 MG/5ML solution 5 mg 9/9 - 9/9 0 of Unknown -- --  Group total: 0 of Unknown      fentaNYL  (SUBLIMAZE ) injection 25-50 mcg 9/9 - 9/9 0 of 45-90 -- --    HYDROmorphone  (DILAUDID ) injection 0.5-1 mg 9/9 - 9/11 0 of 30-60 0 of 70-140 0 of 50-100    oxyCODONE  (Oxy IR/ROXICODONE ) immediate release tablet 5-10 mg 9/9 - 9/11 7.5 of 22.5-45 22.5 of 52.5-105 0 of 37.5-75    oxyCODONE  (Oxy IR/ROXICODONE ) immediate release tablet 10-15 mg 9/9 - 9/11 15 of 45-67.5 15 of 105-157.5 0 of 75-112.5    Daily Totals  22.5 of Unknown (at least 142.5-262.5) 37.5 of 227.5-402.5 0 of 162.5-287.5    Calculation Errors     Order Type Date Details   oxyCODONE  (Oxy IR/ROXICODONE ) immediate release tablet 5 mg Ordered Dose -- Insufficient frequency information   oxyCODONE  (ROXICODONE ) 5 MG/5ML solution 5 mg  Ordered Dose -- Insufficient frequency information            Patient benefited maximally from hospital stay and there were no complications.    Recent vital signs: Patient Vitals for the past 24 hrs:  BP Temp Pulse Resp SpO2  12/03/23 1159 (!) 110/57 98.1 F (36.7 C) 73 18 100 %  12/03/23 0832 129/65 -- 82 16 99 %     Recent laboratory studies:  Recent Labs    12/02/23 0429  WBC 6.7  HGB 11.3*  HCT 33.4*  PLT 180  NA 140  K 4.0  CL 106  CO2 25  BUN 15  CREATININE 1.06*  GLUCOSE 113*  CALCIUM  8.5*     Discharge Medications:   Allergies as of 12/03/2023       Reactions   Amlodipine  Swelling   Leg edema   Cellcept  [mycophenolate ] Nausea Only        Medication List     STOP taking these medications    oxycodone  5 MG capsule Commonly known as: OXY-IR Replaced by: oxyCODONE  5 MG immediate release tablet       TAKE these medications    albuterol  108 (90 Base) MCG/ACT inhaler Commonly known as: VENTOLIN  HFA Inhale 2 puffs into the lungs every 6 (six) hours as needed for wheezing or shortness of breath.   aspirin  81 MG chewable tablet Chew 1 tablet (81 mg total) by mouth 2 (two) times daily. What changed: See the new instructions.   azaTHIOprine  50 MG tablet Commonly known as: IMURAN  TAKE 1 TABLET BY MOUTH IN THE MORNING AND IN THE EVENING   cyclobenzaprine  5 MG tablet Commonly known as: FLEXERIL  Take 1 tablet (5 mg total) by mouth 3 (three) times daily as needed for muscle spasms. What changed: reasons to take this   dapagliflozin  propanediol 10 MG Tabs tablet Commonly known as: Farxiga  Take 1 tablet (10 mg total) by mouth daily.   fluticasone  50 MCG/ACT nasal spray Commonly known as: FLONASE  SPRAY 2 SPRAYS INTO EACH NOSTRIL EVERY DAY   furosemide  20 MG tablet Commonly known as: LASIX  TAKE 1 TABLET BY MOUTH EVERY DAY   HYDROcodone -acetaminophen  5-325 MG tablet Commonly known as: NORCO/VICODIN Take 1-2 tablets by mouth 3 (three) times  daily as needed for moderate pain (pain score 4-6). What changed:  how much to take when to take this   hydroxychloroquine  200 MG tablet Commonly known as: PLAQUENIL  Take 200 mg by mouth 2 (two) times daily.   isosorbide  mononitrate 30 MG 24 hr tablet Commonly known as: IMDUR  Take 1 tablet (30 mg total) by mouth daily.   levothyroxine  75 MCG tablet Commonly  known as: SYNTHROID  Take 1 tablet (75 mcg total) by mouth daily before breakfast.   metoprolol  succinate 50 MG 24 hr tablet Commonly known as: TOPROL -XL Take 1 tablet (50 mg total) by mouth daily.   nitroGLYCERIN  0.4 MG SL tablet Commonly known as: NITROSTAT  Place 1 tablet (0.4 mg total) under the tongue every 5 (five) minutes as needed for chest pain.   oxyCODONE  5 MG immediate release tablet Commonly known as: Oxy IR/ROXICODONE  Take 1-2 tablets (5-10 mg total) by mouth every 6 (six) hours as needed for moderate pain (pain score 4-6) (pain score 4-6). Replaces: oxycodone  5 MG capsule   pantoprazole  40 MG tablet Commonly known as: PROTONIX  Take 1 tablet (40 mg total) by mouth daily.   polyethylene glycol powder 17 GM/SCOOP powder Commonly known as: GLYCOLAX /MIRALAX  17 gram po BID prn constipation What changed:  how much to take how to take this when to take this reasons to take this additional instructions   potassium chloride  SA 20 MEQ tablet Commonly known as: KLOR-CON  M Take 1 tablet (20 mEq total) by mouth daily.   rosuvastatin  20 MG tablet Commonly known as: CRESTOR  Take 1 tablet (20 mg total) by mouth daily.   tirzepatide  7.5 MG/0.5ML Pen Commonly known as: MOUNJARO  Inject 7.5 mg into the skin once a week. What changed: additional instructions   Vitamin D  (Ergocalciferol ) 1.25 MG (50000 UNIT) Caps capsule Commonly known as: DRISDOL  Take 1 capsule (50,000 Units total) by mouth every 7 (seven) days. What changed:  when to take this additional instructions        Diagnostic Studies: DG Pelvis  Portable Result Date: 12/01/2023 CLINICAL DATA:  Status post left hip replacement. EXAM: DG PORTABLE PELVIS COMPARISON:  09/01/2023. FINDINGS: Status post left total hip arthroplasty with normal alignment. No acute fracture or dislocation. Redemonstrated right total hip arthroplasty. Expected postoperative soft tissue swelling and soft tissue air along the left hip. IMPRESSION: Status post left total hip arthroplasty with normal alignment. No acute complication. Electronically Signed   By: Harrietta Sherry M.D.   On: 12/01/2023 12:30   DG HIP UNILAT WITH PELVIS 1V LEFT Result Date: 12/01/2023 CLINICAL DATA:  Elective surgery. EXAM: DG HIP (WITH OR WITHOUT PELVIS) 1V*L* COMPARISON:  09/01/2023. FINDINGS: Multiple intraoperative fluoroscopic spot images are provided. Interval left total hip arthroplasty. Normal alignment. Total fluoroscopy time: 17.7 seconds Total dose: Radiation Exposure Index (as provided by the fluoroscopic device): 2.77 mGy air Kerma Please see intraoperative findings for further detail. IMPRESSION: Intraoperative fluoroscopy of interval left total hip arthroplasty. Electronically Signed   By: Harrietta Sherry M.D.   On: 12/01/2023 12:28   DG C-Arm 1-60 Min-No Report Result Date: 12/01/2023 Fluoroscopy was utilized by the requesting physician.  No radiographic interpretation.   ECHOCARDIOGRAM COMPLETE Result Date: 11/26/2023    ECHOCARDIOGRAM REPORT   Patient Name:   [name] [place] Date of Exam: 11/26/2023 Medical Rec #:  [id number]          Height:       66.0 in Accession #:    [id number]         Weight:       212.9 lb Date of Birth:  1955/12/04         BSA:          2.054 m Patient Age:    67 years           BP:           118/78 mmHg Patient Gender: F  HR:           59 bpm. Exam Location:  [address] Procedure: 2D Echo, 3D Echo and Strain Analysis (Both Spectral and Color Flow            Doppler were utilized during procedure). Indications:    Z98.89 Mitral valve  repair  History:        Patient has prior history of Echocardiogram examinations, most                 recent 01/09/2022. CAD and Previous Myocardial Infarction, Prior                 CABG, PAD, Signs/Symptoms:Chest Pain; Risk Factors:Hypertension,                 Dyslipidemia and Former Smoker. Lupus. Ascending aortic                 aneurysm. Prediabetes.                  Mitral Valve: prosthetic annuloplasty ring valve is present in                 the mitral position.  Sonographer:    Jon Hacker RCS Referring Phys: [id number] KARDIE TOBB IMPRESSIONS  1. Left ventricular ejection fraction, by estimation, is 45 to 50%. Left ventricular ejection fraction by 3D volume is 50 %. The left ventricle has mildly decreased function. The left ventricle has no regional wall motion abnormalities. Left ventricular  diastolic function could not be evaluated. The average left ventricular global longitudinal strain is -15.6 %. The global longitudinal strain is abnormal.  2. Right ventricular systolic function is normal. The right ventricular size is normal.  3. Left atrial size was moderately dilated.  4. Tricuspid valve regurgitation is moderate.  5. The aortic valve is normal in structure. Aortic valve regurgitation is mild. PHT .  6. The mitral valve has been repaired/replaced. Mild to moderate mitral valve regurgitation. The mean mitral valve gradient is 5.0 mmHg. There is a prosthetic annuloplasty ring present in the mitral position. FINDINGS  Left Ventricle: Left ventricular ejection fraction, by estimation, is 45 to 50%. Left ventricular ejection fraction by 3D volume is 50 %. The left ventricle has mildly decreased function. The left ventricle has no regional wall motion abnormalities. The  average left ventricular global longitudinal strain is -15.6 %. Strain was performed and the global longitudinal strain is abnormal. The left ventricular internal cavity size was normal in size. There is no left ventricular  hypertrophy. Left ventricular  diastolic function could not be evaluated due to mitral valve repair. Left ventricular diastolic function could not be evaluated. Right Ventricle: The right ventricular size is normal. No increase in right ventricular wall thickness. Right ventricular systolic function is normal. Left Atrium: Left atrial size was moderately dilated. Right Atrium: Right atrial size was normal in size. Pericardium: There is no evidence of pericardial effusion. Mitral Valve: The mitral valve has been repaired/replaced. Mild to moderate mitral valve regurgitation. There is a prosthetic annuloplasty ring present in the mitral position. MV peak gradient, 13.6 mmHg. The mean mitral valve gradient is 5.0 mmHg. Tricuspid Valve: The tricuspid valve is normal in structure. Tricuspid valve regurgitation is moderate. Aortic Valve: The aortic valve is normal in structure. Aortic valve regurgitation is mild. Pulmonic Valve: The pulmonic valve was not well visualized. Pulmonic valve regurgitation is not visualized. Aorta: The aortic root is normal in size and structure. IAS/Shunts: The interatrial  septum was not well visualized. Additional Comments: 3D was performed not requiring image post processing on an independent workstation and was abnormal.  LEFT VENTRICLE PLAX 2D LVIDd:         5.09 cm LVIDs:         4.19 cm         2D Longitudinal LV PW:         1.11 cm         Strain LV IVS:        0.92 cm         2D Strain GLS   -14.4 % LVOT diam:     1.80 cm         (A4C): LV SV:         78              2D Strain GLS   -14.8 % LV SV Index:   38              (A3C): LVOT Area:     2.54 cm        2D Strain GLS   -17.6 %                                (A2C):                                2D Strain GLS   -15.6 %                                Avg:                                 3D Volume EF                                LV 3D EF:    Left                                             ventricul                                              ar                                             ejection                                             fraction                                             by 3D  volume is                                             50 %.                                 3D Volume EF:                                3D EF:        50 %                                LV EDV:       137 ml                                LV ESV:       68 ml                                LV SV:        69 ml RIGHT VENTRICLE RV Basal diam:  3.39 cm RV S prime:     12.60 cm/s TAPSE (M-mode): 2.3 cm RVSP:           32.6 mmHg LEFT ATRIUM             Index        RIGHT ATRIUM           Index LA diam:        5.20 cm 2.53 cm/m   RA Pressure: 3.00 mmHg LA Vol (A2C):   82.8 ml 40.32 ml/m  RA Area:     16.80 cm LA Vol (A4C):   99.3 ml 48.35 ml/m  RA Volume:   39.80 ml  19.38 ml/m LA Biplane Vol: 90.8 ml 44.21 ml/m  AORTIC VALVE LVOT Vmax:   119.00 cm/s LVOT Vmean:  82.300 cm/s LVOT VTI:    0.306 m  AORTA Ao Root diam: 3.30 cm Ao Asc diam:  3.80 cm MITRAL VALVE                TRICUSPID VALVE MV Area (PHT): 1.60 cm     TR Peak grad:   29.6 mmHg MV Area VTI:   1.22 cm     TR Vmax:        272.00 cm/s MV Peak grad:  13.6 mmHg    Estimated RAP:  3.00 mmHg MV Mean grad:  5.0 mmHg     RVSP:           32.6 mmHg MV Vmax:       1.85 m/s MV Vmean:      104.3 cm/s   SHUNTS MV Decel Time: 474 msec     Systemic VTI:  0.31 m MV E velocity: 159.00 cm/s  Systemic Diam: 1.80 cm MV A velocity: 113.00 cm/s MV E/A ratio:  1.41 Aditya Sabharwal Electronically signed by Ria Commander Signature Date/Time: 11/26/2023/6:56:52 PM    Final     Disposition: Discharge disposition: 01-Home or Self Care  Follow-up Information     Vernetta Lonni GRADE, MD Follow up in 2 week(s).   Specialty: Orthopedic Surgery Contact information: [address] [place]  [place] [address] [phone]         Joshua Debby CROME,  MD Follow up.   Specialty: Internal Medicine Contact information: [address] [place] [address] [phone]         [facility] Follow up.   Specialty: Home Health Services Why: Home heal;th services will be provided by [facility], start of care within 48 hours post discharge Contact information: [address] [place] [address] [phone]                  Signed: Lonni GRADE Vernetta 12/04/2023, 6:33 AM

## 2023-12-04 NOTE — Transitions of Care (Post Inpatient/ED Visit) (Signed)
 12/04/2023  Name: [name] MRN: [id number] DOB: 1956-02-17  Today's TOC FU Call Status: Today's TOC FU Call Status:: Successful TOC FU Call Completed TOC FU Call Complete Date: 12/04/23 Patient's Name and Date of Birth confirmed.  Transition Care Management Follow-up Telephone Call Date of Discharge: 12/03/23 Discharge Facility: [facility]) Type of Discharge: Inpatient Admission Primary Inpatient Discharge Diagnosis:: Left THR How have you been since you were released from the hospital?: Better Any questions or concerns?: No  Items Reviewed: Did you receive and understand the discharge instructions provided?: Yes Medications obtained,verified, and reconciled?: Yes (Medications Reviewed) Any new allergies since your discharge?: No Dietary orders reviewed?: Yes Type of Diet Ordered:: Heart Healthy Do you have support at home?: Yes People in Home [RPT]: alone, friend(s) Name of Support/Comfort Primary Source: Re[facility]  Medications Reviewed Today: Medications Reviewed Today     Reviewed by Moises Reusing, RN (Case Manager) on 12/04/23 at 1243  Med List Status: <None>   Medication Order Taking? Sig Documenting Provider Last Dose Status Informant  albuterol  (VENTOLIN  HFA) 108 (90 Base) MCG/ACT inhaler [id number] No Inhale 2 puffs into the lungs every 6 (six) hours as needed for wheezing or shortness of breath. Wendee Lynwood HERO, NP More than a month Active Self, Pharmacy Records  aspirin  81 MG chewable tablet [id number]  Chew 1 tablet (81 mg total) by mouth 2 (two) times daily. Vernetta Lonni GRADE, MD  Active   azaTHIOprine  (IMURAN ) 50 MG tablet [id number] No TAKE 1 TABLET BY MOUTH IN THE MORNING AND IN THE KARNA Joshua Debby LITTIE, MD 12/01/2023  4:00 AM Active Self, Pharmacy Records  cyclobenzaprine  (FLEXERIL ) 5 MG tablet [id number]  Take 1 tablet (5 mg total) by mouth 3 (three) times daily as needed for muscle spasms. Vernetta Lonni GRADE, MD  Active    dapagliflozin  propanediol (FARXIGA ) 10 MG TABS tablet [id number] No Take 1 tablet (10 mg total) by mouth daily. Tobb, Kardie, DO Past Week Active Self, Pharmacy Records  fluticasone  (FLONASE ) 50 MCG/ACT nasal spray [id number] No SPRAY 2 SPRAYS INTO EACH NOSTRIL EVERY DAY Norleen Lynwood ORN, MD Unknown Active Self, Pharmacy Records  furosemide  (LASIX ) 20 MG tablet [id number] No TAKE 1 TABLET BY MOUTH EVERY DAY Tobb, Kardie, DO Past Week Active Self, Pharmacy Records  HYDROcodone -acetaminophen  (NORCO/VICODIN) 5-325 MG tablet [id number] No Take 1-2 tablets by mouth 3 (three) times daily as needed for moderate pain (pain score 4-6).  Patient taking differently: Take 1 tablet by mouth daily as needed for moderate pain (pain score 4-6).   Vernetta Lonni GRADE, MD Past Month Active Self, Pharmacy Records  hydroxychloroquine  (PLAQUENIL ) 200 MG tablet [id number] No Take 200 mg by mouth 2 (two) times daily. [provider] 12/01/2023  4:00 AM Active Self, Pharmacy Records  isosorbide  mononitrate (IMDUR ) 30 MG 24 hr tablet [id number] No Take 1 tablet (30 mg total) by mouth daily. Tobb, Kardie, DO 12/01/2023  4:00 AM Active Self, Pharmacy Records  levothyroxine  (SYNTHROID ) 75 MCG tablet [id number] No Take 1 tablet (75 mcg total) by mouth daily before breakfast. Joshua Debby LITTIE, MD 12/01/2023  4:00 AM Active Self, Pharmacy Records  metoprolol  succinate (TOPROL -XL) 50 MG 24 hr tablet [id number] No Take 1 tablet (50 mg total) by mouth daily. Tobb, Kardie, DO 12/01/2023  4:00 AM Active Self, Pharmacy Records  nitroGLYCERIN  (NITROSTAT ) 0.4 MG SL tablet [id number]  Place 1 tablet (0.4 mg total) under the tongue every 5 (five) minutes as needed for chest pain. Tobb, Kardie, DO  Active Self,  Pharmacy Records  oxyCODONE  (OXY IR/ROXICODONE ) 5 MG immediate release tablet [id number]  Take 1-2 tablets (5-10 mg total) by mouth every 6 (six) hours as needed for moderate pain (pain score 4-6) (pain score 4-6). Vernetta Lonni GRADE, MD   Active   pantoprazole  (PROTONIX ) 40 MG tablet [id number] No Take 1 tablet (40 mg total) by mouth daily. Dunn, Dayna N, PA-C 12/01/2023  4:00 AM Active Self, Pharmacy Records  polyethylene glycol powder (GLYCOLAX /MIRALAX ) 17 GM/SCOOP powder [id number] No 17 gram po BID prn constipation  Patient taking differently: Take 17 g by mouth daily as needed for mild constipation or moderate constipation.   Midge Sober, DO Past Month Active Self, Pharmacy Records  potassium chloride  SA (KLOR-CON  M) 20 MEQ tablet [id number] No Take 1 tablet (20 mEq total) by mouth daily. Jerri Keys, MD 12/01/2023  4:00 AM Active Self, Pharmacy Records           Med Note (CRUTHIS, CHLOE C   Wed Nov 25, 2023 10:28 AM) No fill hx. Pt is adamant she is still taking this medication.   rosuvastatin  (CRESTOR ) 20 MG tablet [id number] No Take 1 tablet (20 mg total) by mouth daily. Tobb, Kardie, DO 11/30/2023 Active Self, Pharmacy Records  tirzepatide  (MOUNJARO ) 7.5 MG/0.5ML Pen [id number] No Inject 7.5 mg into the skin once a week.  Patient taking differently: Inject 7.5 mg into the skin once a week. Takes on Tuesday   Midge Sober, DO 11/03/2023 Active Self, Pharmacy Records  Vitamin D , Ergocalciferol , (DRISDOL ) 1.25 MG (50000 UNIT) CAPS capsule [id number] No Take 1 capsule (50,000 Units total) by mouth every 7 (seven) days.  Patient taking differently: Take 50,000 Units by mouth See admin instructions. Take 50,000 units by mouth every 10 days   Midge Sober, DO Past Week Active Self, Pharmacy Records            Home Care and Equipment/Supplies: Were Home Health Services Ordered?: Yes Name of Home Health Agency:: [facility] Has Agency set up a time to come to your home?: Yes First Home Health Visit Date: 12/04/23 Any new equipment or medical supplies ordered?: No  Functional Questionnaire: Do you need assistance with bathing/showering or dressing?: Yes (Supervision) Do you need assistance with meal preparation?: Yes (Temporary  while healing from surgery) Do you need assistance with eating?: No Do you have difficulty maintaining continence: No Do you need assistance with getting out of bed/getting out of a chair/moving?: Yes (Supervision) Do you have difficulty managing or taking your medications?: No  Follow up appointments reviewed: PCP Follow-up appointment confirmed?: Yes Date of PCP follow-up appointment?: 12/28/23 Follow-up Provider: Debby Molt Specialist [facility] Follow-up appointment confirmed?: Yes Date of Specialist follow-up appointment?: 12/09/23 Follow-Up Specialty Provider:: Lonni Vernetta Do you need transportation to your follow-up appointment?: No Do you understand care options if your condition(s) worsen?: Yes-patient verbalized understanding  SDOH Interventions Today    Flowsheet Row Most Recent Value  SDOH Interventions   Food Insecurity Interventions Intervention Not Indicated  Housing Interventions Intervention Not Indicated  Transportation Interventions Intervention Not Indicated  Utilities Interventions Intervention Not Indicated    Medford Balboa, BSN, RN [place]  VBCI - [facility] RN Care Manager [phone]

## 2023-12-07 ENCOUNTER — Telehealth: Payer: Self-pay | Admitting: Radiology

## 2023-12-07 NOTE — Telephone Encounter (Signed)
 Verbal order left on VM

## 2023-12-07 NOTE — Telephone Encounter (Signed)
 Chyrl from [facility] called triage, needing verbal orders for PT for [facility]. He is requesting 1 week 1, 3 week 1, and 1 week 1.  Please call and advise, ok to leave voicemail if needed. Call back [phone].

## 2023-12-09 ENCOUNTER — Telehealth (INDEPENDENT_AMBULATORY_CARE_PROVIDER_SITE_OTHER): Admitting: Family Medicine

## 2023-12-09 ENCOUNTER — Encounter (INDEPENDENT_AMBULATORY_CARE_PROVIDER_SITE_OTHER): Payer: Self-pay | Admitting: Family Medicine

## 2023-12-09 VITALS — Ht 64.5 in | Wt 215.0 lb

## 2023-12-09 DIAGNOSIS — E539 Vitamin B deficiency, unspecified: Secondary | ICD-10-CM

## 2023-12-09 DIAGNOSIS — R632 Polyphagia: Secondary | ICD-10-CM

## 2023-12-09 DIAGNOSIS — E88819 Insulin resistance, unspecified: Secondary | ICD-10-CM

## 2023-12-09 DIAGNOSIS — E559 Vitamin D deficiency, unspecified: Secondary | ICD-10-CM

## 2023-12-09 MED ORDER — VITAMIN D (ERGOCALCIFEROL) 1.25 MG (50000 UNIT) PO CAPS: ORAL_CAPSULE | ORAL | 0 refills | Status: DC

## 2023-12-09 NOTE — Progress Notes (Signed)
 [name], D.O.  ABFM, ABOM Specializing in Clinical Bariatric Medicine  Office located at: [address] [place], [place]  [address] I connected with current patient today and verified that I am speaking with the correct person using 2 identifiers.   Location of the patient: Home  Location of the provider (and medical scribe): Office Assessment and Plan:  No orders of the defined types were placed in this encounter.  There are no discontinued medications.  No orders of the defined types were placed in this encounter.    FOR THE DISEASE OF OBESITY:  *** Assessment & Plan: Body Composition  Body Fat %: -- (Virtual) Fat Mass (lbs): -- (Virtual) Muscle Mass (lbs): -- (Virtual) Total Body Water (lbs): -- (Virtual) Visceral Fat Rating : -- (Virtual)   Since last office visit on 11/09/2023 patient's muscle mass has {ELIncreased/Decreased:33631} by *** lbs. Fat mass has {ELIncreased/Decreased:33631} by *** lbs. Total body water has {ELIncreased/Decreased:33631} by *** lbs.  Body fat % has {ELIncreased/Decreased:33631} by *** %. Counseling done on how various foods will affect these numbers and how to maximize success.  Total lbs lost to date: *** lbs Anthropometric Measurements Height: 5' 4.5 (1.638 m) Weight: 215 lb (97.5 kg) BMI (Calculated): 36.35 Weight at Last Visit: 215lb Weight Lost Since Last Visit: 0lb Weight Gained Since Last Visit: 0lb Starting Weight: 258lb Total Weight Loss (lbs): 43 lb (19.5 kg)   Total weight loss percentage to date: *** %  - Tracking Calories/Macros: no  - Eating More Whole Foods: {Response; yes/no:64}  - Adequate Protein Intake: {Response; yes/no:64}  - Adequate Water Intake: {Response; yes/no:64}  - Skipping Meals: {Response; yes/no:64}  - Sleeping 7-9 Hours/ Night: {Response; yes/no:64}   Recommended Dietary Goals [name] is currently in the action stage of change. As such, her goal is to continue weight management  plan.  She has agreed to: {EMWTLOSSPLAN:29297::continue current plan}   Behavioral Intervention We discussed the following today: {dowtlossstrategies:31654}  Additional resources provided today: {DOhandouts:31655::None}  Evidence-based interventions for health behavior change were utilized today including the discussion of self monitoring techniques, problem-solving barriers and SMART goal setting techniques.   Regarding patient's less desirable eating habits and patterns, we employed the technique of small changes.   Goal(s) for next OV: Increase activity  Recommended Physical Activity Goals [name] has been advised to work up to 300-450 minutes of moderate intensity aerobic activity a week and strengthening exercises 2-3 times per week for cardiovascular health, weight loss maintenance and preservation of muscle mass.   She has agreed to: {EMEXERCISE:28847::Think about enjoyable ways to increase daily physical activity and overcoming barriers to exercise,Increase physical activity in their day and reduce sedentary time (increase NEAT).,Increase volume of physical activity to a goal of 240 minutes a week,Combine aerobic and strengthening exercises for efficiency and improved cardiometabolic health.}   Pharmacotherapy We both agreed to: {EMagreedrx:29170}   ASSOCIATED CONDITIONS ADDRESSED TODAY:  Insulin  resistance with polyphagia and carb cravings Assessment & Plan:  {EWCOURSE:24262} Goal is HgbA1c < 5.7, fasting insulin  closer to 5.  Medication: ***.    Plan:  She will continue to focus on protein-rich, low simple carbohydrate foods. We reviewed the importance of hydration, regular exercise for stress reduction, and restorative sleep.   Lab Results  Component Value Date   HGBA1C 5.2 11/10/2023   HGBA1C 5.3 03/19/2023   HGBA1C 5.2 04/23/2022   INSULIN  11.5 11/10/2023   INSULIN  8.3 09/11/2022   INSULIN  16.5 04/23/2022   Assessment: Condition is {EWCOURSE:24262}.  {  LabsReviewed (Optional):29178}. {lab discussion:29204}.     Plan:***  - Counseled patient on pathophysiology of disease and discussed how good blood sugar control is important to decrease the risk of diabetic complications such as nephropathy, neuropathy, limb loss, blindness, coronary artery disease, etc.   - Intensive lifestyle modification including diet, exercise and weight loss are the first line of treatment for diabetes. We extensively discussed the importance of decreasing simple carbs, increasing proteins and how certain foods they eat will affect their blood sugars - Reminded [name] if she feels poorly- check Blood Sugar and Blood Pressure at that time.    - Hypoglycemia prevention discussed with the patient.  Eat on a regular basis- no skipping or going long periods without eating.     - Recommend that any concerns about medicines should be directed at the prescribing provider - Recheck labs in 3 months if not done at Endo provider / PCP.  - Importance of f/up with PCP and all other specialists, as scheduled, was stressed to the patient today        Vitamin D  deficiency Assessment & Plan:  Assessment: Condition is {EWCOURSE:24262}. {LabsReviewed (Optional):29178}. {lab discussion:29204}.  Lab Results  Component Value Date   VD25OH 49.2 11/10/2023   VD25OH 36.7 08/06/2023   VD25OH 90.6 03/19/2023    Plan:*** - I discussed the importance of vitamin D  to the patient's health and well-being as well as to their ability to lose weight.  - I reviewed possible symptoms of low Vitamin D :  low energy, depressed mood, muscle aches, joint aches, osteoporosis etc. with patient - It has been show that administration of vitamin D  supplementation leads to improved satiety and a decrease in inflammatory markers.  Hence, low Vitamin D  levels may be linked to an increased risk of cardiovascular events and even increased risk of cancers- such as colon and breast. - ideal vitamin D   levels reviewed with patient   - Informed patient this may be a lifelong thing, and she was encouraged to continue to take the medicine until told otherwise.    - weight loss will likely improve availability of vitamin D , thus encouraged [name] to continue with meal plan and their weight loss efforts to further improve this condition.  Thus, we will need to monitor levels regularly (every 3-4 mo on average) to keep levels within normal limits and prevent over supplementation. - pt's questions and concerns regarding this condition addressed.    Vitamin B deficiency Lab Results  Component Value Date   VITAMINB12 1,803 (H) 11/10/2023        Follow up:   No follow-ups on file. ***   She was informed of the importance of frequent follow up visits to maximize her success with intensive lifestyle modifications for her multiple health conditions.  {Labs Ordered?:33632}  Subjective:    Chief complaint: Obesity [name] is here to discuss her progress with her obesity treatment plan. She is on {MWMwtlossportion/plan2:23431} and states she is following her eating plan approximately ***% of the time. {DOEXERCISE:33460}    Interval History:  [name] is here for a follow up office visit. {ELwtloss/gain:31279} since last OV on 11/09/2023.     Pharmacotherapy that aid with weight loss: She is currently taking {EMPharmaco:28845}.    Review of Systems:  Pertinent positives were addressed with patient today.  Reviewed by clinician on day of visit: allergies, medications, problem list, medical history, surgical history, family history, social history, and previous encounter notes.  Weight Summary and Biometrics  Weight Lost Since Last Visit: 0lb  Weight Gained Since Last Visit: 0lb  ***  Vitals Temp: -- (Virtual) BP: -- (Virtual) Pulse Rate: -- (Virtual) SpO2: -- (Virtual)   Anthropometric Measurements Height: 5' 4.5 (1.638 m) Weight: 215 lb (97.5 kg) BMI  (Calculated): 36.35 Weight at Last Visit: 215lb Weight Lost Since Last Visit: 0lb Weight Gained Since Last Visit: 0lb Starting Weight: 258lb Total Weight Loss (lbs): 43 lb (19.5 kg)   Body Composition  Body Fat %: -- (Virtual) Fat Mass (lbs): -- (Virtual) Muscle Mass (lbs): -- Scientist, water quality) Total Body Water (lbs): -- (Virtual) Visceral Fat Rating : -- (Virtual)   Other Clinical Data Fasting: No Labs: No Today's Visit #: 32 Starting Date: 04/09/21 Comments: Virtual    Objective:   PHYSICAL EXAM: Height 5' 4.5 (1.638 m), weight 215 lb (97.5 kg). Body mass index is 36.33 kg/m.  No physical exam could be performed with this format, beyond what could be discerned through video.  General: she is cooperative and in no acute distress. PSYCH: Has normal mood, affect and thought process.   HEENT: EOMI, sclerae are anicteric. Lungs: Normal breathing effort, no conversational dyspnea. Neurologic: A and O * 3, good insight  DIAGNOSTIC DATA REVIEWED: BMET    Component Value Date/Time   NA 140 12/02/2023 0429   NA 141 03/19/2023 0000   K 4.0 12/02/2023 0429   CL 106 12/02/2023 0429   CO2 25 12/02/2023 0429   GLUCOSE 113 (H) 12/02/2023 0429   BUN 15 12/02/2023 0429   BUN 18 03/19/2023 0000   CREATININE 1.06 (H) 12/02/2023 0429   CREATININE 1.17 (H) 02/22/2021 1501   CALCIUM  8.5 (L) 12/02/2023 0429   GFRNONAA 58 (L) 12/02/2023 0429   GFRAA 52 (L) 08/04/2018 0921   Lab Results  Component Value Date   HGBA1C 5.2 11/10/2023   HGBA1C 5.6 01/31/2017   Lab Results  Component Value Date   INSULIN  11.5 11/10/2023   INSULIN  17.2 04/09/2021   Lab Results  Component Value Date   TSH 1.50 03/19/2023   CBC    Component Value Date/Time   WBC 6.7 12/02/2023 0429   RBC 3.45 (L) 12/02/2023 0429   HGB 11.3 (L) 12/02/2023 0429   HCT 33.4 (L) 12/02/2023 0429   PLT 180 12/02/2023 0429   MCV 96.8 12/02/2023 0429   MCH 32.8 12/02/2023 0429   MCHC 33.8 12/02/2023 0429   RDW 17.2  (H) 12/02/2023 0429   Iron Studies    Component Value Date/Time   IRON 386 06/23/2017 0000   TIBC 285 06/23/2017 0000   FERRITIN 58 06/23/2017 0000   IRONPCTSAT 26 06/23/2017 0000   Lipid Panel     Component Value Date/Time   CHOL 130 03/19/2023 0000   CHOL 134 04/23/2022 1456   TRIG 47 03/19/2023 0000   HDL 49 03/19/2023 0000   HDL 55 04/23/2022 1456   CHOLHDL 3 03/02/2023 1552   VLDL 12.0 03/02/2023 1552   LDLCALC 64 03/19/2023 0000   LDLCALC 67 04/23/2022 1456   LDLCALC 68 11/14/2019 1537   LDLDIRECT 321.8 02/27/2012 1202   Hepatic Function Panel     Component Value Date/Time   PROT 7.2 03/02/2023 1552   PROT 7.2 04/23/2022 1456   ALBUMIN  4.3 03/19/2023 0000   ALBUMIN  4.2 04/23/2022 1456   AST 31 03/19/2023 0000   ALT 22 03/19/2023 0000   ALKPHOS 101 03/19/2023 0000   BILITOT 0.6 03/02/2023 1552   BILITOT 0.4 04/23/2022 1456   BILIDIR 0.2  03/19/2023 0000   BILIDIR 0.10 11/03/2016 1155   IBILI 0.2 11/14/2019 1537      Component Value Date/Time   TSH 1.50 03/19/2023 0000   TSH 1.06 03/02/2023 1552   Nutritional Lab Results  Component Value Date   VD25OH 49.2 11/10/2023   VD25OH 36.7 08/06/2023   VD25OH 90.6 03/19/2023    Attestations:   I, Damien Blanks, acting as a Stage manager for Meghan Jenkins, DO., have compiled all relevant documentation for today's office visit on behalf of Meghan Jenkins, DO, while in the presence of [employer], DO.  I have spent No LOS data to display  minutes in the care of the patient today including *** minutes face-to-face assessing and reviewing listed medical problems above as outlined in office visit note and providing nutritional and behavioral counseling as outlined in obesity care plan.   I have reviewed the above documentation for accuracy and completeness, and I agree with the above. Meghan JINNY Adams, D.O.  The 21st Century Cures Act was signed into law in 2016 which includes the topic of electronic health  records.  This provides immediate access to information in MyChart.  This includes consultation notes, operative notes, office notes, lab results and pathology reports.  If you have any questions about what you read please let us  know at your next visit so we can discuss your concerns and take corrective action if need be.  We are right here with you.

## 2023-12-11 ENCOUNTER — Ambulatory Visit: Payer: Self-pay | Admitting: Cardiology

## 2023-12-14 ENCOUNTER — Ambulatory Visit (INDEPENDENT_AMBULATORY_CARE_PROVIDER_SITE_OTHER): Admitting: Orthopaedic Surgery

## 2023-12-14 ENCOUNTER — Encounter: Payer: Self-pay | Admitting: Orthopaedic Surgery

## 2023-12-14 DIAGNOSIS — Z96642 Presence of left artificial hip joint: Secondary | ICD-10-CM

## 2023-12-14 NOTE — Progress Notes (Signed)
 The patient is here today for first postoperative visit status post a left direct anterior total hip replacement.  We actually replaced her right hip back in May of this year.  She has been compliant with a baby aspirin  twice daily.  She states that she does not need a refill of any type of medications.  She is ambulate with a cane.  She does state that the left hip has been more difficult for her from a pain standpoint in the right hip.  The left hip incision looks good.  Sutures have been removed and Steri-Strips applied.  There is no significant seroma.  Her calf is soft.  She can stop her baby aspirin  twice daily.  She will continue to slowly mobilize as comfort allows with not overdoing exercises.  She can drive from our standpoint since she is not asking for any other narcotics.  We will see her in a month to see how she doing overall.

## 2023-12-18 ENCOUNTER — Encounter: Payer: Self-pay | Admitting: *Deleted

## 2023-12-18 ENCOUNTER — Telehealth: Payer: Self-pay | Admitting: Orthopaedic Surgery

## 2023-12-18 NOTE — Telephone Encounter (Signed)
 Patient is s/p THA on 12/01/23. Patient is requesting RTW regular duty/no restrictions. Please advise if okay to complete RTW form. Thank you

## 2023-12-18 NOTE — Telephone Encounter (Signed)
 Completed. Placed at front desk for patient to pick up. Patient advised.

## 2023-12-22 ENCOUNTER — Ambulatory Visit: Attending: Cardiology | Admitting: Cardiology

## 2023-12-22 VITALS — BP 119/72 | HR 72 | Ht 67.0 in | Wt 210.0 lb

## 2023-12-22 DIAGNOSIS — I2510 Atherosclerotic heart disease of native coronary artery without angina pectoris: Secondary | ICD-10-CM

## 2023-12-22 DIAGNOSIS — R931 Abnormal findings on diagnostic imaging of heart and coronary circulation: Secondary | ICD-10-CM

## 2023-12-22 DIAGNOSIS — K5903 Drug induced constipation: Secondary | ICD-10-CM | POA: Diagnosis not present

## 2023-12-22 MED ORDER — METOPROLOL SUCCINATE ER 25 MG PO TB24: 12.5000 mg | ORAL_TABLET | Freq: Every day | ORAL | 3 refills | Status: DC

## 2023-12-22 MED ORDER — METOPROLOL SUCCINATE ER 25 MG PO TB24: 25.0000 mg | ORAL_TABLET | Freq: Every day | ORAL | 3 refills | Status: DC

## 2023-12-22 NOTE — Patient Instructions (Signed)
 Medication Instructions:  Your physician has recommended you make the following change in your medication:  DECREASE: Toprol -XL 25 mg once daily  *If you need a refill on your cardiac medications before your next appointment, please call your pharmacy*   Follow-Up: At [facility], you and your health needs are our priority.  As part of our continuing mission to provide you with exceptional heart care, our providers are all part of one team.  This team includes your primary Cardiologist (physician) and Advanced Practice Providers or APPs (Physician Assistants and Nurse Practitioners) who all work together to provide you with the care you need, when you need it.  Your next appointment:   16-24 week(s)  Provider:   Kardie Tobb, DO

## 2023-12-23 ENCOUNTER — Telehealth: Payer: Self-pay | Admitting: Cardiology

## 2023-12-23 MED ORDER — LOSARTAN POTASSIUM 25 MG PO TABS: 12.5000 mg | ORAL_TABLET | Freq: Every day | ORAL | 2 refills | Status: DC

## 2023-12-23 NOTE — Telephone Encounter (Signed)
 Pt's medication was sent to pt's pharmacy as requested. Confirmation received.

## 2023-12-23 NOTE — Telephone Encounter (Signed)
*  STAT* If patient is at the pharmacy, call can be transferred to refill team.   1. Which medications need to be refilled? (please list name of each medication and dose if known) losartan  (COZAAR ) 25 MG tablet  2. Which pharmacy/location (including street and city if local pharmacy) is medication to be sent to? [id number] [address] [place], [place] - [address]   3. Do they need a 30 day or 90 day supply?  90 day supply

## 2023-12-26 NOTE — Progress Notes (Signed)
 Virtual Visit via Video Note   Because of [name]'s co-morbid illnesses, she is at least at moderate risk for complications without adequate follow up.  This format is felt to be most appropriate for this patient at this time.  All issues noted in this document were discussed and addressed.  A limited physical exam was performed with this format.  Please refer to the patient's chart for her consent to telehealth for [facility].      Date:  12/26/2023   ID:  Jon Dux Pyo, DOB December 09, 1955, MRN [id number]  Patient Location: Home Provider Location: Office/Clinic  PCP:  Joshua Debby CROME, MD  Cardiologist:  Dub Huntsman, DO  Electrophysiologist:  None   Evaluation Performed:  Follow-Up Visit  Chief Complaint:   I recently had surgery Prior to visit patient confirmed to patient identifiers.  History of Present Illness:    [name] is a 68 y.o. female with a hx of lupus erythematous, coronary artery disease with prior history of stenting and ultimately four-vessel bypass in 2018, mitral valve annuloplasty at the time of her CABG in 2018, postoperative atrial fibrillation, peripheral artery disease and ascending aortic aneurysm.   The patient does not have symptoms concerning for COVID-19 infection (fever, chills, cough, or new shortness of breath).   She is post surgery.   Past Medical History:  Diagnosis Date   Arthritis    Back pain    Bilateral swelling of feet and ankles    no current problems per pt 07/30/23   CAD (coronary artery disease)    a. remote stents/angioplasty. b. s/p CABG 01/2017 with mitral valve annuloplasty.   Chest pain    Constipation    Diastolic heart failure (HCC)    Esophageal reflux    Gallbladder problem    GERD (gastroesophageal reflux disease)    History of MI (myocardial infarction)    Hyperlipidemia    Hypertension    Hypothyroidism    Immune thrombocytopenic purpura (HCC)    Joint pain    Lupus  erythematosus    in remission since 2013   Mitral valve insufficiency and aortic valve insufficiency    Myocardial infarction (HCC)    x 2   Pneumonia    x several   PONV (postoperative nausea and vomiting)    Postoperative atrial fibrillation (HCC)    Pre-diabetes    Renal disease    stage 3   Severe mitral regurgitation    a. s/p MV annuloplasty 01/2017 at time of CABG.   Shortness of breath on exertion    no current problems - lost weight, stop smoking and hgb wnl in 02/2023   Sleep apnea    does not use CPAP - patient states mild, lost weight   Status post mitral valve annuloplasty    Unspecified disease of pericardium    Past Surgical History:  Procedure Laterality Date   BREAST BIOPSY Right 06/03/2023   MM RT BREAST BX W LOC DEV 1ST LESION IMAGE BX SPEC STEREO GUIDE 06/03/2023 [facility]MO[facility]   CHOLECYSTECTOMY, LAPAROSCOPIC  2010   CORONARY ARTERY BYPASS GRAFT N/A 01/30/2017   Procedure: CORONARY ARTERY BYPASS GRAFTING (CABG), Times four  using the right saphaneous vein, harvested endoscopicly.  and left internal mammary artery .;  Surgeon: Lucas Dorise POUR, MD;  Location: [facility] OR;  Service: Open Heart Surgery;  Laterality: N/A;   LEFT HEART CATH AND CORONARY ANGIOGRAPHY N/A 01/23/2017   Procedure: LEFT HEART CATH AND CORONARY ANGIOGRAPHY;  Surgeon: Claudene Victory ORN, MD;  Location: [facility] INVASIVE CV LAB;  Service: Cardiovascular;  Laterality: N/A;   MITRAL VALVE REPAIR N/A 01/30/2017   Procedure: MITRAL VALVE REPAIR (MVR);  Surgeon: Lucas Dorise POUR, MD;  Location: [facility] OR;  Service: Open Heart Surgery;  Laterality: N/A;   plastic surgical repair      dog bite to leg   RIGHT/LEFT HEART CATH AND CORONARY/GRAFT ANGIOGRAPHY N/A 02/26/2021   Procedure: RIGHT/LEFT HEART CATH AND CORONARY/GRAFT ANGIOGRAPHY;  Surgeon: Claudene Victory ORN, MD;  Location: [facility] INVASIVE CV LAB;  Service: Cardiovascular;  Laterality: N/A;   SPLENECTOMY  5-04   TEE WITHOUT CARDIOVERSION N/A 01/27/2017   Procedure:  TRANSESOPHAGEAL ECHOCARDIOGRAM (TEE);  Surgeon: Francyne Headland, MD;  Location: [facility] ENDOSCOPY;  Service: Cardiovascular;  Laterality: N/A;   TEE WITHOUT CARDIOVERSION N/A 01/30/2017   Procedure: TRANSESOPHAGEAL ECHOCARDIOGRAM (TEE);  Surgeon: Lucas Dorise POUR, MD;  Location: [facility] OR;  Service: Open Heart Surgery;  Laterality: N/A;   TOTAL HIP ARTHROPLASTY Right 08/11/2023   Procedure: ARTHROPLASTY, HIP, TOTAL, ANTERIOR APPROACH;  Surgeon: Vernetta Lonni GRADE, MD;  Location: [facility] OR;  Service: Orthopedics;  Laterality: Right;   TOTAL HIP ARTHROPLASTY Left 12/01/2023   Procedure: ARTHROPLASTY, LEFT HIP, TOTAL, ANTERIOR APPROACH;  Surgeon: Vernetta Lonni GRADE, MD;  Location: [facility] OR;  Service: Orthopedics;  Laterality: Left;   ULTRASOUND GUIDANCE FOR VASCULAR ACCESS  01/23/2017   Procedure: Ultrasound Guidance For Vascular Access;  Surgeon: Claudene Victory ORN, MD;  Location: [facility] INVASIVE CV LAB;  Service: Cardiovascular;;     Current Meds  Medication Sig   albuterol  (VENTOLIN  HFA) 108 (90 Base) MCG/ACT inhaler Inhale 2 puffs into the lungs every 6 (six) hours as needed for wheezing or shortness of breath.   aspirin  81 MG chewable tablet Chew 1 tablet (81 mg total) by mouth 2 (two) times daily.   azaTHIOprine  (IMURAN ) 50 MG tablet TAKE 1 TABLET BY MOUTH IN THE MORNING AND IN THE EVENING   cyclobenzaprine  (FLEXERIL ) 5 MG tablet Take 1 tablet (5 mg total) by mouth 3 (three) times daily as needed for muscle spasms.   dapagliflozin  propanediol (FARXIGA ) 10 MG TABS tablet Take 1 tablet (10 mg total) by mouth daily.   furosemide  (LASIX ) 20 MG tablet TAKE 1 TABLET BY MOUTH EVERY DAY   hydroxychloroquine  (PLAQUENIL ) 200 MG tablet Take 200 mg by mouth 2 (two) times daily.   isosorbide  mononitrate (IMDUR ) 30 MG 24 hr tablet Take 1 tablet (30 mg total) by mouth daily.   levothyroxine  (SYNTHROID ) 75 MCG tablet Take 1 tablet (75 mcg total) by mouth daily before breakfast.   nitroGLYCERIN  (NITROSTAT ) 0.4 MG SL tablet  Place 1 tablet (0.4 mg total) under the tongue every 5 (five) minutes as needed for chest pain.   oxyCODONE  (OXY IR/ROXICODONE ) 5 MG immediate release tablet Take 1-2 tablets (5-10 mg total) by mouth every 6 (six) hours as needed for moderate pain (pain score 4-6) (pain score 4-6).   pantoprazole  (PROTONIX ) 40 MG tablet Take 1 tablet (40 mg total) by mouth daily.   potassium chloride  SA (KLOR-CON  M) 20 MEQ tablet Take 1 tablet (20 mEq total) by mouth daily.   rosuvastatin  (CRESTOR ) 20 MG tablet Take 1 tablet (20 mg total) by mouth daily.   Vitamin D , Ergocalciferol , (DRISDOL ) 1.25 MG (50000 UNIT) CAPS capsule Take 50,000 units by mouth every 10 days   [DISCONTINUED] losartan  (COZAAR ) 25 MG tablet Take 12.5 mg by mouth daily.   [DISCONTINUED] metoprolol  succinate (TOPROL  XL) 25 MG 24 hr  tablet Take 0.5 tablets (12.5 mg total) by mouth daily.   [DISCONTINUED] metoprolol  succinate (TOPROL -XL) 50 MG 24 hr tablet Take 1 tablet (50 mg total) by mouth daily.     Allergies:   Amlodipine  and Cellcept  [mycophenolate ]   Social History   Tobacco Use   Smoking status: Former    Current packs/day: 0.00    Average packs/day: 1 pack/day for 30.0 years (30.0 ttl pk-yrs)    Types: Cigarettes    Start date: 03/23/1986    Quit date: 12/23/2015    Years since quitting: 8.0    Passive exposure: Past   Smokeless tobacco: Never  Vaping Use   Vaping status: Never Used  Substance Use Topics   Alcohol use: No   Drug use: No     Family Hx: The patient's family history includes Breast cancer in her mother and sister; Cancer in her mother and paternal grandfather; Fibromyalgia in her mother; GER disease in her father; Heart attack in her maternal grandmother; Heart disease in her father and mother; Hyperlipidemia in her mother; Hypertension in her father; Paget's disease of bone in her mother.  ROS:   Please see the history of present illness.     All other systems reviewed and are negative.   Prior CV  studies:   The following studies were reviewed today: Echo    Labs/Other Tests and Data Reviewed:    EKG:  None today   Recent Labs: 03/19/2023: ALT 22; TSH 1.50 12/02/2023: BUN 15; Creatinine, Ser 1.06; Hemoglobin 11.3; Platelets 180; Potassium 4.0; Sodium 140   Recent Lipid Panel Lab Results  Component Value Date/Time   CHOL 130 03/19/2023 12:00 AM   CHOL 134 04/23/2022 02:56 PM   TRIG 47 03/19/2023 12:00 AM   HDL 49 03/19/2023 12:00 AM   HDL 55 04/23/2022 02:56 PM   CHOLHDL 3 03/02/2023 03:52 PM   LDLCALC 64 03/19/2023 12:00 AM   LDLCALC 67 04/23/2022 02:56 PM   LDLCALC 68 11/14/2019 03:37 PM   LDLDIRECT 321.8 02/27/2012 12:02 PM    Wt Readings from Last 3 Encounters:  12/22/23 210 lb (95.3 kg)  12/09/23 215 lb (97.5 kg)  12/01/23 212 lb (96.2 kg)     Objective:    Vital Signs:  BP 119/72   Pulse 72   Ht 5' 7 (1.702 m)   Wt 210 lb (95.3 kg)   BMI 32.89 kg/m     ASSESSMENT & PLAN:    Mildly depressed ejection fraction on recent echo - Ef 45-50% will optimized medications for GDMT as blood pressure can tolerate. Decreased metoprolol  to 25 mg daily and start Losartan  12.5 mg daily.  CAD - no anginal symptoms. Continue Aspirin  and statin  Hyperlipidemia - continue statin  COVID-19 Education: The signs and symptoms of COVID-19 were discussed with the patient and how to seek care for testing (follow up with PCP or arrange E-visit).  The importance of social distancing was discussed today.  Time:   Today, I have spent 15 minutes with the patient with telehealth technology discussing the above problems.     Medication Adjustments/Labs and Tests Ordered: Current medicines are reviewed at length with the patient today.  Concerns regarding medicines are outlined above.   Tests Ordered: No orders of the defined types were placed in this encounter.   Medication Changes: Meds ordered this encounter  Medications   DISCONTD: metoprolol  succinate (TOPROL  XL) 25 MG  24 hr tablet    Sig: Take 0.5 tablets (12.5 mg total)  by mouth daily.    Dispense:  45 tablet    Refill:  3    Dose decrease   metoprolol  succinate (TOPROL  XL) 25 MG 24 hr tablet    Sig: Take 1 tablet (25 mg total) by mouth daily.    Dispense:  90 tablet    Refill:  3    Dose decrease    Follow Up:  In Person in 6 month(s)  Signed, Mauricio Dahlen, DO  12/26/2023 4:34 PM    [facility]

## 2023-12-28 ENCOUNTER — Telehealth: Payer: Self-pay

## 2023-12-28 ENCOUNTER — Ambulatory Visit (INDEPENDENT_AMBULATORY_CARE_PROVIDER_SITE_OTHER): Admitting: Physician Assistant

## 2023-12-28 ENCOUNTER — Other Ambulatory Visit: Payer: Self-pay

## 2023-12-28 ENCOUNTER — Encounter: Payer: Self-pay | Admitting: Cardiology

## 2023-12-28 DIAGNOSIS — M25562 Pain in left knee: Secondary | ICD-10-CM | POA: Diagnosis not present

## 2023-12-28 DIAGNOSIS — G8929 Other chronic pain: Secondary | ICD-10-CM

## 2023-12-28 NOTE — Telephone Encounter (Signed)
 Patient identification verified by 2 forms.   Called and spoke to patient  Patient states:  -Pt states she is dizzy, weak, in a fib.  -Pt states she feels better with just taking Metoprolol  50 mg once.  -Most recent blood pressure 108/55 HR 72  Patient denies:  -Pt has not taken Losartan  12.5 mg today.        Interventions/Plan: -Pt will continue to take Metoprolol  50 mg once daily until she gets further recommendations from Dr. Sheena.   Reviewed ED warning signs/precautions  Patient agrees with plan, no questions at this time

## 2023-12-28 NOTE — Progress Notes (Signed)
 HPI: [name] comes in today due to left knee pain.  She states she has had left knee pain since undergoing a left total hip arthroplasty 11/27/2023.  She states she is limping using a cane.  She does have some pain in the right knee but not to the point that she has the left.  Pains anterior joint pain.  She feels that her knee is caving in.  She has pain constantly but that is increased with ambulation.  No new injuries.  She been taking Tylenol  Extra Strength without any real relief.  Review of systems: See HPI.  Physical exam: General well-developed well-nourished female no acute distress. Left knee: Good range of motion of the left knee.  Tenderness over the distal anterior femur and the proximal tib-fib region.  She is able to do a straight leg raise.  Slight hyperextension of the left knee flexion to at least 100 degrees.  No abnormal warmth erythema or effusion.  No gross instability valgus varus stressing.  Nontender medial lateral joint lines.  Radiographs: AP lateral views left knee: No acute fractures.  Cystic changes distal femur proximal tibia and fibula and mid body of the patella.  Cystic changes may be chronic process/enchondroma.  Medial lateral joint lines well-preserved.  Mild patellofemoral arthritis.  Impression: Left knee pain acute  Plan: Will obtain an MRI of her left knee to evaluate cystic changes of the distal femur proximal tibia and patella.  Have her follow-up after the MRI to go over the results and discuss further treatment.  Questions were encouraged and answered at length

## 2023-12-31 ENCOUNTER — Inpatient Hospital Stay: Admitting: Internal Medicine

## 2024-01-01 ENCOUNTER — Telehealth: Payer: Self-pay | Admitting: Orthopaedic Surgery

## 2024-01-01 NOTE — Telephone Encounter (Signed)
 Holding for Ashley/Brynn--MRI was to assess cystic changes in bone. Unsure how quickly Tory needs her to follow up.

## 2024-01-01 NOTE — Telephone Encounter (Signed)
 Patient called and said she needs to get in asap after her MRI on the 01/02/2024. CB#[phone]

## 2024-01-02 ENCOUNTER — Ambulatory Visit
Admission: RE | Admit: 2024-01-02 | Discharge: 2024-01-02 | Disposition: A | Source: Ambulatory Visit | Attending: Physician Assistant | Admitting: Physician Assistant

## 2024-01-02 DIAGNOSIS — G8929 Other chronic pain: Secondary | ICD-10-CM

## 2024-01-05 ENCOUNTER — Ambulatory Visit (INDEPENDENT_AMBULATORY_CARE_PROVIDER_SITE_OTHER): Admitting: Physician Assistant

## 2024-01-05 ENCOUNTER — Encounter: Payer: Self-pay | Admitting: Physician Assistant

## 2024-01-05 DIAGNOSIS — M1712 Unilateral primary osteoarthritis, left knee: Secondary | ICD-10-CM | POA: Diagnosis not present

## 2024-01-05 DIAGNOSIS — Z96642 Presence of left artificial hip joint: Secondary | ICD-10-CM

## 2024-01-05 MED ORDER — METHYLPREDNISOLONE ACETATE 40 MG/ML IJ SUSP
40.0000 mg | INTRAMUSCULAR | Status: AC | PRN
  Administered 2024-01-05: 40 mg via INTRA_ARTICULAR

## 2024-01-05 MED ORDER — LIDOCAINE HCL 1 % IJ SOLN
3.0000 mL | INTRAMUSCULAR | Status: AC | PRN
  Administered 2024-01-05: 3 mL

## 2024-01-05 MED ORDER — HYDROCODONE-ACETAMINOPHEN 5-325 MG PO TABS: 1.0000 | ORAL_TABLET | Freq: Three times a day (TID) | ORAL | 0 refills | Status: AC | PRN

## 2024-01-05 NOTE — Telephone Encounter (Signed)
 Scheduled patient 2pm 10/14

## 2024-01-05 NOTE — Progress Notes (Signed)
 HPI: [name] comes in today to go over the MRI of her left knee without contrast.  She continues to have pain in the left knee.  MRI images reviewed with the patient.  MRI shows multiple bony infarcts there without subchondral collapse or acute edema pattern.  Medial and lateral meniscus without any evidence of tear.  ACL PCL MCL and fibular collateral ligament intact.  Central patella with areas of moderate chondromalacia with subchondral reactive edema.  Mild chondromalacia posterior lateral tibial plateau with subchondral cystic changes.  No effusion.  Review of systems: No fevers chills.  Left knee: Tenderness over the anterior proximal tib-fib.  She is able to extend her leg fully slightly hyper in flexion to at least 100 degrees no abnormal warmth erythema or effusion.  Impression: Left knee osteoarthritis  Plan: Per her wishes she is provided with a cortisone injection left knee without any immediate complication.  She will work on Dance movement psychotherapist.  She knows to wait at least 3 months between cortisone injections.  Follow-up as scheduled for her left total hip arthroplasty which she underwent on 11/27/2023.  We did call in some hydrocodone  for her today.    Procedure Note  Patient: [name]             Date of Birth: [birth year]           MRN: [id number]             Visit Date: 01/05/2024  Procedures: Visit Diagnoses:  1. Primary osteoarthritis of left knee     Large Joint Inj: L knee on 01/05/2024 5:55 PM Indications: pain Details: 22 G 1.5 in needle, anterolateral approach  Arthrogram: No  Medications: 3 mL lidocaine  1 %; 40 mg methylPREDNISolone  acetate 40 MG/ML Outcome: tolerated well, no immediate complications Procedure, treatment alternatives, risks and benefits explained, specific risks discussed. Consent was given by the patient. Immediately prior to procedure a time out was called to verify the correct patient, procedure, equipment, support staff and  site/side marked as required. Patient was prepped and draped in the usual sterile fashion.

## 2024-01-13 ENCOUNTER — Ambulatory Visit: Admitting: Orthopaedic Surgery

## 2024-01-18 ENCOUNTER — Encounter (INDEPENDENT_AMBULATORY_CARE_PROVIDER_SITE_OTHER): Payer: Self-pay | Admitting: Family Medicine

## 2024-01-18 ENCOUNTER — Ambulatory Visit (INDEPENDENT_AMBULATORY_CARE_PROVIDER_SITE_OTHER): Admitting: Family Medicine

## 2024-01-18 DIAGNOSIS — E88819 Insulin resistance, unspecified: Secondary | ICD-10-CM | POA: Diagnosis not present

## 2024-01-18 DIAGNOSIS — I10 Essential (primary) hypertension: Secondary | ICD-10-CM

## 2024-01-18 DIAGNOSIS — R632 Polyphagia: Secondary | ICD-10-CM | POA: Diagnosis not present

## 2024-01-18 DIAGNOSIS — E559 Vitamin D deficiency, unspecified: Secondary | ICD-10-CM | POA: Diagnosis not present

## 2024-01-18 DIAGNOSIS — Z6835 Body mass index (BMI) 35.0-35.9, adult: Secondary | ICD-10-CM

## 2024-01-18 DIAGNOSIS — E669 Obesity, unspecified: Secondary | ICD-10-CM

## 2024-01-18 MED ORDER — TIRZEPATIDE 7.5 MG/0.5ML ~~LOC~~ SOAJ: 7.5000 mg | SUBCUTANEOUS | 0 refills | Status: DC

## 2024-01-18 NOTE — Progress Notes (Signed)
 [name], D.O.  ABFM, ABOM Specializing in Clinical Bariatric Medicine  Office located at: [address] [place], [place]  [address]      A) FOR THE CHRONIC DISEASE OF OBESITY:  Chief complaint: Obesity [name] is here to discuss her progress with her obesity treatment plan.   History of present illness / Interval history:  [name] is here today for her follow-up office visit.  Since last OV on 12/09/2023, pt is down 8 lbs.   Had left hip surgery, so has not been exercising as much, but has been working on eating more healthy lately. Pt reports that her father passed away yesterday. Reports having a good support system to help with stress management.     12/09/23 14:39 01/18/24 15:00   Body Fat %  --  [1] 49.3 %  Muscle Mass (lbs)  --  [1] 99.6 lbs  Fat Mass (lbs)  --  [1] 102.2 lbs  Total Body Water (lbs)  --  [1] 71.8 lbs  Visceral Fat Rating   --  [1] 15  Counseling done on how various foods will affect these numbers and how to maximize success   Total lbs lost to date: -51 lbs Total Fat Mass in lbs lost to date: -30.4 Total weight loss percentage to date: -19.77 %   Nutrition Therapy She is on the Category 1 Plan and journaling daily intake  and states she is following her eating plan approximately 90 % of the time.   - Tracking Calories/Macros: no -    - Eating More Whole Foods: yes  - Adequate Protein Intake: no -    - Adequate Water Intake: yes  - Skipping Meals: no -    - Sleeping 7-9 Hours/ Night: yes; melatonin has been helping   [name] is currently in the action stage of change. As such, her goal is to continue weight management plan.  She has agreed to: continue current plan   Physical Activity Pt is going to the gym once per week due to hip surgery.   [name] has been advised to work up to 300-450 minutes of moderate intensity aerobic activity a week and strengthening exercises 2-3 times per week for cardiovascular health,  weight loss maintenance and preservation of muscle mass.  She has agreed to : Think about enjoyable ways to increase daily physical activity and overcoming barriers to exercise, Increase physical activity in their day and reduce sedentary time (increase NEAT)., Increase volume of physical activity to a goal of 240 minutes a week, and Combine aerobic and strengthening exercises for efficiency and improved cardiometabolic health.   Behavioral Modifications Evidence-based interventions for health behavior change were utilized today including the discussion of  1) self monitoring techniques:  journal  2) problem-solving barriers:  none 3) self care:  none  4) SMART goals for next OV:  increase exercise as able Regarding patient's less desirable eating habits and patterns, we employed the technique of small changes.   We discussed the following today: decreasing simple carbohydrates , increasing water intake , and work on managing stress, creating time for self-care and relaxation Additional resources provided today: None   Medical Interventions/ Pharmacotherapy Previous Bariatric surgery: none Pharmacotherapy for weight loss: She is currently prescribed  Mounjaro  7.5 mg weekly for medical weight loss.  Has been off of medication for the past couple of weeks due to hip surgery.   We discussed various medication options to help [name] with her weight loss efforts  and we both agreed to : Continue with current nutritional and behavioral strategies   B) OBESITY RELATED CONDITIONS ADDRESSED TODAY:  Obesity (HCC)-starting bmi 41.64 BMI 35.0-35.9,adult, current BMI 35   Insulin  resistance with polyphagia and carb cravings Assessment & Plan Lab Results  Component Value Date   HGBA1C 5.2 11/10/2023   HGBA1C 5.3 03/19/2023   HGBA1C 5.2 04/23/2022   INSULIN  11.5 11/10/2023   INSULIN  8.3 09/11/2022   INSULIN  16.5 04/23/2022  Is prescribed Mounjaro  7.5 mg weekly. Has been off of the medication  for three weeks due to hip surgery. Discussed potentially increasing the dose, but because she has been off medication for a couple of weeks, we deferred for now. Will reassess at next OV. A1c levels at goal today. No acute concerns. Cont prudent nutritional plan. Cont medication (refill today).    Essential hypertension Assessment & Plan BP Readings from Last 3 Encounters:  01/18/24 128/83  12/22/23 119/72  12/03/23 (!) 110/57   The ASCVD Risk score (Arnett DK, et al., 2019) failed to calculate for the following reasons:   Risk score cannot be calculated because patient has a medical history suggesting prior/existing ASCVD  Lab Results  Component Value Date   CREATININE 1.06 (H) 12/02/2023  Was prescribed losartan  but is no longer taking it because she went into afib while taking it. Currently on 50 mg of metoprolol  with good compliance and tolerance. BP is at goal today. No acute concerns. Cont low-sodium, heart healthy diet. Advance exercise as able.    Vitamin D  deficiency Assessment & Plan  Lab Results  Component Value Date   VD25OH 49.2 11/10/2023   VD25OH 36.7 08/06/2023   VD25OH 90.6 03/19/2023  Currently on Ergo 50,000 units every 10 days with good compliance and tolerance. Vit D levels slightly below goal today. No acute concerns. Cont supplementation and recheck levels as needed.        Medications Discontinued During This Encounter  Medication Reason   tirzepatide  (MOUNJARO ) 7.5 MG/0.5ML Pen Reorder     Meds ordered this encounter  Medications   tirzepatide  (MOUNJARO ) 7.5 MG/0.5ML Pen    Sig: Inject 7.5 mg into the skin once a week.    Dispense:  2 mL    Refill:  0      Follow up:   Return 02/24/2024 3:40 PM.  She was informed of the importance of frequent follow up visits to maximize her success with intensive lifestyle modifications for her multiple health conditions.   Weight Summary and Biometrics   Weight Lost Since Last Visit: 8lb  Weight  Gained Since Last Visit: 0lb    Vitals Temp: (!) 97.3 F (36.3 C) BP: 128/83 Pulse Rate: (!) 59 SpO2: 96 %   Anthropometric Measurements Height: 5' 4.5 (1.638 m) Weight: 207 lb (93.9 kg) BMI (Calculated): 35 Weight at Last Visit: 215lb Weight Lost Since Last Visit: 8lb Weight Gained Since Last Visit: 0lb Starting Weight: 258lb Total Weight Loss (lbs): 51 lb (23.1 kg)   Body Composition  Body Fat %: 49.3 % Fat Mass (lbs): 102.2 lbs Muscle Mass (lbs): 99.6 lbs Total Body Water (lbs): 71.8 lbs Visceral Fat Rating : 15   Other Clinical Data Fasting: no Labs: no Today's Visit #: 33 Starting Date: 04/09/21    Objective:   PHYSICAL EXAM: Blood pressure 128/83, pulse (!) 59, temperature (!) 97.3 F (36.3 C), height 5' 4.5 (1.638 m), weight 207 lb (93.9 kg), SpO2 96%. Body mass index is 34.98 kg/m.  General: she is  overweight, cooperative and in no acute distress. PSYCH: Has normal mood, affect and thought process.   HEENT: EOMI, sclerae are anicteric. Lungs: Normal breathing effort, no conversational dyspnea. Extremities: Moves * 4 Neurologic: A and O * 3, good insight  DIAGNOSTIC DATA REVIEWED: BMET    Component Value Date/Time   NA 140 12/02/2023 0429   NA 141 03/19/2023 0000   K 4.0 12/02/2023 0429   CL 106 12/02/2023 0429   CO2 25 12/02/2023 0429   GLUCOSE 113 (H) 12/02/2023 0429   BUN 15 12/02/2023 0429   BUN 18 03/19/2023 0000   CREATININE 1.06 (H) 12/02/2023 0429   CREATININE 1.17 (H) 02/22/2021 1501   CALCIUM  8.5 (L) 12/02/2023 0429   GFRNONAA 58 (L) 12/02/2023 0429   GFRAA 52 (L) 08/04/2018 0921   Lab Results  Component Value Date   HGBA1C 5.2 11/10/2023   HGBA1C 5.6 01/31/2017   Lab Results  Component Value Date   INSULIN  11.5 11/10/2023   INSULIN  17.2 04/09/2021   Lab Results  Component Value Date   TSH 1.50 03/19/2023   CBC    Component Value Date/Time   WBC 6.7 12/02/2023 0429   RBC 3.45 (L) 12/02/2023 0429   HGB 11.3  (L) 12/02/2023 0429   HCT 33.4 (L) 12/02/2023 0429   PLT 180 12/02/2023 0429   MCV 96.8 12/02/2023 0429   MCH 32.8 12/02/2023 0429   MCHC 33.8 12/02/2023 0429   RDW 17.2 (H) 12/02/2023 0429   Iron Studies    Component Value Date/Time   IRON 386 06/23/2017 0000   TIBC 285 06/23/2017 0000   FERRITIN 58 06/23/2017 0000   IRONPCTSAT 26 06/23/2017 0000   Lipid Panel     Component Value Date/Time   CHOL 130 03/19/2023 0000   CHOL 134 04/23/2022 1456   TRIG 47 03/19/2023 0000   HDL 49 03/19/2023 0000   HDL 55 04/23/2022 1456   CHOLHDL 3 03/02/2023 1552   VLDL 12.0 03/02/2023 1552   LDLCALC 64 03/19/2023 0000   LDLCALC 67 04/23/2022 1456   LDLCALC 68 11/14/2019 1537   LDLDIRECT 321.8 02/27/2012 1202   Hepatic Function Panel     Component Value Date/Time   PROT 7.2 03/02/2023 1552   PROT 7.2 04/23/2022 1456   ALBUMIN  4.3 03/19/2023 0000   ALBUMIN  4.2 04/23/2022 1456   AST 31 03/19/2023 0000   ALT 22 03/19/2023 0000   ALKPHOS 101 03/19/2023 0000   BILITOT 0.6 03/02/2023 1552   BILITOT 0.4 04/23/2022 1456   BILIDIR 0.2 03/19/2023 0000   BILIDIR 0.10 11/03/2016 1155   IBILI 0.2 11/14/2019 1537      Component Value Date/Time   TSH 1.50 03/19/2023 0000   TSH 1.06 03/02/2023 1552   Nutritional Lab Results  Component Value Date   VD25OH 49.2 11/10/2023   VD25OH 36.7 08/06/2023   VD25OH 90.6 03/19/2023    Attestations:   I, Feliciano Mingle, acting as a stage manager for Meghan Jenkins, DO., have compiled all relevant documentation for today's office visit on behalf of Meghan Jenkins, DO, while in the presence of [employer], DO.   I have reviewed the above documentation for accuracy and completeness, and I agree with the above. Meghan JINNY Adams, D.O.  The 21st Century Cures Act was signed into law in 2016 which includes the topic of electronic health records.  This provides immediate access to information in MyChart.  This includes consultation notes, operative  notes, office notes, lab results and pathology reports.  If you have any questions about what you read please let us  know at your next visit so we can discuss your concerns and take corrective action if need be.  We are right here with you.

## 2024-01-21 ENCOUNTER — Inpatient Hospital Stay: Admission: RE | Admit: 2024-01-21 | Source: Ambulatory Visit

## 2024-01-25 ENCOUNTER — Encounter: Payer: Self-pay | Admitting: Radiology

## 2024-01-27 ENCOUNTER — Encounter: Payer: Self-pay | Admitting: Internal Medicine

## 2024-01-27 ENCOUNTER — Ambulatory Visit (INDEPENDENT_AMBULATORY_CARE_PROVIDER_SITE_OTHER): Admitting: Internal Medicine

## 2024-01-27 VITALS — BP 116/86 | HR 70 | Temp 97.7°F | Resp 16 | Ht 64.5 in | Wt 210.0 lb

## 2024-01-27 DIAGNOSIS — M2042 Other hammer toe(s) (acquired), left foot: Secondary | ICD-10-CM | POA: Insufficient documentation

## 2024-01-27 DIAGNOSIS — Z2911 Encounter for prophylactic immunotherapy for respiratory syncytial virus (RSV): Secondary | ICD-10-CM

## 2024-01-27 DIAGNOSIS — F5104 Psychophysiologic insomnia: Secondary | ICD-10-CM

## 2024-01-27 DIAGNOSIS — R350 Frequency of micturition: Secondary | ICD-10-CM | POA: Insufficient documentation

## 2024-01-27 DIAGNOSIS — M1991 Primary osteoarthritis, unspecified site: Secondary | ICD-10-CM | POA: Insufficient documentation

## 2024-01-27 DIAGNOSIS — M2041 Other hammer toe(s) (acquired), right foot: Secondary | ICD-10-CM | POA: Insufficient documentation

## 2024-01-27 DIAGNOSIS — E039 Hypothyroidism, unspecified: Secondary | ICD-10-CM

## 2024-01-27 DIAGNOSIS — D649 Anemia, unspecified: Secondary | ICD-10-CM | POA: Diagnosis not present

## 2024-01-27 DIAGNOSIS — K760 Fatty (change of) liver, not elsewhere classified: Secondary | ICD-10-CM | POA: Insufficient documentation

## 2024-01-27 DIAGNOSIS — D539 Nutritional anemia, unspecified: Secondary | ICD-10-CM | POA: Diagnosis not present

## 2024-01-27 DIAGNOSIS — E785 Hyperlipidemia, unspecified: Secondary | ICD-10-CM

## 2024-01-27 DIAGNOSIS — Z79899 Other long term (current) drug therapy: Secondary | ICD-10-CM | POA: Insufficient documentation

## 2024-01-27 DIAGNOSIS — Z23 Encounter for immunization: Secondary | ICD-10-CM

## 2024-01-27 DIAGNOSIS — M2012 Hallux valgus (acquired), left foot: Secondary | ICD-10-CM | POA: Insufficient documentation

## 2024-01-27 DIAGNOSIS — R7989 Other specified abnormal findings of blood chemistry: Secondary | ICD-10-CM | POA: Insufficient documentation

## 2024-01-27 DIAGNOSIS — R3911 Hesitancy of micturition: Secondary | ICD-10-CM | POA: Insufficient documentation

## 2024-01-27 LAB — CBC WITH DIFFERENTIAL/PLATELET
Basophils Absolute: 0.1 K/uL (ref 0.0–0.1)
Basophils Relative: 1.7 % (ref 0.0–3.0)
Eosinophils Absolute: 0.1 K/uL (ref 0.0–0.7)
Eosinophils Relative: 3.4 % (ref 0.0–5.0)
HCT: 40.9 % (ref 36.0–46.0)
Hemoglobin: 13.4 g/dL (ref 12.0–15.0)
Lymphocytes Relative: 15.7 % (ref 12.0–46.0)
Lymphs Abs: 0.5 K/uL — ABNORMAL LOW (ref 0.7–4.0)
MCHC: 32.9 g/dL (ref 30.0–36.0)
MCV: 99.6 fl (ref 78.0–100.0)
Monocytes Absolute: 0.5 K/uL (ref 0.1–1.0)
Monocytes Relative: 16.6 % — ABNORMAL HIGH (ref 3.0–12.0)
Neutro Abs: 2 K/uL (ref 1.4–7.7)
Neutrophils Relative %: 62.6 % (ref 43.0–77.0)
Platelets: 200 K/uL (ref 150.0–400.0)
RBC: 4.1 Mil/uL (ref 3.87–5.11)
RDW: 16.8 % — ABNORMAL HIGH (ref 11.5–15.5)
WBC: 3.2 K/uL — ABNORMAL LOW (ref 4.0–10.5)

## 2024-01-27 LAB — HEPATIC FUNCTION PANEL
ALT: 8 U/L (ref 0–35)
AST: 18 U/L (ref 0–37)
Albumin: 3.8 g/dL (ref 3.5–5.2)
Alkaline Phosphatase: 84 U/L (ref 39–117)
Bilirubin, Direct: 0.1 mg/dL (ref 0.0–0.3)
Total Bilirubin: 0.5 mg/dL (ref 0.2–1.2)
Total Protein: 7.2 g/dL (ref 6.0–8.3)

## 2024-01-27 LAB — LIPID PANEL
Cholesterol: 130 mg/dL (ref 0–200)
HDL: 55.2 mg/dL (ref >39.00)
LDL Cholesterol: 65 mg/dL (ref 0–99)
NonHDL: 74.59
Total CHOL/HDL Ratio: 2
Triglycerides: 50 mg/dL (ref 0.0–149.0)
VLDL: 10 mg/dL (ref 0.0–40.0)

## 2024-01-27 MED ORDER — BOOSTRIX 5-2.5-18.5 LF-MCG/0.5 IM SUSP: 0.5000 mL | Freq: Once | INTRAMUSCULAR | 0 refills | Status: AC

## 2024-01-27 MED ORDER — BOOSTRIX 5-2.5-18.5 LF-MCG/0.5 IM SUSP: 0.5000 mL | Freq: Once | INTRAMUSCULAR | 0 refills | Status: DC

## 2024-01-27 MED ORDER — BELSOMRA 15 MG PO TABS
15.0000 mg | ORAL_TABLET | Freq: Every evening | ORAL | 0 refills | Status: AC | PRN
Start: 2024-01-27 — End: ?

## 2024-01-27 MED ORDER — AREXVY 120 MCG/0.5ML IM SUSR: 0.5000 mL | Freq: Once | INTRAMUSCULAR | 0 refills | Status: AC

## 2024-01-27 MED ORDER — AREXVY 120 MCG/0.5ML IM SUSR: 0.5000 mL | Freq: Once | INTRAMUSCULAR | 0 refills | Status: DC

## 2024-01-27 NOTE — Patient Instructions (Signed)

## 2024-01-27 NOTE — Progress Notes (Signed)
 Subjective:  Patient ID: [name], female    DOB: November 20, 1955  Age: 68 y.o. MRN: [id number]  CC: Anemia, Hyperlipidemia, and Hypothyroidism   HPI [name] presents for f/up ---  Discussed the use of AI scribe software for clinical note transcription with the patient, who gave verbal consent to proceed.  History of Present Illness [name] is a 68 year old female with lupus who presents for evaluation of anemia.  She was recently diagnosed with anemia without a known history of anemia or blood loss. No symptoms typically associated with anemia, such as numbness, tingling, fatigue, or shortness of breath beyond her usual lupus symptoms. She recalls a surgery but was not informed of any blood loss during the procedure.  She experiences intermittent pain between her shoulder blades, which she attributes to a muscular cause. This pain is not new and has been present on and off, with today being particularly bad.  She has intentionally lost about 84 pounds under guidance for weight management. Her appetite has decreased, and she exercises at the gym two to three times a week. She feels positive about the weight loss and reports no issues with her thyroid  medication.  During a recent hospital stay, she experienced a drop in blood pressure, which she attributes to receiving a double dose of her blood pressure medication. She received a couple of boluses during that time and underwent EKGs.  She takes potassium and melatonin supplements, the latter to aid with sleep. She has been using melatonin for a couple of weeks and reports trouble with both falling and staying asleep. She is open to alternatives for sleep aid.  No abdominal pain, nausea, vomiting, blood in stool, or black, tarry stools. Bowel movements are regular, and she does not experience leg swelling or trouble swallowing. She has received both flu and COVID vaccines.     Outpatient Medications Prior to  Visit  Medication Sig Dispense Refill   albuterol  (VENTOLIN  HFA) 108 (90 Base) MCG/ACT inhaler Inhale 2 puffs into the lungs every 6 (six) hours as needed for wheezing or shortness of breath. 8 g 0   aspirin  81 MG chewable tablet Chew 1 tablet (81 mg total) by mouth 2 (two) times daily. 30 tablet 0   azaTHIOprine  (IMURAN ) 50 MG tablet TAKE 1 TABLET BY MOUTH IN THE MORNING AND IN THE EVENING 180 tablet 0   cyclobenzaprine  (FLEXERIL ) 5 MG tablet Take 1 tablet (5 mg total) by mouth 3 (three) times daily as needed for muscle spasms. 30 tablet 0   dapagliflozin  propanediol (FARXIGA ) 10 MG TABS tablet Take 1 tablet (10 mg total) by mouth daily. 90 tablet 3   fluticasone  (FLONASE ) 50 MCG/ACT nasal spray SPRAY 2 SPRAYS INTO EACH NOSTRIL EVERY DAY 48 mL 2   furosemide  (LASIX ) 20 MG tablet TAKE 1 TABLET BY MOUTH EVERY DAY 90 tablet 3   HYDROcodone -acetaminophen  (NORCO/VICODIN) 5-325 MG tablet Take 1-2 tablets by mouth 3 (three) times daily as needed for moderate pain (pain score 4-6). 30 tablet 0   hydroxychloroquine  (PLAQUENIL ) 200 MG tablet Take 200 mg by mouth 2 (two) times daily.     isosorbide  mononitrate (IMDUR ) 30 MG 24 hr tablet Take 1 tablet (30 mg total) by mouth daily. 90 tablet 3   metoprolol  succinate (TOPROL  XL) 25 MG 24 hr tablet Take 1 tablet (25 mg total) by mouth daily. 90 tablet 3   nitroGLYCERIN  (NITROSTAT ) 0.4 MG SL tablet Place 1 tablet (0.4 mg total) under the  tongue every 5 (five) minutes as needed for chest pain. 25 tablet 3   pantoprazole  (PROTONIX ) 40 MG tablet Take 1 tablet (40 mg total) by mouth daily. 90 tablet 3   polyethylene glycol powder (GLYCOLAX /MIRALAX ) 17 GM/SCOOP powder 17 gram po BID prn constipation (Patient taking differently: Take 17 g by mouth daily as needed for mild constipation or moderate constipation.) 850 g 0   potassium chloride  SA (KLOR-CON  M) 20 MEQ tablet Take 1 tablet (20 mEq total) by mouth daily. 30 tablet 0   rosuvastatin  (CRESTOR ) 20 MG tablet Take 1  tablet (20 mg total) by mouth daily. 90 tablet 3   tirzepatide  (MOUNJARO ) 7.5 MG/0.5ML Pen Inject 7.5 mg into the skin once a week. 2 mL 0   Vitamin D , Ergocalciferol , (DRISDOL ) 1.25 MG (50000 UNIT) CAPS capsule Take 50,000 units by mouth every 10 days 9 capsule 0   levothyroxine  (SYNTHROID ) 75 MCG tablet Take 1 tablet (75 mcg total) by mouth daily before breakfast. 90 tablet 0   losartan  (COZAAR ) 25 MG tablet Take 0.5 tablets (12.5 mg total) by mouth daily. (Patient not taking: Reported on 01/27/2024) 45 tablet 2   No facility-administered medications prior to visit.    ROS Review of Systems  Constitutional:  Negative for appetite change, chills, diaphoresis, fatigue and fever.  HENT: Negative.    Eyes: Negative.  Negative for visual disturbance.  Respiratory: Negative.  Negative for cough, chest tightness, shortness of breath and wheezing.   Cardiovascular:  Negative for chest pain, palpitations and leg swelling.  Gastrointestinal: Negative.  Negative for abdominal pain, constipation, diarrhea, nausea and vomiting.  Endocrine: Negative.   Genitourinary: Negative.  Negative for difficulty urinating.  Musculoskeletal:  Positive for arthralgias and back pain. Negative for joint swelling and myalgias.  Skin: Negative.   Neurological: Negative.  Negative for dizziness, weakness and light-headedness.  Hematological:  Negative for adenopathy. Does not bruise/bleed easily.  Psychiatric/Behavioral:  Positive for sleep disturbance. The patient is not hyperactive.     Objective:  BP 116/86 (BP Location: Left Arm, Patient Position: Sitting, Cuff Size: Normal)   Pulse 70   Temp 97.7 F (36.5 C) (Oral)   Resp 16   Ht 5' 4.5 (1.638 m)   Wt 210 lb (95.3 kg)   SpO2 99%   BMI 35.49 kg/m   BP Readings from Last 3 Encounters:  01/27/24 116/86  01/18/24 128/83  12/22/23 119/72    Wt Readings from Last 3 Encounters:  01/27/24 210 lb (95.3 kg)  01/18/24 207 lb (93.9 kg)  12/22/23 210 lb  (95.3 kg)    Physical Exam Vitals reviewed.  Constitutional:      Appearance: Normal appearance.  HENT:     Nose: Nose normal.     Mouth/Throat:     Mouth: Mucous membranes are moist.  Eyes:     General: No scleral icterus.    Conjunctiva/sclera: Conjunctivae normal.  Cardiovascular:     Heart sounds: Murmur heard.     Systolic murmur is present with a grade of 1/6.     No friction rub. No gallop.  Pulmonary:     Effort: Pulmonary effort is normal.     Breath sounds: No stridor. No wheezing, rhonchi or rales.  Abdominal:     General: Abdomen is flat. Bowel sounds are normal.     Palpations: There is no mass.     Tenderness: There is no abdominal tenderness. There is no guarding.     Hernia: No hernia is present.  Musculoskeletal:     Cervical back: Neck supple.     Right lower leg: No edema.     Left lower leg: No edema.  Lymphadenopathy:     Cervical: No cervical adenopathy.  Skin:    General: Skin is warm and dry.     Coloration: Skin is not pale.  Neurological:     General: No focal deficit present.     Mental Status: She is alert.  Psychiatric:        Mood and Affect: Mood normal.     Lab Results  Component Value Date   WBC 3.2 (L) 01/27/2024   HGB 13.4 01/27/2024   HCT 40.9 01/27/2024   PLT 200.0 01/27/2024   GLUCOSE 113 (H) 12/02/2023   CHOL 130 01/27/2024   TRIG 50.0 01/27/2024   HDL 55.20 01/27/2024   LDLDIRECT 321.8 02/27/2012   LDLCALC 65 01/27/2024   ALT 8 01/27/2024   AST 18 01/27/2024   NA 140 12/02/2023   K 4.0 12/02/2023   CL 106 12/02/2023   CREATININE 1.06 (H) 12/02/2023   BUN 15 12/02/2023   CO2 25 12/02/2023   TSH 0.86 01/27/2024   INR 1.09 07/01/2017   HGBA1C 5.2 11/10/2023    MR Knee Left w/o contrast Result Date: 01/02/2024 MR KNEE WITHOUT IV CONTRAST LEFT COMPARISON: None. CLINICAL HISTORY: Chronic left knee pain. PULSE SEQUENCES: Ax PD FS, Sag T2 ACL, Sag PD FS, Cor PD FS & COR T1 FINDINGS: Bones: There is no accelerated  arthrosis. Subchondral cyst are identified in the lateral tibial plateau with overlying chondromalacia. No significant full-thickness cartilage defect is appreciated. There is moderate chondromalacia of the central patella. There are multifocal bone infarcts without evidence of subchondral collapse or significant acute edema. No significant joint effusion is present. The extensor mechanism is intact. Ligaments: The ACL, PCL, MCL and fibular collateral ligament are intact. Menisci: The medial and lateral menisci are unremarkable. IMPRESSION: Moderate chondromalacia of the central patella with subchondral reactive edema. Mild chondromalacia in the posterior lateral tibial plateau with subchondral cystic change. No accelerated arthrosis or significant effusion. Multiple bone infarct are present without subchondral collapse or acute edema pattern. No ligamentous or meniscal injury. Electronically signed by: Norleen Satchel MD 01/02/2024 12:53 PM EDT RP Workstation: [id number]    Assessment & Plan:   Acquired hypothyroidism- She is euthyroid. -     TSH; Future -     Levothyroxine  Sodium; Take 1 tablet (75 mcg total) by mouth daily before breakfast.  Dispense: 90 tablet; Refill: 0  Need for prophylactic vaccination with combined diphtheria-tetanus-pertussis (DTP) vaccine -     Boostrix ; Inject 0.5 mLs into the muscle once for 1 dose.  Dispense: 0.5 mL; Refill: 0  Need for RSV vaccination -     Arexvy ; Inject 0.5 mLs into the muscle once for 1 dose.  Dispense: 0.5 mL; Refill: 0  Hyperlipidemia LDL goal <70 -     Hepatic function panel; Future -     TSH; Future -     Lipid panel; Future  Deficiency anemia- H/H are normal. -     IBC + Ferritin; Future -     Vitamin B12; Future -     CBC with Differential/Platelet; Future -     Reticulocytes; Future -     Vitamin B1; Future -     Zinc; Future -     Folate; Future  Psychophysiological insomnia -     Belsomra; Take 1 tablet (15 mg total) by mouth at  bedtime as needed.  Dispense: 90 tablet; Refill: 0     Follow-up: Return in about 3 months (around 04/28/2024).  Debby Molt, MD

## 2024-01-28 ENCOUNTER — Ambulatory Visit: Payer: Self-pay | Admitting: Internal Medicine

## 2024-01-28 LAB — IBC + FERRITIN
Ferritin: 73.5 ng/mL (ref 10.0–291.0)
Iron: 68 ug/dL (ref 42–145)
Saturation Ratios: 21.2 % (ref 20.0–50.0)
TIBC: 320.6 ug/dL (ref 250.0–450.0)
Transferrin: 229 mg/dL (ref 212.0–360.0)

## 2024-01-28 LAB — VITAMIN B12: Vitamin B-12: 548 pg/mL (ref 211–911)

## 2024-01-28 LAB — FOLATE: Folate: 8.1 ng/mL (ref >5.9)

## 2024-01-28 LAB — TSH: TSH: 0.86 u[IU]/mL (ref 0.35–5.50)

## 2024-01-28 MED ORDER — LEVOTHYROXINE SODIUM 75 MCG PO TABS: 75.0000 ug | ORAL_TABLET | Freq: Every day | ORAL | 0 refills | Status: DC

## 2024-02-02 LAB — RETICULOCYTES
ABS Retic: 59850 {cells}/uL (ref 20000–80000)
Retic Ct Pct: 1.5 %

## 2024-02-02 LAB — ZINC: Zinc: 59 ug/dL — ABNORMAL LOW (ref 60–130)

## 2024-02-02 LAB — VITAMIN B1: Vitamin B1 (Thiamine): 8 nmol/L (ref 8–30)

## 2024-02-04 ENCOUNTER — Other Ambulatory Visit: Payer: Self-pay | Admitting: Physician Assistant

## 2024-02-08 ENCOUNTER — Ambulatory Visit: Admitting: Orthopaedic Surgery

## 2024-02-15 ENCOUNTER — Other Ambulatory Visit: Payer: Self-pay | Admitting: Physician Assistant

## 2024-02-16 ENCOUNTER — Other Ambulatory Visit: Payer: Self-pay | Admitting: Cardiology

## 2024-02-20 ENCOUNTER — Other Ambulatory Visit: Payer: Self-pay | Admitting: Internal Medicine

## 2024-02-20 DIAGNOSIS — E039 Hypothyroidism, unspecified: Secondary | ICD-10-CM

## 2024-02-22 ENCOUNTER — Telehealth: Payer: Self-pay | Admitting: Cardiology

## 2024-02-22 NOTE — Telephone Encounter (Signed)
This has already been sent in today

## 2024-02-22 NOTE — Telephone Encounter (Signed)
*  STAT* If patient is at the pharmacy, call can be transferred to refill team.   1. Which medications need to be refilled? (please list name of each medication and dose if known) dapagliflozin  propanediol (FARXIGA ) 10 MG TABS tablet    2. Would you like to learn more about the convenience, safety, & potential cost savings by using the [facility]? no   3. Are you open to using the [facility] (Type [facility]. ). No    4. Which pharmacy/location (including street and city if local pharmacy) is medication to be sent to? [id number] [address] [place], [place] - [address]     5. Do they need a 30 day or 90 day supply? 90 day  Pt is out of medication

## 2024-02-24 ENCOUNTER — Ambulatory Visit (INDEPENDENT_AMBULATORY_CARE_PROVIDER_SITE_OTHER): Payer: Self-pay | Admitting: Family Medicine

## 2024-02-24 ENCOUNTER — Encounter (INDEPENDENT_AMBULATORY_CARE_PROVIDER_SITE_OTHER): Payer: Self-pay | Admitting: Family Medicine

## 2024-02-24 VITALS — BP 121/81 | HR 63 | Temp 98.1°F | Ht 64.5 in | Wt 207.0 lb

## 2024-02-24 DIAGNOSIS — Z6835 Body mass index (BMI) 35.0-35.9, adult: Secondary | ICD-10-CM

## 2024-02-24 DIAGNOSIS — E88819 Insulin resistance, unspecified: Secondary | ICD-10-CM

## 2024-02-24 DIAGNOSIS — E559 Vitamin D deficiency, unspecified: Secondary | ICD-10-CM

## 2024-02-24 DIAGNOSIS — I10 Essential (primary) hypertension: Secondary | ICD-10-CM

## 2024-02-24 MED ORDER — TIRZEPATIDE 7.5 MG/0.5ML ~~LOC~~ SOAJ: 7.5000 mg | SUBCUTANEOUS | 0 refills | Status: DC

## 2024-02-24 MED ORDER — VITAMIN D (ERGOCALCIFEROL) 1.25 MG (50000 UNIT) PO CAPS: ORAL_CAPSULE | ORAL | 0 refills | Status: AC

## 2024-02-24 NOTE — Progress Notes (Signed)
 [name], D.O.  ABFM, ABOM Specializing in Clinical Bariatric Medicine  Office located at: [address] [place], [place]  [address]    FOR THE CHRONIC DISEASE OF OBESITY:   Obesity (HCC)-starting bmi 41.64 BMI 35.0-35.9,adult, current BMI 35  Weight Summary and Body Composition Analysis  Weight Lost Since Last Visit: 0  Weight Gained Since Last Visit: 0    Vitals Temp: 98.1 F (36.7 C) BP: 121/81 Pulse Rate: 63 SpO2: 96 %   Anthropometric Measurements Height: 5' 4.5 (1.638 m) Weight: 207 lb (93.9 kg) BMI (Calculated): 35 Weight at Last Visit: 207 lb Weight Lost Since Last Visit: 0 Weight Gained Since Last Visit: 0 Starting Weight: 258 lb Total Weight Loss (lbs): 51 lb (23.1 kg)   Body Composition  Body Fat %: 48.8 % Fat Mass (lbs): 101.4 lbs Muscle Mass (lbs): 100.8 lbs Total Body Water (lbs): 72.8 lbs Visceral Fat Rating : 15   Other Clinical Data Fasting: No Labs: No Today's Visit #: 34 Starting Date: 04/09/21    Chief complaint: Obesity  Interval History [name] is here for a follow-up office visit to discuss her progress with her obesity treatment plan. She is on the Category 1 Plan + journaling and states she is following her eating plan approximately 85 % of the time. She is doing cardio and weight training 60  minutes 3 days per week  She has experienced no weight change since last OV on 01/18/2024.   Her dietary and life habits include:  - Tracking Calories/Macros: yes  - Eating More Whole Foods: yes  - Adequate Protein Intake: yes - primarily eats turkey and chicken  - Adequate Water Intake: yes  - Skipping Meals: no  - Sleeping 7-9 Hours/ Night: yes    01/18/24 15:00 02/24/24 15:00   Body Fat % 49.3 % 48.8 %  Muscle Mass (lbs) 99.6 lbs 100.8 lbs  Fat Mass (lbs) 102.2 lbs 101.4 lbs  Total Body Water (lbs) 71.8 lbs 72.8 lbs  Visceral Fat Rating  15 15   Counseling done on how various foods will  affect these numbers and how to maximize success  Total Fat mass lost to date: - 31.2 lbs Total lbs lost to date:  - 51 lbs Total weight loss percentage to date: - 19.77 %   Nutritional and Behavioral Counseling:  We discussed the following today: continue to work on maintaining a reduced calorie state, getting the recommended amount of protein, incorporating whole foods, making healthy choices, staying well hydrated and practicing mindfulness when eating.  Additional resources provided today: Handout on December Goals  Evidence-based interventions for health behavior change were utilized today including the discussion of self monitoring techniques, problem-solving barriers and SMART goal setting techniques.   Regarding patient's less desirable eating habits and patterns, we employed the technique of small changes.   SMART Goal(s) created today: lose weight during the Holidays    Recommended Dietary Goals [name] is currently in the action stage of change. As such, her goal is to continue weight management plan.  She has agreed to continue the Cat 1 MP.   Recommended Physical Activity Goals [name] has been advised to work up to 300-450 minutes of moderate intensity aerobic activity a week and strengthening exercises 2-3 times per week for cardiovascular health, weight loss maintenance and preservation of muscle mass.   She was encouraged to Continue to gradually increase the amount and intensity of exercise routine   Medical Interventions and Pharmacotherapy  Previous Bariatric surgery: none  Pharmacotherapy: On Mounjaro  7.5 mg weekly with reported good compliance and tolerance. Good control of hunger, cravings, and food noise. She has been experiencing more constipation lately. Adequate daily water and fiber  intake encouraged along with activity/ movement. Pt also advised to being  MiraLAX  once daily or every other day titrated for one softer, normal caliber bowel movement each day.  Maintain with Mounjaro  at current dose (refill today).    OBESITY RELATED CONDITIONS ADDRESSED TODAY:    Medications Discontinued During This Encounter  Medication Reason   losartan  (COZAAR ) 25 MG tablet Patient Preference   Vitamin D , Ergocalciferol , (DRISDOL ) 1.25 MG (50000 UNIT) CAPS capsule Reorder   tirzepatide  (MOUNJARO ) 7.5 MG/0.5ML Pen Reorder     Meds ordered this encounter  Medications   Vitamin D , Ergocalciferol , (DRISDOL ) 1.25 MG (50000 UNIT) CAPS capsule    Sig: Take 50,000 units by mouth every 10 days    Dispense:  9 capsule    Refill:  0    60 d supply;  ** OV for RF **   Do not send RF request   tirzepatide  (MOUNJARO ) 7.5 MG/0.5ML Pen    Sig: Inject 7.5 mg into the skin once a week.    Dispense:  2 mL    Refill:  0     Insulin  resistance with polyphagia and carb cravings Assessment & Plan: Lab Results  Component Value Date   HGBA1C 5.2 11/10/2023   HGBA1C 5.3 03/19/2023   HGBA1C 5.2 04/23/2022   INSULIN  11.5 11/10/2023   INSULIN  8.3 09/11/2022   INSULIN  16.5 04/23/2022    I.R managed with dietary and lifestyle interventions. No complaints of excessive hunger or cravings. Continue working on nutrition plan to decrease simple carbohydrates, increase lean proteins and exercise to promote weight loss and improve glycemic control and prevent progression to Pre-DM.     Essential hypertension Assessment & Plan: BP Readings from Last 3 Encounters:  02/24/24 121/81  01/27/24 116/86  01/18/24 128/83   Blood pressure is at goal today. No acute concerns. Cont Imdur  30 mg daily, Metoprolol  25 mg daily, and Lasix  20 mg daily. Cont with low sodium diet, advance exercise as tolerated.    Vitamin D  deficiency Assessment & Plan: Lab Results  Component Value Date   VD25OH 49.2 11/10/2023   VD25OH 36.7 08/06/2023   VD25OH 90.6 03/19/2023   Currently on Ergocalciferol  50,000 units every 10 days with good compliance and tolerance. No acute concerns. Continue  regimen (refill today). Recheck as deemed medically necessary.    Objective:   PHYSICAL EXAM: Blood pressure 121/81, pulse 63, temperature 98.1 F (36.7 C), height 5' 4.5 (1.638 m), weight 207 lb (93.9 kg), SpO2 96%. Body mass index is 34.98 kg/m.  General: she is overweight, cooperative and in no acute distress. PSYCH: Has normal mood, affect and thought process.   HEENT: EOMI, sclerae are anicteric. Lungs: Normal breathing effort, no conversational dyspnea. Extremities: Moves * 4 Neurologic: A and O * 3, good insight  DIAGNOSTIC DATA REVIEWED: BMET    Component Value Date/Time   NA 140 12/02/2023 0429   NA 141 03/19/2023 0000   K 4.0 12/02/2023 0429   CL 106 12/02/2023 0429   CO2 25 12/02/2023 0429   GLUCOSE 113 (H) 12/02/2023 0429   BUN 15 12/02/2023 0429   BUN 18 03/19/2023 0000   CREATININE 1.06 (H) 12/02/2023 0429   CREATININE 1.17 (H) 02/22/2021 1501   CALCIUM  8.5 (L) 12/02/2023 0429   GFRNONAA 58 (  L) 12/02/2023 0429   GFRAA 52 (L) 08/04/2018 0921   Lab Results  Component Value Date   HGBA1C 5.2 11/10/2023   HGBA1C 5.6 01/31/2017   Lab Results  Component Value Date   INSULIN  11.5 11/10/2023   INSULIN  17.2 04/09/2021   Lab Results  Component Value Date   TSH 0.86 01/27/2024   CBC    Component Value Date/Time   WBC 3.2 (L) 01/27/2024 1409   RBC 4.10 01/27/2024 1409   HGB 13.4 01/27/2024 1409   HCT 40.9 01/27/2024 1409   PLT 200.0 01/27/2024 1409   MCV 99.6 01/27/2024 1409   MCH 32.8 12/02/2023 0429   MCHC 32.9 01/27/2024 1409   RDW 16.8 (H) 01/27/2024 1409   Iron Studies    Component Value Date/Time   IRON 68 01/27/2024 1409   IRON 386 06/23/2017 0000   TIBC 320.6 01/27/2024 1409   TIBC 285 06/23/2017 0000   FERRITIN 73.5 01/27/2024 1409   FERRITIN 58 06/23/2017 0000   IRONPCTSAT 21.2 01/27/2024 1409   IRONPCTSAT 26 06/23/2017 0000   Lipid Panel     Component Value Date/Time   CHOL 130 01/27/2024 1409   CHOL 134 04/23/2022 1456    TRIG 50.0 01/27/2024 1409   HDL 55.20 01/27/2024 1409   HDL 55 04/23/2022 1456   CHOLHDL 2 01/27/2024 1409   VLDL 10.0 01/27/2024 1409   LDLCALC 65 01/27/2024 1409   LDLCALC 67 04/23/2022 1456   LDLCALC 68 11/14/2019 1537   LDLDIRECT 321.8 02/27/2012 1202   Hepatic Function Panel     Component Value Date/Time   PROT 7.2 01/27/2024 1409   PROT 7.2 04/23/2022 1456   ALBUMIN  3.8 01/27/2024 1409   ALBUMIN  4.2 04/23/2022 1456   AST 18 01/27/2024 1409   ALT 8 01/27/2024 1409   ALKPHOS 84 01/27/2024 1409   BILITOT 0.5 01/27/2024 1409   BILITOT 0.4 04/23/2022 1456   BILIDIR 0.1 01/27/2024 1409   BILIDIR 0.10 11/03/2016 1155   IBILI 0.2 11/14/2019 1537      Component Value Date/Time   TSH 0.86 01/27/2024 1409   Nutritional Lab Results  Component Value Date   VD25OH 49.2 11/10/2023   VD25OH 36.7 08/06/2023   VD25OH 90.6 03/19/2023     Follow up:   Return 04/04/2024 at 2:00 PM.  She was informed of the importance of frequent follow up visits to maximize her success with intensive lifestyle modifications for her multiple health conditions.   Attestations:   I, Special Puri, acting as a stage manager for [employer], DO., have compiled all relevant documentation for today's office visit on behalf of Meghan Jenkins, DO, while in the presence of [employer], DO.  Pertinent positives were addressed with patient today. Reviewed by clinician on day of visit: allergies, medications, problem list, medical history, surgical history, family history, social history, and previous encounter notes.  I have reviewed the above documentation for accuracy and completeness, and I agree with the above. Meghan JINNY Adams, D.O.  The 21st Century Cures Act was signed into law in 2016 which includes the topic of electronic health records.  This provides immediate access to information in MyChart. This includes consultation notes, operative notes, office notes, lab results and pathology  reports.  If you have any questions about what you read please let us  know at your next visit so we can discuss your concerns and take corrective action if need be.  We are right here with you.

## 2024-02-25 ENCOUNTER — Telehealth (INDEPENDENT_AMBULATORY_CARE_PROVIDER_SITE_OTHER): Payer: Self-pay | Admitting: *Deleted

## 2024-02-25 NOTE — Telephone Encounter (Signed)
 Left message on patient's voicemail in reference to her prior authorization for Mounjaro . Without an A1c of 6.5 or higher they will not approve the Mounjaro .

## 2024-02-26 ENCOUNTER — Telehealth: Payer: Self-pay | Admitting: Orthopaedic Surgery

## 2024-02-26 NOTE — Telephone Encounter (Signed)
 Pt called saying that she was supposed to receive a prescription for a chair lift, but the company hasn't received the prescription and the pt is now being charged for it. She wants the prescription to be emailed to her. Email is [email or web address]. Call back number is [phone]. She would like a call back to know that this prescription has been sent.

## 2024-02-26 NOTE — Telephone Encounter (Signed)
 Spoke with patient. New Rx has been sent via email to patient. Company is [employer].  Advised patient if her or the company need any additional information to give us  a call and we will provide what we can.

## 2024-02-29 ENCOUNTER — Ambulatory Visit: Payer: Self-pay | Admitting: Internal Medicine

## 2024-02-29 ENCOUNTER — Encounter: Payer: Self-pay | Admitting: Internal Medicine

## 2024-02-29 ENCOUNTER — Ambulatory Visit: Admitting: Internal Medicine

## 2024-02-29 ENCOUNTER — Ambulatory Visit: Payer: Self-pay

## 2024-02-29 ENCOUNTER — Ambulatory Visit (INDEPENDENT_AMBULATORY_CARE_PROVIDER_SITE_OTHER)

## 2024-02-29 VITALS — BP 108/68 | HR 80 | Temp 99.2°F | Resp 16 | Ht 64.5 in | Wt 211.4 lb

## 2024-02-29 DIAGNOSIS — J069 Acute upper respiratory infection, unspecified: Secondary | ICD-10-CM

## 2024-02-29 DIAGNOSIS — R051 Acute cough: Secondary | ICD-10-CM

## 2024-02-29 DIAGNOSIS — R509 Fever, unspecified: Secondary | ICD-10-CM | POA: Insufficient documentation

## 2024-02-29 DIAGNOSIS — R0782 Intercostal pain: Secondary | ICD-10-CM

## 2024-02-29 DIAGNOSIS — N1831 Chronic kidney disease, stage 3a: Secondary | ICD-10-CM

## 2024-02-29 DIAGNOSIS — R911 Solitary pulmonary nodule: Secondary | ICD-10-CM | POA: Insufficient documentation

## 2024-02-29 DIAGNOSIS — J189 Pneumonia, unspecified organism: Secondary | ICD-10-CM | POA: Insufficient documentation

## 2024-02-29 LAB — POCT INFLUENZA A/B
Influenza A, POC: NEGATIVE
Influenza B, POC: NEGATIVE

## 2024-02-29 LAB — POC COVID19 BINAXNOW: SARS Coronavirus 2 Ag: NEGATIVE

## 2024-02-29 MED ORDER — HYDROCODONE BIT-HOMATROP MBR 5-1.5 MG/5ML PO SOLN: 5.0000 mL | Freq: Three times a day (TID) | ORAL | 0 refills | Status: AC | PRN

## 2024-02-29 MED ORDER — SULFAMETHOXAZOLE-TRIMETHOPRIM 800-160 MG PO TABS: 1.0000 | ORAL_TABLET | Freq: Two times a day (BID) | ORAL | 0 refills | Status: DC

## 2024-02-29 NOTE — Telephone Encounter (Signed)
 Call dropped, attempted to reach patient and disconnected call.  Copied from CRM [id number]. Topic: Clinical - Red Word Triage >> Feb 29, 2024  8:08 AM Donna BRAVO wrote: Red Word that prompted transfer to Nurse Triage:  -Fever 101.0 -chest pain -lupis -cardiac issues

## 2024-02-29 NOTE — Progress Notes (Unsigned)
 Subjective:  Patient ID: [name], female    DOB: May 26, 1955  Age: 68 y.o. MRN: [id number]  CC: Cough (Chest burning when coughing ), Sore Throat, and Fever (This morning patient reports having a fever of 101)   HPI [name] presents for f/up ----  Discussed the use of AI scribe software for clinical note transcription with the patient, who gave verbal consent to proceed.  History of Present Illness [name] is a 68 year old female who presents with a progressively worsening cough and chest pain.  She developed a cough three days ago, which has progressively worsened. The cough is productive of clear phlegm, with no blood. Alongside the cough, she experiences significant chest pain described as a burning sensation affecting her entire chest and throat.  She reports a fever that reached 101.34F, which has slightly decreased with showering and increased fluid intake. She has taken Tylenol  and Robitussin for symptom relief, which provided only temporary relief. She also experienced a runny nose for a couple of days prior to the onset of the cough.  No shortness of breath, but she feels weak, dizzy, and lightheaded. Initially, she thought her symptoms were due to allergies and took Benadryl , which only made her sleepy. She also mentions experiencing diarrhea, which she attributes to something she ate.  She states that a blood pressure of 108/68 is normal for her. She has been vaccinated against flu and COVID in October and August, respectively.  She is currently bird-sitting a friend's budgie, which she questions as a potential cause of her symptoms.    Outpatient Medications Prior to Visit  Medication Sig Dispense Refill   albuterol  (VENTOLIN  HFA) 108 (90 Base) MCG/ACT inhaler Inhale 2 puffs into the lungs every 6 (six) hours as needed for wheezing or shortness of breath. 8 g 0   aspirin  81 MG chewable tablet Chew 1 tablet (81 mg total) by mouth 2 (two)  times daily. 30 tablet 0   azaTHIOprine  (IMURAN ) 50 MG tablet TAKE 1 TABLET BY MOUTH IN THE MORNING AND IN THE EVENING 180 tablet 0   cyclobenzaprine  (FLEXERIL ) 5 MG tablet Take 1 tablet (5 mg total) by mouth 3 (three) times daily as needed for muscle spasms. 30 tablet 0   FARXIGA  10 MG TABS tablet TAKE 1 TABLET BY MOUTH EVERY DAY 90 tablet 3   fluticasone  (FLONASE ) 50 MCG/ACT nasal spray SPRAY 2 SPRAYS INTO EACH NOSTRIL EVERY DAY 48 mL 2   furosemide  (LASIX ) 20 MG tablet TAKE 1 TABLET BY MOUTH EVERY DAY 90 tablet 3   HYDROcodone -acetaminophen  (NORCO/VICODIN) 5-325 MG tablet Take 1-2 tablets by mouth 3 (three) times daily as needed for moderate pain (pain score 4-6). 30 tablet 0   hydroxychloroquine  (PLAQUENIL ) 200 MG tablet Take 200 mg by mouth 2 (two) times daily.     isosorbide  mononitrate (IMDUR ) 30 MG 24 hr tablet Take 1 tablet (30 mg total) by mouth daily. 90 tablet 3   levothyroxine  (SYNTHROID ) 75 MCG tablet TAKE 1 TABLET BY MOUTH DAILY BEFORE BREAKFAST. 90 tablet 0   metoprolol  succinate (TOPROL  XL) 25 MG 24 hr tablet Take 1 tablet (25 mg total) by mouth daily. 90 tablet 3   nitroGLYCERIN  (NITROSTAT ) 0.4 MG SL tablet Place 1 tablet (0.4 mg total) under the tongue every 5 (five) minutes as needed for chest pain. 25 tablet 3   pantoprazole  (PROTONIX ) 40 MG tablet TAKE 1 TABLET BY MOUTH EVERY DAY 90 tablet 3   polyethylene glycol powder (  GLYCOLAX /MIRALAX ) 17 GM/SCOOP powder 17 gram po BID prn constipation (Patient taking differently: Take 17 g by mouth daily as needed for mild constipation or moderate constipation.) 850 g 0   potassium chloride  SA (KLOR-CON  M) 20 MEQ tablet Take 1 tablet (20 mEq total) by mouth daily. 30 tablet 0   pregabalin  (LYRICA ) 50 MG capsule TAKE 1 CAPSULE (50 MG TOTAL) BY MOUTH 2 (TWO) TIMES DAILY AS NEEDED (PAIN). 60 capsule 1   rosuvastatin  (CRESTOR ) 20 MG tablet Take 1 tablet (20 mg total) by mouth daily. 90 tablet 3   Suvorexant  (BELSOMRA ) 15 MG TABS Take 1 tablet  (15 mg total) by mouth at bedtime as needed. 90 tablet 0   tirzepatide  (MOUNJARO ) 7.5 MG/0.5ML Pen Inject 7.5 mg into the skin once a week. 2 mL 0   Vitamin D , Ergocalciferol , (DRISDOL ) 1.25 MG (50000 UNIT) CAPS capsule Take 50,000 units by mouth every 10 days 9 capsule 0   No facility-administered medications prior to visit.    ROS Review of Systems  Objective:  BP 108/68 (BP Location: Left Arm, Patient Position: Sitting, Cuff Size: Normal)   Pulse 80   Temp 99.2 F (37.3 C) (Oral)   Resp 16   Ht 5' 4.5 (1.638 m)   Wt 211 lb 6.4 oz (95.9 kg)   SpO2 99%   BMI 35.73 kg/m   BP Readings from Last 3 Encounters:  02/29/24 108/68  02/24/24 121/81  01/27/24 116/86    Wt Readings from Last 3 Encounters:  02/29/24 211 lb 6.4 oz (95.9 kg)  02/24/24 207 lb (93.9 kg)  01/27/24 210 lb (95.3 kg)    Physical Exam Cardiovascular:     Heart sounds: Murmur heard.     Systolic murmur is present with a grade of 1/6.  Musculoskeletal:     Right lower leg: No edema.     Left lower leg: No edema.     Lab Results  Component Value Date   WBC 3.2 (L) 01/27/2024   HGB 13.4 01/27/2024   HCT 40.9 01/27/2024   PLT 200.0 01/27/2024   GLUCOSE 113 (H) 12/02/2023   CHOL 130 01/27/2024   TRIG 50.0 01/27/2024   HDL 55.20 01/27/2024   LDLDIRECT 321.8 02/27/2012   LDLCALC 65 01/27/2024   ALT 8 01/27/2024   AST 18 01/27/2024   NA 140 12/02/2023   K 4.0 12/02/2023   CL 106 12/02/2023   CREATININE 1.06 (H) 12/02/2023   BUN 15 12/02/2023   CO2 25 12/02/2023   TSH 0.86 01/27/2024   INR 1.09 07/01/2017   HGBA1C 5.2 11/10/2023    MR Knee Left w/o contrast Result Date: 01/02/2024 MR KNEE WITHOUT IV CONTRAST LEFT COMPARISON: None. CLINICAL HISTORY: Chronic left knee pain. PULSE SEQUENCES: Ax PD FS, Sag T2 ACL, Sag PD FS, Cor PD FS & COR T1 FINDINGS: Bones: There is no accelerated arthrosis. Subchondral cyst are identified in the lateral tibial plateau with overlying chondromalacia. No  significant full-thickness cartilage defect is appreciated. There is moderate chondromalacia of the central patella. There are multifocal bone infarcts without evidence of subchondral collapse or significant acute edema. No significant joint effusion is present. The extensor mechanism is intact. Ligaments: The ACL, PCL, MCL and fibular collateral ligament are intact. Menisci: The medial and lateral menisci are unremarkable. IMPRESSION: Moderate chondromalacia of the central patella with subchondral reactive edema. Mild chondromalacia in the posterior lateral tibial plateau with subchondral cystic change. No accelerated arthrosis or significant effusion. Multiple bone infarct are present without subchondral  collapse or acute edema pattern. No ligamentous or meniscal injury. Electronically signed by: Norleen Satchel MD 01/02/2024 12:53 PM EDT RP Workstation: [id number]    DG Chest 2 View Result Date: 02/29/2024 CLINICAL DATA:  Cough and fever. EXAM: CHEST - 2 VIEW COMPARISON:  01/24/2022 FINDINGS: Lungs are hyperexpanded. Subtle nodular density identified right lung base. Left lung clear. Cardiopericardial silhouette is at upper limits of normal for size. No acute bony abnormality. IMPRESSION: Subtle nodular density at the right lung base. CT chest without contrast recommended to further evaluate. These results will be called to the ordering clinician or representative by the Radiologist Assistant, and communication documented in the PACS or [employer]. Electronically Signed   By: Camellia Candle M.D.   On: 02/29/2024 12:40     CrCl cannot be calculated (Patient's most recent lab result is older than the maximum 21 days allowed.).   Assessment & Plan:  Acute cough -     DG Chest 2 View; Future -     POC COVID-19 BinaxNow -     POCT Influenza A/B  Fever, unspecified fever cause -     DG Chest 2 View; Future -     POC COVID-19 BinaxNow -     POCT Influenza A/B  Viral URI with cough -      HYDROcodone  Bit-Homatrop MBr; Take 5 mLs by mouth every 8 (eight) hours as needed for cough.  Dispense: 120 mL; Refill: 0  Pneumonia of right lower lobe due to infectious organism -     Sulfamethoxazole -Trimethoprim ; Take 1 tablet by mouth 2 (two) times daily for 7 days.  Dispense: 14 tablet; Refill: 0  Right lower lobe pulmonary nodule -     CT CHEST W CONTRAST; Future  Intercostal pain -     CT CHEST W CONTRAST; Future  Stage 3a chronic kidney disease (HCC) -     Basic metabolic panel with GFR; Future     Follow-up: No follow-ups on file.  Debby Molt, MD

## 2024-02-29 NOTE — Telephone Encounter (Signed)
 FYI Only or Action Required?: FYI only for provider: appointment scheduled on today.  Patient was last seen in primary care on 02/24/2024 by Midge Sober, DO.  Called Nurse Triage reporting Fever and Chest Pain.  Symptoms began several days ago.  Interventions attempted: OTC medications: Tylenol  and Rest, hydration, or home remedies.  Symptoms are: unchanged.  Triage Disposition: See HCP Within 4 Hours (Or PCP Triage)  Patient/caregiver understands and will follow disposition?: Yes   Reason for Disposition  [1] Fever > 101 F (38.3 C) AND [2] age > 60 years  Answer Assessment - Initial Assessment Questions Home Covid 19 test is negative   1. ONSET: When did the cough begin?      Couple days ago 2. SEVERITY: How bad is the cough today?      strong 3. SPUTUM: Describe the color of your sputum (e.g., none, dry cough; clear, white, yellow, green)     white 4. HEMOPTYSIS: Are you coughing up any blood? If Yes, ask: How much? (e.g., flecks, streaks, tablespoons, etc.)     Denies  5. DIFFICULTY BREATHING: Are you having difficulty breathing? If Yes, ask: How bad is it? (e.g., mild, moderate, severe)      Denies  6. FEVER: Do you have a fever? If Yes, ask: What is your temperature, how was it measured, and when did it start?     101.0  7. CARDIAC HISTORY: Do you have any history of heart disease? (e.g., heart attack, congestive heart failure)      yes 8. LUNG HISTORY: Do you have any history of lung disease?  (e.g., pulmonary embolus, asthma, emphysema)      9. PE RISK FACTORS: Do you have a history of blood clots? (or: recent major surgery, recent prolonged travel, bedridden)     denies 10. OTHER SYMPTOMS: Do you have any other symptoms? (e.g., runny nose, wheezing, chest pain)       Burning chest pain only when coughing, sore throat.  11. PREGNANCY: Is there any chance you are pregnant? When was your last menstrual period?        12. TRAVEL:  Have you traveled out of the country in the last month? (e.g., travel history, exposures)  Protocols used: Cough - Acute Productive-A-AH

## 2024-03-01 ENCOUNTER — Encounter: Payer: Self-pay | Admitting: Internal Medicine

## 2024-03-02 ENCOUNTER — Emergency Department (HOSPITAL_COMMUNITY)

## 2024-03-02 ENCOUNTER — Other Ambulatory Visit: Payer: Self-pay

## 2024-03-02 ENCOUNTER — Telehealth: Payer: Self-pay

## 2024-03-02 ENCOUNTER — Encounter (HOSPITAL_COMMUNITY): Payer: Self-pay

## 2024-03-02 ENCOUNTER — Observation Stay (HOSPITAL_COMMUNITY)
Admission: EM | Admit: 2024-03-02 | Discharge: 2024-03-03 | Disposition: A | Discharge status: Home / Self Care | Attending: Emergency Medicine | Admitting: Emergency Medicine

## 2024-03-02 DIAGNOSIS — L932 Other local lupus erythematosus: Secondary | ICD-10-CM | POA: Insufficient documentation

## 2024-03-02 DIAGNOSIS — E66811 Obesity, class 1: Secondary | ICD-10-CM | POA: Diagnosis not present

## 2024-03-02 DIAGNOSIS — Z6833 Body mass index (BMI) 33.0-33.9, adult: Secondary | ICD-10-CM | POA: Diagnosis not present

## 2024-03-02 DIAGNOSIS — I2510 Atherosclerotic heart disease of native coronary artery without angina pectoris: Secondary | ICD-10-CM | POA: Insufficient documentation

## 2024-03-02 DIAGNOSIS — I5022 Chronic systolic (congestive) heart failure: Secondary | ICD-10-CM | POA: Diagnosis not present

## 2024-03-02 DIAGNOSIS — R7303 Prediabetes: Secondary | ICD-10-CM | POA: Diagnosis not present

## 2024-03-02 DIAGNOSIS — R079 Chest pain, unspecified: Secondary | ICD-10-CM | POA: Diagnosis present

## 2024-03-02 DIAGNOSIS — K219 Gastro-esophageal reflux disease without esophagitis: Secondary | ICD-10-CM | POA: Insufficient documentation

## 2024-03-02 DIAGNOSIS — G4733 Obstructive sleep apnea (adult) (pediatric): Secondary | ICD-10-CM | POA: Insufficient documentation

## 2024-03-02 DIAGNOSIS — Z7982 Long term (current) use of aspirin: Secondary | ICD-10-CM | POA: Insufficient documentation

## 2024-03-02 DIAGNOSIS — I110 Hypertensive heart disease with heart failure: Secondary | ICD-10-CM | POA: Diagnosis not present

## 2024-03-02 DIAGNOSIS — E039 Hypothyroidism, unspecified: Secondary | ICD-10-CM | POA: Diagnosis not present

## 2024-03-02 DIAGNOSIS — Z79899 Other long term (current) drug therapy: Secondary | ICD-10-CM | POA: Diagnosis not present

## 2024-03-02 DIAGNOSIS — R0789 Other chest pain: Secondary | ICD-10-CM | POA: Diagnosis not present

## 2024-03-02 DIAGNOSIS — E785 Hyperlipidemia, unspecified: Secondary | ICD-10-CM | POA: Insufficient documentation

## 2024-03-02 LAB — CBC
HCT: 39.9 % (ref 36.0–46.0)
HCT: 39.4 % (ref 36.0–46.0)
Hemoglobin: 13.4 g/dL (ref 12.0–15.0)
Hemoglobin: 13.3 g/dL (ref 12.0–15.0)
MCH: 33.4 pg (ref 26.0–34.0)
MCH: 33.3 pg (ref 26.0–34.0)
MCHC: 33.6 g/dL (ref 30.0–36.0)
MCHC: 33.8 g/dL (ref 30.0–36.0)
MCV: 99.5 fL (ref 80.0–100.0)
MCV: 98.5 fL (ref 80.0–100.0)
Platelets: 198 K/uL (ref 150–400)
Platelets: 200 K/uL (ref 150–400)
RBC: 4.01 MIL/uL (ref 3.87–5.11)
RBC: 4 MIL/uL (ref 3.87–5.11)
RDW: 17.1 % — ABNORMAL HIGH (ref 11.5–15.5)
RDW: 16.9 % — ABNORMAL HIGH (ref 11.5–15.5)
WBC: 1.7 K/uL — ABNORMAL LOW (ref 4.0–10.5)
WBC: 1.5 K/uL — ABNORMAL LOW (ref 4.0–10.5)
nRBC: 0 % (ref 0.0–0.2)
nRBC: 0 % (ref 0.0–0.2)

## 2024-03-02 LAB — TROPONIN T, HIGH SENSITIVITY
Troponin T High Sensitivity: 26 ng/L — ABNORMAL HIGH (ref 0–19)
Troponin T High Sensitivity: 24 ng/L — ABNORMAL HIGH (ref 0–19)

## 2024-03-02 LAB — BASIC METABOLIC PANEL WITH GFR
Anion gap: 11 (ref 5–15)
BUN: 20 mg/dL (ref 8–23)
CO2: 24 mmol/L (ref 22–32)
Calcium: 8.7 mg/dL — ABNORMAL LOW (ref 8.9–10.3)
Chloride: 101 mmol/L (ref 98–111)
Creatinine, Ser: 1.45 mg/dL — ABNORMAL HIGH (ref 0.44–1.00)
GFR, Estimated: 39 mL/min — ABNORMAL LOW (ref >60)
Glucose, Bld: 82 mg/dL (ref 70–99)
Potassium: 3.9 mmol/L (ref 3.5–5.1)
Sodium: 137 mmol/L (ref 135–145)

## 2024-03-02 LAB — CREATININE, SERUM
Creatinine, Ser: 1.34 mg/dL — ABNORMAL HIGH (ref 0.44–1.00)
GFR, Estimated: 43 mL/min — ABNORMAL LOW (ref >60)

## 2024-03-02 MED ORDER — ACETAMINOPHEN 500 MG PO TABS: 500.0000 mg | ORAL_TABLET | Freq: Four times a day (QID) | ORAL | Status: DC | PRN

## 2024-03-02 MED ORDER — OXYCODONE HCL 5 MG PO TABS: 5.0000 mg | ORAL_TABLET | ORAL | Status: DC | PRN

## 2024-03-02 MED ORDER — MORPHINE SULFATE (PF) 2 MG/ML IV SOLN: 2.0000 mg | INTRAVENOUS | Status: DC | PRN

## 2024-03-02 MED ORDER — IOHEXOL 350 MG/ML SOLN
60.0000 mL | Freq: Once | INTRAVENOUS | Status: AC | PRN
  Administered 2024-03-02: 60 mL via INTRAVENOUS

## 2024-03-02 MED ORDER — ONDANSETRON HCL 4 MG/2ML IJ SOLN
4.0000 mg | Freq: Once | INTRAMUSCULAR | Status: AC
  Administered 2024-03-02: 4 mg via INTRAVENOUS
  Filled 2024-03-02: qty 2

## 2024-03-02 MED ORDER — MELATONIN 5 MG PO TABS: 5.0000 mg | ORAL_TABLET | Freq: Every evening | ORAL | Status: DC | PRN

## 2024-03-02 MED ORDER — FENTANYL CITRATE (PF) 50 MCG/ML IJ SOSY
50.0000 ug | PREFILLED_SYRINGE | Freq: Once | INTRAMUSCULAR | Status: DC
  Filled 2024-03-02: qty 1

## 2024-03-02 MED ORDER — ENOXAPARIN SODIUM 40 MG/0.4ML IJ SOSY
40.0000 mg | PREFILLED_SYRINGE | INTRAMUSCULAR | Status: DC
  Administered 2024-03-02: 40 mg via SUBCUTANEOUS
  Filled 2024-03-02: qty 0.4

## 2024-03-02 MED ORDER — POLYETHYLENE GLYCOL 3350 17 G PO PACK: 17.0000 g | PACK | Freq: Every day | ORAL | Status: DC | PRN

## 2024-03-02 MED ORDER — PROCHLORPERAZINE EDISYLATE 10 MG/2ML IJ SOLN
5.0000 mg | Freq: Four times a day (QID) | INTRAMUSCULAR | Status: DC | PRN
  Administered 2024-03-03: 5 mg via INTRAVENOUS
  Filled 2024-03-02: qty 2

## 2024-03-02 NOTE — ED Notes (Signed)
 Patient stated her cards physician said to call him when she has heart issues and come to the hospital, I spoke to Dr Armenta.

## 2024-03-02 NOTE — ED Triage Notes (Addendum)
 Patient has middle chest pain, nausea, light headedness that began today. Yesterday was diagnosed with pneumonia and taking antibiotics. Has had 3 heart attack and quadruple bypass.

## 2024-03-02 NOTE — H&P (Signed)
 History and Physical  [name] FMW:[id number] DOB: 05-08-1955 DOA: 03/02/2024  Referring physician: Dr. Patsey, EDP  PCP: Joshua Debby CROME, MD  Outpatient Specialists: Cardiology. Patient coming from: Home.  Chief Complaint: Chest pain.  HPI: [name] is a 68 y.o. female with medical history significant for coronary artery disease status post PCI with stenting, status post CABG, prediabetes, hyperlipidemia, GERD, chronic HFpEF, OSA not on CPAP, started on p.o. antibiotics yesterday for pneumonia, sulfamethoxazole -trimethoprim , who presents to the ER with complaints of persistent cough and exposure to a new pet, a parakeet, she has had for about 4 months.  Endorses subjective fevers but her fever is improving.  Intermittent right-sided chest pain, similar to chest pain that led to prior heart attack.  Her chest pain is worse with coughing.  In the ER, afebrile, O2 saturation 88% on room air.  Leukopenia 1.5 K.  CT angio chest negative for pulmonary embolism and no acute findings.  The patient endorses chest pain worse with coughing.  No evidence of acute ischemia on twelve-lead EKG.  High-sensitivity troponin 26, 24.  EDP requesting admission for further workup, to rule out ACS.  Admitted by [facility], hospitalist service.  ED Course: Temperature 98.8.  BP 108/63, pulse 100, respiration rate 18, O2 saturation 88% on room air.  Review of Systems: Review of systems as noted in the HPI. All other systems reviewed and are negative.   Past Medical History:  Diagnosis Date   Arthritis    Back pain    Bilateral swelling of feet and ankles    no current problems per pt 07/30/23   CAD (coronary artery disease)    a. remote stents/angioplasty. b. s/p CABG 01/2017 with mitral valve annuloplasty.   Chest pain    Constipation    Diastolic heart failure (HCC)    Esophageal reflux    Gallbladder problem    GERD (gastroesophageal reflux disease)    History of MI (myocardial  infarction)    Hyperlipidemia    Hypertension    Hypothyroidism    Immune thrombocytopenic purpura (HCC)    Joint pain    Lupus erythematosus    in remission since 2013   Mitral valve insufficiency and aortic valve insufficiency    Myocardial infarction (HCC)    x 2   Pneumonia    x several   PONV (postoperative nausea and vomiting)    Postoperative atrial fibrillation (HCC)    Pre-diabetes    Renal disease    stage 3   Severe mitral regurgitation    a. s/p MV annuloplasty 01/2017 at time of CABG.   Shortness of breath on exertion    no current problems - lost weight, stop smoking and hgb wnl in 02/2023   Sleep apnea    does not use CPAP - patient states mild, lost weight   Status post mitral valve annuloplasty    Unspecified disease of pericardium    Past Surgical History:  Procedure Laterality Date   BREAST BIOPSY Right 06/03/2023   MM RT BREAST BX W LOC DEV 1ST LESION IMAGE BX SPEC STEREO GUIDE 06/03/2023 [facility] MAMMOGRAPHY   CHOLECYSTECTOMY, LAPAROSCOPIC  2010   CORONARY ARTERY BYPASS GRAFT N/A 01/30/2017   Procedure: CORONARY ARTERY BYPASS GRAFTING (CABG), Times four  using the right saphaneous vein, harvested endoscopicly.  and left internal mammary artery .;  Surgeon: Lucas Dorise POUR, MD;  Location: [facility] OR;  Service: Open Heart Surgery;  Laterality: N/A;   LEFT HEART CATH AND CORONARY ANGIOGRAPHY  N/A 01/23/2017   Procedure: LEFT HEART CATH AND CORONARY ANGIOGRAPHY;  Surgeon: Claudene Victory ORN, MD;  Location: [facility] INVASIVE CV LAB;  Service: Cardiovascular;  Laterality: N/A;   MITRAL VALVE REPAIR N/A 01/30/2017   Procedure: MITRAL VALVE REPAIR (MVR);  Surgeon: Lucas Dorise POUR, MD;  Location: [facility] OR;  Service: Open Heart Surgery;  Laterality: N/A;   plastic surgical repair      dog bite to leg   RIGHT/LEFT HEART CATH AND CORONARY/GRAFT ANGIOGRAPHY N/A 02/26/2021   Procedure: RIGHT/LEFT HEART CATH AND CORONARY/GRAFT ANGIOGRAPHY;  Surgeon: Claudene Victory ORN, MD;  Location: [facility] INVASIVE CV  LAB;  Service: Cardiovascular;  Laterality: N/A;   SPLENECTOMY  5-04   TEE WITHOUT CARDIOVERSION N/A 01/27/2017   Procedure: TRANSESOPHAGEAL ECHOCARDIOGRAM (TEE);  Surgeon: Francyne Headland, MD;  Location: [facility] ENDOSCOPY;  Service: Cardiovascular;  Laterality: N/A;   TEE WITHOUT CARDIOVERSION N/A 01/30/2017   Procedure: TRANSESOPHAGEAL ECHOCARDIOGRAM (TEE);  Surgeon: Lucas Dorise POUR, MD;  Location: [facility] OR;  Service: Open Heart Surgery;  Laterality: N/A;   TOTAL HIP ARTHROPLASTY Right 08/11/2023   Procedure: ARTHROPLASTY, HIP, TOTAL, ANTERIOR APPROACH;  Surgeon: Vernetta Lonni GRADE, MD;  Location: [facility] OR;  Service: Orthopedics;  Laterality: Right;   TOTAL HIP ARTHROPLASTY Left 12/01/2023   Procedure: ARTHROPLASTY, LEFT HIP, TOTAL, ANTERIOR APPROACH;  Surgeon: Vernetta Lonni GRADE, MD;  Location: [facility] OR;  Service: Orthopedics;  Laterality: Left;   ULTRASOUND GUIDANCE FOR VASCULAR ACCESS  01/23/2017   Procedure: Ultrasound Guidance For Vascular Access;  Surgeon: Claudene Victory ORN, MD;  Location: [facility] INVASIVE CV LAB;  Service: Cardiovascular;;    Social History:  reports that she quit smoking about 8 years ago. Her smoking use included cigarettes. She started smoking about 37 years ago. She has a 30 pack-year smoking history. She has been exposed to tobacco smoke. She has never used smokeless tobacco. She reports that she does not drink alcohol and does not use drugs.   Allergies  Allergen Reactions   Amlodipine  Swelling    Leg edema   Cellcept  [Mycophenolate ] Nausea Only    Family History  Problem Relation Age of Onset   Heart disease Mother    Hyperlipidemia Mother    Cancer Mother        breast and cervical   Paget's disease of bone Mother    Fibromyalgia Mother    Breast cancer Mother    Hypertension Father    Heart disease Father        PVD   GER disease Father    Breast cancer Sister    Heart attack Maternal Grandmother    Cancer Paternal Grandfather        colon      Prior to  Admission medications   Medication Sig Start Date End Date Taking? Authorizing Provider  albuterol  (VENTOLIN  HFA) 108 (90 Base) MCG/ACT inhaler Inhale 2 puffs into the lungs every 6 (six) hours as needed for wheezing or shortness of breath. 02/22/21   Wendee Lynwood HERO, NP  aspirin  81 MG chewable tablet Chew 1 tablet (81 mg total) by mouth 2 (two) times daily. 12/03/23   Vernetta Lonni GRADE, MD  azaTHIOprine  (IMURAN ) 50 MG tablet TAKE 1 TABLET BY MOUTH IN THE MORNING AND IN THE EVENING 07/12/22   Joshua Debby CROME, MD  cyclobenzaprine  (FLEXERIL ) 5 MG tablet Take 1 tablet (5 mg total) by mouth 3 (three) times daily as needed for muscle spasms. 12/03/23   Vernetta Lonni GRADE, MD  FARXIGA  10 MG TABS tablet TAKE  1 TABLET BY MOUTH EVERY DAY 02/22/24   Tobb, Kardie, DO  fluticasone  (FLONASE ) 50 MCG/ACT nasal spray SPRAY 2 SPRAYS INTO EACH NOSTRIL EVERY DAY 12/01/22   Norleen Lynwood ORN, MD  furosemide  (LASIX ) 20 MG tablet TAKE 1 TABLET BY MOUTH EVERY DAY 04/14/23   Tobb, Kardie, DO  HYDROcodone  bit-homatropine (HYCODAN) 5-1.5 MG/5ML syrup Take 5 mLs by mouth every 8 (eight) hours as needed for cough. 02/29/24   Joshua Debby CROME, MD  HYDROcodone -acetaminophen  (NORCO/VICODIN) 5-325 MG tablet Take 1-2 tablets by mouth 3 (three) times daily as needed for moderate pain (pain score 4-6). 01/05/24   Gretta Bertrum ORN, PA-C  hydroxychloroquine  (PLAQUENIL ) 200 MG tablet Take 200 mg by mouth 2 (two) times daily.    [provider]  isosorbide  mononitrate (IMDUR ) 30 MG 24 hr tablet Take 1 tablet (30 mg total) by mouth daily. 03/20/23   Tobb, Kardie, DO  levothyroxine  (SYNTHROID ) 75 MCG tablet TAKE 1 TABLET BY MOUTH DAILY BEFORE BREAKFAST. 02/22/24   Joshua Debby CROME, MD  metoprolol  succinate (TOPROL  XL) 25 MG 24 hr tablet Take 1 tablet (25 mg total) by mouth daily. 12/22/23   Tobb, Kardie, DO  nitroGLYCERIN  (NITROSTAT ) 0.4 MG SL tablet Place 1 tablet (0.4 mg total) under the tongue every 5 (five) minutes as needed for chest  pain. 02/05/23   Tobb, Kardie, DO  pantoprazole  (PROTONIX ) 40 MG tablet TAKE 1 TABLET BY MOUTH EVERY DAY 02/05/24   Tobb, Kardie, DO  polyethylene glycol powder (GLYCOLAX /MIRALAX ) 17 GM/SCOOP powder 17 gram po BID prn constipation Patient taking differently: Take 17 g by mouth daily as needed for mild constipation or moderate constipation. 10/13/22   Opalski, Barnie, DO  potassium chloride  SA (KLOR-CON  M) 20 MEQ tablet Take 1 tablet (20 mEq total) by mouth daily. 02/27/21   Jerri Keys, MD  pregabalin  (LYRICA ) 50 MG capsule TAKE 1 CAPSULE (50 MG TOTAL) BY MOUTH 2 (TWO) TIMES DAILY AS NEEDED (PAIN). 02/15/24   Gretta Bertrum ORN, PA-C  rosuvastatin  (CRESTOR ) 20 MG tablet Take 1 tablet (20 mg total) by mouth daily. 03/20/23   Tobb, Kardie, DO  sulfamethoxazole -trimethoprim  (BACTRIM  DS) 800-160 MG tablet Take 1 tablet by mouth 2 (two) times daily for 7 days. 02/29/24 03/07/24  Joshua Debby CROME, MD  Suvorexant  (BELSOMRA ) 15 MG TABS Take 1 tablet (15 mg total) by mouth at bedtime as needed. 01/27/24   Joshua Debby CROME, MD  tirzepatide  (MOUNJARO ) 7.5 MG/0.5ML Pen Inject 7.5 mg into the skin once a week. 02/24/24   Opalski, Barnie, DO  Vitamin D , Ergocalciferol , (DRISDOL ) 1.25 MG (50000 UNIT) CAPS capsule Take 50,000 units by mouth every 10 days 02/24/24   Midge Barnie, DO    Physical Exam: BP 121/82   Pulse 67   Temp 98.8 F (37.1 C) (Oral)   Resp 15   Ht 5' 7 (1.702 m)   Wt 91.6 kg   SpO2 93%   BMI 31.64 kg/m   General: 68 y.o. year-old female well developed well nourished in no acute distress.  Alert and oriented x3. Cardiovascular: Regular rate and rhythm with no rubs or gallops.  No thyromegaly or JVD noted.  No lower extremity edema. 2/4 pulses in all 4 extremities. Respiratory: Clear to auscultation with no wheezes or rales. Good inspiratory effort. Abdomen: Soft nontender nondistended with normal bowel sounds x4 quadrants. Muskuloskeletal: No cyanosis, clubbing or edema noted  bilaterally Neuro: CN II-XII intact, strength, sensation, reflexes Skin: No ulcerative lesions noted or rashes Psychiatry: Judgement and insight appear  normal. Mood is appropriate for condition and setting          Labs on Admission:  Basic Metabolic Panel: Recent Labs  Lab 03/02/24 1633  NA 137  K 3.9  CL 101  CO2 24  GLUCOSE 82  BUN 20  CREATININE 1.45*  CALCIUM  8.7*   Liver Function Tests: No results for input(s): AST, ALT, ALKPHOS, BILITOT, PROT, ALBUMIN  in the last 168 hours. No results for input(s): LIPASE, AMYLASE in the last 168 hours. No results for input(s): AMMONIA in the last 168 hours. CBC: Recent Labs  Lab 03/02/24 1633  WBC 1.7*  HGB 13.4  HCT 39.9  MCV 99.5  PLT 198   Cardiac Enzymes: No results for input(s): CKTOTAL, CKMB, CKMBINDEX, TROPONINI in the last 168 hours.  BNP (last 3 results) No results for input(s): BNP in the last 8760 hours.  ProBNP (last 3 results) No results for input(s): PROBNP in the last 8760 hours.  CBG: No results for input(s): GLUCAP in the last 168 hours.  Radiological Exams on Admission: CT Angio Chest PE W and/or Wo Contrast Result Date: 03/02/2024 EXAM: CTA of the Chest with contrast for PE 03/02/2024 08:10:36 PM TECHNIQUE: CTA of the chest was performed without and with the administration of 60 mL of iohexol  (OMNIPAQUE ) 350 MG/ML injection. Multiplanar reformatted images are provided for review. MIP images are provided for review. Automated exposure control, iterative reconstruction, and/or weight based adjustment of the mA/kV was utilized to reduce the radiation dose to as low as reasonably achievable. COMPARISON: Chest x-ray 07/21/2023. CLINICAL HISTORY: Pulmonary embolism (PE) suspected, high prob. FINDINGS: PULMONARY ARTERIES: Pulmonary arteries are adequately opacified for evaluation. No pulmonary embolism. Main pulmonary artery is normal in caliber. MEDIASTINUM: Mitral annular valve  replacement. At least 3-vessel coronary artery calcifications. Mild cardiomegaly. The heart and pericardium demonstrate no acute abnormality. Thoracic aorta has normal caliber. Severe atherosclerotic plaque. There is no acute abnormality of the thoracic aorta. LYMPH NODES: No mediastinal, hilar or axillary lymphadenopathy. LUNGS AND PLEURA: Mosaic attenuation of the lung. Expiratory phase of respiration. Limited evaluation of the subsegmental level due to motion artifact. No focal consolidation or pulmonary edema. No pleural effusion or pneumothorax. UPPER ABDOMEN: Status post cholecystectomy. Small hiatal hernia. Limited images of the upper abdomen are unremarkable. SOFT TISSUES AND BONES: No acute bone or soft tissue abnormality. IMPRESSION: 1. No pulmonary embolism. Limited evaluation of the subsegmental pulmonary arteries due to motion artifact. 2. No acute findings. Electronically signed by: Morgane Naveau MD 03/02/2024 08:24 PM EST RP Workstation: [id number]   DG Chest 2 View Result Date: 03/02/2024 CLINICAL DATA:  Chest pain EXAM: CHEST - 2 VIEW COMPARISON:  02/29/2024 FINDINGS: Sternotomy. No acute airspace disease or pleural effusion. Previously noted nodular density at the radial base is less apparent. Normal cardiac size. IMPRESSION: No active cardiopulmonary disease. Previously noted nodular density at the right base is less apparent. Electronically Signed   By: Luke Bun M.D.   On: 03/02/2024 16:54    EKG: I independently viewed the EKG done and my findings are as followed: Sinus rhythm rate of 71.  QTc 470.  Assessment/Plan Present on Admission:  Chest pain  Active Problems:   Chest pain  Atypical chest pain rule out ACS Pain is right-sided and worse with coughing. No evidence of acute ischemia on 12-lead EKG High-sensitivity troponin flat 26, 24. Closely monitor on telemetry. Last transthoracic echocardiogram on 11/26/2023 Repeat echo. Consider cardiology consultation in the  morning.  Coronary artery disease status post  PCI and CABG Resume home regimen Monitor on telemetry.  Obesity BMI 31 Recommend weight loss outpatient with regular physical activity and healthy dieting.  Hypothyroidism Resume home regimen  GERD Resume home regimen  Lupus Resume home regimen    Time: 75 minutes.    DVT prophylaxis: Subcu Lovenox  daily.  Code Status: Full code.  Family Communication: Significant other is at bedside.  Disposition Plan: Admitted to telemetry unit.  Consults called: None.  Admission status: Observation status.   Status is: Observation    Terry LOISE Hurst MD [facility] Pager [phone]  If 7PM-7AM, please contact night-coverage [email or web address] Password [facility]  03/02/2024, 9:23 PM

## 2024-03-02 NOTE — Telephone Encounter (Signed)
 Copied from CRM #[id number]. Topic: Clinical - Medication Prior Auth >> Mar 02, 2024  3:58 PM Rea ORN wrote: Reason for CRM: Roselie with [facility] called to advise the pt has an appt tomorrow for CT but wanted to know if it required a prior auth. After speaking with CAL, I advised that a message would have to be sent to the referral coordinator who works off site. Roselie said she will have to cancel the appt if she can't get an answer today.    Please call Roselie as  [phone] ext [phone]

## 2024-03-02 NOTE — ED Provider Notes (Signed)
  EMERGENCY DEPARTMENT AT [facility] Provider Note   CSN: [id number] Arrival date & time: 03/02/24  1535     Patient presents with: Chest Pain   [name] is a 68 y.o. female.    Chest Pain Patient presents with chest pain.  Has had right-sided chest pain for the last few days.  Diagnosed with pneumonia yesterday.  Started on antibiotics.  Now with more pain.  States the pain today felt more like the pain when she had had her heart attack previously.  Had fevers yesterday.  Negative flu and COVID testing yesterday.    Past Medical History:  Diagnosis Date   Arthritis    Back pain    Bilateral swelling of feet and ankles    no current problems per pt 07/30/23   CAD (coronary artery disease)    a. remote stents/angioplasty. b. s/p CABG 01/2017 with mitral valve annuloplasty.   Chest pain    Constipation    Diastolic heart failure (HCC)    Esophageal reflux    Gallbladder problem    GERD (gastroesophageal reflux disease)    History of MI (myocardial infarction)    Hyperlipidemia    Hypertension    Hypothyroidism    Immune thrombocytopenic purpura (HCC)    Joint pain    Lupus erythematosus    in remission since 2013   Mitral valve insufficiency and aortic valve insufficiency    Myocardial infarction (HCC)    x 2   Pneumonia    x several   PONV (postoperative nausea and vomiting)    Postoperative atrial fibrillation (HCC)    Pre-diabetes    Renal disease    stage 3   Severe mitral regurgitation    a. s/p MV annuloplasty 01/2017 at time of CABG.   Shortness of breath on exertion    no current problems - lost weight, stop smoking and hgb wnl in 02/2023   Sleep apnea    does not use CPAP - patient states mild, lost weight   Status post mitral valve annuloplasty    Unspecified disease of pericardium     Prior to Admission medications   Medication Sig Start Date End Date Taking? Authorizing Provider  albuterol  (VENTOLIN  HFA) 108 (90  Base) MCG/ACT inhaler Inhale 2 puffs into the lungs every 6 (six) hours as needed for wheezing or shortness of breath. 02/22/21   Wendee Lynwood HERO, NP  aspirin  81 MG chewable tablet Chew 1 tablet (81 mg total) by mouth 2 (two) times daily. 12/03/23   Vernetta Lonni GRADE, MD  azaTHIOprine  (IMURAN ) 50 MG tablet TAKE 1 TABLET BY MOUTH IN THE MORNING AND IN THE EVENING 07/12/22   Joshua Debby CROME, MD  cyclobenzaprine  (FLEXERIL ) 5 MG tablet Take 1 tablet (5 mg total) by mouth 3 (three) times daily as needed for muscle spasms. 12/03/23   Vernetta Lonni GRADE, MD  FARXIGA  10 MG TABS tablet TAKE 1 TABLET BY MOUTH EVERY DAY 02/22/24   Tobb, Kardie, DO  fluticasone  (FLONASE ) 50 MCG/ACT nasal spray SPRAY 2 SPRAYS INTO EACH NOSTRIL EVERY DAY 12/01/22   Norleen Lynwood ORN, MD  furosemide  (LASIX ) 20 MG tablet TAKE 1 TABLET BY MOUTH EVERY DAY 04/14/23   Tobb, Kardie, DO  HYDROcodone  bit-homatropine (HYCODAN) 5-1.5 MG/5ML syrup Take 5 mLs by mouth every 8 (eight) hours as needed for cough. 02/29/24   Joshua Debby CROME, MD  HYDROcodone -acetaminophen  (NORCO/VICODIN) 5-325 MG tablet Take 1-2 tablets by mouth 3 (three) times daily as needed  for moderate pain (pain score 4-6). 01/05/24   Gretta Bertrum ORN, PA-C  hydroxychloroquine  (PLAQUENIL ) 200 MG tablet Take 200 mg by mouth 2 (two) times daily.    [provider]  isosorbide  mononitrate (IMDUR ) 30 MG 24 hr tablet Take 1 tablet (30 mg total) by mouth daily. 03/20/23   Tobb, Kardie, DO  levothyroxine  (SYNTHROID ) 75 MCG tablet TAKE 1 TABLET BY MOUTH DAILY BEFORE BREAKFAST. 02/22/24   Joshua Debby CROME, MD  metoprolol  succinate (TOPROL  XL) 25 MG 24 hr tablet Take 1 tablet (25 mg total) by mouth daily. 12/22/23   Tobb, Kardie, DO  nitroGLYCERIN  (NITROSTAT ) 0.4 MG SL tablet Place 1 tablet (0.4 mg total) under the tongue every 5 (five) minutes as needed for chest pain. 02/05/23   Tobb, Kardie, DO  pantoprazole  (PROTONIX ) 40 MG tablet TAKE 1 TABLET BY MOUTH EVERY DAY 02/05/24   Tobb,  Kardie, DO  polyethylene glycol powder (GLYCOLAX /MIRALAX ) 17 GM/SCOOP powder 17 gram po BID prn constipation Patient taking differently: Take 17 g by mouth daily as needed for mild constipation or moderate constipation. 10/13/22   Opalski, Barnie, DO  potassium chloride  SA (KLOR-CON  M) 20 MEQ tablet Take 1 tablet (20 mEq total) by mouth daily. 02/27/21   Jerri Keys, MD  pregabalin  (LYRICA ) 50 MG capsule TAKE 1 CAPSULE (50 MG TOTAL) BY MOUTH 2 (TWO) TIMES DAILY AS NEEDED (PAIN). 02/15/24   Gretta Bertrum ORN, PA-C  rosuvastatin  (CRESTOR ) 20 MG tablet Take 1 tablet (20 mg total) by mouth daily. 03/20/23   Tobb, Kardie, DO  sulfamethoxazole -trimethoprim  (BACTRIM  DS) 800-160 MG tablet Take 1 tablet by mouth 2 (two) times daily for 7 days. 02/29/24 03/07/24  Joshua Debby CROME, MD  Suvorexant  (BELSOMRA ) 15 MG TABS Take 1 tablet (15 mg total) by mouth at bedtime as needed. 01/27/24   Joshua Debby CROME, MD  tirzepatide  (MOUNJARO ) 7.5 MG/0.5ML Pen Inject 7.5 mg into the skin once a week. 02/24/24   Midge Barnie, DO  Vitamin D , Ergocalciferol , (DRISDOL ) 1.25 MG (50000 UNIT) CAPS capsule Take 50,000 units by mouth every 10 days 02/24/24   Midge Barnie, DO    Allergies: Amlodipine  and Cellcept  [mycophenolate ]    Review of Systems  Cardiovascular:  Positive for chest pain.    Updated Vital Signs BP 121/82   Pulse 67   Temp 98.8 F (37.1 C) (Oral)   Resp 15   Ht 5' 7 (1.702 m)   Wt 91.6 kg   SpO2 93%   BMI 31.64 kg/m   Physical Exam Vitals and nursing note reviewed.  Cardiovascular:     Rate and Rhythm: Normal rate.  Pulmonary:     Comments: Wheezes on right lung fields. Chest:     Chest wall: No tenderness.  Musculoskeletal:     Right lower leg: No edema.  Neurological:     Mental Status: She is alert.     (all labs ordered are listed, but only abnormal results are displayed) Labs Reviewed  BASIC METABOLIC PANEL WITH GFR - Abnormal; Notable for the following components:      Result  Value   Creatinine, Ser 1.45 (*)    Calcium  8.7 (*)    GFR, Estimated 39 (*)    All other components within normal limits  CBC - Abnormal; Notable for the following components:   WBC 1.7 (*)    RDW 17.1 (*)    All other components within normal limits  TROPONIN T, HIGH SENSITIVITY - Abnormal; Notable for the following components:  Troponin T High Sensitivity 26 (*)    All other components within normal limits  TROPONIN T, HIGH SENSITIVITY - Abnormal; Notable for the following components:   Troponin T High Sensitivity 24 (*)    All other components within normal limits    EKG: EKG Interpretation Date/Time:  Wednesday March 02 2024 16:32:01 EST Ventricular Rate:  71 PR Interval:  164 QRS Duration:  109 QT Interval:  432 QTC Calculation: 470 R Axis:   62  Text Interpretation: Sinus rhythm Multiple premature complexes, vent & supraven Low voltage, precordial leads Borderline T abnormalities, inferior leads Confirmed by Patsey Lot [phone]) on 03/02/2024 5:31:54 PM  Radiology: CT Angio Chest PE W and/or Wo Contrast Result Date: 03/02/2024 EXAM: CTA of the Chest with contrast for PE 03/02/2024 08:10:36 PM TECHNIQUE: CTA of the chest was performed without and with the administration of 60 mL of iohexol  (OMNIPAQUE ) 350 MG/ML injection. Multiplanar reformatted images are provided for review. MIP images are provided for review. Automated exposure control, iterative reconstruction, and/or weight based adjustment of the mA/kV was utilized to reduce the radiation dose to as low as reasonably achievable. COMPARISON: Chest x-ray 07/21/2023. CLINICAL HISTORY: Pulmonary embolism (PE) suspected, high prob. FINDINGS: PULMONARY ARTERIES: Pulmonary arteries are adequately opacified for evaluation. No pulmonary embolism. Main pulmonary artery is normal in caliber. MEDIASTINUM: Mitral annular valve replacement. At least 3-vessel coronary artery calcifications. Mild cardiomegaly. The heart and  pericardium demonstrate no acute abnormality. Thoracic aorta has normal caliber. Severe atherosclerotic plaque. There is no acute abnormality of the thoracic aorta. LYMPH NODES: No mediastinal, hilar or axillary lymphadenopathy. LUNGS AND PLEURA: Mosaic attenuation of the lung. Expiratory phase of respiration. Limited evaluation of the subsegmental level due to motion artifact. No focal consolidation or pulmonary edema. No pleural effusion or pneumothorax. UPPER ABDOMEN: Status post cholecystectomy. Small hiatal hernia. Limited images of the upper abdomen are unremarkable. SOFT TISSUES AND BONES: No acute bone or soft tissue abnormality. IMPRESSION: 1. No pulmonary embolism. Limited evaluation of the subsegmental pulmonary arteries due to motion artifact. 2. No acute findings. Electronically signed by: Morgane Naveau MD 03/02/2024 08:24 PM EST RP Workstation: [id number]   DG Chest 2 View Result Date: 03/02/2024 CLINICAL DATA:  Chest pain EXAM: CHEST - 2 VIEW COMPARISON:  02/29/2024 FINDINGS: Sternotomy. No acute airspace disease or pleural effusion. Previously noted nodular density at the radial base is less apparent. Normal cardiac size. IMPRESSION: No active cardiopulmonary disease. Previously noted nodular density at the right base is less apparent. Electronically Signed   By: Luke Bun M.D.   On: 03/02/2024 16:54     Procedures   Medications Ordered in the ED  fentaNYL  (SUBLIMAZE ) injection 50 mcg (50 mcg Intravenous Patient Refused/Not Given 03/02/24 1934)  ondansetron  (ZOFRAN ) injection 4 mg (4 mg Intravenous Given 03/02/24 1930)  iohexol  (OMNIPAQUE ) 350 MG/ML injection 60 mL (60 mLs Intravenous Contrast Given 03/02/24 2005)                                    Medical Decision Making Amount and/or Complexity of Data Reviewed Labs: ordered. Radiology: ordered.  Risk Prescription drug management.   Patient shortness of breath cough.  No fevers.  Recent pneumonia diagnosis.  Does  have lateralizing findings to right.  Fevers at home.  Negative flu and COVID test.  However also had chest pain like previous angina.  Initial EKG had some artifact but potential inferior changes.  On recheck was not there.  Initial troponin mildly elevated.  Will recheck.  Will also get CT angiography to evaluate for potential PE and further evaluate for infection.  Recheck troponin is stable.  CT angiography reassuring.  However does not show pneumonia.  I think likely respiratory cause of the right-sided chest pain however with pain that patient states felt like previous cardiac cause and troponins that are slightly elevated I think would benefit from admission to the hospital for further evaluation.  Will discuss with hospitalist.     Final diagnoses:  Chest pain, unspecified type    ED Discharge Orders     None          Patsey Lot, MD 03/02/24 2057

## 2024-03-03 ENCOUNTER — Other Ambulatory Visit

## 2024-03-03 ENCOUNTER — Observation Stay (HOSPITAL_BASED_OUTPATIENT_CLINIC_OR_DEPARTMENT_OTHER)

## 2024-03-03 DIAGNOSIS — R0789 Other chest pain: Secondary | ICD-10-CM | POA: Diagnosis not present

## 2024-03-03 DIAGNOSIS — R079 Chest pain, unspecified: Secondary | ICD-10-CM | POA: Diagnosis not present

## 2024-03-03 DIAGNOSIS — I10 Essential (primary) hypertension: Secondary | ICD-10-CM

## 2024-03-03 LAB — ECHOCARDIOGRAM COMPLETE
Area-P 1/2: 2.14 cm2
Calc EF: 42.2 %
Height: 67 in
MV VTI: 1.59 cm2
S' Lateral: 3.7 cm
Single Plane A2C EF: 39.8 %
Single Plane A4C EF: 45 %
Weight: 3428.59 [oz_av]

## 2024-03-03 LAB — BASIC METABOLIC PANEL WITH GFR
Anion gap: 13 (ref 5–15)
BUN: 21 mg/dL (ref 8–23)
CO2: 19 mmol/L — ABNORMAL LOW (ref 22–32)
Calcium: 8.7 mg/dL — ABNORMAL LOW (ref 8.9–10.3)
Chloride: 105 mmol/L (ref 98–111)
Creatinine, Ser: 1.35 mg/dL — ABNORMAL HIGH (ref 0.44–1.00)
GFR, Estimated: 43 mL/min — ABNORMAL LOW (ref >60)
Glucose, Bld: 67 mg/dL — ABNORMAL LOW (ref 70–99)
Potassium: 4.1 mmol/L (ref 3.5–5.1)
Sodium: 137 mmol/L (ref 135–145)

## 2024-03-03 LAB — CBC
HCT: 38.7 % (ref 36.0–46.0)
Hemoglobin: 13.1 g/dL (ref 12.0–15.0)
MCH: 33 pg (ref 26.0–34.0)
MCHC: 33.9 g/dL (ref 30.0–36.0)
MCV: 97.5 fL (ref 80.0–100.0)
Platelets: 194 K/uL (ref 150–400)
RBC: 3.97 MIL/uL (ref 3.87–5.11)
RDW: 16.7 % — ABNORMAL HIGH (ref 11.5–15.5)
WBC: 1.9 K/uL — ABNORMAL LOW (ref 4.0–10.5)
nRBC: 0 % (ref 0.0–0.2)

## 2024-03-03 LAB — MAGNESIUM: Magnesium: 2.5 mg/dL — ABNORMAL HIGH (ref 1.7–2.4)

## 2024-03-03 LAB — PHOSPHORUS: Phosphorus: 2.9 mg/dL (ref 2.5–4.6)

## 2024-03-03 LAB — HIV ANTIBODY (ROUTINE TESTING W REFLEX): HIV Screen 4th Generation wRfx: NONREACTIVE

## 2024-03-03 MED ORDER — LEVOTHYROXINE SODIUM 50 MCG PO TABS
75.0000 ug | ORAL_TABLET | Freq: Every day | ORAL | Status: DC
  Administered 2024-03-03: 75 ug via ORAL
  Filled 2024-03-03: qty 1

## 2024-03-03 MED ORDER — ROSUVASTATIN CALCIUM 20 MG PO TABS: 20.0000 mg | ORAL_TABLET | Freq: Every day | ORAL | Status: DC

## 2024-03-03 MED ORDER — IPRATROPIUM-ALBUTEROL 0.5-2.5 (3) MG/3ML IN SOLN
3.0000 mL | Freq: Three times a day (TID) | RESPIRATORY_TRACT | Status: DC
  Filled 2024-03-03: qty 3

## 2024-03-03 MED ORDER — GUAIFENESIN-DM 100-10 MG/5ML PO SYRP: 5.0000 mL | ORAL_SOLUTION | ORAL | Status: DC | PRN

## 2024-03-03 MED ORDER — ASPIRIN 81 MG PO TBEC
81.0000 mg | DELAYED_RELEASE_TABLET | Freq: Every day | ORAL | Status: DC
  Administered 2024-03-03: 81 mg via ORAL

## 2024-03-03 MED ORDER — HYDROXYCHLOROQUINE SULFATE 200 MG PO TABS
200.0000 mg | ORAL_TABLET | Freq: Two times a day (BID) | ORAL | Status: DC
  Administered 2024-03-03: 200 mg via ORAL
  Filled 2024-03-03: qty 1

## 2024-03-03 MED ORDER — IPRATROPIUM-ALBUTEROL 0.5-2.5 (3) MG/3ML IN SOLN
3.0000 mL | Freq: Four times a day (QID) | RESPIRATORY_TRACT | Status: DC
  Administered 2024-03-03: 3 mL via RESPIRATORY_TRACT
  Filled 2024-03-03: qty 3

## 2024-03-03 MED ORDER — PANTOPRAZOLE SODIUM 40 MG PO TBEC
40.0000 mg | DELAYED_RELEASE_TABLET | Freq: Every day | ORAL | Status: DC
  Administered 2024-03-03: 40 mg via ORAL
  Filled 2024-03-03: qty 1

## 2024-03-03 MED ORDER — IPRATROPIUM-ALBUTEROL 0.5-2.5 (3) MG/3ML IN SOLN: 3.0000 mL | Freq: Four times a day (QID) | RESPIRATORY_TRACT | Status: DC

## 2024-03-03 MED ORDER — AZATHIOPRINE 50 MG PO TABS
50.0000 mg | ORAL_TABLET | Freq: Two times a day (BID) | ORAL | Status: DC
  Administered 2024-03-03: 50 mg via ORAL
  Filled 2024-03-03: qty 1

## 2024-03-03 MED ORDER — PREGABALIN 50 MG PO CAPS: 50.0000 mg | ORAL_CAPSULE | Freq: Two times a day (BID) | ORAL | Status: DC | PRN

## 2024-03-03 MED ORDER — SULFAMETHOXAZOLE-TRIMETHOPRIM 800-160 MG PO TABS: 1.0000 | ORAL_TABLET | Freq: Two times a day (BID) | ORAL | Status: DC

## 2024-03-03 MED ORDER — ASPIRIN 81 MG PO CHEW: 81.0000 mg | CHEWABLE_TABLET | Freq: Two times a day (BID) | ORAL | Status: DC

## 2024-03-03 MED ADMIN — Perflutren Lipid Microsphere IV Susp 1.1 MG/ML: 2 mL | INTRAVENOUS | NDC 99999100031

## 2024-03-03 NOTE — Discharge Summary (Signed)
 Physician Discharge Summary  [name] FMW:[id number] DOB: [birth year] DOA: 03/02/2024  PCP: Joshua Debby CROME, MD  Admit date: 03/02/2024 Discharge date: 03/03/2024  Admitted From: Home Disposition: Home  Recommendations for Outpatient Follow-up:  Follow up with PCP in 1-2 weeks Follow-up with cardiology, Dr. Sheena as needed  Home Health: No Equipment/Devices: None  Discharge Condition: Stable CODE STATUS: Full code Diet recommendation: Heart healthy diet  History of present illness:  [name] is a 68 year old female with past medical history significant for HFmrEF, CAD s/p PCI/stenting, s/p CABG, prediabetes, hypothyroidism, HLD, GERD, lupus, OSA who presented to [facility] ED on 03/02/2024 with persistent cough, right-sided chest pain.  Prescribed day prior by PCP for pneumonia with Bactrim .  Also endorses recent exposure to a new pet bird which she relays the initiation of her symptoms.  In the ED, temperature 97.6 F, HR 73, RR 17, BP 122/77, SpO2 96% room air.  WBC 1.7, hemoglobin 13.4, platelet count 198.  Sodium 137, potassium 3.9, chloride 101, CO2 24, glucose 82, BUN 20, creat 1.45.  High sensitive troponin 26 followed by 24.  Chest x-ray with no active cardiopulmonary disease process.  CT angiogram chest with no pulmonary embolism, no acute findings.  EDP requested admission for further workup to rule out ACS.  TRH consulted for admission.  Hospital course:  Atypical chest pain ACS ruled out HFmrEF CAD s/p PCI/CABG Follows with cardiology outpatient, Dr. Sheena.  Patient presenting with right-sided chest pain, persistent cough.  Troponins 26 followed by 24, flat.  Chest x-ray with no active cardiopulmonary disease process.  CT angiogram chest negative for PE, no acute findings.  EKG EKG with normal sinus rhythm, rate 71, QTc 470, no dynamic changes.  Echocardiogram with LVEF 45-50%, LV with mildly decreased function, LV with no regional wall motion  normalities, LV internal cavity mildly dilated, indeterminate diastolic parameters, LA moderately dilated, mild mitral stenosis, aortic dilation noted, measuring 37 mm, IVC normal in size; similar to previous echocardiogram September 2025.  Suspect etiology of her symptoms likely due to viral URI versus exposure to bird.  Discussed utilization of Mucinex , Flonase , Zyrtec.  Continue home metoprolol  succinate, isosorbide  mononitrate, furosemide , Farxiga .  Outpatient follow-up with PCP/cardiology.  Suspect viral URI versus exposure Suspect symptoms of chest pain, increased upper respiratory viral symptoms related to new bird exposure versus viral illness.  Patient is afebrile without leukocytosis.  CT angiogram chest with no acute findings.  Supportive care, treatment with OTC Mucinex , Flonase , Zyrtec.  Lupus Continue Plaquenil , Imuran .  HLD Crestor  20 mg p.o. daily  Hypothyroidism Levothyroxine  75 mcg p.o. daily  GERD Continue Protonix  40 mg p.o. daily.  OSA Not on CPAP  Prediabetes Hemoglobin A1c 5.2% on 11/10/2023.  Outpatient follow-up with PCP.  Obesity, class I Body mass index is 33.56 kg/m.   Discharge Diagnoses:  Active Problems:   Chest pain    Discharge Instructions  Discharge Instructions     Call MD for:  difficulty breathing, headache or visual disturbances   Complete by: As directed    Call MD for:  extreme fatigue   Complete by: As directed    Call MD for:  persistant dizziness or light-headedness   Complete by: As directed    Call MD for:  persistant nausea and vomiting   Complete by: As directed    Call MD for:  severe uncontrolled pain   Complete by: As directed    Increase activity slowly   Complete by: As directed  Allergies as of 03/03/2024       Reactions   Amlodipine  Swelling   Leg edema   Cellcept  [mycophenolate ] Nausea Only        Medication List     STOP taking these medications    sulfamethoxazole -trimethoprim  800-160 MG  tablet Commonly known as: BACTRIM  DS       TAKE these medications    albuterol  108 (90 Base) MCG/ACT inhaler Commonly known as: VENTOLIN  HFA Inhale 2 puffs into the lungs every 6 (six) hours as needed for wheezing or shortness of breath.   aspirin  81 MG chewable tablet Chew 1 tablet (81 mg total) by mouth 2 (two) times daily. What changed: when to take this   azaTHIOprine  50 MG tablet Commonly known as: IMURAN  TAKE 1 TABLET BY MOUTH IN THE MORNING AND IN THE EVENING   Belsomra  15 MG Tabs Generic drug: Suvorexant  Take 1 tablet (15 mg total) by mouth at bedtime as needed.   cyclobenzaprine  5 MG tablet Commonly known as: FLEXERIL  Take 1 tablet (5 mg total) by mouth 3 (three) times daily as needed for muscle spasms.   Farxiga  10 MG Tabs tablet Generic drug: dapagliflozin  propanediol TAKE 1 TABLET BY MOUTH EVERY DAY   fluticasone  50 MCG/ACT nasal spray Commonly known as: FLONASE  SPRAY 2 SPRAYS INTO EACH NOSTRIL EVERY DAY   furosemide  20 MG tablet Commonly known as: LASIX  TAKE 1 TABLET BY MOUTH EVERY DAY   HYDROcodone  bit-homatropine 5-1.5 MG/5ML syrup Commonly known as: HYCODAN Take 5 mLs by mouth every 8 (eight) hours as needed for cough.   HYDROcodone -acetaminophen  5-325 MG tablet Commonly known as: NORCO/VICODIN Take 1-2 tablets by mouth 3 (three) times daily as needed for moderate pain (pain score 4-6).   hydroxychloroquine  200 MG tablet Commonly known as: PLAQUENIL  Take 200 mg by mouth 2 (two) times daily.   isosorbide  mononitrate 30 MG 24 hr tablet Commonly known as: IMDUR  Take 1 tablet (30 mg total) by mouth daily.   levothyroxine  75 MCG tablet Commonly known as: SYNTHROID  TAKE 1 TABLET BY MOUTH DAILY BEFORE BREAKFAST.   metoprolol  succinate 50 MG 24 hr tablet Commonly known as: TOPROL -XL Take 50 mg by mouth daily. Take with or immediately following a meal.   nitroGLYCERIN  0.4 MG SL tablet Commonly known as: NITROSTAT  Place 1 tablet (0.4 mg total)  under the tongue every 5 (five) minutes as needed for chest pain.   pantoprazole  40 MG tablet Commonly known as: PROTONIX  TAKE 1 TABLET BY MOUTH EVERY DAY   polyethylene glycol powder 17 GM/SCOOP powder Commonly known as: GLYCOLAX /MIRALAX  17 gram po BID prn constipation What changed:  how much to take how to take this when to take this reasons to take this additional instructions   potassium chloride  SA 20 MEQ tablet Commonly known as: KLOR-CON  M Take 1 tablet (20 mEq total) by mouth daily.   pregabalin  50 MG capsule Commonly known as: LYRICA  TAKE 1 CAPSULE (50 MG TOTAL) BY MOUTH 2 (TWO) TIMES DAILY AS NEEDED (PAIN).   rosuvastatin  20 MG tablet Commonly known as: CRESTOR  Take 1 tablet (20 mg total) by mouth daily.   tirzepatide  7.5 MG/0.5ML Pen Commonly known as: MOUNJARO  Inject 7.5 mg into the skin once a week.   Vitamin D  (Ergocalciferol ) 1.25 MG (50000 UNIT) Caps capsule Commonly known as: DRISDOL  Take 50,000 units by mouth every 10 days        Follow-up Information     Joshua Debby CROME, MD. Schedule an appointment as soon as possible for  a visit in 1 week(s).   Specialty: Internal Medicine Contact information: [address] [place] [address] [phone]         Sheena Pugh, DO. Schedule an appointment as soon as possible for a visit.   Specialty: Cardiology Contact information: [address] [place] [address] [phone]                Allergies[1]  Consultations: None   Procedures/Studies: ECHOCARDIOGRAM COMPLETE Result Date: 03/03/2024    ECHOCARDIOGRAM REPORT   Patient Name:   [name] Date of Exam: 03/03/2024 Medical Rec #:  [id number]          Height:       67.0 in Accession #:    [id number]         Weight:       214.3 lb Date of Birth:  1956-02-22         BSA:          2.082 m Patient Age:    68 years           BP:           99/70 mmHg Patient Gender: F                  HR:           70 bpm. Exam  Location:  Inpatient Procedure: 2D Echo, Color Doppler, Cardiac Doppler and Intracardiac            Opacification Agent (Both Spectral and Color Flow Doppler were            utilized during procedure). Indications:    Chest Pain R07.9  History:        Patient has prior history of Echocardiogram examinations, most                 recent 02/25/2021. CAD and Previous Myocardial Infarction; Risk                 Factors:Hypertension and Dyslipidemia.  Sonographer:    Tinnie Gosling RDCS Referring Phys: [id number] [name] N HALL IMPRESSIONS  1. Abnormal septal motion inferior wall hypokinesis . Left ventricular ejection fraction, by estimation, is 45 to 50%. The left ventricle has mildly decreased function. The left ventricle has no regional wall motion abnormalities. The left ventricular internal cavity size was mildly dilated. Left ventricular diastolic parameters are indeterminate.  2. Right ventricular systolic function is normal. The right ventricular size is normal.  3. Left atrial size was moderately dilated.  4. Post repair with annuloplasty ring Mild residual MR. Mean gradient in diastoli 6 peak 11 mmHg at HR of 72 bpm. The mitral valve has been repaired/replaced. No evidence of mitral valve regurgitation. Mild mitral stenosis.  5. The aortic valve is tricuspid. Aortic valve regurgitation is mild. Aortic valve sclerosis is present, with no evidence of aortic valve stenosis.  6. Aortic dilatation noted. There is mild dilatation of the ascending aorta, measuring 37 mm.  7. The inferior vena cava is normal in size with greater than 50% respiratory variability, suggesting right atrial pressure of 3 mmHg. FINDINGS  Left Ventricle: Abnormal septal motion inferior wall hypokinesis. Left ventricular ejection fraction, by estimation, is 45 to 50%. The left ventricle has mildly decreased function. The left ventricle has no regional wall motion abnormalities. Strain was  performed and the global longitudinal strain is  indeterminate. The left ventricular internal cavity size was mildly dilated. There is no left ventricular hypertrophy. Left  ventricular diastolic parameters are indeterminate. Right Ventricle: The right ventricular size is normal. No increase in right ventricular wall thickness. Right ventricular systolic function is normal. Left Atrium: Left atrial size was moderately dilated. Right Atrium: Right atrial size was normal in size. Pericardium: There is no evidence of pericardial effusion. Mitral Valve: Post repair with annuloplasty ring Mild residual MR. Mean gradient in diastoli 6 peak 11 mmHg at HR of 72 bpm. The mitral valve has been repaired/replaced. No evidence of mitral valve regurgitation. Mild mitral valve stenosis. MV peak gradient, 10.9 mmHg. The mean mitral valve gradient is 6.0 mmHg. Tricuspid Valve: The tricuspid valve is normal in structure. Tricuspid valve regurgitation is mild . No evidence of tricuspid stenosis. Aortic Valve: The aortic valve is tricuspid. Aortic valve regurgitation is mild. Aortic valve sclerosis is present, with no evidence of aortic valve stenosis. Pulmonic Valve: The pulmonic valve was normal in structure. Pulmonic valve regurgitation is trivial. No evidence of pulmonic stenosis. Aorta: Aortic dilatation noted. There is mild dilatation of the ascending aorta, measuring 37 mm. Venous: The inferior vena cava is normal in size with greater than 50% respiratory variability, suggesting right atrial pressure of 3 mmHg. IAS/Shunts: No atrial level shunt detected by color flow Doppler. Additional Comments: 3D was performed not requiring image post processing on an independent workstation and was indeterminate.  LEFT VENTRICLE PLAX 2D LVIDd:         5.20 cm      Diastology LVIDs:         3.70 cm      LV e' medial:    4.13 cm/s LV PW:         1.00 cm      LV E/e' medial:  40.2 LV IVS:        1.10 cm      LV e' lateral:   5.66 cm/s LVOT diam:     2.00 cm      LV E/e' lateral: 29.3 LV SV:          84 LV SV Index:   40 LVOT Area:     3.14 cm  LV Volumes (MOD) LV vol d, MOD A2C: 123.0 ml LV vol d, MOD A4C: 103.0 ml LV vol s, MOD A2C: 74.0 ml LV vol s, MOD A4C: 56.7 ml LV SV MOD A2C:     49.0 ml LV SV MOD A4C:     103.0 ml LV SV MOD BP:      48.7 ml RIGHT VENTRICLE RV S prime:     12.70 cm/s  PULMONARY VEINS TAPSE (M-mode): 1.9 cm      Diastolic Velocity: 37.30 cm/s                             S/D Velocity:       1.20                             Systolic Velocity:  43.70 cm/s LEFT ATRIUM              Index        RIGHT ATRIUM           Index LA diam:        4.20 cm  2.02 cm/m   RA Area:     11.70 cm LA Vol (A2C):   118.0 ml 56.66 ml/m  RA Volume:   24.90 ml  11.96 ml/m LA Vol (A4C):   63.6 ml  30.54 ml/m LA Biplane Vol: 88.6 ml  42.55 ml/m  AORTIC VALVE             PULMONIC VALVE LVOT Vmax:   130.00 cm/s PR End Diast Vel: 7.08 msec LVOT Vmean:  86.700 cm/s LVOT VTI:    0.268 m  AORTA Ao Root diam: 3.30 cm Ao Asc diam:  3.70 cm MITRAL VALVE                TRICUSPID VALVE MV Area (PHT): 2.14 cm     TR Peak grad:   21.5 mmHg MV Area VTI:   1.59 cm     TR Vmax:        232.00 cm/s MV Peak grad:  10.9 mmHg MV Mean grad:  6.0 mmHg     SHUNTS MV Vmax:       1.65 m/s     Systemic VTI:  0.27 m MV Vmean:      120.0 cm/s   Systemic Diam: 2.00 cm MV Decel Time: 355 msec MV E velocity: 166.00 cm/s MV A velocity: 134.00 cm/s MV E/A ratio:  1.24 Maude Emmer MD Electronically signed by Maude Emmer MD Signature Date/Time: 03/03/2024/2:33:47 PM    Final    CT Angio Chest PE W and/or Wo Contrast Result Date: 03/02/2024 EXAM: CTA of the Chest with contrast for PE 03/02/2024 08:10:36 PM TECHNIQUE: CTA of the chest was performed without and with the administration of 60 mL of iohexol  (OMNIPAQUE ) 350 MG/ML injection. Multiplanar reformatted images are provided for review. MIP images are provided for review. Automated exposure control, iterative reconstruction, and/or weight based adjustment of the mA/kV was utilized  to reduce the radiation dose to as low as reasonably achievable. COMPARISON: Chest x-ray 07/21/2023. CLINICAL HISTORY: Pulmonary embolism (PE) suspected, high prob. FINDINGS: PULMONARY ARTERIES: Pulmonary arteries are adequately opacified for evaluation. No pulmonary embolism. Main pulmonary artery is normal in caliber. MEDIASTINUM: Mitral annular valve replacement. At least 3-vessel coronary artery calcifications. Mild cardiomegaly. The heart and pericardium demonstrate no acute abnormality. Thoracic aorta has normal caliber. Severe atherosclerotic plaque. There is no acute abnormality of the thoracic aorta. LYMPH NODES: No mediastinal, hilar or axillary lymphadenopathy. LUNGS AND PLEURA: Mosaic attenuation of the lung. Expiratory phase of respiration. Limited evaluation of the subsegmental level due to motion artifact. No focal consolidation or pulmonary edema. No pleural effusion or pneumothorax. UPPER ABDOMEN: Status post cholecystectomy. Small hiatal hernia. Limited images of the upper abdomen are unremarkable. SOFT TISSUES AND BONES: No acute bone or soft tissue abnormality. IMPRESSION: 1. No pulmonary embolism. Limited evaluation of the subsegmental pulmonary arteries due to motion artifact. 2. No acute findings. Electronically signed by: Morgane Naveau MD 03/02/2024 08:24 PM EST RP Workstation: [id number]   DG Chest 2 View Result Date: 03/02/2024 CLINICAL DATA:  Chest pain EXAM: CHEST - 2 VIEW COMPARISON:  02/29/2024 FINDINGS: Sternotomy. No acute airspace disease or pleural effusion. Previously noted nodular density at the radial base is less apparent. Normal cardiac size. IMPRESSION: No active cardiopulmonary disease. Previously noted nodular density at the right base is less apparent. Electronically Signed   By: Luke Bun M.D.   On: 03/02/2024 16:54   DG Chest 2 View Result Date: 02/29/2024 CLINICAL DATA:  Cough and fever. EXAM: CHEST - 2 VIEW COMPARISON:  01/24/2022 FINDINGS: Lungs are  hyperexpanded. Subtle nodular density identified right lung base. Left lung clear. Cardiopericardial silhouette is at upper limits of normal for size. No  acute bony abnormality. IMPRESSION: Subtle nodular density at the right lung base. CT chest without contrast recommended to further evaluate. These results will be called to the ordering clinician or representative by the Radiologist Assistant, and communication documented in the PACS or [employer]. Electronically Signed   By: Camellia Candle M.D.   On: 02/29/2024 12:40     Subjective: Patient seen examined bedside, lying in bed.  No specific complaints.  Continues with intermittent cough, improved.  Discussed lab results, echo results that were unrevealing.  Suspect etiology is secondary to upper respiratory infection versus exposure to bird.  Discharging home.  Recommend outpatient follow-up PCP/cardiology as needed.  No further questions or concerns at this time.  Denies headache, no dizziness, no chest pain currently, no palpitations, no abdominal pain, no fever/chills/night sweats, no nausea/vomiting/diarrhea, no focal weakness, no fatigue, no paresthesia.  No acute events overnight per nursing.  Discharge Exam: Vitals:   03/03/24 1009 03/03/24 1333  BP: 99/70 118/72  Pulse: 72 72  Resp: 18 18  Temp: 98.4 F (36.9 C) 98.6 F (37 C)  SpO2: 96% 95%   Vitals:   03/03/24 0556 03/03/24 0842 03/03/24 1009 03/03/24 1333  BP: 116/68  99/70 118/72  Pulse: 68  72 72  Resp: 17  18 18   Temp: 98.3 F (36.8 C)  98.4 F (36.9 C) 98.6 F (37 C)  TempSrc: Oral  Oral   SpO2: 96% 92% 96% 95%  Weight: 97.2 kg     Height: 5' 7 (1.702 m)       Physical Exam: GEN: NAD, alert and oriented x 3, obese, HEENT: NCAT, PERRL, EOMI, sclera clear, MMM PULM: CTAB w/o wheezes/crackles, normal respiratory effort, on room air CV: RRR w/o M/G/R GI: abd soft, NTND, + BS MSK: no peripheral edema, moves all extremity independently with preserved muscle  strength NEURO: No focal neurological deficit PSYCH: normal mood/affect Integumentary: No concerning rashes/skin/wounds nonexposed skin surfaces    The results of significant diagnostics from this hospitalization (including imaging, microbiology, ancillary and laboratory) are listed below for reference.     Microbiology: No results found for this or any previous visit (from the past 240 hours).   Labs: BNP (last 3 results) No results for input(s): BNP in the last 8760 hours. Basic Metabolic Panel: Recent Labs  Lab 03/02/24 1633 03/02/24 2235 03/03/24 0728  NA 137  --  137  K 3.9  --  4.1  CL 101  --  105  CO2 24  --  19*  GLUCOSE 82  --  67*  BUN 20  --  21  CREATININE 1.45* 1.34* 1.35*  CALCIUM  8.7*  --  8.7*  MG  --   --  2.5*  PHOS  --   --  2.9   Liver Function Tests: No results for input(s): AST, ALT, ALKPHOS, BILITOT, PROT, ALBUMIN  in the last 168 hours. No results for input(s): LIPASE, AMYLASE in the last 168 hours. No results for input(s): AMMONIA in the last 168 hours. CBC: Recent Labs  Lab 03/02/24 1633 03/02/24 2235 03/03/24 0728  WBC 1.7* 1.5* 1.9*  HGB 13.4 13.3 13.1  HCT 39.9 39.4 38.7  MCV 99.5 98.5 97.5  PLT 198 200 194   Cardiac Enzymes: No results for input(s): CKTOTAL, CKMB, CKMBINDEX, TROPONINI in the last 168 hours. BNP: Invalid input(s): POCBNP CBG: No results for input(s): GLUCAP in the last 168 hours. D-Dimer No results for input(s): DDIMER in the last 72 hours. Hgb A1c No results for  input(s): HGBA1C in the last 72 hours. Lipid Profile No results for input(s): CHOL, HDL, LDLCALC, TRIG, CHOLHDL, LDLDIRECT in the last 72 hours. Thyroid  function studies No results for input(s): TSH, T4TOTAL, T3FREE, THYROIDAB in the last 72 hours.  Invalid input(s): FREET3 Anemia work up No results for input(s): VITAMINB12, FOLATE, FERRITIN, TIBC, IRON, RETICCTPCT in the last  72 hours. Urinalysis    Component Value Date/Time   COLORURINE YELLOW 03/02/2023 1552   APPEARANCEUR CLEAR 03/02/2023 1552   LABSPEC 1.010 03/02/2023 1552   PHURINE 6.5 03/02/2023 1552   GLUCOSEU >=1000 (A) 03/02/2023 1552   HGBUR NEGATIVE 03/02/2023 1552   BILIRUBINUR NEGATIVE 03/02/2023 1552   BILIRUBINUR negtaive 03/14/2016 1456   KETONESUR NEGATIVE 03/02/2023 1552   PROTEINUR negative 03/14/2016 1456   PROTEINUR 30 (A) 03/09/2016 0205   UROBILINOGEN 0.2 03/02/2023 1552   NITRITE NEGATIVE 03/02/2023 1552   LEUKOCYTESUR NEGATIVE 03/02/2023 1552   Sepsis Labs Recent Labs  Lab 03/02/24 1633 03/02/24 2235 03/03/24 0728  WBC 1.7* 1.5* 1.9*   Microbiology No results found for this or any previous visit (from the past 240 hours).   Time coordinating discharge: Over 30 minutes  SIGNED:   Camellia PARAS Trynity Skousen, DO  [facility] 03/03/2024, 3:23 PM     [1]  Allergies Allergen Reactions   Amlodipine  Swelling    Leg edema   Cellcept  [Mycophenolate ] Nausea Only

## 2024-03-03 NOTE — Progress Notes (Signed)
°   03/03/24 1359  TOC Brief Assessment  Insurance and Status Reviewed  Patient has primary care physician Yes  Home environment has been reviewed single family home  Prior level of function: independent  Prior/Current Home Services No current home services  Social Drivers of Health Review SDOH reviewed no interventions necessary  Readmission risk has been reviewed Yes  Transition of care needs no transition of care needs at this time    Signed: Heather Saltness, MSW, LCSW Clinical Social Worker Inpatient Care Management 03/03/2024 1:59 PM

## 2024-03-03 NOTE — Plan of Care (Signed)

## 2024-03-03 NOTE — Inpatient Diabetes Management (Signed)
 Inpatient Diabetes Program Recommendations  AACE/ADA: New Consensus Statement on Inpatient Glycemic Control (2015)  Target Ranges:  Prepandial:   less than 140 mg/dL      Peak postprandial:   less than 180 mg/dL (1-2 hours)      Critically ill patients:  140 - 180 mg/dL   Lab Results  Component Value Date   GLUCAP 100 (H) 12/01/2023   HGBA1C 5.2 11/10/2023    Review of Glycemic Control  Diabetes history: Insulin  resistance Outpatient Diabetes medications: Farxiga  10 daily, Mounjaro  7.5 mg weekly Current orders for Inpatient glycemic control: None  HgbA1C - 5.2%  Inpatient Diabetes Program Recommendations:    Monitor CBGs.   May need Novolog  0-6 TID.  Will follow.  Thank you. Shona Brandy, RD, LDN, CDCES Inpatient Diabetes Coordinator [phone]

## 2024-03-03 NOTE — Progress Notes (Signed)
°  Echocardiogram 2D Echocardiogram has been performed.  Tinnie FORBES Gosling RDCS 03/03/2024, 2:21 PM

## 2024-03-04 NOTE — Telephone Encounter (Signed)
 Please advise

## 2024-03-07 ENCOUNTER — Ambulatory Visit: Admitting: Orthopaedic Surgery

## 2024-03-07 ENCOUNTER — Encounter: Payer: Self-pay | Admitting: Orthopaedic Surgery

## 2024-03-07 ENCOUNTER — Telehealth: Payer: Self-pay

## 2024-03-07 ENCOUNTER — Other Ambulatory Visit: Payer: Self-pay

## 2024-03-07 DIAGNOSIS — Z96642 Presence of left artificial hip joint: Secondary | ICD-10-CM

## 2024-03-07 MED ORDER — PREDNISONE 50 MG PO TABS: ORAL_TABLET | ORAL | 0 refills | Status: AC

## 2024-03-07 NOTE — Progress Notes (Signed)
 The patient is a 68 year old female who is now 3 months status post a left total hip arthroplasty and 7 months status post left replacing her right hip.  She said the hips are doing well.  She has been dealing with a chronic cough and congestion in her chest.  Her CT scan and chest x-ray per her have been negative but she would like to have a steroid to see if this would help with her cough.  I do think this is reasonable with listening to her and her lungs and the fact she is not a diabetic.  She says her hips are doing great.  She is walking without assistive device.  Both her hips move smoothly and fluidly.  She is able to participate in her actives day living much easier as well.  Standing AP pelvis and lateral of the more recent left hip shows well-seated implants bilaterally.  From my standpoint she will continue to increase her activities as comfort allows.  Will see her back in 6 months for just standing AP pelvis.  If there are orthopedic issues before then or issues with her hip she knows to let us  know.  I did send in several days of prednisone  for her to see if this will help.

## 2024-03-07 NOTE — Transitions of Care (Post Inpatient/ED Visit) (Unsigned)
° °  03/07/2024  Name: [name] MRN: [id number] DOB: 27-Sep-1955  Today's TOC FU Call Status: Today's TOC FU Call Status:: Unsuccessful Call (1st Attempt) Unsuccessful Call (1st Attempt) Date: 03/07/24  Attempted to reach the patient regarding the most recent Inpatient/ED visit.  Follow Up Plan: Additional outreach attempts will be made to reach the patient to complete the Transitions of Care (Post Inpatient/ED visit) call.   Signature Julian Lemmings, LPN [facility] Nurse Health Advisor Direct Dial [phone]

## 2024-03-09 NOTE — Transitions of Care (Post Inpatient/ED Visit) (Signed)
 03/09/2024  Name: [name] MRN: [id number] DOB: 1955/07/10  Today's TOC FU Call Status: Today's TOC FU Call Status:: Successful TOC FU Call Completed Unsuccessful Call (1st Attempt) Date: 03/07/24 [facility] FU Call Complete Date: 03/09/24  Patient's Name and Date of Birth confirmed. Name, DOB  Transition Care Management Follow-up Telephone Call Date of Discharge: 03/03/24 Discharge Facility: [facility]) Type of Discharge: Inpatient Admission Primary Inpatient Discharge Diagnosis:: Chest pain How have you been since you were released from the hospital?: Better Any questions or concerns?: No  Items Reviewed: Did you receive and understand the discharge instructions provided?: Yes Medications obtained,verified, and reconciled?: Yes (Medications Reviewed) Any new allergies since your discharge?: No Dietary orders reviewed?: Yes Do you have support at home?: Yes People in Home [RPT]: friend(s)  Medications Reviewed Today: Medications Reviewed Today     Reviewed by Emmitt Pan, LPN (Licensed Practical Nurse) on 03/09/24 at 1152  Med List Status: <None>   Medication Order Taking? Sig Documenting Provider Last Dose Status Informant  albuterol  (VENTOLIN  HFA) 108 (90 Base) MCG/ACT inhaler [id number] Yes Inhale 2 puffs into the lungs every 6 (six) hours as needed for wheezing or shortness of breath. Wendee Lynwood HERO, NP  Active Self, Pharmacy Records  aspirin  81 MG chewable tablet [id number] Yes Chew 1 tablet (81 mg total) by mouth 2 (two) times daily.  Patient taking differently: Chew 81 mg by mouth daily.   Vernetta Lonni GRADE, MD  Active Self, Pharmacy Records  azaTHIOprine  (IMURAN ) 50 MG tablet [id number] Yes TAKE 1 TABLET BY MOUTH IN THE MORNING AND IN THE KARNA Joshua Debby LITTIE, MD  Active Self, Pharmacy Records  cyclobenzaprine  (FLEXERIL ) 5 MG tablet [id number] Yes Take 1 tablet (5 mg total) by mouth 3 (three) times daily as needed for muscle spasms. Vernetta Lonni GRADE, MD  Active Self, Pharmacy Records  FARXIGA  10 MG TABS tablet [id number] Yes TAKE 1 TABLET BY MOUTH EVERY DAY Tobb, Kardie, DO  Active Self, Pharmacy Records  fluticasone  (FLONASE ) 50 MCG/ACT nasal spray [id number] Yes SPRAY 2 SPRAYS INTO EACH NOSTRIL EVERY DAY Norleen Lynwood ORN, MD  Active Self, Pharmacy Records  furosemide  (LASIX ) 20 MG tablet [id number] Yes TAKE 1 TABLET BY MOUTH EVERY DAY Tobb, Kardie, DO  Active Self, Pharmacy Records  HYDROcodone  bit-homatropine [facility]) 5-1.5 MG/5ML syrup [id number] Yes Take 5 mLs by mouth every 8 (eight) hours as needed for cough. Joshua Debby LITTIE, MD  Active Self, Pharmacy Records  HYDROcodone -acetaminophen  (NORCO/VICODIN) 5-325 MG tablet [id number] Yes Take 1-2 tablets by mouth 3 (three) times daily as needed for moderate pain (pain score 4-6). Gretta Bertrum ORN, PA-C  Active Self, Pharmacy Records  hydroxychloroquine  (PLAQUENIL ) 200 MG tablet [id number] Yes Take 200 mg by mouth 2 (two) times daily. [provider]  Active Self, Pharmacy Records  isosorbide  mononitrate (IMDUR ) 30 MG 24 hr tablet [id number] Yes Take 1 tablet (30 mg total) by mouth daily. Tobb, Kardie, DO  Active Self, Pharmacy Records  levothyroxine  (SYNTHROID ) 75 MCG tablet [id number] Yes TAKE 1 TABLET BY MOUTH DAILY BEFORE BREAKFAST. Joshua Debby LITTIE, MD  Active Self, Pharmacy Records  metoprolol  succinate (TOPROL -XL) 50 MG 24 hr tablet [id number] Yes Take 50 mg by mouth daily. Take with or immediately following a meal. [provider]  Active Self, Pharmacy Records  nitroGLYCERIN  (NITROSTAT ) 0.4 MG SL tablet [id number] Yes Place 1 tablet (0.4 mg total) under the tongue every 5 (five) minutes as needed for chest pain. Tobb, Kardie, DO  Active Self, Pharmacy  Records           Med Note RENNIS, DUROJAHYE' R   Wed Mar 02, 2024 11:32 PM) Has not had ton use it  pantoprazole  (PROTONIX ) 40 MG tablet [id number] Yes TAKE 1 TABLET BY MOUTH EVERY DAY Tobb, Kardie, DO  Active Self,  Pharmacy Records  polyethylene glycol powder (GLYCOLAX /MIRALAX ) 17 GM/SCOOP powder [id number] Yes 17 gram po BID prn constipation  Patient taking differently: Take 17 g by mouth daily as needed for mild constipation or moderate constipation.   Midge Sober, DO  Active Self, Pharmacy Records  potassium chloride  SA (KLOR-CON  M) 20 MEQ tablet [id number] Yes Take 1 tablet (20 mEq total) by mouth daily. Jerri Keys, MD  Active Self, Pharmacy Records           Med Note (CRUTHIS, CHLOE C   Wed Nov 25, 2023 10:28 AM) No fill hx. Pt is adamant she is still taking this medication.   predniSONE  (DELTASONE ) 50 MG tablet [id number] Yes Take once daily for 5 days Vernetta Lonni GRADE, MD  Active   pregabalin  (LYRICA ) 50 MG capsule [id number] Yes TAKE 1 CAPSULE (50 MG TOTAL) BY MOUTH 2 (TWO) TIMES DAILY AS NEEDED (PAIN). Gretta Bertrum ORN, PA-C  Active Self, Pharmacy Records  rosuvastatin  (CRESTOR ) 20 MG tablet [id number] Yes Take 1 tablet (20 mg total) by mouth daily. Tobb, Kardie, DO  Active Self, Pharmacy Records  Suvorexant  (BELSOMRA ) 15 MG TABS [id number] Yes Take 1 tablet (15 mg total) by mouth at bedtime as needed. Joshua Debby CROME, MD  Active Self, Pharmacy Records  tirzepatide  (MOUNJARO ) 7.5 MG/0.5ML Pen [id number] Yes Inject 7.5 mg into the skin once a week. Midge Sober, DO  Active Self, Pharmacy Records  Vitamin D , Ergocalciferol , (DRISDOL ) 1.25 MG (50000 UNIT) CAPS capsule [id number] Yes Take 50,000 units by mouth every 10 days Midge Sober, DO  Active Self, Pharmacy Records            Home Care and Equipment/Supplies: Were Home Health Services Ordered?: NA Any new equipment or medical supplies ordered?: NA  Functional Questionnaire: Do you need assistance with bathing/showering or dressing?: No Do you need assistance with meal preparation?: No Do you need assistance with eating?: No Do you have difficulty maintaining continence: No Do you need assistance with getting out of bed/getting  out of a chair/moving?: No Do you have difficulty managing or taking your medications?: No  Follow up appointments reviewed: PCP Follow-up appointment confirmed?: Yes Date of PCP follow-up appointment?: 03/11/24 Follow-up Provider: [facility] Follow-up appointment confirmed?: NA Do you need transportation to your follow-up appointment?: No Do you understand care options if your condition(s) worsen?: Yes-patient verbalized understanding    SIGNATURE Julian Lemmings, LPN [facility] Nurse Health Advisor Direct Dial [phone]

## 2024-03-11 ENCOUNTER — Inpatient Hospital Stay: Admitting: Family Medicine

## 2024-03-11 ENCOUNTER — Other Ambulatory Visit: Payer: Self-pay | Admitting: Cardiology

## 2024-03-11 NOTE — Progress Notes (Deleted)
" ° °  Acute Office Visit  Subjective:     Patient ID: [name], female    DOB: May 26, 1955, 68 y.o.   MRN: [id number]  No chief complaint on file.   HPI  Discussed the use of AI scribe software for clinical note transcription with the patient, who gave verbal consent to proceed.  HFU from [facility] admission 03/02/24-03/03/24 for atypical chest pain, ACS was ruled out TOC 03/07/24 History of Present Illness      ROS Per HPI      Objective:    There were no vitals taken for this visit.   Physical Exam Vitals and nursing note reviewed.  Constitutional:      General: She is not in acute distress.    Appearance: Normal appearance. She is normal weight.  HENT:     Head: Normocephalic and atraumatic.     Right Ear: External ear normal.     Left Ear: External ear normal.     Nose: Nose normal.     Mouth/Throat:     Mouth: Mucous membranes are moist.     Pharynx: Oropharynx is clear.  Eyes:     Extraocular Movements: Extraocular movements intact.     Pupils: Pupils are equal, round, and reactive to light.  Cardiovascular:     Rate and Rhythm: Normal rate and regular rhythm.     Pulses: Normal pulses.     Heart sounds: Normal heart sounds.  Pulmonary:     Effort: Pulmonary effort is normal. No respiratory distress.     Breath sounds: Normal breath sounds. No wheezing, rhonchi or rales.  Musculoskeletal:        General: Normal range of motion.     Cervical back: Normal range of motion.     Right lower leg: No edema.     Left lower leg: No edema.  Lymphadenopathy:     Cervical: No cervical adenopathy.  Neurological:     General: No focal deficit present.     Mental Status: She is alert and oriented to person, place, and time.  Psychiatric:        Mood and Affect: Mood normal.        Thought Content: Thought content normal.     No results found for any visits on 03/11/24.      Assessment & Plan:   Assessment and Plan Assessment &  Plan      No orders of the defined types were placed in this encounter.    No orders of the defined types were placed in this encounter.   No follow-ups on file.  Corean LITTIE Ku, FNP  "

## 2024-03-14 ENCOUNTER — Inpatient Hospital Stay: Admitting: Family Medicine

## 2024-03-23 NOTE — Telephone Encounter (Signed)
 PA for Mounjaro 7.5  has been submitted, awaiting PA questions.

## 2024-03-23 NOTE — Telephone Encounter (Signed)
 PA questions for Mounjaro  7.5MG   have been answered and all documentation has been included. Waiting on a determination.

## 2024-03-28 NOTE — Telephone Encounter (Signed)
 PA for Mounjaro  7.5 MG has been denied. PA is now complete.      Message from Plan Your PA request has been denied. Additional information will be provided in the denial communication. (Message 1140)

## 2024-03-29 ENCOUNTER — Other Ambulatory Visit: Payer: Self-pay | Admitting: Cardiology

## 2024-03-30 ENCOUNTER — Encounter: Payer: Self-pay | Admitting: Physician Assistant

## 2024-03-30 ENCOUNTER — Other Ambulatory Visit (INDEPENDENT_AMBULATORY_CARE_PROVIDER_SITE_OTHER): Payer: Self-pay

## 2024-03-30 ENCOUNTER — Ambulatory Visit: Admitting: Physician Assistant

## 2024-03-30 DIAGNOSIS — M25512 Pain in left shoulder: Secondary | ICD-10-CM

## 2024-03-30 MED ORDER — METHYLPREDNISOLONE 4 MG PO TBPK: ORAL_TABLET | ORAL | 0 refills | Status: AC

## 2024-03-30 MED ORDER — CYCLOBENZAPRINE HCL 10 MG PO TABS: 10.0000 mg | ORAL_TABLET | Freq: Three times a day (TID) | ORAL | 0 refills | Status: AC | PRN

## 2024-03-30 NOTE — Progress Notes (Signed)
 "  Office Visit Note   Patient: [name]           Date of Birth: [birth year]           MRN: [id number] Visit Date: 03/30/2024              Requested by: Joshua Debby CROME, MD [address] [place],  [place] [address] PCP: Joshua Debby CROME, MD   Assessment & Plan: Visit Diagnoses:  1. Acute pain of left shoulder     Plan: Patient is a pleasant 69 year old woman who is a patient of Dr. Damian she comes in today with left scapula pain running down to her mid arm but no associated paresthesias.  Her shoulder examination is benign.  She does not have any reproduction of symptoms with examination of her neck.  She is very sensitive to even light touch in the trapezius musculature.  Again this was after doing some flies at the gym.  There is no evidence of infection.  I talked her about trying a Medrol  Dosepak she has taken steroids in the past and done okay.  Will also call her in some Flexeril  which she has taken in the past and tolerates.  I told her not to take the Flexeril  and drive or do other activities that require attention.  Can call me if she does not improve  Follow-Up Instructions: Return if symptoms worsen or fail to improve.   Orders:  Orders Placed This Encounter  Procedures   XR Shoulder Left   Meds ordered this encounter  Medications   cyclobenzaprine  (FLEXERIL ) 10 MG tablet    Sig: Take 1 tablet (10 mg total) by mouth 3 (three) times daily as needed for muscle spasms.    Dispense:  30 tablet    Refill:  0   methylPREDNISolone  (MEDROL  DOSEPAK) 4 MG TBPK tablet    Sig: Take as directed with food    Dispense:  21 tablet    Refill:  0      Procedures: No procedures performed   Clinical Data: No additional findings.   Subjective: Chief Complaint  Patient presents with   Left Shoulder - Pain    HPI patient is a 69 year old woman who comes in today with a 4-day history of left shoulder pain.  She did chest flies at the gym the day before but  the shoulder felt fine.  She had no known injury no popping no pulling pain is in the posterior shoulder scapula down the triceps forearm denies any paresthesias.  She said heat makes it worse ice helps.  She took an oxycodone  this morning with no relief she has limited active range of motion due to pain  Review of Systems  All other systems reviewed and are negative.    Objective: Vital Signs: There were no vitals taken for this visit.  Physical Exam Constitutional:      Appearance: Normal appearance.  Pulmonary:     Effort: Pulmonary effort is normal.  Skin:    General: Skin is warm and dry.  Neurological:     General: No focal deficit present.     Mental Status: She is alert and oriented to person, place, and time.  Psychiatric:        Mood and Affect: Mood normal.        Behavior: Behavior normal.     Ortho Exam Examination she is tender even to light touch over her the left trapezius.  Left deltoid.  She has  no redness no fluctuance.  She does have good active though slow range of motion of her shoulder with elevation internal rotation behind her back her abductor strength is good her grip strength is good.  No paresthesias no pain with range of motion of her neck or reproduction of significant symptoms Specialty Comments:  EXAM: MRI LUMBAR SPINE WITHOUT CONTRAST   TECHNIQUE: Multiplanar, multisequence MR imaging of the lumbar spine was performed. No intravenous contrast was administered.   COMPARISON:  Lumbar MRI 08/31/2016   FINDINGS: Segmentation:  Normal   Alignment:  Mild retrolisthesis L1-2, L2-3, L3-4.   Vertebrae: Negative for fracture or mass. Discogenic changes on the left at L5-S1 have progressed in the interval.   Conus medullaris and cauda equina: Conus extends to the L1-2 level. Conus and cauda equina appear normal.   Paraspinal and other soft tissues: Negative for paraspinous mass, adenopathy, or fluid collection.   Disc levels:   L1-2: Mild  degenerative change.  Negative for stenosis   L2-3: Mild facet degeneration.  Negative for stenosis   L3-4: Mild to moderate facet degeneration. Mild disc bulging. No significant stenosis.   L4-5: Severe facet degeneration bilaterally with facet joint overgrowth and bilateral facet joint effusions. This is similar to the prior study. There is diffuse bulging of the disc. Mild spinal stenosis and moderate subarticular stenosis similar to the prior study.   L5-S1: Progressive disc degeneration and disc space narrowing. Sclerotic endplate changes and spurring on the left have progressed since the prior study. There is bilateral facet hypertrophy. There is moderate subarticular stenosis on the left which has progressed in the interval. Left paracentral disc protrusion shows significant interval improvement. There is improved mass effect on left S1 nerve root compared with the prior study.   IMPRESSION: Mild spinal stenosis and moderate subarticular stenosis bilaterally L4-5 unchanged. Severe facet degeneration L4-5   Improvement in left-sided disc protrusion L5-S1 with less mass effect on the S1 nerve root. There is progressive disc degeneration and spurring on the left at L5-S1 with progression of subarticular stenosis on the left due to spurring.     Electronically Signed   By: Carlin Gaskins M.D.   On: 10/13/2019 15:05  Imaging: XR Shoulder Left Result Date: 03/30/2024 Radiographs of the left shoulder no evidence of dislocation or fracture she does have some degenerative changes of the acromioclavicular joint    PMFS History: Patient Active Problem List   Diagnosis Date Noted   Chest pain 03/02/2024   Acute cough 02/29/2024   Fever 02/29/2024   Right lower lobe pulmonary nodule 02/29/2024   Pneumonia of right lower lobe due to infectious organism 02/29/2024   Acquired hammer toe of left foot 01/27/2024   Acquired hammer toe of right foot 01/27/2024   Fatty  liver 01/27/2024   Frequency of micturition 01/27/2024   Hallux valgus of left foot 01/27/2024   Long term current use of therapeutic drug 01/27/2024   Primary localized osteoarthrosis of multiple sites 01/27/2024   Serum creatinine raised 01/27/2024   Urinary hesitancy 01/27/2024   Deficiency anemia 01/27/2024   Psychophysiological insomnia 01/27/2024   Status post total replacement of left hip 12/01/2023   Status post total replacement of right hip 08/11/2023   Avascular necrosis of bone of right hip (HCC) 06/29/2023   Avascular necrosis of bone of left hip (HCC) 06/29/2023   Need for prophylactic vaccination with combined diphtheria-tetanus-pertussis (DTP) vaccine 03/02/2023   Need for RSV vaccination 03/02/2023   Vitamin B deficiency 06/26/2022  BMI 35.0-35.9,adult 06/26/2022   Drug-induced constipation 06/26/2022   Allergic rhinitis 06/08/2022   Morbid obesity (HCC)-starting bmi 41.64 04/23/2022   BMI 36.0-36.9,adult-current bmi 04/23/2022   Insulin  resistance 03/25/2022   Vitamin D  deficiency 06/17/2021   Encounter for general adult medical examination with abnormal findings 04/08/2021   Chronic heart failure with preserved ejection fraction (HCC) 04/03/2021   Hyperlipidemia LDL goal <70 04/03/2021   Screen for colon cancer 04/03/2021   Systemic lupus erythematosus (HCC) 02/25/2021   Stage 3a chronic kidney disease (HCC)    Hypertension 08/08/2020   Chronic idiopathic constipation 05/31/2020   Class 3 severe obesity with serious comorbidity and body mass index (BMI) of 40.0 to 44.9 in adult [facility]) 05/31/2020   Postoperative atrial fibrillation (HCC) 02/18/2017   Pain in right hip 07/21/2016   Osteoarthritis of spine with radiculopathy, lumbar region 06/24/2016   Viral URI with cough 04/17/2016   Hypothyroidism 04/08/2011   Coronary artery disease involving coronary bypass graft of native heart with angina pectoris 02/26/2009    Gastroesophageal reflux disease 08/05/2007   Systemic lupus erythematosus with focal and segmental proliferative glomerulonephritis (HCC) 08/05/2007   Past Medical History:  Diagnosis Date   Arthritis    Back pain    Bilateral swelling of feet and ankles    no current problems per pt 07/30/23   CAD (coronary artery disease)    a. remote stents/angioplasty. b. s/p CABG 01/2017 with mitral valve annuloplasty.   Chest pain    Constipation    Diastolic heart failure (HCC)    Esophageal reflux    Gallbladder problem    GERD (gastroesophageal reflux disease)    History of MI (myocardial infarction)    Hyperlipidemia    Hypertension    Hypothyroidism    Immune thrombocytopenic purpura (HCC)    Joint pain    Lupus erythematosus    in remission since 2013   Mitral valve insufficiency and aortic valve insufficiency    Myocardial infarction (HCC)    x 2   Pneumonia    x several   PONV (postoperative nausea and vomiting)    Postoperative atrial fibrillation (HCC)    Pre-diabetes    Renal disease    stage 3   Severe mitral regurgitation    a. s/p MV annuloplasty 01/2017 at time of CABG.   Shortness of breath on exertion    no current problems - lost weight, stop smoking and hgb wnl in 02/2023   Sleep apnea    does not use CPAP - patient states mild, lost weight   Status post mitral valve annuloplasty    Unspecified disease of pericardium     Family History  Problem Relation Age of Onset   Heart disease Mother    Hyperlipidemia Mother    Cancer Mother        breast and cervical   Paget's disease of bone Mother    Fibromyalgia Mother    Breast cancer Mother    Hypertension Father    Heart disease Father        PVD   GER disease Father    Breast cancer Sister    Heart attack Maternal Grandmother    Cancer Paternal Grandfather        colon    Past Surgical History:  Procedure Laterality Date   BREAST BIOPSY Right 06/03/2023    MM RT BREAST BX W LOC DEV 1ST LESION IMAGE BX SPEC STEREO GUIDE 06/03/2023 [facility] MAMMOGRAPHY   CHOLECYSTECTOMY, LAPAROSCOPIC  2010  CORONARY ARTERY BYPASS GRAFT N/A 01/30/2017   Procedure: CORONARY ARTERY BYPASS GRAFTING (CABG), Times four  using the right saphaneous vein, harvested endoscopicly.  and left internal mammary artery .;  Surgeon: Lucas Dorise POUR, MD;  Location: [facility] OR;  Service: Open Heart Surgery;  Laterality: N/A;   LEFT HEART CATH AND CORONARY ANGIOGRAPHY N/A 01/23/2017   Procedure: LEFT HEART CATH AND CORONARY ANGIOGRAPHY;  Surgeon: Claudene Victory ORN, MD;  Location: [facility] INVASIVE CV LAB;  Service: Cardiovascular;  Laterality: N/A;   MITRAL VALVE REPAIR N/A 01/30/2017   Procedure: MITRAL VALVE REPAIR (MVR);  Surgeon: Lucas Dorise POUR, MD;  Location: [facility] OR;  Service: Open Heart Surgery;  Laterality: N/A;   plastic surgical repair      dog bite to leg   RIGHT/LEFT HEART CATH AND CORONARY/GRAFT ANGIOGRAPHY N/A 02/26/2021   Procedure: RIGHT/LEFT HEART CATH AND CORONARY/GRAFT ANGIOGRAPHY;  Surgeon: Claudene Victory ORN, MD;  Location: [facility] INVASIVE CV LAB;  Service: Cardiovascular;  Laterality: N/A;   SPLENECTOMY  5-04   TEE WITHOUT CARDIOVERSION N/A 01/27/2017   Procedure: TRANSESOPHAGEAL ECHOCARDIOGRAM (TEE);  Surgeon: Francyne Headland, MD;  Location: [facility] ENDOSCOPY;  Service: Cardiovascular;  Laterality: N/A;   TEE WITHOUT CARDIOVERSION N/A 01/30/2017   Procedure: TRANSESOPHAGEAL ECHOCARDIOGRAM (TEE);  Surgeon: Lucas Dorise POUR, MD;  Location: [facility] OR;  Service: Open Heart Surgery;  Laterality: N/A;   TOTAL HIP ARTHROPLASTY Right 08/11/2023   Procedure: ARTHROPLASTY, HIP, TOTAL, ANTERIOR APPROACH;  Surgeon: Vernetta Lonni GRADE, MD;  Location: [facility] OR;  Service: Orthopedics;  Laterality: Right;   TOTAL HIP ARTHROPLASTY Left 12/01/2023   Procedure: ARTHROPLASTY, LEFT HIP, TOTAL, ANTERIOR APPROACH;  Surgeon: Vernetta Lonni GRADE, MD;  Location: [facility] OR;  Service: Orthopedics;  Laterality: Left;    ULTRASOUND GUIDANCE FOR VASCULAR ACCESS  01/23/2017   Procedure: Ultrasound Guidance For Vascular Access;  Surgeon: Claudene Victory ORN, MD;  Location: [facility] INVASIVE CV LAB;  Service: Cardiovascular;;   Social History   Occupational History   OccupationPassenger Transport Manager    Employer: [employer]   Tobacco Use   Smoking status: Former    Current packs/day: 0.00    Average packs/day: 1 pack/day for 30.0 years (30.0 ttl pk-yrs)    Types: Cigarettes    Start date: 03/23/1986    Quit date: 12/23/2015    Years since quitting: 8.2    Passive exposure: Past   Smokeless tobacco: Never  Vaping Use   Vaping status: Never Used  Substance and Sexual Activity   Alcohol use: No   Drug use: No   Sexual activity: Not Currently    Birth control/protection: Post-menopausal    Comment: 1st intercourse 69 yo-More than 5 partners        "

## 2024-04-04 ENCOUNTER — Other Ambulatory Visit (INDEPENDENT_AMBULATORY_CARE_PROVIDER_SITE_OTHER): Payer: Self-pay | Admitting: Family Medicine

## 2024-04-04 ENCOUNTER — Ambulatory Visit (INDEPENDENT_AMBULATORY_CARE_PROVIDER_SITE_OTHER): Admitting: Family Medicine

## 2024-04-04 ENCOUNTER — Encounter (INDEPENDENT_AMBULATORY_CARE_PROVIDER_SITE_OTHER): Payer: Self-pay | Admitting: Family Medicine

## 2024-04-04 DIAGNOSIS — I10 Essential (primary) hypertension: Secondary | ICD-10-CM

## 2024-04-04 DIAGNOSIS — Z6834 Body mass index (BMI) 34.0-34.9, adult: Secondary | ICD-10-CM | POA: Diagnosis not present

## 2024-04-04 DIAGNOSIS — Z6835 Body mass index (BMI) 35.0-35.9, adult: Secondary | ICD-10-CM

## 2024-04-04 DIAGNOSIS — R632 Polyphagia: Secondary | ICD-10-CM | POA: Diagnosis not present

## 2024-04-04 DIAGNOSIS — E88819 Insulin resistance, unspecified: Secondary | ICD-10-CM | POA: Diagnosis not present

## 2024-04-04 DIAGNOSIS — E669 Obesity, unspecified: Secondary | ICD-10-CM | POA: Diagnosis not present

## 2024-04-04 DIAGNOSIS — E559 Vitamin D deficiency, unspecified: Secondary | ICD-10-CM

## 2024-04-04 MED ORDER — ZEPBOUND 7.5 MG/0.5ML ~~LOC~~ SOAJ: 7.5000 mg | SUBCUTANEOUS | 1 refills | Status: AC

## 2024-04-04 NOTE — Progress Notes (Signed)
 "  Barnie DOROTHA Adams, D.O.  ABFM, ABOM Specializing in Clinical Bariatric Medicine  Office located at: [address] [place], [place]  [address]     Medications Discontinued During This Encounter  Medication Reason   tirzepatide  (MOUNJARO ) 7.5 MG/0.5ML Pen      Meds ordered this encounter  Medications   tirzepatide  (ZEPBOUND ) 7.5 MG/0.5ML Pen    Sig: Inject 7.5 mg into the skin once a week.    Dispense:  2 mL    Refill:  1      A) FOR THE CHRONIC DISEASE OF OBESITY:  Obesity (HCC)-starting bmi 41.64 BMI 35.0-35.9,adult, current BMI 34.83   Chief complaint: Obesity [name] is here to discuss her progress with her obesity treatment plan.   History of present illness / Interval history:  [name] is here today for her follow-up office visit.  Since last OV on 02/24/2024, pt is down 1lb .    02/24/24 15:00 04/04/24 13:00   Body Fat % 48.8 % 49.8 %  Muscle Mass (lbs) 100.8 lbs 98.6 lbs  Fat Mass (lbs) 101.4 lbs 103 lbs  Total Body Water (lbs) 72.8 lbs 73 lbs  Visceral Fat Rating  15 15  Counseling done on how various foods will affect these numbers and how to maximize success   Total lbs lost to date: -52 lbs Total Fat Mass in lbs lost to date: -32.2 Total weight loss percentage to date: -20.16 %   Nutrition Therapy She is on the Category 1 Plan and states she is following her eating plan approximately 90 % of the time.   - Tracking Calories/Macros: no  - Eating More Whole Foods: yes  - Adequate Protein Intake: yes  - Adequate Water Intake: yes  - Skipping Meals: no  - Sleeping 7-9 Hours/ Night: yes   [name] is currently in the action stage of change. As such, her goal is to continue weight management plan.  She has agreed to: continue current plan   Physical Activity Pt is going to the gym 60  minutes 2-3 days per week   [name] has been advised to work up to 300-450 minutes of moderate intensity aerobic activity a week and strengthening  exercises 2-3 times per week for cardiovascular health, weight loss maintenance and preservation of muscle mass.  She has agreed to : Increase volume of physical activity to a goal of 240 minutes a week and Combine aerobic and strengthening exercises for efficiency and improved cardiometabolic health.   Behavioral Modifications Evidence-based interventions for health behavior change were utilized today including the discussion of  1) self monitoring techniques:  meditating 2) self care:  exercising, find a counselor, praying, do something outside 3) SMART goals for next OV:  Lysle walks 2 days a week alongside current exercise regimen and find a veterinary surgeon.  Regarding patient's less desirable eating habits and patterns, we employed the technique of small changes.   We discussed the following today: increasing lean protein intake to established goals, continue to work on implementation of reduced calorie nutritional plan, and continue to practice mindfulness when eating Additional resources provided today: None   Medical Interventions/ Pharmacotherapy Previous Bariatric surgery: none Pharmacotherapy for weight loss: She is currently prescribed Mounjaro  7.5 mg once weekly for medical weight loss.    Insurance does not pay for Mounjaro , as pt does not have an A1c of over 6.5. Ran out of Mounjaro  about a week ago. Denies side effects. Start Zepbound  7.5 mg once weekly.  We discussed various medication options to help [name] with her weight loss efforts and we both agreed to : See Pharmacotherapy note.   B) OBESITY RELATED CONDITIONS ADDRESSED TODAY:  Insulin  resistance with polyphagia and carb cravings Assessment & Plan Lab Results  Component Value Date   HGBA1C 5.2 11/10/2023   HGBA1C 5.3 03/19/2023   HGBA1C 5.2 04/23/2022   INSULIN  11.5 11/10/2023   INSULIN  8.3 09/11/2022   INSULIN  16.5 04/23/2022  Managed with dietary and lifestyle interventions. Hunger and cravings are  well-controlled. No acute concerns today. Cont decreasing simple carbs/sugars, increasing lean proteins and exercise to promote weight loss, improve glycemic control and prevent progression to Pre-DM.     Essential hypertension Assessment & Plan Last 3 blood pressure readings in our office are as follows: BP Readings from Last 3 Encounters:  04/04/24 105/67  03/03/24 118/72  02/29/24 108/68   The ASCVD Risk score (Arnett DK, et al., 2019) failed to calculate for the following reasons:   Risk score cannot be calculated because patient has a medical history suggesting prior/existing ASCVD   * - Cholesterol units were assumed  Lab Results  Component Value Date   CREATININE 1.35 (H) 03/03/2024  Currently on lasix  20 mg once daily, Imdur  30 g once daily, and Toprol -XL 50 mg once daily with good compliance and tolerance. BP is slightly low today at 105/67. Pt asx. No acute concerns. Cont low-sodium, heart-healthy diet. Cont medication. F/u with PCP as needed.    Vitamin D  deficiency Assessment & Plan Lab Results  Component Value Date   VD25OH 49.2 11/10/2023   VD25OH 36.7 08/06/2023   VD25OH 90.6 03/19/2023  Currently on Ergo 50K units once every 10 days with good compliance and tolerance. No acute concerns. Cont regimen. Recheck as necessary.     Medications Discontinued During This Encounter  Medication Reason   tirzepatide  (MOUNJARO ) 7.5 MG/0.5ML Pen      Meds ordered this encounter  Medications   tirzepatide  (ZEPBOUND ) 7.5 MG/0.5ML Pen    Sig: Inject 7.5 mg into the skin once a week.    Dispense:  2 mL    Refill:  1      Follow up:   Return 05/16/2024 4:00 PM.  She was informed of the importance of frequent follow up visits to maximize her success with intensive lifestyle modifications for her multiple health conditions.   Weight Summary and Biometrics   Weight Lost Since Last Visit: 1lb  Weight Gained Since Last Visit: 0lb    Vitals Temp: (!) 97.4 F (36.3  C) BP: 105/67 Pulse Rate: 60 SpO2: 90 %   Anthropometric Measurements Height: 5' 4.5 (1.638 m) Weight: 206 lb (93.4 kg) BMI (Calculated): 34.83 Weight at Last Visit: 207lb Weight Lost Since Last Visit: 1lb Weight Gained Since Last Visit: 0lb Starting Weight: 258lb Total Weight Loss (lbs): 52 lb (23.6 kg)   Body Composition  Body Fat %: 49.8 % Fat Mass (lbs): 103 lbs Muscle Mass (lbs): 98.6 lbs Total Body Water (lbs): 73 lbs Visceral Fat Rating : 15   Other Clinical Data Fasting: no Labs: no Today's Visit #: 35 Starting Date: 04/09/21    Objective:   PHYSICAL EXAM: Blood pressure 105/67, pulse 60, temperature (!) 97.4 F (36.3 C), height 5' 4.5 (1.638 m), weight 206 lb (93.4 kg), SpO2 90%. Body mass index is 34.81 kg/m.  General: she is overweight, cooperative and in no acute distress. PSYCH: Has normal mood, affect and thought process.   HEENT: EOMI, sclerae are  anicteric. Lungs: Normal breathing effort, no conversational dyspnea. Extremities: Moves * 4 Neurologic: A and O * 3, good insight  DIAGNOSTIC DATA REVIEWED: BMET    Component Value Date/Time   NA 137 03/03/2024 0728   NA 141 03/19/2023 0000   K 4.1 03/03/2024 0728   CL 105 03/03/2024 0728   CO2 19 (L) 03/03/2024 0728   GLUCOSE 67 (L) 03/03/2024 0728   BUN 21 03/03/2024 0728   BUN 18 03/19/2023 0000   CREATININE 1.35 (H) 03/03/2024 0728   CREATININE 1.17 (H) 02/22/2021 1501   CALCIUM  8.7 (L) 03/03/2024 0728   GFRNONAA 43 (L) 03/03/2024 0728   GFRAA 52 (L) 08/04/2018 0921   Lab Results  Component Value Date   HGBA1C 5.2 11/10/2023   HGBA1C 5.6 01/31/2017   Lab Results  Component Value Date   INSULIN  11.5 11/10/2023   INSULIN  17.2 04/09/2021   Lab Results  Component Value Date   TSH 0.86 01/27/2024   CBC    Component Value Date/Time   WBC 1.9 (L) 03/03/2024 0728   RBC 3.97 03/03/2024 0728   HGB 13.1 03/03/2024 0728   HCT 38.7 03/03/2024 0728   PLT 194 03/03/2024 0728    MCV 97.5 03/03/2024 0728   MCH 33.0 03/03/2024 0728   MCHC 33.9 03/03/2024 0728   RDW 16.7 (H) 03/03/2024 0728   Iron Studies    Component Value Date/Time   IRON 68 01/27/2024 1409   IRON 386 06/23/2017 0000   TIBC 320.6 01/27/2024 1409   TIBC 285 06/23/2017 0000   FERRITIN 73.5 01/27/2024 1409   FERRITIN 58 06/23/2017 0000   IRONPCTSAT 21.2 01/27/2024 1409   IRONPCTSAT 26 06/23/2017 0000   Lipid Panel     Component Value Date/Time   CHOL 130 01/27/2024 1409   CHOL 134 04/23/2022 1456   TRIG 50.0 01/27/2024 1409   HDL 55.20 01/27/2024 1409   HDL 55 04/23/2022 1456   CHOLHDL 2 01/27/2024 1409   VLDL 10.0 01/27/2024 1409   LDLCALC 65 01/27/2024 1409   LDLCALC 67 04/23/2022 1456   LDLCALC 68 11/14/2019 1537   LDLDIRECT 321.8 02/27/2012 1202   Hepatic Function Panel     Component Value Date/Time   PROT 7.2 01/27/2024 1409   PROT 7.2 04/23/2022 1456   ALBUMIN  3.8 01/27/2024 1409   ALBUMIN  4.2 04/23/2022 1456   AST 18 01/27/2024 1409   ALT 8 01/27/2024 1409   ALKPHOS 84 01/27/2024 1409   BILITOT 0.5 01/27/2024 1409   BILITOT 0.4 04/23/2022 1456   BILIDIR 0.1 01/27/2024 1409   BILIDIR 0.10 11/03/2016 1155   IBILI 0.2 11/14/2019 1537      Component Value Date/Time   TSH 0.86 01/27/2024 1409   Nutritional Lab Results  Component Value Date   VD25OH 49.2 11/10/2023   VD25OH 36.7 08/06/2023   VD25OH 90.6 03/19/2023    Attestations:   I, ***, acting as a stage manager for Barnie Jenkins, DO., have compiled all relevant documentation for today's office visit on behalf of Barnie Jenkins, DO, while in the presence of [employer], DO.   I have reviewed the above documentation for accuracy and completeness, and I agree with the above. Barnie [name], D.O.  The 21st Century Cures Act was signed into law in 2016 which includes the topic of electronic health records.  This provides immediate access to information in MyChart.  This includes consultation notes,  operative notes, office notes, lab results and pathology reports.  If you have any questions  about what you read please let us  know at your next visit so we can discuss your concerns and take corrective action if need be.  We are right here with you.  "

## 2024-04-06 ENCOUNTER — Telehealth: Payer: Self-pay | Admitting: Orthopedic Surgery

## 2024-04-06 ENCOUNTER — Inpatient Hospital Stay: Admitting: Internal Medicine

## 2024-04-06 NOTE — Telephone Encounter (Signed)
 Ms. Evans states that she has bought an Retail buyer.  She would like Dr. Vernetta to write a prescription for it and date the prescription 08/20/23.  She will pick it up when it is ready. Call back # is [phone].

## 2024-04-08 ENCOUNTER — Encounter (INDEPENDENT_AMBULATORY_CARE_PROVIDER_SITE_OTHER): Payer: Self-pay | Admitting: Family Medicine

## 2024-04-13 NOTE — Telephone Encounter (Signed)
 Call to patient.  Patient was able to pick up her Zepbound  prescription. Just needed to close this phone call.  No further needs.  Call ended.

## 2024-04-18 ENCOUNTER — Inpatient Hospital Stay: Admitting: Internal Medicine

## 2024-05-16 ENCOUNTER — Ambulatory Visit (INDEPENDENT_AMBULATORY_CARE_PROVIDER_SITE_OTHER): Admitting: Family Medicine

## 2024-05-31 ENCOUNTER — Ambulatory Visit: Admitting: Cardiology

## 2024-06-03 ENCOUNTER — Ambulatory Visit

## 2024-06-16 ENCOUNTER — Ambulatory Visit

## 2024-09-05 ENCOUNTER — Ambulatory Visit: Admitting: Orthopaedic Surgery
